# Patient Record
Sex: Male | Born: 1940 | Race: White | Hispanic: No | Marital: Married | Smoking: Never smoker
Health system: Southern US, Community
[De-identification: ages and names within clinical notes are randomized; demographics above are authoritative.]

## PROBLEM LIST (undated history)

## (undated) DIAGNOSIS — K219 Gastro-esophageal reflux disease without esophagitis: Secondary | ICD-10-CM

## (undated) DIAGNOSIS — Z952 Presence of prosthetic heart valve: Secondary | ICD-10-CM

## (undated) DIAGNOSIS — R519 Headache, unspecified: Secondary | ICD-10-CM

## (undated) DIAGNOSIS — Z9889 Other specified postprocedural states: Secondary | ICD-10-CM

## (undated) DIAGNOSIS — I499 Cardiac arrhythmia, unspecified: Secondary | ICD-10-CM

## (undated) DIAGNOSIS — I10 Essential (primary) hypertension: Secondary | ICD-10-CM

## (undated) DIAGNOSIS — I4891 Unspecified atrial fibrillation: Secondary | ICD-10-CM

## (undated) DIAGNOSIS — I2510 Atherosclerotic heart disease of native coronary artery without angina pectoris: Secondary | ICD-10-CM

## (undated) DIAGNOSIS — Z9289 Personal history of other medical treatment: Secondary | ICD-10-CM

## (undated) DIAGNOSIS — I350 Nonrheumatic aortic (valve) stenosis: Secondary | ICD-10-CM

## (undated) DIAGNOSIS — I2119 ST elevation (STEMI) myocardial infarction involving other coronary artery of inferior wall: Secondary | ICD-10-CM

## (undated) DIAGNOSIS — L989 Disorder of the skin and subcutaneous tissue, unspecified: Secondary | ICD-10-CM

## (undated) DIAGNOSIS — E785 Hyperlipidemia, unspecified: Secondary | ICD-10-CM

## (undated) DIAGNOSIS — R51 Headache: Secondary | ICD-10-CM

## (undated) HISTORY — PX: EYE SURGERY: SHX253

## (undated) HISTORY — DX: Gastro-esophageal reflux disease without esophagitis: K21.9

## (undated) HISTORY — PX: CATARACT EXTRACTION W/ INTRAOCULAR LENS IMPLANT: SHX1309

## (undated) HISTORY — DX: ST elevation (STEMI) myocardial infarction involving other coronary artery of inferior wall: I21.19

## (undated) HISTORY — DX: Disorder of the skin and subcutaneous tissue, unspecified: L98.9

## (undated) HISTORY — PX: CHOLECYSTECTOMY: SHX55

## (undated) HISTORY — DX: Atherosclerotic heart disease of native coronary artery without angina pectoris: I25.10

## (undated) HISTORY — DX: Hyperlipidemia, unspecified: E78.5

## (undated) HISTORY — DX: Personal history of other medical treatment: Z92.89

## (undated) HISTORY — DX: Essential (primary) hypertension: I10

## (undated) HISTORY — DX: Other specified postprocedural states: Z98.890

## (undated) HISTORY — DX: Nonrheumatic aortic (valve) stenosis: I35.0

## (undated) HISTORY — PX: TONSILLECTOMY: SUR1361

## (undated) HISTORY — PX: CARDIAC CATHETERIZATION: SHX172

## (undated) HISTORY — DX: Unspecified atrial fibrillation: I48.91

---

## 1978-12-05 DIAGNOSIS — I2119 ST elevation (STEMI) myocardial infarction involving other coronary artery of inferior wall: Secondary | ICD-10-CM

## 1978-12-05 HISTORY — DX: ST elevation (STEMI) myocardial infarction involving other coronary artery of inferior wall: I21.19

## 1996-02-09 DIAGNOSIS — Z951 Presence of aortocoronary bypass graft: Secondary | ICD-10-CM | POA: Insufficient documentation

## 1996-02-09 HISTORY — PX: CORONARY ARTERY BYPASS GRAFT: SHX141

## 2000-09-14 ENCOUNTER — Encounter (INDEPENDENT_AMBULATORY_CARE_PROVIDER_SITE_OTHER): Payer: Self-pay | Admitting: *Deleted

## 2003-12-03 ENCOUNTER — Emergency Department (HOSPITAL_COMMUNITY): Admission: EM | Admit: 2003-12-03 | Discharge: 2003-12-03 | Payer: Self-pay | Admitting: Emergency Medicine

## 2004-10-15 ENCOUNTER — Ambulatory Visit: Payer: Self-pay | Admitting: Internal Medicine

## 2004-10-29 ENCOUNTER — Ambulatory Visit: Payer: Self-pay | Admitting: Cardiology

## 2004-11-02 ENCOUNTER — Ambulatory Visit: Payer: Self-pay

## 2004-11-15 ENCOUNTER — Ambulatory Visit: Payer: Self-pay | Admitting: Cardiology

## 2005-01-18 ENCOUNTER — Ambulatory Visit: Payer: Self-pay | Admitting: Cardiology

## 2005-10-05 ENCOUNTER — Ambulatory Visit: Payer: Self-pay | Admitting: Cardiology

## 2005-10-05 ENCOUNTER — Ambulatory Visit: Payer: Self-pay

## 2005-10-12 ENCOUNTER — Ambulatory Visit: Payer: Self-pay | Admitting: Cardiology

## 2005-10-17 ENCOUNTER — Ambulatory Visit: Payer: Self-pay

## 2005-10-17 ENCOUNTER — Ambulatory Visit: Payer: Self-pay | Admitting: Cardiology

## 2005-10-17 ENCOUNTER — Encounter: Payer: Self-pay | Admitting: Cardiology

## 2005-10-24 ENCOUNTER — Ambulatory Visit: Payer: Self-pay | Admitting: Cardiology

## 2005-11-01 ENCOUNTER — Ambulatory Visit: Payer: Self-pay | Admitting: *Deleted

## 2005-11-08 ENCOUNTER — Ambulatory Visit: Payer: Self-pay | Admitting: Cardiology

## 2005-11-15 ENCOUNTER — Ambulatory Visit: Payer: Self-pay | Admitting: Cardiology

## 2005-11-18 ENCOUNTER — Ambulatory Visit: Payer: Self-pay | Admitting: Cardiology

## 2005-11-18 ENCOUNTER — Inpatient Hospital Stay (HOSPITAL_COMMUNITY): Admission: AD | Admit: 2005-11-18 | Discharge: 2005-11-21 | Payer: Self-pay | Admitting: Cardiology

## 2005-12-02 ENCOUNTER — Ambulatory Visit: Payer: Self-pay | Admitting: Cardiology

## 2005-12-08 ENCOUNTER — Ambulatory Visit: Payer: Self-pay | Admitting: Internal Medicine

## 2005-12-08 ENCOUNTER — Ambulatory Visit: Payer: Self-pay | Admitting: Cardiology

## 2005-12-28 ENCOUNTER — Ambulatory Visit: Payer: Self-pay | Admitting: Cardiology

## 2006-01-11 ENCOUNTER — Ambulatory Visit: Payer: Self-pay | Admitting: Cardiology

## 2006-01-13 ENCOUNTER — Ambulatory Visit: Payer: Self-pay | Admitting: Internal Medicine

## 2006-02-16 ENCOUNTER — Ambulatory Visit: Payer: Self-pay | Admitting: Internal Medicine

## 2006-02-21 ENCOUNTER — Encounter: Payer: Self-pay | Admitting: Internal Medicine

## 2006-02-21 ENCOUNTER — Ambulatory Visit: Payer: Self-pay

## 2006-03-21 ENCOUNTER — Ambulatory Visit: Payer: Self-pay | Admitting: Cardiology

## 2006-04-26 ENCOUNTER — Ambulatory Visit: Payer: Self-pay | Admitting: Internal Medicine

## 2006-05-03 ENCOUNTER — Ambulatory Visit: Payer: Self-pay | Admitting: Internal Medicine

## 2006-05-10 ENCOUNTER — Ambulatory Visit: Payer: Self-pay | Admitting: Internal Medicine

## 2006-09-05 ENCOUNTER — Ambulatory Visit: Payer: Self-pay | Admitting: Internal Medicine

## 2006-09-14 ENCOUNTER — Ambulatory Visit: Payer: Self-pay | Admitting: Cardiology

## 2006-09-14 LAB — CONVERTED CEMR LAB
ALT: 44 units/L — ABNORMAL HIGH (ref 0–40)
AST: 35 units/L (ref 0–37)
Albumin: 3.9 g/dL (ref 3.5–5.2)
Alkaline Phosphatase: 107 units/L (ref 39–117)
BUN: 13 mg/dL (ref 6–23)
Bilirubin, Direct: 0.2 mg/dL (ref 0.0–0.3)
CO2: 30 meq/L (ref 19–32)
Calcium: 9.3 mg/dL (ref 8.4–10.5)
Chloride: 107 meq/L (ref 96–112)
Chol/HDL Ratio, serum: 4
Cholesterol: 149 mg/dL (ref 0–200)
Creatinine, Ser: 1.1 mg/dL (ref 0.4–1.5)
GFR calc non Af Amer: 71 mL/min
Glomerular Filtration Rate, Af Am: 86 mL/min/{1.73_m2}
Glucose, Bld: 101 mg/dL — ABNORMAL HIGH (ref 70–99)
HCT: 45.6 % (ref 39.0–52.0)
HDL: 37.4 mg/dL — ABNORMAL LOW (ref >39.0)
Hemoglobin: 15.4 g/dL (ref 13.0–17.0)
LDL Cholesterol: 92 mg/dL (ref 0–99)
MCHC: 33.8 g/dL (ref 30.0–36.0)
MCV: 97.9 fL (ref 78.0–100.0)
Platelets: 143 10*3/uL — ABNORMAL LOW (ref 150–400)
Potassium: 5.1 meq/L (ref 3.5–5.1)
RBC: 4.65 M/uL (ref 4.22–5.81)
RDW: 12.3 % (ref 11.5–14.6)
Sodium: 141 meq/L (ref 135–145)
TSH: 0.59 microintl units/mL (ref 0.35–5.50)
Total Bilirubin: 0.9 mg/dL (ref 0.3–1.2)
Total Protein: 6.8 g/dL (ref 6.0–8.3)
Triglyceride fasting, serum: 96 mg/dL (ref 0–149)
VLDL: 19 mg/dL (ref 0–40)
WBC: 7.3 10*3/uL (ref 4.5–10.5)

## 2006-09-18 ENCOUNTER — Ambulatory Visit: Payer: Self-pay | Admitting: Cardiology

## 2006-10-11 ENCOUNTER — Ambulatory Visit: Payer: Self-pay

## 2006-11-18 HISTORY — PX: CARDIOVERSION: SHX1299

## 2006-11-20 ENCOUNTER — Ambulatory Visit: Payer: Self-pay | Admitting: Cardiology

## 2006-11-20 LAB — CONVERTED CEMR LAB
ALT: 35 units/L (ref 0–40)
AST: 30 units/L (ref 0–37)
Albumin: 3.7 g/dL (ref 3.5–5.2)
Alkaline Phosphatase: 106 units/L (ref 39–117)
BUN: 11 mg/dL (ref 6–23)
Bilirubin, Direct: 0.2 mg/dL (ref 0.0–0.3)
CO2: 27 meq/L (ref 19–32)
Calcium: 9.2 mg/dL (ref 8.4–10.5)
Chloride: 107 meq/L (ref 96–112)
Chol/HDL Ratio, serum: 3.2
Cholesterol: 139 mg/dL (ref 0–200)
Creatinine, Ser: 1 mg/dL (ref 0.4–1.5)
GFR calc non Af Amer: 79 mL/min
Glomerular Filtration Rate, Af Am: 96 mL/min/{1.73_m2}
Glucose, Bld: 105 mg/dL — ABNORMAL HIGH (ref 70–99)
HCT: 44.6 % (ref 39.0–52.0)
HDL: 42.8 mg/dL (ref >39.0)
Hemoglobin: 15 g/dL (ref 13.0–17.0)
LDL Cholesterol: 83 mg/dL (ref 0–99)
MCHC: 33.5 g/dL (ref 30.0–36.0)
MCV: 99.2 fL (ref 78.0–100.0)
Platelets: 152 10*3/uL (ref 150–400)
Potassium: 4.5 meq/L (ref 3.5–5.1)
RBC: 4.5 M/uL (ref 4.22–5.81)
RDW: 12.6 % (ref 11.5–14.6)
Sodium: 140 meq/L (ref 135–145)
TSH: 1.1 microintl units/mL (ref 0.35–5.50)
Total Bilirubin: 1.1 mg/dL (ref 0.3–1.2)
Total Protein: 6.6 g/dL (ref 6.0–8.3)
Triglyceride fasting, serum: 67 mg/dL (ref 0–149)
VLDL: 13 mg/dL (ref 0–40)
WBC: 6.6 10*3/uL (ref 4.5–10.5)

## 2007-02-06 ENCOUNTER — Ambulatory Visit: Payer: Self-pay | Admitting: Cardiology

## 2007-03-07 ENCOUNTER — Ambulatory Visit: Payer: Self-pay | Admitting: Cardiology

## 2007-04-05 ENCOUNTER — Encounter: Payer: Self-pay | Admitting: Internal Medicine

## 2007-04-05 ENCOUNTER — Ambulatory Visit: Payer: Self-pay

## 2007-04-09 ENCOUNTER — Ambulatory Visit: Payer: Self-pay | Admitting: Pulmonary Disease

## 2007-04-13 ENCOUNTER — Ambulatory Visit: Payer: Self-pay | Admitting: Internal Medicine

## 2007-04-24 ENCOUNTER — Ambulatory Visit: Payer: Self-pay | Admitting: Cardiology

## 2007-04-30 ENCOUNTER — Ambulatory Visit (HOSPITAL_BASED_OUTPATIENT_CLINIC_OR_DEPARTMENT_OTHER): Admission: RE | Admit: 2007-04-30 | Discharge: 2007-04-30 | Payer: Self-pay | Admitting: Pulmonary Disease

## 2007-04-30 ENCOUNTER — Ambulatory Visit: Payer: Self-pay | Admitting: Pulmonary Disease

## 2007-05-10 ENCOUNTER — Ambulatory Visit: Payer: Self-pay | Admitting: Cardiology

## 2007-05-22 ENCOUNTER — Ambulatory Visit: Payer: Self-pay | Admitting: Pulmonary Disease

## 2007-05-31 ENCOUNTER — Ambulatory Visit: Payer: Self-pay | Admitting: Cardiology

## 2007-05-31 LAB — CONVERTED CEMR LAB
BUN: 13 mg/dL (ref 6–23)
CO2: 28 meq/L (ref 19–32)
Calcium: 9 mg/dL (ref 8.4–10.5)
Chloride: 109 meq/L (ref 96–112)
Creatinine, Ser: 0.9 mg/dL (ref 0.4–1.5)
GFR calc Af Amer: 108 mL/min
GFR calc non Af Amer: 89 mL/min
Glucose, Bld: 147 mg/dL — ABNORMAL HIGH (ref 70–99)
Potassium: 4.3 meq/L (ref 3.5–5.1)
Sodium: 143 meq/L (ref 135–145)

## 2007-08-21 ENCOUNTER — Encounter: Payer: Self-pay | Admitting: Internal Medicine

## 2007-09-06 ENCOUNTER — Ambulatory Visit: Payer: Self-pay | Admitting: Cardiology

## 2007-09-07 ENCOUNTER — Encounter: Payer: Self-pay | Admitting: *Deleted

## 2007-09-07 DIAGNOSIS — K219 Gastro-esophageal reflux disease without esophagitis: Secondary | ICD-10-CM | POA: Insufficient documentation

## 2007-09-07 DIAGNOSIS — I359 Nonrheumatic aortic valve disorder, unspecified: Secondary | ICD-10-CM

## 2007-09-07 DIAGNOSIS — I10 Essential (primary) hypertension: Secondary | ICD-10-CM | POA: Insufficient documentation

## 2007-09-07 DIAGNOSIS — I482 Chronic atrial fibrillation, unspecified: Secondary | ICD-10-CM | POA: Insufficient documentation

## 2007-09-25 ENCOUNTER — Encounter: Payer: Self-pay | Admitting: Internal Medicine

## 2007-10-17 ENCOUNTER — Ambulatory Visit: Payer: Self-pay

## 2007-10-17 LAB — CONVERTED CEMR LAB
ALT: 35 units/L (ref 0–53)
AST: 31 units/L (ref 0–37)
Albumin: 3.8 g/dL (ref 3.5–5.2)
Alkaline Phosphatase: 97 units/L (ref 39–117)
Bilirubin, Direct: 0.1 mg/dL (ref 0.0–0.3)
Cholesterol: 125 mg/dL (ref 0–200)
HDL: 45 mg/dL (ref >39.0)
LDL Cholesterol: 67 mg/dL (ref 0–99)
Total Bilirubin: 0.9 mg/dL (ref 0.3–1.2)
Total CHOL/HDL Ratio: 2.8
Total Protein: 6.8 g/dL (ref 6.0–8.3)
Triglycerides: 67 mg/dL (ref 0–149)
VLDL: 13 mg/dL (ref 0–40)

## 2007-10-19 ENCOUNTER — Ambulatory Visit: Payer: Self-pay | Admitting: Internal Medicine

## 2007-10-24 ENCOUNTER — Ambulatory Visit: Payer: Self-pay | Admitting: Internal Medicine

## 2007-10-24 ENCOUNTER — Ambulatory Visit: Payer: Self-pay | Admitting: Cardiology

## 2007-10-30 ENCOUNTER — Ambulatory Visit: Payer: Self-pay | Admitting: Internal Medicine

## 2007-10-30 ENCOUNTER — Telehealth: Payer: Self-pay | Admitting: Internal Medicine

## 2007-11-04 ENCOUNTER — Encounter: Payer: Self-pay | Admitting: Internal Medicine

## 2007-11-09 ENCOUNTER — Telehealth: Payer: Self-pay | Admitting: Internal Medicine

## 2007-11-15 ENCOUNTER — Ambulatory Visit: Payer: Self-pay | Admitting: Cardiology

## 2007-11-22 ENCOUNTER — Ambulatory Visit: Payer: Self-pay | Admitting: Cardiology

## 2007-12-04 ENCOUNTER — Ambulatory Visit: Payer: Self-pay | Admitting: Internal Medicine

## 2007-12-13 ENCOUNTER — Ambulatory Visit: Payer: Self-pay | Admitting: Internal Medicine

## 2008-01-03 ENCOUNTER — Ambulatory Visit: Payer: Self-pay | Admitting: Cardiology

## 2008-01-17 ENCOUNTER — Ambulatory Visit: Payer: Self-pay | Admitting: Cardiology

## 2008-02-14 ENCOUNTER — Ambulatory Visit: Payer: Self-pay | Admitting: Cardiology

## 2008-03-06 ENCOUNTER — Ambulatory Visit: Payer: Self-pay | Admitting: Internal Medicine

## 2008-03-20 ENCOUNTER — Ambulatory Visit: Payer: Self-pay | Admitting: Cardiology

## 2008-04-07 ENCOUNTER — Ambulatory Visit: Payer: Self-pay | Admitting: Internal Medicine

## 2008-04-07 ENCOUNTER — Ambulatory Visit: Payer: Self-pay | Admitting: Cardiology

## 2008-04-07 ENCOUNTER — Ambulatory Visit: Payer: Self-pay

## 2008-04-07 ENCOUNTER — Encounter: Payer: Self-pay | Admitting: Cardiology

## 2008-04-07 LAB — CONVERTED CEMR LAB
ALT: 37 units/L (ref 0–53)
AST: 32 units/L (ref 0–37)
Albumin: 3.8 g/dL (ref 3.5–5.2)
Alkaline Phosphatase: 115 units/L (ref 39–117)
BUN: 15 mg/dL (ref 6–23)
Bilirubin, Direct: 0.1 mg/dL (ref 0.0–0.3)
CO2: 31 meq/L (ref 19–32)
Calcium: 9.1 mg/dL (ref 8.4–10.5)
Chloride: 106 meq/L (ref 96–112)
Cholesterol: 141 mg/dL (ref 0–200)
Creatinine, Ser: 1 mg/dL (ref 0.4–1.5)
GFR calc Af Amer: 95 mL/min
GFR calc non Af Amer: 79 mL/min
Glucose, Bld: 99 mg/dL (ref 70–99)
HDL: 43.3 mg/dL (ref >39.0)
LDL Cholesterol: 83 mg/dL (ref 0–99)
Potassium: 4.1 meq/L (ref 3.5–5.1)
Sodium: 141 meq/L (ref 135–145)
TSH: 0.92 microintl units/mL (ref 0.35–5.50)
Total Bilirubin: 0.8 mg/dL (ref 0.3–1.2)
Total CHOL/HDL Ratio: 3.3
Total Protein: 6.8 g/dL (ref 6.0–8.3)
Triglycerides: 73 mg/dL (ref 0–149)
VLDL: 15 mg/dL (ref 0–40)

## 2008-04-16 ENCOUNTER — Ambulatory Visit: Payer: Self-pay | Admitting: Cardiology

## 2008-04-21 ENCOUNTER — Encounter: Payer: Self-pay | Admitting: Internal Medicine

## 2008-05-05 ENCOUNTER — Ambulatory Visit: Payer: Self-pay | Admitting: Cardiovascular Disease

## 2008-06-02 ENCOUNTER — Ambulatory Visit: Payer: Self-pay | Admitting: Cardiology

## 2008-06-30 ENCOUNTER — Ambulatory Visit: Payer: Self-pay | Admitting: Cardiovascular Disease

## 2008-07-29 ENCOUNTER — Ambulatory Visit: Payer: Self-pay | Admitting: Cardiology

## 2008-08-21 ENCOUNTER — Encounter: Payer: Self-pay | Admitting: Internal Medicine

## 2008-08-26 ENCOUNTER — Ambulatory Visit: Payer: Self-pay | Admitting: Cardiology

## 2008-09-16 ENCOUNTER — Ambulatory Visit: Payer: Self-pay | Admitting: Internal Medicine

## 2008-09-26 ENCOUNTER — Ambulatory Visit: Payer: Self-pay | Admitting: Internal Medicine

## 2008-10-10 ENCOUNTER — Ambulatory Visit: Payer: Self-pay | Admitting: Cardiology

## 2008-10-10 ENCOUNTER — Ambulatory Visit: Payer: Self-pay

## 2008-10-10 LAB — CONVERTED CEMR LAB
ALT: 41 units/L (ref 0–53)
AST: 34 units/L (ref 0–37)
Albumin: 3.9 g/dL (ref 3.5–5.2)
Alkaline Phosphatase: 130 units/L — ABNORMAL HIGH (ref 39–117)
BUN: 14 mg/dL (ref 6–23)
Basophils Absolute: 0.2 10*3/uL — ABNORMAL HIGH (ref 0.0–0.1)
Basophils Relative: 2.8 % (ref 0.0–3.0)
Bilirubin, Direct: 0.2 mg/dL (ref 0.0–0.3)
CO2: 32 meq/L (ref 19–32)
Calcium: 9.4 mg/dL (ref 8.4–10.5)
Chloride: 105 meq/L (ref 96–112)
Cholesterol: 151 mg/dL (ref 0–200)
Creatinine, Ser: 0.9 mg/dL (ref 0.4–1.5)
Eosinophils Absolute: 0.1 10*3/uL (ref 0.0–0.7)
Eosinophils Relative: 1.7 % (ref 0.0–5.0)
GFR calc Af Amer: 108 mL/min
GFR calc non Af Amer: 89 mL/min
Glucose, Bld: 99 mg/dL (ref 70–99)
HCT: 45.5 % (ref 39.0–52.0)
HDL: 49.5 mg/dL (ref >39.0)
Hemoglobin: 15.5 g/dL (ref 13.0–17.0)
LDL Cholesterol: 86 mg/dL (ref 0–99)
Lymphocytes Relative: 20 % (ref 12.0–46.0)
MCHC: 34.1 g/dL (ref 30.0–36.0)
MCV: 100.3 fL — ABNORMAL HIGH (ref 78.0–100.0)
Monocytes Absolute: 0.7 10*3/uL (ref 0.1–1.0)
Monocytes Relative: 10.9 % (ref 3.0–12.0)
Neutro Abs: 4 10*3/uL (ref 1.4–7.7)
Neutrophils Relative %: 64.6 % (ref 43.0–77.0)
Platelets: 109 10*3/uL — ABNORMAL LOW (ref 150–400)
Potassium: 5 meq/L (ref 3.5–5.1)
RBC: 4.54 M/uL (ref 4.22–5.81)
RDW: 12.9 % (ref 11.5–14.6)
Sodium: 141 meq/L (ref 135–145)
TSH: 1.29 microintl units/mL (ref 0.35–5.50)
Total Bilirubin: 1.1 mg/dL (ref 0.3–1.2)
Total CHOL/HDL Ratio: 3.1
Total Protein: 7.1 g/dL (ref 6.0–8.3)
Triglycerides: 79 mg/dL (ref 0–149)
VLDL: 16 mg/dL (ref 0–40)
WBC: 6.2 10*3/uL (ref 4.5–10.5)

## 2008-10-16 ENCOUNTER — Ambulatory Visit: Payer: Self-pay | Admitting: Cardiology

## 2008-10-16 DIAGNOSIS — I2510 Atherosclerotic heart disease of native coronary artery without angina pectoris: Secondary | ICD-10-CM | POA: Insufficient documentation

## 2008-10-16 DIAGNOSIS — I25810 Atherosclerosis of coronary artery bypass graft(s) without angina pectoris: Secondary | ICD-10-CM

## 2008-11-04 ENCOUNTER — Ambulatory Visit: Payer: Self-pay | Admitting: Cardiovascular Disease

## 2008-11-25 ENCOUNTER — Ambulatory Visit: Payer: Self-pay | Admitting: Internal Medicine

## 2008-12-16 ENCOUNTER — Ambulatory Visit: Payer: Self-pay | Admitting: Cardiovascular Disease

## 2008-12-22 ENCOUNTER — Ambulatory Visit: Payer: Self-pay | Admitting: Internal Medicine

## 2008-12-29 ENCOUNTER — Telehealth: Payer: Self-pay | Admitting: Internal Medicine

## 2009-01-02 ENCOUNTER — Ambulatory Visit: Payer: Self-pay | Admitting: Cardiovascular Disease

## 2009-01-06 ENCOUNTER — Ambulatory Visit: Payer: Self-pay | Admitting: Cardiology

## 2009-01-07 ENCOUNTER — Telehealth: Payer: Self-pay | Admitting: Internal Medicine

## 2009-01-27 ENCOUNTER — Ambulatory Visit: Payer: Self-pay | Admitting: Cardiovascular Disease

## 2009-01-28 ENCOUNTER — Telehealth: Payer: Self-pay | Admitting: Internal Medicine

## 2009-02-24 ENCOUNTER — Ambulatory Visit: Payer: Self-pay | Admitting: Cardiovascular Disease

## 2009-03-17 ENCOUNTER — Ambulatory Visit: Payer: Self-pay | Admitting: Cardiology

## 2009-03-18 ENCOUNTER — Telehealth: Payer: Self-pay | Admitting: Cardiology

## 2009-03-31 ENCOUNTER — Ambulatory Visit: Payer: Self-pay | Admitting: Cardiology

## 2009-04-10 ENCOUNTER — Ambulatory Visit: Payer: Self-pay | Admitting: Cardiovascular Disease

## 2009-04-10 ENCOUNTER — Encounter: Payer: Self-pay | Admitting: Cardiology

## 2009-04-10 ENCOUNTER — Ambulatory Visit: Payer: Self-pay

## 2009-04-13 ENCOUNTER — Ambulatory Visit: Payer: Self-pay | Admitting: Cardiology

## 2009-04-13 LAB — CONVERTED CEMR LAB
ALT: 40 units/L (ref 0–53)
AST: 26 units/L (ref 0–37)
Albumin: 3.4 g/dL — ABNORMAL LOW (ref 3.5–5.2)
Alkaline Phosphatase: 126 units/L — ABNORMAL HIGH (ref 39–117)
BUN: 13 mg/dL (ref 6–23)
Basophils Absolute: 0 10*3/uL (ref 0.0–0.1)
Basophils Relative: 0.6 % (ref 0.0–3.0)
Bilirubin, Direct: 0.2 mg/dL (ref 0.0–0.3)
CO2: 27 meq/L (ref 19–32)
Calcium: 9.1 mg/dL (ref 8.4–10.5)
Chloride: 110 meq/L (ref 96–112)
Cholesterol: 116 mg/dL (ref 0–200)
Creatinine, Ser: 0.7 mg/dL (ref 0.4–1.5)
Eosinophils Absolute: 0.1 10*3/uL (ref 0.0–0.7)
Eosinophils Relative: 1.9 % (ref 0.0–5.0)
GFR calc non Af Amer: 118.44 mL/min (ref >60)
Glucose, Bld: 91 mg/dL (ref 70–99)
HCT: 39 % (ref 39.0–52.0)
HDL: 39 mg/dL — ABNORMAL LOW (ref >39.00)
Hemoglobin: 13.5 g/dL (ref 13.0–17.0)
LDL Cholesterol: 66 mg/dL (ref 0–99)
Lymphocytes Relative: 20.7 % (ref 12.0–46.0)
Lymphs Abs: 1.4 10*3/uL (ref 0.7–4.0)
MCHC: 34.5 g/dL (ref 30.0–36.0)
MCV: 95.6 fL (ref 78.0–100.0)
Monocytes Absolute: 0.7 10*3/uL (ref 0.1–1.0)
Monocytes Relative: 10.6 % (ref 3.0–12.0)
Neutro Abs: 4.7 10*3/uL (ref 1.4–7.7)
Neutrophils Relative %: 66.2 % (ref 43.0–77.0)
Platelets: 128 10*3/uL — ABNORMAL LOW (ref 150.0–400.0)
Potassium: 3.9 meq/L (ref 3.5–5.1)
RBC: 4.08 M/uL — ABNORMAL LOW (ref 4.22–5.81)
RDW: 12.1 % (ref 11.5–14.6)
Sodium: 142 meq/L (ref 135–145)
TSH: 0.07 microintl units/mL — ABNORMAL LOW (ref 0.35–5.50)
Total Bilirubin: 1.1 mg/dL (ref 0.3–1.2)
Total CHOL/HDL Ratio: 3
Total Protein: 6.2 g/dL (ref 6.0–8.3)
Triglycerides: 54 mg/dL (ref 0.0–149.0)
VLDL: 10.8 mg/dL (ref 0.0–40.0)
WBC: 6.9 10*3/uL (ref 4.5–10.5)

## 2009-04-22 ENCOUNTER — Ambulatory Visit: Payer: Self-pay | Admitting: Cardiology

## 2009-04-22 DIAGNOSIS — E785 Hyperlipidemia, unspecified: Secondary | ICD-10-CM

## 2009-04-22 DIAGNOSIS — I255 Ischemic cardiomyopathy: Secondary | ICD-10-CM | POA: Insufficient documentation

## 2009-04-22 DIAGNOSIS — E782 Mixed hyperlipidemia: Secondary | ICD-10-CM | POA: Insufficient documentation

## 2009-05-05 ENCOUNTER — Encounter: Payer: Self-pay | Admitting: *Deleted

## 2009-05-06 ENCOUNTER — Ambulatory Visit: Payer: Self-pay | Admitting: Cardiology

## 2009-05-06 LAB — CONVERTED CEMR LAB
POC INR: 2.3
Protime: 18.7

## 2009-05-22 ENCOUNTER — Telehealth: Payer: Self-pay | Admitting: Internal Medicine

## 2009-05-22 ENCOUNTER — Ambulatory Visit: Payer: Self-pay | Admitting: Internal Medicine

## 2009-05-22 DIAGNOSIS — G4700 Insomnia, unspecified: Secondary | ICD-10-CM

## 2009-05-22 DIAGNOSIS — E0590 Thyrotoxicosis, unspecified without thyrotoxic crisis or storm: Secondary | ICD-10-CM | POA: Insufficient documentation

## 2009-05-22 LAB — CONVERTED CEMR LAB
Free T4: 1.4 ng/dL (ref 0.6–1.6)
T3 Uptake Ratio: 41.1 % — ABNORMAL HIGH (ref 22.5–37.0)
T3, Free: 2.6 pg/mL (ref 2.3–4.2)
T4, Total: 10.9 ug/dL (ref 5.0–12.5)
TSH: 0.03 microintl units/mL — ABNORMAL LOW (ref 0.35–5.50)

## 2009-05-27 ENCOUNTER — Ambulatory Visit: Payer: Self-pay | Admitting: Internal Medicine

## 2009-05-27 ENCOUNTER — Encounter: Payer: Self-pay | Admitting: Internal Medicine

## 2009-05-27 LAB — CONVERTED CEMR LAB
POC INR: 1.5
Prothrombin Time: 15.2 s

## 2009-06-10 ENCOUNTER — Encounter: Payer: Self-pay | Admitting: *Deleted

## 2009-06-17 ENCOUNTER — Ambulatory Visit: Payer: Self-pay | Admitting: Internal Medicine

## 2009-06-17 LAB — CONVERTED CEMR LAB
POC INR: 1.1
Prothrombin Time: 13 s

## 2009-06-25 ENCOUNTER — Ambulatory Visit: Payer: Self-pay | Admitting: Cardiovascular Disease

## 2009-07-09 ENCOUNTER — Ambulatory Visit: Payer: Self-pay | Admitting: Cardiology

## 2009-07-09 LAB — CONVERTED CEMR LAB
POC INR: 2.7
Prothrombin Time: 19.9 s

## 2009-07-30 ENCOUNTER — Ambulatory Visit: Payer: Self-pay | Admitting: Cardiovascular Disease

## 2009-07-30 LAB — CONVERTED CEMR LAB: POC INR: 4

## 2009-08-13 ENCOUNTER — Ambulatory Visit: Payer: Self-pay | Admitting: Cardiovascular Disease

## 2009-08-13 LAB — CONVERTED CEMR LAB: POC INR: 3.1

## 2009-08-27 ENCOUNTER — Ambulatory Visit: Payer: Self-pay | Admitting: Internal Medicine

## 2009-08-27 ENCOUNTER — Telehealth: Payer: Self-pay | Admitting: Cardiology

## 2009-08-27 LAB — CONVERTED CEMR LAB: POC INR: 1.9

## 2009-09-10 ENCOUNTER — Ambulatory Visit: Payer: Self-pay | Admitting: Internal Medicine

## 2009-09-10 LAB — CONVERTED CEMR LAB: POC INR: 2.8

## 2009-10-13 ENCOUNTER — Ambulatory Visit: Payer: Self-pay | Admitting: Cardiology

## 2009-10-13 ENCOUNTER — Encounter: Payer: Self-pay | Admitting: Cardiology

## 2009-10-13 ENCOUNTER — Ambulatory Visit: Payer: Self-pay

## 2009-10-13 ENCOUNTER — Ambulatory Visit (HOSPITAL_COMMUNITY): Admission: RE | Admit: 2009-10-13 | Discharge: 2009-10-13 | Payer: Self-pay | Admitting: Cardiology

## 2009-10-13 DIAGNOSIS — I2510 Atherosclerotic heart disease of native coronary artery without angina pectoris: Secondary | ICD-10-CM

## 2009-10-16 ENCOUNTER — Encounter: Payer: Self-pay | Admitting: Cardiology

## 2009-10-19 ENCOUNTER — Ambulatory Visit: Payer: Self-pay | Admitting: Cardiology

## 2009-10-19 LAB — CONVERTED CEMR LAB
ALT: 37 units/L (ref 0–53)
AST: 34 units/L (ref 0–37)
Albumin: 3.8 g/dL (ref 3.5–5.2)
Alkaline Phosphatase: 120 units/L — ABNORMAL HIGH (ref 39–117)
BUN: 8 mg/dL (ref 6–23)
Basophils Absolute: 0 10*3/uL (ref 0.0–0.1)
Basophils Relative: 0.5 % (ref 0.0–3.0)
Bilirubin, Direct: 0.2 mg/dL (ref 0.0–0.3)
CO2: 29 meq/L (ref 19–32)
Calcium: 8.8 mg/dL (ref 8.4–10.5)
Chloride: 106 meq/L (ref 96–112)
Cholesterol: 142 mg/dL (ref 0–200)
Creatinine, Ser: 0.8 mg/dL (ref 0.4–1.5)
Eosinophils Absolute: 0.1 10*3/uL (ref 0.0–0.7)
Eosinophils Relative: 1.5 % (ref 0.0–5.0)
GFR calc non Af Amer: 101.38 mL/min (ref >60)
Glucose, Bld: 91 mg/dL (ref 70–99)
HCT: 43.9 % (ref 39.0–52.0)
HDL: 44.9 mg/dL (ref >39.00)
Hemoglobin: 14.6 g/dL (ref 13.0–17.0)
LDL Cholesterol: 80 mg/dL (ref 0–99)
Lymphocytes Relative: 19 % (ref 12.0–46.0)
Lymphs Abs: 1.3 10*3/uL (ref 0.7–4.0)
MCHC: 33.4 g/dL (ref 30.0–36.0)
MCV: 102 fL — ABNORMAL HIGH (ref 78.0–100.0)
Monocytes Absolute: 0.7 10*3/uL (ref 0.1–1.0)
Monocytes Relative: 10.7 % (ref 3.0–12.0)
Neutro Abs: 4.8 10*3/uL (ref 1.4–7.7)
Neutrophils Relative %: 68.3 % (ref 43.0–77.0)
Platelets: 119 10*3/uL — ABNORMAL LOW (ref 150.0–400.0)
Potassium: 4.2 meq/L (ref 3.5–5.1)
RBC: 4.3 M/uL (ref 4.22–5.81)
RDW: 13.4 % (ref 11.5–14.6)
Sodium: 143 meq/L (ref 135–145)
Total Bilirubin: 1.2 mg/dL (ref 0.3–1.2)
Total CHOL/HDL Ratio: 3
Total Protein: 7.1 g/dL (ref 6.0–8.3)
Triglycerides: 88 mg/dL (ref 0.0–149.0)
VLDL: 17.6 mg/dL (ref 0.0–40.0)
WBC: 6.9 10*3/uL (ref 4.5–10.5)

## 2009-10-20 LAB — CONVERTED CEMR LAB
Free T4: 0.9 ng/dL (ref 0.6–1.6)
T3 Uptake Ratio: 38.9 % — ABNORMAL HIGH (ref 22.5–37.0)
T3, Free: 2.8 pg/mL (ref 2.3–4.2)
T4, Total: 8.5 ug/dL (ref 5.0–12.5)
TSH: 3.07 microintl units/mL (ref 0.35–5.50)

## 2009-10-22 ENCOUNTER — Telehealth: Payer: Self-pay | Admitting: Cardiology

## 2009-10-26 ENCOUNTER — Telehealth: Payer: Self-pay | Admitting: Cardiology

## 2009-10-28 ENCOUNTER — Telehealth: Payer: Self-pay | Admitting: Cardiology

## 2009-11-10 ENCOUNTER — Ambulatory Visit: Payer: Self-pay | Admitting: Cardiovascular Disease

## 2009-11-10 LAB — CONVERTED CEMR LAB: POC INR: 2.1

## 2009-11-23 ENCOUNTER — Telehealth (INDEPENDENT_AMBULATORY_CARE_PROVIDER_SITE_OTHER): Payer: Self-pay | Admitting: *Deleted

## 2009-12-08 ENCOUNTER — Ambulatory Visit: Payer: Self-pay | Admitting: Cardiology

## 2009-12-08 LAB — CONVERTED CEMR LAB: POC INR: 2.2

## 2009-12-10 ENCOUNTER — Ambulatory Visit: Payer: Self-pay | Admitting: Internal Medicine

## 2009-12-30 ENCOUNTER — Telehealth: Payer: Self-pay | Admitting: Internal Medicine

## 2009-12-31 ENCOUNTER — Ambulatory Visit: Payer: Self-pay | Admitting: Internal Medicine

## 2010-01-13 ENCOUNTER — Ambulatory Visit: Payer: Self-pay

## 2010-01-13 ENCOUNTER — Telehealth: Payer: Self-pay | Admitting: Internal Medicine

## 2010-01-13 ENCOUNTER — Ambulatory Visit: Payer: Self-pay | Admitting: Cardiology

## 2010-01-13 LAB — CONVERTED CEMR LAB: POC INR: 1.9

## 2010-01-14 ENCOUNTER — Ambulatory Visit: Payer: Self-pay | Admitting: Internal Medicine

## 2010-01-29 ENCOUNTER — Ambulatory Visit: Payer: Self-pay | Admitting: Internal Medicine

## 2010-01-29 ENCOUNTER — Emergency Department (HOSPITAL_COMMUNITY): Admission: EM | Admit: 2010-01-29 | Discharge: 2010-01-30 | Payer: Self-pay | Admitting: Emergency Medicine

## 2010-01-29 ENCOUNTER — Telehealth: Payer: Self-pay | Admitting: Internal Medicine

## 2010-01-29 DIAGNOSIS — R1084 Generalized abdominal pain: Secondary | ICD-10-CM | POA: Insufficient documentation

## 2010-01-29 LAB — CONVERTED CEMR LAB
ALT: 213 units/L — ABNORMAL HIGH (ref 0–53)
AST: 255 units/L — ABNORMAL HIGH (ref 0–37)
Albumin: 3.8 g/dL (ref 3.5–5.2)
Alkaline Phosphatase: 162 units/L — ABNORMAL HIGH (ref 39–117)
Amylase: 48 units/L (ref 27–131)
BUN: 10 mg/dL (ref 6–23)
Basophils Absolute: 0.1 10*3/uL (ref 0.0–0.1)
Basophils Relative: 0.8 % (ref 0.0–3.0)
Bilirubin Urine: NEGATIVE
Bilirubin, Direct: 0.4 mg/dL — ABNORMAL HIGH (ref 0.0–0.3)
CO2: 28 meq/L (ref 19–32)
Calcium: 8.9 mg/dL (ref 8.4–10.5)
Chloride: 105 meq/L (ref 96–112)
Creatinine, Ser: 0.8 mg/dL (ref 0.4–1.5)
Eosinophils Absolute: 0 10*3/uL (ref 0.0–0.7)
Eosinophils Relative: 0.1 % (ref 0.0–5.0)
GFR calc non Af Amer: 101.29 mL/min (ref >60)
Glucose, Bld: 110 mg/dL — ABNORMAL HIGH (ref 70–99)
HCT: 39.6 % (ref 39.0–52.0)
Hemoglobin: 13.1 g/dL (ref 13.0–17.0)
Leukocytes, UA: NEGATIVE
Lipase: 15 units/L (ref 11.0–59.0)
Lymphocytes Relative: 4.8 % — ABNORMAL LOW (ref 12.0–46.0)
Lymphs Abs: 0.5 10*3/uL — ABNORMAL LOW (ref 0.7–4.0)
MCHC: 33 g/dL (ref 30.0–36.0)
MCV: 101.6 fL — ABNORMAL HIGH (ref 78.0–100.0)
Monocytes Absolute: 0.3 10*3/uL (ref 0.1–1.0)
Monocytes Relative: 2.8 % — ABNORMAL LOW (ref 3.0–12.0)
Neutro Abs: 8.7 10*3/uL — ABNORMAL HIGH (ref 1.4–7.7)
Neutrophils Relative %: 91.5 % — ABNORMAL HIGH (ref 43.0–77.0)
Nitrite: NEGATIVE
Platelets: 114 10*3/uL — ABNORMAL LOW (ref 150.0–400.0)
Potassium: 3.8 meq/L (ref 3.5–5.1)
RBC: 3.9 M/uL — ABNORMAL LOW (ref 4.22–5.81)
RDW: 13.4 % (ref 11.5–14.6)
Sodium: 138 meq/L (ref 135–145)
Specific Gravity, Urine: 1.025 (ref 1.000–1.030)
Total Bilirubin: 1.4 mg/dL — ABNORMAL HIGH (ref 0.3–1.2)
Total Protein, Urine: NEGATIVE mg/dL
Total Protein: 7.2 g/dL (ref 6.0–8.3)
Urine Glucose: NEGATIVE mg/dL
Urobilinogen, UA: 0.2 (ref 0.0–1.0)
WBC: 9.6 10*3/uL (ref 4.5–10.5)
pH: 5.5 (ref 5.0–8.0)

## 2010-01-30 ENCOUNTER — Encounter (INDEPENDENT_AMBULATORY_CARE_PROVIDER_SITE_OTHER): Payer: Self-pay | Admitting: *Deleted

## 2010-02-01 ENCOUNTER — Ambulatory Visit: Payer: Self-pay | Admitting: Internal Medicine

## 2010-02-01 ENCOUNTER — Telehealth: Payer: Self-pay | Admitting: Internal Medicine

## 2010-02-01 DIAGNOSIS — K812 Acute cholecystitis with chronic cholecystitis: Secondary | ICD-10-CM | POA: Insufficient documentation

## 2010-02-01 LAB — CONVERTED CEMR LAB
INR: 1.4 — ABNORMAL HIGH (ref 0.8–1.0)
Prothrombin Time: 14.9 s — ABNORMAL HIGH (ref 9.1–11.7)

## 2010-02-02 ENCOUNTER — Telehealth: Payer: Self-pay | Admitting: Cardiology

## 2010-02-08 ENCOUNTER — Encounter: Payer: Self-pay | Admitting: Cardiology

## 2010-02-09 ENCOUNTER — Encounter (INDEPENDENT_AMBULATORY_CARE_PROVIDER_SITE_OTHER): Payer: Self-pay | Admitting: *Deleted

## 2010-02-09 ENCOUNTER — Ambulatory Visit: Payer: Self-pay | Admitting: Cardiology

## 2010-02-09 ENCOUNTER — Encounter (INDEPENDENT_AMBULATORY_CARE_PROVIDER_SITE_OTHER): Payer: Self-pay | Admitting: Cardiology

## 2010-02-09 LAB — CONVERTED CEMR LAB: POC INR: 2.2

## 2010-02-17 ENCOUNTER — Telehealth (INDEPENDENT_AMBULATORY_CARE_PROVIDER_SITE_OTHER): Payer: Self-pay | Admitting: *Deleted

## 2010-02-22 ENCOUNTER — Encounter (INDEPENDENT_AMBULATORY_CARE_PROVIDER_SITE_OTHER): Payer: Self-pay | Admitting: General Surgery

## 2010-02-22 ENCOUNTER — Ambulatory Visit (HOSPITAL_COMMUNITY): Admission: RE | Admit: 2010-02-22 | Discharge: 2010-02-23 | Payer: Self-pay | Admitting: General Surgery

## 2010-03-01 ENCOUNTER — Ambulatory Visit: Payer: Self-pay | Admitting: Cardiovascular Disease

## 2010-03-01 LAB — CONVERTED CEMR LAB: POC INR: 1.8

## 2010-03-03 ENCOUNTER — Telehealth: Payer: Self-pay | Admitting: Internal Medicine

## 2010-03-05 ENCOUNTER — Telehealth: Payer: Self-pay | Admitting: Internal Medicine

## 2010-03-16 ENCOUNTER — Ambulatory Visit: Payer: Self-pay | Admitting: Cardiovascular Disease

## 2010-03-16 LAB — CONVERTED CEMR LAB: POC INR: 2.1

## 2010-03-30 ENCOUNTER — Ambulatory Visit: Payer: Self-pay | Admitting: Internal Medicine

## 2010-04-13 ENCOUNTER — Ambulatory Visit: Payer: Self-pay | Admitting: Cardiovascular Disease

## 2010-04-13 LAB — CONVERTED CEMR LAB: POC INR: 2.2

## 2010-05-11 ENCOUNTER — Ambulatory Visit: Payer: Self-pay | Admitting: Cardiology

## 2010-05-11 LAB — CONVERTED CEMR LAB: POC INR: 1.6

## 2010-06-01 ENCOUNTER — Ambulatory Visit: Payer: Self-pay | Admitting: Internal Medicine

## 2010-06-01 LAB — CONVERTED CEMR LAB: POC INR: 2.5

## 2010-06-18 ENCOUNTER — Telehealth: Payer: Self-pay | Admitting: Internal Medicine

## 2010-06-21 ENCOUNTER — Ambulatory Visit: Payer: Self-pay | Admitting: Internal Medicine

## 2010-06-24 ENCOUNTER — Telehealth: Payer: Self-pay | Admitting: Cardiology

## 2010-06-28 ENCOUNTER — Ambulatory Visit: Payer: Self-pay | Admitting: Cardiology

## 2010-06-29 ENCOUNTER — Ambulatory Visit: Payer: Self-pay | Admitting: Cardiology

## 2010-06-29 LAB — CONVERTED CEMR LAB: POC INR: 1.5

## 2010-07-06 ENCOUNTER — Ambulatory Visit: Payer: Self-pay | Admitting: Cardiology

## 2010-07-06 LAB — CONVERTED CEMR LAB
ALT: 28 units/L (ref 0–53)
AST: 25 units/L (ref 0–37)
Albumin: 3.9 g/dL (ref 3.5–5.2)
Alkaline Phosphatase: 120 units/L — ABNORMAL HIGH (ref 39–117)
BUN: 19 mg/dL (ref 6–23)
Basophils Absolute: 0 10*3/uL (ref 0.0–0.1)
Basophils Relative: 0.5 % (ref 0.0–3.0)
Bilirubin, Direct: 0.2 mg/dL (ref 0.0–0.3)
CO2: 27 meq/L (ref 19–32)
Calcium: 8.6 mg/dL (ref 8.4–10.5)
Chloride: 106 meq/L (ref 96–112)
Cholesterol: 142 mg/dL (ref 0–200)
Creatinine, Ser: 0.7 mg/dL (ref 0.4–1.5)
Eosinophils Absolute: 0.1 10*3/uL (ref 0.0–0.7)
Eosinophils Relative: 1.9 % (ref 0.0–5.0)
GFR calc non Af Amer: 110.7 mL/min (ref >60)
Glucose, Bld: 91 mg/dL (ref 70–99)
HCT: 38.8 % — ABNORMAL LOW (ref 39.0–52.0)
HDL: 48.4 mg/dL (ref >39.00)
Hemoglobin: 13.1 g/dL (ref 13.0–17.0)
LDL Cholesterol: 82 mg/dL (ref 0–99)
Lymphocytes Relative: 19.6 % (ref 12.0–46.0)
Lymphs Abs: 1.2 10*3/uL (ref 0.7–4.0)
MCHC: 33.6 g/dL (ref 30.0–36.0)
MCV: 100.2 fL — ABNORMAL HIGH (ref 78.0–100.0)
Monocytes Absolute: 0.7 10*3/uL (ref 0.1–1.0)
Monocytes Relative: 11.4 % (ref 3.0–12.0)
Neutro Abs: 4 10*3/uL (ref 1.4–7.7)
Neutrophils Relative %: 66.6 % (ref 43.0–77.0)
POC INR: 2
Platelets: 104 10*3/uL — ABNORMAL LOW (ref 150.0–400.0)
Potassium: 3.9 meq/L (ref 3.5–5.1)
RBC: 3.88 M/uL — ABNORMAL LOW (ref 4.22–5.81)
RDW: 14 % (ref 11.5–14.6)
Sodium: 137 meq/L (ref 135–145)
TSH: 2.55 microintl units/mL (ref 0.35–5.50)
Total Bilirubin: 1.1 mg/dL (ref 0.3–1.2)
Total CHOL/HDL Ratio: 3
Total Protein: 6.6 g/dL (ref 6.0–8.3)
Triglycerides: 60 mg/dL (ref 0.0–149.0)
VLDL: 12 mg/dL (ref 0.0–40.0)
WBC: 6.1 10*3/uL (ref 4.5–10.5)

## 2010-07-16 ENCOUNTER — Ambulatory Visit: Payer: Self-pay | Admitting: Internal Medicine

## 2010-07-16 LAB — CONVERTED CEMR LAB: POC INR: 1.6

## 2010-07-21 ENCOUNTER — Telehealth: Payer: Self-pay | Admitting: Internal Medicine

## 2010-07-27 ENCOUNTER — Ambulatory Visit: Payer: Self-pay | Admitting: Cardiovascular Disease

## 2010-07-27 LAB — CONVERTED CEMR LAB: POC INR: 2.1

## 2010-08-02 ENCOUNTER — Telehealth: Payer: Self-pay | Admitting: Internal Medicine

## 2010-08-02 DIAGNOSIS — N401 Enlarged prostate with lower urinary tract symptoms: Secondary | ICD-10-CM

## 2010-08-10 ENCOUNTER — Telehealth: Payer: Self-pay | Admitting: Internal Medicine

## 2010-08-11 ENCOUNTER — Encounter (INDEPENDENT_AMBULATORY_CARE_PROVIDER_SITE_OTHER): Payer: Self-pay | Admitting: *Deleted

## 2010-08-16 ENCOUNTER — Encounter (INDEPENDENT_AMBULATORY_CARE_PROVIDER_SITE_OTHER): Payer: Self-pay | Admitting: *Deleted

## 2010-08-17 ENCOUNTER — Ambulatory Visit: Payer: Self-pay | Admitting: Cardiovascular Disease

## 2010-08-17 ENCOUNTER — Telehealth: Payer: Self-pay | Admitting: Cardiology

## 2010-08-17 LAB — CONVERTED CEMR LAB: POC INR: 2.5

## 2010-08-31 ENCOUNTER — Encounter: Payer: Self-pay | Admitting: Internal Medicine

## 2010-09-13 ENCOUNTER — Telehealth: Payer: Self-pay | Admitting: Internal Medicine

## 2010-09-14 ENCOUNTER — Ambulatory Visit: Payer: Self-pay | Admitting: Internal Medicine

## 2010-09-14 ENCOUNTER — Ambulatory Visit: Payer: Self-pay | Admitting: Cardiology

## 2010-09-14 LAB — CONVERTED CEMR LAB: POC INR: 2.1

## 2010-09-15 ENCOUNTER — Encounter: Payer: Self-pay | Admitting: Internal Medicine

## 2010-09-16 ENCOUNTER — Ambulatory Visit: Payer: Self-pay | Admitting: Internal Medicine

## 2010-09-21 ENCOUNTER — Ambulatory Visit: Payer: Self-pay | Admitting: Internal Medicine

## 2010-09-21 DIAGNOSIS — T6391XA Toxic effect of contact with unspecified venomous animal, accidental (unintentional), initial encounter: Secondary | ICD-10-CM | POA: Insufficient documentation

## 2010-10-12 ENCOUNTER — Ambulatory Visit: Payer: Self-pay | Admitting: Internal Medicine

## 2010-10-12 LAB — CONVERTED CEMR LAB: POC INR: 1.8

## 2010-10-25 ENCOUNTER — Ambulatory Visit: Payer: Self-pay | Admitting: Internal Medicine

## 2010-11-01 ENCOUNTER — Telehealth: Payer: Self-pay | Admitting: Internal Medicine

## 2010-11-03 ENCOUNTER — Ambulatory Visit: Payer: Self-pay | Admitting: Internal Medicine

## 2010-11-03 ENCOUNTER — Telehealth (INDEPENDENT_AMBULATORY_CARE_PROVIDER_SITE_OTHER): Payer: Self-pay | Admitting: *Deleted

## 2010-11-03 LAB — HM COLONOSCOPY

## 2010-11-04 ENCOUNTER — Encounter: Payer: Self-pay | Admitting: Internal Medicine

## 2010-11-15 ENCOUNTER — Telehealth: Payer: Self-pay | Admitting: Internal Medicine

## 2010-11-16 ENCOUNTER — Ambulatory Visit: Payer: Self-pay | Admitting: Internal Medicine

## 2010-11-16 ENCOUNTER — Encounter: Payer: Self-pay | Admitting: Internal Medicine

## 2010-11-16 LAB — CONVERTED CEMR LAB: INR: 1.5

## 2010-12-07 ENCOUNTER — Ambulatory Visit: Admission: RE | Admit: 2010-12-07 | Discharge: 2010-12-07 | Payer: Self-pay | Source: Home / Self Care

## 2010-12-28 ENCOUNTER — Telehealth: Payer: Self-pay | Admitting: Internal Medicine

## 2010-12-28 ENCOUNTER — Ambulatory Visit: Admission: RE | Admit: 2010-12-28 | Discharge: 2010-12-28 | Payer: Self-pay | Source: Home / Self Care

## 2011-01-04 NOTE — Medication Information (Signed)
Summary: Jesus [name]  Anticoagulant Therapy  Managed by: Cloyde Reams, RN, BSN Referring MD: Charlies Constable MD PCP: Link Snuffer MD: Jens Som MD, Arlys John Indication 1: Atrial Fibrillation (ICD-427.31) Lab Used: [facility] Site: [facility] INR POC 1.9 INR RANGE 2 - 3  Dietary changes: no    Health status changes: no    Bleeding/hemorrhagic complications: no    Recent/future hospitalizations: no    Any changes in medication regimen? yes       Details: Took abx x 5 days, completed abx Monday.    Recent/future dental: no  Any missed doses?: no       Is patient compliant with meds? yes       Allergies (verified): 1)  ! Iodine 2)  ! Codeine  Anticoagulation Management History:      The patient is taking warfarin and comes in today for a routine follow up visit.  Positive risk factors for bleeding include an age of 72 years or older.  Negative risk factors for bleeding include no history of CVA/TIA.  The bleeding index is 'intermediate risk'.  Positive CHADS2 values include History of HTN.  Negative CHADS2 values include Age > 72 years old and Prior Stroke/CVA/TIA.  The start date was 10/14/2005.  Anticoagulation responsible provider: Jens Som MD, Arlys John.  INR POC: 1.9.  Cuvette Lot#: [id number].  Exp: 03/2011.    Anticoagulation Management Assessment/Plan:      The patient's current anticoagulation dose is Coumadin 5 mg tabs: Take as directed by coumadin clinic..  The target INR is 2 - 3.  The next INR is due 02/10/2010.  Anticoagulation instructions were given to patient.  Results were reviewed/authorized by Cloyde Reams, RN, BSN.  He was notified by Cloyde Reams RN.         Prior Anticoagulation Instructions: INR 2.2  Continue on same dosage 1/2 tablet daily except 1 tablet on Mondays.   Recheck in 4 weeks.    Current Anticoagulation Instructions: INR 1.9  Take 1 tablet today then resume same dosage 1/2 tablet daily except 1 tablet on Mondays.  Recheck in 4 weeks.

## 2011-01-04 NOTE — Medication Information (Signed)
Summary: rov/sl  Anticoagulant Therapy  Managed by: Cloyde Reams, RN, BSN Referring MD: Charlies Constable MD PCP: Jacques Navy MD Supervising MD: Clifton James MD, Cristal Deer Indication 1: Atrial Fibrillation (ICD-427.31) Lab Used: [facility]  Site: [facility] INR POC 2.5 INR RANGE 2 - 3  Dietary changes: no    Health status changes: no    Bleeding/hemorrhagic complications: yes       Details: Pt reports sl amt blood in stool from straining/constipation.  Now taking prn stool softner.   Recent/future hospitalizations: no    Any changes in medication regimen? no    Recent/future dental: no  Any missed doses?: no       Is patient compliant with meds? yes       Allergies: 1)  ! Iodine 2)  ! Codeine  Anticoagulation Management History:      The patient is taking warfarin and comes in today for a routine follow up visit.  Positive risk factors for bleeding include an age of 72 years or older.  Negative risk factors for bleeding include no history of CVA/TIA.  The bleeding index is 'intermediate risk'.  Positive CHADS2 values include History of HTN.  Negative CHADS2 values include Age > 107 years old and Prior Stroke/CVA/TIA.  The start date was 10/14/2005.  His last INR was 1.4 ratio.  Anticoagulation responsible provider: Clifton James MD, Cristal Deer.  INR POC: 2.5.  Cuvette Lot#: [id number].  Exp: 10/2010.    Anticoagulation Management Assessment/Plan:      The patient's current anticoagulation dose is Coumadin 5 mg tabs: Take as directed by coumadin clinic..  The target INR is 2 - 3.  The next INR is due 09/14/2010.  Anticoagulation instructions were given to patient.  Results were reviewed/authorized by Cloyde Reams, RN, BSN.  He was notified by Cloyde Reams RN.         Prior Anticoagulation Instructions: INR 2.1  Continue taking Coumadin 0.5 tab (2.5 mg) on all days except Coumadin 1 tab (5 mg) on Mondays and Fridays. Return to clinic in 3 weeks.   Current Anticoagulation  Instructions: INR 2.5  Continue on same dosage 1/2 tablet daily except 1 tablet on Mondays and Fridays.  Recheck in 4 weeks.

## 2011-01-04 NOTE — Medication Information (Signed)
Summary: rov/ewj  Anticoagulant Therapy  Managed by: Shelby Dubin, PharmD, BCPS, CPP Referring MD: Charlies Constable MD PCP: Jacques Navy MD Supervising MD: Jens Som MD, Arlys John Indication 1: Atrial Fibrillation (ICD-427.31) Lab Used: [facility] Site: [facility] INR POC 2.2 INR RANGE 2 - 3  Dietary changes: no    Health status changes: no    Bleeding/hemorrhagic complications: no    Recent/future hospitalizations: yes       Details: pending gall bladder surgery on 3/21...  Any changes in medication regimen? no    Recent/future dental: no  Any missed doses?: yes     Details: missed 1 day 13 days ago  Is patient compliant with meds? yes       Current Medications (verified): 1)  Amiodarone Hcl 200 Mg  Tabs (Amiodarone Hcl) .[name].. 1 By Mouth Once Daily 2)  Multivitamins   Tabs (Multiple Vitamin) .[name].. 1 By Mouth Once Daily 3)  Vitamin C 1000 Mg  Tabs (Ascorbic Acid) .[name].. 1 By Mouth Once Daily 4)  Lipitor 80 Mg Tabs (Atorvastatin Calcium) .... 1/2 Tab Once Daily 5)  Adult Aspirin Low Strength 81 Mg  Tbdp (Aspirin) .[name].. 1 By Mouth Once Daily 6)  Benicar 40 Mg  Tabs (Olmesartan Medoxomil) .[name].. 1 By Mouth Once Daily 7)  Protonix 40 Mg  Tbec (Pantoprazole Sodium) .[name].. 1  By Mouth As Needed 8)  Claritin 10 Mg  Tabs (Loratadine) .[name].. 1 By Mouth As Needed 9)  Nasacort Aq 55 Mcg/act  Aers (Triamcinolone Acetonide(Nasal)) .... As Needed 10)  Coumadin 5 Mg Tabs (Warfarin Sodium) .... Take As Directed By Coumadin Clinic. 11)  Amlodipine Besylate 5 Mg Tabs (Amlodipine Besylate) .... Take One Tablet By Mouth Daily 12)  Oxybutynin Chloride 5 Mg Tabs (Oxybutynin Chloride) .[name].. 1 By Mouth Bid  Allergies (verified): 1)  ! Iodine 2)  ! Codeine  Anticoagulation Management History:      The patient is taking warfarin and comes in today for a routine follow up visit.  Positive risk factors for bleeding include an age of 72 years or older.  Negative risk factors for bleeding include no history of  CVA/TIA.  The bleeding index is 'intermediate risk'.  Positive CHADS2 values include History of HTN.  Negative CHADS2 values include Age > 66 years old and Prior Stroke/CVA/TIA.  The start date was 10/14/2005.  His last INR was 1.4 ratio.  Anticoagulation responsible provider: Jens Som MD, Arlys John.  INR POC: 2.2.  Cuvette Lot#: [id number].  Exp: 04/2011.    Anticoagulation Management Assessment/Plan:      The patient's current anticoagulation dose is Coumadin 5 mg tabs: Take as directed by coumadin clinic..  The target INR is 2 - 3.  The next INR is due 03/02/2010.  Anticoagulation instructions were given to patient.  Results were reviewed/authorized by Shelby Dubin, PharmD, BCPS, CPP.  He was notified by Shelby Dubin PharmD, BCPS, CPP.         Prior Anticoagulation Instructions: INR 1.9  Take 1 tablet today then resume same dosage 1/2 tablet daily except 1 tablet on Mondays.  Recheck in 4 weeks.    Current Anticoagulation Instructions: INR 2.2  Continue 0.5 tab daily except 1 tab each Monday.  Take last coumadin (warfarin) dose on 3/15 to prepare for 3/21 surgery.  Resume warfarin asap after surgery.  Recheck INR 4 - 5 days after surgery.  Please call at [phone] to schedule this appointment.

## 2011-01-04 NOTE — Medication Information (Signed)
Summary: rov/tm  Anticoagulant Therapy  Managed by: Bethena Midget, RN, BSN Referring MD: Charlies Constable MD PCP: Jacques Navy MD Supervising MD: Gala Romney MD, Reuel Boom Indication 1: Atrial Fibrillation (ICD-427.31) Lab Used: [facility] Site: [facility] INR POC 2.5 INR RANGE 2 - 3  Dietary changes: no    Health status changes: no    Bleeding/hemorrhagic complications: yes       Details: Scant amt when blows nose.   Recent/future hospitalizations: no    Any changes in medication regimen? no    Recent/future dental: no  Any missed doses?: no       Is patient compliant with meds? yes       Allergies: 1)  ! Iodine 2)  ! Codeine  Anticoagulation Management History:      The patient is taking warfarin and comes in today for a routine follow up visit.  Positive risk factors for bleeding include an age of 20 years or older.  Negative risk factors for bleeding include no history of CVA/TIA.  The bleeding index is 'intermediate risk'.  Positive CHADS2 values include History of HTN.  Negative CHADS2 values include Age > 79 years old and Prior Stroke/CVA/TIA.  The start date was 10/14/2005.  His last INR was 1.4 ratio.  Anticoagulation responsible provider: Hoyte Ziebell MD, Reuel Boom.  INR POC: 2.5.  Cuvette Lot#: [id number].  Exp: 08/2011.    Anticoagulation Management Assessment/Plan:      The patient's current anticoagulation dose is Coumadin 5 mg tabs: Take as directed by coumadin clinic..  The target INR is 2 - 3.  The next INR is due 06/29/2010.  Anticoagulation instructions were given to patient.  Results were reviewed/authorized by Bethena Midget, RN, BSN.  He was notified by Bethena Midget, RN, BSN.         Prior Anticoagulation Instructions: INR 1.6 Today take extra 1/2 pill then resume 1/2 pill everyday except 1 pill on Monday. Recheck in 3 weeks.   Current Anticoagulation Instructions: INR 2.5 Continue 1/2 pill everyday except 1 pill on Mondays. Recheck in 4 weeks

## 2011-01-04 NOTE — Progress Notes (Signed)
  Phone Note Outgoing Call   Summary of Call: KB: heads up, his labs returned with abnormal LFT's so I am concerned about his gallbladder, I left him a message to return for an admission. Please fax over his note and labs from today so the ER will have it when he arrives. Initial call taken by: Etta Grandchild MD,  January 29, 2010 3:27 PM     Appended Document:  Faxed notes to the ER.

## 2011-01-04 NOTE — Assessment & Plan Note (Signed)
Summary: nocturia/SD   Vital Signs:  Patient profile:   70 year old male Height:      62 inches Weight:      138 pounds BMI:     25.33 O2 Sat:      98 % on Room air Temp:     97.8 degrees F oral Pulse rate:   55 / minute BP sitting:   120 / 72  (left arm) Cuff size:   regular  Vitals Entered By: Bill Salinas CMA (June 21, 2010 1:42 PM)  O2 Flow:  Room air CC: follow-up visit/ ab   Primary Care Provider:  Jacques Navy MD  CC:  follow-up visit/ ab.  History of Present Illness: patient had scheduled appt due to noctria. since scheduling the appointment he has given up caffeine which fixed the problem.  Current Medications (verified): 1)  Amiodarone Hcl 200 Mg  Tabs (Amiodarone Hcl) .[name].. 1 By Mouth Once Daily 2)  Multivitamins   Tabs (Multiple Vitamin) .[name].. 1 By Mouth Once Daily 3)  Vitamin C 1000 Mg  Tabs (Ascorbic Acid) .[name].. 1 By Mouth Once Daily 4)  Lipitor 80 Mg Tabs (Atorvastatin Calcium) .... 1/2 Tab Once Daily 5)  Adult Aspirin Low Strength 81 Mg  Tbdp (Aspirin) .[name].. 1 By Mouth Once Daily 6)  Benicar 40 Mg  Tabs (Olmesartan Medoxomil) .[name].. 1 By Mouth Once Daily 7)  Protonix 40 Mg  Tbec (Pantoprazole Sodium) .[name].. 1  By Mouth As Needed 8)  Claritin 10 Mg  Tabs (Loratadine) .[name].. 1 By Mouth As Needed 9)  Nasacort Aq 55 Mcg/act  Aers (Triamcinolone Acetonide(Nasal)) .... As Needed 10)  Coumadin 5 Mg Tabs (Warfarin Sodium) .... Take As Directed By Coumadin Clinic. 11)  Amlodipine Besylate 5 Mg Tabs (Amlodipine Besylate) .... Take One Tablet By Mouth Daily  Allergies (verified): 1)  ! Iodine 2)  ! Codeine   Complete Medication List: 1)  Amiodarone Hcl 200 Mg Tabs (Amiodarone hcl) .[name].. 1 by mouth once daily 2)  Multivitamins Tabs (Multiple vitamin) .[name].. 1 by mouth once daily 3)  Vitamin C 1000 Mg Tabs (Ascorbic acid) .[name].. 1 by mouth once daily 4)  Lipitor 80 Mg Tabs (Atorvastatin calcium) .... 1/2 tab once daily 5)  Adult Aspirin Low Strength 81 Mg Tbdp (Aspirin) .[name]..  1 by mouth once daily 6)  Benicar 40 Mg Tabs (Olmesartan medoxomil) .[name].. 1 by mouth once daily 7)  Protonix 40 Mg Tbec (Pantoprazole sodium) .[name].. 1  by mouth as needed 8)  Claritin 10 Mg Tabs (Loratadine) .[name].. 1 by mouth as needed 9)  Nasacort Aq 55 Mcg/act Aers (Triamcinolone acetonide(nasal)) .... As needed 10)  Coumadin 5 Mg Tabs (Warfarin sodium) .... Take as directed by coumadin clinic. 11)  Amlodipine Besylate 5 Mg Tabs (Amlodipine besylate) .... Take one tablet by mouth daily  Other Orders: No Charge Patient Arrived (NCPA0) (NCPA0)

## 2011-01-04 NOTE — Medication Information (Signed)
Summary: rov/sl  Anticoagulant Therapy  Managed by: Bethena Midget, RN, BSN Referring MD: Charlies Constable MD PCP: Jacques Navy MD Supervising MD: Graciela Husbands MD, Viviann Spare Indication 1: Atrial Fibrillation (ICD-427.31) Lab Used: [facility] Site: [facility] INR POC 1.8 INR RANGE 2 - 3  Dietary changes: no    Health status changes: no    Bleeding/hemorrhagic complications: no    Recent/future hospitalizations: no    Any changes in medication regimen? yes       Details: Vesicare started 2 weeks ago  Recent/future dental: no  Any missed doses?: no       Is patient compliant with meds? yes       Allergies: 1)  ! Iodine 2)  ! Codeine  Anticoagulation Management History:      The patient is taking warfarin and comes in today for a routine follow up visit.  Positive risk factors for bleeding include an age of 72 years or older.  Negative risk factors for bleeding include no history of CVA/TIA.  The bleeding index is 'intermediate risk'.  Positive CHADS2 values include History of HTN.  Negative CHADS2 values include Age > 43 years old and Prior Stroke/CVA/TIA.  The start date was 10/14/2005.  His last INR was 1.4 ratio.  Anticoagulation responsible provider: Graciela Husbands MD, Viviann Spare.  INR POC: 1.8.  Cuvette Lot#: [id number].  Exp: 10/2010.    Anticoagulation Management Assessment/Plan:      The patient's current anticoagulation dose is Coumadin 5 mg tabs: Take as directed by coumadin clinic..  The target INR is 2 - 3.  The next INR is due 11/09/2010.  Anticoagulation instructions were given to patient.  Results were reviewed/authorized by Bethena Midget, RN, BSN.  He was notified by Bethena Midget, RN, BSN.         Prior Anticoagulation Instructions: INR 2.1  Continue taking Coumadin 2.5 mg (0.5 tab) on all days except Coumadin 5 mg (1 tab) on Mondays and Fridays. Return to clinic in 4 weeks.   Current Anticoagulation Instructions: INR 1.8 Today take extra 1/2 pill then resume 1/2 pill everyday  except 1 pill on Mondays and Fridays. Recheck in 4 weeks.

## 2011-01-04 NOTE — Progress Notes (Signed)
Summary: gallbladder surgery   Phone Note Call from Patient Call back at Home Phone [phone]   Caller: Spouse Reason for Call: Talk to Nurse Summary of Call: pt is to have gallbladder surgery, needs to know when to stop coumadin prior to surgery Initial call taken by: Migdalia Dk,  February 02, 2010 9:28 AM  Follow-up for Phone Call        Discussed with Dr. Juanda Chance, the pt may hold coumadin 5 days prior to surgery. I called and spoke with the pt's wife, she verbalizes understanding of this.  Follow-up by: Sherri Rad, RN, BSN,  February 02, 2010 12:05 PM

## 2011-01-04 NOTE — Procedures (Signed)
Summary: COLON   Colonoscopy  Procedure date:  09/14/2000  Findings:      [facility]    Procedures Next Due Date:    Colonoscopy: 09/2010 Patient Name: Jesus [name], Jesus [name] MRN: [id number] Procedure Procedures: Colonoscopy CPT: 04540.  Personnel: Endoscopist: Dora L. Juanda Chance, MD.  Referred By: Rosalyn Gess Norins, MD.  Exam Location: Exam performed in [facility]. Outpatient  Patient Consent: Procedure, Alternatives, Risks and Benefits discussed, consent obtained, from patient.  Indications  Surveillance of: screening colonoscopy.  History  Pre-Exam Physical: Performed Sep 14, 2000. Cardio-pulmonary exam, Rectal exam, HEENT exam , Abdominal exam, Neurological exam WNL.  Exam Exam: Extent of exam reached: Cecum, extent intended: Cecum.  Patient position: on left side. ASA Classification: II.  Monitoring: Pulse and BP monitoring, Oximetry used. Supplemental O2 given.  Colon Prep Used Mag Citrate for colon prep. Prep results: fair.  Fluoroscopy: Fluoroscopy was not used.  Sedation Meds: Fentanyl 100 mcg. Versed 7 mg.  Findings - NORMAL EXAM: Cecum to Rectum.   Assessment Normal examination.  Events  Unplanned Interventions: No intervention was required.  Unplanned Events: There were no complications. Plans Medication Plan: Continue current medications.  Patient Education: Patient given standard instructions for: a normal exam. Patient instructed to get routine colonoscopy every 10 years.  Disposition: After procedure patient sent to recovery. After recovery patient sent home.  Scheduling/Referral: Follow-Up prn.   This report was created from the original endoscopy report, which was reviewed and signed by the above listed endoscopist.

## 2011-01-04 NOTE — Assessment & Plan Note (Signed)
Summary: STOMACH PAIN/ NO NAUSEA/ NO FEVER/ NWS   Vital Signs:  Patient profile:   70 year old male Height:      62 inches Weight:      133.75 pounds BMI:     24.55 O2 Sat:      96 % on Room air Temp:     98.3 degrees F oral Pulse rate:   76 / minute Pulse rhythm:   regular Resp:     16 per minute BP sitting:   132 / 64  (left arm)  Vitals Entered By: Lucious Groves (January 29, 2010 1:39 PM)  O2 Flow:  Room air CC: C/O abd pain all over, somewhat better now./kb Is Patient Diabetic? No Pain Assessment Patient in pain? yes     Location: abdomen Intensity: 4   Primary Care Provider:  Norins  CC:  C/O abd pain all over and somewhat better now./kb.  History of Present Illness: New to me he reports the acute onset of sharp, diffuse abd. pain for 12 hours that has slowly dissipated. He had a normal BM a few hours ago.  Preventive Screening-Counseling & Management  Alcohol-Tobacco     Alcohol drinks/day: 0     Smoking Status: never  Hep-HIV-STD-Contraception     Hepatitis Risk: no risk noted     HIV Risk: no risk noted     STD Risk: no risk noted  Medications Prior to Update: 1)  Amiodarone Hcl 200 Mg  Tabs (Amiodarone Hcl) .[name].. 1 By Mouth Once Daily 2)  Multivitamins   Tabs (Multiple Vitamin) .[name].. 1 By Mouth Once Daily 3)  Vitamin C 1000 Mg  Tabs (Ascorbic Acid) .[name].. 1 By Mouth Once Daily 4)  Lipitor 80 Mg Tabs (Atorvastatin Calcium) .... 1/2 Tab Once Daily 5)  Adult Aspirin Low Strength 81 Mg  Tbdp (Aspirin) .[name].. 1 By Mouth Once Daily 6)  Benicar 40 Mg  Tabs (Olmesartan Medoxomil) .[name].. 1 By Mouth Once Daily 7)  Protonix 40 Mg  Tbec (Pantoprazole Sodium) .[name].. 1  By Mouth As Needed 8)  Claritin 10 Mg  Tabs (Loratadine) .[name].. 1 By Mouth As Needed 9)  Nasacort Aq 55 Mcg/act  Aers (Triamcinolone Acetonide(Nasal)) .... As Needed 10)  Coumadin 5 Mg Tabs (Warfarin Sodium) .... Take As Directed By Coumadin Clinic. 11)  Amlodipine Besylate 5 Mg Tabs (Amlodipine Besylate) ....  Take One Tablet By Mouth Daily 12)  Doxycycline Hyclate 100 Mg Caps (Doxycycline Hyclate) .[name].. 1 By Mouth Two Times A Day X 7 For Bronchitis  Current Medications (verified): 1)  Amiodarone Hcl 200 Mg  Tabs (Amiodarone Hcl) .[name].. 1 By Mouth Once Daily 2)  Multivitamins   Tabs (Multiple Vitamin) .[name].. 1 By Mouth Once Daily 3)  Vitamin C 1000 Mg  Tabs (Ascorbic Acid) .[name].. 1 By Mouth Once Daily 4)  Lipitor 80 Mg Tabs (Atorvastatin Calcium) .... 1/2 Tab Once Daily 5)  Adult Aspirin Low Strength 81 Mg  Tbdp (Aspirin) .[name].. 1 By Mouth Once Daily 6)  Benicar 40 Mg  Tabs (Olmesartan Medoxomil) .[name].. 1 By Mouth Once Daily 7)  Protonix 40 Mg  Tbec (Pantoprazole Sodium) .[name].. 1  By Mouth As Needed 8)  Claritin 10 Mg  Tabs (Loratadine) .[name].. 1 By Mouth As Needed 9)  Nasacort Aq 55 Mcg/act  Aers (Triamcinolone Acetonide(Nasal)) .... As Needed 10)  Coumadin 5 Mg Tabs (Warfarin Sodium) .... Take As Directed By Coumadin Clinic. 11)  Amlodipine Besylate 5 Mg Tabs (Amlodipine Besylate) .... Take One Tablet By  Mouth Daily 12)  Oxybutynin Chloride 5 Mg Tabs (Oxybutynin Chloride) .[name].. 1 By Mouth Bid  Allergies (verified): 1)  ! Iodine 2)  ! Codeine  Past History:  Past Medical History: Reviewed history from 10/16/2008 and no changes required.  GASTROESOPHAGEAL REFLUX DISEASE (ICD-530.81) FIBRILLATION, ATRIAL (ICD-427.31) AORTIC STENOSIS, MILD (ICD-424.1) CAD (ICD-414.00) HYPERTENSION (ICD-401.9 1. Coronary artery disease, status post coronary bypass graft surgery,     1997. 2. Atrial fibrillation, now holding sinus rhythm on Amiodarone. 3. 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 4. Mild aortic stenosis with a mean aortic valve gradient of 12 mmHg. 5. Hypertension. 6. Hyperlipidemia. 7. Facial lesions which may represent actinic keratoses and possible     photosensitivity from Amiodarone.   Past Surgical History: Reviewed history from  04/20/2009 and no changes required. Tonsillectomy Coronary artery bypass graft Nov 18 2006 PROCEDURE:  DC cardioversion.   The patient has been anticoagulated with therapeutic INRs on Coumadin for  greater than 3 weeks.  He has been on rate control medication.  He is  brought in today for cardioversion.   Procedure was performed with AP paddles with 120 watt seconds.  He was given  250 mg of pentothal by Dr. Jacklynn Bue.  He converted on the first shock to  sinus rhythm.  There were no immediate complications.  Family History: Reviewed history from 06/17/2009 and no changes required. Mother died of CVA at age 59. Father died of MI at age 80. He has one brother who has a history of atrial fibrillation.  Social History: Reviewed history from 05/22/2009 and no changes required. Married Does not smoke Alcohol use-no Drug use-no Regular exercise-yes Hepatitis Risk:  no risk noted HIV Risk:  no risk noted STD Risk:  no risk noted  Review of Systems       The patient complains of abdominal pain.  The patient denies anorexia, fever, weight loss, chest pain, syncope, prolonged cough, headaches, hemoptysis, melena, hematochezia, severe indigestion/heartburn, hematuria, incontinence, and angioedema.   General:  Complains of chills and loss of appetite; denies fatigue, fever, sweats, and weakness. GI:  Complains of abdominal pain and loss of appetite; denies bloody stools, change in bowel habits, constipation, dark tarry stools, diarrhea, gas, hemorrhoids, indigestion, nausea, vomiting, vomiting blood, and yellowish skin color.  Physical Exam  General:  alert, well-developed, well-nourished, well-hydrated, appropriate dress, normal appearance, healthy-appearing, cooperative to examination, and good hygiene.   Head:  normocephalic, atraumatic, no abnormalities observed, and no abnormalities palpated.   Eyes:  pink  moist mm., no icterus or injection. Ears:  R ear normal and L ear normal.     Mouth:  good dentition, no gingival abnormalities, no dental plaque, pharynx pink and moist, no erythema, no exudates, no posterior lymphoid hypertrophy, and no postnasal drip.   Neck:  supple, full ROM, no masses, no thyromegaly, normal carotid upstroke, no carotid bruits, no cervical lymphadenopathy, and no neck tenderness.   Chest Wall:  no deformities, no tenderness, no masses, no gynecomastia, surgical scar, and sternotomy scar.   Lungs:  normal respiratory effort, no intercostal retractions, no accessory muscle use, normal breath sounds, no dullness, no fremitus, no crackles, and no wheezes.   Heart:  normal rate and regular rhythm.  grade 2 /6 systolic murmur heard everywhere. Abdomen:  soft, non-tender, normal bowel sounds, no distention, no masses, no guarding, no rigidity, no rebound tenderness, no abdominal hernia, no inguinal hernia, no hepatomegaly, and no splenomegaly.  Rectal:  heme negative stool. normal sphincter tone, no masses, no tenderness, no fissures, no fistulae, no perianal rash, and external hemorrhoid(s).   Genitalia:  uncircumcised, no hydrocele, no varicocele, no scrotal masses, no cutaneous lesions, no urethral discharge, R testes atrophic, and L testes atrophic.   Prostate:  no nodules, no asymmetry, no induration, and 2+ enlarged.   Msk:  no joint tenderness and no joint swelling.   Pulses:  R and L carotid,radial,femoral,dorsalis pedis and posterior tibial pulses are full and equal bilaterally Extremities:  No clubbing, cyanosis, edema, or deformity noted with normal full range of motion of all joints.   Neurologic:  No cranial nerve deficits noted. Station and gait are normal. Plantar reflexes are down-going bilaterally. DTRs are symmetrical throughout. Sensory, motor and coordinative functions appear intact. Skin:  turgor normal, color normal, no rashes, no suspicious lesions, no ulcerations, no edema, and ecchymosis and senile purpura on forearms. Cervical Nodes:   no anterior cervical adenopathy and no posterior cervical adenopathy.   Axillary Nodes:  no R axillary adenopathy and no L axillary adenopathy.   Inguinal Nodes:  no R inguinal adenopathy and no L inguinal adenopathy.   Psych:  Oriented X3, memory intact for recent and remote, and good eye contact.     Impression & Recommendations:  Problem # 1:  ABDOMINAL PAIN, GENERALIZED (ICD-789.07) Assessment New CBC shows high neutrophils and slightly elevated WBC so I believe this is infectious, most likely diverticulitis. I offerred admission for IV antibiotics vs outpt. oral antibiotics and he chose the latter so will start Cipro/Flagyl empirically and get a CT done as soon as possible. He will call/return for any inability to take the antibiotics or new/worsening symptoms. UPDATE: pt just left and his other labs returned showing elevated LFT's so I am now worried about cholelithiasis/cholecystitis and have left a message on pt's voicemail to return to this office or the ER for further evaluation. I think he will need a more urgent evaluation. Orders: Venipuncture ([address]) Radiology Referral (Radiology) T-Abdomen 2-view (74020TC) TLB-BMP (Basic Metabolic Panel-BMET) (80048-METABOL) TLB-Hepatic/Liver Function Pnl (80076-HEPATIC) TLB-CBC Platelet - w/Differential (85025-CBCD) TLB-Amylase (82150-AMYL) TLB-Lipase (83690-LIPASE) TLB-Udip w/ Micro (81001-URINE) Hemoccult Guaiac-1 spec.(in office) (82270)  Complete Medication List: 1)  Amiodarone Hcl 200 Mg Tabs (Amiodarone hcl) .[name].. 1 by mouth once daily 2)  Multivitamins Tabs (Multiple vitamin) .[name].. 1 by mouth once daily 3)  Vitamin C 1000 Mg Tabs (Ascorbic acid) .[name].. 1 by mouth once daily 4)  Lipitor 80 Mg Tabs (Atorvastatin calcium) .... 1/2 tab once daily 5)  Adult Aspirin Low Strength 81 Mg Tbdp (Aspirin) .[name].. 1 by mouth once daily 6)  Benicar 40 Mg Tabs (Olmesartan medoxomil) .[name].. 1 by mouth once daily 7)  Protonix 40 Mg Tbec (Pantoprazole  sodium) .[name].. 1  by mouth as needed 8)  Claritin 10 Mg Tabs (Loratadine) .[name].. 1 by mouth as needed 9)  Nasacort Aq 55 Mcg/act Aers (Triamcinolone acetonide(nasal)) .... As needed 10)  Coumadin 5 Mg Tabs (Warfarin sodium) .... Take as directed by coumadin clinic. 11)  Amlodipine Besylate 5 Mg Tabs (Amlodipine besylate) .... Take one tablet by mouth daily 12)  Oxybutynin Chloride 5 Mg Tabs (Oxybutynin chloride) .[name].. 1 by mouth bid 13)  Cipro 500 Mg Tab (Ciprofloxacin hcl) .... Take 1 tablet by mouth morning and night x 10 days 14)  Metronidazole 250 Mg Tabs (Metronidazole) .... One by mouth qid for 10 days  Patient Instructions: 1)  Please go the the ER if you can't swallow  or keep down the antibiotics. Call for any new or worsening symptoms. Please come back for a recheck in 3-5 days. 2)  Take your antibiotic as prescribed until ALL of it is gone, but stop if you develop a rash or swelling and contact our office as soon as possible. Prescriptions: METRONIDAZOLE 250 MG TABS (METRONIDAZOLE) One by mouth QID for 10 days  #40 x 1   Entered and Authorized by:   Etta Grandchild MD   Signed by:   Etta Grandchild MD on 01/29/2010   Method used:   Electronically to        [facility]* (retail)       [address]       [place], [place]  [address]       Ph: [phone]       Fax: [phone]   RxID:   [id number] CIPRO 500 MG TAB (CIPROFLOXACIN HCL) Take 1 tablet by mouth morning and night X 10 days  #20 x 1   Entered and Authorized by:   Etta Grandchild MD   Signed by:   Etta Grandchild MD on 01/29/2010   Method used:   Electronically to        [facility]* (retail)       [address]       [place], [place]  [address]       Ph: [phone]       Fax: [phone]   RxID:   [id number] METRONIDAZOLE 250 MG TABS (METRONIDAZOLE) One by mouth QID for 10 days  #40 x 1   Entered and Authorized by:   Etta Grandchild MD   Signed by:   Etta Grandchild MD on 01/29/2010   Method used:   Print then Give to Patient   RxID:   [id number] CIPRO 500 MG TAB (CIPROFLOXACIN HCL) Take 1 tablet by mouth morning and night X 10 days  #20 x 0   Entered and Authorized by:   Etta Grandchild MD   Signed by:   Etta Grandchild MD on 01/29/2010   Method used:   Print then Give to Patient   RxID:   [id number]

## 2011-01-04 NOTE — Progress Notes (Signed)
Summary: refill request   Phone Note Refill Request Message from:  Patient on August 17, 2010 9:54 AM  Refills Requested: Medication #1:  AMIODARONE HCL 200 MG  TABS take 1/2 tablet by mouth once daily [facility]   Method Requested: Telephone to Pharmacy Initial call taken by: Glynda Jaeger,  August 17, 2010 9:54 AM Caller: Patient [phone] Reason for Call: Talk to Nurse Summary of Call: pt   Follow-up for Phone Call       Follow-up by: Judithe Modest CMA,  August 17, 2010 1:04 PM    Prescriptions: AMIODARONE HCL 200 MG  TABS (AMIODARONE HCL) take 1/2 tablet by mouth once daily  #15 x 4   Entered by:   Judithe Modest CMA   Authorized by:   Lenoria Farrier, MD, [facility]   Signed by:   Judithe Modest CMA on 08/17/2010   Method used:   Electronically to        [facility]* (retail)       [address]       [place], [place]  [address]       Ph: [phone]       Fax: [phone]   RxID:   [id number]

## 2011-01-04 NOTE — Medication Information (Signed)
Summary: rov/ewj      Allergies Added:  Anticoagulant Therapy  Managed by: Reina Fuse, PharmD Referring MD: Charlies Constable MD PCP: Jacques Navy MD Supervising MD: Juanda Chance MD, Bruce Indication 1: Atrial Fibrillation (ICD-427.31) Lab Used: [facility] Site: [facility] INR POC 2.1 INR RANGE 2 - 3  Dietary changes: no    Health status changes: no    Bleeding/hemorrhagic complications: no    Recent/future hospitalizations: no    Any changes in medication regimen? no    Recent/future dental: no  Any missed doses?: no       Is patient compliant with meds? yes      Comments: Pt fell twice on Sunday due to hip giving out. Some bleeding on knee that stopped quickly. Recommended pt follow up with PCP regarding hip problems.   Current Medications (verified): 1)  Amiodarone Hcl 200 Mg  Tabs (Amiodarone Hcl) .... Take 1/2 Tablet By Mouth Once Daily 2)  Multivitamins   Tabs (Multiple Vitamin) .[name].. 1 By Mouth Once Daily 3)  Vitamin C 1000 Mg  Tabs (Ascorbic Acid) .[name].. 1 By Mouth Once Daily 4)  Lipitor 80 Mg Tabs (Atorvastatin Calcium) .... 1/2 Tab Once Daily 5)  Adult Aspirin Low Strength 81 Mg  Tbdp (Aspirin) .[name].. 1 By Mouth Once Daily 6)  Benicar 40 Mg  Tabs (Olmesartan Medoxomil) .[name].. 1 By Mouth Once Daily 7)  Protonix 40 Mg  Tbec (Pantoprazole Sodium) .[name].. 1  By Mouth As Needed 8)  Claritin 10 Mg  Tabs (Loratadine) .[name].. 1 By Mouth As Needed 9)  Nasacort Aq 55 Mcg/act  Aers (Triamcinolone Acetonide(Nasal)) .... As Needed 10)  Coumadin 5 Mg Tabs (Warfarin Sodium) .... Take As Directed By Coumadin Clinic. 11)  Amlodipine Besylate 5 Mg Tabs (Amlodipine Besylate) .... Take One Tablet By Mouth Daily 12)  Flomax 0.4 Mg Caps (Tamsulosin Hcl) .[name].. 1 At Bedtime  Allergies (verified): 1)  ! Iodine 2)  ! Codeine  Anticoagulation Management History:      The patient is taking warfarin and comes in today for a routine follow up visit.  Positive risk factors for bleeding include an age  of 72 years or older.  Negative risk factors for bleeding include no history of CVA/TIA.  The bleeding index is 'intermediate risk'.  Positive CHADS2 values include History of HTN.  Negative CHADS2 values include Age > 78 years old and Prior Stroke/CVA/TIA.  The start date was 10/14/2005.  His last INR was 1.4 ratio.  Anticoagulation responsible provider: Juanda Chance MD, Smitty Cords.  INR POC: 2.1.  Cuvette Lot#: [id number].  Exp: 10/2010.    Anticoagulation Management Assessment/Plan:      The patient's current anticoagulation dose is Coumadin 5 mg tabs: Take as directed by coumadin clinic..  The target INR is 2 - 3.  The next INR is due 10/12/2010.  Anticoagulation instructions were given to patient.  Results were reviewed/authorized by Reina Fuse, PharmD.  He was notified by Reina Fuse PharmD.         Prior Anticoagulation Instructions: INR 2.5  Continue on same dosage 1/2 tablet daily except 1 tablet on Mondays and Fridays.  Recheck in 4 weeks.    Current Anticoagulation Instructions: INR 2.1  Continue taking Coumadin 2.5 mg (0.5 tab) on all days except Coumadin 5 mg (1 tab) on Mondays and Fridays. Return to clinic in 4 weeks.

## 2011-01-04 NOTE — Progress Notes (Signed)
Summary: question about coumadin   Phone Note Call from Patient Call back at Home Phone [phone]   Caller: Patient Summary of Call: Pt have questions about coumadin Initial call taken by: Judie Grieve,  November 03, 2010 4:52 PM  Follow-up for Phone Call        Spoke with pt's wife.  Pt has colonoscopy yesterday and some polyps were removed.  Dr. Lina Sar asked pt to continue to hold Coumadin x 1 week.  Pt has appt scheduled with [facility] on 12/6.  Will reschedule for 1 week after pt restarts Coumadin.  Follow-up by: Weston Brass PharmD,  November 03, 2010 5:11 PM

## 2011-01-04 NOTE — Assessment & Plan Note (Signed)
Summary: discuss colon on BT--ch.    History of Present Illness Visit Type: Initial Visit Primary GI MD: Lina Sar MD Primary Provider: Jacques Navy MD Chief Complaint: Colon scrrening 1o years - on blood thinners History of Present Illness:   This is a 70 year old white male patient of Dr. Charlies Constable who is here to discuss having a recall colonoscopy. He had a screening colonoscopy in October 2001. It was a normal exam. He has recently noticed bright red blood per rectum in small amounts. His bowel habits are regular except when he takes medication for his bladder. 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 His ejection fraction initially dropped to 30% but has increased recently to 45%. He has moderate to severe aortic stenosis with a gradient of 22 mm. He has atrial fibrillation controlled on amiodarone. Patient underwent a laparoscopic cholecystectomy in March 2010. He stopped his Coumadin 5 days prior to surgery.   GI Review of Systems    Reports acid reflux.      Denies abdominal pain, belching, bloating, chest pain, dysphagia with liquids, dysphagia with solids, heartburn, loss of appetite, nausea, vomiting, vomiting blood, weight loss, and  weight gain.      Reports constipation.     Denies anal fissure, black tarry stools, change in bowel habit, diarrhea, diverticulosis, fecal incontinence, heme positive stool, hemorrhoids, irritable bowel syndrome, jaundice, light color stool, liver problems, rectal bleeding, and  rectal pain.    Current Medications (verified): 1)  Amiodarone Hcl 200 Mg  Tabs (Amiodarone Hcl) .... Take 1/2 Tablet By Mouth Once Daily 2)  Multivitamins   Tabs (Multiple Vitamin) .[name].. 1 By Mouth Once Daily 3)  Vitamin C 1000 Mg  Tabs (Ascorbic Acid) .[name].. 1 By Mouth Once Daily 4)  Lipitor 80 Mg Tabs (Atorvastatin Calcium) .... 1/2 Tab Once Daily 5)  Adult Aspirin Low Strength 81 Mg  Tbdp (Aspirin) .[name].. 1 By Mouth Once Daily 6)   Benicar 40 Mg  Tabs (Olmesartan Medoxomil) .[name].. 1 By Mouth Once Daily 7)  Protonix 40 Mg  Tbec (Pantoprazole Sodium) .[name].. 1  By Mouth As Needed 8)  Claritin 10 Mg  Tabs (Loratadine) .[name].. 1 By Mouth As Needed 9)  Nasacort Aq 55 Mcg/act  Aers (Triamcinolone Acetonide(Nasal)) .... As Needed 10)  Coumadin 5 Mg Tabs (Warfarin Sodium) .... Take As Directed By Coumadin Clinic. 11)  Amlodipine Besylate 5 Mg Tabs (Amlodipine Besylate) .... Take One Tablet By Mouth Daily 12)  Prednisone 10 Mg Tabs (Prednisone) .... 3 Tabs Today, 2 Tabs Tomorrow and 1 Tab Once Daily X 3  Allergies (verified): 1)  ! Iodine 2)  ! Codeine  Past History:  Past Medical History: Reviewed history from 10/16/2008 and no changes required.  GASTROESOPHAGEAL REFLUX DISEASE (ICD-530.81) FIBRILLATION, ATRIAL (ICD-427.31) AORTIC STENOSIS, MILD (ICD-424.1) CAD (ICD-414.00) HYPERTENSION (ICD-401.9 1. Coronary artery disease, status post coronary bypass graft surgery,     1997. 2. Atrial fibrillation, now holding sinus rhythm on Amiodarone. 3. 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 4. Mild aortic stenosis with a mean aortic valve gradient of 12 mmHg. 5. Hypertension. 6. Hyperlipidemia. 7. Facial lesions which may represent actinic keratoses and possible     photosensitivity from Amiodarone.   Past Surgical History: Reviewed history from 10/18/2010 and no changes required. Tonsillectomy Coronary artery bypass graft Cholecystectomy  Nov 18 2006 PROCEDURE:  DC cardioversion.   The patient has been  anticoagulated with therapeutic INRs on Coumadin for  greater than 3 weeks.  He has been on rate control medication.  He is  brought in today for cardioversion.   Procedure was performed with AP paddles with 120 watt seconds.  He was given  250 mg of pentothal by Dr. Jacklynn Bue.  He converted on the first shock to  sinus rhythm.  There were no immediate  complications.  Family History: Reviewed history from 10/18/2010 and no changes required. Mother died of CVA at age 44. Father died of MI at age 61. He has one brother who has a history of atrial fibrillation. Family History of Crohn's: Mother Family History of Heart Disease: Father, Uncle Family History of Prostate Cancer: Uncle  Social History: Reviewed history from 05/22/2009 and no changes required. Married Does not smoke Alcohol use-no Drug use-no Regular exercise-yes  Review of Systems       The patient complains of hearing problems and urination - excessive.  The patient denies allergy/sinus, anemia, anxiety-new, arthritis/joint pain, back pain, blood in urine, breast changes/lumps, change in vision, confusion, cough, coughing up blood, depression-new, fainting, fatigue, fever, headaches-new, heart murmur, heart rhythm changes, itching, menstrual pain, muscle pains/cramps, night sweats, nosebleeds, pregnancy symptoms, shortness of breath, skin rash, sleeping problems, sore throat, swelling of feet/legs, swollen lymph glands, thirst - excessive , urination - excessive , urination changes/pain, urine leakage, vision changes, and voice change.         Pertinent positive and negative review of systems were noted in the above HPI. All other ROS was otherwise negative.   Vital Signs:  Patient profile:   70 year old male Height:      62 inches Weight:      135 pounds BMI:     24.78 Pulse rate:   80 / minute Pulse rhythm:   regular BP sitting:   110 / 60  (left arm) Cuff size:   regular  Vitals Entered By: June McMurray CMA Duncan Dull) (October 25, 2010 2:54 PM)  Physical Exam  General:  Well developed, well nourished, no acute distress. Psych:  Alert and cooperative. Normal mood and affect.   Impression & Recommendations:  Problem # 1:  ACUTE AND CHRONIC CHOLECYSTITIS (ICD-575.12) Patient is status post recent laparoscopic cholecystectomy.He stopped his Coumadin 5 day ppror  to the chole.  Problem # 2:  ABDOMINAL PAIN, GENERALIZED (ICD-789.07)  Patient is currently asymptomatic. He is due for a recall colonoscopy. His last colonoscopy was completed in 2001. His exam will be completed off Coumadin which will be stopped 5 days prior to the procedure. I have discussed the procedure with the patient as well as the prep and the sedation.  Orders: Colonoscopy (Colon)  Patient Instructions: 1)  You have been scheduled for a screening colonoscopy on 11/03/10 at [facility]. You should discontinue Coumadin 5 days prior to the colonoscopy per Dr Lina Sar and follow all written instructions given to you at your office visit. 2)  Copy sent to : Dr B.Britaney Espaillat 3)  The medication list was reviewed and reconciled.  All changed / newly prescribed medications were explained.  A complete medication list was provided to the patient / caregiver. Prescriptions: DULCOLAX 5 MG  TBEC (BISACODYL) Day before procedure take 2 at 3pm and 2 at 8pm.  #4 x 0   Entered by:   Lamona Curl CMA (AAMA)   Authorized by:   Hart Carwin MD   Signed by:   Lamona Curl CMA (AAMA) on 10/25/2010  Method used:   Electronically to        [facility]* (retail)       [address]       [place], [place]  [address]       Ph: [phone]       Fax: [phone]   RxID:   [id number] REGLAN 10 MG  TABS (METOCLOPRAMIDE HCL) As per prep instructions.  #2 x 0   Entered by:   Lamona Curl CMA (AAMA)   Authorized by:   Hart Carwin MD   Signed by:   Lamona Curl CMA (AAMA) on 10/25/2010   Method used:   Electronically to        [facility]* (retail)       [address]       [place], [place]  [address]       Ph: [phone]       Fax: [phone]   RxID:   [id number] MIRALAX   POWD (POLYETHYLENE GLYCOL 3350) As per prep  instructions.  #255gm x 0   Entered by:   Lamona Curl CMA (AAMA)    Authorized by:   Hart Carwin MD   Signed by:   Lamona Curl CMA (AAMA) on 10/25/2010   Method used:   Electronically to        [facility]* (retail)       [address]       [place], [place]  [address]       Ph: [phone]       Fax: [phone]   RxID:   [id number]

## 2011-01-04 NOTE — Medication Information (Signed)
Summary: rov/cb  Anticoagulant Therapy  Managed by: Eda Keys, PharmD Referring MD: Charlies Constable MD PCP: Jacques Navy MD Supervising MD: Eden Emms MD, Theron Arista Indication 1: Atrial Fibrillation (ICD-427.31) Lab Used: [facility] Site: [facility] INR POC 2.2 INR RANGE 2 - 3  Dietary changes: no    Health status changes: no    Bleeding/hemorrhagic complications: yes       Details: epistaxis x 3 episodes, lasts 15-20 min, all three episodes were about two weeks ago.  Also, some bruisiong on hands.  Recent/future hospitalizations: no    Any changes in medication regimen? no    Recent/future dental: no  Any missed doses?: yes     Details: missed two doses about 2-3 weeks ago  Is patient compliant with meds? yes       Current Medications (verified): 1)  Amiodarone Hcl 200 Mg  Tabs (Amiodarone Hcl) .[name].. 1 By Mouth Once Daily 2)  Multivitamins   Tabs (Multiple Vitamin) .[name].. 1 By Mouth Once Daily 3)  Vitamin C 1000 Mg  Tabs (Ascorbic Acid) .[name].. 1 By Mouth Once Daily 4)  Lipitor 80 Mg Tabs (Atorvastatin Calcium) .... 1/2 Tab Once Daily 5)  Adult Aspirin Low Strength 81 Mg  Tbdp (Aspirin) .[name].. 1 By Mouth Once Daily 6)  Benicar 40 Mg  Tabs (Olmesartan Medoxomil) .[name].. 1 By Mouth Once Daily 7)  Protonix 40 Mg  Tbec (Pantoprazole Sodium) .[name].. 1  By Mouth As Needed 8)  Claritin 10 Mg  Tabs (Loratadine) .[name].. 1 By Mouth As Needed 9)  Nasacort Aq 55 Mcg/act  Aers (Triamcinolone Acetonide(Nasal)) .... As Needed 10)  Coumadin 5 Mg Tabs (Warfarin Sodium) .... Take As Directed By Coumadin Clinic. 11)  Amlodipine Besylate 5 Mg Tabs (Amlodipine Besylate) .... Take One Tablet By Mouth Daily  Allergies (verified): 1)  ! Iodine 2)  ! Codeine  Anticoagulation Management History:      The patient is taking warfarin and comes in today for a routine follow up visit.  Positive risk factors for bleeding include an age of 72 years or older.  Negative risk factors for bleeding include no history  of CVA/TIA.  The bleeding index is 'intermediate risk'.  Positive CHADS2 values include History of HTN.  Negative CHADS2 values include Age > 21 years old and Prior Stroke/CVA/TIA.  The start date was 10/14/2005.  His last INR was 1.4 ratio.  Anticoagulation responsible provider: Eden Emms MD, Theron Arista.  INR POC: 2.2.  Cuvette Lot#: [id number].  Exp: 07/2011.    Anticoagulation Management Assessment/Plan:      The patient's current anticoagulation dose is Coumadin 5 mg tabs: Take as directed by coumadin clinic..  The target INR is 2 - 3.  The next INR is due 05/11/2010.  Anticoagulation instructions were given to patient.  Results were reviewed/authorized by Eda Keys, PharmD.  He was notified by Eda Keys.         Prior Anticoagulation Instructions: INR 2.1. Take 0.5 tablet daily except 1 tablet on Mondays.  Current Anticoagulation Instructions: INR 2.2  Continue taking 1 tablet on Monday and 1/2 tablet all other days.  Return to clinic in 4 weeks.

## 2011-01-04 NOTE — Progress Notes (Signed)
   Faxed Stress,Resting over to [facility] fax [phone] [facility]  February 17, 2010 9:28 AM

## 2011-01-04 NOTE — Medication Information (Signed)
Summary: rov/tm  Anticoagulant Therapy  Managed by: Weston Brass, PharmD Referring MD: Charlies Constable MD PCP: Jacques Navy MD Supervising MD: Juanda Chance MD, Shaeley Segall Indication 1: Atrial Fibrillation (ICD-427.31) Lab Used: [facility] Site: [facility] INR POC 1.5 INR RANGE 2 - 3  Dietary changes: no    Health status changes: no    Bleeding/hemorrhagic complications: no    Recent/future hospitalizations: no    Any changes in medication regimen? no    Recent/future dental: no  Any missed doses?: no       Is patient compliant with meds? yes       Allergies: 1)  ! Iodine 2)  ! Codeine  Anticoagulation Management History:      The patient is taking warfarin and comes in today for a routine follow up visit.  Positive risk factors for bleeding include an age of 72 years or older.  Negative risk factors for bleeding include no history of CVA/TIA.  The bleeding index is 'intermediate risk'.  Positive CHADS2 values include History of HTN.  Negative CHADS2 values include Age > 72 years old and Prior Stroke/CVA/TIA.  The start date was 10/14/2005.  His last INR was 1.4 ratio.  Anticoagulation responsible provider: Juanda Chance MD, Smitty Cords.  INR POC: 1.5.  Cuvette Lot#: [id number].  Exp: 09/2011.    Anticoagulation Management Assessment/Plan:      The patient's current anticoagulation dose is Coumadin 5 mg tabs: Take as directed by coumadin clinic..  The target INR is 2 - 3.  The next INR is due 07/06/2010.  Anticoagulation instructions were given to patient.  Results were reviewed/authorized by Weston Brass, PharmD.  He was notified by Dillard Cannon.         Prior Anticoagulation Instructions: INR 2.5 Continue 1/2 pill everyday except 1 pill on Mondays. Recheck in 4 weeks   Current Anticoagulation Instructions: INR 1.5  Take an extra 1/2 today and then take 1 tab tomorrow.  Then resume normal regimen of 1/2 tab daily except for 1 tab on Monday.  Re-check in 1 week.

## 2011-01-04 NOTE — Progress Notes (Signed)
Summary: call report / MEN pt  Phone Note Other Incoming   Caller: [facility] Call Report Summary of Call: [facility] Triage Call Report Triage Record Num: [id number] Operator: Jacques Navy Patient Name: Jesus [name] Call Date & Time: 01/30/2010 81:19:[id number] Patient Phone: [phone] PCP: Illene Regulus Patient Gender: Male PCP Fax : [phone] Patient DOB: April 01, 1941 Practice Name: [facility] Reason for Call: Wife/Jesus [name] calling about fever and laying down all day. Onset 01-29-10. Temp 102.7 oral. Currently on antibiotics and white count high. Loss of appetite, but drinking well. Urinating some. Activity decreased, sleeping on and off all day. Paged Dr.Fry @ [phone] 01-30-10. Orders received to go to [facility] ER now. Instructions given to wife. Protocol(s) Used: Fever - Adult Recommended Outcome per Protocol: Call Provider Immediately Reason for Outcome: Frail elderly, immunocompromised or pregnant person with temperature of 100.5 F (38.0 C) or higher Care Advice: Consider acetaminophen as directed on label or by pharmacist/provider for pain or fever PRECAUTIONS: - If there is no history of liver disease, alcoholism, or intake of three or more alcohol drinks per day - If approved by provider during pregnancy or when breastfeeding - During pregnancy, acetaminophen should not be taken more than 3 consecutive days without telling provider - Do not exceed recommended dose or frequency  ~ Drink 6-10 eight ounce glasses (1.2-2.0 liters) of fluids per day unless previously instructed to restrict fluid intake for other medical reasons. Limit fluids that contain caffeine, sugar or alcohol.  ~  ~ Go to ED immediately if unable to talk with a provider within 1 hour. Another adult should drive. Analgesic Advice: Consider aspirin, ibuprofen, naproxen or ketoprofen for pain or fever as directed on label or by pharmacist/provider. PRECAUTION: - If over 49 years of age, should not take  longer than 1 week without consulting provider. EXCEPTIONS: - Should not be used if taking blood thinners.   Initial call taken by: Margaret Pyle, CMA,  February 01, 2010 8:15 AM

## 2011-01-04 NOTE — Letter (Signed)
Summary: Handout Printed  Printed Handout:  - Coumadin Instructions-w/out Meds 

## 2011-01-04 NOTE — Assessment & Plan Note (Signed)
Summary: PER MD DOUBLEBOOK--FEVER 101.6---STC   Vital Signs:  Patient profile:   70 year old male Weight:      132.50 pounds Temp:     99.3 degrees F Pulse rate:   80 / minute BP sitting:   126 / 82  (left arm)  Vitals Entered By: Lamar Sprinkles, CMA (December 31, 2009 5:27 PM)  Primary Care Provider:  Samuella Rasool   History of Present Illness: Patient seen several weeks ago for URI treated with 7 day course of doxycycline. He has called back with persisten vs recurrent URI symptoms with cough and congestion. Today his wife reports fever to 102.3 F along with his cough. He feels weak. No SOB or resiratory distress. He is seen acutely.  Allergies: 1)  ! Iodine 2)  ! Codeine PMH-FH-SH reviewed-no changes except otherwise noted  Review of Systems       The patient complains of fever, hoarseness, and prolonged cough.  The patient denies anorexia, weight loss, decreased hearing, chest pain, syncope, dyspnea on exertion, peripheral edema, hemoptysis, abdominal pain, hematuria, difficulty walking, abnormal bleeding, and enlarged lymph nodes.    Physical Exam  General:  WNWD white male who is looking ill but is no acute distress. Head:  Normocephalic and atraumatic without obvious abnormalities. No apparent alopecia or balding. Eyes:  pupils equal, pupils round, corneas and lenses clear, and no injection.   Ears:  TMs are normal Nose:  no external deformity and no external erythema.   Neck:  full ROM.   Lungs:  normal respiratory effort, normal breath sounds, no dullness, no crackles, and no wheezes.   Heart:  normal rate and regular rhythm.   Msk:  no joint tenderness and no joint swelling.   Pulses:  2+ radial Neurologic:  alert & oriented X3, cranial nerves II-XII intact, and gait normal.   Skin:  turgor normal, color normal, no purpura, and no ulcerations.   Cervical Nodes:  no anterior cervical adenopathy and no posterior cervical adenopathy.   Psych:  Oriented X3, memory intact for  recent and remote, and good eye contact.     Impression & Recommendations:  Problem # 1:  URI (ICD-465.9) No evidence of PNA or respirtory distress - no increased work of breathing or DOE.  Plan - levaquin 500mg  once daily x 5           supportive care.   His updated medication list for this problem includes:    Adult Aspirin Low Strength 81 Mg Tbdp (Aspirin) .[name]... 1 by mouth once daily    Claritin 10 Mg Tabs (Loratadine) .[name]... 1 by mouth as needed  Complete Medication List: 1)  Amiodarone Hcl 200 Mg Tabs (Amiodarone hcl) .[name].. 1 by mouth once daily 2)  Multivitamins Tabs (Multiple vitamin) .[name].. 1 by mouth once daily 3)  Vitamin C 1000 Mg Tabs (Ascorbic acid) .[name].. 1 by mouth once daily 4)  Lipitor 80 Mg Tabs (Atorvastatin calcium) .... 1/2 tab once daily 5)  Adult Aspirin Low Strength 81 Mg Tbdp (Aspirin) .[name].. 1 by mouth once daily 6)  Benicar 40 Mg Tabs (Olmesartan medoxomil) .[name].. 1 by mouth once daily 7)  Protonix 40 Mg Tbec (Pantoprazole sodium) .[name].. 1  by mouth as needed 8)  Claritin 10 Mg Tabs (Loratadine) .[name].. 1 by mouth as needed 9)  Nasacort Aq 55 Mcg/act Aers (Triamcinolone acetonide(nasal)) .... As needed 10)  Coumadin 5 Mg Tabs (Warfarin sodium) .... Take as directed by coumadin clinic. 11)  Amlodipine Besylate 5 Mg  Tabs (Amlodipine besylate) .... Take one tablet by mouth daily 12)  Doxycycline Hyclate 100 Mg Caps (Doxycycline hyclate) .[name].. 1 by mouth two times a day x 7 for bronchitis

## 2011-01-04 NOTE — Assessment & Plan Note (Signed)
Summary: flu shot/pneumonia shot/lb pt req shingles/la   Nurse Visit   Allergies: 1)  ! Iodine 2)  ! Codeine  Orders Added: 1)  Flu Vaccine 56yrs + MEDICARE PATIENTS [Q2039] 2)  Administration Flu vaccine - MCR [G0008]      Flu Vaccine Consent Questions     Do you have a history of severe allergic reactions to this vaccine? no    Any prior history of allergic reactions to egg and/or gelatin? no    Do you have a sensitivity to the preservative Thimersol? no    Do you have a past history of Guillan-Barre Syndrome? no    Do you currently have an acute febrile illness? no    Have you ever had a severe reaction to latex? no    Vaccine information given and explained to patient? yes    Are you currently pregnant? no    Lot Number:[id number]   Exp Date:06/04/2011   Site Given  Left Deltoid IMu     Patient does not need pneumonia and has not checked w/insurance regarding zostavax.

## 2011-01-04 NOTE — Letter (Signed)
Summary: [facility] Instructions  [facility]  [address] [place], [place] [address]   Phone: [phone]  Fax: [phone]       Yesenia Schermer    [birth year]    MRN: [id number]       Procedure Day Dorna Bloom: Wednesday 11/03/10     Arrival Time: 8:30 am     Procedure Time: 9:30 am     Location of Procedure:                    _x _  [facility] (4th Floor)  PREPARATION FOR COLONOSCOPY WITH MIRALAX  Starting 5 days prior to your procedure 10/29/10 do not eat nuts, seeds, popcorn, corn, beans, peas,  salads, or any raw vegetables.  Do not take any fiber supplements (e.g. Metamucil, Citrucel, and Benefiber). ____________________________________________________________________________________________________   THE DAY BEFORE YOUR PROCEDURE         DATE: 11/02/10 DAY: Tuesday  1   Drink clear liquids the entire day-NO SOLID FOOD  2   Do not drink anything colored red or purple.  Avoid juices with pulp.  No orange juice.  3   Drink at least 64 oz. (8 glasses) of fluid/clear liquids during the day to prevent dehydration and help the prep work efficiently.  CLEAR LIQUIDS INCLUDE: Water Jello Ice Popsicles Tea (sugar ok, no milk/cream) Powdered fruit flavored drinks Coffee (sugar ok, no milk/cream) Gatorade Juice: apple, white grape, white cranberry  Lemonade Clear bullion, consomm, broth Carbonated beverages (any kind) Strained chicken noodle soup Hard Candy  4   Mix the entire bottle of Miralax with 64 oz. of Gatorade/Powerade in the morning and put in the refrigerator to chill.  5   At 3:00 pm take 2 Dulcolax/Bisacodyl tablets.  6   At 4:30 pm take one Reglan/Metoclopramide tablet.  7  Starting at 5:00 pm drink one 8 oz glass of the Miralax mixture every 15-20 minutes until you have finished drinking the entire 64 oz.  You should finish drinking prep around 7:30 or 8:00 pm.  8   If you are nauseated, you may take the 2nd Reglan/Metoclopramide  tablet at 6:30 pm.        9    At 8:00 pm take 2 more DULCOLAX/Bisacodyl tablets.         THE DAY OF YOUR PROCEDURE      DATE:  11/03/10 DAY: Wednesday  You may drink clear liquids until 7:30 am  (2 HOURS BEFORE PROCEDURE).   MEDICATION INSTRUCTIONS  Unless otherwise instructed, you should take regular prescription medications with a small sip of water as early as possible the morning of your procedure.            Stop taking Coumadin on  10/29/10 per Dr Lina Sar (5 days before procedure).          OTHER INSTRUCTIONS  You will need a responsible adult at least 72 years of age to accompany you and drive you home.   This person must remain in the waiting room during your procedure.  Wear loose fitting clothing that is easily removed.  Leave jewelry and other valuables at home.  However, you may wish to bring a book to read or an iPod/MP3 player to listen to music as you wait for your procedure to start.  Remove all body piercing jewelry and leave at home.  Total time from sign-in until discharge is approximately 2-3 hours.  You should go home directly after your procedure  and rest.  You can resume normal activities the day after your procedure.  The day of your procedure you should not:   Drive   Make legal decisions   Operate machinery   Drink alcohol   Return to work  You will receive specific instructions about eating, activities and medications before you leave.   The above instructions have been reviewed and explained to me by   Lamona Curl CMA Duncan Dull)  October 25, 2010 3:51 PM     I fully understand and can verbalize these instructions _____________________________ Date _______

## 2011-01-04 NOTE — Letter (Signed)
Summary: New Patient letter  [facility]  [address] [place], [place] [address]   Phone: [phone]  Fax: [phone]       08/16/2010 MRN: [id number]  Jesus [name] [address] [place], [place]  [address]  Dear [name],  Welcome to the Gastroenterology Division at [facility].    You are scheduled to see Dr.  Juanda Chance on 10-25-10 at 2:45P.M. on the 3rd floor at [facility], [address]  We ask that you try to arrive at our office 15 minutes prior to your appointment time to allow for check-in.  We would like you to complete the enclosed self-administered evaluation form prior to your visit and bring it with you on the day of your appointment.  We will review it with you.  Also, please bring a complete list of all your medications or, if you prefer, bring the medication bottles and we will list them.  Please bring your insurance card so that we may make a copy of it.  If your insurance requires a referral to see a specialist, please bring your referral form from your primary care physician.  Co-payments are due at the time of your visit and may be paid by cash, check or credit card.     Your office visit will consist of a consult with your physician (includes a physical exam), any laboratory testing he/she may order, scheduling of any necessary diagnostic testing (e.g. x-ray, ultrasound, CT-scan), and scheduling of a procedure (e.g. Endoscopy, Colonoscopy) if required.  Please allow enough time on your schedule to allow for any/all of these possibilities.    If you cannot keep your appointment, please call [phone] to cancel or reschedule prior to your appointment date.  This allows [place] the opportunity to schedule an appointment for another patient in need of care.  If you do not cancel or reschedule by 5 p.m. the business day prior to your appointment date, you will be charged a $50.00 late cancellation/no-show fee.    Thank you for choosing  [facility] for your medical needs.  We appreciate the opportunity to care for you.  Please visit [place] at our website  to learn more about our practice.                     Sincerely,                                                             The Gastroenterology Division

## 2011-01-04 NOTE — Assessment & Plan Note (Signed)
Summary: BEE STING/NWS   Vital Signs:  Patient profile:   70 year old male Height:      62 inches Weight:      134 pounds BMI:     24.60 O2 Sat:      98 % on Room air Temp:     98.1 degrees F oral Pulse rate:   63 / minute BP sitting:   122 / 72  (left arm) Cuff size:   regular  Vitals Entered By: Bill Salinas CMA (September 21, 2010 1:52 PM)  O2 Flow:  Room air CC: bee sting on left hand with swelling and redness. He also has c/o dry cough which he states started last night./ ab   Primary Care Provider:  Jacques Navy MD  CC:  bee sting on left hand with swelling and redness. He also has c/o dry cough which he states started last night./ ab.  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 It is somewhat better now - several hours later- but still red and warm. He has had nor prior reactions and he has no respiratory symptoms.  He c/o minor cold with congestion, no fever, dry throat, dry cough.   Sanctura XR has caused hard stools and dryness which has lead to bright red blood on stool, most likely hemorrhoidal source.   Current Medications (verified): 1)  Amiodarone Hcl 200 Mg  Tabs (Amiodarone Hcl) .... Take 1/2 Tablet By Mouth Once Daily 2)  Multivitamins   Tabs (Multiple Vitamin) .[name].. 1 By Mouth Once Daily 3)  Vitamin C 1000 Mg  Tabs (Ascorbic Acid) .[name].. 1 By Mouth Once Daily 4)  Lipitor 80 Mg Tabs (Atorvastatin Calcium) .... 1/2 Tab Once Daily 5)  Adult Aspirin Low Strength 81 Mg  Tbdp (Aspirin) .[name].. 1 By Mouth Once Daily 6)  Benicar 40 Mg  Tabs (Olmesartan Medoxomil) .[name].. 1 By Mouth Once Daily 7)  Protonix 40 Mg  Tbec (Pantoprazole Sodium) .[name].. 1  By Mouth As Needed 8)  Claritin 10 Mg  Tabs (Loratadine) .[name].. 1 By Mouth As Needed 9)  Nasacort Aq 55 Mcg/act  Aers (Triamcinolone Acetonide(Nasal)) .... As Needed 10)  Coumadin 5 Mg Tabs (Warfarin Sodium) .... Take As Directed  By Coumadin Clinic. 11)  Amlodipine Besylate 5 Mg Tabs (Amlodipine Besylate) .... Take One Tablet By Mouth Daily 12)  Sanctura Xr 60 Mg Xr24h-Cap (Trospium Chloride) .[name].. 1 Capsule Daily For A One Month Trial  Allergies (verified): 1)  ! Iodine 2)  ! Codeine PMH-FH-SH reviewed-no changes except otherwise noted  Review of Systems  The patient denies anorexia, fever, chest pain, dyspnea on exertion, headaches, abdominal pain, muscle weakness, enlarged lymph nodes, and angioedema.    Physical Exam  General:  Well-developed,well-nourished,in no acute distress; alert,appropriate and cooperative throughout examination Head:  normocephalic and atraumatic.   Eyes:  C&S clear Lungs:  normal respiratory effort, normal breath sounds, no crackles, and no wheezes.   Heart:  normal rate and regular rhythm.   Skin:  Left hand with erythema and swelling, heat and mild tenderness   Impression & Recommendations:  Problem # 1:  BEE STING REACTION, LOCAL (ICD-989.5) patient with multiple yellow jacket stings to left hand followed by swelling. No respiratory symptoms  Plan - cautioned about possible accelerated reaction to future hymenoptra envenomation          short course of steroids for reaction.   Complete Medication  List: 1)  Amiodarone Hcl 200 Mg Tabs (Amiodarone hcl) .... Take 1/2 tablet by mouth once daily 2)  Multivitamins Tabs (Multiple vitamin) .[name].. 1 by mouth once daily 3)  Vitamin C 1000 Mg Tabs (Ascorbic acid) .[name].. 1 by mouth once daily 4)  Lipitor 80 Mg Tabs (Atorvastatin calcium) .... 1/2 tab once daily 5)  Adult Aspirin Low Strength 81 Mg Tbdp (Aspirin) .[name].. 1 by mouth once daily 6)  Benicar 40 Mg Tabs (Olmesartan medoxomil) .[name].. 1 by mouth once daily 7)  Protonix 40 Mg Tbec (Pantoprazole sodium) .[name].. 1  by mouth as needed 8)  Claritin 10 Mg Tabs (Loratadine) .[name].. 1 by mouth as needed 9)  Nasacort Aq 55 Mcg/act Aers (Triamcinolone acetonide(nasal)) .... As needed 10)  Coumadin 5  Mg Tabs (Warfarin sodium) .... Take as directed by coumadin clinic. 11)  Amlodipine Besylate 5 Mg Tabs (Amlodipine besylate) .... Take one tablet by mouth daily 12)  Sanctura Xr 60 Mg Xr24h-cap (Trospium chloride) .[name].. 1 capsule daily for a one month trial 13)  Prednisone 10 Mg Tabs (Prednisone) .... 3 tabs today, 2 tabs tomorrow and 1 tab once daily x 3 Prescriptions: PREDNISONE 10 MG TABS (PREDNISONE) 3 tabs today, 2 tabs tomorrow and 1 tab once daily x 3  #8 x 0   Entered and Authorized by:   Jacques Navy MD   Signed by:   Jacques Navy MD on 09/22/2010   Method used:   Electronically to        [facility] (retail)       [address]       [place], [place]  [address]       Ph: [phone]       Fax: [phone]   RxID:   [id number]    Orders Added: 1)  Est. Patient Level III [42595]

## 2011-01-04 NOTE — Letter (Signed)
Summary: Colonoscopy Letter  [facility]  [address] [place], [place] [address]   Phone: [phone]  Fax: [phone]      August 11, 2010 MRN: [id number]   Jesus [name] [address] [place], [place]  [address]   Dear Jesus [name],   According to your medical record, it is time for you to schedule a Colonoscopy. The American Cancer Society recommends this procedure as a method to detect early colon cancer. Patients with a family history of colon cancer, or a personal history of colon polyps or inflammatory bowel disease are at increased risk.  This letter has beeen generated based on the recommendations made at the time of your procedure. If you feel that in your particular situation this may no longer apply, please contact our office.  Please call our office at [phone] to schedule this appointment or to update your records at your earliest convenience.  Thank you for cooperating with [place] to provide you with the very best care possible.   Sincerely,  Hedwig Morton. Juanda Chance, M.D.  [facility] [phone]

## 2011-01-04 NOTE — Assessment & Plan Note (Signed)
Summary: LEG WEAKNESS/PER SARAH/CD   Vital Signs:  Patient profile:   70 year old male Height:      62 inches Weight:      135 pounds BMI:     24.78 O2 Sat:      97 % on Room air Temp:     97.9 degrees F oral Pulse rate:   57 / minute BP sitting:   118 / 64  (left arm) Cuff size:   regular  Vitals Entered By: Bill Salinas CMA (September 16, 2010 2:01 PM)  O2 Flow:  Room air CC: pt here with c/o right hip pain/ ab   Primary Care Benjaman Artman:  Jacques Navy MD  CC:  pt here with c/o right hip pain/ ab.  History of Present Illness: Two week h/o right back pain above the crest of the hip on the right. He associates this with having done some yardwork. In addition he has had two falls but this did not make the back pain worse. No pareesthesia or weakness in the legs, although his wife reports that he has been stumbling a bit. He has been going to the gym. No incontinence of bowel or bladder.   Current Medications (verified): 1)  Amiodarone Hcl 200 Mg  Tabs (Amiodarone Hcl) .... Take 1/2 Tablet By Mouth Once Daily 2)  Multivitamins   Tabs (Multiple Vitamin) .[name].. 1 By Mouth Once Daily 3)  Vitamin C 1000 Mg  Tabs (Ascorbic Acid) .[name].. 1 By Mouth Once Daily 4)  Lipitor 80 Mg Tabs (Atorvastatin Calcium) .... 1/2 Tab Once Daily 5)  Adult Aspirin Low Strength 81 Mg  Tbdp (Aspirin) .[name].. 1 By Mouth Once Daily 6)  Benicar 40 Mg  Tabs (Olmesartan Medoxomil) .[name].. 1 By Mouth Once Daily 7)  Protonix 40 Mg  Tbec (Pantoprazole Sodium) .[name].. 1  By Mouth As Needed 8)  Claritin 10 Mg  Tabs (Loratadine) .[name].. 1 By Mouth As Needed 9)  Nasacort Aq 55 Mcg/act  Aers (Triamcinolone Acetonide(Nasal)) .... As Needed 10)  Coumadin 5 Mg Tabs (Warfarin Sodium) .... Take As Directed By Coumadin Clinic. 11)  Amlodipine Besylate 5 Mg Tabs (Amlodipine Besylate) .... Take One Tablet By Mouth Daily 12)  Sanctura Xr 60 Mg Xr24h-Cap (Trospium Chloride) .[name].. 1 Capsule Daily For A One Month Trial  Allergies (verified): 1)   ! Iodine 2)  ! Codeine  Past History:  Past Medical History: Last updated: 10/16/2008  GASTROESOPHAGEAL REFLUX DISEASE (ICD-530.81) FIBRILLATION, ATRIAL (ICD-427.31) AORTIC STENOSIS, MILD (ICD-424.1) CAD (ICD-414.00) HYPERTENSION (ICD-401.9 1. Coronary artery disease, status post coronary bypass graft surgery,     1997. 2. Atrial fibrillation, now holding sinus rhythm on Amiodarone. 3. 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 4. Mild aortic stenosis with a mean aortic valve gradient of 12 mmHg. 5. Hypertension. 6. Hyperlipidemia. 7. Facial lesions which may represent actinic keratoses and possible     photosensitivity from Amiodarone.   Past Surgical History: Last updated: 04/20/2009 Tonsillectomy Coronary artery bypass graft Nov 18 2006 PROCEDURE:  DC cardioversion.   The patient has been anticoagulated with therapeutic INRs on Coumadin for  greater than 3 weeks.  He has been on rate control medication.  He is  brought in today for cardioversion.   Procedure was performed with AP paddles with 120 watt seconds.  He was given  250 mg of pentothal by Dr. Jacklynn Bue.  He converted on the first shock to  sinus rhythm.  There were no immediate complications.  Family History: Last updated: 06/24/2009 Mother died of CVA at age 62. Father died of MI at age 64. He has one brother who has a history of atrial fibrillation.  Social History: Last updated: 05/22/2009 Married Does not smoke Alcohol use-no Drug use-no Regular exercise-yes  Review of Systems  The patient denies anorexia, fever, weight loss, vision loss, hoarseness, chest pain, syncope, dyspnea on exertion, prolonged cough, abdominal pain, muscle weakness, difficulty walking, unusual weight change, enlarged lymph nodes, and angioedema.    Physical Exam  General:  Well-developed,well-nourished,in no acute distress; alert,appropriate and cooperative  throughout examination Head:  normocephalic and atraumatic.   Eyes:  pupils equal and pupils round.   Neck:  supple and full ROM.   Chest Wall:  kyphoscoliosis noted Lungs:  normal respiratory effort and normal breath sounds.   Heart:  normal rate and regular rhythm.   Abdomen:  soft and normal bowel sounds.   Msk:  back exam: able to stand without assist, flex to 160 degrees, gait with minimal atalgic gait favoring right leg, nl toe/heel walk, able to step up to exam table. SLR sitting is normal, DTRs patellar tendon is normal. No tenderness with internla or external rotation of the hip. MIld CVAT right low back. Sensation grossly normal LE Pulses:  2+ radial Neurologic:  alert & oriented X3 and cranial nerves II-XII intact.   Skin:  turgor normal and color normal.   Psych:  Oriented X3, normally interactive, and good eye contact.     Impression & Recommendations:  Problem # 1:  LUMBAR STRAIN, ACUTE (ICD-847.2) Patient with low back pain associated with yard work. Exam is negative for radicular symptoms.  Plan - NSAIDs short term for relief           topical lineament, e.g. aspercreme  Complete Medication List: 1)  Amiodarone Hcl 200 Mg Tabs (Amiodarone hcl) .... Take 1/2 tablet by mouth once daily 2)  Multivitamins Tabs (Multiple vitamin) .[name].. 1 by mouth once daily 3)  Vitamin C 1000 Mg Tabs (Ascorbic acid) .[name].. 1 by mouth once daily 4)  Lipitor 80 Mg Tabs (Atorvastatin calcium) .... 1/2 tab once daily 5)  Adult Aspirin Low Strength 81 Mg Tbdp (Aspirin) .[name].. 1 by mouth once daily 6)  Benicar 40 Mg Tabs (Olmesartan medoxomil) .[name].. 1 by mouth once daily 7)  Protonix 40 Mg Tbec (Pantoprazole sodium) .[name].. 1  by mouth as needed 8)  Claritin 10 Mg Tabs (Loratadine) .[name].. 1 by mouth as needed 9)  Nasacort Aq 55 Mcg/act Aers (Triamcinolone acetonide(nasal)) .... As needed 10)  Coumadin 5 Mg Tabs (Warfarin sodium) .... Take as directed by coumadin clinic. 11)  Amlodipine Besylate 5 Mg Tabs  (Amlodipine besylate) .... Take one tablet by mouth daily 12)  Sanctura Xr 60 Mg Xr24h-cap (Trospium chloride) .[name].. 1 capsule daily for a one month trial

## 2011-01-04 NOTE — Progress Notes (Signed)
  Phone Note Other Incoming   Caller: Pt Summary of Call: Pt called and states the flomax is not working for him. Pt states he still get up during the night 3 to 4 times. Is there any other alternative or what do you suggest? Pt wants to know if he should see a specialist. 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

## 2011-01-04 NOTE — Medication Information (Signed)
Summary: rov/tm  Anticoagulant Therapy  Managed by: Bethena Midget, RN, BSN Referring MD: Charlies Constable MD PCP: Jacques Navy MD Supervising MD: Tenny Craw MD, Gunnar Fusi Indication 1: Atrial Fibrillation (ICD-427.31) Lab Used: [facility] Site: [facility] INR POC 1.6 INR RANGE 2 - 3  Dietary changes: no    Health status changes: no    Bleeding/hemorrhagic complications: yes       Details: Notice blood in stool this morning since that time just alittle once at lunch. Pt states he was straining this AM because he was constipated.  Also nose bleed from Rt nare when he blew his nose this morning.   Recent/future hospitalizations: no    Any changes in medication regimen? yes       Details: Amiordone dose decrease from 200mg  daily to 100mg s last week.   Recent/future dental: no  Any missed doses?: no       Is patient compliant with meds? yes      Comments: Pt encouraged to use benefiber or colace for constipation and follow up with PCP for further bleeding. Wife and pt verbalized understanding.   Allergies: 1)  ! Iodine 2)  ! Codeine  Anticoagulation Management History:      The patient is taking warfarin and comes in today for a routine follow up visit.  Positive risk factors for bleeding include an age of 72 years or older.  Negative risk factors for bleeding include no history of CVA/TIA.  The bleeding index is 'intermediate risk'.  Positive CHADS2 values include History of HTN.  Negative CHADS2 values include Age > 22 years old and Prior Stroke/CVA/TIA.  The start date was 10/14/2005.  His last INR was 1.4 ratio.  Anticoagulation responsible provider: Tenny Craw MD, Gunnar Fusi.  INR POC: 1.6.  Cuvette Lot#: [id number].  Exp: 09/2011.    Anticoagulation Management Assessment/Plan:      The patient's current anticoagulation dose is Coumadin 5 mg tabs: Take as directed by coumadin clinic..  The target INR is 2 - 3.  The next INR is due 07/27/2010.  Anticoagulation instructions were given to patient.   Results were reviewed/authorized by Bethena Midget, RN, BSN.  He was notified by Bethena Midget, RN, BSN.         Prior Anticoagulation Instructions: INR 2.0  No changes in dosage.  Continue 1/2 tablet daily except for 1 tablet Mondays.  Recheck in 3 weeks.  Current Anticoagulation Instructions: INR 1.6 Today take an extra 2.5mg s  then change dose to 2.5mg s everyday except 5mg s on Mondays and Fridays. Recheck in 11 days.

## 2011-01-04 NOTE — Progress Notes (Signed)
Summary: Samples  Phone Note Call from Patient   Summary of Call: Patient is requesting samples of lipitor and benicar. He will be in the office tomorrow for flu clinic.  Initial call taken by: Lamar Sprinkles, CMA,  September 13, 2010 9:13 AM  Follow-up for Phone Call        Spoke with patient, will pick up Benicar, no lipitor avail.Alvy Beal Archie CMA  September 13, 2010 9:48 AM   Patient also stated that he will be in for a flu shot tomarrow and request to have zostavax as well.Marland KitchenMarland KitchenAlvy Beal Archie CMA  September 13, 2010 9:50 AM

## 2011-01-04 NOTE — Assessment & Plan Note (Signed)
Summary: unable to sleep/cd   Vital Signs:  Patient profile:   70 year old male Height:      62 inches Weight:      135 pounds BMI:     24.78 O2 Sat:      97 % on Room air Temp:     100.0 degrees F oral Pulse rate:   82 / minute BP sitting:   126 / 80  (left arm) Cuff size:   regular  Vitals Entered By: Bill Salinas CMA (December 10, 2009 2:31 PM)  O2 Flow:  Room air CC: pt c/o coughing with weakness and fatigue with fever. Pt also c/o drainage but his main concern is coughing spells that tend to be worse at night / pt is due for tetanus/ ab   Primary Care Provider:  Creola Krotz  CC:  pt c/o coughing with weakness and fatigue with fever. Pt also c/o drainage but his main concern is coughing spells that tend to be worse at night / pt is due for tetanus/ ab.  History of Present Illness: Patient presents with cough, sinus pressure and low grade fever. He has had bronchitis in the past. He denies SOB, purulent drainage or sputum, N/V. He has had no weight change or night sweats.   Current Medications (verified): 1)  Amiodarone Hcl 200 Mg  Tabs (Amiodarone Hcl) .[name].. 1 By Mouth Once Daily 2)  Multivitamins   Tabs (Multiple Vitamin) .[name].. 1 By Mouth Once Daily 3)  Vitamin C 1000 Mg  Tabs (Ascorbic Acid) .[name].. 1 By Mouth Once Daily 4)  Lipitor 80 Mg Tabs (Atorvastatin Calcium) .... 1/2 Tab Once Daily 5)  Adult Aspirin Low Strength 81 Mg  Tbdp (Aspirin) .[name].. 1 By Mouth Once Daily 6)  Benicar 40 Mg  Tabs (Olmesartan Medoxomil) .[name].. 1 By Mouth Once Daily 7)  Protonix 40 Mg  Tbec (Pantoprazole Sodium) .[name].. 1  By Mouth As Needed 8)  Claritin 10 Mg  Tabs (Loratadine) .[name].. 1 By Mouth As Needed 9)  Nasacort Aq 55 Mcg/act  Aers (Triamcinolone Acetonide(Nasal)) .... As Needed 10)  Coumadin 5 Mg Tabs (Warfarin Sodium) .... Take As Directed By Coumadin Clinic. 11)  Amlodipine Besylate 5 Mg Tabs (Amlodipine Besylate) .... Take One Tablet By Mouth Daily  Allergies (verified): 1)  ! Iodine 2)  !  Codeine  Past History:  Past Medical History: Last updated: 10/16/2008  GASTROESOPHAGEAL REFLUX DISEASE (ICD-530.81) FIBRILLATION, ATRIAL (ICD-427.31) AORTIC STENOSIS, MILD (ICD-424.1) CAD (ICD-414.00) HYPERTENSION (ICD-401.9 1. Coronary artery disease, status post coronary bypass graft surgery,     1997. 2. Atrial fibrillation, now holding sinus rhythm on Amiodarone. 3. 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 4. Mild aortic stenosis with a mean aortic valve gradient of 12 mmHg. 5. Hypertension. 6. Hyperlipidemia. 7. Facial lesions which may represent actinic keratoses and possible     photosensitivity from Amiodarone.   Past Surgical History: Last updated: 04/20/2009 Tonsillectomy Coronary artery bypass graft Nov 18 2006 PROCEDURE:  DC cardioversion.   The patient has been anticoagulated with therapeutic INRs on Coumadin for  greater than 3 weeks.  He has been on rate control medication.  He is  brought in today for cardioversion.   Procedure was performed with AP paddles with 120 watt seconds.  He was given  250 mg of pentothal by Dr. Jacklynn Bue.  He converted on the first shock to  sinus rhythm.  There were no immediate complications.  Family History: Last updated: 07-04-09 Mother died of CVA at age 24. Father died of MI at age 38. He has one brother who has a history of atrial fibrillation.  Social History: Last updated: 05/22/2009 Married Does not smoke Alcohol use-no Drug use-no Regular exercise-yes  Review of Systems       The patient complains of fever.  The patient denies anorexia, weight loss, weight gain, decreased hearing, hoarseness, chest pain, dyspnea on exertion, headaches, hemoptysis, abdominal pain, hematuria, muscle weakness, transient blindness, and depression.    Physical Exam  General:  Well-developed,well-nourished,in no acute distress; alert,appropriate and cooperative  throughout examination Head:  normocephalic and atraumatic.   Eyes:  corneas and lenses clear and no injection.   Neck:  full ROM and no carotid bruits.   Chest Wall:  no deformities.   Lungs:  normal respiratory effort and normal breath sounds. Course rhonchi Heart:  normal rate and regular rhythm.   Neurologic:  alert & oriented X3 and cranial nerves II-XII intact.   Skin:  turgor normal and color normal.   Psych:  Oriented X3 and memory intact for recent and remote.     Impression & Recommendations:  Problem # 1:  BRONCHITIS-ACUTE (ICD-466.0) Patient with early acute bronchitis.  Plan - doxycycline 100mg  two times a day x 7           supportive care  His updated medication list for this problem includes:    Doxycycline Hyclate 100 Mg Caps (Doxycycline hyclate) .[name]... 1 by mouth two times a day x 7 for bronchitis  Complete Medication List: 1)  Amiodarone Hcl 200 Mg Tabs (Amiodarone hcl) .[name].. 1 by mouth once daily 2)  Multivitamins Tabs (Multiple vitamin) .[name].. 1 by mouth once daily 3)  Vitamin C 1000 Mg Tabs (Ascorbic acid) .[name].. 1 by mouth once daily 4)  Lipitor 80 Mg Tabs (Atorvastatin calcium) .... 1/2 tab once daily 5)  Adult Aspirin Low Strength 81 Mg Tbdp (Aspirin) .[name].. 1 by mouth once daily 6)  Benicar 40 Mg Tabs (Olmesartan medoxomil) .[name].. 1 by mouth once daily 7)  Protonix 40 Mg Tbec (Pantoprazole sodium) .[name].. 1  by mouth as needed 8)  Claritin 10 Mg Tabs (Loratadine) .[name].. 1 by mouth as needed 9)  Nasacort Aq 55 Mcg/act Aers (Triamcinolone acetonide(nasal)) .... As needed 10)  Coumadin 5 Mg Tabs (Warfarin sodium) .... Take as directed by coumadin clinic. 11)  Amlodipine Besylate 5 Mg Tabs (Amlodipine besylate) .... Take one tablet by mouth daily 12)  Doxycycline Hyclate 100 Mg Caps (Doxycycline hyclate) .[name].. 1 by mouth two times a day x 7 for bronchitis  Patient Instructions: 1)  Bronchitis - mild to moderate case of bronchitis. Plan - take dixycycline two times a day  for 7 days. For cough take robitussin DM or the equivalent. Tylenol for fever and aches.  Prescriptions: DOXYCYCLINE HYCLATE 100 MG CAPS (DOXYCYCLINE HYCLATE) 1 by mouth two times a day x 7 for bronchitis  #14 x 0   Entered and Authorized by:   Jacques Navy MD   Signed by:   Jacques Navy MD on 12/10/2009   Method used:   Electronically to        [facility] (retail)       [address]       [place], [place]  [address]       Ph: [phone]       Fax: [phone]   RxID:  [id number]    Preventive Care Screening  Last Flu Shot:    Date:  09/26/2009    Results:  given

## 2011-01-04 NOTE — Medication Information (Signed)
Summary: Jesus [name]  Anticoagulant Therapy  Managed by: Cloyde Reams, RN, BSN Referring MD: Charlies Constable MD PCP: Link Snuffer MD: Juanda Chance MD, Keziah Drotar Indication 1: Atrial Fibrillation (ICD-427.31) Lab Used: [facility] Site: [facility] INR POC 2.2 INR RANGE 2 - 3  Dietary changes: no    Health status changes: no    Bleeding/hemorrhagic complications: no    Recent/future hospitalizations: no    Any changes in medication regimen? yes       Details: New BP med started.  Recent/future dental: no  Any missed doses?: no       Is patient compliant with meds? yes       Allergies (verified): 1)  ! Iodine 2)  ! Codeine  Anticoagulation Management History:      The patient is taking warfarin and comes in today for a routine follow up visit.  Positive risk factors for bleeding include an age of 72 years or older.  Negative risk factors for bleeding include no history of CVA/TIA.  The bleeding index is 'intermediate risk'.  Positive CHADS2 values include History of HTN.  Negative CHADS2 values include Age > 87 years old and Prior Stroke/CVA/TIA.  The start date was 10/14/2005.  Anticoagulation responsible provider: Juanda Chance MD, Smitty Cords.  INR POC: 2.2.  Cuvette Lot#: [id number].  Exp: 01/2011.    Anticoagulation Management Assessment/Plan:      The patient's current anticoagulation dose is Coumadin 5 mg tabs: Take as directed by coumadin clinic..  The target INR is 2 - 3.  The next INR is due 01/13/2010.  Anticoagulation instructions were given to patient.  Results were reviewed/authorized by Cloyde Reams, RN, BSN.  He was notified by Cloyde Reams RN.         Prior Anticoagulation Instructions: INR 2.1  Continue on same dosage 1/2 tablet daily except 1 tablet on Mondays.   Recheck in 4 weeks.    Current Anticoagulation Instructions: INR 2.2  Continue on same dosage 1/2 tablet daily except 1 tablet on Mondays.   Recheck in 4 weeks.

## 2011-01-04 NOTE — Medication Information (Signed)
Summary: rov/jm   Anticoagulant Therapy  Managed by: Reina Fuse, PharmD Referring MD: Charlies Constable MD PCP: Jacques Navy MD Supervising MD: Clifton James MD, Cristal Deer Indication 1: Atrial Fibrillation (ICD-427.31) Lab Used: [facility] Site: [facility] INR POC 2.1 INR RANGE 2 - 3  Dietary changes: no    Health status changes: no    Bleeding/hemorrhagic complications: yes       Details: Recent nosebleeds  ~ about 1 per week. Lasts about 1-5 minutes.   Recent/future hospitalizations: no    Any changes in medication regimen? yes       Details: PCP added a urinary medication, but pt is unsure of medication name.   Recent/future dental: no  Any missed doses?: no       Is patient compliant with meds? yes       Allergies: 1)  ! Iodine 2)  ! Codeine  Anticoagulation Management History:      The patient is taking warfarin and comes in today for a routine follow up visit.  Positive risk factors for bleeding include an age of 70 years or older.  Negative risk factors for bleeding include no history of CVA/TIA.  The bleeding index is 'intermediate risk'.  Positive CHADS2 values include History of HTN.  Negative CHADS2 values include Age > 72 years old and Prior Stroke/CVA/TIA.  The start date was 10/14/2005.  His last INR was 1.4 ratio.  Anticoagulation responsible provider: Clifton James MD, Cristal Deer.  INR POC: 2.1.  Cuvette Lot#: [id number].  Exp: 09/2011.    Anticoagulation Management Assessment/Plan:      The patient's current anticoagulation dose is Coumadin 5 mg tabs: Take as directed by coumadin clinic..  The target INR is 2 - 3.  The next INR is due 08/17/2010.  Anticoagulation instructions were given to patient.  Results were reviewed/authorized by Reina Fuse, PharmD.  He was notified by Reina Fuse PharmD.         Prior Anticoagulation Instructions: INR 1.6 Today take an extra 2.5mg s  then change dose to 2.5mg s everyday except 5mg s on Mondays and Fridays. Recheck in 11 days.     Current Anticoagulation Instructions: INR 2.1  Continue taking Coumadin 0.5 tab (2.5 mg) on all days except Coumadin 1 tab (5 mg) on Mondays and Fridays. Return to clinic in 3 weeks.

## 2011-01-04 NOTE — Progress Notes (Signed)
Summary: REFERRAL?   Phone Note Call from Patient Call back at [facility] Phone [phone]   Summary of Call: Pt continues to c/o nocturia - 3 to 4 times every night. Pt wants to know if he needs referral to urologist?  Initial call taken by: Lamar Sprinkles, CMA,  July 21, 2010 1:54 PM  Follow-up for Phone Call        reviewed last office note: patient reported stopping caffeine had fixed the problem.  For recurrent nocturia - trial of tamsulosin 0.4 mg at bedtime #30, refill x 3 Follow-up by: Jacques Navy MD,  July 21, 2010 4:34 PM  Additional Follow-up for Phone Call Additional follow up Details #1::        left mess to call office back...............Marland KitchenLamar Sprinkles, CMA  July 21, 2010 5:33 PM   Spoke w/pt, he would like to try med, rx sent in Additional Follow-up by: Lamar Sprinkles, CMA,  July 22, 2010 9:50 AM    New/Updated Medications: FLOMAX 0.4 MG CAPS (TAMSULOSIN HCL) 1 at bedtime Prescriptions: FLOMAX 0.4 MG CAPS (TAMSULOSIN HCL) 1 at bedtime  #30 x 3   Entered by:   Lamar Sprinkles, CMA   Authorized by:   Jacques Navy MD   Signed by:   Lamar Sprinkles, CMA on 07/22/2010   Method used:   Electronically to        [facility] (retail)       [address]       [place], [place]  [address]       Ph: [phone]       Fax: [phone]   RxID:   [id number]

## 2011-01-04 NOTE — Assessment & Plan Note (Signed)
Summary: 3-5 DYS FU--DR MEN PT/NOT IN CLINIC--[facility]   Vital Signs:  Patient profile:   70 year old male Height:      62 inches Weight:      135 pounds BMI:     24.78 O2 Sat:      97 % on Room air Temp:     97.8 degrees F oral Pulse rate:   61 / minute Pulse rhythm:   regular Resp:     16 per minute BP sitting:   100 / 60  (left arm) Cuff size:   regular  Vitals Entered By: Bill Salinas CMA (February 01, 2010 1:08 PM)  O2 Flow:  Room air CC: pt here for follow up/ ab   Primary Care Provider:  Jacques Navy MD  CC:  pt here for follow up/ ab.  History of Present Illness: He returns for f/up and feels much better with no fever for 48 hours and no more abd. pain. In the ER his U/S showed gallstones and cholecystitis. He was seen by a Surgery PA and instructed to get a referral from me for an outpt. appt. this week.  Preventive Screening-Counseling & Management  Alcohol-Tobacco     Alcohol drinks/day: 0     Smoking Status: never  Clinical Review Panels:  Lipid Management   Cholesterol:  142 (10/13/2009)   LDL (bad choesterol):  80 (10/13/2009)   HDL (good cholesterol):  44.90 (10/13/2009)   Triglycerides:  67 (11/20/2006)  Diabetes Management   Creatinine:  0.8 (01/29/2010)   Last Flu Vaccine:  given (09/26/2009)   Last Pneumovax:  Pneumovax (01/14/2010)  CBC   WBC:  9.6 (01/29/2010)   RBC:  3.90 (01/29/2010)   Hgb:  13.1 (01/29/2010)   Hct:  39.6 (01/29/2010)   Platelets:  114.0 (01/29/2010)   MCV  101.6 (01/29/2010)   MCHC  33.0 (01/29/2010)   RDW  13.4 (01/29/2010)   PMN:  91.5 H % (01/29/2010)   Lymphs:  4.8 L % (01/29/2010)   Monos:  2.8 (01/29/2010)   Eosinophils:  0.1 (01/29/2010)   Basophil:  0.8 (01/29/2010)  Complete Metabolic Panel   Glucose:  110 (01/29/2010)   Sodium:  138 (01/29/2010)   Potassium:  3.8 (01/29/2010)   Chloride:  105 (01/29/2010)   CO2:  28 (01/29/2010)   BUN:  10 (01/29/2010)   Creatinine:  0.8 (01/29/2010)   Albumin:   3.8 (01/29/2010)   Total Protein:  7.2 (01/29/2010)   Calcium:  8.9 (01/29/2010)   Total Bili:  1.4 (01/29/2010)   Alk Phos:  162 (01/29/2010)   SGPT (ALT):  213 (01/29/2010)   SGOT (AST):  255 (01/29/2010)   Medications Prior to Update: 1)  Amiodarone Hcl 200 Mg  Tabs (Amiodarone Hcl) .[name].. 1 By Mouth Once Daily 2)  Multivitamins   Tabs (Multiple Vitamin) .[name].. 1 By Mouth Once Daily 3)  Vitamin C 1000 Mg  Tabs (Ascorbic Acid) .[name].. 1 By Mouth Once Daily 4)  Lipitor 80 Mg Tabs (Atorvastatin Calcium) .... 1/2 Tab Once Daily 5)  Adult Aspirin Low Strength 81 Mg  Tbdp (Aspirin) .[name].. 1 By Mouth Once Daily 6)  Benicar 40 Mg  Tabs (Olmesartan Medoxomil) .[name].. 1 By Mouth Once Daily 7)  Protonix 40 Mg  Tbec (Pantoprazole Sodium) .[name].. 1  By Mouth As Needed 8)  Claritin 10 Mg  Tabs (Loratadine) .[name].. 1 By Mouth As Needed 9)  Nasacort Aq 55 Mcg/act  Aers (Triamcinolone Acetonide(Nasal)) .... As Needed 10)  Coumadin 5 Mg  Tabs (Warfarin Sodium) .... Take As Directed By Coumadin Clinic. 11)  Amlodipine Besylate 5 Mg Tabs (Amlodipine Besylate) .... Take One Tablet By Mouth Daily 12)  Oxybutynin Chloride 5 Mg Tabs (Oxybutynin Chloride) .[name].. 1 By Mouth Bid 13)  Cipro 500 Mg Tab (Ciprofloxacin Hcl) .... Take 1 Tablet By Mouth Morning and Night X 10 Days 14)  Metronidazole 250 Mg Tabs (Metronidazole) .... One By Mouth Qid For 10 Days  Current Medications (verified): 1)  Amiodarone Hcl 200 Mg  Tabs (Amiodarone Hcl) .[name].. 1 By Mouth Once Daily 2)  Multivitamins   Tabs (Multiple Vitamin) .[name].. 1 By Mouth Once Daily 3)  Vitamin C 1000 Mg  Tabs (Ascorbic Acid) .[name].. 1 By Mouth Once Daily 4)  Lipitor 80 Mg Tabs (Atorvastatin Calcium) .... 1/2 Tab Once Daily 5)  Adult Aspirin Low Strength 81 Mg  Tbdp (Aspirin) .[name].. 1 By Mouth Once Daily 6)  Benicar 40 Mg  Tabs (Olmesartan Medoxomil) .[name].. 1 By Mouth Once Daily 7)  Protonix 40 Mg  Tbec (Pantoprazole Sodium) .[name].. 1  By Mouth As Needed 8)  Claritin 10 Mg  Tabs  (Loratadine) .[name].. 1 By Mouth As Needed 9)  Nasacort Aq 55 Mcg/act  Aers (Triamcinolone Acetonide(Nasal)) .... As Needed 10)  Coumadin 5 Mg Tabs (Warfarin Sodium) .... Take As Directed By Coumadin Clinic. 11)  Amlodipine Besylate 5 Mg Tabs (Amlodipine Besylate) .... Take One Tablet By Mouth Daily 12)  Oxybutynin Chloride 5 Mg Tabs (Oxybutynin Chloride) .[name].. 1 By Mouth Bid 13)  Cipro 500 Mg Tab (Ciprofloxacin Hcl) .... Take 1 Tablet By Mouth Morning and Night X 10 Days  Allergies (verified): 1)  ! Iodine 2)  ! Codeine  Past History:  Past Medical History: Reviewed history from 10/16/2008 and no changes required.  GASTROESOPHAGEAL REFLUX DISEASE (ICD-530.81) FIBRILLATION, ATRIAL (ICD-427.31) AORTIC STENOSIS, MILD (ICD-424.1) CAD (ICD-414.00) HYPERTENSION (ICD-401.9 1. Coronary artery disease, status post coronary bypass graft surgery,     1997. 2. Atrial fibrillation, now holding sinus rhythm on Amiodarone. 3. 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 4. Mild aortic stenosis with a mean aortic valve gradient of 12 mmHg. 5. Hypertension. 6. Hyperlipidemia. 7. Facial lesions which may represent actinic keratoses and possible     photosensitivity from Amiodarone.   Past Surgical History: Reviewed history from 04/20/2009 and no changes required. Tonsillectomy Coronary artery bypass graft Nov 18 2006 PROCEDURE:  DC cardioversion.   The patient has been anticoagulated with therapeutic INRs on Coumadin for  greater than 3 weeks.  He has been on rate control medication.  He is  brought in today for cardioversion.   Procedure was performed with AP paddles with 120 watt seconds.  He was given  250 mg of pentothal by Dr. Jacklynn Bue.  He converted on the first shock to  sinus rhythm.  There were no immediate complications.  Family History: Reviewed history from 06/17/2009 and no changes required. Mother died of CVA at age  24. Father died of MI at age 34. He has one brother who has a history of atrial fibrillation.  Social History: Reviewed history from 05/22/2009 and no changes required. Married Does not smoke Alcohol use-no Drug use-no Regular exercise-yes  Review of Systems  The patient denies anorexia, fever, weight loss, chest pain, peripheral edema, prolonged cough, abdominal pain, and hematuria.   GI:  Denies abdominal pain, bloody stools, change in bowel habits, indigestion, loss of appetite, nausea, vomiting, vomiting blood,  and yellowish skin color. Heme:  Complains of abnormal bruising; denies bleeding, enlarge lymph nodes, fevers, pallor, and skin discoloration.  Physical Exam  General:  alert, well-developed, well-nourished, well-hydrated, appropriate dress, normal appearance, healthy-appearing, cooperative to examination, and good hygiene.   Head:  normocephalic, atraumatic, no abnormalities observed, and no abnormalities palpated.   Eyes:  pink  moist mm., no icterus or injection. Mouth:  good dentition, no gingival abnormalities, no dental plaque, pharynx pink and moist, no erythema, no exudates, no posterior lymphoid hypertrophy, and no postnasal drip.   Neck:  supple, full ROM, no masses, no thyromegaly, normal carotid upstroke, no carotid bruits, no cervical lymphadenopathy, and no neck tenderness.   Lungs:  normal respiratory effort, no intercostal retractions, no accessory muscle use, normal breath sounds, no dullness, no fremitus, no crackles, and no wheezes.   Heart:  normal rate and regular rhythm.  grade 2 /6 systolic murmur heard everywhere. Abdomen:  soft, non-tender, normal bowel sounds, no distention, no masses, no guarding, no rigidity, no rebound tenderness, no abdominal hernia, no inguinal hernia, no hepatomegaly, and no splenomegaly.   Msk:  no joint tenderness and no joint swelling.   Pulses:  R and L carotid,radial,femoral,dorsalis pedis and posterior tibial pulses are  full and equal bilaterally Extremities:  No clubbing, cyanosis, edema, or deformity noted with normal full range of motion of all joints.   Neurologic:  No cranial nerve deficits noted. Station and gait are normal. Plantar reflexes are down-going bilaterally. DTRs are symmetrical throughout. Sensory, motor and coordinative functions appear intact. Skin:  turgor normal, color normal, no rashes, no suspicious lesions, no ulcerations, no edema, and ecchymosis and senile purpura on forearms. Cervical Nodes:  no anterior cervical adenopathy and no posterior cervical adenopathy.   Axillary Nodes:  no R axillary adenopathy and no L axillary adenopathy.   Psych:  Oriented X3, memory intact for recent and remote, and good eye contact.     Impression & Recommendations:  Problem # 1:  FIBRILLATION, ATRIAL (ICD-427.31) Assessment Unchanged INR is only 1.4 but with Cipro on board and him having potential surgery this week I am not going to change Coumadin dose. His updated medication list for this problem includes:    Amiodarone Hcl 200 Mg Tabs (Amiodarone hcl) .[name]... 1 by mouth once daily    Adult Aspirin Low Strength 81 Mg Tbdp (Aspirin) .[name]... 1 by mouth once daily    Coumadin 5 Mg Tabs (Warfarin sodium) .[name]... Take as directed by coumadin clinic.    Amlodipine Besylate 5 Mg Tabs (Amlodipine besylate) .[name]... 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 1 by mouth once daily 2)  Multivitamins Tabs (Multiple vitamin) .[name].. 1 by mouth once daily 3)  Vitamin C 1000 Mg Tabs (Ascorbic acid) .[name].. 1 by mouth once daily 4)  Lipitor 80 Mg Tabs (Atorvastatin calcium) .... 1/2 tab once daily 5)  Adult Aspirin Low  Strength 81 Mg Tbdp (Aspirin) .[name].. 1 by mouth once daily 6)  Benicar 40 Mg Tabs (Olmesartan medoxomil) .[name].. 1 by mouth once daily 7)  Protonix 40 Mg Tbec (Pantoprazole sodium) .[name].. 1  by mouth as needed 8)  Claritin 10 Mg Tabs (Loratadine) .[name].. 1 by mouth as needed 9)  Nasacort Aq  55 Mcg/act Aers (Triamcinolone acetonide(nasal)) .... As needed 10)  Coumadin 5 Mg Tabs (Warfarin sodium) .... Take as directed by coumadin clinic. 11)  Amlodipine Besylate 5 Mg Tabs (Amlodipine besylate) .... Take one tablet by mouth daily 12)  Oxybutynin Chloride 5 Mg Tabs (Oxybutynin chloride) .[name].. 1 by mouth bid 13)  Cipro 500 Mg Tab (Ciprofloxacin hcl) .... Take 1 tablet by mouth morning and night x 10 days  Patient Instructions: 1)  Please schedule a follow-up appointment in 2 weeks. 2)  Check your Blood Pressure regularly. If it is above 130/80: you should make an appointment. 3)  Take your antibiotic as prescribed until ALL of it is gone, but stop if you develop a rash or swelling and contact our office as soon as possible.

## 2011-01-04 NOTE — Letter (Signed)
Summary: Clearance Letter  [facility], Main Office  [address]   [place], [place] [address]   Phone: [phone]  Fax: [phone]    February 09, 2010  Re:     Jesus [name] Address:   [address]     [place], [place]  [address] DOB:     May 29, 1941 MRN:     [id number]   Dear Dr. Zachery Dakins,  Mr. Barge is at acceptable cardiovascular risk for a laproscopic cholecystectomy under general anesthesia and may hold his coumadin for 5 days prior to surgery.    Sincerely,   Charlies Constable, MD Sherri Rad, RN, BSN  Appended Document: Clearance Letter Letter faxed to [facility] @ [phone].

## 2011-01-04 NOTE — Assessment & Plan Note (Signed)
Summary: pneumonia shot/men/cd   Nurse Visit   Allergies: 1)  ! Iodine 2)  ! Codeine  Immunizations Administered:  Pneumonia Vaccine:    Vaccine Type: Pneumovax    Site: left deltoid    Mfr: Merck    Dose: 0.5 ml    Route: IM    Given by: Ami Bullins CMA    Exp. Date: 01/01/2011    Lot #: [id number]    VIS given: 07/02/96 version given January 14, 2010.  Orders Added: 1)  Pneumococcal Vaccine [90732] 2)  Admin 1st Vaccine [91478]

## 2011-01-04 NOTE — Assessment & Plan Note (Signed)
Summary: f49m  Medications Added AMIODARONE HCL 200 MG  TABS (AMIODARONE HCL) take 1/2 tablet by mouth once daily      Allergies Added:   Visit Type:  Initial Consult Primary Provider:  Jacques Navy MD  CC:  none.  History of Present Illness: The patient is 70 years old and return for management of CAD. 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 Home over a year ago he developed atrial fibrillation which was treated with amiodarone and Coumadin. He has had no recurrence since that time. His ejection fraction fell but was better on his last echocardiogram in November of 2010 when it was 40-45%. He also has moderate aortic stenosis with a mean aortic valve gradient of 22 mm.  He has been doing well since his last visit with no chest pain shortness of breath or palpitations. His eye doctor told told him he had some changes from amiodarone on his eye exam.  Current Medications (verified): 1)  Amiodarone Hcl 200 Mg  Tabs (Amiodarone Hcl) .[name].. 1 By Mouth Once Daily 2)  Multivitamins   Tabs (Multiple Vitamin) .[name].. 1 By Mouth Once Daily 3)  Vitamin C 1000 Mg  Tabs (Ascorbic Acid) .[name].. 1 By Mouth Once Daily 4)  Lipitor 80 Mg Tabs (Atorvastatin Calcium) .... 1/2 Tab Once Daily 5)  Adult Aspirin Low Strength 81 Mg  Tbdp (Aspirin) .[name].. 1 By Mouth Once Daily 6)  Benicar 40 Mg  Tabs (Olmesartan Medoxomil) .[name].. 1 By Mouth Once Daily 7)  Protonix 40 Mg  Tbec (Pantoprazole Sodium) .[name].. 1  By Mouth As Needed 8)  Claritin 10 Mg  Tabs (Loratadine) .[name].. 1 By Mouth As Needed 9)  Nasacort Aq 55 Mcg/act  Aers (Triamcinolone Acetonide(Nasal)) .... As Needed 10)  Coumadin 5 Mg Tabs (Warfarin Sodium) .... Take As Directed By Coumadin Clinic. 11)  Amlodipine Besylate 5 Mg Tabs (Amlodipine Besylate) .... Take One Tablet By Mouth Daily  Allergies (verified): 1)  ! Iodine 2)  ! Codeine  Past History:  Past Medical History: Reviewed history from 10/16/2008 and no changes required.  GASTROESOPHAGEAL  REFLUX DISEASE (ICD-530.81) FIBRILLATION, ATRIAL (ICD-427.31) AORTIC STENOSIS, MILD (ICD-424.1) CAD (ICD-414.00) HYPERTENSION (ICD-401.9 1. Coronary artery disease, status post coronary bypass graft surgery,     1997. 2. Atrial fibrillation, now holding sinus rhythm on Amiodarone. 3. 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 4. Mild aortic stenosis with a mean aortic valve gradient of 12 mmHg. 5. Hypertension. 6. Hyperlipidemia. 7. Facial lesions which may represent actinic keratoses and possible     photosensitivity from Amiodarone.   Review of Systems       ROS is negative except as outlined in HPI.   Vital Signs:  Patient profile:   70 year old male Height:      62 inches Weight:      134 pounds Pulse rate:   53 / minute BP sitting:   114 / 65  (left arm) Cuff size:   regular  Vitals Entered By: Burnett Kanaris, CNA (July 06, 2010 2:33 PM)  Physical Exam  Additional Exam:  Gen. Well-nourished, in no distress   Neck: No JVD, thyroid not enlarged, no carotid bruits Lungs: No tachypnea, clear without rales, rhonchi or wheezes Cardiovascular: Rhythm regular, PMI not displaced,  heart sounds  normal, geade 2-3/6 systolic murmur at the left sternal edge, no peripheral edema, pulses normal in all 4 extremities. Abdomen:  BS normal, abdomen soft and non-tender without masses or organomegaly, no hepatosplenomegaly. MS: No deformities, no cyanosis or clubbing   Neuro:  No focal sns   Skin:  no lesions    Impression & Recommendations:  Problem # 1:  CORONARY ATHEROSCLEROSIS NATIVE CORONARY ARTERY (ICD-414.01) He had previous bypass surgery in 1993. He has had no recent symptoms. We will plan a stress test in February with Dr. Excell Seltzer. His updated medication list for this problem includes:    Adult Aspirin Low Strength 81 Mg Tbdp (Aspirin) .[name]... 1 by mouth once daily    Coumadin 5 Mg Tabs (Warfarin sodium) .[name]... Take  as directed by coumadin clinic.    Amlodipine Besylate 5 Mg Tabs (Amlodipine besylate) .[name]... Take one tablet by mouth daily  Problem # 2:  FIBRILLATION, ATRIAL (ICD-427.31) He has had paroxysmal atrial fibrillation but no recurrence on amiodarone. I think we can cut his amiodarone dose down to 100 mg. Otherwise he is stable. His updated medication list for this problem includes:    Amiodarone Hcl 200 Mg Tabs (Amiodarone hcl) .[name]... Take 1/2 tablet by mouth once daily    Adult Aspirin Low Strength 81 Mg Tbdp (Aspirin) .[name]... 1 by mouth once daily    Coumadin 5 Mg Tabs (Warfarin sodium) .[name]... Take as directed by coumadin clinic.  Orders: EKG w/ Interpretation (93000) Echocardiogram (Echo) Treadmill (Treadmill)  Problem # 3:  AORTIC STENOSIS, MILD (ICD-424.1) He has moderate aortic stenosis with a mean aortic valve gradient of 22 mm. He has mild to moderate left ventricular dysfunction with an ejection fraction of 40-45% we will plan a followup echocardiogram prior to his next visit in 6 months to evaluate his LV function and aortic stenosis. His updated medication list for this problem includes:    Benicar 40 Mg Tabs (Olmesartan medoxomil) .[name]... 1 by mouth once daily  Patient Instructions: 1)  Decrease amiodarone to 200mg  1/2 tablet once daily. 2)  Your physician wants you to follow-up in: 6 months with Dr. Excell Seltzer.  You will receive a reminder letter in the mail two months in advance. If you don't receive a letter, please call our office to schedule the follow-up appointment. 3)  Your physician has requested that you have an exercise tolerance test in 6 months- the same day you see Dr. Excell Seltzer.  For further information please visit [email or web address]  Please also follow instruction sheet, as given. 4)  Your physician has requested that you have an echocardiogram in 6 months- just prior to your appointment with Dr. Excell Seltzer.  Echocardiography is a painless test that uses sound waves to create  images of your heart. It provides your doctor with information about the size and shape of your heart and how well your heart's chambers and valves are working.  This procedure takes approximately one hour. There are no restrictions for this procedure. 5)  Your physician recommends that you return for lab work in: 6 months- the same day as your echo- lipid/liver/cbc/bmet/tsh (414.01;428.22;427.31)

## 2011-01-04 NOTE — Medication Information (Signed)
Summary: resumed coumadin 02/22/10 after surgery 02/19/10/ewj      Allergies Added:  Anticoagulant Therapy  Managed by: Rolland Porter, PharmD Referring MD: Charlies Constable MD PCP: Jacques Navy MD Supervising MD: Clifton James MD, Cristal Deer Indication 1: Atrial Fibrillation (ICD-427.31) Lab Used: [facility] Site: [facility] INR POC 1.8 INR RANGE 2 - 3  Dietary changes: no    Health status changes: no    Bleeding/hemorrhagic complications: no    Recent/future hospitalizations: yes       Details: gall bladder surgery 3/21, restarted coumadin 3/21  Any changes in medication regimen? no    Recent/future dental: no  Any missed doses?: no       Is patient compliant with meds? yes       Current Medications (verified): 1)  Amiodarone Hcl 200 Mg  Tabs (Amiodarone Hcl) .[name].. 1 By Mouth Once Daily 2)  Multivitamins   Tabs (Multiple Vitamin) .[name].. 1 By Mouth Once Daily 3)  Vitamin C 1000 Mg  Tabs (Ascorbic Acid) .[name].. 1 By Mouth Once Daily 4)  Lipitor 80 Mg Tabs (Atorvastatin Calcium) .... 1/2 Tab Once Daily 5)  Adult Aspirin Low Strength 81 Mg  Tbdp (Aspirin) .[name].. 1 By Mouth Once Daily 6)  Benicar 40 Mg  Tabs (Olmesartan Medoxomil) .[name].. 1 By Mouth Once Daily 7)  Protonix 40 Mg  Tbec (Pantoprazole Sodium) .[name].. 1  By Mouth As Needed 8)  Claritin 10 Mg  Tabs (Loratadine) .[name].. 1 By Mouth As Needed 9)  Nasacort Aq 55 Mcg/act  Aers (Triamcinolone Acetonide(Nasal)) .... As Needed 10)  Coumadin 5 Mg Tabs (Warfarin Sodium) .... Take As Directed By Coumadin Clinic. 11)  Amlodipine Besylate 5 Mg Tabs (Amlodipine Besylate) .... Take One Tablet By Mouth Daily 12)  Oxybutynin Chloride 5 Mg Tabs (Oxybutynin Chloride) .[name].. 1 By Mouth Bid  Allergies (verified): 1)  ! Iodine 2)  ! Codeine  Anticoagulation Management History:      Positive risk factors for bleeding include an age of 2 years or older.  Negative risk factors for bleeding include no history of CVA/TIA.  The bleeding index is  'intermediate risk'.  Positive CHADS2 values include History of HTN.  Negative CHADS2 values include Age > 10 years old and Prior Stroke/CVA/TIA.  The start date was 10/14/2005.  His last INR was 1.4 ratio.  Anticoagulation responsible provider: Clifton James MD, Cristal Deer.  INR POC: 1.8.  Exp: 04/2011.    Anticoagulation Management Assessment/Plan:      The patient's current anticoagulation dose is Coumadin 5 mg tabs: Take as directed by coumadin clinic..  The target INR is 2 - 3.  The next INR is due 03/16/2010.  Anticoagulation instructions were given to patient.  Results were reviewed/authorized by Rolland Porter, PharmD.  He was notified by Rolland Porter.         Prior Anticoagulation Instructions: INR 2.2  Continue 0.5 tab daily except 1 tab each Monday.  Take last coumadin (warfarin) dose on 3/15 to prepare for 3/21 surgery.  Resume warfarin asap after surgery.  Recheck INR 4 - 5 days after surgery.  Please call at [phone] to schedule this appointment.     Current Anticoagulation Instructions: INR 1.8  Continue to take one tablet on Monday's and 1/2 tablet all other days of the week.    Recheck in 2 weeks.

## 2011-01-04 NOTE — Progress Notes (Signed)
Summary: dose pt need ultrasound   Phone Note Call from Patient Call back at Work Phone [phone]   Caller: Patient Reason for Call: Talk to Nurse, Talk to Doctor Summary of Call: pt wants to know if he needs to had a ultrasound on his heart not sure which one he gets. Initial call taken by: Omer Jack,  June 24, 2010 8:53 AM  Follow-up for Phone Call        I reviewed this with Dr. Juanda Chance, no echo needed until 11/11. I have made the pt. aware.  Follow-up by: Sherri Rad, RN, BSN,  June 24, 2010 5:42 PM

## 2011-01-04 NOTE — Medication Information (Signed)
Summary: Jesus [name]  Anticoagulant Therapy  Managed by: Elaina Pattee, PharmD Referring MD: Charlies Constable MD PCP: Jacques Navy MD Supervising MD: Excell Seltzer MD, Casimiro Needle Indication 1: Atrial Fibrillation (ICD-427.31) Lab Used: [facility] Site: [facility] INR POC 2.1 INR RANGE 2 - 3  Dietary changes: no    Health status changes: no    Bleeding/hemorrhagic complications: no    Recent/future hospitalizations: no    Any changes in medication regimen? no    Recent/future dental: no  Any missed doses?: no       Is patient compliant with meds? yes       Allergies: 1)  ! Iodine 2)  ! Codeine  Anticoagulation Management History:      The patient is taking warfarin and comes in today for a routine follow up visit.  Positive risk factors for bleeding include an age of 72 years or older.  Negative risk factors for bleeding include no history of CVA/TIA.  The bleeding index is 'intermediate risk'.  Positive CHADS2 values include History of HTN.  Negative CHADS2 values include Age > 86 years old and Prior Stroke/CVA/TIA.  The start date was 10/14/2005.  His last INR was 1.4 ratio.  Anticoagulation responsible provider: Excell Seltzer MD, Casimiro Needle.  INR POC: 2.1.  Cuvette Lot#: [id number].  Exp: 04/2011.    Anticoagulation Management Assessment/Plan:      The patient's current anticoagulation dose is Coumadin 5 mg tabs: Take as directed by coumadin clinic..  The target INR is 2 - 3.  The next INR is due 04/13/2010.  Anticoagulation instructions were given to patient.  Results were reviewed/authorized by Elaina Pattee, PharmD.  He was notified by Elaina Pattee, PharmD.         Prior Anticoagulation Instructions: INR 1.8  Continue to take one tablet on Monday's and 1/2 tablet all other days of the week.    Recheck in 2 weeks.  Current Anticoagulation Instructions: INR 2.1. Take 0.5 tablet daily except 1 tablet on Mondays.

## 2011-01-04 NOTE — Progress Notes (Signed)
  Phone Note Call from Patient   Summary of Call: Pt c/o dry mouth, nose bleeds and still gets up at night to urinate. Patient is requesting alternative medication.  Initial call taken by: Lamar Sprinkles, CMA,  June 18, 2010 12:11 PM  Follow-up for Phone Call        1. hold nasacort for 1 week in regard to nosebleeds 2. Nocturia - not on any meds for this - needs ov. Follow-up by: Jacques Navy MD,  June 18, 2010 1:26 PM  Additional Follow-up for Phone Call Additional follow up Details #1::        Pt informed, scheduled for office visit monday Additional Follow-up by: Lamar Sprinkles, CMA,  June 18, 2010 6:31 PM

## 2011-01-04 NOTE — Medication Information (Signed)
Summary: rov/ln  Anticoagulant Therapy  Managed by: Weston Brass, PharmD Referring MD: Charlies Constable MD PCP: Jacques Navy MD Supervising MD: Juanda Chance MD, Rabon Scholle Indication 1: Atrial Fibrillation (ICD-427.31) Lab Used: [facility] Site: [facility] INR POC 2.0 INR RANGE 2 - 3  Dietary changes: no    Health status changes: no    Bleeding/hemorrhagic complications: no    Recent/future hospitalizations: no    Any changes in medication regimen? no    Recent/future dental: no  Any missed doses?: no       Is patient compliant with meds? yes       Allergies: 1)  ! Iodine 2)  ! Codeine  Anticoagulation Management History:      Positive risk factors for bleeding include an age of 70 years or older.  Negative risk factors for bleeding include no history of CVA/TIA.  The bleeding index is 'intermediate risk'.  Positive CHADS2 values include History of HTN.  Negative CHADS2 values include Age > 52 years old and Prior Stroke/CVA/TIA.  The start date was 10/14/2005.  His last INR was 1.4 ratio.  Anticoagulation responsible provider: Juanda Chance MD, Smitty Cords.  INR POC: 2.0.  Exp: 09/2011.    Anticoagulation Management Assessment/Plan:      The patient's current anticoagulation dose is Coumadin 5 mg tabs: Take as directed by coumadin clinic..  The target INR is 2 - 3.  The next INR is due 07/27/2010.  Anticoagulation instructions were given to patient.  Results were reviewed/authorized by Weston Brass, PharmD.  He was notified by Liana Gerold, PharmD Candidate.         Prior Anticoagulation Instructions: INR 1.5  Take an extra 1/2 today and then take 1 tab tomorrow.  Then resume normal regimen of 1/2 tab daily except for 1 tab on Monday.  Re-check in 1 week.   Current Anticoagulation Instructions: INR 2.0  No changes in dosage.  Continue 1/2 tablet daily except for 1 tablet Mondays.  Recheck in 3 weeks.

## 2011-01-04 NOTE — Progress Notes (Signed)
Summary: OTC suggestion  Phone Note Call from Patient Call back at Work Phone [phone] Call back at [phone]   Summary of Call: Pt had some blood in his stool and took otc stool softner/laxative. He had one other episode of blood. Pt says stool softner helped and has not seen any further blood. He wants to know if he should be using otc fiber or other product on a daily basis? Or should he take as needed stool softner?  Initial call taken by: Lamar Sprinkles, CMA,  August 10, 2010 9:34 AM  Follow-up for Phone Call        using fiber to ensure a good,easy, strain free bowel movement is a good idea. Follow-up by: Jacques Navy MD,  August 10, 2010 10:17 AM  Additional Follow-up for Phone Call Additional follow up Details #1::        Pt informed  Additional Follow-up by: Lamar Sprinkles, CMA,  August 11, 2010 9:31 AM

## 2011-01-04 NOTE — Letter (Signed)
Summary: [facility]  [facility]   Imported By: Esmeralda Links D'jimraou 09/27/2010 10:44:46  _____________________________________________________________________  External Attachment:    Type:   Image     Comment:   External Document

## 2011-01-04 NOTE — Letter (Signed)
Summary: Patient Notice- Polyp Results  [facility]  [address] [place], [place] [address]   Phone: [phone]  Fax: [phone]        November 04, 2010 MRN: [id number]    Jesus [name] [address] [place], [place]  [address]    Dear [name],  I am pleased to inform you that the colon polyp(s) removed during your recent colonoscopy was (were) found to be benign (no cancer detected) upon pathologic examination.The polyp was adenomatous ( precancerous)  I recommend you have a repeat colonoscopy examination in 5_ years to look for recurrent polyps, as having colon polyps increases your risk for having recurrent polyps or even colon cancer in the future.  Should you develop new or worsening symptoms of abdominal pain, bowel habit changes or bleeding from the rectum or bowels, please schedule an evaluation with either your primary care physician or with me.  Additional information/recommendations:  _x_ No further action with gastroenterology is needed at this time. Please      follow-up with your primary care physician for your other healthcare      needs.  __ Please call [phone] to schedule a return visit to review your      situation.  __ Please keep your follow-up visit as already scheduled.  __ Continue treatment plan as outlined the day of your exam.  Please call us if you are having persistent problems or have questions about your condition that have not been fully answered at this time.  Sincerely,  Hart Carwin MD  This letter has been electronically signed by your physician.  Appended Document: Patient Notice- Polyp Results Letter mailed

## 2011-01-04 NOTE — Progress Notes (Signed)
  Phone Note Refill Request Message from:  Fax from Pharmacy on January 13, 2010 11:55 AM  Refills Requested: Medication #1:  LIPITOR 80 MG TABS 1/2 tab once daily Initial call taken by: Ami Bullins CMA,  January 13, 2010 11:55 AM    Prescriptions: LIPITOR 80 MG TABS (ATORVASTATIN CALCIUM) 1/2 tab once daily  #30 x 12   Entered by:   Ami Bullins CMA   Authorized by:   Jacques Navy MD   Signed by:   Bill Salinas CMA on 01/13/2010   Method used:   Electronically to        [facility] (retail)       [address]       [place], [place]  [address]       Ph: [phone]       Fax: [phone]   RxID:   [id number]

## 2011-01-04 NOTE — Assessment & Plan Note (Signed)
Summary: SORE R HAND-D/T-STC   Vital Signs:  Patient profile:   70 year old male Height:      62 inches Weight:      134.25 pounds BMI:     24.64 O2 Sat:      97 % on Room air Temp:     97.3 degrees F oral Pulse rate:   62 / minute Pulse rhythm:   regular BP sitting:   108 / 64  (left arm) Cuff size:   large  Vitals Entered By: Rock Nephew CMA (March 30, 2010 2:07 PM)  O2 Flow:  Room air CC: R side arm and hip pain x 2wks, discuss dry mouth from medication Is Patient Diabetic? No Pain Assessment Patient in pain? yes     Location: R arm and hip Onset of pain  With activity   Primary Care Provider:  Jacques Navy MD  CC:  R side arm and hip pain x 2wks and discuss dry mouth from medication.  History of Present Illness: Patient presents for evaluation of a sore right forearm. He recalls that he was working around the house and used his right arm to sling a full garbage bag. He immediately had pain in the right forearm. He had no limitation in function although due to the pain he cut back on his activities. He had no paresthesia or focal weakness. He reports that he is now feeling much better.  Current Medications (verified): 1)  Amiodarone Hcl 200 Mg  Tabs (Amiodarone Hcl) .[name].. 1 By Mouth Once Daily 2)  Multivitamins   Tabs (Multiple Vitamin) .[name].. 1 By Mouth Once Daily 3)  Vitamin C 1000 Mg  Tabs (Ascorbic Acid) .[name].. 1 By Mouth Once Daily 4)  Lipitor 80 Mg Tabs (Atorvastatin Calcium) .... 1/2 Tab Once Daily 5)  Adult Aspirin Low Strength 81 Mg  Tbdp (Aspirin) .[name].. 1 By Mouth Once Daily 6)  Benicar 40 Mg  Tabs (Olmesartan Medoxomil) .[name].. 1 By Mouth Once Daily 7)  Protonix 40 Mg  Tbec (Pantoprazole Sodium) .[name].. 1  By Mouth As Needed 8)  Claritin 10 Mg  Tabs (Loratadine) .[name].. 1 By Mouth As Needed 9)  Nasacort Aq 55 Mcg/act  Aers (Triamcinolone Acetonide(Nasal)) .... As Needed 10)  Coumadin 5 Mg Tabs (Warfarin Sodium) .... Take As Directed By Coumadin Clinic. 11)   Amlodipine Besylate 5 Mg Tabs (Amlodipine Besylate) .... Take One Tablet By Mouth Daily 12)  Oxybutynin Chloride 5 Mg Tabs (Oxybutynin Chloride) .[name].. 1 By Mouth Bid  Allergies (verified): 1)  ! Iodine 2)  ! Codeine  Past History:  Past Medical History: Last updated: 10/16/2008  GASTROESOPHAGEAL REFLUX DISEASE (ICD-530.81) FIBRILLATION, ATRIAL (ICD-427.31) AORTIC STENOSIS, MILD (ICD-424.1) CAD (ICD-414.00) HYPERTENSION (ICD-401.9 1. Coronary artery disease, status post coronary bypass graft surgery,     1997. 2. Atrial fibrillation, now holding sinus rhythm on Amiodarone. 3. 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 4. Mild aortic stenosis with a mean aortic valve gradient of 12 mmHg. 5. Hypertension. 6. Hyperlipidemia. 7. Facial lesions which may represent actinic keratoses and possible     photosensitivity from Amiodarone.   Past Surgical History: Last updated: 04/20/2009 Tonsillectomy Coronary artery bypass graft Nov 18 2006 PROCEDURE:  DC cardioversion.   The patient has been anticoagulated with therapeutic INRs on Coumadin for  greater than 3 weeks.  He has been on rate control medication.  He is  brought in today for cardioversion.  Procedure was performed with AP paddles with 120 watt seconds.  He was given  250 mg of pentothal by Dr. Jacklynn Bue.  He converted on the first shock to  sinus rhythm.  There were no immediate complications. PSH reviewed for relevance, FH reviewed for relevance  Review of Systems MS:  Complains of muscle aches and stiffness; denies joint pain, joint redness, joint swelling, loss of strength, and cramps.  Physical Exam  General:  WNWD white male in no distress Head:  normocephalic and atraumatic.   Lungs:  normal respiratory effort and normal breath sounds.   Heart:  normal rate and regular rhythm.   Msk:  grip strength is normal in the right hand. No pain with pronation or  supination right hand against resistance. No tenderness to palpation over the lateral or medial epicondyle. Normal sensation in the hand. Mild tenderness to deep palpation of the right forearm.   Impression & Recommendations:  Problem # 1:  ARM PAIN, RIGHT (ICD-729.5) Exam is unremarkable with no radicular findings, normal neuro-vascular exam. Suspect mild muscle strain right forearm now resolved.  Complete Medication List: 1)  Amiodarone Hcl 200 Mg Tabs (Amiodarone hcl) .[name].. 1 by mouth once daily 2)  Multivitamins Tabs (Multiple vitamin) .[name].. 1 by mouth once daily 3)  Vitamin C 1000 Mg Tabs (Ascorbic acid) .[name].. 1 by mouth once daily 4)  Lipitor 80 Mg Tabs (Atorvastatin calcium) .... 1/2 tab once daily 5)  Adult Aspirin Low Strength 81 Mg Tbdp (Aspirin) .[name].. 1 by mouth once daily 6)  Benicar 40 Mg Tabs (Olmesartan medoxomil) .[name].. 1 by mouth once daily 7)  Protonix 40 Mg Tbec (Pantoprazole sodium) .[name].. 1  by mouth as needed 8)  Claritin 10 Mg Tabs (Loratadine) .[name].. 1 by mouth as needed 9)  Nasacort Aq 55 Mcg/act Aers (Triamcinolone acetonide(nasal)) .... As needed 10)  Coumadin 5 Mg Tabs (Warfarin sodium) .... Take as directed by coumadin clinic. 11)  Amlodipine Besylate 5 Mg Tabs (Amlodipine besylate) .... Take one tablet by mouth daily 12)  Oxybutynin Chloride 5 Mg Tabs (Oxybutynin chloride) .[name].. 1 by mouth bid

## 2011-01-04 NOTE — Medication Information (Signed)
Summary: rov/eac  Anticoagulant Therapy  Managed by: Bethena Midget, RN, BSN Referring MD: Charlies Constable MD PCP: Jacques Navy MD Supervising MD: Daleen Squibb MD, Maisie Fus Indication 1: Atrial Fibrillation (ICD-427.31) Lab Used: [facility] Site: [facility] INR POC 1.6 INR RANGE 2 - 3  Dietary changes: yes       Details: Ate extra green leafy veggies  Health status changes: no    Bleeding/hemorrhagic complications: no    Recent/future hospitalizations: no    Any changes in medication regimen? no    Recent/future dental: no  Any missed doses?: yes     Details: missed Sat and Sun. dose. due to bruises on arms. States he was doing work around [employer].   Is patient compliant with meds? yes      Comments: Educated on self dosing.   Allergies: 1)  ! Iodine 2)  ! Codeine  Anticoagulation Management History:      The patient is taking warfarin and comes in today for a routine follow up visit.  Positive risk factors for bleeding include an age of 72 years or older.  Negative risk factors for bleeding include no history of CVA/TIA.  The bleeding index is 'intermediate risk'.  Positive CHADS2 values include History of HTN.  Negative CHADS2 values include Age > 92 years old and Prior Stroke/CVA/TIA.  The start date was 10/14/2005.  His last INR was 1.4 ratio.  Anticoagulation responsible provider: Daleen Squibb MD, Maisie Fus.  INR POC: 1.6.  Cuvette Lot#: [id number].  Exp: 07/2011.    Anticoagulation Management Assessment/Plan:      The patient's current anticoagulation dose is Coumadin 5 mg tabs: Take as directed by coumadin clinic..  The target INR is 2 - 3.  The next INR is due 06/01/2010.  Anticoagulation instructions were given to patient.  Results were reviewed/authorized by Bethena Midget, RN, BSN.  He was notified by Bethena Midget, RN, BSN.         Prior Anticoagulation Instructions: INR 2.2  Continue taking 1 tablet on Monday and 1/2 tablet all other days.  Return to clinic in 4 weeks.     Current Anticoagulation Instructions: INR 1.6 Today take extra 1/2 pill then resume 1/2 pill everyday except 1 pill on Monday. Recheck in 3 weeks.

## 2011-01-04 NOTE — Progress Notes (Signed)
Summary: SAMPLES  Phone Note Call from Patient   Summary of Call: Patient is requesting samples of lipitor or benicar.  Initial call taken by: Lamar Sprinkles, CMA,  November 23, 2009 2:11 PM  Follow-up for Phone Call        informed pt's wife that pt could pick up samples Follow-up by: Ami Bullins CMA,  November 23, 2009 4:23 PM

## 2011-01-04 NOTE — Progress Notes (Signed)
  Phone Note Refill Request Message from:  Fax from Pharmacy on March 05, 2010 9:36 AM  Refills Requested: Medication #1:  OXYBUTYNIN CHLORIDE 5 MG TABS 1 by mouth bid.   Last Refilled: 10/09/2009 Please Advise refill,fax from [facility]  Initial call taken by: Ami Bullins CMA,  March 05, 2010 9:36 AM  Follow-up for Phone Call        ok to refill as needed  Follow-up by: Jacques Navy MD,  March 05, 2010 1:08 PM    Prescriptions: OXYBUTYNIN CHLORIDE 5 MG TABS (OXYBUTYNIN CHLORIDE) 1 by mouth bid  #60 x 1   Entered by:   Ami Bullins CMA   Authorized by:   Jacques Navy MD   Signed by:   Bill Salinas CMA on 03/05/2010   Method used:   Electronically to        [facility]* (retail)       [address]       [place], [place]  [address]       Ph: [phone]       Fax: [phone]   RxID:   [id number]

## 2011-01-04 NOTE — Progress Notes (Signed)
Summary: Medication ?   Phone Note Call from Patient Call back at [phone]   Caller: Patient Call For: Dr. Juanda Chance Reason for Call: Talk to Nurse Details for Reason: Medication ? Summary of Call: Pt. called and wanted to talk to you about Gatorade and his colonoscopy. Initial call taken by: Schuyler Amor,  November 01, 2010 8:43 AM  Follow-up for Phone Call        questions answered. Follow-up by: Lamona Curl CMA Duncan Dull),  November 01, 2010 8:54 AM     Appended Document: Colonoscopy     Procedures Next Due Date:    Colonoscopy: 11/2015

## 2011-01-04 NOTE — Consult Note (Signed)
[facility]   Imported By: Sherian Rein 09/06/2010 13:30:37  _____________________________________________________________________  External Attachment:    Type:   Image     Comment:   External Document

## 2011-01-04 NOTE — Letter (Signed)
Summary: [facility]   Imported By: Roderic Ovens 06/02/2010 10:09:56  _____________________________________________________________________  External Attachment:    Type:   Image     Comment:   External Document

## 2011-01-04 NOTE — Progress Notes (Signed)
Summary: Dry Cough  Phone Note Call from Patient Call back at Work Phone [phone]   Summary of Call: Patient left message on triage that he still has a dry cough, non mucous producing, and would like call back. I spoke with the patient and confirmed that he did take all of the Doxycycline. Patient would like to know if we should send prescription for more Doxycycline or an alternative or if he should come in for office visit. Please advise. Initial call taken by: Lucious Groves,  December 30, 2009 10:25 AM  Follow-up for Phone Call        In the absence of fever, purulent mucus or shortness of breath another round of antibiotics is not indicated. For cough OK to use robitussin DM or equivalent. If he is feeling ill I am alwasys happy to see him.  Follow-up by: Jacques Navy MD,  December 30, 2009 1:28 PM  Additional Follow-up for Phone Call Additional follow up Details #1::        I am aware that pt would be at work until 12, left message on machine to call back to office. Additional Follow-up by: Lucious Groves,  December 30, 2009 3:19 PM    Additional Follow-up for Phone Call Additional follow up Details #2::    Patient notified and denies feeling ill. Follow-up by: Lucious Groves,  December 30, 2009 4:41 PM

## 2011-01-04 NOTE — Progress Notes (Signed)
Summary: samples/dry mouth  Phone Note Call from Patient Call back at Work Phone [phone]   Summary of Call: Patient left message on triage stating that he recently has gallstones removed and is having frequent urination. Patient states that he has to get up during the night to use the restroom. Patient also c/o increased dry mouth, having to get up during the night for water due to med for frequent urination. Please advise.  Also needs samples of Benicar, Lipitor, and a med for frequent urination. Initial call taken by: Lucious Groves,  March 03, 2010 10:28 AM  Follow-up for Phone Call        1) med list show oxybutynin - this can cause dry mouth. He can try reducing to once in AM to reduce dry mouth 2) increased urinary frequency and thirst -reviewed recent labs with normal blood sugar. Can recheck - metabolic, A1C for 788.43 3) ok for samples of benicar and lipitor if we have his dose. Follow-up by: Jacques Navy MD,  March 03, 2010 2:30 PM  Additional Follow-up for Phone Call Additional follow up Details #1::        Patient notified and will declined labs due to having last week at the hospital and he will pick up Benicar samples on Mon. Patient is aware we currently do not have his Lipitor. Additional Follow-up by: Lucious Groves,  March 03, 2010 3:48 PM

## 2011-01-06 NOTE — Progress Notes (Signed)
Summary: SAMPLES  Phone Note Call from Patient Call back at Home Phone [phone]   Summary of Call: Patient is requesting samples.  Initial call taken by: Lamar Sprinkles, CMA,  November 15, 2010 9:32 AM  Follow-up for Phone Call        Pt informed  Follow-up by: Lamar Sprinkles, CMA,  November 16, 2010 8:51 AM    Prescriptions: BENICAR 40 MG  TABS (OLMESARTAN MEDOXOMIL) 1 by mouth once daily  #14 x 0   Entered by:   Lamar Sprinkles, CMA   Authorized by:   Jacques Navy MD   Signed by:   Lamar Sprinkles, CMA on 11/16/2010   Method used:   Samples Given   RxID:   [id number]

## 2011-01-06 NOTE — Procedures (Signed)
Summary: Colonoscopy  Patient: [name] Note: All result statuses are Final unless otherwise noted.  Tests: (1) Colonoscopy (COL)   COL Colonoscopy           DONE     [facility]     [address]     [place], [place]  [address]           COLONOSCOPY PROCEDURE REPORT           PATIENT:  Jesus [name], Jesus [name]  MR#:  [id number]     BIRTHDATE:  1941/01/02, 69 yrs. old  GENDER:  male     ENDOSCOPIST:  Hedwig Morton. Juanda Chance, MD     REF. BY:  Rosalyn Gess. Norins, M.D.     PROCEDURE DATE:  11/03/2010     PROCEDURE:  Colonoscopy 19147     ASA CLASS:  Class III     INDICATIONS:  Routine Risk Screening pt on Coumadin,     MEDICATIONS:   Versed 6 mg, Fentanyl 50 mcg           DESCRIPTION OF PROCEDURE:   After the risks benefits and     alternatives of the procedure were thoroughly explained, informed     consent was obtained.  Digital rectal exam was performed and     revealed no rectal masses.   The LB [id number] endoscope     was introduced through the anus and advanced to the cecum, which     was identified by both the appendix and ileocecal valve, without     limitations.  The quality of the prep was good, using MiraLax.     The instrument was then slowly withdrawn as the colon was fully     examined.     <<PROCEDUREIMAGES>>           FINDINGS:  A sessile polyp was found in the rectum. 8 mm soft     polyp in the rectum Polyp was snared without cautery. Retrieval     was successful (see image6). snare polyp  This was otherwise a     normal examination of the colon (see image5, image4, and image3).     Scattered diverticula were found (see image1).   Retroflexed views     in the rectum revealed no abnormalities.    The scope was then     withdrawn from the patient and the procedure completed.           COMPLICATIONS:  None     ENDOSCOPIC IMPRESSION:     1) Sessile polyp in the rectum     2) Otherwise normal examination     3) Diverticula, scattered     RECOMMENDATIONS:     1) Await pathology results     hold Coumadin x 1 week, then restart at the same dose, have PT     checked 3-4 days after restarting the Coumadin     REPEAT EXAM:  In 5 - 10 year(s) for.           ______________________________     Hedwig Morton. Juanda Chance, MD           CC:           n.     eSIGNED:   Hedwig Morton. Brodie at 11/03/2010 10:57 AM           Wayland Denis, [phone]  Note: An exclamation mark (!) indicates a result that was not dispersed into the flowsheet. Document Creation Date: 11/03/2010 10:57  AM _______________________________________________________________________  (1) Order result status: Final Collection or observation date-time: 11/03/2010 10:47 Requested date-time:  Receipt date-time:  Reported date-time:  Referring Physician:   Ordering Physician: Lina Sar [phone]) Specimen Source:  Source: Launa Grill Order Number: [id number] Lab site:   Appended Document: Colonoscopy     Procedures Next Due Date:    Colonoscopy: 11/2015

## 2011-01-06 NOTE — Medication Information (Signed)
Summary: ROV/mwb  Anticoagulant Therapy  Managed by: Cloyde Reams, RN, BSN Referring MD: Charlies Constable MD PCP: Jacques Navy MD Supervising MD: Jens Som MD, Arlys John Indication 1: Atrial Fibrillation (ICD-427.31) Lab Used: [facility] Site: [facility] INR POC 1.6 INR RANGE 2 - 3  Dietary changes: no    Health status changes: no    Bleeding/hemorrhagic complications: no    Recent/future hospitalizations: no    Any changes in medication regimen? yes       Details: Started on Vesicare 6 weeks ago  Recent/future dental: no  Any missed doses?: no       Is patient compliant with meds? yes       Allergies: 1)  ! Iodine 2)  ! Codeine  Anticoagulation Management History:      The patient is taking warfarin and comes in today for a routine follow up visit.  Positive risk factors for bleeding include an age of 72 years or older.  Negative risk factors for bleeding include no history of CVA/TIA.  The bleeding index is 'intermediate risk'.  Positive CHADS2 values include History of HTN.  Negative CHADS2 values include Age > 43 years old and Prior Stroke/CVA/TIA.  The start date was 10/14/2005.  His last INR was 1.5.  Anticoagulation responsible provider: Jens Som MD, Arlys John.  INR POC: 1.6.  Cuvette Lot#: [id number].  Exp: 01/2012.    Anticoagulation Management Assessment/Plan:      The patient's current anticoagulation dose is Coumadin 5 mg tabs: Take as directed by coumadin clinic..  The target INR is 2 - 3.  The next INR is due 12/28/2010.  Anticoagulation instructions were given to patient.  Results were reviewed/authorized by Cloyde Reams, RN, BSN.  He was notified by Cloyde Reams RN.         Prior Anticoagulation Instructions: INR 1.5 Take an extra 1/2 tablet today and take 1 tablet tomorrow instead of 1/2 then continue with current dose after Thusday: 1 tablet on Mon and Fri 1/2 tablet on the rest of the days. Recheck INR in 3 weeks  Current Anticoagulation  Instructions: INR 1.6  Start taking 1/2 tablet daily except 1 tablet on Mondays, Wednesday, and Fridays. Recheck in 3 weeks.

## 2011-01-06 NOTE — Medication Information (Signed)
Summary: rov/ewj   Anticoagulant Therapy  Managed by: Windell Hummingbird, RN Referring MD: Charlies Constable MD PCP: Jacques Navy MD Supervising MD: Graciela Husbands MD, Viviann Spare Indication 1: Atrial Fibrillation (ICD-427.31) Lab Used[facility]  Site: [facility] INR POC 2.2 INR RANGE 2 - 3  Dietary changes: no    Health status changes: no    Bleeding/hemorrhagic complications: no    Recent/future hospitalizations: no    Any changes in medication regimen? no    Recent/future dental: no  Any missed doses?: no       Is patient compliant with meds? yes       Allergies: 1)  ! Iodine 2)  ! Codeine  Anticoagulation Management History:      Positive risk factors for bleeding include an age of 72 years or older.  Negative risk factors for bleeding include no history of CVA/TIA.  The bleeding index is 'intermediate risk'.  Positive CHADS2 values include History of HTN.  Negative CHADS2 values include Age > 36 years old and Prior Stroke/CVA/TIA.  The start date was 10/14/2005.  His last INR was 1.5.  Anticoagulation responsible provider: Graciela Husbands MD, Viviann Spare.  INR POC: 2.2.  Cuvette Lot#: [id number].  Exp: 12/2011.    Anticoagulation Management Assessment/Plan:      The patient's current anticoagulation dose is Coumadin 5 mg tabs: Take as directed by coumadin clinic..  The target INR is 2 - 3.  The next INR is due 01/11/2011.  Anticoagulation instructions were given to patient.  Results were reviewed/authorized by Windell Hummingbird, RN.  He was notified by Windell Hummingbird, RN.         Prior Anticoagulation Instructions: INR 1.6  Start taking 1/2 tablet daily except 1 tablet on Mondays, Wednesday, and Fridays. Recheck in 3 weeks.   Current Anticoagulation Instructions: INR 2.2 Continue with 1/2 tablet every day except 1 tablet every Monday, Wednesday, and Friday. Recheck in 4 weeks. Prescriptions: COUMADIN 5 MG TABS (WARFARIN SODIUM) Take as directed by coumadin clinic.  #90 x 1   Entered by:   Weston Brass  PharmD   Authorized by:   Norva Karvonen, MD   Signed by:   Weston Brass PharmD on 12/28/2010   Method used:   Electronically to        [facility]* (retail)       [address]       [place], [place]  [address]       Ph: [phone]       Fax: [phone]   RxID:   [id number]

## 2011-01-06 NOTE — Medication Information (Signed)
Summary: rov/tm   Anticoagulant Therapy  Managed by: Bethena Midget, RN, BSN Referring MD: Charlies Constable MD PCP: Jacques Navy MD Supervising MD: Gala Romney MD, Reuel Boom Indication 1: Atrial Fibrillation (ICD-427.31) Lab Used: [facility] Site: [facility] INR RANGE 2 - 3  Dietary changes: no    Health status changes: no    Bleeding/hemorrhagic complications: no    Recent/future hospitalizations: no    Any changes in medication regimen? no    Recent/future dental: no  Any missed doses?: no       Is patient compliant with meds? yes      Comments: pt. had colonoscopy and coumadin on hold for 10 days. Restarted on 12/7  Allergies: 1)  ! Iodine 2)  ! Codeine  Anticoagulation Management History:      Positive risk factors for bleeding include an age of 72 years or older.  Negative risk factors for bleeding include no history of CVA/TIA.  The bleeding index is 'intermediate risk'.  Positive CHADS2 values include History of HTN.  Negative CHADS2 values include Age > 14 years old and Prior Stroke/CVA/TIA.  The start date was 10/14/2005.  His last INR was 1.4 ratio and today's INR is 1.5.  Anticoagulation responsible provider: Bensimhon MD, Reuel Boom.  Exp: 10/2010.    Anticoagulation Management Assessment/Plan:      The patient's current anticoagulation dose is Coumadin 5 mg tabs: Take as directed by coumadin clinic..  The target INR is 2 - 3.  The next INR is due 12/07/2010.  Anticoagulation instructions were given to patient.  Results were reviewed/authorized by Bethena Midget, RN, BSN.         Prior Anticoagulation Instructions: INR 1.8 Today take extra 1/2 pill then resume 1/2 pill everyday except 1 pill on Mondays and Fridays. Recheck in 4 weeks.   Current Anticoagulation Instructions: INR 1.5 Take an extra 1/2 tablet today and take 1 tablet tomorrow instead of 1/2 then continue with current dose after Thusday: 1 tablet on Mon and Fri 1/2 tablet on the rest of the  days. Recheck INR in 3 weeks

## 2011-01-06 NOTE — Letter (Signed)
Summary: [facility]   Imported By: Lennie Odor 11/22/2010 11:36:20  _____________________________________________________________________  External Attachment:    Type:   Image     Comment:   External Document

## 2011-01-06 NOTE — Progress Notes (Signed)
Summary: Medication   Phone Note Call from Patient Call back at Home Phone [phone]   Caller: Patient Call For: Dr. Juanda Chance Reason for Call: Refill Medication Summary of Call: Needs a refill on his Protonix Initial call taken by: Karna Christmas,  November 15, 2010 8:49 AM  Follow-up for Phone Call        Spoke with patient's wife (patient was unavailable). Advised her that I dont see where we have ever given patient Protonix. She states maybe patient was getting from Dr Debby Bud. I have advised her that I dont see where he got it from Dr Debby Bud either. She states patient has been having reflux for some time now but that it was exacerbated after his colonoscopy. He is to call me back with details so I can make Dr Juanda Chance aware and can advise patient of her recommendations. Follow-up by: Lamona Curl CMA Duncan Dull),  November 15, 2010 10:01 AM  Additional Follow-up for Phone Call Additional follow up Details #1::        Per Patient, he has been having increasing heartburn and GERD symptoms  for the last several days. He denies any dysphagia, odynophagia, chest pain or abdominal discomfort. He states he has had some extra belching and bloating although not significant. He states that he has a prescription for protonix from Dr Charlies Constable in 2008 which he only takes as needed. I have advised the patient to follow strict antireflux measures (I have given him examples of foods to avoid) and to take a PPI once daily everyday instead of as needed. Dr Juanda Chance, may I go ahead and give him a new protonix prescription (we last saw him 10-25-10 and he never mentioned any problems with GERD)? Additional Follow-up by: Lamona Curl CMA Duncan Dull),  November 15, 2010 2:21 PM    Additional Follow-up for Phone Call Additional follow up Details #2::    I agree with giving him new prescription. Follow-up by: Hart Carwin MD,  November 15, 2010 10:03 PM  Additional Follow-up for Phone Call Additional  follow up Details #3:: Details for Additional Follow-up Action Taken: New prescription sent for Protonix once daily. I have left a message on patient's voicemail to advise him of this. He is to call back with any questions. He is also to call back if his symptoms do not improve over the next couple weeks. Additional Follow-up by: Lamona Curl CMA Duncan Dull),  November 16, 2010 8:32 AM  New/Updated Medications: PROTONIX 40 MG  TBEC (PANTOPRAZOLE SODIUM) Take 1 tablet by mouth once a day Prescriptions: PROTONIX 40 MG  TBEC (PANTOPRAZOLE SODIUM) Take 1 tablet by mouth once a day  #30 x 2   Entered by:   Lamona Curl CMA (AAMA)   Authorized by:   Hart Carwin MD   Signed by:   Lamona Curl CMA (AAMA) on 11/16/2010   Method used:   Electronically to        [facility]* (retail)       [address]       [place], [place]  [address]       Ph: [phone]       Fax: [phone]   RxID:   [id number]

## 2011-01-06 NOTE — Progress Notes (Signed)
  Prescriptions: VESICARE 5 MG TABS (SOLIFENACIN SUCCINATE) Take 1 tablet by mouth once a day  #21 x 0   Entered by:   Ami Bullins CMA   Authorized by:   Jacques Navy MD   Signed by:   Bill Salinas CMA on 12/28/2010   Method used:   Samples Given   RxID:   [id number] NASACORT AQ 55 MCG/ACT  AERS (TRIAMCINOLONE ACETONIDE(NASAL)) as needed  #0 x 0   Entered by:   Ami Bullins CMA   Authorized by:   Jacques Navy MD   Signed by:   Bill Salinas CMA on 12/28/2010   Method used:   Samples Given   RxID:   [id number] BENICAR 40 MG  TABS (OLMESARTAN MEDOXOMIL) 1 by mouth once daily  #28 x 0   Entered by:   Ami Bullins CMA   Authorized by:   Jacques Navy MD   Signed by:   Bill Salinas CMA on 12/28/2010   Method used:   Samples Given   RxID:   [id number]

## 2011-01-10 ENCOUNTER — Encounter: Payer: Self-pay | Admitting: Cardiovascular Disease

## 2011-01-10 ENCOUNTER — Encounter (INDEPENDENT_AMBULATORY_CARE_PROVIDER_SITE_OTHER): Payer: MEDICARE

## 2011-01-10 ENCOUNTER — Encounter: Payer: Self-pay | Admitting: Cardiology

## 2011-01-10 ENCOUNTER — Other Ambulatory Visit (INDEPENDENT_AMBULATORY_CARE_PROVIDER_SITE_OTHER): Payer: MEDICARE

## 2011-01-10 ENCOUNTER — Other Ambulatory Visit: Payer: Self-pay

## 2011-01-10 ENCOUNTER — Ambulatory Visit (HOSPITAL_COMMUNITY): Payer: MEDICARE | Attending: Cardiovascular Disease

## 2011-01-10 ENCOUNTER — Ambulatory Visit (INDEPENDENT_AMBULATORY_CARE_PROVIDER_SITE_OTHER): Payer: MEDICARE | Admitting: Cardiovascular Disease

## 2011-01-10 DIAGNOSIS — I4891 Unspecified atrial fibrillation: Secondary | ICD-10-CM

## 2011-01-10 DIAGNOSIS — E785 Hyperlipidemia, unspecified: Secondary | ICD-10-CM | POA: Insufficient documentation

## 2011-01-10 DIAGNOSIS — Z7901 Long term (current) use of anticoagulants: Secondary | ICD-10-CM

## 2011-01-10 DIAGNOSIS — I080 Rheumatic disorders of both mitral and aortic valves: Secondary | ICD-10-CM | POA: Insufficient documentation

## 2011-01-10 DIAGNOSIS — I2510 Atherosclerotic heart disease of native coronary artery without angina pectoris: Secondary | ICD-10-CM

## 2011-01-10 DIAGNOSIS — I252 Old myocardial infarction: Secondary | ICD-10-CM

## 2011-01-10 DIAGNOSIS — I359 Nonrheumatic aortic valve disorder, unspecified: Secondary | ICD-10-CM

## 2011-01-10 DIAGNOSIS — I079 Rheumatic tricuspid valve disease, unspecified: Secondary | ICD-10-CM | POA: Insufficient documentation

## 2011-01-10 DIAGNOSIS — R0989 Other specified symptoms and signs involving the circulatory and respiratory systems: Secondary | ICD-10-CM

## 2011-01-10 DIAGNOSIS — I10 Essential (primary) hypertension: Secondary | ICD-10-CM | POA: Insufficient documentation

## 2011-01-10 DIAGNOSIS — I5022 Chronic systolic (congestive) heart failure: Secondary | ICD-10-CM

## 2011-01-10 LAB — CBC WITH DIFFERENTIAL/PLATELET
Basophils Absolute: 0 10*3/uL (ref 0.0–0.1)
HCT: 39.7 % (ref 39.0–52.0)
Lymphocytes Relative: 20.2 % (ref 12.0–46.0)
Lymphs Abs: 1.3 10*3/uL (ref 0.7–4.0)
Monocytes Relative: 10.5 % (ref 3.0–12.0)
Platelets: 105 10*3/uL — ABNORMAL LOW (ref 150.0–400.0)
RDW: 14 % (ref 11.5–14.6)

## 2011-01-10 LAB — LIPID PANEL
Cholesterol: 127 mg/dL (ref 0–200)
LDL Cholesterol: 71 mg/dL (ref 0–99)
Triglycerides: 74 mg/dL (ref 0.0–149.0)

## 2011-01-10 LAB — HEPATIC FUNCTION PANEL
ALT: 25 U/L (ref 0–53)
AST: 25 U/L (ref 0–37)
Albumin: 3.8 g/dL (ref 3.5–5.2)
Alkaline Phosphatase: 128 U/L — ABNORMAL HIGH (ref 39–117)
Bilirubin, Direct: 0.1 mg/dL (ref 0.0–0.3)
Total Protein: 6.3 g/dL (ref 6.0–8.3)

## 2011-01-10 LAB — BASIC METABOLIC PANEL
BUN: 15 mg/dL (ref 6–23)
Calcium: 8.6 mg/dL (ref 8.4–10.5)
GFR: 94.19 mL/min (ref >60.00)
Glucose, Bld: 91 mg/dL (ref 70–99)

## 2011-01-10 LAB — TSH: TSH: 2.88 u[IU]/mL (ref 0.35–5.50)

## 2011-01-14 ENCOUNTER — Telehealth: Payer: Self-pay | Admitting: Cardiovascular Disease

## 2011-01-14 DIAGNOSIS — I4891 Unspecified atrial fibrillation: Secondary | ICD-10-CM

## 2011-01-20 NOTE — Medication Information (Signed)
Summary: Coumadin Clinic  Anticoagulant Therapy  Managed by: Bethena Midget, RN, BSN Referring MD: Charlies Constable MD PCP: Jacques Navy MD Supervising MD: Myrtis Ser MD, Tinnie Gens Indication 1: Atrial Fibrillation (ICD-427.31) Lab Used: [facility] Site: [facility] INR POC 2.1 INR RANGE 2 - 3  Dietary changes: no    Health status changes: no    Bleeding/hemorrhagic complications: no    Recent/future hospitalizations: no    Any changes in medication regimen? no    Recent/future dental: no  Any missed doses?: no       Is patient compliant with meds? yes       Allergies: 1)  ! Iodine 2)  ! Codeine  Anticoagulation Management History:      The patient is taking warfarin and comes in today for a routine follow up visit.  Positive risk factors for bleeding include an age of 72 years or older.  Negative risk factors for bleeding include no history of CVA/TIA.  The bleeding index is 'intermediate risk'.  Positive CHADS2 values include History of HTN.  Negative CHADS2 values include Age > 75 years old and Prior Stroke/CVA/TIA.  The start date was 10/14/2005.  His last INR was 1.5.  Anticoagulation responsible provider: Myrtis Ser MD, Tinnie Gens.  INR POC: 2.1.  Cuvette Lot#: [id number].  Exp: 01/2012.    Anticoagulation Management Assessment/Plan:      The patient's current anticoagulation dose is Coumadin 5 mg tabs: Take as directed by coumadin clinic..  The target INR is 2 - 3.  The next INR is due 02/07/2011.  Anticoagulation instructions were given to patient.  Results were reviewed/authorized by Bethena Midget, RN, BSN.  He was notified by Bethena Midget, RN, BSN.         Prior Anticoagulation Instructions: INR 2.2 Continue with 1/2 tablet every day except 1 tablet every Monday, Wednesday, and Friday. Recheck in 4 weeks.  Current Anticoagulation Instructions: INR 2.1 Continue 2.5mg s everyday except 5mg s on Mondays, Wednesdays and Fridays. Recheck in 4 weeks.

## 2011-01-20 NOTE — Progress Notes (Signed)
Summary: Echo results   Phone Note Call from Patient Call back at Home Phone [phone]   Caller: Patient Summary of Call: Pt returning call Initial call taken by: Judie Grieve,  January 14, 2011 1:14 PM  Follow-up for Phone Call        reviewed results with patient and wife Follow-up by: Norva Karvonen, MD,  January 14, 2011 5:27 PM  Additional Follow-up for Phone Call Additional follow up Details #1::        Per Dr Excell Seltzer the pt needs echo and OV in 6 Months.  Additional Follow-up by: Julieta Gutting, RN, BSN,  January 14, 2011 5:29 PM

## 2011-01-20 NOTE — Miscellaneous (Signed)
Summary: Orders Update  Clinical Lists Changes  Orders: Added new Test order of TLB-BMP (Basic Metabolic Panel-BMET) (80048-METABOL) - Signed Added new Test order of TLB-Hepatic/Liver Function Pnl (80076-HEPATIC) - Signed

## 2011-01-28 ENCOUNTER — Telehealth: Payer: Self-pay | Admitting: Internal Medicine

## 2011-02-01 NOTE — Progress Notes (Signed)
  Phone Note Refill Request Message from:  Fax from Pharmacy on January 28, 2011 10:26 AM  Refills Requested: Medication #1:  LIPITOR 80 MG TABS 1/2 tab once daily Initial call taken by: Ami Bullins CMA,  January 28, 2011 10:26 AM    Prescriptions: LIPITOR 80 MG TABS (ATORVASTATIN CALCIUM) 1/2 tab once daily  #30 x 12   Entered by:   Ami Bullins CMA   Authorized by:   Jacques Navy MD   Signed by:   Bill Salinas CMA on 01/28/2011   Method used:   Electronically to        [facility]* (retail)       [address]       [place], [place]  [address]       Ph: [phone]       Fax: [phone]   RxID:   [id number]

## 2011-02-08 ENCOUNTER — Encounter (INDEPENDENT_AMBULATORY_CARE_PROVIDER_SITE_OTHER): Payer: Medicare Other

## 2011-02-08 ENCOUNTER — Encounter: Payer: Self-pay | Admitting: Cardiology

## 2011-02-08 DIAGNOSIS — I4891 Unspecified atrial fibrillation: Secondary | ICD-10-CM

## 2011-02-08 DIAGNOSIS — Z7901 Long term (current) use of anticoagulants: Secondary | ICD-10-CM

## 2011-02-15 NOTE — Medication Information (Signed)
Summary: rov/tm  Anticoagulant Therapy  Managed by: Cloyde Reams, RN, BSN Referring MD: Charlies Constable MD PCP: Jacques Navy MD Supervising MD: Antoine Poche MD, Fayrene Fearing Indication 1: Atrial Fibrillation (ICD-427.31) Lab Used: [facility] Site: [facility] INR POC 2.0 INR RANGE 2 - 3  Dietary changes: no    Health status changes: no    Bleeding/hemorrhagic complications: yes       Details: Occ nosebleeds.  Recent/future hospitalizations: no    Any changes in medication regimen? yes       Details: Different MVI for men.   Recent/future dental: no  Any missed doses?: no       Is patient compliant with meds? yes       Allergies: 1)  ! Iodine 2)  ! Codeine  Anticoagulation Management History:      The patient is taking warfarin and comes in today for a routine follow up visit.  Positive risk factors for bleeding include an age of 72 years or older.  Negative risk factors for bleeding include no history of CVA/TIA.  The bleeding index is 'intermediate risk'.  Positive CHADS2 values include History of HTN.  Negative CHADS2 values include Age > 62 years old and Prior Stroke/CVA/TIA.  The start date was 10/14/2005.  His last INR was 1.5.  Anticoagulation responsible provider: Antoine Poche MD, Fayrene Fearing.  INR POC: 2.0.  Cuvette Lot#: [id number].  Exp: 12/2011.    Anticoagulation Management Assessment/Plan:      The patient's current anticoagulation dose is Coumadin 5 mg tabs: Take as directed by coumadin clinic..  The target INR is 2 - 3.  The next INR is due 03/08/2011.  Anticoagulation instructions were given to patient.  Results were reviewed/authorized by Cloyde Reams, RN, BSN.  He was notified by Cloyde Reams RN.         Prior Anticoagulation Instructions: INR 2.1 Continue 2.5mg s everyday except 5mg s on Mondays, Wednesdays and Fridays. Recheck in 4 weeks.   Current Anticoagulation Instructions: INR 2.0  Continue on same dosage 2.5mg  daily except 5mg  on Mondays, Wednesdays, and  Fridays.  Recheck in 4 weeks.

## 2011-02-23 LAB — URINALYSIS, ROUTINE W REFLEX MICROSCOPIC
Bilirubin Urine: NEGATIVE
Ketones, ur: 15 mg/dL — AB
Nitrite: NEGATIVE
Specific Gravity, Urine: 1.018 (ref 1.005–1.030)
Urobilinogen, UA: 0.2 mg/dL (ref 0.0–1.0)
pH: 5.5 (ref 5.0–8.0)

## 2011-02-23 LAB — DIFFERENTIAL
Basophils Relative: 0 % (ref 0–1)
Eosinophils Absolute: 0 10*3/uL (ref 0.0–0.7)
Eosinophils Relative: 0 % (ref 0–5)
Lymphs Abs: 0.4 10*3/uL — ABNORMAL LOW (ref 0.7–4.0)
Monocytes Absolute: 0.8 10*3/uL (ref 0.1–1.0)
Monocytes Relative: 8 % (ref 3–12)
Neutrophils Relative %: 88 % — ABNORMAL HIGH (ref 43–77)

## 2011-02-23 LAB — COMPREHENSIVE METABOLIC PANEL
ALT: 165 U/L — ABNORMAL HIGH (ref 0–53)
AST: 126 U/L — ABNORMAL HIGH (ref 0–37)
Albumin: 3.5 g/dL (ref 3.5–5.2)
Alkaline Phosphatase: 144 U/L — ABNORMAL HIGH (ref 39–117)
Calcium: 8.4 mg/dL (ref 8.4–10.5)
GFR calc Af Amer: >60 mL/min (ref >60)
Glucose, Bld: 117 mg/dL — ABNORMAL HIGH (ref 70–99)
Potassium: 3.4 mEq/L — ABNORMAL LOW (ref 3.5–5.1)
Sodium: 136 mEq/L (ref 135–145)
Total Protein: 6.6 g/dL (ref 6.0–8.3)

## 2011-02-23 LAB — CBC
Hemoglobin: 12.7 g/dL — ABNORMAL LOW (ref 13.0–17.0)
MCHC: 34.9 g/dL (ref 30.0–36.0)
Platelets: 105 10*3/uL — ABNORMAL LOW (ref 150–400)
RDW: 14 % (ref 11.5–15.5)

## 2011-02-23 LAB — PROTIME-INR: Prothrombin Time: 18.4 seconds — ABNORMAL HIGH (ref 11.6–15.2)

## 2011-02-28 LAB — COMPREHENSIVE METABOLIC PANEL
ALT: 30 U/L (ref 0–53)
Alkaline Phosphatase: 130 U/L — ABNORMAL HIGH (ref 39–117)
BUN: 13 mg/dL (ref 6–23)
CO2: 28 mEq/L (ref 19–32)
Calcium: 9.1 mg/dL (ref 8.4–10.5)
GFR calc non Af Amer: >60 mL/min (ref >60)
Glucose, Bld: 87 mg/dL (ref 70–99)
Sodium: 142 mEq/L (ref 135–145)

## 2011-02-28 LAB — CBC
HCT: 38.8 % — ABNORMAL LOW (ref 39.0–52.0)
Hemoglobin: 12.7 g/dL — ABNORMAL LOW (ref 13.0–17.0)
MCHC: 32.7 g/dL (ref 30.0–36.0)
RBC: 3.85 MIL/uL — ABNORMAL LOW (ref 4.22–5.81)
RDW: 14.7 % (ref 11.5–15.5)

## 2011-02-28 LAB — DIFFERENTIAL
Basophils Relative: 1 % (ref 0–1)
Eosinophils Absolute: 0.1 10*3/uL (ref 0.0–0.7)
Lymphs Abs: 1.2 10*3/uL (ref 0.7–4.0)
Neutro Abs: 4.9 10*3/uL (ref 1.7–7.7)
Neutrophils Relative %: 70 % (ref 43–77)

## 2011-02-28 LAB — APTT: aPTT: 34 seconds (ref 24–37)

## 2011-02-28 LAB — PROTIME-INR
INR: 1.89 — ABNORMAL HIGH (ref 0.00–1.49)
Prothrombin Time: 21.5 seconds — ABNORMAL HIGH (ref 11.6–15.2)

## 2011-03-08 ENCOUNTER — Ambulatory Visit (INDEPENDENT_AMBULATORY_CARE_PROVIDER_SITE_OTHER): Payer: Medicare Other | Admitting: *Deleted

## 2011-03-08 DIAGNOSIS — I4891 Unspecified atrial fibrillation: Secondary | ICD-10-CM

## 2011-03-08 DIAGNOSIS — Z7901 Long term (current) use of anticoagulants: Secondary | ICD-10-CM

## 2011-03-08 LAB — POCT INR: INR: 1.8

## 2011-03-08 NOTE — Patient Instructions (Signed)
INR 1.8  Increase dose to 1 tablet every day except 1/2 tablet on Monday, Wednesday and Friday.  Recheck INR in 3 weeks.

## 2011-03-15 ENCOUNTER — Telehealth: Payer: Self-pay | Admitting: *Deleted

## 2011-03-15 NOTE — Telephone Encounter (Signed)
Pt informed that samples are up front for pick up

## 2011-03-15 NOTE — Telephone Encounter (Signed)
Patient requesting samples of any meds he is on, he will be stopping by tomorrow.

## 2011-03-30 ENCOUNTER — Ambulatory Visit (INDEPENDENT_AMBULATORY_CARE_PROVIDER_SITE_OTHER): Payer: Medicare Other | Admitting: *Deleted

## 2011-03-30 DIAGNOSIS — I4891 Unspecified atrial fibrillation: Secondary | ICD-10-CM

## 2011-03-30 LAB — POCT INR: INR: 2.3

## 2011-04-19 NOTE — Assessment & Plan Note (Signed)
[facility]                            CARDIOLOGY OFFICE NOTE   NAME:Longton, Jaelan Elbert Ewings                     MRN:          [id number]  DATE:10/24/2007                            DOB:          1941/07/27    PRIMARY CARE PHYSICIAN:  Mr. Illene Regulus.   CLINICAL HISTORY:  Mr. Shrihaan Porzio returned for a followup 49-month  visit for management of his coronary heart disease and atrial  fibrillation.  He has had previous bypass surgery in 1992, and recently  developed cardiomyopathy with an ejection fraction of 30% associated  with atrial fibrillation.  We were able to get him back in sinus rhythm  with amiodarone, and he has maintained sinus rhythm.  His ejection  fraction improved to 60% by echo, but only about 40% by a Myoview scan.  He says he has been feeling quite well, and has had no recent chest  pain, shortness of breath, or palpitations.   PAST MEDICAL HISTORY:  Significant for:  1. Hypertension.  2. Hyperlipidemia.  3. He has moderate aortic stenosis.   He is also in the Rely Study, and is on Rely study drug.   EXAMINATION:  There was no venous distention.  The carotid pulses were  full without bruits.  CHEST:  Was clear.  HEART:  Rhythm was regular.  I could hear no murmurs or gallops.  ABDOMEN:  Soft without organomegaly.  Peripheral pulses were full with no peripheral edema.   He had a recent Myoview scan, which showed an ejection fraction of 39%,  and an inferolateral scar with minimal pericardial redistribution.  His  recent laboratory studies showed a good cholesterol pattern, and normal  liver function tests.   His blood pressure was 128/74, and a pulse of 58 and regular.   IMPRESSION:  1. Coronary artery disease status post coronary artery bypass graft      surgery in 1992.  2. 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  3. Paroxysmal  atrial fibrillation, now holding sinus rhythm on      amiodarone.  4. Rely study drug.  5. Moderate aortic stenosis with a mean aortic valve gradient of 19 mm      of mercury.  6. Hypertension.  7. Hyperlipidemia.   RECOMMENDATIONS:  I think Mr. Nagengast is doing well.  We will plan to  see him in 6 months.  We will plan to get laboratory screening for  amiodarone at that visit, including a lipid,  liver, CBC, BNP, and TSH.  We will also get pulmonary function tests.  We will also get a repeat echo to reevaluate his aortic stenosis.     Bruce Elvera Lennox Juanda Chance, MD, [facility]  Electronically Signed    BRB/MedQ  DD: 10/24/2007  DT: 10/25/2007  Job #: [id number]

## 2011-04-19 NOTE — Procedures (Signed)
NAME:  Jesus [name], Jesus [name]              ACCOUNT NO.:  [id number]   MEDICAL RECORD NO.:  [id number]          PATIENT TYPE:  OUT   LOCATION:  SLEEP CENTER                 FACILITY:  [facility]   PHYSICIAN:  Coralyn Helling, MD        DATE OF BIRTH:  07-Sep-1941   DATE OF STUDY:  04/30/2007                            NOCTURNAL POLYSOMNOGRAM   REFERRING PHYSICIAN:   REFERRING PHYSICIAN:  Coralyn Helling, MD   INDICATION FOR STUDY:  This individual has a history of hypertension,  coronary artery disease, snoring, sleep disruption, and excessive  daytime sleepiness.  He was referred to the sleep lab for evaluation of  sleep apnea.   EPWORTH SLEEPINESS SCORE:  8.   MEDICATIONS:  Aspirin, amlodipine, vitamin C, Centrum Silver,  amiodarone, Lipitor, Protonix, and Robitussin.  The patient did not take  any medications on the night of the study.   SLEEP ARCHITECTURE:  Total recording time was 375 minutes.  Total sleep  time was 215 minutes.  Sleep efficiency was 57%, which is reduced.  Sleep latency was 12 minutes.  REM latency was 150 minutes.  The patient  was observed in all stages of sleep but had a reduction in the  percentage of slow-wave sleep to 2% of the study and REM sleep to 11% of  the study.  The patient slept exclusively in the non-supine position.   RESPIRATORY DATA:  The average respiratory rate was 16.  The overall  apnea/hypopnea index was 0.8.  The events were exclusively obstructive  in nature.  Mild occasional snoring was noted by the technician.   OXYGEN DATA:  The baseline oxygenation was 97%.  The oxygen saturation  nadir was 90%.  The patient spent a total of 0.2 minutes with an oxygen  saturation between 81-90%.  The remainder of the test time was spent  with an oxygen saturation above 91%.   CARDIAC DATA:  The average heart rate was 68 and the rhythm strips  showed normal sinus rhythm with occasional PVCs.   MOVEMENT-PARASOMNIA:  The periodic limb movement index was 34.  The  patient had one rest room trip.  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   IMPRESSIONS-RECOMMENDATIONS:  This study does not show evidence for  obstructive sleep apnea.  Please note, however, that the patient did not  have any supine sleep and therefore any degree of sleep-disordered  breathing may be underestimated.  Additionally, he had very limited  amount of REM sleep.   There was an increase in his periodic limb movement index and clinical  correlation will be necessary to determine the significance of this.   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      Coralyn Helling, MD  Diplomat, American Board of Sleep Medicine  Electronically Signed     VS/MEDQ  D:  05/02/2007 18:11:27  T:  05/03/2007 04:54:09  Job:  [id number]

## 2011-04-19 NOTE — Assessment & Plan Note (Signed)
[facility]                             PULMONARY OFFICE NOTE   NAME:Jesus [name], Jesus [name]                     MRN:          [id number]  DATE:04/09/2007                            DOB:          February 12, 1941    REFERRING PHYSICIAN:  Bruce R. Juanda Chance, MD, [facility]   I met Jesus [name] today for evaluation of his cough.   He says this has been going on for approximately the last one year.  He  has occasional sputum production which is clear.  He does complain of  postnasal drip with sneezing and has noticed that this has been getting  worse with the change of the seasons.  He says he also notices that his  cough appears to happen more frequently at night.  He denies any chest  tightness or wheezing.  He does not have any symptoms of dyspnea on  exertion.  He denies any symptoms of eczema.  He does have a pet dog but  does not seem to have any problems around his dog.  He was given a  course of Protonix which he said seemed to help to some degree with his  cough, but then once he stopped this he did not notice any worsening of  his cough symptoms.  He has not had any recent fevers, chills, or  sweats.  He has not had any recent sick exposures or travel history.  He  denies any skin rashes or leg swelling.  He has been on amiodarone for  approximately 1-1/2 years.  He also currently is on lisinopril.  His  wife says that she has also heard him snore as well as have episodes  where he stops breathing.  He has also woken up occasionally feeling  like he cannot catch his breath, and he has more difficulty with his  sleep when he is on his back.  He tends to take several naps during the  day because he still feels tired.  He had a chest x-ray in January 2007  which showed mild cardiomegaly.  He had an echocardiogram on Apr 05, 2007  which showed an ejection fraction of 55-60%, which had improved from  previous echocardiogram.   PAST MEDICAL HISTORY:  1.  Hypertension.  2. Coronary artery disease, status post myocardial infarction, status      post coronary artery bypass grafting.  3. Mild aortic stenosis.  4. Atrial fibrillation, converted to sinus rhythm.  5. Gastroesophageal reflux disease.  6. Elevated cholesterol.  7. He has also had a tonsillectomy.   CURRENT MEDICATIONS:  1. Amiodarone 200 mg daily.  2. Centrum Silver once daily.  3. Aspirin 81 mg daily.  4. Protonix 40 mg as needed.  5. Lisinopril 20 mg daily.  6. Vitamin C 1000 mg daily.  7. Lipitor 40 mg daily.  8. RELY study drug b.i.d. for his anticoagulation for his atrial      fibrillation.  9. He also uses Claritin and Nasacort on an as-needed basis, although      it does not appear that he has been using this recently.  He has an allergy to CODEINE.   FAMILY HISTORY:  Significant for his father who died of a myocardial  infarction at the age of 51.  Mother had heart disease and a stroke.  Brother had heart disease, atrial fibrillation, and snoring.   SOCIAL HISTORY:  He is married.  He has no children.  He worked in a  [employer] as a Merchandiser, retail, and now currently works Surveyor, mining.  There is  no history of tobacco or alcohol use.   REVIEW OF SYSTEMS:  Essentially negative, except for as stated above.   PHYSICAL EXAMINATION:  VITAL SIGNS:  Height 5 feet 2 inches, weight 137  pounds, temperature 97.9, blood pressure 140/94, heart rate 59, oxygen  saturation 98% on room air.  HEENT:  Pupils reactive.  There is no sinus tenderness.  Clear nasal  discharge.  He has Mallampati 4 airway, with an enlarged tongue.  NECK:  There is no lymphadenopathy, no thyromegaly.  HEART:  S1 and S2, with a 1/6 systolic murmur.  CHEST:  There was no wheezing or rales.  ABDOMEN:  Soft, nontender.  Positive bowel sounds.  EXTREMITIES:  There was no edema, cyanosis, or clubbing.  NEUROLOGIC:  No focal deficits were appreciated.   IMPRESSION:  1. Chronic cough.   There are several  possible causes for this.  He does have symptoms of  nasal congestion with postnasal drip.  I have given him a sample of  Nasonex and advised him to use this on a regular basis for the next 2-3  weeks, using 2 sprays in each nostril once daily.   He is scheduled to have pulmonary function tests done later this week,  although it seems less likely that his symptoms would be related to  asthma.  He is on however on amiodarone, and if there is underlying lung  disease related to this, that could certainly contribute to his cough  symptoms.  To further assess this, also I would have him undergo a chest  x-ray today.   He does have some symptoms of reflux, but these appear to be  intermittent.  I advised him he could try using his Protonix again on a  regular basis to see if this helps.   Additionally, he is on lisinopril, and while he has been on this  chronically it is still possible that he could develop cough symptoms  related to this.  Therefore, I would recommend that he has this changed  possibly to an ARB, but I would defer this decision to Dr. Juanda [name] and  Dr. Debby Bud.   1. He does also have symptoms of sleep disruption, with excessive      daytime sleepiness and snoring as well as witnessed apneas.  Given      this constellation of symptoms in conjunction with his history of      coronary disease and hypertension, I am concerned that he may have      some degree of sleep disordered breathing.  To further evaluate      this, I will schedule him for an overnight polysomnogram.   I will follow up with him in approximately 3-4 weeks.     Jesus Helling, MD  Electronically Signed    VS/MedQ  DD: 04/09/2007  DT: 04/09/2007  Job #: [id number]   cc:   Jesus Beals. Juanda Chance, MD, [facility]  Jesus Gess. Norins, MD

## 2011-04-19 NOTE — Assessment & Plan Note (Signed)
[facility]                            CARDIOLOGY OFFICE NOTE   NAME:Kurek, Owenn Elbert Ewings                     MRN:          [id number]  DATE:10/16/2008                            DOB:          September 15, 1941    PRIMARY CARE PHYSICIAN:  Duke Salvia. Eliott Nine, MD   CLINICAL HISTORY:   CANCELED DICTATION.     Bruce Elvera Lennox Juanda Chance, MD, [facility]     BRB/MedQ  DD: 10/16/2008  DT: 10/16/2008  Job #: [id number]   cc:   Duke Salvia. Eliott Nine, M.D.  D[facility] Center

## 2011-04-19 NOTE — Assessment & Plan Note (Signed)
 HEALTHCARE                            CARDIOLOGY OFFICE NOTE   NAME:Jesus [name]                     MRN:          [id number]  DATE:04/16/2008                            DOB:          1940/12/16    PRIMARY CARE PHYSICIAN:  Jesus Gess. Norins, MD   CLINICAL HISTORY:  Jesus [name] returned for followup management of  his coronary artery disease and atrial fibrillation.  He had bypass  surgery in 1992 and last year was found to be in atrial fibrillation  with an ejection fraction of 30%.  We were able to get him in back in  sinus rhythm with Amiodarone and he has maintained sinus rhythm since  that time.  Subsequently, his ejection fraction improved to 60% by echo.  He had a Myoview scan which showed no evidence of ischemia.   He says he has been feeling quite well and has had no recent chest pain,  shortness of breath, or palpitations.   PAST MEDICAL HISTORY:  Significant for hypertension, hyperlipidemia, and  he has a mild to moderate aortic stenosis.  He had previously been on  the Rely study drug but now is back on Coumadin after this study was  concluded.   CURRENT MEDICATIONS:  1. Amiodarone 200 mg daily.  2. Vitamins.  3. Lipitor 40 mg daily.  4. Aspirin.  5. Benicar 40 mg daily.  6. Coumadin.   EXAMINATION:  Blood pressure is 136/74 and pulse 69 and regular.  There is no venous distention.  The carotid pulses were full without  bruits.  CHEST:  Clear.  HEART:  Rhythm was regular.  No murmurs or gallops.  ABDOMEN:  Soft without organomegaly.  Peripheral pulses were full and there was no peripheral edema.   IMPRESSION:  1. Coronary artery disease, status post coronary bypass graft surgery,      1992.  2. Atrial fibrillation, now holding sinus rhythm on Amiodarone.  3. 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  4. Mild aortic stenosis with a mean aortic valve  gradient of 12 mmHg.  5. Hypertension.  6. Hyperlipidemia.  7. Facial lesions which may represent actinic keratoses and possible      photosensitivity from Amiodarone.   RECOMMENDATIONS:  I think Jesus [name] is doing extremely well from a  cardiac standpoint.  We will plan to continue his current therapy and  see him back in 6 months at which time we will do a rest stress Myoview  scan.  He had some lesions on his face which appear to be actinic  changes, but possibly could also be related to photosensitivity from  Amiodarone.  He has had some bleeding from these lesions.  We will  arrange a Dermatology consultation for him.     Jesus Elvera [name] Juanda Chance, MD, [facility]  Electronically Signed    BRB/MedQ  DD: 04/16/2008  DT: 04/16/2008  Job #: [id number]   cc:   Jesus Gess. Norins, MD

## 2011-04-19 NOTE — Assessment & Plan Note (Signed)
[place] [facility]                            CARDIOLOGY OFFICE NOTE   NAME:Jesus [name]                     MRN:          [id number]  DATE:10/16/2008                            DOB:          1941-01-17    PRIMARY CARE PHYSICIAN:  Rosalyn Gess. Norins, MD   CLINICAL HISTORY:  Jesus [name] returned for a followup management of  his coronary heart disease.  He is 70 years old and had bypass surgery  in 1997.  He also has atrial fibrillation and developed a rate related  cardiomyopathy with an ejection fraction that felt to 30%.  He underwent  cardioversion and treated with amiodarone and has maintained sinus  rhythm.  His ejection fraction improved to 41% by Myoview scan and 55%  by echo.  He does have a very large inferolateral scar by Myoview.  He  had a recent Myoview scan prior to this visit which showed no evidence  of ischemia and ejection fraction was 41%.  He says he has been doing  quite well and has had no recent chest pain, shortness of breath, or  palpitations.   PAST MEDICAL HISTORY:  Significant for hypertension and hyperlipidemia  and aortic stenosis with aortic valve gradient 12 mmHg.  He had been on  the RELY study which is now finished.   CURRENT MEDICATIONS:  1. Amiodarone 200 mg daily.  2. Lipitor 40 mg daily.  3. Aspirin 81 mg daily.  4. Benicar 40 mg daily.  5. Coumadin.   PHYSICAL EXAMINATION:  VITAL SIGNS:  Today, blood pressure 150/80 and  the pulse 64 and regular.  NECK:  There was no venous distension.  The carotid pulses were full  without bruits.  The carotid pulses were full with transmitted murmur  from below.  CHEST:  Clear without rales or rhonchi.  CARDIAC:  Rhythm was regular.  There was a grade 2-3/6 systolic ejection  murmur at the left sternal edge varying to the base and to the neck.  I  could hear no diastolic murmur.  ABDOMEN:  Soft with normal bowel sounds.  There is no  hepatosplenomegaly.  EXTREMITIES:   Peripheral pulses were full with no peripheral edema.   LABORATORY STUDIES:  Normal liver function tests, normal renal function  and CBC.  The cholesterol was 151 with an HDL of 50 and LDL of 86.   IMPRESSION:  1. Coronary artery disease, status post coronary bypass graft surgery      in 1997, now stable.  2. Ejection fraction 41% by Myoview and 55% by echo.  3. Atrial fibrillation, now holding sinus rhythm on amiodarone.  4. Previous rate related cardiomyopathy, now improved.  5. Aortic stenosis with mean aortic valve gradient of 12 mmHg.  6. Hypertension.  7. Hyperlipidemia.  8. Actinic keratoses.   RECOMMENDATIONS:  I think Jesus [name] is doing well.  We will plan to  continue his current the treatment.  We will have him come back in 6  months and we will get an repeat echo to reevaluate his aortic stenosis  and LV function.  It will be a  year since his last echo.  We will get  laboratory studies for any known surveillance at that time including a  lipid, liver, CBC, TSH and BMP.  I will see him back in followup at that  6 months' times.     Jesus Elvera Lennox Juanda Chance, MD, [facility]  Electronically Signed    BRB/MedQ  DD: 10/16/2008  DT: 10/16/2008  Job #: [id number]   cc:   Rosalyn Gess. Norins, MD

## 2011-04-19 NOTE — Assessment & Plan Note (Signed)
[facility]                            CARDIOLOGY OFFICE NOTE   NAME:Barletta, Naomi Elbert Ewings                     MRN:          [id number]  DATE:01/02/2009                            DOB:          [birth year]    REASON FOR VISIT:  Palpitations.   HISTORY OF PRESENT ILLNESS:  Mr. Stclair is a 70 year old gentleman  with coronary artery disease and paroxysmal atrial fibrillation who  presents today for evaluation of palpitations.  He described an episode  of a palpitations this morning where he felt abnormal heart beat in his  chest.  He denied chest pain or pressure.  He denies dyspnea,  lightheadedness, or near syncope.  He checked his blood pressure and his  heart rate reading was approximately 90.  This was elevated from his  normal baseline range from 60-70.  Blood pressure was somewhat elevated  in the 160s and he called into schedule an appointment.  The patient has  no other complaints at this time.  He is otherwise felt well.  He is  back to his baseline this afternoon.   MEDICATIONS:  1. Amiodarone 200 mg daily.  2. Centrum Silver 1 daily.  3. Vitamin C 1 g daily.  4. Lipitor 40 mg daily.  5. Aspirin 81 mg daily.  6. Benicar 40 mg daily.  7. Coumadin as directed.  8. Flomax 0.4 mg daily.   ALLERGIES:  Contrast media.   PHYSICAL EXAMINATION:  GENERAL:  The patient is alert and oriented.  He  is in no acute distress.  VITAL SIGNS:  Weight is 135 pounds, blood pressure 170/88, heart rate  67, respiratory rate 16.  HEENT:  Normal.  NECK:  Normal carotid upstrokes.  JVP normal.  LUNGS:  Clear bilaterally.  HEART:  Regular rate and rhythm with a grade 2/6 systolic ejection  murmur along the left sternal border radiating to the neck.  There are  no diastolic murmurs or gallops.  ABDOMEN:  Soft, nontender.  No organomegaly.  EXTREMITIES:  There is no clubbing, cyanosis, or edema.  Peripheral  pulses are intact and equal.   EKG shows sinus  rhythm with age indeterminate inferior infarct and  borderline criteria for LVH.  No significant ST or T-wave changes.   ASSESSMENT:  This 70 year old male with symptomatic palpitations.  This  is in the setting of a history of paroxysmal atrial fibrillation and a  coronary artery disease.  I suspect the patient had an episode of atrial  fibrillation this morning, although this was not documented.  He is  maintained on Coumadin and then therefore protected from cardioembolic  events.  I did not make any changes to his medication since this was an  isolated episode.  If he has a recurrent event, change in his medical  therapy can be considered with the addition of other AV nodal blocker in  addition to amiodarone.  If he has recurrent symptoms, he was advised to  contact the office.     Veverly Fells. Excell Seltzer, MD  Electronically Signed    MDC/MedQ  DD: 02/10/2009  DT: 02/11/2009  Job #:  [id number]   cc:   Everardo Beals. Juanda Chance, MD, [facility]

## 2011-04-19 NOTE — Assessment & Plan Note (Signed)
[facility]                            CARDIOLOGY OFFICE NOTE   NAME:Pant, Eean Elbert Ewings                     MRN:          [id number]  DATE:04/24/2007                            DOB:          1941-06-04    PRIMARY CARE PHYSICIAN:  Rosalyn Gess. Norins, MD.   PULMONOLOGIST:  Coralyn Helling, MD.   CLINICAL COURSE:  Jesus [name] is 70 years old and has had remote bypass  surgery. He was admitted with atrial fibrillation and had a Cardiolite  scan which showed no ischemia but showed a reduced ejection fraction in  the 30% range. He was started on amiodarone and converted to sinus  rhythm. His ejection fraction finally improved and on recent echo his  ejection fraction was 55-60%.   He also has aortic stenosis and on his recent echo, the mean gradient  was documented at 19 mmHg.   He has had no recent chest pain, shortness of breath or palpitations.   PAST MEDICAL HISTORY:  Significant for hyperlipidemia. He also has a  history of possible sleep apnea and is being evaluated by Dr. Craige Cotta for  this with a sleep study. He also has a history of hypertension.   CURRENT MEDICATIONS:  Amiodarone, Protonix, RE-LY study drug,  lisinopril, Lipitor and aspirin. Dr. Craige Cotta suggested we consider  switching his lisinopril to an ARB because of cough.   PHYSICAL EXAMINATION:  VITAL SIGNS:  On examination today, the blood  pressure is 140/77, the pulse 64 and regular. His blood pressures have  been in the 160s and 170s frequently at home.  NECK:  There was no vein distention. The carotid pulses were full.  CHEST:  Clear without rales or rhonchi.  HEART:  Rhythm was regular. He had no murmurs or gallops.  ABDOMEN:  Soft with normal bowel sounds. There is no hepatosplenomegaly.  EXTREMITIES:  Peripheral pulses were full and there was no peripheral  edema.   IMPRESSION:  1. Coronary artery disease status post coronary artery bypass graft      surgery in 1992.  2. 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  3. Paroxysmal atrial fibrillation now holding sinus rhythm on      amiodarone.  4. Mild to moderate aortic stenosis with a mean aortic valve gradient      of 19 mmHg.  5. Hyperlipidemia.  6. Hypertension.   RECOMMENDATIONS:  I think Mr. Kushner is doing well. His LV function  has normalized so I think it is pretty clear that he had a rate-related  cardiomyopathy. He recently had labs and pulmonary function tests for  amiodarone surveillance and these were okay. Because of the cost of  lisinopril, we plan to switch him. Since Avapro is not generic and costs  are a big consideration and since his LV function has normalized, we  will switch him to generic amlodipine 10 mg from lisinopril. We will  have him return in 6 mos and will do a rest stress Myoview scan prior to  that visit. He is currently under evaluation by  Dr. Craige Cotta for sleep apnea  and has a sleep study scheduled.     Bruce Elvera Lennox Juanda Chance, MD, [facility]  Electronically Signed    BRB/MedQ  DD: 04/24/2007  DT: 04/25/2007  Job #: [id number]

## 2011-04-19 NOTE — Assessment & Plan Note (Signed)
[facility]                             PULMONARY OFFICE NOTE   NAME:Folse, SOPHEAP BOEHLE                     MRN:          [id number]  DATE:05/22/2007                            DOB:          [birth year]    I saw [name] today in followup for his cough as well his  sleep difficulties.   With regards to his cough he says that his symptoms have essentially  resolved since he was changed from lisinopril to amlodipine,  unfortunately once he started on amlodipine he has since developed  bilateral lower leg swelling.  He says that his sinus symptoms have  improved considerably as well since he started the use of the nasal  irrigation and the Nasonex.  He is now only using the Nasonex on as  needed basis, but continues to use the nasal irrigation.   With regards to his sleep difficulties he had undergone an overnight  polysomnogram on Apr 30, 2007 and this showed an overall apnea-hypopnea  index of 0.8 with oxygen saturation nadir of 90%.  Of note, however, is  that he did not have any supine sleep and he had very minimal amount of  REM sleep, therefore any degree of sleep disorder breathing may be  underestimated.  He did also have an increase in his periodic limb  movement index as well as evidence for bruxism.  With regards to the  periodic limb movement index he does not have any symptoms suggestive of  restless leg syndrome.  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   His medication list was reviewed.   PHYSICAL EXAMINATION:  He is 137 pounds, temperature is 97.7, blood  pressure is 128/76, respirations June 16, 200861, oxygen saturation is  98% on room air.  HEENT:  There is no sinus tenderness, no oral lesions, no  lymphadenopathy.  HEART:  S1-S2.  CHEST:  Clear to auscultation.  ABDOMEN:  Soft, nontender.  EXTREMITIES:  One plus lower extremity edema.   IMPRESSION:  1. Cough.   This appears to have improved after discontinuation of the      angiotensin converting enzyme inhibitor.  He is to have followup      with Dr. Debby Bud and Dr. Juanda Chance with regards to addressing his      antihypertensive medications, specifically with regards to the      amlodipine and his leg swelling.  In addition, I would continue him      on Nasonex on an as needed basis as well as nasal irrigation for      his sinus symptoms as he has seemed to gain symptomatic benefit      from this.  I do not think that any further pulmonary interventions      would be necessary for his cough since he otherwise has had an      unremarkable chest x-ray as well as pulmonary function tests.      Specifically I do not think that his symptoms are related to the  use of his amiodarone, although he would likely need to have      periodic monitoring of his pulmonary function tests while he      remains on amiodarone.  2. 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  In      addition, he may still have some degree of sleep disorder breathing      which may be more positional based on the description by his wife.      I have discussed with him techniques to maintain a non-supine      position while asleep.  I have also advised him to monitor his      symptom status and if he is getting progressively worse with his      symptoms then it may be beneficial to have him undergo a repeat      overnight polysomnogram as it can be night to night variability.      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  3. I  will follow up with him on an as needed basis.     Coralyn Helling, MD  Electronically Signed    VS/MedQ  DD: 05/22/2007  DT: 05/22/2007  Job #: [id number]   cc:   Everardo Beals. Juanda Chance, MD, [facility]  Rosalyn Gess. Norins, MD

## 2011-04-22 NOTE — H&P (Signed)
Jesus [name], Jesus [name]              ACCOUNT NO.:  [id number]   MEDICAL RECORD NO.:  [id number]          PATIENT TYPE:  INP   LOCATION:  2859                         FACILITY:  [facility]   PHYSICIAN:  Charlies Constable, M.D. [facility] DATE OF BIRTH:  Jan 10, 1941   DATE OF ADMISSION:  11/18/2005  DATE OF DISCHARGE:                                HISTORY & PHYSICAL   PRIMARY CARDIOLOGIST:  Dr. Charlies Constable.   PRIMARY CARE PHYSICIAN:  Dr. Illene Regulus.   PATIENT PROFILE:  70 year old white male with prior history of CAD and  recent onset of atrial fibrillation who presented today for cardioversion  which was unfortunately unsuccessful and he is being admitted for amiodarone  loading.   PROBLEM LIST:  1.  Atrial fibrillation.      1.  On October 17, 2005 2D echocardiogram revealed an ejection fraction          of 30-40% with severe inferoposterior wall hypokinesis, mild AS,          mild AI, mild MR. Moderately to markedly dilated left atrium.  2.  Coronary artery disease.      1.  Status post inferior MI in 1980.      2.  Status post CABG in 1997.      3.  November, 2006 Myoview EF 32%, large inferolateral scar consistent          with prior infarct. No ischemia.  3.  Hyperlipidemia.  4.  GERD.   HISTORY OF PRESENT ILLNESS:  70 year old white male with prior history of  CAD was followed by Dr. Juanda Chance. He was seen by Dr. Juanda Chance in clinic when he  complained of fatigue. He was noted to be in atrial fibrillation and  subsequently was initiated on Coumadin with his Toprol dose increased from  25-50 mg daily. He followed up with Dr. Juanda Chance on December 5 at which time  he remained in atrial fibrillation and arrangements were made for  cardioversion which took place earlier this afternoon. Unfortunately, his  cardioversion was not successful and he reverted back to atrial fibrillation  shortly after cardioversion. Currently he is asymptomatic. After some  discussion the patient has agreed to be  admitted for amiodarone loading over  the weekend with plans for possible cardioversion if necessary early next  week.   ALLERGIES:  IODINE.   CURRENT MEDICATIONS:  1.  Aspirin 325 mg daily.  2.  Lipitor 80 mg q.h.s.  3.  Protonix 40 mg daily.  4.  Lisinopril 10 mg daily.  5.  Toprol XL 50 mg daily.  6.  Digoxin 0.125 mg daily.  7.  Multivitamin one daily.   FAMILY HISTORY:  Mother died of CVA at age 80. Father died of MI at age 98.  He has one brother who has a history of atrial fibrillation.   SOCIAL HISTORY:  He lives in [place] with his wife. 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 He has never smoked cigarettes and does not use alcohol or  drugs. He walks on his treadmill  x3 per week for about 35 minutes per  session without any significant limitations.   REVIEW OF SYSTEMS:  Positive for fatigue. All other systems reviewed and  negative.   PHYSICAL EXAMINATION:  VITAL SIGNS:  Temperature is 97.6, heart rate 73,  respirations 16, blood pressure 131/65, pulse oximetry is 100% on 2 liters  of O2.  GENERAL:  Pleasant white male in no acute distress, awake, alert and  oriented x3.  NECK:  Normal carotid upstrokes, no bruits or JVD.  LUNGS:  Respirations were regular and unlabored. Clear to auscultation.  CARDIAC:  Irregularly irregular S1, S2 with a 2/6 systolic murmur at  bilateral upper sternal border.  ABDOMEN:  Round, soft, nontender, nondistended. Bowel sounds present x4.  EXTREMITIES:  Warm, dry, pink. No clubbing, cyanosis or edema. Dorsalis  pedis and posterior tibial pulses 2+ and equal bilaterally.   EKG shows atrial fibrillation at 80 beats per minute.   LABORATORY DATA:  Hemoglobin 14.9, hematocrit 44.8, WBC 8300, platelets  153,000. Sodium 136, potassium 3.6, chloride 101, CO2 27, BUN 10, creatinine  1.0, glucose 175. PT 20.2, INR 2.5, calcium 8.9.   ASSESSMENT AND PLAN:  1.  Atrial  fibrillation. Patient underwent DCCV today which was initially      successful but he has subsequently reverted back to atrial fibrillation.      Will plan to admit him for amiodarone loading over the weekend. Will      check pulmonary function tests, liver function tests and TFTs. Follow      ECG q.a.m. to evaluation QTC. Baseline QTC is 424 which is based only on      a 2 beats as lead I is really the only lead where there are discernable      T waves. Will continue his Coumadin therapy per pharmacy.  2.  Coronary artery disease - stable. Not actively having any chest pain. He      recently had a negative functional study October 06, 2005. Continue beta      blocker, aspirin stat, and an ACE inhibitor.  3.  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 He was euvolemic on      exam. Continue beta blocker, ACE inhibitor and digoxin.  4.  Hyperlipidemia - continue Statin therapy, check lipids.  5.  Gastroesophageal reflux disease - Stable. Continue proton pump      inhibitor.      Ok Anis, NP    ______________________________  Charlies Constable, M.D. [facility]    CRB/MEDQ  D:  11/18/2005  T:  11/20/2005  Job:  [id number]

## 2011-04-22 NOTE — Cardiovascular Report (Signed)
NAME:  IZEAH, VOSSLER              ACCOUNT NO.:  [id number]   MEDICAL RECORD NO.:  [id number]          PATIENT TYPE:  OIB   LOCATION:  2859                         FACILITY:  [facility]   PHYSICIAN:  Charlies Constable, M.D. [facility] DATE OF BIRTH:  December 01, 1941   DATE OF PROCEDURE:  11/18/2005  DATE OF DISCHARGE:                              CARDIAC CATHETERIZATION   PROCEDURE:  DC cardioversion.   The patient has been anticoagulated with therapeutic INRs on Coumadin for  greater than 3 weeks.  He has been on rate control medication.  He is  brought in today for cardioversion.   Procedure was performed with AP paddles with 120 watt seconds.  He was given  250 mg of pentothal by Dr. Jacklynn Bue.  He converted on the first shock to  sinus rhythm.  There were no immediate complications.           ______________________________  Charlies Constable, M.D. [facility]     BB/MEDQ  D:  11/18/2005  T:  11/21/2005  Job:  [id number]

## 2011-04-22 NOTE — H&P (Signed)
Jesus [name], Jesus [name]              ACCOUNT NO.:  [id number]   MEDICAL RECORD NO.:  [id number]          PATIENT TYPE:  INP   LOCATION:  2859                         FACILITY:  [facility]   PHYSICIAN:  Charlies Constable, M.D. [facility] DATE OF BIRTH:  March 17, 1941   DATE OF ADMISSION:  11/18/2005  DATE OF DISCHARGE:                                HISTORY & PHYSICAL   PRIMARY CARE PHYSICIAN:  Rosalyn Gess. Norins, M.D.   CARDIOLOGIST:  Charlies Constable, M.D.   CHIEF COMPLAINT:  Atrial fibrillation.   CLINICAL HISTORY:  Jesus [name] is 70 years old and had a remote  diaphragmatic wall infarction, had bypass surgery in 1991.  He recently had  a Cardiolite scan and was found to be in atrial fibrillation.  At that time,  his ejection fraction was 32%, which was down from about 40%.  There was no  evidence of ischemia.  We started him on Coumadin and we adjusted his Toprol  for rate control and scheduled him to come in for a cardioversion today.  We  cardioverted him with 120 watt seconds and one shock, but shortly after  cardioversion he reverted back to atrial fibrillation.  For this reason, we  made the decision to admit him to the hospital for an Amiodarone load.   PAST MEDICAL HISTORY:  1.  Hyperlipidemia.  2.  GERD.   MEDICATIONS:  1.  Aspirin 325 mg daily.  2.  Lipitor 80 mg daily.  3.  Protonix 40 mg daily.  4.  Lisinopril 10 mg daily.  5.  Toprol XL 50 mg daily.  6.  Digoxin 0.125 mg daily.  7.  Multivitamins.   For details of family history, social history, and review of systems, please  see Jesus [name], N.P., complete note.   PHYSICAL EXAMINATION:  VITAL SIGNS:  Blood pressure 131/65, pulse 73 and  regular.  There was no venous distention.  NECK:  The carotid pulses were full without bruits.  CHEST:  Clear without rales or rhonchi.  CARDIOVASCULAR:  Irregular.  There were no murmurs or gallops.  ABDOMEN:  Soft, without hepatosplenomegaly, bowel sounds were normal.  EXTREMITIES:   Peripheral pulses were full.  There was no peripheral edema.  MUSCULOSKELETAL:  No deformities.  SKIN:  Warm and dry.  NEUROLOGIC:  No focal neurological deficits.   LABORATORY DATA:  Atrial fibrillation with controlled ventricular response.  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   IMPRESSION:  1.  Recurrent atrial fibrillation, admitted for Amiodarone load.  2.  Coronary artery disease, status post coronary artery bypass grafting      surgery in 1991.  3.  Ischemic cardiomyopathy, ejection fraction of 32% by Cardiolite, down      from a previous 40% with no ischemia on Cardiolite scan.  4.  Hyperlipidemia.  5.  Gastroesophageal reflux disease.   RECOMMENDATIONS:  We will plan to admit the patient for Amiodarone load.  We  will then continue the load as an outpatient with plans for repeat  cardioversion in two to three weeks after he has completed a 12 g load.  ______________________________  Charlies Constable, M.D. [facility]     BB/MEDQ  D:  11/18/2005  T:  11/20/2005  Job:  [id number]   cc:   Rosalyn Gess. Norins, M.D. [facility]  [address]  [place]  [place] [address]   Charlies Constable, M.D. [facility]  [address]  [address]  [place]  [place] [address]

## 2011-04-22 NOTE — Discharge Summary (Signed)
NAMEMICHAE, GRIMLEY              ACCOUNT NO.:  [id number]   MEDICAL RECORD NO.:  [id number]           PATIENT TYPE:   LOCATION:  4714                         FACILITY:  [facility]   PHYSICIAN:  Charlies Constable, M.D. [facility] DATE OF BIRTH:  1941-02-16   DATE OF ADMISSION:  11/18/2005  DATE OF DISCHARGE:  11/21/2005                                 DISCHARGE SUMMARY   PRINCIPAL DIAGNOSIS:  Atrial fibrillation.   OTHER DIAGNOSES:  1.  Coronary artery disease, status post inferior myocardial infarction in      1980 with coronary artery bypass grafting in 1987, and a negative      Myoview in November of 2006.  2.  Ischemic cardiomyopathy, ejection fraction 32% by Myoview in November of      2006.  3.  Hyperlipidemia.  4.  Gastroesophageal reflux disease.   ALLERGIES:  IODINE.   PROCEDURES:  DC-CV.   HISTORY OF PRESENT ILLNESS:  This is a 70 year old white male with a prior  history of CAD, who recently saw Dr. Juanda Chance in clinic when he complained of  fatigued.  He was noted to be in atrial fibrillation and subsequently was  initiated on Coumadin therapy with an increase in his Toprol dose from 25 to  50 mg daily.  He followed with Dr. Juanda Chance on December 5th at which time he  remained in atrial fibrillation, and arrangements were made for  cardioversion to take place on November 18, 2005.   HOSPITAL COURSE:  The patient presented actually as an outpatient on  December 15th for cardioversion, which was initially successful; however, he  then reverted back to atrial fibrillation.  At that point, plan was made to  admit Jesus [name] for Amiodarone loading.  Following 3.2 gm of Amiodarone  loading, he converted to sinus rhythm without requiring additional DC-CV.  His INRs remained therapeutic throughout his hospitalization.  With  initiation of Amiodarone, he has experienced some bradycardia with the rates  intermittently down into the 30s and therefore his Toprol and Digoxin were  discontinued.   Since discontinuation, his heart rates have remained in the  60s.  He is being discharged home today in satisfactory condition.   DISCHARGE LABORATORY DATA:  Sodium 138, potassium 4.0, chloride 106, CO2 of  25, BUN 14, creatinine 1.0, glucose 203. PT 28.4, INR 2.7.  Calcium 9.0,  magnesium 2.0.  Total cholesterol 138, triglycerides 113, HDL 38, LDL 77,  TSH 2.108, free T4 1.09.  Total bilirubin 1.3, alkaline phosphatase 86, AST  25, ALT 29, total protein 6.5, albumin 3.7.  Hemoglobin 15.6, hematocrit  44.9, WBC 8.8, platelets 151.   DISPOSITION:  The patient is being discharged home today in good condition.   FOLLOWUP PLANS AND APPOINTMENTS:  He has a followup appointment with Dr.  Regino Schultze nurse practitioner/PA on December 08, 2005, at 1:45 p.m.  Patient  will require outpatient pulmonary function tests to establish a baseline on  Amiodarone therapy.  Could also consider repeat echocardiogram as an  outpatient, now that he is in sinus rhythm, to reevaluate EF, and if it is  still below 35% consider EP  evaluation for ICD.  Patient will follow up in  Coumadin Clinic as previously scheduled.   DISCHARGE MEDICATIONS:  1.  Aspirin 325 mg daily.  2.  Lipitor 80 mg q.h.s.  3.  Protonix 40 mg daily.  4.  Lisinopril 10 mg daily.  5.  Multivitamin 1 daily.  6.  Coumadin as previously prescribed.  7.  Amiodarone 400 mg b.i.d. x1 week, then 400 mg daily with eventual goal      of 200 mg daily.   OUTSTANDING LABORATORY STUDIES:  None.   DURATION OF DISCHARGE ENCOUNTER:  40 minutes.      Ok Anis, NP    ______________________________  Charlies Constable, M.D. [facility]    CRB/MEDQ  D:  11/21/2005  T:  11/22/2005  Job:  [id number]   cc:   Rosalyn Gess. Norins, M.D. [facility]  [address]  [place]  [place] [address]

## 2011-04-22 NOTE — Assessment & Plan Note (Signed)
[facility]                              CARDIOLOGY OFFICE NOTE   NAME:Musto, Tiegan Elbert Ewings                     MRN:          [id number]  DATE:09/18/2006                            DOB:          May 19, 1941    PRIMARY CARE PHYSICIAN:  Rosalyn Gess. Norins, MD   CLINICAL HISTORY:  Mr. Dziedzic is 70 years old and had remote bypass  surgery.  In December, he was admitted with atrial fibrillation and  subsequently had a Cardiolite scan which showed no ischemia but a diminished  ejection fraction.  We started on amiodarone in anticipation of  cardioversion, but he converted on his own.  Subsequently, his ejection  fraction improved from 35 to 40%, which was the previous range.  He had a  previous diaphragmatic wall infarction.   He has done quite well since that time and has had no recent chest pain,  shortness of breath, or palpitations.  He gets up every morning early and  works out on the treadmill.   His past medical history is significant for hyperlipidemia.   His current medications include amiodarone, aspirin, Protonix, Lipitor,  Rely, a study drug, lisinopril.   PHYSICAL EXAMINATION:  VITAL SIGNS:  The blood pressure is 133/72, and the  pulse 68 and regular.  NECK:  There was no venous distention.  The carotid pulses were full without  bruits.  CHEST:  Clear.  CARDIAC:  The cardiac rhythm was regular.  I could hear no murmurs or  gallops.  ABDOMEN:  Soft, normal bowel sounds.  EXTREMITIES:  Peripheral pulses were full.  There was no peripheral edema.   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   Laboratory studies showed normal CBC and renal function.  His HDL was 37,  and his LDL was 92.   IMPRESSION:  1. Coronary artery disease, status post prior diaphragmatic wall      infarction and prior coronary artery bypass graft surgery in 1997.  2. Ischemic cardiomyopathy with an ejection fraction of 35-40%.  3.  Paroxysmal atrial fibrillation, now holding sinus rhythm on amiodarone.  4. Mild aortic stenosis.  5. Hyperlipidemia.   RECOMMENDATIONS:  I think Mr. Wurm is doing quite well.  We will plan to  get an exercise rest/stress Myoview scan in the next week or two.  He says  he thinks he can do better with diet and has had some trouble tolerating  statins, so we will have him work on the diet and  repeat a lipid and liver profile in two months as well as a TSH for  amiodarone surveillance.  I will see him back in followup in six months.  We  will not make any other change in his medications.            ______________________________  Everardo Beals Juanda Chance, MD, [facility]     BRB/MedQ  DD:  09/18/2006  DT:  09/19/2006  Job #:  [id number]

## 2011-04-28 ENCOUNTER — Telehealth: Payer: Self-pay | Admitting: *Deleted

## 2011-04-28 ENCOUNTER — Ambulatory Visit (INDEPENDENT_AMBULATORY_CARE_PROVIDER_SITE_OTHER): Payer: Medicare Other | Admitting: *Deleted

## 2011-04-28 DIAGNOSIS — I4891 Unspecified atrial fibrillation: Secondary | ICD-10-CM

## 2011-04-28 LAB — POCT INR: INR: 2.9

## 2011-04-28 MED ORDER — OLMESARTAN MEDOXOMIL 40 MG PO TABS: 40.0000 mg | ORAL_TABLET | Freq: Every day | ORAL | Status: DC

## 2011-04-28 MED ORDER — SOLIFENACIN SUCCINATE 5 MG PO TABS: 5.0000 mg | ORAL_TABLET | Freq: Every day | ORAL | Status: DC

## 2011-04-28 NOTE — Telephone Encounter (Signed)
Req samples, done, left vm for pt, samples are ready for pick up

## 2011-05-17 ENCOUNTER — Telehealth: Payer: Self-pay | Admitting: *Deleted

## 2011-05-17 NOTE — Telephone Encounter (Signed)
Spoke w/pt - he c/o insect bite, yesterday, unsure what caused it. Area is on inner arm and he req advisement. Area has pinhole opening, no drainage and is (including redness) size of quarter. No pain, itching or irritation. He has used ice to help w/redness. I advised him to wash area with soap and water, monitor and call w/any signs of infection or further questions.

## 2011-05-19 ENCOUNTER — Telehealth: Payer: Self-pay | Admitting: *Deleted

## 2011-05-19 ENCOUNTER — Ambulatory Visit (INDEPENDENT_AMBULATORY_CARE_PROVIDER_SITE_OTHER): Payer: Medicare Other | Admitting: Internal Medicine

## 2011-05-19 ENCOUNTER — Encounter: Payer: Self-pay | Admitting: *Deleted

## 2011-05-19 VITALS — BP 110/62 | HR 59 | Temp 96.8°F | Wt 134.0 lb

## 2011-05-19 DIAGNOSIS — L03114 Cellulitis of left upper limb: Secondary | ICD-10-CM

## 2011-05-19 DIAGNOSIS — IMO0002 Reserved for concepts with insufficient information to code with codable children: Secondary | ICD-10-CM

## 2011-05-19 MED ORDER — DOXYCYCLINE HYCLATE 100 MG PO TABS: 100.0000 mg | ORAL_TABLET | Freq: Two times a day (BID) | ORAL | Status: AC

## 2011-05-19 NOTE — Progress Notes (Signed)
  Subjective:    Patient ID: Jesus [name], male    DOB: 1941/10/20, 70 y.o.   MRN: [id number]  HPI Mr. Collister was picking flowers for his wife and got stuck with a thorn or an insect bite on the inner aspect of the left elbow. The area has become red and warm and tender. He has had no fever. He has no lymphadenopathy.   He has had pain ot the plantar aspect of the left foot with walking.  PMH, FamHx and SocHx reviewed for any changes and relevance.    Review of Systems Review of Systems  Constitutional:  Negative for fever, chills, activity change and unexpected weight change.  HEENT:  Negative for hearing loss, ear pain, congestion, neck stiffness and postnasal drip. Negative for sore throat or swallowing problems. Negative for dental complaints.   Eyes: Negative for vision loss or change in visual acuity.  Respiratory: Negative for chest tightness and wheezing.   Cardiovascular: Negative for chest pain and palpitationNo decreased exercise tolerance Gastrointestinal: No change in bowel habit. No bloating or gas. No reflux or indigestion Genitourinary: Negative for urgency, frequency, flank pain and difficulty urinating.  Musculoskeletal: Negative for myalgias, back pain, arthralgias and gait problem.  Neurological: Negative for dizziness, tremors, weakness and headaches.  Hematological: Negative for adenopathy.  Psychiatric/Behavioral: Negative for behavioral problems and dysphoric mood.       Objective:   Physical Exam Vitals noted - afebrile Gen'l WNWD white male in no distress Pul - normal respirations Cor = RRR Derm - medial aspect left arm just distal to the elbow with a puncture site and surrounding erythema that is warm to the touch. No fluctance or drainage. Nodes - no adenopathy left UE or axilla.       Assessment & Plan:  1. Cellulitis - mild cellulitis from thorn stick. No blisters or purulent lesions.  Plan - doxycycline 100mg  bid x 7  Drug interaction with  warfarin noted - pt [facility] appt at [facility] Friday June 22nd.             Warm compresses.

## 2011-05-19 NOTE — Telephone Encounter (Signed)
Same day abstraction for OV.

## 2011-05-26 ENCOUNTER — Encounter: Payer: Self-pay | Admitting: Cardiovascular Disease

## 2011-05-26 ENCOUNTER — Ambulatory Visit (INDEPENDENT_AMBULATORY_CARE_PROVIDER_SITE_OTHER): Payer: Medicare Other | Admitting: *Deleted

## 2011-05-26 DIAGNOSIS — I4891 Unspecified atrial fibrillation: Secondary | ICD-10-CM

## 2011-06-02 ENCOUNTER — Telehealth: Payer: Self-pay | Admitting: *Deleted

## 2011-06-02 NOTE — Telephone Encounter (Signed)
Wife called - Pt was advised to get apt with ENT or PCP to have ears cleaned out. Left vm for pt to call office and schedule OV w/Dr Norins for eval

## 2011-06-03 ENCOUNTER — Ambulatory Visit (INDEPENDENT_AMBULATORY_CARE_PROVIDER_SITE_OTHER): Payer: Medicare Other | Admitting: Internal Medicine

## 2011-06-03 VITALS — BP 100/64 | HR 60 | Temp 98.0°F | Wt 133.0 lb

## 2011-06-03 DIAGNOSIS — H6120 Impacted cerumen, unspecified ear: Secondary | ICD-10-CM

## 2011-06-05 NOTE — Progress Notes (Signed)
  Subjective:    Patient ID: Jesus [name], male    DOB: September 13, 1941, 70 y.o.   MRN: [id number]  HPI Jesus [name] presents for decreased hearing and probable cerumen impaction. No fever, chills or other signs of infection.  PMH, FamHx and SocHx reviewed for any changes and relevance.    Review of Systems  Constitutional: Negative.   HENT: Positive for hearing loss.   Respiratory: Negative.   Cardiovascular: Negative.   Neurological: Negative.        Objective:   Physical Exam Vitals noted HEENT - post-irrigation: EACs clear, TM's normal       Assessment & Plan:  Cerumen impaction bilaterally - hearing improved after irrigation by Ms. Bullins, CMA

## 2011-06-14 ENCOUNTER — Telehealth: Payer: Self-pay | Admitting: *Deleted

## 2011-06-14 NOTE — Telephone Encounter (Signed)
I am glad he is doing better. No problem with either suppositories or enemas.

## 2011-06-14 NOTE — Telephone Encounter (Signed)
Pt informed

## 2011-06-14 NOTE — Telephone Encounter (Signed)
Pt states that he had "trouble with his intestines Sunday and had to put 'something' up his rearend" and then Monday he had to "go back to the drugstore and get another 'something' and this one cleaned him out and he is doing Ok now". Pt's question derives from a statement on the enema packaging to contact your Doctor if over age 72.? Please advise response.

## 2011-06-20 ENCOUNTER — Other Ambulatory Visit: Payer: Self-pay | Admitting: *Deleted

## 2011-06-20 MED ORDER — PANTOPRAZOLE SODIUM 40 MG PO TBEC: 40.0000 mg | DELAYED_RELEASE_TABLET | Freq: Every day | ORAL | Status: DC

## 2011-06-22 ENCOUNTER — Telehealth: Payer: Self-pay | Admitting: Cardiovascular Disease

## 2011-06-22 NOTE — Telephone Encounter (Signed)
Pt needs pres for Amiodrone called into [facility].  He is completely out.  Please call today.  [facility] has been trying to get since Monday.

## 2011-06-23 ENCOUNTER — Telehealth: Payer: Self-pay

## 2011-06-23 ENCOUNTER — Ambulatory Visit (INDEPENDENT_AMBULATORY_CARE_PROVIDER_SITE_OTHER): Payer: Medicare Other | Admitting: *Deleted

## 2011-06-23 DIAGNOSIS — I4891 Unspecified atrial fibrillation: Secondary | ICD-10-CM

## 2011-06-23 LAB — POCT INR: INR: 2.2

## 2011-06-23 MED ORDER — AMLODIPINE BESYLATE 5 MG PO TABS: 5.0000 mg | ORAL_TABLET | Freq: Every day | ORAL | Status: DC

## 2011-06-23 MED ORDER — AMIODARONE HCL 200 MG PO TABS: 100.0000 mg | ORAL_TABLET | Freq: Every day | ORAL | Status: DC

## 2011-06-23 NOTE — Telephone Encounter (Signed)
Pt needs a refill on Amlodipine sent to [facility], no refills on current rx.  Pt has 6 mo f/u appt with Dr Excell Seltzer scheduled for 07/14/11.

## 2011-07-05 ENCOUNTER — Encounter: Payer: Self-pay | Admitting: Cardiovascular Disease

## 2011-07-06 ENCOUNTER — Telehealth: Payer: Self-pay

## 2011-07-06 NOTE — Telephone Encounter (Signed)
Spoke w/patient - scheduled for apt tomorrow am w/Dr Norins for eval. Pt also may need tetanus - last was 2000.

## 2011-07-06 NOTE — Telephone Encounter (Signed)
Patient called stating that he fell over the mailbox pulling flowers last Saturday. He states that mailbox was rusty and cut his arm. He would like to know what needs to be done

## 2011-07-07 ENCOUNTER — Ambulatory Visit (INDEPENDENT_AMBULATORY_CARE_PROVIDER_SITE_OTHER): Payer: Medicare Other | Admitting: Internal Medicine

## 2011-07-07 VITALS — BP 120/62 | HR 56 | Temp 97.9°F | Wt 130.0 lb

## 2011-07-07 DIAGNOSIS — T148XXA Other injury of unspecified body region, initial encounter: Secondary | ICD-10-CM

## 2011-07-09 NOTE — Progress Notes (Signed)
  Subjective:    Patient ID: SEKOU ZUCKERMAN, male    DOB: 01-30-1941, 70 y.o.   MRN: [id number]  HPI Mr. Tomes had a fall and sustained a minor laceration to the right forearm. He presents for up-dating of tetanus. He has had no fever, pain in the arm, red streaks or any symptoms to suggest infection .  I have reviewed the patient's medical history in detail and updated the computerized patient record.    Review of Systems  Respiratory: Negative.   Cardiovascular: Negative.   Musculoskeletal: Negative.   Skin:       See HPI  Neurological: Negative.   Hematological: Negative.        Objective:   Physical Exam Vitals reviewed - no fever Cor - 2+ radial pulse Resp - normal respsirations Derm - minor laceration right porximal foream, no erythem around the wound, no warmth.       Assessment & Plan:  Minor laceration from fall while working out of doors.  Plan - tetanus/Tdap booster.

## 2011-07-13 ENCOUNTER — Other Ambulatory Visit (HOSPITAL_COMMUNITY): Payer: Self-pay | Admitting: Radiology

## 2011-07-13 DIAGNOSIS — I350 Nonrheumatic aortic (valve) stenosis: Secondary | ICD-10-CM

## 2011-07-14 ENCOUNTER — Encounter: Payer: Self-pay | Admitting: Cardiovascular Disease

## 2011-07-14 ENCOUNTER — Ambulatory Visit (INDEPENDENT_AMBULATORY_CARE_PROVIDER_SITE_OTHER): Payer: Medicare Other | Admitting: Cardiovascular Disease

## 2011-07-14 ENCOUNTER — Ambulatory Visit (INDEPENDENT_AMBULATORY_CARE_PROVIDER_SITE_OTHER): Payer: Medicare Other | Admitting: *Deleted

## 2011-07-14 ENCOUNTER — Encounter: Payer: Medicare Other | Admitting: *Deleted

## 2011-07-14 ENCOUNTER — Ambulatory Visit (INDEPENDENT_AMBULATORY_CARE_PROVIDER_SITE_OTHER)
Admission: RE | Admit: 2011-07-14 | Discharge: 2011-07-14 | Disposition: A | Discharge status: Home / Self Care | Payer: Medicare Other | Source: Ambulatory Visit | Attending: Cardiovascular Disease | Admitting: Cardiovascular Disease

## 2011-07-14 ENCOUNTER — Ambulatory Visit (HOSPITAL_COMMUNITY): Payer: Medicare Other | Attending: Internal Medicine | Admitting: Radiology

## 2011-07-14 DIAGNOSIS — E0590 Thyrotoxicosis, unspecified without thyrotoxic crisis or storm: Secondary | ICD-10-CM | POA: Insufficient documentation

## 2011-07-14 DIAGNOSIS — I350 Nonrheumatic aortic (valve) stenosis: Secondary | ICD-10-CM

## 2011-07-14 DIAGNOSIS — I2589 Other forms of chronic ischemic heart disease: Secondary | ICD-10-CM

## 2011-07-14 DIAGNOSIS — I4891 Unspecified atrial fibrillation: Secondary | ICD-10-CM

## 2011-07-14 DIAGNOSIS — I2510 Atherosclerotic heart disease of native coronary artery without angina pectoris: Secondary | ICD-10-CM

## 2011-07-14 DIAGNOSIS — I10 Essential (primary) hypertension: Secondary | ICD-10-CM | POA: Insufficient documentation

## 2011-07-14 DIAGNOSIS — E785 Hyperlipidemia, unspecified: Secondary | ICD-10-CM | POA: Insufficient documentation

## 2011-07-14 DIAGNOSIS — I359 Nonrheumatic aortic valve disorder, unspecified: Secondary | ICD-10-CM

## 2011-07-14 DIAGNOSIS — K219 Gastro-esophageal reflux disease without esophagitis: Secondary | ICD-10-CM | POA: Insufficient documentation

## 2011-07-14 DIAGNOSIS — I079 Rheumatic tricuspid valve disease, unspecified: Secondary | ICD-10-CM | POA: Insufficient documentation

## 2011-07-14 DIAGNOSIS — I059 Rheumatic mitral valve disease, unspecified: Secondary | ICD-10-CM | POA: Insufficient documentation

## 2011-07-14 LAB — POCT INR: INR: 2.1

## 2011-07-14 NOTE — Patient Instructions (Signed)
Your physician wants you to follow-up in: 6 months with Dr. Excell Seltzer.  You will receive a reminder letter in the mail two months in advance. If you don't receive a letter, please call our office to schedule the follow-up appointment.  Have chest X-ray done at [facility] office on [address].  You had lab work done today. Jesus [name]

## 2011-07-15 LAB — HEPATIC FUNCTION PANEL
Albumin: 4.3 g/dL (ref 3.5–5.2)
Alkaline Phosphatase: 115 U/L (ref 39–117)
Bilirubin, Direct: 0.2 mg/dL (ref 0.0–0.3)
Total Protein: 7.2 g/dL (ref 6.0–8.3)

## 2011-07-15 LAB — BASIC METABOLIC PANEL
CO2: 28 mEq/L (ref 19–32)
Chloride: 106 mEq/L (ref 96–112)
Potassium: 4.9 mEq/L (ref 3.5–5.1)
Sodium: 141 mEq/L (ref 135–145)

## 2011-07-15 LAB — TSH: TSH: 2.01 u[IU]/mL (ref 0.35–5.50)

## 2011-07-16 ENCOUNTER — Encounter: Payer: Self-pay | Admitting: Cardiovascular Disease

## 2011-07-16 NOTE — Assessment & Plan Note (Signed)
The patient has a history of myocardial infarction, PCI, and coronary bypass surgery. He has no angina and just had a treadmill study 6 months ago with no ischemic EKG changes. We'll continue his current medical program.

## 2011-07-16 NOTE — Assessment & Plan Note (Addendum)
I'm concerned about the degree of LV dysfunction described on his recent echocardiogram. I will compare his 2 most recent echo studies to see if there is truly progressive LV dysfunction orifices inter-reader variability.  The He is on a good medical regimen with the use of Benicar. With a heart rate of 49  I don't think we can add a beta blocker.  ADDENDUM (08/15/11): I've reviewed the patient's echo studies from 2010, Feb 2012, and 08-07-11. I don't appreciate any significant change in his LVEF over this time period...there is inferoposterior hypokinesis on all studies with other segments showing normal contraction pattern. I would estimate the LVEF at 40-45%. The patient's AS has progressed since 2010, but is unchanged over the 2012 studies (mean gradient less than 40). He is asymptomatic. Will continue his current med Rx and follow-up in 6 months, repeat echo 1 year. All of this was discussed with the patient on the telephone.

## 2011-07-16 NOTE — Assessment & Plan Note (Signed)
The patient has moderate aortic stenosis.  Continue with clinical followup.

## 2011-07-16 NOTE — Assessment & Plan Note (Signed)
The patient is maintaining sinus rhythm. He is on low-dose amiodarone 100 mg daily. He needs followup amiodarone lab work to check his TSH and liver function tests. He also should have a chest x-ray to rule out amiodarone lung toxicity. He is maintained on chronic anticoagulation with warfarin.

## 2011-07-16 NOTE — Progress Notes (Signed)
HPI:  This is a 70 year old gentleman returning for followup evaluation. The patient has coronary artery disease, mixed cardiomyopathy, atrial fibrillation, and aortic stenosis. His last echocardiogram and exercise treadmill study were about 6 months ago. He did very well on his treadmill study as he exercised for 9 minutes on the Bruce protocol, he had no symptoms, EKG changes, or arrhythmia with exercise. His echocardiogram demonstrated a left ventricular ejection fraction of 40% with moderately severe aortic stenosis. He underwent a repeat echocardiogram today and this showed severe LV dysfunction with an ejection fraction of 30-35%. There was moderate aortic stenosis noted.  The patient feels well. He walks regularly for exercise and has no symptoms with exertion. He leads a very active lifestyle. He specifically denies chest pain or pressure, dyspnea, or lightheadedness.  Outpatient Encounter Prescriptions as of 07/14/2011  Medication Sig Dispense Refill  . amiodarone (PACERONE) 200 MG tablet Take 0.5 tablets (100 mg total) by mouth daily.  30 tablet  3  . amLODipine (NORVASC) 5 MG tablet Take 1 tablet (5 mg total) by mouth daily.  30 tablet  6  . Ascorbic Acid (VITAMIN C) 1000 MG tablet Take 1,000 mg by mouth daily.        [name] aspirin 81 MG tablet Take 81 mg by mouth daily.        [name] atorvastatin (LIPITOR) 80 MG tablet Take 40 mg by mouth daily.        [name] loratadine (CLARITIN) 10 MG tablet Take 10 mg by mouth daily as needed.        . Multiple Vitamins-Minerals (MULTIVITAMIN,TX-MINERALS) tablet Take 1 tablet by mouth daily.        [name] olmesartan (BENICAR) 40 MG tablet Take 1 tablet (40 mg total) by mouth daily.  30 tablet  0  . pantoprazole (PROTONIX) 40 MG tablet Take 1 tablet (40 mg total) by mouth daily.  30 tablet  3  . warfarin (COUMADIN) 5 MG tablet Take by mouth as directed.        [name] DISCONTD: solifenacin (VESICARE) 5 MG tablet Take 1 tablet (5 mg total) by mouth daily.  30 tablet  0  .  DISCONTD: triamcinolone (NASACORT) 55 MCG/ACT nasal inhaler Place 2 sprays into the nose as needed.          Allergies  Allergen Reactions  . Codeine   . Iodine     Past Medical History  Diagnosis Date  . Esophageal reflux   . Atrial fibrillation     holding sinus rhythm on Amiodarone  . Aortic stenosis     mild with a mean aortic valve gradient of 12 mmHg  . CAD (coronary artery disease) 1997    CABG  . Unspecified essential hypertension   . History of transesophageal echocardiography (TEE) for monitoring   . Cardiomyopathy   . Other and unspecified hyperlipidemia   . Skin lesions, generalized     facial which may represent actinic keratoses and possible photosensitivity from Amiodarone  . GERD (gastroesophageal reflux disease)   . History of colonoscopy     ROS: Positive for constipation, otherwise Negative except as per HPI  BP 146/72  Pulse 49  Ht 5\' 1"  (1.549 m)  Wt 131 lb 12.8 oz (59.784 kg)  BMI 24.90 kg/m2  PHYSICAL EXAM: Pt is alert and oriented, elderly male in NAD HEENT: normal Neck: JVP - normal, carotids 2+= with bilateral bruits Lungs: CTA bilaterally CV: RRR with 3/6 harsh systolic murmur along the left sternal border Abd: soft,  NT, Positive BS, no hepatomegaly Ext: no C/C/E, distal pulses intact and equal Skin: warm/dry no rash  EKG:  Marked sinus bradycardia 49 beats per minute, age indeterminate inferior infarction, age indeterminate lateral infarction, no significant ST or T wave changes  ASSESSMENT AND PLAN:

## 2011-07-28 ENCOUNTER — Telehealth: Payer: Self-pay | Admitting: Cardiovascular Disease

## 2011-07-28 NOTE — Telephone Encounter (Signed)
Patient aware of results.

## 2011-07-28 NOTE — Telephone Encounter (Signed)
Pt had blood work & echo done 2 wks ago and still has not gotten his results

## 2011-08-01 ENCOUNTER — Telehealth: Payer: Self-pay | Admitting: Internal Medicine

## 2011-08-01 NOTE — Telephone Encounter (Signed)
Left a message at patient's work to call me.

## 2011-08-01 NOTE — Telephone Encounter (Signed)
Spoke with patient and he states he had a bowel movement now. Discussed with patient that he can use Miralax daily if this continues to be an issue for him

## 2011-08-10 ENCOUNTER — Telehealth: Payer: Self-pay | Admitting: Cardiovascular Disease

## 2011-08-10 NOTE — Telephone Encounter (Signed)
Test results

## 2011-08-10 NOTE — Telephone Encounter (Signed)
Dr. Excell Seltzer was to review echo and compare with previous echo. I told pt I would forward this note to Dr. Excell Seltzer to see if any new comments or recommendations and call pt back after Dr. Excell Seltzer reviewed

## 2011-08-11 ENCOUNTER — Ambulatory Visit (INDEPENDENT_AMBULATORY_CARE_PROVIDER_SITE_OTHER): Payer: Medicare Other | Admitting: *Deleted

## 2011-08-11 DIAGNOSIS — I4891 Unspecified atrial fibrillation: Secondary | ICD-10-CM

## 2011-08-18 NOTE — Telephone Encounter (Signed)
Dr. Excell Seltzer spoke with pt.

## 2011-09-08 ENCOUNTER — Ambulatory Visit (INDEPENDENT_AMBULATORY_CARE_PROVIDER_SITE_OTHER): Payer: Medicare Other | Admitting: *Deleted

## 2011-09-08 DIAGNOSIS — I4891 Unspecified atrial fibrillation: Secondary | ICD-10-CM

## 2011-09-08 LAB — POCT INR: INR: 2.1

## 2011-09-08 MED ORDER — WARFARIN SODIUM 5 MG PO TABS: ORAL_TABLET | ORAL | Status: DC

## 2011-09-12 ENCOUNTER — Other Ambulatory Visit: Payer: Self-pay

## 2011-09-12 MED ORDER — WARFARIN SODIUM 5 MG PO TABS: 5.0000 mg | ORAL_TABLET | ORAL | Status: DC

## 2011-10-06 ENCOUNTER — Ambulatory Visit (INDEPENDENT_AMBULATORY_CARE_PROVIDER_SITE_OTHER): Payer: Medicare Other | Admitting: *Deleted

## 2011-10-06 DIAGNOSIS — I4891 Unspecified atrial fibrillation: Secondary | ICD-10-CM

## 2011-10-06 LAB — POCT INR: INR: 2.1

## 2011-10-19 ENCOUNTER — Other Ambulatory Visit: Payer: Self-pay | Admitting: *Deleted

## 2011-10-19 MED ORDER — PANTOPRAZOLE SODIUM 40 MG PO TBEC: 40.0000 mg | DELAYED_RELEASE_TABLET | Freq: Every day | ORAL | Status: DC

## 2011-11-03 ENCOUNTER — Ambulatory Visit (INDEPENDENT_AMBULATORY_CARE_PROVIDER_SITE_OTHER): Payer: Medicare Other | Admitting: *Deleted

## 2011-11-03 DIAGNOSIS — I4891 Unspecified atrial fibrillation: Secondary | ICD-10-CM

## 2011-12-01 ENCOUNTER — Ambulatory Visit (INDEPENDENT_AMBULATORY_CARE_PROVIDER_SITE_OTHER): Payer: Medicare Other | Admitting: *Deleted

## 2011-12-01 DIAGNOSIS — I4891 Unspecified atrial fibrillation: Secondary | ICD-10-CM

## 2011-12-02 ENCOUNTER — Other Ambulatory Visit: Payer: Self-pay

## 2011-12-08 ENCOUNTER — Other Ambulatory Visit: Payer: Self-pay | Admitting: *Deleted

## 2011-12-08 MED ORDER — OLMESARTAN MEDOXOMIL 40 MG PO TABS: 40.0000 mg | ORAL_TABLET | Freq: Every day | ORAL | Status: DC

## 2011-12-08 NOTE — Telephone Encounter (Signed)
Done

## 2011-12-14 ENCOUNTER — Ambulatory Visit (INDEPENDENT_AMBULATORY_CARE_PROVIDER_SITE_OTHER): Payer: Medicare Other

## 2011-12-14 DIAGNOSIS — Z2911 Encounter for prophylactic immunotherapy for respiratory syncytial virus (RSV): Secondary | ICD-10-CM

## 2011-12-14 DIAGNOSIS — Z23 Encounter for immunization: Secondary | ICD-10-CM

## 2011-12-26 ENCOUNTER — Telehealth: Payer: Self-pay | Admitting: Cardiovascular Disease

## 2011-12-26 DIAGNOSIS — I2510 Atherosclerotic heart disease of native coronary artery without angina pectoris: Secondary | ICD-10-CM

## 2011-12-26 DIAGNOSIS — I4891 Unspecified atrial fibrillation: Secondary | ICD-10-CM

## 2011-12-26 NOTE — Telephone Encounter (Signed)
Pt to see cooper 01-10-12 and said he was to have blood work, do not see an order, pt would like to go to [place] on 1-28

## 2011-12-27 NOTE — Telephone Encounter (Signed)
This pt had amiodarone surveillance labs checked in August.  I will forward this message to Dr Excell Seltzer to see if he would like labs drawn prior to appointment.

## 2011-12-28 NOTE — Telephone Encounter (Signed)
I spoke with the pt's wife and made her aware that the pt can have labs drawn.  Orders will be placed in system.

## 2011-12-28 NOTE — Telephone Encounter (Signed)
Yes - should have CBC, CMET, TSH, and LIPIDS

## 2012-01-02 ENCOUNTER — Other Ambulatory Visit: Payer: Medicare Other

## 2012-01-02 ENCOUNTER — Other Ambulatory Visit (INDEPENDENT_AMBULATORY_CARE_PROVIDER_SITE_OTHER): Payer: Medicare Other

## 2012-01-02 DIAGNOSIS — I4891 Unspecified atrial fibrillation: Secondary | ICD-10-CM

## 2012-01-02 DIAGNOSIS — I2510 Atherosclerotic heart disease of native coronary artery without angina pectoris: Secondary | ICD-10-CM

## 2012-01-02 LAB — CBC WITH DIFFERENTIAL/PLATELET
Basophils Absolute: 0 10*3/uL (ref 0.0–0.1)
Eosinophils Relative: 1.3 % (ref 0.0–5.0)
Lymphocytes Relative: 20.2 % (ref 12.0–46.0)
Monocytes Relative: 10.7 % (ref 3.0–12.0)
Neutro Abs: 4.5 10*3/uL (ref 1.4–7.7)
RDW: 14 % (ref 11.5–14.6)

## 2012-01-02 LAB — BASIC METABOLIC PANEL
CO2: 29 mEq/L (ref 19–32)
Chloride: 106 mEq/L (ref 96–112)
Creatinine, Ser: 0.8 mg/dL (ref 0.4–1.5)
Sodium: 142 mEq/L (ref 135–145)

## 2012-01-02 LAB — LIPID PANEL
Cholesterol: 144 mg/dL (ref 0–200)
LDL Cholesterol: 89 mg/dL (ref 0–99)
Triglycerides: 49 mg/dL (ref 0.0–149.0)
VLDL: 9.8 mg/dL (ref 0.0–40.0)

## 2012-01-02 LAB — HEPATIC FUNCTION PANEL
Bilirubin, Direct: 0.2 mg/dL (ref 0.0–0.3)
Total Bilirubin: 1 mg/dL (ref 0.3–1.2)

## 2012-01-05 ENCOUNTER — Telehealth: Payer: Self-pay | Admitting: Cardiovascular Disease

## 2012-01-05 NOTE — Telephone Encounter (Signed)
Pt aware of lab results 

## 2012-01-05 NOTE — Telephone Encounter (Signed)
FU Call: Pt said he labs drawn at [facility] lab on 01/02/12. Please return pt call to discuss further.

## 2012-01-10 ENCOUNTER — Ambulatory Visit (INDEPENDENT_AMBULATORY_CARE_PROVIDER_SITE_OTHER): Payer: Medicare Other | Admitting: *Deleted

## 2012-01-10 ENCOUNTER — Ambulatory Visit (INDEPENDENT_AMBULATORY_CARE_PROVIDER_SITE_OTHER): Payer: Medicare Other | Admitting: Cardiovascular Disease

## 2012-01-10 DIAGNOSIS — I4891 Unspecified atrial fibrillation: Secondary | ICD-10-CM

## 2012-01-10 DIAGNOSIS — E785 Hyperlipidemia, unspecified: Secondary | ICD-10-CM

## 2012-01-10 DIAGNOSIS — I359 Nonrheumatic aortic valve disorder, unspecified: Secondary | ICD-10-CM

## 2012-01-10 DIAGNOSIS — I2510 Atherosclerotic heart disease of native coronary artery without angina pectoris: Secondary | ICD-10-CM

## 2012-01-10 NOTE — Progress Notes (Signed)
HPI:  Jesus [name] is a 71 year old gentleman returning for followup evaluation.  He has coronary artery disease, mixed cardiomyopathy, paroxysmal atrial fibrillation, and aortic stenosis. The patient's left ventricular function is moderately impaired with an ejection fraction estimated between 30 and 40%.  He has been noted to have moderate aortic stenosis with a mean valve gradient of 35 mm mercury.  From a symptomatic standpoint, the patient is doing well. He denies chest pain or dyspnea. He walks on the treadmill for 30 minutes 3 days per week. He denies edema, palpitations, orthopnea, PND, lightheadedness, or syncope.  Outpatient Encounter Prescriptions as of 01/10/2012  Medication Sig Dispense Refill  . amiodarone (PACERONE) 200 MG tablet Take 0.5 tablets (100 mg total) by mouth daily.  30 tablet  3  . amLODipine (NORVASC) 5 MG tablet Take 1 tablet (5 mg total) by mouth daily.  30 tablet  6  . Ascorbic Acid (VITAMIN C) 1000 MG tablet Take 1,000 mg by mouth daily.        [name] aspirin 81 MG tablet Take 81 mg by mouth daily.        [name] atorvastatin (LIPITOR) 80 MG tablet Take 40 mg by mouth daily.        [name] loratadine (CLARITIN) 10 MG tablet Take 10 mg by mouth daily as needed.        . Multiple Vitamins-Minerals (MULTIVITAMIN,TX-MINERALS) tablet Take 1 tablet by mouth daily.        [name] olmesartan (BENICAR) 40 MG tablet Take 1 tablet (40 mg total) by mouth daily.  30 tablet  5  . pantoprazole (PROTONIX) 40 MG tablet Take 1 tablet (40 mg total) by mouth daily.  30 tablet  3  . warfarin (COUMADIN) 5 MG tablet Take 1 tablet (5 mg total) by mouth as directed.  30 tablet  3    Allergies  Allergen Reactions  . Codeine   . Iodine     Past Medical History  Diagnosis Date  . Esophageal reflux   . Atrial fibrillation     holding sinus rhythm on Amiodarone  . Aortic stenosis     mild with a mean aortic valve gradient of 12 mmHg  . CAD (coronary artery disease) 1997    CABG  . Unspecified essential  hypertension   . History of transesophageal echocardiography (TEE) for monitoring   . Cardiomyopathy   . Other and unspecified hyperlipidemia   . Skin lesions, generalized     facial which may represent actinic keratoses and possible photosensitivity from Amiodarone  . GERD (gastroesophageal reflux disease)   . History of colonoscopy     ROS: Negative except as per HPI  BP 122/68  Pulse 55  Ht 5\' 2"  (1.575 m)  Wt 62.143 kg (137 lb)  BMI 25.06 kg/m2  PHYSICAL EXAM: Pt is alert and oriented, NAD HEENT: normal Neck: JVP - normal, carotids 2+=  Lungs: CTA bilaterally CV: RRR with grade 3/6 harsh crescendo/decrescendo murmur loudest at the right upper sternal border Abd: soft, NT, Positive BS, no hepatomegaly Ext: no C/C/E, distal pulses intact and equal Skin: warm/dry no rash  EKG:  Sinus bradycardia 55 beats per minute, inferior infarct age undetermined, nonspecific IVCD.  ASSESSMENT AND PLAN:

## 2012-01-10 NOTE — Assessment & Plan Note (Signed)
The patient has coronary artery disease status post coronary bypass surgery. He's had previous myocardial infarction. He has concomitant aortic stenosis and at least moderate LV dysfunction. I have recommended an exercise Myoview stress scan to evaluate for significant ischemia in the setting of his multiple cardiac problems.

## 2012-01-10 NOTE — Assessment & Plan Note (Signed)
The patient has at least moderate aortic stenosis. He will need a repeat echocardiogram prior to his next office visit in 6 months. We discussed the implications of this and the patient understands.

## 2012-01-10 NOTE — Patient Instructions (Signed)
Your physician has requested that you have an exercise stress myoview (please schedule on a day Dr Excell Seltzer is in the office). For further information please visit [email or web address] Please follow instruction sheet, as given.  Your physician wants you to follow-up in: 6 MONTHS. You will receive a reminder letter in the mail two months in advance. If you don't receive a letter, please call our office to schedule the follow-up appointment.  Your physician has requested that you have an echocardiogram in 6 MONTHS. Echocardiography is a painless test that uses sound waves to create images of your heart. It provides your doctor with information about the size and shape of your heart and how well your heart's chambers and valves are working. This procedure takes approximately one hour. There are no restrictions for this procedure.  Your physician recommends that you continue on your current medications as directed. Please refer to the Current Medication list given to you today.

## 2012-01-10 NOTE — Assessment & Plan Note (Signed)
Recent blood work was reviewed. His lipids are at goal on high-dose Lipitor.

## 2012-01-10 NOTE — Assessment & Plan Note (Signed)
He is tolerating chronic anticoagulation well. He remains in sinus rhythm. The patient is on amiodarone and is recent lab work was reviewed it shows normal liver function tests are normal thyroid function.

## 2012-01-23 ENCOUNTER — Ambulatory Visit (HOSPITAL_COMMUNITY): Payer: Medicare Other | Attending: Cardiology | Admitting: Radiology

## 2012-01-23 DIAGNOSIS — I359 Nonrheumatic aortic valve disorder, unspecified: Secondary | ICD-10-CM

## 2012-01-23 DIAGNOSIS — I10 Essential (primary) hypertension: Secondary | ICD-10-CM | POA: Insufficient documentation

## 2012-01-23 DIAGNOSIS — I252 Old myocardial infarction: Secondary | ICD-10-CM | POA: Insufficient documentation

## 2012-01-23 DIAGNOSIS — E785 Hyperlipidemia, unspecified: Secondary | ICD-10-CM | POA: Insufficient documentation

## 2012-01-23 DIAGNOSIS — I2510 Atherosclerotic heart disease of native coronary artery without angina pectoris: Secondary | ICD-10-CM

## 2012-01-23 DIAGNOSIS — R9431 Abnormal electrocardiogram [ECG] [EKG]: Secondary | ICD-10-CM

## 2012-01-23 DIAGNOSIS — Z8249 Family history of ischemic heart disease and other diseases of the circulatory system: Secondary | ICD-10-CM | POA: Insufficient documentation

## 2012-01-23 DIAGNOSIS — I4891 Unspecified atrial fibrillation: Secondary | ICD-10-CM | POA: Insufficient documentation

## 2012-01-23 MED ORDER — TECHNETIUM TC 99M TETROFOSMIN IV KIT
11.0000 | PACK | Freq: Once | INTRAVENOUS | Status: AC | PRN
  Administered 2012-01-23: 11 via INTRAVENOUS

## 2012-01-23 MED ORDER — TECHNETIUM TC 99M TETROFOSMIN IV KIT
33.0000 | PACK | Freq: Once | INTRAVENOUS | Status: AC | PRN
  Administered 2012-01-23: 33 via INTRAVENOUS

## 2012-01-23 NOTE — Progress Notes (Signed)
[facility] SITE 3 NUCLEAR MED [address] [place] [address] [phone]  Cardiology Nuclear Med Study  Jesus [name] is a 71 y.o. male [id number] 08/16/1941   Nuclear Med Background Indication for Stress Test:  Evaluation for Ischemia and Graft Patency History:  '80 MI,'97 Heart Catheterization>CABG,'06 Cardioversion,'09 MPS:EF=41%,Large inferior/lateral scar,no ischemia,2/12 YQI:HKVQQV, 8/12 Echo:EF=30-35% with moderate AS and Abnormal EKG:NS with IVCD and IWMI Cardiac Risk Factors: Family History - CAD, Hypertension and Lipids  Symptoms:  none   Nuclear Pre-Procedure Caffeine/Decaff Intake:  None NPO After: 7:00pm   Lungs:  clear IV 0.9% NS with Angio Cath:  20g  IV Site: R Forearm  IV Started by:  Stanton Kidney, EMT-P  Chest Size (in):  40 Cup Size: n/a  Height: 5\' 2"  (1.575 m)  Weight:  134 lb (60.782 kg)  BMI:  Body mass index is 24.51 kg/(m^2). Tech Comments:  none    Nuclear Med Study 1 or 2 day study: 1 day  Stress Test Type:  Stress  Reading MD: Marca Ancona, MD  Order Authorizing Provider:  Casimiro Needle Cooper,MD  Resting Radionuclide: Technetium 32m Tetrofosmin  Resting Radionuclide Dose: 11.0 mCi   Stress Radionuclide:  Technetium 13m Tetrofosmin  Stress Radionuclide Dose: 33.0 mCi           Stress Protocol Rest HR: 62 Stress HR: 150  Rest BP: 143/86 Stress BP: 151/68  Exercise Time (min): 10:24 METS: 12:30   Predicted Max HR: 148 bpm % Max HR: 101.35 bpm Rate Pressure Product: 95638   Dose of Adenosine (mg):  n/a Dose of Lexiscan: n/a mg  Dose of Atropine (mg): n/a Dose of Dobutamine: n/a mcg/kg/min (at max HR)  Stress Test Technologist: Cathlyn Parsons, RN  Nuclear Technologist:  Doyne Keel, CNMT     Rest Procedure:  Myocardial perfusion imaging was performed at rest 45 minutes following the intravenous administration of Technetium 82m Tetrofosmin. Rest ECG: NSR  Stress Procedure:  The patient exercised for 10:24.  The  patient stopped due to target HR achieved and denied any chest pain.  There were nonspecific ST-T wave changes.  No ectopy noted.Technetium 11m Tetrofosmin was injected at peak exercise and myocardial perfusion imaging was performed after a brief delay. Stress ECG: No significant change from baseline ECG  QPS Raw Data Images:  Normal; no motion artifact; normal heart/lung ratio. Stress Images:  Large, severe inferior and inferolateral perfusion defect.  Rest Images:  Large, severe inferior and inferolateral perfusion defect.  Subtraction (SDS):  Large, fixed, severe inferior and inferolateral perfusion defect.   Transient Ischemic Dilatation (Normal <1.22):  0.95 Lung/Heart Ratio (Normal <0.45):  0.23  Quantitative Gated Spect Images QGS EDV:  128 ml QGS ESV:  77 ml QGS cine images:  Inferior and inferolateral perfusion defect.  QGS EF: 40%  Impression Exercise Capacity:  Excellent exercise capacity. BP Response:  Hypotensive blood pressure response. Clinical Symptoms:  No chest pain.  ECG Impression:  No significant ST segment change suggestive of ischemia. Comparison with Prior Nuclear Study: No significant change from previous study  Overall Impression:  Abnormal stress nuclear study.  Large, fixed, severe inferior and inferolateral perfusion defect suggestive of prior MI.  No significant ischemia.  EF 40% with inferior and inferolateral hypokinesis.  Latricia Cerrito [employer]

## 2012-01-23 NOTE — Progress Notes (Deleted)
[facility] SITE 3 NUCLEAR MED [address] [place] [address] [phone]  Cardiology Nuclear Med Study  Jesus [name] is a 71 y.o. male [id number] [birth year]   Nuclear Med Background Indication for Stress Test:  Evaluation for Ischemia and Graft Patency History:  {CHL HISTORY STRESS TEST:21021012} Cardiac Risk Factors: Family History - CAD, Hypertension and Lipids  Symptoms:  {CHL SYMPTOMS STRESS TEST:21021013}   Nuclear Pre-Procedure Caffeine/Decaff Intake:  None NPO After: 7:00pm   Lungs:  *** IV 0.9% NS with Angio Cath:  20g  IV Site: R Antecubital  IV Started by:  Stanton Kidney, EMT-P  Chest Size (in):  40 Cup Size: n/a  Height: 5\' 2"  (1.575 m)  Weight:  134 lb (60.782 kg)  BMI:  Body mass index is 24.51 kg/(m^2). Tech Comments:  NA    Nuclear Med Study 1 or 2 day study: {CHL 1 OR 2 DAY STUDY:21021019}  Stress Test Type:  {CHL STRESS TEST TYPE:21021018}  Reading MD: {CHL LB NUC READING OZ:30865784}  Order Authorizing Provider:  ***  Resting Radionuclide: {CHL RESTING RADIONUCLIDE:21021021}  Resting Radionuclide Dose: *** mCi   Stress Radionuclide:  {CHL STRESS RADIONUCLIDE:21021022}  Stress Radionuclide Dose: *** mCi           Stress Protocol Rest HR: *** Stress HR: ***  Rest BP: *** Stress BP: ***  Exercise Time (min): {NA AND WILDCARD:21589} METS: {NA AND WILDCARD:21589}          Dose of Adenosine (mg):  {NA AND ONGEXBMW:41324} Dose of Lexiscan: {CHL CARD WILDCARD AND 0.4:21590} mg  Dose of Atropine (mg): {NA AND MWNUUVOZ:36644} Dose of Dobutamine: {NA AND WILDCARD:21589} mcg/kg/min (at max HR)  Stress Test Technologist: {CHL LB STRESS TEST TECHNOLOGIST:21021024}  Nuclear Technologist:  {CHL LB NUCLEAR TECHNOLOGIST:21021025}     Rest Procedure:  {CHL REST PROCEDURE NUCLEAR:21021027} Rest ECG: {CHL REST IHK:74259}  Stress Procedure:  {CHL STRESS PROCEDURE NUCLEAR:21021028} Stress ECG: {CHL CAR STRESS ECG:21561}  QPS Raw Data  Images:  {CHL RAW DATA IMAGES NUC:21021029} Stress Images:  {CHL STRESS IMAGES NUC:21021030} Rest Images:  {CHL REST IMAGES NUC:21021031} Subtraction (SDS):  {CHL SUBTRACTION (SDS) NUC:21021032} Transient Ischemic Dilatation (Normal <1.22):  *** Lung/Heart Ratio (Normal <0.45):  ***  Quantitative Gated Spect Images QGS EDV:  *** ml QGS ESV:  *** ml QGS cine images:  {CHL CARD QGS:21591:o} QGS EF: {CHL CARD STUDY NOT GATED:21592:o}  Impression Exercise Capacity:  {CHL EXERCISE CAPACITY NUC:21021037} BP Response:  {CHL BP RESPONSE NUC:21021038} Clinical Symptoms:  {CHL CLINICAL SYMPTOMS NUC:21021039} ECG Impression:  {CHL ECG IMPRESSION NUC:21021040} Comparison with Prior Nuclear Study: {CHL NUCLEAR STUDY COMPARISON:21562}  Overall Impression:  {CHL OVERALL IMPRESSION NUC:21021041}

## 2012-01-30 ENCOUNTER — Telehealth: Payer: Self-pay | Admitting: Cardiovascular Disease

## 2012-01-30 NOTE — Telephone Encounter (Signed)
Pt aware of myoview results 

## 2012-01-30 NOTE — Telephone Encounter (Signed)
Fu call °Patient returning your call °

## 2012-01-30 NOTE — Telephone Encounter (Signed)
The pt is currently not at home.  I will try to reach him before I leave today.

## 2012-02-06 ENCOUNTER — Other Ambulatory Visit: Payer: Self-pay | Admitting: *Deleted

## 2012-02-06 MED ORDER — ATORVASTATIN CALCIUM 80 MG PO TABS: 40.0000 mg | ORAL_TABLET | Freq: Every day | ORAL | Status: DC

## 2012-02-06 NOTE — Telephone Encounter (Signed)
Done

## 2012-02-13 ENCOUNTER — Other Ambulatory Visit: Payer: Self-pay | Admitting: *Deleted

## 2012-02-13 ENCOUNTER — Other Ambulatory Visit: Payer: Self-pay

## 2012-02-13 MED ORDER — AMLODIPINE BESYLATE 5 MG PO TABS: 5.0000 mg | ORAL_TABLET | Freq: Every day | ORAL | Status: DC

## 2012-02-13 MED ORDER — PANTOPRAZOLE SODIUM 40 MG PO TBEC: 40.0000 mg | DELAYED_RELEASE_TABLET | Freq: Every day | ORAL | Status: DC

## 2012-02-13 NOTE — Telephone Encounter (Signed)
..   Requested Prescriptions   Signed Prescriptions Disp Refills  . amLODipine (NORVASC) 5 MG tablet 30 tablet 10    Sig: Take 1 tablet (5 mg total) by mouth daily.    Authorizing Provider: [name]    Ordering User: Christella Hartigan, Kayla Deshaies Judie Petit

## 2012-02-14 ENCOUNTER — Ambulatory Visit (INDEPENDENT_AMBULATORY_CARE_PROVIDER_SITE_OTHER): Payer: Medicare Other | Admitting: Pharmacist

## 2012-02-14 DIAGNOSIS — I4891 Unspecified atrial fibrillation: Secondary | ICD-10-CM

## 2012-02-14 LAB — POCT INR: INR: 2.1

## 2012-02-14 MED ORDER — WARFARIN SODIUM 5 MG PO TABS: 5.0000 mg | ORAL_TABLET | ORAL | Status: DC

## 2012-02-20 ENCOUNTER — Other Ambulatory Visit: Payer: Self-pay | Admitting: *Deleted

## 2012-02-20 MED ORDER — AMIODARONE HCL 200 MG PO TABS: 100.0000 mg | ORAL_TABLET | Freq: Every day | ORAL | Status: DC

## 2012-03-06 ENCOUNTER — Telehealth: Payer: Self-pay | Admitting: Cardiovascular Disease

## 2012-03-06 NOTE — Telephone Encounter (Signed)
New Msg Pt is having eye surgery on [phone]. He needs to be off coumadin for 4 days prior. Please let him know if ok.

## 2012-03-06 NOTE — Telephone Encounter (Signed)
I spoke with the pt's wife and the pt is scheduled for Cataract Surgery on 03/20/12 with Dr Dagoberto Ligas.  The pt takes Coumadin and ASA. The pt needs the okay to hold coumadin 5 days prior to procedure and instruction to hold ASA if needed.  I will forward this message to Dr Excell Seltzer for review.

## 2012-03-06 NOTE — Telephone Encounter (Signed)
I spoke with the pt's wife and made her aware of Dr Cooper's recommendation.  

## 2012-03-06 NOTE — Telephone Encounter (Signed)
He is at low risk of holding Coumadin for a short period of time. I would recommend that he continue aspirin as it should be okay for cataract surgery.

## 2012-03-14 ENCOUNTER — Ambulatory Visit (INDEPENDENT_AMBULATORY_CARE_PROVIDER_SITE_OTHER): Payer: Medicare Other | Admitting: *Deleted

## 2012-03-14 DIAGNOSIS — I4891 Unspecified atrial fibrillation: Secondary | ICD-10-CM

## 2012-03-14 LAB — POCT INR: INR: 2

## 2012-04-04 ENCOUNTER — Ambulatory Visit (INDEPENDENT_AMBULATORY_CARE_PROVIDER_SITE_OTHER): Payer: Medicare Other | Admitting: Pharmacist

## 2012-04-04 DIAGNOSIS — I4891 Unspecified atrial fibrillation: Secondary | ICD-10-CM

## 2012-04-24 ENCOUNTER — Other Ambulatory Visit: Payer: Self-pay | Admitting: *Deleted

## 2012-04-24 MED ORDER — OLMESARTAN MEDOXOMIL 40 MG PO TABS: 40.0000 mg | ORAL_TABLET | Freq: Every day | ORAL | Status: DC

## 2012-04-24 NOTE — Telephone Encounter (Signed)
PATIENT AWARE OF SAMPLES TO BE PICKED UP AT FRONT DESK AND TO MAKE APPT. WITH MD

## 2012-05-16 ENCOUNTER — Ambulatory Visit (INDEPENDENT_AMBULATORY_CARE_PROVIDER_SITE_OTHER): Payer: Medicare Other | Admitting: *Deleted

## 2012-05-16 DIAGNOSIS — I4891 Unspecified atrial fibrillation: Secondary | ICD-10-CM

## 2012-05-16 LAB — POCT INR: INR: 2.2

## 2012-06-06 ENCOUNTER — Ambulatory Visit (INDEPENDENT_AMBULATORY_CARE_PROVIDER_SITE_OTHER): Payer: Medicare Other | Admitting: Internal Medicine

## 2012-06-06 ENCOUNTER — Encounter: Payer: Self-pay | Admitting: Internal Medicine

## 2012-06-06 VITALS — BP 120/60 | HR 65 | Temp 97.8°F | Ht 62.0 in | Wt 135.5 lb

## 2012-06-06 DIAGNOSIS — I10 Essential (primary) hypertension: Secondary | ICD-10-CM

## 2012-06-06 DIAGNOSIS — T148XXA Other injury of unspecified body region, initial encounter: Secondary | ICD-10-CM

## 2012-06-06 DIAGNOSIS — J029 Acute pharyngitis, unspecified: Secondary | ICD-10-CM

## 2012-06-06 MED ORDER — AZITHROMYCIN 250 MG PO TABS: ORAL_TABLET | ORAL | Status: AC

## 2012-06-06 NOTE — Patient Instructions (Addendum)
Take all new medications as prescribed Continue all other medications as before You are given the samples of the benicar 40 mg today

## 2012-06-07 ENCOUNTER — Encounter: Payer: Self-pay | Admitting: Internal Medicine

## 2012-06-07 DIAGNOSIS — T148XXA Other injury of unspecified body region, initial encounter: Secondary | ICD-10-CM | POA: Insufficient documentation

## 2012-06-07 DIAGNOSIS — J029 Acute pharyngitis, unspecified: Secondary | ICD-10-CM | POA: Insufficient documentation

## 2012-06-07 NOTE — Progress Notes (Signed)
Subjective:    Patient ID: Jesus [name], male    DOB: 1941-05-22, 71 y.o.   MRN: [id number]  HPI   Here with 3 days acute onset fever, severe ST,  Pressure, general weakness and malaise, but little to no cough and Pt denies chest pain, increased sob or doe, wheezing, orthopnea, PND, increased LE swelling, palpitations, dizziness or syncope.  Does have a large bruise to left arm more than usual in the past on the coumadin, but no other bruising, or other overt bleeding.   June 12 INR 2.2.  Pt denies new neurological symptoms such as new headache, or facial or extremity weakness or numbness   Pt denies polydipsia, polyuria.   Past Medical History  Diagnosis Date  . Esophageal reflux   . Atrial fibrillation     holding sinus rhythm on Amiodarone  . Aortic stenosis     mild with a mean aortic valve gradient of 12 mmHg  . CAD (coronary artery disease) 1997    CABG  . Unspecified essential hypertension   . History of transesophageal echocardiography (TEE) for monitoring   . Cardiomyopathy   . Other and unspecified hyperlipidemia   . Skin lesions, generalized     facial which may represent actinic keratoses and possible photosensitivity from Amiodarone  . GERD (gastroesophageal reflux disease)   . History of colonoscopy    Past Surgical History  Procedure Date  . Tonsillectomy   . Coronary artery bypass graft   . Cholecystectomy   . Cardioversion 11/18/2006    Dr. Jacklynn Bue    reports that he has never smoked. He has never used smokeless tobacco. He reports that he does not drink alcohol or use illicit drugs. family history includes Atrial fibrillation in his brother; Crohn's disease in his mother; Heart attack (age of onset:36) in his father; Heart disease in his father and paternal uncle; Heart failure in his father; Prostate cancer in his paternal uncle; and Stroke (age of onset:83) in his mother. Allergies  Allergen Reactions  . Codeine   . Iodine    Current Outpatient  Prescriptions on File Prior to Visit  Medication Sig Dispense Refill  . amiodarone (PACERONE) 200 MG tablet Take 0.5 tablets (100 mg total) by mouth daily.  30 tablet  6  . amLODipine (NORVASC) 5 MG tablet Take 1 tablet (5 mg total) by mouth daily.  30 tablet  10  . Ascorbic Acid (VITAMIN C) 1000 MG tablet Take 1,000 mg by mouth daily.        [name] aspirin 81 MG tablet Take 81 mg by mouth daily.        [name] atorvastatin (LIPITOR) 80 MG tablet Take 0.5 tablets (40 mg total) by mouth daily.  45 tablet  1  . loratadine (CLARITIN) 10 MG tablet Take 10 mg by mouth daily as needed.        . Multiple Vitamins-Minerals (MULTIVITAMIN,TX-MINERALS) tablet Take 1 tablet by mouth daily.        [name] olmesartan (BENICAR) 40 MG tablet Take 1 tablet (40 mg total) by mouth daily.  28 tablet  0  . pantoprazole (PROTONIX) 40 MG tablet Take 1 tablet (40 mg total) by mouth daily.  30 tablet  3  . warfarin (COUMADIN) 5 MG tablet Take 1 tablet (5 mg total) by mouth as directed.  60 tablet  2   Review of Systems Constitutional: Negative for diaphoresis and unexpected weight change.  HENT: Negative for tinnitus.   Eyes: Negative for photophobia and visual  disturbance.  Respiratory: Negative for choking and stridor.   Gastrointestinal: Negative for vomiting and blood in stool.  Genitourinary: Negative for hematuria and decreased urine volume.  Musculoskeletal: Negative for gait problem.  Psychiatric/Behavioral: Negative for decreased concentration. The patient is not hyperactive.      Objective:   Physical Exam BP 120/60  Pulse 65  Temp 97.8 F (36.6 C) (Oral)  Ht 5\' 2"  (1.575 m)  Wt 135 lb 8 oz (61.462 kg)  BMI 24.78 kg/m2  SpO2 96% Physical Exam  VS noted, mild ill appearing Constitutional: Pt appears well-developed and well-nourished.  HENT: Head: Normocephalic.  Right Ear: External ear normal.  Left Ear: External ear normal.  Bilat tm's mild erythema.  Sinus nontender.  Pharynx severe erythema with  exudate Eyes: Conjunctivae and EOM are normal. Pupils are equal, round, and reactive to light.  Neck: Normal range of motion. Neck supple.  Cardiovascular: Normal rate and regular rhythm.   Pulmonary/Chest: Effort normal and breath sounds normal.  Neurological: Pt is alert. Skin: Skin is warm. No erythema. 1.5 cm nontender bruised area post distal LUE  Psychiatric: Pt behavior is normal. Thought content normal.     Assessment & Plan:

## 2012-06-07 NOTE — Assessment & Plan Note (Signed)
Benign appaering, likely related to coumadin and minor trauma, pt reassured, no further eval or tx needed

## 2012-06-07 NOTE — Assessment & Plan Note (Signed)
Mild to mod, for antibx course,  to f/u any worsening symptoms or concerns 

## 2012-06-07 NOTE — Assessment & Plan Note (Signed)
stable overall by hx and exam, most recent data reviewed with pt, and pt to continue medical treatment as before BP Readings from Last 3 Encounters:  06/06/12 120/60  01/23/12 143/86  01/10/12 122/68

## 2012-06-20 ENCOUNTER — Other Ambulatory Visit: Payer: Self-pay | Admitting: *Deleted

## 2012-06-20 MED ORDER — PANTOPRAZOLE SODIUM 40 MG PO TBEC: 40.0000 mg | DELAYED_RELEASE_TABLET | Freq: Every day | ORAL | Status: DC

## 2012-06-27 ENCOUNTER — Ambulatory Visit (INDEPENDENT_AMBULATORY_CARE_PROVIDER_SITE_OTHER): Payer: Medicare Other | Admitting: *Deleted

## 2012-06-27 DIAGNOSIS — I4891 Unspecified atrial fibrillation: Secondary | ICD-10-CM

## 2012-07-09 ENCOUNTER — Ambulatory Visit (HOSPITAL_COMMUNITY): Payer: Medicare Other | Attending: Cardiology

## 2012-07-09 DIAGNOSIS — I2510 Atherosclerotic heart disease of native coronary artery without angina pectoris: Secondary | ICD-10-CM | POA: Insufficient documentation

## 2012-07-09 DIAGNOSIS — I517 Cardiomegaly: Secondary | ICD-10-CM | POA: Insufficient documentation

## 2012-07-09 DIAGNOSIS — I519 Heart disease, unspecified: Secondary | ICD-10-CM | POA: Insufficient documentation

## 2012-07-09 DIAGNOSIS — I359 Nonrheumatic aortic valve disorder, unspecified: Secondary | ICD-10-CM

## 2012-07-09 DIAGNOSIS — I10 Essential (primary) hypertension: Secondary | ICD-10-CM | POA: Insufficient documentation

## 2012-07-09 DIAGNOSIS — E785 Hyperlipidemia, unspecified: Secondary | ICD-10-CM | POA: Insufficient documentation

## 2012-07-09 DIAGNOSIS — I4891 Unspecified atrial fibrillation: Secondary | ICD-10-CM | POA: Insufficient documentation

## 2012-07-09 NOTE — Progress Notes (Signed)
Echocardiogram performed.  

## 2012-07-11 ENCOUNTER — Other Ambulatory Visit (INDEPENDENT_AMBULATORY_CARE_PROVIDER_SITE_OTHER): Payer: Medicare Other

## 2012-07-11 ENCOUNTER — Encounter: Payer: Self-pay | Admitting: Internal Medicine

## 2012-07-11 ENCOUNTER — Ambulatory Visit (INDEPENDENT_AMBULATORY_CARE_PROVIDER_SITE_OTHER): Payer: Medicare Other | Admitting: Internal Medicine

## 2012-07-11 VITALS — BP 122/60 | HR 58 | Temp 97.0°F | Resp 16 | Wt 133.0 lb

## 2012-07-11 DIAGNOSIS — I2589 Other forms of chronic ischemic heart disease: Secondary | ICD-10-CM

## 2012-07-11 DIAGNOSIS — E785 Hyperlipidemia, unspecified: Secondary | ICD-10-CM

## 2012-07-11 DIAGNOSIS — E0590 Thyrotoxicosis, unspecified without thyrotoxic crisis or storm: Secondary | ICD-10-CM

## 2012-07-11 DIAGNOSIS — I4891 Unspecified atrial fibrillation: Secondary | ICD-10-CM

## 2012-07-11 DIAGNOSIS — Z0000 Encounter for general adult medical examination without abnormal findings: Secondary | ICD-10-CM

## 2012-07-11 DIAGNOSIS — I2510 Atherosclerotic heart disease of native coronary artery without angina pectoris: Secondary | ICD-10-CM

## 2012-07-11 DIAGNOSIS — I359 Nonrheumatic aortic valve disorder, unspecified: Secondary | ICD-10-CM

## 2012-07-11 DIAGNOSIS — I10 Essential (primary) hypertension: Secondary | ICD-10-CM

## 2012-07-11 LAB — COMPREHENSIVE METABOLIC PANEL
ALT: 26 U/L (ref 0–53)
AST: 26 U/L (ref 0–37)
Albumin: 4.1 g/dL (ref 3.5–5.2)
CO2: 29 mEq/L (ref 19–32)
Calcium: 9.1 mg/dL (ref 8.4–10.5)
Chloride: 105 mEq/L (ref 96–112)
GFR: 105.13 mL/min (ref >60.00)
Potassium: 4.4 mEq/L (ref 3.5–5.1)
Sodium: 140 mEq/L (ref 135–145)
Total Protein: 7.1 g/dL (ref 6.0–8.3)

## 2012-07-11 LAB — HEPATIC FUNCTION PANEL
ALT: 26 U/L (ref 0–53)
Albumin: 4.1 g/dL (ref 3.5–5.2)
Bilirubin, Direct: 0.1 mg/dL (ref 0.0–0.3)
Total Protein: 7.1 g/dL (ref 6.0–8.3)

## 2012-07-11 LAB — LIPID PANEL
LDL Cholesterol: 79 mg/dL (ref 0–99)
Total CHOL/HDL Ratio: 3

## 2012-07-11 NOTE — Assessment & Plan Note (Signed)
Study Conclusions  - Left ventricle: The cavity size was normal. Wall thickness was increased in a pattern of mild LVH. Systolic function was moderately reduced. The estimated ejection fraction was in the range of 35% to 40%. Diffuse hypokinesis. - Aortic valve: The valve is calcified, There is severe aortic stenosis. Peak velocity: 407cm/s (S). Mean gradient: 38mm Hg (S). Peak gradient: 66mm Hg (S). - Mitral valve: Calcified annulus. - Left atrium: The atrium was moderately dilated. - Right ventricle: The cavity size was mildly dilated. Systolic function was mildly reduced. - Right atrium: The atrium was mildly dilated. - Impressions: The aortic stenosis may be a little worse this year. Impressions:  - The aortic stenosis may be a little worse this year.  He remains asymptomatic and is able to exercise and carry out all his ADLs. He denies any chest pain.

## 2012-07-11 NOTE — Progress Notes (Signed)
Subjective:    Patient ID: Jesus [name], male    DOB: 1941/03/26, 71 y.o.   MRN: [id number]  HPI The patient is here for annual Medicare wellness examination and management of other chronic and acute problems.  Had a 2 D echo Monday, August 5th: severe aortic stenosis with mild progression from last study. EF 40%. He is asymptomatic.  He is having continued pain in the wrists with carpal tunnel syndrome, relieved with cock-up wrist splints.   His audiologist reports that there is cerumen build up.    The risk factors are reflected in the social history.  The roster of all physicians providing medical care to patient - is listed in the Snapshot section of the chart.  Activities of daily living:  The patient is 100% inedpendent in all ADLs: dressing, toileting, feeding as well as independent mobility  Home safety : The patient has smoke detectors in the home. They wear seatbelts. Fall- home is fall safe.  firearms are present in the home, kept in a safe fashion. There is no violence in the home.   There is no risks for hepatitis, STDs or HIV. There is no   history of blood transfusion. They have no travel history to infectious disease endemic areas of the world.  The patient has seen their dentist in the last six month. They have  seen their eye doctor in the last year. They admit to hearing difficulty and have not had audiologic testing in the last year.  They do not  have excessive sun exposure. Discussed the need for sun protection: hats, long sleeves and use of sunscreen if there is significant sun exposure. Did have a recent "peel" by dermatologist.  Diet: the importance of a healthy diet is discussed. They do have a healthy diet.  The patient has a regular exercise program: gym and treadmill , 30 min duration, 3 per week.  The benefits of regular aerobic exercise were discussed.  Depression screen: there are no signs or vegative symptoms of depression- irritability, change in  appetite, anhedonia, sadness/tearfullness.  Cognitive assessment: the patient manages all their financial and personal affairs and is actively engaged.   The following portions of the patient's history were reviewed and updated as appropriate: allergies, current medications, past family history, past medical history,  past surgical history, past social history  and problem list.  Vision, hearing, body mass index were assessed and reviewed.   During the course of the visit the patient was educated and counseled about appropriate screening and preventive services including : fall prevention , diabetes screening, nutrition counseling, colorectal cancer screening, and recommended immunizations.  Past Medical History  Diagnosis Date  . Esophageal reflux   . Atrial fibrillation     holding sinus rhythm on Amiodarone  . Aortic stenosis     mild with a mean aortic valve gradient of 12 mmHg  . CAD (coronary artery disease) 1997    CABG  . Unspecified essential hypertension   . History of transesophageal echocardiography (TEE) for monitoring   . Cardiomyopathy   . Other and unspecified hyperlipidemia   . Skin lesions, generalized     facial which may represent actinic keratoses and possible photosensitivity from Amiodarone  . GERD (gastroesophageal reflux disease)   . History of colonoscopy    Past Surgical History  Procedure Date  . Tonsillectomy   . Coronary artery bypass graft   . Cholecystectomy   . Cardioversion 11/18/2006    Dr. Jacklynn Bue  . Cataract extraction  w/ intraocular lens implant April '13  (Dr. Dagoberto Ligas)    left eye only   Family History  Problem Relation Age of Onset  . Stroke Mother 85    Deceased  . Heart attack Father 71    Deceased  . Atrial fibrillation Brother   . Crohn's disease Mother     Deceased  . Heart disease Father   . Heart disease Paternal Uncle   . Prostate cancer Paternal Uncle   . Heart failure Father     Deceased   History   Social  History  . Marital Status: Married    Spouse Name: N/A    Number of Children: N/A  . Years of Education: N/A   Occupational History  . Not on file.   Social History Main Topics  . Smoking status: Never Smoker   . Smokeless tobacco: Never Used   Comment: Does not smoke.  [name] Alcohol Use: No  . Drug Use: No  . Sexually Active: Not Currently   Other Topics Concern  . Not on file   Social History Narrative   HSG. [employer] - [employer] 6 years. Married - '69 - 1 year/divorced. '76 - . No children. Work - mfg/textiles - Audiological scientist; currently works doing maintenance. [employer] - they have discussed this. Provided packet august '13.     Current Outpatient Prescriptions on File Prior to Visit  Medication Sig Dispense Refill  . amiodarone (PACERONE) 200 MG tablet Take 0.5 tablets (100 mg total) by mouth daily.  30 tablet  6  . amLODipine (NORVASC) 5 MG tablet Take 1 tablet (5 mg total) by mouth daily.  30 tablet  10  . Ascorbic Acid (VITAMIN C) 1000 MG tablet Take 1,000 mg by mouth daily.        [name] aspirin 81 MG tablet Take 81 mg by mouth daily.        [name] atorvastatin (LIPITOR) 80 MG tablet Take 0.5 tablets (40 mg total) by mouth daily.  45 tablet  1  . loratadine (CLARITIN) 10 MG tablet Take 10 mg by mouth daily as needed.        . Multiple Vitamins-Minerals (MULTIVITAMIN,TX-MINERALS) tablet Take 1 tablet by mouth daily.        [name] olmesartan (BENICAR) 40 MG tablet Take 1 tablet (40 mg total) by mouth daily.  28 tablet  0  . pantoprazole (PROTONIX) 40 MG tablet Take 1 tablet (40 mg total) by mouth daily.  30 tablet  3  . warfarin (COUMADIN) 5 MG tablet Take 1 tablet (5 mg total) by mouth as directed.  60 tablet  2      Review of Systems Constitutional:  Negative for fever, chills, activity change and unexpected weight change.  HEENT:  Negative for hearing loss, ear pain, congestion, neck stiffness and postnasal drip. Negative for sore throat or swallowing problems. Negative for dental  complaints.   Eyes: Negative for vision loss or change in visual acuity.  Respiratory: Negative for chest tightness and wheezing. Negative for DOE.   Cardiovascular: Negative for chest pain or palpitations. No decreased exercise tolerance Gastrointestinal: No change in bowel habit. No bloating or gas. No reflux or indigestion Genitourinary: Negative for urgency, frequency, flank pain and difficulty urinating. Nocturia x 2 Musculoskeletal: Negative for myalgias, back pain, arthralgias and gait problem.  Neurological: Negative for dizziness, tremors, weakness and headaches.  Hematological: Negative for adenopathy.  Psychiatric/Behavioral: Negative for behavioral problems and dysphoric mood.       Objective:  Physical Exam Filed Vitals:   07/11/12 0916  BP: 122/60  Pulse: 58  Temp: 97 F (36.1 C)  Resp: 16   Wt Readings from Last 3 Encounters:  07/11/12 133 lb (60.328 kg)  06/06/12 135 lb 8 oz (61.462 kg)  01/23/12 134 lb (60.782 kg)   Gen'l: Well nourished well developed white male in no acute distress  HEENT: Head: Normocephalic and atraumatic. Right Ear: External ear normal. EAC w/ cerumen impaction/TM nl. Left Ear: External ear normal.  EAC w/ cerumen impaction/TM nl. Nose: Nose normal. Mouth/Throat: Oropharynx is clear and moist. Dentition - native, in good repair. No buccal or palatal lesions. Posterior pharynx clear. Eyes: Conjunctivae and sclera clear. EOM intact. Pupils are equal, round, and reactive to light - cannot tell that he had cataract extraction w/ IOL. Right eye exhibits no discharge. Left eye exhibits no discharge. Neck: Normal range of motion. Neck supple. No JVD present. No tracheal deviation present. No thyromegaly present.  Cardiovascular: Normal rate, regular rhythm, no gallop, no friction rub, no murmur heard.      Quiet precordium. 2+ radial and DP pulses . No carotid bruits Pulmonary/Chest: Effort normal. No respiratory distress or increased WOB, no wheezes,  no rales. No chest wall deformity or CVAT. Abdomen: Soft. Bowel sounds are normal in all quadrants. He exhibits no distension, no tenderness, no rebound or guarding, No heptosplenomegaly  Genitourinary:  deferred to age.  Musculoskeletal: Normal range of motion. He exhibits no edema and no tenderness.       Small and large joints without redness, synovial thickening or deformity. Full range of motion preserved about all small, median and large joints.  Lymphadenopathy:    He has no cervical or supraclavicular adenopathy.  Neurological: He is alert and oriented to person, place, and time. CN II-XII intact. DTRs 2+ and symmetrical biceps, radial and patellar tendons. Cerebellar function normal with no tremor, rigidity, normal gait and station.  Skin: Skin is warm and dry. No rash noted. No erythema.  Psychiatric: He has a normal mood and affect. His behavior is normal. Thought content normal.  Lab Results  Component Value Date   WBC 6.7 01/02/2012   HGB 13.5 01/02/2012   HCT 40.5 01/02/2012   PLT 131.0* 01/02/2012   GLUCOSE 94 07/11/2012   CHOL 154 07/11/2012   TRIG 92.0 07/11/2012   HDL 56.40 07/11/2012   LDLCALC 79 07/11/2012        ALT 26 07/11/2012   AST 26 07/11/2012        NA 140 07/11/2012   K 4.4 07/11/2012   CL 105 07/11/2012   CREATININE 0.8 07/11/2012   BUN 11 07/11/2012   CO2 29 07/11/2012   TSH 2.61 07/11/2012   INR 2.2 06/27/2012          Assessment & Plan:

## 2012-07-11 NOTE — Assessment & Plan Note (Signed)
Stable EF at approx 40%

## 2012-07-11 NOTE — Assessment & Plan Note (Signed)
Stable with no c/o chest pain.

## 2012-07-11 NOTE — Assessment & Plan Note (Signed)
Excellent rate control vs NSR.  Plan - no change in regimen

## 2012-07-11 NOTE — Assessment & Plan Note (Signed)
BP Readings from Last 3 Encounters:  07/11/12 122/60  06/06/12 120/60  01/23/12 143/86   Good control on present medication.

## 2012-07-11 NOTE — Assessment & Plan Note (Addendum)
For lab today with recommendations to follow. He has previously been well controlled.  Addendum- lab reveals excellent control. Liver functions are normal.  P:lan No change in medications

## 2012-07-12 DIAGNOSIS — Z0000 Encounter for general adult medical examination without abnormal findings: Secondary | ICD-10-CM | POA: Insufficient documentation

## 2012-07-12 NOTE — Assessment & Plan Note (Signed)
Lab Results  Component Value Date   TSH 2.61 07/11/2012   Good control on present medication - no change in medical regimen

## 2012-07-12 NOTE — Assessment & Plan Note (Signed)
Interval history is unremarkable - 2D echo w/o significant change. Physical exam is normal. Lab results in normal range. He is current with colorectal cancer screening and has aged out of prostate cancer screening. Immunizations are up to date. He is exercising on a regular basis and looks trim.  In summary - a very nice man who appears to be medically stable. He will return for general follow-up in 6 months, sooner as needed.

## 2012-07-25 ENCOUNTER — Encounter: Payer: Self-pay | Admitting: *Deleted

## 2012-07-25 ENCOUNTER — Ambulatory Visit (INDEPENDENT_AMBULATORY_CARE_PROVIDER_SITE_OTHER): Payer: Medicare Other | Admitting: *Deleted

## 2012-07-25 ENCOUNTER — Encounter: Payer: Self-pay | Admitting: Cardiovascular Disease

## 2012-07-25 ENCOUNTER — Ambulatory Visit (INDEPENDENT_AMBULATORY_CARE_PROVIDER_SITE_OTHER): Payer: Medicare Other | Admitting: Cardiovascular Disease

## 2012-07-25 VITALS — BP 125/64 | HR 57 | Ht 62.0 in | Wt 135.0 lb

## 2012-07-25 DIAGNOSIS — I359 Nonrheumatic aortic valve disorder, unspecified: Secondary | ICD-10-CM

## 2012-07-25 DIAGNOSIS — Z7901 Long term (current) use of anticoagulants: Secondary | ICD-10-CM

## 2012-07-25 DIAGNOSIS — I4891 Unspecified atrial fibrillation: Secondary | ICD-10-CM

## 2012-07-25 DIAGNOSIS — I10 Essential (primary) hypertension: Secondary | ICD-10-CM

## 2012-07-25 DIAGNOSIS — E785 Hyperlipidemia, unspecified: Secondary | ICD-10-CM

## 2012-07-25 DIAGNOSIS — I2510 Atherosclerotic heart disease of native coronary artery without angina pectoris: Secondary | ICD-10-CM

## 2012-07-25 DIAGNOSIS — I350 Nonrheumatic aortic (valve) stenosis: Secondary | ICD-10-CM

## 2012-07-25 LAB — CBC WITH DIFFERENTIAL/PLATELET
Basophils Relative: 0.3 % (ref 0.0–3.0)
Hemoglobin: 12.4 g/dL — ABNORMAL LOW (ref 13.0–17.0)
Lymphocytes Relative: 25.2 % (ref 12.0–46.0)
Monocytes Relative: 10 % (ref 3.0–12.0)
Neutro Abs: 4.1 10*3/uL (ref 1.4–7.7)
RBC: 3.77 Mil/uL — ABNORMAL LOW (ref 4.22–5.81)

## 2012-07-25 LAB — BASIC METABOLIC PANEL
BUN: 17 mg/dL (ref 6–23)
CO2: 27 mEq/L (ref 19–32)
Chloride: 105 mEq/L (ref 96–112)
Creatinine, Ser: 0.9 mg/dL (ref 0.4–1.5)

## 2012-07-25 LAB — POCT INR: INR: 2.4

## 2012-07-25 NOTE — Assessment & Plan Note (Signed)
Well-controlled on current medical program. 

## 2012-07-25 NOTE — Patient Instructions (Addendum)
Pre-OP Labs today.  Your physician has requested that you have a cardiac catheterization. Cardiac catheterization is used to diagnose and/or treat various heart conditions. Doctors may recommend this procedure for a number of different reasons. The most common reason is to evaluate chest pain. Chest pain can be a symptom of coronary artery disease (CAD), and cardiac catheterization can show whether plaque is narrowing or blocking your heart's arteries. This procedure is also used to evaluate the valves, as well as measure the blood flow and oxygen levels in different parts of your heart. For further information please visit [email or web address] Please follow instruction sheet, as given.

## 2012-07-25 NOTE — Progress Notes (Signed)
HPI:  71 year old gentleman presenting for followup evaluation. 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 His bypass surgery was over 20 years ago. The patient's left ventricular ejection fraction has been in range of 40%. His last nuclear stress test was about 6 months ago and it showed a large, severe fixed defect in the inferoposterior wall. There was no ischemia. He had good exercise tolerance as he did about 10.5 minutes on the Bruce protocol. He has developed severe aortic stenosis as well.  The patient denies chest pain or pressure. He remains physically active. His wife notes that he seemed less tolerant to physical exertion and he now has to take several rest breaks when he mows the lawn. The patient denies dyspnea, orthopnea, PND, lightheadedness, or syncope.  Outpatient Encounter Prescriptions as of 07/25/2012  Medication Sig Dispense Refill  . amiodarone (PACERONE) 200 MG tablet Take 0.5 tablets (100 mg total) by mouth daily.  30 tablet  6  . amLODipine (NORVASC) 5 MG tablet Take 1 tablet (5 mg total) by mouth daily.  30 tablet  10  . Ascorbic Acid (VITAMIN C) 1000 MG tablet Take 1,000 mg by mouth daily.        [name] aspirin 81 MG tablet Take 81 mg by mouth daily.        [name] atorvastatin (LIPITOR) 80 MG tablet Take 0.5 tablets (40 mg total) by mouth daily.  45 tablet  1  . loratadine (CLARITIN) 10 MG tablet Take 10 mg by mouth daily as needed.        . Multiple Vitamins-Minerals (MULTIVITAMIN,TX-MINERALS) tablet Take 1 tablet by mouth daily.        [name] olmesartan (BENICAR) 40 MG tablet Take 1 tablet (40 mg total) by mouth daily.  28 tablet  0  . pantoprazole (PROTONIX) 40 MG tablet Take 1 tablet (40 mg total) by mouth daily.  30 tablet  3  . warfarin (COUMADIN) 5 MG tablet Take 1 tablet (5 mg total) by mouth as directed.  60 tablet  2    Allergies  Allergen Reactions  . Codeine   . Iodine      Past Medical History  Diagnosis Date  . Esophageal reflux   . Atrial fibrillation     holding sinus rhythm on Amiodarone  . Aortic stenosis     mild with a mean aortic valve gradient of 12 mmHg  . CAD (coronary artery disease) 1997    CABG  . Unspecified essential hypertension   . History of transesophageal echocardiography (TEE) for monitoring   . Cardiomyopathy   . Other and unspecified hyperlipidemia   . Skin lesions, generalized     facial which may represent actinic keratoses and possible photosensitivity from Amiodarone  . GERD (gastroesophageal reflux disease)   . History of colonoscopy     ROS: Negative except as per HPI  BP 125/64  Pulse 57  Ht 5\' 2"  (1.575 m)  Wt 61.236 kg (135 lb)  BMI 24.69 kg/m2  PHYSICAL EXAM: Pt is alert and oriented, NAD HEENT: normal Neck: JVP - normal, carotids 2+= without bruits Lungs: CTA bilaterally CV: RRR grade 3/6 systolic crescendo decrescendo murmur with an absent a 2. Murmur is late peaking. Abd: soft, NT, Positive BS, no hepatomegaly Ext: no C/C/E, distal pulses intact and equal Skin: warm/dry no rash  EKG:  Sinus bradycardia 57 beats per minute, inferior infarction age undetermined.  ECHO: Study Conclusions  - Left  ventricle: The cavity size was normal. Wall thickness was increased in a pattern of mild LVH. Systolic function was moderately reduced. The estimated ejection fraction was in the range of 35% to 40%. Diffuse hypokinesis. - Aortic valve: The valve is calcified, There is severe aortic stenosis. Peak velocity: 407cm/s (S). Mean gradient: 38mm Hg (S). Peak gradient: 66mm Hg (S). - Mitral valve: Calcified annulus. - Left atrium: The atrium was moderately dilated. - Right ventricle: The cavity size was mildly dilated. Systolic function was mildly reduced. - Right atrium: The atrium was mildly dilated. - Impressions: The aortic stenosis may be a little worse this year.  ASSESSMENT AND PLAN:

## 2012-07-25 NOTE — Assessment & Plan Note (Signed)
The patient's echocardiographic images were personally reviewed. 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 He has LV dysfunction, but this is unrelated to his aortic stenosis as his LV function has been stable over several years. The patient's aortic stenosis is progressing, and despite his fairly minimal symptoms, this may be the best time to intervene. He is at high risk of adverse outcomes with medical therapy in the setting of moderate LV dysfunction and severe aortic stenosis. I have reviewed this in detail with the patient and his wife. The next step in evaluation would involve a right and left heart catheterization. I have reviewed the risks, indications, and alternatives to cardiac catheterization with the patient in detail. He understands and agrees to proceed. We had a long discussion about the natural history of aortic stenosis, prognostic issues, and potential treatment options. I reviewed with them that discussion of treatment options will be much easier after we define his coronary artery and bypass graft status. I will also do a careful hemodynamic study when he has his cardiac catheterization.

## 2012-07-25 NOTE — Assessment & Plan Note (Signed)
Lipids are due to narrative goal on atorvastatin.

## 2012-07-25 NOTE — Assessment & Plan Note (Signed)
Stable without anginal symptoms. Large fixed defect in the inferoposterior wall consistent with his previous myocardial infarction.  See below for discussion of his aortic stenosis.

## 2012-07-25 NOTE — Assessment & Plan Note (Signed)
The patient is maintaining sinus rhythm on amiodarone. He is anticoagulated with warfarin. This will need to be held around the time of his cardiac catheterization. We'll continue current medical management. Amiodarone surveillance labs were recently done with a TSH and liver function tests and these were all within normal limits.

## 2012-08-01 ENCOUNTER — Other Ambulatory Visit (INDEPENDENT_AMBULATORY_CARE_PROVIDER_SITE_OTHER): Payer: Medicare Other

## 2012-08-01 ENCOUNTER — Other Ambulatory Visit: Payer: Medicare Other

## 2012-08-01 ENCOUNTER — Telehealth: Payer: Self-pay | Admitting: *Deleted

## 2012-08-01 DIAGNOSIS — I4891 Unspecified atrial fibrillation: Secondary | ICD-10-CM

## 2012-08-01 DIAGNOSIS — Z01812 Encounter for preprocedural laboratory examination: Secondary | ICD-10-CM

## 2012-08-01 DIAGNOSIS — I2510 Atherosclerotic heart disease of native coronary artery without angina pectoris: Secondary | ICD-10-CM

## 2012-08-01 LAB — PROTIME-INR: INR: 1.1 ratio — ABNORMAL HIGH (ref 0.8–1.0)

## 2012-08-01 NOTE — Telephone Encounter (Signed)
Kim from the [facility] calls for cath orders and recheck Coumadin level Dr. Excell Seltzer aware. Pt called and will come in today to the [facility] for stat INR Order placed  Mylo Red RN

## 2012-08-02 ENCOUNTER — Encounter (HOSPITAL_BASED_OUTPATIENT_CLINIC_OR_DEPARTMENT_OTHER)
Admission: RE | Disposition: A | Discharge status: Home / Self Care | Payer: Self-pay | Source: Ambulatory Visit | Attending: Cardiovascular Disease

## 2012-08-02 ENCOUNTER — Inpatient Hospital Stay (HOSPITAL_BASED_OUTPATIENT_CLINIC_OR_DEPARTMENT_OTHER)
Admission: RE | Admit: 2012-08-02 | Discharge: 2012-08-02 | Disposition: A | Discharge status: Home / Self Care | Payer: Medicare Other | Source: Ambulatory Visit | Attending: Cardiovascular Disease | Admitting: Cardiovascular Disease

## 2012-08-02 DIAGNOSIS — I359 Nonrheumatic aortic valve disorder, unspecified: Secondary | ICD-10-CM | POA: Insufficient documentation

## 2012-08-02 DIAGNOSIS — I519 Heart disease, unspecified: Secondary | ICD-10-CM | POA: Insufficient documentation

## 2012-08-02 DIAGNOSIS — I2582 Chronic total occlusion of coronary artery: Secondary | ICD-10-CM | POA: Insufficient documentation

## 2012-08-02 DIAGNOSIS — Z951 Presence of aortocoronary bypass graft: Secondary | ICD-10-CM | POA: Insufficient documentation

## 2012-08-02 DIAGNOSIS — I2510 Atherosclerotic heart disease of native coronary artery without angina pectoris: Secondary | ICD-10-CM

## 2012-08-02 LAB — POCT I-STAT 3, ART BLOOD GAS (G3+)
Bicarbonate: 24 mEq/L (ref 20.0–24.0)
pH, Arterial: 7.37 (ref 7.350–7.450)
pO2, Arterial: 70 mmHg — ABNORMAL LOW (ref 80.0–100.0)

## 2012-08-02 LAB — POCT I-STAT 3, VENOUS BLOOD GAS (G3P V)
Bicarbonate: 25.2 mEq/L — ABNORMAL HIGH (ref 20.0–24.0)
O2 Saturation: 70 %
TCO2: 27 mmol/L (ref 0–100)
pCO2, Ven: 44.5 mmHg — ABNORMAL LOW (ref 45.0–50.0)

## 2012-08-02 SURGERY — JV LEFT AND RIGHT HEART CATHETERIZATION WITH CORONARY ANGIOGRAM
Anesthesia: Moderate Sedation

## 2012-08-02 MED ORDER — ACETAMINOPHEN 325 MG PO TABS: 650.0000 mg | ORAL_TABLET | ORAL | Status: DC | PRN

## 2012-08-02 MED ORDER — SODIUM CHLORIDE 0.9 % IV SOLN: 1.0000 mL/kg/h | INTRAVENOUS | Status: DC

## 2012-08-02 MED ORDER — ONDANSETRON HCL 4 MG/2ML IJ SOLN: 4.0000 mg | Freq: Four times a day (QID) | INTRAMUSCULAR | Status: DC | PRN

## 2012-08-02 MED ORDER — SODIUM CHLORIDE 0.9 % IV SOLN
INTRAVENOUS | Status: DC
  Administered 2012-08-02: 08:00:00 via INTRAVENOUS

## 2012-08-02 NOTE — H&P (View-Only) (Signed)
 HPI:  71-year-old gentleman presenting for followup evaluation. 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 His bypass surgery was over 20 years ago. The patient's left ventricular ejection fraction has been in range of 40%. His last nuclear stress test was about 6 months ago and it showed a large, severe fixed defect in the inferoposterior wall. There was no ischemia. He had good exercise tolerance as he did about 10.5 minutes on the Bruce protocol. He has developed severe aortic stenosis as well.  The patient denies chest pain or pressure. He remains physically active. His wife notes that he seemed less tolerant to physical exertion and he now has to take several rest breaks when he mows the lawn. The patient denies dyspnea, orthopnea, PND, lightheadedness, or syncope.  Outpatient Encounter Prescriptions as of 07/25/2012  Medication Sig Dispense Refill  . amiodarone (PACERONE) 200 MG tablet Take 0.5 tablets (100 mg total) by mouth daily.  30 tablet  6  . amLODipine (NORVASC) 5 MG tablet Take 1 tablet (5 mg total) by mouth daily.  30 tablet  10  . Ascorbic Acid (VITAMIN C) 1000 MG tablet Take 1,000 mg by mouth daily.        . aspirin 81 MG tablet Take 81 mg by mouth daily.        . atorvastatin (LIPITOR) 80 MG tablet Take 0.5 tablets (40 mg total) by mouth daily.  45 tablet  1  . loratadine (CLARITIN) 10 MG tablet Take 10 mg by mouth daily as needed.        . Multiple Vitamins-Minerals (MULTIVITAMIN,TX-MINERALS) tablet Take 1 tablet by mouth daily.        . olmesartan (BENICAR) 40 MG tablet Take 1 tablet (40 mg total) by mouth daily.  28 tablet  0  . pantoprazole (PROTONIX) 40 MG tablet Take 1 tablet (40 mg total) by mouth daily.  30 tablet  3  . warfarin (COUMADIN) 5 MG tablet Take 1 tablet (5 mg total) by mouth as directed.  60 tablet  2    Allergies  Allergen Reactions  . Codeine   . Iodine      Past Medical History  Diagnosis Date  . Esophageal reflux   . Atrial fibrillation     holding sinus rhythm on Amiodarone  . Aortic stenosis     mild with a mean aortic valve gradient of 12 mmHg  . CAD (coronary artery disease) 1997    CABG  . Unspecified essential hypertension   . History of transesophageal echocardiography (TEE) for monitoring   . Cardiomyopathy   . Other and unspecified hyperlipidemia   . Skin lesions, generalized     facial which may represent actinic keratoses and possible photosensitivity from Amiodarone  . GERD (gastroesophageal reflux disease)   . History of colonoscopy     ROS: Negative except as per HPI  BP 125/64  Pulse 57  Ht 5' 2" (1.575 m)  Wt 61.236 kg (135 lb)  BMI 24.69 kg/m2  PHYSICAL EXAM: Pt is alert and oriented, NAD HEENT: normal Neck: JVP - normal, carotids 2+= without bruits Lungs: CTA bilaterally CV: RRR grade 3/6 systolic crescendo decrescendo murmur with an absent a 2. Murmur is late peaking. Abd: soft, NT, Positive BS, no hepatomegaly Ext: no C/C/E, distal pulses intact and equal Skin: warm/dry no rash  EKG:  Sinus bradycardia 57 beats per minute, inferior infarction age undetermined.  ECHO: Study Conclusions  - Left   ventricle: The cavity size was normal. Wall thickness was increased in a pattern of mild LVH. Systolic function was moderately reduced. The estimated ejection fraction was in the range of 35% to 40%. Diffuse hypokinesis. - Aortic valve: The valve is calcified, There is severe aortic stenosis. Peak velocity: 407cm/s (S). Mean gradient: 38mm Hg (S). Peak gradient: 66mm Hg (S). - Mitral valve: Calcified annulus. - Left atrium: The atrium was moderately dilated. - Right ventricle: The cavity size was mildly dilated. Systolic function was mildly reduced. - Right atrium: The atrium was mildly dilated. - Impressions: The aortic stenosis may be a little worse this year.  ASSESSMENT AND PLAN:  

## 2012-08-02 NOTE — Progress Notes (Signed)
Arterial sheath removed @ 0935 and venous sheath removed @ 0954 by Greggory Stallion.  Dr. Excell Seltzer in to discuss results with patient family.

## 2012-08-02 NOTE — CV Procedure (Signed)
Cardiac Catheterization Procedure Note  Name: Jesus [name] MRN: [id number] DOB: September 10, 1941  Procedure: Right Heart Cath, Left Heart Cath, Selective Coronary Angiography, LV angiography  Indication: Severe aortic stenosis. This 71 year old gentleman with remote coronary bypass surgery has developed progressive aortic stenosis, now in the severe range. He presents for right and left heart catheterization for further evaluation.   Procedural Details: The right groin was prepped, draped, and anesthetized with 1% lidocaine. Using the modified Seldinger technique a 6 French sheath was placed in the right femoral artery and a 6 French sheath was placed in the right femoral vein. A multipurpose catheter was used for the right heart catheterization. Standard protocol was followed for recording of right heart pressures and sampling of oxygen saturations. Fick cardiac output was calculated. Standard Judkins catheters were used for selective coronary angiography and left ventriculography. An AL-1 catheter and straight-tip wire were used to cross the aortic valve. A Langston catheter was used to record simultaneous LV and AO pressure. There were no immediate procedural complications. The patient was transferred to the post catheterization recovery area for further monitoring.  Procedural Findings: Hemodynamics RA 3 RV 21/5 PA 23/6 with a mean of 12 PCWP A wave 7 V wave 5 mean of 5 LV 137/16 AO 100/50 with a mean of 71  Oxygen saturations: PA 70 AO 93  Cardiac Output (Fick) 5.2 Cardiac Index (Fick) 3.2  Aortic valve hemodynamics: Mean gradient 44, valve area 0.94 square centimeters   Coronary angiography: Coronary dominance: right  Left mainstem: The left mainstem is densely calcified. There is 90% distal left main stenosis the  Left anterior descending (LAD): The LAD is densely calcified. The vessel is totally occluded at the first septal perforator.  Left circumflex (LCx): The left  circumflex is densely calcified. The vessel is totally occluded prior to the first obtuse marginal.  Right coronary artery (RCA): The right coronary artery is heavily calcified. The vessel is totally occluded at its origin.  Saphenous vein graft sequence to acute marginals 1 and 2 in the right PDA is patent. There are some irregularities but no significant stenoses throughout the body of the vein graft. The acute marginal branches are patent. The PDA is patent. The posterior AV segment is severely diseased but several posterolateral branches fill from this graft to  Saphenous vein graft sequence to OM1, OM 2, and ON 3 is patent. The body of the graft has no significant disease. The proximal OM branches are tiny. The major branches the OM 3 which is patent. There is a high-grade 90% stenosis in the midportion of that obtuse marginal target vessel. Beyond the area of severe stenosis the vessel divides into twin branches.  LIMA to LAD: This graft was imaged subselectively from the left subclavian because of severe subclavian tortuosity. The LIMA is widely patent. The LAD and diagonal branches remain patent and the anastomotic site appears widely patent.  Left ventriculography: Left ventricular function is severely depressed. There is dense akinesis of the basal and midinferior walls. There is hypokinesis of anterolateral wall and apex. The left ventricular ejection fraction is estimated at 35%. There is no significant mitral regurgitation.  Aortic valve: The aortic valve is severely calcified. There is restricted opening of the aortic valve leaflets which is well seen on plain fluoroscopy.  Final Conclusions:   1. Severe native three-vessel coronary artery disease with total occlusion of all major epicardial coronary vessels 2. Status post aortocoronary bypass surgery with continued patency of all grafts. Diffuse small  vessel disease as described, primarily in the posterolateral branches and third  obtuse marginal branch  3. Severe calcific aortic stenosis with a mean gradient of 44 mm mercury and aortic valve area of 0.9 4. Severe segmental left ventricular dysfunction with an estimated LVEF of 35%  Recommendations: [name] has severe aortic stenosis with at least moderately severe LV dysfunction. Despite his low ejection fraction, his cardiac output is well maintained based on Fick measurements. He is minimally symptomatic. The major question is whether to proceed with aortic valve replacement at this time considering his LV dysfunction and slowly progressive severe aortic stenosis. I am going to refer him for a cardiac surgery evaluation.  Jesus [name] 08/02/2012, 9:30 AM

## 2012-08-02 NOTE — Interval H&P Note (Signed)
History and Physical Interval Note:  08/02/2012 8:47 AM  Jesus [name]  has presented today for surgery, with the diagnosis of as  The various methods of treatment have been discussed with the patient and family. After consideration of risks, benefits and other options for treatment, the patient has consented to  Procedure(s) (LRB): JV LEFT AND RIGHT HEART CATHETERIZATION WITH CORONARY ANGIOGRAM (N/A) as a surgical intervention .  The patient's history has been reviewed, patient examined, no change in status, stable for surgery.  I have reviewed the patient's chart and labs.  Questions were answered to the patient's satisfaction.     [name]

## 2012-08-08 ENCOUNTER — Encounter: Payer: Self-pay | Admitting: Pharmacist

## 2012-08-10 ENCOUNTER — Institutional Professional Consult (permissible substitution) (INDEPENDENT_AMBULATORY_CARE_PROVIDER_SITE_OTHER): Payer: Medicare Other | Admitting: Thoracic Surgery (Cardiothoracic Vascular Surgery)

## 2012-08-10 ENCOUNTER — Encounter: Payer: Self-pay | Admitting: Thoracic Surgery (Cardiothoracic Vascular Surgery)

## 2012-08-10 VITALS — BP 128/74 | HR 61 | Resp 18 | Ht 63.0 in | Wt 135.0 lb

## 2012-08-10 DIAGNOSIS — I350 Nonrheumatic aortic (valve) stenosis: Secondary | ICD-10-CM

## 2012-08-10 DIAGNOSIS — I2510 Atherosclerotic heart disease of native coronary artery without angina pectoris: Secondary | ICD-10-CM | POA: Insufficient documentation

## 2012-08-10 DIAGNOSIS — I359 Nonrheumatic aortic valve disorder, unspecified: Secondary | ICD-10-CM

## 2012-08-10 DIAGNOSIS — Z951 Presence of aortocoronary bypass graft: Secondary | ICD-10-CM

## 2012-08-10 DIAGNOSIS — I25810 Atherosclerosis of coronary artery bypass graft(s) without angina pectoris: Secondary | ICD-10-CM | POA: Insufficient documentation

## 2012-08-10 DIAGNOSIS — I519 Heart disease, unspecified: Secondary | ICD-10-CM | POA: Insufficient documentation

## 2012-08-10 HISTORY — DX: Nonrheumatic aortic (valve) stenosis: I35.0

## 2012-08-10 NOTE — Progress Notes (Signed)
[address]            [name] [address]          [phone]     CARDIOTHORACIC SURGERY CONSULTATION REPORT  Referring Provider is [name], MD PCP is Illene Regulus, MD  Chief Complaint  Patient presents with  . 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 He later underwent coronary artery bypass grafting x8 by Dr. Andrey Campanile in 1997. Grafts placed at the time of surgery included left internal mammary artery sewn sequentially to the diagonal branch and to left anterior descending coronary artery, saphenous vein graft sewn sequentially to the OM1, OM2, and OM3 branches of the left circumflex coronary artery, and saphenous vein graft sewn sequentially to the AM1, AM2, and posterior descending coronary artery of the right coronary circulation.  The patient has done well since then maintaining stable baseline left ventricular ejection fraction approximately 40%. He underwent a stress Myoview exam in February 2013 documenting large fixed defect in the inferior posterior wall consistent with the patient's old myocardial infarction. There was no ischemia and the patient had remarkably good exercise tolerance as he exercised 10.5 minutes on a standard Bruce protocol without developing any symptoms or other signs of ischemia.  The patient has also developed aortic stenosis which was initially discovered on routine followup echocardiograms. Severity of aortic stenosis has progressed to the point where the most recent echocardiogram performed last month demonstrated a peak velocity  across the aortic valve of 407 cm/sec with peak and mean transvalvular gradients estimated to be 66 and 38 mmHg, respectively.  The patient subsequently underwent left and right heart catheterization last week. This confirmed the presence of severe aortic stenosis with mean gradient across the valve at catheterization measured 44 mm mercury. Aortic valve area was estimated 0.9 cm. There was progression of the patient's native coronary artery disease. All of the previous bypass grafts remained patent. The patient has been referred to consider possible surgical intervention.  From a symptomatic standpoint the patient reports very few if any symptoms. He goes to the gym every morning and walks at a brisk pace on a treadmill and performs other exercises as well. He states that he may get tired a little bit more easily than he used to, but overall his exercise tolerance seems to be relatively well-preserved. He states that he occasionally will have a brief episode of dull chest discomfort which radiates across his left chest this only occurs if he really pushes himself physically doing something fairly strenuous.  This does not occur with typical daily activity and it did not occur during his stress class last February. He denies any resting chest pain or shortness of breath. He denies any PND, orthopnea, or lower extremity edema. He has not had any dizzy spells nor syncope. He denies any history of palpitations.   Past Medical History  Diagnosis Date  . Esophageal reflux   . Atrial fibrillation     holding sinus rhythm on Amiodarone  . Aortic stenosis  mild with a mean aortic valve gradient of 12 mmHg  . CAD (coronary artery disease) 1997    CABG  . Unspecified essential hypertension   . History of transesophageal echocardiography (TEE) for monitoring   . Cardiomyopathy   . Other and unspecified hyperlipidemia   . Skin lesions, generalized     facial which may represent actinic keratoses and  possible photosensitivity from Amiodarone  . GERD (gastroesophageal reflux disease)   . History of colonoscopy   . 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 Acute myocardial infarction of inferior wall 1980    Past Surgical History  Procedure Date  . Tonsillectomy   . Coronary artery bypass graft 02/09/1996    LIMA to diag-LAD, SVG to OM1-OM2-OM3, SVG to [facility]  . Cholecystectomy   . Cardioversion 11/18/2006    Dr. Jacklynn Bue  . Cataract extraction w/ intraocular lens implant April '13  (Dr. Dagoberto Ligas)    left eye only    Family History  Problem Relation Age of Onset  . Stroke Mother 73    Deceased  . Heart attack Father 69    Deceased  . Atrial fibrillation Brother   . Crohn's disease Mother     Deceased  . Heart disease Father   . Heart disease Paternal Uncle   . Prostate cancer Paternal Uncle   . Heart failure Father     Deceased    Social History History  Substance Use Topics  . Smoking status: Never Smoker   . Smokeless tobacco: Never Used   Comment: Does not smoke.  [name] Alcohol Use: No    Current Outpatient Prescriptions  Medication Sig Dispense Refill  . amiodarone (PACERONE) 200 MG tablet Take 0.5 tablets (100 mg total) by mouth daily.  30 tablet  6  . amLODipine (NORVASC) 5 MG tablet Take 1 tablet (5 mg total) by mouth daily.  30 tablet  10  . Ascorbic Acid (VITAMIN C) 1000 MG tablet Take 1,000 mg by mouth daily.        [name] aspirin 81 MG tablet Take 81 mg by mouth daily.        [name] atorvastatin (LIPITOR) 80 MG tablet Take 0.5 tablets (40 mg total) by mouth daily.  45 tablet  1  . loratadine (CLARITIN) 10 MG tablet Take 10 mg by mouth daily as needed.        . Multiple Vitamins-Minerals (MULTIVITAMIN,TX-MINERALS) tablet Take 1 tablet by mouth daily.        [name] olmesartan (BENICAR) 40 MG tablet Take 1 tablet (40 mg total) by mouth daily.  28 tablet   0  . pantoprazole (PROTONIX) 40 MG tablet Take 1 tablet (40 mg total) by mouth daily.  30 tablet  3  . warfarin (COUMADIN) 5 MG tablet Take 1 tablet (5 mg total) by mouth as directed.  60 tablet  2    Allergies  Allergen Reactions  . Codeine   . Iodine       Review of Systems:   General:  normal appetite, normal energy, no weight gain, no weight loss, no fever  Cardiac:  occasional chest pain only with strenuous exertion, no chest pain at rest, mild SOB with strenuous exertion, no resting SOB, no PND, no orthopnea, no palpitations, no arrhythmia, no atrial fibrillation recently - last episode several years ago, no LE edema, no dizzy spells, no syncope  Respiratory:  no shortness of breath, no home oxygen, no productive cough, no dry cough, no bronchitis, no wheezing, no hemoptysis, no asthma, no pain with inspiration or cough, no sleep apnea, no CPAP at night  GI:   no difficulty swallowing, no reflux, no frequent heartburn, no hiatal hernia, no abdominal pain, no constipation, no diarrhea, no hematochezia, no hematemesis, no melena  GU:   no dysuria,  no frequency, no urinary tract infection, no hematuria, + enlarged prostate, no kidney stones, no kidney disease  Vascular:  no pain suggestive of claudication, no pain in feet, no leg cramps, no varicose veins, no DVT, no non-healing foot ulcer  Neuro:   no stroke, no TIA's, no seizures, no headaches, no temporary blindness one eye,  no slurred speech, no peripheral neuropathy, no chronic pain, no instability of gait, no memory/cognitive dysfunction  Musculoskeletal: No significant arthritis, no joint swelling, no myalgias, no difficulty walking, normal mobility   Skin:   no rash, no itching, no skin infections, no pressure sores or ulcerations  Psych:   no anxiety, no depression, no nervousness, no unusual recent stress  Eyes:   no blurry vision, no floaters, no recent vision changes, + wears glasses or contacts  ENT:   no hearing loss, no  loose or painful teeth, no dentures, last saw dentist May 2013  Hematologic:  + easy bruising, no abnormal bleeding, no clotting disorder, no frequent epistaxis  Endocrine:  no diabetes     Physical Exam:   BP 128/74  Pulse 61  Resp 18  Ht 5\' 3"  (1.6 m)  Wt 135 lb (61.236 kg)  BMI 23.91 kg/m2  SpO2 98%  General:    well-appearing  HEENT:  Unremarkable   Neck:   no JVD, no bruits, no adenopathy   Chest:   clear to auscultation, symmetrical breath sounds, no wheezes, no rhonchi, well-healed sternotomy scar  CV:   RRR, grade IV/VI crescendo/decrescendo systolic murmur   Abdomen:  soft, non-tender, no masses  Extremities:  warm, well-perfused, pulses palpable, no LE edema, old scar right thigh and leg from SVG harvest, bilateral LE hemosiderosis  Rectal/GU  Deferred  Neuro:   Grossly non-focal and symmetrical throughout  Skin:   Clean and dry, no rashes, no breakdown   Diagnostic Tests:  Cardiology Nuclear Med Study  YOSIAH JASMIN is a 71 y.o. male [id number] 09-01-1941   Nuclear Med Background Indication for Stress Test:  Evaluation for Ischemia and Graft Patency History:  '80 MI,'97 Heart Catheterization>CABG,'06 Cardioversion,'09 MPS:EF=41%,Large inferior/lateral scar,no ischemia,2/12 ZHY:QMVHQI, 8/12 Echo:EF=30-35% with moderate AS and Abnormal EKG:NS with IVCD and IWMI Cardiac Risk Factors: Family History - CAD, Hypertension and Lipids   Symptoms:  none   Nuclear Pre-Procedure Caffeine/Decaff Intake:  None  NPO After: 7:00pm    Lungs:  clear  IV 0.9% NS with Angio Cath:  20g   IV Site: R Forearm   IV Started by:  Stanton Kidney, EMT-P   Chest Size (in):  40  Cup Size: n/a   Height: 5\' 2"  (1.575 m)   Weight:  134 lb (60.782 kg)   BMI:  Body mass index is 24.51 kg/(m^2).  Tech Comments:  none     Nuclear Med Study 1 or 2 day study: 1 day   Stress Test Type:  Stress   Reading MD: Marca Ancona, MD   Order Authorizing Provider:  Axel Filler   Resting  Radionuclide: Technetium 84m Tetrofosmin   Resting Radionuclide Dose: 11.0 mCi  Stress Radionuclide:  Technetium 57m Tetrofosmin   Stress Radionuclide Dose: 33.0 mCi            Stress Protocol Rest HR: 62  Stress HR: 150   Rest BP: 143/86  Stress BP: 151/68   Exercise Time (min): 10:24  METS: 12:30    Predicted Max HR: 148 bpm % Max HR: 101.35 bpm Rate Pressure Product: [address]    Dose of Adenosine (mg):  n/a  Dose of Lexiscan: n/a mg   Dose of Atropine (mg): n/a  Dose of Dobutamine: n/a mcg/kg/min (at max HR)   Stress Test Technologist: Cathlyn Parsons, RN   Nuclear Technologist:  Doyne Keel, CNMT      Rest Procedure:  Myocardial perfusion imaging was performed at rest 45 minutes following the intravenous administration of Technetium 32m Tetrofosmin. Rest ECG: NSR  Stress Procedure:  The patient exercised for 10:24.  The patient stopped due to target HR achieved and denied any chest pain.  There were nonspecific ST-T wave changes.  No ectopy noted.Technetium 48m Tetrofosmin was injected at peak exercise and myocardial perfusion imaging was performed after a brief delay. Stress ECG: No significant change from baseline ECG  QPS Raw Data Images:  Normal; no motion artifact; normal heart/lung ratio. Stress Images:  Large, severe inferior and inferolateral perfusion defect.  Rest Images:  Large, severe inferior and inferolateral perfusion defect.  Subtraction (SDS):  Large, fixed, severe inferior and inferolateral perfusion defect.   Transient Ischemic Dilatation (Normal <1.22):  0.95 Lung/Heart Ratio (Normal <0.45):  0.23  Quantitative Gated Spect Images QGS EDV:  128 ml QGS ESV:  77 ml QGS cine images:  Inferior and inferolateral perfusion defect.  QGS EF: 40%  Impression Exercise Capacity:  Excellent exercise capacity. BP Response:  Hypotensive blood pressure response. Clinical Symptoms:  No chest pain.  ECG Impression:  No significant ST segment change suggestive of  ischemia. Comparison with Prior Nuclear Study: No significant change from previous study  Overall Impression:  Abnormal stress nuclear study.  Large, fixed, severe inferior and inferolateral perfusion defect suggestive of prior MI.  No significant ischemia.  EF 40% with inferior and inferolateral hypokinesis.  Marca Ancona   Transthoracic Echocardiography  Patient:    Elaine, Middleton MR #:       [id number] Study Date: 07/14/2011 Gender:     M Age:        70 Height:     154.9cm Weight:     59kg BSA:        1.9m^2 Pt. Status: Room:    ATTENDING    Default, Provider W  REFERRING    Norins, Carney Harder  [facility]     [name], MD  SONOGRAPHER  Aida Raider  PERFORMING   [facility] cc:  -------------------------------------------------------------------- Indications:   424.1 Aortic valve disorders.  -------------------------------------------------------------------- History:  PMH: Acquired from the patient and from the patient's chart. PMH: Coronary Atherosclerosis. Ischemic cardiomyopathy with EF 40%. Atrial Fibrillation. GERD. Hyperthyroidism. Risk factors: Hypertension. Dyslipidemia.  -------------------------------------------------------------------- Study Conclusions  - Left ventricle: The cavity size was normal. Wall thickness was   normal. Systolic function was moderately to severely reduced. The   estimated ejection fraction was in the range of 30% to 35%. There   is moderate hypokinesis of the mid-distal inferior myocardium.   Doppler parameters are consistent with abnormal left ventricular   relaxation (grade 1 diastolic dysfunction). - Aortic valve: There was moderate stenosis. - Mitral valve: Calcified annulus. - Left atrium: The atrium  was moderately dilated. - Right ventricle: The cavity size was mildly dilated.  -------------------------------------------------------------------- Labs, prior tests, procedures, and  surgery: Coronary artery bypass grafting. Transthoracic echocardiography. M-mode, complete 2D, spectral Doppler, and color Doppler. Height: Height: 154.9cm. Height: 61in. Weight: Weight: 59kg. Weight: 129.7lb. Body mass index: BMI: 24.6kg/m^2. Body surface area: BSA: 1.82m^2. Blood pressure: 120/62. Patient status: Outpatient. Location: [facility]  --------------------------------------------------------------------  -------------------------------------------------------------------- Left ventricle: The cavity size was normal. Wall thickness was normal. Systolic function was moderately to severely reduced. The estimated ejection fraction was in the range of 30% to 35%. Regional wall motion abnormalities: There is moderate hypokinesis of the mid-distal inferior myocardium. Doppler parameters are consistent with abnormal left ventricular relaxation (grade 1 diastolic dysfunction).  -------------------------------------------------------------------- Aortic valve: Mildly thickened, mildly calcified leaflets. Doppler: There was moderate stenosis. No significant regurgitation.  VTI ratio of LVOT to aortic valve: 0.2. Indexed valve area: 0.43cm^2/m^2 (VTI). Peak velocity ratio of LVOT to aortic valve: 0.2. Indexed valve area: 0.45cm^2/m^2 (Vmax).  Mean gradient: 35mm Hg (S). Peak gradient: 56mm Hg (S).  -------------------------------------------------------------------- Aorta: Aortic root: The aortic root was normal in size. Ascending aorta: The ascending aorta was normal in size.  -------------------------------------------------------------------- Mitral valve: Calcified annulus. Leaflet separation was normal. Doppler: Transvalvular velocity was within the normal range. There was no evidence for stenosis. Trivial regurgitation.  Peak gradient: 3mm Hg (D).  -------------------------------------------------------------------- Left atrium: The atrium was moderately  dilated.  -------------------------------------------------------------------- Right ventricle: The cavity size was mildly dilated. Systolic function was normal.  -------------------------------------------------------------------- Pulmonic valve: The valve appears to be grossly normal. Doppler: No significant regurgitation.  -------------------------------------------------------------------- Tricuspid valve: Structurally normal valve. Leaflet separation was normal. Doppler: Transvalvular velocity was within the normal range. Mild regurgitation.  -------------------------------------------------------------------- Right atrium: The atrium was normal in size.  -------------------------------------------------------------------- Pericardium: There was no pericardial effusion.  --------------------------------------------------------------------  2D measurements            Normal  Doppler measurements      Normal Left ventricle                     Main pulmonary artery LVID ED,       44.2 mm     43-52   Pressure, S    18 mm Hg   =30 chord, PLAX                        Left ventricle LVID ES,         39 mm     23-38   Ea, lat      8.86 cm/s    ------- chord, PLAX                        ann, tiss FS, chord,       12 %      >29     DP PLAX                               E/Ea, lat   10.02         ------- LVPW, ED       9.07 mm     ------  ann, tiss IVS/LVPW       1.09        <1.3    DP ratio, ED  Ea, med      4.74 cm/s    ------- Ventricular septum                 ann, tiss IVS, ED        9.85 mm     ------  DP LVOT                               E/Ea, med   18.73         ------- Diam, S          21 mm     ------  ann, tiss Area           3.46 cm^2   ------  DP Diam             21 mm     ------  LVOT Aorta                              Peak vel, S  76.2 cm/s    ------- Root diam, ED    29 mm     ------  VTI, S         20 cm      ------- Left atrium                         Stroke vol   69.3 ml      ------- AP dim           53 mm     ------  Stroke       44.1 ml/m^2  ------- AP dim index   3.38 cm/m^2 <2.2    index                                    Aortic valve                                    Peak vel, S   374 cm/s    -------                                    Mean vel, S   287 cm/s    -------                                    VTI, S        102 cm      -------                                    Mean           35 mm Hg   -------                                    gradient, S  Peak           56 mm Hg   -------                                    gradient, S                                    VTI ratio     0.2         -------                                    LVOT/AV                                    Area index   0.43 cm^2/m^ -------                                    (VTI)             2                                    Peak vel      0.2         -------                                    ratio,                                    LVOT/AV                                    Area index   0.45 cm^2/m^ -------                                    (Vmax)            2                                    Mitral valve                                    Peak E vel   88.8 cm/s    -------                                    Peak A vel   92.3 cm/s    -------  Deceleratio   401 ms      150-230                                    n time                                    Peak            3 mm Hg   -------                                    gradient, D                                    Peak E/A        1         -------                                    ratio                                    Tricuspid valve                                    Regurg peak   183 cm/s    -------                                    vel                                    Peak RV-RA     13 mm Hg   -------                                     gradient, S                                    Systemic veins                                    Estimated       5 mm Hg   -------                                    CVP                                    Right ventricle  Pressure, S    18 mm Hg   <30                                    Sa vel, lat  9.47 cm/s    -------                                    ann, tiss                                    DP   -------------------------------------------------------------------- Prepared and Electronically Authenticated by  Cassell Clement, MD 2012-08-09T15:15:24.473    Transthoracic Echocardiography  Patient:    Rahkeem, Senft MR #:       [id number] Study Date: 07/09/2012 Gender:     M Age:        6 Height:     157.5cm Weight:     62.1kg BSA:        1.79m^2 Pt. Status: Room:    ATTENDING    Janan Halter, Randa Evens  PERFORMING   [facility]  SONOGRAPHER  Philomena Course, RDCS cc:  ------------------------------------------------------------ LV EF: 35% -   40%  ------------------------------------------------------------ Indications:      Aortic stenosis 424.1.  ------------------------------------------------------------ History:   PMH:  Acquired from the patient and from the patient's chart. Paroxysmal Atrial fibrillation.  Coronary artery disease.  Cardiomyopathy.  Moderate aortic stenosis. Risk factors:  Hypertension. Dyslipidemia.  ------------------------------------------------------------ Study Conclusions  - Left ventricle: The cavity size was normal. Wall thickness   was increased in a pattern of mild LVH. Systolic function   was moderately reduced. The estimated ejection fraction   was in the range of 35% to 40%. Diffuse hypokinesis. - Aortic valve: The valve is calcified, There is severe   aortic stenosis. Peak velocity: 407cm/s (S). Mean    gradient: 38mm Hg (S). Peak gradient: 66mm Hg (S). - Mitral valve: Calcified annulus. - Left atrium: The atrium was moderately dilated. - Right ventricle: The cavity size was mildly dilated.   Systolic function was mildly reduced. - Right atrium: The atrium was mildly dilated. - Impressions: The aortic stenosis may be a little worse   this year. Impressions:  - The aortic stenosis may be a little worse this year.  ------------------------------------------------------------ Labs, prior tests, procedures, and surgery: Coronary artery bypass grafting (1997).  Transthoracic echocardiography.  M-mode, complete 2D, spectral Doppler, and color Doppler.  Height:  Height: 157.5cm. Height: 62in.  Weight:  Weight: 62.1kg. Weight: 136.7lb.  Body mass index:  BMI: 25.1kg/m^2.  Body surface area:    BSA: 1.8m^2.  Blood pressure:     122/68.  Patient status:  Outpatient.  Location:  [facility]  ------------------------------------------------------------  ------------------------------------------------------------ Left ventricle:  The cavity size was normal. Wall thickness was increased in a pattern of mild LVH. Systolic function was moderately reduced. The estimated ejection fraction was in the range of 35% to 40%. Diffuse hypokinesis.  ------------------------------------------------------------ Aortic valve:  The valve is calcified, There is severe aortic stenosis.  Doppler:     VTI ratio of LVOT to aortic valve: 0.19. Indexed valve area: 0.49cm^2/m^2 (VTI). Peak velocity ratio of LVOT to aortic valve: 0.2.  Indexed valve area: 0.5cm^2/m^2 (Vmax).    Mean gradient: 38mm Hg (S). Peak gradient: 66mm Hg (S).  ------------------------------------------------------------ Aorta:  Aortic root: The aortic root was normal in size.  ------------------------------------------------------------ Mitral valve:   Calcified annulus.  Doppler:   Trivial regurgitation.    Peak gradient: 4mm Hg  (D).  ------------------------------------------------------------ Left atrium:  The atrium was moderately dilated.  ------------------------------------------------------------ Right ventricle:  The cavity size was mildly dilated. Systolic function was mildly reduced.  ------------------------------------------------------------ Pulmonic valve:    The valve appears to be grossly normal.  Doppler:   No significant regurgitation.  ------------------------------------------------------------ Tricuspid valve:   The valve appears to be grossly normal.  Doppler:   Mild regurgitation.  ------------------------------------------------------------ Right atrium:  The atrium was mildly dilated.  ------------------------------------------------------------ Pericardium:  There was no pericardial effusion.  ------------------------------------------------------------  2D measurements        Normal  Doppler measurements    Norma Left ventricle                                         l LVID ED,   42.7 mm     43-52   Main pulmonary artery chord,                         Pressure,   26 mm Hg    =30 PLAX                           S LVID ES,   31.7 mm     23-38   Left ventricle chord,                         Ea, lat    10. cm/s     ----- PLAX                           ann, tiss    3 FS, chord,   26 %      >29     DP PLAX                           E/Ea, lat  10.          ----- LVPW, ED   10.5 mm     ------  ann, tiss   19 IVS/LVPW   0.95        <1.3    DP ratio, ED                      Ea, med    5.7 cm/s     ----- Ventricular septum             ann, tiss IVS, ED      10 mm     ------  DP LVOT                           E/Ea, med  18.          ----- Diam, S      23 mm     ------  ann, tiss   42 Area       4.15 cm^2   ------  DP Diam  23 mm     ------  LVOT Aorta                          Peak vel,  80. cm/s     ----- Root diam,   32 mm     ------  S            5 ED                              VTI, S     20. cm       ----- Left atrium                                 6 AP dim       47 mm     ------  HR          41 bpm      ----- AP dim     2.88 cm/m^2 <2.2    Stroke vol 85. ml       ----- index                                       6                                Cardiac    3.5 L/min    -----                                output                                Cardiac    2.2 L/(min-m -----                                index          ^2)                                Stroke     52. ml/m^2   -----                                index        5                                Aortic valve                                Peak vel,  407 cm/s     -----                                S  Mean vel,  286 cm/s     -----                                S                                VTI, S     107 cm       -----                                Mean        38 mm Hg    -----                                gradient,                                S                                Peak        66 mm Hg    -----                                gradient,                                S                                VTI ratio  0.1          -----                                LVOT/AV      9                                Area index 0.4 cm^2/m^2 -----                                (VTI)        9                                Peak vel   0.2          -----                                ratio,                                LVOT/AV  Area index 0.5 cm^2/m^2 -----                                (Vmax)                                Mitral valve                                Peak E vel 105 cm/s     -----                                Peak A vel 99. cm/s     -----                                             7                                Decelerati 215 ms       150-2                                on time                  30                                Peak         4 mm Hg    -----                                gradient,                                D                                Peak E/A   1.1          -----                                ratio                                Tricuspid valve                                Regurg     229 cm/s     -----                                peak vel  Peak RV-RA  21 mm Hg    -----                                gradient,                                S                                Systemic veins                                Estimated    5 mm Hg    -----                                CVP                                Right ventricle                                Pressure,   26 mm Hg    <30                                S                                Sa vel,    10. cm/s     -----                                lat ann,     9                                tiss DP   ------------------------------------------------------------ Prepared and Electronically Authenticated by  Willa Rough 2013-08-05T18:[id number]   Cardiac Catheterization Procedure Note  Name: GREYDON BETKE MRN: [id number] DOB: 1941-04-30  Procedure: Right Heart Cath, Left Heart Cath, Selective Coronary Angiography, LV angiography  Indication: Severe aortic stenosis. This 71 year old gentleman with remote coronary bypass surgery has developed progressive aortic stenosis, now in the severe range. He presents for right and left heart catheterization for further evaluation.             Procedural Details: The right groin was prepped, draped, and anesthetized with 1% lidocaine. Using the modified Seldinger technique a 6 French sheath was placed in the right femoral artery and a 6 French sheath was placed in the right femoral vein. A multipurpose catheter was used for the right heart catheterization. Standard protocol was followed for recording of right  heart pressures and sampling of oxygen saturations. Fick cardiac output was calculated. Standard Judkins catheters were used for selective coronary angiography and left ventriculography. An AL-1 catheter and straight-tip wire were used to cross the aortic valve. A Langston catheter was used to record simultaneous LV and AO pressure. There were no immediate  procedural complications. The patient was transferred to the post catheterization recovery area for further monitoring.  Procedural Findings: Hemodynamics RA 3 RV 21/5 PA 23/6 with a mean of 12 PCWP A wave 7 V wave 5 mean of 5 LV 137/16 AO 100/50 with a mean of 71  Oxygen saturations: PA 70 AO 93  Cardiac Output (Fick) 5.2 Cardiac Index (Fick) 3.2  Aortic valve hemodynamics: Mean gradient 44, valve area 0.94 square centimeters     Coronary angiography: Coronary dominance: right  Left mainstem: The left mainstem is densely calcified. There is 90% distal left main stenosis the  Left anterior descending (LAD): The LAD is densely calcified. The vessel is totally occluded at the first septal perforator.  Left circumflex (LCx): The left circumflex is densely calcified. The vessel is totally occluded prior to the first obtuse marginal.  Right coronary artery (RCA): The right coronary artery is heavily calcified. The vessel is totally occluded at its origin.  Saphenous vein graft sequence to acute marginals 1 and 2 in the right PDA is patent. There are some irregularities but no significant stenoses throughout the body of the vein graft. The acute marginal branches are patent. The PDA is patent. The posterior AV segment is severely diseased but several posterolateral branches fill from this graft to  Saphenous vein graft sequence to OM1, OM 2, and ON 3 is patent. The body of the graft has no significant disease. The proximal OM branches are tiny. The major branches the OM 3 which is patent. There is a high-grade 90% stenosis in the  midportion of that obtuse marginal target vessel. Beyond the area of severe stenosis the vessel divides into twin branches.  LIMA to LAD: This graft was imaged subselectively from the left subclavian because of severe subclavian tortuosity. The LIMA is widely patent. The LAD and diagonal branches remain patent and the anastomotic site appears widely patent.  Left ventriculography: Left ventricular function is severely depressed. There is dense akinesis of the basal and midinferior walls. There is hypokinesis of anterolateral wall and apex. The left ventricular ejection fraction is estimated at 35%. There is no significant mitral regurgitation.  Aortic valve: The aortic valve is severely calcified. There is restricted opening of the aortic valve leaflets which is well seen on plain fluoroscopy.  Final Conclusions:   1. Severe native three-vessel coronary artery disease with total occlusion of all major epicardial coronary vessels 2. Status post aortocoronary bypass surgery with continued patency of all grafts. Diffuse small vessel disease as described, primarily in the posterolateral branches and third obtuse marginal branch   3. Severe calcific aortic stenosis with a mean gradient of 44 mm mercury and aortic valve area of 0.9 4. Severe segmental left ventricular dysfunction with an estimated LVEF of 35%  Recommendations: Mr. Tremaine has severe aortic stenosis with at least moderately severe LV dysfunction. Despite his low ejection fraction, his cardiac output is well maintained based on Fick measurements. He is minimally symptomatic. The major question is whether to proceed with aortic valve replacement at this time considering his LV dysfunction and slowly progressive severe aortic stenosis. I am going to refer him for a cardiac surgery evaluation.  [name] 08/02/2012, 9:30 AM    Impression:  The patient has severe aortic stenosis with underlying long-standing history of severe  three-vessel coronary artery disease and ischemic cardiomyopathy, status post coronary artery bypass grafting in 1997. His bypass grafts remain patent and functional at this time although there has been some progression of native  coronary artery disease as well as vein graft disease. but without any high-grade proximal obstruction. Patient also has history of atrial fibrillation but has been maintaining sinus rhythm for several years on amiodarone and Coumadin therapy. He remains remarkably asymptomatic at this time and is quite active physically. The patient's aortic stenosis is clearly progressed on followup echocardiograms, and has now gotten to the point where by definition he has severe aortic stenosis. However, given the fact that the patient remains remarkably free of symptoms (including the fact that he did exceptionally well on a routine stress Myoview exam earlier this year) I do not feel compelled to strongly recommend surgical intervention at this time. Nonetheless, based upon the progression of disease on followup echocardiograms, I do think it is reasonable to consider elective surgery at this time or in the relatively near future.   Plan:  I discussed matters at length with the patient and his wife here in the office today. The patient was counseled at length regarding surgical alternatives with respect to valve replacement.  We discussed risks associated with continued close observation on medical therapy versus proceeding with elective aortic valve replacement and redo coronary artery bypass grafting. Transcatheter aortic valve replacement was also discussed, but overall I feel this patient would be reasonably good candidate for conventional surgery. Because the patient remains essentially asymptomatic, I think it remains reasonable to continue to follow him carefully at this time. However, I have cautioned him that it would be important not to miss an opportunity to intervene surgically while  he remains in good shape.  I have alerted him to type of symptoms he should be particularly concerned about should they develop. We will plan to see him back in 6 months. All the questions been addressed.     Salvatore Decent. Cornelius Moras, MD 08/10/2012 1:27 PM

## 2012-09-05 ENCOUNTER — Ambulatory Visit (INDEPENDENT_AMBULATORY_CARE_PROVIDER_SITE_OTHER): Payer: Medicare Other | Admitting: *Deleted

## 2012-09-05 DIAGNOSIS — I4891 Unspecified atrial fibrillation: Secondary | ICD-10-CM

## 2012-09-05 LAB — POCT INR: INR: 2.5

## 2012-09-19 ENCOUNTER — Other Ambulatory Visit: Payer: Self-pay

## 2012-09-19 ENCOUNTER — Other Ambulatory Visit: Payer: Self-pay | Admitting: Cardiology

## 2012-09-19 MED ORDER — AMLODIPINE BESYLATE 5 MG PO TABS: 5.0000 mg | ORAL_TABLET | Freq: Every day | ORAL | Status: DC

## 2012-09-19 MED ORDER — ATORVASTATIN CALCIUM 80 MG PO TABS: 40.0000 mg | ORAL_TABLET | Freq: Every day | ORAL | Status: DC

## 2012-10-17 ENCOUNTER — Ambulatory Visit (INDEPENDENT_AMBULATORY_CARE_PROVIDER_SITE_OTHER): Payer: Medicare Other | Admitting: General Practice

## 2012-10-17 DIAGNOSIS — I4891 Unspecified atrial fibrillation: Secondary | ICD-10-CM

## 2012-10-29 ENCOUNTER — Other Ambulatory Visit: Payer: Self-pay | Admitting: General Practice

## 2012-10-29 MED ORDER — PANTOPRAZOLE SODIUM 40 MG PO TBEC: 40.0000 mg | DELAYED_RELEASE_TABLET | Freq: Every day | ORAL | Status: DC

## 2012-10-29 MED ORDER — WARFARIN SODIUM 5 MG PO TABS: 5.0000 mg | ORAL_TABLET | ORAL | Status: DC

## 2012-11-19 ENCOUNTER — Ambulatory Visit (INDEPENDENT_AMBULATORY_CARE_PROVIDER_SITE_OTHER): Payer: Medicare Other | Admitting: Internal Medicine

## 2012-11-19 ENCOUNTER — Encounter: Payer: Self-pay | Admitting: Internal Medicine

## 2012-11-19 VITALS — BP 124/72 | HR 65 | Temp 97.9°F | Ht 63.0 in | Wt 138.0 lb

## 2012-11-19 DIAGNOSIS — S61209A Unspecified open wound of unspecified finger without damage to nail, initial encounter: Secondary | ICD-10-CM

## 2012-11-19 NOTE — Patient Instructions (Addendum)
Finger Avulsion   When the tip of the finger is lost, a new nail may grow back if part of the fingernail is left. The new nail may be deformed. If just the tip of the finger is lost, no repair may be needed unless there is bone showing. If bone is showing, your caregiver may need to remove the protruding bone and put on a bandage. Your caregiver will do what is best for you. Most of the time when a fingertip is lost, the end will gradually grow back on and look fairly normal, but it may remain sensitive to pressure and temperature extremes for a long time.  HOME CARE INSTRUCTIONS    Keep your hand elevated above your heart to relieve pain and swelling.   Keep your dressing dry and clean.   Change your bandage in 24 hours or as directed.   Only take over-the-counter or prescription medicines for pain, discomfort, or fever as directed by your caregiver.   See your caregiver as needed for problems.  SEEK MEDICAL CARE IF:    You have increased pain, swelling, drainage, or bleeding.   You have a fever.   You have swelling that spreads from your finger and into your hand.  Make sure to check to see if you need a tetanus booster.  Document Released: 01/30/2002 Document Revised: 05/22/2012 Document Reviewed: 12/25/2008  [employer] Patient Information 2013 [employer].

## 2012-11-19 NOTE — Progress Notes (Signed)
Subjective:    Patient ID: Jesus [name], male    DOB: 09/19/1941, 71 y.o.   MRN: [id number]  HPI  Pt presents to the clinic today with c/o of a cut to his hand. He pinched his right pointer finger in between some chairs on Wednesday. He was unable to get the bleeding to stop since he is on coumadin. At urgent care, they super glued the skin flap down. He is still bleeding scantly underneath the superglue. He denies redness, pain or swelling around the cut.  Review of Systems      Past Medical History  Diagnosis Date  . Esophageal reflux   . Atrial fibrillation     holding sinus rhythm on Amiodarone  . Aortic stenosis     mild with a mean aortic valve gradient of 12 mmHg  . CAD (coronary artery disease) 1997    CABG  . Unspecified essential hypertension   . History of transesophageal echocardiography (TEE) for monitoring   . Cardiomyopathy   . Other and unspecified hyperlipidemia   . Skin lesions, generalized     facial which may represent actinic keratoses and possible photosensitivity from Amiodarone  . GERD (gastroesophageal reflux disease)   . History of colonoscopy   . 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 Acute myocardial infarction of inferior wall 1980    Current Outpatient Prescriptions  Medication Sig Dispense Refill  . amiodarone (PACERONE) 200 MG tablet Take 0.5 tablets (100 mg total) by mouth daily.  30 tablet  6  . amLODipine (NORVASC) 5 MG tablet Take 1 tablet (5 mg total) by mouth daily.  30 tablet  6  . Ascorbic Acid (VITAMIN C) 1000 MG tablet Take 1,000 mg by mouth daily.        [name] aspirin 81 MG tablet Take 81 mg by mouth daily.        [name] atorvastatin (LIPITOR) 80 MG tablet Take 0.5 tablets (40 mg total) by mouth daily.  45 tablet  3  . loratadine (CLARITIN) 10 MG tablet Take 10 mg by mouth daily as needed.        . Multiple  Vitamins-Minerals (MULTIVITAMIN,TX-MINERALS) tablet Take 1 tablet by mouth daily.        [name] olmesartan (BENICAR) 40 MG tablet Take 1 tablet (40 mg total) by mouth daily.  28 tablet  0  . pantoprazole (PROTONIX) 40 MG tablet Take 1 tablet (40 mg total) by mouth daily.  30 tablet  3  . warfarin (COUMADIN) 5 MG tablet Take 1 tablet (5 mg total) by mouth as directed. Take as directed by coumadin clinic.  30 tablet  2    Allergies  Allergen Reactions  . Codeine   . Iodine     Family History  Problem Relation Age of Onset  . Stroke Mother 36    Deceased  . Heart attack Father 33    Deceased  . Atrial fibrillation Brother   . Crohn's disease Mother     Deceased  . Heart disease Father   . Heart disease Paternal Uncle   . Prostate cancer Paternal Uncle   . Heart failure Father     Deceased    History   Social History  . Marital Status: Married    Spouse Name: N/A    Number of Children: N/A  . Years of Education: N/A  Occupational History  . Not on file.   Social History Main Topics  . Smoking status: Never Smoker   . Smokeless tobacco: Never Used     Comment: Does not smoke.  [name] Alcohol Use: No  . Drug Use: No  . Sexually Active: Not Currently   Other Topics Concern  . Not on file   Social History Narrative   HSG. [employer] - [employer] 6 years. Married - '69 - 1 year/divorced. '76 - . No children. Work - mfg/textiles - Audiological scientist; currently works doing maintenance. [employer] - they have discussed this. Provided packet august '13.      Constitutional: Denies fever, malaise, fatigue, headache or abrupt weight changes.  Musculoskeletal: Denies decrease in range of motion, difficulty with gait, muscle pain or joint pain and swelling.  Skin: Pt reports cut to his finger. Denies redness, rashes, lesions or ulcercations.  Neurological: Denies  Numbness or tingling in fingers, dizziness, difficulty with memory, difficulty with speech or problems with balance and  coordination.   No other specific complaints in a complete review of systems (except as listed in HPI above).  Objective:   Physical Exam  BP 124/72  Pulse 65  Temp 97.9 F (36.6 C) (Oral)  Ht 5\' 3"  (1.6 m)  Wt 138 lb (62.596 kg)  BMI 24.45 kg/m2  SpO2 97% Wt Readings from Last 3 Encounters:  11/19/12 138 lb (62.596 kg)  08/10/12 135 lb (61.236 kg)  08/02/12 135 lb (61.236 kg)    General: Appears his stated age, well developed, well nourished in NAD. Skin: Right pointer finger with small avulsion, glued down with superglue. Unable to visualize the wound bed but no evidence of infection.Warm, dry and intact. No rashes, lesions or ulcerations noted. Cardiovascular: Normal rate and rhythm. S1,S2 noted.  No murmur, rubs or gallops noted. No JVD or BLE edema. No carotid bruits noted. Musculoskeletal: Normal range of motion. No signs of joint swelling. No difficulty with gait.  Neurological: Alert and oriented. Cranial nerves II-XII intact. Coordination normal. +DTRs bilaterally.        Assessment & Plan:   Avulsion of right pointer finger, new onset with additional workup required:  Cleanse the area with soap and water Do not pick at the superglue, it will wear off on its own Monitor the area for redness, swelling, and pain Apply bacitracin to the area and cover with a bandage  RTC as needed or if symptoms persist

## 2012-11-30 ENCOUNTER — Ambulatory Visit (INDEPENDENT_AMBULATORY_CARE_PROVIDER_SITE_OTHER): Payer: Medicare Other | Admitting: General Practice

## 2012-11-30 DIAGNOSIS — R0989 Other specified symptoms and signs involving the circulatory and respiratory systems: Secondary | ICD-10-CM

## 2012-11-30 DIAGNOSIS — I4891 Unspecified atrial fibrillation: Secondary | ICD-10-CM

## 2012-12-24 ENCOUNTER — Telehealth: Payer: Self-pay | Admitting: Cardiovascular Disease

## 2012-12-24 DIAGNOSIS — I350 Nonrheumatic aortic (valve) stenosis: Secondary | ICD-10-CM

## 2012-12-24 NOTE — Telephone Encounter (Signed)
Pt was in in august and to come back in per pt in 6 months, no recall, pt thinks he also needs an echo, does he and if so can get order?

## 2012-12-24 NOTE — Telephone Encounter (Signed)
I will forward this message to Dr Excell Seltzer to review the pt's chart and determine follow-up plan of care.

## 2012-12-25 NOTE — Telephone Encounter (Signed)
He should have an echo prior to his office visit for six-month followup. His previous echo was in August 2013.  [name] 12/25/2012 5:47 PM

## 2012-12-26 NOTE — Telephone Encounter (Signed)
Order placed for Echo. The pt will be due for appointments in February.

## 2012-12-26 NOTE — Telephone Encounter (Signed)
Appointments are scheduled on 01/07/13.

## 2013-01-01 ENCOUNTER — Other Ambulatory Visit: Payer: Self-pay | Admitting: *Deleted

## 2013-01-01 MED ORDER — OLMESARTAN MEDOXOMIL 40 MG PO TABS: 40.0000 mg | ORAL_TABLET | Freq: Every day | ORAL | Status: DC

## 2013-01-04 ENCOUNTER — Telehealth: Payer: Self-pay | Admitting: Cardiovascular Disease

## 2013-01-04 NOTE — Telephone Encounter (Signed)
Spoke w/pt's wife, Alona Bene.  Initially, echo was denied but was overturned by medical review md and echo is now authorized.  Gave auth# to pt's wife.

## 2013-01-04 NOTE — Telephone Encounter (Signed)
New Problem:    Patient's wife called in because his insurance company has just denied his ECHO scheduled for 01/07/13.  Please call back.

## 2013-01-07 ENCOUNTER — Ambulatory Visit (HOSPITAL_COMMUNITY): Payer: Medicare Other | Attending: Cardiovascular Disease

## 2013-01-07 ENCOUNTER — Encounter: Payer: Self-pay | Admitting: Cardiovascular Disease

## 2013-01-07 ENCOUNTER — Ambulatory Visit (INDEPENDENT_AMBULATORY_CARE_PROVIDER_SITE_OTHER): Payer: Medicare Other | Admitting: *Deleted

## 2013-01-07 ENCOUNTER — Ambulatory Visit (INDEPENDENT_AMBULATORY_CARE_PROVIDER_SITE_OTHER): Payer: Medicare Other | Admitting: Cardiovascular Disease

## 2013-01-07 VITALS — BP 140/70 | HR 55 | Ht 62.0 in | Wt 139.0 lb

## 2013-01-07 DIAGNOSIS — I2510 Atherosclerotic heart disease of native coronary artery without angina pectoris: Secondary | ICD-10-CM | POA: Insufficient documentation

## 2013-01-07 DIAGNOSIS — I10 Essential (primary) hypertension: Secondary | ICD-10-CM

## 2013-01-07 DIAGNOSIS — I359 Nonrheumatic aortic valve disorder, unspecified: Secondary | ICD-10-CM

## 2013-01-07 DIAGNOSIS — I252 Old myocardial infarction: Secondary | ICD-10-CM | POA: Insufficient documentation

## 2013-01-07 DIAGNOSIS — I4891 Unspecified atrial fibrillation: Secondary | ICD-10-CM

## 2013-01-07 DIAGNOSIS — E785 Hyperlipidemia, unspecified: Secondary | ICD-10-CM | POA: Insufficient documentation

## 2013-01-07 DIAGNOSIS — I350 Nonrheumatic aortic (valve) stenosis: Secondary | ICD-10-CM

## 2013-01-07 DIAGNOSIS — I428 Other cardiomyopathies: Secondary | ICD-10-CM | POA: Insufficient documentation

## 2013-01-07 LAB — BASIC METABOLIC PANEL
BUN: 17 mg/dL (ref 6–23)
Creatinine, Ser: 0.8 mg/dL (ref 0.4–1.5)
GFR: 106.59 mL/min (ref >60.00)

## 2013-01-07 LAB — HEPATIC FUNCTION PANEL: Total Bilirubin: 0.8 mg/dL (ref 0.3–1.2)

## 2013-01-07 LAB — TSH: TSH: 1.98 u[IU]/mL (ref 0.35–5.50)

## 2013-01-07 MED ORDER — OLMESARTAN MEDOXOMIL 40 MG PO TABS: 40.0000 mg | ORAL_TABLET | Freq: Every day | ORAL | Status: DC

## 2013-01-07 NOTE — Patient Instructions (Addendum)
Your physician wants you to follow-up in: 6 MONTHS with Dr Excell Seltzer.  You will receive a reminder letter in the mail two months in advance. If you don't receive a letter, please call our office to schedule the follow-up appointment.  Your physician recommends that you have lab work today: BMP, LIVER and TSH  Your physician recommends that you continue on your current medications as directed. Please refer to the Current Medication list given to you today.

## 2013-01-07 NOTE — Progress Notes (Signed)
Echocardiogram performed.  

## 2013-01-07 NOTE — Progress Notes (Signed)
HPI:   72 year old gentleman presenting for followup of aortic stenosis, CAD with history of MI and remote CABG, and paroxysmal atrial fibrillation. He has developed progressive aortic stenosis her last several years. He underwent right and left heart catheterization last year for further evaluation. This demonstrated patency of his bypass grafts, moderately reduced LV function, and severe aortic stenosis. Because of minimal symptoms ongoing medical therapy was recommended.. The patient continues to do well from a symptomatic perspective. He complains of generalized fatigue. He has no exertional dyspnea, chest pain or tightness, or lightheadedness. He's had no palpitations or syncope. He remains active and goes to the gym about 4 days per week. He walks 3 miles per hour on the treadmill at least 30 minutes. He also does some light weightlifting activities.  Outpatient Encounter Prescriptions as of 01/07/2013  Medication Sig Dispense Refill  . amiodarone (PACERONE) 200 MG tablet Take 0.5 tablets (100 mg total) by mouth daily.  30 tablet  6  . amLODipine (NORVASC) 5 MG tablet Take 1 tablet (5 mg total) by mouth daily.  30 tablet  6  . Ascorbic Acid (VITAMIN C) 1000 MG tablet Take 1,000 mg by mouth daily.        [name] aspirin 81 MG tablet Take 81 mg by mouth daily.        [name] atorvastatin (LIPITOR) 80 MG tablet Take 0.5 tablets (40 mg total) by mouth daily.  45 tablet  3  . loratadine (CLARITIN) 10 MG tablet Take 10 mg by mouth daily as needed.        . Multiple Vitamins-Minerals (MULTIVITAMIN,TX-MINERALS) tablet Take 1 tablet by mouth daily.        [name] olmesartan (BENICAR) 40 MG tablet Take 1 tablet (40 mg total) by mouth daily.  30 tablet  2  . pantoprazole (PROTONIX) 40 MG tablet Take 1 tablet (40 mg total) by mouth daily.  30 tablet  3  . warfarin (COUMADIN) 5 MG tablet Take 1 tablet (5 mg total) by mouth as directed. Take as directed by coumadin clinic.  30 tablet  2    Allergies  Allergen Reactions    . Codeine   . Iodine     Past Medical History  Diagnosis Date  . Esophageal reflux   . Atrial fibrillation     holding sinus rhythm on Amiodarone  . Aortic stenosis     mild with a mean aortic valve gradient of 12 mmHg  . CAD (coronary artery disease) 1997    CABG  . Unspecified essential hypertension   . History of transesophageal echocardiography (TEE) for monitoring   . Cardiomyopathy   . Other and unspecified hyperlipidemia   . Skin lesions, generalized     facial which may represent actinic keratoses and possible photosensitivity from Amiodarone  . GERD (gastroesophageal reflux disease)   . History of colonoscopy   . 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 Acute myocardial infarction of inferior wall 1980   ROS: Negative except as per HPI  BP 140/70  Pulse 55  Ht 5\' 2"  (1.575 m)  Wt 63.05 kg (139 lb)  BMI 25.42 kg/m2  PHYSICAL EXAM: Pt is alert and oriented, NAD HEENT: normal Neck: JVP - normal, carotids 2+= with bilateral Lungs: CTA bilaterally CV: RRR with grade 3/6 harsh crescendo decrescendo murmur at the upper sternal border, no diastolic murmur. Abd:  soft, NT, Positive BS, no hepatomegaly Ext: no C/C/E, distal pulses intact and equal Skin: warm/dry no rash  EKG:  Sinus bradycardia 55 beats per minute, nonspecific IVCD, cannot exclude age-indeterminate inferolateral MI.  Myoview Feb 2013: Quantitative Gated Spect Images  QGS EDV: 128 ml  QGS ESV: 77 ml  QGS cine images: Inferior and inferolateral perfusion defect.  QGS EF: 40%  Impression  Exercise Capacity: Excellent exercise capacity.  BP Response: Hypotensive blood pressure response.  Clinical Symptoms: No chest pain.  ECG Impression: No significant ST segment change suggestive of ischemia.  Comparison with Prior Nuclear Study: No significant change from previous study  Overall  Impression: Abnormal stress nuclear study. Large, fixed, severe inferior and inferolateral perfusion defect suggestive of prior MI. No significant ischemia. EF 40% with inferior and inferolateral hypokinesis.  Jesus [name]  Cardiac Cath 08/02/2012: Procedural Findings:  Hemodynamics  RA 3  RV 21/5  PA 23/6 with a mean of 12  PCWP A wave 7 V wave 5 mean of 5  LV 137/16  AO 100/50 with a mean of 71  Oxygen saturations:  PA 70  AO 93  Cardiac Output (Fick) 5.2  Cardiac Index (Fick) 3.2  Aortic valve hemodynamics: Mean gradient 44, valve area 0.94 square centimeters  Coronary angiography:  Coronary dominance: right  Left mainstem: The left mainstem is densely calcified. There is 90% distal left main stenosis the  Left anterior descending (LAD): The LAD is densely calcified. The vessel is totally occluded at the first septal perforator.  Left circumflex (LCx): The left circumflex is densely calcified. The vessel is totally occluded prior to the first obtuse marginal.  Right coronary artery (RCA): The right coronary artery is heavily calcified. The vessel is totally occluded at its origin.  Saphenous vein graft sequence to acute marginals 1 and 2 in the right PDA is patent. There are some irregularities but no significant stenoses throughout the body of the vein graft. The acute marginal branches are patent. The PDA is patent. The posterior AV segment is severely diseased but several posterolateral branches fill from this graft to  Saphenous vein graft sequence to OM1, OM 2, and ON 3 is patent. The body of the graft has no significant disease. The proximal OM branches are tiny. The major branches the OM 3 which is patent. There is a high-grade 90% stenosis in the midportion of that obtuse marginal target vessel. Beyond the area of severe stenosis the vessel divides into twin branches.  LIMA to LAD: This graft was imaged subselectively from the left subclavian because of severe subclavian  tortuosity. The LIMA is widely patent. The LAD and diagonal branches remain patent and the anastomotic site appears widely patent.  Left ventriculography: Left ventricular function is severely depressed. There is dense akinesis of the basal and midinferior walls. There is hypokinesis of anterolateral wall and apex. The left ventricular ejection fraction is estimated at 35%. There is no significant mitral regurgitation.  Aortic valve: The aortic valve is severely calcified. There is restricted opening of the aortic valve leaflets which is well seen on plain fluoroscopy.  Final Conclusions:  1. Severe native three-vessel coronary artery disease with total occlusion of all major epicardial coronary vessels  2. Status post aortocoronary bypass surgery with continued patency of all grafts. Diffuse small vessel disease as described, primarily in the posterolateral branches and third obtuse marginal branch  3. Severe calcific aortic stenosis with a mean gradient of 44 mm mercury and aortic valve area of 0.9  4. Severe segmental left ventricular dysfunction with an estimated LVEF of 35%  Recommendations: Jesus [name] has severe aortic stenosis with at least moderately severe LV dysfunction. Despite his low ejection fraction, his cardiac output is well maintained based on Fick measurements. He is minimally symptomatic. The major question is whether to proceed with aortic valve replacement at this time considering his LV dysfunction and slowly progressive severe aortic stenosis. I am going to refer him for a cardiac surgery evaluation.   2D Echo: Left ventricle: The cavity size was normal. Wall thickness was increased in a pattern of mild LVH. Systolic function was mildly reduced. The estimated ejection fraction was in the range of 45% to 50%. Regional wall motion abnormalities: Hypokinesis of the entire myocardium. Akinesis of the basal-midinferior myocardium. Doppler parameters are consistent with abnormal  left ventricular relaxation (grade 1 diastolic dysfunction).  ------------------------------------------------------------ Aortic valve: Trileaflet. Doppler: There was severe stenosis. No regurgitation. VTI ratio of LVOT to aortic valve: 0.14. Indexed valve area: 0.37cm^2/m^2 (VTI). Peak velocity ratio of LVOT to aortic valve: 0.18. Indexed valve area: 0.47cm^2/m^2 (Vmax). Mean gradient: 39mm Hg (S). Peak gradient: 67mm Hg (S).  ------------------------------------------------------------ Aorta: The aorta was normal, not dilated, and non-diseased. Aortic root: The aortic root was normal in size. Ascending aorta: The ascending aorta was normal in size.  ------------------------------------------------------------ Mitral valve: Structurally normal valve. Leaflet separation was normal. Doppler: Transvalvular velocity was within the normal range. There was no evidence for stenosis. Trivial regurgitation. Peak gradient: 2mm Hg (D).  ------------------------------------------------------------ Left atrium: The atrium was mildly to moderately dilated.  ------------------------------------------------------------ Atrial septum: No defect or patent foramen ovale was identified.  ------------------------------------------------------------ Right ventricle: The cavity size was normal. Wall thickness was normal. Systolic function was mildly reduced.  ------------------------------------------------------------ Pulmonic valve: Structurally normal valve. Cusp separation was normal. Doppler: Transvalvular velocity was within the normal range. No significant regurgitation.  ------------------------------------------------------------ Tricuspid valve: Structurally normal valve. Leaflet separation was normal. Doppler: Transvalvular velocity was within the normal range. Trivial regurgitation.  ------------------------------------------------------------ Pulmonary artery: The main pulmonary  artery was normal-sized.  ------------------------------------------------------------ Right atrium: The atrium was normal in size.  ------------------------------------------------------------ Pericardium: The pericardium was normal in appearance.  ------------------------------------------------------------ Systemic veins: Inferior vena cava: The vessel was normal in size; the respirophasic diameter changes were in the normal range (= 50%); findings are consistent with normal central venous pressure.  ------------------------------------------------------------ Post procedure conclusions Ascending Aorta:  - The aorta was normal, not dilated, and non-diseased.  ------------------------------------------------------------  2D measurements Normal Doppler measurements Norma Left ventricle l LVID ED, 43 mm 43-52 Main pulmonary artery chord, Pressure, 24 mm Hg =30 PLAX S LVID ES, 32.1 mm 23-38 Left ventricle chord, Ea, lat 9.4 cm/s ----- PLAX ann, tiss 3 FS, chord, 25 % >29 DP PLAX E/Ea, lat 7.6 ----- LVPW, ED 12.1 mm ------ ann, tiss 7 IVS/LVPW 1.03 <1.3 DP ratio, ED Ea, med 6.6 cm/s ----- Ventricular septum ann, tiss 9 IVS, ED 12.5 mm ------ DP LVOT E/Ea, med 10. ----- Diam, S 23 mm ------ ann, tiss 81 Area 4.15 cm^2 ------ DP Diam 23 mm ------ LVOT Aorta Peak vel, 74. cm/s ----- Root diam, 32 mm ------ S 5 ED VTI, S 16. cm ----- Left atrium 5 AP dim 50 mm ------ HR 55 bpm ----- AP dim 3.09 cm/m^2 <2.2 Stroke vol 68. ml ----- index 6 Cardiac 3.8 L/min ----- output Cardiac 2.3 L/(min-m ----- index ^2) Stroke 42. ml/m^2 ----- index 3 Aortic valve Peak vel, 409 cm/s ----- S Mean vel, 289  cm/s ----- S VTI, S 114 cm ----- Mean 39 mm Hg ----- gradient, S Peak 67 mm Hg ----- gradient, S VTI ratio 0.1 ----- LVOT/AV 4 Area index 0.3 cm^2/m^2 ----- (VTI) 7 Peak vel 0.1 ----- ratio, 8 LVOT/AV Area index 0.4 cm^2/m^2 ----- (Vmax) 7 Mitral valve Peak E  vel 72. cm/s ----- 3 Peak A vel 104 cm/s ----- Decelerati 236 ms 150-2 on time 30 Peak 2 mm Hg ----- gradient, D Peak E/A 0.7 ----- ratio Tricuspid valve Regurg 216 cm/s ----- peak vel Peak RV-RA 19 mm Hg ----- gradient, S Systemic veins Estimated 5 mm Hg ----- CVP Right ventricle Pressure, 24 mm Hg <30 S Sa vel, 10. cm/s ----- lat ann, 2 tiss DP  ASSESSMENT AND PLAN: 1. Severe aortic stenosis. The patient continues to have minimal symptoms if any. Will continue close observation and see him back in followup in 6 months. I have personally reviewed his echo images from this morning study. His valve is very severely calcified and visually appear severely stenotic. His valve gradients remain borderline with a mean gradient of just under 40 mm mercury. LV function is moderately depressed with inferoposterior akinesis, this is unchanged from previous studies. The patient sees Dr. Cornelius Moras back next month for followup.  2. Paroxysmal atrial fibrillation. He remains on amiodarone. Will check thyroid studies and LFTs for monitoring. He remains in sinus rhythm.  3. CAD status post CABG. Remains stable with no anginal symptoms. Patent grafts noted at catheterization last year.  4. Essential hypertension. Stable on current medical program.  [name] 01/07/2013 12:01 PM

## 2013-01-09 ENCOUNTER — Telehealth: Payer: Self-pay | Admitting: Cardiovascular Disease

## 2013-01-09 ENCOUNTER — Other Ambulatory Visit: Payer: Self-pay | Admitting: *Deleted

## 2013-01-09 DIAGNOSIS — I350 Nonrheumatic aortic (valve) stenosis: Secondary | ICD-10-CM

## 2013-01-09 MED ORDER — OLMESARTAN MEDOXOMIL 40 MG PO TABS: 40.0000 mg | ORAL_TABLET | Freq: Every day | ORAL | Status: DC

## 2013-01-09 NOTE — Telephone Encounter (Signed)
New problem:  benicar 40 mg   [facility] [phone]

## 2013-01-23 ENCOUNTER — Encounter: Payer: Self-pay | Admitting: Internal Medicine

## 2013-01-23 ENCOUNTER — Ambulatory Visit (INDEPENDENT_AMBULATORY_CARE_PROVIDER_SITE_OTHER): Payer: Medicare Other | Admitting: Internal Medicine

## 2013-01-23 VITALS — BP 114/70 | HR 65 | Temp 97.7°F | Resp 10 | Wt 140.0 lb

## 2013-01-23 DIAGNOSIS — M7989 Other specified soft tissue disorders: Secondary | ICD-10-CM

## 2013-01-23 DIAGNOSIS — M25461 Effusion, right knee: Secondary | ICD-10-CM

## 2013-01-23 DIAGNOSIS — M25469 Effusion, unspecified knee: Secondary | ICD-10-CM

## 2013-01-26 NOTE — Progress Notes (Signed)
  Subjective:    Patient ID: Jesus [name], male    DOB: Jul 07, 1941, 71 y.o.   MRN: [id number]  HPI [name] presents acutely for swelling in the right leg at the knee and proximally. He had been leaf-blowing a lot and then noticed the swelling and inability to flex the knee fully. He is worried about a clot.  He has had no SOB, panic or chest pain. He is for follow up with Dr. Cornelius Moras in regard to his aortic stenosis.  PMH, FamHx and SocHx reviewed for any changes and relevance.  Current Outpatient Prescriptions on File Prior to Visit  Medication Sig Dispense Refill  . amiodarone (PACERONE) 200 MG tablet Take 0.5 tablets (100 mg total) by mouth daily.  30 tablet  6  . amLODipine (NORVASC) 5 MG tablet Take 1 tablet (5 mg total) by mouth daily.  30 tablet  6  . Ascorbic Acid (VITAMIN C) 1000 MG tablet Take 1,000 mg by mouth daily.        [name] aspirin 81 MG tablet Take 81 mg by mouth daily.        [name] atorvastatin (LIPITOR) 80 MG tablet Take 0.5 tablets (40 mg total) by mouth daily.  45 tablet  3  . loratadine (CLARITIN) 10 MG tablet Take 10 mg by mouth daily as needed.        . Multiple Vitamins-Minerals (MULTIVITAMIN,TX-MINERALS) tablet Take 1 tablet by mouth daily.        [name] olmesartan (BENICAR) 40 MG tablet Take 1 tablet (40 mg total) by mouth daily.  30 tablet  5  . pantoprazole (PROTONIX) 40 MG tablet Take 1 tablet (40 mg total) by mouth daily.  30 tablet  3  . warfarin (COUMADIN) 5 MG tablet Take 1 tablet (5 mg total) by mouth as directed. Take as directed by coumadin clinic.  30 tablet  2   No current facility-administered medications on file prior to visit.      Review of Systems System review is negative for any constitutional, cardiac, pulmonary, GI or neuro symptoms or complaints other than as described in the HPI.     Objective:   Physical Exam Filed Vitals:   01/23/13 1636  BP: 114/70  Pulse: 65  Temp: 97.7 F (36.5 C)  Resp: 10   Gen'l - WNWD small framed white man  in no distress HEENT- C&S clear PUlm - normal respirations Ext - swelling noted at the right knee. Right calve and thigh appear normal  Bedside U/S by Dr. Posey Rea - patent femoral vein which is compressible. Patet popliteal vein which is compressible. Low probability of DVT       Assessment & Plan:  Effusion Knee, right - swelling is most likely an effusion due to over use.  Plan Elevate leg  Heat  NSAID

## 2013-02-05 NOTE — Progress Notes (Signed)
Procedure Note :    Procedure :   Point of care (POC) sonography examination (req by Dr Debby Bud)   Indication: r/o DVT RLE   Equipment used: Sonosite M-Turbo with HFL38x/13-6 MHz transducer linear probe. The images were stored in the unit and later transferred in storage.  The patient was placed in a decubitus position.  This RLE compression study study revealed patent veins in the R groin/prox thigh and R popleteal regions. Dr Debby Bud is aware of the results.   Impression: Limited POC study is negative for DVT in the RLE

## 2013-02-06 ENCOUNTER — Ambulatory Visit (INDEPENDENT_AMBULATORY_CARE_PROVIDER_SITE_OTHER): Payer: Medicare Other | Admitting: Internal Medicine

## 2013-02-06 ENCOUNTER — Encounter: Payer: Self-pay | Admitting: Internal Medicine

## 2013-02-06 VITALS — BP 144/76 | HR 59 | Temp 98.5°F | Resp 12 | Wt 138.0 lb

## 2013-02-06 DIAGNOSIS — J069 Acute upper respiratory infection, unspecified: Secondary | ICD-10-CM

## 2013-02-06 MED ORDER — BENZONATATE 100 MG PO CAPS: 100.0000 mg | ORAL_CAPSULE | Freq: Three times a day (TID) | ORAL | Status: DC | PRN

## 2013-02-06 NOTE — Patient Instructions (Addendum)
Upper respiratory infection - viral in nature with no fever, thus no indication for antibiotics.  Plan For the cough: robitussin DM every 4-6 hours; tessalon perles for the tickle three times a day  For aches and pains Tyelnol (generic) 1,000 mg ( 2 extra strength) three times a day on schedule  Hydrate  Vitamin C 1500 mg daily; ecchinacea - over the counter herbal medication to take as directed  Call for fever; dark bitter metallic tasting sputum; shortness of breath.    Upper Respiratory Infection, Adult An upper respiratory infection (URI) is also sometimes known as the common cold. The upper respiratory tract includes the nose, sinuses, throat, trachea, and bronchi. Bronchi are the airways leading to the lungs. Most people improve within 1 week, but symptoms can last up to 2 weeks. A residual cough may last even longer.  CAUSES Many different viruses can infect the tissues lining the upper respiratory tract. The tissues become irritated and inflamed and often become very moist. Mucus production is also common. A cold is contagious. You can easily spread the virus to others by oral contact. This includes kissing, sharing a glass, coughing, or sneezing. Touching your mouth or nose and then touching a surface, which is then touched by another person, can also spread the virus. SYMPTOMS  Symptoms typically develop 1 to 3 days after you come in contact with a cold virus. Symptoms vary from person to person. They may include:  Runny nose.  Sneezing.  Nasal congestion.  Sinus irritation.  Sore throat.  Loss of voice (laryngitis).  Cough.  Fatigue.  Muscle aches.  Loss of appetite.  Headache.  Low-grade fever. DIAGNOSIS  You might diagnose your own cold based on familiar symptoms, since most people get a cold 2 to 3 times a year. Your caregiver can confirm this based on your exam. Most importantly, your caregiver can check that your symptoms are not due to another disease such as  strep throat, sinusitis, pneumonia, asthma, or epiglottitis. Blood tests, throat tests, and X-rays are not necessary to diagnose a common cold, but they may sometimes be helpful in excluding other more serious diseases. Your caregiver will decide if any further tests are required. RISKS AND COMPLICATIONS  You may be at risk for a more severe case of the common cold if you smoke cigarettes, have chronic heart disease (such as heart failure) or lung disease (such as asthma), or if you have a weakened immune system. The very young and very old are also at risk for more serious infections. Bacterial sinusitis, middle ear infections, and bacterial pneumonia can complicate the common cold. The common cold can worsen asthma and chronic obstructive pulmonary disease (COPD). Sometimes, these complications can require emergency medical care and may be life-threatening. PREVENTION  The best way to protect against getting a cold is to practice good hygiene. Avoid oral or hand contact with people with cold symptoms. Wash your hands often if contact occurs. There is no clear evidence that vitamin C, vitamin E, echinacea, or exercise reduces the chance of developing a cold. However, it is always recommended to get plenty of rest and practice good nutrition. TREATMENT  Treatment is directed at relieving symptoms. There is no cure. Antibiotics are not effective, because the infection is caused by a virus, not by bacteria. Treatment may include:  Increased fluid intake. Sports drinks offer valuable electrolytes, sugars, and fluids.  Breathing heated mist or steam (vaporizer or shower).  Eating chicken soup or other clear broths, and maintaining  good nutrition.  Getting plenty of rest.  Using gargles or lozenges for comfort.  Controlling fevers with ibuprofen or acetaminophen as directed by your caregiver.  Increasing usage of your inhaler if you have asthma. Zinc gel and zinc lozenges, taken in the first 24 hours  of the common cold, can shorten the duration and lessen the severity of symptoms. Pain medicines may help with fever, muscle aches, and throat pain. A variety of non-prescription medicines are available to treat congestion and runny nose. Your caregiver can make recommendations and may suggest nasal or lung inhalers for other symptoms.  HOME CARE INSTRUCTIONS   Only take over-the-counter or prescription medicines for pain, discomfort, or fever as directed by your caregiver.  Use a warm mist humidifier or inhale steam from a shower to increase air moisture. This may keep secretions moist and make it easier to breathe.  Drink enough water and fluids to keep your urine clear or pale yellow.  Rest as needed.  Return to work when your temperature has returned to normal or as your caregiver advises. You may need to stay home longer to avoid infecting others. You can also use a face mask and careful hand washing to prevent spread of the virus. SEEK MEDICAL CARE IF:   After the first few days, you feel you are getting worse rather than better.  You need your caregiver's advice about medicines to control symptoms.  You develop chills, worsening shortness of breath, or brown or red sputum. These may be signs of pneumonia.  You develop yellow or brown nasal discharge or pain in the face, especially when you bend forward. These may be signs of sinusitis.  You develop a fever, swollen neck glands, pain with swallowing, or white areas in the back of your throat. These may be signs of strep throat. SEEK IMMEDIATE MEDICAL CARE IF:   You have a fever.  You develop severe or persistent headache, ear pain, sinus pain, or chest pain.  You develop wheezing, a prolonged cough, cough up blood, or have a change in your usual mucus (if you have chronic lung disease).  You develop sore muscles or a stiff neck. Document Released: 05/17/2001 Document Revised: 02/13/2012 Document Reviewed: 03/25/2011 [facility]  Patient Information 2013 [place], [place].

## 2013-02-06 NOTE — Progress Notes (Signed)
  Subjective:    Patient ID: Jesus [name], male    DOB: September 25, 1941, 72 y.o.   MRN: [id number]  HPI Jesus [name] presents with a 48 hr h/o cough productive of clear to yellow sputum with occasional speck of blood. He has not had any fever, rigors, otalgia, no odynophagia, no sore throat, no SOB. He has been PUNY - sleeping and covering up with a quilt.   PMH, FamHx and SocHx reviewed for any changes and relevance. Current Outpatient Prescriptions on File Prior to Visit  Medication Sig Dispense Refill  . amiodarone (PACERONE) 200 MG tablet Take 0.5 tablets (100 mg total) by mouth daily.  30 tablet  6  . amLODipine (NORVASC) 5 MG tablet Take 1 tablet (5 mg total) by mouth daily.  30 tablet  6  . Ascorbic Acid (VITAMIN C) 1000 MG tablet Take 1,000 mg by mouth daily.        [name] aspirin 81 MG tablet Take 81 mg by mouth daily.        [name] atorvastatin (LIPITOR) 80 MG tablet Take 0.5 tablets (40 mg total) by mouth daily.  45 tablet  3  . loratadine (CLARITIN) 10 MG tablet Take 10 mg by mouth daily as needed.        . Multiple Vitamins-Minerals (MULTIVITAMIN,TX-MINERALS) tablet Take 1 tablet by mouth daily.        [name] olmesartan (BENICAR) 40 MG tablet Take 1 tablet (40 mg total) by mouth daily.  30 tablet  5  . pantoprazole (PROTONIX) 40 MG tablet Take 1 tablet (40 mg total) by mouth daily.  30 tablet  3  . warfarin (COUMADIN) 5 MG tablet Take 1 tablet (5 mg total) by mouth as directed. Take as directed by coumadin clinic.  30 tablet  2   No current facility-administered medications on file prior to visit.      Review of Systems System review is negative for any constitutional, cardiac, pulmonary, GI or neuro symptoms or complaints other than as described in the HPI.     Objective:   Physical Exam Filed Vitals:   02/06/13 1638  BP: 144/76  Pulse: 59  Temp: 98.5 F (36.9 C)  Resp: 12   HEENT- no sinus tenderness, TMs normal, throat clear Nodes - no adenopathy Cor- RRR IV/VI high pitched  bird song like holosystolic murmur at RSB Pulm - CTAP, no increased WOB Neuro - A&O x 3       Assessment & Plan:  Upper respiratory infection - viral in nature with no fever, thus no indication for antibiotics.  Plan For the cough: robitussin DM every 4-6 hours; tessalon perles for the tickle three times a day  For aches and pains Tyelnol (generic) 1,000 mg ( 2 extra strength) three times a day on schedule  Hydrate  Vitamin C 1500 mg daily; ecchinacea - over the counter herbal medication to take as directed  Call for fever; dark bitter metallic tasting sputum; shortness of breath.

## 2013-02-11 ENCOUNTER — Encounter: Payer: Self-pay | Admitting: Thoracic Surgery (Cardiothoracic Vascular Surgery)

## 2013-02-11 ENCOUNTER — Ambulatory Visit (INDEPENDENT_AMBULATORY_CARE_PROVIDER_SITE_OTHER): Payer: Medicare Other | Admitting: Thoracic Surgery (Cardiothoracic Vascular Surgery)

## 2013-02-11 ENCOUNTER — Ambulatory Visit (INDEPENDENT_AMBULATORY_CARE_PROVIDER_SITE_OTHER): Payer: Medicare Other | Admitting: Internal Medicine

## 2013-02-11 ENCOUNTER — Encounter: Payer: Self-pay | Admitting: Internal Medicine

## 2013-02-11 VITALS — BP 128/70 | HR 70 | Resp 20 | Ht 62.0 in | Wt 138.0 lb

## 2013-02-11 VITALS — BP 112/60 | HR 65 | Temp 98.3°F | Wt 139.0 lb

## 2013-02-11 DIAGNOSIS — J45909 Unspecified asthma, uncomplicated: Secondary | ICD-10-CM

## 2013-02-11 DIAGNOSIS — I519 Heart disease, unspecified: Secondary | ICD-10-CM

## 2013-02-11 DIAGNOSIS — I359 Nonrheumatic aortic valve disorder, unspecified: Secondary | ICD-10-CM

## 2013-02-11 DIAGNOSIS — I2510 Atherosclerotic heart disease of native coronary artery without angina pectoris: Secondary | ICD-10-CM

## 2013-02-11 DIAGNOSIS — Z951 Presence of aortocoronary bypass graft: Secondary | ICD-10-CM

## 2013-02-11 DIAGNOSIS — I350 Nonrheumatic aortic (valve) stenosis: Secondary | ICD-10-CM

## 2013-02-11 NOTE — Patient Instructions (Signed)
Call and return sooner if you develop increased shortness of breath, chest pain or dizziness

## 2013-02-11 NOTE — Progress Notes (Signed)
[address]            [name] [address]          [phone]     CARDIOTHORACIC SURGERY OFFICE NOTE  Referring Provider is [name], MD PCP is Illene Regulus, MD   HPI:  Patient returns for followup of severe aortic stenosis. He has long-standing history of coronary artery disease with ischemic cardiomyopathy, and he underwent coronary artery bypass grafting x8 by Dr. Andrey Campanile in 1997. He has developed aortic stenosis that has progressed on followup echocardiograms, and he was referred for surgical consultation last fall after repeat echocardiogram demonstrated that his aortic stenosis had progressed to the point of becoming severe with peak velocity across the aortic valve approximately 4 m/s and mean transvalvular gradients approaching 40 mm mercury. Catheterization performed at that time confirming the presence of severe aortic stenosis with mean gradient measured 44 mm mercury and aortic valve area estimated 0.9 cm. All the patient's previous bypass graft remained patent. At that time the patient has remained remarkably asymptomatic, and because of this we decided to continue to follow him closely. He was seen recently in followup by Dr. Excell Seltzer who notes that the patient continues to do very well.  He remains active physically and exercises 4 days a week. He has not developed any symptoms of shortness of breath nor has he perceived any decrease in his exercise tolerance. He has never had any chest pain or chest tightness. He denies PND, orthopnea, or lower extremity edema. She's never had any dizzy spells nor syncope.  Repeat echocardiogram remains unchanged with stable left ventricular size and systolic function and no significant change in the peak velocity across the aortic valve or the estimated valve gradient. Patient returns for office for followup as well. He does note that last week he developed an upper respiratory tract infection associated with  purulent sputum production and cough and wheezing. He was seen initially by Dr. Debby Bud in the office, and this past weekend he went to urgent care in [place] where a chest x-ray was performed and he was given a prescription for antibiotics for presumed tracheobronchitis.  His symptoms have improved.   Current Outpatient Prescriptions  Medication Sig Dispense Refill  . amiodarone (PACERONE) 200 MG tablet Take 0.5 tablets (100 mg total) by mouth daily.  30 tablet  6  . amLODipine (NORVASC) 5 MG tablet Take 1 tablet (5 mg total) by mouth daily.  30 tablet  6  . Ascorbic Acid (VITAMIN C) 1000 MG tablet Take 1,000 mg by mouth daily.        [name] aspirin 81 MG tablet Take 81 mg by mouth daily.        [name] atorvastatin (LIPITOR) 80 MG tablet Take 0.5 tablets (40 mg total) by mouth daily.  45 tablet  3  . benzonatate (TESSALON) 100 MG capsule Take 1 capsule (100 mg total) by mouth 3 (three) times daily as needed for cough.  30 capsule  2  . cefUROXime (CEFTIN) 500 MG tablet Take 500 mg by mouth 2 (two) times daily.       [name] loratadine (CLARITIN) 10 MG tablet Take 10 mg by mouth daily as needed.        . Multiple Vitamins-Minerals (MULTIVITAMIN,TX-MINERALS) tablet Take 1 tablet by mouth daily.        [name] olmesartan (BENICAR) 40 MG tablet Take 1 tablet (  40 mg total) by mouth daily.  30 tablet  5  . pantoprazole (PROTONIX) 40 MG tablet Take 1 tablet (40 mg total) by mouth daily.  30 tablet  3  . PROAIR HFA 108 (90 BASE) MCG/ACT inhaler Inhale 2 puffs into the lungs every 4 (four) hours as needed for wheezing or shortness of breath.       . warfarin (COUMADIN) 5 MG tablet Take 1 tablet (5 mg total) by mouth as directed. Take as directed by coumadin clinic.  30 tablet  2   No current facility-administered medications for this visit.      Physical Exam:   BP 128/70  Pulse 70  Resp 20  Ht 5\' 2"  (1.575 m)  Wt 138 lb (62.596 kg)  BMI 25.23 kg/m2  SpO2 98%  General:  Well-appearing   Chest:   Clear to  auscultation with scattered rhonchi  CV:   Regular rate and rhythm with loud crescendo decrescendo systolic murmur heard along the sternal border  Incisions:  n/a  Abdomen:  Soft and nontender  Extremities:  Warm and well-perfused  Diagnostic Tests:  Transthoracic Echocardiography  Patient: Jesus, [name] MR #: [id number] Study Date: 07/09/2012 Gender: M Age: 72 Height: 157.5cm Weight: 62.1kg BSA: 1.56m^2 Pt. Status: Room:  ATTENDING Janan Halter, Randa Evens PERFORMING [facility], [facility] SONOGRAPHER Philomena Course, RDCS cc:  ------------------------------------------------------------ LV EF: 35% - 40%  ------------------------------------------------------------ Indications: Aortic stenosis 424.1.  ------------------------------------------------------------ History: PMH: Acquired from the patient and from the patient's chart. Paroxysmal Atrial fibrillation. Coronary artery disease. Cardiomyopathy. Moderate aortic stenosis. Risk factors: Hypertension. Dyslipidemia.  ------------------------------------------------------------ Study Conclusions  - Left ventricle: The cavity size was normal. Wall thickness was increased in a pattern of mild LVH. Systolic function was moderately reduced. The estimated ejection fraction was in the range of 35% to 40%. Diffuse hypokinesis. - Aortic valve: The valve is calcified, There is severe aortic stenosis. Peak velocity: 407cm/s (S). Mean gradient: 38mm Hg (S). Peak gradient: 66mm Hg (S). - Mitral valve: Calcified annulus. - Left atrium: The atrium was moderately dilated. - Right ventricle: The cavity size was mildly dilated. Systolic function was mildly reduced. - Right atrium: The atrium was mildly dilated. - Impressions: The aortic stenosis may be a little worse this year. Impressions:  - The aortic stenosis may be a little worse this  year.  ------------------------------------------------------------ Labs, prior tests, procedures, and surgery: Coronary artery bypass grafting (1997).  Transthoracic echocardiography. M-mode, complete 2D, spectral Doppler, and color Doppler. Height: Height: 157.5cm. Height: 62in. Weight: Weight: 62.1kg. Weight: 136.7lb. Body mass index: BMI: 25.1kg/m^2. Body surface area: BSA: 1.57m^2. Blood pressure: 122/68. Patient status: Outpatient. Location: [facility]  ------------------------------------------------------------  ------------------------------------------------------------ Left ventricle: The cavity size was normal. Wall thickness was increased in a pattern of mild LVH. Systolic function was moderately reduced. The estimated ejection fraction was in the range of 35% to 40%. Diffuse hypokinesis.  ------------------------------------------------------------ Aortic valve: The valve is calcified, There is severe aortic stenosis. Doppler: VTI ratio of LVOT to aortic valve: 0.19. Indexed valve area: 0.49cm^2/m^2 (VTI). Peak velocity ratio of LVOT to aortic valve: 0.2. Indexed valve area: 0.5cm^2/m^2 (Vmax). Mean gradient: 38mm Hg (S). Peak gradient: 66mm Hg (S).  ------------------------------------------------------------ Aorta: Aortic root: The aortic root was normal in size.  ------------------------------------------------------------ Mitral valve: Calcified annulus. Doppler: Trivial regurgitation. Peak gradient: 4mm Hg (D).  ------------------------------------------------------------ Left atrium: The atrium was moderately dilated.  ------------------------------------------------------------ Right ventricle: The cavity size was mildly dilated. Systolic function  was mildly reduced.  ------------------------------------------------------------ Pulmonic valve: The valve appears to be grossly normal. Doppler: No significant  regurgitation.  ------------------------------------------------------------ Tricuspid valve: The valve appears to be grossly normal. Doppler: Mild regurgitation.  ------------------------------------------------------------ Right atrium: The atrium was mildly dilated.  ------------------------------------------------------------ Pericardium: There was no pericardial effusion.  ------------------------------------------------------------  2D measurements Normal Doppler measurements Norma Left ventricle l LVID ED, 42.7 mm 43-52 Main pulmonary artery chord, Pressure, 26 mm Hg =30 PLAX S LVID ES, 31.7 mm 23-38 Left ventricle chord, Ea, lat 10. cm/s ----- PLAX ann, tiss 3 FS, chord, 26 % >29 DP PLAX E/Ea, lat 10. ----- LVPW, ED 10.5 mm ------ ann, tiss 19 IVS/LVPW 0.95 <1.3 DP ratio, ED Ea, med 5.7 cm/s ----- Ventricular septum ann, tiss IVS, ED 10 mm ------ DP LVOT E/Ea, med 18. ----- Diam, S 23 mm ------ ann, tiss 42 Area 4.15 cm^2 ------ DP Diam 23 mm ------ LVOT Aorta Peak vel, 80. cm/s ----- Root diam, 32 mm ------ S 5 ED VTI, S 20. cm ----- Left atrium 6 AP dim 47 mm ------ HR 41 bpm ----- AP dim 2.88 cm/m^2 <2.2 Stroke vol 85. ml ----- index 6 Cardiac 3.5 L/min ----- output Cardiac 2.2 L/(min-m ----- index ^2) Stroke 52. ml/m^2 ----- index 5 Aortic valve Peak vel, 407 cm/s ----- S Mean vel, 286 cm/s ----- S VTI, S 107 cm ----- Mean 38 mm Hg ----- gradient, S Peak 66 mm Hg ----- gradient, S VTI ratio 0.1 ----- LVOT/AV 9 Area index 0.4 cm^2/m^2 ----- (VTI) 9 Peak vel 0.2 ----- ratio, LVOT/AV Area index 0.5 cm^2/m^2 ----- (Vmax) Mitral valve Peak E vel 105 cm/s ----- Peak A vel 99. cm/s ----- 7 Decelerati 215 ms 150-2 on time 30 Peak 4 mm Hg ----- gradient, D Peak E/A 1.1 ----- ratio Tricuspid valve Regurg 229 cm/s ----- peak vel Peak RV-RA 21 mm Hg ----- gradient, S Systemic veins Estimated 5 mm Hg ----- CVP Right  ventricle Pressure, 26 mm Hg <30 S Sa vel, 10. cm/s ----- lat ann, 9 tiss DP  ------------------------------------------------------------ Prepared and Electronically Authenticated by  Willa Rough 2013-08-05T18:27:17.323   Transthoracic Echocardiography  Patient: Jesus, [name] MR #: [id number] Study Date: 01/07/2013 Gender: M Age: 1 Height: 157.5cm Weight: 61.2kg BSA: 1.55m^2 Pt. Status: Room:  ATTENDING Nahser, Joneen Boers PERFORMING [facility], [facility] SONOGRAPHER Philomena Course, RDCS cc:  ------------------------------------------------------------ LV EF: 45% - 50%  ------------------------------------------------------------ Indications: Aortic stenosis 424.1.  ------------------------------------------------------------ History: PMH: Acquired from the patient and from the patient's chart. Atrial fibrillation. Coronary artery disease. Cardiomyopathy. Severe aortic stenosis. PMH: Myocardial infarction. Risk factors: Hypertension. Dyslipidemia.  ------------------------------------------------------------ Study Conclusions  - Left ventricle: The cavity size was normal. Wall thickness was increased in a pattern of mild LVH. Systolic function was mildly reduced. The estimated ejection fraction was in the range of 45% to 50%. Hypokinesis of the entire myocardium. Akinesis of the basal-midinferior myocardium. Doppler parameters are consistent with abnormal left ventricular relaxation (grade 1 diastolic dysfunction). - Aortic valve: There was severe stenosis. - Left atrium: The atrium was mildly to moderately dilated. - Right ventricle: Systolic function was mildly reduced. - Atrial septum: No defect or patent foramen ovale was identified.  ------------------------------------------------------------ Labs, prior tests, procedures, and surgery: Coronary artery bypass grafting.  Transthoracic  echocardiography. M-mode, complete 2D, spectral Doppler, and color Doppler. Height: Height: 157.5cm. Height: 62in. Weight: Weight: 61.2kg. Weight: 134.7lb. Body mass index: BMI: 24.7kg/m^2. Body surface area: BSA: 1.54m^2. Blood pressure: 125/64.  Patient status: Outpatient. Location: [facility]  ------------------------------------------------------------  ------------------------------------------------------------ Left ventricle: The cavity size was normal. Wall thickness was increased in a pattern of mild LVH. Systolic function was mildly reduced. The estimated ejection fraction was in the range of 45% to 50%. Regional wall motion abnormalities: Hypokinesis of the entire myocardium. Akinesis of the basal-midinferior myocardium. Doppler parameters are consistent with abnormal left ventricular relaxation (grade 1 diastolic dysfunction).  ------------------------------------------------------------ Aortic valve: Trileaflet. Doppler: There was severe stenosis. No regurgitation. VTI ratio of LVOT to aortic valve: 0.14. Indexed valve area: 0.37cm^2/m^2 (VTI). Peak velocity ratio of LVOT to aortic valve: 0.18. Indexed valve area: 0.47cm^2/m^2 (Vmax). Mean gradient: 39mm Hg (S). Peak gradient: 67mm Hg (S).  ------------------------------------------------------------ Aorta: The aorta was normal, not dilated, and non-diseased. Aortic root: The aortic root was normal in size. Ascending aorta: The ascending aorta was normal in size.  ------------------------------------------------------------ Mitral valve: Structurally normal valve. Leaflet separation was normal. Doppler: Transvalvular velocity was within the normal range. There was no evidence for stenosis. Trivial regurgitation. Peak gradient: 2mm Hg (D).  ------------------------------------------------------------ Left atrium: The atrium was mildly to moderately  dilated.  ------------------------------------------------------------ Atrial septum: No defect or patent foramen ovale was identified.  ------------------------------------------------------------ Right ventricle: The cavity size was normal. Wall thickness was normal. Systolic function was mildly reduced.  ------------------------------------------------------------ Pulmonic valve: Structurally normal valve. Cusp separation was normal. Doppler: Transvalvular velocity was within the normal range. No significant regurgitation.  ------------------------------------------------------------ Tricuspid valve: Structurally normal valve. Leaflet separation was normal. Doppler: Transvalvular velocity was within the normal range. Trivial regurgitation.  ------------------------------------------------------------ Pulmonary artery: The main pulmonary artery was normal-sized.  ------------------------------------------------------------ Right atrium: The atrium was normal in size.  ------------------------------------------------------------ Pericardium: The pericardium was normal in appearance.  ------------------------------------------------------------ Systemic veins: Inferior vena cava: The vessel was normal in size; the respirophasic diameter changes were in the normal range (= 50%); findings are consistent with normal central venous pressure.  ------------------------------------------------------------ Post procedure conclusions Ascending Aorta:  - The aorta was normal, not dilated, and non-diseased.  ------------------------------------------------------------  2D measurements Normal Doppler measurements Norma Left ventricle l LVID ED, 43 mm 43-52 Main pulmonary artery chord, Pressure, 24 mm Hg =30 PLAX S LVID ES, 32.1 mm 23-38 Left ventricle chord, Ea, lat 9.4 cm/s ----- PLAX ann, tiss 3 FS, chord, 25 % >29 DP PLAX E/Ea, lat 7.6 ----- LVPW, ED 12.1 mm ------ ann,  tiss 7 IVS/LVPW 1.03 <1.3 DP ratio, ED Ea, med 6.6 cm/s ----- Ventricular septum ann, tiss 9 IVS, ED 12.5 mm ------ DP LVOT E/Ea, med 10. ----- Diam, S 23 mm ------ ann, tiss 81 Area 4.15 cm^2 ------ DP Diam 23 mm ------ LVOT Aorta Peak vel, 74. cm/s ----- Root diam, 32 mm ------ S 5 ED VTI, S 16. cm ----- Left atrium 5 AP dim 50 mm ------ HR 55 bpm ----- AP dim 3.09 cm/m^2 <2.2 Stroke vol 68. ml ----- index 6 Cardiac 3.8 L/min ----- output Cardiac 2.3 L/(min-m ----- index ^2) Stroke 42. ml/m^2 ----- index 3 Aortic valve Peak vel, 409 cm/s ----- S Mean vel, 289 cm/s ----- S VTI, S 114 cm ----- Mean 39 mm Hg ----- gradient, S Peak 67 mm Hg ----- gradient, S VTI ratio 0.1 ----- LVOT/AV 4 Area index 0.3 cm^2/m^2 ----- (VTI) 7 Peak vel 0.1 ----- ratio, 8 LVOT/AV Area index 0.4 cm^2/m^2 ----- (Vmax) 7 Mitral valve Peak E vel 72. cm/s ----- 3 Peak A vel 104 cm/s ----- Decelerati 236 ms 150-2 on time 30 Peak 2  mm Hg ----- gradient, D Peak E/A 0.7 ----- ratio Tricuspid valve Regurg 216 cm/s ----- peak vel Peak RV-RA 19 mm Hg ----- gradient, S Systemic veins Estimated 5 mm Hg ----- CVP Right ventricle Pressure, 24 mm Hg <30 S Sa vel, 10. cm/s ----- lat ann, 2 tiss DP  ------------------------------------------------------------ Prepared and Electronically Authenticated by  Kristeen Miss 2014-02-03T13:12:21.390         Impression:  The patient has severe aortic stenosis with underlying history of ischemic cardiomyopathy and three-vessel coronary artery disease, status post coronary artery bypass grafting in the distant past. At the time of last catheterization is bypass graft remained patent and free of significant disease. The patient remains remarkably asymptomatic.  I agree that it seems reasonable to continue to follow the patient closely for the time being.   Plan:  The patient will continue to followup with Dr. Excell Seltzer every 6 months  with repeat echocardiograms. We will plan to see him back again in one year to see how he is getting along. He has been counseled regarding the types of symptoms to be concerned about, specifically with respect to the development of any exertional shortness of breath, chest discomfort, or dizzy spells. All of his questions been addressed.   Salvatore Decent. Cornelius Moras, MD 02/11/2013 1:46 PM

## 2013-02-11 NOTE — Progress Notes (Signed)
Subjective:    Patient ID: Jesus [name], male    DOB: [birth year], 72 y.o.   MRN: [id number]  HPI Patient seen 02/06/13 - note reviewed: no fever, no chills, no productive sputum. Dx- viral URI. Saturday March 8th he went to urgent care for increased sputum production, wheezing but no fever and sputum was dark yellow. He had an O2 sat 96%. He had a chest x-ray: no evidence of pneumonia, it was clear. He was treated with ceftin 500 mg bid for 10 days and was given a ProAir MDI to use prn wheezing.   Today he is feeling better. He used ProAir x 1. He has tolerated the ceftin.   Past Medical History  Diagnosis Date  . Esophageal reflux   . Atrial fibrillation     holding sinus rhythm on Amiodarone  . Aortic stenosis     mild with a mean aortic valve gradient of 12 mmHg  . CAD (coronary artery disease) 1997    CABG  . Unspecified essential hypertension   . History of transesophageal echocardiography (TEE) for monitoring   . Cardiomyopathy   . Other and unspecified hyperlipidemia   . Skin lesions, generalized     facial which may represent actinic keratoses and possible photosensitivity from Amiodarone  . GERD (gastroesophageal reflux disease)   . History of colonoscopy   . 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 Acute myocardial infarction of inferior wall 1980   Past Surgical History  Procedure Laterality Date  . Tonsillectomy    . Coronary artery bypass graft  02/09/1996    LIMA to diag-LAD, SVG to OM1-OM2-OM3, SVG to [facility]  . Cholecystectomy    . Cardioversion  11/18/2006    Dr. Jacklynn Bue  . Cataract extraction w/ intraocular lens implant  April '13  (Dr. Dagoberto Ligas)    left eye only   Family History  Problem Relation Age of Onset  . Stroke Mother 42    Deceased  . Heart attack Father 70    Deceased  . Atrial fibrillation Brother   . Crohn's disease  Mother     Deceased  . Heart disease Father   . Heart disease Paternal Uncle   . Prostate cancer Paternal Uncle   . Heart failure Father     Deceased   History   Social History  . Marital Status: Married    Spouse Name: N/A    Number of Children: N/A  . Years of Education: N/A   Occupational History  . Not on file.   Social History Main Topics  . Smoking status: Never Smoker   . Smokeless tobacco: Never Used     Comment: Does not smoke.  [name] Alcohol Use: No  . Drug Use: No  . Sexually Active: Not Currently   Other Topics Concern  . Not on file   Social History Narrative   HSG. [employer] - [employer] 6 years. Married - '69 - 1 year/divorced. '76 - . No children. Work - mfg/textiles - Audiological scientist; currently works doing maintenance. [employer] - they have discussed this. Provided packet august '13.                 Current Outpatient Prescriptions on File Prior to Visit  Medication Sig Dispense Refill  . amiodarone (PACERONE) 200 MG tablet Take 0.5 tablets (100 mg total) by mouth daily.  30  tablet  6  . amLODipine (NORVASC) 5 MG tablet Take 1 tablet (5 mg total) by mouth daily.  30 tablet  6  . Ascorbic Acid (VITAMIN C) 1000 MG tablet Take 1,000 mg by mouth daily.        [name] aspirin 81 MG tablet Take 81 mg by mouth daily.        [name] atorvastatin (LIPITOR) 80 MG tablet Take 0.5 tablets (40 mg total) by mouth daily.  45 tablet  3  . benzonatate (TESSALON) 100 MG capsule Take 1 capsule (100 mg total) by mouth 3 (three) times daily as needed for cough.  30 capsule  2  . cefUROXime (CEFTIN) 500 MG tablet Take 500 mg by mouth 2 (two) times daily.       [name] loratadine (CLARITIN) 10 MG tablet Take 10 mg by mouth daily as needed.        . Multiple Vitamins-Minerals (MULTIVITAMIN,TX-MINERALS) tablet Take 1 tablet by mouth daily.        [name] olmesartan (BENICAR) 40 MG tablet Take 1 tablet (40 mg total) by mouth daily.  30 tablet  5  . pantoprazole (PROTONIX) 40 MG tablet Take 1 tablet (40 mg  total) by mouth daily.  30 tablet  3  . PROAIR HFA 108 (90 BASE) MCG/ACT inhaler Inhale 2 puffs into the lungs every 4 (four) hours as needed for wheezing or shortness of breath.       . warfarin (COUMADIN) 5 MG tablet Take 1 tablet (5 mg total) by mouth as directed. Take as directed by coumadin clinic.  30 tablet  2   No current facility-administered medications on file prior to visit.     Review of Systems System review is negative for any constitutional, cardiac, pulmonary, GI or neuro symptoms or complaints other than as described in the HPI.     Objective:   Physical Exam Filed Vitals:   02/11/13 1623  BP: 112/60  Pulse: 65  Temp: 98.3 F (36.8 C)   Gen'l - WNWD white man in no distress HEENT_ C&S clear, Neck- supple Nodes - negative for adenopathy in the cervical region Cor - RRR III/VI systolic mm RSB with radiation to LSB Pulm - no increased WOB, no rales or wheezes appreciated.       Assessment & Plan:  URI - patient was seen for viral URI and supportive therapy was recommended. He developed increased sputum production and wheezing suggestive of asthmatic bronchitis with possible super-infection. Seen at urgent care and started on broad-spectrum antibiotics. Over the past 36-48 hours he has seen substantial improvement with a normal exam today.  Plan Complete course of ceftin  Continue ProAir HFA prn but if required more than 3 times a day he will need reevaluation.  Continue supportive care.

## 2013-02-11 NOTE — Patient Instructions (Addendum)
Respiratory infection - good news that there was no evidence of pneumonia. He has not had much wheezing today.  Plan  Finish the antibiotic  Use the ProAir as needed.  Continue the tessalon perle as needed for a tickle cough and the codeine cough syrup at night.

## 2013-02-13 NOTE — Assessment & Plan Note (Signed)
Follow up visit to [facility] - doing well without significant progression of disease - no intervention planned at this time.  Plan - follow up 2 D echo per Dr. Excell Seltzer in 6 months

## 2013-02-19 ENCOUNTER — Ambulatory Visit (INDEPENDENT_AMBULATORY_CARE_PROVIDER_SITE_OTHER): Payer: Medicare Other | Admitting: General Practice

## 2013-02-19 DIAGNOSIS — I4891 Unspecified atrial fibrillation: Secondary | ICD-10-CM

## 2013-02-19 LAB — POCT INR: INR: 2.2

## 2013-03-07 ENCOUNTER — Other Ambulatory Visit: Payer: Self-pay

## 2013-03-07 MED ORDER — WARFARIN SODIUM 5 MG PO TABS
5.0000 mg | ORAL_TABLET | ORAL | Status: DC
Start: 2013-03-07 — End: 2013-08-19

## 2013-03-07 MED ORDER — PANTOPRAZOLE SODIUM 40 MG PO TBEC: 40.0000 mg | DELAYED_RELEASE_TABLET | Freq: Every day | ORAL | Status: DC

## 2013-04-01 ENCOUNTER — Telehealth: Payer: Self-pay | Admitting: *Deleted

## 2013-04-01 ENCOUNTER — Other Ambulatory Visit: Payer: Self-pay | Admitting: *Deleted

## 2013-04-01 DIAGNOSIS — I350 Nonrheumatic aortic (valve) stenosis: Secondary | ICD-10-CM

## 2013-04-01 MED ORDER — OLMESARTAN MEDOXOMIL 40 MG PO TABS: 40.0000 mg | ORAL_TABLET | Freq: Every day | ORAL | Status: DC

## 2013-04-01 NOTE — Telephone Encounter (Signed)
Pt left a vm stating he was very disappointed because he left a vm last Wednesday and never received a call back. Today he is requesting Benicar Samples. Samples are upfront.  Pt's wife informed.

## 2013-04-02 ENCOUNTER — Ambulatory Visit (INDEPENDENT_AMBULATORY_CARE_PROVIDER_SITE_OTHER): Payer: Medicare Other | Admitting: General Practice

## 2013-04-02 DIAGNOSIS — I4891 Unspecified atrial fibrillation: Secondary | ICD-10-CM

## 2013-04-11 ENCOUNTER — Other Ambulatory Visit: Payer: Self-pay | Admitting: *Deleted

## 2013-04-11 MED ORDER — AMIODARONE HCL 200 MG PO TABS: 100.0000 mg | ORAL_TABLET | Freq: Every day | ORAL | Status: DC

## 2013-04-17 ENCOUNTER — Ambulatory Visit (INDEPENDENT_AMBULATORY_CARE_PROVIDER_SITE_OTHER): Payer: Medicare Other | Admitting: General Practice

## 2013-04-17 DIAGNOSIS — I4891 Unspecified atrial fibrillation: Secondary | ICD-10-CM

## 2013-04-17 LAB — POCT INR: INR: 2.2

## 2013-04-24 ENCOUNTER — Ambulatory Visit (INDEPENDENT_AMBULATORY_CARE_PROVIDER_SITE_OTHER): Payer: Medicare Other | Admitting: Internal Medicine

## 2013-04-24 ENCOUNTER — Encounter: Payer: Self-pay | Admitting: Internal Medicine

## 2013-04-24 ENCOUNTER — Ambulatory Visit (INDEPENDENT_AMBULATORY_CARE_PROVIDER_SITE_OTHER)
Admission: RE | Admit: 2013-04-24 | Discharge: 2013-04-24 | Disposition: A | Discharge status: Home / Self Care | Payer: Medicare Other | Source: Ambulatory Visit | Attending: Internal Medicine | Admitting: Internal Medicine

## 2013-04-24 VITALS — BP 112/68 | HR 64 | Temp 97.8°F | Resp 16 | Wt 139.5 lb

## 2013-04-24 DIAGNOSIS — M5441 Lumbago with sciatica, right side: Secondary | ICD-10-CM | POA: Insufficient documentation

## 2013-04-24 DIAGNOSIS — M5430 Sciatica, unspecified side: Secondary | ICD-10-CM

## 2013-04-24 NOTE — Assessment & Plan Note (Signed)
He is getting relief with tylenol so will continue that I will check plain films today

## 2013-04-24 NOTE — Progress Notes (Signed)
Subjective:    Patient ID: Jesus [name], male    DOB: 07-Nov-1941, 72 y.o.   MRN: [id number]  Back Pain This is a recurrent problem. The current episode started more than 1 month ago. The problem has been gradually worsening since onset. The pain is present in the lumbar spine. The quality of the pain is described as aching. The pain radiates to the right thigh. The pain is at a severity of 2/10. The pain is mild. The symptoms are aggravated by position and standing. Associated symptoms include leg pain (diffuse pain in RLE). Pertinent negatives include no abdominal pain, bladder incontinence, bowel incontinence, chest pain, dysuria, fever, headaches, numbness, paresis, paresthesias, pelvic pain, perianal numbness, tingling, weakness or weight loss. Risk factors include recent trauma (he fell 5-6 months ago). He has tried analgesics for the symptoms. The treatment provided moderate relief.      Review of Systems  Constitutional: Negative.  Negative for fever and weight loss.  HENT: Negative.   Eyes: Negative.   Respiratory: Negative.   Cardiovascular: Negative.  Negative for chest pain, palpitations and leg swelling.  Gastrointestinal: Negative.  Negative for nausea, vomiting, abdominal pain, diarrhea, constipation and bowel incontinence.  Endocrine: Negative.   Genitourinary: Negative.  Negative for bladder incontinence, dysuria, hematuria and pelvic pain.  Musculoskeletal: Positive for back pain and arthralgias. Negative for myalgias, joint swelling and gait problem.  Skin: Negative.   Allergic/Immunologic: Negative.   Neurological: Negative.  Negative for tingling, weakness, numbness, headaches and paresthesias.  Hematological: Negative.  Negative for adenopathy. Does not bruise/bleed easily.  Psychiatric/Behavioral: Negative.        Objective:   Physical Exam  Vitals reviewed. Constitutional: He is oriented to person, place, and time. He appears well-developed and well-nourished.  No distress.  HENT:  Head: Normocephalic and atraumatic.  Mouth/Throat: Oropharynx is clear and moist. No oropharyngeal exudate.  Eyes: Conjunctivae are normal. Right eye exhibits no discharge. Left eye exhibits no discharge. No scleral icterus.  Neck: Normal range of motion. Neck supple. No JVD present. No tracheal deviation present. No thyromegaly present.  Cardiovascular: Normal rate, regular rhythm, S1 normal, S2 normal and intact distal pulses.  Exam reveals no gallop and no friction rub.   Murmur heard.  Decrescendo systolic murmur is present with a grade of 2/6   No diastolic murmur is present  Pulses:      Carotid pulses are 1+ on the right side, and 1+ on the left side.      Radial pulses are 1+ on the right side, and 1+ on the left side.       Femoral pulses are 1+ on the right side, and 1+ on the left side.      Popliteal pulses are 1+ on the right side, and 1+ on the left side.       Dorsalis pedis pulses are 1+ on the right side, and 1+ on the left side.       Posterior tibial pulses are 1+ on the right side, and 1+ on the left side.  Pulmonary/Chest: Effort normal and breath sounds normal. No stridor. No respiratory distress. He has no wheezes. He has no rales. He exhibits no tenderness.  Abdominal: Soft. Bowel sounds are normal. He exhibits no distension and no mass. There is no tenderness. There is no rebound and no guarding.  Musculoskeletal: Normal range of motion. He exhibits no edema and no tenderness.       Right hip: Normal. He exhibits normal range of  motion, normal strength, no tenderness, no bony tenderness and no deformity.       Right knee: Normal.       Lumbar back: Normal. He exhibits normal range of motion, no tenderness, no bony tenderness, no swelling, no edema, no deformity, no pain, no spasm and normal pulse.       Right upper leg: Normal. He exhibits no tenderness, no bony tenderness, no swelling, no edema, no deformity and no laceration.  Lymphadenopathy:     He has no cervical adenopathy.  Neurological: He is alert and oriented to person, place, and time. He has normal strength. He displays no atrophy, no tremor and normal reflexes. No cranial nerve deficit or sensory deficit. He exhibits normal muscle tone. He displays a negative Romberg sign. He displays no seizure activity. Coordination and gait normal. He displays no Babinski's sign on the right side. He displays no Babinski's sign on the left side.  Reflex Scores:      Tricep reflexes are 0 on the right side and 0 on the left side.      Bicep reflexes are 0 on the right side and 0 on the left side.      Brachioradialis reflexes are 0 on the right side and 0 on the left side.      Patellar reflexes are 0 on the right side and 0 on the left side.      Achilles reflexes are 0 on the right side and 0 on the left side. Neg SLR in BLE  Skin: Skin is warm and dry. No rash noted. He is not diaphoretic. No erythema. No pallor.  Psychiatric: He has a normal mood and affect. His behavior is normal. Judgment and thought content normal.          Assessment & Plan:

## 2013-04-24 NOTE — Patient Instructions (Signed)
Back Pain, Adult  Low back pain is very common. About 1 in 5 people have back pain. The cause of low back pain is rarely dangerous. The pain often gets better over time. About half of people with a sudden onset of back pain feel better in just 2 weeks. About 8 in 10 people feel better by 6 weeks.   CAUSES  Some common causes of back pain include:  · Strain of the muscles or ligaments supporting the spine.  · Wear and tear (degeneration) of the spinal discs.  · Arthritis.  · Direct injury to the back.  DIAGNOSIS  Most of the time, the direct cause of low back pain is not known. However, back pain can be treated effectively even when the exact cause of the pain is unknown. Answering your caregiver's questions about your overall health and symptoms is one of the most accurate ways to make sure the cause of your pain is not dangerous. If your caregiver needs more information, he or she may order lab work or imaging tests (X-rays or MRIs). However, even if imaging tests show changes in your back, this usually does not require surgery.  HOME CARE INSTRUCTIONS  For many people, back pain returns. Since low back pain is rarely dangerous, it is often a condition that people can learn to manage on their own.   · Remain active. It is stressful on the back to sit or stand in one place. Do not sit, drive, or stand in one place for more than 30 minutes at a time. Take short walks on level surfaces as soon as pain allows. Try to increase the length of time you walk each day.  · Do not stay in bed. Resting more than 1 or 2 days can delay your recovery.  · Do not avoid exercise or work. Your body is made to move. It is not dangerous to be active, even though your back may hurt. Your back will likely heal faster if you return to being active before your pain is gone.  · Pay attention to your body when you  bend and lift. Many people have less discomfort when lifting if they bend their knees, keep the load close to their bodies, and  avoid twisting. Often, the most comfortable positions are those that put less stress on your recovering back.  · Find a comfortable position to sleep. Use a firm mattress and lie on your side with your knees slightly bent. If you lie on your back, put a pillow under your knees.  · Only take over-the-counter or prescription medicines as directed by your caregiver. Over-the-counter medicines to reduce pain and inflammation are often the most helpful. Your caregiver may prescribe muscle relaxant drugs. These medicines help dull your pain so you can more quickly return to your normal activities and healthy exercise.  · Put ice on the injured area.  · Put ice in a plastic bag.  · Place a towel between your skin and the bag.  · Leave the ice on for 15-20 minutes, 3-4 times a day for the first 2 to 3 days. After that, ice and heat may be alternated to reduce pain and spasms.  · Ask your caregiver about trying back exercises and gentle massage. This may be of some benefit.  · Avoid feeling anxious or stressed. Stress increases muscle tension and can worsen back pain. It is important to recognize when you are anxious or stressed and learn ways to manage it. Exercise is a great option.  SEEK MEDICAL CARE IF:  · You have pain that is not relieved with rest or   medicine.  · You have pain that does not improve in 1 week.  · You have new symptoms.  · You are generally not feeling well.  SEEK IMMEDIATE MEDICAL CARE IF:   · You have pain that radiates from your back into your legs.  · You develop new bowel or bladder control problems.  · You have unusual weakness or numbness in your arms or legs.  · You develop nausea or vomiting.  · You develop abdominal pain.  · You feel faint.  Document Released: 11/21/2005 Document Revised: 05/22/2012 Document Reviewed: 04/11/2011  [employer]® Patient Information ©2014 [employer].

## 2013-05-15 ENCOUNTER — Ambulatory Visit (INDEPENDENT_AMBULATORY_CARE_PROVIDER_SITE_OTHER): Payer: Medicare Other | Admitting: Family Medicine

## 2013-05-15 ENCOUNTER — Encounter: Payer: Self-pay | Admitting: Internal Medicine

## 2013-05-15 ENCOUNTER — Ambulatory Visit (INDEPENDENT_AMBULATORY_CARE_PROVIDER_SITE_OTHER): Payer: Medicare Other | Admitting: Internal Medicine

## 2013-05-15 ENCOUNTER — Other Ambulatory Visit: Payer: Self-pay

## 2013-05-15 VITALS — BP 98/60 | HR 73 | Temp 97.2°F | Wt 137.4 lb

## 2013-05-15 DIAGNOSIS — I2510 Atherosclerotic heart disease of native coronary artery without angina pectoris: Secondary | ICD-10-CM

## 2013-05-15 DIAGNOSIS — H6122 Impacted cerumen, left ear: Secondary | ICD-10-CM

## 2013-05-15 DIAGNOSIS — I359 Nonrheumatic aortic valve disorder, unspecified: Secondary | ICD-10-CM

## 2013-05-15 DIAGNOSIS — I4891 Unspecified atrial fibrillation: Secondary | ICD-10-CM

## 2013-05-15 DIAGNOSIS — Z23 Encounter for immunization: Secondary | ICD-10-CM

## 2013-05-15 DIAGNOSIS — H6120 Impacted cerumen, unspecified ear: Secondary | ICD-10-CM

## 2013-05-15 LAB — POCT INR: INR: 2.1

## 2013-05-15 MED ORDER — TETANUS-DIPHTHERIA TOXOIDS TD 5-2 LFU IM INJ: 0.5000 mL | INJECTION | Freq: Once | INTRAMUSCULAR | Status: DC

## 2013-05-16 NOTE — Progress Notes (Signed)
  Subjective:    Patient ID: Jesus [name], male    DOB: Oct 27, 1941, 72 y.o.   MRN: [id number]  HPI Jesus [name] presents for decreased hearing left ear. He thinks he has reaccumulation of wax. He is otherwise doing OK.  PMH, FamHx and SocHx reviewed for any changes and relevance. Current Outpatient Prescriptions on File Prior to Visit  Medication Sig Dispense Refill  . amiodarone (PACERONE) 200 MG tablet Take 0.5 tablets (100 mg total) by mouth daily.  30 tablet  6  . amLODipine (NORVASC) 5 MG tablet Take 1 tablet (5 mg total) by mouth daily.  30 tablet  6  . Ascorbic Acid (VITAMIN C) 1000 MG tablet Take 1,000 mg by mouth daily.        [name] aspirin 81 MG tablet Take 81 mg by mouth daily.        [name] atorvastatin (LIPITOR) 80 MG tablet Take 0.5 tablets (40 mg total) by mouth daily.  45 tablet  3  . benzonatate (TESSALON) 100 MG capsule Take 1 capsule (100 mg total) by mouth 3 (three) times daily as needed for cough.  30 capsule  2  . cefUROXime (CEFTIN) 500 MG tablet Take 500 mg by mouth 2 (two) times daily.       [name] HYDROcodone-homatropine (HYCODAN) 5-1.5 MG/5ML syrup Take 5 mLs by mouth 4 (four) times daily as needed for cough.      . loratadine (CLARITIN) 10 MG tablet Take 10 mg by mouth daily as needed.        . Multiple Vitamins-Minerals (MULTIVITAMIN,TX-MINERALS) tablet Take 1 tablet by mouth daily.        [name] olmesartan (BENICAR) 40 MG tablet Take 1 tablet (40 mg total) by mouth daily.  28 tablet  0  . pantoprazole (PROTONIX) 40 MG tablet Take 1 tablet (40 mg total) by mouth daily.  30 tablet  3  . PROAIR HFA 108 (90 BASE) MCG/ACT inhaler Inhale 2 puffs into the lungs every 4 (four) hours as needed for wheezing or shortness of breath.       . warfarin (COUMADIN) 5 MG tablet Take 1 tablet (5 mg total) by mouth as directed. Take as directed by coumadin clinic.  30 tablet  3   No current facility-administered medications on file prior to visit.      Review of Systems System review is  negative for any constitutional, cardiac, pulmonary, GI or neuro symptoms or complaints other than as described in the HPI.     Objective:   Physical Exam Filed Vitals:   05/15/13 1535  BP: 98/60  Pulse: 73  Temp: 97.2 F (36.2 C)   Gen'l WNWD white man in no distress HEENT- Left EAC with cerumen impaction Cor- RRR Pulm - normal respirations  Ear irrigation - standard irrigation with ear syringe. Wax impaction cleared easily. Patient tolerated this well. Had improved hearing.        Assessment & Plan:  Cerumen impaction left - resolved.   He is due for Tetanus - done.

## 2013-06-12 ENCOUNTER — Ambulatory Visit (INDEPENDENT_AMBULATORY_CARE_PROVIDER_SITE_OTHER): Payer: Medicare Other | Admitting: General Practice

## 2013-06-12 DIAGNOSIS — I4891 Unspecified atrial fibrillation: Secondary | ICD-10-CM

## 2013-06-12 LAB — POCT INR: INR: 2.6

## 2013-07-01 ENCOUNTER — Other Ambulatory Visit: Payer: Self-pay

## 2013-07-01 ENCOUNTER — Telehealth: Payer: Self-pay

## 2013-07-01 MED ORDER — PANTOPRAZOLE SODIUM 40 MG PO TBEC: 40.0000 mg | DELAYED_RELEASE_TABLET | Freq: Every day | ORAL | Status: DC

## 2013-07-01 NOTE — Telephone Encounter (Signed)
Phone call from patient requesting samples of Benicar 40 mg. I let him know they will be at the front office for him.

## 2013-07-03 ENCOUNTER — Other Ambulatory Visit (INDEPENDENT_AMBULATORY_CARE_PROVIDER_SITE_OTHER): Payer: Medicare Other

## 2013-07-03 DIAGNOSIS — I2510 Atherosclerotic heart disease of native coronary artery without angina pectoris: Secondary | ICD-10-CM

## 2013-07-03 DIAGNOSIS — I359 Nonrheumatic aortic valve disorder, unspecified: Secondary | ICD-10-CM

## 2013-07-03 DIAGNOSIS — I4891 Unspecified atrial fibrillation: Secondary | ICD-10-CM

## 2013-07-03 LAB — HEPATIC FUNCTION PANEL
ALT: 21 U/L (ref 0–53)
AST: 23 U/L (ref 0–37)
Alkaline Phosphatase: 100 U/L (ref 39–117)
Bilirubin, Direct: 0.1 mg/dL (ref 0.0–0.3)
Total Bilirubin: 0.8 mg/dL (ref 0.3–1.2)

## 2013-07-03 LAB — CBC WITH DIFFERENTIAL/PLATELET
Basophils Absolute: 0 10*3/uL (ref 0.0–0.1)
Eosinophils Absolute: 0.1 10*3/uL (ref 0.0–0.7)
Lymphocytes Relative: 21.8 % (ref 12.0–46.0)
Lymphs Abs: 1.6 10*3/uL (ref 0.7–4.0)
Monocytes Relative: 9.8 % (ref 3.0–12.0)
Platelets: 128 10*3/uL — ABNORMAL LOW (ref 150.0–400.0)
RDW: 13.6 % (ref 11.5–14.6)

## 2013-07-03 LAB — BASIC METABOLIC PANEL
BUN: 16 mg/dL (ref 6–23)
Calcium: 8.8 mg/dL (ref 8.4–10.5)
GFR: 89.88 mL/min (ref >60.00)
Glucose, Bld: 122 mg/dL — ABNORMAL HIGH (ref 70–99)

## 2013-07-03 LAB — TSH: TSH: 2.34 u[IU]/mL (ref 0.35–5.50)

## 2013-07-08 ENCOUNTER — Other Ambulatory Visit: Payer: Medicare Other

## 2013-07-10 ENCOUNTER — Ambulatory Visit (INDEPENDENT_AMBULATORY_CARE_PROVIDER_SITE_OTHER): Payer: Medicare Other | Admitting: *Deleted

## 2013-07-10 ENCOUNTER — Encounter: Payer: Self-pay | Admitting: Cardiovascular Disease

## 2013-07-10 ENCOUNTER — Ambulatory Visit (INDEPENDENT_AMBULATORY_CARE_PROVIDER_SITE_OTHER): Payer: Medicare Other | Admitting: Cardiovascular Disease

## 2013-07-10 VITALS — BP 120/80 | HR 58 | Ht 63.0 in | Wt 138.0 lb

## 2013-07-10 DIAGNOSIS — I2510 Atherosclerotic heart disease of native coronary artery without angina pectoris: Secondary | ICD-10-CM

## 2013-07-10 DIAGNOSIS — I4891 Unspecified atrial fibrillation: Secondary | ICD-10-CM

## 2013-07-10 DIAGNOSIS — I359 Nonrheumatic aortic valve disorder, unspecified: Secondary | ICD-10-CM

## 2013-07-10 DIAGNOSIS — E785 Hyperlipidemia, unspecified: Secondary | ICD-10-CM

## 2013-07-10 DIAGNOSIS — I350 Nonrheumatic aortic (valve) stenosis: Secondary | ICD-10-CM

## 2013-07-10 DIAGNOSIS — I10 Essential (primary) hypertension: Secondary | ICD-10-CM

## 2013-07-10 NOTE — Patient Instructions (Signed)
Your physician has requested that you have an echocardiogram in 6 MONTHS. Echocardiography is a painless test that uses sound waves to create images of your heart. It provides your doctor with information about the size and shape of your heart and how well your heart's chambers and valves are working. This procedure takes approximately one hour. There are no restrictions for this procedure.  Your physician has requested that you have an exercise tolerance test in 6 MONTHS with Dr Excell Seltzer. For further information please visit [email or web address] Please also follow instruction sheet, as given.  Your physician recommends that you continue on your current medications as directed. Please refer to the Current Medication list given to you today.

## 2013-07-10 NOTE — Progress Notes (Signed)
HPI:   72 year old gentleman presenting for followup of aortic stenosis, CAD with history of MI and remote CABG, and paroxysmal atrial fibrillation. He has developed progressive aortic stenosis her last several years. He underwent right and left heart catheterization last year for further evaluation. This demonstrated patency of his bypass grafts, moderately reduced LV function, and severe aortic stenosis. Because of minimal symptoms ongoing medical therapy was recommended. He just had labs checked July 30 showed a cholesterol of 134, triglycerides 144, HDL 45, and LDL 60. Kidney and liver function was normal. Thyroid studies were normal. Hemoglobin was mildly decreased at 12.5 and platelets were mildly decreased at 128,000. All of his blood work was stable from past studies.  From a clinical perspective he continues to do quite well. He remains active with yard work and daily exercise. He goes to the gym and walks on the treadmill without symptoms. He uses a weed eater and mows his lawn without problems. He specifically denies chest pain, chest pressure, shortness of breath, or lightheadedness/syncope. His medications are unchanged.   Outpatient Encounter Prescriptions as of 07/10/2013  Medication Sig Dispense Refill  . amiodarone (PACERONE) 200 MG tablet Take 0.5 tablets (100 mg total) by mouth daily.  30 tablet  6  . amLODipine (NORVASC) 5 MG tablet Take 1 tablet (5 mg total) by mouth daily.  30 tablet  6  . Ascorbic Acid (VITAMIN C) 1000 MG tablet Take 1,000 mg by mouth daily.        [name] aspirin 81 MG tablet Take 81 mg by mouth daily.        [name] atorvastatin (LIPITOR) 80 MG tablet Take 0.5 tablets (40 mg total) by mouth daily.  45 tablet  3  . loratadine (CLARITIN) 10 MG tablet Take 10 mg by mouth daily as needed.        . Multiple Vitamins-Minerals (MULTIVITAMIN,TX-MINERALS) tablet Take 1 tablet by mouth daily.        [name] olmesartan (BENICAR) 40 MG tablet Take 1 tablet (40 mg total) by mouth daily.   28 tablet  0  . pantoprazole (PROTONIX) 40 MG tablet Take 1 tablet (40 mg total) by mouth daily.  30 tablet  3  . PROAIR HFA 108 (90 BASE) MCG/ACT inhaler Inhale 2 puffs into the lungs every 4 (four) hours as needed for wheezing or shortness of breath.       . warfarin (COUMADIN) 5 MG tablet Take 1 tablet (5 mg total) by mouth as directed. Take as directed by coumadin clinic.  30 tablet  3  . [DISCONTINUED] benzonatate (TESSALON) 100 MG capsule Take 1 capsule (100 mg total) by mouth 3 (three) times daily as needed for cough.  30 capsule  2  . [DISCONTINUED] cefUROXime (CEFTIN) 500 MG tablet Take 500 mg by mouth 2 (two) times daily.       . [DISCONTINUED] HYDROcodone-homatropine (HYCODAN) 5-1.5 MG/5ML syrup Take 5 mLs by mouth 4 (four) times daily as needed for cough.       No facility-administered encounter medications on file as of 07/10/2013.    Allergies  Allergen Reactions  . Codeine Other (See Comments)    Pt does not remember  . Iodine Swelling    Past Medical History  Diagnosis Date  . Esophageal reflux   . Atrial fibrillation     holding sinus rhythm on Amiodarone  . Aortic stenosis     mild with a mean aortic valve gradient of 12 mmHg  . CAD (coronary artery disease)  1997    CABG  . Unspecified essential hypertension   . History of transesophageal echocardiography (TEE) for monitoring   . Cardiomyopathy   . Other and unspecified hyperlipidemia   . Skin lesions, generalized     facial which may represent actinic keratoses and possible photosensitivity from Amiodarone  . GERD (gastroesophageal reflux disease)   . History of colonoscopy   . 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 Acute myocardial infarction of inferior wall 1980    ROS: Negative except as per HPI  BP 120/80  Pulse 58  Ht 5\' 3"  (1.6 m)  Wt 62.596 kg (138 lb)  BMI 24.45 kg/m2  PHYSICAL  EXAM: Pt is alert and oriented, NAD HEENT: normal Neck: JVP - normal, carotids the leg without bruits Lungs: CTA bilaterally CV: RRR with grade 4/6 harsh crescendo decrescendo murmur at the right upper sternal border with absent aortic closure sounds Abd: soft, NT, Positive BS, no hepatomegaly Ext: no C/C/E, distal pulses intact and equal Skin: warm/dry no rash  EKG:  Sinus rhythm 57 beats per minute, left ventricular hypertrophy with repolarization abnormality, inferolateral infarct age undetermined.  2-D echocardiogram 01/07/2013: Left ventricle: The cavity size was normal. Wall thickness was increased in a pattern of mild LVH. Systolic function was mildly reduced. The estimated ejection fraction was in the range of 45% to 50%. Regional wall motion abnormalities: Hypokinesis of the entire myocardium. Akinesis of the basal-midinferior myocardium. Doppler parameters are consistent with abnormal left ventricular relaxation (grade 1 diastolic dysfunction).  ------------------------------------------------------------ Aortic valve: Trileaflet. Doppler: There was severe stenosis. No regurgitation. VTI ratio of LVOT to aortic valve: 0.14. Indexed valve area: 0.37cm^2/m^2 (VTI). Peak velocity ratio of LVOT to aortic valve: 0.18. Indexed valve area: 0.47cm^2/m^2 (Vmax). Mean gradient: 39mm Hg (S). Peak gradient: 67mm Hg (S).  ------------------------------------------------------------ Aorta: The aorta was normal, not dilated, and non-diseased. Aortic root: The aortic root was normal in size. Ascending aorta: The ascending aorta was normal in size.  ------------------------------------------------------------ Mitral valve: Structurally normal valve. Leaflet separation was normal. Doppler: Transvalvular velocity was within the normal range. There was no evidence for stenosis. Trivial regurgitation. Peak gradient: 2mm Hg  (D).  ------------------------------------------------------------ Left atrium: The atrium was mildly to moderately dilated.  ------------------------------------------------------------ Atrial septum: No defect or patent foramen ovale was identified.  ------------------------------------------------------------ Right ventricle: The cavity size was normal. Wall thickness was normal. Systolic function was mildly reduced.  ------------------------------------------------------------ Pulmonic valve: Structurally normal valve. Cusp separation was normal. Doppler: Transvalvular velocity was within the normal range. No significant regurgitation.  ------------------------------------------------------------ Tricuspid valve: Structurally normal valve. Leaflet separation was normal. Doppler: Transvalvular velocity was within the normal range. Trivial regurgitation.  ------------------------------------------------------------ Pulmonary artery: The main pulmonary artery was normal-sized.  ------------------------------------------------------------ Right atrium: The atrium was normal in size.  ------------------------------------------------------------ Pericardium: The pericardium was normal in appearance.  ASSESSMENT AND PLAN: 1. Severe aortic stenosis. Clinical exam is consistent with very severe aortic stenosis as he has delayed carotid upstrokes, a late peaking murmur, and absent aortic closure sound. He is remarkably asymptomatic at a good workload. He's had previous cardiac surgery. We will continue to follow him clinically and an absence of symptoms. I am going to see him back in 6 months with an echocardiogram and an exercise treadmill study. He understands to call immediately if he develops symptoms of shortness of breath, exertional chest discomfort, or lightheadedness.  2. Coronary artery disease, native vessel. Bypass grafts were patent at heart catheterization last year. He  has no anginal symptoms.  3. Paroxysmal atrial fibrillation. He remains on low-dose amiodarone. Recent thyroid and liver studies were within normal limits. He's anticoagulated with warfarin.  4. Hyperlipidemia. Lipids are at goal on atorvastatin 40 mg daily.  For followup I will see him back in 6 months with a treadmill study and an echocardiogram.  [name] 07/10/2013 2:38 PM

## 2013-08-06 ENCOUNTER — Ambulatory Visit (INDEPENDENT_AMBULATORY_CARE_PROVIDER_SITE_OTHER): Payer: Medicare Other | Admitting: Internal Medicine

## 2013-08-06 ENCOUNTER — Encounter: Payer: Self-pay | Admitting: Internal Medicine

## 2013-08-06 DIAGNOSIS — T6391XA Toxic effect of contact with unspecified venomous animal, accidental (unintentional), initial encounter: Secondary | ICD-10-CM

## 2013-08-06 DIAGNOSIS — T63461A Toxic effect of venom of wasps, accidental (unintentional), initial encounter: Secondary | ICD-10-CM | POA: Insufficient documentation

## 2013-08-06 MED ORDER — DOXYCYCLINE HYCLATE 100 MG PO TABS: 100.0000 mg | ORAL_TABLET | Freq: Two times a day (BID) | ORAL | Status: DC

## 2013-08-06 MED ORDER — TRIAMCINOLONE ACETONIDE 0.5 % EX CREA: TOPICAL_CREAM | Freq: Three times a day (TID) | CUTANEOUS | Status: DC

## 2013-08-06 NOTE — Progress Notes (Signed)
  Subjective:    Patient ID: Jesus [name], male    DOB: 29-Nov-1941, 72 y.o.   MRN: [id number]  HPI  C/o a yellow jacket sting in the L index on Monday - c/o pain and swelling. A little better today...   Review of Systems  Constitutional: Negative for fever and fatigue.  HENT: Negative for congestion, sore throat and neck pain.   Respiratory: Negative for cough and wheezing.   Gastrointestinal: Negative for nausea.  Musculoskeletal: Negative for arthralgias.       Objective:   Physical Exam  Skin: No rash noted. There is erythema.  L dorsal hand is with erythema and swelling          Assessment & Plan:

## 2013-08-06 NOTE — Assessment & Plan Note (Signed)
9/14 L hand Zyrtec 1 a day Triamc cream Abx if worse

## 2013-08-06 NOTE — Patient Instructions (Signed)
Take Doxycycline (antibiotic) if worse Take Zyrtec 1 a day until much better

## 2013-08-09 ENCOUNTER — Other Ambulatory Visit: Payer: Self-pay

## 2013-08-09 MED ORDER — AMLODIPINE BESYLATE 5 MG PO TABS: 5.0000 mg | ORAL_TABLET | Freq: Every day | ORAL | Status: DC

## 2013-08-19 ENCOUNTER — Other Ambulatory Visit: Payer: Self-pay

## 2013-08-19 MED ORDER — WARFARIN SODIUM 5 MG PO TABS: 5.0000 mg | ORAL_TABLET | ORAL | Status: DC

## 2013-08-21 ENCOUNTER — Ambulatory Visit (INDEPENDENT_AMBULATORY_CARE_PROVIDER_SITE_OTHER): Payer: Medicare Other | Admitting: General Practice

## 2013-08-21 DIAGNOSIS — I4891 Unspecified atrial fibrillation: Secondary | ICD-10-CM

## 2013-08-21 LAB — POCT INR: INR: 2.5

## 2013-09-04 ENCOUNTER — Telehealth: Payer: Self-pay | Admitting: *Deleted

## 2013-09-04 NOTE — Telephone Encounter (Signed)
A user error has taken place.

## 2013-09-19 ENCOUNTER — Other Ambulatory Visit: Payer: Self-pay

## 2013-09-19 MED ORDER — ATORVASTATIN CALCIUM 80 MG PO TABS: 40.0000 mg | ORAL_TABLET | Freq: Every day | ORAL | Status: DC

## 2013-10-08 ENCOUNTER — Ambulatory Visit (INDEPENDENT_AMBULATORY_CARE_PROVIDER_SITE_OTHER): Payer: Medicare Other | Admitting: General Practice

## 2013-10-08 DIAGNOSIS — I4891 Unspecified atrial fibrillation: Secondary | ICD-10-CM

## 2013-10-08 LAB — POCT INR: INR: 2.1

## 2013-10-08 NOTE — Progress Notes (Signed)
Pre-visit discussion using our clinic review tool. No additional management support is needed unless otherwise documented below in the visit note.  

## 2013-11-01 ENCOUNTER — Other Ambulatory Visit: Payer: Self-pay

## 2013-11-01 MED ORDER — PANTOPRAZOLE SODIUM 40 MG PO TBEC: 40.0000 mg | DELAYED_RELEASE_TABLET | Freq: Every day | ORAL | Status: DC

## 2013-11-19 ENCOUNTER — Ambulatory Visit (INDEPENDENT_AMBULATORY_CARE_PROVIDER_SITE_OTHER): Payer: Medicare Other | Admitting: General Practice

## 2013-11-19 DIAGNOSIS — I4891 Unspecified atrial fibrillation: Secondary | ICD-10-CM

## 2013-11-19 NOTE — Progress Notes (Signed)
Pre-visit discussion using our clinic review tool. No additional management support is needed unless otherwise documented below in the visit note.  

## 2013-12-06 NOTE — Telephone Encounter (Signed)
No other info °

## 2013-12-30 ENCOUNTER — Telehealth: Payer: Self-pay

## 2013-12-30 NOTE — Telephone Encounter (Signed)
Phone call to patient but spoke to his wife letting him know Benicar 40 mg samples can be picked up at the front desk. She states he will pick them up tomorrow.

## 2013-12-31 ENCOUNTER — Ambulatory Visit (INDEPENDENT_AMBULATORY_CARE_PROVIDER_SITE_OTHER): Payer: Medicare Other | Admitting: General Practice

## 2013-12-31 DIAGNOSIS — I4891 Unspecified atrial fibrillation: Secondary | ICD-10-CM

## 2013-12-31 DIAGNOSIS — Z5181 Encounter for therapeutic drug level monitoring: Secondary | ICD-10-CM | POA: Insufficient documentation

## 2013-12-31 LAB — POCT INR: INR: 2.1

## 2013-12-31 NOTE — Progress Notes (Signed)
Pre-visit discussion using our clinic review tool. No additional management support is needed unless otherwise documented below in the visit note.  

## 2014-01-03 ENCOUNTER — Telehealth: Payer: Self-pay | Admitting: Cardiovascular Disease

## 2014-01-03 DIAGNOSIS — E785 Hyperlipidemia, unspecified: Secondary | ICD-10-CM

## 2014-01-03 NOTE — Telephone Encounter (Signed)
New Problem:  Pt is asking if he can have blood work drawn at the [facility]. Pt does not have any orders. Pt is requesting a call back from the nurse.

## 2014-01-03 NOTE — Telephone Encounter (Signed)
Spoke with patient who would like to have fasting lab work done Monday in preparation for his 6 month follow-up.  Patient is scheduled for treadmill test with Dr. [name] next Friday and wants Dr. [name] to have lab results when he sees him that day.  Patient would like to get lab work done at [facility] in epic.

## 2014-01-06 ENCOUNTER — Other Ambulatory Visit (INDEPENDENT_AMBULATORY_CARE_PROVIDER_SITE_OTHER): Payer: Medicare Other

## 2014-01-06 ENCOUNTER — Other Ambulatory Visit (HOSPITAL_COMMUNITY): Payer: Medicare Other

## 2014-01-06 DIAGNOSIS — E785 Hyperlipidemia, unspecified: Secondary | ICD-10-CM

## 2014-01-06 LAB — BASIC METABOLIC PANEL
BUN: 16 mg/dL (ref 6–23)
CALCIUM: 8.7 mg/dL (ref 8.4–10.5)
CHLORIDE: 109 meq/L (ref 96–112)
CO2: 26 mEq/L (ref 19–32)
CREATININE: 0.8 mg/dL (ref 0.4–1.5)
GFR: 100.19 mL/min (ref >60.00)
Glucose, Bld: 94 mg/dL (ref 70–99)
Potassium: 4.6 mEq/L (ref 3.5–5.1)
SODIUM: 141 meq/L (ref 135–145)

## 2014-01-06 LAB — LIPID PANEL
CHOL/HDL RATIO: 3
Cholesterol: 135 mg/dL (ref 0–200)
HDL: 47.7 mg/dL (ref >39.00)
LDL CALC: 75 mg/dL (ref 0–99)
TRIGLYCERIDES: 62 mg/dL (ref 0.0–149.0)
VLDL: 12.4 mg/dL (ref 0.0–40.0)

## 2014-01-06 LAB — HEPATIC FUNCTION PANEL
ALK PHOS: 107 U/L (ref 39–117)
ALT: 24 U/L (ref 0–53)
AST: 24 U/L (ref 0–37)
Albumin: 3.8 g/dL (ref 3.5–5.2)
BILIRUBIN DIRECT: 0 mg/dL (ref 0.0–0.3)
BILIRUBIN TOTAL: 0.8 mg/dL (ref 0.3–1.2)
Total Protein: 6.6 g/dL (ref 6.0–8.3)

## 2014-01-10 ENCOUNTER — Ambulatory Visit (INDEPENDENT_AMBULATORY_CARE_PROVIDER_SITE_OTHER): Payer: Medicare Other | Admitting: Cardiovascular Disease

## 2014-01-10 DIAGNOSIS — I2510 Atherosclerotic heart disease of native coronary artery without angina pectoris: Secondary | ICD-10-CM

## 2014-01-10 NOTE — Progress Notes (Signed)
Exercise Treadmill Test  Pre-Exercise Testing Evaluation Rhythm: sinus bradycardia  Rate: 56 bpm     Test  Exercise Tolerance Test Ordering MD: [name], MD  Interpreting MD: [name], MD  Unique Test No: 1  Treadmill:  1  Indication for ETT: known ASHD  Contraindication to ETT: No   Stress Modality: exercise - treadmill  Cardiac Imaging Performed: non   Protocol: standard Bruce - maximal  Max BP:  150/73  Max MPHR (bpm):  146 85% MPR (bpm):  124  MPHR obtained (bpm):  133 % MPHR obtained:  91  Reached 85% MPHR (min:sec):  7:20 Total Exercise Time (min-sec):  9:00  Workload in METS:  10.1 Borg Scale: 17  Reason ETT Terminated:  fatigue    ST Segment Analysis At Rest: normal ST segments - no evidence of significant ST depression With Exercise: non-specific ST changes  Other Information Arrhythmia:  No Angina during ETT:  absent (0) Quality of ETT:  diagnostic  ETT Interpretation:  normal - no evidence of ischemia by ST analysis  Comments: 1. Good exercise tolerance 2. No angina, dyspnea, or lightheadedness at this level of exertion 3. BP drop at immediate exercise termination, rebounded within 2 minutes recovery  Recommendations: Treadmill done for surveillance of severe aortic stenosis (asymptomatic). Overall low-risk study, but note BP drop without symptoms at exercise completion with rapid BP recovery. Echo to follow next week.

## 2014-01-16 ENCOUNTER — Encounter: Payer: Self-pay | Admitting: Cardiovascular Disease

## 2014-01-22 ENCOUNTER — Other Ambulatory Visit (HOSPITAL_COMMUNITY): Payer: Medicare Other

## 2014-01-27 ENCOUNTER — Telehealth: Payer: Self-pay | Admitting: *Deleted

## 2014-01-27 NOTE — Telephone Encounter (Signed)
Patient phoned requesting samples for benicar samples. Phoned and let spouse know that samples for 2 months worth in bin awaiting p/u.

## 2014-01-29 ENCOUNTER — Other Ambulatory Visit: Payer: Self-pay | Admitting: General Practice

## 2014-01-29 MED ORDER — WARFARIN SODIUM 5 MG PO TABS: ORAL_TABLET | ORAL | Status: DC

## 2014-02-06 ENCOUNTER — Encounter: Payer: Self-pay | Admitting: Cardiovascular Disease

## 2014-02-06 ENCOUNTER — Other Ambulatory Visit (HOSPITAL_COMMUNITY): Payer: Self-pay | Admitting: Radiology

## 2014-02-06 ENCOUNTER — Ambulatory Visit (HOSPITAL_COMMUNITY): Payer: Medicare Other | Attending: Cardiovascular Disease | Admitting: Radiology

## 2014-02-06 DIAGNOSIS — I2510 Atherosclerotic heart disease of native coronary artery without angina pectoris: Secondary | ICD-10-CM | POA: Insufficient documentation

## 2014-02-06 DIAGNOSIS — I359 Nonrheumatic aortic valve disorder, unspecified: Secondary | ICD-10-CM

## 2014-02-06 NOTE — Progress Notes (Signed)
Echocardiogram Performed. 

## 2014-02-10 ENCOUNTER — Ambulatory Visit (INDEPENDENT_AMBULATORY_CARE_PROVIDER_SITE_OTHER): Payer: Medicare Other | Admitting: Thoracic Surgery (Cardiothoracic Vascular Surgery)

## 2014-02-10 ENCOUNTER — Encounter: Payer: Self-pay | Admitting: Thoracic Surgery (Cardiothoracic Vascular Surgery)

## 2014-02-10 ENCOUNTER — Telehealth: Payer: Self-pay | Admitting: Cardiovascular Disease

## 2014-02-10 VITALS — BP 123/66 | HR 55 | Resp 16 | Ht 62.0 in | Wt 132.0 lb

## 2014-02-10 DIAGNOSIS — I2510 Atherosclerotic heart disease of native coronary artery without angina pectoris: Secondary | ICD-10-CM

## 2014-02-10 DIAGNOSIS — I359 Nonrheumatic aortic valve disorder, unspecified: Secondary | ICD-10-CM

## 2014-02-10 DIAGNOSIS — Z951 Presence of aortocoronary bypass graft: Secondary | ICD-10-CM

## 2014-02-10 DIAGNOSIS — I350 Nonrheumatic aortic (valve) stenosis: Secondary | ICD-10-CM

## 2014-02-10 NOTE — Progress Notes (Signed)
[address]       [place] [address]             [phone]     CARDIOTHORACIC SURGERY OFFICE NOTE  Referring Provider is [name], MD PCP is Adella Hare, MD   HPI:  Patient returns for followup of severe aortic stenosis. He has long-standing history of coronary artery disease with ischemic cardiomyopathy, and he underwent coronary artery bypass grafting x8 by Dr. [name] in 1997. He has developed aortic stenosis that has progressed on followup echocardiograms. Cardiac catheterization performed 08/02/2012 confirmed the presence of severe aortic stenosis with mean gradient measured 44 mm mercury and aortic valve area estimated 0.9 cm. All the patient's previous bypass graft were patent. He was originally seen in consultation 08/10/2012.  At that time the patient remained remarkably asymptomatic, and because of this we decided to follow him closely.  He was seen most recently in our office on 02/11/2013 and since then he has been seen by Dr. [name].  This past month he underwent exercise treadmill testing and followup echocardiogram.  He did remarkably well on an excise treadmill test with good exercise tolerance and no symptoms at peak stress.  His blood pressure did drop slightly at the time of termination of exercise, but rebounded quickly within 2 minutes. Followup echocardiogram demonstrates severe aortic stenosis with peak velocity across the valve measured 4.5 m/s corresponding to peak and mean transvalvular gradients estimated 83 and 47 mm mercury, respectively. This has increased somewhat from peak velocity measured 4.1 m/s in February 2014.  Left ventricular ejection fraction remains stable at 40-45%.  Patient returns to the office today for followup. He reports no new problems or complaints. He remains active physically and he specifically denies any symptoms of exertional shortness of breath or chest discomfort. He states that he has not appreciated that  he tires more easily than he used to, and his wife remarks that not even a cold weather we have experience recently has bothered him. He has never had any dizzy spells, nor syncope.  He has done well otherwise and reports no significant problems or complaints.    Current Outpatient Prescriptions  Medication Sig Dispense Refill  . amiodarone (PACERONE) 200 MG tablet Take 0.5 tablets (100 mg total) by mouth daily.  30 tablet  6  . amLODipine (NORVASC) 5 MG tablet Take 1 tablet (5 mg total) by mouth daily.  30 tablet  6  . Ascorbic Acid (VITAMIN C) 1000 MG tablet Take 1,000 mg by mouth daily.        [name] aspirin 81 MG tablet Take 81 mg by mouth daily.        [name] atorvastatin (LIPITOR) 80 MG tablet Take 0.5 tablets (40 mg total) by mouth daily.  45 tablet  3  . loratadine (CLARITIN) 10 MG tablet Take 10 mg by mouth daily as needed.        . Multiple Vitamins-Minerals (MULTIVITAMIN,TX-MINERALS) tablet Take 1 tablet by mouth daily.        [name] olmesartan (BENICAR) 40 MG tablet Take 1 tablet (40 mg total) by mouth daily.  28 tablet  0  . pantoprazole (PROTONIX) 40 MG tablet Take 1 tablet (40 mg total) by mouth daily.  90 tablet  3  . triamcinolone cream (KENALOG) 0.5 % Apply topically 3 (three) times daily.  60 g  1  . warfarin (COUMADIN) 5 MG tablet Take as directed by coumadin clinic.  30 tablet  3  No current facility-administered medications for this visit.      Physical Exam:   BP 123/66  Pulse 55  Resp 16  Ht 5\' 2"  (1.575 m)  Wt 132 lb (59.875 kg)  BMI 24.14 kg/m2  SpO2 98%  General:  Well-appearing  Chest:   Clear to auscultation  CV:   Regular rate and rhythm with prominent systolic murmur heard along the sternal border  Incisions:  n/a  Abdomen:  Soft and nontender  Extremities:  Warm and well-perfused  Diagnostic Tests:  Exercise Treadmill Test  Pre-Exercise Testing Evaluation Rhythm: sinus bradycardia   Rate: 56 bpm      Test  Exercise Tolerance Test Ordering MD: [name], MD   Interpreting MD: [name], MD   Unique Test No: 1   Treadmill:  1   Indication for ETT: known ASHD   Contraindication to ETT: No    Stress Modality: exercise - treadmill   Cardiac Imaging Performed: non    Protocol: standard Bruce - maximal   Max BP:  150/73   Max MPHR (bpm):  146  85% MPR (bpm):  124   MPHR obtained (bpm):  133  % MPHR obtained:  91   Reached 85% MPHR (min:sec):  7:20  Total Exercise Time (min-sec):  9:00   Workload in METS:  10.1  Borg Scale: 17   Reason ETT Terminated:  fatigue      ST Segment Analysis At Rest:           normal ST segments - no evidence of significant ST depression With Exercise:            non-specific ST changes  Other Information Arrhythmia:  No           Angina during ETT:  absent (0) Quality of ETT:  diagnostic  ETT Interpretation:  normal - no evidence of ischemia by ST analysis  Comments: 1. Good exercise tolerance 2. No angina, dyspnea, or lightheadedness at this level of exertion 3. BP drop at immediate exercise termination, rebounded within 2 minutes recovery  Recommendations: Treadmill done for surveillance of severe aortic stenosis (asymptomatic). Overall low-risk study, but note BP drop without symptoms at exercise completion with rapid BP recovery. Echo to follow next week.     Transthoracic Echocardiography  Patient:    Avraham, Stadtler MR #:       [id number] Study Date: 02/06/2014 Gender:     M Age:        73 Height:     157.5cm Weight:     59.9kg BSA:        1.5m^2 Pt. Status: Room:    ATTENDING    Nahser, Rulon Eisenmenger  SONOGRAPHER  McFatter, [facility]  PERFORMING   Chmg, Outpatient cc:  ------------------------------------------------------------ LV EF: 40% -   45%  ------------------------------------------------------------ Indications:      Aortic stenosis 424.1.  CAD of native vessels  414.01.  ------------------------------------------------------------ History:   PMH:  Acquired from the patient and from the patient's chart.  Atrial fibrillation.  Coronary artery disease.  PMH:   Myocardial infarction.  Risk factors: Dyslipidemia.  ------------------------------------------------------------ Study Conclusions  - Left ventricle: The cavity size was normal. Wall thickness   was normal. Systolic function was mildly to moderately   reduced. The estimated ejection fraction was in the range   of 40% to 45%. Wall motion was normal; there were no   regional  wall motion abnormalities. - Aortic valve: There was severe stenosis. Valve area:   0.64cm^2(VTI). Valve area: 0.68cm^2 (Vmax). - Left atrium: The atrium was moderately dilated. - Tricuspid valve: Mild-moderate regurgitation.  ------------------------------------------------------------ Labs, prior tests, procedures, and surgery: Echocardiography (January 07, 2013).     EF was 50%. Aortic valve: peak gradient of 23mm Hg.  Coronary artery bypass grafting (March 1997). Transthoracic echocardiography.  M-mode, complete 2D, spectral Doppler, and color Doppler.  Height:  Height: 157.5cm. Height: 62in.  Weight:  Weight: 59.9kg. Weight: 131.7lb.  Body mass index:  BMI: 24.1kg/m^2.  Body surface area:    BSA: 1.67m^2.  Blood pressure:     120/80.  Patient status:  Outpatient.  Location:  [facility]  ------------------------------------------------------------  ------------------------------------------------------------ Left ventricle:  The cavity size was normal. Wall thickness was normal. Systolic function was mildly to moderately reduced. The estimated ejection fraction was in the range of 40% to 45%. Wall motion was normal; there were no regional wall motion abnormalities. Early diastolic septal annular tissue Doppler velocities Ea were  abnormal.  ------------------------------------------------------------ Aortic valve:   Moderately thickened, moderately calcified leaflets.  Doppler:   There was severe stenosis.    No regurgitation.    VTI ratio of LVOT to aortic valve: 0.16. Valve area: 0.64cm^2(VTI). Indexed valve area: 0.4cm^2/m^2 (VTI). Peak velocity ratio of LVOT to aortic valve: 0.16. Valve area: 0.68cm^2 (Vmax). Indexed valve area: 0.43cm^2/m^2 (Vmax).    Mean gradient: 53mm Hg (S). Peak gradient: 46mm Hg (S).  ------------------------------------------------------------ Aorta:  Aortic root: The aortic root was normal in size. Ascending aorta: The ascending aorta was normal in size.  ------------------------------------------------------------ Mitral valve:   Mildly thickened leaflets .  Doppler: Trivial regurgitation.    Peak gradient: 63mm Hg (D).  ------------------------------------------------------------ Left atrium:  The atrium was moderately dilated.  ------------------------------------------------------------ Right ventricle:  The cavity size was normal. Systolic function was normal.  ------------------------------------------------------------ Pulmonic valve:    The valve appears to be grossly normal.  Doppler:   No significant regurgitation.  ------------------------------------------------------------ Tricuspid valve:   Structurally normal valve.    Doppler: Mild-moderate regurgitation.  ------------------------------------------------------------ Pulmonary artery:   Systolic pressure was within the normal range.  ------------------------------------------------------------ Right atrium:  The atrium was normal in size.  ------------------------------------------------------------ Pericardium:  There was no pericardial effusion.  ------------------------------------------------------------ Systemic veins: Inferior vena cava: The vessel was normal in size; the respirophasic diameter  changes were in the normal range (= 50%); findings are consistent with normal central venous pressure.  ------------------------------------------------------------  2D measurements        Normal  Doppler measurements   Normal Left ventricle                 Main pulmonary LVID ED,   49.7 mm     43-52   artery chord,                         Pressure,    28 mm Hg  =30 PLAX                           S LVID ES,   37.8 mm     23-38   Left ventricle chord,                         Ea, lat    8.87 cm/s   ------ PLAX  ann, tiss FS, chord,   24 %      >29     DP PLAX                           E/Ea, lat  11.5        ------ LVPW, ED     11 mm     ------  ann, tiss IVS/LVPW   0.93        <1.3    DP ratio, ED                      Ea, med    5.75 cm/s   ------ Ventricular septum             ann, tiss IVS, ED    10.2 mm     ------  DP LVOT                           E/Ea, med  17.7        ------ Diam, S      23 mm     ------  ann, tiss     4 Area       4.15 cm^2   ------  DP Diam         23 mm     ------  LVOT Aorta                          Peak vel,  74.3 cm/s   ------ Root diam,   34 mm     ------  S ED                             VTI, S     17.7 cm     ------ Left atrium                    Stroke vol 73.5 ml     ------ AP dim       50 mm     ------  Stroke       46 ml/m^2 ------ AP dim     3.13 cm/m^2 <2.2    index index                          Aortic valve                                Peak vel,   455 cm/s   ------                                S                                Mean vel,   320 cm/s   ------                                S  VTI, S      114 cm     ------                                Mean         47 mm Hg  ------                                gradient,                                S                                Peak         83 mm Hg  ------                                gradient,                                 S                                VTI ratio  0.16        ------                                LVOT/AV                                Area, VTI  0.64 cm^2   ------                                Area index  0.4 cm^2/m ------                                (VTI)           ^2                                Peak vel   0.16        ------                                ratio,                                LVOT/AV                                Area, Vmax 0.68 cm^2   ------  Area index 0.43 cm^2/m ------                                (Vmax)          ^2                                Mitral valve                                Peak E vel  102 cm/s   ------                                Peak A vel  117 cm/s   ------                                Decelerati  201 ms     150-23                                on time                0                                Peak          4 mm Hg  ------                                gradient,                                D                                Peak E/A    0.9        ------                                ratio                                Max regurg  385 cm/s   ------                                vel                                Tricuspid valve                                Regurg      242 cm/s   ------  peak vel                                Peak RV-RA   23 mm Hg  ------                                gradient,                                S                                Max regurg  242 cm/s   ------                                vel                                Systemic veins                                Estimated     5 mm Hg  ------                                CVP                                Right ventricle                                Pressure,    28 mm Hg  <30                                S                                Sa vel,    10.9 cm/s    ------                                lat ann,                                tiss DP   ------------------------------------------------------------ Prepared and Electronically Authenticated by  Mertie Moores 2015-03-05T09:40:32.120        Impression:  Patient has severe aortic stenosis with moderate left ventricular systolic function related to long-standing history of ischemic cardiomyopathy, status post coronary artery bypass grafting in 1998.  Despite the fact that the severity of aortic stenosis has continued to gradually get worse, the patient remains remarkably asymptomatic. He was able to demonstrate good exercise tolerance on recent treadmill test.   Plan:  We discussed options in the office today. The patient prefers to continue close followup with serial echocardiograms. I've reminded the  patient and his wife to be careful to look for any signs of worsening fatigue, exertional shortness of breath, chest discomfort, or dizzy spells. He plans to followup with Dr. [name] in 6 months with repeat echocardiogram. We will plan to see him back in the multidisciplinary heart valve clinic in one year.   Valentina Gu. Roxy Manns, MD 02/10/2014 1:26 PM

## 2014-02-10 NOTE — Telephone Encounter (Signed)
Reviewed echo results with patient who verbalized understanding and is aware of scheduled f/u.

## 2014-02-10 NOTE — Telephone Encounter (Signed)
New message  ° ° °Returning call back to nurse.  °

## 2014-02-10 NOTE — Patient Instructions (Signed)
Call if he notices any tendency to get tired or short breath with physical exertion.

## 2014-02-11 ENCOUNTER — Ambulatory Visit (INDEPENDENT_AMBULATORY_CARE_PROVIDER_SITE_OTHER): Payer: Medicare Other | Admitting: General Practice

## 2014-02-11 DIAGNOSIS — Z5181 Encounter for therapeutic drug level monitoring: Secondary | ICD-10-CM

## 2014-02-11 DIAGNOSIS — I4891 Unspecified atrial fibrillation: Secondary | ICD-10-CM

## 2014-02-11 LAB — POCT INR: INR: 1.9

## 2014-02-11 NOTE — Progress Notes (Signed)
Pre visit review using our clinic review tool, if applicable. No additional management support is needed unless otherwise documented below in the visit note. 

## 2014-02-19 ENCOUNTER — Ambulatory Visit (INDEPENDENT_AMBULATORY_CARE_PROVIDER_SITE_OTHER): Payer: Medicare Other | Admitting: Internal Medicine

## 2014-02-19 ENCOUNTER — Encounter: Payer: Self-pay | Admitting: Internal Medicine

## 2014-02-19 VITALS — BP 108/70 | HR 61 | Temp 97.2°F | Wt 135.8 lb

## 2014-02-19 DIAGNOSIS — L84 Corns and callosities: Secondary | ICD-10-CM

## 2014-02-19 NOTE — Progress Notes (Signed)
Pre visit review using our clinic review tool, if applicable. No additional management support is needed unless otherwise documented below in the visit note. 

## 2014-02-22 NOTE — Progress Notes (Signed)
   Subjective:    Patient ID: Jesus [name], male    DOB: 30-Mar-1941, 73 y.o.   MRN: [id number]  HPI [name] presents for evaluation of a lesion on the base of his foot. It is minimally uncomfortable. There is no raised lesion. There has been no injury, no drainage.  PMH, FamHx and SocHx reviewed for any changes and relevance.  Current Outpatient Prescriptions on File Prior to Visit  Medication Sig Dispense Refill  . amiodarone (PACERONE) 200 MG tablet Take 0.5 tablets (100 mg total) by mouth daily.  30 tablet  6  . amLODipine (NORVASC) 5 MG tablet Take 1 tablet (5 mg total) by mouth daily.  30 tablet  6  . Ascorbic Acid (VITAMIN C) 1000 MG tablet Take 1,000 mg by mouth daily.        [name] aspirin 81 MG tablet Take 81 mg by mouth daily.        [name] atorvastatin (LIPITOR) 80 MG tablet Take 0.5 tablets (40 mg total) by mouth daily.  45 tablet  3  . loratadine (CLARITIN) 10 MG tablet Take 10 mg by mouth daily as needed.        . Multiple Vitamins-Minerals (MULTIVITAMIN,TX-MINERALS) tablet Take 1 tablet by mouth daily.        [name] olmesartan (BENICAR) 40 MG tablet Take 1 tablet (40 mg total) by mouth daily.  28 tablet  0  . pantoprazole (PROTONIX) 40 MG tablet Take 1 tablet (40 mg total) by mouth daily.  90 tablet  3  . triamcinolone cream (KENALOG) 0.5 % Apply topically 3 (three) times daily.  60 g  1  . warfarin (COUMADIN) 5 MG tablet Take as directed by coumadin clinic.  30 tablet  3   No current facility-administered medications on file prior to visit.      Review of Systems System review is negative for any constitutional, cardiac, pulmonary, GI or neuro symptoms or complaints other than as described in the HPI.     Objective:   Physical Exam Filed Vitals:   02/19/14 1431  BP: 108/70  Pulse: 61  Temp: 97.2 F (36.2 C)   Gen'l WNWD man in no distress Cor 2+ radial pulse Pul Normal respirations Derm - callus at the plantar aspect foot at the MTP 3rd digit.      Assessment &  Plan:  Callus - plantar callus w/o signs of infection or plantar wart.  Plan Continue to use pumice stone to keep callus build up from causing pain. No need for excision

## 2014-03-03 ENCOUNTER — Other Ambulatory Visit: Payer: Self-pay | Admitting: *Deleted

## 2014-03-03 MED ORDER — AMLODIPINE BESYLATE 5 MG PO TABS
5.0000 mg | ORAL_TABLET | Freq: Every day | ORAL | Status: DC
Start: 2014-03-03 — End: 2014-06-30

## 2014-03-10 ENCOUNTER — Telehealth: Payer: Self-pay | Admitting: *Deleted

## 2014-03-10 NOTE — Telephone Encounter (Signed)
Calling to see if [name] can refill medications until he has a PCP. Former was Dr Linda Hedges patient

## 2014-03-13 NOTE — Telephone Encounter (Signed)
I spoke with [name] and he is okay with helping fill the pt's medications while they are obtaining a new PCP. I spoke with the pt's wife and she said that Dr Daylene Posey office just called her and they are going to help with prescriptions. She will call back if she has any further problems.

## 2014-03-26 ENCOUNTER — Ambulatory Visit (INDEPENDENT_AMBULATORY_CARE_PROVIDER_SITE_OTHER): Payer: Medicare Other | Admitting: General Practice

## 2014-03-26 DIAGNOSIS — I4891 Unspecified atrial fibrillation: Secondary | ICD-10-CM

## 2014-03-26 LAB — POCT INR: INR: 3.2

## 2014-03-26 NOTE — Progress Notes (Signed)
Pre visit review using our clinic review tool, if applicable. No additional management support is needed unless otherwise documented below in the visit note. 

## 2014-04-03 ENCOUNTER — Ambulatory Visit: Payer: Medicare Other | Admitting: Family Medicine

## 2014-04-07 ENCOUNTER — Ambulatory Visit (INDEPENDENT_AMBULATORY_CARE_PROVIDER_SITE_OTHER): Payer: Medicare Other | Admitting: Family Medicine

## 2014-04-07 ENCOUNTER — Encounter: Payer: Self-pay | Admitting: Family Medicine

## 2014-04-07 ENCOUNTER — Telehealth: Payer: Self-pay | Admitting: Internal Medicine

## 2014-04-07 VITALS — BP 126/66 | HR 58 | Ht 62.0 in | Wt 138.0 lb

## 2014-04-07 DIAGNOSIS — H6120 Impacted cerumen, unspecified ear: Secondary | ICD-10-CM

## 2014-04-07 DIAGNOSIS — I2510 Atherosclerotic heart disease of native coronary artery without angina pectoris: Secondary | ICD-10-CM

## 2014-04-07 DIAGNOSIS — L84 Corns and callosities: Secondary | ICD-10-CM

## 2014-04-07 DIAGNOSIS — I359 Nonrheumatic aortic valve disorder, unspecified: Secondary | ICD-10-CM

## 2014-04-07 DIAGNOSIS — I4891 Unspecified atrial fibrillation: Secondary | ICD-10-CM

## 2014-04-07 DIAGNOSIS — H6122 Impacted cerumen, left ear: Secondary | ICD-10-CM

## 2014-04-07 DIAGNOSIS — E785 Hyperlipidemia, unspecified: Secondary | ICD-10-CM

## 2014-04-07 DIAGNOSIS — Z23 Encounter for immunization: Secondary | ICD-10-CM

## 2014-04-07 DIAGNOSIS — I10 Essential (primary) hypertension: Secondary | ICD-10-CM

## 2014-04-07 NOTE — Patient Instructions (Signed)

## 2014-04-07 NOTE — Progress Notes (Signed)
Subjective:    Patient ID: Jesus [name], male    DOB: 03-12-1941, 73 y.o.   MRN: [id number]  HPI Patient seen to establish care. He has history of hyperlipidemia, hypertension, CAD, severe aortic stenosis (though essentially asymptomatic at this time), ischemic cardiomyopathy, atrial fibrillation. He is maintained on amiodarone and Coumadin in addition to other medications which are reviewed. He is followed by Coumadin clinic regularly. He is followed by cardiology regularly. He has severe aortic stenosis but has not had any recent dyspnea or syncope or chest pains.  No recent bleeding complications. He has history of GERD which is controlled with pantoprazole. But pressures been stable. No orthostasis. He had bypass back in 1997. No history of prevnar 13.  He's had painful callus on the bottom of his right foot. He does occasional foot soaks with minimal relief.  Past Medical History  Diagnosis Date  . Esophageal reflux   . Atrial fibrillation     holding sinus rhythm on Amiodarone  . Aortic stenosis     mild with a mean aortic valve gradient of 12 mmHg  . CAD (coronary artery disease) 1997    CABG  . Unspecified essential hypertension   . History of transesophageal echocardiography (TEE) for monitoring   . Cardiomyopathy   . Other and unspecified hyperlipidemia   . Skin lesions, generalized     facial which may represent actinic keratoses and possible photosensitivity from Amiodarone  . GERD (gastroesophageal reflux disease)   . History of colonoscopy   . 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 Acute myocardial infarction of inferior wall 1980   Past Surgical History  Procedure Laterality Date  . Tonsillectomy    . Coronary artery bypass graft  02/09/1996    LIMA to diag-LAD, SVG to OM1-OM2-OM3, [facility] to [facility]  . Cholecystectomy    . Cardioversion   11/18/2006    Dr. [name]  . Cataract extraction w/ intraocular lens implant  April '13  (Dr. [name])    left eye only    reports that he has never smoked. He has never used smokeless tobacco. He reports that he does not drink alcohol or use illicit drugs. family history includes Atrial fibrillation in his brother; Crohn's disease in his mother; Heart attack (age of onset: 93) in his father; Heart disease in his father and paternal uncle; Heart failure in his father; Prostate cancer in his paternal uncle; Stroke (age of onset: 22) in his mother. Allergies  Allergen Reactions  . Codeine Other (See Comments)    Pt does not remember  . Iodine Swelling      Review of Systems  Constitutional: Negative for fatigue.  HENT: Negative for ear discharge and ear pain.   Eyes: Negative for visual disturbance.  Respiratory: Negative for cough, chest tightness and shortness of breath.   Cardiovascular: Negative for chest pain, palpitations and leg swelling.  Gastrointestinal: Negative for abdominal pain.  Endocrine: Negative for polydipsia and polyuria.  Genitourinary: Negative for dysuria.  Neurological: Negative for dizziness, syncope, weakness, light-headedness and headaches.  Psychiatric/Behavioral: Negative for confusion.       Objective:   Physical Exam  Constitutional: He is oriented to person, place, and time. He appears well-developed and well-nourished.  HENT:  Right Ear: External ear normal.  Cerumen left canal. Removed with curette without difficulty. Eardrums appear normal  Neck: Neck supple.  No thyromegaly present.  Cardiovascular: Normal rate and regular rhythm.   Murmur heard. Patient's prominent heart murmur especially aortic area four over 6 systolic murmur. No gallop  Pulmonary/Chest: Effort normal and breath sounds normal. No respiratory distress. He has no wheezes. He has no rales.  Musculoskeletal: He exhibits no edema.  Neurological: He is alert and oriented to  person, place, and time.  Skin:  Patient is a couple of calluses on the ball of the right foot. These are nontender to palpation  Psychiatric: He has a normal mood and affect. His behavior is normal.          Assessment & Plan:  #1 history of aortic stenosis. Patient is asymptomatic and followed regularly by cardiology #2 history of atrial fibrillation. Currently appears to be sinus rhythm. He is maintained on amiodarone and Coumadin #3 hypertension which is well-controlled. Continue current medications. Samples of Benicar 40 mg daily given #4 hyperlipidemia. Continue atorvastatin #5 small calluses ball right foot. He'll try warm foot soaks along with pumice stone. Followup here if this is not helping #6 health maintenance. Prevnar 13 immunization given. Long discussion regarding pros and cons of PSA and given his age and comorbidities we have not recommended further PSA testing at this time #7 cerumen left ear removed with currete.

## 2014-04-07 NOTE — Telephone Encounter (Signed)
Relevant patient education mailed to patient.  

## 2014-04-07 NOTE — Progress Notes (Signed)
Pre visit review using our clinic review tool, if applicable. No additional management support is needed unless otherwise documented below in the visit note. 

## 2014-05-06 ENCOUNTER — Telehealth: Payer: Self-pay | Admitting: Family Medicine

## 2014-05-06 NOTE — Telephone Encounter (Signed)
No samples. Called pt and no VM set up

## 2014-05-06 NOTE — Telephone Encounter (Signed)
Pt called to request samples of Benicar 40 mg.  He said Dr. Elease Hashimoto told him he could call.

## 2014-05-07 ENCOUNTER — Ambulatory Visit (INDEPENDENT_AMBULATORY_CARE_PROVIDER_SITE_OTHER): Payer: Medicare Other | Admitting: General Practice

## 2014-05-07 DIAGNOSIS — I4891 Unspecified atrial fibrillation: Secondary | ICD-10-CM

## 2014-05-07 DIAGNOSIS — Z5181 Encounter for therapeutic drug level monitoring: Secondary | ICD-10-CM

## 2014-05-07 LAB — POCT INR: INR: 1.7

## 2014-05-07 NOTE — Progress Notes (Signed)
Pre visit review using our clinic review tool, if applicable. No additional management support is needed unless otherwise documented below in the visit note. 

## 2014-05-12 MED ORDER — LOSARTAN POTASSIUM 100 MG PO TABS: 100.0000 mg | ORAL_TABLET | Freq: Every day | ORAL | Status: DC

## 2014-05-12 NOTE — Telephone Encounter (Signed)
Rx sent to pharmacy pt is aware  

## 2014-05-12 NOTE — Telephone Encounter (Signed)
Pt states he cannot afford this rx anymore. Pt will need an alternate drug. pls advise. Pt states dr Elease Hashimoto will change med when no samples were available. [facility] [place]

## 2014-05-12 NOTE — Telephone Encounter (Signed)
Change Benicar to Losartan 100 mg once daily.

## 2014-06-03 ENCOUNTER — Other Ambulatory Visit: Payer: Self-pay | Admitting: *Deleted

## 2014-06-03 MED ORDER — AMIODARONE HCL 200 MG PO TABS
100.0000 mg | ORAL_TABLET | Freq: Every day | ORAL | Status: DC
Start: 2014-06-03 — End: 2014-11-25

## 2014-06-04 ENCOUNTER — Ambulatory Visit (INDEPENDENT_AMBULATORY_CARE_PROVIDER_SITE_OTHER): Payer: Medicare Other | Admitting: General Practice

## 2014-06-04 DIAGNOSIS — Z5181 Encounter for therapeutic drug level monitoring: Secondary | ICD-10-CM

## 2014-06-04 DIAGNOSIS — I4891 Unspecified atrial fibrillation: Secondary | ICD-10-CM

## 2014-06-04 LAB — POCT INR: INR: 2.3

## 2014-06-04 NOTE — Progress Notes (Signed)
Pre visit review using our clinic review tool, if applicable. No additional management support is needed unless otherwise documented below in the visit note. 

## 2014-06-30 ENCOUNTER — Other Ambulatory Visit: Payer: Self-pay

## 2014-06-30 MED ORDER — AMLODIPINE BESYLATE 5 MG PO TABS: 5.0000 mg | ORAL_TABLET | Freq: Every day | ORAL | Status: DC

## 2014-07-02 ENCOUNTER — Other Ambulatory Visit: Payer: Self-pay | Admitting: General Practice

## 2014-07-02 ENCOUNTER — Ambulatory Visit (INDEPENDENT_AMBULATORY_CARE_PROVIDER_SITE_OTHER): Payer: Medicare Other | Admitting: General Practice

## 2014-07-02 ENCOUNTER — Telehealth: Payer: Self-pay | Admitting: Family Medicine

## 2014-07-02 DIAGNOSIS — I4891 Unspecified atrial fibrillation: Secondary | ICD-10-CM

## 2014-07-02 DIAGNOSIS — Z5181 Encounter for therapeutic drug level monitoring: Secondary | ICD-10-CM

## 2014-07-02 LAB — POCT INR: INR: 3.3

## 2014-07-02 MED ORDER — WARFARIN SODIUM 5 MG PO TABS: ORAL_TABLET | ORAL | Status: DC

## 2014-07-02 NOTE — Telephone Encounter (Signed)
[place] [place] is requesting re-fill on warfarin (COUMADIN) 5 MG tablet

## 2014-07-02 NOTE — Progress Notes (Signed)
Pre visit review using our clinic review tool, if applicable. No additional management support is needed unless otherwise documented below in the visit note. 

## 2014-07-11 ENCOUNTER — Encounter: Payer: Self-pay | Admitting: Cardiovascular Disease

## 2014-07-11 ENCOUNTER — Ambulatory Visit (INDEPENDENT_AMBULATORY_CARE_PROVIDER_SITE_OTHER): Payer: Medicare Other | Admitting: Cardiovascular Disease

## 2014-07-11 VITALS — BP 143/70 | HR 52 | Ht 62.0 in | Wt 134.0 lb

## 2014-07-11 DIAGNOSIS — I4891 Unspecified atrial fibrillation: Secondary | ICD-10-CM

## 2014-07-11 DIAGNOSIS — I359 Nonrheumatic aortic valve disorder, unspecified: Secondary | ICD-10-CM

## 2014-07-11 NOTE — Progress Notes (Signed)
HPI:  73 year old gentleman presenting for followup of aortic stenosis, CAD with history of MI and remote CABG, and paroxysmal atrial fibrillation. He has developed progressive aortic stenosis her last several years. He underwent right and left heart catheterization in 2013 for further evaluation. This demonstrated patency of his bypass grafts, moderately reduced LV function, and severe aortic stenosis. Because of minimal symptoms ongoing medical therapy was recommended. The patient has also been followed by Dr. Roxy Manns with cardiac surgery.   Mr. Bisig presents today with his wife. He has been doing fairly well. He does admit to generalized fatigue. He is able to do all of his normal activities without symptoms. Overall he feels well. He has noticed mild shortness of breath when walking up hills in the hot temperatures this summer. Otherwise he denies any complaints. He specifically denies chest pain or pressure, lightheadedness, dizziness, edema, orthopnea, or PND. He continues to walk regularly about 25 minutes in the mornings and then throughout the day at various times.  Outpatient Encounter Prescriptions as of 07/11/2014  Medication Sig  . amiodarone (PACERONE) 200 MG tablet Take 0.5 tablets (100 mg total) by mouth daily.  [name] amLODipine (NORVASC) 5 MG tablet Take 1 tablet (5 mg total) by mouth daily.  . Ascorbic Acid (VITAMIN C) 1000 MG tablet Take 1,000 mg by mouth daily.    [name] aspirin 81 MG tablet Take 81 mg by mouth daily.    [name] atorvastatin (LIPITOR) 80 MG tablet Take 0.5 tablets (40 mg total) by mouth daily.  [name] loratadine (CLARITIN) 10 MG tablet Take 10 mg by mouth daily as needed.    . Multiple Vitamins-Minerals (MULTIVITAMIN,TX-MINERALS) tablet Take 1 tablet by mouth daily.    [name] olmesartan (BENICAR) 40 MG tablet Take 40 mg by mouth daily.  . pantoprazole (PROTONIX) 40 MG tablet Take 1 tablet (40 mg total) by mouth daily.  [name] triamcinolone cream (KENALOG) 0.5 % Apply topically 3 (three)  times daily.  [name] warfarin (COUMADIN) 5 MG tablet Take as directed by coumadin clinic.  . [DISCONTINUED] amiodarone (PACERONE) 200 MG tablet Take 0.5 tablets (100 mg total) by mouth daily.  . [DISCONTINUED] amLODipine (NORVASC) 5 MG tablet Take 1 tablet (5 mg total) by mouth daily.  . [DISCONTINUED] losartan (COZAAR) 100 MG tablet Take 1 tablet (100 mg total) by mouth daily.  . [DISCONTINUED] warfarin (COUMADIN) 5 MG tablet Take as directed by coumadin clinic.    Allergies  Allergen Reactions  . Codeine Other (See Comments)    Pt does not remember  . Iodine Swelling    Past Medical History  Diagnosis Date  . Esophageal reflux   . Atrial fibrillation     holding sinus rhythm on Amiodarone  . Aortic stenosis     mild with a mean aortic valve gradient of 12 mmHg  . CAD (coronary artery disease) 1997    CABG  . Unspecified essential hypertension   . History of transesophageal echocardiography (TEE) for monitoring   . Cardiomyopathy   . Other and unspecified hyperlipidemia   . Skin lesions, generalized     facial which may represent actinic keratoses and possible photosensitivity from Amiodarone  . GERD (gastroesophageal reflux disease)   . History of colonoscopy   . 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 Acute myocardial infarction of inferior wall 1980   ROS: Negative except as per  HPI  BP 143/70  Pulse 52  Ht 5\' 2"  (1.575 m)  Wt 134 lb (60.782 kg)  BMI 24.50 kg/m2  PHYSICAL EXAM: Pt is alert and oriented, NAD HEENT: normal Neck: JVP - normal, carotids delayed with bilateral bruits Lungs: CTA bilaterally CV: Bradycardic and regular with a grade 4/6 late peaking crescendo decrescendo murmur at the right upper sternal border with an absent 82 Abd: soft, NT, Positive BS, no hepatomegaly Ext: no C/C/E, distal pulses intact and equal Skin: warm/dry no  rash  EKG:  Sinus bradycardia, left ventricular hypertrophy, age-indeterminate inferior and anterolateral infarcts, unchanged from previous tracings.  2D Echo 02/16/2014: Left ventricle: The cavity size was normal. Wall thickness was normal. Systolic function was mildly to moderately reduced. The estimated ejection fraction was in the range of 40% to 45%. Wall motion was normal; there were no regional wall motion abnormalities. Early diastolic septal annular tissue Doppler velocities Ea were abnormal.  ------------------------------------------------------------ Aortic valve: Moderately thickened, moderately calcified leaflets. Doppler: There was severe stenosis. No regurgitation. VTI ratio of LVOT to aortic valve: 0.16. Valve area: 0.64cm^2(VTI). Indexed valve area: 0.4cm^2/m^2 (VTI). Peak velocity ratio of LVOT to aortic valve: 0.16. Valve area: 0.68cm^2 (Vmax). Indexed valve area: 0.43cm^2/m^2 (Vmax). Mean gradient: 56mm Hg (S). Peak gradient: 65mm Hg (S).  ------------------------------------------------------------ Aorta: Aortic root: The aortic root was normal in size. Ascending aorta: The ascending aorta was normal in size.  ------------------------------------------------------------ Mitral valve: Mildly thickened leaflets . Doppler: Trivial regurgitation. Peak gradient: 50mm Hg (D).  ------------------------------------------------------------ Left atrium: The atrium was moderately dilated.  ------------------------------------------------------------ Right ventricle: The cavity size was normal. Systolic function was normal.  ------------------------------------------------------------ Pulmonic valve: The valve appears to be grossly normal. Doppler: No significant regurgitation.  ------------------------------------------------------------ Tricuspid valve: Structurally normal valve. Doppler: Mild-moderate  regurgitation.  ------------------------------------------------------------ Pulmonary artery: Systolic pressure was within the normal range.  ------------------------------------------------------------ Right atrium: The atrium was normal in size.  ------------------------------------------------------------ Pericardium: There was no pericardial effusion.  ------------------------------------------------------------ Systemic veins: Inferior vena cava: The vessel was normal in size; the respirophasic diameter changes were in the normal range (= 50%); findings are consistent with normal central venous pressure.  GXT 01/10/2014: Exercise Tolerance Test  Ordering MD: [name], MD  Interpreting MD: [name], MD   Unique Test No: 1  Treadmill: 1   Indication for ETT: known ASHD  Contraindication to ETT: No   Stress Modality: exercise - treadmill  Cardiac Imaging Performed: non   Protocol: standard Bruce - maximal  Max BP: 150/73   Max MPHR (bpm): 146  85% MPR (bpm): 124   MPHR obtained (bpm): 133  % MPHR obtained: 91   Reached 85% MPHR (min:sec): 7:20  Total Exercise Time (min-sec): 9:00   Workload in METS: 10.1  Borg Scale: 17   Reason ETT Terminated: fatigue    ST Segment Analysis  At Rest: normal ST segments - no evidence of significant ST depression  With Exercise: non-specific ST changes  Other Information  Arrhythmia: No Angina during ETT: absent (0)  Quality of ETT: diagnostic  ETT Interpretation: normal - no evidence of ischemia by ST analysis  Comments:  1. Good exercise tolerance  2. No angina, dyspnea, or lightheadedness at this level of exertion  3. BP drop at immediate exercise termination, rebounded within 2 minutes recovery  Recommendations:  Treadmill done for surveillance of severe aortic stenosis (asymptomatic). Overall low-risk study, but note BP drop without symptoms at exercise completion with rapid BP recovery. Echo to follow next week.  RISK  SCORES About the STS Risk Calculator Procedure: AV Replacement Risk of Mortality: 4.879% Morbidity or Mortality: 27.272% Long Length of Stay: 9.4% Short Length of Stay: 23.58% Permanent Stroke: 1.986% Prolonged Ventilation: 19.356% DSW Infection: 0.523% Renal Failure: 5.769%   ASSESSMENT AND PLAN: 1. Severe calcific aortic stenosis, minimal symptoms. The patient has undergone careful surveillance with exercise treadmill testing and serial echocardiography. He clearly has severe aortic stenosis, but he has performed well on his treadmill studies and describes minimal symptoms. However, he does admit to mild shortness of breath at times as well as fatigue. I asked him to be cautious and to avoid working or exercising outside during the hot and humid months. He and his wife were counseled about watching carefully for progressive symptoms of shortness of breath, exertional chest discomfort, lightheadedness, or worsening fatigue. Overall he remains stable and has no symptoms with moderate level activities. I am going to schedule him for a repeat exercise treadmill study, echocardiogram, and lab work to include BNP in February. Will then arrange followup in the multidisciplinary heart valve clinic along with Dr. Roxy Manns so that we can review treatment options after those studies are completed.  2. Coronary artery disease, native vessel. Bypass grafts were patent at last heart catheterization. Patient has no anginal symptoms.  3. Paroxysmal atrial fibrillation. The patient remains on long-term amiodarone at a dose of 100 mg daily. He is anticoagulated with warfarin and tolerating this without bleeding problems. He appears to be maintaining sinus rhythm.  4. Hyperlipidemia. Lipids are at goal on atorvastatin. He will have repeat lipids in about 6 months.  [name] MD 07/11/2014 11:16 AM

## 2014-07-11 NOTE — Patient Instructions (Addendum)
Your physician recommends that you return for lab work in February 2016 (Lipid, LIVER and BMP)  Your physician has requested that you have an echocardiogram in February 2016. Echocardiography is a painless test that uses sound waves to create images of your heart. It provides your doctor with information about the size and shape of your heart and how well your heart's chambers and valves are working. This procedure takes approximately one hour. There are no restrictions for this procedure.  Your physician has requested that you have an exercise tolerance test in February 2016. For further information please visit [email or web address]. Please also follow instruction sheet, as given.  Follow-up in Valve Clinic after these tests are complete.

## 2014-07-12 ENCOUNTER — Encounter: Payer: Self-pay | Admitting: Cardiovascular Disease

## 2014-07-30 ENCOUNTER — Ambulatory Visit (INDEPENDENT_AMBULATORY_CARE_PROVIDER_SITE_OTHER): Payer: Medicare Other | Admitting: *Deleted

## 2014-07-30 DIAGNOSIS — Z5181 Encounter for therapeutic drug level monitoring: Secondary | ICD-10-CM

## 2014-07-30 DIAGNOSIS — I4891 Unspecified atrial fibrillation: Secondary | ICD-10-CM

## 2014-07-30 LAB — POCT INR: INR: 3.3

## 2014-08-27 ENCOUNTER — Other Ambulatory Visit (INDEPENDENT_AMBULATORY_CARE_PROVIDER_SITE_OTHER): Payer: Medicare Other

## 2014-08-27 ENCOUNTER — Ambulatory Visit (INDEPENDENT_AMBULATORY_CARE_PROVIDER_SITE_OTHER): Payer: Medicare Other | Admitting: *Deleted

## 2014-08-27 ENCOUNTER — Ambulatory Visit (INDEPENDENT_AMBULATORY_CARE_PROVIDER_SITE_OTHER): Payer: Medicare Other

## 2014-08-27 DIAGNOSIS — Z0000 Encounter for general adult medical examination without abnormal findings: Secondary | ICD-10-CM

## 2014-08-27 DIAGNOSIS — Z23 Encounter for immunization: Secondary | ICD-10-CM

## 2014-08-27 DIAGNOSIS — E039 Hypothyroidism, unspecified: Secondary | ICD-10-CM

## 2014-08-27 DIAGNOSIS — E782 Mixed hyperlipidemia: Secondary | ICD-10-CM

## 2014-08-27 DIAGNOSIS — N401 Enlarged prostate with lower urinary tract symptoms: Secondary | ICD-10-CM

## 2014-08-27 DIAGNOSIS — I10 Essential (primary) hypertension: Secondary | ICD-10-CM

## 2014-08-27 DIAGNOSIS — I4891 Unspecified atrial fibrillation: Secondary | ICD-10-CM

## 2014-08-27 DIAGNOSIS — Z5181 Encounter for therapeutic drug level monitoring: Secondary | ICD-10-CM

## 2014-08-27 LAB — HEPATIC FUNCTION PANEL
ALBUMIN: 3.8 g/dL (ref 3.5–5.2)
ALT: 22 U/L (ref 0–53)
AST: 24 U/L (ref 0–37)
Alkaline Phosphatase: 110 U/L (ref 39–117)
BILIRUBIN DIRECT: 0.2 mg/dL (ref 0.0–0.3)
Total Bilirubin: 0.8 mg/dL (ref 0.2–1.2)
Total Protein: 6.4 g/dL (ref 6.0–8.3)

## 2014-08-27 LAB — POCT URINALYSIS DIPSTICK
Bilirubin, UA: NEGATIVE
Blood, UA: NEGATIVE
Glucose, UA: NEGATIVE
Ketones, UA: NEGATIVE
LEUKOCYTES UA: NEGATIVE
NITRITE UA: NEGATIVE
PH UA: 5
Protein, UA: NEGATIVE
Spec Grav, UA: 1.02
Urobilinogen, UA: 0.2

## 2014-08-27 LAB — BASIC METABOLIC PANEL
BUN: 16 mg/dL (ref 6–23)
CO2: 26 meq/L (ref 19–32)
CREATININE: 0.9 mg/dL (ref 0.4–1.5)
Calcium: 8.5 mg/dL (ref 8.4–10.5)
Chloride: 105 mEq/L (ref 96–112)
GFR: 85.12 mL/min (ref >60.00)
Glucose, Bld: 115 mg/dL — ABNORMAL HIGH (ref 70–99)
POTASSIUM: 3.7 meq/L (ref 3.5–5.1)
Sodium: 137 mEq/L (ref 135–145)

## 2014-08-27 LAB — CBC WITH DIFFERENTIAL/PLATELET
Basophils Absolute: 0 10*3/uL (ref 0.0–0.1)
Basophils Relative: 0.4 % (ref 0.0–3.0)
EOS PCT: 2.3 % (ref 0.0–5.0)
Eosinophils Absolute: 0.2 10*3/uL (ref 0.0–0.7)
HEMATOCRIT: 35.2 % — AB (ref 39.0–52.0)
Hemoglobin: 11.6 g/dL — ABNORMAL LOW (ref 13.0–17.0)
Lymphocytes Relative: 27.2 % (ref 12.0–46.0)
Lymphs Abs: 1.9 10*3/uL (ref 0.7–4.0)
MCHC: 33.1 g/dL (ref 30.0–36.0)
MCV: 98.5 fl (ref 78.0–100.0)
MONO ABS: 0.6 10*3/uL (ref 0.1–1.0)
Monocytes Relative: 9.3 % (ref 3.0–12.0)
NEUTROS ABS: 4.2 10*3/uL (ref 1.4–7.7)
NEUTROS PCT: 60.8 % (ref 43.0–77.0)
Platelets: 134 10*3/uL — ABNORMAL LOW (ref 150.0–400.0)
RBC: 3.57 Mil/uL — AB (ref 4.22–5.81)
RDW: 13.5 % (ref 11.5–15.5)
WBC: 6.8 10*3/uL (ref 4.0–10.5)

## 2014-08-27 LAB — LIPID PANEL
Cholesterol: 128 mg/dL (ref 0–200)
HDL: 37.4 mg/dL — ABNORMAL LOW (ref >39.00)
LDL Cholesterol: 66 mg/dL (ref 0–99)
NONHDL: 90.6
Total CHOL/HDL Ratio: 3
Triglycerides: 121 mg/dL (ref 0.0–149.0)
VLDL: 24.2 mg/dL (ref 0.0–40.0)

## 2014-08-27 LAB — PSA: PSA: 1.25 ng/mL (ref 0.10–4.00)

## 2014-08-27 LAB — POCT INR: INR: 2.1

## 2014-08-27 LAB — TSH: TSH: 2.61 u[IU]/mL (ref 0.35–4.50)

## 2014-08-27 NOTE — Addendum Note (Signed)
Addended by: Elmer Picker on: 08/27/2014 01:37 PM   Modules accepted: Orders

## 2014-09-03 ENCOUNTER — Ambulatory Visit (INDEPENDENT_AMBULATORY_CARE_PROVIDER_SITE_OTHER): Payer: Medicare Other | Admitting: Family Medicine

## 2014-09-03 VITALS — BP 132/68 | HR 60 | Temp 97.8°F | Ht 62.0 in | Wt 136.0 lb

## 2014-09-03 DIAGNOSIS — Z0000 Encounter for general adult medical examination without abnormal findings: Secondary | ICD-10-CM

## 2014-09-03 DIAGNOSIS — E785 Hyperlipidemia, unspecified: Secondary | ICD-10-CM

## 2014-09-03 DIAGNOSIS — D539 Nutritional anemia, unspecified: Secondary | ICD-10-CM | POA: Insufficient documentation

## 2014-09-03 DIAGNOSIS — I350 Nonrheumatic aortic (valve) stenosis: Secondary | ICD-10-CM

## 2014-09-03 DIAGNOSIS — I359 Nonrheumatic aortic valve disorder, unspecified: Secondary | ICD-10-CM

## 2014-09-03 DIAGNOSIS — I4891 Unspecified atrial fibrillation: Secondary | ICD-10-CM

## 2014-09-03 DIAGNOSIS — I10 Essential (primary) hypertension: Secondary | ICD-10-CM

## 2014-09-03 DIAGNOSIS — D509 Iron deficiency anemia, unspecified: Secondary | ICD-10-CM | POA: Insufficient documentation

## 2014-09-03 DIAGNOSIS — D649 Anemia, unspecified: Secondary | ICD-10-CM | POA: Insufficient documentation

## 2014-09-03 LAB — VITAMIN B12: Vitamin B-12: 617 pg/mL (ref 211–911)

## 2014-09-03 NOTE — Progress Notes (Signed)
Subjective:    Patient ID: Jesus [name], male    DOB: Apr 21, 1941, 73 y.o.   MRN: [id number]  HPI Patient seen for wellness visit and medical followup. He has chronic problems including ischemic heart disease, severe aortic stenosis, hypertension, atrial fibrillation, hyperlipidemia, BPH, GERD. He is followed regularly by CVTS and cardiology. No recent increased dyspnea and no recent chest pain.  Hypertension has been stable. We recently switched him from Benicar to losartan for cost issues. His blood pressures remain well controlled. He has history of mild normocytic anemia in the past. Colonoscopy 4 years ago. Immunizations are all up to date. He's already had flu vaccine. Medications reviewed. Compliant with all. No recent bleeding complications.  Past Medical History  Diagnosis Date  . Esophageal reflux   . Atrial fibrillation     holding sinus rhythm on Amiodarone  . Aortic stenosis     mild with a mean aortic valve gradient of 12 mmHg  . CAD (coronary artery disease) 1997    CABG  . Unspecified essential hypertension   . History of transesophageal echocardiography (TEE) for monitoring   . Cardiomyopathy   . Other and unspecified hyperlipidemia   . Skin lesions, generalized     facial which may represent actinic keratoses and possible photosensitivity from Amiodarone  . GERD (gastroesophageal reflux disease)   . History of colonoscopy   . 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 Acute myocardial infarction of inferior wall 1980   Past Surgical History  Procedure Laterality Date  . Tonsillectomy    . Coronary artery bypass graft  02/09/1996    LIMA to diag-LAD, SVG to OM1-OM2-OM3, SVG to [facility]  . Cholecystectomy    . Cardioversion  11/18/2006    Dr. [name]  . Cataract extraction w/ intraocular lens implant  April '13  (Dr. [name])    left eye  only    reports that he has never smoked. He has never used smokeless tobacco. He reports that he does not drink alcohol or use illicit drugs. family history includes Atrial fibrillation in his brother; Crohn's disease in his mother; Heart attack (age of onset: 38) in his father; Heart disease in his father and paternal uncle; Heart failure in his father; Prostate cancer in his paternal uncle; Stroke (age of onset: 107) in his mother. Allergies  Allergen Reactions  . Codeine Other (See Comments)    Pt does not remember  . Iodine Swelling   1.  Risk factors based on Past Medical , Social, and Family history reviewed and as indicated above with no changes 2.  Limitations in physical activities None.  No recent falls. 3.  Depression/mood No active depression or anxiety issues 4.  Hearing chronic bilateral hearing loss. He has bilateral hearing aids. 5.  ADLs independent in all. 6.  Cognitive function (orientation to time and place, language, writing, speech,memory) no short or long term memory issues.  Language and judgement intact. 7.  Home Safety no issues 8.  Height, weight, and visual acuity.all stable. 9.  Counseling discussed routine immunizations. 10. Recommendation of preventive services.  All vaccines up to date. 11. Labs based on risk factors serum iron level and TIBC and B12 12. Care Plan as above. 13. Other Providers [name] (cardiology) 14. Written schedule of screening/prevention services given to patient.     Review of Systems  Constitutional:  Negative for fatigue.  HENT: Negative for trouble swallowing.   Eyes: Negative for visual disturbance.  Respiratory: Negative for cough, chest tightness and shortness of breath.   Cardiovascular: Negative for chest pain, palpitations and leg swelling.  Gastrointestinal: Negative for abdominal pain.  Endocrine: Negative for polydipsia and polyuria.  Genitourinary: Negative for dysuria.  Neurological: Negative for dizziness,  syncope, weakness, light-headedness and headaches.  Hematological: Negative for adenopathy. Does not bruise/bleed easily.       Objective:   Physical Exam  Constitutional: He is oriented to person, place, and time. He appears well-developed and well-nourished. No distress.  HENT:  Head: Normocephalic and atraumatic.  Right Ear: External ear normal.  Left Ear: External ear normal.  Mouth/Throat: Oropharynx is clear and moist.  Eyes: Conjunctivae and EOM are normal. Pupils are equal, round, and reactive to light.  Neck: Normal range of motion. Neck supple. No thyromegaly present.  Cardiovascular: Normal rate and regular rhythm.   Murmur heard. Pulmonary/Chest: No respiratory distress. He has no wheezes. He has no rales.  Abdominal: Soft. Bowel sounds are normal. He exhibits no distension and no mass. There is no tenderness. There is no rebound and no guarding.  Musculoskeletal: He exhibits no edema.  Lymphadenopathy:    He has no cervical adenopathy.  Neurological: He is alert and oriented to person, place, and time. He displays normal reflexes. No cranial nerve deficit.  Skin: No rash noted.  Psychiatric: He has a normal mood and affect. His behavior is normal.          Assessment & Plan:  #1 wellness issues addressed. Immunizations up to date. #2 mild normocytic anemia. Suspect anemia of chronic disease. Check serum iron, B12, and TIBC. Hemoccult cards given #3 hypertension which is well-controlled #4 hyperlipidemia with lipids at goal #5 severe aortic stenosis symptomatically stable and followed regularly by cardiology

## 2014-09-03 NOTE — Patient Instructions (Signed)
Continue with yearly flu vaccine Repeat colonoscopy by next year Complete hemoccult cards.

## 2014-09-03 NOTE — Progress Notes (Signed)
Pre visit review using our clinic review tool, if applicable. No additional management support is needed unless otherwise documented below in the visit note. 

## 2014-09-04 LAB — IRON AND TIBC
%SAT: 30 % (ref 20–55)
Iron: 82 ug/dL (ref 42–165)
TIBC: 273 ug/dL (ref 215–435)
UIBC: 191 ug/dL (ref 125–400)

## 2014-09-10 ENCOUNTER — Other Ambulatory Visit (INDEPENDENT_AMBULATORY_CARE_PROVIDER_SITE_OTHER): Payer: Medicare Other

## 2014-09-10 DIAGNOSIS — R195 Other fecal abnormalities: Secondary | ICD-10-CM

## 2014-09-10 LAB — POC HEMOCCULT BLD/STL (HOME/3-CARD/SCREEN)
CARD #3 DATE: NEGATIVE
Card #1 Date: NEGATIVE
Card #2 Date: NEGATIVE
FECAL OCCULT BLD: NEGATIVE
FECAL OCCULT BLD: NEGATIVE
Fecal Occult Blood, POC: NEGATIVE

## 2014-09-24 ENCOUNTER — Ambulatory Visit (INDEPENDENT_AMBULATORY_CARE_PROVIDER_SITE_OTHER): Payer: Medicare Other | Admitting: *Deleted

## 2014-09-24 DIAGNOSIS — Z5181 Encounter for therapeutic drug level monitoring: Secondary | ICD-10-CM

## 2014-09-24 DIAGNOSIS — I4891 Unspecified atrial fibrillation: Secondary | ICD-10-CM

## 2014-09-24 LAB — POCT INR: INR: 2.4

## 2014-10-02 ENCOUNTER — Telehealth: Payer: Self-pay | Admitting: Family Medicine

## 2014-10-02 MED ORDER — ATORVASTATIN CALCIUM 80 MG PO TABS: 40.0000 mg | ORAL_TABLET | Freq: Every day | ORAL | Status: DC

## 2014-10-02 NOTE — Telephone Encounter (Signed)
RX sent to pharmacy  

## 2014-10-02 NOTE — Telephone Encounter (Signed)
[place] [place] is requesting re-fill on atorvastatin (LIPITOR) 80 MG tablet

## 2014-10-27 ENCOUNTER — Telehealth: Payer: Self-pay | Admitting: Cardiovascular Disease

## 2014-10-27 DIAGNOSIS — I480 Paroxysmal atrial fibrillation: Secondary | ICD-10-CM

## 2014-10-27 DIAGNOSIS — I350 Nonrheumatic aortic (valve) stenosis: Secondary | ICD-10-CM

## 2014-10-27 NOTE — Telephone Encounter (Signed)
I will place order for echo, gxt, lipid, liver, bmp and bnp to be performed in February 2016.

## 2014-10-27 NOTE — Telephone Encounter (Signed)
New Msg   Patient would like to know about having an ultrasound completed in February at the same time as his treadmill test. Please contact at [phone]

## 2014-10-29 ENCOUNTER — Encounter: Payer: Self-pay | Admitting: Cardiovascular Disease

## 2014-10-29 NOTE — Telephone Encounter (Signed)
Tests scheduled 01/21/15. The pt's note says follow-up with the Valve Clinic.  I will forward this message to [name] to determine if he would like to see the pt in our office for an appointment.

## 2014-10-29 NOTE — Telephone Encounter (Signed)
This encounter was created in error - please disregard.

## 2014-10-29 NOTE — Telephone Encounter (Signed)
New Msg   Patient calling states he is confused and was under impression that his next appt on 01/30/2014 was with Dr. [name] along with Dr. Roxy Manns. Patient states Dr. [name] informed him that both would be present. Please contact patient at [phone]

## 2014-10-29 NOTE — Telephone Encounter (Signed)
Pt scheduled to see [name] 01/30/15.

## 2014-10-29 NOTE — Telephone Encounter (Signed)
Yes - let's set him up to see me after the echo at [facility] office. thx

## 2014-11-05 ENCOUNTER — Telehealth: Payer: Self-pay | Admitting: Family Medicine

## 2014-11-05 ENCOUNTER — Other Ambulatory Visit: Payer: Self-pay

## 2014-11-05 ENCOUNTER — Ambulatory Visit (INDEPENDENT_AMBULATORY_CARE_PROVIDER_SITE_OTHER): Payer: Medicare Other

## 2014-11-05 DIAGNOSIS — I4891 Unspecified atrial fibrillation: Secondary | ICD-10-CM

## 2014-11-05 DIAGNOSIS — Z5181 Encounter for therapeutic drug level monitoring: Secondary | ICD-10-CM

## 2014-11-05 LAB — POCT INR: INR: 2.4

## 2014-11-05 MED ORDER — AMLODIPINE BESYLATE 5 MG PO TABS: 5.0000 mg | ORAL_TABLET | Freq: Every day | ORAL | Status: DC

## 2014-11-05 MED ORDER — PANTOPRAZOLE SODIUM 40 MG PO TBEC: 40.0000 mg | DELAYED_RELEASE_TABLET | Freq: Every day | ORAL | Status: DC

## 2014-11-05 NOTE — Telephone Encounter (Signed)
Error

## 2014-11-20 ENCOUNTER — Other Ambulatory Visit: Payer: Medicare Other

## 2014-11-25 ENCOUNTER — Other Ambulatory Visit: Payer: Self-pay

## 2014-11-25 MED ORDER — AMIODARONE HCL 200 MG PO TABS: 100.0000 mg | ORAL_TABLET | Freq: Every day | ORAL | Status: DC

## 2014-12-15 ENCOUNTER — Encounter: Payer: Self-pay | Admitting: Family Medicine

## 2014-12-15 ENCOUNTER — Ambulatory Visit (INDEPENDENT_AMBULATORY_CARE_PROVIDER_SITE_OTHER): Payer: Medicare Other | Admitting: Family Medicine

## 2014-12-15 VITALS — BP 110/66 | HR 67 | Temp 98.4°F

## 2014-12-15 DIAGNOSIS — M5416 Radiculopathy, lumbar region: Secondary | ICD-10-CM

## 2014-12-15 MED ORDER — HYDROCODONE-ACETAMINOPHEN 5-325 MG PO TABS: 1.0000 | ORAL_TABLET | Freq: Four times a day (QID) | ORAL | Status: DC | PRN

## 2014-12-15 NOTE — Progress Notes (Signed)
Subjective:    Patient ID: Jesus [name], male    DOB: [birth year], 74 y.o.   MRN: [id number]  HPI Acute visit. Left lower extremity pain and possibly some weakness with acute onset this morning. Denies any known injury. He has occasional lumbar back pains. Pain is described as achy and 9 out of 10 severity at its worst. His pain is worse with walking or leg movement. Had episode of possible weakness this morning where he went to the ground but no injury. His location of pain is left buttock down to midway down the leg. No numbness. No stool incontinence. He has some occasional mild urine incontinence which is chronic but unchanged. Took 2 Tylenol this morning without help. No claudication symptoms. He avoids nonsteroidals with chronic Coumadin. No leg edema.  He had not had any recent falls or other injury. No recent appetite or weight changes.  Multiple chronic medical problems which are reviewed as below:  Past Medical History  Diagnosis Date  . Esophageal reflux   . Atrial fibrillation     holding sinus rhythm on Amiodarone  . Aortic stenosis     mild with a mean aortic valve gradient of 12 mmHg  . CAD (coronary artery disease) 1997    CABG  . Unspecified essential hypertension   . History of transesophageal echocardiography (TEE) for monitoring   . Cardiomyopathy   . Other and unspecified hyperlipidemia   . Skin lesions, generalized     facial which may represent actinic keratoses and possible photosensitivity from Amiodarone  . GERD (gastroesophageal reflux disease)   . History of colonoscopy   . 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 Acute myocardial infarction of inferior wall 1980   Past Surgical History  Procedure Laterality Date  . Tonsillectomy    . Coronary artery bypass graft  02/09/1996    LIMA to diag-LAD, SVG to OM1-OM2-OM3, SVG to [facility]  .  Cholecystectomy    . Cardioversion  11/18/2006    Dr. [name]  . Cataract extraction w/ intraocular lens implant  April '13  (Dr. [name])    left eye only    reports that he has never smoked. He has never used smokeless tobacco. He reports that he does not drink alcohol or use illicit drugs. family history includes Atrial fibrillation in his brother; Crohn's disease in his mother; Heart attack (age of onset: 17) in his father; Heart disease in his father and paternal uncle; Heart failure in his father; Prostate cancer in his paternal uncle; Stroke (age of onset: 45) in his mother. Allergies  Allergen Reactions  . Codeine Other (See Comments)    Pt does not remember  . Iodine Swelling      Review of Systems  Constitutional: Negative for fever, chills, appetite change and unexpected weight change.  Respiratory: Negative for cough and shortness of breath.   Cardiovascular: Negative for chest pain and leg swelling.  Gastrointestinal: Negative for nausea, vomiting and abdominal pain.  Genitourinary: Negative for dysuria.  Musculoskeletal: Positive for back pain.  Neurological: Positive for weakness. Negative for numbness.  Hematological: Negative for adenopathy.       Objective:   Physical Exam  Constitutional: He appears well-developed and well-nourished.  Cardiovascular: Normal rate.   Pulmonary/Chest: Effort normal and breath sounds normal. No respiratory distress. He has no wheezes. He has no rales.  Musculoskeletal:  He exhibits no edema.  Straight leg raises are negative. No significant edema with difficulty getting him on table for good hip exam but did not seem to have any pain with external or internal rotation sitting in wheelchair  Neurological:  Deep tendon reflexes are 2+ and symmetric knee and trace ankle bilaterally. Question of some mild weakness with left knee extension versus reduced secondary to pain. Good strength with dorsiflexion and plantar flexion  bilateral          Assessment & Plan:  Left lower extremity pain. 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 Avoid nonsteroidals with chronic Coumadin use. Wrote for limited hydrocodone 5 mg 1-2 every 6 hours for severe pain #40 with no refill. May need x-rays including MRI to further assess especially if he develops any progressive weakness. We'll plan follow-up in 2 weeks and reassess then.  He is certainly not a good surgical candidate as he has severe aortic stenosis and hopefully this will improve with conservative management/observation.

## 2014-12-15 NOTE — Patient Instructions (Signed)

## 2014-12-15 NOTE — Progress Notes (Signed)
Pre visit review using our clinic review tool, if applicable. No additional management support is needed unless otherwise documented below in the visit note. 

## 2014-12-23 ENCOUNTER — Ambulatory Visit (INDEPENDENT_AMBULATORY_CARE_PROVIDER_SITE_OTHER): Payer: Medicare Other

## 2014-12-23 DIAGNOSIS — I4891 Unspecified atrial fibrillation: Secondary | ICD-10-CM | POA: Diagnosis not present

## 2014-12-23 DIAGNOSIS — Z5181 Encounter for therapeutic drug level monitoring: Secondary | ICD-10-CM

## 2014-12-23 LAB — POCT INR: INR: 2

## 2014-12-29 ENCOUNTER — Ambulatory Visit: Payer: Medicare Other | Admitting: Family Medicine

## 2014-12-30 ENCOUNTER — Telehealth: Payer: Self-pay

## 2014-12-30 MED ORDER — WARFARIN SODIUM 5 MG PO TABS: ORAL_TABLET | ORAL | Status: DC

## 2014-12-30 NOTE — Telephone Encounter (Signed)
Rx sent to [facility].

## 2014-12-30 NOTE — Telephone Encounter (Signed)
Rx request for warfarin sodium 5 mg tablet #30  Pharm:  [facility]  Pls advise.

## 2015-01-13 ENCOUNTER — Telehealth: Payer: Self-pay

## 2015-01-13 MED ORDER — LOSARTAN POTASSIUM 100 MG PO TABS
100.0000 mg | ORAL_TABLET | Freq: Every day | ORAL | Status: DC
Start: 2015-01-13 — End: 2015-07-15

## 2015-01-13 NOTE — Telephone Encounter (Signed)
[facility] refill request for LOSARTAN 100MG  #30

## 2015-01-13 NOTE — Telephone Encounter (Signed)
Rx sent to pharmacy   

## 2015-01-21 ENCOUNTER — Other Ambulatory Visit (INDEPENDENT_AMBULATORY_CARE_PROVIDER_SITE_OTHER): Payer: Medicare Other | Admitting: *Deleted

## 2015-01-21 ENCOUNTER — Encounter: Payer: Medicare Other | Admitting: Cardiovascular Disease

## 2015-01-21 ENCOUNTER — Encounter: Payer: Self-pay | Admitting: Cardiovascular Disease

## 2015-01-21 ENCOUNTER — Ambulatory Visit (HOSPITAL_COMMUNITY): Payer: Medicare Other | Attending: Cardiology | Admitting: Cardiology

## 2015-01-21 ENCOUNTER — Ambulatory Visit (INDEPENDENT_AMBULATORY_CARE_PROVIDER_SITE_OTHER): Payer: Medicare Other | Admitting: Cardiovascular Disease

## 2015-01-21 VITALS — BP 142/68 | HR 53 | Ht 62.0 in | Wt 133.0 lb

## 2015-01-21 DIAGNOSIS — I350 Nonrheumatic aortic (valve) stenosis: Secondary | ICD-10-CM

## 2015-01-21 DIAGNOSIS — I480 Paroxysmal atrial fibrillation: Secondary | ICD-10-CM

## 2015-01-21 DIAGNOSIS — R0609 Other forms of dyspnea: Secondary | ICD-10-CM

## 2015-01-21 DIAGNOSIS — E783 Hyperchylomicronemia: Secondary | ICD-10-CM

## 2015-01-21 LAB — LIPID PANEL
Cholesterol: 149 mg/dL (ref 0–200)
HDL: 49.4 mg/dL (ref >39.00)
LDL Cholesterol: 80 mg/dL (ref 0–99)
NonHDL: 99.6
TRIGLYCERIDES: 99 mg/dL (ref 0.0–149.0)
Total CHOL/HDL Ratio: 3
VLDL: 19.8 mg/dL (ref 0.0–40.0)

## 2015-01-21 LAB — BASIC METABOLIC PANEL
BUN: 16 mg/dL (ref 6–23)
CO2: 27 mEq/L (ref 19–32)
Calcium: 9.3 mg/dL (ref 8.4–10.5)
Chloride: 105 mEq/L (ref 96–112)
Creatinine, Ser: 0.77 mg/dL (ref 0.40–1.50)
GFR: 104.41 mL/min (ref >60.00)
GLUCOSE: 85 mg/dL (ref 70–99)
Potassium: 4.1 mEq/L (ref 3.5–5.1)
SODIUM: 139 meq/L (ref 135–145)

## 2015-01-21 LAB — HEPATIC FUNCTION PANEL
ALT: 18 U/L (ref 0–53)
AST: 20 U/L (ref 0–37)
Albumin: 4 g/dL (ref 3.5–5.2)
Alkaline Phosphatase: 110 U/L (ref 39–117)
Bilirubin, Direct: 0.1 mg/dL (ref 0.0–0.3)
Total Bilirubin: 0.7 mg/dL (ref 0.2–1.2)
Total Protein: 7 g/dL (ref 6.0–8.3)

## 2015-01-21 LAB — CBC
HEMATOCRIT: 39 % (ref 39.0–52.0)
Hemoglobin: 13.1 g/dL (ref 13.0–17.0)
MCHC: 33.5 g/dL (ref 30.0–36.0)
MCV: 96.3 fl (ref 78.0–100.0)
Platelets: 130 10*3/uL — ABNORMAL LOW (ref 150.0–400.0)
RBC: 4.05 Mil/uL — AB (ref 4.22–5.81)
RDW: 14 % (ref 11.5–15.5)
WBC: 7.7 10*3/uL (ref 4.0–10.5)

## 2015-01-21 LAB — BRAIN NATRIURETIC PEPTIDE: Pro B Natriuretic peptide (BNP): 339 pg/mL — ABNORMAL HIGH (ref 0.0–100.0)

## 2015-01-21 NOTE — Patient Instructions (Signed)
Your physician has requested that you have a cardiac catheterization. Cardiac catheterization is used to diagnose and/or treat various heart conditions. Doctors may recommend this procedure for a number of different reasons. The most common reason is to evaluate chest pain. Chest pain can be a symptom of coronary artery disease (CAD), and cardiac catheterization can show whether plaque is narrowing or blocking your heart's arteries. This procedure is also used to evaluate the valves, as well as measure the blood flow and oxygen levels in different parts of your heart. For further information please visit [email or web address]. Please follow instruction sheet, as given.  Your physician recommends that you continue on your current medications as directed. Please refer to the Current Medication list given to you today.  You have been referred to Dr Roxy Manns for further evaluation of Aortic Stenosis.

## 2015-01-21 NOTE — Progress Notes (Signed)
Echo performed. 

## 2015-01-21 NOTE — Progress Notes (Signed)
This encounter was created in error - please disregard.

## 2015-01-23 NOTE — Progress Notes (Signed)
Cardiology Office Note Date:  01/21/2015   ID:  Jesus [name], DOB 1941/03/04, MRN [id number]  PCP:  [name], MD  Cardiologist:  [name], MD    CC: Shortness of breath    History of Present Illness: Jesus [name] is a 74 y.o. male who presents for follow-up of severe aortic stenosis. He has a history of CAD s/p remote CABG with patent grafts when he underwent cardiac catheterization in 2013. Also has paroxysmal atrial fibrillation. He has been followed with serial echocardiograms and exercise treadmill testing in the setting of known severe aortic stenosis with minimal symptoms. He presented today for a treadmill study and 2D echo. However, when I reviewed his echo which demonstrated significant progression of what is now critical aortic stenosis, I cancelled his treadmill study and he presents for further discussion in the office. He is here with his wife today.  He now feels shortness of breath with walking up a hill. He also admits to increasing fatigue and feels more 'tired' in the evenings. He used to jog 25-30 minutes on the treadmill but over the past year he has slowed down to a 20 minute walk because of exercise intolerance. His wife also notes that he has 'slowed down' over the past year. He has lightheadedness, but denies chest pain or pressure. No syncope. No other complaints.  Past Medical History  Diagnosis Date  . Esophageal reflux   . Atrial fibrillation     holding sinus rhythm on Amiodarone  . Aortic stenosis     mild with a mean aortic valve gradient of 12 mmHg  . CAD (coronary artery disease) 1997    CABG  . Unspecified essential hypertension   . History of transesophageal echocardiography (TEE) for monitoring   . Cardiomyopathy   . Other and unspecified hyperlipidemia   . Skin lesions, generalized     facial which may represent actinic keratoses and possible photosensitivity from Amiodarone  . GERD (gastroesophageal reflux disease)   .  History of colonoscopy   . 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 Acute myocardial infarction of inferior wall 1980    Past Surgical History  Procedure Laterality Date  . Tonsillectomy    . Coronary artery bypass graft  02/09/1996    LIMA to diag-LAD, SVG to OM1-OM2-OM3, SVG to [facility]  . Cholecystectomy    . Cardioversion  11/18/2006    Dr. [name]  . Cataract extraction w/ intraocular lens implant  April '13  (Dr. [name])    left eye only    Current Outpatient Prescriptions  Medication Sig Dispense Refill  . amiodarone (PACERONE) 200 MG tablet Take 0.5 tablets (100 mg total) by mouth daily. 30 tablet 2  . amLODipine (NORVASC) 5 MG tablet Take 1 tablet (5 mg total) by mouth daily. 30 tablet 3  . Ascorbic Acid (VITAMIN C) 1000 MG tablet Take 1,000 mg by mouth daily.      [name] aspirin 81 MG tablet Take 81 mg by mouth daily.      [name] atorvastatin (LIPITOR) 80 MG tablet Take 0.5 tablets (40 mg total) by mouth daily. 45 tablet 5  . loratadine (CLARITIN) 10 MG tablet Take 10 mg by mouth daily as needed.      [name] losartan (COZAAR) 100 MG tablet Take 1 tablet (100 mg total) by mouth daily. 30 tablet 5  .  Multiple Vitamins-Minerals (MULTIVITAMIN,TX-MINERALS) tablet Take 1 tablet by mouth daily.      . pantoprazole (PROTONIX) 40 MG tablet Take 1 tablet (40 mg total) by mouth daily. 90 tablet 2  . triamcinolone cream (KENALOG) 0.5 % Apply topically 3 (three) times daily. 60 g 1  . warfarin (COUMADIN) 5 MG tablet Take as directed by coumadin clinic. 30 tablet 3   No current facility-administered medications for this visit.    Allergies:   Codeine and Iodine   Social History:  The patient  reports that he has never smoked. He has never used smokeless tobacco. He reports that he does not drink alcohol or use illicit drugs.   Family History:  The patient's family history  includes Atrial fibrillation in his brother; Crohn's disease in his mother; Heart attack (age of onset: 6) in his father; Heart disease in his father and paternal uncle; Heart failure in his father; Prostate cancer in his paternal uncle; Stroke (age of onset: 37) in his mother.    ROS:  Please see the history of present illness.  Otherwise, review of systems is positive for constipation, exertional dyspnea, fatigue, and bilateral leg pain.  All other systems are reviewed and negative.    PHYSICAL EXAM: VS:  BP 142/68, HR 53, O2 98, Weight 60 kg, Ht 1.58 m GEN: Well nourished, well developed, in no acute distress HEENT: normal Neck: no JVD, no masses. Carotid upstrokes are delayed and diminished. Cardiac: bradycardic with 4/6 harsh late-peaking systolic murmur at the LSB            Respiratory:  clear to auscultation bilaterally, normal work of breathing GI: soft, nontender, nondistended, + BS MS: no deformity or atrophy Ext: no pretibial edema Skin: warm and dry, no rash Neuro:  Strength and sensation are intact Psych: euthymic mood, full affect  EKG:  EKG is ordered today. The ekg ordered today shows Sinus Bradycardia with age-indeterminate inferolateral MI, no change from previous tracings  Recent Labs: 08/27/2014: ALT 22; BUN 16; Creatinine 0.9; Hemoglobin 11.6*; Platelets 134.0*; Potassium 3.7; Sodium 137; TSH 2.61   Lipid Panel     Component Value Date/Time   CHOL 128 08/27/2014 1335   TRIG 121.0 08/27/2014 1335   TRIG 67 11/20/2006 0748   HDL 37.40* 08/27/2014 1335   CHOLHDL 3 08/27/2014 1335   CHOLHDL 3.2 CALC 11/20/2006 0748   VLDL 24.2 08/27/2014 1335   LDLCALC 66 08/27/2014 1335      Wt Readings from Last 3 Encounters:  09/03/14 136 lb (61.689 kg)  07/11/14 134 lb (60.782 kg)  04/07/14 138 lb (62.596 kg)     Cardiac Studies Reviewed: Cardiac Catheterization 08/02/2012: Procedural Findings: Hemodynamics RA 3 RV 21/5 PA 23/6 with a mean of 12 PCWP A wave 7  V wave 5 mean of 5 LV 137/16 AO 100/50 with a mean of 71  Oxygen saturations: PA 70 AO 93  Cardiac Output (Fick) 5.2 Cardiac Index (Fick) 3.2  Aortic valve hemodynamics: Mean gradient 44, valve area 0.94 square centimeters  Coronary angiography: Coronary dominance: right  Left mainstem: The left mainstem is densely calcified. There is 90% distal left main stenosis the  Left anterior descending (LAD): The LAD is densely calcified. The vessel is totally occluded at the first septal perforator.  Left circumflex (LCx): The left circumflex is densely calcified. The vessel is totally occluded prior to the first obtuse marginal.  Right coronary artery (RCA): The right coronary artery is heavily calcified. The vessel is totally occluded at  its origin.  Saphenous vein graft sequence to acute marginals 1 and 2 in the right PDA is patent. There are some irregularities but no significant stenoses throughout the body of the vein graft. The acute marginal branches are patent. The PDA is patent. The posterior AV segment is severely diseased but several posterolateral branches fill from this graft to  Saphenous vein graft sequence to OM1, OM 2, and ON 3 is patent. The body of the graft has no significant disease. The proximal OM branches are tiny. The major branches the OM 3 which is patent. There is a high-grade 90% stenosis in the midportion of that obtuse marginal target vessel. Beyond the area of severe stenosis the vessel divides into twin branches.  LIMA to LAD: This graft was imaged subselectively from the left subclavian because of severe subclavian tortuosity. The LIMA is widely patent. The LAD and diagonal branches remain patent and the anastomotic site appears widely patent.  Left ventriculography: Left ventricular function is severely depressed. There is dense akinesis of the basal and midinferior walls. There is hypokinesis of anterolateral wall and apex. The left ventricular ejection  fraction is estimated at 35%. There is no significant mitral regurgitation.  Aortic valve: The aortic valve is severely calcified. There is restricted opening of the aortic valve leaflets which is well seen on plain fluoroscopy.  Final Conclusions:  1. Severe native three-vessel coronary artery disease with total occlusion of all major epicardial coronary vessels 2. Status post aortocoronary bypass surgery with continued patency of all grafts. Diffuse small vessel disease as described, primarily in the posterolateral branches and third obtuse marginal branch  3. Severe calcific aortic stenosis with a mean gradient of 44 mm mercury and aortic valve area of 0.9 4. Severe segmental left ventricular dysfunction with an estimated LVEF of 35%  Recommendations: Jesus [name] has severe aortic stenosis with at least moderately severe LV dysfunction. Despite his low ejection fraction, his cardiac output is well maintained based on Fick measurements. He is minimally symptomatic. The major question is whether to proceed with aortic valve replacement at this time considering his LV dysfunction and slowly progressive severe aortic stenosis. I am going to refer him for a cardiac surgery evaluation.  2D Echo: Left ventricle: The cavity size was normal. Systolic function was mildly reduced. The estimated ejection fraction was in the range of 45% to 50%. Regional wall motion abnormalities:  There is akinesis of the midinferior myocardium. Doppler parameters are consistent with elevated ventricular end-diastolic filling pressure.  ------------------------------------------------------------------- Aortic valve:  Trileaflet. Severe thickening and calcification. Valve mobility was severely restricted and the right coronary cusp appears fixed. Doppler:  There was critical stenosis.  There was trivial regurgitation.  VTI ratio of LVOT to aortic valve: 0.12. Valve area (VTI): 0.38 cm^2. Indexed valve  area (VTI): 0.23 cm^2/m^2. Valve area (Vmax): 0.4 cm^2. Indexed valve area (Vmax): 0.24 cm^2/m^2. Mean velocity ratio of LVOT to aortic valve: 0.12. Valve area (Vmean): 0.39 cm^2. Indexed valve area (Vmean): 0.24 cm^2/m^2.  Mean gradient (S): 57 mm Hg. Peak gradient (S): 104 mm Hg.  ------------------------------------------------------------------- Aorta: Aortic root: The aortic root was normal in size.  ------------------------------------------------------------------- Mitral valve:  Mildly thickened leaflets . Mobility was not restricted. Doppler: Transvalvular velocity was within the normal range. There was no evidence for stenosis. There was mild regurgitation.  Peak gradient (D): 4 mm Hg.  ------------------------------------------------------------------- Left atrium: The atrium was severely dilated.  ------------------------------------------------------------------- Right ventricle: The cavity size was normal. Wall thickness was normal. Systolic function  was normal.  ------------------------------------------------------------------- Pulmonic valve:  Structurally normal valve.  Cusp separation was normal. Doppler: Transvalvular velocity was within the normal range. There was no evidence for stenosis. There was no regurgitation.  ------------------------------------------------------------------- Tricuspid valve:  Structurally normal valve.  Doppler: Transvalvular velocity was within the normal range. There was mild regurgitation.  ------------------------------------------------------------------- Pulmonary artery:  The main pulmonary artery was normal-sized. Systolic pressure was within the normal range.  ------------------------------------------------------------------- Right atrium: The atrium was normal in size.  ------------------------------------------------------------------- Pericardium: There was no pericardial  effusion.  ------------------------------------------------------------------- Systemic veins: Inferior vena cava: The vessel was normal in size.  STS RISK SCORES Procedure: AV Replacement Risk of Mortality: 4.709% Morbidity or Mortality: 22.948% Long Length of Stay: 7.761% Short Length of Stay: 28.65% Permanent Stroke: 1.989% Prolonged Ventilation: 16.722% DSW Infection: 0.466% Renal Failure: 5.021% Reoperation: 9.804%  ASSESSMENT AND PLAN: 74 year-old gentleman with Stage D, Severe symptomatic aortic stenosis. Comorbid medical conditions include old MI, paroxysmal atrial fibrillation, and previous CABG. He has been treated medically but now exhibits symptoms of severe AS with progressive exercise intolerance and exertional dyspnea, NYHA Functional Class III. I have personally reviewed his echo images and his aortic stenosis has clearly progressed.   I have reviewed the natural history of aortic stenosis with the patient and his wife who are present today. We have discussed the limitations of medical therapy and the poor prognosis associated with symptomatic aortic stenosis. We have also reviewed potential treatment options, including palliative medical therapy, conventional surgical aortic valve replacement, and transcatheter aortic valve replacement. We discussed treatment options in the context of this patient's specific comorbid medical conditions.   I have recommended repeat right and left heart catheterization to evaluate graft patency and hemodynamics.  I have reviewed the risks, indications, and alternatives to cardiac catheterization with the patient. Risks include but are not limited to bleeding, infection, vascular injury, stroke, myocardial infection, arrhythmia, kidney injury, radiation-related injury in the case of prolonged fluoroscopy use, emergency cardiac surgery, and death. The patient understands the risks of serious complication is low (<8%).    Will then refer back to  Dr Roxy Manns who he has seen in the past to to further discuss treatment options (conventional surgery versus TAVR). I advised the patient to avoid any heavy physical exertion.  Current medicines are reviewed with the patient today.  The patient does not have concerns regarding medicines.  The following changes have been made:  no change  Signed, [name], MD  01/21/2015 10:46 AM    [address], [place], [place]  [address] Phone: [phone]; Fax: [phone]

## 2015-01-24 MED ORDER — ASPIRIN 81 MG PO CHEW: 81.0000 mg | CHEWABLE_TABLET | ORAL | Status: AC

## 2015-01-24 MED ORDER — SODIUM CHLORIDE 0.9 % IJ SOLN: 3.0000 mL | Freq: Two times a day (BID) | INTRAMUSCULAR | Status: DC

## 2015-01-24 MED ORDER — SODIUM CHLORIDE 0.9 % IV SOLN: 250.0000 mL | INTRAVENOUS | Status: DC | PRN

## 2015-01-24 MED ORDER — SODIUM CHLORIDE 0.9 % IJ SOLN: 3.0000 mL | INTRAMUSCULAR | Status: DC | PRN

## 2015-01-24 MED ORDER — SODIUM CHLORIDE 0.9 % IV SOLN
INTRAVENOUS | Status: DC
  Administered 2015-01-26: 08:00:00 via INTRAVENOUS

## 2015-01-26 ENCOUNTER — Ambulatory Visit (HOSPITAL_COMMUNITY)
Admission: RE | Admit: 2015-01-26 | Discharge: 2015-01-26 | Disposition: A | Discharge status: Home / Self Care | Payer: Medicare Other | Source: Ambulatory Visit | Attending: Cardiovascular Disease | Admitting: Cardiovascular Disease

## 2015-01-26 ENCOUNTER — Encounter (HOSPITAL_COMMUNITY)
Admission: RE | Disposition: A | Discharge status: Home / Self Care | Payer: Self-pay | Source: Ambulatory Visit | Attending: Cardiovascular Disease

## 2015-01-26 ENCOUNTER — Encounter (HOSPITAL_COMMUNITY): Payer: Self-pay | Admitting: Cardiovascular Disease

## 2015-01-26 DIAGNOSIS — I480 Paroxysmal atrial fibrillation: Secondary | ICD-10-CM | POA: Insufficient documentation

## 2015-01-26 DIAGNOSIS — I2582 Chronic total occlusion of coronary artery: Secondary | ICD-10-CM | POA: Insufficient documentation

## 2015-01-26 DIAGNOSIS — K219 Gastro-esophageal reflux disease without esophagitis: Secondary | ICD-10-CM | POA: Insufficient documentation

## 2015-01-26 DIAGNOSIS — Z79899 Other long term (current) drug therapy: Secondary | ICD-10-CM | POA: Insufficient documentation

## 2015-01-26 DIAGNOSIS — Z7982 Long term (current) use of aspirin: Secondary | ICD-10-CM | POA: Diagnosis not present

## 2015-01-26 DIAGNOSIS — I429 Cardiomyopathy, unspecified: Secondary | ICD-10-CM | POA: Insufficient documentation

## 2015-01-26 DIAGNOSIS — I10 Essential (primary) hypertension: Secondary | ICD-10-CM | POA: Diagnosis not present

## 2015-01-26 DIAGNOSIS — Z951 Presence of aortocoronary bypass graft: Secondary | ICD-10-CM | POA: Diagnosis not present

## 2015-01-26 DIAGNOSIS — I350 Nonrheumatic aortic (valve) stenosis: Secondary | ICD-10-CM | POA: Insufficient documentation

## 2015-01-26 DIAGNOSIS — I252 Old myocardial infarction: Secondary | ICD-10-CM | POA: Insufficient documentation

## 2015-01-26 DIAGNOSIS — Z7901 Long term (current) use of anticoagulants: Secondary | ICD-10-CM | POA: Insufficient documentation

## 2015-01-26 DIAGNOSIS — I2510 Atherosclerotic heart disease of native coronary artery without angina pectoris: Secondary | ICD-10-CM | POA: Insufficient documentation

## 2015-01-26 DIAGNOSIS — I2584 Coronary atherosclerosis due to calcified coronary lesion: Secondary | ICD-10-CM | POA: Insufficient documentation

## 2015-01-26 HISTORY — PX: LEFT AND RIGHT HEART CATHETERIZATION WITH CORONARY/GRAFT ANGIOGRAM: SHX5448

## 2015-01-26 LAB — PROTIME-INR
INR: 1.12 (ref 0.00–1.49)
PROTHROMBIN TIME: 14.6 s (ref 11.6–15.2)

## 2015-01-26 SURGERY — LEFT AND RIGHT HEART CATHETERIZATION WITH CORONARY/GRAFT ANGIOGRAM
Anesthesia: LOCAL

## 2015-01-26 MED ORDER — ACETAMINOPHEN 325 MG PO TABS: 650.0000 mg | ORAL_TABLET | ORAL | Status: DC | PRN

## 2015-01-26 MED ORDER — HEPARIN (PORCINE) IN NACL 2-0.9 UNIT/ML-% IJ SOLN
INTRAMUSCULAR | Status: AC
  Filled 2015-01-26: qty 1500

## 2015-01-26 MED ORDER — LIDOCAINE HCL (PF) 1 % IJ SOLN
INTRAMUSCULAR | Status: AC
  Filled 2015-01-26: qty 30

## 2015-01-26 MED ORDER — SODIUM CHLORIDE 0.9 % IV SOLN: 1.0000 mL/kg/h | INTRAVENOUS | Status: DC

## 2015-01-26 MED ORDER — VERAPAMIL HCL 2.5 MG/ML IV SOLN
INTRAVENOUS | Status: AC
  Filled 2015-01-26: qty 2

## 2015-01-26 MED ORDER — NITROGLYCERIN 1 MG/10 ML FOR IR/CATH LAB
INTRA_ARTERIAL | Status: AC
  Filled 2015-01-26: qty 10

## 2015-01-26 MED ORDER — FENTANYL CITRATE 0.05 MG/ML IJ SOLN
INTRAMUSCULAR | Status: AC
Start: 2015-01-26 — End: 2015-01-26
  Filled 2015-01-26: qty 2

## 2015-01-26 MED ORDER — MIDAZOLAM HCL 2 MG/2ML IJ SOLN
INTRAMUSCULAR | Status: AC
  Filled 2015-01-26: qty 2

## 2015-01-26 MED ORDER — HEPARIN SODIUM (PORCINE) 1000 UNIT/ML IJ SOLN
INTRAMUSCULAR | Status: AC
  Filled 2015-01-26: qty 1

## 2015-01-26 MED ORDER — ONDANSETRON HCL 4 MG/2ML IJ SOLN: 4.0000 mg | Freq: Four times a day (QID) | INTRAMUSCULAR | Status: DC | PRN

## 2015-01-26 NOTE — Progress Notes (Signed)
Site area: left brachial venous sheath  Site Prior to Removal:  Level 0  Pressure Applied For 10 MINUTES    Minutes Beginning at *1055 Manual:   Yes.    Patient Status During Pull:  stable  Post Pull Brachial Site:  Level 0  Post Pull Instructions Given:  Yes.    Post Pull Pulses Present:  Yes.    Dressing Applied:  Yes.     Bedrest Begins: 2010  Comments:  Pt tolerated left brachial venous sheath pull well. VSS

## 2015-01-26 NOTE — Discharge Instructions (Signed)
Radial Site Care °Refer to this sheet in the next few weeks. These instructions provide you with information on caring for yourself after your procedure. Your caregiver may also give you more specific instructions. Your treatment has been planned according to current medical practices, but problems sometimes occur. Call your caregiver if you have any problems or questions after your procedure. °HOME CARE INSTRUCTIONS °· You may shower the day after the procedure. Remove the bandage (dressing) and gently wash the site with plain soap and water. Gently pat the site dry. °· Do not apply powder or lotion to the site. °· Do not submerge the affected site in water for 3 to 5 days. °· Inspect the site at least twice daily. °· Do not flex or bend the affected arm for 24 hours. °· No lifting over 5 pounds (2.3 kg) for 5 days after your procedure. °· Do not drive home if you are discharged the same day of the procedure. Have someone else drive you. °· You may drive 24 hours after the procedure unless otherwise instructed by your caregiver. °· Do not operate machinery or power tools for 24 hours. °· A responsible adult should be with you for the first 24 hours after you arrive home. °What to expect: °· Any bruising will usually fade within 1 to 2 weeks. °· Blood that collects in the tissue (hematoma) may be painful to the touch. It should usually decrease in size and tenderness within 1 to 2 weeks. °SEEK IMMEDIATE MEDICAL CARE IF: °· You have unusual pain at the radial site. °· You have redness, warmth, swelling, or pain at the radial site. °· You have drainage (other than a small amount of blood on the dressing). °· You have chills. °· You have a fever or persistent symptoms for more than 72 hours. °· You have a fever and your symptoms suddenly get worse. °· Your arm becomes pale, cool, tingly, or numb. °· You have heavy bleeding from the site. Hold pressure on the site and call 911. °Document Released: 12/24/2010 Document  Revised: 02/13/2012 Document Reviewed: 12/24/2010 °[employer]® Patient Information ©2015 [employer]. This information is not intended to replace advice given to you by your health care provider. Make sure you discuss any questions you have with your health care provider. ° °

## 2015-01-26 NOTE — H&P (View-Only) (Signed)
Cardiology Office Note Date:  01/21/2015   ID:  Jesus [name], DOB 12-Sep-1941, MRN [id number]  PCP:  Jesus Post, MD  Cardiologist:  Jesus Mocha, MD    CC: Shortness of breath    History of Present Illness: Jesus [name] is a 74 y.o. male who presents for follow-up of severe aortic stenosis. He has a history of CAD s/p remote CABG with patent grafts when he underwent cardiac catheterization in 2013. Also has paroxysmal atrial fibrillation. He has been followed with serial echocardiograms and exercise treadmill testing in the setting of known severe aortic stenosis with minimal symptoms. He presented today for a treadmill study and 2D echo. However, when I reviewed his echo which demonstrated significant progression of what is now critical aortic stenosis, I cancelled his treadmill study and he presents for further discussion in the office. He is here with his wife today.  He now feels shortness of breath with walking up a hill. He also admits to increasing fatigue and feels more 'tired' in the evenings. He used to jog 25-30 minutes on the treadmill but over the past year he has slowed down to a 20 minute walk because of exercise intolerance. His wife also notes that he has 'slowed down' over the past year. He has lightheadedness, but denies chest pain or pressure. No syncope. No other complaints.  Past Medical History  Diagnosis Date  . Esophageal reflux   . Atrial fibrillation     holding sinus rhythm on Amiodarone  . Aortic stenosis     mild with a mean aortic valve gradient of 12 mmHg  . CAD (coronary artery disease) 1997    CABG  . Unspecified essential hypertension   . History of transesophageal echocardiography (TEE) for monitoring   . Cardiomyopathy   . Other and unspecified hyperlipidemia   . Skin lesions, generalized     facial which may represent actinic keratoses and possible photosensitivity from Amiodarone  . GERD (gastroesophageal reflux disease)   .  History of colonoscopy   . 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 Acute myocardial infarction of inferior wall 1980    Past Surgical History  Procedure Laterality Date  . Tonsillectomy    . Coronary artery bypass graft  02/09/1996    LIMA to diag-LAD, SVG to OM1-OM2-OM3, SVG to [facility]  . Cholecystectomy    . Cardioversion  11/18/2006    Jesus [name]  . Cataract extraction w/ intraocular lens implant  April '13  (Jesus [name])    left eye only    Current Outpatient Prescriptions  Medication Sig Dispense Refill  . amiodarone (PACERONE) 200 MG tablet Take 0.5 tablets (100 mg total) by mouth daily. 30 tablet 2  . amLODipine (NORVASC) 5 MG tablet Take 1 tablet (5 mg total) by mouth daily. 30 tablet 3  . Ascorbic Acid (VITAMIN C) 1000 MG tablet Take 1,000 mg by mouth daily.      [name] aspirin 81 MG tablet Take 81 mg by mouth daily.      [name] atorvastatin (LIPITOR) 80 MG tablet Take 0.5 tablets (40 mg total) by mouth daily. 45 tablet 5  . loratadine (CLARITIN) 10 MG tablet Take 10 mg by mouth daily as needed.      [name] losartan (COZAAR) 100 MG tablet Take 1 tablet (100 mg total) by mouth daily. 30 tablet 5  .  Multiple Vitamins-Minerals (MULTIVITAMIN,TX-MINERALS) tablet Take 1 tablet by mouth daily.      . pantoprazole (PROTONIX) 40 MG tablet Take 1 tablet (40 mg total) by mouth daily. 90 tablet 2  . triamcinolone cream (KENALOG) 0.5 % Apply topically 3 (three) times daily. 60 g 1  . warfarin (COUMADIN) 5 MG tablet Take as directed by coumadin clinic. 30 tablet 3   No current facility-administered medications for this visit.    Allergies:   Codeine and Iodine   Social History:  The patient  reports that he has never smoked. He has never used smokeless tobacco. He reports that he does not drink alcohol or use illicit drugs.   Family History:  The patient's family history  includes Atrial fibrillation in his brother; Crohn's disease in his mother; Heart attack (age of onset: 32) in his father; Heart disease in his father and paternal uncle; Heart failure in his father; Prostate cancer in his paternal uncle; Stroke (age of onset: 44) in his mother.    ROS:  Please see the history of present illness.  Otherwise, review of systems is positive for constipation, exertional dyspnea, fatigue, and bilateral leg pain.  All other systems are reviewed and negative.    PHYSICAL EXAM: VS:  BP 142/68, HR 53, O2 98, Weight 60 kg, Ht 1.58 m GEN: Well nourished, well developed, in no acute distress HEENT: normal Neck: no JVD, no masses. Carotid upstrokes are delayed and diminished. Cardiac: bradycardic with 4/6 harsh late-peaking systolic murmur at the LSB            Respiratory:  clear to auscultation bilaterally, normal work of breathing GI: soft, nontender, nondistended, + BS MS: no deformity or atrophy Ext: no pretibial edema Skin: warm and dry, no rash Neuro:  Strength and sensation are intact Psych: euthymic mood, full affect  EKG:  EKG is ordered today. The ekg ordered today shows Sinus Bradycardia with age-indeterminate inferolateral MI, no change from previous tracings  Recent Labs: 08/27/2014: ALT 22; BUN 16; Creatinine 0.9; Hemoglobin 11.6*; Platelets 134.0*; Potassium 3.7; Sodium 137; TSH 2.61   Lipid Panel     Component Value Date/Time   CHOL 128 08/27/2014 1335   TRIG 121.0 08/27/2014 1335   TRIG 67 11/20/2006 0748   HDL 37.40* 08/27/2014 1335   CHOLHDL 3 08/27/2014 1335   CHOLHDL 3.2 CALC 11/20/2006 0748   VLDL 24.2 08/27/2014 1335   LDLCALC 66 08/27/2014 1335      Wt Readings from Last 3 Encounters:  09/03/14 136 lb (61.689 kg)  07/11/14 134 lb (60.782 kg)  04/07/14 138 lb (62.596 kg)     Cardiac Studies Reviewed: Cardiac Catheterization 08/02/2012: Procedural Findings: Hemodynamics RA 3 RV 21/5 PA 23/6 with a mean of 12 PCWP A wave 7  V wave 5 mean of 5 LV 137/16 AO 100/50 with a mean of 71  Oxygen saturations: PA 70 AO 93  Cardiac Output (Fick) 5.2 Cardiac Index (Fick) 3.2  Aortic valve hemodynamics: Mean gradient 44, valve area 0.94 square centimeters  Coronary angiography: Coronary dominance: right  Left mainstem: The left mainstem is densely calcified. There is 90% distal left main stenosis the  Left anterior descending (LAD): The LAD is densely calcified. The vessel is totally occluded at the first septal perforator.  Left circumflex (LCx): The left circumflex is densely calcified. The vessel is totally occluded prior to the first obtuse marginal.  Right coronary artery (RCA): The right coronary artery is heavily calcified. The vessel is totally occluded at  its origin.  Saphenous vein graft sequence to acute marginals 1 and 2 in the right PDA is patent. There are some irregularities but no significant stenoses throughout the body of the vein graft. The acute marginal branches are patent. The PDA is patent. The posterior AV segment is severely diseased but several posterolateral branches fill from this graft to  Saphenous vein graft sequence to OM1, OM 2, and ON 3 is patent. The body of the graft has no significant disease. The proximal OM branches are tiny. The major branches the OM 3 which is patent. There is a high-grade 90% stenosis in the midportion of that obtuse marginal target vessel. Beyond the area of severe stenosis the vessel divides into twin branches.  LIMA to LAD: This graft was imaged subselectively from the left subclavian because of severe subclavian tortuosity. The LIMA is widely patent. The LAD and diagonal branches remain patent and the anastomotic site appears widely patent.  Left ventriculography: Left ventricular function is severely depressed. There is dense akinesis of the basal and midinferior walls. There is hypokinesis of anterolateral wall and apex. The left ventricular ejection  fraction is estimated at 35%. There is no significant mitral regurgitation.  Aortic valve: The aortic valve is severely calcified. There is restricted opening of the aortic valve leaflets which is well seen on plain fluoroscopy.  Final Conclusions:  1. Severe native three-vessel coronary artery disease with total occlusion of all major epicardial coronary vessels 2. Status [name] aortocoronary bypass surgery with continued patency of all grafts. Diffuse small vessel disease as described, primarily in the posterolateral branches and third obtuse marginal branch  3. Severe calcific aortic stenosis with a mean gradient of 44 mm mercury and aortic valve area of 0.9 4. Severe segmental left ventricular dysfunction with an estimated LVEF of 35%  Recommendations: Mr. Whittley has severe aortic stenosis with at least moderately severe LV dysfunction. Despite his low ejection fraction, his cardiac output is well maintained based on Fick measurements. He is minimally symptomatic. The major question is whether to proceed with aortic valve replacement at this time considering his LV dysfunction and slowly progressive severe aortic stenosis. I am going to refer him for a cardiac surgery evaluation.  2D Echo: Left ventricle: The cavity size was normal. Systolic function was mildly reduced. The estimated ejection fraction was in the range of 45% to 50%. Regional wall motion abnormalities:  There is akinesis of the midinferior myocardium. Doppler parameters are consistent with elevated ventricular end-diastolic filling pressure.  ------------------------------------------------------------------- Aortic valve:  Trileaflet. Severe thickening and calcification. Valve mobility was severely restricted and the right coronary cusp appears fixed. Doppler:  There was critical stenosis.  There was trivial regurgitation.  VTI ratio of LVOT to aortic valve: 0.12. Valve area (VTI): 0.38 cm^2. Indexed valve  area (VTI): 0.23 cm^2/m^2. Valve area (Vmax): 0.4 cm^2. Indexed valve area (Vmax): 0.24 cm^2/m^2. Mean velocity ratio of LVOT to aortic valve: 0.12. Valve area (Vmean): 0.39 cm^2. Indexed valve area (Vmean): 0.24 cm^2/m^2.  Mean gradient (S): 57 mm Hg. Peak gradient (S): 104 mm Hg.  ------------------------------------------------------------------- Aorta: Aortic root: The aortic root was normal in size.  ------------------------------------------------------------------- Mitral valve:  Mildly thickened leaflets . Mobility was not restricted. Doppler: Transvalvular velocity was within the normal range. There was no evidence for stenosis. There was mild regurgitation.  Peak gradient (D): 4 mm Hg.  ------------------------------------------------------------------- Left atrium: The atrium was severely dilated.  ------------------------------------------------------------------- Right ventricle: The cavity size was normal. Wall thickness was normal. Systolic function  was normal.  ------------------------------------------------------------------- Pulmonic valve:  Structurally normal valve.  Cusp separation was normal. Doppler: Transvalvular velocity was within the normal range. There was no evidence for stenosis. There was no regurgitation.  ------------------------------------------------------------------- Tricuspid valve:  Structurally normal valve.  Doppler: Transvalvular velocity was within the normal range. There was mild regurgitation.  ------------------------------------------------------------------- Pulmonary artery:  The main pulmonary artery was normal-sized. Systolic pressure was within the normal range.  ------------------------------------------------------------------- Right atrium: The atrium was normal in size.  ------------------------------------------------------------------- Pericardium: There was no pericardial  effusion.  ------------------------------------------------------------------- Systemic veins: Inferior vena cava: The vessel was normal in size.  STS RISK SCORES Procedure: AV Replacement Risk of Mortality: 4.709% Morbidity or Mortality: 22.948% Long Length of Stay: 7.761% Short Length of Stay: 28.65% Permanent Stroke: 1.989% Prolonged Ventilation: 16.722% DSW Infection: 0.466% Renal Failure: 5.021% Reoperation: 9.804%  ASSESSMENT AND PLAN: 74 year-old gentleman with Stage D, Severe symptomatic aortic stenosis. Comorbid medical conditions include old MI, paroxysmal atrial fibrillation, and previous CABG. He has been treated medically but now exhibits symptoms of severe AS with progressive exercise intolerance and exertional dyspnea, NYHA Functional Class III. I have personally reviewed his echo images and his aortic stenosis has clearly progressed.   I have reviewed the natural history of aortic stenosis with the patient and his wife who are present today. We have discussed the limitations of medical therapy and the poor prognosis associated with symptomatic aortic stenosis. We have also reviewed potential treatment options, including palliative medical therapy, conventional surgical aortic valve replacement, and transcatheter aortic valve replacement. We discussed treatment options in the context of this patient's specific comorbid medical conditions.   I have recommended repeat right and left heart catheterization to evaluate graft patency and hemodynamics.  I have reviewed the risks, indications, and alternatives to cardiac catheterization with the patient. Risks include but are not limited to bleeding, infection, vascular injury, stroke, myocardial infection, arrhythmia, kidney injury, radiation-related injury in the case of prolonged fluoroscopy use, emergency cardiac surgery, and death. The patient understands the risks of serious complication is low (<0%).    Will then refer back to  Jesus Roxy Manns who he has seen in the past to to further discuss treatment options (conventional surgery versus TAVR). I advised the patient to avoid any heavy physical exertion.  Current medicines are reviewed with the patient today.  The patient does not have concerns regarding medicines.  The following changes have been made:  no change  Signed, Jesus Mocha, MD  01/21/2015 10:46 AM    [address] [address], [place], [place]  [address] Phone: [phone]; Fax: [phone]

## 2015-01-26 NOTE — CV Procedure (Signed)
Cardiac Catheterization Procedure Note  Name: Jesus [name] MRN: [id number] DOB: June 04, 1941  Procedure: Right Heart Cath, catheter placement for angiography, Selective Coronary Angiography, LIMA angiography, saphenous vein graft angiography, aortic root angiography  Indication: Severe symptomatic aortic stenosis. This 74 year old gentleman is status post remote multivessel CABG. He has developed progressive and now critical aortic stenosis. He presents today for preoperative cardiac catheterization  Procedural Details: There was an indwelling IV in a left antecubital vein. Using normal sterile technique, the IV was changed out for a 5 Fr brachial sheath over a 0.018 inch wire. The left wrist was then prepped, draped, and anesthetized with 1% lidocaine. Using the modified Seldinger technique a 5/6 French Slender sheath was placed in the left radial artery. Intra-arterial verapamil was administered through the radial artery sheath. IV heparin was administered after a JR4 catheter was advanced into the central aorta. A Swan-Ganz catheter was used for the right heart catheterization. Standard protocol was followed for recording of right heart pressures and sampling of oxygen saturations. Fick cardiac output was calculated. Standard Judkins catheters were used for selective coronary angiography. A JR4 catheter was used for LIMA angiography. An AL 2 catheter was used for vein graft angiography. A pigtail catheter was used at the completion of the procedure to perform aortic root angiography. There were no immediate procedural complications. The patient was transferred to the post catheterization recovery area for further monitoring.  Procedural Findings: Hemodynamics RA 3 RV 29 over 4 PA 29/6 with a mean of 14 PCWP A wave 9, V wave 8, mean of 6 LV not recorded AO 108/49 with a mean of 71  Oxygen saturations: PA 69 AO 93  Cardiac Output (Fick) 4.95  Cardiac Index (Fick) 3.1   Coronary  angiography: Coronary dominance: right  Left mainstem: Severely calcified with 99% stenosis and TIMI 2 flow  Left anterior descending (LAD): 100% proximal occlusion with severe calcification  Left circumflex (LCx): 100% occlusion with severe calcification  Right coronary artery (RCA): 100% occlusion with moderate proximal calcification  LIMA to LAD: The graft is widely patent. The distal anastomosis occurs at the LAD/diagonal bifurcation and both native vessels fill briskly.  Saphenous vein graft sequential to OM1, OM 2, and OM 3: Patent graft with diffuse irregularities. The first and second OM branches are small in caliber, but patent. The third OM is large in caliber. The AV circumflex is severely calcified and it fills retrograde from the graft. The distal portion of the third OM graft has an eccentric 80% stenosis.  Saphenous vein graft sequential to the acute marginals and PDA is patent. The graft is fairly smooth throughout. The PDA anastomosis is widely patent. The acute marginals are patent and small in caliber. There is a septal cascade of the LAD that fills from collaterals from the PDA through septal branches.  Aortic root angiography: The aortic valve is severely thickened, calcified, and restricted. There is trivial aortic insufficiency. Visualized portions of the proximal ascending aorta appear to be normal in caliber.  Left ventriculography: Deferred (aortic valve not crossed)  Estimated Blood Loss: minimal  Final Conclusions:   1. Severe native three-vessel coronary artery disease with heavily calcified and totally occluded coronary arteries 2. Status post aortocoronary bypass surgery with continued patency of the LIMA to LAD, saphenous vein graft sequential to OM branches, and saphenous vein graft sequential to RCA branches. 3. Severely calcified aortic valve with known critical aortic stenosis 4. Well-preserved intra-coronary hemodynamics as measured at rest 5. Severe  stenosis of the distal OM 3 branch after the insertion of the saphenous vein graft.  Recommendations: Further evaluation by Dr Roxy Manns for aortic valve replacement (redo conventional AVR versus TAVR).  [name] MD, [facility] 01/26/2015, 10:23 AM

## 2015-01-26 NOTE — Interval H&P Note (Signed)
History and Physical Interval Note:  01/26/2015 9:24 AM  Jesus [name]  has presented today for surgery, with the diagnosis of critical AS  The various methods of treatment have been discussed with the patient and family. After consideration of risks, benefits and other options for treatment, the patient has consented to  Procedure(s): LEFT AND RIGHT HEART CATHETERIZATION WITH CORONARY/GRAFT ANGIOGRAM (N/A) as a surgical intervention .  The patient's history has been reviewed, patient examined, no change in status, stable for surgery.  I have reviewed the patient's chart and labs.  Questions were answered to the patient's satisfaction.     [name]

## 2015-01-27 ENCOUNTER — Telehealth: Payer: Self-pay | Admitting: Cardiovascular Disease

## 2015-01-27 LAB — POCT I-STAT 3, VENOUS BLOOD GAS (G3P V)
Acid-base deficit: 1 mmol/L (ref 0.0–2.0)
BICARBONATE: 24.4 meq/L — AB (ref 20.0–24.0)
O2 Saturation: 69 %
PCO2 VEN: 44.5 mmHg — AB (ref 45.0–50.0)
PH VEN: 7.347 — AB (ref 7.250–7.300)
PO2 VEN: 38 mmHg (ref 30.0–45.0)
TCO2: 26 mmol/L (ref 0–100)

## 2015-01-27 LAB — POCT I-STAT 3, ART BLOOD GAS (G3+)
Acid-base deficit: 2 mmol/L (ref 0.0–2.0)
Bicarbonate: 23.4 mEq/L (ref 20.0–24.0)
O2 Saturation: 93 %
PCO2 ART: 42.1 mmHg (ref 35.0–45.0)
PH ART: 7.354 (ref 7.350–7.450)
TCO2: 25 mmol/L (ref 0–100)
pO2, Arterial: 70 mmHg — ABNORMAL LOW (ref 80.0–100.0)

## 2015-01-27 LAB — POCT ACTIVATED CLOTTING TIME: Activated Clotting Time: 165 seconds

## 2015-01-27 NOTE — Telephone Encounter (Signed)
New message      Pt had a cath yesterday.  When can he restart warfarin?

## 2015-01-27 NOTE — Telephone Encounter (Signed)
Per [name] the pt can restart warfarin today. Pt's wife aware.

## 2015-01-30 ENCOUNTER — Ambulatory Visit: Payer: Medicare Other | Admitting: Cardiovascular Disease

## 2015-02-02 ENCOUNTER — Encounter: Payer: Medicare Other | Admitting: Thoracic Surgery (Cardiothoracic Vascular Surgery)

## 2015-02-03 ENCOUNTER — Ambulatory Visit (INDEPENDENT_AMBULATORY_CARE_PROVIDER_SITE_OTHER): Payer: Medicare Other | Admitting: General Practice

## 2015-02-03 DIAGNOSIS — Z5181 Encounter for therapeutic drug level monitoring: Secondary | ICD-10-CM

## 2015-02-03 LAB — POCT INR: INR: 1.3

## 2015-02-03 NOTE — Progress Notes (Signed)
Agree with plan 

## 2015-02-03 NOTE — Progress Notes (Signed)
Pre visit review using our clinic review tool, if applicable. No additional management support is needed unless otherwise documented below in the visit note. 

## 2015-02-06 ENCOUNTER — Institutional Professional Consult (permissible substitution) (INDEPENDENT_AMBULATORY_CARE_PROVIDER_SITE_OTHER): Payer: Medicare Other | Admitting: Thoracic Surgery (Cardiothoracic Vascular Surgery)

## 2015-02-06 ENCOUNTER — Encounter: Payer: Self-pay | Admitting: Thoracic Surgery (Cardiothoracic Vascular Surgery)

## 2015-02-06 VITALS — BP 134/68 | HR 59 | Resp 16 | Ht 62.0 in | Wt 131.0 lb

## 2015-02-06 DIAGNOSIS — Z951 Presence of aortocoronary bypass graft: Secondary | ICD-10-CM | POA: Diagnosis not present

## 2015-02-06 DIAGNOSIS — I350 Nonrheumatic aortic (valve) stenosis: Secondary | ICD-10-CM

## 2015-02-06 DIAGNOSIS — I4891 Unspecified atrial fibrillation: Secondary | ICD-10-CM | POA: Diagnosis not present

## 2015-02-06 DIAGNOSIS — I2510 Atherosclerotic heart disease of native coronary artery without angina pectoris: Secondary | ICD-10-CM | POA: Diagnosis not present

## 2015-02-06 DIAGNOSIS — I359 Nonrheumatic aortic valve disorder, unspecified: Secondary | ICD-10-CM

## 2015-02-06 DIAGNOSIS — I25810 Atherosclerosis of coronary artery bypass graft(s) without angina pectoris: Secondary | ICD-10-CM | POA: Diagnosis not present

## 2015-02-06 DIAGNOSIS — Z8679 Personal history of other diseases of the circulatory system: Secondary | ICD-10-CM

## 2015-02-06 NOTE — Patient Instructions (Signed)
Schedule CT scans, pulmonary function tests and physical therapy evaluation

## 2015-02-06 NOTE — Progress Notes (Signed)
[name]    CARDIOTHORACIC SURGERY CONSULTATION REPORT  Referring Provider is [name], MD PCP is [name], MD  Chief Complaint  Patient presents with  . Shortness of Breath  . Fatigue  . Aortic Stenosis    HPI:  Patient is a 74 year old male with long-standing history of coronary artery disease with ischemic cardiomyopathy s/p CABG in the remote past and aortic stenosis who has been referred for follow-up consultation to discuss treatment options for management of severe symptomatic aortic stenosis.  The patient's cardiac history dates back to 1980 when he sustained a large inferior wall myocardial infarction. He was treated medically at that time and followed for many years by Dr. [name].  He later underwent coronary artery bypass grafting x8 by Dr. [name] in 1997. Grafts placed at the time of surgery included left internal mammary artery sewn sequentially to the diagonal branch and to left anterior descending coronary artery, saphenous vein graft sewn sequentially to the OM1, OM2, and OM3 branches of the left circumflex coronary artery, and saphenous vein graft sewn sequentially to the AM1, AM2, and posterior descending coronary artery of the right coronary circulation. The patient did well for many years, although he subsequently developed aortic stenosis which slowly progressed on follow-up echocardiograms. For the last several years he has been followed by Dr. [name], and he was referred for surgical consultation in the fall of 2013 when echocardiogram demonstrated that the patient's aortic stenosis had become severe with mean transvalvular gradient estimated 44 mmHg, and valve area calculated 0.9 cm.  At that time the patient remained asymptomatic, and a decision was made to follow him carefully on medical therapy.  Over the past year the patient has developed gradual progression of exertional shortness of breath  and fatigue. He was seen in follow-up recently by Dr. [name], and repeat transthoracic echocardiogram revealed further progression in the severity of aortic stenosis with peak velocity across aortic valve measured 4.8 m/sec corresponding to estimated peak and mean transvalvular gradients of 104 and 57 mmHg, respectively. Left ventricular systolic function remains stable with ejection fraction estimated 45-50%.  The patient subsequently underwent left and right heart catheterization demonstrating severe coronary artery disease with 100% chronic occlusion of the native coronary vessels but continued patency of the majority of bypass grafts placed at the time of surgery in 1997. There was significant stenosis of the third obtuse marginal branch of left circumflex coronary artery beyond the insertion of the vein graft.  Pulmonary artery pressures and cardiac output were normal. The patient has been referred for follow-up surgical consultation.  The patient is married and lives with his wife in [name], [name].  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 He has remained physically active and functionally independent for all of his life.  Over the past year he has developed progressive symptoms of exertional shortness of breath and fatigue. The symptoms have progressed to the point where they now limiting his daily activities somewhat.  He has been forced to stop and take breaks on a regular basis at work.  Recently he has developed some mild tightness across his chest associated with exertion. He denies any symptoms occurring at rest.  He denies any PND, orthopnea, or lower extremity edema.    Past Medical History  Diagnosis Date  . Esophageal reflux   . Atrial fibrillation     holding  sinus rhythm on Amiodarone  . Aortic stenosis     mild with a mean aortic valve gradient of 12 mmHg  . CAD (coronary artery disease) 1997    CABG   . Unspecified essential hypertension   . History of transesophageal echocardiography (TEE) for monitoring   . Cardiomyopathy   . Other and unspecified hyperlipidemia   . Skin lesions, generalized     facial which may represent actinic keratoses and possible photosensitivity from Amiodarone  . GERD (gastroesophageal reflux disease)   . History of colonoscopy   . S/P CABG (coronary artery bypass graft) 02/09/1996    LIMA to diag-LAD, SVG to OM1-OM2-OM3, SVG to [name], open SVG harvest from right leg - Dr [name]  . Acute myocardial infarction of inferior wall 1980    Past Surgical History  Procedure Laterality Date  . Tonsillectomy    . Coronary artery bypass graft  02/09/1996    LIMA to diag-LAD, SVG to OM1-OM2-OM3, SVG to [name]  . Cholecystectomy    . Cardioversion  11/18/2006    Dr. [name]  . Cataract extraction w/ intraocular lens implant  April '13  (Dr. [name])    left eye only  . Left and right heart catheterization with coronary/graft angiogram N/A 01/26/2015    Procedure: LEFT AND RIGHT HEART CATHETERIZATION WITH [name];  Surgeon: [name], MD;  Location: [name];  Service: Cardiovascular;  Laterality: N/A;    Family History  Problem Relation Age of Onset  . Stroke Mother 78    Deceased  . Heart attack Father 5    Deceased  . Atrial fibrillation Brother   . Crohn's disease Mother     Deceased  . Heart disease Father   . Heart disease Paternal Uncle   . Prostate cancer Paternal Uncle   . Heart failure Father     Deceased    History   Social History  . Marital Status: Married    Spouse Name: N/A  . Number of Children: N/A  . Years of Education: N/A   Occupational History  . Not on file.   Social History Main Topics  . Smoking status: Never Smoker   . Smokeless tobacco: Never Used     Comment: Does not smoke.  [name] Alcohol Use: No  . Drug Use: No  . Sexual Activity: Not Currently   Other Topics Concern  . Not  on file   Social History Narrative   HSG. [employer] 6 years. Married - '69 - 1 year/divorced. '76 - . No children. Work - mfg/textiles - Designer, multimedia; currently works doing maintenance. [employer] - they have discussed this. Provided packet august '13.                 Current Outpatient Prescriptions  Medication Sig Dispense Refill  . amiodarone (PACERONE) 200 MG tablet Take 0.5 tablets (100 mg total) by mouth daily. 30 tablet 2  . amLODipine (NORVASC) 5 MG tablet Take 1 tablet (5 mg total) by mouth daily. 30 tablet 3  . Ascorbic Acid (VITAMIN C) 1000 MG tablet Take 1,000 mg by mouth daily.      [name] aspirin 81 MG tablet Take 81 mg by mouth daily.      [name] atorvastatin (LIPITOR) 80 MG tablet Take 0.5 tablets (40 mg total) by mouth daily. 45 tablet 5  . loratadine (CLARITIN) 10 MG tablet Take 10 mg by mouth daily as needed.      [name] losartan (COZAAR) 100 MG  tablet Take 1 tablet (100 mg total) by mouth daily. 30 tablet 5  . Multiple Vitamins-Minerals (MULTIVITAMIN,TX-MINERALS) tablet Take 1 tablet by mouth daily.      . pantoprazole (PROTONIX) 40 MG tablet Take 1 tablet (40 mg total) by mouth daily. 90 tablet 2  . triamcinolone cream (KENALOG) 0.5 % Apply topically 3 (three) times daily. 60 g 1  . warfarin (COUMADIN) 5 MG tablet Take as directed by coumadin clinic. 30 tablet 3   No current facility-administered medications for this visit.    Allergies  Allergen Reactions  . Codeine Other (See Comments)    Pt does not remember  . Iodine Swelling      Review of Systems:   General:  normal appetite, decreased energy, no weight gain, no weight loss, no fever  Cardiac:  + chest pain with exertion, no chest pain at rest, + SOB with exertion, no resting SOB, no PND, no orthopnea, no palpitations, + arrhythmia, + atrial fibrillation, no LE edema, no dizzy spells, no syncope  Respiratory:  + exertional shortness of breath, no home oxygen, no productive cough, + recent dry cough, no  bronchitis, no wheezing, no hemoptysis, no asthma, no pain with inspiration or cough, + sleep apnea, no CPAP at night  GI:   occasional difficulty swallowing, + reflux, no frequent heartburn, no hiatal hernia, no abdominal pain, no constipation, no diarrhea, no hematochezia, no hematemesis, no melena  GU:   no dysuria,  no frequency, no urinary tract infection, no hematuria, no enlarged prostate, no kidney stones, no kidney disease  Vascular:  no pain suggestive of claudication, no pain in feet, no leg cramps, no varicose veins, no DVT, no non-healing foot ulcer  Neuro:   no stroke, no TIA's, no seizures, no headaches, no temporary blindness one eye,  no slurred speech, no peripheral neuropathy, no chronic pain, no instability of gait, no memory/cognitive dysfunction  Musculoskeletal: + arthritis, no joint swelling, no myalgias, no difficulty walking, normal mobility   Skin:   no rash, no itching, no skin infections, no pressure sores or ulcerations  Psych:   no anxiety, no depression, no nervousness, no unusual recent stress  Eyes:   no blurry vision, no floaters, no recent vision changes, + wears glasses or contacts  ENT:   + hearing loss, no loose or painful teeth, no dentures, last saw dentist December 2015  Hematologic:  + easy bruising, no abnormal bleeding, no clotting disorder, no frequent epistaxis  Endocrine:  no diabetes, does not check CBG's at home           Physical Exam:   BP 134/68 mmHg  Pulse 59  Resp 16  Ht 5\' 2"  (1.575 m)  Wt 131 lb (59.421 kg)  BMI 23.95 kg/m2  SpO2 97%  General:  Thin,  well-appearing  HEENT:  Unremarkable   Neck:   no JVD, no bruits, no adenopathy   Chest:   clear to auscultation, symmetrical breath sounds, no wheezes, no rhonchi   CV:   RRR, grade IV/VI late peaking crescendo/decrescendo murmur heard best at RUSB,  no diastolic murmur  Abdomen:  soft, non-tender, no masses   Extremities:  warm, well-perfused, pulses palpable, no LE  edema  Rectal/GU  Deferred  Neuro:   Grossly non-focal and symmetrical throughout  Skin:   Clean and dry, no rashes, no breakdown   Diagnostic Tests:  Transthoracic Echocardiography  Patient:  Jesus, [name] MR #:    [id number] Study Date: 01/21/2015 Gender:  M Age:    85 Height:   157.5 cm Weight:   60.8 kg BSA:    1.64 m^2 Pt. Status: Room:  ATTENDING  Fransico Him, MD SONOGRAPHER Oletta Lamas, Will ORDERING   [place], Outpatient  cc:  ------------------------------------------------------------------- LV EF: 45% -  50%  ------------------------------------------------------------------- Indications:   (I35.0).  ------------------------------------------------------------------- History:  PMH: Paroxysmal atrial fibrillation. Acquired from the patient and from the patient&'s chart. Coronary artery disease. Aortic stenosis. Aortic valve disease. PMH:  Myocardial infarction.  ------------------------------------------------------------------- Study Conclusions  - Left ventricle: The cavity size was normal. Systolic function was mildly reduced. The estimated ejection fraction was in the range of 45% to 50%. There is akinesis of the midinferior myocardium. Doppler parameters are consistent with elevated ventricular end-diastolic filling pressure. - Aortic valve: Severe thickening and calcification. Valve mobility was severely restricted and the right coronary cusp appears fixed. There was critical stenosis. There was trivial regurgitation. - Mitral valve: There was mild regurgitation. - Left atrium: The atrium was severely dilated. - Tricuspid valve: There was mild regurgitation.  ------------------------------------------------------------------- Labs, prior tests, procedures, and surgery: Coronary artery bypass grafting.  Transthoracic echocardiography.  M-mode, complete 2D, spectral Doppler, and color Doppler. Birthdate: Patient birthdate: 1941/09/04. Age: Patient is 74 yr old. Sex: Gender: male. BMI: 24.5 kg/m^2. Blood pressure:   143/70 Patient status: Outpatient. Study date: Study date: 01/21/2015. Study time: 08:31 AM. Location: [facility]  -------------------------------------------------------------------  ------------------------------------------------------------------- Left ventricle: The cavity size was normal. Systolic function was mildly reduced. The estimated ejection fraction was in the range of 45% to 50%. Regional wall motion abnormalities:  There is akinesis of the midinferior myocardium. Doppler parameters are consistent with elevated ventricular end-diastolic filling pressure.  ------------------------------------------------------------------- Aortic valve:  Trileaflet. Severe thickening and calcification. Valve mobility was severely restricted and the right coronary cusp appears fixed. Doppler:  There was critical stenosis.  There was trivial regurgitation.  VTI ratio of LVOT to aortic valve: 0.12. Valve area (VTI): 0.38 cm^2. Indexed valve area (VTI): 0.23 cm^2/m^2. Valve area (Vmax): 0.4 cm^2. Indexed valve area (Vmax): 0.24 cm^2/m^2. Mean velocity ratio of LVOT to aortic valve: 0.12. Valve area (Vmean): 0.39 cm^2. Indexed valve area (Vmean): 0.24 cm^2/m^2.  Mean gradient (S): 57 mm Hg. Peak gradient (S): 104 mm Hg.  ------------------------------------------------------------------- Aorta: Aortic root: The aortic root was normal in size.  ------------------------------------------------------------------- Mitral valve:  Mildly thickened leaflets . Mobility was not restricted. Doppler: Transvalvular velocity was within the normal range. There was no evidence for stenosis. There was mild regurgitation.  Peak gradient (D): 4 mm  Hg.  ------------------------------------------------------------------- Left atrium: The atrium was severely dilated.  ------------------------------------------------------------------- Right ventricle: The cavity size was normal. Wall thickness was normal. Systolic function was normal.  ------------------------------------------------------------------- Pulmonic valve:  Structurally normal valve.  Cusp separation was normal. Doppler: Transvalvular velocity was within the normal range. There was no evidence for stenosis. There was no regurgitation.  ------------------------------------------------------------------- Tricuspid valve:  Structurally normal valve.  Doppler: Transvalvular velocity was within the normal range. There was mild regurgitation.  ------------------------------------------------------------------- Pulmonary artery:  The main pulmonary artery was normal-sized. Systolic pressure was within the normal range.  ------------------------------------------------------------------- Right atrium: The atrium was normal in size.  ------------------------------------------------------------------- Pericardium: There was no pericardial effusion.  ------------------------------------------------------------------- Systemic veins: Inferior vena cava: The vessel was normal in size.  ------------------------------------------------------------------- Measurements  Left ventricle              Value     Reference  LV ID, ED, PLAX chordal      (H)   55.3 mm    43 - 52 LV ID, ES, PLAX chordal      (H)   44.3 mm    23 - 38 LV fx shortening, PLAX chordal  (L)   20  %    >=29 LV PW thickness, ED            8.77 mm    --------- IVS/LV PW ratio, ED            1.28      <=1.3 Stroke volume, 2D             51  ml    --------- Stroke volume/bsa, 2D            31  ml/m^2  --------- LV ejection fraction, 1-p A4C       44  %    --------- LV end-diastolic volume, 2-p       147  ml    --------- LV end-systolic volume, 2-p        82  ml    --------- LV ejection fraction, 2-p         44  %    --------- Stroke volume, 2-p            65  ml    --------- LV end-diastolic volume/bsa, 2-p     90  ml/m^2  --------- LV end-systolic volume/bsa, 2-p      50  ml/m^2  --------- Stroke volume/bsa, 2-p          39.6 ml/m^2  --------- LV e&', lateral              7.21 cm/s   --------- LV E/e&', lateral             13.41     --------- LV e&', medial               4.78 cm/s   --------- LV E/e&', medial              20.23     --------- LV e&', average              6   cm/s   --------- LV E/e&', average             16.13     ---------  Ventricular septum            Value     Reference IVS thickness, ED             11.2 mm    ---------  LVOT                   Value     Reference LVOT ID, S                20  mm    --------- LVOT area                 3.14 cm^2   --------- LVOT ID                  20  mm    --------- LVOT peak velocity, S           64.7 cm/s   --------- LVOT mean velocity, S           43.5 cm/s   --------- LVOT VTI, S  16.1 cm    --------- LVOT peak gradient, S           2   mm Hg  --------- Stroke volume (SV), LVOT DP        50.6 ml    --------- Stroke index (SV/bsa), LVOT DP      30.8 ml/m^2  ---------  Aortic valve               Value     Reference Aortic valve mean velocity, S        351  cm/s   --------- Aortic valve VTI, S            132  cm    --------- Aortic mean gradient, S          57  mm Hg  --------- Aortic peak gradient, S          104  mm Hg  --------- VTI ratio, LVOT/AV            0.12      --------- Aortic valve area, VTI          0.38 cm^2   --------- Aortic valve area/bsa, VTI        0.23 cm^2/m^2 --------- Aortic valve area, peak velocity     0.4  cm^2   --------- Aortic valve area/bsa, peak        0.24 cm^2/m^2 --------- velocity Velocity ratio, mean, LVOT/AV       0.12      --------- Aortic valve area, mean velocity     0.39 cm^2   --------- Aortic valve area/bsa, mean        0.24 cm^2/m^2 --------- velocity Aortic regurg pressure half-time     464  ms    ---------  Aorta                   Value     Reference Aortic root ID, ED            34  mm    --------- Ascending aorta ID, A-P, S        33  mm    ---------  Left atrium                Value     Reference LA ID, A-P, ES              46  mm    --------- LA ID/bsa, A-P          (H)   2.8  cm/m^2  <=2.2 LA volume, S               95  ml    --------- LA volume/bsa, S             57.9 ml/m^2  --------- LA volume, ES, 1-p A4C          102  ml    --------- LA volume/bsa, ES, 1-p A4C        62.1 ml/m^2  --------- LA volume, ES, 1-p A2C          86  ml    --------- LA volume/bsa, ES, 1-p A2C        52.4 ml/m^2  ---------  Mitral valve               Value     Reference Mitral E-wave peak velocity        96.7 cm/s   --------- Mitral A-wave peak velocity  87.4 cm/s   --------- Mitral deceleration time      (H)   408  ms    150 - 230 Mitral peak gradient, D          4   mm Hg  --------- Mitral E/A ratio, peak          1.1      ---------  Pulmonary arteries            Value     Reference PA pressure, S, DP            24  mm Hg  <=30  Tricuspid valve              Value     Reference Tricuspid regurg peak velocity      227  cm/s   --------- Tricuspid peak RV-RA gradient       21  mm Hg  ---------  Systemic veins              Value     Reference Estimated CVP               3   mm Hg  ---------  Right ventricle              Value     Reference RV pressure, S, DP            24  mm Hg  <=30 RV s&', lateral, S             10  cm/s   ---------  Legend: (L) and (H) mark values outside specified reference range.  ------------------------------------------------------------------- Prepared and Electronically Authenticated by  Fransico Him, MD 2016-02-17T13:09:03      Cardiac Catheterization Procedure Note  Name: Jesus [name] MRN: [id number] DOB: 01/20/1941  Procedure: Right Heart Cath, catheter placement for angiography, Selective Coronary Angiography, LIMA angiography, saphenous vein graft angiography, aortic root angiography  Indication: Severe symptomatic aortic stenosis. This 74 year old gentleman is status post remote multivessel CABG. He has developed progressive and now critical aortic stenosis. He presents today for preoperative cardiac catheterization  Procedural Details: There was an indwelling IV in a left antecubital vein. Using normal sterile technique, the IV was changed out for a 5 Fr brachial sheath over a 0.018 inch wire. The left wrist was then prepped, draped, and anesthetized with 1% lidocaine. Using the modified Seldinger technique a 5/6 French Slender  sheath was placed in the left radial artery. Intra-arterial verapamil was administered through the radial artery sheath. IV heparin was administered after a JR4 catheter was advanced into the central aorta. A Swan-Ganz catheter was used for the right heart catheterization. Standard protocol was followed for recording of right heart pressures and sampling of oxygen saturations. Fick cardiac output was calculated. Standard Judkins catheters were used for selective coronary angiography. A JR4 catheter was used for LIMA angiography. An AL 2 catheter was used for vein graft angiography. A pigtail catheter was used at the completion of the procedure to perform aortic root angiography. There were no immediate procedural complications. The patient was transferred to the post catheterization recovery area for further monitoring.  Procedural Findings: Hemodynamics RA 3 RV 29 over 4 PA 29/6 with a mean of 14 PCWP A wave 9, V wave 8, mean of 6 LV not recorded AO 108/49 with a mean of 71  Oxygen saturations: PA 69 AO 93  Cardiac Output (Fick) 4.95  Cardiac Index (Fick) 3.1  Coronary angiography: Coronary dominance: right  Left mainstem: Severely calcified with 99% stenosis and TIMI 2 flow  Left anterior descending (LAD): 100% proximal occlusion with severe calcification  Left circumflex (LCx): 100% occlusion with severe calcification  Right coronary artery (RCA): 100% occlusion with moderate proximal calcification  LIMA to LAD: The graft is widely patent. The distal anastomosis occurs at the LAD/diagonal bifurcation and both native vessels fill briskly.  Saphenous vein graft sequential to OM1, OM 2, and OM 3: Patent graft with diffuse irregularities. The first and second OM branches are small in caliber, but patent. The third OM is large in caliber. The AV circumflex is severely calcified and it fills retrograde from the graft. The distal portion of the third OM graft has an eccentric 80%  stenosis.  Saphenous vein graft sequential to the acute marginals and PDA is patent. The graft is fairly smooth throughout. The PDA anastomosis is widely patent. The acute marginals are patent and small in caliber. There is a septal cascade of the LAD that fills from collaterals from the PDA through septal branches.  Aortic root angiography: The aortic valve is severely thickened, calcified, and restricted. There is trivial aortic insufficiency. Visualized portions of the proximal ascending aorta appear to be normal in caliber.  Left ventriculography: Deferred (aortic valve not crossed)  Estimated Blood Loss: minimal  Final Conclusions:  1. Severe native three-vessel coronary artery disease with heavily calcified and totally occluded coronary arteries 2. Status post aortocoronary bypass surgery with continued patency of the LIMA to LAD, saphenous vein graft sequential to OM branches, and saphenous vein graft sequential to RCA branches. 3. Severely calcified aortic valve with known critical aortic stenosis 4. Well-preserved intra-coronary hemodynamics as measured at rest 5. Severe stenosis of the distal OM 3 branch after the insertion of the saphenous vein graft.  Recommendations: Further evaluation by Dr Roxy Manns for aortic valve replacement (redo conventional AVR versus TAVR).  [name] MD, [facility] 01/26/2015, 10:23 AM     STS Risk Calculator  Procedure    Redo CABG + AVR  Risk of Mortality   8.0% Morbidity or Mortality  34.0% Prolonged LOS   19.0% Short LOS    16.8% Permanent Stroke   3.1% Prolonged Vent Support  24.9% DSW Infection    0.4% Renal Failure    8.4% Reoperation    15.0%    Impression:  Patient has stage D severe symptomatic aortic stenosis. He presents with progressive symptoms of exertional shortness of breath and fatigue consistent with chronic diastolic congestive heart failure, New [place] Heart Association functional class II. I have personally reviewed the  patient's recent transthoracic echocardiogram and diagnostic cardiac catheterization.  The patient now has critical aortic stenosis with heavy calcification and severe restriction of leaflet mobility involving all 3 leaflets of the aortic valve. Left ventricular systolic function remains stable with moderate chronic systolic dysfunction. Diagnostic cardiac catheterization demonstrates continued patency of most of the bypass graft from the patient's previous coronary artery bypass surgery in 1997 with adequate coronary circulation to all 3 major vascular territories. There is a fairly high-grade stenosis in the third obtuse marginal branch of the left circumflex system beyond the insertion of the saphenous vein graft. Risks associated with conventional surgical aortic valve replacement with or without redo coronary artery bypass grafting would unquestionably be high because of the patient's advanced age, previous coronary artery bypass surgery, left ventricular systolic dysfunction, and relatively small physical stature. I feel that catheter aortic valve replacement would be an attractive alternative to high-risk conventional surgery.  Plan:  The patient and his wife were counseled at length regarding treatment alternatives for management of severe symptomatic aortic stenosis. Alternative approaches such as conventional aortic valve replacement, transcatheter aortic valve replacement, and palliative medical therapy were compared and contrasted at length.  The risks associated with conventional surgical aortic valve replacement were been discussed in detail, as were expectations for post-operative convalescence. Long-term prognosis with medical therapy was discussed. This discussion was placed in the context of the patient's own specific clinical presentation and past medical history.  All of their questions been addressed.  We plan to proceed with CT angiography to assess the anatomical feasibility of  transcatheter aortic valve replacement and potential alternatives for vascular access.  The patient will undergo pulmonary function tests and a formal physical therapy evaluation to assess his baseline strength, mobility, and frailty.  Once his diagnostic workup has been completed his case will be reviewed by a multidisciplinary team at specialists to determine optimal course of therapy. The patient will return in 3-4 weeks to review the results of tests and make further plans for definitive intervention. In the meanwhile, the patient's wife is to be scheduled for elective cholecystectomy in the near future. She hopes to recover from her surgery before the patient will undergo definitive management for his aortic stenosis.   I spent in excess of 90 minutes during the conduct of this office consultation and >50% of this time involved direct face-to-face encounter with the patient for counseling and/or coordination of their care.    Valentina Gu. Roxy Manns, MD 02/06/2015 4:48 PM

## 2015-02-09 ENCOUNTER — Other Ambulatory Visit: Payer: Self-pay | Admitting: *Deleted

## 2015-02-09 DIAGNOSIS — I350 Nonrheumatic aortic (valve) stenosis: Secondary | ICD-10-CM

## 2015-02-10 ENCOUNTER — Ambulatory Visit (INDEPENDENT_AMBULATORY_CARE_PROVIDER_SITE_OTHER): Payer: Medicare Other | Admitting: General Practice

## 2015-02-10 DIAGNOSIS — Z5181 Encounter for therapeutic drug level monitoring: Secondary | ICD-10-CM | POA: Diagnosis not present

## 2015-02-10 LAB — POCT INR: INR: 1.9

## 2015-02-10 NOTE — Progress Notes (Signed)
Pre visit review using our clinic review tool, if applicable. No additional management support is needed unless otherwise documented below in the visit note. 

## 2015-02-10 NOTE — Progress Notes (Signed)
Agree with plan 

## 2015-02-12 ENCOUNTER — Telehealth: Payer: Self-pay | Admitting: Family Medicine

## 2015-02-12 NOTE — Telephone Encounter (Signed)
Patient would like to let Dr. Elease Hashimoto know he is having heart surgery 03/16/15.

## 2015-02-13 ENCOUNTER — Encounter (HOSPITAL_COMMUNITY): Payer: Self-pay

## 2015-02-13 ENCOUNTER — Ambulatory Visit (HOSPITAL_COMMUNITY)
Admission: RE | Admit: 2015-02-13 | Discharge: 2015-02-13 | Disposition: A | Discharge status: Home / Self Care | Payer: Medicare Other | Source: Ambulatory Visit | Attending: Thoracic Surgery (Cardiothoracic Vascular Surgery) | Admitting: Thoracic Surgery (Cardiothoracic Vascular Surgery)

## 2015-02-13 DIAGNOSIS — J9811 Atelectasis: Secondary | ICD-10-CM | POA: Diagnosis not present

## 2015-02-13 DIAGNOSIS — I350 Nonrheumatic aortic (valve) stenosis: Secondary | ICD-10-CM

## 2015-02-13 DIAGNOSIS — R0609 Other forms of dyspnea: Secondary | ICD-10-CM | POA: Diagnosis not present

## 2015-02-13 DIAGNOSIS — K314 Gastric diverticulum: Secondary | ICD-10-CM | POA: Diagnosis not present

## 2015-02-13 DIAGNOSIS — Z01818 Encounter for other preprocedural examination: Secondary | ICD-10-CM | POA: Insufficient documentation

## 2015-02-13 LAB — PULMONARY FUNCTION TEST
DL/VA % pred: 114 %
DL/VA: 4.66 ml/min/mmHg/L
DLCO UNC % PRED: 79 %
DLCO unc: 18.24 ml/min/mmHg
FEF 25-75 POST: 2.01 L/s
FEF 25-75 Pre: 1.82 L/sec
FEF2575-%Change-Post: 10 %
FEF2575-%Pred-Post: 129 %
FEF2575-%Pred-Pre: 117 %
FEV1-%Change-Post: -1 %
FEV1-%Pred-Post: 84 %
FEV1-%Pred-Pre: 85 %
FEV1-Post: 1.84 L
FEV1-Pre: 1.87 L
FEV1FVC-%Change-Post: 0 %
FEV1FVC-%Pred-Pre: 115 %
FEV6-%CHANGE-POST: -1 %
FEV6-%PRED-PRE: 79 %
FEV6-%Pred-Post: 77 %
FEV6-POST: 2.21 L
FEV6-Pre: 2.25 L
FEV6FVC-%PRED-POST: 107 %
FEV6FVC-%Pred-Pre: 107 %
FVC-%Change-Post: -1 %
FVC-%Pred-Post: 72 %
FVC-%Pred-Pre: 73 %
FVC-Post: 2.21 L
FVC-Pre: 2.25 L
Post FEV1/FVC ratio: 83 %
Post FEV6/FVC ratio: 100 %
Pre FEV1/FVC ratio: 83 %
Pre FEV6/FVC Ratio: 100 %
RV % PRED: 109 %
RV: 2.41 L
TLC % pred: 82 %
TLC: 4.62 L

## 2015-02-13 MED ORDER — IOHEXOL 350 MG/ML SOLN
80.0000 mL | Freq: Once | INTRAVENOUS | Status: AC | PRN
  Administered 2015-02-13: 80 mL via INTRAVENOUS

## 2015-02-13 MED ORDER — ALBUTEROL SULFATE (2.5 MG/3ML) 0.083% IN NEBU
2.5000 mg | INHALATION_SOLUTION | Freq: Once | RESPIRATORY_TRACT | Status: AC
  Administered 2015-02-13: 2.5 mg via RESPIRATORY_TRACT

## 2015-02-25 ENCOUNTER — Encounter: Payer: Self-pay | Admitting: Surgery

## 2015-02-25 ENCOUNTER — Encounter: Payer: Self-pay | Admitting: Physical Therapy

## 2015-02-25 ENCOUNTER — Other Ambulatory Visit: Payer: Self-pay | Admitting: *Deleted

## 2015-02-25 ENCOUNTER — Ambulatory Visit: Payer: Medicare Other | Attending: Thoracic Surgery (Cardiothoracic Vascular Surgery) | Admitting: Physical Therapy

## 2015-02-25 ENCOUNTER — Institutional Professional Consult (permissible substitution) (INDEPENDENT_AMBULATORY_CARE_PROVIDER_SITE_OTHER): Payer: Medicare Other | Admitting: Surgery

## 2015-02-25 VITALS — BP 121/64 | HR 57 | Resp 16 | Ht 62.0 in | Wt 131.0 lb

## 2015-02-25 DIAGNOSIS — M6281 Muscle weakness (generalized): Secondary | ICD-10-CM | POA: Insufficient documentation

## 2015-02-25 DIAGNOSIS — I350 Nonrheumatic aortic (valve) stenosis: Secondary | ICD-10-CM | POA: Diagnosis not present

## 2015-02-25 DIAGNOSIS — I4891 Unspecified atrial fibrillation: Secondary | ICD-10-CM

## 2015-02-25 DIAGNOSIS — R293 Abnormal posture: Secondary | ICD-10-CM | POA: Insufficient documentation

## 2015-02-25 DIAGNOSIS — Z8679 Personal history of other diseases of the circulatory system: Secondary | ICD-10-CM

## 2015-02-25 DIAGNOSIS — Z951 Presence of aortocoronary bypass graft: Secondary | ICD-10-CM

## 2015-02-25 NOTE — Therapy (Signed)
[place], [name], [address] Phone: [phone]   Fax:  [phone]  Physical Therapy Evaluation  Patient Details  Name: Jesus [name] MRN: [id number] Date of Birth: September 16, 1941 Referring Provider:  Rexene Alberts, MD  Encounter Date: 02/25/2015      PT End of Session - 02/25/15 1450    Visit Number 1   Number of Visits 1   PT Start Time 1400   PT Stop Time 1443   PT Time Calculation (min) 43 min      Past Medical History  Diagnosis Date  . Esophageal reflux   . Atrial fibrillation     holding sinus rhythm on Amiodarone  . Aortic stenosis     mild with a mean aortic valve gradient of 12 mmHg  . CAD (coronary artery disease) 1997    CABG  . Unspecified essential hypertension   . History of transesophageal echocardiography (TEE) for monitoring   . Cardiomyopathy   . Other and unspecified hyperlipidemia   . Skin lesions, generalized     facial which may represent actinic keratoses and possible photosensitivity from Amiodarone  . GERD (gastroesophageal reflux disease)   . History of colonoscopy   . 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 Acute myocardial infarction of inferior wall 1980    Past Surgical History  Procedure Laterality Date  . Tonsillectomy    . Coronary artery bypass graft  02/09/1996    LIMA to diag-LAD, SVG to OM1-OM2-OM3, SVG to [facility]  . Cholecystectomy    . Cardioversion  11/18/2006    Dr. [name]  . Cataract extraction w/ intraocular lens implant  April '13  (Dr. [name])    left eye only  . Left and right heart catheterization with coronary/graft angiogram N/A 01/26/2015    Procedure: LEFT AND RIGHT HEART CATHETERIZATION WITH [name];  Surgeon: [name], MD;  Location: [facility] CATH LAB;  Service: Cardiovascular;  Laterality:  N/A;    There were no vitals filed for this visit.  Visit Diagnosis:  Abnormal posture - Plan: PT plan of care cert/re-cert  Generalized muscle weakness - Plan: PT plan of care cert/re-cert      Subjective Assessment - 02/25/15 1406    Symptoms About 6 months ago patient started to notice fatigue when walking up hills and general SOB with exertion. He also reports some chest tightness with activity that is relieved with rest.   Patient Stated Goals Stay active and mobile   Currently in Pain? No/denies            [facility] PT Assessment - 02/25/15 0001    Assessment   Medical Diagnosis severe aortic stenosis   Onset Date 08/27/14  approximate   Precautions   Precautions None   Restrictions   Weight Bearing Restrictions No   Balance Screen   Has the patient fallen in the past 6 months Yes   How many times? 1   Has the patient had a decrease in activity level because of a fear of falling?  No   Is the patient reluctant to leave their home because of a fear of falling?  No   Home Environment   Living Enviornment Private residence   Type of [place] to enter   Entrance Stairs-Number of Steps 1   Entrance  Stairs-Rails None   Home Layout One level   Prior Function   Level of Independence --  independent   Posture/Postural Control   Posture/Postural Control Postural limitations   Postural Limitations Forward head;Increased thoracic kyphosis  R>L for kyphosis   ROM / Strength   AROM / PROM / Strength AROM;Strength   AROM   Overall AROM Comments Grossly WNL    Strength   Overall Strength Comments 4/5 grossly throughout   Strength Assessment Site Hand   Right/Left hand Right;Left   Grip (lbs) R 58 lbs   Grip (lbs) L 59 lbs   Ambulation/Gait   Ambulation/Gait Yes   Ambulation/Gait Assistance 7: Independent   Gait Comments Pt ambulates safely without AD or assistance.           [facility] Pre-Surgical Assessment - 2015/03/22 0001    5 Meter Walk  Test- trial 1 4.8 sec   5 Meter Walk Test- trial 2 5 sec.    5 Meter Walk Test- trial 3 3.8 sec.  </= 6 sec WNL   5 meter walk test average 4.53 sec   Timed Up & Go Test trial  11.6 sec.   Comments </= 12 sec WNL   4 Stage Balance Test tolerated for:  6 sec.   4 Stage Balance Test Position 4   comment not indicative of high fall risk   Sit To Stand Test- trial 1 13.3 sec.  </= 12.6 WNL for age/gender   ADL/IADL Independent with: Bathing;Dressing;Meal prep;Finances;Yard work   ADL/IADL Therapist, sports Index Vulnerable   6 Minute Walk- Baseline yes   BP (mmHg) 130/72 mmHg   HR (bpm) 61   02 Sat (%RA) 97 %   Modified Borg Scale for Dyspnea 0- Nothing at all   Perceived Rate of Exertion (Borg) 6-   6 Minute Walk Post Test yes   BP (mmHg) 140/76 mmHg   HR (bpm) 81   02 Sat (%RA) 94 %   Modified Borg Scale for Dyspnea 0- Nothing at all   Perceived Rate of Exertion (Borg) 9- very light   Aerobic Endurance Distance Walked 1300   Endurance additional comments required no rest breaks and reported no chest tightness                                     Plan - 2015-03-22 1450    Clinical Impression Statement Pt is a 74 yo male presenting to OP PT with c/o SOB and chest tightness with activity, onset about 6 months ago. Pt is currently undergoing pre-TAVR medical work-up. Pt continues to be independent with housework and continues to work but reports slowing down and resting more often due to SOB and chest tightness. Pt is not at significant fall risk.   Rehab Potential Good   PT Frequency One time visit   Consulted and Agree with Plan of Care Patient          G-Codes - 2015-03-22 1454    Functional Assessment Tool Used 6 minute walk 1300 feet   Functional Limitation Mobility: Walking and moving around   Mobility: Walking and Moving Around Current Status (Y0998) At least 1 percent but less than 20 percent impaired, limited or restricted   Mobility: Walking and Moving  Around Goal Status ([id number]) At least 1 percent but less than 20 percent impaired, limited or restricted   Mobility: Walking and Moving Around Discharge Status [phone]) At least  1 percent but less than 20 percent impaired, limited or restricted       Problem List Patient Active Problem List   Diagnosis Date Noted  . Anemia 09/03/2014  . Encounter for therapeutic drug monitoring 12/31/2013  . Yellow jacket sting 08/06/2013  . Low back pain on right side with sciatica 04/24/2013  . Severe aortic stenosis 08/10/2012  . CAD (coronary artery disease) 08/10/2012  . Left ventricular dysfunction 08/10/2012  . Routine health maintenance 07/12/2012  . BEE STING REACTION, LOCAL 09/21/2010  . BEN LOC HYPERPLASIA PROS W/UR OBST & OTH LUTS 08/02/2010  . Acute and chronic cholecystitis 02/01/2010  . Abdominal pain, generalized 01/29/2010  . CORONARY ATHEROSCLEROSIS NATIVE CORONARY ARTERY 10/13/2009  . HYPERTHYROIDISM 05/22/2009  . INSOMNIA 05/22/2009  . HYPERLIPIDEMIA-MIXED 04/22/2009  . CARDIOMYOPATHY, ISCHEMIC 04/22/2009  . CAD, AUTOLOGOUS BYPASS GRAFT 10/16/2008  . HYPERTENSION 09/07/2007  . Aortic valve disorder 09/07/2007  . FIBRILLATION, ATRIAL 09/07/2007  . GASTROESOPHAGEAL REFLUX DISEASE 09/07/2007  . 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 [place], [name], [address] Phone: [phone]   Fax:  [phone]

## 2015-02-26 ENCOUNTER — Encounter: Payer: Self-pay | Admitting: Surgery

## 2015-02-26 NOTE — Progress Notes (Signed)
[facility]    CARDIOTHORACIC SURGERY CONSULTATION REPORT  Referring Provider is [name], MD PCP is [name], MD  Chief Complaint  Patient presents with  . Aortic Stenosis    2ND TAVR EVAL    HPI:  Mr. Clenney is a 74 year old male with long-standing history of coronary artery disease with ischemic cardiomyopathy s/p CABG x 8 in 1997 by Dr. [name]. His grafts included a left internal mammary artery sewn sequentially to the diagonal branch and to the left anterior descending coronary artery, saphenous vein graft sewn sequentially to the OM1, OM2, and OM3 branches of the left circumflex coronary artery, and saphenous vein graft sewn sequentially to the AM1, AM2, and posterior descending coronary artery. The patient did well for many years but subsequently developed aortic stenosis which slowly progressed on follow-up echocardiograms. For the last several years he has been followed by Dr. [name]. Over the past year the patient has developed gradual progression of exertional shortness of breath and fatigue.Recently he has developed some mild tightness across his chest associated with exertion. He denies any symptoms occurring at rest. He denies any PND, orthopnea, or lower extremity edema. He was seen in follow-up recently by Dr. [name], and repeat transthoracic echocardiogram revealed further progression in the severity of aortic stenosis with peak velocity across aortic valve measured at 4.8 m/sec corresponding to estimated peak and mean transvalvular gradients of 104 and 57 mmHg, respectively. Left ventricular systolic function remains stable with ejection fraction estimated at 45-50%.The patient subsequently underwent left and right heart catheterization demonstrating severe coronary artery disease with 100% chronic occlusion of the native coronary vessels but continued patency of the majority of bypass grafts placed at the  time of surgery in 1997. There was significant stenosis of the third obtuse marginal branch of left circumflex coronary artery beyond the insertion of the vein graft. Pulmonary artery pressures and cardiac output were normal.  The patient lives with his wife who has cholelithiasis and needs a laparoscopic cholecystectomy in the near future. 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   Past Medical History  Diagnosis Date  . Esophageal reflux   . Atrial fibrillation     holding sinus rhythm on Amiodarone  . Aortic stenosis     mild with a mean aortic valve gradient of 12 mmHg  . CAD (coronary artery disease) 1997    CABG  . Unspecified essential hypertension   . History of transesophageal echocardiography (TEE) for monitoring   . Cardiomyopathy   . Other and unspecified hyperlipidemia   . Skin lesions, generalized     facial which may represent actinic keratoses and possible photosensitivity from Amiodarone  . GERD (gastroesophageal reflux disease)   . History of colonoscopy   . S/P CABG (coronary artery bypass graft) 02/09/1996    LIMA to diag-LAD, SVG to OM1-OM2-OM3, SVG to [facility], open SVG harvest from right leg - Dr [name]  . Acute myocardial infarction of inferior wall 1980    Past Surgical History  Procedure Laterality Date  . Tonsillectomy    . Coronary artery bypass graft  02/09/1996    LIMA to diag-LAD, SVG to OM1-OM2-OM3, SVG to [facility]  . Cholecystectomy    . Cardioversion  11/18/2006    Dr. [name]  . Cataract extraction w/ intraocular lens implant  April '13  (Dr. [name])    left eye  only  . Left and right heart catheterization with coronary/graft angiogram N/A 01/26/2015    Procedure: LEFT AND RIGHT HEART CATHETERIZATION WITH [name];  Surgeon: [name], MD;  Location: [facility] CATH LAB;  Service: Cardiovascular;  Laterality: N/A;     Family History  Problem Relation Age of Onset  . Stroke Mother 47    Deceased  . Heart attack Father 63    Deceased  . Atrial fibrillation Brother   . Crohn's disease Mother     Deceased  . Heart disease Father   . Heart disease Paternal Uncle   . Prostate cancer Paternal Uncle   . Heart failure Father     Deceased    History   Social History  . Marital Status: Married    Spouse Name: N/A  . Number of Children: N/A  . Years of Education: N/A   Occupational History  . Not on file.   Social History Main Topics  . Smoking status: Never Smoker   . Smokeless tobacco: Never Used     Comment: Does not smoke.  [name] Alcohol Use: No  . Drug Use: No  . Sexual Activity: Not Currently   Other Topics Concern  . Not on file   Social History Narrative   [employer]G. [place] 6 years. Married - '69 - 1 year/divorced. '76 - . No children. Work - mfg/textiles - Designer, multimedia; currently works doing maintenance. [employer] - they have discussed this. Provided packet august '13.                 Current Outpatient Prescriptions  Medication Sig Dispense Refill  . amiodarone (PACERONE) 200 MG tablet Take 0.5 tablets (100 mg total) by mouth daily. 30 tablet 2  . amLODipine (NORVASC) 5 MG tablet Take 1 tablet (5 mg total) by mouth daily. 30 tablet 3  . Ascorbic Acid (VITAMIN C) 1000 MG tablet Take 1,000 mg by mouth daily.      [name] aspirin 81 MG tablet Take 81 mg by mouth daily.      [name] atorvastatin (LIPITOR) 80 MG tablet Take 0.5 tablets (40 mg total) by mouth daily. 45 tablet 5  . loratadine (CLARITIN) 10 MG tablet Take 10 mg by mouth daily as needed.      [name] losartan (COZAAR) 100 MG tablet Take 1 tablet (100 mg total) by mouth daily. 30 tablet 5  . Multiple Vitamins-Minerals (MULTIVITAMIN,TX-MINERALS) tablet Take 1 tablet by mouth daily.      . pantoprazole (PROTONIX) 40 MG tablet Take 1 tablet (40 mg total) by mouth daily. 90 tablet 2  . triamcinolone cream (KENALOG) 0.5 % Apply 1  application topically 3 (three) times daily as needed.    . warfarin (COUMADIN) 5 MG tablet Take as directed by coumadin clinic. 30 tablet 3   No current facility-administered medications for this visit.    Allergies  Allergen Reactions  . Codeine Other (See Comments)    Pt does not remember      Review of Systems:            General:normal appetite, decreased energy, no weight gain, no weight loss, no fever Cardiac:+ chest pain with exertion, no chest pain at rest, + SOB with exertion, no resting SOB, no PND, no  orthopnea, no palpitations, + arrhythmia, + atrial fibrillation, no LE edema, no dizzy spells, no                                                            syncope Respiratory:+ exertional shortness of breath, no home oxygen, no productive cough, + recent dry cough, no                                                       bronchitis, no wheezing, no hemoptysis, no asthma, no pain with inspiration or cough, + sleep                                                         apnea, no CPAP at night BW:[id number] difficulty swallowing, + reflux, no frequent heartburn, no hiatal hernia, no abdominal pain,                                                 no constipation, no diarrhea, no hematochezia, no hematemesis, no melena GU:no dysuria, no frequency, no urinary tract infection, no hematuria, no enlarged prostate, no kidney                                                   stones, no kidney disease Vascular:no pain suggestive of claudication, no pain in feet, no leg cramps, no varicose veins, no DVT, no                                                     non-healing foot ulcer Neuro:no stroke, no TIA's, no  seizures, no headaches, no temporary blindness one eye, no slurred                                                            speech, no peripheral neuropathy, no chronic pain, no instability of gait, no memory/cognitive                                                           dysfunction Musculoskeletal:+ arthritis, no joint swelling, no myalgias, no difficulty walking, normal mobility  Skin:no rash, no itching, no  skin infections, no pressure sores or ulcerations Psych:no anxiety, no depression, no nervousness, no unusual recent stress Eyes:no blurry vision, no floaters, no recent vision changes, + wears glasses or contacts ENT:+ hearing loss and wears hearing aids, no loose or painful teeth, no dentures, last saw dentist December 2015 Hematologic:+ easy bruising, no abnormal bleeding, no clotting disorder, no frequent epistaxis Endocrine:no diabetes, does not check CBG's at home            Physical Exam:   BP 121/64 mmHg  Pulse 57  Resp 16  Ht 5\' 2"  (1.575 m)  Wt 131 lb (59.421 kg)  BMI 23.95 kg/m2  SpO2 96%  General:             well-appearing white male in no distress  HEENT:  Unremarkable, NCAT, PERLA, EOMI, oropharynx clear  Neck:   no JVD, no bruits, no adenopathy or thyromegaly.  Chest:   clear to auscultation, symmetrical breath sounds, no wheezes, no rhonchi   CV:   RRR, grade III/VI crescendo/decrescendo murmur heard best at RUSB,  no diastolic murmur  Abdomen:  soft, non-tender, no masses or organomegaly  Extremities:  warm, well-perfused, pulses palpable in feet, no LE edema  Rectal/GU  Deferred  Neuro:   Grossly non-focal and symmetrical throughout  Skin:   Clean and dry, no rashes, no breakdown   Diagnostic  Tests:  Transthoracic Echocardiography  Patient:  Advit, Trethewey MR #:    [id number] Study Date: 01/21/2015 Gender:   M Age:    35 Height:   157.5 cm Weight:   60.8 kg BSA:    1.64 m^2 Pt. Status: Room:  ATTENDING  Fransico Him, MD SONOGRAPHER Oletta Lamas, Will ORDERING   [place], Outpatient  cc:  ------------------------------------------------------------------- LV EF: 45% -  50%  ------------------------------------------------------------------- Indications:   (I35.0).  ------------------------------------------------------------------- History:  PMH: Paroxysmal atrial fibrillation. Acquired from the patient and from the patient&'s chart. Coronary artery disease. Aortic stenosis. Aortic valve disease. PMH:  Myocardial infarction.  ------------------------------------------------------------------- Study Conclusions  - Left ventricle: The cavity size was normal. Systolic function was mildly reduced. The estimated ejection fraction was in the range of 45% to 50%. There is akinesis of the midinferior myocardium. Doppler parameters are consistent with elevated ventricular end-diastolic filling pressure. - Aortic valve: Severe thickening and calcification. Valve mobility was severely restricted and the right coronary cusp appears fixed. There was critical stenosis. There was trivial regurgitation. - Mitral valve: There was mild regurgitation. - Left atrium: The atrium was severely dilated. - Tricuspid valve: There was mild regurgitation.  ------------------------------------------------------------------- Labs, prior tests, procedures, and surgery: Coronary artery bypass grafting.  Transthoracic echocardiography. M-mode, complete 2D, spectral Doppler, and color Doppler. Birthdate: Patient birthdate: Feb 13, 1941. Age: Patient is 74 yr old. Sex: Gender:  male. BMI: 24.5 kg/m^2. Blood pressure:   143/70 Patient status: Outpatient. Study date: Study date: 01/21/2015. Study time: 08:31 AM. Location: [facility]  -------------------------------------------------------------------  ------------------------------------------------------------------- Left ventricle: The cavity size was normal. Systolic function was mildly reduced. The estimated ejection fraction was in the range of 45% to 50%. Regional wall motion abnormalities:  There is akinesis of the midinferior myocardium. Doppler parameters are consistent with elevated ventricular end-diastolic filling pressure.  ------------------------------------------------------------------- Aortic valve:  Trileaflet. Severe thickening and calcification. Valve mobility was severely restricted and the right coronary cusp appears fixed. Doppler:  There was critical stenosis.  There was trivial regurgitation.  VTI ratio of LVOT to aortic valve: 0.12. Valve  area (VTI): 0.38 cm^2. Indexed valve area (VTI): 0.23 cm^2/m^2. Valve area (Vmax): 0.4 cm^2. Indexed valve area (Vmax): 0.24 cm^2/m^2. Mean velocity ratio of LVOT to aortic valve: 0.12. Valve area (Vmean): 0.39 cm^2. Indexed valve area (Vmean): 0.24 cm^2/m^2.  Mean gradient (S): 57 mm Hg. Peak gradient (S): 104 mm Hg.  ------------------------------------------------------------------- Aorta: Aortic root: The aortic root was normal in size.  ------------------------------------------------------------------- Mitral valve:  Mildly thickened leaflets . Mobility was not restricted. Doppler: Transvalvular velocity was within the normal range. There was no evidence for stenosis. There was mild regurgitation.  Peak gradient (D): 4 mm Hg.  ------------------------------------------------------------------- Left atrium: The atrium was severely  dilated.  ------------------------------------------------------------------- Right ventricle: The cavity size was normal. Wall thickness was normal. Systolic function was normal.  ------------------------------------------------------------------- Pulmonic valve:  Structurally normal valve.  Cusp separation was normal. Doppler: Transvalvular velocity was within the normal range. There was no evidence for stenosis. There was no regurgitation.  ------------------------------------------------------------------- Tricuspid valve:  Structurally normal valve.  Doppler: Transvalvular velocity was within the normal range. There was mild regurgitation.  ------------------------------------------------------------------- Pulmonary artery:  The main pulmonary artery was normal-sized. Systolic pressure was within the normal range.  ------------------------------------------------------------------- Right atrium: The atrium was normal in size.  ------------------------------------------------------------------- Pericardium: There was no pericardial effusion.  ------------------------------------------------------------------- Systemic veins: Inferior vena cava: The vessel was normal in size.  ------------------------------------------------------------------- Measurements  Left ventricle              Value     Reference LV ID, ED, PLAX chordal      (H)   55.3 mm    43 - 52 LV ID, ES, PLAX chordal      (H)   44.3 mm    23 - 38 LV fx shortening, PLAX chordal  (L)   20  %    >=29 LV PW thickness, ED            8.77 mm    --------- IVS/LV PW ratio, ED            1.28      <=1.3 Stroke volume, 2D             51  ml    --------- Stroke volume/bsa, 2D           31  ml/m^2  --------- LV ejection fraction, 1-p A4C       44  %    --------- LV  end-diastolic volume, 2-p       147  ml    --------- LV end-systolic volume, 2-p        82  ml    --------- LV ejection fraction, 2-p         44  %    --------- Stroke volume, 2-p            65  ml    --------- LV end-diastolic volume/bsa, 2-p     90  ml/m^2  --------- LV end-systolic volume/bsa, 2-p      50  ml/m^2  --------- Stroke volume/bsa, 2-p          39.6 ml/m^2  --------- LV e&', lateral              7.21 cm/s   --------- LV E/e&', lateral             13.41     --------- LV e&', medial               4.78 cm/s   --------- LV  E/e&', medial              20.23     --------- LV e&', average              6   cm/s   --------- LV E/e&', average             16.13     ---------  Ventricular septum            Value     Reference IVS thickness, ED             11.2 mm    ---------  LVOT                   Value     Reference LVOT ID, S                20  mm    --------- LVOT area                 3.14 cm^2   --------- LVOT ID                  20  mm    --------- LVOT peak velocity, S           64.7 cm/s   --------- LVOT mean velocity, S           43.5 cm/s   --------- LVOT VTI, S                16.1 cm    --------- LVOT peak gradient, S           2   mm Hg  --------- Stroke volume (SV), LVOT DP        50.6 ml    --------- Stroke index (SV/bsa), LVOT DP      30.8 ml/m^2  ---------  Aortic valve               Value     Reference Aortic valve mean velocity, S       351  cm/s   --------- Aortic valve VTI, S            132  cm     --------- Aortic mean gradient, S          57  mm Hg  --------- Aortic peak gradient, S          104  mm Hg  --------- VTI ratio, LVOT/AV            0.12      --------- Aortic valve area, VTI          0.38 cm^2   --------- Aortic valve area/bsa, VTI        0.23 cm^2/m^2 --------- Aortic valve area, peak velocity     0.4  cm^2   --------- Aortic valve area/bsa, peak        0.24 cm^2/m^2 --------- velocity Velocity ratio, mean, LVOT/AV       0.12      --------- Aortic valve area, mean velocity     0.39 cm^2   --------- Aortic valve area/bsa, mean        0.24 cm^2/m^2 --------- velocity Aortic regurg pressure half-time     464  ms    ---------  Aorta                   Value     Reference Aortic root ID, ED  34  mm    --------- Ascending aorta ID, A-P, S        33  mm    ---------  Left atrium                Value     Reference LA ID, A-P, ES              46  mm    --------- LA ID/bsa, A-P          (H)   2.8  cm/m^2  <=2.2 LA volume, S               95  ml    --------- LA volume/bsa, S             57.9 ml/m^2  --------- LA volume, ES, 1-p A4C          102  ml    --------- LA volume/bsa, ES, 1-p A4C        62.1 ml/m^2  --------- LA volume, ES, 1-p A2C          86  ml    --------- LA volume/bsa, ES, 1-p A2C        52.4 ml/m^2  ---------  Mitral valve               Value     Reference Mitral E-wave peak velocity        96.7 cm/s   --------- Mitral A-wave peak velocity        87.4 cm/s   --------- Mitral deceleration time     (H)   408  ms    150 - 230 Mitral peak gradient, D          4    mm Hg  --------- Mitral E/A ratio, peak          1.1      ---------  Pulmonary arteries            Value     Reference PA pressure, S, DP            24  mm Hg  <=30  Tricuspid valve              Value     Reference Tricuspid regurg peak velocity      227  cm/s   --------- Tricuspid peak RV-RA gradient       21  mm Hg  ---------  Systemic veins              Value     Reference Estimated CVP               3   mm Hg  ---------  Right ventricle              Value     Reference RV pressure, S, DP            24  mm Hg  <=30 RV s&', lateral, S             10  cm/s   ---------  Legend: (L) and (H) mark values outside specified reference range.  ------------------------------------------------------------------- Prepared and Electronically Authenticated by  Fransico Him, MD 2016-02-17T13:09:03      Cardiac Catheterization Procedure Note  Name: WILTON THRALL MRN: [id number] DOB: [birth year]  Procedure: Right Heart Cath, catheter placement for angiography, Selective Coronary Angiography, LIMA angiography, saphenous vein graft angiography, aortic root angiography  Indication: Severe symptomatic aortic stenosis. This 74 year old  gentleman is status post remote multivessel CABG. He has developed progressive and now critical aortic stenosis. He presents today for preoperative cardiac catheterization  Procedural Details: There was an indwelling IV in a left antecubital vein. Using normal sterile technique, the IV was changed out for a 5 Fr brachial sheath over a 0.018 inch wire. The left wrist was then prepped, draped, and anesthetized with 1% lidocaine. Using the modified Seldinger technique a 5/6 French Slender sheath was placed in the left radial artery. Intra-arterial verapamil was administered  through the radial artery sheath. IV heparin was administered after a JR4 catheter was advanced into the central aorta. A Swan-Ganz catheter was used for the right heart catheterization. Standard protocol was followed for recording of right heart pressures and sampling of oxygen saturations. Fick cardiac output was calculated. Standard Judkins catheters were used for selective coronary angiography. A JR4 catheter was used for LIMA angiography. An AL 2 catheter was used for vein graft angiography. A pigtail catheter was used at the completion of the procedure to perform aortic root angiography. There were no immediate procedural complications. The patient was transferred to the post catheterization recovery area for further monitoring.  Procedural Findings: Hemodynamics RA 3 RV 29 over 4 PA 29/6 with a mean of 14 PCWP A wave 9, V wave 8, mean of 6 LV not recorded AO 108/49 with a mean of 71  Oxygen saturations: PA 69 AO 93  Cardiac Output (Fick) 4.95  Cardiac Index (Fick) 3.1  Coronary angiography: Coronary dominance: right  Left mainstem: Severely calcified with 99% stenosis and TIMI 2 flow  Left anterior descending (LAD): 100% proximal occlusion with severe calcification  Left circumflex (LCx): 100% occlusion with severe calcification  Right coronary artery (RCA): 100% occlusion with moderate proximal calcification  LIMA to LAD: The graft is widely patent. The distal anastomosis occurs at the LAD/diagonal bifurcation and both native vessels fill briskly.  Saphenous vein graft sequential to OM1, OM 2, and OM 3: Patent graft with diffuse irregularities. The first and second OM branches are small in caliber, but patent. The third OM is large in caliber. The AV circumflex is severely calcified and it fills retrograde from the graft. The distal portion of the third OM graft has an eccentric 80% stenosis.  Saphenous vein graft sequential to the acute marginals and PDA is  patent. The graft is fairly smooth throughout. The PDA anastomosis is widely patent. The acute marginals are patent and small in caliber. There is a septal cascade of the LAD that fills from collaterals from the PDA through septal branches.  Aortic root angiography: The aortic valve is severely thickened, calcified, and restricted. There is trivial aortic insufficiency. Visualized portions of the proximal ascending aorta appear to be normal in caliber.  Left ventriculography: Deferred (aortic valve not crossed)  Estimated Blood Loss: minimal  Final Conclusions:  1. Severe native three-vessel coronary artery disease with heavily calcified and totally occluded coronary arteries 2. Status post aortocoronary bypass surgery with continued patency of the LIMA to LAD, saphenous vein graft sequential to OM branches, and saphenous vein graft sequential to RCA branches. 3. Severely calcified aortic valve with known critical aortic stenosis 4. Well-preserved intra-coronary hemodynamics as measured at rest 5. Severe stenosis of the distal OM 3 branch after the insertion of the saphenous vein graft.  Recommendations: Further evaluation by Dr Roxy Manns for aortic valve replacement (redo conventional AVR versus TAVR).  [name] MD, [facility] 01/26/2015, 10:23 AM     STS Risk Calculator  ProcedureRedo CABG + AVR  Risk of Mortality8.0% Morbidity or Mortality34.0% Prolonged LOS19.0% Short LOS16.8% Permanent Stroke3.1% Prolonged Vent Support24.9% DSW Infection0.4% Renal Failure8.4% Reoperation15.0%        ADDENDUM REPORT: 02/16/2015 08:18  EXAM: OVER-READ  INTERPRETATION CT CHEST  The following report is an over-read performed by radiologist Dr. Alvino Blood [facility] on 02/16/2015. This over-read does not include interpretation of cardiac or coronary anatomy or pathology. The cardiac CTA interpretation by the cardiologist is attached.  COMPARISON: CT 02/13/2015  FINDINGS: Mild basilar atelectasis. Suspicious pulmonary nodules. Tiny subpleural nodule in the left lower lobe measures 3 mm (image 45 series 414) is likely benign. No axillary adenopathy aggressive osseous lesion.  IMPRESSION: Tiny subpleural nodule in left lower lobe is likely benign.   Electronically Signed  By: Suzy Bouchard M.D.  On: 02/16/2015 08:18      Study Result     MEDICATIONS: None  CLINICAL DATA: Aortic Stenosis  EXAM: Cardiac TAVR CT  TECHNIQUE: The patient was scanned on a Philips 256 scanner. A 120 kV retrospective scan was triggered in the descending thoracic aorta at 111 HU's. Gantry rotation speed was 270 msecs and collimation was .9 mm. No beta blockade or nitro were given. The 3D data set was reconstructed in 5% intervals of the R-R cycle. Systolic and diastolic phases were analyzed on a dedicated work station using MPR, MIP and VRT modes. The patient received 80 cc of contrast.  FINDINGS: Aortic Valve: Trileaflet heavily calcified. Mild calcification of the base of the left cusp at the annulus. No significant sino tubular junction calcification.  Aorta: No aneurysm or significant plaque. Normal origin of great vessels Mild calcification of the inferior surface of the arch  Sinotubular Junction: 23.8 mm  Ascending Thoracic Aorta: 30 mm  Aortic Arch: 25 mm  Descending Thoracic Aorta: 22 mm  Sinus of Valsalva Measurements:  Non-coronary: 31 mm  Right -coronary: 30 mm  Left -coronary: 87mm  Coronary Artery Height above Annulus:  Left Main: 13.7 mm  Right  Coronary: 17 mm  Virtual Basal Annulus Measurements:  Maximum/Minimum Diameter: 22.2 mm x 29.9 mm  Area: 516 mm2  Coronary Arteries: Native vessels occluded. Patient LIMA to LAD/D1, Patent SVG to OM1, OM2 sequential Patent SVG to AM/PDA  Optimum Fluoroscopic Angle for Delivery: RAO 1 degree Cranial 0 degrees  IMPRESSION: 1) Calcified trileaflet aortic valve suitable for a 26 mm Sapien 3 valve Area 516 mm2  2) Suitable coronary height above annulus for delivery  3) Patient SVG to PDA, Sequential to OM's and LIMA to LAD  4) Normal ascending aortic root size with no contraindications in Arch for TAVR  5) Optimum delivery angle essentially straight AP  Jenkins Rouge  Electronically Signed: By: Jenkins Rouge M.D. On: 02/13/2015 12:22   CLINICAL DATA: 74 year old male with severe aortic stenosis. Preprocedural study prior to potential transcatheter aortic valve replacement (TAVR).  EXAM: CT ANGIOGRAPHY CHEST, ABDOMEN AND PELVIS  TECHNIQUE: Multidetector CT imaging through the chest, abdomen and pelvis was performed using the standard protocol during bolus administration of intravenous contrast. Multiplanar reconstructed images and MIPs were obtained and reviewed to evaluate the vascular anatomy.  CONTRAST: 69mL OMNIPAQUE IOHEXOL 350 MG/ML SOLN  COMPARISON: No priors.  FINDINGS: CTA CHEST FINDINGS  Mediastinum/Lymph Nodes: Heart size is mildly enlarged with left atrial dilatation. There is no significant pericardial fluid, thickening or pericardial calcification. There is atherosclerosis of the thoracic aorta, the great vessels of the mediastinum and the coronary arteries, including calcified atherosclerotic plaque  in the left main, left anterior descending, left circumflex and right coronary arteries. Status post median sternotomy for CABG, including LIMA to the LAD. Severely thickened and calcified aortic valve, compatible with the  reported clinical history of severe aortic stenosis. No pathologically enlarged mediastinal or hilar lymph nodes. Small hiatal hernia. Partially calcified 1.5 x 0.8 cm nodule in the posterior aspect of the right lobe of the thyroid gland. No axillary lymphadenopathy.  Lungs/Pleura: Small amount of dependent atelectasis in the lower lobes of the lungs bilaterally. No acute consolidative airspace disease. No pleural effusions. No suspicious appearing pulmonary nodules or masses.  Musculoskeletal/Soft Tissues: Median sternotomy wires. There are no aggressive appearing lytic or blastic lesions noted in the visualized portions of the skeleton.  CTA ABDOMEN AND PELVIS FINDINGS  Hepatobiliary: 4 mm low-attenuation lesion in segment 8 of the liver is too small to characterize, but is statistically likely a tiny cyst. No other suspicious hepatic lesions are noted. No intrahepatic biliary ductal dilatation. Common hepatic duct is slightly prominent measuring 11 mm, however, this is strongly favored to be related to post cholecystectomy physiology. Distal common bile duct is normal in caliber measuring only 6 mm. Surgical clips in the gallbladder fossa from prior cholecystectomy.  Pancreas: Unremarkable.  Spleen: Unremarkable.  Adrenals/Urinary Tract: Several low-attenuation lesions in the kidneys bilaterally, some of which are compatible with simple cysts, others are too small to definitively characterize (also favored to represent small cysts). The largest lesion measures 3.2 cm and is exophytic extending off the upper pole of the left kidney, and contains several tiny foci of calcification in the wall, compatible with a Bosniak class 2 cyst. No hydroureteronephrosis. Urinary bladder is normal in appearance. Bilateral adrenal glands are normal in appearance.  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 No pathologic dilatation of the  small bowel or colon.  Vascular/Lymphatic: Vascular findings and measurements pertinent to potential TAVR procedure, as detailed below. No evidence of aneurysm or dissection. No lymphadenopathy noted in the abdomen or pelvis.  Reproductive: Prostate gland and seminal vesicles are unremarkable in appearance.  Other: No significant volume of ascites. No pneumoperitoneum.  Musculoskeletal: There are no aggressive appearing lytic or blastic lesions noted in the visualized portions of the skeleton.  VASCULAR MEASUREMENTS PERTINENT TO TAVR:  AORTA:  Minimal Aortic Diameter - 14 x 14 mm  Severity of Aortic Calcification - Mild  RIGHT PELVIS:  Right Common Iliac Artery -  Minimal Diameter - 9.8 x 8.7 mm  Tortuosity - Mild  Calcification - Mild  Right External Iliac Artery -  Minimal Diameter - 8.1 x 8.3 mm  Tortuosity - Moderate  Calcification - None  Right Common Femoral Artery -  Minimal Diameter - 8.0 x 6.0 mm  Tortuosity - Mild  Calcification - Mild  LEFT PELVIS:  Left Common Iliac Artery -  Minimal Diameter - 8.1 x 9.0 mm  Tortuosity - Mild  Calcification - Mild  Left External Iliac Artery -  Minimal Diameter - 8.4 x 8.3 mm  Tortuosity - Moderate  Calcification - None  Left Common Femoral Artery -  Minimal Diameter - 7.3 x 8.7 mm  Tortuosity - Mild  Calcification - Mild  Review of the MIP images confirms the above findings.  IMPRESSION: 1. Vascular findings and measurements pertinent to potential TAVR procedure, as detailed above. This patient does appear to have suitable pelvic arterial access bilaterally. 2. 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 3. Small hiatal  hernia. 4. Partially calcified 1.5 x 0.8 cm nodule in the posterior aspect of the right lobe of the thyroid gland. Followup evaluation with nonemergent thyroid ultrasound is suggested. This follows  ACR consensus guidelines: Managing Incidental Thyroid Nodules Detected on Imaging: White Paper of the ACR Incidental Thyroid Findings Committee. J Am Coll Radiol 2015; 12:143-150. 5. Additional incidental findings, as above.   Electronically Signed  By: Vinnie Langton M.D.  On: 02/13/2015 16:47    Impression:  Patient has stage D severe symptomatic aortic stenosis. He presents with progressive symptoms of exertional shortness of breath and fatigue consistent with chronic diastolic congestive heart failure, [place] Heart Association functional class II. I have personally reviewed the patient's recent transthoracic echocardiogram and diagnostic cardiac catheterization, cardiac CT and CTA of the chest, abdomen, and pelvis. The patient now has critical aortic stenosis with heavy calcification and severe restriction of leaflet mobility involving all 3 leaflets of the aortic valve. Left ventricular systolic function remains stable with moderate chronic systolic dysfunction and an ejection fraction of 45-50%. Diagnostic cardiac catheterization demonstrates continued patency of most of the bypass graft from the patient's previous coronary artery bypass surgery in 1997. There is a high-grade stenosis in the third obtuse marginal branch of the left circumflex system beyond the insertion of the saphenous vein graft which can be treated medically. I agree that the risks associated with conventional surgical aortic valve replacement with or without redo coronary artery bypass grafting would be high because of the patient's advanced age, previous coronary artery bypass surgery, left ventricular systolic dysfunction, and relatively small physical stature resulting in a small aortic root. I think that TAVR would be the best treatment for him. His CT scans show that he is a candidate for a 26 mm Sapien S3 valve delivered by a transfemoral route from either side.   During the course of the patient's  preoperative work up they have been evaluated comprehensively by a multidisciplinary team of specialists coordinated through the [facility] in the [place] [facility].  They have been demonstrated to suffer from symptomatic severe aortic stenosis as noted above. The patient has been counseled extensively as to the relative risks and benefits of all options for the treatment of severe aortic stenosis including long term medical therapy, conventional surgery for aortic valve replacement, and transcatheter aortic valve replacement.  The patient has been independently evaluated by two cardiac surgeons including myself and Dr. Roxy Manns, and  they are felt to be at high risk for conventional surgical aortic valve replacement based upon a predicted risk of mortality using the Society of Thoracic Surgeons risk calculator of 8% and both of [place] feel that the patient would be a high risk candidate for conventional surgery .    Following the decision to proceed with transcatheter aortic valve replacement, a discussion has been held regarding what types of management strategies would be attempted intraoperatively in the event of life-threatening complications, including whether or not the patient would be considered a candidate for the use of cardiopulmonary bypass and/or conversion to open sternotomy for attempted surgical intervention.  The patient has been advised of a variety of complications that might develop including but not limited to risks of death, stroke, paravalvular leak, aortic dissection or other major vascular complications, aortic annulus rupture, device embolization, cardiac rupture or perforation, mitral regurgitation, acute myocardial infarction, arrhythmia, heart block or bradycardia requiring permanent pacemaker placement, congestive heart failure, respiratory failure, renal failure, pneumonia, infection, other late complications  related to structural valve  deterioration or migration, or other complications that might ultimately cause a temporary or permanent loss of functional independence or other long term morbidity.  The patient provides full informed consent for the procedure as described and all questions were answered.   Plan:  Transfemoral TAVR on 03/10/2015    Gaye Pollack, MD

## 2015-03-02 ENCOUNTER — Other Ambulatory Visit: Payer: Self-pay | Admitting: Cardiovascular Disease

## 2015-03-02 MED ORDER — AMLODIPINE BESYLATE 5 MG PO TABS: 5.0000 mg | ORAL_TABLET | Freq: Every day | ORAL | Status: DC

## 2015-03-05 ENCOUNTER — Encounter (HOSPITAL_COMMUNITY): Payer: Self-pay | Admitting: Pharmacy Technician

## 2015-03-06 ENCOUNTER — Ambulatory Visit (HOSPITAL_COMMUNITY)
Admission: RE | Admit: 2015-03-06 | Discharge: 2015-03-06 | Disposition: A | Discharge status: Home / Self Care | Payer: Medicare Other | Source: Ambulatory Visit | Attending: Cardiovascular Disease | Admitting: Cardiovascular Disease

## 2015-03-06 ENCOUNTER — Other Ambulatory Visit (HOSPITAL_COMMUNITY): Payer: Medicare Other

## 2015-03-06 ENCOUNTER — Encounter (HOSPITAL_COMMUNITY)
Admission: RE | Admit: 2015-03-06 | Discharge: 2015-03-06 | Disposition: A | Discharge status: Home / Self Care | Payer: Medicare Other | Source: Ambulatory Visit | Attending: Cardiovascular Disease | Admitting: Cardiovascular Disease

## 2015-03-06 ENCOUNTER — Encounter (HOSPITAL_COMMUNITY): Payer: Self-pay

## 2015-03-06 VITALS — BP 115/60 | HR 55 | Temp 98.4°F | Resp 20 | Ht 62.0 in | Wt 135.6 lb

## 2015-03-06 DIAGNOSIS — Z951 Presence of aortocoronary bypass graft: Secondary | ICD-10-CM | POA: Insufficient documentation

## 2015-03-06 DIAGNOSIS — I517 Cardiomegaly: Secondary | ICD-10-CM | POA: Diagnosis not present

## 2015-03-06 DIAGNOSIS — I350 Nonrheumatic aortic (valve) stenosis: Secondary | ICD-10-CM | POA: Diagnosis present

## 2015-03-06 DIAGNOSIS — I878 Other specified disorders of veins: Secondary | ICD-10-CM | POA: Diagnosis not present

## 2015-03-06 DIAGNOSIS — J811 Chronic pulmonary edema: Secondary | ICD-10-CM | POA: Diagnosis not present

## 2015-03-06 HISTORY — DX: Headache: R51

## 2015-03-06 HISTORY — DX: Headache, unspecified: R51.9

## 2015-03-06 HISTORY — DX: Cardiac arrhythmia, unspecified: I49.9

## 2015-03-06 LAB — URINALYSIS, ROUTINE W REFLEX MICROSCOPIC
Bilirubin Urine: NEGATIVE
Glucose, UA: NEGATIVE mg/dL
Hgb urine dipstick: NEGATIVE
KETONES UR: NEGATIVE mg/dL
LEUKOCYTES UA: NEGATIVE
Nitrite: NEGATIVE
Protein, ur: NEGATIVE mg/dL
Specific Gravity, Urine: 1.014 (ref 1.005–1.030)
Urobilinogen, UA: 1 mg/dL (ref 0.0–1.0)
pH: 7 (ref 5.0–8.0)

## 2015-03-06 LAB — COMPREHENSIVE METABOLIC PANEL
ALK PHOS: 114 U/L (ref 39–117)
ALT: 23 U/L (ref 0–53)
ANION GAP: 4 — AB (ref 5–15)
AST: 26 U/L (ref 0–37)
Albumin: 3.7 g/dL (ref 3.5–5.2)
BILIRUBIN TOTAL: 0.9 mg/dL (ref 0.3–1.2)
BUN: 11 mg/dL (ref 6–23)
CHLORIDE: 108 mmol/L (ref 96–112)
CO2: 25 mmol/L (ref 19–32)
Calcium: 8.6 mg/dL (ref 8.4–10.5)
Creatinine, Ser: 0.94 mg/dL (ref 0.50–1.35)
GFR, EST NON AFRICAN AMERICAN: 79 mL/min — AB (ref >90)
GLUCOSE: 100 mg/dL — AB (ref 70–99)
Potassium: 4.4 mmol/L (ref 3.5–5.1)
Sodium: 137 mmol/L (ref 135–145)
Total Protein: 6.5 g/dL (ref 6.0–8.3)

## 2015-03-06 LAB — BLOOD GAS, ARTERIAL
Acid-Base Excess: 1 mmol/L (ref 0.0–2.0)
Bicarbonate: 25 mEq/L — ABNORMAL HIGH (ref 20.0–24.0)
Drawn by: 42180
FIO2: 0.21 %
O2 Saturation: 97.7 %
PCO2 ART: 39.3 mmHg (ref 35.0–45.0)
Patient temperature: 98.6
TCO2: 26.2 mmol/L (ref 0–100)
pH, Arterial: 7.42 (ref 7.350–7.450)
pO2, Arterial: 91 mmHg (ref 80.0–100.0)

## 2015-03-06 LAB — CBC
HCT: 36.9 % — ABNORMAL LOW (ref 39.0–52.0)
Hemoglobin: 12.1 g/dL — ABNORMAL LOW (ref 13.0–17.0)
MCH: 31.8 pg (ref 26.0–34.0)
MCHC: 32.8 g/dL (ref 30.0–36.0)
MCV: 97.1 fL (ref 78.0–100.0)
Platelets: 132 10*3/uL — ABNORMAL LOW (ref 150–400)
RBC: 3.8 MIL/uL — ABNORMAL LOW (ref 4.22–5.81)
RDW: 13.7 % (ref 11.5–15.5)
WBC: 7 10*3/uL (ref 4.0–10.5)

## 2015-03-06 LAB — SURGICAL PCR SCREEN
MRSA, PCR: NEGATIVE
STAPHYLOCOCCUS AUREUS: NEGATIVE

## 2015-03-06 LAB — PROTIME-INR
INR: 1.45 (ref 0.00–1.49)
Prothrombin Time: 17.8 seconds — ABNORMAL HIGH (ref 11.6–15.2)

## 2015-03-06 LAB — ABO/RH: ABO/RH(D): O POS

## 2015-03-06 LAB — APTT: aPTT: 35 seconds (ref 24–37)

## 2015-03-06 MED ORDER — CHLORHEXIDINE GLUCONATE 4 % EX LIQD
60.0000 mL | Freq: Once | CUTANEOUS | Status: DC
Start: 2015-03-06 — End: 2015-03-07

## 2015-03-06 MED ORDER — CHLORHEXIDINE GLUCONATE 4 % EX LIQD: 60.0000 mL | Freq: Once | CUTANEOUS | Status: DC

## 2015-03-07 LAB — HEMOGLOBIN A1C
Hgb A1c MFr Bld: 6 % — ABNORMAL HIGH (ref 4.8–5.6)
Mean Plasma Glucose: 126 mg/dL

## 2015-03-09 MED ORDER — SODIUM CHLORIDE 0.9 % IV SOLN
INTRAVENOUS | Status: DC
  Filled 2015-03-09: qty 30

## 2015-03-09 MED ORDER — DEXMEDETOMIDINE HCL IN NACL 400 MCG/100ML IV SOLN
0.1000 ug/kg/h | INTRAVENOUS | Status: AC
  Administered 2015-03-10: 0.3 ug/kg/h via INTRAVENOUS
  Filled 2015-03-09: qty 100

## 2015-03-09 MED ORDER — SODIUM CHLORIDE 0.9 % IV SOLN: INTRAVENOUS | Status: DC

## 2015-03-09 MED ORDER — MAGNESIUM SULFATE 50 % IJ SOLN
40.0000 meq | INTRAMUSCULAR | Status: DC
  Filled 2015-03-09: qty 10

## 2015-03-09 MED ORDER — CEFUROXIME SODIUM 1.5 G IJ SOLR
1.5000 g | INTRAMUSCULAR | Status: AC
  Administered 2015-03-10: 1.5 g via INTRAVENOUS
  Filled 2015-03-09 (×2): qty 1.5

## 2015-03-09 MED ORDER — POTASSIUM CHLORIDE 2 MEQ/ML IV SOLN
80.0000 meq | INTRAVENOUS | Status: DC
  Filled 2015-03-09: qty 40

## 2015-03-09 MED ORDER — NITROGLYCERIN IN D5W 200-5 MCG/ML-% IV SOLN
2.0000 ug/min | INTRAVENOUS | Status: DC
  Filled 2015-03-09: qty 250

## 2015-03-09 MED ORDER — METOPROLOL TARTRATE 12.5 MG HALF TABLET: 12.5000 mg | ORAL_TABLET | Freq: Once | ORAL | Status: DC

## 2015-03-09 MED ORDER — NOREPINEPHRINE BITARTRATE 1 MG/ML IV SOLN
0.0000 ug/min | INTRAVENOUS | Status: DC
  Filled 2015-03-09: qty 4

## 2015-03-09 MED ORDER — EPINEPHRINE HCL 1 MG/ML IJ SOLN
0.0000 ug/min | INTRAVENOUS | Status: DC
  Filled 2015-03-09: qty 4

## 2015-03-09 MED ORDER — DOPAMINE-DEXTROSE 3.2-5 MG/ML-% IV SOLN
0.0000 ug/kg/min | INTRAVENOUS | Status: DC
  Filled 2015-03-09: qty 250

## 2015-03-09 MED ORDER — SODIUM CHLORIDE 0.9 % IV SOLN
INTRAVENOUS | Status: AC
  Administered 2015-03-10: 1.3 [IU]/h via INTRAVENOUS
  Filled 2015-03-09: qty 2.5

## 2015-03-09 MED ORDER — VANCOMYCIN HCL 10 G IV SOLR
1250.0000 mg | INTRAVENOUS | Status: AC
  Administered 2015-03-10: 1250 mg via INTRAVENOUS
  Filled 2015-03-09 (×2): qty 1250

## 2015-03-09 MED ORDER — DEXTROSE 5 % IV SOLN
30.0000 ug/min | INTRAVENOUS | Status: AC
  Administered 2015-03-10: 20 ug/min via INTRAVENOUS
  Filled 2015-03-09: qty 2

## 2015-03-09 MED ORDER — CHLORHEXIDINE GLUCONATE 4 % EX LIQD
30.0000 mL | CUTANEOUS | Status: DC
  Filled 2015-03-09: qty 30

## 2015-03-10 ENCOUNTER — Encounter (HOSPITAL_COMMUNITY): Payer: Self-pay | Admitting: *Deleted

## 2015-03-10 ENCOUNTER — Inpatient Hospital Stay (HOSPITAL_COMMUNITY): Payer: Medicare Other | Admitting: Anesthesiology

## 2015-03-10 ENCOUNTER — Inpatient Hospital Stay (HOSPITAL_COMMUNITY)
Admission: RE | Admit: 2015-03-10 | Discharge: 2015-03-12 | DRG: 266 | Disposition: A | Discharge status: Home / Self Care | Payer: Medicare Other | Source: Ambulatory Visit | Attending: Cardiovascular Disease | Admitting: Cardiovascular Disease

## 2015-03-10 ENCOUNTER — Ambulatory Visit: Payer: Medicare Other

## 2015-03-10 ENCOUNTER — Encounter (HOSPITAL_COMMUNITY)
Admission: RE | Disposition: A | Discharge status: Home / Self Care | Payer: Medicare Other | Source: Ambulatory Visit | Attending: Cardiovascular Disease

## 2015-03-10 ENCOUNTER — Inpatient Hospital Stay (HOSPITAL_COMMUNITY): Payer: Medicare Other

## 2015-03-10 DIAGNOSIS — I700 Atherosclerosis of aorta: Secondary | ICD-10-CM | POA: Diagnosis present

## 2015-03-10 DIAGNOSIS — I5043 Acute on chronic combined systolic (congestive) and diastolic (congestive) heart failure: Secondary | ICD-10-CM | POA: Diagnosis present

## 2015-03-10 DIAGNOSIS — Z7982 Long term (current) use of aspirin: Secondary | ICD-10-CM

## 2015-03-10 DIAGNOSIS — Z006 Encounter for examination for normal comparison and control in clinical research program: Secondary | ICD-10-CM | POA: Diagnosis not present

## 2015-03-10 DIAGNOSIS — Z7901 Long term (current) use of anticoagulants: Secondary | ICD-10-CM | POA: Diagnosis not present

## 2015-03-10 DIAGNOSIS — I482 Chronic atrial fibrillation, unspecified: Secondary | ICD-10-CM | POA: Diagnosis present

## 2015-03-10 DIAGNOSIS — I252 Old myocardial infarction: Secondary | ICD-10-CM | POA: Diagnosis not present

## 2015-03-10 DIAGNOSIS — I517 Cardiomegaly: Secondary | ICD-10-CM | POA: Diagnosis not present

## 2015-03-10 DIAGNOSIS — I350 Nonrheumatic aortic (valve) stenosis: Principal | ICD-10-CM

## 2015-03-10 DIAGNOSIS — Z954 Presence of other heart-valve replacement: Secondary | ICD-10-CM | POA: Diagnosis not present

## 2015-03-10 DIAGNOSIS — I4891 Unspecified atrial fibrillation: Secondary | ICD-10-CM | POA: Diagnosis not present

## 2015-03-10 DIAGNOSIS — I10 Essential (primary) hypertension: Secondary | ICD-10-CM | POA: Diagnosis not present

## 2015-03-10 DIAGNOSIS — I2510 Atherosclerotic heart disease of native coronary artery without angina pectoris: Secondary | ICD-10-CM | POA: Diagnosis not present

## 2015-03-10 DIAGNOSIS — Z951 Presence of aortocoronary bypass graft: Secondary | ICD-10-CM

## 2015-03-10 DIAGNOSIS — J9811 Atelectasis: Secondary | ICD-10-CM | POA: Diagnosis not present

## 2015-03-10 DIAGNOSIS — K219 Gastro-esophageal reflux disease without esophagitis: Secondary | ICD-10-CM | POA: Diagnosis not present

## 2015-03-10 DIAGNOSIS — E785 Hyperlipidemia, unspecified: Secondary | ICD-10-CM | POA: Diagnosis not present

## 2015-03-10 DIAGNOSIS — I480 Paroxysmal atrial fibrillation: Secondary | ICD-10-CM | POA: Diagnosis not present

## 2015-03-10 DIAGNOSIS — I255 Ischemic cardiomyopathy: Secondary | ICD-10-CM | POA: Diagnosis not present

## 2015-03-10 DIAGNOSIS — D62 Acute posthemorrhagic anemia: Secondary | ICD-10-CM | POA: Diagnosis not present

## 2015-03-10 DIAGNOSIS — D6959 Other secondary thrombocytopenia: Secondary | ICD-10-CM | POA: Diagnosis present

## 2015-03-10 DIAGNOSIS — Z452 Encounter for adjustment and management of vascular access device: Secondary | ICD-10-CM | POA: Diagnosis not present

## 2015-03-10 DIAGNOSIS — Z952 Presence of prosthetic heart valve: Secondary | ICD-10-CM

## 2015-03-10 HISTORY — PX: TRANSCATHETER AORTIC VALVE REPLACEMENT, TRANSFEMORAL: SHX6400

## 2015-03-10 HISTORY — DX: Presence of prosthetic heart valve: Z95.2

## 2015-03-10 HISTORY — PX: TEE WITHOUT CARDIOVERSION: SHX5443

## 2015-03-10 LAB — POCT I-STAT, CHEM 8
BUN: 10 mg/dL (ref 6–23)
BUN: 9 mg/dL (ref 6–23)
CALCIUM ION: 1.17 mmol/L (ref 1.13–1.30)
CHLORIDE: 104 mmol/L (ref 96–112)
CHLORIDE: 103 mmol/L (ref 96–112)
CREATININE: 0.6 mg/dL (ref 0.50–1.35)
Calcium, Ion: 1.18 mmol/L (ref 1.13–1.30)
Creatinine, Ser: 0.5 mg/dL (ref 0.50–1.35)
Glucose, Bld: 103 mg/dL — ABNORMAL HIGH (ref 70–99)
Glucose, Bld: 106 mg/dL — ABNORMAL HIGH (ref 70–99)
HCT: 32 % — ABNORMAL LOW (ref 39.0–52.0)
HCT: 31 % — ABNORMAL LOW (ref 39.0–52.0)
HEMOGLOBIN: 10.5 g/dL — AB (ref 13.0–17.0)
Hemoglobin: 10.9 g/dL — ABNORMAL LOW (ref 13.0–17.0)
POTASSIUM: 3.8 mmol/L (ref 3.5–5.1)
Potassium: 4 mmol/L (ref 3.5–5.1)
Sodium: 138 mmol/L (ref 135–145)
Sodium: 138 mmol/L (ref 135–145)
TCO2: 24 mmol/L (ref 0–100)
TCO2: 20 mmol/L (ref 0–100)

## 2015-03-10 LAB — GLUCOSE, CAPILLARY
GLUCOSE-CAPILLARY: 73 mg/dL (ref 70–99)
Glucose-Capillary: 75 mg/dL (ref 70–99)
Glucose-Capillary: 76 mg/dL (ref 70–99)
Glucose-Capillary: 84 mg/dL (ref 70–99)
Glucose-Capillary: 96 mg/dL (ref 70–99)

## 2015-03-10 LAB — POCT I-STAT 3, ART BLOOD GAS (G3+)
ACID-BASE DEFICIT: 1 mmol/L (ref 0.0–2.0)
Bicarbonate: 23.3 mEq/L (ref 20.0–24.0)
Bicarbonate: 24.3 mEq/L — ABNORMAL HIGH (ref 20.0–24.0)
O2 Saturation: 100 %
O2 Saturation: 96 %
PH ART: 7.456 — AB (ref 7.350–7.450)
PH ART: 7.374 (ref 7.350–7.450)
TCO2: 24 mmol/L (ref 0–100)
TCO2: 26 mmol/L (ref 0–100)
pCO2 arterial: 33 mmHg — ABNORMAL LOW (ref 35.0–45.0)
pCO2 arterial: 41.3 mmHg (ref 35.0–45.0)
pO2, Arterial: 228 mmHg — ABNORMAL HIGH (ref 80.0–100.0)
pO2, Arterial: 78 mmHg — ABNORMAL LOW (ref 80.0–100.0)

## 2015-03-10 LAB — PROTIME-INR
INR: 1.25 (ref 0.00–1.49)
PROTHROMBIN TIME: 15.9 s — AB (ref 11.6–15.2)

## 2015-03-10 LAB — POCT I-STAT 4, (NA,K, GLUC, HGB,HCT)
Glucose, Bld: 90 mg/dL (ref 70–99)
HCT: 32 % — ABNORMAL LOW (ref 39.0–52.0)
Hemoglobin: 10.9 g/dL — ABNORMAL LOW (ref 13.0–17.0)
POTASSIUM: 3.3 mmol/L — AB (ref 3.5–5.1)
SODIUM: 139 mmol/L (ref 135–145)

## 2015-03-10 LAB — PREPARE RBC (CROSSMATCH)

## 2015-03-10 LAB — CBC
HEMATOCRIT: 30.6 % — AB (ref 39.0–52.0)
HEMOGLOBIN: 10.3 g/dL — AB (ref 13.0–17.0)
MCH: 32.6 pg (ref 26.0–34.0)
MCHC: 33.7 g/dL (ref 30.0–36.0)
MCV: 96.8 fL (ref 78.0–100.0)
Platelets: 95 10*3/uL — ABNORMAL LOW (ref 150–400)
RBC: 3.16 MIL/uL — AB (ref 4.22–5.81)
RDW: 13.5 % (ref 11.5–15.5)
WBC: 10.2 10*3/uL (ref 4.0–10.5)

## 2015-03-10 LAB — APTT: aPTT: 30 seconds (ref 24–37)

## 2015-03-10 SURGERY — IMPLANTATION, AORTIC VALVE, TRANSCATHETER, FEMORAL APPROACH
Anesthesia: General | Site: Chest

## 2015-03-10 MED ORDER — DEXTROSE 5 % IV SOLN
10.0000 mg | INTRAVENOUS | Status: DC | PRN
  Administered 2015-03-10: 20 ug/min via INTRAVENOUS

## 2015-03-10 MED ORDER — SODIUM CHLORIDE 0.9 % IJ SOLN
3.0000 mL | Freq: Two times a day (BID) | INTRAMUSCULAR | Status: DC
  Administered 2015-03-10 – 2015-03-11 (×2): 3 mL via INTRAVENOUS

## 2015-03-10 MED ORDER — CEFUROXIME SODIUM 1.5 G IJ SOLR
1.5000 g | Freq: Two times a day (BID) | INTRAMUSCULAR | Status: DC
  Administered 2015-03-10 – 2015-03-11 (×3): 1.5 g via INTRAVENOUS
  Filled 2015-03-10 (×4): qty 1.5

## 2015-03-10 MED ORDER — NITROGLYCERIN IN D5W 200-5 MCG/ML-% IV SOLN: 0.0000 ug/min | INTRAVENOUS | Status: DC

## 2015-03-10 MED ORDER — ACETAMINOPHEN 500 MG PO TABS
1000.0000 mg | ORAL_TABLET | Freq: Four times a day (QID) | ORAL | Status: DC
  Administered 2015-03-10 – 2015-03-12 (×5): 1000 mg via ORAL
  Filled 2015-03-10 (×10): qty 2

## 2015-03-10 MED ORDER — LACTATED RINGERS IV SOLN: 500.0000 mL | Freq: Once | INTRAVENOUS | Status: AC | PRN

## 2015-03-10 MED ORDER — ACETAMINOPHEN 160 MG/5ML PO SOLN: 650.0000 mg | Freq: Once | ORAL | Status: DC

## 2015-03-10 MED ORDER — METOPROLOL TARTRATE 12.5 MG HALF TABLET
12.5000 mg | ORAL_TABLET | Freq: Two times a day (BID) | ORAL | Status: DC
  Administered 2015-03-10 – 2015-03-11 (×2): 12.5 mg via ORAL
  Filled 2015-03-10 (×5): qty 1

## 2015-03-10 MED ORDER — HEPARIN SODIUM (PORCINE) 5000 UNIT/ML IJ SOLN
INTRAMUSCULAR | Status: DC | PRN
  Administered 2015-03-10: 1500 mL

## 2015-03-10 MED ORDER — MIDAZOLAM HCL 2 MG/2ML IJ SOLN: 2.0000 mg | Freq: Once | INTRAMUSCULAR | Status: DC

## 2015-03-10 MED ORDER — AMIODARONE HCL 100 MG PO TABS
100.0000 mg | ORAL_TABLET | Freq: Every day | ORAL | Status: DC
  Administered 2015-03-11 – 2015-03-12 (×2): 100 mg via ORAL
  Filled 2015-03-10 (×2): qty 1

## 2015-03-10 MED ORDER — ALBUMIN HUMAN 5 % IV SOLN: 250.0000 mL | INTRAVENOUS | Status: DC | PRN

## 2015-03-10 MED ORDER — GLYCOPYRROLATE 0.2 MG/ML IJ SOLN
INTRAMUSCULAR | Status: DC | PRN
  Administered 2015-03-10: 0.4 mg via INTRAVENOUS

## 2015-03-10 MED ORDER — TRAMADOL HCL 50 MG PO TABS: 50.0000 mg | ORAL_TABLET | ORAL | Status: DC | PRN

## 2015-03-10 MED ORDER — MIDAZOLAM HCL 5 MG/5ML IJ SOLN
INTRAMUSCULAR | Status: DC | PRN
  Administered 2015-03-10 (×2): 1 mg via INTRAVENOUS

## 2015-03-10 MED ORDER — LACTATED RINGERS IV SOLN
INTRAVENOUS | Status: DC
  Administered 2015-03-10: 10:00:00 via INTRAVENOUS

## 2015-03-10 MED ORDER — VANCOMYCIN HCL IN DEXTROSE 1-5 GM/200ML-% IV SOLN
1000.0000 mg | Freq: Once | INTRAVENOUS | Status: AC
  Administered 2015-03-11: 1000 mg via INTRAVENOUS
  Filled 2015-03-10: qty 200

## 2015-03-10 MED ORDER — FAMOTIDINE IN NACL 20-0.9 MG/50ML-% IV SOLN: 20.0000 mg | Freq: Two times a day (BID) | INTRAVENOUS | Status: AC

## 2015-03-10 MED ORDER — PHENYLEPHRINE HCL 10 MG/ML IJ SOLN
0.0000 ug/min | INTRAMUSCULAR | Status: DC
  Filled 2015-03-10: qty 2

## 2015-03-10 MED ORDER — PROPOFOL 10 MG/ML IV BOLUS
INTRAVENOUS | Status: AC
  Filled 2015-03-10: qty 20

## 2015-03-10 MED ORDER — CLOPIDOGREL BISULFATE 75 MG PO TABS
75.0000 mg | ORAL_TABLET | Freq: Every day | ORAL | Status: DC
  Administered 2015-03-11: 75 mg via ORAL
  Filled 2015-03-10 (×2): qty 1

## 2015-03-10 MED ORDER — HEPARIN SODIUM (PORCINE) 1000 UNIT/ML IJ SOLN
INTRAMUSCULAR | Status: DC | PRN
  Administered 2015-03-10: 7000 [IU] via INTRAVENOUS

## 2015-03-10 MED ORDER — FENTANYL CITRATE 0.05 MG/ML IJ SOLN
100.0000 ug | Freq: Once | INTRAMUSCULAR | Status: AC
  Administered 2015-03-10: 100 ug via INTRAVENOUS

## 2015-03-10 MED ORDER — ONDANSETRON HCL 4 MG/2ML IJ SOLN
INTRAMUSCULAR | Status: DC | PRN
  Administered 2015-03-10: 4 mg via INTRAVENOUS

## 2015-03-10 MED ORDER — ROCURONIUM BROMIDE 50 MG/5ML IV SOLN
INTRAVENOUS | Status: AC
  Filled 2015-03-10: qty 2

## 2015-03-10 MED ORDER — ASPIRIN EC 325 MG PO TBEC
325.0000 mg | DELAYED_RELEASE_TABLET | Freq: Every day | ORAL | Status: DC
  Filled 2015-03-10: qty 1

## 2015-03-10 MED ORDER — MIDAZOLAM HCL 2 MG/2ML IJ SOLN
INTRAMUSCULAR | Status: AC
  Filled 2015-03-10: qty 2

## 2015-03-10 MED ORDER — INSULIN REGULAR BOLUS VIA INFUSION
0.0000 [IU] | Freq: Three times a day (TID) | INTRAVENOUS | Status: DC
  Filled 2015-03-10: qty 10

## 2015-03-10 MED ORDER — ONDANSETRON HCL 4 MG/2ML IJ SOLN: 4.0000 mg | Freq: Four times a day (QID) | INTRAMUSCULAR | Status: DC | PRN

## 2015-03-10 MED ORDER — DEXMEDETOMIDINE HCL IN NACL 200 MCG/50ML IV SOLN: 0.1000 ug/kg/h | INTRAVENOUS | Status: DC

## 2015-03-10 MED ORDER — ATORVASTATIN CALCIUM 40 MG PO TABS
40.0000 mg | ORAL_TABLET | Freq: Every day | ORAL | Status: DC
  Administered 2015-03-11 – 2015-03-12 (×2): 40 mg via ORAL
  Filled 2015-03-10 (×3): qty 1

## 2015-03-10 MED ORDER — FENTANYL CITRATE 0.05 MG/ML IJ SOLN
INTRAMUSCULAR | Status: AC
Start: 2015-03-10 — End: 2015-03-10
  Administered 2015-03-10: 100 ug via INTRAVENOUS
  Filled 2015-03-10: qty 2

## 2015-03-10 MED ORDER — PROTAMINE SULFATE 10 MG/ML IV SOLN
INTRAVENOUS | Status: DC | PRN
  Administered 2015-03-10: 40 mg via INTRAVENOUS
  Administered 2015-03-10: 30 mg via INTRAVENOUS
  Administered 2015-03-10: 20 mg via INTRAVENOUS

## 2015-03-10 MED ORDER — SODIUM CHLORIDE 0.9 % IJ SOLN: 3.0000 mL | INTRAMUSCULAR | Status: DC | PRN

## 2015-03-10 MED ORDER — SODIUM CHLORIDE 0.9 % IV SOLN
1.0000 mL/kg/h | INTRAVENOUS | Status: DC
  Administered 2015-03-10: 1 mL/kg/h via INTRAVENOUS

## 2015-03-10 MED ORDER — PROPOFOL 10 MG/ML IV BOLUS
INTRAVENOUS | Status: DC | PRN
  Administered 2015-03-10: 80 mg via INTRAVENOUS

## 2015-03-10 MED ORDER — IODIXANOL 320 MG/ML IV SOLN
INTRAVENOUS | Status: DC | PRN
  Administered 2015-03-10: 141.1 mL via INTRAVENOUS

## 2015-03-10 MED ORDER — FENTANYL CITRATE 0.05 MG/ML IJ SOLN
INTRAMUSCULAR | Status: AC
  Filled 2015-03-10: qty 5

## 2015-03-10 MED ORDER — ACETAMINOPHEN 650 MG RE SUPP
650.0000 mg | Freq: Once | RECTAL | Status: DC
Start: 2015-03-10 — End: 2015-03-12

## 2015-03-10 MED ORDER — MIDAZOLAM HCL 2 MG/2ML IJ SOLN
INTRAMUSCULAR | Status: AC
  Administered 2015-03-10: 1 mg
  Filled 2015-03-10: qty 2

## 2015-03-10 MED ORDER — 0.9 % SODIUM CHLORIDE (POUR BTL) OPTIME
TOPICAL | Status: DC | PRN
  Administered 2015-03-10: 4000 mL

## 2015-03-10 MED ORDER — MIDAZOLAM HCL 2 MG/2ML IJ SOLN: 2.0000 mg | INTRAMUSCULAR | Status: DC | PRN

## 2015-03-10 MED ORDER — GLYCOPYRROLATE 0.2 MG/ML IJ SOLN
INTRAMUSCULAR | Status: AC
  Filled 2015-03-10: qty 2

## 2015-03-10 MED ORDER — ASPIRIN 81 MG PO CHEW
324.0000 mg | CHEWABLE_TABLET | Freq: Every day | ORAL | Status: DC
  Administered 2015-03-11: 324 mg
  Filled 2015-03-10: qty 4

## 2015-03-10 MED ORDER — PANTOPRAZOLE SODIUM 40 MG PO TBEC
40.0000 mg | DELAYED_RELEASE_TABLET | Freq: Every day | ORAL | Status: DC
  Administered 2015-03-10 – 2015-03-12 (×3): 40 mg via ORAL
  Filled 2015-03-10 (×3): qty 1

## 2015-03-10 MED ORDER — METOPROLOL TARTRATE 25 MG/10 ML ORAL SUSPENSION
12.5000 mg | Freq: Two times a day (BID) | ORAL | Status: DC
  Filled 2015-03-10 (×5): qty 5

## 2015-03-10 MED ORDER — ARTIFICIAL TEARS OP OINT
TOPICAL_OINTMENT | OPHTHALMIC | Status: DC | PRN
  Administered 2015-03-10: 1 via OPHTHALMIC

## 2015-03-10 MED ORDER — ACETAMINOPHEN 160 MG/5ML PO SOLN
1000.0000 mg | Freq: Four times a day (QID) | ORAL | Status: DC
  Filled 2015-03-10: qty 40

## 2015-03-10 MED ORDER — LIDOCAINE HCL (CARDIAC) 20 MG/ML IV SOLN
INTRAVENOUS | Status: AC
  Filled 2015-03-10: qty 5

## 2015-03-10 MED ORDER — FENTANYL CITRATE 0.05 MG/ML IJ SOLN
INTRAMUSCULAR | Status: DC | PRN
  Administered 2015-03-10: 100 ug via INTRAVENOUS

## 2015-03-10 MED ORDER — NEOSTIGMINE METHYLSULFATE 10 MG/10ML IV SOLN
INTRAVENOUS | Status: AC
  Filled 2015-03-10: qty 1

## 2015-03-10 MED ORDER — OXYCODONE HCL 5 MG PO TABS
5.0000 mg | ORAL_TABLET | ORAL | Status: DC | PRN
  Administered 2015-03-12 (×2): 10 mg via ORAL
  Filled 2015-03-10 (×3): qty 2

## 2015-03-10 MED ORDER — ARTIFICIAL TEARS OP OINT
TOPICAL_OINTMENT | OPHTHALMIC | Status: AC
  Filled 2015-03-10: qty 3.5

## 2015-03-10 MED ORDER — HEPARIN SODIUM (PORCINE) 1000 UNIT/ML IJ SOLN
INTRAMUSCULAR | Status: AC
  Filled 2015-03-10: qty 1

## 2015-03-10 MED ORDER — POTASSIUM CHLORIDE 10 MEQ/50ML IV SOLN
10.0000 meq | INTRAVENOUS | Status: AC | PRN
  Administered 2015-03-10 (×3): 10 meq via INTRAVENOUS

## 2015-03-10 MED ORDER — ROCURONIUM BROMIDE 50 MG/5ML IV SOLN
INTRAVENOUS | Status: AC
  Filled 2015-03-10: qty 1

## 2015-03-10 MED ORDER — METOPROLOL TARTRATE 1 MG/ML IV SOLN: 2.5000 mg | INTRAVENOUS | Status: DC | PRN

## 2015-03-10 MED ORDER — ONDANSETRON HCL 4 MG/2ML IJ SOLN
INTRAMUSCULAR | Status: AC
  Filled 2015-03-10: qty 2

## 2015-03-10 MED ORDER — SODIUM CHLORIDE 0.9 % IV SOLN: 250.0000 mL | INTRAVENOUS | Status: DC | PRN

## 2015-03-10 MED ORDER — ROCURONIUM BROMIDE 100 MG/10ML IV SOLN
INTRAVENOUS | Status: DC | PRN
  Administered 2015-03-10: 50 mg via INTRAVENOUS
  Administered 2015-03-10: 10 mg via INTRAVENOUS

## 2015-03-10 MED ORDER — INSULIN ASPART 100 UNIT/ML ~~LOC~~ SOLN: 0.0000 [IU] | SUBCUTANEOUS | Status: DC

## 2015-03-10 MED ORDER — SODIUM CHLORIDE 0.9 % IV SOLN
INTRAVENOUS | Status: DC
  Filled 2015-03-10: qty 2.5

## 2015-03-10 MED ORDER — NEOSTIGMINE METHYLSULFATE 10 MG/10ML IV SOLN
INTRAVENOUS | Status: DC | PRN
Start: 2015-03-10 — End: 2015-03-10
  Administered 2015-03-10: 3 mg via INTRAVENOUS

## 2015-03-10 MED ORDER — FENTANYL CITRATE 0.05 MG/ML IJ SOLN
25.0000 ug | INTRAMUSCULAR | Status: DC | PRN
  Administered 2015-03-11: 50 ug via INTRAVENOUS
  Filled 2015-03-10: qty 2

## 2015-03-10 SURGICAL SUPPLY — 102 items
ADH SKN CLS APL DERMABOND .7 (GAUZE/BANDAGES/DRESSINGS) ×2
ATTRACTOMAT 16X20 MAGNETIC DRP (DRAPES) IMPLANT
BAG BANDED W/RUBBER/TAPE 36X54 (MISCELLANEOUS) ×4 IMPLANT
BAG DECANTER FOR FLEXI CONT (MISCELLANEOUS) IMPLANT
BAG EQP BAND 135X91 W/RBR TAPE (MISCELLANEOUS) ×2
BAG SNAP BAND KOVER 36X36 (MISCELLANEOUS) ×4 IMPLANT
BLADE 10 SAFETY STRL DISP (BLADE) ×4 IMPLANT
BLADE STERNUM SYSTEM 6 (BLADE) ×4 IMPLANT
BLADE SURG ROTATE 9660 (MISCELLANEOUS) IMPLANT
CABLE PACING FASLOC BIEGE (MISCELLANEOUS) IMPLANT
CABLE PACING FASLOC BLUE (MISCELLANEOUS) ×4 IMPLANT
CANISTER SUCTION 2500CC (MISCELLANEOUS) IMPLANT
CANNULA FEM VENOUS REMOTE 22FR (CANNULA) IMPLANT
CANNULA OPTISITE PERFUSION 16F (CANNULA) IMPLANT
CANNULA OPTISITE PERFUSION 18F (CANNULA) IMPLANT
CATH DIAG EXPO 6F AL2 (CATHETERS) ×2 IMPLANT
CATH DIAG EXPO 6F FR4 (CATHETERS) ×2 IMPLANT
CATH DIAG EXPO 6F VENT PIG 145 (CATHETERS) ×4 IMPLANT
CATH EXPO 5FR AL1 (CATHETERS) ×2 IMPLANT
CATH S G BIP PACING (SET/KITS/TRAYS/PACK) ×6 IMPLANT
CATH STRAIGHT 5FR 65CM (CATHETERS) ×2 IMPLANT
CLIP TI MEDIUM 24 (CLIP) ×4 IMPLANT
CLIP TI WIDE RED SMALL 24 (CLIP) ×4 IMPLANT
CONT SPEC 4OZ CLIKSEAL STRL BL (MISCELLANEOUS) ×4 IMPLANT
COVER DOME SNAP 22 D (MISCELLANEOUS) ×8 IMPLANT
COVER MAYO STAND STRL (DRAPES) ×4 IMPLANT
COVER TABLE BACK 60X90 (DRAPES) ×4 IMPLANT
CRADLE DONUT ADULT HEAD (MISCELLANEOUS) ×4 IMPLANT
DERMABOND ADVANCED (GAUZE/BANDAGES/DRESSINGS) ×2
DERMABOND ADVANCED .7 DNX12 (GAUZE/BANDAGES/DRESSINGS) ×2 IMPLANT
DRAPE INCISE IOBAN 66X45 STRL (DRAPES) IMPLANT
DRAPE SLUSH/WARMER DISC (DRAPES) ×4 IMPLANT
DRAPE TABLE COVER HEAVY DUTY (DRAPES) ×4 IMPLANT
DRSG TEGADERM 4X4.75 (GAUZE/BANDAGES/DRESSINGS) ×4 IMPLANT
ELECT REM PT RETURN 9FT ADLT (ELECTROSURGICAL) ×8
ELECTRODE REM PT RTRN 9FT ADLT (ELECTROSURGICAL) ×4 IMPLANT
FELDMAN AORTIC STENOSIS CATHETER ×2 IMPLANT
FELT TEFLON 6X6 (MISCELLANEOUS) ×4 IMPLANT
FEMORAL VENOUS CANN RAP (CANNULA) IMPLANT
GAUZE SPONGE 4X4 12PLY STRL (GAUZE/BANDAGES/DRESSINGS) ×4 IMPLANT
GLOVE BIO SURGEON STRL SZ 6.5 (GLOVE) ×2 IMPLANT
GLOVE BIO SURGEON STRL SZ7 (GLOVE) ×4 IMPLANT
GLOVE BIO SURGEONS STRL SZ 6.5 (GLOVE) ×2
GLOVE BIOGEL PI IND STRL 6.5 (GLOVE) IMPLANT
GLOVE BIOGEL PI INDICATOR 6.5 (GLOVE) ×8
GLOVE ECLIPSE 7.5 STRL STRAW (GLOVE) ×4 IMPLANT
GLOVE ECLIPSE 8.0 STRL XLNG CF (GLOVE) ×8 IMPLANT
GLOVE EUDERMIC 7 POWDERFREE (GLOVE) ×4 IMPLANT
GLOVE ORTHO TXT STRL SZ7.5 (GLOVE) ×4 IMPLANT
GOWN STRL REUS W/ TWL LRG LVL3 (GOWN DISPOSABLE) ×6 IMPLANT
GOWN STRL REUS W/ TWL XL LVL3 (GOWN DISPOSABLE) ×12 IMPLANT
GOWN STRL REUS W/TWL LRG LVL3 (GOWN DISPOSABLE) ×20
GOWN STRL REUS W/TWL XL LVL3 (GOWN DISPOSABLE) ×24
GUIDEWIRE SAF TJ AMPL .035X180 (WIRE) ×4 IMPLANT
GUIDEWIRE SAFE TJ AMPLATZ EXST (WIRE) ×2 IMPLANT
GUIDEWIRE STRAIGHT .035 260CM (WIRE) ×2 IMPLANT
INSERT FOGARTY 61MM (MISCELLANEOUS) ×4 IMPLANT
INSERT FOGARTY XLG (MISCELLANEOUS) IMPLANT
KIT BASIN OR (CUSTOM PROCEDURE TRAY) ×4 IMPLANT
KIT DILATOR VASC 18G NDL (KITS) IMPLANT
KIT HEART LEFT (KITS) ×2 IMPLANT
KIT ROOM TURNOVER OR (KITS) ×4 IMPLANT
KIT SUCTION CATH 14FR (SUCTIONS) ×8 IMPLANT
NDL PERC 18GX7CM (NEEDLE) ×2 IMPLANT
NEEDLE PERC 18GX7CM (NEEDLE) ×4 IMPLANT
NS IRRIG 1000ML POUR BTL (IV SOLUTION) ×12 IMPLANT
PACK AORTA (CUSTOM PROCEDURE TRAY) ×4 IMPLANT
PAD ARMBOARD 7.5X6 YLW CONV (MISCELLANEOUS) ×8 IMPLANT
PAD ELECT DEFIB RADIOL ZOLL (MISCELLANEOUS) ×4 IMPLANT
PATCH TACHOSII LRG 9.5X4.8 (VASCULAR PRODUCTS) IMPLANT
SHEATH PINNACLE 6F 10CM (SHEATH) ×6 IMPLANT
SPONGE GAUZE 4X4 12PLY STER LF (GAUZE/BANDAGES/DRESSINGS) ×2 IMPLANT
SPONGE LAP 4X18 X RAY DECT (DISPOSABLE) ×4 IMPLANT
STOPCOCK 4 WAY LG BORE MALE ST (IV SETS) ×2 IMPLANT
STOPCOCK MORSE 400PSI 3WAY (MISCELLANEOUS) ×8 IMPLANT
SUT ETHIBOND X763 2 0 SH 1 (SUTURE) ×4 IMPLANT
SUT GORETEX CV 4 TH 22 36 (SUTURE) ×4 IMPLANT
SUT GORETEX TH-18 36 INCH (SUTURE) ×8 IMPLANT
SUT MNCRL AB 3-0 PS2 18 (SUTURE) ×4 IMPLANT
SUT PROLENE 3 0 SH1 36 (SUTURE) IMPLANT
SUT PROLENE 4 0 RB 1 (SUTURE) ×4
SUT PROLENE 4-0 RB1 .5 CRCL 36 (SUTURE) ×2 IMPLANT
SUT PROLENE 5 0 C 1 36 (SUTURE) ×8 IMPLANT
SUT PROLENE 6 0 C 1 30 (SUTURE) ×8 IMPLANT
SUT SILK  1 MH (SUTURE) ×2
SUT SILK 1 MH (SUTURE) ×2 IMPLANT
SUT SILK 2 0 SH CR/8 (SUTURE) IMPLANT
SUT VIC AB 2-0 CTX 36 (SUTURE) IMPLANT
SUT VIC AB 3-0 SH 8-18 (SUTURE) ×8 IMPLANT
SYR 30ML LL (SYRINGE) ×10 IMPLANT
SYR 50ML LL SCALE MARK (SYRINGE) ×4 IMPLANT
TAPE CLOTH SURG 4X10 WHT LF (GAUZE/BANDAGES/DRESSINGS) ×2 IMPLANT
TOWEL OR 17X26 10 PK STRL BLUE (TOWEL DISPOSABLE) ×8 IMPLANT
TRANSDUCER W/STOPCOCK (MISCELLANEOUS) ×4 IMPLANT
TRAY FOLEY IC TEMP SENS 14FR (CATHETERS) ×4 IMPLANT
TUBE SUCT INTRACARD DLP 20F (MISCELLANEOUS) IMPLANT
TUBING ART PRESS 72  MALE/FEM (TUBING) ×2
TUBING ART PRESS 72 MALE/FEM (TUBING) IMPLANT
TUBING HIGH PRESSURE 120CM (CONNECTOR) ×4 IMPLANT
VALVE HEART TRANSCATH SZ3 26MM (Prosthesis & Implant Heart) ×2 IMPLANT
WIRE .035 3MM-J 145CM (WIRE) ×2 IMPLANT
WIRE AMPLATZ SS-J .035X180CM (WIRE) ×2 IMPLANT

## 2015-03-10 NOTE — Op Note (Signed)
CARDIOTHORACIC SURGERY OPERATIVE NOTE  Date of Procedure:  03/10/2015  Preoperative Diagnosis: Severe Aortic Stenosis   Postoperative Diagnosis: Same   Procedure:    Transcatheter Aortic Valve Replacement - Open Left Transfemoral Approach  Edwards Sapien 3 Transcatheter Heart Valve (size 26 mm, model # 9600TFX, serial # [id number])   Co-Surgeons:  Valentina Gu. Roxy Manns, MD and [name], MD  Assistants:   Gaye Pollack, MD and Lauree Chandler, MD  Anesthesiologist:  Roberts Gaudy, MD  Echocardiographer:  Ena Dawley, MD  Pre-operative Echo Findings:  Severe aortic stenosis  Moderate-severe global left ventricular systolic dysfunction  Post-operative Echo Findings:  Trivial paravalvular leak  Improved left ventricular systolic function      DETAILS OF THE OPERATIVE PROCEDURE  The majority of the procedure is documented separately in a procedure note by Dr. [name].   TRANSFEMORAL ACCESS:   A small incision is made in the left groin immediately over the common femoral artery. The subcutaneous tissues are divided with electrocautery and the anterior surface of the common femoral artery is identified. Sharp dissection is utilized to free up the artery proximally and distally and the vessel is encircled with a vessel loop.  A pair of CV-4 Gore-tex sutures are place as diamond-shaped purse-strings on the anterior surface of the femoral artery.  The patient is heparinized systemically and ACT verified > 250 seconds.  The common femoral artery is punctured using an 18 gauge needle and a soft J-tipped guidewire is passed into the common iliac artery under fluoroscopic guidance.  A 6 Fr straight diagnostic catheter is placed over the guidewire and the guidewire is removed.  An Amplatz super stiff guidewire is passed through the sheath into the descending thoracic aorta and the introducing diagnostic catheter is removed.  Serial dilators are passed over the guidewire under  continuous fluoroscopic guidance, making certain that each dilator passes easily all of the way into the distal abdominal aorta.  A 14 Fr Commander introducer sheath is passed over the guidewire into the abdominal aorta.  The introducing dilator is removed, the sheath is flushed with heparinized saline, and the sheath is secured to the skin.    FEMORAL SHEATH REMOVAL AND ARTERIAL CLOSURE:  After the completion of successful valve deployment as documented separately by Dr. [name], the femoral artery sheath is removed and the arteriotomy is closed using the previously placed Gore-tex purse-string sutures. Once the repair has been completed protamine was administered to reverse the anticoagulation. A digitally-subtracted arteriogram is obtained from just above the aortic bifurcation to below the arteriotomy to confirm the integrity of the vascular repair.  The incision is irrigated with saline solution and subsequently closed in multiple layers using absorbable suture.  The skin incision is closed using a subcuticular skin closure.     Valentina Gu. Roxy Manns MD 03/10/2015 2:26 PM

## 2015-03-10 NOTE — Interval H&P Note (Signed)
History and Physical Interval Note:  03/10/2015 11:07 AM  Jesus [name]  has presented today for surgery, with the diagnosis of SERVE AS   The various methods of treatment have been discussed with the patient and family. After consideration of risks, benefits and other options for treatment, the patient has consented to  Procedure(s): TRANSCATHETER AORTIC VALVE REPLACEMENT, TRANSFEMORAL (N/A) TRANSESOPHAGEAL ECHOCARDIOGRAM (TEE) (N/A) as a surgical intervention .  The patient's history has been reviewed, patient examined, no change in status, stable for surgery.  I have reviewed the patient's chart and labs.  Questions were answered to the patient's satisfaction.    Pt well-known to me. I've followed him for many years. Here for TAVR via TF approach (left) for tx of severe aortic stenosis. No change in symptoms since this evaluation. All questions answered.  [name]

## 2015-03-10 NOTE — Anesthesia Preprocedure Evaluation (Addendum)
Anesthesia Evaluation  Patient identified by MRN, date of birth, ID band Patient awake    Reviewed: Allergy & Precautions, NPO status , Patient's Chart, lab work & pertinent test results  Airway Mallampati: II  TM Distance: >3 FB Neck ROM: Full    Dental  (+) Teeth Intact, Dental Advisory Given   Pulmonary          Cardiovascular hypertension, Pt. on home beta blockers + CAD and + Past MI + dysrhythmias Atrial Fibrillation     Neuro/Psych  Headaches,    GI/Hepatic GERD-  ,  Endo/Other  Hyperthyroidism   Renal/GU      Musculoskeletal   Abdominal   Peds  Hematology   Anesthesia Other Findings   Reproductive/Obstetrics                           Anesthesia Physical Anesthesia Plan  ASA: III  Anesthesia Plan: General   Post-op Pain Management:    Induction: Intravenous  Airway Management Planned: Oral ETT  Additional Equipment: Arterial line, CVP, PA Cath and 3D TEE  Intra-op Plan:   Post-operative Plan: Extubation in OR and Possible Post-op intubation/ventilation  Informed Consent:   Plan Discussed with:   Anesthesia Plan Comments:         Anesthesia Quick Evaluation

## 2015-03-10 NOTE — Anesthesia Postprocedure Evaluation (Signed)
  Anesthesia Post-op Note  Patient: Jesus [name]  Procedure(s) Performed: Procedure(s): TRANSCATHETER AORTIC VALVE REPLACEMENT, TRANSFEMORAL (N/A) TRANSESOPHAGEAL ECHOCARDIOGRAM (TEE) (N/A)  Patient Location: SICU  Anesthesia Type:General  Level of Consciousness: awake, alert  and oriented  Airway and Oxygen Therapy: Patient Spontanous Breathing and Patient connected to nasal cannula oxygen  Post-op Pain: none  Post-op Assessment: Post-op Vital signs reviewed, Patient's Cardiovascular Status Stable, Respiratory Function Stable, Patent Airway, No signs of Nausea or vomiting and Pain level controlled  Post-op Vital Signs: stable  Last Vitals:  Filed Vitals:   03/10/15 1815  BP:   Pulse: 60  Temp: 36.4 C  Resp: 16    Complications: No apparent anesthesia complications

## 2015-03-10 NOTE — Progress Notes (Signed)
  Echocardiogram Echocardiogram Transesophageal has been performed.  Bobbye Charleston 03/10/2015, 1:31 PM

## 2015-03-10 NOTE — Op Note (Signed)
[facility]  TAVR OPERATIVE NOTE   Date of Procedure:  03/10/2015  Preoperative Diagnosis: Severe Aortic Stenosis   Postoperative Diagnosis: Same   Procedure:    Transcatheter Aortic Valve Replacement - Transfemoral Approach  Edwards Sapien 3 THV (size 26 mm, model # 9600TFX, serial # [id number])   Co-Surgeons:  Valentina Gu. Roxy Manns, MD and [name], MD  Assistants:   Gaye Pollack, MD and Lauree Chandler, MD  Anesthesiologist:  Roberts Gaudy, MD  Echocardiographer:  Ena Dawley, MD  Pre-operative Echo Findings:  Severe aortic stenosis  Moderate left ventricular systolic dysfunction, LVEF 35-40  Mild MR  Post-operative Echo Findings:  No paravalvular leak  Improved left ventricular systolic function from baseline  Mild MR  BRIEF CLINICAL NOTE AND INDICATIONS FOR SURGERY  Jesus [name] is a 74 year old male with long-standing history of coronary artery disease with ischemic cardiomyopathy s/p CABG x 8 in 1997 by Dr. [name]. His grafts included a left internal mammary artery sewn sequentially to the diagonal branch and to the left anterior descending coronary artery, saphenous vein graft sewn sequentially to the OM1, OM2, and OM3 branches of the left circumflex coronary artery, and saphenous vein graft sewn sequentially to the AM1, AM2, and posterior descending coronary artery. The patient did well for many years but subsequently developed aortic stenosis which slowly progressed on follow-up echocardiograms.  Over the past year the patient has developed gradual progression of exertional shortness of breath and fatigue.Recently he has developed some mild tightness across his chest associated with exertion. He denies any symptoms occurring at rest. He denies any PND, orthopnea, or lower extremity edema. Repeat transthoracic echocardiogram revealed further progression in the severity of aortic stenosis with peak velocity across aortic valve measured at 4.8  m/sec corresponding to estimated peak and mean transvalvular gradients of 104 and 57 mmHg, respectively. Left ventricular systolic function remains stable with ejection fraction estimated at 45-50%.The patient subsequently underwent left and right heart catheterization demonstrating severe coronary artery disease with 100% chronic occlusion of the native coronary vessels but continued patency of the majority of bypass grafts placed at the time of surgery in 1997. There was significant stenosis of the third obtuse marginal branch of left circumflex coronary artery beyond the insertion of the vein graft. Pulmonary artery pressures and cardiac output were normal. CT imaging studies demonstrated suitable anatomy for transfemoral TAVR. He presents now for his TAVR procedure.   During the course of the patient's preoperative work up they have been evaluated comprehensively by a multidisciplinary team of specialists coordinated through the [facility] in the [facility].  They have been demonstrated to suffer from symptomatic severe aortic stenosis as noted above. The patient has been counseled extensively as to the relative risks and benefits of all options for the treatment of severe aortic stenosis including long term medical therapy, conventional surgery for aortic valve replacement, and transcatheter aortic valve replacement.  The patient has been independently evaluated by two cardiac surgeons including Dr Roxy Manns and Dr. Cyndia Bent, and they are felt to be at high risk for conventional surgical aortic valve replacement based upon a predicted risk of mortality using the [employer] of [employer] risk calculator of 8.0%. Both surgeons indicated the patient would be a poor candidate for conventional surgery (predicted risk of mortality >15% and/or predicted risk of permanent morbidity >50%) because of comorbidities including redo sternotomy, CAD with residual distal  vessel disease, and LV dysfunction.   Based upon review  of all of the patient's preoperative diagnostic tests they are felt to be candidate for transcatheter aortic valve replacement using the transfemoral approach as an alternative to high risk conventional surgery.    Following the decision to proceed with transcatheter aortic valve replacement, a discussion has been held regarding what types of management strategies would be attempted intraoperatively in the event of life-threatening complications, including whether or not the patient would be considered a candidate for the use of cardiopulmonary bypass and/or conversion to open sternotomy for attempted surgical intervention.  The patient has been advised of a variety of complications that might develop peculiar to this approach including but not limited to risks of death, stroke, paravalvular leak, aortic dissection or other major vascular complications, aortic annulus rupture, device embolization, cardiac rupture or perforation, acute myocardial infarction, arrhythmia, heart block or bradycardia requiring permanent pacemaker placement, congestive heart failure, respiratory failure, renal failure, pneumonia, infection, other late complications related to structural valve deterioration or migration, or other complications that might ultimately cause a temporary or permanent loss of functional independence or other long term morbidity.  The patient provides full informed consent for the procedure as described and all questions were answered preoperatively.    DETAILS OF THE OPERATIVE PROCEDURE  PREPARATION:    The patient is brought to the operating room on the above mentioned date and central monitoring was established by the anesthesia team including placement of Swan-Ganz catheter and radial arterial line. The patient is placed in the supine position on the operating table.  Intravenous antibiotics are administered. General endotracheal anesthesia is  induced uneventfully. A Foley catheter is placed.  Baseline transesophageal echocardiogram was performed. The patient's chest, abdomen, both groins, and both lower extremities are prepared and draped in a sterile manner. A time out procedure is performed.   PERIPHERAL ACCESS:    Using the modified Seldinger technique, femoral arterial and venous access was obtained with placement of 6 Fr sheaths on the right side.  A pigtail diagnostic catheter was passed through the right femoral arterial sheath under fluoroscopic guidance into the aortic root.  A temporary transvenous pacemaker catheter was passed through the left femoral venous sheath under fluoroscopic guidance into the right ventricle.  The pacemaker was tested to ensure stable lead placement and pacemaker capture. Aortic root angiography was performed in order to determine the optimal angiographic angle for valve deployment.   TRANSFEMORAL ACCESS:   A left femoral arterial cutdown was performed by Dr Roxy Manns. Please see his separate operative note for details. The patient was heparinized systemically and ACT verified > 250 seconds.    A 14 Fr transfemoral E-sheath was introduced into the left femoral artery after progressively dilating over an Amplatz superstiff wire. The aortic valve was difficult to cross. Multiple catheters were attempted. A Feldman aortic valve catheter was used to direct a straight-tip exchange length wire across the native aortic valve into the left ventricle. This was exchanged out for a pigtail catheter and position was confirmed in the LV apex. Simultaneous LV and Ao pressures were recorded.  The pigtail catheter was then exchanged for an Amplatz Extra-stiff wire in the LV apex. At that point, BAV was performed using a 23 mm valvuloplasty balloon.  Once optimal position was achieved, BAV was done under rapid ventricular pacing at 180 bpm. The patient recovered well hemodynamically.   TRANSCATHETER HEART VALVE DEPLOYMENT:   An Edwards Sapien 3 THV (size 26 mm) was prepared and crimped per manufacturer's guidelines, and the proper orientation of the  valve is confirmed on the [employer] delivery system. The valve was advanced through the introducer sheath using normal technique until in an appropriate position in the abdominal aorta beyond the sheath tip. The balloon was then retracted and using the fine-tuning wheel was centered on the valve. The valve was then advanced across the aortic arch using appropriate flexion of the catheter. The valve was carefully positioned across the aortic valve annulus. The Commander catheter was retracted using normal technique. Once final position of the valve has been confirmed by angiographic assessment, the valve is deployed while temporarily holding ventilation and during rapid ventricular pacing to maintain systolic blood pressure < 50 mmHg and pulse pressure < 10 mmHg. The balloon inflation is held for >3 seconds after reaching full deployment volume. Once the balloon has fully deflated the balloon is retracted into the ascending aorta and valve function is assessed using TEE. There is felt to be no paravalvular leak and no central aortic insufficiency.  The patient's hemodynamic recovery following valve deployment is good.  The deployment balloon and guidewire are both removed. Echo demostrated acceptable post-procedural gradients, stable mitral valve function, and no AI.   PROCEDURE COMPLETION:  The sheath was then removed and arteriotomy repaired by Dr Roxy Manns. Please see his separate report for details. Distal abdominal aortography was performed to evaluate for any arterial injury related to the procedure. There was no evidence dissection, perforation, or other vascular injury in the abdominal aorta, iliac artery, or femoral artery.  Protamine was administered once femoral arterial repair was complete. The temporary pacemaker, pigtail catheters and femoral sheaths were removed with  manual pressure used for hemostasis.   The patient tolerated the procedure well and is transported to the surgical intensive care in stable condition. There were no immediate intraoperative complications. All sponge instrument and needle counts are verified correct at completion of the operation.   No blood products were administered during the operation.  The patient received a total of 141 mL of intravenous contrast during the procedure.  [name] MD 03/10/2015 5:19 PM

## 2015-03-10 NOTE — Anesthesia Postprocedure Evaluation (Signed)
  Anesthesia Post-op Note  Patient: Jesus [name]  Procedure(s) Performed: Procedure(s): TRANSCATHETER AORTIC VALVE REPLACEMENT, TRANSFEMORAL (N/A) TRANSESOPHAGEAL ECHOCARDIOGRAM (TEE) (N/A)  Patient Location: ICU  Anesthesia Type:General  Level of Consciousness: awake and oriented  Airway and Oxygen Therapy: Patient Spontanous Breathing and Patient connected to nasal cannula oxygen  Post-op Pain: none  Post-op Assessment: Post-op Vital signs reviewed, Patient's Cardiovascular Status Stable and Respiratory Function Stable  Post-op Vital Signs: Reviewed and stable  Last Vitals:  Filed Vitals:   03/10/15 1336  BP:   Pulse: 42  Temp:   Resp:     Complications: No apparent anesthesia complications

## 2015-03-10 NOTE — Anesthesia Procedure Notes (Addendum)
Procedure Name: Intubation Date/Time: 03/10/2015 12:25 PM Performed by: Sampson Si E Pre-anesthesia Checklist: Patient identified, Emergency Drugs available, Suction available, Patient being monitored and Timeout performed Patient Re-evaluated:Patient Re-evaluated prior to inductionOxygen Delivery Method: Circle system utilized Preoxygenation: Pre-oxygenation with 100% oxygen Intubation Type: IV induction Ventilation: Mask ventilation without difficulty Laryngoscope Size: Miller and 3 Grade View: Grade II Tube type: Oral Tube size: 8.0 mm Number of attempts: 2 Airway Equipment and Method: Stylet Placement Confirmation: ETT inserted through vocal cords under direct vision,  positive ETCO2 and breath sounds checked- equal and bilateral Secured at: 22 cm Tube secured with: Tape Dental Injury: Teeth and Oropharynx as per pre-operative assessment

## 2015-03-10 NOTE — H&P (View-Only) (Signed)
[name]    CARDIOTHORACIC SURGERY CONSULTATION REPORT  Referring Provider is [name], MD PCP is [name], MD  Chief Complaint  Patient presents with  . Aortic Stenosis    2ND TAVR EVAL    HPI:  Jesus [name] is a 74 year old male with long-standing history of coronary artery disease with ischemic cardiomyopathy s/p CABG x 8 in 1997 by Dr. [name]. His grafts included a left internal mammary artery sewn sequentially to the diagonal branch and to the left anterior descending coronary artery, saphenous vein graft sewn sequentially to the OM1, OM2, and OM3 branches of the left circumflex coronary artery, and saphenous vein graft sewn sequentially to the AM1, AM2, and posterior descending coronary artery. The patient did well for many years but subsequently developed aortic stenosis which slowly progressed on follow-up echocardiograms. For the last several years he has been followed by Dr. [name]. Over the past year the patient has developed gradual progression of exertional shortness of breath and fatigue.Recently he has developed some mild tightness across his chest associated with exertion. He denies any symptoms occurring at rest. He denies any PND, orthopnea, or lower extremity edema. He was seen in follow-up recently by Dr. [name], and repeat transthoracic echocardiogram revealed further progression in the severity of aortic stenosis with peak velocity across aortic valve measured at 4.8 m/sec corresponding to estimated peak and mean transvalvular gradients of 104 and 57 mmHg, respectively. Left ventricular systolic function remains stable with ejection fraction estimated at 45-50%.The patient subsequently underwent left and right heart catheterization demonstrating severe coronary artery disease with 100% chronic occlusion of the native coronary vessels but continued patency of the majority of bypass grafts placed at the  time of surgery in 1997. There was significant stenosis of the third obtuse marginal branch of left circumflex coronary artery beyond the insertion of the vein graft. Pulmonary artery pressures and cardiac output were normal.  The patient lives with his wife who has cholelithiasis and needs a laparoscopic cholecystectomy in the near future. 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   Past Medical History  Diagnosis Date  . Esophageal reflux   . Atrial fibrillation     holding sinus rhythm on Amiodarone  . Aortic stenosis     mild with a mean aortic valve gradient of 12 mmHg  . CAD (coronary artery disease) 1997    CABG  . Unspecified essential hypertension   . History of transesophageal echocardiography (TEE) for monitoring   . Cardiomyopathy   . Other and unspecified hyperlipidemia   . Skin lesions, generalized     facial which may represent actinic keratoses and possible photosensitivity from Amiodarone  . GERD (gastroesophageal reflux disease)   . History of colonoscopy   . S/P CABG (coronary artery bypass graft) 02/09/1996    LIMA to diag-LAD, SVG to OM1-OM2-OM3, SVG to [facility], open SVG harvest from right leg - Dr [name]  . Acute myocardial infarction of inferior wall 1980    Past Surgical History  Procedure Laterality Date  . Tonsillectomy    . Coronary artery bypass graft  02/09/1996    LIMA to diag-LAD, SVG to OM1-OM2-OM3, SVG to [facility]  . Cholecystectomy    . Cardioversion  11/18/2006    Dr. [name]  . Cataract extraction w/ intraocular lens implant  April '13  (Dr. [name])    left eye  only  . Left and right heart catheterization with coronary/graft angiogram N/A 01/26/2015    Procedure: LEFT AND RIGHT HEART CATHETERIZATION WITH [name];  Surgeon: [name], MD;  Location: [facility] CATH LAB;  Service: Cardiovascular;  Laterality: N/A;     Family History  Problem Relation Age of Onset  . Stroke Mother 80    Deceased  . Heart attack Father 2    Deceased  . Atrial fibrillation Brother   . Crohn's disease Mother     Deceased  . Heart disease Father   . Heart disease Paternal Uncle   . Prostate cancer Paternal Uncle   . Heart failure Father     Deceased    History   Social History  . Marital Status: Married    Spouse Name: N/A  . Number of Children: N/A  . Years of Education: N/A   Occupational History  . Not on file.   Social History Main Topics  . Smoking status: Never Smoker   . Smokeless tobacco: Never Used     Comment: Does not smoke.  [name] Alcohol Use: No  . Drug Use: No  . Sexual Activity: Not Currently   Other Topics Concern  . Not on file   Social History Narrative   HSG. [employer] 6 years. Married - '69 - 1 year/divorced. '76 - . No children. Work - mfg/textiles - Designer, multimedia; currently works doing maintenance. [employer] - they have discussed this. Provided packet august '13.                 Current Outpatient Prescriptions  Medication Sig Dispense Refill  . amiodarone (PACERONE) 200 MG tablet Take 0.5 tablets (100 mg total) by mouth daily. 30 tablet 2  . amLODipine (NORVASC) 5 MG tablet Take 1 tablet (5 mg total) by mouth daily. 30 tablet 3  . Ascorbic Acid (VITAMIN C) 1000 MG tablet Take 1,000 mg by mouth daily.      [name] aspirin 81 MG tablet Take 81 mg by mouth daily.      [name] atorvastatin (LIPITOR) 80 MG tablet Take 0.5 tablets (40 mg total) by mouth daily. 45 tablet 5  . loratadine (CLARITIN) 10 MG tablet Take 10 mg by mouth daily as needed.      [name] losartan (COZAAR) 100 MG tablet Take 1 tablet (100 mg total) by mouth daily. 30 tablet 5  . Multiple Vitamins-Minerals (MULTIVITAMIN,TX-MINERALS) tablet Take 1 tablet by mouth daily.      . pantoprazole (PROTONIX) 40 MG tablet Take 1 tablet (40 mg total) by mouth daily. 90 tablet 2  . triamcinolone cream (KENALOG) 0.5 % Apply 1  application topically 3 (three) times daily as needed.    . warfarin (COUMADIN) 5 MG tablet Take as directed by coumadin clinic. 30 tablet 3   No current facility-administered medications for this visit.    Allergies  Allergen Reactions  . Codeine Other (See Comments)    Pt does not remember      Review of Systems:            General:normal appetite, decreased energy, no weight gain, no weight loss, no fever Cardiac:+ chest pain with exertion, no chest pain at rest, + SOB with exertion, no resting SOB, no PND, no  orthopnea, no palpitations, + arrhythmia, + atrial fibrillation, no LE edema, no dizzy spells, no                                                            syncope Respiratory:+ exertional shortness of breath, no home oxygen, no productive cough, + recent dry cough, no                                                       bronchitis, no wheezing, no hemoptysis, no asthma, no pain with inspiration or cough, + sleep                                                         apnea, no CPAP at night ZO:[id number] difficulty swallowing, + reflux, no frequent heartburn, no hiatal hernia, no abdominal pain,                                                 no constipation, no diarrhea, no hematochezia, no hematemesis, no melena GU:no dysuria, no frequency, no urinary tract infection, no hematuria, no enlarged prostate, no kidney                                                   stones, no kidney disease Vascular:no pain suggestive of claudication, no pain in feet, no leg cramps, no varicose veins, no DVT, no                                                     non-healing foot ulcer Neuro:no stroke, no TIA's, no  seizures, no headaches, no temporary blindness one eye, no slurred                                                            speech, no peripheral neuropathy, no chronic pain, no instability of gait, no memory/cognitive                                                           dysfunction Musculoskeletal:+ arthritis, no joint swelling, no myalgias, no difficulty walking, normal mobility  Skin:no rash, no itching, no  skin infections, no pressure sores or ulcerations Psych:no anxiety, no depression, no nervousness, no unusual recent stress Eyes:no blurry vision, no floaters, no recent vision changes, + wears glasses or contacts ENT:+ hearing loss and wears hearing aids, no loose or painful teeth, no dentures, last saw dentist December 2015 Hematologic:+ easy bruising, no abnormal bleeding, no clotting disorder, no frequent epistaxis Endocrine:no diabetes, does not check CBG's at home            Physical Exam:   BP 121/64 mmHg  Pulse 57  Resp 16  Ht 5\' 2"  (1.575 m)  Wt 131 lb (59.421 kg)  BMI 23.95 kg/m2  SpO2 96%  General:             well-appearing white male in no distress  HEENT:  Unremarkable, NCAT, PERLA, EOMI, oropharynx clear  Neck:   no JVD, no bruits, no adenopathy or thyromegaly.  Chest:   clear to auscultation, symmetrical breath sounds, no wheezes, no rhonchi   CV:   RRR, grade III/VI crescendo/decrescendo murmur heard best at RUSB,  no diastolic murmur  Abdomen:  soft, non-tender, no masses or organomegaly  Extremities:  warm, well-perfused, pulses palpable in feet, no LE edema  Rectal/GU  Deferred  Neuro:   Grossly non-focal and symmetrical throughout  Skin:   Clean and dry, no rashes, no breakdown   Diagnostic  Tests:  Transthoracic Echocardiography  Patient:  Jesus [name], Jesus [name] MR #:    [id number] Study Date: 01/21/2015 Gender:   M Age:    57 Height:   157.5 cm Weight:   60.8 kg BSA:    1.64 m^2 Pt. Status: Room:  ATTENDING  Jesus Him, MD SONOGRAPHER Oletta Lamas, Will ORDERING   Alexander, Outpatient  cc:  ------------------------------------------------------------------- LV EF: 45% -  50%  ------------------------------------------------------------------- Indications:   (I35.0).  ------------------------------------------------------------------- History:  PMH: Paroxysmal atrial fibrillation. Acquired from the patient and from the patient&'s chart. Coronary artery disease. Aortic stenosis. Aortic valve disease. PMH:  Myocardial infarction.  ------------------------------------------------------------------- Study Conclusions  - Left ventricle: The cavity size was normal. Systolic function was mildly reduced. The estimated ejection fraction was in the range of 45% to 50%. There is akinesis of the midinferior myocardium. Doppler parameters are consistent with elevated ventricular end-diastolic filling pressure. - Aortic valve: Severe thickening and calcification. Valve mobility was severely restricted and the right coronary cusp appears fixed. There was critical stenosis. There was trivial regurgitation. - Mitral valve: There was mild regurgitation. - Left atrium: The atrium was severely dilated. - Tricuspid valve: There was mild regurgitation.  ------------------------------------------------------------------- Labs, prior tests, procedures, and surgery: Coronary artery bypass grafting.  Transthoracic echocardiography. M-mode, complete 2D, spectral Doppler, and color Doppler. Birthdate: Patient birthdate: Aug 19, 1941. Age: Patient is 74 yr old. Sex: Gender:  male. BMI: 24.5 kg/m^2. Blood pressure:   143/70 Patient status: Outpatient. Study date: Study date: 01/21/2015. Study time: 08:31 AM. Location: [facility]  -------------------------------------------------------------------  ------------------------------------------------------------------- Left ventricle: The cavity size was normal. Systolic function was mildly reduced. The estimated ejection fraction was in the range of 45% to 50%. Regional wall motion abnormalities:  There is akinesis of the midinferior myocardium. Doppler parameters are consistent with elevated ventricular end-diastolic filling pressure.  ------------------------------------------------------------------- Aortic valve:  Trileaflet. Severe thickening and calcification. Valve mobility was severely restricted and the right coronary cusp appears fixed. Doppler:  There was critical stenosis.  There was trivial regurgitation.  VTI ratio of LVOT to aortic valve: 0.12. Valve  area (VTI): 0.38 cm^2. Indexed valve area (VTI): 0.23 cm^2/m^2. Valve area (Vmax): 0.4 cm^2. Indexed valve area (Vmax): 0.24 cm^2/m^2. Mean velocity ratio of LVOT to aortic valve: 0.12. Valve area (Vmean): 0.39 cm^2. Indexed valve area (Vmean): 0.24 cm^2/m^2.  Mean gradient (S): 57 mm Hg. Peak gradient (S): 104 mm Hg.  ------------------------------------------------------------------- Aorta: Aortic root: The aortic root was normal in size.  ------------------------------------------------------------------- Mitral valve:  Mildly thickened leaflets . Mobility was not restricted. Doppler: Transvalvular velocity was within the normal range. There was no evidence for stenosis. There was mild regurgitation.  Peak gradient (D): 4 mm Hg.  ------------------------------------------------------------------- Left atrium: The atrium was severely  dilated.  ------------------------------------------------------------------- Right ventricle: The cavity size was normal. Wall thickness was normal. Systolic function was normal.  ------------------------------------------------------------------- Pulmonic valve:  Structurally normal valve.  Cusp separation was normal. Doppler: Transvalvular velocity was within the normal range. There was no evidence for stenosis. There was no regurgitation.  ------------------------------------------------------------------- Tricuspid valve:  Structurally normal valve.  Doppler: Transvalvular velocity was within the normal range. There was mild regurgitation.  ------------------------------------------------------------------- Pulmonary artery:  The main pulmonary artery was normal-sized. Systolic pressure was within the normal range.  ------------------------------------------------------------------- Right atrium: The atrium was normal in size.  ------------------------------------------------------------------- Pericardium: There was no pericardial effusion.  ------------------------------------------------------------------- Systemic veins: Inferior vena cava: The vessel was normal in size.  ------------------------------------------------------------------- Measurements  Left ventricle              Value     Reference LV ID, ED, PLAX chordal      (H)   55.3 mm    43 - 52 LV ID, ES, PLAX chordal      (H)   44.3 mm    23 - 38 LV fx shortening, PLAX chordal  (L)   20  %    >=29 LV PW thickness, ED            8.77 mm    --------- IVS/LV PW ratio, ED            1.28      <=1.3 Stroke volume, 2D             51  ml    --------- Stroke volume/bsa, 2D           31  ml/m^2  --------- LV ejection fraction, 1-p A4C       44  %    --------- LV  end-diastolic volume, 2-p       147  ml    --------- LV end-systolic volume, 2-p        82  ml    --------- LV ejection fraction, 2-p         44  %    --------- Stroke volume, 2-p            65  ml    --------- LV end-diastolic volume/bsa, 2-p     90  ml/m^2  --------- LV end-systolic volume/bsa, 2-p      50  ml/m^2  --------- Stroke volume/bsa, 2-p          39.6 ml/m^2  --------- LV e&', lateral              7.21 cm/s   --------- LV E/e&', lateral             13.41     --------- LV e&', medial               4.78 cm/s   --------- LV  E/e&', medial              20.23     --------- LV e&', average              6   cm/s   --------- LV E/e&', average             16.13     ---------  Ventricular septum            Value     Reference IVS thickness, ED             11.2 mm    ---------  LVOT                   Value     Reference LVOT ID, S                20  mm    --------- LVOT area                 3.14 cm^2   --------- LVOT ID                  20  mm    --------- LVOT peak velocity, S           64.7 cm/s   --------- LVOT mean velocity, S           43.5 cm/s   --------- LVOT VTI, S                16.1 cm    --------- LVOT peak gradient, S           2   mm Hg  --------- Stroke volume (SV), LVOT DP        50.6 ml    --------- Stroke index (SV/bsa), LVOT DP      30.8 ml/m^2  ---------  Aortic valve               Value     Reference Aortic valve mean velocity, S       351  cm/s   --------- Aortic valve VTI, S            132  cm     --------- Aortic mean gradient, S          57  mm Hg  --------- Aortic peak gradient, S          104  mm Hg  --------- VTI ratio, LVOT/AV            0.12      --------- Aortic valve area, VTI          0.38 cm^2   --------- Aortic valve area/bsa, VTI        0.23 cm^2/m^2 --------- Aortic valve area, peak velocity     0.4  cm^2   --------- Aortic valve area/bsa, peak        0.24 cm^2/m^2 --------- velocity Velocity ratio, mean, LVOT/AV       0.12      --------- Aortic valve area, mean velocity     0.39 cm^2   --------- Aortic valve area/bsa, mean        0.24 cm^2/m^2 --------- velocity Aortic regurg pressure half-time     464  ms    ---------  Aorta                   Value     Reference Aortic root ID, ED  34  mm    --------- Ascending aorta ID, A-P, S        33  mm    ---------  Left atrium                Value     Reference LA ID, A-P, ES              46  mm    --------- LA ID/bsa, A-P          (H)   2.8  cm/m^2  <=2.2 LA volume, S               95  ml    --------- LA volume/bsa, S             57.9 ml/m^2  --------- LA volume, ES, 1-p A4C          102  ml    --------- LA volume/bsa, ES, 1-p A4C        62.1 ml/m^2  --------- LA volume, ES, 1-p A2C          86  ml    --------- LA volume/bsa, ES, 1-p A2C        52.4 ml/m^2  ---------  Mitral valve               Value     Reference Mitral E-wave peak velocity        96.7 cm/s   --------- Mitral A-wave peak velocity        87.4 cm/s   --------- Mitral deceleration time     (H)   408  ms    150 - 230 Mitral peak gradient, D          4    mm Hg  --------- Mitral E/A ratio, peak          1.1      ---------  Pulmonary arteries            Value     Reference PA pressure, S, DP            24  mm Hg  <=30  Tricuspid valve              Value     Reference Tricuspid regurg peak velocity      227  cm/s   --------- Tricuspid peak RV-RA gradient       21  mm Hg  ---------  Systemic veins              Value     Reference Estimated CVP               3   mm Hg  ---------  Right ventricle              Value     Reference RV pressure, S, DP            24  mm Hg  <=30 RV s&', lateral, S             10  cm/s   ---------  Legend: (L) and (H) mark values outside specified reference range.  ------------------------------------------------------------------- Prepared and Electronically Authenticated by  Jesus Him, MD 2016-02-17T13:09:03      Cardiac Catheterization Procedure Note  Name: LADANIAN KELTER MRN: [id number] DOB: Mar 14, 1941  Procedure: Right Heart Cath, catheter placement for angiography, Selective Coronary Angiography, LIMA angiography, saphenous vein graft angiography, aortic root angiography  Indication: Severe symptomatic aortic stenosis. This 74 year old  gentleman is status post remote multivessel CABG. He has developed progressive and now critical aortic stenosis. He presents today for preoperative cardiac catheterization  Procedural Details: There was an indwelling IV in a left antecubital vein. Using normal sterile technique, the IV was changed out for a 5 Fr brachial sheath over a 0.018 inch wire. The left wrist was then prepped, draped, and anesthetized with 1% lidocaine. Using the modified Seldinger technique a 5/6 French Slender sheath was placed in the left radial artery. Intra-arterial verapamil was administered  through the radial artery sheath. IV heparin was administered after a JR4 catheter was advanced into the central aorta. A Swan-Ganz catheter was used for the right heart catheterization. Standard protocol was followed for recording of right heart pressures and sampling of oxygen saturations. Fick cardiac output was calculated. Standard Judkins catheters were used for selective coronary angiography. A JR4 catheter was used for LIMA angiography. An AL 2 catheter was used for vein graft angiography. A pigtail catheter was used at the completion of the procedure to perform aortic root angiography. There were no immediate procedural complications. The patient was transferred to the post catheterization recovery area for further monitoring.  Procedural Findings: Hemodynamics RA 3 RV 29 over 4 PA 29/6 with a mean of 14 PCWP A wave 9, V wave 8, mean of 6 LV not recorded AO 108/49 with a mean of 71  Oxygen saturations: PA 69 AO 93  Cardiac Output (Fick) 4.95  Cardiac Index (Fick) 3.1  Coronary angiography: Coronary dominance: right  Left mainstem: Severely calcified with 99% stenosis and TIMI 2 flow  Left anterior descending (LAD): 100% proximal occlusion with severe calcification  Left circumflex (LCx): 100% occlusion with severe calcification  Right coronary artery (RCA): 100% occlusion with moderate proximal calcification  LIMA to LAD: The graft is widely patent. The distal anastomosis occurs at the LAD/diagonal bifurcation and both native vessels fill briskly.  Saphenous vein graft sequential to OM1, OM 2, and OM 3: Patent graft with diffuse irregularities. The first and second OM branches are small in caliber, but patent. The third OM is large in caliber. The AV circumflex is severely calcified and it fills retrograde from the graft. The distal portion of the third OM graft has an eccentric 80% stenosis.  Saphenous vein graft sequential to the acute marginals and PDA is  patent. The graft is fairly smooth throughout. The PDA anastomosis is widely patent. The acute marginals are patent and small in caliber. There is a septal cascade of the LAD that fills from collaterals from the PDA through septal branches.  Aortic root angiography: The aortic valve is severely thickened, calcified, and restricted. There is trivial aortic insufficiency. Visualized portions of the proximal ascending aorta appear to be normal in caliber.  Left ventriculography: Deferred (aortic valve not crossed)  Estimated Blood Loss: minimal  Final Conclusions:  1. Severe native three-vessel coronary artery disease with heavily calcified and totally occluded coronary arteries 2. Status post aortocoronary bypass surgery with continued patency of the LIMA to LAD, saphenous vein graft sequential to OM branches, and saphenous vein graft sequential to RCA branches. 3. Severely calcified aortic valve with known critical aortic stenosis 4. Well-preserved intra-coronary hemodynamics as measured at rest 5. Severe stenosis of the distal OM 3 branch after the insertion of the saphenous vein graft.  Recommendations: Further evaluation by Dr Roxy Manns for aortic valve replacement (redo conventional AVR versus TAVR).  [name] MD, [facility] 01/26/2015, 10:23 AM     STS Risk Calculator  ProcedureRedo CABG + AVR  Risk of Mortality8.0% Morbidity or Mortality34.0% Prolonged LOS19.0% Short LOS16.8% Permanent Stroke3.1% Prolonged Vent Support24.9% DSW Infection0.4% Renal Failure8.4% Reoperation15.0%        ADDENDUM REPORT: 02/16/2015 08:18  EXAM: OVER-READ  INTERPRETATION CT CHEST  The following report is an over-read performed by radiologist Dr. Alvino Blood [facility] on 02/16/2015. This over-read does not include interpretation of cardiac or coronary anatomy or pathology. The cardiac CTA interpretation by the cardiologist is attached.  COMPARISON: CT 02/13/2015  FINDINGS: Mild basilar atelectasis. Suspicious pulmonary nodules. Tiny subpleural nodule in the left lower lobe measures 3 mm (image 45 series 414) is likely benign. No axillary adenopathy aggressive osseous lesion.  IMPRESSION: Tiny subpleural nodule in left lower lobe is likely benign.   Electronically Signed  By: Suzy Bouchard M.D.  On: 02/16/2015 08:18      Study Result     MEDICATIONS: None  CLINICAL DATA: Aortic Stenosis  EXAM: Cardiac TAVR CT  TECHNIQUE: The patient was scanned on a Philips 256 scanner. A 120 kV retrospective scan was triggered in the descending thoracic aorta at 111 HU's. Gantry rotation speed was 270 msecs and collimation was .9 mm. No beta blockade or nitro were given. The 3D data set was reconstructed in 5% intervals of the R-R cycle. Systolic and diastolic phases were analyzed on a dedicated work station using MPR, MIP and VRT modes. The patient received 80 cc of contrast.  FINDINGS: Aortic Valve: Trileaflet heavily calcified. Mild calcification of the base of the left cusp at the annulus. No significant sino tubular junction calcification.  Aorta: No aneurysm or significant plaque. Normal origin of great vessels Mild calcification of the inferior surface of the arch  Sinotubular Junction: 23.8 mm  Ascending Thoracic Aorta: 30 mm  Aortic Arch: 25 mm  Descending Thoracic Aorta: 22 mm  Sinus of Valsalva Measurements:  Non-coronary: 31 mm  Right -coronary: 30 mm  Left -coronary: 20mm  Coronary Artery Height above Annulus:  Left Main: 13.7 mm  Right  Coronary: 17 mm  Virtual Basal Annulus Measurements:  Maximum/Minimum Diameter: 22.2 mm x 29.9 mm  Area: 516 mm2  Coronary Arteries: Native vessels occluded. Patient LIMA to LAD/D1, Patent SVG to OM1, OM2 sequential Patent SVG to AM/PDA  Optimum Fluoroscopic Angle for Delivery: RAO 1 degree Cranial 0 degrees  IMPRESSION: 1) Calcified trileaflet aortic valve suitable for a 26 mm Sapien 3 valve Area 516 mm2  2) Suitable coronary height above annulus for delivery  3) Patient SVG to PDA, Sequential to OM's and LIMA to LAD  4) Normal ascending aortic root size with no contraindications in Arch for TAVR  5) Optimum delivery angle essentially straight AP  Jenkins Rouge  Electronically Signed: By: Jenkins Rouge M.D. On: 02/13/2015 12:22   CLINICAL DATA: 74 year old male with severe aortic stenosis. Preprocedural study prior to potential transcatheter aortic valve replacement (TAVR).  EXAM: CT ANGIOGRAPHY CHEST, ABDOMEN AND PELVIS  TECHNIQUE: Multidetector CT imaging through the chest, abdomen and pelvis was performed using the standard protocol during bolus administration of intravenous contrast. Multiplanar reconstructed images and MIPs were obtained and reviewed to evaluate the vascular anatomy.  CONTRAST: 79mL OMNIPAQUE IOHEXOL 350 MG/ML SOLN  COMPARISON: No priors.  FINDINGS: CTA CHEST FINDINGS  Mediastinum/Lymph Nodes: Heart size is mildly enlarged with left atrial dilatation. There is no significant pericardial fluid, thickening or pericardial calcification. There is atherosclerosis of the thoracic aorta, the great vessels of the mediastinum and the coronary arteries, including calcified atherosclerotic plaque  in the left main, left anterior descending, left circumflex and right coronary arteries. Status post median sternotomy for CABG, including LIMA to the LAD. Severely thickened and calcified aortic valve, compatible with the  reported clinical history of severe aortic stenosis. No pathologically enlarged mediastinal or hilar lymph nodes. Small hiatal hernia. Partially calcified 1.5 x 0.8 cm nodule in the posterior aspect of the right lobe of the thyroid gland. No axillary lymphadenopathy.  Lungs/Pleura: Small amount of dependent atelectasis in the lower lobes of the lungs bilaterally. No acute consolidative airspace disease. No pleural effusions. No suspicious appearing pulmonary nodules or masses.  Musculoskeletal/Soft Tissues: Median sternotomy wires. There are no aggressive appearing lytic or blastic lesions noted in the visualized portions of the skeleton.  CTA ABDOMEN AND PELVIS FINDINGS  Hepatobiliary: 4 mm low-attenuation lesion in segment 8 of the liver is too small to characterize, but is statistically likely a tiny cyst. No other suspicious hepatic lesions are noted. No intrahepatic biliary ductal dilatation. Common hepatic duct is slightly prominent measuring 11 mm, however, this is strongly favored to be related to post cholecystectomy physiology. Distal common bile duct is normal in caliber measuring only 6 mm. Surgical clips in the gallbladder fossa from prior cholecystectomy.  Pancreas: Unremarkable.  Spleen: Unremarkable.  Adrenals/Urinary Tract: Several low-attenuation lesions in the kidneys bilaterally, some of which are compatible with simple cysts, others are too small to definitively characterize (also favored to represent small cysts). The largest lesion measures 3.2 cm and is exophytic extending off the upper pole of the left kidney, and contains several tiny foci of calcification in the wall, compatible with a Bosniak class 2 cyst. No hydroureteronephrosis. Urinary bladder is normal in appearance. Bilateral adrenal glands are normal in appearance.  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 No pathologic dilatation of the  small bowel or colon.  Vascular/Lymphatic: Vascular findings and measurements pertinent to potential TAVR procedure, as detailed below. No evidence of aneurysm or dissection. No lymphadenopathy noted in the abdomen or pelvis.  Reproductive: Prostate gland and seminal vesicles are unremarkable in appearance.  Other: No significant volume of ascites. No pneumoperitoneum.  Musculoskeletal: There are no aggressive appearing lytic or blastic lesions noted in the visualized portions of the skeleton.  VASCULAR MEASUREMENTS PERTINENT TO TAVR:  AORTA:  Minimal Aortic Diameter - 14 x 14 mm  Severity of Aortic Calcification - Mild  RIGHT PELVIS:  Right Common Iliac Artery -  Minimal Diameter - 9.8 x 8.7 mm  Tortuosity - Mild  Calcification - Mild  Right External Iliac Artery -  Minimal Diameter - 8.1 x 8.3 mm  Tortuosity - Moderate  Calcification - None  Right Common Femoral Artery -  Minimal Diameter - 8.0 x 6.0 mm  Tortuosity - Mild  Calcification - Mild  LEFT PELVIS:  Left Common Iliac Artery -  Minimal Diameter - 8.1 x 9.0 mm  Tortuosity - Mild  Calcification - Mild  Left External Iliac Artery -  Minimal Diameter - 8.4 x 8.3 mm  Tortuosity - Moderate  Calcification - None  Left Common Femoral Artery -  Minimal Diameter - 7.3 x 8.7 mm  Tortuosity - Mild  Calcification - Mild  Review of the MIP images confirms the above findings.  IMPRESSION: 1. Vascular findings and measurements pertinent to potential TAVR procedure, as detailed above. This patient does appear to have suitable pelvic arterial access bilaterally. 2. 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 3. Small hiatal  hernia. 4. Partially calcified 1.5 x 0.8 cm nodule in the posterior aspect of the right lobe of the thyroid gland. Followup evaluation with nonemergent thyroid ultrasound is suggested. This follows  ACR consensus guidelines: Managing Incidental Thyroid Nodules Detected on Imaging: White Paper of the ACR Incidental Thyroid Findings Committee. J Am Coll Radiol 2015; 12:143-150. 5. Additional incidental findings, as above.   Electronically Signed  By: Vinnie Langton M.D.  On: 02/13/2015 16:47    Impression:  Patient has stage D severe symptomatic aortic stenosis. He presents with progressive symptoms of exertional shortness of breath and fatigue consistent with chronic diastolic congestive heart failure, New York Heart Association functional class II. I have personally reviewed the patient's recent transthoracic echocardiogram and diagnostic cardiac catheterization, cardiac CT and CTA of the chest, abdomen, and pelvis. The patient now has critical aortic stenosis with heavy calcification and severe restriction of leaflet mobility involving all 3 leaflets of the aortic valve. Left ventricular systolic function remains stable with moderate chronic systolic dysfunction and an ejection fraction of 45-50%. Diagnostic cardiac catheterization demonstrates continued patency of most of the bypass graft from the patient's previous coronary artery bypass surgery in 1997. There is a high-grade stenosis in the third obtuse marginal branch of the left circumflex system beyond the insertion of the saphenous vein graft which can be treated medically. I agree that the risks associated with conventional surgical aortic valve replacement with or without redo coronary artery bypass grafting would be high because of the patient's advanced age, previous coronary artery bypass surgery, left ventricular systolic dysfunction, and relatively small physical stature resulting in a small aortic root. I think that TAVR would be the best treatment for [name]. His CT scans show that he is a candidate for a 26 mm Sapien S3 valve delivered by a transfemoral route from either side.   During the course of the patient's  preoperative work up they have been evaluated comprehensively by a multidisciplinary team of specialists coordinated through the [facility] in the [facility].  They have been demonstrated to suffer from symptomatic severe aortic stenosis as noted above. The patient has been counseled extensively as to the relative risks and benefits of all options for the treatment of severe aortic stenosis including long term medical therapy, conventional surgery for aortic valve replacement, and transcatheter aortic valve replacement.  The patient has been independently evaluated by two cardiac surgeons including myself and Dr. Roxy Manns, and  they are felt to be at high risk for conventional surgical aortic valve replacement based upon a predicted risk of mortality using the Society of Thoracic Surgeons risk calculator of 8% and both of [place] feel that the patient would be a high risk candidate for conventional surgery .    Following the decision to proceed with transcatheter aortic valve replacement, a discussion has been held regarding what types of management strategies would be attempted intraoperatively in the event of life-threatening complications, including whether or not the patient would be considered a candidate for the use of cardiopulmonary bypass and/or conversion to open sternotomy for attempted surgical intervention.  The patient has been advised of a variety of complications that might develop including but not limited to risks of death, stroke, paravalvular leak, aortic dissection or other major vascular complications, aortic annulus rupture, device embolization, cardiac rupture or perforation, mitral regurgitation, acute myocardial infarction, arrhythmia, heart block or bradycardia requiring permanent pacemaker placement, congestive heart failure, respiratory failure, renal failure, pneumonia, infection, other late complications  related to structural valve  deterioration or migration, or other complications that might ultimately cause a temporary or permanent loss of functional independence or other long term morbidity.  The patient provides full informed consent for the procedure as described and all questions were answered.   Plan:  Transfemoral TAVR on 03/10/2015    Gaye Pollack, MD

## 2015-03-10 NOTE — Progress Notes (Signed)
      [address]       [place] [address]             [phone]      S/p TAVR  BP 153/64 mmHg  Pulse 42  Temp(Src) 97 F (36.1 C) (Oral)  Resp 20  Ht 5\' 2"  (1.575 m)  Wt 135 lb 9.6 oz (61.508 kg)  BMI 24.80 kg/m2  SpO2 97%  CO= 6.04 PA 33/14   Intake/Output Summary (Last 24 hours) at 03/10/15 1811 Last data filed at 03/10/15 1530  Gross per 24 hour  Intake   2000 ml  Output    830 ml  Net   1170 ml    Doing well early postop  Remo Lipps C. Roxan Hockey, MD [facility] [phone]

## 2015-03-10 NOTE — Transfer of Care (Signed)
Immediate Anesthesia Transfer of Care Note  Patient: Jesus [name]  Procedure(s) Performed: Procedure(s): TRANSCATHETER AORTIC VALVE REPLACEMENT, TRANSFEMORAL (N/A) TRANSESOPHAGEAL ECHOCARDIOGRAM (TEE) (N/A)  Patient Location: ICU  Anesthesia Type:General  Level of Consciousness: awake  Airway & Oxygen Therapy: Patient Spontanous Breathing and Patient connected to nasal cannula oxygen  Post-op Assessment: Report given to RN  Post vital signs: Reviewed and stable  Last Vitals:  Filed Vitals:   03/10/15 1336  BP:   Pulse: 42  Temp:   Resp:     Complications: No apparent anesthesia complications

## 2015-03-11 ENCOUNTER — Other Ambulatory Visit: Payer: Self-pay

## 2015-03-11 ENCOUNTER — Inpatient Hospital Stay (HOSPITAL_COMMUNITY): Payer: Medicare Other

## 2015-03-11 DIAGNOSIS — Z954 Presence of other heart-valve replacement: Secondary | ICD-10-CM

## 2015-03-11 LAB — CBC
HEMATOCRIT: 34.1 % — AB (ref 39.0–52.0)
Hemoglobin: 11.2 g/dL — ABNORMAL LOW (ref 13.0–17.0)
MCH: 32.2 pg (ref 26.0–34.0)
MCHC: 32.8 g/dL (ref 30.0–36.0)
MCV: 98 fL (ref 78.0–100.0)
Platelets: 105 10*3/uL — ABNORMAL LOW (ref 150–400)
RBC: 3.48 MIL/uL — AB (ref 4.22–5.81)
RDW: 13.7 % (ref 11.5–15.5)
WBC: 9 10*3/uL (ref 4.0–10.5)

## 2015-03-11 LAB — BASIC METABOLIC PANEL
Anion gap: 10 (ref 5–15)
BUN: 8 mg/dL (ref 6–23)
CHLORIDE: 103 mmol/L (ref 96–112)
CO2: 23 mmol/L (ref 19–32)
Calcium: 8.3 mg/dL — ABNORMAL LOW (ref 8.4–10.5)
Creatinine, Ser: 0.86 mg/dL (ref 0.50–1.35)
GFR calc Af Amer: >90 mL/min (ref >90)
GFR calc non Af Amer: 82 mL/min — ABNORMAL LOW (ref >90)
GLUCOSE: 68 mg/dL — AB (ref 70–99)
Potassium: 3.9 mmol/L (ref 3.5–5.1)
Sodium: 136 mmol/L (ref 135–145)

## 2015-03-11 LAB — GLUCOSE, CAPILLARY
GLUCOSE-CAPILLARY: 77 mg/dL (ref 70–99)
GLUCOSE-CAPILLARY: 61 mg/dL — AB (ref 70–99)
GLUCOSE-CAPILLARY: 135 mg/dL — AB (ref 70–99)
Glucose-Capillary: 145 mg/dL — ABNORMAL HIGH (ref 70–99)

## 2015-03-11 LAB — MAGNESIUM: MAGNESIUM: 1.7 mg/dL (ref 1.5–2.5)

## 2015-03-11 MED ORDER — WARFARIN - PHARMACIST DOSING INPATIENT
Freq: Every day | Status: DC
  Administered 2015-03-11: 1

## 2015-03-11 MED ORDER — ASPIRIN EC 81 MG PO TBEC
81.0000 mg | DELAYED_RELEASE_TABLET | Freq: Every day | ORAL | Status: DC
  Administered 2015-03-12: 81 mg via ORAL
  Filled 2015-03-11: qty 1

## 2015-03-11 MED ORDER — AMLODIPINE BESYLATE 5 MG PO TABS
5.0000 mg | ORAL_TABLET | Freq: Every day | ORAL | Status: DC
  Administered 2015-03-11 – 2015-03-12 (×2): 5 mg via ORAL
  Filled 2015-03-11 (×2): qty 1

## 2015-03-11 MED ORDER — WARFARIN SODIUM 6 MG PO TABS
6.0000 mg | ORAL_TABLET | Freq: Once | ORAL | Status: AC
  Administered 2015-03-11: 6 mg via ORAL
  Filled 2015-03-11: qty 1

## 2015-03-11 MED FILL — Magnesium Sulfate Inj 50%: INTRAMUSCULAR | Qty: 10 | Status: AC

## 2015-03-11 MED FILL — Heparin Sodium (Porcine) Inj 1000 Unit/ML: INTRAMUSCULAR | Qty: 30 | Status: AC

## 2015-03-11 MED FILL — Potassium Chloride Inj 2 mEq/ML: INTRAVENOUS | Qty: 40 | Status: AC

## 2015-03-11 NOTE — Progress Notes (Signed)
Utilization Review Completed.  

## 2015-03-11 NOTE — Addendum Note (Signed)
Addendum  created 03/11/15 2042 by Roberts Gaudy, MD   Modules edited: Notes Section   Notes Section:  File: [id number]; [place]: [phone]

## 2015-03-11 NOTE — Plan of Care (Signed)
Problem: Phase I Progression Outcomes Goal: Vascular site scale level 0 - I Vascular Site Scale Level 0: No bruising/bleeding/hematoma Level I (Mild): Bruising/Ecchymosis, minimal bleeding/ooozing, palpable hematoma < 3 cm Level II (Moderate): Bleeding not affecting hemodynamic parameters, pseudoaneurysm, palpable hematoma > 3 cm Level III (Severe) Bleeding which affects hemodynamic parameters or retroperitoneal hemorrhage  Outcome: Completed/Met Date Met:  03/11/15 Level I

## 2015-03-11 NOTE — Progress Notes (Signed)
[address]       ,[place] [address]             [phone]        CARDIOTHORACIC SURGERY PROGRESS NOTE   R1 Day Post-Op Procedure(s) (LRB): TRANSCATHETER AORTIC VALVE REPLACEMENT, TRANSFEMORAL (N/A) TRANSESOPHAGEAL ECHOCARDIOGRAM (TEE) (N/A)  Subjective: Looks great.  No pain.  No SOB  Objective: Vital signs: BP Readings from Last 1 Encounters:  03/10/15 153/64   Pulse Readings from Last 1 Encounters:  03/11/15 75   Resp Readings from Last 1 Encounters:  03/11/15 16   Temp Readings from Last 1 Encounters:  03/11/15 99.9 F (37.7 C)     Hemodynamics: PAP: (6-41)/(1-18) 8/1 mmHg CO:  [4.8 L/min-7.8 L/min] 7.8 L/min CI:  [3 L/min/m2-4.8 L/min/m2] 4.8 L/min/m2  Physical Exam:  Rhythm:   sinus  Breath sounds: clear  Heart sounds:  RRR w/ soft systolic murmur  Incisions:  Clean and dry  Abdomen:  Soft, non-distended, non-tender  Extremities:  Warm, well-perfused    Intake/Output from previous day: 04/05 0701 - 04/06 0700 In: 2923 [P.O.:240; I.V.:2483; IV Piggyback:200] Out: 2780 [Urine:2750; Blood:30] Intake/Output this shift:    Lab Results:  CBC: Recent Labs  03/10/15 1630 03/10/15 1700 03/11/15 0454  WBC 10.2  --  9.0  HGB 10.3* 10.9* 11.2*  HCT 30.6* 32.0* 34.1*  PLT 95*  --  105*    BMET:  Recent Labs  03/10/15 1407 03/10/15 1700 03/11/15 0454  NA 138 139 136  K 3.8 3.3* 3.9  CL 103  --  103  CO2  --   --  23  GLUCOSE 106* 90 68*  BUN 9  --  8  CREATININE 0.60  --  0.86  CALCIUM  --   --  8.3*     CBG (last 3)   Recent Labs  03/10/15 2333 03/11/15 0348 03/11/15 0810  GLUCAP 76 77 61*    ABG    Component Value Date/Time   PHART 7.374 03/10/2015 1647   PCO2ART 41.3 03/10/2015 1647   PO2ART 78.0* 03/10/2015 1647   HCO3 24.3* 03/10/2015 1647   TCO2 26 03/10/2015 1647   ACIDBASEDEF 1.0 03/10/2015 1647   O2SAT 96.0 03/10/2015 1647    EKG: NSR w/ PVC's, no acute ischemic changes     CXR: PORTABLE CHEST - 1 VIEW  COMPARISON: March 10, 2015  FINDINGS: Swan-Ganz catheter tip is now in the proximal left upper lobe pulmonary artery. The previously noted temporary pacemaker lead placed from a femoral approach is no longer appreciable. No pneumothorax. There is no edema or consolidation. Heart is mildly enlarged with pulmonary vascular within normal limits. Patient is status post coronary artery bypass grafting. Patient is also status post aortic valve replacement.  IMPRESSION: Swan-Ganz catheter tip in proximal right upper lobe pulmonary artery. No pneumothorax. No edema or consolidation. No change in cardiac silhouette. Pacemaker lead placed from femoral location no longer appreciable.   Electronically Signed  By: Lowella Grip III M.D.  On: 03/11/2015 07:46  Assessment/Plan: S/P Procedure(s) (LRB): TRANSCATHETER AORTIC VALVE REPLACEMENT, TRANSFEMORAL (N/A) TRANSESOPHAGEAL ECHOCARDIOGRAM (TEE) (N/A)  Doing very well POD1 Expected post op acute blood loss anemia, very mild, stable Expected post op thrombocytopenia, mild History of PAF Essential hypertension Chronic combined systolic and diastolic CHF   Agree w/ plan as outlined by [name]  D/C lines, mobilize, trasfer to floor  ASA + warfarin  Routine post op ECHO should be performed today  I  spent in excess of 15 minutes during the conduct of this hospital encounter and >50% of this time involved direct face-to-face encounter with the patient for counseling and/or coordination of their care.   Rexene Alberts 03/11/2015 8:55 AM

## 2015-03-11 NOTE — Progress Notes (Signed)
  Echocardiogram 2D Echocardiogram has been performed.  Jesus [name] Jesus [name] 03/11/2015, 11:33 AM

## 2015-03-11 NOTE — Progress Notes (Signed)
Anesthesiology Follow-up:  Awake and alert, in good spirits, moved to 2W today, rhythm stable with SR and IVCD  T-36.7 BP 115/61 HR 61 RR-18 O2 sat 98% on RA  K-3.9 glucose 126 BUN/Cr. 8/0.86 H/H 11.2.34 Platelets 105,000  ECHO: EF 35-40% No AI   Doing well, one day S/P TAVR, no apparent complications  Roberts Gaudy

## 2015-03-11 NOTE — Progress Notes (Addendum)
    Subjective:  Feels fine this am. No CP or dyspnea. Doesn't like having SCD's on his legs.  Objective:  Vital Signs in the last 24 hours: Temp:  [97 F (36.1 C)-99.9 F (37.7 C)] 99.9 F (37.7 C) (04/06 0700) Pulse Rate:  [42-93] 75 (04/06 0700) Resp:  [14-23] 16 (04/06 0700) BP: (153)/(64) 153/64 mmHg (04/05 0921) SpO2:  [92 %-99 %] 92 % (04/06 0700) Weight:  [134 lb 11.2 oz (61.1 kg)-135 lb 9.6 oz (61.508 kg)] 134 lb 11.2 oz (61.1 kg) (04/06 0500)  Intake/Output from previous day: 04/05 0701 - 04/06 0700 In: 2923 [P.O.:240; I.V.:2483; IV Piggyback:200] Out: 2780 [Urine:2750; Blood:30]  Physical Exam: Pt is alert and oriented, NAD HEENT: normal Neck: JVP - normal Lungs: CTA bilaterally CV: RRR with 2/6 SEM at the LSB Abd: soft, NT, Positive BS, no hepatomegaly Ext: no C/C/E, distal pulses intact and equal, groin sites clear Skin: warm/dry no rash   Lab Results:  Recent Labs  03/10/15 1630 03/10/15 1700 03/11/15 0454  WBC 10.2  --  9.0  HGB 10.3* 10.9* 11.2*  PLT 95*  --  105*    Recent Labs  03/10/15 1407 03/10/15 1700 03/11/15 0454  NA 138 139 136  K 3.8 3.3* 3.9  CL 103  --  103  CO2  --   --  23  GLUCOSE 106* 90 68*  BUN 9  --  8  CREATININE 0.60  --  0.86   No results for input(s): TROPONINI in the last 72 hours.  Invalid input(s): CK, MB  Cardiac Studies: 2D Echo pending  Tele: NSR, PVC's  Assessment/Plan:  1. Acute on chronic systolic and diastolic heart failure, NYHA III 2. Severe AS s/p TAVR, POD #1 3. HTN, essential 4. Thrombocytopenia, mild, expected post-TAVR 5. Anemia, mild, expected post-op 6. Paroxysmal atrial fibrillation  Tele and hemodynamics reviewed and look good. Will d/c lines and tx to tele bed. Anticipate d/c tomorrow if he continues to progress well. Echo today. Start warfarin back today for PAF. Plan to treat with ASA 81 mg and warfarin. Resume amlodipine, hold losartan and follow BP.  [name],  M.D. 03/11/2015, 8:22 AM

## 2015-03-11 NOTE — Progress Notes (Signed)
ANTICOAGULATION CONSULT NOTE - Initial Consult  Pharmacy Consult for Coumadin Indication: atrial fibrillation  Allergies  Allergen Reactions  . Codeine Other (See Comments)    Pt does not remember  . Iodine Swelling    Patient Measurements: Height: 5\' 2"  (157.5 cm) Weight: 134 lb 11.2 oz (61.1 kg) IBW/kg (Calculated) : 54.6  Vital Signs: Temp: 98.4 F (36.9 C) (04/06 1234) Temp Source: Oral (04/06 1234) BP: 122/51 mmHg (04/06 1100) Pulse Rate: 74 (04/06 1100)  Labs:  Recent Labs  03/10/15 1247 03/10/15 1407 03/10/15 1630 03/10/15 1700 03/11/15 0454  HGB 10.9* 10.5* 10.3* 10.9* 11.2*  HCT 32.0* 31.0* 30.6* 32.0* 34.1*  PLT  --   --  95*  --  105*  APTT  --   --  30  --   --   LABPROT  --   --  15.9*  --   --   INR  --   --  1.25  --   --   CREATININE 0.50 0.60  --   --  0.86    Estimated Creatinine Clearance: 56.4 mL/min (by C-G formula based on Cr of 0.86).   Medical History: Past Medical History  Diagnosis Date  . Esophageal reflux   . Atrial fibrillation     holding sinus rhythm on Amiodarone  . Aortic stenosis     mild with a mean aortic valve gradient of 12 mmHg  . CAD (coronary artery disease) 1997    CABG  . Unspecified essential hypertension   . History of transesophageal echocardiography (TEE) for monitoring   . Cardiomyopathy   . Other and unspecified hyperlipidemia   . Skin lesions, generalized     facial which may represent actinic keratoses and possible photosensitivity from Amiodarone  . GERD (gastroesophageal reflux disease)   . History of colonoscopy   . 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 Acute myocardial infarction of inferior wall 1980  . Dysrhythmia   . Headache   . 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 acetaminophen (TYLENOL) 500 MG tablet Take 500 mg by mouth every 6 (six) hours as needed for moderate pain.   03/09/2015 at Unknown time  . amiodarone (PACERONE) 200 MG tablet Take 0.5 tablets (100 mg total) by mouth daily. 30 tablet 2 03/10/2015 at 0600  . amLODipine (NORVASC) 5 MG tablet Take 1 tablet (5 mg total) by mouth daily. 30 tablet 3 03/10/2015 at 0600  . Ascorbic Acid (VITAMIN C) 1000 MG tablet Take 1,000 mg by mouth daily.     03/09/2015 at Unknown time  . aspirin 81 MG tablet Take 81 mg by mouth daily.     03/09/2015 at Unknown time  . atorvastatin (LIPITOR) 80 MG tablet Take 0.5 tablets (40 mg total) by mouth daily. 45 tablet 5 03/09/2015 at Unknown time  . loratadine (CLARITIN) 10 MG tablet Take 10 mg by mouth daily as needed for allergies.    Past Week at Unknown time  . losartan (COZAAR) 100 MG tablet Take 1 tablet (100 mg total) by mouth daily. 30 tablet 5 03/10/2015 at 0600  . Multiple Vitamins-Minerals (MULTIVITAMIN,TX-MINERALS) tablet Take 1 tablet by mouth daily.  03/09/2015 at Unknown time  . pantoprazole (PROTONIX) 40 MG tablet Take 1 tablet (40 mg total) by mouth daily. 90 tablet 2 03/09/2015 at Unknown time  . warfarin (COUMADIN) 5 MG tablet Take as directed by coumadin clinic. (Patient taking differently: Take 5 mg by mouth daily. Take 5mg  by mouth Tuesday, Thursday and Saturday. Take 2.5mg  by mouth on all other days.) 30 tablet 3 Past Week at Unknown time    Assessment: 74 y/o male s/p TAVR who was on Coumadin PTA for Afib. Pharmacy consulted to resume. INR was subtherapeutic at 1.25 as of 4/5. No bleeding noted, Hb stable, platelets low at 105.  PTA: 2.5 mg daily except 5 mg on TTS  Goal of Therapy:  INR 2-3 Monitor platelets by anticoagulation protocol: Yes   Plan:  - Coumadin 6 mg PO tonight - INR daily  [facility], Pharm.D., BCPS Clinical Pharmacist Pager:  [phone] 03/11/2015 12:40 PM

## 2015-03-12 ENCOUNTER — Encounter (HOSPITAL_COMMUNITY): Payer: Self-pay | Admitting: Cardiovascular Disease

## 2015-03-12 DIAGNOSIS — I10 Essential (primary) hypertension: Secondary | ICD-10-CM

## 2015-03-12 DIAGNOSIS — I350 Nonrheumatic aortic (valve) stenosis: Principal | ICD-10-CM

## 2015-03-12 DIAGNOSIS — Z7901 Long term (current) use of anticoagulants: Secondary | ICD-10-CM

## 2015-03-12 DIAGNOSIS — I480 Paroxysmal atrial fibrillation: Secondary | ICD-10-CM

## 2015-03-12 LAB — PROTIME-INR
INR: 1.27 (ref 0.00–1.49)
Prothrombin Time: 16 seconds — ABNORMAL HIGH (ref 11.6–15.2)

## 2015-03-12 LAB — TYPE AND SCREEN
ABO/RH(D): O POS
Antibody Screen: NEGATIVE
UNIT DIVISION: 0
UNIT DIVISION: 0

## 2015-03-12 LAB — CBC
HEMATOCRIT: 33.8 % — AB (ref 39.0–52.0)
HEMOGLOBIN: 11 g/dL — AB (ref 13.0–17.0)
MCH: 31.9 pg (ref 26.0–34.0)
MCHC: 32.5 g/dL (ref 30.0–36.0)
MCV: 98 fL (ref 78.0–100.0)
Platelets: 85 10*3/uL — ABNORMAL LOW (ref 150–400)
RBC: 3.45 MIL/uL — ABNORMAL LOW (ref 4.22–5.81)
RDW: 13.8 % (ref 11.5–15.5)
WBC: 7.8 10*3/uL (ref 4.0–10.5)

## 2015-03-12 LAB — BASIC METABOLIC PANEL
ANION GAP: 7 (ref 5–15)
BUN: 13 mg/dL (ref 6–23)
CO2: 24 mmol/L (ref 19–32)
CREATININE: 0.94 mg/dL (ref 0.50–1.35)
Calcium: 8.6 mg/dL (ref 8.4–10.5)
Chloride: 109 mmol/L (ref 96–112)
GFR calc non Af Amer: 79 mL/min — ABNORMAL LOW (ref >90)
Glucose, Bld: 112 mg/dL — ABNORMAL HIGH (ref 70–99)
POTASSIUM: 4.2 mmol/L (ref 3.5–5.1)
Sodium: 140 mmol/L (ref 135–145)

## 2015-03-12 MED ORDER — TRAMADOL HCL 50 MG PO TABS: 50.0000 mg | ORAL_TABLET | ORAL | Status: DC | PRN

## 2015-03-12 MED ORDER — LOSARTAN POTASSIUM 50 MG PO TABS
100.0000 mg | ORAL_TABLET | Freq: Every day | ORAL | Status: DC
  Administered 2015-03-12: 100 mg via ORAL
  Filled 2015-03-12: qty 2

## 2015-03-12 NOTE — Discharge Instructions (Signed)
Have INR check next week, office will contact you   Arteriogram  An arteriogram (or angiogram) is an X-ray test of your blood vessels. You will be awake during the test. This test looks for:  Blocked blood vessels.  Blood vessels that are not normal. BEFORE THE TEST Schedule an appointment for your arteriogram. Let the person know if:  You have diabetes.  You are allergic to any food or medicine.  You are allergic to X-ray dye (contrast).  You have asthma or had it when you were a child.  You are pregnant or you might be pregnant.  You are taking aspirin or blood thinners. The night before the test:   Do not  eat or drink anything after midnight. On the morning of your test:   Do not drive. Have someone bring you and take you home.  Bring all the medicines you need to take with you. If you have diabetes, do not take your insulin before the test, but bring it with you. THE TEST  You will lie on the X-ray table.  An IV tube is started in your arm.  Your blood pressure and heartbeat are checked during the test.  The upper part of your leg (groin) is shaved and washed with special soap.  You are then covered with a germ free (sterile) sheet. Keep your arms at your side under the sheet at all times so that germs do not get on the sheet.  The skin is numbed where the thin tube (catheter) is put into your upper leg. The thin tube is moved up into the blood vessels that your doctor wants to see.  Dye is put in through the tube. You may have a feeling of heat as the dye moves into your body.  X-ray pictures of your blood vessels are taken. Do not move while the dye goes in and pictures are taken. AFTER THE TEST  The thin tube will be taken out of your upper leg.  The nurse will check:  Your blood pressure.  The place where the thin tube was taken out to make sure it is not bleeding.  The blood flow to your feet.  Lie flat in bed. Keep your leg straight for 6 hours  after the thin tube is taken out.  Ask your doctor or nurse if you should take your regular medications that you brought. Finding out the results of your test Ask when your test results will be ready. Make sure you get your test results. Document Released: 02/17/2009 Document Revised: 11/26/2013 Document Reviewed: 02/17/2009 [facility] Patient Information 2015 [place], [place]. This information is not intended to replace advice given to you by your health care provider. Make sure you discuss any questions you have with your health care provider.

## 2015-03-12 NOTE — Progress Notes (Signed)
SUBJECTIVE: No chest pain or SOB. Neck is sore.   BP 133/54 mmHg  Pulse 64  Temp(Src) 98.9 F (37.2 C) (Oral)  Resp 18  Ht 5\' 2"  (1.575 m)  Wt 134 lb 11.2 oz (61.1 kg)  BMI 24.63 kg/m2  SpO2 97%  Intake/Output Summary (Last 24 hours) at 03/12/15 6063 Last data filed at 03/11/15 1830  Gross per 24 hour  Intake     53 ml  Output    690 ml  Net   -637 ml    PHYSICAL EXAM General: Well developed, well nourished, in no acute distress. Alert and oriented x 3.  Psych:  Good affect, responds appropriately Neck: No JVD. No masses noted.  Lungs: Clear bilaterally with no wheezes or rhonci noted.  Heart: RRR with soft systolic murmur Abdomen: Bowel sounds are present. Soft, non-tender.  Extremities: No lower extremity edema. Both groins stable. Mild ecchymosis left groin.   LABS: Basic Metabolic Panel:  Recent Labs  03/11/15 0454 03/12/15 0435  NA 136 140  K 3.9 4.2  CL 103 109  CO2 23 24  GLUCOSE 68* 112*  BUN 8 13  CREATININE 0.86 0.94  CALCIUM 8.3* 8.6  MG 1.7  --    CBC:  Recent Labs  03/11/15 0454 03/12/15 0435  WBC 9.0 7.8  HGB 11.2* 11.0*  HCT 34.1* 33.8*  MCV 98.0 98.0  PLT 105* 85*   Current Meds: . acetaminophen  1,000 mg Oral 4 times per day   Or  . acetaminophen (TYLENOL) oral liquid 160 mg/5 mL  1,000 mg Per Tube 4 times per day  . acetaminophen (TYLENOL) oral liquid 160 mg/5 mL  650 mg Per Tube Once   Or  . acetaminophen  650 mg Rectal Once  . amiodarone  100 mg Oral Daily  . amLODipine  5 mg Oral Daily  . aspirin EC  81 mg Oral Daily  . atorvastatin  40 mg Oral Daily  . cefUROXime (ZINACEF)  IV  1.5 g Intravenous Q12H  . metoprolol tartrate  12.5 mg Oral BID   Or  . metoprolol tartrate  12.5 mg Per Tube BID  . pantoprazole  40 mg Oral Daily  . Warfarin - Pharmacist Dosing Inpatient   Does not apply q1800   Echo 03/11/15: Left ventricle: The cavity size was normal. Wall thickness was normal. Systolic function was moderately  reduced. The estimated ejection fraction was in the range of 35% to 40%. There is hypokinesis of the entireinferior myocardium. Doppler parameters are consistent with abnormal left ventricular relaxation (grade 1 diastolic dysfunction). - Aortic valve: A bioprosthesis was present. There was mild stenosis. Valve area (VTI): 1.24 cm^2. Valve area (Vmax): 1.16 cm^2. Valve area (Vmean): 1.26 cm^2. - Mitral valve: Calcified annulus. Mildly thickened leaflets . - Left atrium: The atrium was mildly dilated.  ASSESSMENT AND PLAN:  1. Chronic systolic and diastolic heart failure, NYHA III: Volume status is stable.   2. Severe AS: s/p TAVR, POD #2. Echo 03/11/15 with normally functioning bioprosthetic valve. Will continue ASA (along with coumadin for PAF).   3. HTN, essential: BP controlled. Resume home Losartan. Continue amlodipine.   4. Thrombocytopenia: mild, expected post-TAVR  5. Paroxysmal atrial fibrillation: Continue amiodarone. He is now back on coumadin.   Dispo: Ambulate this am. Discharge home. Will need f/u in 7-10 days with office APP and then 30 day f/u with Dr. [name] (has to be [name]). Will need coumadin clinic f/u early next week in  the [employer] office.    Jesus [name]  4/7/20167:02 AM

## 2015-03-12 NOTE — Care Management Note (Signed)
    Page 1 of 1   03/12/2015     3:42:40 PM CARE MANAGEMENT NOTE 03/12/2015  Patient:  Jesus [name], Jesus [name]   Account Number:  [id number]  Date Initiated:  03/12/2015  Documentation initiated by:  Marvetta Gibbons  Subjective/Objective Assessment:   Pt admitted s/p TAVR     Action/Plan:   PTA pt lived at home   Anticipated DC Date:  03/12/2015   Anticipated DC Plan:  HOME/SELF CARE         Choice offered to / List presented to:             Status of service:  Completed, signed off Medicare Important Message given?  NA - LOS <3 / Initial given by admissions (If response is "NO", the following Medicare IM given date fields will be blank) Date Medicare IM given:   Medicare IM given by:   Date Additional Medicare IM given:   Additional Medicare IM given by:    Discharge Disposition:  HOME/SELF CARE  Per UR Regulation:  Reviewed for med. necessity/level of care/duration of stay  If discussed at [place] of Stay Meetings, dates discussed:    Comments:

## 2015-03-12 NOTE — Progress Notes (Signed)
Patient discharged per instructions. Patient's wife at bedside for teaching. All d/c orders/instructions/follow up appts/follow up care/medications/prescriptions discussed as well as signs and symptoms that would require a call to the doctor or a trip to the ED. Patient will leave the unit via wheelchair to be transported home by wife. Roselyn Reef Daniqua Campoy,RN

## 2015-03-12 NOTE — Progress Notes (Signed)
CARDIAC REHAB PHASE I   PRE:  Rate/Rhythm: 62 SR bigeminy PVCs  BP:  Supine:   Sitting: 118/60  Standing:    SaO2: 98%RA  MODE:  Ambulation: 500 ft   POST:  Rate/Rhythm: 67  BP:  Supine:   Sitting: 130/60  Standing:    SaO2: 98%RA 1015-1120 Pt walked 500 ft with hand held asst with steady gait. Tolerated well. Discussed activity with walking instructions, diet watching carbs with HGA1C at 6, IS and CRP 2. Pt agreed to referral to [facility] as he does not know which one he would prefer at this time.    Graylon Good, RN BSN  03/12/2015 11:13 AM

## 2015-03-12 NOTE — Progress Notes (Addendum)
      [address]       ,[place] [address]             [phone]        2 Days Post-Op Procedure(s) (LRB): TRANSCATHETER AORTIC VALVE REPLACEMENT, TRANSFEMORAL (N/A) TRANSESOPHAGEAL ECHOCARDIOGRAM (TEE) (N/A)  Subjective: Patient just finished with breakfast. He has a little incisional pain left groin;otherwise, no complaints.  Objective: Vital signs in last 24 hours: Temp:  [97.7 F (36.5 C)-98.9 F (37.2 C)] 98.9 F (37.2 C) (04/07 0536) Pulse Rate:  [31-81] 64 (04/07 0536) Cardiac Rhythm:  [-] Sinus bradycardia (04/07 0000) Resp:  [18-20] 18 (04/07 0536) BP: (100-133)/(35-79) 133/54 mmHg (04/07 0536) SpO2:  [93 %-98 %] 97 % (04/07 0536)   Current Weight  03/11/15 134 lb 11.2 oz (61.1 kg)      Intake/Output from previous day: 04/06 0701 - 04/07 0700 In: 53 [I.V.:3; IV Piggyback:50] Out: 690 [Urine:690]   Physical Exam:  Cardiovascular: RRR, soft systolic murmur Pulmonary: Clear to auscultation bilaterally; no rales, wheezes, or rhonchi. Abdomen: Soft, non tender, bowel sounds present. Extremities: Trace bilateral lower extremity edema. Wound: Clean and dry.  No erythema or signs of infection. Minor ecchymosis around left groin.  Lab Results: CBC: Recent Labs  03/11/15 0454 03/12/15 0435  WBC 9.0 7.8  HGB 11.2* 11.0*  HCT 34.1* 33.8*  PLT 105* 85*   BMET:  Recent Labs  03/11/15 0454 03/12/15 0435  NA 136 140  K 3.9 4.2  CL 103 109  CO2 23 24  GLUCOSE 68* 112*  BUN 8 13  CREATININE 0.86 0.94  CALCIUM 8.3* 8.6    PT/INR:  Lab Results  Component Value Date   INR 1.27 03/12/2015   INR 1.25 03/10/2015   INR 1.45 03/06/2015   PROTIME 18.7 05/06/2009   ABG:  INR: Will add last result for INR, ABG once components are confirmed Will add last 4 CBG results once components are confirmed  Assessment/Plan:  1. CV - HR in the low 60's. On Amiodarone 100 mg daily, Norvasc 5 mg daily, Losartan 100 mg daily, and Coumadin.  INR 1.27. 2.  Pulmonary - On room air. 3.  Acute blood loss anemia - H and H stable at 11 and 33.8 4. Thrombocytopenia-platelets 85,000   ZIMMERMAN,DONIELLE MPA-C 03/12/2015,8:17 AM  I have seen and examined the patient and agree with the assessment and plan as outlined.  Looks ready for d/c home.  Rexene Alberts 03/12/2015 9:30 AM

## 2015-03-12 NOTE — Discharge Summary (Signed)
Patient ID: Jesus [name],  MRN: [id number], DOB/AGE: May 09, 1941 74 y.o.  Admit date: 03/10/2015 Discharge date: 03/12/2015  Primary Care Provider: [name], MD Primary Cardiologist: [name]  Discharge Diagnoses Principal Problem:   Severe aortic stenosis Active Problems:   S/P TAVR (transcatheter aortic valve replacement)   Essential hypertension   Cardiomyopathy, ischemic-EF 35%   Chronic atrial fibrillation   S/P CABG 1997   Chronic anticoagulation-Couamdin    Procedures: TAVR 03/10/15   Hospital Course:  74 year old male with long-standing history of coronary artery disease with ischemic cardiomyopathy s/p CABG x 8 in 1997 by Dr. [name]. His grafts included a left internal mammary artery sewn sequentially to the diagonal branch and to the left anterior descending coronary artery, saphenous vein graft sewn sequentially to the OM1, OM2, and OM3 branches of the left circumflex coronary artery, and saphenous vein graft sewn sequentially to the AM1, AM2, and posterior descending coronary artery. The patient did well for many years but subsequently developed aortic stenosis which slowly progressed on follow-up echocardiograms. For the last several years he has been followed by Dr. [name]. Over the past year the patient has developed gradual progression of exertional shortness of breath and fatigue.Recently he has developed some mild tightness across his chest associated with exertion. He denies any symptoms occurring at rest. He denies any PND, orthopnea, or lower extremity edema. He was seen in follow-up recently by Dr. [name], and repeat transthoracic echocardiogram revealed further progression in the severity of aortic stenosis with peak velocity across aortic valve measured at 4.8 m/sec corresponding to estimated peak and mean transvalvular gradients of 104 and 57 mmHg, respectively. Left ventricular systolic function remains stable with ejection fraction estimated at  45-50%.The patient subsequently underwent left and right heart catheterization demonstrating severe coronary artery disease with 100% chronic occlusion of the native coronary vessels but continued patency of the majority of bypass grafts placed at the time of surgery in 1997. There was significant stenosis of the third obtuse marginal branch of left circumflex coronary artery beyond the insertion of the vein graft. Pulmonary artery pressures and cardiac output were normal. He was admitted for elective TAVR 03/10/15. He tolerated this well and Dr Julianne Handler feels he can be discharged 03/12/15. He'll need a TOC office vist in 7-10 days and an INR check next week. He should see [name] in 30 days. Other home medications have been resumed.   Discharge Vitals:  Blood pressure 133/54, pulse 64, temperature 98.9 F (37.2 C), temperature source Oral, resp. rate 18, height 5\' 2"  (1.575 m), weight 134 lb 11.2 oz (61.1 kg), SpO2 97 %.    Labs: Results for orders placed or performed during the hospital encounter of 03/10/15 (from the past 24 hour(s))  Glucose, capillary     Status: Abnormal   Collection Time: 03/11/15 12:32 PM  Result Value Ref Range   Glucose-Capillary 145 (H) 70 - 99 mg/dL   Comment 1 Capillary Specimen    Comment 2 Notify RN   Glucose, capillary     Status: Abnormal   Collection Time: 03/11/15  3:43 PM  Result Value Ref Range   Glucose-Capillary 135 (H) 70 - 99 mg/dL   Comment 1 Capillary Specimen    Comment 2 Notify RN   Basic metabolic panel     Status: Abnormal   Collection Time: 03/12/15  4:35 AM  Result Value Ref Range   Sodium 140 135 - 145 mmol/L   Potassium 4.2 3.5 - 5.1 mmol/L  Chloride 109 96 - 112 mmol/L   CO2 24 19 - 32 mmol/L   Glucose, Bld 112 (H) 70 - 99 mg/dL   BUN 13 6 - 23 mg/dL   Creatinine, Ser 0.94 0.50 - 1.35 mg/dL   Calcium 8.6 8.4 - 10.5 mg/dL   GFR calc non Af Amer 79 (L) >90 mL/min   GFR calc Af Amer >90 >90 mL/min   Anion gap 7 5 - 15  CBC      Status: Abnormal   Collection Time: 03/12/15  4:35 AM  Result Value Ref Range   WBC 7.8 4.0 - 10.5 K/uL   RBC 3.45 (L) 4.22 - 5.81 MIL/uL   Hemoglobin 11.0 (L) 13.0 - 17.0 g/dL   HCT 33.8 (L) 39.0 - 52.0 %   MCV 98.0 78.0 - 100.0 fL   MCH 31.9 26.0 - 34.0 pg   MCHC 32.5 30.0 - 36.0 g/dL   RDW 13.8 11.5 - 15.5 %   Platelets 85 (L) 150 - 400 K/uL  Protime-INR     Status: Abnormal   Collection Time: 03/12/15  4:35 AM  Result Value Ref Range   Prothrombin Time 16.0 (H) 11.6 - 15.2 seconds   INR 1.27 0.00 - 1.49    Disposition:      Follow-up Information    Follow up with [name], MD.   Specialty:  Cardiology   Why:  office will call- see an APP in one week, [name] in 30 days   Contact information:   [address] [address] [phone]       Discharge Medications:    Medication List    TAKE these medications        acetaminophen 500 MG tablet  Commonly known as:  TYLENOL  Take 500 mg by mouth every 6 (six) hours as needed for moderate pain.     amiodarone 200 MG tablet  Commonly known as:  PACERONE  Take 0.5 tablets (100 mg total) by mouth daily.     amLODipine 5 MG tablet  Commonly known as:  NORVASC  Take 1 tablet (5 mg total) by mouth daily.     aspirin 81 MG tablet  Take 81 mg by mouth daily.     atorvastatin 80 MG tablet  Commonly known as:  LIPITOR  Take 0.5 tablets (40 mg total) by mouth daily.     loratadine 10 MG tablet  Commonly known as:  CLARITIN  Take 10 mg by mouth daily as needed for allergies.     losartan 100 MG tablet  Commonly known as:  COZAAR  Take 1 tablet (100 mg total) by mouth daily.     multivitamin,tx-minerals tablet  Take 1 tablet by mouth daily.     pantoprazole 40 MG tablet  Commonly known as:  PROTONIX  Take 1 tablet (40 mg total) by mouth daily.     traMADol 50 MG tablet  Commonly known as:  ULTRAM  Take 1-2 tablets (50-100 mg total) by mouth every 4 (four) hours as needed  for moderate pain.     vitamin C 1000 MG tablet  Take 1,000 mg by mouth daily.     warfarin 5 MG tablet  Commonly known as:  COUMADIN  Take as directed by coumadin clinic.         Duration of Discharge Encounter: Greater than 30 minutes including physician time.  Angelena Form PA-C 03/12/2015 8:32 AM

## 2015-03-13 ENCOUNTER — Telehealth: Payer: Self-pay | Admitting: Cardiovascular Disease

## 2015-03-13 NOTE — Telephone Encounter (Signed)
7-10 dayTOC fu --appt 03-17-15 at 130p with Ignacia Bayley

## 2015-03-13 NOTE — Telephone Encounter (Signed)
TCM call - Patient contacted regarding discharge from hospital on 03/12/15.  Patient understands to follow up with Ignacia Bayley, PA, on 4/12 at 1:30 pm.  Patient understands discharge instructions.  Patient understands medications and states his brother went to pick them up from pharmacy.  Patient stated another nurse had already called him today too to check on him. He has no complaints or concerns at this time.

## 2015-03-16 ENCOUNTER — Ambulatory Visit: Payer: Medicare Other | Admitting: Thoracic Surgery (Cardiothoracic Vascular Surgery)

## 2015-03-17 ENCOUNTER — Ambulatory Visit (INDEPENDENT_AMBULATORY_CARE_PROVIDER_SITE_OTHER): Payer: Medicare Other | Admitting: *Deleted

## 2015-03-17 ENCOUNTER — Ambulatory Visit (INDEPENDENT_AMBULATORY_CARE_PROVIDER_SITE_OTHER): Payer: Medicare Other | Admitting: Nurse Practitioner

## 2015-03-17 ENCOUNTER — Encounter: Payer: Self-pay | Admitting: Nurse Practitioner

## 2015-03-17 VITALS — BP 115/52 | HR 64 | Ht 62.0 in | Wt 129.8 lb

## 2015-03-17 DIAGNOSIS — Z954 Presence of other heart-valve replacement: Secondary | ICD-10-CM

## 2015-03-17 DIAGNOSIS — I255 Ischemic cardiomyopathy: Secondary | ICD-10-CM | POA: Diagnosis not present

## 2015-03-17 DIAGNOSIS — I25810 Atherosclerosis of coronary artery bypass graft(s) without angina pectoris: Secondary | ICD-10-CM | POA: Diagnosis not present

## 2015-03-17 DIAGNOSIS — Z952 Presence of prosthetic heart valve: Secondary | ICD-10-CM

## 2015-03-17 DIAGNOSIS — I5022 Chronic systolic (congestive) heart failure: Secondary | ICD-10-CM

## 2015-03-17 DIAGNOSIS — I350 Nonrheumatic aortic (valve) stenosis: Secondary | ICD-10-CM

## 2015-03-17 DIAGNOSIS — I482 Chronic atrial fibrillation, unspecified: Secondary | ICD-10-CM

## 2015-03-17 DIAGNOSIS — Z5181 Encounter for therapeutic drug level monitoring: Secondary | ICD-10-CM | POA: Diagnosis not present

## 2015-03-17 DIAGNOSIS — I359 Nonrheumatic aortic valve disorder, unspecified: Secondary | ICD-10-CM | POA: Diagnosis not present

## 2015-03-17 LAB — POCT INR: INR: 1.3

## 2015-03-17 NOTE — Progress Notes (Signed)
Patient Name: Jesus [name] Date of Encounter: 03/17/2015  Primary Care Provider:  [name], MD Primary Cardiologist:  Jerilynn Mages. [name], MD   Chief Complaint  74 y/o male with h/o CAD and severe AS s/p TAVR on 03/10/2015.  Past Medical History   Past Medical History  Diagnosis Date  . Esophageal reflux   . Atrial fibrillation     holding sinus rhythm on Amiodarone  . Aortic stenosis     mild with a mean aortic valve gradient of 12 mmHg  . CAD (coronary artery disease)     a. S/P Ant MI 1980;  b. 1997 S/P CABG x 8 (LIMA to diag-LAD, SVG to OM1-OM2-OM3, SVG to [facility] - Dr [name]);  c. 01/2015 Cath: 3VD, 8/8 patent grafts.  [name] Unspecified essential hypertension   . History of transesophageal echocardiography (TEE) for monitoring   . Cardiomyopathy   . Other and unspecified hyperlipidemia   . Skin lesions, generalized     facial which may represent actinic keratoses and possible photosensitivity from Amiodarone  . GERD (gastroesophageal reflux disease)   . History of colonoscopy   . Acute myocardial infarction of inferior wall 1980  . Dysrhythmia   . Headache   . S/P TAVR (transcatheter aortic valve replacement)     a. 03/2015 26 mm Edwards Sapien 3 transcatheter heart valve placed via open left transfemoral approach.   Past Surgical History  Procedure Laterality Date  . Tonsillectomy    . Coronary artery bypass graft  02/09/1996    LIMA to diag-LAD, SVG to OM1-OM2-OM3, SVG to [facility]  . Cholecystectomy    . Cardioversion  11/18/2006    Dr. [name]  . Cataract extraction w/ intraocular lens implant  April '13  (Dr. [name])    left eye only  . Left and right heart catheterization with coronary/graft angiogram N/A 01/26/2015    Procedure: LEFT AND RIGHT HEART CATHETERIZATION WITH [name];  Surgeon: [name], MD;  Location: [facility] CATH LAB;  Service: Cardiovascular;  Laterality: N/A;  . Cardiac catheterization    . Eye surgery    .  Transcatheter aortic valve replacement, transfemoral N/A 03/10/2015    Procedure: TRANSCATHETER AORTIC VALVE REPLACEMENT, TRANSFEMORAL;  Surgeon: [name], MD;  Location: [place];  Service: Open Heart Surgery;  Laterality: N/A;  . Tee without cardioversion N/A 03/10/2015    Procedure: TRANSESOPHAGEAL ECHOCARDIOGRAM (TEE);  Surgeon: [name], MD;  Location: [place];  Service: Open Heart Surgery;  Laterality: N/A;    Allergies  Allergies  Allergen Reactions  . Codeine Other (See Comments)    Pt does not remember  . Iodine Swelling    HPI  74 year old male with long-standing history of coronary artery disease with ischemic cardiomyopathy s/p CABG x 8 in 1997 by Dr. [name]. His grafts included a left internal mammary artery sewn sequentially to the diagonal branch and to the left anterior descending coronary artery, saphenous vein graft sewn sequentially to the OM1, OM2, and OM3 branches of the left circumflex coronary artery, and saphenous vein graft sewn sequentially to the AM1, AM2, and posterior descending coronary artery. The patient did well for many years but subsequently developed aortic stenosis which slowly progressed on follow-up echocardiograms. For the last several years he has been followed by Dr. [name]. Over the past year the patient has developed gradual progression of exertional shortness of breath and fatigue.Recently he has developed some mild tightness across his chest associated with exertion. He denies any symptoms occurring at rest. He  denies any PND, orthopnea, or lower extremity edema. He was seen in follow-up recently by Dr. [name], and repeat transthoracic echocardiogram revealed further progression in the severity of aortic stenosis with peak velocity across aortic valve measured at 4.8 m/sec corresponding to estimated peak and mean transvalvular gradients of 104 and 57 mmHg, respectively. Left ventricular systolic function remains stable with ejection fraction  estimated at 45-50%.The patient subsequently underwent left and right heart catheterization demonstrating severe coronary artery disease with 100% chronic occlusion of the native coronary vessels but continued patency of the majority of bypass grafts placed at the time of surgery in 1997. There was significant stenosis of the third obtuse marginal branch of left circumflex coronary artery beyond the insertion of the vein graft. Pulmonary artery pressures and cardiac output were normal. He was admitted for elective TAVR 03/10/15 and had an Edwards Sapien 3 THV (size 26 mm) placed.  Post-procedure course was uncomplicated.  He was d/c'd last week on asa and coumadin (on this chronically).    Since d/c, he has done well.  His left groin is healing well.  He has been walking some daily w/o fatigue, dyspnea, presyncope, or chest pain.  He denies palpitations, pnd, orthopnea, n, v, dizziness, syncope, edema, weight gain, or early satiety.   Home Medications  Prior to Admission medications   Medication Sig Start Date End Date Taking? Authorizing Provider  acetaminophen (TYLENOL) 500 MG tablet Take 500 mg by mouth every 6 (six) hours as needed for moderate pain.   Yes Historical Provider, MD  amiodarone (PACERONE) 200 MG tablet Take 0.5 tablets (100 mg total) by mouth daily. 11/25/14  Yes [name], MD  amLODipine (NORVASC) 5 MG tablet Take 1 tablet (5 mg total) by mouth daily. 03/02/15  Yes [name], MD  Ascorbic Acid (VITAMIN C) 1000 MG tablet Take 1,000 mg by mouth daily.     Yes Historical Provider, MD  aspirin 81 MG tablet Take 81 mg by mouth daily.     Yes Historical Provider, MD  atorvastatin (LIPITOR) 80 MG tablet Take 0.5 tablets (40 mg total) by mouth daily. 10/02/14  Yes [name], MD  loratadine (CLARITIN) 10 MG tablet Take 10 mg by mouth daily as needed for allergies.    Yes Historical Provider, MD  losartan (COZAAR) 100 MG tablet Take 1 tablet (100 mg total) by mouth daily.  01/13/15  Yes [name], MD  Multiple Vitamins-Minerals (MULTIVITAMIN,TX-MINERALS) tablet Take 1 tablet by mouth daily.     Yes Historical Provider, MD  pantoprazole (PROTONIX) 40 MG tablet Take 1 tablet (40 mg total) by mouth daily. 11/05/14  Yes [name], MD  traMADol (ULTRAM) 50 MG tablet Take 1-2 tablets (50-100 mg total) by mouth every 4 (four) hours as needed for moderate pain. 03/12/15  Yes Erlene Quan, PA-C  warfarin (COUMADIN) 5 MG tablet Take as directed by coumadin clinic. Patient taking differently: Take 5 mg by mouth daily. Take 5mg  by mouth Tuesday, Thursday and Saturday. Take 2.5mg  by mouth on all other days. 12/30/14  Yes [name], MD    Review of Systems  As above, doing well.  He denies chest pain, palpitations, dyspnea, pnd, orthopnea, n, v, dizziness, syncope, edema, weight gain, or early satiety. L groin is healing well.  All other systems reviewed and are otherwise negative except as noted above.  Physical Exam  VS:  BP 115/52 mmHg  Pulse 64  Ht 5\' 2"  (1.575 m)  Wt 129 lb  12.8 oz (58.877 kg)  BMI 23.73 kg/m2 , BMI Body mass index is 23.73 kg/(m^2). GEN: Well nourished, well developed, in no acute distress. HEENT: normal. Neck: Supple, no JVD, carotid bruits, or masses. Cardiac: RRR, no rubs, or gallops. 3/6 SEM @ RUSB.  No clubbing, cyanosis, edema.  Radials/DP/PT 2+ and equal bilaterally.  Respiratory:  Respirations regular and unlabored, diminished breath sounds @ the bases. GI: Soft, nontender, nondistended, BS + x 4. MS: no deformity or atrophy. Skin: warm and dry, no rash. Neuro:  Strength and sensation are intact. Psych: Normal affect.  Accessory Clinical Findings  ECG - RSR, 64, PVC's, lbbb  Assessment & Plan  1.  Severe AS s/p TAVR:  S/p TAVR on 4/5.  Post-procedure course uneventful.  He has done remarkably well and has been ambulating w/o limitations.  He has not had fatigue (preop symptom), c/p, dyspnea, presyncope, or  palpitations.  He remains on asa/warfarin (on chronically for PAF).  He has f/u with Dr. [name] in 1 month with echo @ that time.  2.  CAD:  Doing well w/o chest pain.  Remains on asa, statin.  ECG today with LBBB, which appears to be new.  As above, he is doing well w/o chest pain or dyspnea.  S/p cath in Feb revealing patent grafts and severe native 3V CAD.    3.  PAF:  In sinus and on amio and warfarin.  He has not had an INR checked since d/c (usually checked @ [facility]).  We will check today.  4.  HTN:  Stable.  5. ICM/Chronic systolic CHF: EF 46-96% by recent echo.  Euvolemic on exam. Cont arb.  Baseline bradycardia limits usage of bb.  6.  Dispo:  F/u Dr. [name] in 1 month.   Murray Hodgkins, NP 03/17/2015, 2:52 PM

## 2015-03-17 NOTE — Patient Instructions (Addendum)
          Medication Instructions:  None  Labwork: None  Testing/Procedures: Echo  Follow-Up: Keep follow up appointment with Dr. [name]  Any Other Special Instructions Will Be Listed Below (If Applicable).

## 2015-03-20 ENCOUNTER — Other Ambulatory Visit: Payer: Self-pay

## 2015-03-20 ENCOUNTER — Other Ambulatory Visit: Payer: Self-pay | Admitting: Family Medicine

## 2015-03-20 DIAGNOSIS — I350 Nonrheumatic aortic (valve) stenosis: Secondary | ICD-10-CM

## 2015-03-20 DIAGNOSIS — Z952 Presence of prosthetic heart valve: Secondary | ICD-10-CM

## 2015-03-24 ENCOUNTER — Ambulatory Visit (INDEPENDENT_AMBULATORY_CARE_PROVIDER_SITE_OTHER): Payer: Medicare Other | Admitting: General Practice

## 2015-03-24 DIAGNOSIS — Z954 Presence of other heart-valve replacement: Secondary | ICD-10-CM

## 2015-03-24 DIAGNOSIS — I482 Chronic atrial fibrillation, unspecified: Secondary | ICD-10-CM

## 2015-03-24 DIAGNOSIS — Z5181 Encounter for therapeutic drug level monitoring: Secondary | ICD-10-CM

## 2015-03-24 DIAGNOSIS — Z952 Presence of prosthetic heart valve: Secondary | ICD-10-CM

## 2015-03-24 LAB — POCT INR: INR: 1.8

## 2015-03-24 NOTE — Progress Notes (Signed)
Pre visit review using our clinic review tool, if applicable. No additional management support is needed unless otherwise documented below in the visit note. 

## 2015-03-24 NOTE — Progress Notes (Signed)
Agree with plan 

## 2015-03-25 ENCOUNTER — Encounter (HOSPITAL_COMMUNITY): Payer: Self-pay | Admitting: Cardiovascular Disease

## 2015-04-09 ENCOUNTER — Other Ambulatory Visit (HOSPITAL_COMMUNITY): Payer: Medicare Other

## 2015-04-09 ENCOUNTER — Ambulatory Visit (HOSPITAL_COMMUNITY): Payer: Medicare Other | Attending: Internal Medicine

## 2015-04-09 ENCOUNTER — Ambulatory Visit (INDEPENDENT_AMBULATORY_CARE_PROVIDER_SITE_OTHER): Payer: Medicare Other

## 2015-04-09 DIAGNOSIS — I482 Chronic atrial fibrillation, unspecified: Secondary | ICD-10-CM

## 2015-04-09 DIAGNOSIS — Z5181 Encounter for therapeutic drug level monitoring: Secondary | ICD-10-CM

## 2015-04-09 DIAGNOSIS — Z952 Presence of prosthetic heart valve: Secondary | ICD-10-CM

## 2015-04-09 DIAGNOSIS — I350 Nonrheumatic aortic (valve) stenosis: Secondary | ICD-10-CM | POA: Diagnosis not present

## 2015-04-09 DIAGNOSIS — Z954 Presence of other heart-valve replacement: Secondary | ICD-10-CM | POA: Diagnosis not present

## 2015-04-09 LAB — POCT INR: INR: 2.9

## 2015-04-12 DIAGNOSIS — R05 Cough: Secondary | ICD-10-CM | POA: Diagnosis not present

## 2015-04-12 DIAGNOSIS — R062 Wheezing: Secondary | ICD-10-CM | POA: Diagnosis not present

## 2015-04-14 ENCOUNTER — Ambulatory Visit (INDEPENDENT_AMBULATORY_CARE_PROVIDER_SITE_OTHER): Payer: Medicare Other | Admitting: Cardiovascular Disease

## 2015-04-14 ENCOUNTER — Encounter: Payer: Self-pay | Admitting: Cardiovascular Disease

## 2015-04-14 VITALS — BP 138/62 | HR 73 | Ht 62.0 in | Wt 133.6 lb

## 2015-04-14 DIAGNOSIS — I350 Nonrheumatic aortic (valve) stenosis: Secondary | ICD-10-CM | POA: Diagnosis not present

## 2015-04-14 DIAGNOSIS — Z954 Presence of other heart-valve replacement: Secondary | ICD-10-CM

## 2015-04-14 DIAGNOSIS — Z952 Presence of prosthetic heart valve: Secondary | ICD-10-CM

## 2015-04-14 MED ORDER — AMOXICILLIN 500 MG PO TABS: ORAL_TABLET | ORAL | Status: DC

## 2015-04-14 NOTE — Patient Instructions (Signed)
Medication Instructions:  Your physician recommends that you continue on your current medications as directed. Please refer to the Current Medication list given to you today.  Your physician discussed the importance of taking an antibiotic prior to any dental, gastrointestinal, genitourinary procedures to prevent damage to the heart valves from infection. You were given a prescription for an antibiotic based on current SBE prophylaxis guidelines.   Labwork: Your physician recommends that you return for lab work in: [place] (TSH, liver, BMP)   Testing/Procedures: None  Follow-Up: Your physician wants you to follow-up in: [place] with Dr. [name].  You will receive a reminder letter in the mail two months in advance. If you don't receive a letter, please call our office to schedule the follow-up appointment.   Any Other Special Instructions Will Be Listed Below (If Applicable).

## 2015-04-14 NOTE — Progress Notes (Signed)
Cardiology Office Note   Date:  04/14/2015   ID:  Jesus [name], DOB 1941/05/24, MRN [id number]  PCP:  [name], MD  Cardiologist:  [name], MD    No chief complaint on file.    History of Present Illness: Jesus [name] is a 74 y.o. male who presents for follow-up of aortic valve disease. The patient has a long-standing history of CAD with old myocardial infarction in 80. He underwent CABG in 1997. He had developed progressive and severe symptomatic aortic stenosis, and was treated with TAVR on 03/10/2015 via a transfemoral approach. He was treated with a 26 mm Sapien 3 transcatheter heart valve. The patient has had mild to moderate segmental LV dysfunction related to his old MI with an LVEF in the range of 40%.   the patient has done very well. He describes his recovery as "easy." He is back to work and doing  most of his normal physical activities. 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  He denies chest pain or shortness of breath. He has developed a nonproductive cough and has a long history of allergies/asthma. He has been started on azithromycin. He denies fevers , chills, or other complaints.  Past Medical History  Diagnosis Date  . Esophageal reflux   . Atrial fibrillation     holding sinus rhythm on Amiodarone  . Aortic stenosis     mild with a mean aortic valve gradient of 12 mmHg  . CAD (coronary artery disease)     a. S/P Ant MI 1980;  b. 1997 S/P CABG x 8 (LIMA to diag-LAD, SVG to OM1-OM2-OM3, SVG to [facility] - Dr [name]);  c. 01/2015 Cath: 3VD, 8/8 patent grafts.  [name] Unspecified essential hypertension   . History of transesophageal echocardiography (TEE) for monitoring   . Cardiomyopathy   . Other and unspecified hyperlipidemia   . Skin lesions, generalized     facial which may represent actinic keratoses and possible photosensitivity from Amiodarone  . GERD (gastroesophageal reflux disease)   . History of colonoscopy    . Acute myocardial infarction of inferior wall 1980  . Dysrhythmia   . Headache   . S/P TAVR (transcatheter aortic valve replacement)     a. 03/2015 26 mm Edwards Sapien 3 transcatheter heart valve placed via open left transfemoral approach.    Past Surgical History  Procedure Laterality Date  . Tonsillectomy    . Coronary artery bypass graft  02/09/1996    LIMA to diag-LAD, SVG to OM1-OM2-OM3, SVG to [facility]  . Cholecystectomy    . Cardioversion  11/18/2006    Dr. [name]  . Cataract extraction w/ intraocular lens implant  April '13  (Dr. [name])    left eye only  . Left and right heart catheterization with coronary/graft angiogram N/A 01/26/2015    Procedure: LEFT AND RIGHT HEART CATHETERIZATION WITH [name];  Surgeon: [name], MD;  Location: [facility] CATH LAB;  Service: Cardiovascular;  Laterality: N/A;  . Cardiac catheterization    . Eye surgery    . Transcatheter aortic valve replacement, transfemoral N/A 03/10/2015    Procedure: TRANSCATHETER AORTIC VALVE REPLACEMENT, TRANSFEMORAL;  Surgeon: [name], MD;  Location: [place];  Service: Open Heart Surgery;  Laterality: N/A;  . Tee without cardioversion N/A 03/10/2015    Procedure: TRANSESOPHAGEAL ECHOCARDIOGRAM (TEE);  Surgeon: [name], MD;  Location: [place];  Service: Open Heart Surgery;  Laterality: N/A;    Current  Outpatient Prescriptions  Medication Sig Dispense Refill  . acetaminophen (TYLENOL) 500 MG tablet Take 500 mg by mouth every 6 (six) hours as needed for moderate pain.    [name] amiodarone (PACERONE) 200 MG tablet Take 0.5 tablets (100 mg total) by mouth daily. 30 tablet 2  . amLODipine (NORVASC) 5 MG tablet Take 1 tablet (5 mg total) by mouth daily. 30 tablet 3  . Ascorbic Acid (VITAMIN C) 1000 MG tablet Take 1,000 mg by mouth daily.      [name] aspirin 81 MG tablet Take 81 mg by mouth daily.      [name] atorvastatin (LIPITOR) 80 MG tablet Take 0.5 tablets (40 mg total) by mouth daily. 45 tablet  5  . azithromycin (ZITHROMAX) 250 MG tablet Take 250 mg by mouth once.     . loratadine (CLARITIN) 10 MG tablet Take 10 mg by mouth daily as needed for allergies.     [name] losartan (COZAAR) 100 MG tablet Take 1 tablet (100 mg total) by mouth daily. 30 tablet 5  . Multiple Vitamins-Minerals (MULTIVITAMIN,TX-MINERALS) tablet Take 1 tablet by mouth daily.      . pantoprazole (PROTONIX) 40 MG tablet Take 1 tablet (40 mg total) by mouth daily. 90 tablet 2  . traMADol (ULTRAM) 50 MG tablet Take 1-2 tablets (50-100 mg total) by mouth every 4 (four) hours as needed for moderate pain. 30 tablet 0  . warfarin (COUMADIN) 5 MG tablet Take as directed by coumadin clinic. (Patient taking differently: Take 5 mg by mouth daily. Take 5mg  by mouth Tuesday, Thursday and Saturday. Take 2.5mg  by mouth on all other days.) 30 tablet 3   No current facility-administered medications for this visit.    Allergies:   Codeine and Iodine   Social History:  The patient  reports that he has never smoked. He has never used smokeless tobacco. He reports that he does not drink alcohol or use illicit drugs.   Family History:  The patient's  family history includes Atrial fibrillation in his brother; Crohn's disease in his mother; Heart attack (age of onset: 64) in his father; Heart disease in his father and paternal uncle; Heart failure in his father; Prostate cancer in his paternal uncle; Stroke (age of onset: 26) in his mother.    ROS:  Please see the history of present illness.  All other systems are reviewed and negative.    PHYSICAL EXAM: VS:  BP 138/62 mmHg  Pulse 73  Ht 5\' 2"  (1.575 m)  Wt 133 lb 9.6 oz (60.601 kg)  BMI 24.43 kg/m2  SpO2 98% , BMI Body mass index is 24.43 kg/(m^2). GEN: Well nourished, well developed, in no acute distress HEENT: normal Neck: no JVD, no masses.  Cardiac:  Regular rate and rhythm with a 2/6 early peaking systolic murmur at the right upper sternal border               Respiratory:   clear to auscultation bilaterally, normal work of breathing GI: soft, nontender, nondistended, + BS MS: no deformity or atrophy Ext: no pretibial edema, pedal pulses 2+= bilaterally. Left groin incision is healing well with no tenderness, mass, or ecchymosis. Skin: warm and dry, no rash Neuro:  Strength and sensation are intact Psych: euthymic mood, full affect  EKG:  EKG is not ordered today.  Recent Labs: 08/27/2014: TSH 2.61 01/21/2015: Pro B Natriuretic peptide (BNP) 339.0* 03/06/2015: ALT 23 03/11/2015: Magnesium 1.7 03/12/2015: BUN 13; Creatinine 0.94; Hemoglobin 11.0*; Platelets 85*; Potassium 4.2; Sodium 140  Lipid Panel     Component Value Date/Time   CHOL 149 01/21/2015 0827   TRIG 99.0 01/21/2015 0827   TRIG 67 11/20/2006 0748   HDL 49.40 01/21/2015 0827   CHOLHDL 3 01/21/2015 0827   CHOLHDL 3.2 CALC 11/20/2006 0748   VLDL 19.8 01/21/2015 0827   LDLCALC 80 01/21/2015 0827      Wt Readings from Last 3 Encounters:  04/14/15 133 lb 9.6 oz (60.601 kg)  03/17/15 129 lb 12.8 oz (58.877 kg)  03/06/15 135 lb 9.6 oz (61.508 kg)     Cardiac Studies Reviewed: 2-D echocardiogram 04/09/2015: Left ventricle: LVEF is approximately 35 to 40% with severe hypokinesis/ akinesis of the base/mid inferior wall and mid/distal posterior wall The cavity size was normal. Wall thickness was normal.  ------------------------------------------------------------------- Aortic valve: AV prosthesis opens well. Peak and mean gradients through the valve are 24 and 16 mm Hg respectively. Doppler: There was no significant regurgitation.  VTI ratio of LVOT to aortic valve: 0.49. Mean velocity ratio of LVOT to aortic valve: 0.48.  Mean gradient (S): 14 mm Hg.  ------------------------------------------------------------------- Mitral valve:  Calcified annulus. Mildly thickened leaflets . Doppler: There was mild regurgitation.  Peak gradient (D): 6  mm Hg.  ------------------------------------------------------------------- Left atrium: The atrium was moderately dilated.  ------------------------------------------------------------------- Right ventricle: The cavity size was normal. Wall thickness was normal. Systolic function was normal.  ------------------------------------------------------------------- Pulmonic valve:  Structurally normal valve.  Cusp separation was normal. Doppler: Transvalvular velocity was within the normal range. There was mild regurgitation.  ------------------------------------------------------------------- Tricuspid valve:  Structurally normal valve.  Leaflet separation was normal. Doppler: Transvalvular velocity was within the normal range. There was trivial regurgitation.  ------------------------------------------------------------------- Right atrium: The atrium was normal in size.  ------------------------------------------------------------------- Pericardium: There was no pericardial effusion.  ------------------------------------------------------------------- Systemic veins: Inferior vena cava: The vessel was normal in size. The respirophasic diameter changes were in the normal range (= 50%), consistent with normal central venous pressure.  ASSESSMENT AND PLAN: 1.  Aortic valve disease status post TAVR: Postoperative echo personally reviewed with acceptable transvalvular gradients and no significant aortic insufficiency. The patient has New York Heart Association functional class I symptoms.  He is cleared to resume all activities without restrictions. I asked him to be sensible about resting and avoiding hot temperatures during the summer.  He will continue on warfarin for chronic oral anticoagulation and aspirin 81 mg daily. He was advised of the need for SBE prophylaxis. He has dental cleaning scheduled in one month.  2. CAD, native vessel, with old MI:  No anginal  symptoms. Continue current medical therapy.  3. Paroxysmal atrial fibrillation: The patient is maintained on long-term anticoagulation with warfarin. He is maintaining sinus rhythm.  Will arrange a TSH and LFTs at the time of his follow-up visit in 6 months for amiodarone surveillance.  4. Ischemic cardiomyopathy/chronic systolic heart failure: patient is tolerating losartan. He has a history of bradycardia and is not treated with a beta blocker.  5. Essential hypertension: blood pressure is well controlled on amlodipine and losartan.   6. Hyperlipidemia : Lives at goal with LDL of 80 from recent panel.  Current medicines are reviewed with the patient today.  The patient does not have concerns regarding medicines.  Labs/ tests ordered today include:  No orders of the defined types were placed in this encounter.   Disposition:   FU 6 months  Signed, [name], MD  04/14/2015 8:32 AM    [facility]  [address] [place], [place]  [address] Phone: [phone]; Fax: [phone]

## 2015-04-16 ENCOUNTER — Telehealth: Payer: Self-pay | Admitting: Cardiovascular Disease

## 2015-04-16 NOTE — Telephone Encounter (Signed)
New message        Pt is unable to take 4 pills ( amoxicillin 100mg  ) at one time   please give pt a call

## 2015-04-16 NOTE — Telephone Encounter (Signed)
Spoke to pt and he stated that the Amoxicillin pills were too large for him to take. Spoke with Gay Filler, PharmD on which medication to defer to or should pt just break Amoxicillin. Gay Filler states that it is ok for pt to break up Amoxicillin in order to take them. Informed pt and he said that would be fine. Told pt to call us if he had any problems. Pt verbalized understanding and was in agreement with this plan.

## 2015-04-21 ENCOUNTER — Ambulatory Visit (INDEPENDENT_AMBULATORY_CARE_PROVIDER_SITE_OTHER): Payer: Medicare Other | Admitting: General Practice

## 2015-04-21 DIAGNOSIS — Z952 Presence of prosthetic heart valve: Secondary | ICD-10-CM

## 2015-04-21 DIAGNOSIS — Z954 Presence of other heart-valve replacement: Secondary | ICD-10-CM

## 2015-04-21 DIAGNOSIS — I482 Chronic atrial fibrillation, unspecified: Secondary | ICD-10-CM

## 2015-04-21 DIAGNOSIS — Z5181 Encounter for therapeutic drug level monitoring: Secondary | ICD-10-CM

## 2015-04-21 LAB — POCT INR: INR: 3.1

## 2015-04-21 NOTE — Progress Notes (Signed)
Pre visit review using our clinic review tool, if applicable. No additional management support is needed unless otherwise documented below in the visit note. 

## 2015-04-21 NOTE — Progress Notes (Signed)
Agree with plan 

## 2015-04-27 IMAGING — CR DG CHEST 1V PORT
1 series · 1 of 1 positions shown · non-contrast
Comparison: March 06, 2015.

CLINICAL DATA: Severe aortic stenosis.

EXAM:
PORTABLE CHEST - 1 VIEW

[AP]
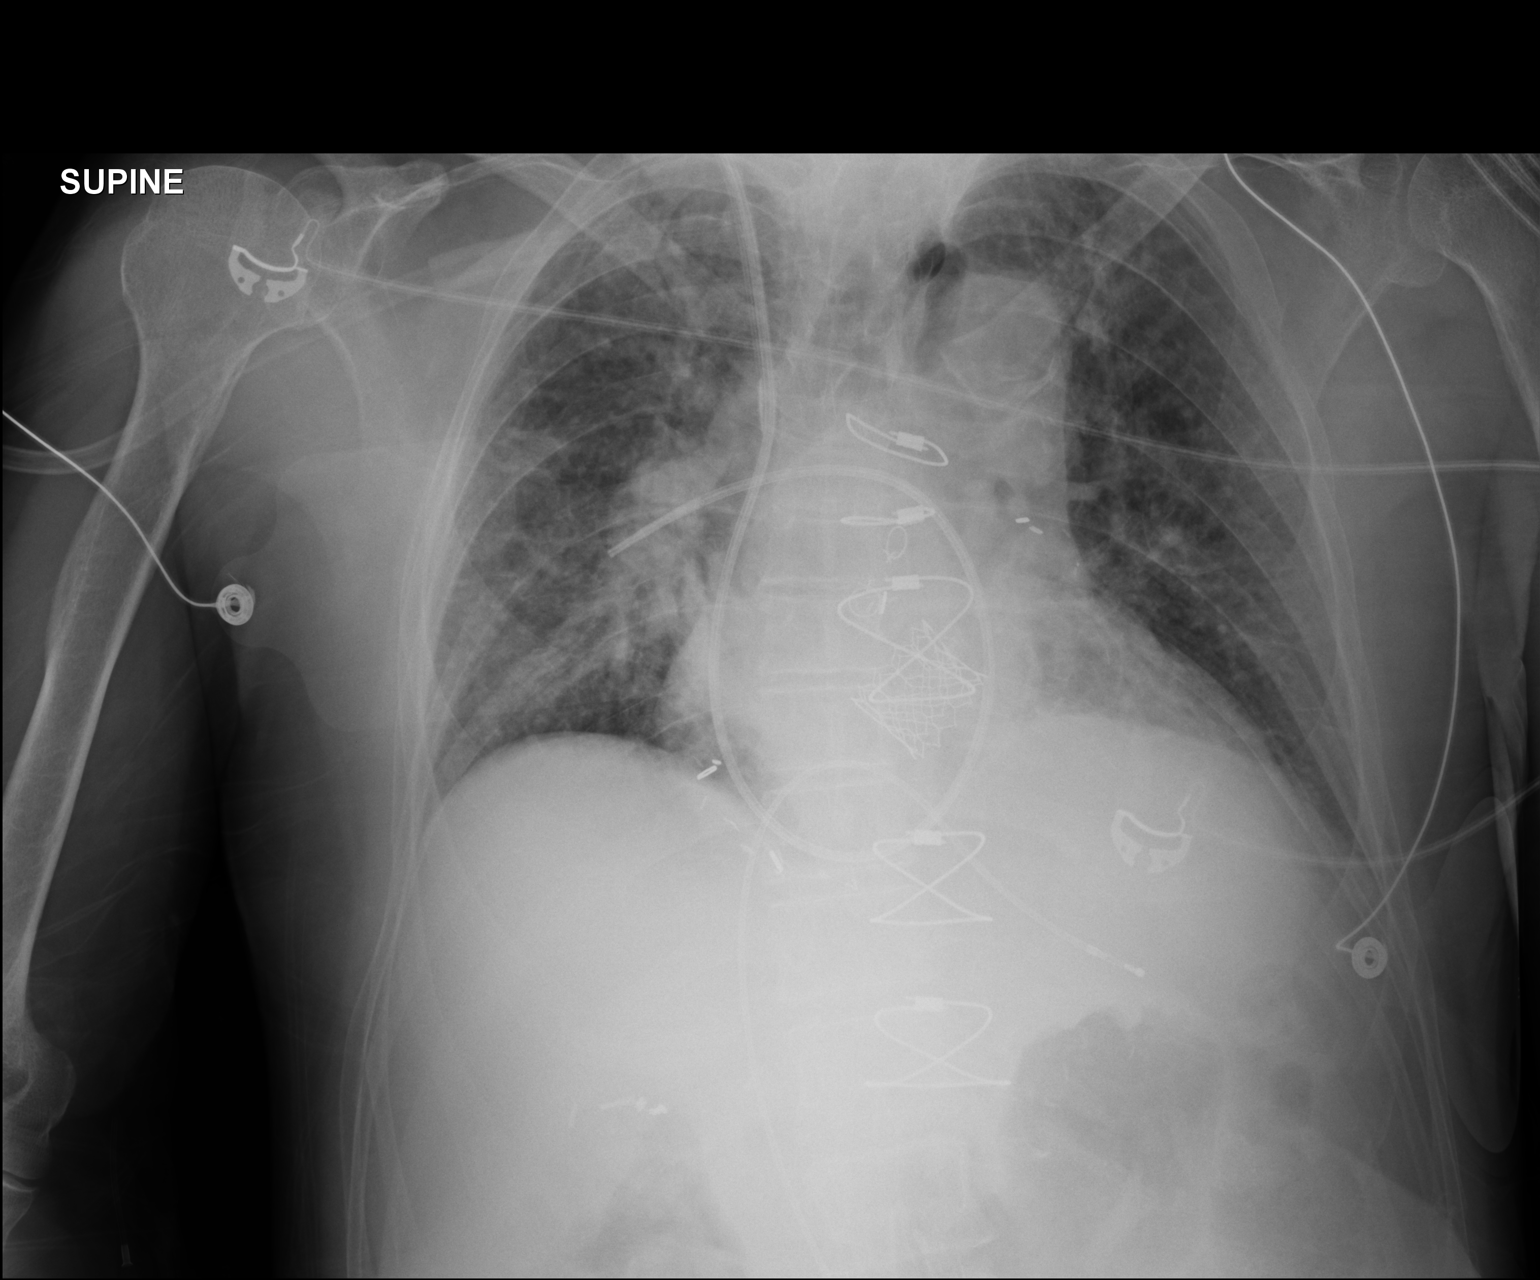

[1 of 1 positions shown; findings below may reference images not displayed]

FINDINGS: Stable cardiomegaly. Status post coronary artery bypass graft. No
pneumothorax or significant pleural effusion is noted. Right
internal jugular Swan-Ganz catheter is noted with distal tip in
right pulmonary artery.
IMPRESSION: Interval placement of right internal jugular Swan-Ganz catheter with
distal tip in right pulmonary artery. No pneumothorax is noted.

## 2015-04-28 IMAGING — CR DG CHEST 1V PORT
1 series · 1 of 1 positions shown · non-contrast
Comparison: March 10, 2015

CLINICAL DATA: Aortic stenosis with transcatheter aortic valve
replacement

EXAM:
PORTABLE CHEST - 1 VIEW

[AP]
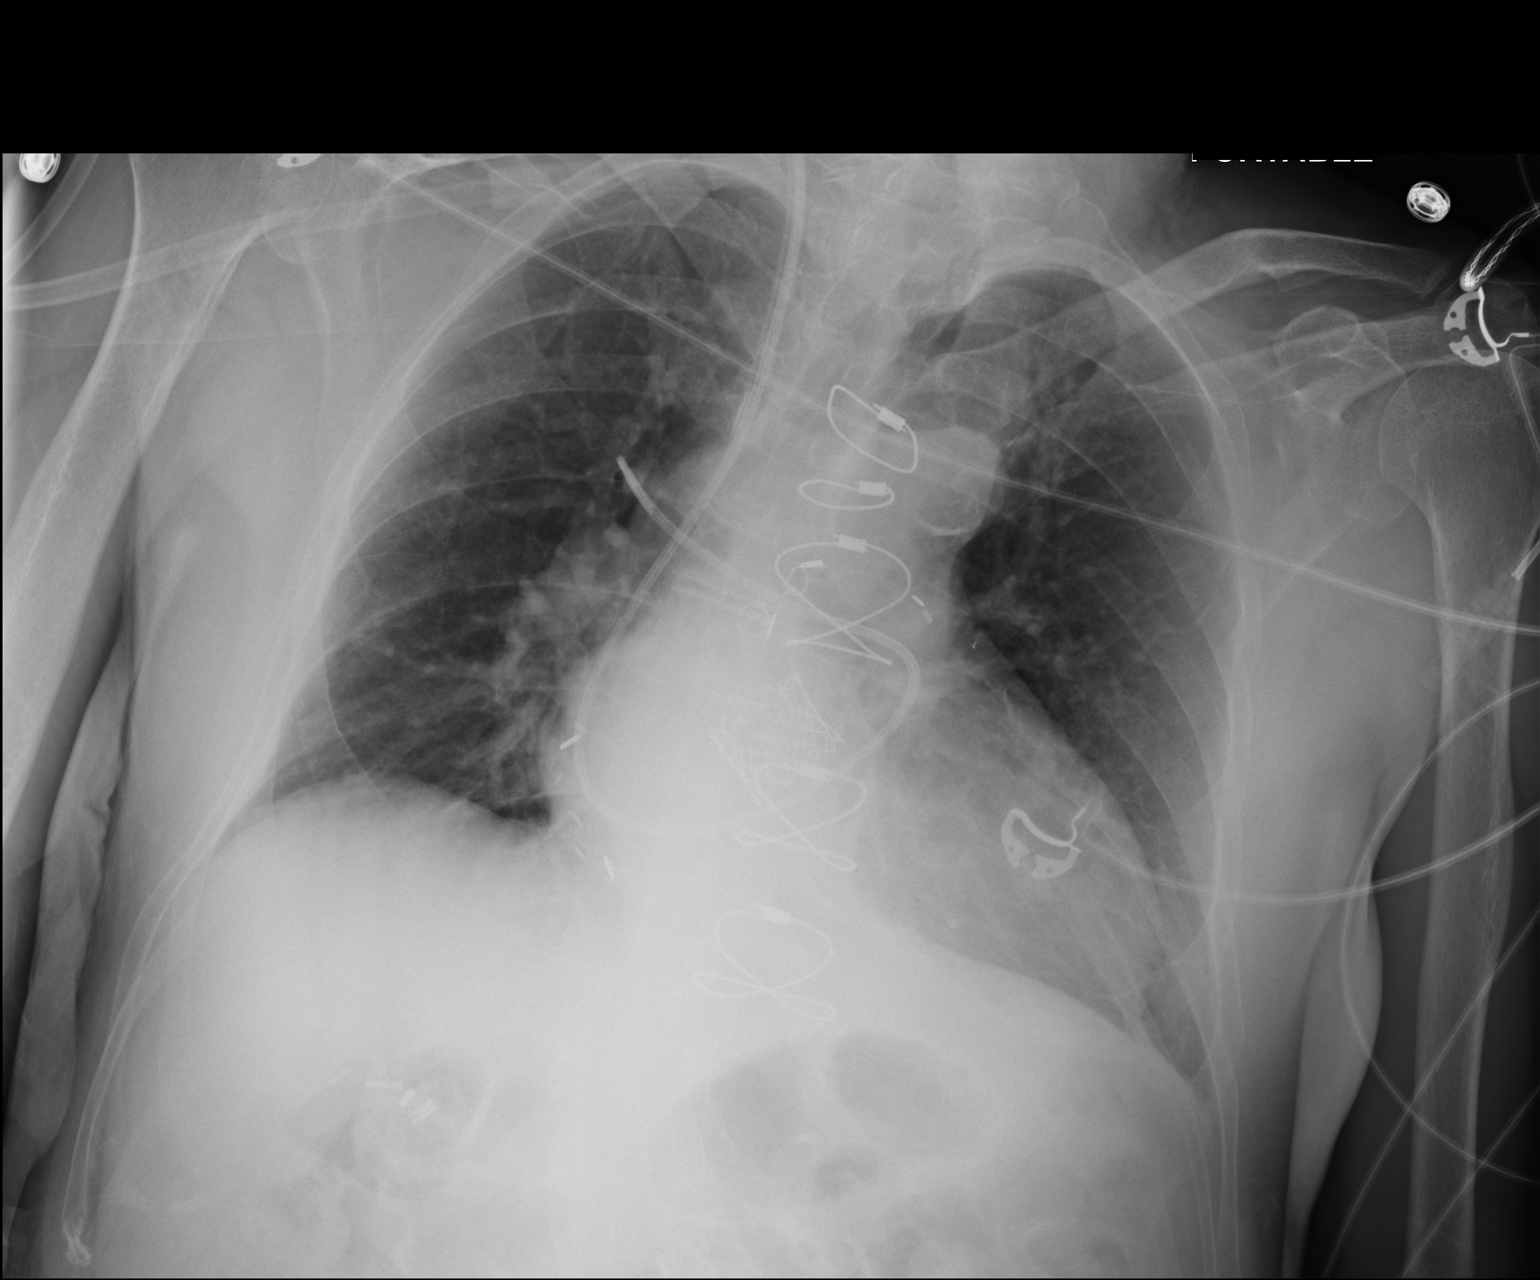

[1 of 1 positions shown; findings below may reference images not displayed]

FINDINGS: Swan-Ganz catheter tip is now in the proximal left upper lobe
pulmonary artery. The previously noted temporary pacemaker lead
placed from a femoral approach is no longer appreciable. No
pneumothorax. There is no edema or consolidation. Heart is mildly
enlarged with pulmonary vascular within normal limits. Patient is
status post coronary artery bypass grafting. Patient is also status
post aortic valve replacement.
IMPRESSION: Swan-Ganz catheter tip in proximal right upper lobe pulmonary
artery. No pneumothorax. No edema or consolidation. No change in
cardiac silhouette. Pacemaker lead placed from femoral location no
longer appreciable.

## 2015-04-30 ENCOUNTER — Encounter: Payer: Self-pay | Admitting: Family Medicine

## 2015-04-30 ENCOUNTER — Ambulatory Visit (INDEPENDENT_AMBULATORY_CARE_PROVIDER_SITE_OTHER): Payer: Medicare Other | Admitting: Family Medicine

## 2015-04-30 VITALS — BP 128/72 | HR 56 | Temp 97.7°F | Wt 136.0 lb

## 2015-04-30 DIAGNOSIS — I359 Nonrheumatic aortic valve disorder, unspecified: Secondary | ICD-10-CM

## 2015-04-30 DIAGNOSIS — I255 Ischemic cardiomyopathy: Secondary | ICD-10-CM

## 2015-04-30 DIAGNOSIS — E785 Hyperlipidemia, unspecified: Secondary | ICD-10-CM | POA: Diagnosis not present

## 2015-04-30 DIAGNOSIS — I10 Essential (primary) hypertension: Secondary | ICD-10-CM | POA: Diagnosis not present

## 2015-04-30 NOTE — Progress Notes (Signed)
Subjective:    Patient ID: Jesus [name], male    DOB: 19-Feb-1941, 74 y.o.   MRN: [id number]  HPI  Patient is here for medical follow-up. He had recent transcatheter aortic valve replacement back in April and that went very well. His past history is that he had reported MI back in 1980 and underwent CABG times 07/23/1996. Had recently had some progressive dyspnea and progressive aortic stenosis by follow-up echocardiograms. His surgery was April 5. He's done extremely well since then. He has not required any cardiac rehabilitation. He is already back to his usual activities for the most part. Good appetite. No recent fevers. He knows to take anabiotic prophylaxis for dental work and has amoxicillin prescribed for upcoming dental cleaning.  Had recent respiratory infection went to urgent care was placed on Zithromax. He is on Coumadin and gets INRs monitored to the Coumadin clinic. He does have history of impaired systolic function but cannot take beta blockers secondary to bradycardia. He is maintained on losartan and amlodipine and blood pressures been well controlled. Takes Lipitor for hyperlipidemia. GERD symptoms are controlled with Protonix.  Past Medical History  Diagnosis Date  . Esophageal reflux   . Atrial fibrillation     holding sinus rhythm on Amiodarone  . Aortic stenosis     mild with a mean aortic valve gradient of 12 mmHg  . CAD (coronary artery disease)     a. S/P Ant MI 1980;  b. 1997 S/P CABG x 8 (LIMA to diag-LAD, SVG to OM1-OM2-OM3, SVG to [facility] - Dr [name]);  c. 01/2015 Cath: 3VD, 8/8 patent grafts.  [name] Unspecified essential hypertension   . History of transesophageal echocardiography (TEE) for monitoring   . Cardiomyopathy   . Other and unspecified hyperlipidemia   . Skin lesions, generalized     facial which may represent actinic keratoses and possible photosensitivity from Amiodarone  . GERD (gastroesophageal reflux disease)   . History of colonoscopy   .  Acute myocardial infarction of inferior wall 1980  . Dysrhythmia   . Headache   . S/P TAVR (transcatheter aortic valve replacement)     a. 03/2015 26 mm Edwards Sapien 3 transcatheter heart valve placed via open left transfemoral approach.   Past Surgical History  Procedure Laterality Date  . Tonsillectomy    . Coronary artery bypass graft  02/09/1996    LIMA to diag-LAD, SVG to OM1-OM2-OM3, SVG to [facility]  . Cholecystectomy    . Cardioversion  11/18/2006    Dr. [name]  . Cataract extraction w/ intraocular lens implant  April '13  (Dr. [name])    left eye only  . Left and right heart catheterization with coronary/graft angiogram N/A 01/26/2015    Procedure: LEFT AND RIGHT HEART CATHETERIZATION WITH [name];  Surgeon: [name], MD;  Location: [facility] CATH LAB;  Service: Cardiovascular;  Laterality: N/A;  . Cardiac catheterization    . Eye surgery    . Transcatheter aortic valve replacement, transfemoral N/A 03/10/2015    Procedure: TRANSCATHETER AORTIC VALVE REPLACEMENT, TRANSFEMORAL;  Surgeon: [name], MD;  Location: [place];  Service: Open Heart Surgery;  Laterality: N/A;  . Tee without cardioversion N/A 03/10/2015    Procedure: TRANSESOPHAGEAL ECHOCARDIOGRAM (TEE);  Surgeon: [name], MD;  Location: [place];  Service: Open Heart Surgery;  Laterality: N/A;    reports that he has never smoked. He has never used smokeless tobacco. He reports that he does not drink alcohol or use illicit drugs. family history  includes Atrial fibrillation in his brother; Crohn's disease in his mother; Heart attack (age of onset: 3) in his father; Heart disease in his father and paternal uncle; Heart failure in his father; Prostate cancer in his paternal uncle; Stroke (age of onset: 42) in his mother. Allergies  Allergen Reactions  . Codeine Other (See Comments)    Pt does not remember  . Iodine Swelling     Review of Systems  Constitutional: Negative for fatigue.    Eyes: Negative for visual disturbance.  Respiratory: Negative for cough, chest tightness and shortness of breath.   Cardiovascular: Negative for chest pain, palpitations and leg swelling.  Gastrointestinal: Negative for abdominal pain.  Endocrine: Negative for polydipsia and polyuria.  Neurological: Negative for dizziness, syncope, weakness, light-headedness and headaches.       Objective:   Physical Exam  Constitutional: He is oriented to person, place, and time. He appears well-developed and well-nourished.  HENT:  Right Ear: External ear normal.  Left Ear: External ear normal.  Mouth/Throat: Oropharynx is clear and moist.  Eyes: Pupils are equal, round, and reactive to light.  Neck: Neck supple. No thyromegaly present.  Cardiovascular: Normal rate and regular rhythm.   Murmur heard. Pulmonary/Chest: Effort normal and breath sounds normal. No respiratory distress. He has no wheezes. He has no rales.  Musculoskeletal: He exhibits no edema.  Neurological: He is alert and oriented to person, place, and time.  Psychiatric: He has a normal mood and affect. His behavior is normal.          Assessment & Plan:  #1 recent transcatheter aortic valve replacement secondary to severe aortic stenosis. Doing extremely well. Symptomatically improved.  Continue close follow up with cardiology. #2 hypertension stable #3 history of systolic dysfunction. Stable. Cannot take beta blockers secondary to relative bradycardia. #4 hyperlipidemia. Lipids were checked back in February through cardiology and been stable on Lipitor

## 2015-04-30 NOTE — Progress Notes (Signed)
Pre visit review using our clinic review tool, if applicable. No additional management support is needed unless otherwise documented below in the visit note. 

## 2015-05-14 DIAGNOSIS — H903 Sensorineural hearing loss, bilateral: Secondary | ICD-10-CM | POA: Diagnosis not present

## 2015-05-19 ENCOUNTER — Ambulatory Visit: Payer: Medicare Other

## 2015-05-20 ENCOUNTER — Ambulatory Visit (INDEPENDENT_AMBULATORY_CARE_PROVIDER_SITE_OTHER): Payer: Medicare Other | Admitting: *Deleted

## 2015-05-20 DIAGNOSIS — Z5181 Encounter for therapeutic drug level monitoring: Secondary | ICD-10-CM | POA: Diagnosis not present

## 2015-05-20 DIAGNOSIS — Z952 Presence of prosthetic heart valve: Secondary | ICD-10-CM

## 2015-05-20 DIAGNOSIS — I482 Chronic atrial fibrillation, unspecified: Secondary | ICD-10-CM

## 2015-05-20 DIAGNOSIS — Z954 Presence of other heart-valve replacement: Secondary | ICD-10-CM

## 2015-05-20 LAB — POCT INR: INR: 2.9

## 2015-05-20 NOTE — Progress Notes (Signed)
Pre visit review using our clinic review tool, if applicable. No additional management support is needed unless otherwise documented below in the visit note. 

## 2015-05-20 NOTE — Progress Notes (Signed)
I have reviewed and agree with the plan. 

## 2015-05-29 ENCOUNTER — Telehealth: Payer: Self-pay | Admitting: *Deleted

## 2015-05-29 MED ORDER — WARFARIN SODIUM 5 MG PO TABS: ORAL_TABLET | ORAL | Status: DC

## 2015-05-29 NOTE — Telephone Encounter (Signed)
Refilled Warfarin Sodium 5mg  Tab #30 to [facility]

## 2015-06-10 ENCOUNTER — Other Ambulatory Visit: Payer: Self-pay | Admitting: Cardiovascular Disease

## 2015-06-11 ENCOUNTER — Telehealth: Payer: Self-pay | Admitting: Internal Medicine

## 2015-06-11 ENCOUNTER — Other Ambulatory Visit: Payer: Self-pay

## 2015-06-11 MED ORDER — AMIODARONE HCL 200 MG PO TABS: 100.0000 mg | ORAL_TABLET | Freq: Every day | ORAL | Status: DC

## 2015-06-11 NOTE — Telephone Encounter (Signed)
Spoke with patient and he states he had bright, red blood on tissue when he wiped today. Denies constipation, hemorrhoids or pain. Patient is on Coumadin. He will call the MD that manages Coumadin. Scheduled with Alonza Bogus, PA on 06/15/15.

## 2015-06-15 ENCOUNTER — Ambulatory Visit (INDEPENDENT_AMBULATORY_CARE_PROVIDER_SITE_OTHER): Payer: Medicare Other | Admitting: Gastroenterology

## 2015-06-15 ENCOUNTER — Encounter: Payer: Self-pay | Admitting: Gastroenterology

## 2015-06-15 VITALS — BP 130/60 | HR 74 | Ht 62.0 in | Wt 129.6 lb

## 2015-06-15 DIAGNOSIS — K648 Other hemorrhoids: Secondary | ICD-10-CM

## 2015-06-15 DIAGNOSIS — K5909 Other constipation: Secondary | ICD-10-CM | POA: Diagnosis not present

## 2015-06-15 DIAGNOSIS — K625 Hemorrhage of anus and rectum: Secondary | ICD-10-CM

## 2015-06-15 MED ORDER — HYDROCORTISONE 2.5 % RE CREA: 1.0000 "application " | TOPICAL_CREAM | Freq: Two times a day (BID) | RECTAL | Status: DC

## 2015-06-15 NOTE — Progress Notes (Signed)
06/15/2015 Jesus [name] [id number] 1941-09-09   HISTORY OF PRESENT ILLNESS:  This is a pleasant 74 year old male who is here today with his wife for complaints of rectal bleeding.  He is known to Dr. [name] for previous procedures and last colonoscopy was in 10/2010 at which time he was noted to have diverticulosis and had one polyp removed from the rectum that was a tubular adenoma; repeat procedure was recommended in 5 years from that time.  He has several chronic medical problems including atrial fibrillation on coumadin, recent aortic valve replacement in 03/2015, CAD s/p CABG, HTN, and HLD.    He and his wife say that he has been constipated for the past 10 days or so, having to strain to pass his stool, which they contribute to a change in his diet.  His wife recently had her gallbladder removed so has not been cooking much and they have been eating out more.  Then, this past Thursday, 4 days ago, the patient started noticing bright red blood when he would move his bowels.  This is seen on the stool and on the toilet paper.  Denies abdominal pain or any rectal pain or discomfort.     Past Medical History  Diagnosis Date  . Esophageal reflux   . Atrial fibrillation     holding sinus rhythm on Amiodarone  . Aortic stenosis     mild with a mean aortic valve gradient of 12 mmHg  . CAD (coronary artery disease)     a. S/P Ant MI 1980;  b. 1997 S/P CABG x 8 (LIMA to diag-LAD, SVG to OM1-OM2-OM3, SVG to [facility] - Dr [name]);  c. 01/2015 Cath: 3VD, 8/8 patent grafts.  [name] Unspecified essential hypertension   . History of transesophageal echocardiography (TEE) for monitoring   . Cardiomyopathy   . Other and unspecified hyperlipidemia   . Skin lesions, generalized     facial which [name] represent actinic keratoses and possible photosensitivity from Amiodarone  . GERD (gastroesophageal reflux disease)   . History of colonoscopy   . Acute myocardial infarction of inferior wall 1980  .  Dysrhythmia   . Headache   . S/P TAVR (transcatheter aortic valve replacement)     a. 03/2015 26 mm Edwards Sapien 3 transcatheter heart valve placed via open left transfemoral approach.   Past Surgical History  Procedure Laterality Date  . Tonsillectomy    . Coronary artery bypass graft  02/09/1996    LIMA to diag-LAD, SVG to OM1-OM2-OM3, SVG to [facility]  . Cholecystectomy    . Cardioversion  11/18/2006    Dr. [name]  . Cataract extraction w/ intraocular lens implant  April '13  (Dr. [name])    left eye only  . Left and right heart catheterization with coronary/graft angiogram N/A 01/26/2015    Procedure: LEFT AND RIGHT HEART CATHETERIZATION WITH [name];  Surgeon: [name], MD;  Location: [place] CATH LAB;  Service: Cardiovascular;  Laterality: N/A;  . Cardiac catheterization    . Eye surgery    . Transcatheter aortic valve replacement, transfemoral N/A 03/10/2015    Procedure: TRANSCATHETER AORTIC VALVE REPLACEMENT, TRANSFEMORAL;  Surgeon: [name], MD;  Location: [place];  Service: Open Heart Surgery;  Laterality: N/A;  . Tee without cardioversion N/A 03/10/2015    Procedure: TRANSESOPHAGEAL ECHOCARDIOGRAM (TEE);  Surgeon: [name], MD;  Location: [place];  Service: Open Heart Surgery;  Laterality: N/A;    reports that he has never smoked. He  has never used smokeless tobacco. He reports that he does not drink alcohol or use illicit drugs. family history includes Atrial fibrillation in his brother; Crohn's disease in his mother; Heart attack (age of onset: 47) in his father; Heart disease in his father and paternal uncle; Heart failure in his father; Prostate cancer in his paternal uncle; Stroke (age of onset: 43) in his mother. Allergies  Allergen Reactions  . Codeine Other (See Comments)    Pt does not remember  . Iodine Swelling      Outpatient Encounter Prescriptions as of 06/15/2015  Medication Sig  . acetaminophen (TYLENOL) 500 MG tablet Take  500 mg by mouth every 6 (six) hours as needed for moderate pain.  [name] amiodarone (PACERONE) 200 MG tablet Take 0.5 tablets (100 mg total) by mouth daily.  [name] amLODipine (NORVASC) 5 MG tablet Take 1 tablet (5 mg total) by mouth daily.  [name] amoxicillin (AMOXIL) 500 MG tablet Take 4 tablets by mouth one hour prior to dental procedure  . Ascorbic Acid (VITAMIN C) 1000 MG tablet Take 1,000 mg by mouth daily.    [name] aspirin 81 MG tablet Take 81 mg by mouth daily.    [name] atorvastatin (LIPITOR) 80 MG tablet Take 0.5 tablets (40 mg total) by mouth daily.  [name] loratadine (CLARITIN) 10 MG tablet Take 10 mg by mouth daily as needed for allergies.   [name] losartan (COZAAR) 100 MG tablet Take 1 tablet (100 mg total) by mouth daily.  . Multiple Vitamins-Minerals (MULTIVITAMIN,TX-MINERALS) tablet Take 1 tablet by mouth daily.    . pantoprazole (PROTONIX) 40 MG tablet Take 1 tablet (40 mg total) by mouth daily.  . traMADol (ULTRAM) 50 MG tablet Take 1-2 tablets (50-100 mg total) by mouth every 4 (four) hours as needed for moderate pain.  [name] warfarin (COUMADIN) 5 MG tablet Take as directed by coumadin clinic.  . hydrocortisone (PROCTOSOL HC) 2.5 % rectal cream Place 1 application rectally 2 (two) times daily. X 10 days   No facility-administered encounter medications on file as of 06/15/2015.     REVIEW OF SYSTEMS  : All other systems reviewed and negative except where noted in the History of Present Illness.   PHYSICAL EXAM: BP 130/60 mmHg  Pulse 74  Ht 5\' 2"  (1.575 m)  Wt 129 lb 9.6 oz (58.786 kg)  BMI 23.70 kg/m2  SpO2 98% General: Well developed white male in no acute distress Head: Normocephalic and atraumatic Eyes:  Sclerae anicteric, conjunctiva pink. Ears: Normal auditory acuity Lungs: Clear throughout to auscultation Heart: Regular rate and rhythm.  Murmur noted from mechanical valve. Abdomen: Soft, non-distended.  Normal bowel sounds.  Non-tender. Rectal:  Some dried blood noted around anus.  No external  hemorrhoids noted.  DRE revealed soft stool in rectal vault.  No masses noted and no tenderness on exam.  Light brown stool noted on glove without blood but was heme positive.  Anoscopy revealed bleeding internal hemorrhoid about 2 o'clock position.   Musculoskeletal: Symmetrical with no gross deformities  Skin: No lesions on visible extremities Extremities: No edema  Neurological: Alert oriented x 4, grossly non-focal Psychological:  Alert and cooperative. Normal mood and affect  ASSESSMENT AND PLAN: -Rectal bleeding:  Secondary to internal hemorrhoid noted on exam.  Will give hydrocortisone suppository to use twice daily for ten days. -Constipation:  Will start Miralax daily.  *Follow-up in 2-4 weeks.    CC:  [name], MD

## 2015-06-15 NOTE — Patient Instructions (Signed)
We have sent medications to your pharmacy for you to pick up at your convenience. Follow up on 07/01/15 at 10:30 am with Alonza Bogus PA Start taking miralax daily.

## 2015-06-17 NOTE — Progress Notes (Signed)
Reviewed and agree with tyreating anal source of bleeding. He will be due for colon recall  Oct 2016.

## 2015-06-29 ENCOUNTER — Other Ambulatory Visit: Payer: Self-pay | Admitting: Cardiovascular Disease

## 2015-06-30 ENCOUNTER — Other Ambulatory Visit: Payer: Self-pay

## 2015-06-30 MED ORDER — AMLODIPINE BESYLATE 5 MG PO TABS: 5.0000 mg | ORAL_TABLET | Freq: Every day | ORAL | Status: DC

## 2015-07-01 ENCOUNTER — Ambulatory Visit: Payer: Medicare Other

## 2015-07-01 ENCOUNTER — Ambulatory Visit (INDEPENDENT_AMBULATORY_CARE_PROVIDER_SITE_OTHER): Payer: Medicare Other | Admitting: Gastroenterology

## 2015-07-01 ENCOUNTER — Encounter: Payer: Self-pay | Admitting: Gastroenterology

## 2015-07-01 ENCOUNTER — Ambulatory Visit (INDEPENDENT_AMBULATORY_CARE_PROVIDER_SITE_OTHER): Payer: Medicare Other | Admitting: General Practice

## 2015-07-01 VITALS — BP 128/60 | HR 60 | Ht 62.0 in | Wt 129.0 lb

## 2015-07-01 DIAGNOSIS — I482 Chronic atrial fibrillation, unspecified: Secondary | ICD-10-CM

## 2015-07-01 DIAGNOSIS — Z5181 Encounter for therapeutic drug level monitoring: Secondary | ICD-10-CM | POA: Diagnosis not present

## 2015-07-01 DIAGNOSIS — Z952 Presence of prosthetic heart valve: Secondary | ICD-10-CM

## 2015-07-01 DIAGNOSIS — K648 Other hemorrhoids: Secondary | ICD-10-CM | POA: Diagnosis not present

## 2015-07-01 DIAGNOSIS — K625 Hemorrhage of anus and rectum: Secondary | ICD-10-CM

## 2015-07-01 DIAGNOSIS — Z954 Presence of other heart-valve replacement: Secondary | ICD-10-CM | POA: Diagnosis not present

## 2015-07-01 LAB — POCT INR: INR: 2.2

## 2015-07-01 MED ORDER — HYDROCORTISONE 2.5 % RE CREA: 1.0000 "application " | TOPICAL_CREAM | Freq: Two times a day (BID) | RECTAL | Status: DC

## 2015-07-01 NOTE — Progress Notes (Signed)
I have reviewed and agree with the plan. 

## 2015-07-01 NOTE — Progress Notes (Signed)
Pre visit review using our clinic review tool, if applicable. No additional management support is needed unless otherwise documented below in the visit note. 

## 2015-07-01 NOTE — Patient Instructions (Signed)
We have sent medications to your pharmacy for you to pick up at your convenience   

## 2015-07-01 NOTE — Progress Notes (Signed)
     07/01/2015 [name] [id number] 12/16/1940   History of Present Illness:  This is a pleasant 74 year old male who is known to Dr. [name].  Was seen here on 7/11 for rectal bleeding.  Clear source from internal hemorrhoid was identified on anoscopy at that time.  Was treated with topical hydrocortisone BID for ten days.  He is here today for follow-up.  He says that the bleeding resolved for about 10 days or so, but then yesterday he saw a small amount of blood again when he had a BM.  He had another BM this morning and did not see any blood then, however.  Last colonoscopy was in 11/03/2010 at which time he was noted to have diverticulosis and had one polyp removed from the rectum that was a tubular adenoma; repeat procedure was recommended in 5 years from that time.  He has several chronic medical problems including atrial fibrillation on coumadin, recent aortic valve replacement in 03/2015, CAD s/p CABG, HTN, and HLD.     Current Medications, Allergies, Past Medical History, Past Surgical History, Family History and Social History were reviewed in [employer] record.   Physical Exam: BP 128/60 mmHg  Pulse 60  Ht 5\' 2"  (1.575 m)  Wt 129 lb (58.514 kg)  BMI 23.59 kg/m2 General: Well developed white male in no acute distress Head: Normocephalic and atraumatic Eyes:  sclerae anicteric, conjunctiva pink  Ears: Normal auditory acuity Musculoskeletal: Symmetrical with no gross deformities  Extremities: No edema  Neurological: Alert oriented x 4, grossly non-focal Psychological:  Alert and cooperative. Normal mood and affect  Assessment and Recommendations: -Rectal bleeding, secondary to internal hemorrhoid:  Resolved with topical hydrocortisone treatment.  Will repeat treatment twice daily for 10 more days. -Chronic anticoagulation for atrial fibrillation  *Due for colonoscopy recall 11/2015.

## 2015-07-02 NOTE — Progress Notes (Signed)
Reviewed and agree.

## 2015-07-06 ENCOUNTER — Telehealth: Payer: Self-pay | Admitting: Cardiovascular Disease

## 2015-07-06 NOTE — Telephone Encounter (Signed)
New message     Pt is wanting to check to make sure it is okay for him to take miralax since he has a heart valve. Please call to discuss After 12pm please call [phone]

## 2015-07-06 NOTE — Telephone Encounter (Signed)
Called patient after discussing with pharmacist about heart valve surgery and taking miralax.   Patient had seen something on TV about not taking after heart valve surgery.    Instructed patient that it was ok to take after heart valve surgery

## 2015-07-15 ENCOUNTER — Telehealth: Payer: Self-pay | Admitting: Family Medicine

## 2015-07-15 MED ORDER — LOSARTAN POTASSIUM 100 MG PO TABS: 100.0000 mg | ORAL_TABLET | Freq: Every day | ORAL | Status: DC

## 2015-07-15 NOTE — Telephone Encounter (Signed)
Rx sent to pharmacy   

## 2015-07-15 NOTE — Telephone Encounter (Signed)
Refill request for Losartan 100 mg take 1 po qd and send to [facility].

## 2015-07-29 ENCOUNTER — Ambulatory Visit: Payer: Medicare Other

## 2015-08-12 ENCOUNTER — Ambulatory Visit (INDEPENDENT_AMBULATORY_CARE_PROVIDER_SITE_OTHER): Payer: Medicare Other | Admitting: General Practice

## 2015-08-12 DIAGNOSIS — I482 Chronic atrial fibrillation, unspecified: Secondary | ICD-10-CM

## 2015-08-12 DIAGNOSIS — Z5181 Encounter for therapeutic drug level monitoring: Secondary | ICD-10-CM | POA: Diagnosis not present

## 2015-08-12 DIAGNOSIS — Z954 Presence of other heart-valve replacement: Secondary | ICD-10-CM

## 2015-08-12 DIAGNOSIS — Z952 Presence of prosthetic heart valve: Secondary | ICD-10-CM

## 2015-08-12 DIAGNOSIS — Z23 Encounter for immunization: Secondary | ICD-10-CM | POA: Diagnosis not present

## 2015-08-12 LAB — POCT INR: INR: 2.9

## 2015-08-12 NOTE — Progress Notes (Signed)
Pre visit review using our clinic review tool, if applicable. No additional management support is needed unless otherwise documented below in the visit note. 

## 2015-08-12 NOTE — Progress Notes (Signed)
I have reviewed and agree with the plan. 

## 2015-08-13 ENCOUNTER — Other Ambulatory Visit: Payer: Self-pay | Admitting: *Deleted

## 2015-08-13 MED ORDER — PANTOPRAZOLE SODIUM 40 MG PO TBEC: 40.0000 mg | DELAYED_RELEASE_TABLET | Freq: Every day | ORAL | Status: DC

## 2015-08-13 NOTE — Telephone Encounter (Signed)
Rx done. 

## 2015-09-23 ENCOUNTER — Ambulatory Visit (INDEPENDENT_AMBULATORY_CARE_PROVIDER_SITE_OTHER): Payer: Medicare Other | Admitting: General Practice

## 2015-09-23 DIAGNOSIS — Z954 Presence of other heart-valve replacement: Secondary | ICD-10-CM

## 2015-09-23 DIAGNOSIS — Z5181 Encounter for therapeutic drug level monitoring: Secondary | ICD-10-CM

## 2015-09-23 DIAGNOSIS — I482 Chronic atrial fibrillation, unspecified: Secondary | ICD-10-CM

## 2015-09-23 DIAGNOSIS — Z952 Presence of prosthetic heart valve: Secondary | ICD-10-CM

## 2015-09-23 LAB — POCT INR: INR: 3.2

## 2015-09-23 NOTE — Progress Notes (Signed)
I have reviewed and agree with the plan. 

## 2015-09-23 NOTE — Progress Notes (Signed)
Pre visit review using our clinic review tool, if applicable. No additional management support is needed unless otherwise documented below in the visit note. 

## 2015-09-30 ENCOUNTER — Encounter: Payer: Self-pay | Admitting: Family Medicine

## 2015-09-30 ENCOUNTER — Ambulatory Visit (INDEPENDENT_AMBULATORY_CARE_PROVIDER_SITE_OTHER): Payer: Medicare Other | Admitting: Family Medicine

## 2015-09-30 VITALS — BP 110/60 | HR 53 | Temp 97.7°F | Wt 136.5 lb

## 2015-09-30 DIAGNOSIS — H938X3 Other specified disorders of ear, bilateral: Secondary | ICD-10-CM

## 2015-09-30 DIAGNOSIS — H6123 Impacted cerumen, bilateral: Secondary | ICD-10-CM

## 2015-09-30 NOTE — Progress Notes (Signed)
Pre visit review using our clinic review tool, if applicable. No additional management support is needed unless otherwise documented below in the visit note. 

## 2015-09-30 NOTE — Progress Notes (Signed)
Subjective:    Patient ID: Jesus [name], male    DOB: 06/17/1941, 74 y.o.   MRN: [id number]  HPI  Patient seen with bilateral ear congestion.   He is concerned about possible wax buildup.   He has bilateral hearing aids. No ear drainage. No nasal congestion. Denies ear pain No fever or chills.  Past Medical History  Diagnosis Date  . Esophageal reflux   . Atrial fibrillation (HCC)     holding sinus rhythm on Amiodarone  . Aortic stenosis     mild with a mean aortic valve gradient of 12 mmHg  . CAD (coronary artery disease)     a. S/P Ant MI 1980;  b. 1997 S/P CABG x 8 (LIMA to diag-LAD, SVG to OM1-OM2-OM3, SVG to [facility] - Dr [name]);  c. 01/2015 Cath: 3VD, 8/8 patent grafts.  [name] Unspecified essential hypertension   . History of transesophageal echocardiography (TEE) for monitoring   . Cardiomyopathy   . Other and unspecified hyperlipidemia   . Skin lesions, generalized     facial which may represent actinic keratoses and possible photosensitivity from Amiodarone  . GERD (gastroesophageal reflux disease)   . History of colonoscopy   . Acute myocardial infarction of inferior wall ([place]) 1980  . Dysrhythmia   . Headache   . S/P TAVR (transcatheter aortic valve replacement)     a. 03/2015 26 mm Edwards Sapien 3 transcatheter heart valve placed via open left transfemoral approach.   Past Surgical History  Procedure Laterality Date  . Tonsillectomy    . Coronary artery bypass graft  02/09/1996    LIMA to diag-LAD, SVG to OM1-OM2-OM3, SVG to [facility]  . Cholecystectomy    . Cardioversion  11/18/2006    Dr. [name]  . Cataract extraction w/ intraocular lens implant  April '13  (Dr. [name])    left eye only  . Left and right heart catheterization with coronary/graft angiogram N/A 01/26/2015    Procedure: LEFT AND RIGHT HEART CATHETERIZATION WITH [name];  Surgeon: [name], MD;  Location: [facility] CATH LAB;  Service: Cardiovascular;  Laterality: N/A;    . Cardiac catheterization    . Eye surgery    . Transcatheter aortic valve replacement, transfemoral N/A 03/10/2015    Procedure: TRANSCATHETER AORTIC VALVE REPLACEMENT, TRANSFEMORAL;  Surgeon: [name], MD;  Location: [place];  Service: Open Heart Surgery;  Laterality: N/A;  . Tee without cardioversion N/A 03/10/2015    Procedure: TRANSESOPHAGEAL ECHOCARDIOGRAM (TEE);  Surgeon: [name], MD;  Location: [place];  Service: Open Heart Surgery;  Laterality: N/A;    reports that he has never smoked. He has never used smokeless tobacco. He reports that he does not drink alcohol or use illicit drugs. family history includes Atrial fibrillation in his brother; Crohn's disease in his mother; Heart attack (age of onset: 62) in his father; Heart disease in his father and paternal uncle; Heart failure in his father; Prostate cancer in his paternal uncle; Stroke (age of onset: 44) in his mother. Allergies  Allergen Reactions  . Codeine Other (See Comments)    Pt does not remember  . Iodine Swelling      Review of Systems  Constitutional: Negative for fever and chills.  HENT: Positive for hearing loss (chronic). Negative for ear discharge and ear pain.   Respiratory: Negative for shortness of breath.   Cardiovascular: Negative for chest pain.       Objective:   Physical Exam  Constitutional: He appears well-developed and  well-nourished.  HENT:  Mouth/Throat: Oropharynx is clear and moist.  He has some cerumen in both canals but not fully obstructing. Using a curette we were able to remove this without difficulty          Assessment & Plan:  #1 chronic hearing loss. Followed by audiology.  C/o "ear congestion"- no evidence for effusion.  #2 cerumen both canals. Not fully impacted-but partial. Removed with curette without difficulty.

## 2015-10-03 ENCOUNTER — Other Ambulatory Visit: Payer: Self-pay | Admitting: Family Medicine

## 2015-10-06 ENCOUNTER — Telehealth: Payer: Self-pay | Admitting: Cardiovascular Disease

## 2015-10-06 NOTE — Telephone Encounter (Signed)
Message left on Ashley's identified voicemail that Dr. Antionette Char note from May 2016 indicates pt has ischemic cardiomyopathy/chronic systolic heart failure.  Left message to call back if additional questions.

## 2015-10-06 NOTE — Telephone Encounter (Signed)
New message     [employer] calling need a confirm diagnosis of CHF in order for patient to be a part of CHF  program.

## 2015-10-09 ENCOUNTER — Encounter: Payer: Self-pay | Admitting: Gastroenterology

## 2015-10-12 ENCOUNTER — Telehealth: Payer: Self-pay | Admitting: Cardiovascular Disease

## 2015-10-12 NOTE — Telephone Encounter (Signed)
New message  Pt calling to speak w/ Rn concerning labwork sched for 11/21. Pt stated can call back tomorrow- no emergency. Please call back and discuss.

## 2015-10-12 NOTE — Telephone Encounter (Signed)
Spoke with pt who is asking if he can have upcoming lab work done at [facility].  I told pt we could no longer send patient's to this office for lab work.  Pt will come to our office for lab work as planned. He also reports [employer] has sent him information indicating they will no longer cover visits with Dr. [name].  Pt reports he also has Medicare.  I told pt I would have our billing department contact him to discuss insurance coverage.

## 2015-10-13 NOTE — Telephone Encounter (Signed)
I spoke with the pt's wife and made her aware that the pt does not need to find another cardiologist. Insurance issues are being addressed.

## 2015-10-26 ENCOUNTER — Other Ambulatory Visit (INDEPENDENT_AMBULATORY_CARE_PROVIDER_SITE_OTHER): Payer: Medicare Other | Admitting: *Deleted

## 2015-10-26 DIAGNOSIS — I350 Nonrheumatic aortic (valve) stenosis: Secondary | ICD-10-CM

## 2015-10-26 DIAGNOSIS — Z952 Presence of prosthetic heart valve: Secondary | ICD-10-CM

## 2015-10-26 DIAGNOSIS — Z954 Presence of other heart-valve replacement: Secondary | ICD-10-CM | POA: Diagnosis not present

## 2015-10-26 DIAGNOSIS — E0590 Thyrotoxicosis, unspecified without thyrotoxic crisis or storm: Secondary | ICD-10-CM | POA: Diagnosis not present

## 2015-10-26 LAB — HEPATIC FUNCTION PANEL
ALBUMIN: 3.9 g/dL (ref 3.6–5.1)
ALK PHOS: 108 U/L (ref 40–115)
ALT: 20 U/L (ref 9–46)
AST: 22 U/L (ref 10–35)
BILIRUBIN TOTAL: 0.9 mg/dL (ref 0.2–1.2)
Bilirubin, Direct: 0.1 mg/dL (ref <=0.2)
Indirect Bilirubin: 0.8 mg/dL (ref 0.2–1.2)
TOTAL PROTEIN: 6.2 g/dL (ref 6.1–8.1)

## 2015-10-26 LAB — TSH: TSH: 3.228 u[IU]/mL (ref 0.350–4.500)

## 2015-10-26 LAB — BASIC METABOLIC PANEL
BUN: 14 mg/dL (ref 7–25)
CALCIUM: 9 mg/dL (ref 8.6–10.3)
CO2: 27 mmol/L (ref 20–31)
Chloride: 104 mmol/L (ref 98–110)
Creat: 0.77 mg/dL (ref 0.70–1.18)
GLUCOSE: 83 mg/dL (ref 65–99)
POTASSIUM: 4 mmol/L (ref 3.5–5.3)
SODIUM: 139 mmol/L (ref 135–146)

## 2015-10-26 NOTE — Addendum Note (Signed)
Addended by: Eulis Foster on: 10/26/2015 01:28 PM   Modules accepted: Orders

## 2015-10-26 NOTE — Addendum Note (Signed)
Addended by: Eulis Foster on: 10/26/2015 01:27 PM   Modules accepted: Orders

## 2015-10-27 ENCOUNTER — Ambulatory Visit (INDEPENDENT_AMBULATORY_CARE_PROVIDER_SITE_OTHER): Payer: Medicare Other | Admitting: General Practice

## 2015-10-27 ENCOUNTER — Other Ambulatory Visit: Payer: Self-pay | Admitting: General Practice

## 2015-10-27 DIAGNOSIS — I482 Chronic atrial fibrillation, unspecified: Secondary | ICD-10-CM

## 2015-10-27 DIAGNOSIS — Z954 Presence of other heart-valve replacement: Secondary | ICD-10-CM | POA: Diagnosis not present

## 2015-10-27 DIAGNOSIS — Z5181 Encounter for therapeutic drug level monitoring: Secondary | ICD-10-CM | POA: Diagnosis not present

## 2015-10-27 DIAGNOSIS — Z952 Presence of prosthetic heart valve: Secondary | ICD-10-CM

## 2015-10-27 LAB — POCT INR: INR: 2.6

## 2015-10-27 MED ORDER — WARFARIN SODIUM 5 MG PO TABS: ORAL_TABLET | ORAL | Status: DC

## 2015-10-27 NOTE — Progress Notes (Signed)
I have reviewed and agree with the plan. 

## 2015-10-27 NOTE — Progress Notes (Signed)
Pre visit review using our clinic review tool, if applicable. No additional management support is needed unless otherwise documented below in the visit note. 

## 2015-11-02 ENCOUNTER — Ambulatory Visit: Payer: Medicare Other

## 2015-11-02 ENCOUNTER — Encounter: Payer: Self-pay | Admitting: Cardiovascular Disease

## 2015-11-02 ENCOUNTER — Ambulatory Visit (INDEPENDENT_AMBULATORY_CARE_PROVIDER_SITE_OTHER): Payer: Medicare Other | Admitting: Cardiovascular Disease

## 2015-11-02 VITALS — BP 150/72 | HR 56 | Ht 62.0 in | Wt 135.0 lb

## 2015-11-02 DIAGNOSIS — I359 Nonrheumatic aortic valve disorder, unspecified: Secondary | ICD-10-CM

## 2015-11-02 DIAGNOSIS — I482 Chronic atrial fibrillation, unspecified: Secondary | ICD-10-CM

## 2015-11-02 DIAGNOSIS — Z952 Presence of prosthetic heart valve: Secondary | ICD-10-CM

## 2015-11-02 DIAGNOSIS — I255 Ischemic cardiomyopathy: Secondary | ICD-10-CM

## 2015-11-02 DIAGNOSIS — Z954 Presence of other heart-valve replacement: Secondary | ICD-10-CM | POA: Diagnosis not present

## 2015-11-02 NOTE — Progress Notes (Signed)
Cardiology Office Note Date:  11/02/2015   ID:  Jesus [name], DOB 1941/06/21, MRN [id number]  PCP:  [name], MD  Cardiologist:  [name], MD    Chief Complaint  Patient presents with  . Follow-up    aortic valve disease    History of Present Illness: Jesus [name] is a 74 y.o. male who presents for follow-up evaluation. 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 He underwent CABG in 1997. He had developed progressive and severe symptomatic aortic stenosis, and was treated with TAVR on 03/10/2015 via a transfemoral approach. He was treated with a 26 mm Sapien 3 transcatheter heart valve. The patient has had mild to moderate segmental LV dysfunction related to his old MI with an LVEF in the range of 40%.  He has done remarkably well since TAVR. He is physically active with no exertional symptoms. His breathing is dramatically improved. He specifically denies chest pain, chest pressure, or shortness of breath. His only complaint is easy bruising. He denies leg swelling, orthopnea, or PND.  Past Medical History  Diagnosis Date  . Esophageal reflux   . Atrial fibrillation (HCC)     holding sinus rhythm on Amiodarone  . Aortic stenosis     mild with a mean aortic valve gradient of 12 mmHg  . CAD (coronary artery disease)     a. S/P Ant MI 1980;  b. 1997 S/P CABG x 8 (LIMA to diag-LAD, SVG to OM1-OM2-OM3, SVG to [facility] - Dr [name]);  c. 01/2015 Cath: 3VD, 8/8 patent grafts.  [name] Unspecified essential hypertension   . History of transesophageal echocardiography (TEE) for monitoring   . Cardiomyopathy   . Other and unspecified hyperlipidemia   . Skin lesions, generalized     facial which may represent actinic keratoses and possible photosensitivity from Amiodarone  . GERD (gastroesophageal reflux disease)   . History of colonoscopy   . Acute myocardial infarction of inferior wall ([place]) 1980  . Dysrhythmia   . Headache     . S/P TAVR (transcatheter aortic valve replacement)     a. 03/2015 26 mm Edwards Sapien 3 transcatheter heart valve placed via open left transfemoral approach.    Past Surgical History  Procedure Laterality Date  . Tonsillectomy    . Coronary artery bypass graft  02/09/1996    LIMA to diag-LAD, SVG to OM1-OM2-OM3, SVG to [facility]  . Cholecystectomy    . Cardioversion  11/18/2006    Dr. [name]  . Cataract extraction w/ intraocular lens implant  April '13  (Dr. [name])    left eye only  . Left and right heart catheterization with coronary/graft angiogram N/A 01/26/2015    Procedure: LEFT AND RIGHT HEART CATHETERIZATION WITH [name];  Surgeon: [name], MD;  Location: [facility] CATH LAB;  Service: Cardiovascular;  Laterality: N/A;  . Cardiac catheterization    . Eye surgery    . Transcatheter aortic valve replacement, transfemoral N/A 03/10/2015    Procedure: TRANSCATHETER AORTIC VALVE REPLACEMENT, TRANSFEMORAL;  Surgeon: [name], MD;  Location: [place];  Service: Open Heart Surgery;  Laterality: N/A;  . Tee without cardioversion N/A 03/10/2015    Procedure: TRANSESOPHAGEAL ECHOCARDIOGRAM (TEE);  Surgeon: [name], MD;  Location: [place];  Service: Open Heart Surgery;  Laterality: N/A;    Current Outpatient Prescriptions  Medication Sig Dispense Refill  . acetaminophen (TYLENOL) 500 MG tablet Take 500 mg by mouth every 6 (six) hours as needed for  moderate pain.    [name] amiodarone (PACERONE) 200 MG tablet Take 0.5 tablets (100 mg total) by mouth daily. 30 tablet 3  . amLODipine (NORVASC) 5 MG tablet Take 1 tablet (5 mg total) by mouth daily. 30 tablet 11  . Ascorbic Acid (VITAMIN C) 1000 MG tablet Take 1,000 mg by mouth daily.      [name] aspirin 81 MG tablet Take 81 mg by mouth daily.      [name] atorvastatin (LIPITOR) 80 MG tablet TAKE 1/2 TABLET BY MOUTH DAILY 45 tablet 3  . loratadine (CLARITIN) 10 MG tablet Take 10 mg by mouth daily as needed for allergies.     [name]  losartan (COZAAR) 100 MG tablet Take 1 tablet (100 mg total) by mouth daily. 30 tablet 5  . Multiple Vitamins-Minerals (MULTIVITAMIN,TX-MINERALS) tablet Take 1 tablet by mouth daily.      . pantoprazole (PROTONIX) 40 MG tablet Take 1 tablet (40 mg total) by mouth daily. 90 tablet 0  . traMADol (ULTRAM) 50 MG tablet Take 1-2 tablets (50-100 mg total) by mouth every 4 (four) hours as needed for moderate pain. 30 tablet 0  . warfarin (COUMADIN) 5 MG tablet Take as directed by coumadin clinic. 30 tablet 3   No current facility-administered medications for this visit.    Allergies:   Codeine and Iodine   Social History:  The patient  reports that he has never smoked. He has never used smokeless tobacco. He reports that he does not drink alcohol or use illicit drugs.   Family History:  The patient's  family history includes Atrial fibrillation in his brother; Crohn's disease in his mother; Heart attack (age of onset: 28) in his father; Heart disease in his father and paternal uncle; Heart failure in his father; Prostate cancer in his paternal uncle; Stroke (age of onset: 70) in his mother.   ROS:  Please see the history of present illness.  Otherwise, review of systems is positive for  Hearing loss, back pain, easy bruising, snoring.  All other systems are reviewed and negative.   PHYSICAL EXAM: VS:  BP 150/72 mmHg  Pulse 56  Ht 5\' 2"  (1.575 m)  Wt 135 lb (61.236 kg)  BMI 24.69 kg/m2 , BMI Body mass index is 24.69 kg/(m^2). GEN: Well nourished, well developed, in no acute distress HEENT: normal Neck: no JVD, no masses. No carotid bruits Cardiac: RRR with 2/6 early-peaking SEM at the RUSB and 2/6 diastolic decrescendo murmur at the LSB          Respiratory:  clear to auscultation bilaterally, normal work of breathing GI: soft, nontender, nondistended, + BS MS: no deformity or atrophy Ext: no pretibial edema, pedal pulses 2+= bilaterally Skin: warm and dry, no rash Neuro:  Strength and  sensation are intact Psych: euthymic mood, full affect  EKG:  EKG is ordered today. The ekg ordered today shows Sinus bradycardia 56 bpm, age-indeterminate inferolateral MI, otherwise within normal limits  Recent Labs: 01/21/2015: Pro B Natriuretic peptide (BNP) 339.0* 03/11/2015: Magnesium 1.7 03/12/2015: Hemoglobin 11.0*; Platelets 85* 10/26/2015: ALT 20; BUN 14; Creat 0.77; Potassium 4.0; Sodium 139; TSH 3.228   Lipid Panel     Component Value Date/Time   CHOL 149 01/21/2015 0827   TRIG 99.0 01/21/2015 0827   TRIG 67 11/20/2006 0748   HDL 49.40 01/21/2015 0827   CHOLHDL 3 01/21/2015 0827   CHOLHDL 3.2 CALC 11/20/2006 0748   VLDL 19.8 01/21/2015 0827   LDLCALC 80 01/21/2015 0827  Wt Readings from Last 3 Encounters:  11/02/15 135 lb (61.236 kg)  09/30/15 136 lb 8 oz (61.916 kg)  07/01/15 129 lb (58.514 kg)     Cardiac Studies Reviewed: Echo 04/09/2015: Left ventricle: LVEF is approximately 35 to 40% with severe hypokinesis/ akinesis of the base/mid inferior wall and mid/distal posterior wall The cavity size was normal. Wall thickness was normal.  ------------------------------------------------------------------- Aortic valve: AV prosthesis opens well. Peak and mean gradients through the valve are 24 and 16 mm Hg respectively. Doppler: There was no significant regurgitation.  VTI ratio of LVOT to aortic valve: 0.49. Mean velocity ratio of LVOT to aortic valve: 0.48.  Mean gradient (S): 14 mm Hg.  ------------------------------------------------------------------- Mitral valve:  Calcified annulus. Mildly thickened leaflets . Doppler: There was mild regurgitation.  Peak gradient (D): 6 mm Hg.  ------------------------------------------------------------------- Left atrium: The atrium was moderately dilated.  ------------------------------------------------------------------- Right ventricle: The cavity size was normal. Wall thickness was normal.  Systolic function was normal.  ------------------------------------------------------------------- Pulmonic valve:  Structurally normal valve.  Cusp separation was normal. Doppler: Transvalvular velocity was within the normal range. There was mild regurgitation.  ------------------------------------------------------------------- Tricuspid valve:  Structurally normal valve.  Leaflet separation was normal. Doppler: Transvalvular velocity was within the normal range. There was trivial regurgitation.  ------------------------------------------------------------------- Right atrium: The atrium was normal in size.  ------------------------------------------------------------------- Pericardium: There was no pericardial effusion.  ------------------------------------------------------------------- Systemic veins: Inferior vena cava: The vessel was normal in size. The respirophasic diameter changes were in the normal range (= 50%), consistent with normal central venous pressure.  ASSESSMENT AND PLAN: 1.   Aortic valve disease status post TAVR: Patient with NYHA class I symptoms. He is physically active and doing quite well with no cardiopulmonary limitation. His most recent echocardiogram is reviewed as outlined above. While he did not have aortic regurgitation identified on echo, he does have a murmur typical of AI. He will have a follow-up echo done in April for his 1 year study. He continues on low-dose aspirin and warfarin in the setting of paroxysmal atrial fibrillation.  2. CAD, native vessel, with old MI: no symptoms of angina. Medical therapy reviewed and will be continued.  3. Paroxysmal atrial fibrillation: patient on long-term oral anticoagulation with warfarin and maintaining sinus rhythm. Recent TSH and LFTs are reviewed in the setting of long-term amiodarone.   4. Ischemic cardiomyopathy , chronic systolic heart failure NYHA 1: Stable on losartan. History of sinus  bradycardia precludes use of a beta blocker.   5. Essential hypertension: blood pressure mildly elevated today. Multiple office visits reviewed and he normally runs in good range. No medication changes made.   6. Hyperlipidemia: Patient remains on atorvastatin 80 mg.  Current medicines are reviewed with the patient today.  The patient does not have concerns regarding medicines.  Labs/ tests ordered today include:   Orders Placed This Encounter  Procedures  . EKG 12-Lead  . Echocardiogram    Disposition:   FU 6 months after echocardiogram completed.  Deatra James, MD  11/02/2015 12:17 PM    [facility] [address], [place], [place]  [address] Phone: [phone]; Fax: [phone]

## 2015-11-02 NOTE — Patient Instructions (Signed)
Medication Instructions:  Your physician recommends that you continue on your current medications as directed. Please refer to the Current Medication list given to you today.  Labwork: No new orders.   Testing/Procedures: Your physician has requested that you have an echocardiogram in April 2017. Echocardiography is a painless test that uses sound waves to create images of your heart. It provides your doctor with information about the size and shape of your heart and how well your heart's chambers and valves are working. This procedure takes approximately one hour. There are no restrictions for this procedure.  Follow-Up: Your physician wants you to follow-up in: April 2017 with [name].  You will receive a reminder letter in the mail two months in advance. If you don't receive a letter, please call our office to schedule the follow-up appointment.   Any Other Special Instructions Will Be Listed Below (If Applicable).     If you need a refill on your cardiac medications before your next appointment, please call your pharmacy.

## 2015-11-09 DIAGNOSIS — H01001 Unspecified blepharitis right upper eyelid: Secondary | ICD-10-CM | POA: Diagnosis not present

## 2015-11-09 DIAGNOSIS — H04123 Dry eye syndrome of bilateral lacrimal glands: Secondary | ICD-10-CM | POA: Diagnosis not present

## 2015-11-09 DIAGNOSIS — H25811 Combined forms of age-related cataract, right eye: Secondary | ICD-10-CM | POA: Diagnosis not present

## 2015-11-09 DIAGNOSIS — H01004 Unspecified blepharitis left upper eyelid: Secondary | ICD-10-CM | POA: Diagnosis not present

## 2015-11-10 ENCOUNTER — Encounter: Payer: Self-pay | Admitting: Internal Medicine

## 2015-11-11 ENCOUNTER — Other Ambulatory Visit: Payer: Self-pay | Admitting: Family Medicine

## 2015-11-25 ENCOUNTER — Telehealth: Payer: Self-pay | Admitting: Cardiovascular Disease

## 2015-11-25 NOTE — Telephone Encounter (Signed)
New Message  Pt wants to know if he can take Mirlalax    He has been constipated for 2 weeks or more  He has a artificial valve and it states that its not safe to take after having one

## 2015-11-25 NOTE — Telephone Encounter (Signed)
I spoke with the pt's wife and made her aware that the pt can take Mirilax.

## 2015-12-08 ENCOUNTER — Ambulatory Visit (INDEPENDENT_AMBULATORY_CARE_PROVIDER_SITE_OTHER): Payer: Medicare Other | Admitting: General Practice

## 2015-12-08 DIAGNOSIS — I482 Chronic atrial fibrillation, unspecified: Secondary | ICD-10-CM

## 2015-12-08 DIAGNOSIS — Z5181 Encounter for therapeutic drug level monitoring: Secondary | ICD-10-CM | POA: Diagnosis not present

## 2015-12-08 DIAGNOSIS — Z952 Presence of prosthetic heart valve: Secondary | ICD-10-CM

## 2015-12-08 LAB — POCT INR: INR: 2.3

## 2015-12-08 NOTE — Progress Notes (Signed)
Pre visit review using our clinic review tool, if applicable. No additional management support is needed unless otherwise documented below in the visit note. 

## 2015-12-08 NOTE — Progress Notes (Signed)
I have reviewed and agree with the plan. 

## 2015-12-23 ENCOUNTER — Encounter: Payer: Self-pay | Admitting: Family Medicine

## 2015-12-23 ENCOUNTER — Ambulatory Visit (INDEPENDENT_AMBULATORY_CARE_PROVIDER_SITE_OTHER): Payer: Medicare Other | Admitting: Family Medicine

## 2015-12-23 VITALS — BP 128/68 | HR 63 | Temp 98.1°F | Ht 62.0 in | Wt 132.0 lb

## 2015-12-23 DIAGNOSIS — K5900 Constipation, unspecified: Secondary | ICD-10-CM | POA: Diagnosis not present

## 2015-12-23 NOTE — Progress Notes (Signed)
Subjective:    Patient ID: Jesus [name], male    DOB: Jan 01, 1941, 75 y.o.   MRN: [id number]  HPI Patient seen with complaint of "constipation". He actually has bowel movements most days but has small pellet-like stools sometimes very does not consume a lot of water. Her's is getting fairly good fiber intake and he is fairly active and on his feet a lot. Recent TSH normal. He has not had any recent change in medications. Does not take any anti-cholinergic medications. Denies any abdominal pain or bloating. Colonoscopy 2011 unremarkable  Past Medical History  Diagnosis Date  . Esophageal reflux   . Atrial fibrillation (HCC)     holding sinus rhythm on Amiodarone  . Aortic stenosis     mild with a mean aortic valve gradient of 12 mmHg  . CAD (coronary artery disease)     a. S/P Ant MI 1980;  b. 1997 S/P CABG x 8 (LIMA to diag-LAD, SVG to OM1-OM2-OM3, SVG to [facility] - Dr [name]);  c. 01/2015 Cath: 3VD, 8/8 patent grafts.  [name] Unspecified essential hypertension   . History of transesophageal echocardiography (TEE) for monitoring   . Cardiomyopathy   . Other and unspecified hyperlipidemia   . Skin lesions, generalized     facial which may represent actinic keratoses and possible photosensitivity from Amiodarone  . GERD (gastroesophageal reflux disease)   . History of colonoscopy   . Acute myocardial infarction of inferior wall ([place]) 1980  . Dysrhythmia   . Headache   . S/P TAVR (transcatheter aortic valve replacement)     a. 03/2015 26 mm Edwards Sapien 3 transcatheter heart valve placed via open left transfemoral approach.   Past Surgical History  Procedure Laterality Date  . Tonsillectomy    . Coronary artery bypass graft  02/09/1996    LIMA to diag-LAD, SVG to OM1-OM2-OM3, [facility]G to [facility]  . Cholecystectomy    . Cardioversion  11/18/2006    Dr. [name]  . Cataract extraction w/ intraocular lens implant  April '13  (Dr. [name])    left eye only  . Left and right heart  catheterization with coronary/graft angiogram N/A 01/26/2015    Procedure: LEFT AND RIGHT HEART CATHETERIZATION WITH [name];  Surgeon: [name], MD;  Location: [facility] CATH LAB;  Service: Cardiovascular;  Laterality: N/A;  . Cardiac catheterization    . Eye surgery    . Transcatheter aortic valve replacement, transfemoral N/A 03/10/2015    Procedure: TRANSCATHETER AORTIC VALVE REPLACEMENT, TRANSFEMORAL;  Surgeon: [name], MD;  Location: [place];  Service: Open Heart Surgery;  Laterality: N/A;  . Tee without cardioversion N/A 03/10/2015    Procedure: TRANSESOPHAGEAL ECHOCARDIOGRAM (TEE);  Surgeon: [name], MD;  Location: [place];  Service: Open Heart Surgery;  Laterality: N/A;    reports that he has never smoked. He has never used smokeless tobacco. He reports that he does not drink alcohol or use illicit drugs. family history includes Atrial fibrillation in his brother; Crohn's disease in his mother; Heart attack (age of onset: 28) in his father; Heart disease in his father and paternal uncle; Heart failure in his father; Prostate cancer in his paternal uncle; Stroke (age of onset: 51) in his mother. Allergies  Allergen Reactions  . Codeine Other (See Comments)    Pt does not remember  . Iodine Swelling      Review of Systems  Constitutional: Negative for appetite change and unexpected weight change.  Respiratory: Negative for shortness of breath.  Cardiovascular: Negative for chest pain.  Gastrointestinal: Positive for constipation. Negative for nausea, vomiting and abdominal pain.       Objective:   Physical Exam  Constitutional: He appears well-developed and well-nourished. No distress.  Neck: Neck supple.  Cardiovascular: Normal rate and regular rhythm.   Pulmonary/Chest: Effort normal and breath sounds normal. No respiratory distress. He has no wheezes. He has no rales.  Abdominal: Soft. Bowel sounds are normal. He exhibits no distension. There is no  tenderness. There is no rebound.          Assessment & Plan:  Constipation. He is going fairly frequent but small caliber stools. We recommended more water consumption. 25-30 g of fiber per day. Handout given. Continue to walk a lot. Stool softeners as needed. MiraLAX but no more than 4 days per week

## 2015-12-23 NOTE — Progress Notes (Signed)
Pre visit review using our clinic review tool, if applicable. No additional management support is needed unless otherwise documented below in the visit note. 

## 2015-12-23 NOTE — Patient Instructions (Signed)

## 2016-01-13 ENCOUNTER — Ambulatory Visit (INDEPENDENT_AMBULATORY_CARE_PROVIDER_SITE_OTHER): Payer: Medicare Other | Admitting: General Practice

## 2016-01-13 ENCOUNTER — Telehealth: Payer: Self-pay

## 2016-01-13 ENCOUNTER — Ambulatory Visit (INDEPENDENT_AMBULATORY_CARE_PROVIDER_SITE_OTHER): Payer: Medicare Other | Admitting: Gastroenterology

## 2016-01-13 ENCOUNTER — Encounter: Payer: Self-pay | Admitting: Gastroenterology

## 2016-01-13 VITALS — BP 142/64 | HR 51 | Ht 62.0 in | Wt 132.0 lb

## 2016-01-13 DIAGNOSIS — Z954 Presence of other heart-valve replacement: Secondary | ICD-10-CM | POA: Diagnosis not present

## 2016-01-13 DIAGNOSIS — K5909 Other constipation: Secondary | ICD-10-CM

## 2016-01-13 DIAGNOSIS — Z7901 Long term (current) use of anticoagulants: Secondary | ICD-10-CM

## 2016-01-13 DIAGNOSIS — Z5181 Encounter for therapeutic drug level monitoring: Secondary | ICD-10-CM

## 2016-01-13 DIAGNOSIS — Z1211 Encounter for screening for malignant neoplasm of colon: Secondary | ICD-10-CM | POA: Diagnosis not present

## 2016-01-13 DIAGNOSIS — I482 Chronic atrial fibrillation, unspecified: Secondary | ICD-10-CM

## 2016-01-13 DIAGNOSIS — Z952 Presence of prosthetic heart valve: Secondary | ICD-10-CM

## 2016-01-13 LAB — POCT INR: INR: 2

## 2016-01-13 MED ORDER — NA SULFATE-K SULFATE-MG SULF 17.5-3.13-1.6 GM/177ML PO SOLN: 1.0000 | Freq: Once | ORAL | Status: DC

## 2016-01-13 NOTE — Patient Instructions (Addendum)
You will be set up for a colonoscopy for polyp surveillance. We will communicate with your cardiologist, Dr. [name], about the safety of holding your blood thinner coumadin for 5 days prior to colonoscopy. Please start one dose of OTC miralax every day. Continue taking your benefiber as usual.

## 2016-01-13 NOTE — Telephone Encounter (Signed)
01/13/2016   RE: Jesus [name] DOB: [birth year] MRN: [id number]   Dear [name],    We have scheduled the above patient for an endoscopic procedure. Our records show that he is on anticoagulation therapy.   Please advise as to how long the patient may come off his therapy of Coumadin prior to the procedure..  Please fax back/ or route to Travonna Swindle at [phone].   Sincerely,    Christian Mate RN

## 2016-01-13 NOTE — Progress Notes (Signed)
Review of pertinent gastrointestinal problems: 1. Precancerous colon polyps. Colonoscopy Dr. Delfin Edis 2011 for "routine risk screening" documented one 8 mm rectal polyp that was removed and found to be adenoma without high-grade dysplasia on pathology. There was also left-sided diverticulosis. He was recommended to have repeat colonoscopy at five-year interval.    HPI: This is a  Very pleasant 75 year old man who is here with his wife today. We sent him a letter asking him to come to the office to see if colonoscopy for polyp surveillance is still a reasonable clinical question for him.   Can be constipated at times for the past year or so.  Has increased his water intake.  Takes benefiber daily.   This helps a bit but he is still somewhat bothered by having to push and strain. He never sees blood in his stool  Chief complaint is  Personal history of precancerous colon polyps   He is on Coumadin for aortic valve.   Past Medical History  Diagnosis Date  . Esophageal reflux   . Atrial fibrillation (HCC)     holding sinus rhythm on Amiodarone  . Aortic stenosis     mild with a mean aortic valve gradient of 12 mmHg  . CAD (coronary artery disease)     a. S/P Ant MI 1980;  b. 1997 S/P CABG x 8 (LIMA to diag-LAD, SVG to OM1-OM2-OM3, SVG to [facility] - Dr [name]);  c. 01/2015 Cath: 3VD, 8/8 patent grafts.  [name] Unspecified essential hypertension   . History of transesophageal echocardiography (TEE) for monitoring   . Cardiomyopathy   . Other and unspecified hyperlipidemia   . Skin lesions, generalized     facial which may represent actinic keratoses and possible photosensitivity from Amiodarone  . GERD (gastroesophageal reflux disease)   . History of colonoscopy   . Acute myocardial infarction of inferior wall ([place]) 1980  . Dysrhythmia   . Headache   . S/P TAVR (transcatheter aortic valve replacement)     a. 03/2015 26 mm Edwards Sapien 3 transcatheter heart valve placed via open left  transfemoral approach.    Past Surgical History  Procedure Laterality Date  . Tonsillectomy    . Coronary artery bypass graft  02/09/1996    LIMA to diag-LAD, SVG to OM1-OM2-OM3, SVG to [facility]  . Cholecystectomy    . Cardioversion  11/18/2006    Dr. [name]  . Cataract extraction w/ intraocular lens implant  April '13  (Dr. [name])    left eye only  . Left and right heart catheterization with coronary/graft angiogram N/A 01/26/2015    Procedure: LEFT AND RIGHT HEART CATHETERIZATION WITH [name];  Surgeon: [name], MD;  Location: [facility] CATH LAB;  Service: Cardiovascular;  Laterality: N/A;  . Cardiac catheterization    . Eye surgery    . Transcatheter aortic valve replacement, transfemoral N/A 03/10/2015    Procedure: TRANSCATHETER AORTIC VALVE REPLACEMENT, TRANSFEMORAL;  Surgeon: [name], MD;  Location: [place];  Service: Open Heart Surgery;  Laterality: N/A;  . Tee without cardioversion N/A 03/10/2015    Procedure: TRANSESOPHAGEAL ECHOCARDIOGRAM (TEE);  Surgeon: [name], MD;  Location: [place];  Service: Open Heart Surgery;  Laterality: N/A;    Current Outpatient Prescriptions  Medication Sig Dispense Refill  . acetaminophen (TYLENOL) 500 MG tablet Take 500 mg by mouth every 6 (six) hours as needed for moderate pain.    [name] amiodarone (PACERONE) 200 MG tablet Take 0.5 tablets (100 mg total) by mouth daily. [place]  tablet 3  . amLODipine (NORVASC) 5 MG tablet Take 1 tablet (5 mg total) by mouth daily. 30 tablet 11  . Ascorbic Acid (VITAMIN C) 1000 MG tablet Take 1,000 mg by mouth daily.      [name] aspirin 81 MG tablet Take 81 mg by mouth daily.      [name] atorvastatin (LIPITOR) 80 MG tablet TAKE 1/2 TABLET BY MOUTH DAILY 45 tablet 3  . loratadine (CLARITIN) 10 MG tablet Take 10 mg by mouth daily as needed for allergies.     [name] losartan (COZAAR) 100 MG tablet Take 1 tablet (100 mg total) by mouth daily. 30 tablet 5  . Multiple Vitamins-Minerals  (MULTIVITAMIN,TX-MINERALS) tablet Take 1 tablet by mouth daily.      . pantoprazole (PROTONIX) 40 MG tablet TAKE 1 TABLET BY MOUTH ONCE DAILY. 90 tablet 2  . traMADol (ULTRAM) 50 MG tablet Take 1-2 tablets (50-100 mg total) by mouth every 4 (four) hours as needed for moderate pain. 30 tablet 0  . warfarin (COUMADIN) 5 MG tablet Take as directed by coumadin clinic. 30 tablet 3   No current facility-administered medications for this visit.    Allergies as of 01/13/2016 - Review Complete 01/13/2016  Allergen Reaction Noted  . Codeine Other (See Comments) 09/10/2007  . Iodine Swelling     Family History  Problem Relation Age of Onset  . Stroke Mother 25    Deceased  . Crohn's disease Mother     Deceased  . Heart attack Father 55    Deceased  . Heart disease Father   . Heart failure Father     Deceased  . Atrial fibrillation Brother   . Heart disease Paternal Uncle   . Prostate cancer Paternal Uncle     Social History   Social History  . Marital Status: Married    Spouse Name: N/A  . Number of Children: N/A  . Years of Education: N/A   Occupational History  . Not on file.   Social History Main Topics  . Smoking status: Never Smoker   . Smokeless tobacco: Never Used     Comment: Does not smoke.  [name] Alcohol Use: No  . Drug Use: No  . Sexual Activity: Not Currently   Other Topics Concern  . Not on file   Social History Narrative   [employer]. [place] 6 years. Married - '69 - 1 year/divorced. '76 - . No children. Work - mfg/textiles - Designer, multimedia; currently works doing maintenance. [employer] - they have discussed this. Provided packet august '13.                  Physical Exam: BP 142/64 mmHg  Pulse 51  Ht 5\' 2"  (1.575 m)  Wt 132 lb (59.875 kg)  BMI 24.14 kg/m2 Constitutional: generally well-appearing Psychiatric: alert and oriented x3 Abdomen: soft, nontender, nondistended, no obvious ascites, no peritoneal signs, normal bowel sounds   Assessment  and plan: 75 y.o. male with  Personal history of precancerous colon polyps   he is on Coumadin and he understands that being on this blood thinner puts him at increased risk for procedural complications during a colonoscopy. We will communicate with his cardiologist about the safety of him holding his Coumadin for 5 days prior to colonoscopy. After meeting him , it is pretty clear that he is of generally good health since his aortic valve repair. He works quite physically in his yard several hours a day and I think colon cancer ,  colon polyp surveillance is still a reasonable clinical question for him.   Owens Loffler, MD [facility] 01/13/2016, 10:55 AM

## 2016-01-13 NOTE — Progress Notes (Signed)
I have reviewed and agree with the plan. 

## 2016-01-13 NOTE — Progress Notes (Signed)
Pre visit review using our clinic review tool, if applicable. No additional management support is needed unless otherwise documented below in the visit note. 

## 2016-01-14 ENCOUNTER — Other Ambulatory Visit: Payer: Self-pay | Admitting: Family Medicine

## 2016-01-15 NOTE — Telephone Encounter (Signed)
The patient has been notified of this information and all questions answered.

## 2016-01-15 NOTE — Telephone Encounter (Signed)
Left message on machine to call back  

## 2016-01-15 NOTE — Telephone Encounter (Signed)
Letter resent to [name]

## 2016-01-15 NOTE — Telephone Encounter (Signed)
Ok to hold warfarin x 5 days per protocol - no bridging indicated. thx

## 2016-01-18 DIAGNOSIS — J0100 Acute maxillary sinusitis, unspecified: Secondary | ICD-10-CM | POA: Diagnosis not present

## 2016-01-22 ENCOUNTER — Ambulatory Visit: Payer: Medicare Other

## 2016-01-28 ENCOUNTER — Telehealth: Payer: Self-pay | Admitting: Cardiovascular Disease

## 2016-01-28 ENCOUNTER — Telehealth: Payer: Self-pay | Admitting: General Practice

## 2016-01-28 ENCOUNTER — Telehealth: Payer: Self-pay | Admitting: Family Medicine

## 2016-01-28 NOTE — Telephone Encounter (Signed)
Please advise 

## 2016-01-28 NOTE — Telephone Encounter (Signed)
Informed patient's DPR that both doxycycline and bowel prep are fine, but he will need to be closely monitored by Meriam Sprague. Today is the last day patient is taking doxycycline. Colonoscopy is scheduled for March 3.  To Meriam Sprague to call patient to arrange follow-up.

## 2016-01-28 NOTE — Telephone Encounter (Signed)
Spoke with patient's wife about dosing instructions and about re-checking INR after procedure.

## 2016-01-28 NOTE — Telephone Encounter (Signed)
New message      Pt has a heart valve.  He took doxycycline for a sinus infection.  Also, can he take the prep for a colonscopy?

## 2016-01-28 NOTE — Telephone Encounter (Signed)
I don't know of any problems with that. Other suggestion would be to take something like applesauce or yogurt

## 2016-01-28 NOTE — Telephone Encounter (Signed)
Both are ok - would have him follow up closely with Meriam Sprague who monitors his Coumadin since both doxycycline and diarrhea from bowel prep can increase his INR.

## 2016-01-28 NOTE — Telephone Encounter (Signed)
Pt is aware.  

## 2016-01-28 NOTE — Telephone Encounter (Signed)
Patient stated he went to the urgent care last week, they gave him doxycyline 100 mg he can't take capsules so he opened it  and pored it in his tongue, is it ok to take that way, please advise.

## 2016-01-28 NOTE — Telephone Encounter (Signed)
Spoke with patient's wife.  Instructed patient to take 7.5 mg of coumadin on 3/4 and 3/5, 5 mg on 3/6 and 3/7 and to resume current dosage on 3/8.  Patient will come into clinic on 3/10 to have INR checked.  Patient's wife verbalized understanding.

## 2016-02-01 ENCOUNTER — Other Ambulatory Visit: Payer: Self-pay | Admitting: *Deleted

## 2016-02-01 MED ORDER — AMIODARONE HCL 200 MG PO TABS: 100.0000 mg | ORAL_TABLET | Freq: Every day | ORAL | Status: DC

## 2016-02-02 ENCOUNTER — Telehealth: Payer: Self-pay | Admitting: Cardiovascular Disease

## 2016-02-02 DIAGNOSIS — E785 Hyperlipidemia, unspecified: Secondary | ICD-10-CM

## 2016-02-02 DIAGNOSIS — I350 Nonrheumatic aortic (valve) stenosis: Secondary | ICD-10-CM

## 2016-02-02 NOTE — Telephone Encounter (Signed)
New message     Pt has an appt scheduled on 03-12-15 with [name].  Will he need to have labs drawn prior to ov?

## 2016-02-03 NOTE — Telephone Encounter (Signed)
Pt currently scheduled for echocardiogram and OV on 03/11/16.  The pt would like to know if he needs lab work prior to appt.  Last labs were checked in November (amiodarone labs).  I will forward this message to [name] to review.

## 2016-02-03 NOTE — Telephone Encounter (Signed)
Would check CMET, Lipids, TSH - thx

## 2016-02-03 NOTE — Telephone Encounter (Signed)
I spoke with the pt's wife and the pt will have fasting lab work on 03/11/16.

## 2016-02-05 ENCOUNTER — Ambulatory Visit (AMBULATORY_SURGERY_CENTER): Payer: Medicare Other | Admitting: Gastroenterology

## 2016-02-05 ENCOUNTER — Encounter: Payer: Self-pay | Admitting: Gastroenterology

## 2016-02-05 VITALS — BP 123/50 | HR 50 | Temp 97.8°F | Resp 18 | Ht 62.0 in | Wt 132.0 lb

## 2016-02-05 DIAGNOSIS — D128 Benign neoplasm of rectum: Secondary | ICD-10-CM | POA: Diagnosis not present

## 2016-02-05 DIAGNOSIS — K621 Rectal polyp: Secondary | ICD-10-CM | POA: Diagnosis not present

## 2016-02-05 DIAGNOSIS — D124 Benign neoplasm of descending colon: Secondary | ICD-10-CM | POA: Diagnosis not present

## 2016-02-05 DIAGNOSIS — Z86010 Personal history of colonic polyps: Secondary | ICD-10-CM

## 2016-02-05 DIAGNOSIS — Z1211 Encounter for screening for malignant neoplasm of colon: Secondary | ICD-10-CM | POA: Diagnosis not present

## 2016-02-05 MED ORDER — SODIUM CHLORIDE 0.9 % IV SOLN: 500.0000 mL | INTRAVENOUS | Status: DC

## 2016-02-05 NOTE — Patient Instructions (Signed)
Discharge instructions given. Handouts on polyps and diverticulosis. Resume previous medications. YOU HAD AN ENDOSCOPIC PROCEDURE TODAY AT THE [facility]:   Refer to the procedure report that was given to you for any specific questions about what was found during the examination.  If the procedure report does not answer your questions, please call your gastroenterologist to clarify.  If you requested that your care partner not be given the details of your procedure findings, then the procedure report has been included in a sealed envelope for you to review at your convenience later.  YOU SHOULD EXPECT: Some feelings of bloating in the abdomen. Passage of more gas than usual.  Walking can help get rid of the air that was put into your GI tract during the procedure and reduce the bloating. If you had a lower endoscopy (such as a colonoscopy or flexible sigmoidoscopy) you may notice spotting of blood in your stool or on the toilet paper. If you underwent a bowel prep for your procedure, you may not have a normal bowel movement for a few days.  Please Note:  You might notice some irritation and congestion in your nose or some drainage.  This is from the oxygen used during your procedure.  There is no need for concern and it should clear up in a day or so.  SYMPTOMS TO REPORT IMMEDIATELY:   Following lower endoscopy (colonoscopy or flexible sigmoidoscopy):  Excessive amounts of blood in the stool  Significant tenderness or worsening of abdominal pains  Swelling of the abdomen that is new, acute  Fever of 100F or higher   For urgent or emergent issues, a gastroenterologist can be reached at any hour by calling [phone].   DIET: Your first meal following the procedure should be a small meal and then it is ok to progress to your normal diet. Heavy or fried foods are harder to digest and may make you feel nauseous or bloated.  Likewise, meals heavy in dairy and vegetables can  increase bloating.  Drink plenty of fluids but you should avoid alcoholic beverages for 24 hours.  ACTIVITY:  You should plan to take it easy for the rest of today and you should NOT DRIVE or use heavy machinery until tomorrow (because of the sedation medicines used during the test).    FOLLOW UP: Our staff will call the number listed on your records the next business day following your procedure to check on you and address any questions or concerns that you may have regarding the information given to you following your procedure. If we do not reach you, we will leave a message.  However, if you are feeling well and you are not experiencing any problems, there is no need to return our call.  We will assume that you have returned to your regular daily activities without incident.  If any biopsies were taken you will be contacted by phone or by letter within the next 1-3 weeks.  Please call us at [phone] if you have not heard about the biopsies in 3 weeks.    SIGNATURES/CONFIDENTIALITY: You and/or your care partner have signed paperwork which will be entered into your electronic medical record.  These signatures attest to the fact that that the information above on your After Visit Summary has been reviewed and is understood.  Full responsibility of the confidentiality of this discharge information lies with you and/or your care-partner. 

## 2016-02-05 NOTE — Progress Notes (Signed)
Report to PACU, RN, vss, BBS= Clear.  

## 2016-02-05 NOTE — Op Note (Signed)
[place]  [address] [place], [id number]   COLONOSCOPY PROCEDURE REPORT  PATIENT: Jesus [name], Jesus [name]  MR#: [id number] BIRTHDATE: Feb 27, 1941 , 49  yrs. old GENDER: male ENDOSCOPIST: [name] Banister, MD PROCEDURE DATE:  02/05/2016 PROCEDURE:   Colonoscopy, surveillance and Colonoscopy with snare polypectomy First Screening Colonoscopy - Avg.  risk and is 50 yrs.  old or older - No.  Prior Negative Screening - Now for repeat screening. N/A  History of Adenoma - Now for follow-up colonoscopy & has been > or = to 3 yrs.  Yes hx of adenoma.  Has been 3 or more years since last colonoscopy.  Polyps removed today? Yes ASA CLASS:   Class III INDICATIONS:Surveillance due to prior colonic neoplasia and single subCM adenoma removed by colonoscopy 2011 (Dr.  Ardis Hughs). MEDICATIONS: Monitored anesthesia care and Propofol 100 mg IV  DESCRIPTION OF PROCEDURE:   After the risks benefits and alternatives of the procedure were thoroughly explained, informed consent was obtained.  The digital rectal exam revealed no abnormalities of the rectum.   The [id number]  endoscope was introduced through the anus and advanced to the cecum, which was identified by both the appendix and ileocecal valve. No adverse events experienced.   The quality of the prep was excellent.  The instrument was then slowly withdrawn as the colon was fully examined. Estimated blood loss is zero unless otherwise noted in this procedure report.   COLON FINDINGS: Two polyps were found, removed and sent to pathology.  These were both sessile, located in descending and rectum, 4-46mm across, removed with cold snare.  The rectal polyp was slightly heaped up, soft and there was more than usual post polypectomy oozinf of blood at the site.  This was controlled with submucosal injection of 1cc of dilute epinephrine.  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  The examination was otherwise  normal.  Retroflexed views revealed no abnormalities. The time to cecum = 3.4 Withdrawal time = 12.1   The scope was withdrawn and the procedure completed. COMPLICATIONS: There were no immediate complications.  ENDOSCOPIC IMPRESSION: Two polyps were found, removed and sent to pathology.  These were both sessile, located in descending and rectum, 4-25mm across, removed with cold snare.  The rectal polyp was slightly heaped up, soft and there was more than usual post polypectomy oozinf of blood at the site.  This was controlled with submucosal injection of 1cc of dilute epinephrine.  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  The examination was otherwise normal  RECOMMENDATIONS: Await final pathology results. You will receive a letter within 1-2 weeks with the results of your biopsy as well as final recommendations.  Please call my office if you have not received a letter after 3 weeks.  eSigned:  [name] Banister, MD 02/05/2016 1:49 PM   cc: [name], MD

## 2016-02-05 NOTE — Progress Notes (Signed)
Called to room to assist during endoscopic procedure.  Patient ID and intended procedure confirmed with present staff. Received instructions for my participation in the procedure from the performing physician.  

## 2016-02-08 ENCOUNTER — Telehealth: Payer: Self-pay | Admitting: *Deleted

## 2016-02-08 NOTE — Telephone Encounter (Signed)
  Follow up Call-  Call back number 02/05/2016  Post procedure Call Back phone  # [phone]  Permission to leave phone message No    Spoke with wife, pt at work Patient questions:  Do you have a fever, pain , or abdominal swelling? No. Pain Score  0 *  Have you tolerated food without any problems? Yes.    Have you been able to return to your normal activities? Yes.    Do you have any questions about your discharge instructions: Diet   No. Medications  No. Follow up visit  No.  Do you have questions or concerns about your Care? No.  Actions: * If pain score is 4 or above: No action needed, pain <4.

## 2016-02-10 ENCOUNTER — Encounter: Payer: Medicare Other | Admitting: Family Medicine

## 2016-02-12 ENCOUNTER — Ambulatory Visit (INDEPENDENT_AMBULATORY_CARE_PROVIDER_SITE_OTHER): Payer: Medicare Other | Admitting: General Practice

## 2016-02-12 DIAGNOSIS — Z954 Presence of other heart-valve replacement: Secondary | ICD-10-CM

## 2016-02-12 DIAGNOSIS — Z5181 Encounter for therapeutic drug level monitoring: Secondary | ICD-10-CM

## 2016-02-12 DIAGNOSIS — I482 Chronic atrial fibrillation, unspecified: Secondary | ICD-10-CM

## 2016-02-12 DIAGNOSIS — Z952 Presence of prosthetic heart valve: Secondary | ICD-10-CM

## 2016-02-12 LAB — POCT INR: INR: 2.7

## 2016-02-12 NOTE — Progress Notes (Signed)
Pre visit review using our clinic review tool, if applicable. No additional management support is needed unless otherwise documented below in the visit note. 

## 2016-02-12 NOTE — Progress Notes (Signed)
I have reviewed and agree with the plan. 

## 2016-02-16 ENCOUNTER — Encounter: Payer: Self-pay | Admitting: Gastroenterology

## 2016-02-26 ENCOUNTER — Ambulatory Visit: Payer: Medicare Other

## 2016-03-11 ENCOUNTER — Ambulatory Visit (INDEPENDENT_AMBULATORY_CARE_PROVIDER_SITE_OTHER): Payer: Medicare Other | Admitting: *Deleted

## 2016-03-11 ENCOUNTER — Other Ambulatory Visit (INDEPENDENT_AMBULATORY_CARE_PROVIDER_SITE_OTHER): Payer: Medicare Other | Admitting: *Deleted

## 2016-03-11 ENCOUNTER — Other Ambulatory Visit: Payer: Self-pay

## 2016-03-11 ENCOUNTER — Other Ambulatory Visit (HOSPITAL_COMMUNITY): Payer: Medicare Other

## 2016-03-11 ENCOUNTER — Ambulatory Visit (HOSPITAL_COMMUNITY): Payer: Medicare Other | Attending: Internal Medicine

## 2016-03-11 ENCOUNTER — Ambulatory Visit (INDEPENDENT_AMBULATORY_CARE_PROVIDER_SITE_OTHER): Payer: Medicare Other | Admitting: Cardiovascular Disease

## 2016-03-11 ENCOUNTER — Encounter: Payer: Self-pay | Admitting: Cardiovascular Disease

## 2016-03-11 VITALS — BP 152/64 | HR 52 | Ht 62.0 in | Wt 130.2 lb

## 2016-03-11 DIAGNOSIS — Z952 Presence of prosthetic heart valve: Secondary | ICD-10-CM | POA: Diagnosis not present

## 2016-03-11 DIAGNOSIS — I482 Chronic atrial fibrillation, unspecified: Secondary | ICD-10-CM

## 2016-03-11 DIAGNOSIS — E785 Hyperlipidemia, unspecified: Secondary | ICD-10-CM

## 2016-03-11 DIAGNOSIS — Z5181 Encounter for therapeutic drug level monitoring: Secondary | ICD-10-CM | POA: Diagnosis not present

## 2016-03-11 DIAGNOSIS — I252 Old myocardial infarction: Secondary | ICD-10-CM | POA: Diagnosis not present

## 2016-03-11 DIAGNOSIS — I350 Nonrheumatic aortic (valve) stenosis: Secondary | ICD-10-CM

## 2016-03-11 DIAGNOSIS — Z954 Presence of other heart-valve replacement: Secondary | ICD-10-CM

## 2016-03-11 DIAGNOSIS — I2510 Atherosclerotic heart disease of native coronary artery without angina pectoris: Secondary | ICD-10-CM | POA: Insufficient documentation

## 2016-03-11 DIAGNOSIS — I10 Essential (primary) hypertension: Secondary | ICD-10-CM | POA: Diagnosis not present

## 2016-03-11 DIAGNOSIS — I119 Hypertensive heart disease without heart failure: Secondary | ICD-10-CM | POA: Diagnosis not present

## 2016-03-11 DIAGNOSIS — I359 Nonrheumatic aortic valve disorder, unspecified: Secondary | ICD-10-CM | POA: Diagnosis not present

## 2016-03-11 DIAGNOSIS — I351 Nonrheumatic aortic (valve) insufficiency: Secondary | ICD-10-CM | POA: Insufficient documentation

## 2016-03-11 DIAGNOSIS — I255 Ischemic cardiomyopathy: Secondary | ICD-10-CM | POA: Insufficient documentation

## 2016-03-11 DIAGNOSIS — R29898 Other symptoms and signs involving the musculoskeletal system: Secondary | ICD-10-CM | POA: Insufficient documentation

## 2016-03-11 DIAGNOSIS — Z951 Presence of aortocoronary bypass graft: Secondary | ICD-10-CM | POA: Insufficient documentation

## 2016-03-11 LAB — COMPREHENSIVE METABOLIC PANEL
ALBUMIN: 4.1 g/dL (ref 3.6–5.1)
ALT: 18 U/L (ref 9–46)
AST: 32 U/L (ref 10–35)
Alkaline Phosphatase: 97 U/L (ref 40–115)
BILIRUBIN TOTAL: 0.9 mg/dL (ref 0.2–1.2)
BUN: 15 mg/dL (ref 7–25)
CHLORIDE: 107 mmol/L (ref 98–110)
CO2: 26 mmol/L (ref 20–31)
CREATININE: 0.88 mg/dL (ref 0.70–1.18)
Calcium: 9 mg/dL (ref 8.6–10.3)
GLUCOSE: 91 mg/dL (ref 65–99)
Potassium: 4.2 mmol/L (ref 3.5–5.3)
SODIUM: 141 mmol/L (ref 135–146)
Total Protein: 6.5 g/dL (ref 6.1–8.1)

## 2016-03-11 LAB — LIPID PANEL
CHOL/HDL RATIO: 2.8 ratio (ref <=5.0)
CHOLESTEROL: 154 mg/dL (ref 125–200)
HDL: 55 mg/dL (ref >=40)
LDL Cholesterol: 82 mg/dL (ref <130)
TRIGLYCERIDES: 84 mg/dL (ref <150)
VLDL: 17 mg/dL (ref <30)

## 2016-03-11 LAB — POCT INR: INR: 2.4

## 2016-03-11 LAB — TSH: TSH: 3.41 mIU/L (ref 0.40–4.50)

## 2016-03-11 NOTE — Patient Instructions (Signed)

## 2016-03-11 NOTE — Progress Notes (Signed)
Cardiology Office Note Date:  03/11/2016   ID:  FED VILLAFUERTE, DOB 18-Jun-1941, MRN [id number]  PCP:  [name], MD  Cardiologist:  [name], MD    Chief Complaint  Patient presents with  . Aortic Stenosis  . Coronary Artery Disease     History of Present Illness: Jesus [name] is a 75 y.o. male who presents for  Follow-up of aortic valve disease, coronary disease, paroxysmal atrial fibrillation, and hypertension.   the patient had remote inferior MI in 1980. He underwent CABG in 1997. He developed progressive symptoms of severe aortic stenosis and underwent TAVR one year ago via a transfemoral approach. He was treated with a 26 mm Sapien 3 transcatheter heart valve.  He did remarkably well and had significant improvement in his functional capacity. He's here with his wife this morning. He continues to tolerate Coumadin without bleeding problems. He had lab work and an echocardiogram performed today, results of both studies are currently pending. He has been physically active with no exertional symptoms. He specifically denies chest pain, shortness of breath , leg swelling, heart palpitations, orthopnea, or PND.   Past Medical History  Diagnosis Date  . Esophageal reflux   . Atrial fibrillation (HCC)     holding sinus rhythm on Amiodarone  . Aortic stenosis     mild with a mean aortic valve gradient of 12 mmHg  . CAD (coronary artery disease)     a. S/P Ant MI 1980;  b. 1997 S/P CABG x 8 (LIMA to diag-LAD, SVG to OM1-OM2-OM3, SVG to [facility] - Dr [name]);  c. 01/2015 Cath: 3VD, 8/8 patent grafts.  [name] Unspecified essential hypertension   . History of transesophageal echocardiography (TEE) for monitoring   . Cardiomyopathy   . Other and unspecified hyperlipidemia   . Skin lesions, generalized     facial which may represent actinic keratoses and possible photosensitivity from Amiodarone  . GERD (gastroesophageal reflux disease)   . History of colonoscopy   .  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 Dysrhythmia   . Headache   . S/P TAVR (transcatheter aortic valve replacement)     a. 03/2015 26 mm Edwards Sapien 3 transcatheter heart valve placed via open left transfemoral approach.    Past Surgical History  Procedure Laterality Date  . Tonsillectomy    . Coronary artery bypass graft  02/09/1996    LIMA to diag-LAD, SVG to OM1-OM2-OM3, SVG to [facility]  . Cholecystectomy    . Cardioversion  11/18/2006    Dr. [name]  . Cataract extraction w/ intraocular lens implant  April '13  (Dr. [name])    left eye only  . Left and right heart catheterization with coronary/graft angiogram N/A 01/26/2015    Procedure: LEFT AND RIGHT HEART CATHETERIZATION WITH [name];  Surgeon: [name], MD;  Location: [facility] CATH LAB;  Service: Cardiovascular;  Laterality: N/A;  . Cardiac catheterization    . Eye surgery    . Transcatheter aortic valve replacement, transfemoral N/A 03/10/2015    Procedure: TRANSCATHETER AORTIC VALVE REPLACEMENT, TRANSFEMORAL;  Surgeon: [name], MD;  Location: [facility];  Service: Open Heart Surgery;  Laterality: N/A;  . Tee without cardioversion N/A 03/10/2015    Procedure: TRANSESOPHAGEAL ECHOCARDIOGRAM (TEE);  Surgeon: [name], MD;  Location: [place];  Service: Open Heart Surgery;  Laterality: N/A;    Current Outpatient Prescriptions  Medication Sig Dispense Refill  . acetaminophen (TYLENOL) 500 MG tablet Take 500 mg by mouth  every 6 (six) hours as needed for moderate pain.    [name] amiodarone (PACERONE) 200 MG tablet Take 0.5 tablets (100 mg total) by mouth daily. 30 tablet 3  . amLODipine (NORVASC) 5 MG tablet Take 1 tablet (5 mg total) by mouth daily. 30 tablet 11  . Ascorbic Acid (VITAMIN C) 1000 MG tablet Take 1,000 mg by mouth daily.      [name] aspirin 81 MG tablet Take 81 mg by mouth daily.      [name] atorvastatin (LIPITOR) 80 MG tablet TAKE 1/2 TABLET BY MOUTH DAILY 45 tablet 3  .  loratadine (CLARITIN) 10 MG tablet Take 10 mg by mouth daily as needed for allergies.     [name] losartan (COZAAR) 100 MG tablet TAKE 1 TABLET BY MOUTH DAILY 30 tablet 5  . Multiple Vitamins-Minerals (MULTIVITAMIN,TX-MINERALS) tablet Take 1 tablet by mouth daily.      . pantoprazole (PROTONIX) 40 MG tablet TAKE 1 TABLET BY MOUTH ONCE DAILY. 90 tablet 2  . traMADol (ULTRAM) 50 MG tablet Take 1-2 tablets (50-100 mg total) by mouth every 4 (four) hours as needed for moderate pain. 30 tablet 0  . warfarin (COUMADIN) 5 MG tablet Take as directed by coumadin clinic. 30 tablet 3   No current facility-administered medications for this visit.    Allergies:   Codeine and Iodine   Social History:  The patient  reports that he has never smoked. He has never used smokeless tobacco. He reports that he does not drink alcohol or use illicit drugs.   Family History:  The patient's family history includes Atrial fibrillation in his brother; Crohn's disease in his mother; Heart attack (age of onset: 63) in his father; Heart disease in his father and paternal uncle; Heart failure in his father; Prostate cancer in his paternal uncle; Stroke (age of onset: 36) in his mother. There is no history of Colon cancer.    ROS:  Please see the history of present illness.  All other systems are reviewed and negative.   PHYSICAL EXAM: VS:  BP 152/64 mmHg  Pulse 52  Ht 5\' 2"  (1.575 m)  Wt 130 lb 3.2 oz (59.058 kg)  BMI 23.81 kg/m2 , BMI Body mass index is 23.81 kg/(m^2). GEN: Well nourished, well developed, in no acute distress HEENT: normal Neck: no JVD, no masses.  Bilateral carotid bruits Cardiac:  Bradycardic and regular with a grade and 2/6 diastolic decrescendo murmur  Heard best at the left upper sternal border       Respiratory:  clear to auscultation bilaterally, normal work of breathing GI: soft, nontender, nondistended, + BS MS: no deformity or atrophy Ext: no pretibial edema, pedal pulses 2+= bilaterally Skin:  warm and dry, no rash Neuro:  Strength and sensation are intact Psych: euthymic mood, full affect  EKG:  EKG is ordered today. The ekg ordered today shows  Marked sinus bradycardia 49 bpm, inferior infarct age undetermined , nonspecific IVCD.  Recent Labs: 10/26/2015: ALT 20; BUN 14; Creat 0.77; Potassium 4.0; Sodium 139; TSH 3.228   Lipid Panel     Component Value Date/Time   CHOL 149 01/21/2015 0827   TRIG 99.0 01/21/2015 0827   TRIG 67 11/20/2006 0748   HDL 49.40 01/21/2015 0827   CHOLHDL 3 01/21/2015 0827   CHOLHDL 3.2 CALC 11/20/2006 0748   VLDL 19.8 01/21/2015 0827   LDLCALC 80 01/21/2015 0827      Wt Readings from Last 3 Encounters:  03/11/16 130 lb 3.2 oz (59.058 kg)  02/05/16 132 lb (59.875 kg)  01/13/16 132 lb (59.875 kg)     Cardiac Studies Reviewed: Echo pending  ASSESSMENT AND PLAN: 1.  Aortic valve disease s/p TAVR: NYHA I sx's.  The patient has done very well. He does have a diastolic heart murmur on exam suggestive of aortic insufficiency. I will review his echo as soon as it is available. This is been noted in the past. Fortunately he is completely asymptomatic and appears to have had excellent improvement in his functional capacity since undergoing TAVR.   2. Coronary artery disease, native vessel: No symptoms of angina. Continue medical therapy.  3. Paroxysmal atrial fibrillation: Maintaining sinus rhythm on amiodarone. Tolerating anticoagulation with warfarin. Lab monitoring his check today for amiodarone surveillance.  4.  Chronic systolic heart failure, NYHA functional class I: the patient will continue his current medical program. Medications are reviewed today and no changes made. 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  5. Essential hypertension: continue losartan and amlodipine  Current medicines are reviewed with the patient today.  The patient does not have  concerns regarding medicines.  Labs/ tests ordered today include:  No orders of the defined types were placed in this encounter.    Disposition:   FU 6 months  Signed, [name], MD  03/11/2016 9:30 AM    [facility] [address], [place], [place]  [address] Phone: [phone]; Fax: [phone]

## 2016-03-21 ENCOUNTER — Ambulatory Visit (INDEPENDENT_AMBULATORY_CARE_PROVIDER_SITE_OTHER): Payer: Medicare Other | Admitting: Family Medicine

## 2016-03-21 DIAGNOSIS — Z0000 Encounter for general adult medical examination without abnormal findings: Secondary | ICD-10-CM | POA: Diagnosis not present

## 2016-03-21 MED ORDER — AMOXICILLIN 400 MG/5ML PO SUSR: ORAL | Status: DC

## 2016-03-21 NOTE — Progress Notes (Signed)
Subjective:    Patient ID: Jesus [name], male    DOB: 09-Oct-1941, 75 y.o.   MRN: [id number]  HPI  Patient here for physical exam.  Chronic problems include history of aortic stenosis with prior aortic valve replacement, CAD with CABG 1997, hyperlipidemia, GERD, hypertension, chronic Coumadin therapy. Stays quite active. He had colonoscopy couple weeks ago with one hyperplastic polyp and one adenomatous polyp. He is now back on Coumadin. No bleeding complications. Medications reviewed. Blood pressure has been well controlled. No headaches. No dizziness. No chest pains. Immunizations up-to-date.  Past Medical History  Diagnosis Date  . Esophageal reflux   . Atrial fibrillation (HCC)     holding sinus rhythm on Amiodarone  . Aortic stenosis     mild with a mean aortic valve gradient of 12 mmHg  . CAD (coronary artery disease)     a. S/P Ant MI 1980;  b. 1997 S/P CABG x 8 (LIMA to diag-LAD, SVG to OM1-OM2-OM3, SVG to [facility] - Dr [name]);  c. 01/2015 Cath: 3VD, 8/8 patent grafts.  [name] Unspecified essential hypertension   . History of transesophageal echocardiography (TEE) for monitoring   . Cardiomyopathy   . Other and unspecified hyperlipidemia   . Skin lesions, generalized     facial which may represent actinic keratoses and possible photosensitivity from Amiodarone  . GERD (gastroesophageal reflux disease)   . History of colonoscopy   . 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 Dysrhythmia   . Headache   . S/P TAVR (transcatheter aortic valve replacement)     a. 03/2015 26 mm Edwards Sapien 3 transcatheter heart valve placed via open left transfemoral approach.   Past Surgical History  Procedure Laterality Date  . Tonsillectomy    . Coronary artery bypass graft  02/09/1996    LIMA to diag-LAD, SVG to OM1-OM2-OM3, SVG to [facility]  . Cholecystectomy    . Cardioversion  11/18/2006    Dr. [name]  . Cataract extraction w/ intraocular lens implant  April '13   (Dr. [name])    left eye only  . Left and right heart catheterization with coronary/graft angiogram N/A 01/26/2015    Procedure: LEFT AND RIGHT HEART CATHETERIZATION WITH [name];  Surgeon: [name], MD;  Location: [facility] CATH LAB;  Service: Cardiovascular;  Laterality: N/A;  . Cardiac catheterization    . Eye surgery    . Transcatheter aortic valve replacement, transfemoral N/A 03/10/2015    Procedure: TRANSCATHETER AORTIC VALVE REPLACEMENT, TRANSFEMORAL;  Surgeon: [name], MD;  Location: [place];  Service: Open Heart Surgery;  Laterality: N/A;  . Tee without cardioversion N/A 03/10/2015    Procedure: TRANSESOPHAGEAL ECHOCARDIOGRAM (TEE);  Surgeon: [name], MD;  Location: [place];  Service: Open Heart Surgery;  Laterality: N/A;    reports that he has never smoked. He has never used smokeless tobacco. He reports that he does not drink alcohol or use illicit drugs. family history includes Atrial fibrillation in his brother; Crohn's disease in his mother; Heart attack (age of onset: 92) in his father; Heart disease in his father and paternal uncle; Heart failure in his father; Prostate cancer in his paternal uncle; Stroke (age of onset: 66) in his mother. There is no history of Colon cancer. Allergies  Allergen Reactions  . Codeine Other (See Comments)    Pt does not remember  . Iodine Swelling      Review of Systems  Constitutional: Negative for fatigue.  Eyes: Negative for visual disturbance.  Respiratory: Negative for cough, chest tightness and shortness of breath.   Cardiovascular: Negative for chest pain, palpitations and leg swelling.  Neurological: Negative for dizziness, syncope, weakness, light-headedness and headaches.       Objective:   Physical Exam  Constitutional: He is oriented to person, place, and time. He appears well-developed and well-nourished.  HENT:  Right Ear: External ear normal.  Left Ear: External ear normal.  Mouth/Throat:  Oropharynx is clear and moist.  Eyes: Pupils are equal, round, and reactive to light.  Neck: Neck supple. No thyromegaly present.  Cardiovascular: Normal rate and regular rhythm.   Pulmonary/Chest: Effort normal and breath sounds normal. No respiratory distress. He has no wheezes. He has no rales.  Abdominal: Soft. Bowel sounds are normal. He exhibits no distension and no mass. There is no tenderness. There is no rebound and no guarding.  Musculoskeletal: He exhibits no edema.  Lymphadenopathy:    He has no cervical adenopathy.  Neurological: He is alert and oriented to person, place, and time. No cranial nerve deficit.  Skin: No rash noted.  Psychiatric: He has a normal mood and affect. His behavior is normal.          Assessment & Plan:   Physical exam. Recent labs per cardiology reviewed with patient. No major abnormalities. Recent colonoscopy as above. We prescribed amoxicillin suspension as he cannot swallow tablets well- to take 2000 mg one hour prior to dental procedure.

## 2016-03-21 NOTE — Progress Notes (Signed)
Pre visit review using our clinic review tool, if applicable. No additional management support is needed unless otherwise documented below in the visit note. 

## 2016-04-06 ENCOUNTER — Telehealth: Payer: Self-pay | Admitting: Gastroenterology

## 2016-04-06 NOTE — Telephone Encounter (Signed)
At least one of the polyps removed during your recent procedure was proven to be adenomatous.  Usually, routine screening/surveillance colonoscopy would be recommended to be done in about 5 years.  However since colon cancer screening tests generally stop between ages 46 and 91, I will leave it to you and your primary care physician to contact my office at that time if it is felt that colon cancer screening is still an important issue for you.   The patient has been notified of this information and all questions answered.

## 2016-04-22 ENCOUNTER — Ambulatory Visit: Payer: Medicare Other

## 2016-04-25 ENCOUNTER — Ambulatory Visit (INDEPENDENT_AMBULATORY_CARE_PROVIDER_SITE_OTHER): Payer: Medicare Other | Admitting: General Practice

## 2016-04-25 DIAGNOSIS — I482 Chronic atrial fibrillation, unspecified: Secondary | ICD-10-CM

## 2016-04-25 DIAGNOSIS — Z954 Presence of other heart-valve replacement: Secondary | ICD-10-CM | POA: Diagnosis not present

## 2016-04-25 DIAGNOSIS — Z5181 Encounter for therapeutic drug level monitoring: Secondary | ICD-10-CM | POA: Diagnosis not present

## 2016-04-25 DIAGNOSIS — Z952 Presence of prosthetic heart valve: Secondary | ICD-10-CM

## 2016-04-25 LAB — POCT INR: INR: 3

## 2016-04-25 NOTE — Progress Notes (Signed)
Pre visit review using our clinic review tool, if applicable. No additional management support is needed unless otherwise documented below in the visit note. 

## 2016-04-29 ENCOUNTER — Other Ambulatory Visit: Payer: Self-pay | Admitting: Family Medicine

## 2016-05-05 ENCOUNTER — Emergency Department (HOSPITAL_COMMUNITY)
Admission: EM | Admit: 2016-05-05 | Discharge: 2016-05-05 | Disposition: A | Discharge status: Home / Self Care | Payer: Medicare Other | Attending: Emergency Medicine | Admitting: Emergency Medicine

## 2016-05-05 ENCOUNTER — Encounter (HOSPITAL_COMMUNITY): Payer: Self-pay | Admitting: Emergency Medicine

## 2016-05-05 DIAGNOSIS — I10 Essential (primary) hypertension: Secondary | ICD-10-CM | POA: Insufficient documentation

## 2016-05-05 DIAGNOSIS — Z79899 Other long term (current) drug therapy: Secondary | ICD-10-CM | POA: Diagnosis not present

## 2016-05-05 DIAGNOSIS — Z7982 Long term (current) use of aspirin: Secondary | ICD-10-CM | POA: Diagnosis not present

## 2016-05-05 DIAGNOSIS — Z951 Presence of aortocoronary bypass graft: Secondary | ICD-10-CM | POA: Diagnosis not present

## 2016-05-05 DIAGNOSIS — I2510 Atherosclerotic heart disease of native coronary artery without angina pectoris: Secondary | ICD-10-CM | POA: Insufficient documentation

## 2016-05-05 DIAGNOSIS — S40261A Insect bite (nonvenomous) of right shoulder, initial encounter: Secondary | ICD-10-CM | POA: Insufficient documentation

## 2016-05-05 DIAGNOSIS — I252 Old myocardial infarction: Secondary | ICD-10-CM | POA: Insufficient documentation

## 2016-05-05 DIAGNOSIS — Y929 Unspecified place or not applicable: Secondary | ICD-10-CM | POA: Insufficient documentation

## 2016-05-05 DIAGNOSIS — Y939 Activity, unspecified: Secondary | ICD-10-CM | POA: Insufficient documentation

## 2016-05-05 DIAGNOSIS — W57XXXA Bitten or stung by nonvenomous insect and other nonvenomous arthropods, initial encounter: Secondary | ICD-10-CM | POA: Diagnosis not present

## 2016-05-05 DIAGNOSIS — Z7901 Long term (current) use of anticoagulants: Secondary | ICD-10-CM | POA: Insufficient documentation

## 2016-05-05 DIAGNOSIS — Y999 Unspecified external cause status: Secondary | ICD-10-CM | POA: Diagnosis not present

## 2016-05-05 MED ORDER — DOXYCYCLINE HYCLATE 100 MG PO CAPS: 100.0000 mg | ORAL_CAPSULE | Freq: Two times a day (BID) | ORAL | Status: DC

## 2016-05-05 NOTE — ED Provider Notes (Signed)
CSN: [id number]     Arrival date & time 05/05/16  2130 History   First MD Initiated Contact with Patient 05/05/16 2202     Chief Complaint  Patient presents with  . Tick Removal     (Consider location/radiation/quality/duration/timing/severity/associated sxs/prior Treatment) HPI Comments: Patient here complaining of tick to right scapula which is unsure how long it has been there. He denies any fever, chills, pruritus. He does have a dog at home. No symptoms. No treatment use prior to arrival.  The history is provided by the patient.    Past Medical History  Diagnosis Date  . Esophageal reflux   . Atrial fibrillation (HCC)     holding sinus rhythm on Amiodarone  . Aortic stenosis     mild with a mean aortic valve gradient of 12 mmHg  . CAD (coronary artery disease)     a. S/P Ant MI 1980;  b. 1997 S/P CABG x 8 (LIMA to diag-LAD, SVG to OM1-OM2-OM3, SVG to [facility] - Dr [name]);  c. 01/2015 Cath: 3VD, 8/8 patent grafts.  [name] Unspecified essential hypertension   . History of transesophageal echocardiography (TEE) for monitoring   . Cardiomyopathy   . Other and unspecified hyperlipidemia   . Skin lesions, generalized     facial which may represent actinic keratoses and possible photosensitivity from Amiodarone  . GERD (gastroesophageal reflux disease)   . History of colonoscopy   . 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 Dysrhythmia   . Headache   . S/P TAVR (transcatheter aortic valve replacement)     a. 03/2015 26 mm Edwards Sapien 3 transcatheter heart valve placed via open left transfemoral approach.   Past Surgical History  Procedure Laterality Date  . Tonsillectomy    . Coronary artery bypass graft  02/09/1996    LIMA to diag-LAD, SVG to OM1-OM2-OM3, SVG to [facility]  . Cholecystectomy    . Cardioversion  11/18/2006    Dr. [name]  . Cataract extraction w/ intraocular lens implant  April '13  (Dr. [name])    left eye only  . Left and right  heart catheterization with coronary/graft angiogram N/A 01/26/2015    Procedure: LEFT AND RIGHT HEART CATHETERIZATION WITH [name];  Surgeon: [name], MD;  Location: [facility] CATH LAB;  Service: Cardiovascular;  Laterality: N/A;  . Cardiac catheterization    . Eye surgery    . Transcatheter aortic valve replacement, transfemoral N/A 03/10/2015    Procedure: TRANSCATHETER AORTIC VALVE REPLACEMENT, TRANSFEMORAL;  Surgeon: [name], MD;  Location: [place];  Service: Open Heart Surgery;  Laterality: N/A;  . Tee without cardioversion N/A 03/10/2015    Procedure: TRANSESOPHAGEAL ECHOCARDIOGRAM (TEE);  Surgeon: [name], MD;  Location: [place];  Service: Open Heart Surgery;  Laterality: N/A;   Family History  Problem Relation Age of Onset  . Stroke Mother 68    Deceased  . Crohn's disease Mother     Deceased  . Heart attack Father 59    Deceased  . Heart disease Father   . Heart failure Father     Deceased  . Atrial fibrillation Brother   . Heart disease Paternal Uncle   . Prostate cancer Paternal Uncle   . Colon cancer Neg Hx    Social History  Substance Use Topics  . Smoking status: Never Smoker   . Smokeless tobacco: Never Used     Comment: Does not smoke.  [name] Alcohol Use: No    Review of Systems  All other systems reviewed and are negative.     Allergies  Codeine and Iodine  Home Medications   Prior to Admission medications   Medication Sig Start Date End Date Taking? Authorizing Provider  acetaminophen (TYLENOL) 500 MG tablet Take 500 mg by mouth every 6 (six) hours as needed for moderate pain.    Historical Provider, MD  amiodarone (PACERONE) 200 MG tablet Take 0.5 tablets (100 mg total) by mouth daily. 02/01/16   [name], MD  amLODipine (NORVASC) 5 MG tablet Take 1 tablet (5 mg total) by mouth daily. 06/30/15   [name], MD  amoxicillin (AMOXIL) 400 MG/5ML suspension Take 5 tsp po one hour prior to dental procedure 03/21/16   [name], MD  Ascorbic Acid (VITAMIN C) 1000 MG tablet Take 1,000 mg by mouth daily.      Historical Provider, MD  aspirin 81 MG tablet Take 81 mg by mouth daily.      Historical Provider, MD  atorvastatin (LIPITOR) 80 MG tablet TAKE 1/2 TABLET BY MOUTH DAILY 10/05/15   [name], MD  doxycycline (VIBRAMYCIN) 100 MG capsule Take 1 capsule (100 mg total) by mouth 2 (two) times daily. 05/05/16   Lacretia Leigh, MD  loratadine (CLARITIN) 10 MG tablet Take 10 mg by mouth daily as needed for allergies.     Historical Provider, MD  losartan (COZAAR) 100 MG tablet TAKE 1 TABLET BY MOUTH DAILY 01/14/16   [name], MD  Multiple Vitamins-Minerals (MULTIVITAMIN,TX-MINERALS) tablet Take 1 tablet by mouth daily.      Historical Provider, MD  pantoprazole (PROTONIX) 40 MG tablet TAKE 1 TABLET BY MOUTH ONCE DAILY. 11/11/15   [name], MD  traMADol (ULTRAM) 50 MG tablet Take 1-2 tablets (50-100 mg total) by mouth every 4 (four) hours as needed for moderate pain. 03/12/15   Erlene Quan, PA-C  warfarin (COUMADIN) 5 MG tablet TAKE AS DIRECTED BY COUMADIN CLINIC. 04/29/16   [name], MD   BP 153/60 mmHg  Pulse 62  Temp(Src) 97.8 F (36.6 C) (Oral)  Resp 14  Ht 5\' 2"  (1.575 m)  Wt 58.06 kg  BMI 23.41 kg/m2  SpO2 100% Physical Exam  Constitutional: He is oriented to person, place, and time. He appears well-developed and well-nourished.  Non-toxic appearance. No distress.  HENT:  Head: Normocephalic and atraumatic.  Eyes: Conjunctivae, EOM and lids are normal. Pupils are equal, round, and reactive to light.  Neck: Normal range of motion. Neck supple. No tracheal deviation present. No thyroid mass present.  Cardiovascular: Normal rate, regular rhythm and normal heart sounds.  Exam reveals no gallop.   No murmur heard. Pulmonary/Chest: Effort normal and breath sounds normal. No stridor. No respiratory distress. He has no decreased breath sounds. He has no wheezes. He has no rhonchi.  He has no rales.  Abdominal: Soft. Normal appearance and bowel sounds are normal. He exhibits no distension. There is no tenderness. There is no rebound and no CVA tenderness.  Musculoskeletal: Normal range of motion. He exhibits no edema or tenderness.       Arms: Neurological: He is alert and oriented to person, place, and time. He has normal strength. No cranial nerve deficit or sensory deficit. GCS eye subscore is 4. GCS verbal subscore is 5. GCS motor subscore is 6.  Skin: Skin is warm and dry. No abrasion and no rash noted.  Psychiatric: He has a normal mood and affect. His speech is normal and behavior is  normal.  Nursing note and vitals reviewed.   ED Course  Procedures (including critical care time) Labs Review Labs Reviewed - No data to display  Imaging Review No results found. I have personally reviewed and evaluated these images and lab results as part of my medical decision-making.   EKG Interpretation None      MDM   Final diagnoses:  Tick bite with subsequent removal of tick    Tick removed with forceps. All body parts accounted for. Given prescription for antibiotics and will start taking if he has symptoms    Lacretia Leigh, MD 05/05/16 2236

## 2016-05-05 NOTE — ED Notes (Addendum)
Patient presents for tick removal to left scapula. Noticed tick after showering this evening. Denies fever and other c/c.

## 2016-05-05 NOTE — Discharge Instructions (Signed)
Only start taking the antibiotic if you develop a rash, fever, or just don't feel well. If you do start the doxycycline, contact your doctor so that he can adjust your dose of Coumadin Tick Bite Information Ticks are insects that attach themselves to the skin and draw blood for food. There are various types of ticks. Common types include wood ticks and deer ticks. Most ticks live in shrubs and grassy areas. Ticks can climb onto your body when you make contact with leaves or grass where the tick is waiting. The most common places on the body for ticks to attach themselves are the scalp, neck, armpits, waist, and groin. Most tick bites are harmless, but sometimes ticks carry germs that cause diseases. These germs can be spread to a person during the tick's feeding process. The chance of a disease spreading through a tick bite depends on:   The type of tick.  Time of year.   How long the tick is attached.   Geographic location.  HOW CAN YOU PREVENT TICK BITES? Take these steps to help prevent tick bites when you are outdoors:  Wear protective clothing. Long sleeves and long pants are best.   Wear white clothes so you can see ticks more easily.  Tuck your pant legs into your socks.   If walking on a trail, stay in the middle of the trail to avoid brushing against bushes.  Avoid walking through areas with long grass.  Put insect repellent on all exposed skin and along boot tops, pant legs, and sleeve cuffs.   Check clothing, hair, and skin repeatedly and before going inside.   Brush off any ticks that are not attached.  Take a shower or bath as soon as possible after being outdoors.  WHAT IS THE PROPER WAY TO REMOVE A TICK? Ticks should be removed as soon as possible to help prevent diseases caused by tick bites. 1. If latex gloves are available, put them on before trying to remove a tick.  2. Using fine-point tweezers, grasp the tick as close to the skin as possible. You may  also use curved forceps or a tick removal tool. Grasp the tick as close to its head as possible. Avoid grasping the tick on its body. 3. Pull gently with steady upward pressure until the tick lets go. Do not twist the tick or jerk it suddenly. This may break off the tick's head or mouth parts. 4. Do not squeeze or crush the tick's body. This could force disease-carrying fluids from the tick into your body.  5. After the tick is removed, wash the bite area and your hands with soap and water or other disinfectant such as alcohol. 6. Apply a small amount of antiseptic cream or ointment to the bite site.  7. Wash and disinfect any instruments that were used.  Do not try to remove a tick by applying a hot match, petroleum jelly, or fingernail polish to the tick. These methods do not work and may increase the chances of disease being spread from the tick bite.  WHEN SHOULD YOU SEEK MEDICAL CARE? Contact your health care provider if you are unable to remove a tick from your skin or if a part of the tick breaks off and is stuck in the skin.  After a tick bite, you need to be aware of signs and symptoms that could be related to diseases spread by ticks. Contact your health care provider if you develop any of the following in the days  or weeks after the tick bite:  Unexplained fever.  Rash. A circular rash that appears days or weeks after the tick bite may indicate the possibility of Lyme disease. The rash may resemble a target with a bull's-eye and may occur at a different part of your body than the tick bite.  Redness and swelling in the area of the tick bite.   Tender, swollen lymph glands.   Diarrhea.   Weight loss.   Cough.   Fatigue.   Muscle, joint, or bone pain.   Abdominal pain.   Headache.   Lethargy or a change in your level of consciousness.  Difficulty walking or moving your legs.   Numbness in the legs.   Paralysis.  Shortness of breath.   Confusion.    Repeated vomiting.    This information is not intended to replace advice given to you by your health care provider. Make sure you discuss any questions you have with your health care provider.   Document Released: 11/18/2000 Document Revised: 12/12/2014 Document Reviewed: 05/01/2013 Elsevier Interactive Patient Education [employer].

## 2016-05-11 ENCOUNTER — Telehealth: Payer: Self-pay | Admitting: Family Medicine

## 2016-05-11 NOTE — Telephone Encounter (Signed)
Can we get this pt a f/u appt to discuss---plus he should have the area looked at again.

## 2016-05-11 NOTE — Telephone Encounter (Signed)
lmom for pt to call office to make appt.

## 2016-05-11 NOTE — Telephone Encounter (Addendum)
Pt went to the ER 6/1 [facility] to get tick dug out on L shoulder and want to know if he should take Doxycycline 100mg  for the pain due to him being on Coumadin.  Refuse the team health.

## 2016-05-12 ENCOUNTER — Ambulatory Visit (INDEPENDENT_AMBULATORY_CARE_PROVIDER_SITE_OTHER): Payer: Medicare Other | Admitting: Family Medicine

## 2016-05-12 DIAGNOSIS — S20462D Insect bite (nonvenomous) of left back wall of thorax, subsequent encounter: Secondary | ICD-10-CM

## 2016-05-12 DIAGNOSIS — W57XXXA Bitten or stung by nonvenomous insect and other nonvenomous arthropods, initial encounter: Secondary | ICD-10-CM | POA: Diagnosis not present

## 2016-05-12 NOTE — Patient Instructions (Signed)
Follow up for any fever, headache, or new rash.

## 2016-05-12 NOTE — Progress Notes (Signed)
Subjective:    Patient ID: Jesus [name], male    DOB: [birth year], 75 y.o.   MRN: [id number]  HPI Patient here with recent tick bite. He actually went to the emergency room week ago for this and tick was removed then. He called up yesterday with several questions and was told to come in to discuss further. He noticed a tick about a week ago. He describes this as a larger "would tick " Tick was removed in the emergency department in entirety. He's had some mild surrounding erythema but nontender. No significant pruritus. He has not noted any other rash. No fever. No headache. No increased arthralgias. Patient received prescription for doxycycline but never got this filled  Past Medical History  Diagnosis Date  . Esophageal reflux   . Atrial fibrillation (HCC)     holding sinus rhythm on Amiodarone  . Aortic stenosis     mild with a mean aortic valve gradient of 12 mmHg  . CAD (coronary artery disease)     a. S/P Ant MI 1980;  b. 1997 S/P CABG x 8 (LIMA to diag-LAD, SVG to OM1-OM2-OM3, SVG to [facility] - Dr [name]);  c. 01/2015 Cath: 3VD, 8/8 patent grafts.  [name] Unspecified essential hypertension   . History of transesophageal echocardiography (TEE) for monitoring   . Cardiomyopathy   . Other and unspecified hyperlipidemia   . Skin lesions, generalized     facial which may represent actinic keratoses and possible photosensitivity from Amiodarone  . GERD (gastroesophageal reflux disease)   . History of colonoscopy   . 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 Dysrhythmia   . Headache   . S/P TAVR (transcatheter aortic valve replacement)     a. 03/2015 26 mm Edwards Sapien 3 transcatheter heart valve placed via open left transfemoral approach.   Past Surgical History  Procedure Laterality Date  . Tonsillectomy    . Coronary artery bypass graft  02/09/1996    LIMA to diag-LAD, SVG to OM1-OM2-OM3, SVG to [facility]  . Cholecystectomy    . Cardioversion   11/18/2006    Dr. [name]  . Cataract extraction w/ intraocular lens implant  April '13  (Dr. [name])    left eye only  . Left and right heart catheterization with coronary/graft angiogram N/A 01/26/2015    Procedure: LEFT AND RIGHT HEART CATHETERIZATION WITH [name];  Surgeon: [name], MD;  Location: [facility] CATH LAB;  Service: Cardiovascular;  Laterality: N/A;  . Cardiac catheterization    . Eye surgery    . Transcatheter aortic valve replacement, transfemoral N/A 03/10/2015    Procedure: TRANSCATHETER AORTIC VALVE REPLACEMENT, TRANSFEMORAL;  Surgeon: [name], MD;  Location: [place];  Service: Open Heart Surgery;  Laterality: N/A;  . Tee without cardioversion N/A 03/10/2015    Procedure: TRANSESOPHAGEAL ECHOCARDIOGRAM (TEE);  Surgeon: [name], MD;  Location: [place];  Service: Open Heart Surgery;  Laterality: N/A;    reports that he has never smoked. He has never used smokeless tobacco. He reports that he does not drink alcohol or use illicit drugs. family history includes Atrial fibrillation in his brother; Crohn's disease in his mother; Heart attack (age of onset: 55) in his father; Heart disease in his father and paternal uncle; Heart failure in his father; Prostate cancer in his paternal uncle; Stroke (age of onset: 32) in his mother. There is no history of Colon cancer. Allergies  Allergen Reactions  . Codeine Other (See Comments)  Pt does not remember  . Iodine Swelling      Review of Systems  Constitutional: Negative for fever and chills.  Gastrointestinal: Negative for nausea and vomiting.  Musculoskeletal: Negative for arthralgias.  Neurological: Negative for headaches.       Objective:   Physical Exam  Constitutional: He appears well-developed and well-nourished.  Cardiovascular: Normal rate.   Pulmonary/Chest: Effort normal and breath sounds normal. He has no wheezes. He has no rales.  Skin:  Left upper back-2 x 2 centimeter area of  erythema which is nontender. Slightly indurated center. No visible retained tick parts. No fluctuance.          Assessment & Plan:  Recent tick bite with local allergic response. No indication for antibiotics at this point unless he develops any fever, new rash, severe headache or other symptoms. Review signs and symptoms of both [facility] spotted fever and Lyme disease.  He is aware that he may have some allergic irritation for several weeks  [name] MD [facility]

## 2016-05-12 NOTE — Telephone Encounter (Signed)
Pt has been scheduled.  °

## 2016-05-12 NOTE — Progress Notes (Signed)
Pre visit review using our clinic review tool, if applicable. No additional management support is needed unless otherwise documented below in the visit note. 

## 2016-05-27 ENCOUNTER — Encounter: Payer: Self-pay | Admitting: Family Medicine

## 2016-05-27 ENCOUNTER — Ambulatory Visit (INDEPENDENT_AMBULATORY_CARE_PROVIDER_SITE_OTHER): Payer: Medicare Other | Admitting: Family Medicine

## 2016-05-27 VITALS — BP 120/62 | HR 60 | Temp 97.9°F | Ht 62.0 in | Wt 132.0 lb

## 2016-05-27 DIAGNOSIS — S5011XA Contusion of right forearm, initial encounter: Secondary | ICD-10-CM | POA: Diagnosis not present

## 2016-05-27 NOTE — Progress Notes (Signed)
Subjective:    Patient ID: Jesus [name], male    DOB: 1941-05-23, 75 y.o.   MRN: [id number]  HPI Acute visit for bruising of the right arm and forearm. Just noted last night. He does not recall any specific injury but thinks he might have hit his elbow when trying to raise a window pane.  He has absolutely no pain whatsoever. He is on Coumadin with INR 3.0 back in May. He's had absolutely no bleeding complications otherwise such as nosebleeds, hematuria, hematochezia. Other than some mild incidental bruising on his hands and forearms no other bruising.  Past Medical History  Diagnosis Date  . Esophageal reflux   . Atrial fibrillation (HCC)     holding sinus rhythm on Amiodarone  . Aortic stenosis     mild with a mean aortic valve gradient of 12 mmHg  . CAD (coronary artery disease)     a. S/P Ant MI 1980;  b. 1997 S/P CABG x 8 (LIMA to diag-LAD, SVG to OM1-OM2-OM3, SVG to [facility] - Dr [name]);  c. 01/2015 Cath: 3VD, 8/8 patent grafts.  [name] Unspecified essential hypertension   . History of transesophageal echocardiography (TEE) for monitoring   . Cardiomyopathy   . Other and unspecified hyperlipidemia   . Skin lesions, generalized     facial which may represent actinic keratoses and possible photosensitivity from Amiodarone  . GERD (gastroesophageal reflux disease)   . History of colonoscopy   . 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 Dysrhythmia   . Headache   . S/P TAVR (transcatheter aortic valve replacement)     a. 03/2015 26 mm Edwards Sapien 3 transcatheter heart valve placed via open left transfemoral approach.   Past Surgical History  Procedure Laterality Date  . Tonsillectomy    . Coronary artery bypass graft  02/09/1996    LIMA to diag-LAD, SVG to OM1-OM2-OM3, SVG to [facility]  . Cholecystectomy    . Cardioversion  11/18/2006    Dr. [name]  . Cataract extraction w/ intraocular lens implant  April '13  (Dr. [name])    left eye only  .  Left and right heart catheterization with coronary/graft angiogram N/A 01/26/2015    Procedure: LEFT AND RIGHT HEART CATHETERIZATION WITH [name];  Surgeon: [name], MD;  Location: [facility] CATH LAB;  Service: Cardiovascular;  Laterality: N/A;  . Cardiac catheterization    . Eye surgery    . Transcatheter aortic valve replacement, transfemoral N/A 03/10/2015    Procedure: TRANSCATHETER AORTIC VALVE REPLACEMENT, TRANSFEMORAL;  Surgeon: [name], MD;  Location: [place];  Service: Open Heart Surgery;  Laterality: N/A;  . Tee without cardioversion N/A 03/10/2015    Procedure: TRANSESOPHAGEAL ECHOCARDIOGRAM (TEE);  Surgeon: [name], MD;  Location: [place];  Service: Open Heart Surgery;  Laterality: N/A;    reports that he has never smoked. He has never used smokeless tobacco. He reports that he does not drink alcohol or use illicit drugs. family history includes Atrial fibrillation in his brother; Crohn's disease in his mother; Heart attack (age of onset: 56) in his father; Heart disease in his father and paternal uncle; Heart failure in his father; Prostate cancer in his paternal uncle; Stroke (age of onset: 42) in his mother. There is no history of Colon cancer. Allergies  Allergen Reactions  . Codeine Other (See Comments)    Pt does not remember  . Iodine Swelling      Review of Systems  Neurological: Negative for  weakness.       Objective:   Physical Exam  Constitutional: He appears well-developed and well-nourished.  Cardiovascular: Normal rate.   Pulmonary/Chest: Effort normal and breath sounds normal. No respiratory distress. He has no wheezes. He has no rales.  Musculoskeletal:  Right arm reveals fairly extensive bruising along the medial aspect over much of the forearm. He also has some mild bruising and mild swelling of the right olecranon bursa. He has some mild bruising of the posterior aspect of the right arm about 3 cm above the elbow. Full range of  motion elbow. No pain with suppuration or pronation. Full strength upper extremity. No bony tenderness whatsoever. No hematoma. No breaks in the skin          Assessment & Plan:  Bruising of the right arm and forearm with evidence for inflammation of the right olecranon bursa. He does not recall any trauma. He has no bony tenderness whatsoever and very low suspicion for fracture. He is on Coumadin. We've advised that he look for any other bleeding issues. He is scheduled for repeat INR but over week. Follow-up sooner for any recurrent non-provoked bruising  [name] MD [facility]

## 2016-05-27 NOTE — Progress Notes (Signed)
Pre visit review using our clinic review tool, if applicable. No additional management support is needed unless otherwise documented below in the visit note. 

## 2016-05-27 NOTE — Patient Instructions (Signed)
Follow up for any recurrent bruising or other sources of bleeding such as stool, urine, gums, nose bleed.

## 2016-05-30 ENCOUNTER — Ambulatory Visit (INDEPENDENT_AMBULATORY_CARE_PROVIDER_SITE_OTHER): Payer: Medicare Other | Admitting: General Practice

## 2016-05-30 ENCOUNTER — Telehealth: Payer: Self-pay | Admitting: Family Medicine

## 2016-05-30 ENCOUNTER — Encounter: Payer: Self-pay | Admitting: Family Medicine

## 2016-05-30 ENCOUNTER — Ambulatory Visit (INDEPENDENT_AMBULATORY_CARE_PROVIDER_SITE_OTHER): Payer: Medicare Other | Admitting: Family Medicine

## 2016-05-30 VITALS — BP 140/60 | HR 68 | Temp 97.8°F | Ht 62.0 in | Wt 131.9 lb

## 2016-05-30 DIAGNOSIS — Z5181 Encounter for therapeutic drug level monitoring: Secondary | ICD-10-CM

## 2016-05-30 DIAGNOSIS — R233 Spontaneous ecchymoses: Secondary | ICD-10-CM

## 2016-05-30 DIAGNOSIS — I482 Chronic atrial fibrillation, unspecified: Secondary | ICD-10-CM

## 2016-05-30 DIAGNOSIS — Z952 Presence of prosthetic heart valve: Secondary | ICD-10-CM

## 2016-05-30 DIAGNOSIS — Z954 Presence of other heart-valve replacement: Secondary | ICD-10-CM | POA: Diagnosis not present

## 2016-05-30 DIAGNOSIS — Z7901 Long term (current) use of anticoagulants: Secondary | ICD-10-CM

## 2016-05-30 LAB — POCT INR: INR: 1.4

## 2016-05-30 NOTE — Progress Notes (Signed)
Pre visit review using our clinic review tool, if applicable. No additional management support is needed unless otherwise documented below in the visit note. 

## 2016-05-30 NOTE — Telephone Encounter (Signed)
Patient Name: Jesus [name] DOB: Nov 23, 1941 Initial Comment Caller states his right arm is turning black after hitting elbow Nurse Assessment Nurse: Vallery Sa, RN, Cathy Date/Time (Eastern Time): 05/30/2016 9:13:17 AM Confirm and document reason for call. If symptomatic, describe symptoms. You must click the next button to save text entered. ---Caller states he bruised his right elbow about 5 days ago. No fever. No pain. Alert and responsive. Has the patient traveled out of the country within the last 30 days? ---No Does the patient have any new or worsening symptoms? ---Yes Will a triage be completed? ---Yes Related visit to physician within the last 2 weeks? ---No Does the PT have any chronic conditions? (i.e. diabetes, asthma, etc.) ---Yes List chronic conditions. ---Aortic Valve replacement about 2 years ago (he takes blood thinners), high Blood Pressure Is this a behavioral health or substance abuse call? ---No Guidelines Guideline Title Affirmed Question Affirmed Notes Bruises Taking Coumadin (warfarin) or other strong blood thinner, or known bleeding disorder (e.g., thrombocytopenia) Final Disposition User See Physician within [place], RN, Cathy Comments Scheduled for 3:45pm appointment today with Dr. Elease Hashimoto. Referrals REFERRED TO PCP OFFICE Disagree/Comply: Comply

## 2016-05-30 NOTE — Progress Notes (Signed)
Subjective:    Patient ID: Jesus [name], male    DOB: 1941/07/19, 75 y.o.   MRN: [id number]  HPI  Patient recently seen with right upper extremity bleed. He denies any specific injury He had fairly extensive ecchymosis of the forearm and basically came in today because he had had some extension over the weekend down to the hand. Has never had any associated pain. Full range of motion all digits. He takes Coumadin and INR today this afternoon 1.4. No other bleeding issues.  He has continue to do some upper body weights with light amounts. No pain whatsoever.  Past Medical History  Diagnosis Date  . Esophageal reflux   . Atrial fibrillation (HCC)     holding sinus rhythm on Amiodarone  . Aortic stenosis     mild with a mean aortic valve gradient of 12 mmHg  . CAD (coronary artery disease)     a. S/P Ant MI 1980;  b. 1997 S/P CABG x 8 (LIMA to diag-LAD, SVG to OM1-OM2-OM3, SVG to [facility] - Dr [name]);  c. 01/2015 Cath: 3VD, 8/8 patent grafts.  [name] Unspecified essential hypertension   . History of transesophageal echocardiography (TEE) for monitoring   . Cardiomyopathy   . Other and unspecified hyperlipidemia   . Skin lesions, generalized     facial which may represent actinic keratoses and possible photosensitivity from Amiodarone  . GERD (gastroesophageal reflux disease)   . History of colonoscopy   . 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 Dysrhythmia   . Headache   . S/P TAVR (transcatheter aortic valve replacement)     a. 03/2015 26 mm Edwards Sapien 3 transcatheter heart valve placed via open left transfemoral approach.   Past Surgical History  Procedure Laterality Date  . Tonsillectomy    . Coronary artery bypass graft  02/09/1996    LIMA to diag-LAD, SVG to OM1-OM2-OM3, SVG to [facility]  . Cholecystectomy    . Cardioversion  11/18/2006    Dr. [name]  . Cataract extraction w/ intraocular lens implant  April '13  (Dr. [name])    left eye  only  . Left and right heart catheterization with coronary/graft angiogram N/A 01/26/2015    Procedure: LEFT AND RIGHT HEART CATHETERIZATION WITH [name];  Surgeon: [name], MD;  Location: [facility] C[facility] LAB;  Service: Cardiovascular;  Laterality: N/A;  . Cardiac catheterization    . Eye surgery    . Transcatheter aortic valve replacement, transfemoral N/A 03/10/2015    Procedure: TRANSCATHETER AORTIC VALVE REPLACEMENT, TRANSFEMORAL;  Surgeon: [name], MD;  Location: [place];  Service: Open Heart Surgery;  Laterality: N/A;  . Tee without cardioversion N/A 03/10/2015    Procedure: TRANSESOPHAGEAL ECHOCARDIOGRAM (TEE);  Surgeon: [name], MD;  Location: [address];  Service: Open Heart Surgery;  Laterality: N/A;    reports that he has never smoked. He has never used smokeless tobacco. He reports that he does not drink alcohol or use illicit drugs. family history includes Atrial fibrillation in his brother; Crohn's disease in his mother; Heart attack (age of onset: 25) in his father; Heart disease in his father and paternal uncle; Heart failure in his father; Prostate cancer in his paternal uncle; Stroke (age of onset: 35) in his mother. There is no history of Colon cancer. Allergies  Allergen Reactions  . Codeine Other (See Comments)    Pt does not remember  . Iodine Swelling      Review of Systems  Neurological:  Negative for weakness and numbness.       Objective:   Physical Exam  Constitutional: He appears well-developed and well-nourished.  Cardiovascular: Normal rate and regular rhythm.   Pulmonary/Chest: Effort normal and breath sounds normal. No respiratory distress. He has no wheezes. He has no rales.  Skin:  Extensive ecchymosis involving the flexor surface of the right forearm. No hematoma. No bony tenderness. Full range of motion right elbow right wrist and right hand. He has minimal right olecranon bursa swelling but nontender. No warmth.            Assessment & Plan:  Extensive ecchymosis right forearm.  No reported injury.  INR 1.4 (on coumadin). No hematoma. No evidence for active bleeding. He does not have any worrisome edema or risk for compartment syndrome. Suspect bleeding progression over the weekend was extension of what had already occurred last visit and this showed up later around the hand region. Reassurance. We are recommending layoff any heavy weight lifting for the next few days  [name] MD [facility] [place] .

## 2016-06-03 ENCOUNTER — Ambulatory Visit: Payer: Medicare Other

## 2016-06-16 ENCOUNTER — Telehealth: Payer: Self-pay | Admitting: Cardiovascular Disease

## 2016-06-16 NOTE — Telephone Encounter (Signed)
Note not needed 

## 2016-06-22 ENCOUNTER — Telehealth: Payer: Self-pay | Admitting: Cardiovascular Disease

## 2016-06-22 NOTE — Telephone Encounter (Signed)
New Message  Pt requested to speak w/ RN about scheduling appt in Oct- offered to sched next avail in October w/ [name]- pt refused and requested to speak w/ RN. Please call back and discuss.

## 2016-06-22 NOTE — Telephone Encounter (Signed)
I spoke with the pt and scheduled follow-up in October.

## 2016-06-29 ENCOUNTER — Other Ambulatory Visit: Payer: Self-pay

## 2016-06-29 MED ORDER — AMLODIPINE BESYLATE 5 MG PO TABS: 5.0000 mg | ORAL_TABLET | Freq: Every day | ORAL | 1 refills | Status: DC

## 2016-06-29 NOTE — Telephone Encounter (Signed)
Latricia Heft, MD at 03/11/2016 9:30 AM   amLODipine (NORVASC) 5 MG tablet Take 1 tablet (5 mg total) by mouth daily    5. Essential hypertension: continue losartan and amlodipine  Patient Instructions   Medication Instructions:  Your physician recommends that you continue on your current medications as directed. Please refer to the Current Medication list given to you today.

## 2016-07-01 ENCOUNTER — Ambulatory Visit (INDEPENDENT_AMBULATORY_CARE_PROVIDER_SITE_OTHER): Payer: Medicare Other | Admitting: General Practice

## 2016-07-01 DIAGNOSIS — I482 Chronic atrial fibrillation, unspecified: Secondary | ICD-10-CM

## 2016-07-01 DIAGNOSIS — Z5181 Encounter for therapeutic drug level monitoring: Secondary | ICD-10-CM | POA: Diagnosis not present

## 2016-07-01 DIAGNOSIS — Z952 Presence of prosthetic heart valve: Secondary | ICD-10-CM

## 2016-07-01 DIAGNOSIS — Z954 Presence of other heart-valve replacement: Secondary | ICD-10-CM | POA: Diagnosis not present

## 2016-07-01 LAB — POCT INR: INR: 3.1

## 2016-07-01 NOTE — Progress Notes (Signed)
I have reviewed and agree with the plan. 

## 2016-07-11 ENCOUNTER — Other Ambulatory Visit: Payer: Self-pay | Admitting: Family Medicine

## 2016-07-27 DIAGNOSIS — H04123 Dry eye syndrome of bilateral lacrimal glands: Secondary | ICD-10-CM | POA: Diagnosis not present

## 2016-07-27 DIAGNOSIS — H16103 Unspecified superficial keratitis, bilateral: Secondary | ICD-10-CM | POA: Diagnosis not present

## 2016-07-27 DIAGNOSIS — H25811 Combined forms of age-related cataract, right eye: Secondary | ICD-10-CM | POA: Diagnosis not present

## 2016-07-27 DIAGNOSIS — H01001 Unspecified blepharitis right upper eyelid: Secondary | ICD-10-CM | POA: Diagnosis not present

## 2016-07-29 ENCOUNTER — Ambulatory Visit (INDEPENDENT_AMBULATORY_CARE_PROVIDER_SITE_OTHER): Payer: Medicare Other | Admitting: General Practice

## 2016-07-29 DIAGNOSIS — Z5181 Encounter for therapeutic drug level monitoring: Secondary | ICD-10-CM

## 2016-07-29 DIAGNOSIS — I482 Chronic atrial fibrillation, unspecified: Secondary | ICD-10-CM

## 2016-07-29 DIAGNOSIS — Z954 Presence of other heart-valve replacement: Secondary | ICD-10-CM | POA: Diagnosis not present

## 2016-07-29 DIAGNOSIS — Z952 Presence of prosthetic heart valve: Secondary | ICD-10-CM

## 2016-07-29 LAB — POCT INR: INR: 1.9

## 2016-07-29 NOTE — Progress Notes (Signed)
I have reviewed and agree with the plan. 

## 2016-08-10 ENCOUNTER — Encounter: Payer: Self-pay | Admitting: Family Medicine

## 2016-08-10 ENCOUNTER — Ambulatory Visit (INDEPENDENT_AMBULATORY_CARE_PROVIDER_SITE_OTHER): Payer: Medicare Other | Admitting: Family Medicine

## 2016-08-10 VITALS — BP 150/70 | HR 65 | Temp 98.0°F | Ht 62.0 in | Wt 135.0 lb

## 2016-08-10 DIAGNOSIS — J069 Acute upper respiratory infection, unspecified: Secondary | ICD-10-CM

## 2016-08-10 DIAGNOSIS — B9789 Other viral agents as the cause of diseases classified elsewhere: Principal | ICD-10-CM

## 2016-08-10 NOTE — Progress Notes (Signed)
Subjective:     Patient ID: Jesus [name], male   DOB: September 13, 1941, 75 y.o.   MRN: [id number]  HPI Acute visit for upper respiratory symptoms. Onset 2 days ago of sore throat which has been relatively mild. He also has some mild nasal congestion and cough. No fever. No chills. Mild body aches. Wife is developing similar symptoms. No nausea or vomiting.  Past Medical History:  Diagnosis Date  . 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 Aortic stenosis    mild with a mean aortic valve gradient of 12 mmHg  . Atrial fibrillation (HCC)    holding sinus rhythm on Amiodarone  . CAD (coronary artery disease)    a. S/P Ant MI 1980;  b. 1997 S/P CABG x 8 (LIMA to diag-LAD, SVG to OM1-OM2-OM3, [facility]G to [facility] - Dr [name]);  c. 01/2015 Cath: 3VD, 8/8 patent grafts.  . Cardiomyopathy   . Dysrhythmia   . Esophageal reflux   . GERD (gastroesophageal reflux disease)   . Headache   . History of colonoscopy   . History of transesophageal echocardiography (TEE) for monitoring   . Other and unspecified hyperlipidemia   . S/P TAVR (transcatheter aortic valve replacement)    a. 03/2015 26 mm Edwards Sapien 3 transcatheter heart valve placed via open left transfemoral approach.  . Skin lesions, generalized    facial which may represent actinic keratoses and possible photosensitivity from Amiodarone  . Unspecified essential hypertension    Past Surgical History:  Procedure Laterality Date  . CARDIAC CATHETERIZATION    . CARDIOVERSION  11/18/2006   Dr. [name]  . CATARACT EXTRACTION W/ INTRAOCULAR LENS IMPLANT  April '13  (Dr. [name])   left eye only  . CHOLECYSTECTOMY    . CORONARY ARTERY BYPASS GRAFT  02/09/1996   LIMA to diag-LAD, SVG to OM1-OM2-OM3, SVG to [facility]  . EYE SURGERY    . LEFT AND RIGHT HEART CATHETERIZATION WITH CORONARY/GRAFT ANGIOGRAM N/A 01/26/2015   Procedure: LEFT AND RIGHT HEART CATHETERIZATION WITH [name];  Surgeon: [name],  MD;  Location: [facility] CATH LAB;  Service: Cardiovascular;  Laterality: N/A;  . TEE WITHOUT CARDIOVERSION N/A 03/10/2015   Procedure: TRANSESOPHAGEAL ECHOCARDIOGRAM (TEE);  Surgeon: [name], MD;  Location: [place];  Service: Open Heart Surgery;  Laterality: N/A;  . TONSILLECTOMY    . TRANSCATHETER AORTIC VALVE REPLACEMENT, TRANSFEMORAL N/A 03/10/2015   Procedure: TRANSCATHETER AORTIC VALVE REPLACEMENT, TRANSFEMORAL;  Surgeon: [name], MD;  Location: [place];  Service: Open Heart Surgery;  Laterality: N/A;    reports that he has never smoked. He has never used smokeless tobacco. He reports that he does not drink alcohol or use drugs. family history includes Atrial fibrillation in his brother; Crohn's disease in his mother; Heart attack (age of onset: 70) in his father; Heart disease in his father and paternal uncle; Heart failure in his father; Prostate cancer in his paternal uncle; Stroke (age of onset: 31) in his mother. Allergies  Allergen Reactions  . Codeine Other (See Comments)    Pt does not remember  . Iodine Swelling      Review of Systems  Constitutional: Positive for fatigue. Negative for chills and fever.  HENT: Positive for congestion and sore throat.   Respiratory: Positive for cough.   Gastrointestinal: Negative for nausea and vomiting.       Objective:   Physical Exam  Constitutional: He appears well-developed and well-nourished.  HENT:  Right Ear: External ear normal.  Left  Ear: External ear normal.  Mouth/Throat: Oropharynx is clear and moist.  Neck: Neck supple.  Cardiovascular: Normal rate and regular rhythm.   Pulmonary/Chest: Effort normal and breath sounds normal. No respiratory distress. He has no wheezes. He has no rales.  Lymphadenopathy:    He has no cervical adenopathy.       Assessment:     Viral URI with cough. Nonfocal exam    Plan:     -Stay well-hydrated -Treat cough symptoms symptomatically. -Follow-up promptly for any fever or  increased shortness of breath  [name] MD [facility] at [facility]

## 2016-08-10 NOTE — Progress Notes (Signed)
Pre visit review using our clinic review tool, if applicable. No additional management support is needed unless otherwise documented below in the visit note. 

## 2016-08-10 NOTE — Patient Instructions (Signed)
Follow up for any fever or increased shortness of breath Stay well hydrated.

## 2016-08-16 ENCOUNTER — Telehealth: Payer: Self-pay | Admitting: Family Medicine

## 2016-08-16 NOTE — Telephone Encounter (Signed)
Pt was seen on 08-10-16 and now having diarrhea. Please advise

## 2016-08-17 ENCOUNTER — Telehealth: Payer: Self-pay

## 2016-08-17 ENCOUNTER — Encounter: Payer: Self-pay | Admitting: Family Medicine

## 2016-08-17 ENCOUNTER — Ambulatory Visit (INDEPENDENT_AMBULATORY_CARE_PROVIDER_SITE_OTHER): Payer: Medicare Other | Admitting: Family Medicine

## 2016-08-17 VITALS — BP 122/60 | HR 67 | Temp 98.2°F | Ht 62.0 in | Wt 129.1 lb

## 2016-08-17 DIAGNOSIS — R197 Diarrhea, unspecified: Secondary | ICD-10-CM | POA: Diagnosis not present

## 2016-08-17 NOTE — Progress Notes (Signed)
Pre visit review using our clinic review tool, if applicable. No additional management support is needed unless otherwise documented below in the visit note. 

## 2016-08-17 NOTE — Patient Instructions (Signed)

## 2016-08-17 NOTE — Telephone Encounter (Signed)
Pt is scheduled today for follow up.

## 2016-08-17 NOTE — Telephone Encounter (Signed)
Patient has appt with Dr. Elease Hashimoto at 5:45pm.   [facility] Patient Name: Jesus [name] Gender: Male DOB: 04-17-1941 Age: 75 Y 70 M 8 D Return Phone Number: [phone] (Primary) Address: [name] [address] Client [facility] Night - Client Client Site [facility] - Night Physician [name] - MD Contact Type Call Who Is Calling Patient / Member / Family / Caregiver Call Type Triage / Clinical Caller Name Yosgart Mayall Relationship To Patient Spouse Return Phone Number [phone] (Primary) Chief Complaint Weakness, Generalized Reason for Call Symptomatic / Request for [place] states that her husband has diarrhea, weakness, no fever. PreDisposition Did not know what to do Translation No Nurse Assessment Nurse: Joya Gaskins, RN, Vonna Kotyk Date/Time Eilene Ghazi Time): 08/17/2016 4:24:05 PM Confirm and document reason for call. If symptomatic, describe symptoms. You must click the next button to save text entered. ---Caller states that her husband has diarrhea, weakness, no fever. He saw MD Wednesday and was told he had a virus (he had a lot of nasal drip, bad cough). Diarrhea began about 4 days ago. He has had one episode of diarrhea today, but he is much more weak than usual. Has the patient traveled out of the country within the last 30 days? ---No Does the patient have any new or worsening symptoms? ---Yes Will a triage be completed? ---Yes Related visit to physician within the last 2 weeks? ---No Does the PT have any chronic conditions? (i.e. diabetes, asthma, etc.) ---Yes List chronic conditions. ---heart problems (aortic vavle replaced, damage to heart, A-fib) Is this a behavioral health or substance abuse call? ---No Guidelines Guideline Title Affirmed Question Affirmed Notes Nurse Date/Time  (Eastern Time) Weakness (Generalized) and Fatigue [1] MODERATE weakness (i.e., interferes with work, school, normal activities) AND [2] persists > 3 days Rachel Moulds 08/17/2016 4:29:14 PM Disp. Time Eilene Ghazi Time) Disposition Final User PLEASE NOTE: All timestamps contained within this report are represented as [place] Standard Time. CONFIDENTIALTY NOTICE: This fax transmission is intended only for the addressee. It contains information that is legally privileged, confidential or otherwise protected from use or disclosure. If you are not the intended recipient, you are strictly prohibited from reviewing, disclosing, copying using or disseminating any of this information or taking any action in reliance on or regarding this information. If you have received this fax in error, please notify us immediately by telephone so that we can arrange for its return to [place]. Phone: [phone], Toll-Free: [phone], Fax: [phone] Page: 2 of 2 Call Id: [id number] 08/17/2016 4:35:59 PM See Physician within 24 Hours Yes Joya Gaskins, RN, Aviva Kluver Understands: Yes Disagree/Comply: Comply Care Advice Given Per Guideline SEE PHYSICIAN WITHIN 24 HOURS: * IF OFFICE WILL BE OPEN: You need to be seen within the next 24 hours. Call your doctor when the office opens, and make an appointment. CALL BACK IF: * You become worse. CARE ADVICE given per Weakness and Fatigue (Adult) guideline. Referrals REFERRED TO PCP OFFICE

## 2016-08-17 NOTE — Progress Notes (Signed)
Subjective:     Patient ID: Jesus [name], male   DOB: 04/18/1941, 75 y.o.   MRN: [id number]  HPI Patient seen with chief complaint of diarrhea which started Sunday. He was seen last week with presumed viral illness but he had no nausea or vomiting or diarrhea at that time. He started with diarrhea Sunday and apparently had several episodes possibly up to 8 episodes of liquid brown diarrhea then but over the past couple days has had only a couple episodes. He's had decreased appetite. Still has some mild nausea but no vomiting. Denies any abdominal pain. No bloody stools.  He is keeping down fluids well. No orthostatic dizziness  Past Medical History:  Diagnosis Date  . 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 Aortic stenosis    mild with a mean aortic valve gradient of 12 mmHg  . Atrial fibrillation (HCC)    holding sinus rhythm on Amiodarone  . CAD (coronary artery disease)    a. S/P Ant MI 1980;  b. 1997 S/P CABG x 8 (LIMA to diag-LAD, SVG to OM1-OM2-OM3, SVG to [facility] - Dr [name]);  c. 01/2015 Cath: 3VD, 8/8 patent grafts.  . Cardiomyopathy   . Dysrhythmia   . Esophageal reflux   . GERD (gastroesophageal reflux disease)   . Headache   . History of colonoscopy   . History of transesophageal echocardiography (TEE) for monitoring   . Other and unspecified hyperlipidemia   . S/P TAVR (transcatheter aortic valve replacement)    a. 03/2015 26 mm Edwards Sapien 3 transcatheter heart valve placed via open left transfemoral approach.  . Skin lesions, generalized    facial which may represent actinic keratoses and possible photosensitivity from Amiodarone  . Unspecified essential hypertension    Past Surgical History:  Procedure Laterality Date  . CARDIAC CATHETERIZATION    . CARDIOVERSION  11/18/2006   Dr. [name]  . CATARACT EXTRACTION W/ INTRAOCULAR LENS IMPLANT  April '13  (Dr. [name])   left eye only  . CHOLECYSTECTOMY    . CORONARY ARTERY BYPASS  GRAFT  02/09/1996   LIMA to diag-LAD, SVG to OM1-OM2-OM3, SVG to [facility]  . EYE SURGERY    . LEFT AND RIGHT HEART CATHETERIZATION WITH CORONARY/GRAFT ANGIOGRAM N/A 01/26/2015   Procedure: LEFT AND RIGHT HEART CATHETERIZATION WITH [name];  Surgeon: [name], MD;  Location: [facility] CATH LAB;  Service: Cardiovascular;  Laterality: N/A;  . TEE WITHOUT CARDIOVERSION N/A 03/10/2015   Procedure: TRANSESOPHAGEAL ECHOCARDIOGRAM (TEE);  Surgeon: [name], MD;  Location: [place];  Service: Open Heart Surgery;  Laterality: N/A;  . TONSILLECTOMY    . TRANSCATHETER AORTIC VALVE REPLACEMENT, TRANSFEMORAL N/A 03/10/2015   Procedure: TRANSCATHETER AORTIC VALVE REPLACEMENT, TRANSFEMORAL;  Surgeon: [name], MD;  Location: [place];  Service: Open Heart Surgery;  Laterality: N/A;    reports that he has never smoked. He has never used smokeless tobacco. He reports that he does not drink alcohol or use drugs. family history includes Atrial fibrillation in his brother; Crohn's disease in his mother; Heart attack (age of onset: 63) in his father; Heart disease in his father and paternal uncle; Heart failure in his father; Prostate cancer in his paternal uncle; Stroke (age of onset: 52) in his mother. Allergies  Allergen Reactions  . Codeine Other (See Comments)    Pt does not remember  . Iodine Swelling     Review of Systems  Constitutional: Negative for chills and fever.  Respiratory: Negative for shortness  of breath.   Gastrointestinal: Positive for diarrhea and nausea. Negative for abdominal pain, blood in stool and vomiting.  Neurological: Negative for dizziness.       Objective:   Physical Exam  Constitutional: He appears well-developed and well-nourished.  HENT:  Mouth/Throat: Oropharynx is clear and moist.  Neck: Neck supple.  Cardiovascular: Normal rate and regular rhythm.   Pulmonary/Chest: Effort normal and breath sounds normal. No respiratory distress. He has no  wheezes. He has no rales.  Abdominal: Soft. Bowel sounds are normal. He exhibits no distension and no mass. There is no tenderness. There is no rebound and no guarding.       Assessment:     Diarrhea. Patient looks amazingly good considering that his weight is down 6 pounds from last week. He has moist oropharynx and does not have any orthostatic changes with standing blood pressure 122/60 so doubt severe dehydration.    Plan:     -Handout on appropriate diet given-for diarrhea. -Increase fluid intake and consider supplement with Gatorade -Touch base in 2 days if symptoms not further improving -May consider OTC Imodium if no fever, bloody stools, etc.  [name] MD [facility] at [facility]

## 2016-08-19 DIAGNOSIS — S50812A Abrasion of left forearm, initial encounter: Secondary | ICD-10-CM | POA: Diagnosis not present

## 2016-08-25 ENCOUNTER — Ambulatory Visit (INDEPENDENT_AMBULATORY_CARE_PROVIDER_SITE_OTHER): Payer: Medicare Other | Admitting: General Practice

## 2016-08-25 DIAGNOSIS — I482 Chronic atrial fibrillation, unspecified: Secondary | ICD-10-CM

## 2016-08-25 DIAGNOSIS — Z954 Presence of other heart-valve replacement: Secondary | ICD-10-CM | POA: Diagnosis not present

## 2016-08-25 DIAGNOSIS — Z5181 Encounter for therapeutic drug level monitoring: Secondary | ICD-10-CM

## 2016-08-25 DIAGNOSIS — Z952 Presence of prosthetic heart valve: Secondary | ICD-10-CM

## 2016-08-25 LAB — POCT INR: INR: 4.4

## 2016-08-25 NOTE — Patient Instructions (Signed)
Pre visit review using our clinic review tool, if applicable. No additional management support is needed unless otherwise documented below in the visit note. INR is high today.  Patient denies extra doses of coumadin. Patient will hold coumadin for 2 days and resume current dosage on Sunday.  I will re-check patient in 7 to 10 days.  Reiterated the risks of a supra-therapeutic INR and encouraged patient to report any unusual bleeding.  Patient is using a pill box for medications.  Patient verbalized understanding.

## 2016-08-25 NOTE — Progress Notes (Signed)
I have reviewed and agree with the plan. 

## 2016-09-02 ENCOUNTER — Ambulatory Visit (INDEPENDENT_AMBULATORY_CARE_PROVIDER_SITE_OTHER): Payer: Medicare Other | Admitting: General Practice

## 2016-09-02 DIAGNOSIS — Z5181 Encounter for therapeutic drug level monitoring: Secondary | ICD-10-CM

## 2016-09-02 DIAGNOSIS — Z23 Encounter for immunization: Secondary | ICD-10-CM | POA: Diagnosis not present

## 2016-09-02 DIAGNOSIS — Z952 Presence of prosthetic heart valve: Secondary | ICD-10-CM

## 2016-09-02 DIAGNOSIS — I482 Chronic atrial fibrillation, unspecified: Secondary | ICD-10-CM

## 2016-09-02 LAB — POCT INR: INR: 1.9

## 2016-09-03 ENCOUNTER — Other Ambulatory Visit: Payer: Self-pay | Admitting: Family Medicine

## 2016-09-07 ENCOUNTER — Ambulatory Visit: Payer: Medicare Other

## 2016-09-12 ENCOUNTER — Ambulatory Visit (INDEPENDENT_AMBULATORY_CARE_PROVIDER_SITE_OTHER): Payer: Medicare Other | Admitting: Cardiovascular Disease

## 2016-09-12 ENCOUNTER — Encounter: Payer: Self-pay | Admitting: Cardiovascular Disease

## 2016-09-12 ENCOUNTER — Other Ambulatory Visit: Payer: Self-pay | Admitting: Family Medicine

## 2016-09-12 VITALS — BP 140/56 | HR 52 | Ht 62.0 in | Wt 132.0 lb

## 2016-09-12 DIAGNOSIS — I482 Chronic atrial fibrillation, unspecified: Secondary | ICD-10-CM

## 2016-09-12 DIAGNOSIS — I359 Nonrheumatic aortic valve disorder, unspecified: Secondary | ICD-10-CM

## 2016-09-12 DIAGNOSIS — Z952 Presence of prosthetic heart valve: Secondary | ICD-10-CM

## 2016-09-12 DIAGNOSIS — Z5181 Encounter for therapeutic drug level monitoring: Secondary | ICD-10-CM | POA: Diagnosis not present

## 2016-09-12 DIAGNOSIS — Z79899 Other long term (current) drug therapy: Secondary | ICD-10-CM | POA: Diagnosis not present

## 2016-09-12 LAB — COMPREHENSIVE METABOLIC PANEL
ALBUMIN: 3.9 g/dL (ref 3.6–5.1)
ALT: 17 U/L (ref 9–46)
AST: 21 U/L (ref 10–35)
Alkaline Phosphatase: 89 U/L (ref 40–115)
BUN: 16 mg/dL (ref 7–25)
CALCIUM: 8.8 mg/dL (ref 8.6–10.3)
CHLORIDE: 105 mmol/L (ref 98–110)
CO2: 25 mmol/L (ref 20–31)
Creat: 0.91 mg/dL (ref 0.70–1.18)
GLUCOSE: 82 mg/dL (ref 65–99)
POTASSIUM: 3.9 mmol/L (ref 3.5–5.3)
Sodium: 139 mmol/L (ref 135–146)
Total Bilirubin: 0.9 mg/dL (ref 0.2–1.2)
Total Protein: 6.3 g/dL (ref 6.1–8.1)

## 2016-09-12 LAB — LIPID PANEL
CHOL/HDL RATIO: 3 ratio (ref <=5.0)
CHOLESTEROL: 134 mg/dL (ref 125–200)
HDL: 45 mg/dL (ref >=40)
LDL CALC: 65 mg/dL (ref <130)
TRIGLYCERIDES: 119 mg/dL (ref <150)
VLDL: 24 mg/dL (ref <30)

## 2016-09-12 LAB — TSH: TSH: 2.28 mIU/L (ref 0.40–4.50)

## 2016-09-12 NOTE — Progress Notes (Signed)
Cardiology Office Note Date:  09/12/2016   ID:  Jesus [name], DOB January 13, 1941, MRN [id number]  PCP:  [name], MD  Cardiologist:  [name], MD    Chief Complaint  Patient presents with  . Aortic Stenosis     History of Present Illness: Jesus [name] is a 75 y.o. male who presents for follow-up of aortic valve disease, coronary disease, paroxysmal atrial fibrillation, and hypertension.  The patient had remote inferior MI in 1980. He underwent CABG in 1997. He developed progressive symptoms of severe aortic stenosis and underwent TAVR in April 2016 via a transfemoral approach. He was treated with a 26 mm Sapien 3 transcatheter heart valve. Postop echo has shown normal transvalvular velocities and mild aortic insufficiency.  The patient is here with his wife today. He is doing well with no symptoms of shortness of breath, chest pain, lightheadedness, or heart palpitations. He continues to do physical work without exertional symptoms. He does stop and rest sometimes maybe with more fatigue than he has had in the past. No specific complaints and he is quite happy with how he's doing.  Past Medical History:  Diagnosis Date  . 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 Aortic stenosis    mild with a mean aortic valve gradient of 12 mmHg  . Atrial fibrillation (HCC)    holding sinus rhythm on Amiodarone  . CAD (coronary artery disease)    a. S/P Ant MI 1980;  b. 1997 S/P CABG x 8 (LIMA to diag-LAD, SVG to OM1-OM2-OM3, SVG to [facility] - Dr [name]);  c. 01/2015 Cath: 3VD, 8/8 patent grafts.  . Cardiomyopathy   . Dysrhythmia   . Esophageal reflux   . GERD (gastroesophageal reflux disease)   . Headache   . History of colonoscopy   . History of transesophageal echocardiography (TEE) for monitoring   . Other and unspecified hyperlipidemia   . S/P TAVR (transcatheter aortic valve replacement)    a. 03/2015 26 mm Edwards Sapien 3 transcatheter heart  valve placed via open left transfemoral approach.  . Skin lesions, generalized    facial which may represent actinic keratoses and possible photosensitivity from Amiodarone  . Unspecified essential hypertension     Past Surgical History:  Procedure Laterality Date  . CARDIAC CATHETERIZATION    . CARDIOVERSION  11/18/2006   Dr. [name]  . CATARACT EXTRACTION W/ INTRAOCULAR LENS IMPLANT  April '13  (Dr. [name])   left eye only  . CHOLECYSTECTOMY    . CORONARY ARTERY BYPASS GRAFT  02/09/1996   LIMA to diag-LAD, SVG to OM1-OM2-OM3, SVG to [facility]  . EYE SURGERY    . LEFT AND RIGHT HEART CATHETERIZATION WITH CORONARY/GRAFT ANGIOGRAM N/A 01/26/2015   Procedure: LEFT AND RIGHT HEART CATHETERIZATION WITH [name];  Surgeon: [name], MD;  Location: [facility] CATH LAB;  Service: Cardiovascular;  Laterality: N/A;  . TEE WITHOUT CARDIOVERSION N/A 03/10/2015   Procedure: TRANSESOPHAGEAL ECHOCARDIOGRAM (TEE);  Surgeon: [name], MD;  Location: [place];  Service: Open Heart Surgery;  Laterality: N/A;  . TONSILLECTOMY    . TRANSCATHETER AORTIC VALVE REPLACEMENT, TRANSFEMORAL N/A 03/10/2015   Procedure: TRANSCATHETER AORTIC VALVE REPLACEMENT, TRANSFEMORAL;  Surgeon: [name], MD;  Location: [place];  Service: Open Heart Surgery;  Laterality: N/A;    Current Outpatient Prescriptions  Medication Sig Dispense Refill  . acetaminophen (TYLENOL) 500 MG tablet Take 500 mg by mouth every 6 (six) hours as needed for moderate pain.    [name]  amiodarone (PACERONE) 200 MG tablet Take 0.5 tablets (100 mg total) by mouth daily. 30 tablet 3  . amLODipine (NORVASC) 5 MG tablet Take 1 tablet (5 mg total) by mouth daily. 90 tablet 1  . amoxicillin (AMOXIL) 400 MG/5ML suspension Take 5 tsp po one hour prior to dental procedure 25 mL 1  . Ascorbic Acid (VITAMIN C) 1000 MG tablet Take 1,000 mg by mouth daily.      [name] aspirin 81 MG tablet Take 81 mg by mouth daily.      [name] atorvastatin (LIPITOR) 80  MG tablet TAKE 1/2 TABLET BY MOUTH DAILY 45 tablet 3  . loratadine (CLARITIN) 10 MG tablet Take 10 mg by mouth daily as needed for allergies.     [name] losartan (COZAAR) 100 MG tablet TAKE 1 TABLET BY MOUTH ONCE DAILY. 30 tablet 5  . Multiple Vitamins-Minerals (MULTIVITAMIN,TX-MINERALS) tablet Take 1 tablet by mouth daily.      . pantoprazole (PROTONIX) 40 MG tablet TAKE 1 TABLET BY MOUTH ONCE DAILY 90 tablet 1  . traMADol (ULTRAM) 50 MG tablet Take 1-2 tablets (50-100 mg total) by mouth every 4 (four) hours as needed for moderate pain. 30 tablet 0  . warfarin (COUMADIN) 5 MG tablet TAKE AS DIRECTED BY COUMADIN CLINIC. 30 tablet 2   No current facility-administered medications for this visit.     Allergies:   Codeine and Iodine   Social History:  The patient  reports that he has never smoked. He has never used smokeless tobacco. He reports that he does not drink alcohol or use drugs.   Family History:  The patient's family history includes Atrial fibrillation in his brother; Crohn's disease in his mother; Heart attack (age of onset: 79) in his father; Heart disease in his father and paternal uncle; Heart failure in his father; Prostate cancer in his paternal uncle; Stroke (age of onset: 27) in his mother.    ROS:  Please see the history of present illness.  All other systems are reviewed and negative.    PHYSICAL EXAM: VS:  BP (!) 140/56   Pulse (!) 52   Ht 5\' 2"  (1.575 m)   Wt 132 lb (59.9 kg)   BMI 24.14 kg/m  , BMI Body mass index is 24.14 kg/m. GEN: Well nourished, well developed, in no acute distress  HEENT: normal  Neck: no JVD, no masses. bilateral carotid bruits Cardiac: bradycardic and regular with 2/6 systolic murmur and [id number] diastolic decrescendo murmur at the RUSB                Respiratory:  clear to auscultation bilaterally, normal work of breathing GI: soft, nontender, nondistended, + BS MS: no deformity or atrophy  Ext: no pretibial edema, pedal pulses 2+=  bilaterally Skin: warm and dry, no rash Neuro:  Strength and sensation are intact Psych: euthymic mood, full affect  EKG:  EKG is ordered today. The ekg ordered today shows sinus bradycardia 52 bpm, age indeterminate inferior infarct, voltage criteria for LVH.  Recent Labs: 03/11/2016: ALT 18; BUN 15; Creat 0.88; Potassium 4.2; Sodium 141; TSH 3.41   Lipid Panel     Component Value Date/Time   CHOL 154 03/11/2016 0734   TRIG 84 03/11/2016 0734   TRIG 67 11/20/2006 0748   HDL 55 03/11/2016 0734   CHOLHDL 2.8 03/11/2016 0734   VLDL 17 03/11/2016 0734   LDLCALC 82 03/11/2016 0734      Wt Readings from Last 3 Encounters:  09/12/16 132 lb (59.9  kg)  08/17/16 129 lb 1.6 oz (58.6 kg)  08/10/16 135 lb (61.2 kg)     Cardiac Studies Reviewed: Echo 03-11-2016: Study Conclusions  - Left ventricle: The cavity size was mildly dilated. Wall   thickness was increased in a pattern of mild LVH. Systolic   function was moderately reduced. The estimated ejection fraction   was in the range of 35% to 40%. Akinesis of the basal-midinferior   myocardium. Doppler parameters are consistent with abnormal left   ventricular relaxation (grade 1 diastolic dysfunction). - Aortic valve: AV prosthesis appears to open well. Peak and mean   gradients through the valve are 31 and 14 mm Hg respectively.   There was mild regurgitation. - Left atrium: The atrium was severely dilated.  ASSESSMENT AND PLAN: 1.  Aortic valve disease s/p TAVR: NYHA I sx's. His exam is stable and his postoperative echo has been reviewed. He will have another echocardiogram in 6 months to reassess degree of aortic insufficiency. By exam this continues to sound in the mild range. He understands the need for SBE prophylaxis.   2. Coronary artery disease, native vessel: No symptoms of angina. Continue medical therapy.  3. Paroxysmal atrial fibrillation: Maintaining sinus rhythm on amiodarone. Tolerating anticoagulation with  warfarin. Will update labs to include thyroid and liver function studies since he is treated with a rhythm control strategy on amiodarone.  4.  Chronic systolic heart failure, NYHA functional class I: the patient will continue his current medical program. Medications are reviewed today and no changes made.  5. Essential hypertension: continue losartan and amlodipine  6. Hyperlipidemia: Patient takes atorvastatin 40 mg daily. Will update lipid panel today as he is fasting.  Current medicines are reviewed with the patient today.  The patient does not have concerns regarding medicines.  Labs/ tests ordered today include:  No orders of the defined types were placed in this encounter.  Disposition:   FU 6 months with an echocardiogram prior to that visit. Labs are drawn today.  Deatra James, MD  09/12/2016 12:29 PM    [address], [place], [place]  [address] Phone: [phone]; Fax: [phone]

## 2016-09-12 NOTE — Patient Instructions (Signed)
Medication Instructions:  Your physician recommends that you continue on your current medications as directed. Please refer to the Current Medication list given to you today.  Labwork: Your physician recommends that you have lab work today: CMP, LIPID and TSH   Testing/Procedures: Your physician has requested that you have an echocardiogram in 6 MONTHS. Echocardiography is a painless test that uses sound waves to create images of your heart. It provides your doctor with information about the size and shape of your heart and how well your heart's chambers and valves are working. This procedure takes approximately one hour. There are no restrictions for this procedure.  Follow-Up: Your physician wants you to follow-up in: 6 MONTHS with [name].  You will receive a reminder letter in the mail two months in advance. If you don't receive a letter, please call our office to schedule the follow-up appointment.   Any Other Special Instructions Will Be Listed Below (If Applicable).     If you need a refill on your cardiac medications before your next appointment, please call your pharmacy.

## 2016-09-13 ENCOUNTER — Other Ambulatory Visit: Payer: Self-pay | Admitting: General Practice

## 2016-09-13 MED ORDER — WARFARIN SODIUM 5 MG PO TABS: ORAL_TABLET | ORAL | 1 refills | Status: DC

## 2016-09-13 NOTE — Telephone Encounter (Signed)
Refill for 3 months. 

## 2016-09-19 ENCOUNTER — Encounter: Payer: Self-pay | Admitting: Family Medicine

## 2016-09-19 ENCOUNTER — Ambulatory Visit (INDEPENDENT_AMBULATORY_CARE_PROVIDER_SITE_OTHER): Payer: Medicare Other | Admitting: Family Medicine

## 2016-09-19 VITALS — BP 120/60 | HR 60 | Temp 98.0°F | Ht 62.0 in | Wt 129.8 lb

## 2016-09-19 DIAGNOSIS — I10 Essential (primary) hypertension: Secondary | ICD-10-CM | POA: Diagnosis not present

## 2016-09-19 DIAGNOSIS — E785 Hyperlipidemia, unspecified: Secondary | ICD-10-CM | POA: Diagnosis not present

## 2016-09-19 DIAGNOSIS — Z952 Presence of prosthetic heart valve: Secondary | ICD-10-CM | POA: Diagnosis not present

## 2016-09-19 DIAGNOSIS — Z0000 Encounter for general adult medical examination without abnormal findings: Secondary | ICD-10-CM

## 2016-09-19 DIAGNOSIS — K219 Gastro-esophageal reflux disease without esophagitis: Secondary | ICD-10-CM | POA: Diagnosis not present

## 2016-09-19 NOTE — Progress Notes (Signed)
Pre visit review using our clinic review tool, if applicable. No additional management support is needed unless otherwise documented below in the visit note. 

## 2016-09-19 NOTE — Progress Notes (Signed)
Subjective:     Patient ID: Jesus [name], male   DOB: 15-Dec-1940, 75 y.o.   MRN: [id number]  HPI Patient seen for medical follow-up and Medicare subsequent annual wellness visit. [name] His chronic problems include history of aortic valve replacement, atrial fibrillation, hyperlipidemia, hypertension, GERD, chronic anticoagulation  Had recent labs through cardiology and these were reviewed and stable. He has been on Protonix apparently for several years for GERD symptoms but has never tried transitioning off this. No history of Barrett's esophagus or esophageal stricture. No history of peptic ulcer disease. No recent active GERD symptoms.  Medications reviewed and compliant with all. Blood pressure stable. No dizziness. No chest pains. No dyspnea. Does some walking for exercise.  On coumadin and no recent bleeding concerns.  1.  Risk factors based on Past Medical , Social, and Family history reviewed and as indicated above with no changes 2.  Limitations in physical activities None.  No recent falls. No consistent exercise.   3.  Depression/mood No active depression or anxiety issues. 4.  Hearing chronic bilateral loss.  Has hearing aids and gets checked regularly. 5.  ADLs independent in all. 6.  Cognitive function (orientation to time and place, language, writing, speech,memory) .  Language and judgement intact.  Wife has some concern regarding short term memory .  Occasionally repeats himself but no major concerns. 7.  Home Safety no issues 8.  Height, weight, and visual acuity.all stable. 9.  Counseling discussed Advanced Directives.  Does not have Living Will. 10. Recommendation of preventive services.  Vaccines all up to date. 11. Labs based on risk factors none indicated.   Recent labs per cardiology reviewed with patient. 12. Care Plan as above.  13. Other Providers- [name] Cardiology. 14. Written schedule of screening/prevention services given to patient.   Past Medical History:   Diagnosis Date  . 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 Aortic stenosis    mild with a mean aortic valve gradient of 12 mmHg  . Atrial fibrillation (HCC)    holding sinus rhythm on Amiodarone  . CAD (coronary artery disease)    a. S/P Ant MI 1980;  b. 1997 S/P CABG x 8 (LIMA to diag-LAD, SVG to OM1-OM2-OM3, SVG to [facility] - Dr [name]);  c. 01/2015 Cath: 3VD, 8/8 patent grafts.  . Cardiomyopathy   . Dysrhythmia   . Esophageal reflux   . GERD (gastroesophageal reflux disease)   . Headache   . History of colonoscopy   . History of transesophageal echocardiography (TEE) for monitoring   . Other and unspecified hyperlipidemia   . S/P TAVR (transcatheter aortic valve replacement)    a. 03/2015 26 mm Edwards Sapien 3 transcatheter heart valve placed via open left transfemoral approach.  . Skin lesions, generalized    facial which may represent actinic keratoses and possible photosensitivity from Amiodarone  . Unspecified essential hypertension    Past Surgical History:  Procedure Laterality Date  . CARDIAC CATHETERIZATION    . CARDIOVERSION  11/18/2006   Dr. [name]  . CATARACT EXTRACTION W/ INTRAOCULAR LENS IMPLANT  April '13  (Dr. [name])   left eye only  . CHOLECYSTECTOMY    . CORONARY ARTERY BYPASS GRAFT  02/09/1996   LIMA to diag-LAD, SVG to OM1-OM2-OM3, SVG to [facility]  . EYE SURGERY    . LEFT AND RIGHT HEART CATHETERIZATION WITH CORONARY/GRAFT ANGIOGRAM N/A 01/26/2015   Procedure: LEFT AND RIGHT HEART CATHETERIZATION WITH [name];  Surgeon: [name], MD;  Location: [place] CATH LAB;  Service: Cardiovascular;  Laterality: N/A;  . TEE WITHOUT CARDIOVERSION N/A 03/10/2015   Procedure: TRANSESOPHAGEAL ECHOCARDIOGRAM (TEE);  Surgeon: [name], MD;  Location: [place];  Service: Open Heart Surgery;  Laterality: N/A;  . TONSILLECTOMY    . TRANSCATHETER AORTIC VALVE REPLACEMENT, TRANSFEMORAL N/A 03/10/2015   Procedure: TRANSCATHETER  AORTIC VALVE REPLACEMENT, TRANSFEMORAL;  Surgeon: [name], MD;  Location: [place];  Service: Open Heart Surgery;  Laterality: N/A;    reports that he has never smoked. He has never used smokeless tobacco. He reports that he does not drink alcohol or use drugs. family history includes Atrial fibrillation in his brother; Crohn's disease in his mother; Heart attack (age of onset: 21) in his father; Heart disease in his father and paternal uncle; Heart failure in his father; Prostate cancer in his paternal uncle; Stroke (age of onset: 55) in his mother. Allergies  Allergen Reactions  . Codeine Other (See Comments)    Pt does not remember  . Iodine Swelling     Review of Systems  Constitutional: Negative for chills, fatigue, fever and unexpected weight change.  Eyes: Negative for visual disturbance.  Respiratory: Negative for cough, chest tightness and shortness of breath.   Cardiovascular: Negative for chest pain, palpitations and leg swelling.  Endocrine: Negative for polydipsia and polyuria.  Genitourinary: Negative for dysuria.  Neurological: Negative for dizziness, syncope, weakness, light-headedness and headaches.  Hematological: Negative for adenopathy.  Psychiatric/Behavioral: Negative for dysphoric mood.       Objective:   Physical Exam  Constitutional: He is oriented to person, place, and time. He appears well-developed and well-nourished.  HENT:  Right Ear: External ear normal.  Left Ear: External ear normal.  Mouth/Throat: Oropharynx is clear and moist.  Eyes: Pupils are equal, round, and reactive to light.  Neck: Neck supple. No thyromegaly present.  Cardiovascular: Normal rate.   Pulmonary/Chest: Effort normal and breath sounds normal. No respiratory distress. He has no wheezes. He has no rales.  Musculoskeletal: He exhibits no edema.  Neurological: He is alert and oriented to person, place, and time.  Psychiatric: He has a normal mood and affect. His behavior is  normal.       Assessment:      #1 Medicare subsequent annual wellness visit.  #2 history of GERD stable  #3 hypertension stable and at goal  #4 history of aortic valve stenosis  #5 history of atrial fibrillation  # 6 dyslipidemia    Plan:     -We've recommend he try transitioning slowly off Protonix. Consider every other day for the next few weeks and consider Pepcid or Zantac on off days and then try transitioning over to Pepcid or Zantac fully. -Continue current medications. -Recent labs reviewed with patient -Routine follow-up in 6 months -immunizations all up to date.  [name] MD [facility] at [facility]

## 2016-09-19 NOTE — Patient Instructions (Signed)
Try to reduce the Protonix to every other day and try Pepcid or Zantac on the odd days After about 3 weeks try stopping the Protonix and daily Pepcid or Zantac,

## 2016-09-30 ENCOUNTER — Ambulatory Visit (INDEPENDENT_AMBULATORY_CARE_PROVIDER_SITE_OTHER): Payer: Medicare Other | Admitting: General Practice

## 2016-09-30 DIAGNOSIS — I482 Chronic atrial fibrillation, unspecified: Secondary | ICD-10-CM

## 2016-09-30 DIAGNOSIS — Z5181 Encounter for therapeutic drug level monitoring: Secondary | ICD-10-CM

## 2016-09-30 DIAGNOSIS — Z952 Presence of prosthetic heart valve: Secondary | ICD-10-CM

## 2016-09-30 LAB — POCT INR: INR: 1.9

## 2016-09-30 NOTE — Patient Instructions (Signed)
Pre visit review using our clinic review tool, if applicable. No additional management support is needed unless otherwise documented below in the visit note. 

## 2016-09-30 NOTE — Progress Notes (Signed)
I have reviewed and agree with the plan. 

## 2016-10-06 ENCOUNTER — Other Ambulatory Visit: Payer: Self-pay

## 2016-10-06 MED ORDER — AMIODARONE HCL 200 MG PO TABS
100.0000 mg | ORAL_TABLET | Freq: Every day | ORAL | 5 refills | Status: DC
Start: 2016-10-06 — End: 2017-03-27

## 2016-10-25 ENCOUNTER — Ambulatory Visit (INDEPENDENT_AMBULATORY_CARE_PROVIDER_SITE_OTHER): Payer: Medicare Other | Admitting: Family Medicine

## 2016-10-25 VITALS — BP 144/80 | HR 73 | Temp 99.0°F | Ht 62.0 in | Wt 130.5 lb

## 2016-10-25 DIAGNOSIS — J069 Acute upper respiratory infection, unspecified: Secondary | ICD-10-CM | POA: Diagnosis not present

## 2016-10-25 MED ORDER — DOXYCYCLINE HYCLATE 100 MG PO CAPS: 100.0000 mg | ORAL_CAPSULE | Freq: Two times a day (BID) | ORAL | 0 refills | Status: DC

## 2016-10-25 NOTE — Progress Notes (Signed)
Pre visit review using our clinic review tool, if applicable. No additional management support is needed unless otherwise documented below in the visit note. 

## 2016-10-25 NOTE — Progress Notes (Signed)
Subjective:     Patient ID: Jesus [name], male   DOB: 01/26/1941, 75 y.o.   MRN: [id number]  HPI Acute visit for upper respiratory illness. Onset about 4 days ago. His wife is apparently battling a sinus infection currently. He has increased malaise. Body aches. No definite fever. No headaches. No sore throat. He's had some productive cough and some colored nasal discharge. He states he had difficulty fighting infections in the past He does take Coumadin. No recent bleeding concerns  Past Medical History:  Diagnosis Date  . Acute myocardial infarction of inferior wall ([place]) 1980  . Aortic stenosis    mild with a mean aortic valve gradient of 12 mmHg  . Atrial fibrillation (HCC)    holding sinus rhythm on Amiodarone  . CAD (coronary artery disease)    a. S/P Ant MI 1980;  b. 1997 S/P CABG x 8 (LIMA to diag-LAD, SVG to OM1-OM2-OM3, SVG to [facility] - Dr [name]);  c. 01/2015 Cath: 3VD, 8/8 patent grafts.  . Cardiomyopathy   . Dysrhythmia   . Esophageal reflux   . GERD (gastroesophageal reflux disease)   . Headache   . History of colonoscopy   . History of transesophageal echocardiography (TEE) for monitoring   . Other and unspecified hyperlipidemia   . S/P TAVR (transcatheter aortic valve replacement)    a. 03/2015 26 mm Edwards Sapien 3 transcatheter heart valve placed via open left transfemoral approach.  . Skin lesions, generalized    facial which may represent actinic keratoses and possible photosensitivity from Amiodarone  . Unspecified essential hypertension    Past Surgical History:  Procedure Laterality Date  . CARDIAC CATHETERIZATION    . CARDIOVERSION  11/18/2006   Dr. [name]  . CATARACT EXTRACTION W/ INTRAOCULAR LENS IMPLANT  April '13  (Dr. [name])   left eye only  . CHOLECYSTECTOMY    . CORONARY ARTERY BYPASS GRAFT  02/09/1996   LIMA to diag-LAD, SVG to OM1-OM2-OM3, SVG to [facility]  . EYE SURGERY    . LEFT AND RIGHT HEART CATHETERIZATION WITH  CORONARY/GRAFT ANGIOGRAM N/A 01/26/2015   Procedure: LEFT AND RIGHT HEART CATHETERIZATION WITH [name];  Surgeon: [name], MD;  Location: [facility] CATH LAB;  Service: Cardiovascular;  Laterality: N/A;  . TEE WITHOUT CARDIOVERSION N/A 03/10/2015   Procedure: TRANSESOPHAGEAL ECHOCARDIOGRAM (TEE);  Surgeon: [name], MD;  Location: [place];  Service: Open Heart Surgery;  Laterality: N/A;  . TONSILLECTOMY    . TRANSCATHETER AORTIC VALVE REPLACEMENT, TRANSFEMORAL N/A 03/10/2015   Procedure: TRANSCATHETER AORTIC VALVE REPLACEMENT, TRANSFEMORAL;  Surgeon: [name], MD;  Location: [place];  Service: Open Heart Surgery;  Laterality: N/A;    reports that he has never smoked. He has never used smokeless tobacco. He reports that he does not drink alcohol or use drugs. family history includes Atrial fibrillation in his brother; Crohn's disease in his mother; Heart attack (age of onset: 25) in his father; Heart disease in his father and paternal uncle; Heart failure in his father; Prostate cancer in his paternal uncle; Stroke (age of onset: 71) in his mother. Allergies  Allergen Reactions  . Codeine Other (See Comments)    Pt does not remember  . Iodine Swelling     Review of Systems  Constitutional: Positive for fatigue.  HENT: Positive for congestion. Negative for sore throat.   Respiratory: Positive for cough. Negative for wheezing.   Cardiovascular: Negative for chest pain and leg swelling.  Neurological: Negative for headaches.  Objective:   Physical Exam  Constitutional: He appears well-developed and well-nourished. No distress.  HENT:  Right Ear: External ear normal.  Left Ear: External ear normal.  Mouth/Throat: Oropharynx is clear and moist.  Minimal cerumen right canal. Nasal mucosa erythematous with some thick yellow mucus left naris  Cardiovascular: Normal rate and regular rhythm.   Pulmonary/Chest: Effort normal and breath sounds normal. No respiratory  distress. He has no wheezes. He has no rales.       Assessment:     URI with possible acute sinusitis    Plan:     -Stay well-hydrated -Consider doxycycline and 100 mg twice a day for 10 days if he has any persistent or worsening symptoms  [name] MD [facility] at [facility]

## 2016-10-25 NOTE — Patient Instructions (Signed)
Follow up for any fever or increased shortness of breath. 

## 2016-11-01 ENCOUNTER — Other Ambulatory Visit: Payer: Self-pay | Admitting: Family Medicine

## 2016-11-04 ENCOUNTER — Ambulatory Visit (INDEPENDENT_AMBULATORY_CARE_PROVIDER_SITE_OTHER): Payer: Medicare Other | Admitting: General Practice

## 2016-11-04 DIAGNOSIS — Z952 Presence of prosthetic heart valve: Secondary | ICD-10-CM

## 2016-11-04 DIAGNOSIS — Z5181 Encounter for therapeutic drug level monitoring: Secondary | ICD-10-CM

## 2016-11-04 DIAGNOSIS — I482 Chronic atrial fibrillation, unspecified: Secondary | ICD-10-CM

## 2016-11-04 LAB — POCT INR: INR: 2.3

## 2016-11-04 NOTE — Progress Notes (Signed)
I have reviewed and agree with the plan. 

## 2016-11-04 NOTE — Patient Instructions (Signed)
Pre visit review using our clinic review tool, if applicable. No additional management support is needed unless otherwise documented below in the visit note. 

## 2016-11-15 ENCOUNTER — Encounter: Payer: Self-pay | Admitting: Family Medicine

## 2016-11-15 ENCOUNTER — Ambulatory Visit (INDEPENDENT_AMBULATORY_CARE_PROVIDER_SITE_OTHER): Payer: Medicare Other | Admitting: Family Medicine

## 2016-11-15 ENCOUNTER — Ambulatory Visit (INDEPENDENT_AMBULATORY_CARE_PROVIDER_SITE_OTHER)
Admission: RE | Admit: 2016-11-15 | Discharge: 2016-11-15 | Disposition: A | Discharge status: Home / Self Care | Payer: Medicare Other | Source: Ambulatory Visit | Attending: Family Medicine | Admitting: Family Medicine

## 2016-11-15 VITALS — BP 120/60 | HR 59 | Temp 97.7°F | Ht 62.0 in | Wt 129.0 lb

## 2016-11-15 DIAGNOSIS — R05 Cough: Secondary | ICD-10-CM

## 2016-11-15 DIAGNOSIS — R053 Chronic cough: Secondary | ICD-10-CM

## 2016-11-15 NOTE — Patient Instructions (Signed)
Go for CXR at [facility] Follow up for any fever or increased shortness of breath.

## 2016-11-15 NOTE — Progress Notes (Signed)
Subjective:     Patient ID: Jesus [name], male   DOB: 26-Apr-1941, 75 y.o.   MRN: [id number]  HPI Patient seen with persistent cough about 3 weeks duration. Refer to recent note. Onset around the middle of November. Patient is nonsmoker. Cough has been mostly dry. Initially had some nasal congestion and sinusitis symptoms. He took full ten-day course of doxycycline. He's been taking over-the-counter Robitussin with some improvement cough. Cough is slightly worse at night. Reported allergy to codeine. No wheezing. No GERD. No dyspnea.  Past Medical History:  Diagnosis Date  . 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 Aortic stenosis    mild with a mean aortic valve gradient of 12 mmHg  . Atrial fibrillation (HCC)    holding sinus rhythm on Amiodarone  . CAD (coronary artery disease)    a. S/P Ant MI 1980;  b. 1997 S/P CABG x 8 (LIMA to diag-LAD, SVG to OM1-OM2-OM3, SVG to [facility] - Dr [name]);  c. 01/2015 Cath: 3VD, 8/8 patent grafts.  . Cardiomyopathy   . Dysrhythmia   . Esophageal reflux   . GERD (gastroesophageal reflux disease)   . Headache   . History of colonoscopy   . History of transesophageal echocardiography (TEE) for monitoring   . Other and unspecified hyperlipidemia   . S/P TAVR (transcatheter aortic valve replacement)    a. 03/2015 26 mm Edwards Sapien 3 transcatheter heart valve placed via open left transfemoral approach.  . Skin lesions, generalized    facial which may represent actinic keratoses and possible photosensitivity from Amiodarone  . Unspecified essential hypertension    Past Surgical History:  Procedure Laterality Date  . CARDIAC CATHETERIZATION    . CARDIOVERSION  11/18/2006   Dr. [name]  . CATARACT EXTRACTION W/ INTRAOCULAR LENS IMPLANT  April '13  (Dr. [name])   left eye only  . CHOLECYSTECTOMY    . CORONARY ARTERY BYPASS GRAFT  02/09/1996   LIMA to diag-LAD, SVG to OM1-OM2-OM3, SVG to [facility]  . EYE SURGERY    . LEFT  AND RIGHT HEART CATHETERIZATION WITH CORONARY/GRAFT ANGIOGRAM N/A 01/26/2015   Procedure: LEFT AND RIGHT HEART CATHETERIZATION WITH [name];  Surgeon: [name], MD;  Location: [facility] CATH LAB;  Service: Cardiovascular;  Laterality: N/A;  . TEE WITHOUT CARDIOVERSION N/A 03/10/2015   Procedure: TRANSESOPHAGEAL ECHOCARDIOGRAM (TEE);  Surgeon: [name], MD;  Location: [place];  Service: Open Heart Surgery;  Laterality: N/A;  . TONSILLECTOMY    . TRANSCATHETER AORTIC VALVE REPLACEMENT, TRANSFEMORAL N/A 03/10/2015   Procedure: TRANSCATHETER AORTIC VALVE REPLACEMENT, TRANSFEMORAL;  Surgeon: [name], MD;  Location: [place];  Service: Open Heart Surgery;  Laterality: N/A;    reports that he has never smoked. He has never used smokeless tobacco. He reports that he does not drink alcohol or use drugs. family history includes Atrial fibrillation in his brother; Crohn's disease in his mother; Heart attack (age of onset: 53) in his father; Heart disease in his father and paternal uncle; Heart failure in his father; Prostate cancer in his paternal uncle; Stroke (age of onset: 25) in his mother. Allergies  Allergen Reactions  . Codeine Other (See Comments)    Pt does not remember  . Iodine Swelling     Review of Systems  Constitutional: Negative for appetite change, chills, fever and unexpected weight change.  HENT: Negative for congestion, postnasal drip, sinus pressure and sore throat.   Respiratory: Positive for cough. Negative for shortness of breath and wheezing.  Cardiovascular: Negative for chest pain.       Objective:   Physical Exam  Constitutional: He appears well-developed and well-nourished.  HENT:  Mouth/Throat: Oropharynx is clear and moist.  Neck: Neck supple.  Cardiovascular: Normal rate and regular rhythm.   Pulmonary/Chest: Effort normal. He has no wheezes.  Patient has a few faint crackles in both lung bases. No retractions. No wheezes.   Lymphadenopathy:    He has no cervical adenopathy.       Assessment:     Persistent cough-question post viral bronchitis    Plan:     -Obtain chest x-ray -If normal, observation and give this more time -Reluctant to use hydrocodone or codeine cough syrup with prior history of intolerance with codeine  [name] MD [facility] at [facility]

## 2016-11-15 NOTE — Progress Notes (Signed)
Pre visit review using our clinic review tool, if applicable. No additional management support is needed unless otherwise documented below in the visit note. 

## 2016-12-07 ENCOUNTER — Other Ambulatory Visit: Payer: Self-pay | Admitting: Family Medicine

## 2016-12-07 NOTE — Telephone Encounter (Signed)
Pt would like a refill. Please advise  

## 2016-12-07 NOTE — Telephone Encounter (Signed)
Refill OK

## 2016-12-09 ENCOUNTER — Ambulatory Visit (INDEPENDENT_AMBULATORY_CARE_PROVIDER_SITE_OTHER): Payer: Medicare Other | Admitting: General Practice

## 2016-12-09 DIAGNOSIS — Z952 Presence of prosthetic heart valve: Secondary | ICD-10-CM | POA: Diagnosis not present

## 2016-12-09 DIAGNOSIS — I482 Chronic atrial fibrillation, unspecified: Secondary | ICD-10-CM

## 2016-12-09 DIAGNOSIS — Z5181 Encounter for therapeutic drug level monitoring: Secondary | ICD-10-CM

## 2016-12-09 LAB — POCT INR: INR: 2.4

## 2016-12-09 NOTE — Patient Instructions (Signed)
Pre visit review using our clinic review tool, if applicable. No additional management support is needed unless otherwise documented below in the visit note. 

## 2016-12-09 NOTE — Progress Notes (Signed)
I have reviewed and agree with the plan. 

## 2016-12-27 ENCOUNTER — Other Ambulatory Visit: Payer: Self-pay | Admitting: Cardiovascular Disease

## 2016-12-27 NOTE — Telephone Encounter (Signed)
Rx refill sent to pharmacy. 

## 2017-01-02 IMAGING — DX DG CHEST 2V
2 series · 2 of 2 positions shown · non-contrast
Comparison: Radiograph March 11, 2015.

CLINICAL DATA: Productive cough for 2 weeks.

EXAM:
CHEST  2 VIEW

[chest pa]
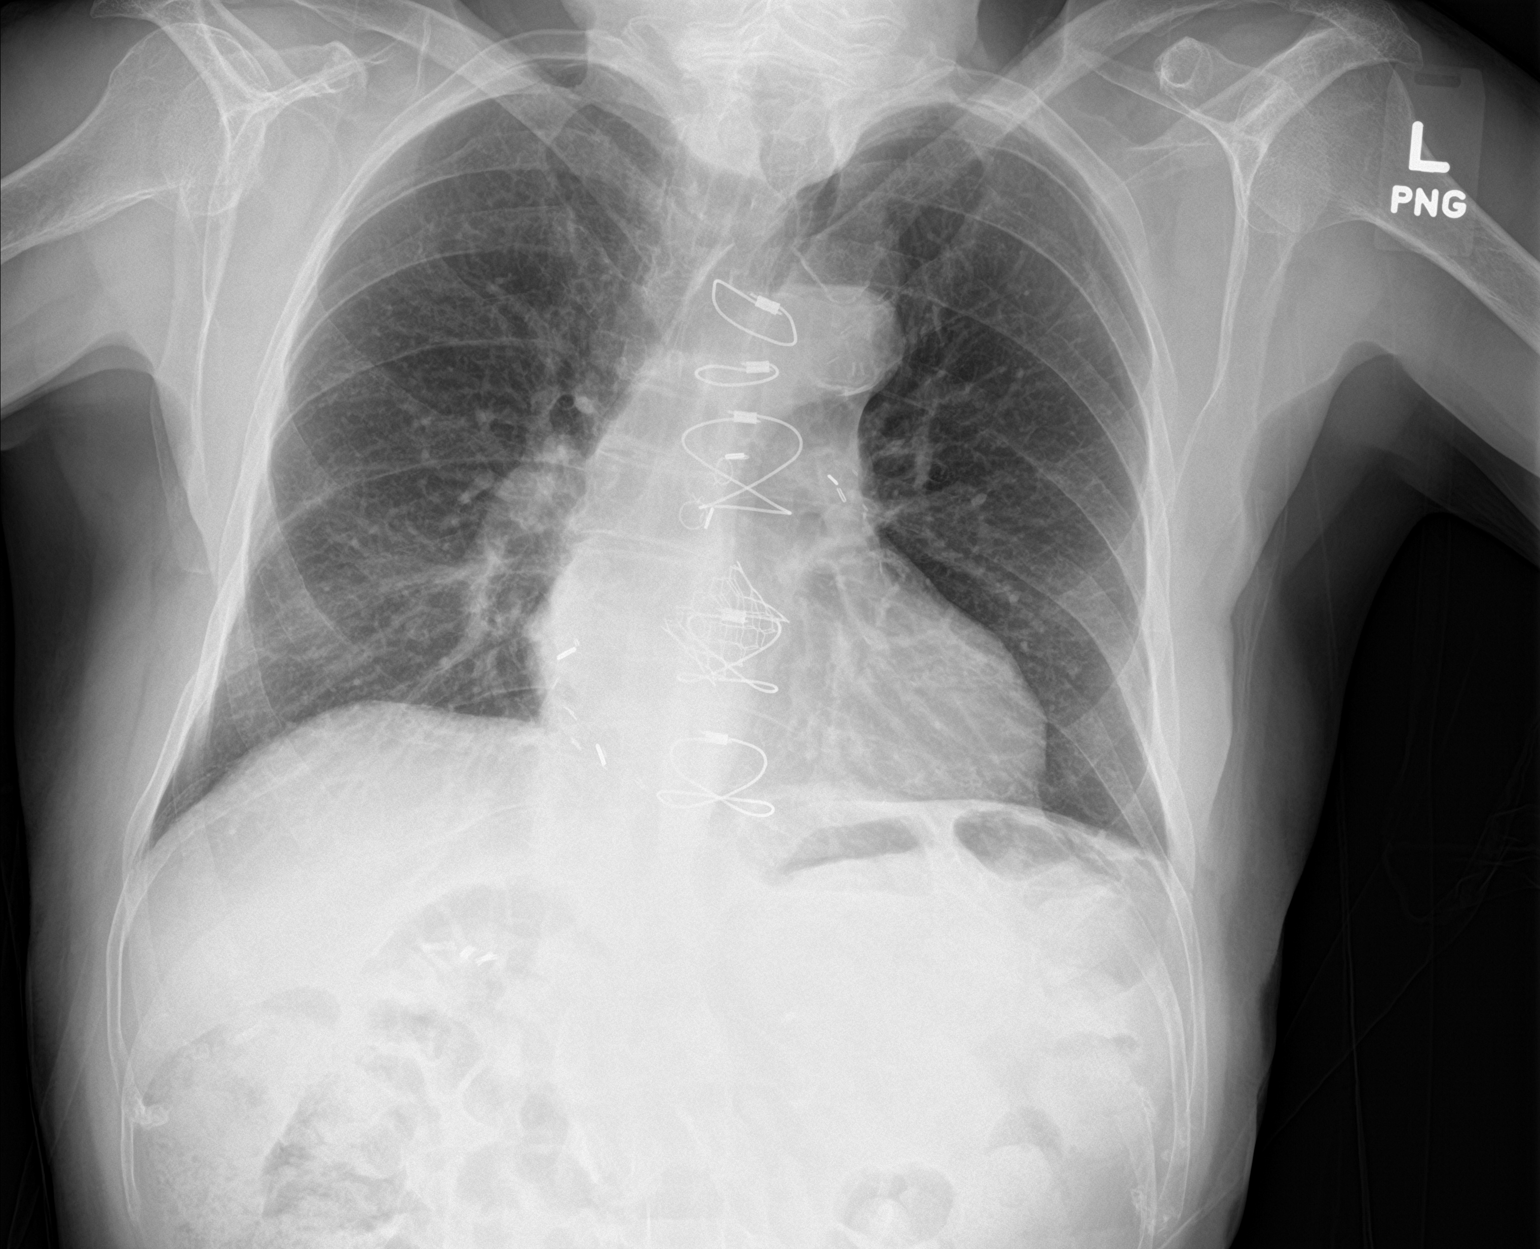

[chest lat]
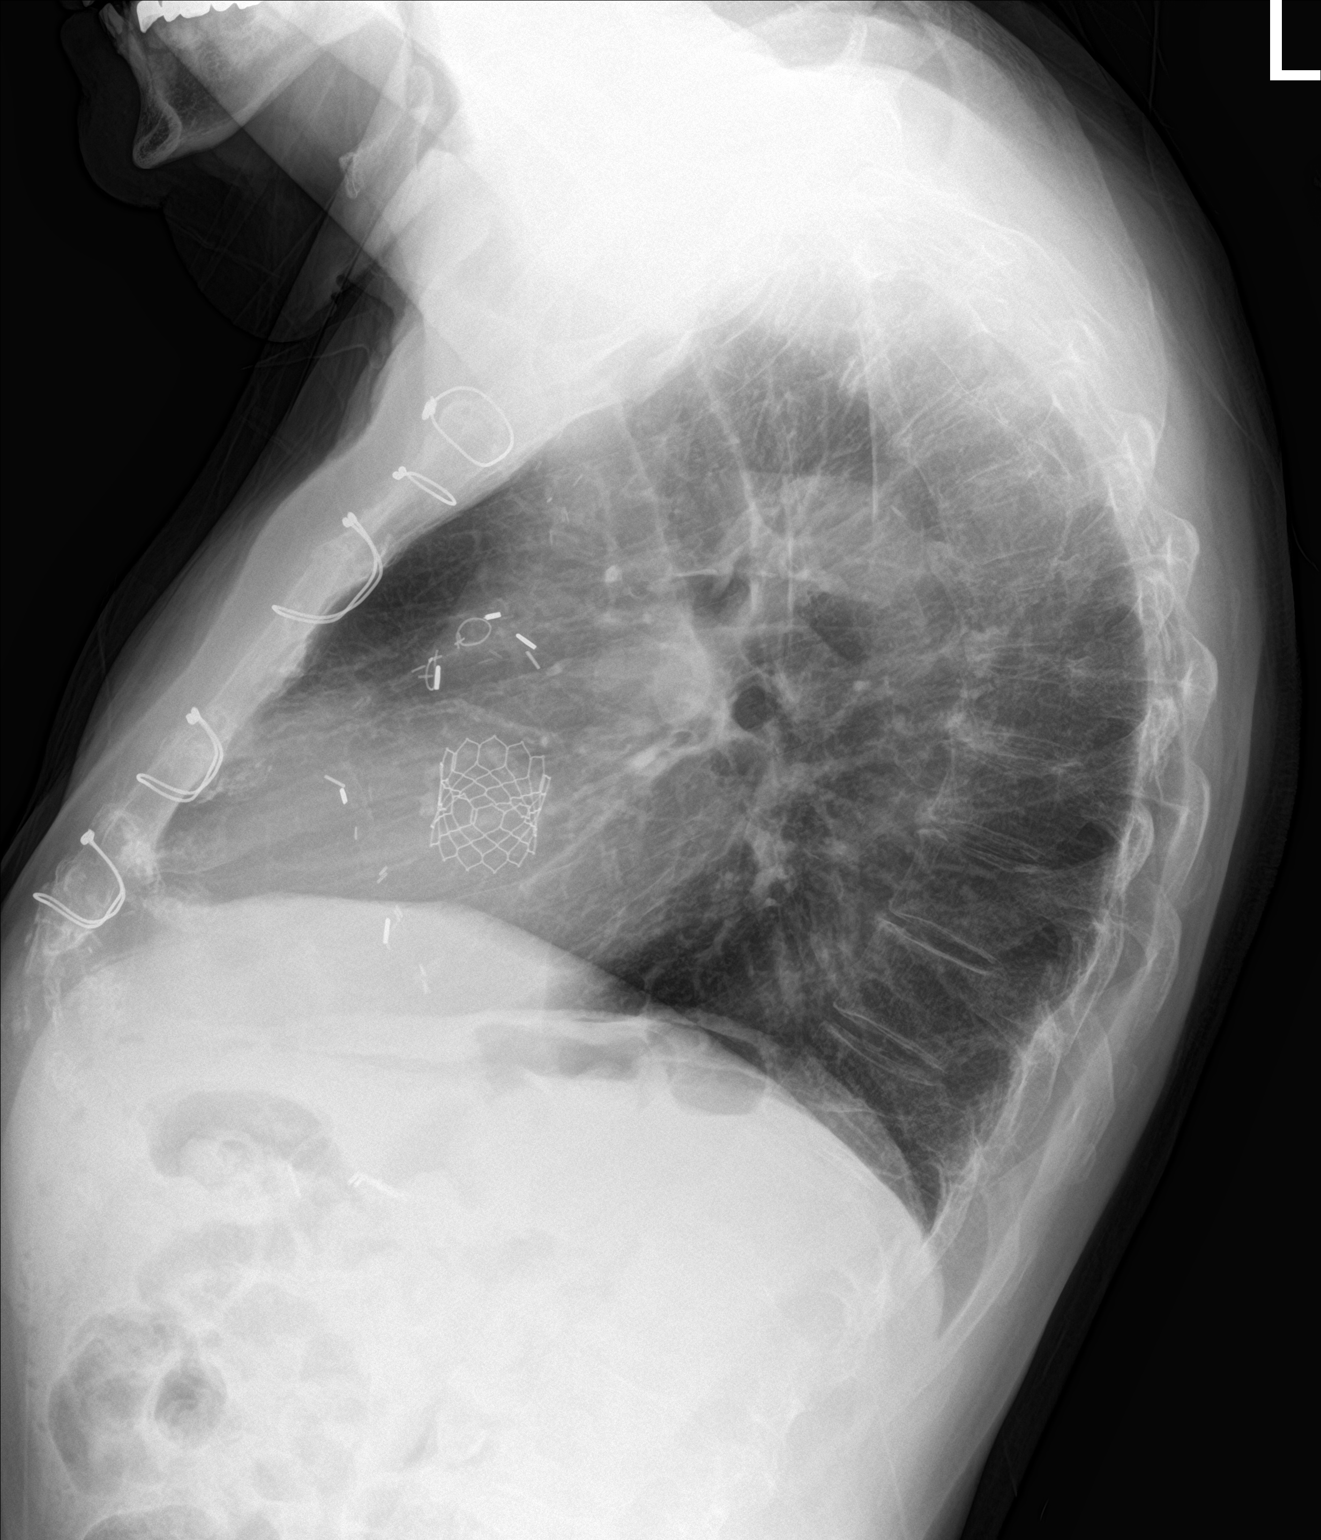

[2 of 2 positions shown; findings below may reference images not displayed]

FINDINGS: The heart size and mediastinal contours are within normal limits.
Both lungs are clear. Atherosclerosis of thoracic aorta is noted. No
pneumothorax or pleural effusion is noted. Status post aortic valve
repair. The visualized skeletal structures are unremarkable.
IMPRESSION: No active cardiopulmonary disease.  Aortic atherosclerosis.

## 2017-01-10 ENCOUNTER — Telehealth: Payer: Self-pay | Admitting: Family Medicine

## 2017-01-10 ENCOUNTER — Other Ambulatory Visit: Payer: Self-pay | Admitting: Family Medicine

## 2017-01-10 NOTE — Telephone Encounter (Signed)
Patient Name: Jesus [name]  DOB: 12-15-1940    Initial Comment Caller needs to change his liquid Amoxicillin back to capsules. Pharmacy says the liquid only lasts 14 days, but the capsules last much longer. Had an aorta valve put in and takes it an hour before he goes to the dentist.    Nurse Assessment      Guidelines    Guideline Title Affirmed Question Affirmed Notes       Final Disposition User   Clinical Call Dubois, RN, Langley Gauss    Comments  First attmept, person answering the phone states he is not avail. Advised to give pt msg that the RN cb from [facility]. Second attempt, person answering the phone states he has going home for the day.

## 2017-01-11 MED ORDER — AMOXICILLIN 500 MG PO CAPS: ORAL_CAPSULE | ORAL | 0 refills | Status: DC

## 2017-01-11 NOTE — Telephone Encounter (Signed)
Pt would like to change rx from liquid amoxicillin to capsules as they have a longer shelf-life.  Dr. Elease Hashimoto - Please advise. Thanks!

## 2017-01-11 NOTE — Telephone Encounter (Signed)
OK to use Amoxicillin 500 mg -take 4 capsules one hour prior to dental procedure.

## 2017-01-11 NOTE — Telephone Encounter (Signed)
Spoke with pt and advised. Rx sent. Nothing further needed.  

## 2017-01-13 ENCOUNTER — Ambulatory Visit (INDEPENDENT_AMBULATORY_CARE_PROVIDER_SITE_OTHER): Payer: Medicare Other | Admitting: General Practice

## 2017-01-13 DIAGNOSIS — I482 Chronic atrial fibrillation, unspecified: Secondary | ICD-10-CM

## 2017-01-13 DIAGNOSIS — Z952 Presence of prosthetic heart valve: Secondary | ICD-10-CM

## 2017-01-13 DIAGNOSIS — Z5181 Encounter for therapeutic drug level monitoring: Secondary | ICD-10-CM

## 2017-01-13 LAB — POCT INR: INR: 1.9

## 2017-01-13 NOTE — Patient Instructions (Signed)
Pre visit review using our clinic review tool, if applicable. No additional management support is needed unless otherwise documented below in the visit note. 

## 2017-01-13 NOTE — Progress Notes (Signed)
I have reviewed and agree with the plan. 

## 2017-01-16 ENCOUNTER — Telehealth: Payer: Self-pay | Admitting: Cardiovascular Disease

## 2017-01-16 NOTE — Telephone Encounter (Signed)
I spoke with the pt's wife and made her aware that Rx that was sent in for amoxicillin is not correct for SBE guidelines.  I advised her that the pt has to take Amoxicillin 500mg  4 capsules by mouth one hour prior to dental appointment. The pt just picked up 30 capsules so I will update the medication list with correct instructions.

## 2017-01-16 NOTE — Telephone Encounter (Signed)
New Message   Pt c/o medication issue:  1. Name of Medication: amoxicillin (AMOXIL) 500 MG capsule  2. How are you currently taking this medication (dosage and times per day)? Takes before dental appt  3. Are you having a reaction (difficulty breathing--STAT)? No  4. What is your medication issue? Pt questioning the dosage, and if its right. Requesting a call back

## 2017-01-18 ENCOUNTER — Telehealth: Payer: Self-pay | Admitting: Cardiovascular Disease

## 2017-01-18 NOTE — Telephone Encounter (Signed)
Did not need this encounter °

## 2017-01-23 ENCOUNTER — Telehealth: Payer: Self-pay | Admitting: Cardiovascular Disease

## 2017-01-23 NOTE — Telephone Encounter (Signed)
New message    She is seeing the pt 2/20 she needs ejection section of echo and diagnosis

## 2017-01-26 NOTE — Telephone Encounter (Signed)
Follow Up:    Jesus [name] says she is still waiting to get the information that she called for on 01-24-17.She needs the pt's last ejection fraction and diagnosis for CHF.

## 2017-01-26 NOTE — Telephone Encounter (Signed)
Janett Billow from [facility] was notified that the patient's last EF was 35-40% based on his last echocardiogram on 03/11/16 and diagnosis under reason for exam was: aortic valve disorder, ischemic cardiomyopathy, and s/p TAVR. Janett Billow was also provided with the last set of vitals from his office visit with [place] on 09/12/16.

## 2017-02-09 ENCOUNTER — Other Ambulatory Visit: Payer: Self-pay | Admitting: Family Medicine

## 2017-02-10 ENCOUNTER — Ambulatory Visit (INDEPENDENT_AMBULATORY_CARE_PROVIDER_SITE_OTHER): Payer: Medicare Other | Admitting: General Practice

## 2017-02-10 DIAGNOSIS — I482 Chronic atrial fibrillation, unspecified: Secondary | ICD-10-CM

## 2017-02-10 DIAGNOSIS — Z952 Presence of prosthetic heart valve: Secondary | ICD-10-CM

## 2017-02-10 DIAGNOSIS — Z5181 Encounter for therapeutic drug level monitoring: Secondary | ICD-10-CM

## 2017-02-10 LAB — POCT INR: INR: 2

## 2017-02-10 NOTE — Patient Instructions (Signed)
Pre visit review using our clinic review tool, if applicable. No additional management support is needed unless otherwise documented below in the visit note. 

## 2017-02-10 NOTE — Progress Notes (Signed)
I have reviewed and agree with the plan. 

## 2017-03-06 ENCOUNTER — Encounter: Payer: Self-pay | Admitting: Cardiovascular Disease

## 2017-03-10 ENCOUNTER — Ambulatory Visit (INDEPENDENT_AMBULATORY_CARE_PROVIDER_SITE_OTHER): Payer: Medicare Other | Admitting: General Practice

## 2017-03-10 DIAGNOSIS — I482 Chronic atrial fibrillation, unspecified: Secondary | ICD-10-CM

## 2017-03-10 DIAGNOSIS — Z952 Presence of prosthetic heart valve: Secondary | ICD-10-CM

## 2017-03-10 DIAGNOSIS — Z5181 Encounter for therapeutic drug level monitoring: Secondary | ICD-10-CM

## 2017-03-10 LAB — POCT INR: INR: 2.6

## 2017-03-10 NOTE — Patient Instructions (Signed)
Pre visit review using our clinic review tool, if applicable. No additional management support is needed unless otherwise documented below in the visit note. 

## 2017-03-10 NOTE — Progress Notes (Signed)
I have reviewed and agree with the plan. 

## 2017-03-14 ENCOUNTER — Other Ambulatory Visit: Payer: Self-pay

## 2017-03-14 ENCOUNTER — Ambulatory Visit (HOSPITAL_COMMUNITY): Payer: Medicare Other | Attending: Internal Medicine

## 2017-03-14 DIAGNOSIS — I482 Chronic atrial fibrillation, unspecified: Secondary | ICD-10-CM

## 2017-03-14 DIAGNOSIS — I359 Nonrheumatic aortic valve disorder, unspecified: Secondary | ICD-10-CM

## 2017-03-14 DIAGNOSIS — Z5181 Encounter for therapeutic drug level monitoring: Secondary | ICD-10-CM

## 2017-03-14 DIAGNOSIS — Z952 Presence of prosthetic heart valve: Secondary | ICD-10-CM | POA: Insufficient documentation

## 2017-03-14 DIAGNOSIS — I341 Nonrheumatic mitral (valve) prolapse: Secondary | ICD-10-CM | POA: Diagnosis not present

## 2017-03-14 DIAGNOSIS — I351 Nonrheumatic aortic (valve) insufficiency: Secondary | ICD-10-CM | POA: Insufficient documentation

## 2017-03-14 DIAGNOSIS — I501 Left ventricular failure: Secondary | ICD-10-CM | POA: Diagnosis not present

## 2017-03-14 DIAGNOSIS — I420 Dilated cardiomyopathy: Secondary | ICD-10-CM | POA: Diagnosis not present

## 2017-03-20 ENCOUNTER — Encounter: Payer: Self-pay | Admitting: Cardiovascular Disease

## 2017-03-20 ENCOUNTER — Ambulatory Visit (INDEPENDENT_AMBULATORY_CARE_PROVIDER_SITE_OTHER): Payer: Medicare Other | Admitting: Cardiovascular Disease

## 2017-03-20 ENCOUNTER — Encounter (INDEPENDENT_AMBULATORY_CARE_PROVIDER_SITE_OTHER): Payer: Self-pay

## 2017-03-20 VITALS — BP 138/60 | HR 52 | Ht 62.0 in | Wt 131.1 lb

## 2017-03-20 DIAGNOSIS — Z952 Presence of prosthetic heart valve: Secondary | ICD-10-CM

## 2017-03-20 DIAGNOSIS — I482 Chronic atrial fibrillation, unspecified: Secondary | ICD-10-CM

## 2017-03-20 DIAGNOSIS — I359 Nonrheumatic aortic valve disorder, unspecified: Secondary | ICD-10-CM | POA: Diagnosis not present

## 2017-03-20 NOTE — Progress Notes (Signed)
Cardiology Office Note Date:  03/20/2017   ID:  Jesus [name], DOB [birth year], MRN [id number]  PCP:  [name], MD  Cardiologist:  [name], MD    Chief Complaint  Patient presents with  . Aortic Stenosis    s/p TAVR     History of Present Illness: Jesus [name] is a 76 y.o. male who presents for follow-up of aortic valve disease, coronary disease, paroxysmal atrial fibrillation, and hypertension.  The patient had remote inferior MI in 1980. He underwent CABG in 1997. He developed progressive symptoms of severe aortic stenosis and underwent TAVR in April 2016 via a transfemoral approach. He was treated with a 26 mm Sapien 3 transcatheter heart valve. Postop echo has shown normal transvalvular velocities and mild aortic insufficiency.  The patient is here with his wife today. He is doing well. Reports no changes in his medications. He plans to discontinue Protonix in the near future and switch to an H2 blocker. No changes in his cardiac medicines are reported. He remains active. Today, he denies symptoms of palpitations, chest pain, shortness of breath, orthopnea, PND, lower extremity edema, dizziness, or syncope.   Past Medical History:  Diagnosis Date  . 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 Aortic stenosis    mild with a mean aortic valve gradient of 12 mmHg  . Atrial fibrillation (HCC)    holding sinus rhythm on Amiodarone  . CAD (coronary artery disease)    a. S/P Ant MI 1980;  b. 1997 S/P CABG x 8 (LIMA to diag-LAD, SVG to OM1-OM2-OM3, SVG to [facility] - Dr [name]);  c. 01/2015 Cath: 3VD, 8/8 patent grafts.  . Cardiomyopathy   . Dysrhythmia   . Esophageal reflux   . GERD (gastroesophageal reflux disease)   . Headache   . History of colonoscopy   . History of transesophageal echocardiography (TEE) for monitoring   . Other and unspecified hyperlipidemia   . S/P TAVR (transcatheter aortic valve replacement)    a. 03/2015 26 mm  Edwards Sapien 3 transcatheter heart valve placed via open left transfemoral approach.  . Skin lesions, generalized    facial which may represent actinic keratoses and possible photosensitivity from Amiodarone  . Unspecified essential hypertension     Past Surgical History:  Procedure Laterality Date  . CARDIAC CATHETERIZATION    . CARDIOVERSION  11/18/2006   Dr. [name]  . CATARACT EXTRACTION W/ INTRAOCULAR LENS IMPLANT  April '13  (Dr. [name])   left eye only  . CHOLECYSTECTOMY    . CORONARY ARTERY BYPASS GRAFT  02/09/1996   LIMA to diag-LAD, SVG to OM1-OM2-OM3, SVG to [facility]  . EYE SURGERY    . LEFT AND RIGHT HEART CATHETERIZATION WITH CORONARY/GRAFT ANGIOGRAM N/A 01/26/2015   Procedure: LEFT AND RIGHT HEART CATHETERIZATION WITH [name];  Surgeon: [name], MD;  Location: [facility] CATH LAB;  Service: Cardiovascular;  Laterality: N/A;  . TEE WITHOUT CARDIOVERSION N/A 03/10/2015   Procedure: TRANSESOPHAGEAL ECHOCARDIOGRAM (TEE);  Surgeon: [name], MD;  Location: [place];  Service: Open Heart Surgery;  Laterality: N/A;  . TONSILLECTOMY    . TRANSCATHETER AORTIC VALVE REPLACEMENT, TRANSFEMORAL N/A 03/10/2015   Procedure: TRANSCATHETER AORTIC VALVE REPLACEMENT, TRANSFEMORAL;  Surgeon: [name], MD;  Location: [place];  Service: Open Heart Surgery;  Laterality: N/A;    Current Outpatient Prescriptions  Medication Sig Dispense Refill  . acetaminophen (TYLENOL) 500 MG tablet Take 500 mg by mouth every 6 (six) hours as needed for  moderate pain.    [name] amiodarone (PACERONE) 200 MG tablet Take 0.5 tablets (100 mg total) by mouth daily. 15 tablet 5  . amLODipine (NORVASC) 5 MG tablet TAKE 1 TABLET BY MOUTH DAILY 90 tablet 0  . amoxicillin (AMOXIL) 500 MG capsule Take 2,000 mg by mouth as directed. TAKE FOUR CAPSULES ONE HOUR PRIOR TO DENTAL PROCEDURES 30 capsule 0  . Ascorbic Acid (VITAMIN C) 1000 MG tablet Take 1,000 mg by mouth daily.      [name] aspirin 81 MG  tablet Take 81 mg by mouth daily.      [name] atorvastatin (LIPITOR) 80 MG tablet TAKE 1/2 TABLET BY MOUTH DAILY 45 tablet 5  . loratadine (CLARITIN) 10 MG tablet Take 10 mg by mouth daily as needed for allergies.     [name] losartan (COZAAR) 100 MG tablet TAKE 1 TABLET BY MOUTH ONCE DAILY. 90 tablet 2  . Multiple Vitamins-Minerals (MULTIVITAMIN,TX-MINERALS) tablet Take 1 tablet by mouth daily.      . pantoprazole (PROTONIX) 40 MG tablet TAKE 1 TABLET BY MOUTH ONCE DAILY 90 tablet 1  . traMADol (ULTRAM) 50 MG tablet Take 1-2 tablets (50-100 mg total) by mouth every 4 (four) hours as needed for moderate pain. 30 tablet 0  . warfarin (COUMADIN) 5 MG tablet TAKE AS DIRECTED BY COUMADIN CLINIC. 90 day. 90 tablet 1   No current facility-administered medications for this visit.     Allergies:   Codeine and Iodine   Social History:  The patient  reports that he has never smoked. He has never used smokeless tobacco. He reports that he does not drink alcohol or use drugs.   Family History:  The patient's  family history includes Atrial fibrillation in his brother; Crohn's disease in his mother; Heart attack (age of onset: 1) in his father; Heart disease in his father and paternal uncle; Heart failure in his father; Prostate cancer in his paternal uncle; Stroke (age of onset: 31) in his mother.    ROS:  Please see the history of present illness.  All other systems are reviewed and negative.    PHYSICAL EXAM: VS:  BP 138/60   Pulse (!) 52   Ht _0  (1.575 m)   Wt 131 lb 1.9 oz (59.5 kg)   BMI 23.98 kg/m  , BMI Body mass index is 23.98 kg/m. GEN: Well nourished, well developed, in no acute distress  HEENT: normal  Neck: no JVD, no masses. No carotid bruits Cardiac: RRR with 2/6 systolic ejection murmur at the right upper sternal border and grade 2/6 diastolic decrescendo murmur at the left lower sternal border       Respiratory:  clear to auscultation bilaterally, normal work of breathing GI: soft,  nontender, nondistended, + BS MS: no deformity or atrophy  Ext: no pretibial edema, pedal pulses 2+= bilaterally Skin: warm and dry, no rash Neuro:  Strength and sensation are intact Psych: euthymic mood, full affect  EKG:  EKG is ordered today. The ekg ordered today shows sinus bradycardia 53 bpm, inferior infarct age undetermined, otherwise within normal limits. No significant change from previous tracing.  Recent Labs: 09/12/2016: ALT 17; BUN 16; Creat 0.91; Potassium 3.9; Sodium 139; TSH 2.28   Lipid Panel     Component Value Date/Time   CHOL 134 09/12/2016 1311   TRIG 119 09/12/2016 1311   TRIG 67 11/20/2006 0748   HDL 45 09/12/2016 1311   CHOLHDL 3.0 09/12/2016 1311   VLDL 24 09/12/2016 1311   LDLCALC  65 09/12/2016 1311      Wt Readings from Last 3 Encounters:  03/20/17 131 lb 1.9 oz (59.5 kg)  11/15/16 129 lb (58.5 kg)  10/25/16 130 lb 8 oz (59.2 kg)     Cardiac Studies Reviewed: 2D Echo 03/14/2017: Study Conclusions  - Left ventricle: The cavity size was normal. Wall thickness was   increased in a pattern of mild LVH. The estimated ejection   fraction was 40%. Global hypokinesis with regional variation   inferiorly. Doppler parameters are consistent with abnormal left   ventricular relaxation (grade 1 diastolic dysfunction). The E/e&'   ratio is >15, suggesting elevated LV filling pressure. Ejection   fraction (MOD, 2-plane): 40%. - Aortic valve: S/p 26 mm Sapien 3 TAVR. No paravalvular leak. No   obstruction. There was trivial regurgitation. - Mitral valve: Calcified annulus. Mildly thickened leaflets .   There was trivial regurgitation. - Left atrium: Severely dilated. - Right ventricle: The cavity size was mildly dilated. - Inferior vena cava: The vessel was normal in size. The   respirophasic diameter changes were in the normal range (= 50%),   consistent with normal central venous pressure.  Impressions:  - Compared to a prior study in 2017, there  has been no change.   ASSESSMENT AND PLAN: 1.  Aortic valve disease status post TAVR: The patient's echo is reviewed and it demonstrates normal function of his transcatheter heart valve. On exam there is a diastolic murmur c/w at least mild AI (no change from previous). He follows SBE prophylaxis.  2. Coronary artery disease, native vessel: Patient without symptoms of angina. He will be continued on his medical program without changes.  3. Paroxysmal atrial fibrillation: The patient continues to tolerate amiodarone. He reports no bleeding problems with chronic anticoagulation using warfarin. Will draw TSH and LFT's for amiodarone surveillance.   4. Essential hypertension: Treated with losartan and amlodipine. BP well-controlled.   5. Hyperlipidemia: Treated with atorvastatin. Last lipids reviewed and his LDL cholesterol 65 mg/dL.  6. Chronic systolic heart failure: NYHA I symptoms. He is doing very well with his known chronic LV dysfunction related to a remote inferior MI. He has never been hospitalized for heart failure, nor has he had issues with volume overload. His current medicines will be continued. He does not require diuretic therapy. He had significant symptomatic improvement following TAVR.  Current medicines are reviewed with the patient today.  The patient does not have concerns regarding medicines.  Labs/ tests ordered today include:   Orders Placed This Encounter  Procedures  . TSH  . T4, free  . Comp Met (CMET)  . EKG 12-Lead    Disposition:   FU 6 months  Signed, [name], MD  03/20/2017 5:54 PM    [facility] [place], [place], [place]  [address] Phone: [phone]; Fax: [phone]

## 2017-03-20 NOTE — Patient Instructions (Signed)
Medication Instructions:  Your physician recommends that you continue on your current medications as directed. Please refer to the Current Medication list given to you today.  Labwork: Your physician recommends that you have lab work today: CMP, TSH and Free T4  Testing/Procedures: No new orders.   Follow-Up: Your physician wants you to follow-up in: 6 MONTHS with [name].  You will receive a reminder letter in the mail two months in advance. If you don't receive a letter, please call our office to schedule the follow-up appointment.   Any Other Special Instructions Will Be Listed Below (If Applicable).     If you need a refill on your cardiac medications before your next appointment, please call your pharmacy.

## 2017-03-21 ENCOUNTER — Encounter: Payer: Self-pay | Admitting: Family Medicine

## 2017-03-21 ENCOUNTER — Ambulatory Visit (INDEPENDENT_AMBULATORY_CARE_PROVIDER_SITE_OTHER): Payer: Medicare Other | Admitting: Family Medicine

## 2017-03-21 VITALS — BP 130/60 | HR 54 | Temp 98.2°F | Wt 131.7 lb

## 2017-03-21 DIAGNOSIS — E785 Hyperlipidemia, unspecified: Secondary | ICD-10-CM | POA: Diagnosis not present

## 2017-03-21 DIAGNOSIS — I10 Essential (primary) hypertension: Secondary | ICD-10-CM | POA: Diagnosis not present

## 2017-03-21 DIAGNOSIS — I482 Chronic atrial fibrillation, unspecified: Secondary | ICD-10-CM

## 2017-03-21 DIAGNOSIS — L570 Actinic keratosis: Secondary | ICD-10-CM

## 2017-03-21 LAB — COMPREHENSIVE METABOLIC PANEL
A/G RATIO: 1.5 (ref 1.2–2.2)
ALT: 20 IU/L (ref 0–44)
AST: 20 IU/L (ref 0–40)
Albumin: 3.7 g/dL (ref 3.5–4.8)
Alkaline Phosphatase: 120 IU/L — ABNORMAL HIGH (ref 39–117)
BUN/Creatinine Ratio: 18 (ref 10–24)
BUN: 17 mg/dL (ref 8–27)
Bilirubin Total: 0.4 mg/dL (ref 0.0–1.2)
CALCIUM: 8.5 mg/dL — AB (ref 8.6–10.2)
CO2: 22 mmol/L (ref 18–29)
Chloride: 104 mmol/L (ref 96–106)
Creatinine, Ser: 0.94 mg/dL (ref 0.76–1.27)
GFR, EST AFRICAN AMERICAN: 89 mL/min/{1.73_m2} (ref >59)
GFR, EST NON AFRICAN AMERICAN: 77 mL/min/{1.73_m2} (ref >59)
GLOBULIN, TOTAL: 2.5 g/dL (ref 1.5–4.5)
Glucose: 114 mg/dL — ABNORMAL HIGH (ref 65–99)
POTASSIUM: 4.2 mmol/L (ref 3.5–5.2)
SODIUM: 142 mmol/L (ref 134–144)
Total Protein: 6.2 g/dL (ref 6.0–8.5)

## 2017-03-21 LAB — T4, FREE: Free T4: 1.29 ng/dL (ref 0.82–1.77)

## 2017-03-21 LAB — TSH: TSH: 3.34 u[IU]/mL (ref 0.450–4.500)

## 2017-03-21 NOTE — Progress Notes (Signed)
Subjective:     Patient ID: Jesus [name], male   DOB: [birth year], 76 y.o.   MRN: [id number]  HPI Patient seen for medical follow-up. Has history of aortic stenosis as well as CAD and atrial fibrillation. He's had previous aortic valve replacement. Other medical problems including history of hypertension, hyperlipidemia, chronic Coumadin therapy.  Patient just yesterday had labs done through cardiology including chemistries and thyroid functions which were stable. He remains on amiodarone. Other medications reviewed and compliant with all. No recent chest pains. No dizziness. He had one recent fall when he was going out his outbuilding and it was raining and he slipped on a board. Otherwise feels very stable. He is doing exercise classes a few times per week.  Patient has some scaly areas on the dorsal aspect of both hands and also right side of face. Prior history of significant sun exposure. No known history of skin cancer.  Past Medical History:  Diagnosis Date  . 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 Aortic stenosis    mild with a mean aortic valve gradient of 12 mmHg  . Atrial fibrillation (HCC)    holding sinus rhythm on Amiodarone  . CAD (coronary artery disease)    a. S/P Ant MI 1980;  b. 1997 S/P CABG x 8 (LIMA to diag-LAD, SVG to OM1-OM2-OM3, SVG to [facility] - Dr [name]);  c. 01/2015 Cath: 3VD, 8/8 patent grafts.  . Cardiomyopathy   . Dysrhythmia   . Esophageal reflux   . GERD (gastroesophageal reflux disease)   . Headache   . History of colonoscopy   . History of transesophageal echocardiography (TEE) for monitoring   . Other and unspecified hyperlipidemia   . S/P TAVR (transcatheter aortic valve replacement)    a. 03/2015 26 mm Edwards Sapien 3 transcatheter heart valve placed via open left transfemoral approach.  . Skin lesions, generalized    facial which may represent actinic keratoses and possible photosensitivity from Amiodarone  . Unspecified  essential hypertension    Past Surgical History:  Procedure Laterality Date  . CARDIAC CATHETERIZATION    . CARDIOVERSION  11/18/2006   Dr. [name]  . CATARACT EXTRACTION W/ INTRAOCULAR LENS IMPLANT  April '13  (Dr. [name])   left eye only  . CHOLECYSTECTOMY    . CORONARY ARTERY BYPASS GRAFT  02/09/1996   LIMA to diag-LAD, SVG to OM1-OM2-OM3, SVG to [facility]  . EYE SURGERY    . LEFT AND RIGHT HEART CATHETERIZATION WITH CORONARY/GRAFT ANGIOGRAM N/A 01/26/2015   Procedure: LEFT AND RIGHT HEART CATHETERIZATION WITH [name];  Surgeon: [name], MD;  Location: [facility] CATH LAB;  Service: Cardiovascular;  Laterality: N/A;  . TEE WITHOUT CARDIOVERSION N/A 03/10/2015   Procedure: TRANSESOPHAGEAL ECHOCARDIOGRAM (TEE);  Surgeon: [name], MD;  Location: [place];  Service: Open Heart Surgery;  Laterality: N/A;  . TONSILLECTOMY    . TRANSCATHETER AORTIC VALVE REPLACEMENT, TRANSFEMORAL N/A 03/10/2015   Procedure: TRANSCATHETER AORTIC VALVE REPLACEMENT, TRANSFEMORAL;  Surgeon: [name], MD;  Location: [place];  Service: Open Heart Surgery;  Laterality: N/A;    reports that he has never smoked. He has never used smokeless tobacco. He reports that he does not drink alcohol or use drugs. family history includes Atrial fibrillation in his brother; Crohn's disease in his mother; Heart attack (age of onset: 83) in his father; Heart disease in his father and paternal uncle; Heart failure in his father; Prostate cancer in his paternal uncle; Stroke (age of onset: 54)  in his mother. Allergies  Allergen Reactions  . Codeine Other (See Comments)    Pt does not remember  . Iodine Swelling     Review of Systems  Constitutional: Negative for fatigue.  Eyes: Negative for visual disturbance.  Respiratory: Negative for cough, chest tightness and shortness of breath.   Cardiovascular: Negative for chest pain, palpitations and leg swelling.  Neurological: Negative for dizziness,  syncope, weakness, light-headedness and headaches.  Hematological: Negative for adenopathy.  Psychiatric/Behavioral: Negative for dysphoric mood.       Objective:   Physical Exam  Constitutional: He is oriented to person, place, and time. He appears well-developed and well-nourished.  Neck: Neck supple. No thyromegaly present.  Cardiovascular: Normal rate and regular rhythm.   Pulmonary/Chest: Effort normal and breath sounds normal. No respiratory distress. He has no wheezes. He has no rales.  Musculoskeletal: He exhibits no edema.  Neurological: He is alert and oriented to person, place, and time. No cranial nerve deficit.  Skin:  Patient has several thickened hyperkeratotic skin lesions consistent with actinic keratoses including one on the dorsum of the right hand, 2 on the dorsal left hand, and one right temporal region. No ulceration.       Assessment:     #1 history of previous transcatheter aortic valve replacement on Coumadin  #2 history of atrial fibrillation  #3 hypertension stable and at goal  #4 history of dyslipidemia. Lipids from last fall were reviewed and we'll plan to recheck in 6 months  #5 actinic keratoses-hands and face.      Plan:     -Continue current medications -Discussed the importance of regular exercises, especially for balance -We'll plan on routine follow-up in 6 months and repeat lab work then -We discussed risk and benefits of liquid nitrogen for his actinic keratoses treatment. Patient consents. We treated one lesion dorsum right hand to dorsum left hand and one right temporal region without difficulty -Discussed importance of sun protection  [name] MD [facility] at [facility]

## 2017-03-21 NOTE — Progress Notes (Signed)
Pre visit review using our clinic review tool, if applicable. No additional management support is needed unless otherwise documented below in the visit note. 

## 2017-03-21 NOTE — Patient Instructions (Signed)
Actinic Keratosis An actinic keratosis is a precancerous growth on the skin. This means that it could develop into skin cancer if it is not treated. About 1% of these growths (actinic keratoses) turn into skin cancer within one year if they are not treated. It is important to have all of these growths evaluated to determine the best treatment approach. What are the causes? This condition is caused by getting too much ultraviolet (UV) radiation from the sun or other UV light sources. What increases the risk? The following factors may make you more likely to develop this condition:  Having light-colored skin and blue eyes.  Having blonde or red hair.  Spending a lot of time in the sun.  Inadequate skin protection when outdoors. This may include:  Not using sunscreen properly.  Not covering up skin that is exposed to sunlight.  Aging. The risk of developing an actinic keratosis increases with age. What are the signs or symptoms? Actinic keratoses look like scaly, rough spots of skin.They can be as small as a pinhead or as big as a quarter. They may itch, hurt, or feel sensitive. In most cases, the growths become red. In some cases, they may be skin-colored, light tan, dark tan, pink, or a combination of any of these colors. There may be a small piece of pink or gray skin (skin tag) growing from the actinic keratosis. In some cases, it may be easier to notice actinic keratoses by feeling them, rather than seeing them. Actinic keratoses appear most often on areas of skin that get a lot of sun exposure, including the scalp, face, ears, lips, upper back, forearms, and the backs of the hands. Sometimes, actinic keratoses disappear, but many reappear a few days to a few weeks later. How is this diagnosed? This condition is usually diagnosed with a physical exam. A tissue sample may be removed from the actinic keratosis and examined under a microscope (biopsy). How is this treated?   Treatment for  this condition may include:  Scraping off the actinic keratosis (curettage).  Freezing the actinic keratosis with liquid nitrogen (cryosurgery). This causes the growth to eventually fall off the skin.  Applying medicated creams or gels to destroy the cells in the growth.  Applying chemicals to the actinic keratosis to make the outer layers of skin peel off (chemical peel).  Photodynamic therapy. In this procedure, medicated cream is applied to the actinic keratosis. This cream increases your skin's sensitivity to light. Then, a strong light is aimed at the actinic keratosis to destroy cells in the growth. Follow these instructions at home: Skin care   Apply cool, wet cloths (cool compresses) to the affected areas.  Do not scratch your skin.  Check your skin regularly for any growths, especially growths that:  Start to itch or bleed.  Change in size, shape, or color. Caring for the treated area   Keep the treated area clean and dry as told by your health care provider.  Do not apply any medicine, cream, or lotion to the treated area unless your health care provider tells you to do that.  Do not pick at blisters or try to break them open. This can cause infection and scarring.  If you have red or irritated skin after treatment, follow instructions from your health care provider about how to take care of the treated area. Make sure you:  Wash your hands with soap and water before you change your bandage (dressing). If soap and water are not available,   use hand sanitizer.  Change your dressing as told by your health care provider.  If you have red or irritated skin after treatment, check your treated area every day for signs of infection. Check for:  Swelling, pain, or more redness.  Fluid or blood.  Warmth.  Pus or a bad smell. General instructions   Take over-the-counter and prescription medicines only as told by your health care provider.  Return to your normal  activities as told by your health care provider. Ask your health care provider what activities are safe for you.  Do not use any tobacco products, such as cigarettes, chewing tobacco, and e-cigarettes. If you need help quitting, ask your health care provider.  Have a skin exam done every year by a health care provider who is a skin conditions specialist (dermatologist).  Keep all follow-up visits as told by your health care provider. This is important. How is this prevented?  Do not get sunburns.  Try to avoid the sun between 10:00 a.m. and 4:00 p.m. This is when the UV light is the strongest.  Use a sunscreen or sunblock with SPF 30 (sun protection factor 30) or greater.  Apply sunscreen before you are exposed to sunlight, and reapply periodically as often as directed by the instructions on the sunscreen container.  Always wear sunglasses that have UV protection, and always wear hats and clothing to protect your skin from sunlight.  When possible, avoid medicines that increase your sensitivity to sunlight. These include:  Certain antibiotic medicines.  Certain water pills (diuretics).  Certain prescription medicines that are used to treat acne (retinoids).  Do not use tanning beds or other indoor tanning devices. Contact a health care provider if:  You notice any changes or new growths on your skin.  You have swelling, pain, or more redness around your treated area.  You have fluid or blood coming from your treated area.  Your treated area feels warm to the touch.  You have pus or a bad smell coming from your treated area.  You have a fever.  You have a blister that becomes large and painful. This information is not intended to replace advice given to you by your health care provider. Make sure you discuss any questions you have with your health care provider. Document Released: 02/17/2009 Document Revised: 07/22/2016 Document Reviewed: 08/01/2015 Elsevier Interactive  Patient Education  2017 [employer]  

## 2017-03-24 ENCOUNTER — Ambulatory Visit: Payer: Medicare Other | Admitting: Cardiovascular Disease

## 2017-03-27 ENCOUNTER — Other Ambulatory Visit: Payer: Self-pay | Admitting: Cardiovascular Disease

## 2017-04-07 ENCOUNTER — Ambulatory Visit (INDEPENDENT_AMBULATORY_CARE_PROVIDER_SITE_OTHER): Payer: Medicare Other | Admitting: General Practice

## 2017-04-07 DIAGNOSIS — Z952 Presence of prosthetic heart valve: Secondary | ICD-10-CM | POA: Diagnosis not present

## 2017-04-07 DIAGNOSIS — I482 Chronic atrial fibrillation, unspecified: Secondary | ICD-10-CM

## 2017-04-07 DIAGNOSIS — Z5181 Encounter for therapeutic drug level monitoring: Secondary | ICD-10-CM

## 2017-04-07 LAB — POCT INR: INR: 2.5

## 2017-04-07 NOTE — Patient Instructions (Signed)
Pre visit review using our clinic review tool, if applicable. No additional management support is needed unless otherwise documented below in the visit note. 

## 2017-04-07 NOTE — Progress Notes (Signed)
I have reviewed and agree with the plan. 

## 2017-04-12 DIAGNOSIS — Z961 Presence of intraocular lens: Secondary | ICD-10-CM | POA: Diagnosis not present

## 2017-04-12 DIAGNOSIS — H01001 Unspecified blepharitis right upper eyelid: Secondary | ICD-10-CM | POA: Diagnosis not present

## 2017-04-12 DIAGNOSIS — H52203 Unspecified astigmatism, bilateral: Secondary | ICD-10-CM | POA: Diagnosis not present

## 2017-04-12 DIAGNOSIS — H25811 Combined forms of age-related cataract, right eye: Secondary | ICD-10-CM | POA: Diagnosis not present

## 2017-04-15 DIAGNOSIS — I2510 Atherosclerotic heart disease of native coronary artery without angina pectoris: Secondary | ICD-10-CM | POA: Diagnosis not present

## 2017-04-15 DIAGNOSIS — J069 Acute upper respiratory infection, unspecified: Secondary | ICD-10-CM | POA: Diagnosis not present

## 2017-04-15 DIAGNOSIS — J209 Acute bronchitis, unspecified: Secondary | ICD-10-CM | POA: Diagnosis not present

## 2017-04-17 ENCOUNTER — Telehealth: Payer: Self-pay | Admitting: Cardiovascular Disease

## 2017-04-17 NOTE — Telephone Encounter (Signed)
I spoke with the pt and he needs clearance for eye surgery and to hold warfarin 5 days prior to surgery with Dr [name].  I made the pt aware that we need the surgeon's office to fax a request into the office or call the office with request for surgical clearance.  I provided the pt with our office fax number.

## 2017-04-17 NOTE — Telephone Encounter (Signed)
Please see follow-up phone call encounter in regards to surgical clearance.

## 2017-04-17 NOTE — Telephone Encounter (Signed)
Pt is stable to proceed with cataract surgery. Typically anticoagulant medications do not need to be held for cataracts, but if necessary he can hold ASA x 7 days. Would continue warfarin unless some compelling reason to stop it. thanks

## 2017-04-17 NOTE — Telephone Encounter (Signed)
New message   Jesus [name] is calling for medical clearance.   Request for surgical clearance:  1. What type of surgery is being performed? Cataract surgery  2. When is this surgery scheduled? May 22nd  3. Are there any medications that need to be held prior to surgery and how long? They are asking how long Coumadin and aspirin should be held before procedure.  4. Name of physician performing surgery? Dr. [name]  5. What is your office phone and fax number? [phone] Phone-[phone]

## 2017-04-17 NOTE — Telephone Encounter (Signed)
New Message   Pt is calling requesting surgery clearance. I told him that we require the doctors office to call and request that information. He states medications need to be held for his eye surgery but refused to give me the name of the medications, the name of the doctor or office or even any other information. He states this is only for Dr. Antionette Char nurse. I am forwarding the message.

## 2017-04-18 NOTE — Telephone Encounter (Signed)
Telephone encounter faxed to [phone], Attn: Florentina Jenny.  I also left a voicemail for Adrian with Dr Antionette Char instructions in regards to clearance. Advised that they call the office with any additional questions.

## 2017-04-25 DIAGNOSIS — H268 Other specified cataract: Secondary | ICD-10-CM | POA: Diagnosis not present

## 2017-04-25 DIAGNOSIS — H21561 Pupillary abnormality, right eye: Secondary | ICD-10-CM | POA: Diagnosis not present

## 2017-04-25 DIAGNOSIS — H25811 Combined forms of age-related cataract, right eye: Secondary | ICD-10-CM | POA: Diagnosis not present

## 2017-04-25 DIAGNOSIS — H52201 Unspecified astigmatism, right eye: Secondary | ICD-10-CM | POA: Diagnosis not present

## 2017-05-08 ENCOUNTER — Other Ambulatory Visit: Payer: Self-pay | Admitting: Family Medicine

## 2017-05-19 ENCOUNTER — Ambulatory Visit (INDEPENDENT_AMBULATORY_CARE_PROVIDER_SITE_OTHER): Payer: Medicare Other | Admitting: General Practice

## 2017-05-19 DIAGNOSIS — I482 Chronic atrial fibrillation, unspecified: Secondary | ICD-10-CM

## 2017-05-19 DIAGNOSIS — Z952 Presence of prosthetic heart valve: Secondary | ICD-10-CM

## 2017-05-19 DIAGNOSIS — Z5181 Encounter for therapeutic drug level monitoring: Secondary | ICD-10-CM

## 2017-05-19 LAB — POCT INR: INR: 3.3

## 2017-05-19 NOTE — Patient Instructions (Signed)
Pre visit review using our clinic review tool, if applicable. No additional management support is needed unless otherwise documented below in the visit note. 

## 2017-06-16 ENCOUNTER — Ambulatory Visit (INDEPENDENT_AMBULATORY_CARE_PROVIDER_SITE_OTHER): Payer: Medicare Other | Admitting: General Practice

## 2017-06-16 DIAGNOSIS — I482 Chronic atrial fibrillation, unspecified: Secondary | ICD-10-CM

## 2017-06-16 DIAGNOSIS — Z5181 Encounter for therapeutic drug level monitoring: Secondary | ICD-10-CM | POA: Diagnosis not present

## 2017-06-16 DIAGNOSIS — Z952 Presence of prosthetic heart valve: Secondary | ICD-10-CM

## 2017-06-16 LAB — POCT INR: INR: 2.1

## 2017-06-16 NOTE — Patient Instructions (Signed)
Pre visit review using our clinic review tool, if applicable. No additional management support is needed unless otherwise documented below in the visit note. 

## 2017-06-23 DIAGNOSIS — L570 Actinic keratosis: Secondary | ICD-10-CM | POA: Diagnosis not present

## 2017-07-12 ENCOUNTER — Telehealth: Payer: Self-pay | Admitting: Cardiovascular Disease

## 2017-07-12 NOTE — Telephone Encounter (Signed)
New message    Pt states he is supposed to have an appointment in two months and hes not sure if hes supposed to have an echo or treadmill test. Requests a call back

## 2017-07-12 NOTE — Telephone Encounter (Signed)
Spoke with patient's wife and she said that patient wanted to know if he needed any testing done prior to his follow up appointment with Dr. [name].   After review of chart, wife advise that patient does not have any pending test orders and that when patient comes to the office for his follow up appointment that he would be evaluated by his provider and that it would be determined at that time if any testing is needed. Wife advised that patient is due for follow up with Dr. [name] in October 2018. Wife said that she would have the patient call office back to schedule this appointment.

## 2017-07-14 ENCOUNTER — Ambulatory Visit (INDEPENDENT_AMBULATORY_CARE_PROVIDER_SITE_OTHER): Payer: Medicare Other

## 2017-07-14 ENCOUNTER — Ambulatory Visit: Payer: Medicare Other

## 2017-07-14 DIAGNOSIS — Z952 Presence of prosthetic heart valve: Secondary | ICD-10-CM | POA: Diagnosis not present

## 2017-07-14 DIAGNOSIS — Z5181 Encounter for therapeutic drug level monitoring: Secondary | ICD-10-CM

## 2017-07-14 DIAGNOSIS — Z961 Presence of intraocular lens: Secondary | ICD-10-CM | POA: Diagnosis not present

## 2017-07-14 DIAGNOSIS — I482 Chronic atrial fibrillation, unspecified: Secondary | ICD-10-CM

## 2017-07-14 LAB — POCT INR: INR: 2.1

## 2017-07-14 NOTE — Patient Instructions (Signed)
Pre visit review using our clinic review tool, if applicable. No additional management support is needed unless otherwise documented below in the visit note. 

## 2017-07-14 NOTE — Progress Notes (Signed)
I have reviewed and agree with the plan. 

## 2017-08-03 ENCOUNTER — Ambulatory Visit (INDEPENDENT_AMBULATORY_CARE_PROVIDER_SITE_OTHER): Payer: Medicare Other | Admitting: Family Medicine

## 2017-08-03 ENCOUNTER — Telehealth: Payer: Self-pay

## 2017-08-03 ENCOUNTER — Encounter: Payer: Self-pay | Admitting: Family Medicine

## 2017-08-03 VITALS — BP 130/62 | HR 54 | Temp 98.2°F | Wt 131.0 lb

## 2017-08-03 DIAGNOSIS — S70361A Insect bite (nonvenomous), right thigh, initial encounter: Secondary | ICD-10-CM | POA: Diagnosis not present

## 2017-08-03 DIAGNOSIS — W57XXXA Bitten or stung by nonvenomous insect and other nonvenomous arthropods, initial encounter: Secondary | ICD-10-CM | POA: Diagnosis not present

## 2017-08-03 NOTE — Progress Notes (Signed)
Subjective:    Patient ID: Jesus [name], male    DOB: 12-18-1940, 76 y.o.   MRN: [id number]  Chief Complaint  Patient presents with  . Insect Bite    HPI Patient was seen today for acute visit.  Pt accompanied by his wife. Bite: -noticed a few days ago -pruritic, erythematous -Was given hydrocortisone cream by the Pharmacist, which helped some -Does not recall being bit.  Per wife, pt was doing some yard work, had a tree cut down, wooded area of property. 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 Aortic stenosis    mild with a mean aortic valve gradient of 12 mmHg  . Atrial fibrillation (HCC)    holding sinus rhythm on Amiodarone  . CAD (coronary artery disease)    a. S/P Ant MI 1980;  b. 1997 S/P CABG x 8 (LIMA to diag-LAD, SVG to OM1-OM2-OM3, SVG to [facility] - Dr [name]);  c. 01/2015 Cath: 3VD, 8/8 patent grafts.  . Cardiomyopathy   . Dysrhythmia   . Esophageal reflux   . GERD (gastroesophageal reflux disease)   . Headache   . History of colonoscopy   . History of transesophageal echocardiography (TEE) for monitoring   . Other and unspecified hyperlipidemia   . S/P TAVR (transcatheter aortic valve replacement)    a. 03/2015 26 mm [facility] Sapien 3 transcatheter heart valve placed via open left transfemoral approach.  . Skin lesions, generalized    facial which may represent actinic keratoses and possible photosensitivity from Amiodarone  . Unspecified essential hypertension     Past Surgical History:  Procedure Laterality Date  . CARDIAC CATHETERIZATION    . CARDIOVERSION  11/18/2006   Dr. [name]  . CATARACT EXTRACTION W/ INTRAOCULAR LENS IMPLANT  April '13  (Dr. [name])   left eye only  . CHOLECYSTECTOMY    . CORONARY ARTERY BYPASS GRAFT  02/09/1996   LIMA to diag-LAD, SVG to OM1-OM2-OM3, SVG to [facility]  . EYE SURGERY    . LEFT AND RIGHT HEART  CATHETERIZATION WITH CORONARY/GRAFT ANGIOGRAM N/A 01/26/2015   Procedure: LEFT AND RIGHT HEART CATHETERIZATION WITH [name];  Surgeon: [name], MD;  Location: [facility] CATH LAB;  Service: Cardiovascular;  Laterality: N/A;  . TEE WITHOUT CARDIOVERSION N/A 03/10/2015   Procedure: TRANSESOPHAGEAL ECHOCARDIOGRAM (TEE);  Surgeon: [name], MD;  Location: [facility];  Service: Open Heart Surgery;  Laterality: N/A;  . TONSILLECTOMY    . TRANSCATHETER AORTIC VALVE REPLACEMENT, TRANSFEMORAL N/A 03/10/2015   Procedure: TRANSCATHETER AORTIC VALVE REPLACEMENT, TRANSFEMORAL;  Surgeon: [name], MD;  Location: [place];  Service: Open Heart Surgery;  Laterality: N/A;    Family History  Problem Relation Age of Onset  . Stroke Mother 91       Deceased  . Crohn's disease Mother        Deceased  . Heart attack Father 76       Deceased  . Heart disease Father   . Heart failure Father        Deceased  . Atrial fibrillation Brother   . Heart disease Paternal Uncle   . Prostate cancer Paternal Uncle   . Colon cancer Neg Hx     Social History   Social History  . Marital status: Married    Spouse name: N/A  . Number of children: N/A  . Years of education: N/A   Occupational  History  . Not on file.   Social History Main Topics  . Smoking status: Never Smoker  . Smokeless tobacco: Never Used     Comment: Does not smoke.  [name] Alcohol use No  . Drug use: No  . Sexual activity: Not Currently   Other Topics Concern  . Not on file   Social History Narrative   HSG. [employer] 6 years. Married - '69 - 1 year/divorced. '76 - . No children. Work - mfg/textiles - Designer, multimedia; currently works doing maintenance. [employer] - they have discussed this. Provided packet august '13.                 Outpatient Medications Prior to Visit  Medication Sig Dispense Refill  . acetaminophen (TYLENOL) 500 MG tablet Take 500 mg by mouth every 6 (six) hours as needed for moderate  pain.    [name] amiodarone (PACERONE) 200 MG tablet TAKE 1/2 TABLET BY MOUTH ONCE DAILY. 45 tablet 3  . amLODipine (NORVASC) 5 MG tablet TAKE 1 TABLET BY MOUTH ONCE DAILY 90 tablet 3  . amoxicillin (AMOXIL) 500 MG capsule Take 2,000 mg by mouth as directed. TAKE FOUR CAPSULES ONE HOUR PRIOR TO DENTAL PROCEDURES 30 capsule 0  . Ascorbic Acid (VITAMIN C) 1000 MG tablet Take 1,000 mg by mouth daily.      [name] aspirin 81 MG tablet Take 81 mg by mouth daily.      [name] atorvastatin (LIPITOR) 80 MG tablet TAKE 1/2 TABLET BY MOUTH DAILY 45 tablet 5  . loratadine (CLARITIN) 10 MG tablet Take 10 mg by mouth daily as needed for allergies.     [name] losartan (COZAAR) 100 MG tablet TAKE 1 TABLET BY MOUTH ONCE DAILY. 90 tablet 2  . Multiple Vitamins-Minerals (MULTIVITAMIN,TX-MINERALS) tablet Take 1 tablet by mouth daily.      . pantoprazole (PROTONIX) 40 MG tablet TAKE 1 TABLET BY MOUTH ONCE DAILY 90 tablet 1  . ranitidine (ZANTAC) 75 MG tablet Take 75 mg by mouth 2 (two) times daily.    . traMADol (ULTRAM) 50 MG tablet Take 1-2 tablets (50-100 mg total) by mouth every 4 (four) hours as needed for moderate pain. 30 tablet 0  . warfarin (COUMADIN) 5 MG tablet TAKE AS DIRECTED BY COUMADIN CLINIC. 90 tablet 0   No facility-administered medications prior to visit.     Allergies  Allergen Reactions  . Codeine Other (See Comments)    Pt does not remember  . Iodine Swelling     ROS  General: Denies fever, chills, night sweats, changes in weight, changes in appetite HEENT: Denies headaches, ear pain, changes in vision, rhinorrhea, sore throat CV: Denies CP, palpitations, SOB, orthopnea Pulm: Denies SOB, cough, wheezing GI: Denies abdominal pain, nausea, vomiting, diarrhea, constipation GU: Denies dysuria, hematuria, frequency, vaginal discharge Msk: Denies muscle cramps, joint pains Neuro: Denies weakness, numbness, tingling Skin: Denies rashes, bruising  +pruritic bumps foot and leg Psych: Denies depression,  anxiety, hallucinations      Objective:    Blood pressure 130/62, pulse (!) 54, temperature 98.2 F (36.8 C), temperature source Oral, weight 131 lb (59.4 kg), SpO2 96 %.   Gen. Pleasant, well-nourished, in no distress, normal affect   HEENT: [place]/AT, face symmetric, conjunctiva clear, no scleral icterus, PERRLA, EOMI, nares patent without drainage, pharynx without erythema or exudate. Abdomen: soft and non-tender, no hepatosplenomegaly, BS normal. Musculoskeletal: No deformities, no cyanosis or clubbing, normal tone Neuro:  A&Ox3, CN II-XII intact, normal gait Skin:  Warm, dry, intact.  R foot with a small bump and 2 cm area of erythema-likely from shoe rubbing across the top of foot.   R inner thigh with 2 small erythematous areas with small punctate lesion.  R outer thigh/hip with 1 punctate lesions with erythema and small crust in center.   Wt Readings from Last 3 Encounters:  08/03/17 131 lb (59.4 kg)  03/21/17 131 lb 11.2 oz (59.7 kg)  03/20/17 131 lb 1.9 oz (59.5 kg)    Diabetic Foot Exam - Simple   No data filed     Lab Results  Component Value Date   WBC 7.8 03/12/2015   HGB 11.0 (L) 03/12/2015   HCT 33.8 (L) 03/12/2015   PLT 85 (L) 03/12/2015   GLUCOSE 114 (H) 03/20/2017   CHOL 134 09/12/2016   TRIG 119 09/12/2016   HDL 45 09/12/2016   LDLCALC 65 09/12/2016   ALT 20 03/20/2017   AST 20 03/20/2017   NA 142 03/20/2017   K 4.2 03/20/2017   CL 104 03/20/2017   CREATININE 0.94 03/20/2017   BUN 17 03/20/2017   CO2 22 03/20/2017   TSH 3.340 03/20/2017   PSA 1.25 08/27/2014   INR 2.1 07/14/2017   HGBA1C 6.0 (H) 03/06/2015    Assessment/Plan:  Insect bite, initial encounter -Discussed supportive treatment:  Continue cortisone cream, Benadryl cream, or baking soda/water paste. -Advised not to scratch -Ok to put Ice on areas -RTC precautions given, fever, increased edema or erythema, etc.    Pt on Amoxicillin 2000 mg po 1 hour prior to dental procedures.   Currently taking pills, but would like liquid.  Will have rx sent to pharmacy detailing the change.  Has dental appt Sept 19.

## 2017-08-03 NOTE — Patient Instructions (Addendum)
During today's visit we talked about using cortisone or Benadryl cream on your insect bites.  You can also use a paste made of baking soda and water on the bites to help with itching.  The important thing to remember is to keep the areas clean.  If you develop a fever, the areas of redness become larger, or you are not feeling well overall, please let the office know.    Insect Bite, Adult An insect bite can make your skin red, itchy, and swollen. Some insects can spread disease to people with a bite. However, most insect bites do not lead to disease, and most are not serious. Follow these instructions at home: Bite area care  Do not scratch the bite area.  Keep the bite area clean and dry.  Wash the bite area every day with soap and water as told by your doctor.  Check the bite area every day for signs of infection. Check for: ? More redness, swelling, or pain. ? Fluid or blood. ? Warmth. ? Pus. Managing pain, itching, and swelling  You may put any of these on the bite area as told by your doctor: ? A baking soda paste. ? Cortisone cream. ? Calamine lotion.  If directed, put ice on the bite area. ? Put ice in a plastic bag. ? Place a towel between your skin and the bag. ? Leave the ice on for 20 minutes, 2-3 times a day. Medicines  Take medicines or put medicines on your skin only as told by your doctor.  If you were prescribed an antibiotic medicine, use it as told by your doctor. Do not stop using the antibiotic even if your condition improves. General instructions  Keep all follow-up visits as told by your doctor. This is important. How is this prevented? To help you have a lower risk of insect bites:  When you are outside, wear clothing that covers your arms and legs.  Use insect repellent. The best insect repellents have: ? An active ingredient of DEET, picaridin, oil of lemon eucalyptus (OLE), or IR3535. ? Higher amounts of DEET or another active ingredient than  other repellents have.  If your home windows do not have screens, think about putting some in.  Contact a doctor if:  You have more redness, swelling, or pain in the bite area.  You have fluid, blood, or pus coming from the bite area.  The bite area feels warm.  You have a fever. Get help right away if:  You have joint pain.  You have a rash.  You have shortness of breath.  You feel more tired or sleepy than you normally do.  You have neck pain.  You have a headache.  You feel weaker than you normally do.  You have chest pain.  You have pain in your belly.  You feel sick to your stomach (nauseous) or you throw up (vomit). Summary  An insect bite can make your skin red, itchy, and swollen.  Do not scratch the bite area, and keep it clean and dry.  Ice can help with pain and itching from the bite. This information is not intended to replace advice given to you by your health care provider. Make sure you discuss any questions you have with your health care provider. Document Released: 11/18/2000 Document Revised: 06/23/2016 Document Reviewed: 04/08/2015 Elsevier Interactive Patient Education  2018 [employer].

## 2017-08-03 NOTE — Telephone Encounter (Signed)
Called pt pharmacy requested for them to wait before refilling the Amoxicillin 500 mg until a few days before  08/23/2017 per Dr Volanda Napoleon

## 2017-08-08 ENCOUNTER — Other Ambulatory Visit: Payer: Self-pay | Admitting: Family Medicine

## 2017-08-10 ENCOUNTER — Other Ambulatory Visit: Payer: Self-pay | Admitting: Family Medicine

## 2017-08-10 NOTE — Telephone Encounter (Signed)
Can use Amoxicillin 400 mg/73ml- take 5 teaspoons one hour prior to dental procedure (dose should be amoxicillin 2 gm one hour prior to procedure)

## 2017-08-10 NOTE — Telephone Encounter (Signed)
Pt called to ask for refill of amoxicillin (AMOXIL) 500 MG capsule Pt has appt with dentist on Monday.  Pt would like to switch this med to liquid because he has hard time with the capsules.  [place], Dora

## 2017-08-11 MED ORDER — AMOXICILLIN 400 MG/5ML PO SUSR: ORAL | 0 refills | Status: DC

## 2017-08-11 NOTE — Telephone Encounter (Signed)
rx sent and called in

## 2017-08-25 ENCOUNTER — Ambulatory Visit (INDEPENDENT_AMBULATORY_CARE_PROVIDER_SITE_OTHER): Payer: Medicare Other | Admitting: General Practice

## 2017-08-25 DIAGNOSIS — I482 Chronic atrial fibrillation, unspecified: Secondary | ICD-10-CM

## 2017-08-25 DIAGNOSIS — Z23 Encounter for immunization: Secondary | ICD-10-CM

## 2017-08-25 DIAGNOSIS — Z5181 Encounter for therapeutic drug level monitoring: Secondary | ICD-10-CM

## 2017-08-25 DIAGNOSIS — Z952 Presence of prosthetic heart valve: Secondary | ICD-10-CM

## 2017-08-25 DIAGNOSIS — Z7901 Long term (current) use of anticoagulants: Secondary | ICD-10-CM

## 2017-08-25 LAB — POCT INR: INR: 2.2

## 2017-08-25 NOTE — Patient Instructions (Signed)
Pre visit review using our clinic review tool, if applicable. No additional management support is needed unless otherwise documented below in the visit note. 

## 2017-08-25 NOTE — Progress Notes (Signed)
I have reviewed and agree with the plan. 

## 2017-09-19 ENCOUNTER — Ambulatory Visit: Payer: Medicare Other | Admitting: Family Medicine

## 2017-09-29 ENCOUNTER — Other Ambulatory Visit: Payer: Self-pay | Admitting: Family Medicine

## 2017-10-06 ENCOUNTER — Ambulatory Visit (INDEPENDENT_AMBULATORY_CARE_PROVIDER_SITE_OTHER): Payer: Medicare Other | Admitting: General Practice

## 2017-10-06 DIAGNOSIS — I4891 Unspecified atrial fibrillation: Secondary | ICD-10-CM

## 2017-10-06 DIAGNOSIS — Z7901 Long term (current) use of anticoagulants: Secondary | ICD-10-CM

## 2017-10-06 DIAGNOSIS — Z952 Presence of prosthetic heart valve: Secondary | ICD-10-CM

## 2017-10-06 LAB — POCT INR: INR: 2.4

## 2017-10-06 NOTE — Patient Instructions (Signed)
Pre visit review using our clinic review tool, if applicable. No additional management support is needed unless otherwise documented below in the visit note. 

## 2017-10-19 DIAGNOSIS — L570 Actinic keratosis: Secondary | ICD-10-CM | POA: Diagnosis not present

## 2017-10-23 ENCOUNTER — Ambulatory Visit: Payer: Medicare Other | Admitting: Cardiovascular Disease

## 2017-10-23 ENCOUNTER — Encounter: Payer: Self-pay | Admitting: Cardiovascular Disease

## 2017-10-23 VITALS — BP 142/58 | HR 60 | Ht 61.0 in | Wt 132.8 lb

## 2017-10-23 DIAGNOSIS — Z952 Presence of prosthetic heart valve: Secondary | ICD-10-CM

## 2017-10-23 DIAGNOSIS — I480 Paroxysmal atrial fibrillation: Secondary | ICD-10-CM | POA: Diagnosis not present

## 2017-10-23 NOTE — Patient Instructions (Signed)
Medication Instructions:  Your provider recommends that you continue on your current medications as directed. Please refer to the Current Medication list given to you today.    Labwork: Your provider recommends that you return for FASTING lab work in: 6 months.    Testing/Procedures: Dr. [name] recommends you have a CHEST XRAY in 6 months prior to your appointment with [facility].  Follow-Up: Your provider wants you to follow-up in: 6 months with Dr. Antionette Char assistant, Nicki Reaper. You will receive a reminder letter in the mail two months in advance. If you don't receive a letter, please call our office to schedule the follow-up appointment.    Your provider wants you to follow-up in: 1 year with Dr. [name]. You will receive a reminder letter in the mail two months in advance. If you don't receive a letter, please call our office to schedule the follow-up appointment.    Any Other Special Instructions Will Be Listed Below (If Applicable).     If you need a refill on your cardiac medications before your next appointment, please call your pharmacy.

## 2017-10-23 NOTE — Progress Notes (Signed)
Cardiology Office Note Date:  10/23/2017   ID:  Jesus [name], DOB 08/23/1941, MRN [id number]  PCP:  Jesus Post, MD  Cardiologist:  Janne Lab, MD    Chief Complaint  Patient presents with  . Follow-up    CAD/aortic valve disease     History of Present Illness: Jesus [name] is a 76 y.o. male who presents for follow-up of aortic valve disease, coronary artery disease, paroxysmal atrial fibrillation, and hypertension.  The patient had remote inferior MI in 1980. He underwent CABG in 1997. He developed progressive symptoms of severe aortic stenosis and underwent TAVR in April 2016via a transfemoral approach. He was treated with a 26 mm Sapien 3 transcatheter heart valve. Postop echo has shown normal transvalvular velocities and mild aortic insufficiency.  The patient is here with his wife today.  He is doing very well.  He denies chest pain or shortness of breath.  He is physically active with no exertional symptoms.  No leg swelling, heart palpitations, orthopnea, or PND.   Past Medical History:  Diagnosis Date  . Acute myocardial infarction of inferior wall ([place]) 1980  . Aortic stenosis    mild with a mean aortic valve gradient of 12 mmHg  . Atrial fibrillation (HCC)    holding sinus rhythm on Amiodarone  . CAD (coronary artery disease)    a. S/P Ant MI 1980;  b. 1997 S/P CABG x 8 (LIMA to diag-LAD, SVG to OM1-OM2-OM3, SVG to [facility] - Dr [name]);  c. 01/2015 Cath: 3VD, 8/8 patent grafts.  . Cardiomyopathy   . Dysrhythmia   . Esophageal reflux   . GERD (gastroesophageal reflux disease)   . Headache   . History of colonoscopy   . History of transesophageal echocardiography (TEE) for monitoring   . Other and unspecified hyperlipidemia   . S/P TAVR (transcatheter aortic valve replacement)    a. 03/2015 26 mm Edwards Sapien 3 transcatheter heart valve placed via open left transfemoral approach.  . Skin lesions, generalized    facial which may represent  actinic keratoses and possible photosensitivity from Amiodarone  . Unspecified essential hypertension     Past Surgical History:  Procedure Laterality Date  . CARDIAC CATHETERIZATION    . CARDIOVERSION  11/18/2006   Dr. [name]  . CATARACT EXTRACTION W/ INTRAOCULAR LENS IMPLANT  April '13  (Dr. [name])   left eye only  . CHOLECYSTECTOMY    . CORONARY ARTERY BYPASS GRAFT  02/09/1996   LIMA to diag-LAD, SVG to OM1-OM2-OM3, SVG to [facility]  . EYE SURGERY    . LEFT AND RIGHT HEART CATHETERIZATION WITH CORONARY/GRAFT ANGIOGRAM N/A 01/26/2015   Performed by [name], MD at [facility] CATH LAB  . TONSILLECTOMY    . TRANSCATHETER AORTIC VALVE REPLACEMENT, TRANSFEMORAL N/A 03/10/2015   Performed by [name], MD at [place]  . TRANSESOPHAGEAL ECHOCARDIOGRAM (TEE) N/A 03/10/2015   Performed by [name], MD at [facility] OR    Current Outpatient Medications  Medication Sig Dispense Refill  . acetaminophen (TYLENOL) 500 MG tablet Take 500 mg by mouth every 6 (six) hours as needed for moderate pain.    [name] amiodarone (PACERONE) 200 MG tablet TAKE 1/2 TABLET BY MOUTH ONCE DAILY. 45 tablet 3  . amLODipine (NORVASC) 5 MG tablet TAKE 1 TABLET BY MOUTH ONCE DAILY 90 tablet 3  . amoxicillin (AMOXIL) 400 MG/5ML suspension take 5 teaspoons one hour prior to dental procedure (dose should be amoxicillin 2 gm one hour prior to procedure)  500 mL 0  . Ascorbic Acid (VITAMIN C) 1000 MG tablet Take 1,000 mg by mouth daily.      [name] aspirin 81 MG tablet Take 81 mg by mouth daily.      [name] atorvastatin (LIPITOR) 80 MG tablet TAKE 1/2 TABLET BY MOUTH DAILY 45 tablet 5  . loratadine (CLARITIN) 10 MG tablet Take 10 mg by mouth daily as needed for allergies.     [name] losartan (COZAAR) 100 MG tablet TAKE 1 TABLET BY MOUTH ONCE DAILY 90 tablet 1  . Multiple Vitamins-Minerals (MULTIVITAMIN,TX-MINERALS) tablet Take 1 tablet by mouth daily.      . pantoprazole (PROTONIX) 40 MG tablet Take 40 mg every other day by mouth.      . ranitidine (ZANTAC) 75 MG tablet Take 75 mg every other day by mouth.    . traMADol (ULTRAM) 50 MG tablet Take 1-2 tablets (50-100 mg total) by mouth every 4 (four) hours as needed for moderate pain. 30 tablet 0  . warfarin (COUMADIN) 5 MG tablet TAKE AS DIRECTED BY COUMADIN CLINIC 90 tablet 1   No current facility-administered medications for this visit.     Allergies:   Codeine and Iodine   Social History:  The patient  reports that  has never smoked. he has never used smokeless tobacco. He reports that he does not drink alcohol or use drugs.   Family History:  The patient's family history includes Atrial fibrillation in his brother; Crohn's disease in his mother; Heart attack (age of onset: 12) in his father; Heart disease in his father and paternal uncle; Heart failure in his father; Prostate cancer in his paternal uncle; Stroke (age of onset: 26) in his mother.    ROS:  Please see the history of present illness.  All other systems are reviewed and negative.    PHYSICAL EXAM: VS:  BP (!) 142/58   Pulse 60   Ht 5\' 1"  (1.549 m)   Wt 132 lb 12.8 oz (60.2 kg)   BMI 25.09 kg/m  , BMI Body mass index is 25.09 kg/m. GEN: Well nourished, well developed, in no acute distress  HEENT: normal  Neck: no JVD, no masses. bilateral carotid bruits (transmitted murmur) Cardiac: RRR with 2/6 SEM at the RUSB Respiratory:  clear to auscultation bilaterally, normal work of breathing GI: soft, nontender, nondistended, + BS MS: no deformity or atrophy  Ext: no pretibial edema, pedal pulses 2+= bilaterally Skin: warm and dry, no rash Neuro:  Strength and sensation are intact Psych: euthymic mood, full affect  EKG:  EKG is ordered today. The ekg ordered today shows NSR 60 bpm, LVH with QRS widening, inferolateral infarct age-undetermined  Recent Labs: 03/20/2017: ALT 20; BUN 17; Creatinine, Ser 0.94; Potassium 4.2; Sodium 142; TSH 3.340   Lipid Panel     Component Value Date/Time   CHOL  134 09/12/2016 1311   TRIG 119 09/12/2016 1311   TRIG 67 11/20/2006 0748   HDL 45 09/12/2016 1311   CHOLHDL 3.0 09/12/2016 1311   VLDL 24 09/12/2016 1311   LDLCALC 65 09/12/2016 1311      Wt Readings from Last 3 Encounters:  10/23/17 132 lb 12.8 oz (60.2 kg)  08/03/17 131 lb (59.4 kg)  03/21/17 131 lb 11.2 oz (59.7 kg)     Cardiac Studies Reviewed: 03/14/2017: Study Conclusions  - Left ventricle: The cavity size was normal. Wall thickness was   increased in a pattern of mild LVH. The estimated ejection   fraction was 40%.  Global hypokinesis with regional variation   inferiorly. Doppler parameters are consistent with abnormal left   ventricular relaxation (grade 1 diastolic dysfunction). The E/e&'   ratio is >15, suggesting elevated LV filling pressure. Ejection   fraction (MOD, 2-plane): 40%. - Aortic valve: S/p 26 mm Sapien 3 TAVR. No paravalvular leak. No   obstruction. There was trivial regurgitation. - Mitral valve: Calcified annulus. Mildly thickened leaflets .   There was trivial regurgitation. - Left atrium: Severely dilated. - Right ventricle: The cavity size was mildly dilated. - Inferior vena cava: The vessel was normal in size. The   respirophasic diameter changes were in the normal range (= 50%),   consistent with normal central venous pressure.  Impressions:  - Compared to a prior study in 2017, there has been no change.  ASSESSMENT AND PLAN: 1.  Coronary artery disease, native vessel, without angina: The patient is stable on his current regimen.  These had a remote inferolateral MI with no recurrence of anginal symptoms in many years.  He has moderate residual LV systolic dysfunction and currently has no symptoms of heart failure.  2.  Aortic valve disease status [name] TAVR: Most recent echo reviewed as above.  Normal gradients and no significant paravalvular regurgitation.  3.  Paroxysmal atrial fibrillation: The patient has been on amiodarone now for  many years.  Will arrange a PA and lateral chest x-ray as well as laboratory testing to include thyroid studies and LFTs when he returns in 6 months.  4.  Hyperlipidemia: Treated with atorvastatin 40 mg daily.  Will update his lipid panel when he returns in 6 months. Current medicines are reviewed with the patient today.  The patient does not have concerns regarding medicines.  Labs/ tests ordered today include:  No orders of the defined types were placed in this encounter.   Disposition:   FU 6 months  Signed, Janne Lab, MD  10/23/2017 4:24 PM    [facility] [address], [place], [place]  [address] Phone: [phone]; Fax: [phone]

## 2017-10-30 ENCOUNTER — Ambulatory Visit (INDEPENDENT_AMBULATORY_CARE_PROVIDER_SITE_OTHER): Payer: Medicare Other | Admitting: Family Medicine

## 2017-10-30 ENCOUNTER — Encounter: Payer: Self-pay | Admitting: Family Medicine

## 2017-10-30 VITALS — BP 110/80 | HR 58 | Temp 98.2°F | Wt 133.0 lb

## 2017-10-30 DIAGNOSIS — E785 Hyperlipidemia, unspecified: Secondary | ICD-10-CM

## 2017-10-30 DIAGNOSIS — Z952 Presence of prosthetic heart valve: Secondary | ICD-10-CM | POA: Diagnosis not present

## 2017-10-30 DIAGNOSIS — I10 Essential (primary) hypertension: Secondary | ICD-10-CM

## 2017-10-30 NOTE — Progress Notes (Signed)
Subjective:     Patient ID: Jesus [name], male   DOB: 1941/10/16, 76 y.o.   MRN: [id number]  HPI Patient seen for medical follow-up. His chronic problem subcutaneous history of aortic stenosis, history of aortic valve replacement, CAD, history of atrial fibrillation, history of ischemic cardiopathy, GERD, hyperlipidemia. He remains on Coumadin and is followed to the Coumadin clinic.  Generally doing well. No recent falls. Has had some stiffness involving his thighs and buttock region when first gets up to ambulate but no pain with ambulation after moving. Denies any low back pain. No recent chest pain. Medications reviewed. Compliant with all.  Recently saw cardiology. No med changes made. Already had flu vaccine this year.  Past Medical History:  Diagnosis Date  . Acute myocardial infarction of inferior wall ([place]) 1980  . Aortic stenosis    mild with a mean aortic valve gradient of 12 mmHg  . Atrial fibrillation (HCC)    holding sinus rhythm on Amiodarone  . CAD (coronary artery disease)    a. S/P Ant MI 1980;  b. 1997 S/P CABG x 8 (LIMA to diag-LAD, SVG to OM1-OM2-OM3, SVG to [facility] - Dr [name]);  c. 01/2015 Cath: 3VD, 8/8 patent grafts.  . Cardiomyopathy   . Dysrhythmia   . Esophageal reflux   . GERD (gastroesophageal reflux disease)   . Headache   . History of colonoscopy   . History of transesophageal echocardiography (TEE) for monitoring   . Other and unspecified hyperlipidemia   . S/P TAVR (transcatheter aortic valve replacement)    a. 03/2015 26 mm Edwards Sapien 3 transcatheter heart valve placed via open left transfemoral approach.  . Skin lesions, generalized    facial which may represent actinic keratoses and possible photosensitivity from Amiodarone  . Unspecified essential hypertension    Past Surgical History:  Procedure Laterality Date  . CARDIAC CATHETERIZATION    . CARDIOVERSION  11/18/2006   Dr. [name]  . CATARACT EXTRACTION W/ INTRAOCULAR LENS IMPLANT   April '13  (Dr. [name])   left eye only  . CHOLECYSTECTOMY    . CORONARY ARTERY BYPASS GRAFT  02/09/1996   LIMA to diag-LAD, SVG to OM1-OM2-OM3, SVG to [facility]  . EYE SURGERY    . LEFT AND RIGHT HEART CATHETERIZATION WITH CORONARY/GRAFT ANGIOGRAM N/A 01/26/2015   Procedure: LEFT AND RIGHT HEART CATHETERIZATION WITH [name];  Surgeon: [name], MD;  Location: [facility] CATH LAB;  Service: Cardiovascular;  Laterality: N/A;  . TEE WITHOUT CARDIOVERSION N/A 03/10/2015   Procedure: TRANSESOPHAGEAL ECHOCARDIOGRAM (TEE);  Surgeon: [name], MD;  Location: [place];  Service: Open Heart Surgery;  Laterality: N/A;  . TONSILLECTOMY    . TRANSCATHETER AORTIC VALVE REPLACEMENT, TRANSFEMORAL N/A 03/10/2015   Procedure: TRANSCATHETER AORTIC VALVE REPLACEMENT, TRANSFEMORAL;  Surgeon: [name], MD;  Location: [place];  Service: Open Heart Surgery;  Laterality: N/A;    reports that  has never smoked. he has never used smokeless tobacco. He reports that he does not drink alcohol or use drugs. family history includes Atrial fibrillation in his brother; Crohn's disease in his mother; Heart attack (age of onset: 72) in his father; Heart disease in his father and paternal uncle; Heart failure in his father; Prostate cancer in his paternal uncle; Stroke (age of onset: 83) in his mother. Allergies  Allergen Reactions  . Codeine Other (See Comments)    Pt does not remember  . Iodine Swelling     Review of Systems  Constitutional: Negative for fatigue and  unexpected weight change.  Eyes: Negative for visual disturbance.  Respiratory: Negative for cough, chest tightness and shortness of breath.   Cardiovascular: Negative for chest pain, palpitations and leg swelling.  Gastrointestinal: Negative for abdominal pain.  Endocrine: Negative for polydipsia and polyuria.  Neurological: Negative for dizziness, syncope, weakness, light-headedness and headaches.       Objective:   Physical  Exam  Constitutional: He is oriented to person, place, and time. He appears well-developed and well-nourished.  Neck: Neck supple.  Cardiovascular: Normal rate and regular rhythm.  Murmur heard. Pulmonary/Chest: Effort normal and breath sounds normal. No respiratory distress. He has no wheezes. He has no rales.  Musculoskeletal: He exhibits no edema.  Neurological: He is alert and oriented to person, place, and time.       Assessment:     #1 history of hypertension stable and at goal  #2 dyslipidemia  #3 history of aortic stenosis with previous aortic valve replacement doing well and on chronic Coumadin    Plan:     -Flu vaccine already given -Patient has already had labs placed for comprehensive metabolic panel, lipid panel, TSH per cardiology and he will schedule those by springtime -Routine follow-up in 6 months and sooner as needed  [name] MD [facility] at [facility]

## 2017-11-13 ENCOUNTER — Ambulatory Visit: Payer: Medicare Other

## 2017-11-20 ENCOUNTER — Ambulatory Visit (INDEPENDENT_AMBULATORY_CARE_PROVIDER_SITE_OTHER): Payer: Medicare Other | Admitting: General Practice

## 2017-11-20 DIAGNOSIS — I482 Chronic atrial fibrillation, unspecified: Secondary | ICD-10-CM

## 2017-11-20 DIAGNOSIS — Z7901 Long term (current) use of anticoagulants: Secondary | ICD-10-CM | POA: Diagnosis not present

## 2017-11-20 DIAGNOSIS — Z952 Presence of prosthetic heart valve: Secondary | ICD-10-CM

## 2017-11-20 LAB — POCT INR: INR: 2.4

## 2017-11-20 NOTE — Patient Instructions (Signed)
Pre visit review using our clinic review tool, if applicable. No additional management support is needed unless otherwise documented below in the visit note.  Continue to take 1 tablet daily except 1/2 tablet on Mon/Wed/Friday.  Re-check in 6 weeks.    

## 2017-12-25 ENCOUNTER — Other Ambulatory Visit: Payer: Self-pay | Admitting: Family Medicine

## 2018-01-02 ENCOUNTER — Ambulatory Visit (INDEPENDENT_AMBULATORY_CARE_PROVIDER_SITE_OTHER): Payer: Medicare Other | Admitting: General Practice

## 2018-01-02 DIAGNOSIS — Z952 Presence of prosthetic heart valve: Secondary | ICD-10-CM

## 2018-01-02 DIAGNOSIS — I482 Chronic atrial fibrillation, unspecified: Secondary | ICD-10-CM

## 2018-01-02 DIAGNOSIS — Z7901 Long term (current) use of anticoagulants: Secondary | ICD-10-CM | POA: Diagnosis not present

## 2018-01-02 LAB — POCT INR: INR: 2.1

## 2018-01-02 NOTE — Patient Instructions (Addendum)
Pre visit review using our clinic review tool, if applicable. No additional management support is needed unless otherwise documented below in the visit note.  Continue to take 1 tablet daily except 1/2 tablet on Mon/Wed/Friday.  Re-check in 6 weeks.    

## 2018-02-09 ENCOUNTER — Telehealth: Payer: Self-pay | Admitting: Family Medicine

## 2018-02-09 MED ORDER — AMOXICILLIN 400 MG/5ML PO SUSR: ORAL | 0 refills | Status: DC

## 2018-02-09 NOTE — Telephone Encounter (Signed)
Refill OK

## 2018-02-09 NOTE — Telephone Encounter (Signed)
Copied from [place] [id number]. Topic: General - Other >> Feb 09, 2018  1:14 PM Carolyn Stare wrote:  Pt cal lto say Dr Elease Hashimoto told him any time he was having some dental work done that he would call him in the below med   amoxicillin (AMOXIL) 400 MG/5ML suspension    pt has an appt on 02/19/18   [place]

## 2018-02-13 ENCOUNTER — Ambulatory Visit (INDEPENDENT_AMBULATORY_CARE_PROVIDER_SITE_OTHER): Payer: Medicare Other | Admitting: General Practice

## 2018-02-13 DIAGNOSIS — Z952 Presence of prosthetic heart valve: Secondary | ICD-10-CM | POA: Diagnosis not present

## 2018-02-13 DIAGNOSIS — I482 Chronic atrial fibrillation, unspecified: Secondary | ICD-10-CM

## 2018-02-13 DIAGNOSIS — Z7901 Long term (current) use of anticoagulants: Secondary | ICD-10-CM | POA: Diagnosis not present

## 2018-02-13 LAB — POCT INR: INR: 2.3

## 2018-02-13 NOTE — Patient Instructions (Addendum)
Pre visit review using our clinic review tool, if applicable. No additional management support is needed unless otherwise documented below in the visit note.  Continue to take 1 tablet daily except 1/2 tablet on Mon/Wed/Friday.  Re-check in 6 weeks.    

## 2018-02-14 ENCOUNTER — Telehealth: Payer: Self-pay

## 2018-02-14 NOTE — Telephone Encounter (Signed)
-----   Message from Theodoro Parma, RN sent at 10/23/2017  4:57 PM EST ----- Regarding: 6 mo SW with labs/xray prior due May 2019 Orders in Epic 6 mo from 10/23/2018

## 2018-02-14 NOTE — Telephone Encounter (Signed)
Scheduled patient for 6 month OV with [name] 5/13. Scheduled patient for fasting lab work 5/9. He understands to proceed to [place] after labs are drawn to have chest xray. He understands Nicki Reaper will review results with him at his visit. He was grateful for call and agrees with treatment plan.

## 2018-03-13 ENCOUNTER — Ambulatory Visit: Payer: Medicare Other | Admitting: Physician Assistant

## 2018-03-24 ENCOUNTER — Other Ambulatory Visit: Payer: Self-pay | Admitting: Cardiovascular Disease

## 2018-03-24 ENCOUNTER — Other Ambulatory Visit: Payer: Self-pay | Admitting: Family Medicine

## 2018-03-27 ENCOUNTER — Ambulatory Visit (INDEPENDENT_AMBULATORY_CARE_PROVIDER_SITE_OTHER): Payer: Medicare Other | Admitting: General Practice

## 2018-03-27 DIAGNOSIS — I482 Chronic atrial fibrillation, unspecified: Secondary | ICD-10-CM

## 2018-03-27 DIAGNOSIS — Z952 Presence of prosthetic heart valve: Secondary | ICD-10-CM | POA: Diagnosis not present

## 2018-03-27 DIAGNOSIS — Z7901 Long term (current) use of anticoagulants: Secondary | ICD-10-CM

## 2018-03-27 LAB — POCT INR: INR: 2.3

## 2018-03-27 NOTE — Patient Instructions (Addendum)
Pre visit review using our clinic review tool, if applicable. No additional management support is needed unless otherwise documented below in the visit note.  Continue to take 1 tablet daily except 1/2 tablet on Mon/Wed/Friday.  Re-check in 6 weeks.    

## 2018-03-29 ENCOUNTER — Encounter: Payer: Self-pay | Admitting: Physician Assistant

## 2018-04-03 ENCOUNTER — Ambulatory Visit: Payer: Medicare Other

## 2018-04-12 ENCOUNTER — Ambulatory Visit
Admission: RE | Admit: 2018-04-12 | Discharge: 2018-04-12 | Disposition: A | Discharge status: Home / Self Care | Payer: Medicare Other | Source: Ambulatory Visit | Attending: Cardiovascular Disease | Admitting: Cardiovascular Disease

## 2018-04-12 ENCOUNTER — Other Ambulatory Visit: Payer: Medicare Other

## 2018-04-12 DIAGNOSIS — Z952 Presence of prosthetic heart valve: Secondary | ICD-10-CM

## 2018-04-12 LAB — COMPREHENSIVE METABOLIC PANEL
ALT: 21 IU/L (ref 0–44)
AST: 24 IU/L (ref 0–40)
Albumin/Globulin Ratio: 1.9 (ref 1.2–2.2)
Albumin: 4.1 g/dL (ref 3.5–4.8)
Alkaline Phosphatase: 118 IU/L — ABNORMAL HIGH (ref 39–117)
BUN/Creatinine Ratio: 16 (ref 10–24)
BUN: 15 mg/dL (ref 8–27)
Bilirubin Total: 0.7 mg/dL (ref 0.0–1.2)
CALCIUM: 9.2 mg/dL (ref 8.6–10.2)
CO2: 23 mmol/L (ref 20–29)
Chloride: 103 mmol/L (ref 96–106)
Creatinine, Ser: 0.92 mg/dL (ref 0.76–1.27)
GFR calc Af Amer: 91 mL/min/{1.73_m2} (ref >59)
GFR, EST NON AFRICAN AMERICAN: 79 mL/min/{1.73_m2} (ref >59)
GLOBULIN, TOTAL: 2.2 g/dL (ref 1.5–4.5)
Glucose: 96 mg/dL (ref 65–99)
Potassium: 4.6 mmol/L (ref 3.5–5.2)
SODIUM: 138 mmol/L (ref 134–144)
Total Protein: 6.3 g/dL (ref 6.0–8.5)

## 2018-04-12 LAB — LIPID PANEL
CHOL/HDL RATIO: 2.9 ratio (ref 0.0–5.0)
Cholesterol, Total: 141 mg/dL (ref 100–199)
HDL: 49 mg/dL (ref >39)
LDL Calculated: 70 mg/dL (ref 0–99)
TRIGLYCERIDES: 109 mg/dL (ref 0–149)
VLDL Cholesterol Cal: 22 mg/dL (ref 5–40)

## 2018-04-12 LAB — TSH: TSH: 3.73 u[IU]/mL (ref 0.450–4.500)

## 2018-04-16 ENCOUNTER — Encounter: Payer: Self-pay | Admitting: Physician Assistant

## 2018-04-16 ENCOUNTER — Ambulatory Visit: Payer: Medicare Other | Admitting: Physician Assistant

## 2018-04-16 VITALS — BP 118/60 | HR 60 | Ht 61.0 in | Wt 132.6 lb

## 2018-04-16 DIAGNOSIS — I481 Persistent atrial fibrillation: Secondary | ICD-10-CM

## 2018-04-16 DIAGNOSIS — I2510 Atherosclerotic heart disease of native coronary artery without angina pectoris: Secondary | ICD-10-CM

## 2018-04-16 DIAGNOSIS — Z952 Presence of prosthetic heart valve: Secondary | ICD-10-CM | POA: Diagnosis not present

## 2018-04-16 DIAGNOSIS — I4819 Other persistent atrial fibrillation: Secondary | ICD-10-CM

## 2018-04-16 DIAGNOSIS — I5042 Chronic combined systolic (congestive) and diastolic (congestive) heart failure: Secondary | ICD-10-CM | POA: Diagnosis not present

## 2018-04-16 DIAGNOSIS — E785 Hyperlipidemia, unspecified: Secondary | ICD-10-CM | POA: Diagnosis not present

## 2018-04-16 DIAGNOSIS — I5022 Chronic systolic (congestive) heart failure: Secondary | ICD-10-CM | POA: Insufficient documentation

## 2018-04-16 DIAGNOSIS — I10 Essential (primary) hypertension: Secondary | ICD-10-CM

## 2018-04-16 NOTE — Progress Notes (Signed)
Cardiology Office Note:    Date:  04/16/2018   ID:  Jesus [name], DOB 1941-11-29, MRN [id number]  PCP:  Jesus Post, MD  Cardiologist:  Jesus Mocha, MD   Referring MD: Jesus Post, MD   Chief Complaint  Patient presents with  . Follow-up    CAD, CHF, s/p TAVR    History of Present Illness:    Jesus [name] is a 77 y.o. male with coronary artery disease status [name] inferior MI in 1980, status [name] CABG in 1997, aortic stenosis status [name] TAVR in April 3710, combined systolic and diastolic heart failure, persistent atrial fibrillation, long-term amiodarone therapy.  Last seen by Dr. Burt [name] November 2018.  [name] returns for follow-up.  Since last seen, he has done well.  He continues to exercise.  He denies shortness of breath, chest discomfort, dizziness or syncope, PND or edema.  Prior CV studies:   The following studies were reviewed today:  Echo 03/14/2017 Mild LVH, EF 62%, grade 1 diastolic dysfunction, status [name] TAVR, no paravalvular leak, trivial AI, MAC, trivial MR, severe LAE, mild RVE  Echo 03/11/2016 Mild LVH, EF 35-40, inferior akinesis, grade 1 diastolic dysfunction, status [name] TAVR with peak and mean gradients 31 and 14, mild AI, severe LAE  Cardiac catheterization 01/26/2015 LM 99 LAD proximal 100 LCx 100 RCA 100 LIMA-LAD patent SVG-OM1/OM2/OM3 patent with OM3 distal 80 after insertion of SVG SVG-AM/PDA patent   Past Medical History:  Diagnosis Date  . Acute myocardial infarction of inferior wall ([place]) 1980  . Aortic stenosis    mild with a mean aortic valve gradient of 12 mmHg  . Atrial fibrillation (HCC)    holding sinus rhythm on Amiodarone  . CAD (coronary artery disease)    a. S/P Ant MI 1980;  b. 1997 S/P CABG x 8 (LIMA to diag-LAD, SVG to OM1-OM2-OM3, SVG to [facility] - Dr [name]);  c. 01/2015 Cath: 3VD, 8/8 patent grafts.  . Cardiomyopathy   . Dysrhythmia   . Esophageal reflux   . GERD (gastroesophageal reflux  disease)   . Headache   . History of colonoscopy   . History of transesophageal echocardiography (TEE) for monitoring   . Other and unspecified hyperlipidemia   . S/P TAVR (transcatheter aortic valve replacement)    a. 03/2015 26 mm Edwards Sapien 3 transcatheter heart valve placed via open left transfemoral approach.  . Skin lesions, generalized    facial which [name] represent actinic keratoses and possible photosensitivity from Amiodarone  . Unspecified essential hypertension    Surgical Hx: The patient  has a past surgical history that includes Tonsillectomy; Coronary artery bypass graft (02/09/1996); Cholecystectomy; Cardioversion (11/18/2006); Cataract extraction w/ intraocular lens implant (April '13  (Dr. [name])); left and right heart catheterization with coronary/graft angiogram (N/A, 01/26/2015); Cardiac catheterization; Eye surgery; Transcatheter aortic valve replacement, transfemoral (N/A, 03/10/2015); and TEE without cardioversion (N/A, 03/10/2015).   Current Medications: Current Meds  Medication Sig  . acetaminophen (TYLENOL) 500 MG tablet Take 500 mg by mouth every 6 (six) hours as needed for moderate pain.  [name] amiodarone (PACERONE) 200 MG tablet Take 0.5 tablets (100 mg total) by mouth daily.  [name] amLODipine (NORVASC) 5 MG tablet Take 1 tablet (5 mg total) by mouth daily.  [name] amoxicillin (AMOXIL) 400 MG/5ML suspension take 5 teaspoons one hour prior to dental procedure (dose should be amoxicillin 2 gm one hour prior to procedure)  . Ascorbic Acid (VITAMIN C) 1000 MG tablet Take 1,000 mg by mouth daily.    [name]  aspirin 81 MG tablet Take 81 mg by mouth daily.    [name] atorvastatin (LIPITOR) 80 MG tablet TAKE 1/2 TABLET BY MOUTH DAILY  . loratadine (CLARITIN) 10 MG tablet Take 10 mg by mouth daily as needed for allergies.   [name] losartan (COZAAR) 100 MG tablet TAKE 1 TABLET BY MOUTH ONCE DAILY  . Multiple Vitamins-Minerals (MULTIVITAMIN,TX-MINERALS) tablet Take 1 tablet by mouth daily.    .  pantoprazole (PROTONIX) 40 MG tablet Take 40 mg every other day by mouth.  . pantoprazole (PROTONIX) 40 MG tablet TAKE 1 TABLET BY MOUTH ONCE DAILY  . ranitidine (ZANTAC) 75 MG tablet Take 75 mg every other day by mouth.  . traMADol (ULTRAM) 50 MG tablet Take 1-2 tablets (50-100 mg total) by mouth every 4 (four) hours as needed for moderate pain.  [name] warfarin (COUMADIN) 5 MG tablet TAKE AS DIRECTED BY COUMADIN CLINIC     Allergies:   Codeine and Iodine   Social History   Tobacco Use  . Smoking status: Never Smoker  . Smokeless tobacco: Never Used  . Tobacco comment: Does not smoke.  Substance Use Topics  . Alcohol use: No    Alcohol/week: 0.0 oz  . Drug use: No     Family Hx: The patient's family history includes Atrial fibrillation in his brother; Crohn's disease in his mother; Heart attack (age of onset: 88) in his father; Heart disease in his father and paternal uncle; Heart failure in his father; Prostate cancer in his paternal uncle; Stroke (age of onset: 73) in his mother. There is no history of Colon cancer.  ROS:   Please see the history of present illness.    ROS All other systems reviewed and are negative.   EKGs/Labs/Other Test Reviewed:    EKG:  EKG is  ordered today.  The ekg ordered today demonstrates sinus bradycardia, heart rate 59, normal axis, QTC 445, inferior Q waves, similar to prior tracings  Recent Labs: 04/12/2018: ALT 21; BUN 15; Creatinine, Ser 0.92; Potassium 4.6; Sodium 138; TSH 3.730   Recent Lipid Panel Lab Results  Component Value Date/Time   CHOL 141 04/12/2018 08:04 AM   TRIG 109 04/12/2018 08:04 AM   TRIG 67 11/20/2006 07:48 AM   HDL 49 04/12/2018 08:04 AM   CHOLHDL 2.9 04/12/2018 08:04 AM   CHOLHDL 3.0 09/12/2016 01:11 PM   LDLCALC 70 04/12/2018 08:04 AM    Physical Exam:    VS:  BP 118/60 (BP Location: Right Arm, Patient Position: Sitting, Cuff Size: Normal)   Pulse 60   Ht _0  (1.549 m)   Wt 132 lb 9.6 oz (60.1 kg)   SpO2 95%    BMI 25.05 kg/m     Wt Readings from Last 3 Encounters:  04/16/18 132 lb 9.6 oz (60.1 kg)  10/30/17 133 lb (60.3 kg)  10/23/17 132 lb 12.8 oz (60.2 kg)     Physical Exam  Constitutional: He is oriented to person, place, and time. He appears well-developed and well-nourished. No distress.  HENT:  Head: Normocephalic and atraumatic.  Neck: Neck supple. No JVD present.  Cardiovascular: Normal rate, regular rhythm, S1 normal and S2 normal.  Murmur heard.  Low-pitched systolic murmur is present with a grade of 2/6 at the upper right sternal border.  Diastolic murmur is present with a grade of 2/6 at the lower left sternal border. Pulmonary/Chest: Breath sounds normal. He has no rales.  Abdominal: Soft. There is no hepatomegaly.  Musculoskeletal: He exhibits no edema.  Neurological: He is alert and oriented to person, place, and time.  Skin: Skin is warm and dry.    ASSESSMENT & PLAN:    Chronic combined systolic and diastolic CHF (congestive heart failure) (HCC) EF 40% by echocardiogram April 2018 with mild diastolic dysfunction.  He remains NYHA 2.  Volume status appears stable.  He is not on diuretic therapy.  He remains on chronic amiodarone.  His heart rate is too slow to tolerate the addition of beta-blocker therapy.  Continue ARB.  Coronary artery disease involving native coronary artery of native heart without angina pectoris Remote myocardial infarction in 1980 and CABG in 1997.  Cardiac catheterization prior to his TAVR in 2016 demonstrated patent bypass grafts.  He denies anginal symptoms.  Continue aspirin, statin.  S/P TAVR (transcatheter aortic valve replacement)  Well-functioning aortic valve prosthesis by echocardiogram April 2018.  Continue SBE prophylaxis.  Persistent atrial fibrillation (HCC) Maintaining normal sinus rhythm.  He is on long-term amiodarone therapy.  Recent chest x-ray was unremarkable.  Recent LFTs and TSH were also normal.  He is tolerating  anticoagulation with Coumadin.  This is followed by primary care.  Continue current therapy.  Essential hypertension The patient's blood pressure is controlled on his current regimen.  Continue current therapy.   Hyperlipidemia, unspecified hyperlipidemia type LDL optimal on most recent lab work.  Continue current Rx.     Dispo:  Return in about 6 months (around 10/17/2018) for Routine Follow Up, w/ Dr. Burt [name].   Medication Adjustments/Labs and Tests Ordered: Current medicines are reviewed at length with the patient today.  Concerns regarding medicines are outlined above.  Tests Ordered: Orders Placed This Encounter  Procedures  . EKG 12-Lead   Medication Changes: No orders of the defined types were placed in this encounter.   Signed, [name], PA-C  04/16/2018 1:23 PM    [facility] [address], [place], [place]  [address] Phone: [phone]; Fax: [phone]

## 2018-04-16 NOTE — Patient Instructions (Signed)
Medication Instructions:  1. Your physician recommends that you continue on your current medications as directed. Please refer to the Current Medication list given to you today.   Labwork: NONE ORDERED TODAY  Testing/Procedures: NONE ORDERED TODAY  Follow-Up: Your physician wants you to follow-up in: 6 MONTHS WITH DR. COOPER  You will receive a reminder letter in the mail two months in advance. If you don't receive a letter, please call our office to schedule the follow-up appointment.   Any Other Special Instructions Will Be Listed Below (If Applicable).     If you need a refill on your cardiac medications before your next appointment, please call your pharmacy.   

## 2018-04-18 ENCOUNTER — Encounter: Payer: Self-pay | Admitting: Family Medicine

## 2018-04-18 ENCOUNTER — Ambulatory Visit (INDEPENDENT_AMBULATORY_CARE_PROVIDER_SITE_OTHER): Payer: Medicare Other | Admitting: Family Medicine

## 2018-04-18 VITALS — BP 110/58 | HR 57 | Temp 98.4°F | Wt 131.8 lb

## 2018-04-18 DIAGNOSIS — I10 Essential (primary) hypertension: Secondary | ICD-10-CM

## 2018-04-18 DIAGNOSIS — I482 Chronic atrial fibrillation, unspecified: Secondary | ICD-10-CM

## 2018-04-18 DIAGNOSIS — K219 Gastro-esophageal reflux disease without esophagitis: Secondary | ICD-10-CM

## 2018-04-18 DIAGNOSIS — Z7901 Long term (current) use of anticoagulants: Secondary | ICD-10-CM

## 2018-04-18 DIAGNOSIS — Z952 Presence of prosthetic heart valve: Secondary | ICD-10-CM

## 2018-04-18 NOTE — Progress Notes (Signed)
Subjective:     Patient ID: Jesus [name], male   DOB: [birth year], 77 y.o.   MRN: [id number]  HPI Patient for medical follow-up. He has history of atrial fibrillation (on Coumadin), history of aortic valve replacement, CAD, hypertension, ischemic cardiomyopathy. Doing very well. He had recent lab work per cardiology and labs were unremarkable. He denies any dyspnea other than with things like hill climbing. No chest pains.  Appetite and weight are stable.  Past Medical History:  Diagnosis Date  . 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 Aortic stenosis    mild with a mean aortic valve gradient of 12 mmHg  . Atrial fibrillation (HCC)    holding sinus rhythm on Amiodarone  . CAD (coronary artery disease)    a. S/P Ant MI 1980;  b. 1997 S/P CABG x 8 (LIMA to diag-LAD, SVG to OM1-OM2-OM3, SVG to [facility] - Dr [name]);  c. 01/2015 Cath: 3VD, 8/8 patent grafts.  . Cardiomyopathy   . Dysrhythmia   . Esophageal reflux   . GERD (gastroesophageal reflux disease)   . Headache   . History of colonoscopy   . History of transesophageal echocardiography (TEE) for monitoring   . Other and unspecified hyperlipidemia   . S/P TAVR (transcatheter aortic valve replacement)    a. 03/2015 26 mm Edwards Sapien 3 transcatheter heart valve placed via open left transfemoral approach.  . Skin lesions, generalized    facial which may represent actinic keratoses and possible photosensitivity from Amiodarone  . Unspecified essential hypertension    Past Surgical History:  Procedure Laterality Date  . CARDIAC CATHETERIZATION    . CARDIOVERSION  11/18/2006   Dr. [name]  . CATARACT EXTRACTION W/ INTRAOCULAR LENS IMPLANT  April '13  (Dr. [name])   left eye only  . CHOLECYSTECTOMY    . CORONARY ARTERY BYPASS GRAFT  02/09/1996   LIMA to diag-LAD, SVG to OM1-OM2-OM3, SVG to [facility]  . EYE SURGERY    . LEFT AND RIGHT HEART CATHETERIZATION WITH CORONARY/GRAFT ANGIOGRAM N/A 01/26/2015    Procedure: LEFT AND RIGHT HEART CATHETERIZATION WITH [name];  Surgeon: [name], MD;  Location: [facility] CATH LAB;  Service: Cardiovascular;  Laterality: N/A;  . TEE WITHOUT CARDIOVERSION N/A 03/10/2015   Procedure: TRANSESOPHAGEAL ECHOCARDIOGRAM (TEE);  Surgeon: [name], MD;  Location: [place];  Service: Open Heart Surgery;  Laterality: N/A;  . TONSILLECTOMY    . TRANSCATHETER AORTIC VALVE REPLACEMENT, TRANSFEMORAL N/A 03/10/2015   Procedure: TRANSCATHETER AORTIC VALVE REPLACEMENT, TRANSFEMORAL;  Surgeon: [name], MD;  Location: [place];  Service: Open Heart Surgery;  Laterality: N/A;    reports that he has never smoked. He has never used smokeless tobacco. He reports that he does not drink alcohol or use drugs. family history includes Atrial fibrillation in his brother; Crohn's disease in his mother; Heart attack (age of onset: 47) in his father; Heart disease in his father and paternal uncle; Heart failure in his father; Prostate cancer in his paternal uncle; Stroke (age of onset: 64) in his mother. Allergies  Allergen Reactions  . Codeine Other (See Comments)    Pt does not remember  . Iodine Swelling     Review of Systems  Constitutional: Negative for fatigue.  Eyes: Negative for visual disturbance.  Respiratory: Negative for cough, chest tightness and shortness of breath.   Cardiovascular: Negative for chest pain, palpitations and leg swelling.  Gastrointestinal: Negative for abdominal pain.  Neurological: Negative for dizziness, syncope, weakness, light-headedness and headaches.  Hematological: Does not bruise/bleed easily.  Psychiatric/Behavioral: Negative for dysphoric mood.       Objective:   Physical Exam  Constitutional: He is oriented to person, place, and time. He appears well-developed and well-nourished.  HENT:  Right Ear: External ear normal.  Left Ear: External ear normal.  Mouth/Throat: Oropharynx is clear and moist.  Eyes: Pupils  are equal, round, and reactive to light.  Neck: Neck supple. No thyromegaly present.  Cardiovascular: Normal rate.  Murmur heard. Pulmonary/Chest: Effort normal and breath sounds normal. No respiratory distress. He has no wheezes. He has no rales.  Musculoskeletal: He exhibits no edema.  Neurological: He is alert and oriented to person, place, and time.       Assessment:     #1 history of aortic valve replacement- no recent dyspnea or CP.    #2 hypertension stable and at goal  #3 history of chronic atrial fibrillation on Coumadin  #4 history of GERD stable on Protonix    Plan:     -Discussed shingles vaccine and patient will consider and let us know if interested -Continue close follow-up with Coumadin clinic -no med changes -follow up in one year.  [name] MD [facility] [place]  -

## 2018-05-08 ENCOUNTER — Ambulatory Visit (INDEPENDENT_AMBULATORY_CARE_PROVIDER_SITE_OTHER): Payer: Medicare Other | Admitting: General Practice

## 2018-05-08 ENCOUNTER — Telehealth: Payer: Self-pay | Admitting: Family Medicine

## 2018-05-08 DIAGNOSIS — Z7901 Long term (current) use of anticoagulants: Secondary | ICD-10-CM

## 2018-05-08 DIAGNOSIS — I482 Chronic atrial fibrillation, unspecified: Secondary | ICD-10-CM

## 2018-05-08 DIAGNOSIS — Z952 Presence of prosthetic heart valve: Secondary | ICD-10-CM

## 2018-05-08 LAB — POCT INR: INR: 2.4 (ref 2.0–3.0)

## 2018-05-08 NOTE — Patient Instructions (Addendum)
Pre visit review using our clinic review tool, if applicable. No additional management support is needed unless otherwise documented below in the visit note.  Continue to take 1 tablet daily except 1/2 tablet on Mon/Wed/Friday.  Re-check in 6 weeks.    

## 2018-05-08 NOTE — Telephone Encounter (Signed)
Copied from [place]. Topic: Quick Communication - See Telephone Encounter >> May 08, 2018  2:01 PM Vernona Rieger wrote: CRM for notification. See Telephone encounter for: 05/08/18.   Evelena Peat, RN from [facility] [employer] [facility] called and needs his most recent blood pressure readings and when his last pneumonia vaccine was. Call back @ [phone] ext [phone]

## 2018-05-08 NOTE — Telephone Encounter (Signed)
Pt's states it is ok to give the information to the [facility] care nurse.

## 2018-05-08 NOTE — Telephone Encounter (Signed)
Left message on machine for patient to return our call.  Does patient want [place] to give out this information?  CRM created

## 2018-05-09 NOTE — Telephone Encounter (Signed)
Left message on machine for a release for more information.

## 2018-05-15 ENCOUNTER — Other Ambulatory Visit: Payer: Self-pay | Admitting: Family Medicine

## 2018-05-28 ENCOUNTER — Other Ambulatory Visit: Payer: Self-pay | Admitting: General Practice

## 2018-05-28 ENCOUNTER — Other Ambulatory Visit: Payer: Self-pay | Admitting: Family Medicine

## 2018-05-28 MED ORDER — WARFARIN SODIUM 5 MG PO TABS: ORAL_TABLET | ORAL | 1 refills | Status: DC

## 2018-06-11 DIAGNOSIS — H04123 Dry eye syndrome of bilateral lacrimal glands: Secondary | ICD-10-CM | POA: Diagnosis not present

## 2018-06-11 DIAGNOSIS — H52203 Unspecified astigmatism, bilateral: Secondary | ICD-10-CM | POA: Diagnosis not present

## 2018-06-11 DIAGNOSIS — H0100A Unspecified blepharitis right eye, upper and lower eyelids: Secondary | ICD-10-CM | POA: Diagnosis not present

## 2018-06-11 DIAGNOSIS — Z961 Presence of intraocular lens: Secondary | ICD-10-CM | POA: Diagnosis not present

## 2018-06-19 ENCOUNTER — Ambulatory Visit (INDEPENDENT_AMBULATORY_CARE_PROVIDER_SITE_OTHER): Payer: Medicare Other | Admitting: General Practice

## 2018-06-19 DIAGNOSIS — I482 Chronic atrial fibrillation, unspecified: Secondary | ICD-10-CM

## 2018-06-19 DIAGNOSIS — Z7901 Long term (current) use of anticoagulants: Secondary | ICD-10-CM | POA: Diagnosis not present

## 2018-06-19 DIAGNOSIS — Z952 Presence of prosthetic heart valve: Secondary | ICD-10-CM

## 2018-06-19 LAB — POCT INR: INR: 2 (ref 2.0–3.0)

## 2018-06-19 NOTE — Patient Instructions (Addendum)
Pre visit review using our clinic review tool, if applicable. No additional management support is needed unless otherwise documented below in the visit note.  Continue to take 1 tablet daily except 1/2 tablet on Mon/Wed/Friday.  Re-check in 6 weeks.    

## 2018-07-31 ENCOUNTER — Ambulatory Visit (INDEPENDENT_AMBULATORY_CARE_PROVIDER_SITE_OTHER): Payer: Medicare Other | Admitting: General Practice

## 2018-07-31 DIAGNOSIS — I482 Chronic atrial fibrillation, unspecified: Secondary | ICD-10-CM

## 2018-07-31 DIAGNOSIS — Z7901 Long term (current) use of anticoagulants: Secondary | ICD-10-CM

## 2018-07-31 DIAGNOSIS — Z952 Presence of prosthetic heart valve: Secondary | ICD-10-CM

## 2018-07-31 LAB — POCT INR: INR: 2.7 (ref 2.0–3.0)

## 2018-07-31 NOTE — Patient Instructions (Addendum)
Pre visit review using our clinic review tool, if applicable. No additional management support is needed unless otherwise documented below in the visit note.  Continue to take 1 tablet daily except 1/2 tablet on Mon/Wed/Friday.  Re-check in 6 weeks.    

## 2018-08-06 DIAGNOSIS — S51812A Laceration without foreign body of left forearm, initial encounter: Secondary | ICD-10-CM | POA: Diagnosis not present

## 2018-08-30 DIAGNOSIS — H5310 Unspecified subjective visual disturbances: Secondary | ICD-10-CM | POA: Diagnosis not present

## 2018-08-30 DIAGNOSIS — H43811 Vitreous degeneration, right eye: Secondary | ICD-10-CM | POA: Diagnosis not present

## 2018-08-30 DIAGNOSIS — Z961 Presence of intraocular lens: Secondary | ICD-10-CM | POA: Diagnosis not present

## 2018-09-04 ENCOUNTER — Ambulatory Visit: Payer: Medicare Other

## 2018-09-07 ENCOUNTER — Ambulatory Visit (INDEPENDENT_AMBULATORY_CARE_PROVIDER_SITE_OTHER): Payer: Medicare Other | Admitting: General Practice

## 2018-09-07 DIAGNOSIS — Z23 Encounter for immunization: Secondary | ICD-10-CM

## 2018-09-07 DIAGNOSIS — Z7901 Long term (current) use of anticoagulants: Secondary | ICD-10-CM | POA: Diagnosis not present

## 2018-09-07 DIAGNOSIS — Z952 Presence of prosthetic heart valve: Secondary | ICD-10-CM

## 2018-09-07 DIAGNOSIS — I482 Chronic atrial fibrillation, unspecified: Secondary | ICD-10-CM

## 2018-09-07 LAB — POCT INR: INR: 2.1 (ref 2.0–3.0)

## 2018-09-07 NOTE — Progress Notes (Signed)
Agree with management.  Jesus Shuford J Ezrie Bunyan, MD  

## 2018-09-07 NOTE — Patient Instructions (Addendum)
Pre visit review using our clinic review tool, if applicable. No additional management support is needed unless otherwise documented below in the visit note.  Continue to take 1 tablet daily except 1/2 tablet on Mon/Wed/Friday.  Re-check in 6 weeks.    

## 2018-09-24 ENCOUNTER — Other Ambulatory Visit: Payer: Self-pay | Admitting: Cardiovascular Disease

## 2018-09-24 ENCOUNTER — Other Ambulatory Visit: Payer: Self-pay | Admitting: Family Medicine

## 2018-10-04 DIAGNOSIS — H5310 Unspecified subjective visual disturbances: Secondary | ICD-10-CM | POA: Diagnosis not present

## 2018-10-04 DIAGNOSIS — H43811 Vitreous degeneration, right eye: Secondary | ICD-10-CM | POA: Diagnosis not present

## 2018-10-04 DIAGNOSIS — Z961 Presence of intraocular lens: Secondary | ICD-10-CM | POA: Diagnosis not present

## 2018-10-16 ENCOUNTER — Ambulatory Visit (INDEPENDENT_AMBULATORY_CARE_PROVIDER_SITE_OTHER): Payer: Medicare Other | Admitting: General Practice

## 2018-10-16 DIAGNOSIS — Z952 Presence of prosthetic heart valve: Secondary | ICD-10-CM | POA: Diagnosis not present

## 2018-10-16 DIAGNOSIS — I482 Chronic atrial fibrillation, unspecified: Secondary | ICD-10-CM

## 2018-10-16 DIAGNOSIS — Z7901 Long term (current) use of anticoagulants: Secondary | ICD-10-CM

## 2018-10-16 LAB — POCT INR: INR: 2.3 (ref 2.0–3.0)

## 2018-10-16 NOTE — Patient Instructions (Addendum)
Pre visit review using our clinic review tool, if applicable. No additional management support is needed unless otherwise documented below in the visit note.  Continue to take 1 tablet daily except 1/2 tablet on Mon/Wed/Friday.  Re-check in 6 weeks.    

## 2018-10-24 ENCOUNTER — Encounter: Payer: Self-pay | Admitting: Cardiovascular Disease

## 2018-10-24 ENCOUNTER — Ambulatory Visit: Payer: Medicare Other | Admitting: Cardiovascular Disease

## 2018-10-24 VITALS — BP 122/58 | HR 63 | Ht 61.0 in | Wt 129.8 lb

## 2018-10-24 DIAGNOSIS — I480 Paroxysmal atrial fibrillation: Secondary | ICD-10-CM

## 2018-10-24 DIAGNOSIS — I359 Nonrheumatic aortic valve disorder, unspecified: Secondary | ICD-10-CM | POA: Diagnosis not present

## 2018-10-24 DIAGNOSIS — I5042 Chronic combined systolic (congestive) and diastolic (congestive) heart failure: Secondary | ICD-10-CM

## 2018-10-24 DIAGNOSIS — E782 Mixed hyperlipidemia: Secondary | ICD-10-CM | POA: Diagnosis not present

## 2018-10-24 DIAGNOSIS — I10 Essential (primary) hypertension: Secondary | ICD-10-CM

## 2018-10-24 NOTE — Patient Instructions (Signed)
Medication Instructions:  Your provider recommends that you continue on your current medications as directed. Please refer to the Current Medication list given to you today.    Labwork: TODAY: LFTs, TSH  Testing/Procedures: Your provider has requested that you have an echocardiogram in 6 months. Echocardiography is a painless test that uses sound waves to create images of your heart. It provides your doctor with information about the size and shape of your heart and how well your heart's chambers and valves are working. This procedure takes approximately one hour. There are no restrictions for this procedure.    Follow-Up: Your provider recommends that you schedule a follow-up appointment in 6 months with Dr. [name]. You will be called to arrange your echo and office visit.  Any Other Special Instructions Will Be Listed Below (If Applicable).     If you need a refill on your cardiac medications before your next appointment, please call your pharmacy.

## 2018-10-24 NOTE — Progress Notes (Signed)
Cardiology Office Note Date:  10/24/2018   ID:  Jesus [name], DOB 1941-05-06, MRN [id number]  PCP:  [name], MD  Cardiologist:  [name], MD    Chief Complaint  Patient presents with  . Coronary Artery Disease     History of Present Illness: Jesus [name] is a 77 y.o. male who presents for follow-up evaluation.  The patient has coronary artery disease with remote inferior MI in 1980, multivessel CABG in 1997.  He developed severe aortic stenosis and underwent TAVR in 2016.  He is also followed for persistent atrial fibrillation maintaining sinus rhythm on amiodarone, and chronic combined systolic and diastolic heart failure.  The patient is here with his wife today.  He is doing fine from a cardiac perspective and denies symptoms of chest pain or shortness of breath.  He still has a lot of energy.  He denies orthopnea, PND, or leg swelling.  He does complain of worsening problems with his memory.   Past Medical History:  Diagnosis Date  . 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 Aortic stenosis    mild with a mean aortic valve gradient of 12 mmHg  . Atrial fibrillation (HCC)    holding sinus rhythm on Amiodarone  . CAD (coronary artery disease)    a. S/P Ant MI 1980;  b. 1997 S/P CABG x 8 (LIMA to diag-LAD, SVG to OM1-OM2-OM3, SVG to [facility] - Dr [name]);  c. 01/2015 Cath: 3VD, 8/8 patent grafts.  . Cardiomyopathy   . Dysrhythmia   . Esophageal reflux   . GERD (gastroesophageal reflux disease)   . Headache   . History of colonoscopy   . History of transesophageal echocardiography (TEE) for monitoring   . Other and unspecified hyperlipidemia   . S/P TAVR (transcatheter aortic valve replacement)    a. 03/2015 26 mm Edwards Sapien 3 transcatheter heart valve placed via open left transfemoral approach.  . Skin lesions, generalized    facial which may represent actinic keratoses and possible photosensitivity from Amiodarone  .  Unspecified essential hypertension     Past Surgical History:  Procedure Laterality Date  . CARDIAC CATHETERIZATION    . CARDIOVERSION  11/18/2006   Dr. [name]  . CATARACT EXTRACTION W/ INTRAOCULAR LENS IMPLANT  April '13  (Dr. [name])   left eye only  . CHOLECYSTECTOMY    . CORONARY ARTERY BYPASS GRAFT  02/09/1996   LIMA to diag-LAD, SVG to OM1-OM2-OM3, SVG to [facility]  . EYE SURGERY    . LEFT AND RIGHT HEART CATHETERIZATION WITH CORONARY/GRAFT ANGIOGRAM N/A 01/26/2015   Procedure: LEFT AND RIGHT HEART CATHETERIZATION WITH [name];  Surgeon: [name], MD;  Location: [facility] CATH LAB;  Service: Cardiovascular;  Laterality: N/A;  . TEE WITHOUT CARDIOVERSION N/A 03/10/2015   Procedure: TRANSESOPHAGEAL ECHOCARDIOGRAM (TEE);  Surgeon: [name], MD;  Location: [place];  Service: Open Heart Surgery;  Laterality: N/A;  . TONSILLECTOMY    . TRANSCATHETER AORTIC VALVE REPLACEMENT, TRANSFEMORAL N/A 03/10/2015   Procedure: TRANSCATHETER AORTIC VALVE REPLACEMENT, TRANSFEMORAL;  Surgeon: [name], MD;  Location: [place];  Service: Open Heart Surgery;  Laterality: N/A;    Current Outpatient Medications  Medication Sig Dispense Refill  . acetaminophen (TYLENOL) 500 MG tablet Take 500 mg by mouth every 6 (six) hours as needed for moderate pain.    [name] amiodarone (PACERONE) 200 MG tablet TAKE 1/2 TABLET BY MOUTH DAILY 45 tablet 2  . amLODipine (NORVASC) 5 MG tablet TAKE  1 TABLET BY MOUTH DAILY 90 tablet 2  . amoxicillin (AMOXIL) 400 MG/5ML suspension take 5 teaspoons one hour prior to dental procedure (dose should be amoxicillin 2 gm one hour prior to procedure) 500 mL 0  . Ascorbic Acid (VITAMIN C) 1000 MG tablet Take 1,000 mg by mouth daily.      [name] aspirin 81 MG tablet Take 81 mg by mouth daily.      [name] atorvastatin (LIPITOR) 80 MG tablet TAKE 1/2 TABLET BY MOUTH DAILY 45 tablet 5  . loratadine (CLARITIN) 10 MG tablet Take 10 mg by mouth daily as needed for allergies.       [name] losartan (COZAAR) 100 MG tablet TAKE 1 TABLET BY MOUTH ONCE DAILY 90 tablet 1  . Multiple Vitamins-Minerals (MULTIVITAMIN,TX-MINERALS) tablet Take 1 tablet by mouth daily.      [name] warfarin (COUMADIN) 5 MG tablet TAKE AS DIRECTED BY COUMADIN CLINIC 90 tablet 1   No current facility-administered medications for this visit.     Allergies:   Codeine and Iodine   Social History:  The patient  reports that he has never smoked. He has never used smokeless tobacco. He reports that he does not drink alcohol or use drugs.   Family History:  The patient's family history includes Atrial fibrillation in his brother; Crohn's disease in his mother; Heart attack (age of onset: 20) in his father; Heart disease in his father and paternal uncle; Heart failure in his father; Prostate cancer in his paternal uncle; Stroke (age of onset: 51) in his mother.    ROS:  Please see the history of present illness.  Otherwise, review of systems is positive for hearing loss.  All other systems are reviewed and negative.    PHYSICAL EXAM: VS:  BP (!) 122/58   Pulse 63   Ht 5\' 1"  (1.549 m)   Wt 129 lb 12.8 oz (58.9 kg)   SpO2 96%   BMI 24.53 kg/m  , BMI Body mass index is 24.53 kg/m. GEN: Well nourished, well developed, in no acute distress HEENT: normal Neck: no JVD, no masses. No carotid bruits Cardiac: RRR with grade 2/6 systolic ejection murmur at the right upper sternal border and grade 1/6 diastolic decrescendo murmur at the right upper sternal border             Respiratory:  clear to auscultation bilaterally, normal work of breathing GI: soft, nontender, nondistended, + BS MS: no deformity or atrophy Ext: no pretibial edema, pedal pulses 2+= bilaterally Skin: warm and dry, no rash Neuro:  Strength and sensation are intact Psych: euthymic mood, full affect  EKG:  EKG is not ordered today.  Recent Labs: 04/12/2018: ALT 21; BUN 15; Creatinine, Ser 0.92; Potassium 4.6; Sodium 138; TSH 3.730   Lipid  Panel     Component Value Date/Time   CHOL 141 04/12/2018 0804   TRIG 109 04/12/2018 0804   TRIG 67 11/20/2006 0748   HDL 49 04/12/2018 0804   CHOLHDL 2.9 04/12/2018 0804   CHOLHDL 3.0 09/12/2016 1311   VLDL 24 09/12/2016 1311   LDLCALC 70 04/12/2018 0804      Wt Readings from Last 3 Encounters:  10/24/18 129 lb 12.8 oz (58.9 kg)  04/18/18 131 lb 12.8 oz (59.8 kg)  04/16/18 132 lb 9.6 oz (60.1 kg)     ASSESSMENT AND PLAN: 1.  Aortic valve disease status post TAVR: The patient's aortic valve prosthesis has been functioning normally by serial echo studies.  He will continue  with SBE prophylaxis.  Maintaining chronic oral anticoagulation with warfarin.  I recommended an echocardiogram prior to his next visit for 2-year follow-up from his last echo after TAVR  2.  Coronary artery disease without angina: Patent bypass grafts by cardiac catheterization in 2016.  No anginal symptoms.  Patient is treated with aspirin 81 mg daily, a high intensity statin drug, and an ARB.  3.  Persistent atrial fibrillation: Maintaining sinus rhythm on oral amiodarone.  Continues with warfarin.  Labs from May 2019 reviewed with normal LFTs and thyroid function studies.  He is due for repeat labs today and these will be drawn.  Current medicines are reviewed with the patient today.  The patient does not have concerns regarding medicines.  Labs/ tests ordered today include:   Orders Placed This Encounter  Procedures  . Hepatic function panel  . TSH  . CBC with Differential/Platelet  . Comprehensive metabolic panel  . Lipid panel  . TSH  . ECHOCARDIOGRAM COMPLETE    Disposition:   FU 6 months with an echo and labs prior to the visit  Signed, [name], MD  10/24/2018 5:13 PM    [facility] [place], [place], [place]  [address] Phone: [phone]; Fax: [phone]

## 2018-10-25 DIAGNOSIS — L820 Inflamed seborrheic keratosis: Secondary | ICD-10-CM | POA: Diagnosis not present

## 2018-10-25 DIAGNOSIS — D485 Neoplasm of uncertain behavior of skin: Secondary | ICD-10-CM | POA: Diagnosis not present

## 2018-10-25 DIAGNOSIS — L570 Actinic keratosis: Secondary | ICD-10-CM | POA: Diagnosis not present

## 2018-10-25 LAB — TSH: TSH: 3.01 u[IU]/mL (ref 0.450–4.500)

## 2018-10-25 LAB — HEPATIC FUNCTION PANEL
ALBUMIN: 3.9 g/dL (ref 3.5–4.8)
ALT: 19 IU/L (ref 0–44)
AST: 21 IU/L (ref 0–40)
Alkaline Phosphatase: 116 IU/L (ref 39–117)
Bilirubin Total: 0.4 mg/dL (ref 0.0–1.2)
Bilirubin, Direct: 0.13 mg/dL (ref 0.00–0.40)
TOTAL PROTEIN: 6.3 g/dL (ref 6.0–8.5)

## 2018-10-30 ENCOUNTER — Encounter: Payer: Self-pay | Admitting: Family Medicine

## 2018-10-30 ENCOUNTER — Ambulatory Visit (INDEPENDENT_AMBULATORY_CARE_PROVIDER_SITE_OTHER): Payer: Medicare Other | Admitting: Family Medicine

## 2018-10-30 VITALS — BP 110/50 | HR 62 | Temp 98.1°F | Wt 132.8 lb

## 2018-10-30 DIAGNOSIS — I482 Chronic atrial fibrillation, unspecified: Secondary | ICD-10-CM

## 2018-10-30 DIAGNOSIS — I10 Essential (primary) hypertension: Secondary | ICD-10-CM

## 2018-10-30 DIAGNOSIS — K219 Gastro-esophageal reflux disease without esophagitis: Secondary | ICD-10-CM | POA: Diagnosis not present

## 2018-10-30 NOTE — Progress Notes (Signed)
Subjective:     Patient ID: Jesus [name], male   DOB: July 14, 1941, 77 y.o.   MRN: [id number]  HPI Patient seen for medical follow-up.  He has history of chronic atrial fibrillation, GERD, hyperlipidemia, CAD, hypertension.  Medications reviewed.  He had recent labs done including hepatic function and thyroid per cardiology and these were normal.  He remains on amiodarone.  Compliant with medications.  Denies any specific complaints.  No peripheral edema.  He did recently run out of Protonix and has not had any breakthrough reflux symptoms since stopping that.  Past Medical History:  Diagnosis Date  . 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 Aortic stenosis    mild with a mean aortic valve gradient of 12 mmHg  . Atrial fibrillation (HCC)    holding sinus rhythm on Amiodarone  . CAD (coronary artery disease)    a. S/P Ant MI 1980;  b. 1997 S/P CABG x 8 (LIMA to diag-LAD, SVG to OM1-OM2-OM3, SVG to [facility] - Dr [name]);  c. 01/2015 Cath: 3VD, 8/8 patent grafts.  . Cardiomyopathy   . Dysrhythmia   . Esophageal reflux   . GERD (gastroesophageal reflux disease)   . Headache   . History of colonoscopy   . History of transesophageal echocardiography (TEE) for monitoring   . Other and unspecified hyperlipidemia   . S/P TAVR (transcatheter aortic valve replacement)    a. 03/2015 26 mm Edwards Sapien 3 transcatheter heart valve placed via open left transfemoral approach.  . Skin lesions, generalized    facial which may represent actinic keratoses and possible photosensitivity from Amiodarone  . Unspecified essential hypertension    Past Surgical History:  Procedure Laterality Date  . CARDIAC CATHETERIZATION    . CARDIOVERSION  11/18/2006   Dr. [name]  . CATARACT EXTRACTION W/ INTRAOCULAR LENS IMPLANT  April '13  (Dr. [name])   left eye only  . CHOLECYSTECTOMY    . CORONARY ARTERY BYPASS GRAFT  02/09/1996   LIMA to diag-LAD, SVG to OM1-OM2-OM3, SVG to  [facility]  . EYE SURGERY    . LEFT AND RIGHT HEART CATHETERIZATION WITH CORONARY/GRAFT ANGIOGRAM N/A 01/26/2015   Procedure: LEFT AND RIGHT HEART CATHETERIZATION WITH [name];  Surgeon: [name], MD;  Location: [facility] CATH LAB;  Service: Cardiovascular;  Laterality: N/A;  . TEE WITHOUT CARDIOVERSION N/A 03/10/2015   Procedure: TRANSESOPHAGEAL ECHOCARDIOGRAM (TEE);  Surgeon: [name], MD;  Location: [place];  Service: Open Heart Surgery;  Laterality: N/A;  . TONSILLECTOMY    . TRANSCATHETER AORTIC VALVE REPLACEMENT, TRANSFEMORAL N/A 03/10/2015   Procedure: TRANSCATHETER AORTIC VALVE REPLACEMENT, TRANSFEMORAL;  Surgeon: [name], MD;  Location: [place];  Service: Open Heart Surgery;  Laterality: N/A;    reports that he has never smoked. He has never used smokeless tobacco. He reports that he does not drink alcohol or use drugs. family history includes Atrial fibrillation in his brother; Crohn's disease in his mother; Heart attack (age of onset: 16) in his father; Heart disease in his father and paternal uncle; Heart failure in his father; Prostate cancer in his paternal uncle; Stroke (age of onset: 75) in his mother. Allergies  Allergen Reactions  . Codeine Other (See Comments)    Pt does not remember  . Iodine Swelling     Review of Systems  Constitutional: Negative for fatigue and unexpected weight change.  Eyes: Negative for visual disturbance.  Respiratory: Negative for cough, chest tightness and shortness of breath.   Cardiovascular: Negative  for chest pain, palpitations and leg swelling.  Endocrine: Negative for polydipsia and polyuria.  Neurological: Negative for dizziness, syncope, weakness, light-headedness and headaches.       Objective:   Physical Exam  Constitutional: He is oriented to person, place, and time. He appears well-developed and well-nourished.  HENT:  Right Ear: External ear normal.  Left Ear: External ear normal.  Mouth/Throat:  Oropharynx is clear and moist.  Eyes: Pupils are equal, round, and reactive to light.  Neck: Neck supple. No thyromegaly present.  Cardiovascular: Normal rate and regular rhythm.  Pulmonary/Chest: Effort normal and breath sounds normal. No respiratory distress. He has no wheezes. He has no rales.  Musculoskeletal: He exhibits no edema.  Neurological: He is alert and oriented to person, place, and time.       Assessment:     #1 hypertension stable and at goal  #2 chronic atrial fibrillation on Coumadin  #3 history of GERD currently stable off medications    Plan:     -Flu vaccine already given -Consider over-the-counter Pepcid 20 mg twice daily for any breakthrough GERD symptoms.  Otherwise try to avoid PPI at this time -Routine follow-up in 6 months and sooner as needed  [name] MD [place] [facility]

## 2018-11-20 ENCOUNTER — Ambulatory Visit (INDEPENDENT_AMBULATORY_CARE_PROVIDER_SITE_OTHER): Payer: Medicare Other | Admitting: General Practice

## 2018-11-20 DIAGNOSIS — Z952 Presence of prosthetic heart valve: Secondary | ICD-10-CM

## 2018-11-20 DIAGNOSIS — Z7901 Long term (current) use of anticoagulants: Secondary | ICD-10-CM

## 2018-11-20 DIAGNOSIS — I482 Chronic atrial fibrillation, unspecified: Secondary | ICD-10-CM

## 2018-11-20 LAB — POCT INR: INR: 2.4 (ref 2.0–3.0)

## 2018-11-20 NOTE — Patient Instructions (Addendum)
Pre visit review using our clinic review tool, if applicable. No additional management support is needed unless otherwise documented below in the visit note.  Continue to take 1 tablet daily except 1/2 tablet on Mon/Wed/Friday.  Re-check in 6 weeks.    

## 2018-12-14 ENCOUNTER — Telehealth: Payer: Self-pay | Admitting: Family Medicine

## 2018-12-14 NOTE — Telephone Encounter (Signed)
Has he tried OTC Pepcid 20 mg po bid.  That would be my first choice

## 2018-12-14 NOTE — Telephone Encounter (Signed)
Called patient and gave him instructions per Dr. Elease Hashimoto. Patient verbalized an understanding.

## 2018-12-14 NOTE — Telephone Encounter (Signed)
OK to fill

## 2018-12-14 NOTE — Telephone Encounter (Unsigned)
Copied from [place] [id number]. Topic: General - Other >> Dec 14, 2018 10:53 AM Yvette Rack wrote: Reason for CRM: Pt stated he would like to start back taking the pantoprazole (PROTONIX) 40 MG tablet. Pt stated Zantac is no being sold and Tums does not work. Pt requests that the Rx for pantoprazole (PROTONIX) 40 MG tablet be sent to [place], [place]

## 2018-12-20 ENCOUNTER — Telehealth: Payer: Self-pay | Admitting: Cardiovascular Disease

## 2018-12-20 NOTE — Telephone Encounter (Signed)
Fasting labs and echo scheduled 5/27. Follow-up with Dr. [name] scheduled 5/29. Patient was grateful for assistance.

## 2018-12-20 NOTE — Telephone Encounter (Signed)
Left message to call back  

## 2018-12-20 NOTE — Telephone Encounter (Signed)
New message   Please call patient to setup echocardiogram.

## 2018-12-21 ENCOUNTER — Other Ambulatory Visit: Payer: Self-pay | Admitting: Family Medicine

## 2019-01-01 ENCOUNTER — Ambulatory Visit: Payer: Medicare Other

## 2019-01-04 ENCOUNTER — Ambulatory Visit (INDEPENDENT_AMBULATORY_CARE_PROVIDER_SITE_OTHER): Payer: Medicare Other | Admitting: General Practice

## 2019-01-04 DIAGNOSIS — Z7901 Long term (current) use of anticoagulants: Secondary | ICD-10-CM | POA: Diagnosis not present

## 2019-01-04 DIAGNOSIS — I482 Chronic atrial fibrillation, unspecified: Secondary | ICD-10-CM

## 2019-01-04 DIAGNOSIS — Z952 Presence of prosthetic heart valve: Secondary | ICD-10-CM

## 2019-01-04 LAB — POCT INR: INR: 1.9 — AB (ref 2.0–3.0)

## 2019-01-04 NOTE — Patient Instructions (Addendum)
Pre visit review using our clinic review tool, if applicable. No additional management support is needed unless otherwise documented below in the visit note.  Take 1 tablet today (1/31) and then continue to take 1 tablet daily except 1/2 tablet on Mon/Wed/Friday.  Re-check in 4 weeks.

## 2019-02-01 ENCOUNTER — Ambulatory Visit (INDEPENDENT_AMBULATORY_CARE_PROVIDER_SITE_OTHER): Payer: Medicare Other | Admitting: General Practice

## 2019-02-01 DIAGNOSIS — Z952 Presence of prosthetic heart valve: Secondary | ICD-10-CM

## 2019-02-01 DIAGNOSIS — Z7901 Long term (current) use of anticoagulants: Secondary | ICD-10-CM | POA: Diagnosis not present

## 2019-02-01 DIAGNOSIS — I482 Chronic atrial fibrillation, unspecified: Secondary | ICD-10-CM

## 2019-02-01 LAB — POCT INR: INR: 2.1 (ref 2.0–3.0)

## 2019-02-01 NOTE — Patient Instructions (Addendum)
Pre visit review using our clinic review tool, if applicable. No additional management support is needed unless otherwise documented below in the visit note.  Continue to take 1 tablet daily except 1/2 tablet on Mon/Wed/Friday.  Re-check in 4 weeks.  

## 2019-02-16 ENCOUNTER — Other Ambulatory Visit: Payer: Self-pay

## 2019-02-16 ENCOUNTER — Ambulatory Visit (INDEPENDENT_AMBULATORY_CARE_PROVIDER_SITE_OTHER): Payer: Medicare Other | Admitting: Family Medicine

## 2019-02-16 VITALS — BP 116/56 | HR 87 | Temp 98.3°F | Resp 16 | Ht 61.0 in | Wt 127.0 lb

## 2019-02-16 DIAGNOSIS — J00 Acute nasopharyngitis [common cold]: Secondary | ICD-10-CM | POA: Diagnosis not present

## 2019-02-16 DIAGNOSIS — R059 Cough, unspecified: Secondary | ICD-10-CM

## 2019-02-16 DIAGNOSIS — R05 Cough: Secondary | ICD-10-CM | POA: Diagnosis not present

## 2019-02-16 MED ORDER — ALBUTEROL SULFATE HFA 108 (90 BASE) MCG/ACT IN AERS: 2.0000 | INHALATION_SPRAY | Freq: Four times a day (QID) | RESPIRATORY_TRACT | 0 refills | Status: DC | PRN

## 2019-02-16 NOTE — Patient Instructions (Signed)
Keep using robitussin for cough  If you get worse rather than continue to improve, call office right away  Inhaler sent to pharmacy to use if you need to

## 2019-02-16 NOTE — Progress Notes (Signed)
Subjective:    Patient ID: Jesus [name], male    DOB: 1941-08-10, 78 y.o.   MRN: [id number]  HPI   Patient presents to clinic complaining of dry cough and some nasal congestion for 2 days.,  States he did feel pretty awful yesterday, but when he woke up this morning states he felt almost completely better.  Still has a slightly dry cough and a little bit of runny nose however is pleased that he is feeling better.  Patient did take a few doses of Robitussin yesterday, and believes they finally kicked him to help his cough improved.  Denies fever or chills.  Denies body aches.  Denies chest pain, feeling short of breath or wheezing.  Denies nausea, vomiting or diarrhea.  Wife states that she did have similar symptoms last week, and improved after a few days.  Suspects her husband is having the same illness.  Patient Active Problem List   Diagnosis Date Noted  . Chronic combined systolic and diastolic CHF (congestive heart failure) ([place]) 04/16/2018  . Long term (current) use of anticoagulants 08/25/2017  . Actinic keratoses 03/21/2017  . Internal hemorrhoid, bleeding 07/01/2015  . Rectal bleeding 06/15/2015  . Internal hemorrhoids 06/15/2015  . Other constipation 06/15/2015  . Chronic anticoagulation-Couamdin 03/12/2015  . S/P TAVR (transcatheter aortic valve replacement) 03/10/2015  . Anemia 09/03/2014  . Encounter for therapeutic drug monitoring 12/31/2013  . Yellow jacket sting 08/06/2013  . Low back pain on right side with sciatica 04/24/2013  . Severe aortic stenosis 08/10/2012  . CAD (coronary artery disease) 08/10/2012  . Left ventricular dysfunction 08/10/2012  . Routine health maintenance 07/12/2012  . BEE STING REACTION, LOCAL 09/21/2010  . BEN LOC HYPERPLASIA PROS W/UR OBST & OTH LUTS 08/02/2010  . Acute and chronic cholecystitis 02/01/2010  . Abdominal pain, generalized 01/29/2010  . CORONARY ATHEROSCLEROSIS NATIVE CORONARY ARTERY 10/13/2009  . HYPERTHYROIDISM  05/22/2009  . INSOMNIA 05/22/2009  . Hyperlipidemia 04/22/2009  . 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 CAD, AUTOLOGOUS BYPASS GRAFT 10/16/2008  . Essential hypertension 09/07/2007  . Aortic valve disorder 09/07/2007  . Chronic atrial fibrillation 09/07/2007  . GASTROESOPHAGEAL REFLUX DISEASE 09/07/2007  . 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   Social History   Tobacco Use  . Smoking status: Never Smoker  . Smokeless tobacco: Never Used  . Tobacco comment: Does not smoke.  Substance Use Topics  . Alcohol use: No    Alcohol/week: 0.0 standard drinks   Review of Systems  Constitutional: Negative for chills, fatigue and fever.  HENT: +mild runny nose. Negative for ear pain, sinus pain and sore throat.   Eyes: Negative.   Respiratory: +cough. Negative for shortness of breath and wheezing.   Cardiovascular: Negative for chest pain, palpitations and leg swelling.  Gastrointestinal: Negative for abdominal pain, diarrhea, nausea and vomiting.  Genitourinary: Negative for dysuria, frequency and urgency.  Musculoskeletal: Negative for arthralgias and myalgias.  Skin: Negative for color change, pallor and rash.  Neurological: Negative for syncope, light-headedness and headaches.  Psychiatric/Behavioral: The patient is not nervous/anxious.       Objective:   Physical Exam Vitals signs and nursing note reviewed.  Constitutional:      General: He is not in acute distress.    Appearance: He is not ill-appearing or toxic-appearing.  HENT:     Head: Normocephalic and atraumatic.     Nose: Congestion and rhinorrhea present.     Mouth/Throat:     Mouth: Mucous membranes are moist.  Eyes:  General: No scleral icterus.    Extraocular Movements: Extraocular movements intact.     Conjunctiva/sclera: Conjunctivae normal.     Pupils: Pupils are equal, round, and reactive to light.  Neck:     Musculoskeletal: Neck supple. No neck rigidity.  Cardiovascular:     Rate and Rhythm: Normal rate  and regular rhythm.     Heart sounds: Normal heart sounds.  Pulmonary:     Effort: Pulmonary effort is normal. No respiratory distress.     Breath sounds: Normal breath sounds. No wheezing, rhonchi or rales.  Lymphadenopathy:     Cervical: No cervical adenopathy.  Skin:    General: Skin is warm and dry.     Coloration: Skin is not jaundiced or pale.  Neurological:     Mental Status: He is alert and oriented to person, place, and time.  Psychiatric:        Mood and Affect: Mood normal.        Behavior: Behavior normal.    Vitals:   02/16/19 1032  BP: (!) 116/56  Pulse: 87  Resp: 16  Temp: 98.3 F (36.8 C)  SpO2: 97%      Assessment & Plan:    Common cold/cough - suspect patient's symptoms are related to a common cold/viral URI causing congestion and cough.  I did offer to swab for flu in the clinic however patient's wife declines and states "I do not think he has the flu because I do not believe I had the flu."  Advised patient to keep using Robitussin as needed for cough, getting plenty of rest, doing good handwashing and staying home as much as possible to avoid getting sick from others.  I did send an inhaler to the pharmacy to use if needed for any shortness of breath.  Patient aware that if his symptoms do worsen in any way he must call office and or go to ER for evaluation.  Patient will otherwise keep regularly scheduled follow-up with PCP as planned and will return to clinic sooner if any issues arise.

## 2019-03-01 ENCOUNTER — Ambulatory Visit: Payer: Self-pay | Admitting: *Deleted

## 2019-03-01 ENCOUNTER — Ambulatory Visit (INDEPENDENT_AMBULATORY_CARE_PROVIDER_SITE_OTHER): Payer: Medicare Other | Admitting: Family Medicine

## 2019-03-01 ENCOUNTER — Other Ambulatory Visit: Payer: Self-pay

## 2019-03-01 DIAGNOSIS — R0982 Postnasal drip: Secondary | ICD-10-CM

## 2019-03-01 NOTE — Telephone Encounter (Signed)
Patient has a telephone appointment today at [place] patient and wife stated that he took the dog for a walk but he will be available at 3pm for visit.

## 2019-03-01 NOTE — Telephone Encounter (Signed)
Message from Berneta Levins sent at 03/01/2019 12:29 PM EDT   Summary: drainage in throat   Pt called and left message on [place] on 02/28/2019. States that he has had drainage in his neck and throat for the past week and would like to know what he can do for this.          Returned call to patient regarding advice for to get rid of drainage in his throat from a previous cold.  He denies runny or stuffy nose. No fever. Cough is productive with white phlegm.  Home care advice given to him: gargling with warm salt water, taking a decongestant and drinking fluids, including hot tea with lemon and honey, a couple of teaspoon of honey at bedtime and using a humidifier. And to check with his pharmacy regarding what meds that he can take over the counter for this.  Pt voiced understanding.  Routing to [facility] [place] for any other recommendations.

## 2019-03-01 NOTE — Progress Notes (Signed)
Patient ID: Jesus [name], male   DOB: 11/01/1941, 78 y.o.   MRN: [id number]  Virtual Visit via Telephone Note  I connected with Jesus [name] on 03/01/19 at  3:00 PM EDT by telephone and verified that I am speaking with the correct person using two identifiers.   I discussed the limitations, risks, security and privacy concerns of performing an evaluation and management service by telephone and the availability of in person appointments. I also discussed with the patient that there may be a patient responsible charge related to this service. The patient expressed understanding and agreed to proceed.  Location patient: home Location provider: work or home office Participants present for the call: patient, provider Patient did not have a visit in the prior 7 days to address this/these issue(s).   History of Present Illness: Patient relates a few day history of increased nasal congestion and postnasal drainage.  Denies any sore throat.  Has been taking some hot tea which seems to help.  Only very rare cough.  No dyspnea.  No fever.  No chills.  No myalgias.  Denies any recent travels.  He took just 1 dose of Claritin.  Has not tried any nasal steroids.  No known sick contacts.   Observations/Objective: Patient sounds cheerful and well on the phone. I do not appreciate any SOB. Speech and thought processing are grossly intact. Patient reported vitals:  Assessment and Plan: Postnasal drip.-Suspect allergic.  He does not any red flags such as fever or dyspnea  -Try over-the-counter Flonase daily and he can continue with Claritin  Follow Up Instructions:  -Follow-up as needed for any fever, increased shortness of breath, or other concerns   [id number] 5-10 [id number] 11-20 [id number] 21-30 I did not refer this patient for an OV in the next 24 hours for this/these issue(s).  I discussed the assessment and treatment plan with the patient. The patient was provided an opportunity to ask questions and  all were answered. The patient agreed with the plan and demonstrated an understanding of the instructions.   The patient was advised to call back or seek an in-person evaluation if the symptoms worsen or if the condition fails to improve as anticipated.  I provided 12 minutes of non-face-to-face time during this encounter.   [name], MD

## 2019-03-08 ENCOUNTER — Other Ambulatory Visit: Payer: Self-pay

## 2019-03-08 ENCOUNTER — Ambulatory Visit (INDEPENDENT_AMBULATORY_CARE_PROVIDER_SITE_OTHER): Payer: Medicare Other | Admitting: General Practice

## 2019-03-08 DIAGNOSIS — I482 Chronic atrial fibrillation, unspecified: Secondary | ICD-10-CM

## 2019-03-08 DIAGNOSIS — Z7901 Long term (current) use of anticoagulants: Secondary | ICD-10-CM | POA: Diagnosis not present

## 2019-03-08 DIAGNOSIS — Z952 Presence of prosthetic heart valve: Secondary | ICD-10-CM

## 2019-03-08 LAB — POCT INR: INR: 3.1 — AB (ref 2.0–3.0)

## 2019-03-08 NOTE — Patient Instructions (Addendum)
Pre visit review using our clinic review tool, if applicable. No additional management support is needed unless otherwise documented below in the visit note.  Skip coumadin tomorrow and then continue to take 1 tablet daily except 1/2 tablet on Mon/Wed/Friday.  Re-check in 4 weeks.

## 2019-03-21 ENCOUNTER — Other Ambulatory Visit: Payer: Self-pay | Admitting: Family Medicine

## 2019-04-05 ENCOUNTER — Ambulatory Visit (INDEPENDENT_AMBULATORY_CARE_PROVIDER_SITE_OTHER): Payer: Medicare Other | Admitting: General Practice

## 2019-04-05 ENCOUNTER — Telehealth: Payer: Self-pay | Admitting: *Deleted

## 2019-04-05 DIAGNOSIS — Z952 Presence of prosthetic heart valve: Secondary | ICD-10-CM

## 2019-04-05 DIAGNOSIS — Z7901 Long term (current) use of anticoagulants: Secondary | ICD-10-CM

## 2019-04-05 DIAGNOSIS — I482 Chronic atrial fibrillation, unspecified: Secondary | ICD-10-CM

## 2019-04-05 LAB — POCT INR: INR: 2.3 (ref 2.0–3.0)

## 2019-04-05 NOTE — Patient Instructions (Addendum)
Pre visit review using our clinic review tool, if applicable. No additional management support is needed unless otherwise documented below in the visit note.  Continue to take 1 tablet daily except 1/2 tablet on Mon/Wed/Friday.  Re-check in 4 weeks.  

## 2019-04-05 NOTE — Telephone Encounter (Signed)
S/w pt about moving up visit to see Truitt Merle, NP on May 5 @ 3:30pm. Pt stated that Dr. [name] wanted to see pt this time due to seeing a a APP last time.  Do not see this in the chart.  Pt stated to call back next week to see about upcoming echo and lab appt prior to Dr.Cooper ov. ig pt is still doing this. Pt would like to go on wait list.  Not sure if pt needs to be seen sooner than Nov. Pt was not added to wait list.   Will send to Sammuel Bailiff, RN to advise.

## 2019-04-08 ENCOUNTER — Telehealth: Payer: Self-pay | Admitting: Cardiovascular Disease

## 2019-04-08 NOTE — Telephone Encounter (Signed)
Follow up   Please call patient. Patient has question about rescheduling the upcoming appt with Dr. [name]

## 2019-04-08 NOTE — Telephone Encounter (Signed)
New message   Patient has questions about his upcoming appt with Dr. [name]. Please call the patient.

## 2019-04-08 NOTE — Telephone Encounter (Signed)
Please disregard previous messages.

## 2019-04-08 NOTE — Telephone Encounter (Signed)
New message   Patient has questions about his upcoming appt with Dr. [name]. He states that he received a call to reschedule appt. Please call the patient.

## 2019-04-08 NOTE — Telephone Encounter (Signed)
Informed [name] his appointment with Dr. [name] 5/29 will be cancelled due to hospital coverage needs.  He understands at this point to keep his echo and lab appointments for 5/27.  He understands he will be called to arrange follow-up. He was grateful for call back.

## 2019-04-08 NOTE — Telephone Encounter (Signed)
No message needed °

## 2019-04-11 ENCOUNTER — Other Ambulatory Visit: Payer: Self-pay | Admitting: Family Medicine

## 2019-04-12 ENCOUNTER — Telehealth: Payer: Self-pay | Admitting: Family Medicine

## 2019-04-12 MED ORDER — AMOXICILLIN 400 MG/5ML PO SUSR: ORAL | 0 refills | Status: DC

## 2019-04-12 NOTE — Telephone Encounter (Signed)
Where do you see 75 ml?  Refill just the 50 ml

## 2019-04-12 NOTE — Telephone Encounter (Signed)
Copied from [place] [id number]. Topic: Quick Communication - Rx Refill/Question >> Apr 12, 2019  2:49 PM Leward Quan A wrote: Medication: amoxicillin (AMOXIL) 400 MG/5ML suspension   Has the patient contacted their pharmacy? Yes.   (Agent: If no, request that the patient contact the pharmacy for the refill.) (Agent: If yes, when and what did the pharmacy advise?)  Preferred Pharmacy (with phone number or street name): [place], [place] [phone] (Phone) [phone] (Fax)    Agent: Please be advised that RX refills may take up to 3 business days. We ask that you follow-up with your pharmacy.

## 2019-04-12 NOTE — Telephone Encounter (Signed)
Scheduled patient for e-visit with Dr. [name] 6/3. He does not have a smart phone, but consents to a phone call visit. He was grateful for assistance.      Virtual Visit Pre-Appointment Phone Call  1. Confirm consent - "In the setting of the current Covid19 crisis, you are scheduled for a (phone or video) visit with your provider on (date) at (time).  Just as we do with many in-office visits, in order for you to participate in this visit, we must obtain consent.  If you'd like, I can send this to your mychart (if signed up) or email for you to review.  Otherwise, I can obtain your verbal consent now.  All virtual visits are billed to your insurance company just like a normal visit would be.  By agreeing to a virtual visit, we'd like you to understand that the technology does not allow for your provider to perform an examination, and thus [name] limit your provider's ability to fully assess your condition. If your provider identifies any concerns that need to be evaluated in person, we will make arrangements to do so.  Finally, though the technology is pretty good, we cannot assure that it will always work on either your or our end, and in the setting of a video visit, we [name] have to convert it to a phone-only visit.  In either situation, we cannot ensure that we have a secure connection.  Are you willing to proceed?" STAFF: Did the patient verbally acknowledge consent to telehealth visit? Document YES/NO here: YES  TELEPHONE CALL NOTE  Jesus [name] has been deemed a candidate for a follow-up tele-health visit to limit community exposure during the Covid-19 pandemic. I spoke with the patient via phone to ensure availability of phone/video source, confirm preferred email & phone number, and discuss instructions and expectations.  I reminded Jesus [name] to be prepared with any vital sign and/or heart rhythm information that could potentially be obtained via home monitoring, at the time of his visit.  I reminded Jesus [name] to expect a phone call prior to his visit.  Jesus Parma, RN 04/12/2019 5:32 PM

## 2019-04-12 NOTE — Telephone Encounter (Signed)
OK to fill both of these? 50 mL and 75 mL?

## 2019-04-12 NOTE — Telephone Encounter (Signed)
rx for Amoxicillin sent.

## 2019-04-30 ENCOUNTER — Telehealth (HOSPITAL_COMMUNITY): Payer: Self-pay | Admitting: Radiology

## 2019-04-30 NOTE — Telephone Encounter (Signed)
COVID-19 Pre-Screening Questions:  . Do you currently have a fever? No (yes = cancel and refer to pcp for e-visit) . Have you recently travelled on a cruise, internationally, or to [place], [place], [place], [place], [place], or [place], [place] ([employer]) ? No (yes = cancel, stay home, monitor symptoms, and contact pcp or initiate e-visit if symptoms develop) . Have you been in contact with someone that is currently pending confirmation of Covid19 testing or has been confirmed to have the Covid19 virus?  No (yes = cancel, stay home, away from tested individual, monitor symptoms, and contact pcp or initiate e-visit if symptoms develop) . Are you currently experiencing fatigue or cough? No (yes = pt should be prepared to have a mask placed at the time of their visit). . Reiterated no additional visitors. . Arrive no earlier than 15 minutes before appointment time. . Please bring own mask.   

## 2019-05-01 ENCOUNTER — Ambulatory Visit (HOSPITAL_COMMUNITY): Payer: Medicare Other | Attending: Internal Medicine

## 2019-05-01 ENCOUNTER — Other Ambulatory Visit: Payer: Self-pay

## 2019-05-01 ENCOUNTER — Other Ambulatory Visit: Payer: Medicare Other | Admitting: *Deleted

## 2019-05-01 DIAGNOSIS — I359 Nonrheumatic aortic valve disorder, unspecified: Secondary | ICD-10-CM

## 2019-05-01 DIAGNOSIS — I5042 Chronic combined systolic (congestive) and diastolic (congestive) heart failure: Secondary | ICD-10-CM

## 2019-05-01 LAB — COMPREHENSIVE METABOLIC PANEL
ALT: 22 IU/L (ref 0–44)
AST: 25 IU/L (ref 0–40)
Albumin/Globulin Ratio: 1.9 (ref 1.2–2.2)
Albumin: 4.3 g/dL (ref 3.7–4.7)
Alkaline Phosphatase: 113 IU/L (ref 39–117)
BUN/Creatinine Ratio: 19 (ref 10–24)
BUN: 17 mg/dL (ref 8–27)
Bilirubin Total: 0.9 mg/dL (ref 0.0–1.2)
CO2: 23 mmol/L (ref 20–29)
Calcium: 9.1 mg/dL (ref 8.6–10.2)
Chloride: 102 mmol/L (ref 96–106)
Creatinine, Ser: 0.91 mg/dL (ref 0.76–1.27)
GFR calc Af Amer: 92 mL/min/{1.73_m2} (ref >59)
GFR calc non Af Amer: 79 mL/min/{1.73_m2} (ref >59)
Globulin, Total: 2.3 g/dL (ref 1.5–4.5)
Glucose: 138 mg/dL — ABNORMAL HIGH (ref 65–99)
Potassium: 4 mmol/L (ref 3.5–5.2)
Sodium: 138 mmol/L (ref 134–144)
Total Protein: 6.6 g/dL (ref 6.0–8.5)

## 2019-05-01 LAB — CBC WITH DIFFERENTIAL/PLATELET
Basophils Absolute: 0 10*3/uL (ref 0.0–0.2)
Basos: 1 %
EOS (ABSOLUTE): 0.1 10*3/uL (ref 0.0–0.4)
Eos: 2 %
Hematocrit: 34.1 % — ABNORMAL LOW (ref 37.5–51.0)
Hemoglobin: 11.9 g/dL — ABNORMAL LOW (ref 13.0–17.7)
Immature Grans (Abs): 0 10*3/uL (ref 0.0–0.1)
Immature Granulocytes: 0 %
Lymphocytes Absolute: 1.9 10*3/uL (ref 0.7–3.1)
Lymphs: 23 %
MCH: 34.2 pg — ABNORMAL HIGH (ref 26.6–33.0)
MCHC: 34.9 g/dL (ref 31.5–35.7)
MCV: 98 fL — ABNORMAL HIGH (ref 79–97)
Monocytes Absolute: 0.9 10*3/uL (ref 0.1–0.9)
Monocytes: 12 %
Neutrophils Absolute: 5.1 10*3/uL (ref 1.4–7.0)
Neutrophils: 62 %
Platelets: 127 10*3/uL — ABNORMAL LOW (ref 150–450)
RBC: 3.48 x10E6/uL — ABNORMAL LOW (ref 4.14–5.80)
RDW: 12.9 % (ref 11.6–15.4)
WBC: 8.1 10*3/uL (ref 3.4–10.8)

## 2019-05-01 LAB — LIPID PANEL
Chol/HDL Ratio: 2.7 ratio (ref 0.0–5.0)
Cholesterol, Total: 147 mg/dL (ref 100–199)
HDL: 54 mg/dL (ref >39)
LDL Calculated: 73 mg/dL (ref 0–99)
Triglycerides: 102 mg/dL (ref 0–149)
VLDL Cholesterol Cal: 20 mg/dL (ref 5–40)

## 2019-05-01 LAB — TSH: TSH: 3.98 u[IU]/mL (ref 0.450–4.500)

## 2019-05-03 ENCOUNTER — Ambulatory Visit: Payer: Medicare Other | Admitting: Cardiovascular Disease

## 2019-05-06 ENCOUNTER — Telehealth: Payer: Self-pay

## 2019-05-06 NOTE — Telephone Encounter (Signed)
ATC patient to reschedule his AWV. The health coach is out of the office until July.  CRM created.

## 2019-05-08 ENCOUNTER — Other Ambulatory Visit: Payer: Self-pay

## 2019-05-08 ENCOUNTER — Encounter: Payer: Self-pay | Admitting: Cardiovascular Disease

## 2019-05-08 ENCOUNTER — Telehealth (INDEPENDENT_AMBULATORY_CARE_PROVIDER_SITE_OTHER): Payer: Medicare Other | Admitting: Cardiovascular Disease

## 2019-05-08 VITALS — BP 143/67 | HR 68 | Ht 61.0 in | Wt 128.0 lb

## 2019-05-08 DIAGNOSIS — I480 Paroxysmal atrial fibrillation: Secondary | ICD-10-CM

## 2019-05-08 DIAGNOSIS — I359 Nonrheumatic aortic valve disorder, unspecified: Secondary | ICD-10-CM | POA: Diagnosis not present

## 2019-05-08 DIAGNOSIS — I2510 Atherosclerotic heart disease of native coronary artery without angina pectoris: Secondary | ICD-10-CM

## 2019-05-08 DIAGNOSIS — I5042 Chronic combined systolic (congestive) and diastolic (congestive) heart failure: Secondary | ICD-10-CM

## 2019-05-08 NOTE — Progress Notes (Signed)
Virtual Visit via Telephone Note   This visit type was conducted due to national recommendations for restrictions regarding the COVID-19 Pandemic (e.g. social distancing) in an effort to limit this patient's exposure and mitigate transmission in our community.  Due to his co-morbid illnesses, this patient is at least at moderate risk for complications without adequate follow up.  This format is felt to be most appropriate for this patient at this time.  The patient did not have access to video technology/had technical difficulties with video requiring transitioning to audio format only (telephone).  All issues noted in this document were discussed and addressed.  No physical exam could be performed with this format.  Please refer to the patient's chart for his  consent to telehealth for [facility].   Date:  05/08/2019   ID:  Jesus [name], DOB 18-Sep-1941, MRN [id number]  Patient Location: Home Provider Location: Home  PCP:  [name], MD  Cardiologist:  [name], MD  Electrophysiologist:  None   Evaluation Performed:  Follow-Up Visit  Chief Complaint: Follow-up aortic valve disease.  History of Present Illness:    Jesus [name] is a 78 y.o. male with hx of CAD, remote MI, and aortic valve disease, presenting for follow-up evaluation via telephone in light of the current COVID-19 pandemic.  The patient is doing reasonably well at present.  He has recently gotten back out to do some yard work and had no exertional symptoms.  He denies chest pain, chest pressure, shortness of breath, or heart palpitations.  He had recent lab work which is reviewed with him in detail today.  He had a recent echocardiogram as well.  This demonstrated stable LV systolic dysfunction with an LVEF of 45%, reflective of his past inferolateral infarct.  His TAVR bioprosthesis is functioning normally with a mean transvalvular gradient of 18 mmHg.  This is unchanged from his 2018 study.  The  patient does not have symptoms concerning for COVID-19 infection (fever, chills, cough, or new shortness of breath).    Past Medical History:  Diagnosis Date  . 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 Aortic stenosis    mild with a mean aortic valve gradient of 12 mmHg  . Atrial fibrillation (HCC)    holding sinus rhythm on Amiodarone  . CAD (coronary artery disease)    a. S/P Ant MI 1980;  b. 1997 S/P CABG x 8 (LIMA to diag-LAD, SVG to OM1-OM2-OM3, SVG to [facility] - Dr [name]);  c. 01/2015 Cath: 3VD, 8/8 patent grafts.  . Cardiomyopathy   . Dysrhythmia   . Esophageal reflux   . GERD (gastroesophageal reflux disease)   . Headache   . History of colonoscopy   . History of transesophageal echocardiography (TEE) for monitoring   . Other and unspecified hyperlipidemia   . S/P TAVR (transcatheter aortic valve replacement)    a. 03/2015 26 mm Edwards Sapien 3 transcatheter heart valve placed via open left transfemoral approach.  . Skin lesions, generalized    facial which [name] represent actinic keratoses and possible photosensitivity from Amiodarone  . Unspecified essential hypertension    Past Surgical History:  Procedure Laterality Date  . CARDIAC CATHETERIZATION    . CARDIOVERSION  11/18/2006   Dr. [name]  . CATARACT EXTRACTION W/ INTRAOCULAR LENS IMPLANT  April '13  (Dr. [name])   left eye only  . CHOLECYSTECTOMY    . CORONARY ARTERY BYPASS GRAFT  02/09/1996   LIMA to diag-LAD, SVG to  OM1-OM2-OM3, SVG to [facility]  . EYE SURGERY    . LEFT AND RIGHT HEART CATHETERIZATION WITH CORONARY/GRAFT ANGIOGRAM N/A 01/26/2015   Procedure: LEFT AND RIGHT HEART CATHETERIZATION WITH [name];  Surgeon: [name], MD;  Location: [facility] CATH LAB;  Service: Cardiovascular;  Laterality: N/A;  . TEE WITHOUT CARDIOVERSION N/A 03/10/2015   Procedure: TRANSESOPHAGEAL ECHOCARDIOGRAM (TEE);  Surgeon: [name], MD;  Location: [place];  Service: Open Heart  Surgery;  Laterality: N/A;  . TONSILLECTOMY    . TRANSCATHETER AORTIC VALVE REPLACEMENT, TRANSFEMORAL N/A 03/10/2015   Procedure: TRANSCATHETER AORTIC VALVE REPLACEMENT, TRANSFEMORAL;  Surgeon: [name], MD;  Location: [place];  Service: Open Heart Surgery;  Laterality: N/A;     Current Meds  Medication Sig  . acetaminophen (TYLENOL) 500 MG tablet Take 500 mg by mouth every 6 (six) hours as needed for moderate pain.  [name] albuterol (PROVENTIL HFA;VENTOLIN HFA) 108 (90 Base) MCG/ACT inhaler Inhale 2 puffs into the lungs every 6 (six) hours as needed for wheezing or shortness of breath.  [name] amiodarone (PACERONE) 200 MG tablet TAKE 1/2 TABLET BY MOUTH DAILY  . amLODipine (NORVASC) 5 MG tablet TAKE 1 TABLET BY MOUTH DAILY  . amoxicillin (AMOXIL) 400 MG/5ML suspension take 5 teaspoons one hour prior to dental procedure (dose should be amoxicillin 2 gm one hour prior to procedure)  . Ascorbic Acid (VITAMIN C) 1000 MG tablet Take 1,000 mg by mouth daily.    [name] aspirin 81 MG tablet Take 81 mg by mouth daily.    [name] atorvastatin (LIPITOR) 80 MG tablet TAKE 1/2 TABLET BY MOUTH DAILY  . loratadine (CLARITIN) 10 MG tablet Take 10 mg by mouth daily as needed for allergies.   [name] losartan (COZAAR) 100 MG tablet TAKE 1 TABLET BY MOUTH ONCE DAILY  . Multiple Vitamins-Minerals (MULTIVITAMIN,TX-MINERALS) tablet Take 1 tablet by mouth daily.    [name] warfarin (COUMADIN) 5 MG tablet Take 1/2 tablet daily except 1 tablet on Mon Wed and Fridays or TAKE AS DIRECTED BY ANTICOAGULATION CLINIC  90 DAY     Allergies:   Codeine and Iodine   Social History   Tobacco Use  . Smoking status: Never Smoker  . Smokeless tobacco: Never Used  . Tobacco comment: Does not smoke.  Substance Use Topics  . Alcohol use: No    Alcohol/week: 0.0 standard drinks  . Drug use: No     Family Hx: The patient's family history includes Atrial fibrillation in his brother; Crohn's disease in his mother; Heart attack (age of onset: 19) in his  father; Heart disease in his father and paternal uncle; Heart failure in his father; Prostate cancer in his paternal uncle; Stroke (age of onset: 16) in his mother. There is no history of Colon cancer.  ROS:   Please see the history of present illness.    All other systems reviewed and are negative.   Prior CV studies:   The following studies were reviewed today:  Echo 05/01/2019: IMPRESSIONS    1. Severe akinesis of the left ventricular, entire inferior wall and lateral wall.  2. The left ventricle has mild-moderately reduced systolic function, with an ejection fraction of 40-45%. The cavity size was normal. Left ventricular diastolic Doppler parameters are consistent with impaired relaxation. Elevated left atrial and left  ventricular end-diastolic pressures The E/e' is >15.  3. The right ventricle has normal systolic function. The cavity was normal. There is no increase in right ventricular wall thickness.  4. A 26 Edwards Sapien  bioprosthetic aortic valve (TAVR) valve is present in the aortic position. Procedure Date: 03/2015.  5. Left atrial size was severely dilated.  6. The mitral valve is abnormal. Mild thickening of the mitral valve leaflet. There is mild mitral annular calcification present.  7. The tricuspid valve is grossly normal.  8. When compared to the prior study: 03/2017: LVEF 40%, TAVR mean gradient 18 mmHg.  SUMMARY   LVEF 40-45%, inferior and lateral wall akinesis, grade 1 DD, elevated LV filling pressure, s/p 26 mm Edwards sapien TAVR with mean gradient of 17 mmHg, no paravalvular leak, severe LAE, trivial TR, normal RVSP, normal IVC  FINDINGS  Left Ventricle: The left ventricle has mild-moderately reduced systolic function, with an ejection fraction of 40-45%. The cavity size was normal. There is no increase in left ventricular wall thickness. Left ventricular diastolic Doppler parameters are  consistent with impaired relaxation. Elevated left atrial and left  ventricular end-diastolic pressures The E/e' is >15. Severe akinesis of the left ventricular, entire inferior wall and lateral wall.  Right Ventricle: The right ventricle has normal systolic function. The cavity was normal. There is no increase in right ventricular wall thickness.  Left Atrium: Left atrial size was severely dilated.  Right Atrium: Right atrial size was normal in size. Right atrial pressure is estimated at 3 mmHg.  Interatrial Septum: No atrial level shunt detected by color flow Doppler.  Pericardium: There is no evidence of pericardial effusion.  Mitral Valve: The mitral valve is abnormal. Mild thickening of the mitral valve leaflet. There is mild mitral annular calcification present. Mitral valve regurgitation is trivial by color flow Doppler.  Tricuspid Valve: The tricuspid valve is grossly normal. Tricuspid valve regurgitation is trivial by color flow Doppler.  Aortic Valve: The aortic valve has been repaired/replaced Aortic valve regurgitation was not visualized by color flow Doppler. A 26 Edwards Sapien bioprosthetic, stented aortic valve (TAVR) valve is present in the aortic position. Procedure Date:  03/2015.  Pulmonic Valve: The pulmonic valve was grossly normal. Pulmonic valve regurgitation is trivial by color flow Doppler.  Venous: The inferior vena cava measures 1.80 cm, is normal in size with greater than 50% respiratory variability.  Compared to previous exam: 03/2017: LVEF 40%, TAVR mean gradient 18 mmHg.  Labs/Other Tests and Data Reviewed:    EKG:  An ECG dated 04/16/2018 was personally reviewed today and demonstrated:  NSR 59 bpm, age-indeterminate inferior infarct  Recent Labs: 05/01/2019: ALT 22; BUN 17; Creatinine, Ser 0.91; Hemoglobin 11.9; Platelets 127; Potassium 4.0; Sodium 138; TSH 3.980   Recent Lipid Panel Lab Results  Component Value Date/Time   CHOL 147 05/01/2019 09:03 AM   TRIG 102 05/01/2019 09:03 AM   TRIG 67 11/20/2006  07:48 AM   HDL 54 05/01/2019 09:03 AM   CHOLHDL 2.7 05/01/2019 09:03 AM   CHOLHDL 3.0 09/12/2016 01:11 PM   LDLCALC 73 05/01/2019 09:03 AM    Wt Readings from Last 3 Encounters:  05/08/19 128 lb (58.1 kg)  02/16/19 127 lb (57.6 kg)  10/30/18 132 lb 12.8 oz (60.2 kg)     Objective:    Vital Signs:  BP (!) 143/67 (BP Location: Right Arm, Patient Position: Sitting, Cuff Size: Normal)   Pulse 68   Ht 5\' 1"  (1.549 m)   Wt 128 lb (58.1 kg)   BMI 24.19 kg/m    VITAL SIGNS:  reviewed Alert, oriented, in NAD. Breathing comfortably in normal conversation.  ASSESSMENT & PLAN:    1. Aortic valve disease, S/P  TAVR: echo reviewed with stable findings compared to 2018 echo study. Mean gradient 18 mmHg and trivial paravalvular regurgitation. Doing well on current Rx.  2. Treated with aspirin for antiplatelet therapy and warfarin in the setting of paroxysmal atrial fibrillation. 3. Paroxysmal atrial fibrillation: Maintaining sinus rhythm on amiodarone.  Anticoagulated with warfarin.  Recent amiodarone monitoring labs are reviewed with normal liver and thyroid function noted. 4. Coronary artery disease, native vessel, without angina: Treated with aspirin for antiplatelet therapy as well as atorvastatin 80 mg daily. 5. Essential hypertension: Blood pressure well controlled on current medical program.  COVID-19 Education: The signs and symptoms of COVID-19 were discussed with the patient and how to seek care for testing (follow up with PCP or arrange E-visit). The importance of social distancing was discussed today.  Time:   Today, I have spent 20 minutes with the patient with telehealth technology discussing the above problems.     Medication Adjustments/Labs and Tests Ordered: Current medicines are reviewed at length with the patient today.  Concerns regarding medicines are outlined above.   Tests Ordered: No orders of the defined types were placed in this encounter.   Medication  Changes: No orders of the defined types were placed in this encounter.   Disposition:  Follow up in 6 month(s)  Signed, [name], MD  05/08/2019 1:36 PM    [facility]

## 2019-05-10 ENCOUNTER — Ambulatory Visit: Payer: Medicare Other

## 2019-05-14 ENCOUNTER — Ambulatory Visit: Payer: Medicare Other | Admitting: Family Medicine

## 2019-05-14 ENCOUNTER — Ambulatory Visit: Payer: Medicare Other

## 2019-05-21 ENCOUNTER — Other Ambulatory Visit: Payer: Self-pay | Admitting: Family Medicine

## 2019-05-22 ENCOUNTER — Telehealth: Payer: Self-pay | Admitting: Family Medicine

## 2019-05-22 MED ORDER — AMOXICILLIN 400 MG/5ML PO SUSR: ORAL | 0 refills | Status: DC

## 2019-05-22 NOTE — Telephone Encounter (Signed)
OK to fill for dental procedure?

## 2019-05-22 NOTE — Telephone Encounter (Signed)
Medication: amoxicillin (AMOXIL) 400 MG/5ML suspension   Patient has a dental appointment on 05/23/2019 in the morning.   Has the patient contacted their pharmacy? Yes.   (Agent: If no, request that the patient contact the pharmacy for the refill.) (Agent: If yes, when and what did the pharmacy advise?)  Preferred Pharmacy (with phone number or street name): [place], [place] [phone] (Phone) [phone] (Fax)    Agent: Please be advised that RX refills may take up to 3 business days. We ask that you follow-up with your pharmacy.

## 2019-05-22 NOTE — Telephone Encounter (Signed)
I already refilled earlier today.

## 2019-05-23 NOTE — Telephone Encounter (Signed)
Rx was sent yesterday. Nothing further needed.

## 2019-05-24 ENCOUNTER — Ambulatory Visit: Payer: Medicare Other

## 2019-05-24 ENCOUNTER — Telehealth: Payer: Self-pay

## 2019-05-24 ENCOUNTER — Other Ambulatory Visit (INDEPENDENT_AMBULATORY_CARE_PROVIDER_SITE_OTHER): Payer: Medicare Other

## 2019-05-24 DIAGNOSIS — Z7901 Long term (current) use of anticoagulants: Secondary | ICD-10-CM | POA: Diagnosis not present

## 2019-05-24 LAB — PROTIME-INR
INR: 1.6 ratio — ABNORMAL HIGH (ref 0.8–1.0)
Prothrombin Time: 18.1 s — ABNORMAL HIGH (ref 9.6–13.1)

## 2019-05-27 ENCOUNTER — Ambulatory Visit (INDEPENDENT_AMBULATORY_CARE_PROVIDER_SITE_OTHER): Payer: Medicare Other | Admitting: General Practice

## 2019-05-27 ENCOUNTER — Other Ambulatory Visit: Payer: Self-pay | Admitting: Family Medicine

## 2019-05-27 DIAGNOSIS — Z952 Presence of prosthetic heart valve: Secondary | ICD-10-CM

## 2019-05-27 DIAGNOSIS — Z7901 Long term (current) use of anticoagulants: Secondary | ICD-10-CM

## 2019-05-27 DIAGNOSIS — I482 Chronic atrial fibrillation, unspecified: Secondary | ICD-10-CM

## 2019-05-27 NOTE — Patient Instructions (Signed)
Pre visit review using our clinic review tool, if applicable. No additional management support is needed unless otherwise documented below in the visit note.  Take 1 tablet today (6/22) and then continue to take 1 tablet daily except 1/2 tablet on Mon/Wed/Friday.  Re-check in 3 to 4 weeks.  Dosing instructions have been given to patient and he verbalized understanding.

## 2019-06-10 DIAGNOSIS — S01112A Laceration without foreign body of left eyelid and periocular area, initial encounter: Secondary | ICD-10-CM | POA: Diagnosis not present

## 2019-06-17 ENCOUNTER — Other Ambulatory Visit: Payer: Self-pay | Admitting: Cardiovascular Disease

## 2019-06-17 ENCOUNTER — Other Ambulatory Visit: Payer: Self-pay | Admitting: Family Medicine

## 2019-06-21 ENCOUNTER — Other Ambulatory Visit: Payer: Self-pay

## 2019-06-21 ENCOUNTER — Ambulatory Visit (INDEPENDENT_AMBULATORY_CARE_PROVIDER_SITE_OTHER): Payer: Medicare Other | Admitting: General Practice

## 2019-06-21 DIAGNOSIS — Z952 Presence of prosthetic heart valve: Secondary | ICD-10-CM

## 2019-06-21 DIAGNOSIS — Z7901 Long term (current) use of anticoagulants: Secondary | ICD-10-CM | POA: Diagnosis not present

## 2019-06-21 DIAGNOSIS — I482 Chronic atrial fibrillation, unspecified: Secondary | ICD-10-CM

## 2019-06-21 LAB — POCT INR: INR: 3.1 — AB (ref 2.0–3.0)

## 2019-06-21 NOTE — Patient Instructions (Addendum)
Pre visit review using our clinic review tool, if applicable. No additional management support is needed unless otherwise documented below in the visit note.  Skip dosage tomorrow (7/18) and then continue to take 1 tablet daily except 1/2 tablet on Mon/Wed/Friday.  Re-check in 3 to 4 weeks.  Dosing instructions have been given to patient and he verbalized understanding.

## 2019-06-24 ENCOUNTER — Other Ambulatory Visit: Payer: Self-pay | Admitting: Family Medicine

## 2019-06-24 DIAGNOSIS — Z7901 Long term (current) use of anticoagulants: Secondary | ICD-10-CM

## 2019-07-01 DIAGNOSIS — S01112D Laceration without foreign body of left eyelid and periocular area, subsequent encounter: Secondary | ICD-10-CM | POA: Diagnosis not present

## 2019-07-19 ENCOUNTER — Ambulatory Visit: Payer: Medicare Other

## 2019-07-19 ENCOUNTER — Ambulatory Visit (INDEPENDENT_AMBULATORY_CARE_PROVIDER_SITE_OTHER): Payer: Medicare Other | Admitting: General Practice

## 2019-07-19 ENCOUNTER — Other Ambulatory Visit: Payer: Self-pay

## 2019-07-19 DIAGNOSIS — Z7901 Long term (current) use of anticoagulants: Secondary | ICD-10-CM | POA: Diagnosis not present

## 2019-07-19 DIAGNOSIS — I482 Chronic atrial fibrillation, unspecified: Secondary | ICD-10-CM

## 2019-07-19 DIAGNOSIS — Z952 Presence of prosthetic heart valve: Secondary | ICD-10-CM

## 2019-07-19 LAB — POCT INR: INR: 2.7 (ref 2.0–3.0)

## 2019-07-19 NOTE — Patient Instructions (Signed)
Pre visit review using our clinic review tool, if applicable. No additional management support is needed unless otherwise documented below in the visit note. 

## 2019-08-17 ENCOUNTER — Other Ambulatory Visit: Payer: Self-pay

## 2019-08-17 ENCOUNTER — Telehealth (INDEPENDENT_AMBULATORY_CARE_PROVIDER_SITE_OTHER): Payer: Medicare Other | Admitting: Family Medicine

## 2019-08-17 DIAGNOSIS — L298 Other pruritus: Secondary | ICD-10-CM | POA: Diagnosis not present

## 2019-08-17 MED ORDER — TRIAMCINOLONE ACETONIDE 0.1 % EX CREA: 1.0000 "application " | TOPICAL_CREAM | Freq: Two times a day (BID) | CUTANEOUS | 0 refills | Status: AC

## 2019-08-17 NOTE — Progress Notes (Signed)
Virtual Visit via Telephone Note  I connected with Jesus [name] on 08/17/19 at 10:00 AM EDT by telephone and verified that I am speaking with the correct person using two identifiers.   I discussed the limitations, risks, security and privacy concerns of performing an evaluation and management service by telephone and the availability of in person appointments. I also discussed with the patient that there [name] be a patient responsible charge related to this service. The patient expressed understanding and agreed to proceed.  Location patient: home Location provider: work office Participants present for the call: patient, provider Patient did not have a visit in the prior 7 days to address this/these issue(s).   History of Present Illness: [name] is a 78 year old male with history of hypertension, CAD, valvular disease s/p TAVR ,atrial fibrillation on Coumadin who is complaining about 3 days of pruritic rash on distal aspect of left lower extremity, above ankle. He thinks problem is caused by chigger bites. States that he has "more than 20" pruritic lesions. He has applied topical Benadryl, which has helped with pruritus.  He denies sick contact. Lesions are not tender. Negative for left knee or ankle erythema or edema. He denies fever, chills, change in appetite, unusual fatigue, or body aches.   Observations/Objective: Patient sounds cheerful and well on the phone. I do not appreciate any SOB. Speech and thought processing are grossly intact. Patient reported vitals:N/A  Assessment and Plan:  Pruritic erythematous rash Explained that it is difficult to determine possible etiologies without been able to inspect lesions. History does not suggest a serious process. Recommend preventing insect bites, he can apply repellent when working outdoors and boots. Keep lesions clean with soap and water and avoid scratching. Topical steroid twice daily for up to 14 days  recommended. Instructed about warning signs. Recommend follow-up with PCP if not greatly improvement in 5 to 7 days.  - triamcinolone cream (KENALOG) 0.1 %; Apply 1 application topically 2 (two) times daily for 14 days.  Dispense: 45 g; Refill: 0  Follow Up Instructions:  Return if symptoms worsen or fail to improve.  I did not refer this patient for an OV in the next 24 hours for this/these issue(s).  I discussed the assessment and treatment plan with the patient. He was provided an opportunity to ask questions and all were answered. He agreed with the plan and demonstrated an understanding of the instructions.   The patient was advised to call back or seek an in-person evaluation if the symptoms worsen or if the condition fails to improve as anticipated.  I provided 12 minutes of non-face-to-face time during this encounter.   Betty Martinique, MD

## 2019-08-20 ENCOUNTER — Other Ambulatory Visit: Payer: Self-pay | Admitting: General Practice

## 2019-08-20 ENCOUNTER — Other Ambulatory Visit: Payer: Self-pay

## 2019-08-20 ENCOUNTER — Ambulatory Visit (INDEPENDENT_AMBULATORY_CARE_PROVIDER_SITE_OTHER): Payer: Medicare Other | Admitting: General Practice

## 2019-08-20 DIAGNOSIS — I482 Chronic atrial fibrillation, unspecified: Secondary | ICD-10-CM

## 2019-08-20 DIAGNOSIS — L298 Other pruritus: Secondary | ICD-10-CM

## 2019-08-20 DIAGNOSIS — Z7901 Long term (current) use of anticoagulants: Secondary | ICD-10-CM

## 2019-08-20 DIAGNOSIS — Z952 Presence of prosthetic heart valve: Secondary | ICD-10-CM

## 2019-08-20 LAB — POCT INR: INR: 2.9 (ref 2.0–3.0)

## 2019-08-20 NOTE — Patient Instructions (Addendum)
Pre visit review using our clinic review tool, if applicable. No additional management support is needed unless otherwise documented below in the visit note.  Continue to take 1 tablet daily except 1/2 tablet on Mon/Wed/Friday.  Re-check in 6 weeks.    

## 2019-08-26 ENCOUNTER — Telehealth: Payer: Self-pay | Admitting: Family Medicine

## 2019-08-26 NOTE — Telephone Encounter (Signed)
Patient is calling to report that 2 days ago the patient saw blood in his stool. And the last 2 stools there has been no blood.  The patient also wanted to report the his coumadin was 2.9.  What should his do?   Please advise CB- [phone]

## 2019-08-26 NOTE — Telephone Encounter (Signed)
Called patient and gave him message from Dr. Elease Hashimoto. Patient stated that if he saw more blood then he will call us to schedule an in office appointment. He will await call from [facility] to see what he needs to do about his Coumadin.

## 2019-08-26 NOTE — Telephone Encounter (Signed)
Please see message.  Please advise. 

## 2019-08-26 NOTE — Telephone Encounter (Signed)
If further episodes of blood, set up office follow up.  Follow instructions per [facility] regarding coumadin dosing.

## 2019-08-27 NOTE — Telephone Encounter (Signed)
Noted.  Patient contacted and given instructions to make OV with Dr. Elease Hashimoto if he has further blood in stool.  Instructed pt to go to ER if actively bleeding.

## 2019-09-02 ENCOUNTER — Ambulatory Visit: Payer: Self-pay | Admitting: Family Medicine

## 2019-09-02 ENCOUNTER — Other Ambulatory Visit: Payer: Self-pay

## 2019-09-02 NOTE — Telephone Encounter (Signed)
Pt has been scheduled for OV

## 2019-09-02 NOTE — Telephone Encounter (Signed)
Pt reports bright red blood in stool this AM. States had 2 BMs today, "Only in 2nd one." Had blood in stool 08/26/2019; advised to CB if reoccurred. States has not had any bleeding since that date until today. Denies any abdominal pain, no nausea or vomiting. States blood was "Like a few strings on top of stool." denies constipation, "Soft BM."  Reports one episode of dizziness last week, none since.   Pt is on Coumadin, alternating doses. Last INR 08/20/2019 at 2.9.   Pt transferred to practice, [facility], for consideration of appt.  Care advise given to pt; verbalizes understanding.  Reason for Disposition . MILD rectal bleeding (more than just a few drops or streaks)  Protocols used: RECTAL BLEEDING-A-AH

## 2019-09-02 NOTE — Telephone Encounter (Signed)
   Answer Assessment - Initial Assessment Questions 1. APPEARANCE of BLOOD: "What color is it?" "Is it passed separately, on the surface of the stool, or mixed in with the stool?"     "Red like cranberries" 2. AMOUNT: "How much blood was passed?"      "On stool, like strings" 3. FREQUENCY: "How many times has blood been passed with the stools?"      1 today. Had 2 BMS today, only one had blood 4. ONSET: "When was the blood first seen in the stools?" (Days or weeks)      9/21. Not since 5. DIARRHEA: "Is there also some diarrhea?" If so, ask: "How many diarrhea stools were passed in past 24 hours?"      No, soft stool 6. CONSTIPATION: "Do you have constipation?" If so, "How bad is it?"     no 7. RECURRENT SYMPTOMS: "Have you had blood in your stools before?" If so, ask: "When was the last time?" and "What happened that time?"      On 9/21. 8. BLOOD THINNERS: "Do you take any blood thinners?" (e.g., Coumadin/warfarin, Pradaxa/dabigatran, aspirin)     Coumadin 9. OTHER SYMPTOMS: "Do you have any other symptoms?"  (e.g., abdominal pain, vomiting, dizziness, fever)     Last night, cramping, brief. Dizzy last week, one time.  Protocols used: RECTAL BLEEDING-A-AH

## 2019-09-03 ENCOUNTER — Ambulatory Visit (INDEPENDENT_AMBULATORY_CARE_PROVIDER_SITE_OTHER): Payer: Medicare Other | Admitting: Family Medicine

## 2019-09-03 ENCOUNTER — Encounter: Payer: Self-pay | Admitting: Family Medicine

## 2019-09-03 ENCOUNTER — Other Ambulatory Visit: Payer: Self-pay

## 2019-09-03 VITALS — BP 140/52 | HR 71 | Temp 98.0°F | Wt 133.3 lb

## 2019-09-03 DIAGNOSIS — L720 Epidermal cyst: Secondary | ICD-10-CM

## 2019-09-03 DIAGNOSIS — K921 Melena: Secondary | ICD-10-CM

## 2019-09-03 LAB — CBC WITH DIFFERENTIAL/PLATELET
Basophils Absolute: 0.1 10*3/uL (ref 0.0–0.1)
Basophils Relative: 0.9 % (ref 0.0–3.0)
Eosinophils Absolute: 0.1 10*3/uL (ref 0.0–0.7)
Eosinophils Relative: 1.2 % (ref 0.0–5.0)
HCT: 38.6 % — ABNORMAL LOW (ref 39.0–52.0)
Hemoglobin: 12.9 g/dL — ABNORMAL LOW (ref 13.0–17.0)
Lymphocytes Relative: 19.7 % (ref 12.0–46.0)
Lymphs Abs: 1.2 10*3/uL (ref 0.7–4.0)
MCHC: 33.3 g/dL (ref 30.0–36.0)
MCV: 99.1 fl (ref 78.0–100.0)
Monocytes Absolute: 0.7 10*3/uL (ref 0.1–1.0)
Monocytes Relative: 10.8 % (ref 3.0–12.0)
Neutro Abs: 4.2 10*3/uL (ref 1.4–7.7)
Neutrophils Relative %: 67.4 % (ref 43.0–77.0)
Platelets: 133 10*3/uL — ABNORMAL LOW (ref 150.0–400.0)
RBC: 3.89 Mil/uL — ABNORMAL LOW (ref 4.22–5.81)
RDW: 13.5 % (ref 11.5–15.5)
WBC: 6.2 10*3/uL (ref 4.0–10.5)

## 2019-09-03 NOTE — Progress Notes (Signed)
Subjective:     Patient ID: Jesus [name], male   DOB: 17-Oct-1941, 78 y.o.   MRN: [id number]  HPI Patient is seen for the following concerns  About a week ago he noticed bright red blood in his stool on a couple occasions.  Is not had any for the past few days.  He states this was bright and non-painful with bowel movements.  He has had some straining and constipation recently.  He does take Coumadin.  Recent INR 2.9 on the 15th of this month.  He had colonoscopy 2017 with 1 adenoma polyp and one hyperplastic polyp.  No dizziness.  No appetite or weight changes.  Other issues cystic nodular type lesion right upper back near the base of the neck.  Sometimes gets called on necklace.  Nonpainful.  Past Medical History:  Diagnosis Date  . 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 Aortic stenosis    mild with a mean aortic valve gradient of 12 mmHg  . Atrial fibrillation (HCC)    holding sinus rhythm on Amiodarone  . CAD (coronary artery disease)    a. S/P Ant MI 1980;  b. 1997 S/P CABG x 8 (LIMA to diag-LAD, SVG to OM1-OM2-OM3, SVG to [facility] - Dr [name]);  c. 01/2015 Cath: 3VD, 8/8 patent grafts.  . Cardiomyopathy   . Dysrhythmia   . Esophageal reflux   . GERD (gastroesophageal reflux disease)   . Headache   . History of colonoscopy   . History of transesophageal echocardiography (TEE) for monitoring   . Other and unspecified hyperlipidemia   . S/P TAVR (transcatheter aortic valve replacement)    a. 03/2015 26 mm Edwards Sapien 3 transcatheter heart valve placed via open left transfemoral approach.  . Skin lesions, generalized    facial which may represent actinic keratoses and possible photosensitivity from Amiodarone  . Unspecified essential hypertension    Past Surgical History:  Procedure Laterality Date  . CARDIAC CATHETERIZATION    . CARDIOVERSION  11/18/2006   Dr. [name]  . CATARACT EXTRACTION W/ INTRAOCULAR LENS IMPLANT  April '13  (Dr. [name])    left eye only  . CHOLECYSTECTOMY    . CORONARY ARTERY BYPASS GRAFT  02/09/1996   LIMA to diag-LAD, SVG to OM1-OM2-OM3, SVG to [facility]  . EYE SURGERY    . LEFT AND RIGHT HEART CATHETERIZATION WITH CORONARY/GRAFT ANGIOGRAM N/A 01/26/2015   Procedure: LEFT AND RIGHT HEART CATHETERIZATION WITH [name];  Surgeon: [name], MD;  Location: [facility] CATH LAB;  Service: Cardiovascular;  Laterality: N/A;  . TEE WITHOUT CARDIOVERSION N/A 03/10/2015   Procedure: TRANSESOPHAGEAL ECHOCARDIOGRAM (TEE);  Surgeon: [name], MD;  Location: [facility];  Service: Open Heart Surgery;  Laterality: N/A;  . TONSILLECTOMY    . TRANSCATHETER AORTIC VALVE REPLACEMENT, TRANSFEMORAL N/A 03/10/2015   Procedure: TRANSCATHETER AORTIC VALVE REPLACEMENT, TRANSFEMORAL;  Surgeon: [name], MD;  Location: [place];  Service: Open Heart Surgery;  Laterality: N/A;    reports that he has never smoked. He has never used smokeless tobacco. He reports that he does not drink alcohol or use drugs. family history includes Atrial fibrillation in his brother; Crohn's disease in his mother; Heart attack (age of onset: 27) in his father; Heart disease in his father and paternal uncle; Heart failure in his father; Prostate cancer in his paternal uncle; Stroke (age of onset: 29) in his mother. Allergies  Allergen Reactions  . Codeine Other (See Comments)    Pt does not remember  .  Iodine Swelling     Review of Systems  Constitutional: Negative for appetite change, chills, fever and unexpected weight change.  Respiratory: Negative for shortness of breath.   Gastrointestinal: Positive for blood in stool and constipation. Negative for abdominal pain, diarrhea, nausea, rectal pain and vomiting.  Neurological: Negative for dizziness.       Objective:   Physical Exam Constitutional:      Appearance: Normal appearance.  Cardiovascular:     Rate and Rhythm: Normal rate.  Pulmonary:     Effort: Pulmonary effort is  normal.     Breath sounds: Normal breath sounds.  Genitourinary:    Comments: He has significant amount of stool around the anal area.  No visible fissure noted.  No external hemorrhoids.  Digital exam reveals no mass.  Brown heme-negative stool Skin:    Comments: Patient has small approximately half centimeter cystic lesion with umbilicated center consistent with epidermal cyst.  No ulceration.  No pigment change.  Neurological:     Mental Status: He is alert.        Assessment:     #1 hematochezia last week but none over the past few days.  Hemoccult negative at this time.  Unremarkable exam.  He does take Coumadin and INRs have been therapeutic.  Prior history of adenomatous polyps as above.  He is not any red flags such as appetite or weight change.  Does have some associated constipation which may be contributing.  No known history of internal hemorrhoids  #2 benign epidermal cyst right upper back    Plan:     -Reassurance regarding cyst -Discussed measures to reduce constipation.  Increase fluid intake.  Goal of 25 to 30 g fiber per day.  Consider supplement with MiraLAX if necessary -Check CBC -Follow-up with Dr. Ardis Hughs if he has recurrent bloody stools or other concerns -He plans to get flu vaccine in October when he follows up for pro time  [name] MD [facility]

## 2019-09-03 NOTE — Patient Instructions (Addendum)
Epidermal Cyst  An epidermal cyst is a sac made of skin tissue. The sac contains a substance called keratin. Keratin is a protein that is normally secreted through the hair follicles. When keratin becomes trapped in the top layer of skin (epidermis), it can form an epidermal cyst. Epidermal cysts can be found anywhere on your body. These cysts are usually harmless (benign), and they may not cause symptoms unless they become infected. What are the causes? This condition may be caused by:  A blocked hair follicle.  A hair that curls and re-enters the skin instead of growing straight out of the skin (ingrown hair).  A blocked pore.  Irritated skin.  An injury to the skin.  Certain conditions that are passed along from parent to child (inherited).  Human papillomavirus (HPV).  Long-term (chronic) sun damage to the skin. What increases the risk? The following factors may make you more likely to develop an epidermal cyst:  Having acne.  Being overweight.  Being 30-40 years old. What are the signs or symptoms? The only symptom of this condition may be a small, painless lump underneath the skin. When an epidermal cyst ruptures, it may become infected. Symptoms may include:  Redness.  Inflammation.  Tenderness.  Warmth.  Fever.  Keratin draining from the cyst. Keratin is grayish-white, bad-smelling substance.  Pus draining from the cyst. How is this diagnosed? This condition is diagnosed with a physical exam.  In some cases, you may have a sample of tissue (biopsy) taken from your cyst to be examined under a microscope or tested for bacteria.  You may be referred to a health care provider who specializes in skin care (dermatologist). How is this treated? In many cases, epidermal cysts go away on their own without treatment. If a cyst becomes infected, treatment may include:  Opening and draining the cyst, done by a health care provider. After draining, minor surgery to  remove the rest of the cyst may be done.  Antibiotic medicine.  Injections of medicines (steroids) that help to reduce inflammation.  Surgery to remove the cyst. Surgery may be done if the cyst: ? Becomes large. ? Bothers you. ? Has a chance of turning into cancer.  Do not try to open a cyst yourself. Follow these instructions at home:  Take over-the-counter and prescription medicines only as told by your health care provider.  If you were prescribed an antibiotic medicine, take it it as told by your health care provider. Do not stop using the antibiotic even if you start to feel better.  Keep the area around your cyst clean and dry.  Wear loose, dry clothing.  Avoid touching your cyst.  Check your cyst every day for signs of infection. Check for: ? Redness, swelling, or pain. ? Fluid or blood. ? Warmth. ? Pus or a bad smell.  Keep all follow-up visits as told by your health care provider. This is important. How is this prevented?  Wear clean, dry, clothing.  Avoid wearing tight clothing.  Keep your skin clean and dry. Take showers or baths every day. Contact a health care provider if:  Your cyst develops symptoms of infection.  Your condition is not improving or is getting worse.  You develop a cyst that looks different from other cysts you have had.  You have a fever. Get help right away if:  Redness spreads from the cyst into the surrounding area. Summary  An epidermal cyst is a sac made of skin tissue. These cysts are   usually harmless (benign), and they may not cause symptoms unless they become infected.  If a cyst becomes infected, treatment may include surgery to open and drain the cyst, or to remove it. Treatment may also include medicines by mouth or through an injection.  Take over-the-counter and prescription medicines only as told by your health care provider. If you were prescribed an antibiotic medicine, take it as told by your health care  provider. Do not stop using the antibiotic even if you start to feel better.  Contact a health care provider if your condition is not improving or is getting worse.  Keep all follow-up visits as told by your health care provider. This is important. This information is not intended to replace advice given to you by your health care provider. Make sure you discuss any questions you have with your health care provider. Document Released: 10/22/2004 Document Revised: 03/14/2019 Document Reviewed: 06/04/2018 Elsevier Patient Education  [employer]. Constipation, Adult Constipation is when a person has fewer bowel movements in a week than normal, has difficulty having a bowel movement, or has stools that are dry, hard, or larger than normal. Constipation may be caused by an underlying condition. It may become worse with age if a person takes certain medicines and does not take in enough fluids. Follow these instructions at home: Eating and drinking   Eat foods that have a lot of fiber, such as fresh fruits and vegetables, whole grains, and beans.  Limit foods that are high in fat, low in fiber, or overly processed, such as french fries, hamburgers, cookies, candies, and soda.  Drink enough fluid to keep your urine clear or pale yellow. General instructions  Exercise regularly or as told by your health care provider.  Go to the restroom when you have the urge to go. Do not hold it in.  Take over-the-counter and prescription medicines only as told by your health care provider. These include any fiber supplements.  Practice pelvic floor retraining exercises, such as deep breathing while relaxing the lower abdomen and pelvic floor relaxation during bowel movements.  Watch your condition for any changes.  Keep all follow-up visits as told by your health care provider. This is important. Contact a health care provider if:  You have pain that gets worse.  You have a fever.  You do not  have a bowel movement after 4 days.  You vomit.  You are not hungry.  You lose weight.  You are bleeding from the anus.  You have thin, pencil-like stools. Get help right away if:  You have a fever and your symptoms suddenly get worse.  You leak stool or have blood in your stool.  Your abdomen is bloated.  You have severe pain in your abdomen.  You feel dizzy or you faint. This information is not intended to replace advice given to you by your health care provider. Make sure you discuss any questions you have with your health care provider. Document Released: 08/19/2004 Document Revised: 11/03/2017 Document Reviewed: 05/11/2016 Elsevier Patient Education  2020 [employer].

## 2019-09-09 ENCOUNTER — Telehealth: Payer: Self-pay

## 2019-09-09 NOTE — Telephone Encounter (Signed)
Copied from [place] [id number]. Topic: General - Other >> Sep 06, 2019  2:36 PM [name] wrote: Reason for CRM: Pt called and is requesting to know if he had the shingles inj 3 years ago. Please advise.

## 2019-09-11 NOTE — Telephone Encounter (Signed)
The patient had 1 shingles vaccine 12/14/2011. Does the patient need to have to 2 series vaccine?

## 2019-09-11 NOTE — Telephone Encounter (Signed)
The newer shingles vaccine is more efficacious and he should consider getting it.  He should check at pharmacy for cost if interested.

## 2019-09-13 NOTE — Telephone Encounter (Signed)
Pt went and got shingles vaccine last week at the pharmacy. Updated Immunizations. Pt aware they need to get the second vaccine.

## 2019-09-16 ENCOUNTER — Other Ambulatory Visit: Payer: Self-pay | Admitting: Family Medicine

## 2019-09-25 DIAGNOSIS — H52203 Unspecified astigmatism, bilateral: Secondary | ICD-10-CM | POA: Diagnosis not present

## 2019-09-25 DIAGNOSIS — H43813 Vitreous degeneration, bilateral: Secondary | ICD-10-CM | POA: Diagnosis not present

## 2019-09-25 DIAGNOSIS — H40052 Ocular hypertension, left eye: Secondary | ICD-10-CM | POA: Diagnosis not present

## 2019-09-25 DIAGNOSIS — Z961 Presence of intraocular lens: Secondary | ICD-10-CM | POA: Diagnosis not present

## 2019-10-04 ENCOUNTER — Ambulatory Visit (INDEPENDENT_AMBULATORY_CARE_PROVIDER_SITE_OTHER): Payer: Medicare Other | Admitting: General Practice

## 2019-10-04 ENCOUNTER — Other Ambulatory Visit: Payer: Self-pay

## 2019-10-04 DIAGNOSIS — I482 Chronic atrial fibrillation, unspecified: Secondary | ICD-10-CM

## 2019-10-04 DIAGNOSIS — Z7901 Long term (current) use of anticoagulants: Secondary | ICD-10-CM

## 2019-10-04 DIAGNOSIS — Z23 Encounter for immunization: Secondary | ICD-10-CM

## 2019-10-04 DIAGNOSIS — Z952 Presence of prosthetic heart valve: Secondary | ICD-10-CM

## 2019-10-04 LAB — POCT INR: INR: 2.5 (ref 2.0–3.0)

## 2019-10-04 NOTE — Patient Instructions (Addendum)
Pre visit review using our clinic review tool, if applicable. No additional management support is needed unless otherwise documented below in the visit note.  Continue to take 1 tablet daily except 1/2 tablet on Mon/Wed/Friday.  Re-check in 6 weeks.    

## 2019-10-04 NOTE — Progress Notes (Signed)
Medical screening examination/treatment/procedure(s) were performed by non-physician practitioner and as supervising physician I was immediately available for consultation/collaboration. I agree with above. Cymone Yeske, MD   

## 2019-10-16 ENCOUNTER — Encounter: Payer: Self-pay | Admitting: Family Medicine

## 2019-10-16 ENCOUNTER — Telehealth (INDEPENDENT_AMBULATORY_CARE_PROVIDER_SITE_OTHER): Payer: Medicare Other | Admitting: Family Medicine

## 2019-10-16 DIAGNOSIS — R059 Cough, unspecified: Secondary | ICD-10-CM

## 2019-10-16 DIAGNOSIS — R05 Cough: Secondary | ICD-10-CM

## 2019-10-16 DIAGNOSIS — R062 Wheezing: Secondary | ICD-10-CM

## 2019-10-16 MED ORDER — ALBUTEROL SULFATE HFA 108 (90 BASE) MCG/ACT IN AERS: 2.0000 | INHALATION_SPRAY | RESPIRATORY_TRACT | 1 refills | Status: DC | PRN

## 2019-10-16 NOTE — Progress Notes (Signed)
This visit type was conducted due to national recommendations for restrictions regarding the COVID-19 pandemic in an effort to limit this patient's exposure and mitigate transmission in our community.   Virtual Visit via Telephone Note  I connected with [name] on 10/16/19 at  2:45 PM EST by telephone and verified that I am speaking with the correct person using two identifiers.   I discussed the limitations, risks, security and privacy concerns of performing an evaluation and management service by telephone and the availability of in person appointments. I also discussed with the patient that there may be a patient responsible charge related to this service. The patient expressed understanding and agreed to proceed.  Location patient: home Location provider: work or home office Participants present for the call: patient, provider Patient did not have a visit in the prior 7 days to address this/these issue(s).   History of Present Illness: Roshon has history of aortic stenosis, prior aortic valve replacement, remote history of CABG, hyperlipidemia, GERD, hypertension.  He called with recent "squeaking "with expiration.  He denies any shortness of breath.  Walking without difficulties.  No orthopnea.  No fevers or chills.  Only rare cough.  No pleuritic pain.  No increased peripheral edema.  This does not impair his daily walking.  He denies any history of asthma.  Never smoked.  Has used albuterol inhaler in the past but not regularly.  He is not describing any stridor.  No dysphagia.  He does not have any sore throat, diarrhea, nasal congestion, body aches, or other symptoms to suggest Covid infection.  He has been very isolated.   Observations/Objective: Patient sounds cheerful and well on the phone. I do not appreciate any SOB. Speech and thought processing are grossly intact. Patient reported vitals:  Assessment and Plan:  Question mild wheezing versus pseudowheeze -We agreed to  refill his albuterol but not to use more than every 4-6 hours as needed -Follow-up immediately for any shortness of breath, fever, or other concerns  Follow Up Instructions:  -As needed if symptoms not relieved with the above   [id number] [id number] [id number] 11-20 [id number] 21-30 I did not refer this patient for an OV in the next 24 hours for this/these issue(s).  I discussed the assessment and treatment plan with the patient. The patient was provided an opportunity to ask questions and all were answered. The patient agreed with the plan and demonstrated an understanding of the instructions.   The patient was advised to call back or seek an in-person evaluation if the symptoms worsen or if the condition fails to improve as anticipated.  I provided 12 minutes of non-face-to-face time during this encounter.   [name], MD

## 2019-11-04 ENCOUNTER — Ambulatory Visit: Payer: Medicare Other | Admitting: Cardiovascular Disease

## 2019-11-04 ENCOUNTER — Other Ambulatory Visit: Payer: Self-pay

## 2019-11-04 ENCOUNTER — Ambulatory Visit
Admission: RE | Admit: 2019-11-04 | Discharge: 2019-11-04 | Disposition: A | Discharge status: Home / Self Care | Payer: Medicare Other | Source: Ambulatory Visit | Attending: Cardiovascular Disease | Admitting: Cardiovascular Disease

## 2019-11-04 ENCOUNTER — Encounter: Payer: Self-pay | Admitting: Cardiovascular Disease

## 2019-11-04 VITALS — BP 140/64 | HR 60 | Ht 62.0 in | Wt 133.8 lb

## 2019-11-04 DIAGNOSIS — I359 Nonrheumatic aortic valve disorder, unspecified: Secondary | ICD-10-CM | POA: Diagnosis not present

## 2019-11-04 DIAGNOSIS — I480 Paroxysmal atrial fibrillation: Secondary | ICD-10-CM | POA: Diagnosis not present

## 2019-11-04 DIAGNOSIS — I5042 Chronic combined systolic (congestive) and diastolic (congestive) heart failure: Secondary | ICD-10-CM | POA: Diagnosis not present

## 2019-11-04 DIAGNOSIS — I2510 Atherosclerotic heart disease of native coronary artery without angina pectoris: Secondary | ICD-10-CM

## 2019-11-04 DIAGNOSIS — R0602 Shortness of breath: Secondary | ICD-10-CM | POA: Diagnosis not present

## 2019-11-04 LAB — COMPREHENSIVE METABOLIC PANEL
ALT: 21 IU/L (ref 0–44)
AST: 25 IU/L (ref 0–40)
Albumin/Globulin Ratio: 1.8 (ref 1.2–2.2)
Albumin: 4.2 g/dL (ref 3.7–4.7)
Alkaline Phosphatase: 123 IU/L — ABNORMAL HIGH (ref 39–117)
BUN/Creatinine Ratio: 18 (ref 10–24)
BUN: 16 mg/dL (ref 8–27)
Bilirubin Total: 0.7 mg/dL (ref 0.0–1.2)
CO2: 23 mmol/L (ref 20–29)
Calcium: 9 mg/dL (ref 8.6–10.2)
Chloride: 103 mmol/L (ref 96–106)
Creatinine, Ser: 0.91 mg/dL (ref 0.76–1.27)
GFR calc Af Amer: 92 mL/min/{1.73_m2} (ref >59)
GFR calc non Af Amer: 79 mL/min/{1.73_m2} (ref >59)
Globulin, Total: 2.4 g/dL (ref 1.5–4.5)
Glucose: 89 mg/dL (ref 65–99)
Potassium: 4.5 mmol/L (ref 3.5–5.2)
Sodium: 138 mmol/L (ref 134–144)
Total Protein: 6.6 g/dL (ref 6.0–8.5)

## 2019-11-04 LAB — TSH: TSH: 4.23 u[IU]/mL (ref 0.450–4.500)

## 2019-11-04 NOTE — Progress Notes (Signed)
Cardiology Office Note:    Date:  11/04/2019   ID:  ZAMIR STAPLES, DOB 08/04/1941, MRN [id number]  PCP:  [name], MD  Cardiologist:  [name], MD  Electrophysiologist:  None   Referring MD: [name], MD   Chief Complaint  Patient presents with  . Coronary Artery Disease    History of Present Illness:    Jesus [name] is a 78 y.o. 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  The patient is here alone today.  He continues to do well, still working 5 days/week.  He works from 7:30 in the morning till noon.  He admits to mild shortness of breath with walking uphill or at a brisk pace.  He otherwise has no lifestyle limiting symptoms.  He has developed a cough that he experiences in the afternoons.  His cough is nonproductive.  He denies orthopnea, PND, leg swelling, or chest pain.  He has had no lightheadedness, heart palpitations, or syncope.  Past Medical History:  Diagnosis Date  . Acute myocardial infarction of inferior wall ([place]) 1980  . Aortic stenosis    mild with a mean aortic valve gradient of 12 mmHg  . Atrial fibrillation (HCC)    holding sinus rhythm on Amiodarone  . CAD (coronary artery disease)    a. S/P Ant MI 1980;  b. 1997 S/P CABG x 8 (LIMA to diag-LAD, SVG to OM1-OM2-OM3, SVG to [facility] - Dr [name]);  c. 01/2015 Cath: 3VD, 8/8 patent grafts.  . Cardiomyopathy   . Dysrhythmia   . Esophageal reflux   . GERD (gastroesophageal reflux disease)   . Headache   . History of colonoscopy   . History of transesophageal echocardiography (TEE) for monitoring   . Other and unspecified hyperlipidemia   . S/P TAVR (transcatheter aortic valve replacement)    a. 03/2015 26 mm Edwards Sapien 3 transcatheter heart valve placed via open left transfemoral  approach.  . Skin lesions, generalized    facial which may represent actinic keratoses and possible photosensitivity from Amiodarone  . Unspecified essential hypertension     Past Surgical History:  Procedure Laterality Date  . CARDIAC CATHETERIZATION    . CARDIOVERSION  11/18/2006   Dr. [name]  . CATARACT EXTRACTION W/ INTRAOCULAR LENS IMPLANT  April '13  (Dr. [name])   left eye only  . CHOLECYSTECTOMY    . CORONARY ARTERY BYPASS GRAFT  02/09/1996   LIMA to diag-LAD, SVG to OM1-OM2-OM3, SVG to [facility]  . EYE SURGERY    . LEFT AND RIGHT HEART CATHETERIZATION WITH CORONARY/GRAFT ANGIOGRAM N/A 01/26/2015   Procedure: LEFT AND RIGHT HEART CATHETERIZATION WITH [name];  Surgeon: [name], MD;  Location: [facility] CATH LAB;  Service: Cardiovascular;  Laterality: N/A;  . TEE WITHOUT CARDIOVERSION N/A 03/10/2015   Procedure: TRANSESOPHAGEAL ECHOCARDIOGRAM (TEE);  Surgeon: [name], MD;  Location: [facility];  Service: Open Heart Surgery;  Laterality: N/A;  . TONSILLECTOMY    . TRANSCATHETER AORTIC VALVE REPLACEMENT, TRANSFEMORAL N/A 03/10/2015   Procedure: TRANSCATHETER AORTIC VALVE REPLACEMENT, TRANSFEMORAL;  Surgeon: [name], MD;  Location: [facility];  Service: Open Heart Surgery;  Laterality: N/A;    Current Medications: Current Meds  Medication Sig  . acetaminophen (TYLENOL) 500 MG tablet Take 500 mg by mouth every 6 (six) hours as needed for moderate pain.  [name]  albuterol (VENTOLIN HFA) 108 (90 Base) MCG/ACT inhaler Inhale 2 puffs into the lungs every 4 (four) hours as needed for wheezing or shortness of breath.  [name] amiodarone (PACERONE) 200 MG tablet TAKE 1/2 TABLET BY MOUTH DAILY  . amLODipine (NORVASC) 5 MG tablet TAKE 1 TABLET BY MOUTH DAILY  . amoxicillin (AMOXIL) 400 MG/5ML suspension take 5 teaspoons one hour prior to dental procedure (dose should be amoxicillin 2 gm one hour prior to procedure)  . Ascorbic Acid (VITAMIN C) 1000 MG tablet Take 1,000 mg by  mouth daily.    [name] aspirin 81 MG tablet Take 81 mg by mouth daily.    [name] atorvastatin (LIPITOR) 80 MG tablet TAKE 1/2 TABLET BY MOUTH DAILY  . loratadine (CLARITIN) 10 MG tablet Take 10 mg by mouth daily as needed for allergies.   [name] losartan (COZAAR) 100 MG tablet TAKE 1 TABLET BY MOUTH ONCE DAILY  . Multiple Vitamins-Minerals (MULTIVITAMIN,TX-MINERALS) tablet Take 1 tablet by mouth daily.    [name] warfarin (COUMADIN) 5 MG tablet TAKE 1/2 TABLET BY MOUTH DAILY EXCEPT 1 TABLET ON MONDAY, WEDNESDAY, AND FRIDAYSOR TAKE AS DIRECTED BY ANTICOAGULATIOIN CLINIC     Allergies:   Codeine and Iodine   Social History   Socioeconomic History  . Marital status: Married    Spouse name: Not on file  . Number of children: Not on file  . Years of education: Not on file  . Highest education level: Not on file  Occupational History  . Not on file  Social Needs  . Financial resource strain: Not on file  . Food insecurity    Worry: Not on file    Inability: Not on file  . Transportation needs    Medical: Not on file    Non-medical: Not on file  Tobacco Use  . Smoking status: Never Smoker  . Smokeless tobacco: Never Used  . Tobacco comment: Does not smoke.  Substance and Sexual Activity  . Alcohol use: No    Alcohol/week: 0.0 standard drinks  . Drug use: No  . Sexual activity: Not Currently  Lifestyle  . Physical activity    Days per week: Not on file    Minutes per session: Not on file  . Stress: Not on file  Relationships  . Social Herbalist on phone: Not on file    Gets together: Not on file    Attends religious service: Not on file    Active member of club or organization: Not on file    Attends meetings of clubs or organizations: Not on file    Relationship status: Not on file  Other Topics Concern  . Not on file  Social History Narrative   HSG. [employer] 6 years. Married - '69 - 1 year/divorced. '76 - . No children. Work - mfg/textiles - Designer, multimedia;  currently works doing maintenance. [employer] - they have discussed this. Provided packet august '13.                  Family History: The patient's family history includes Atrial fibrillation in his brother; Crohn's disease in his mother; Heart attack (age of onset: 80) in his father; Heart disease in his father and paternal uncle; Heart failure in his father; Prostate cancer in his paternal uncle; Stroke (age of onset: 69) in his mother. There is no history of Colon cancer.  ROS:   Please see the history of present illness.    All other systems reviewed  and are negative.  EKGs/Labs/Other Studies Reviewed:    The following studies were reviewed today: Echo 05/01/2019: IMPRESSIONS    1. Severe akinesis of the left ventricular, entire inferior wall and lateral wall.  2. The left ventricle has mild-moderately reduced systolic function, with an ejection fraction of 40-45%. The cavity size was normal. Left ventricular diastolic Doppler parameters are consistent with impaired relaxation. Elevated left atrial and left  ventricular end-diastolic pressures The E/e' is >15.  3. The right ventricle has normal systolic function. The cavity was normal. There is no increase in right ventricular wall thickness.  4. A 26 Edwards Sapien bioprosthetic aortic valve (TAVR) valve is present in the aortic position. Procedure Date: 03/2015.  5. Left atrial size was severely dilated.  6. The mitral valve is abnormal. Mild thickening of the mitral valve leaflet. There is mild mitral annular calcification present.  7. The tricuspid valve is grossly normal.  8. When compared to the prior study: 03/2017: LVEF 40%, TAVR mean gradient 18 mmHg.  SUMMARY   LVEF 40-45%, inferior and lateral wall akinesis, grade 1 DD, elevated LV filling pressure, s/p 26 mm Edwards sapien TAVR with mean gradient of 17 mmHg, no paravalvular leak, severe LAE, trivial TR, normal RVSP, normal IVC  FINDINGS  Left Ventricle: The left  ventricle has mild-moderately reduced systolic function, with an ejection fraction of 40-45%. The cavity size was normal. There is no increase in left ventricular wall thickness. Left ventricular diastolic Doppler parameters are  consistent with impaired relaxation. Elevated left atrial and left ventricular end-diastolic pressures The E/e' is >15. Severe akinesis of the left ventricular, entire inferior wall and lateral wall.  Right Ventricle: The right ventricle has normal systolic function. The cavity was normal. There is no increase in right ventricular wall thickness.  Left Atrium: Left atrial size was severely dilated.  Right Atrium: Right atrial size was normal in size. Right atrial pressure is estimated at 3 mmHg.  Interatrial Septum: No atrial level shunt detected by color flow Doppler.  Pericardium: There is no evidence of pericardial effusion.  Mitral Valve: The mitral valve is abnormal. Mild thickening of the mitral valve leaflet. There is mild mitral annular calcification present. Mitral valve regurgitation is trivial by color flow Doppler.  Tricuspid Valve: The tricuspid valve is grossly normal. Tricuspid valve regurgitation is trivial by color flow Doppler.  Aortic Valve: The aortic valve has been repaired/replaced Aortic valve regurgitation was not visualized by color flow Doppler. A 26 Edwards Sapien bioprosthetic, stented aortic valve (TAVR) valve is present in the aortic position. Procedure Date:  03/2015.  Pulmonic Valve: The pulmonic valve was grossly normal. Pulmonic valve regurgitation is trivial by color flow Doppler.  Venous: The inferior vena cava measures 1.80 cm, is normal in size with greater than 50% respiratory variability.  Compared to previous exam: 03/2017: LVEF 40%, TAVR mean gradient 18 mmHg.   EKG:  EKG is ordered today.  The ekg ordered today demonstrates normal sinus rhythm 60 bpm, voltage criteria for LVH, age-indeterminate inferolateral  infarct.  No significant change from previous tracings.  Recent Labs: 05/01/2019: ALT 22; BUN 17; Creatinine, Ser 0.91; Potassium 4.0; Sodium 138; TSH 3.980 09/03/2019: Hemoglobin 12.9; Platelets 133.0  Recent Lipid Panel    Component Value Date/Time   CHOL 147 05/01/2019 0903   TRIG 102 05/01/2019 0903   TRIG 67 11/20/2006 0748   HDL 54 05/01/2019 0903   CHOLHDL 2.7 05/01/2019 0903   CHOLHDL 3.0 09/12/2016 1311   VLDL 24 09/12/2016  [place] 05/01/2019 0903    Physical Exam:    VS:  BP 140/64   Pulse 60   Ht _0  (1.575 m)   Wt 133 lb 12.8 oz (60.7 kg)   SpO2 98%   BMI 24.47 kg/m     Wt Readings from Last 3 Encounters:  11/04/19 133 lb 12.8 oz (60.7 kg)  09/03/19 133 lb 4.8 oz (60.5 kg)  05/08/19 128 lb (58.1 kg)     GEN:  Well nourished, well developed in no acute distress HEENT: Normal NECK: No JVD; No carotid bruits LYMPHATICS: No lymphadenopathy CARDIAC: RRR, 2/6 systolic murmur and 2/6 diastolic decrescendo murmur at the right upper sternal border RESPIRATORY: Inspiratory crackles in both bases ABDOMEN: Soft, non-tender, non-distended MUSCULOSKELETAL:  No edema; No deformity  SKIN: Warm and dry NEUROLOGIC:  Alert and oriented x 3 PSYCHIATRIC:  Normal affect   ASSESSMENT:    1. Aortic valve disorder   2. Coronary artery disease involving native coronary artery of native heart without angina pectoris   3. Paroxysmal atrial fibrillation (HCC)   4. Chronic combined systolic and diastolic CHF (congestive heart failure) (HCC)    PLAN:    In order of problems listed above:  1. Patient is status post TAVR.  He has had essentially normal bioprosthetic TAVR function on previous echo studies with mild paravalvular regurgitation noted.  We will repeat his echocardiogram when he returns for follow-up in 6 months. 2. Continue current therapy.  No anginal symptoms.  Treated with aspirin and a high intensity statin drug.  Not on a beta-blocker because of  bradycardia on amiodarone. 3. Maintaining sinus rhythm on low-dose amiodarone.  Will update amiodarone monitoring labs with a TSH and LFTs.  We will check a chest x-ray with his mild pulmonary symptoms and abnormal pulmonary exam.  Anticoagulated with warfarin without bleeding problems. 4. New York Heart Association functional class II symptoms.  Repeat echo in 6 months.  Continue current medical program.   Medication Adjustments/Labs and Tests Ordered: Current medicines are reviewed at length with the patient today.  Concerns regarding medicines are outlined above.  Orders Placed This Encounter  Procedures  . DG Chest 2 View  . Comp Met (CMET)  . TSH  . EKG 12-Lead  . ECHOCARDIOGRAM COMPLETE   No orders of the defined types were placed in this encounter.   Patient Instructions  Medication Instructions:  Your provider recommends that you continue on your current medications as directed. Please refer to the Current Medication list given to you today.    Labwork: TODAY: CMET, TSH  Testing/Procedures: Dr. [name] recommends you have a CHEST XRAY. Please proceed to [place] at [facility]) to complete. You do not need an appointment.  Follow-Up: You will be called to arrange your 6 month visit with Dr. [name] AND echocardiogram when his May/June 2021 schedule is available.    Signed, [name], MD  11/04/2019 9:53 AM    [place]

## 2019-11-04 NOTE — Patient Instructions (Addendum)
Medication Instructions:  Your provider recommends that you continue on your current medications as directed. Please refer to the Current Medication list given to you today.    Labwork: TODAY: CMET, TSH  Testing/Procedures: Dr. [name] recommends you have a CHEST XRAY. Please proceed to [place] at [facility]) to complete. You do not need an appointment.  Follow-Up: You will be called to arrange your 6 month visit with Dr. [name] AND echocardiogram when his May/June 2021 schedule is available.

## 2019-11-15 ENCOUNTER — Ambulatory Visit: Payer: Medicare Other

## 2019-11-19 ENCOUNTER — Ambulatory Visit: Payer: Medicare Other

## 2019-11-22 ENCOUNTER — Ambulatory Visit: Payer: Medicare Other

## 2019-12-18 NOTE — Telephone Encounter (Signed)
error 

## 2019-12-20 ENCOUNTER — Ambulatory Visit (INDEPENDENT_AMBULATORY_CARE_PROVIDER_SITE_OTHER): Payer: Medicare Other | Admitting: General Practice

## 2019-12-20 ENCOUNTER — Other Ambulatory Visit: Payer: Self-pay

## 2019-12-20 ENCOUNTER — Other Ambulatory Visit: Payer: Self-pay | Admitting: Family Medicine

## 2019-12-20 DIAGNOSIS — Z7901 Long term (current) use of anticoagulants: Secondary | ICD-10-CM | POA: Diagnosis not present

## 2019-12-20 DIAGNOSIS — I482 Chronic atrial fibrillation, unspecified: Secondary | ICD-10-CM | POA: Diagnosis not present

## 2019-12-20 DIAGNOSIS — Z952 Presence of prosthetic heart valve: Secondary | ICD-10-CM

## 2019-12-20 LAB — POCT INR: INR: 1.8 — AB (ref 2.0–3.0)

## 2019-12-20 NOTE — Patient Instructions (Addendum)
.  lbpcmh  Take extra 1/2 tablet today (1/15) and then continue to take 1 tablet daily except 1/2 tablet on Mon/Wed/Friday.  Re-check in 3 weeks.

## 2019-12-20 NOTE — Progress Notes (Signed)
Medical screening examination/treatment/procedure(s) were performed by non-physician practitioner and as supervising physician I was immediately available for consultation/collaboration. I agree with above. Gedeon Brandow, MD   

## 2019-12-26 ENCOUNTER — Ambulatory Visit (INDEPENDENT_AMBULATORY_CARE_PROVIDER_SITE_OTHER): Payer: Medicare Other | Admitting: Family Medicine

## 2019-12-26 ENCOUNTER — Ambulatory Visit: Payer: Self-pay | Admitting: *Deleted

## 2019-12-26 ENCOUNTER — Encounter: Payer: Self-pay | Admitting: Family Medicine

## 2019-12-26 ENCOUNTER — Other Ambulatory Visit: Payer: Self-pay

## 2019-12-26 VITALS — BP 158/62 | HR 62 | Temp 97.8°F | Wt 135.0 lb

## 2019-12-26 DIAGNOSIS — R58 Hemorrhage, not elsewhere classified: Secondary | ICD-10-CM | POA: Diagnosis not present

## 2019-12-26 DIAGNOSIS — S80811A Abrasion, right lower leg, initial encounter: Secondary | ICD-10-CM | POA: Diagnosis not present

## 2019-12-26 DIAGNOSIS — Z7901 Long term (current) use of anticoagulants: Secondary | ICD-10-CM | POA: Diagnosis not present

## 2019-12-26 DIAGNOSIS — S0081XA Abrasion of other part of head, initial encounter: Secondary | ICD-10-CM

## 2019-12-26 DIAGNOSIS — W109XXA Fall (on) (from) unspecified stairs and steps, initial encounter: Secondary | ICD-10-CM

## 2019-12-26 NOTE — Telephone Encounter (Signed)
Patient fell going up the steps.R leg is bruised with knot. Patient is on blood thinner.  Reason for Disposition . [1] High-risk adult (e.g., age > 8, osteoporosis, chronic steroid use) AND [2] limping    Patient not limping- but is using blood thinner and has knot with bruising at injury site.  Answer Assessment - Initial Assessment Questions 1. MECHANISM: "How did the injury happen?" (e.g., twisting injury, direct blow)      [address] going up the steps at work 2. ONSET: "When did the injury happen?" (Minutes or hours ago)      Tuesday am 8:00 3. LOCATION: "Where is the injury located?"      Lower Right leg 4. APPEARANCE of INJURY: "What does the injury look like?"  (e.g., deformity of leg)     Slight swelling, knot present and bruising  5. SEVERITY: "Can you put weight on that leg?" "Can you walk?"      Patient is able to walk on leg 6. SIZE: For cuts, bruises, or swelling, ask: "How large is it?" (e.g., inches or centimeters)      Knot on side of lower leg- silver dollar size 7. PAIN: "Is there pain?" If so, ask: "How bad is the pain?"  (Scale 1-10; or mild, moderate, severe)     No pain unless palpitated  8. TETANUS: For any breaks in the skin, ask: "When was the last tetanus booster?"     no 9. OTHER SYMPTOMS: "Do you have any other symptoms?"      no 10. PREGNANCY: "Is there any chance you are pregnant?" "When was your last menstrual period?"       n/a  Protocols used: LEG INJURY-A-AH

## 2019-12-26 NOTE — Telephone Encounter (Signed)
Patient has an appointment with [facility] today at [place]

## 2019-12-26 NOTE — Patient Instructions (Signed)
Fall Prevention in the Home, Adult Falls can cause injuries. They can happen to people of all ages. There are many things you can do to make your home safe and to help prevent falls. Ask for help when making these changes, if needed. What actions can I take to prevent falls? General Instructions  Use good lighting in all rooms. Replace any light bulbs that burn out.  Turn on the lights when you go into a dark area. Use night-lights.  Keep items that you use often in easy-to-reach places. Lower the shelves around your home if necessary.  Set up your furniture so you have a clear path. Avoid moving your furniture around.  Do not have throw rugs and other things on the floor that can make you trip.  Avoid walking on wet floors.  If any of your floors are uneven, fix them.  Add color or contrast paint or tape to clearly mark and help you see: ? Any grab bars or handrails. ? First and last steps of stairways. ? Where the edge of each step is.  If you use a stepladder: ? Make sure that it is fully opened. Do not climb a closed stepladder. ? Make sure that both sides of the stepladder are locked into place. ? Ask someone to hold the stepladder for you while you use it.  If there are any pets around you, be aware of where they are. What can I do in the bathroom?      Keep the floor dry. Clean up any water that spills onto the floor as soon as it happens.  Remove soap buildup in the tub or shower regularly.  Use non-skid mats or decals on the floor of the tub or shower.  Attach bath mats securely with double-sided, non-slip rug tape.  If you need to sit down in the shower, use a plastic, non-slip stool.  Install grab bars by the toilet and in the tub and shower. Do not use towel bars as grab bars. What can I do in the bedroom?  Make sure that you have a light by your bed that is easy to reach.  Do not use any sheets or blankets that are too big for your bed. They should not  hang down onto the floor.  Have a firm chair that has side arms. You can use this for support while you get dressed. What can I do in the kitchen?  Clean up any spills right away.  If you need to reach something above you, use a strong step stool that has a grab bar.  Keep electrical cords out of the way.  Do not use floor polish or wax that makes floors slippery. If you must use wax, use non-skid floor wax. What can I do with my stairs?  Do not leave any items on the stairs.  Make sure that you have a light switch at the top of the stairs and the bottom of the stairs. If you do not have them, ask someone to add them for you.  Make sure that there are handrails on both sides of the stairs, and use them. Fix handrails that are broken or loose. Make sure that handrails are as long as the stairways.  Install non-slip stair treads on all stairs in your home.  Avoid having throw rugs at the top or bottom of the stairs. If you do have throw rugs, attach them to the floor with carpet tape.  Choose a carpet that does   not hide the edge of the steps on the stairway.  Check any carpeting to make sure that it is firmly attached to the stairs. Fix any carpet that is loose or worn. What can I do on the outside of my home?  Use bright outdoor lighting.  Regularly fix the edges of walkways and driveways and fix any cracks.  Remove anything that might make you trip as you walk through a door, such as a raised step or threshold.  Trim any bushes or trees on the path to your home.  Regularly check to see if handrails are loose or broken. Make sure that both sides of any steps have handrails.  Install guardrails along the edges of any raised decks and porches.  Clear walking paths of anything that might make someone trip, such as tools or rocks.  Have any leaves, snow, or ice cleared regularly.  Use sand or salt on walking paths during winter.  Clean up any spills in your garage right  away. This includes grease or oil spills. What other actions can I take?  Wear shoes that: ? Have a low heel. Do not wear high heels. ? Have rubber bottoms. ? Are comfortable and fit you well. ? Are closed at the toe. Do not wear open-toe sandals.  Use tools that help you move around (mobility aids) if they are needed. These include: ? Canes. ? Walkers. ? Scooters. ? Crutches.  Review your medicines with your doctor. Some medicines can make you feel dizzy. This can increase your chance of falling. Ask your doctor what other things you can do to help prevent falls. Where to find more information  Centers for Disease Control and Prevention, STEADI: https://garcia.biz/  [employer] on Aging: BrainJudge.co.uk Contact a doctor if:  You are afraid of falling at home.  You feel weak, drowsy, or dizzy at home.  You fall at home. Summary  There are many simple things that you can do to make your home safe and to help prevent falls.  Ways to make your home safe include removing tripping hazards and installing grab bars in the bathroom.  Ask for help when making these changes in your home. This information is not intended to replace advice given to you by your health care provider. Make sure you discuss any questions you have with your health care provider. Document Revised: 03/14/2019 Document Reviewed: 07/06/2017 Elsevier Patient Education  [place].  Contusion A contusion is a deep bruise. Contusions are the result of a blunt injury to tissues and muscle fibers under the skin. The injury causes bleeding under the skin. The skin overlying the contusion may turn blue, purple, or yellow. Minor injuries will give you a painless contusion, but more severe injuries cause contusions that may stay painful and swollen for a few weeks. Follow these instructions at home: Pay attention to any changes in your symptoms. Let your health care provider know about them. Take  these actions to relieve your pain. Managing pain, stiffness, and swelling   Use resting, icing, applying pressure (compression), and raising (elevating) the injured area. This is often called the RICE strategy. ? Rest the injured area. Return to your normal activities as told by your health care provider. Ask your health care provider what activities are safe for you. ? If directed, put ice on the injured area:  Put ice in a plastic bag.  Place a towel between your skin and the bag.  Leave the ice on for 20 minutes,  2-3 times per day. ? If directed, apply light compression to the injured area using an elastic bandage. Make sure the bandage is not wrapped too tightly. Remove and reapply the bandage as directed by your health care provider. ? If possible, raise (elevate) the injured area above the level of your heart while you are sitting or lying down. General instructions  Take over-the-counter and prescription medicines only as told by your health care provider.  Keep all follow-up visits as told by your health care provider. This is important. Contact a health care provider if:  Your symptoms do not improve after several days of treatment.  Your symptoms get worse.  You have difficulty moving the injured area. Get help right away if:  You have severe pain.  You have numbness in a hand or foot.  Your hand or foot turns pale or cold. Summary  A contusion is a deep bruise.  Contusions are the result of a blunt injury to tissues and muscle fibers under the skin.  It is treated with rest, ice, compression, and elevation. You may be given over-the-counter medicines for pain.  Contact a health care provider if your symptoms do not improve, or get worse.  Get help right away if you have severe pain, have numbness, or the area turns pale or cold. This information is not intended to replace advice given to you by your health care provider. Make sure you discuss any questions you  have with your health care provider. Document Revised: 07/12/2018 Document Reviewed: 07/12/2018 Elsevier Patient Education  [place].

## 2019-12-26 NOTE — Progress Notes (Signed)
Subjective:    Patient ID: Jesus [name], male    DOB: 10/08/1941, 79 y.o.   MRN: [id number]  No chief complaint on file.   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 Elease Hashimoto, who was seen today for acute concern.    Pt fell while walking up stairs Tuesday 1/19 at 0800.  Pt states he hit his R lower leg on the stair and his R forehead on the rail.  Had a small abrasion on R dorsum of hand that bled.  Pt is on coumadin.  Immediately after the fall pt was able to get up and continue his day.  Notes mild discomfort in RLE when laying on his R side at night.  Pt denies LOC, confusion, HAs, pain, difficulty walking, changes in vision.  Pt states he came in today at the urging of his wife.    Past Medical History:  Diagnosis Date  . 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 Aortic stenosis    mild with a mean aortic valve gradient of 12 mmHg  . Atrial fibrillation (HCC)    holding sinus rhythm on Amiodarone  . CAD (coronary artery disease)    a. S/P Ant MI 1980;  b. 1997 S/P CABG x 8 (LIMA to diag-LAD, SVG to OM1-OM2-OM3, SVG to [facility] - Dr [name]);  c. 01/2015 Cath: 3VD, 8/8 patent grafts.  . Cardiomyopathy   . Dysrhythmia   . Esophageal reflux   . GERD (gastroesophageal reflux disease)   . Headache   . History of colonoscopy   . History of transesophageal echocardiography (TEE) for monitoring   . Other and unspecified hyperlipidemia   . S/P TAVR (transcatheter aortic valve replacement)    a. 03/2015 26 mm Edwards Sapien 3 transcatheter heart valve placed via open left transfemoral approach.  . Skin lesions, generalized    facial which may represent actinic keratoses and possible photosensitivity from Amiodarone  . Unspecified essential hypertension     Allergies  Allergen Reactions  . Codeine Other (See Comments)    Pt does not remember  . Iodine Swelling     ROS General: Denies fever, chills, night sweats, changes in weight, changes in appetite HEENT: Denies headaches, ear pain, changes in vision, rhinorrhea, sore throat CV: Denies CP, palpitations, SOB, orthopnea Pulm: Denies SOB, cough, wheezing GI: Denies abdominal pain, nausea, vomiting, diarrhea, constipation GU: Denies dysuria, hematuria, frequency, vaginal discharge Msk: Denies muscle cramps, joint pains Neuro: Denies weakness, numbness, tingling Skin: Denies rashes  + bruising RLE Psych: Denies depression, anxiety, hallucinations    Objective:    Blood pressure (!) 158/62, pulse 62, temperature 97.8 F (36.6 C), temperature source Temporal, weight 135 lb (61.2 kg), SpO2 97 %.  Gen. Pleasant, well-nourished, in no distress, normal affect HEENT: [place]/AT, wearing glasses, bilateral hearing aids in place face symmetric, no scleral icterus, PERRLA, EOMI, no nystagmus,  nares patent without drainage, pharynx without erythema or exudate. Lungs: no accessory muscle use, CTAB, no wheezes or rales Cardiovascular: RRR, mechanical valve audible, trace edema in b/l LEs Abdomen: BS present, soft, NT/ND Musculoskeletal: No TTP of cervical, thoracic, lumbar spine, paraspinal muscles, b/l shoulders, b/l knees, shins, or ankles.  No calf tenderness b/l.  No joint line tenderness of b/l knees.  No deformities, no cyanosis or clubbing, normal tone Neuro:  A&Ox3, CN II-XII intact, normal gait Skin:  Warm, dry,  intact.  No rash.  Mild 1 cm abrasion of R forehead inferior to hairline without erythema or drainage.  RLE with ecchymosis and mild edema   Wt Readings from Last 3 Encounters:  11/04/19 133 lb 12.8 oz (60.7 kg)  09/03/19 133 lb 4.8 oz (60.5 kg)  05/08/19 128 lb (58.1 kg)    Lab Results  Component Value Date   WBC 6.2 09/03/2019   HGB 12.9 (L) 09/03/2019   HCT 38.6 (L) 09/03/2019   PLT 133.0 (L) 09/03/2019   GLUCOSE 89 11/04/2019   CHOL 147 05/01/2019   TRIG 102 05/01/2019   HDL 54  05/01/2019   LDLCALC 73 05/01/2019   ALT 21 11/04/2019   AST 25 11/04/2019   NA 138 11/04/2019   K 4.5 11/04/2019   CL 103 11/04/2019   CREATININE 0.91 11/04/2019   BUN 16 11/04/2019   CO2 23 11/04/2019   TSH 4.230 11/04/2019   PSA 1.25 08/27/2014   INR 1.8 (A) 12/20/2019   HGBA1C 6.0 (H) 03/06/2015    Assessment/Plan:  Fall on or from stairs or steps, initial encounter -stable -discussed potential problems a/w falls and use of blood thinners -no indication for imaging at this time -given strict precautions -given handout  Ecchymosis -supportive care: ice, elevation -given handout  Chronic anticoagulation-Couamdin -h/o TAVR and afib -continue current coumadin dosing: 2.5 mg M, W, F.  5 mg all other days.  Weekly total 27.5 mg -last INR 12/20/19 subtherapeutic at 1.8  (range 2-3) -keep f/u with Coumadin clinic for INR recheck  F/u with pcp   Grier Mitts, MD

## 2020-01-09 ENCOUNTER — Other Ambulatory Visit: Payer: Self-pay | Admitting: Family Medicine

## 2020-01-09 DIAGNOSIS — L821 Other seborrheic keratosis: Secondary | ICD-10-CM | POA: Diagnosis not present

## 2020-01-09 DIAGNOSIS — L570 Actinic keratosis: Secondary | ICD-10-CM | POA: Diagnosis not present

## 2020-01-09 DIAGNOSIS — M7981 Nontraumatic hematoma of soft tissue: Secondary | ICD-10-CM | POA: Diagnosis not present

## 2020-01-09 DIAGNOSIS — C44319 Basal cell carcinoma of skin of other parts of face: Secondary | ICD-10-CM | POA: Diagnosis not present

## 2020-01-09 DIAGNOSIS — D1801 Hemangioma of skin and subcutaneous tissue: Secondary | ICD-10-CM | POA: Diagnosis not present

## 2020-01-10 ENCOUNTER — Other Ambulatory Visit: Payer: Self-pay

## 2020-01-10 ENCOUNTER — Ambulatory Visit (INDEPENDENT_AMBULATORY_CARE_PROVIDER_SITE_OTHER): Payer: Medicare Other | Admitting: General Practice

## 2020-01-10 DIAGNOSIS — Z7901 Long term (current) use of anticoagulants: Secondary | ICD-10-CM | POA: Diagnosis not present

## 2020-01-10 DIAGNOSIS — Z952 Presence of prosthetic heart valve: Secondary | ICD-10-CM

## 2020-01-10 DIAGNOSIS — I482 Chronic atrial fibrillation, unspecified: Secondary | ICD-10-CM

## 2020-01-10 LAB — POCT INR: INR: 2.2 (ref 2.0–3.0)

## 2020-01-10 NOTE — Progress Notes (Signed)
Medical screening examination/treatment/procedure(s) were performed by non-physician practitioner and as supervising physician I was immediately available for consultation/collaboration. I agree with above. Trejon Duford, MD   

## 2020-01-10 NOTE — Patient Instructions (Addendum)
Pre visit review using our clinic review tool, if applicable. No additional management support is needed unless otherwise documented below in the visit note.  Continue to take 1 tablet daily except 1/2 tablet on Mon/Wed/Friday.  Re-check in 4 weeks.  

## 2020-01-10 NOTE — Telephone Encounter (Signed)
May refill amoxicillin 500 mg but dose should be take four tablets 1 hour prior to dental procedure

## 2020-01-29 ENCOUNTER — Other Ambulatory Visit: Payer: Self-pay

## 2020-01-29 ENCOUNTER — Encounter: Payer: Self-pay | Admitting: Family Medicine

## 2020-01-29 ENCOUNTER — Ambulatory Visit: Payer: Medicare Other

## 2020-01-29 ENCOUNTER — Ambulatory Visit (INDEPENDENT_AMBULATORY_CARE_PROVIDER_SITE_OTHER): Payer: Medicare Other | Admitting: Family Medicine

## 2020-01-29 VITALS — BP 120/70 | HR 60 | Temp 97.9°F | Ht 61.0 in | Wt 136.2 lb

## 2020-01-29 DIAGNOSIS — M79672 Pain in left foot: Secondary | ICD-10-CM | POA: Diagnosis not present

## 2020-01-29 DIAGNOSIS — Z0000 Encounter for general adult medical examination without abnormal findings: Secondary | ICD-10-CM | POA: Diagnosis not present

## 2020-01-29 NOTE — Patient Instructions (Signed)

## 2020-01-29 NOTE — Progress Notes (Signed)
Subjective:     Patient ID: Jesus [name], male   DOB: September 09, 1941, 79 y.o.   MRN: [id number]  HPI   [name] is seen for medical follow-up and Medicare subsequent annual wellness visit.  His chronic medical problems include history of CAD, atrial fibrillation, combined systolic and diastolic heart failure, hypertension, GERD, chronic anticoagulation, hyperlipidemia, history of transcatheter aortic valve replacement.  He is followed regularly by cardiology.  Doing generally well.    He had some recent left foot pain for about 2 days in the absence of injury.  That pain is resolved now.  He just received his recent Covid vaccine series.  No foot edema or ecchymosis.  No pain with ambulation.  No recent change of shoes.    1 fall during the past year.  Does not feel off balance.  No injuries with that fall.  Had eye exam about 4 months ago.  He has bilateral hearing aids.  Past Medical History:  Diagnosis Date  . 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 Aortic stenosis    mild with a mean aortic valve gradient of 12 mmHg  . Atrial fibrillation (HCC)    holding sinus rhythm on Amiodarone  . CAD (coronary artery disease)    a. S/P Ant MI 1980;  b. 1997 S/P CABG x 8 (LIMA to diag-LAD, SVG to OM1-OM2-OM3, SVG to [facility] - Dr [name]);  c. 01/2015 Cath: 3VD, 8/8 patent grafts.  . Cardiomyopathy   . Dysrhythmia   . Esophageal reflux   . GERD (gastroesophageal reflux disease)   . Headache   . History of colonoscopy   . History of transesophageal echocardiography (TEE) for monitoring   . Other and unspecified hyperlipidemia   . S/P TAVR (transcatheter aortic valve replacement)    a. 03/2015 26 mm Edwards Sapien 3 transcatheter heart valve placed via open left transfemoral approach.  . Skin lesions, generalized    facial which may represent actinic keratoses and possible photosensitivity from Amiodarone  . Unspecified essential hypertension    Past Surgical History:   Procedure Laterality Date  . CARDIAC CATHETERIZATION    . CARDIOVERSION  11/18/2006   Dr. [name]  . CATARACT EXTRACTION W/ INTRAOCULAR LENS IMPLANT  April '13  (Dr. [name])   left eye only  . CHOLECYSTECTOMY    . CORONARY ARTERY BYPASS GRAFT  02/09/1996   LIMA to diag-LAD, SVG to OM1-OM2-OM3, SVG to [facility]  . EYE SURGERY    . LEFT AND RIGHT HEART CATHETERIZATION WITH CORONARY/GRAFT ANGIOGRAM N/A 01/26/2015   Procedure: LEFT AND RIGHT HEART CATHETERIZATION WITH [name];  Surgeon: [name], MD;  Location: [facility] CATH LAB;  Service: Cardiovascular;  Laterality: N/A;  . TEE WITHOUT CARDIOVERSION N/A 03/10/2015   Procedure: TRANSESOPHAGEAL ECHOCARDIOGRAM (TEE);  Surgeon: [name], MD;  Location: [place];  Service: Open Heart Surgery;  Laterality: N/A;  . TONSILLECTOMY    . TRANSCATHETER AORTIC VALVE REPLACEMENT, TRANSFEMORAL N/A 03/10/2015   Procedure: TRANSCATHETER AORTIC VALVE REPLACEMENT, TRANSFEMORAL;  Surgeon: [name], MD;  Location: [place];  Service: Open Heart Surgery;  Laterality: N/A;    reports that he has never smoked. He has never used smokeless tobacco. He reports that he does not drink alcohol or use drugs. family history includes Atrial fibrillation in his brother; Crohn's disease in his mother; Heart attack (age of onset: 29) in his father; Heart disease in his father and paternal uncle; Heart failure in his father; Prostate cancer in his paternal uncle; Stroke (  age of onset: 55) in his mother. Allergies  Allergen Reactions  . Codeine Other (See Comments)    Pt does not remember  . Iodine Swelling   1.  Risk factors based on Past Medical , Social, and Family history reviewed and as indicated above with no changes  2.  Limitations in physical activities None.  1 fall during the past year.  No associated injury.  Does not feel off balance  3.  Depression/mood No active depression or anxiety issues PHQ 2 equals 0  4.  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  ADLs independent in all.  6.  Cognitive function (orientation to time and place, language, writing, speech,memory) no short or long term memory issues.  Language and judgement intact.  7.  Home Safety no issues  8.  Height, weight, and visual acuity.all stable.  Had eye exam about 4 months ago reportedly stable  Wt Readings from Last 3 Encounters:  01/29/20 136 lb 3.2 oz (61.8 kg)  12/26/19 135 lb (61.2 kg)  11/04/19 133 lb 12.8 oz (60.7 kg)     9.  Counseling discussed   Counseled regarding age and gender appropriate preventative screenings and immunizations.   10. Recommendation of preventive services.  Immunizations are up to date.  Continue with annual flu vaccine.  11. Labs based on risk factors-none indicated at this time  12. Care Plan- as below.  13. Other Providers  Care Team and Communications Referring Provider Tongela Encinas, [name], MD  PCPs Type  [name], MD General  [name], MD Cardiology     14. Written schedule of screening/prevention services given to patient. Health Maintenance  Topic Date Due  . TETANUS/TDAP  08/19/2026  . INFLUENZA VACCINE  Completed  . PNA vac Low Risk Adult  Completed      Review of Systems  Constitutional: Negative for fatigue.  Eyes: Negative for visual disturbance.  Respiratory: Negative for cough, chest tightness and shortness of breath.   Cardiovascular: Negative for chest pain, palpitations and leg swelling.  Neurological: Negative for dizziness, syncope, weakness, light-headedness and headaches.       Objective:   Physical Exam Vitals reviewed.  Constitutional:      Appearance: Normal appearance.  HENT:     Right Ear: Ear canal normal.     Left Ear: Ear canal normal.  Cardiovascular:     Rate and Rhythm: Normal rate.  Pulmonary:     Effort: Pulmonary effort is normal.     Breath sounds: Normal breath sounds.  Abdominal:      Palpations: Abdomen is soft. There is no mass.     Tenderness: There is no abdominal tenderness.  Musculoskeletal:     Cervical back: Neck supple.  Skin:    Findings: No rash.  Neurological:     Mental Status: He is alert.  Psychiatric:        Mood and Affect: Mood normal.        Thought Content: Thought content normal.        Assessment:     #1 Medicare subsequent annual wellness visit.  We discussed health maintenance issues as below  #2 left foot pain.  Normal exam.   ?etiology.  Observe for now.      Plan:     -Vaccines are up-to-date.  -Discussed fall prevention.  He has a home exercise bike and we suggested he try to use that more frequently.  - discussed fall prevention.  [name] MD [facility]

## 2020-02-06 ENCOUNTER — Other Ambulatory Visit: Payer: Self-pay

## 2020-02-06 ENCOUNTER — Ambulatory Visit: Payer: Medicare Other

## 2020-02-06 ENCOUNTER — Ambulatory Visit (INDEPENDENT_AMBULATORY_CARE_PROVIDER_SITE_OTHER): Payer: Medicare Other | Admitting: General Practice

## 2020-02-06 DIAGNOSIS — Z7901 Long term (current) use of anticoagulants: Secondary | ICD-10-CM

## 2020-02-06 DIAGNOSIS — Z952 Presence of prosthetic heart valve: Secondary | ICD-10-CM

## 2020-02-06 DIAGNOSIS — I482 Chronic atrial fibrillation, unspecified: Secondary | ICD-10-CM

## 2020-02-06 LAB — POCT INR: INR: 2 (ref 2.0–3.0)

## 2020-02-06 NOTE — Patient Instructions (Signed)
Pre visit review using our clinic review tool, if applicable. No additional management support is needed unless otherwise documented below in the visit note.  Continue to take 1 tablet daily except 1/2 tablet on Mon/Wed/Friday.  Re-check in 4 weeks.  

## 2020-02-06 NOTE — Progress Notes (Signed)
Medical screening examination/treatment/procedure(s) were performed by non-physician practitioner and as supervising physician I was immediately available for consultation/collaboration. I agree with above. Shuan Statzer, MD   

## 2020-02-27 DIAGNOSIS — H40052 Ocular hypertension, left eye: Secondary | ICD-10-CM | POA: Diagnosis not present

## 2020-02-27 DIAGNOSIS — H04123 Dry eye syndrome of bilateral lacrimal glands: Secondary | ICD-10-CM | POA: Diagnosis not present

## 2020-02-27 DIAGNOSIS — Z961 Presence of intraocular lens: Secondary | ICD-10-CM | POA: Diagnosis not present

## 2020-03-03 ENCOUNTER — Other Ambulatory Visit: Payer: Self-pay | Admitting: Family Medicine

## 2020-03-03 DIAGNOSIS — Z7901 Long term (current) use of anticoagulants: Secondary | ICD-10-CM

## 2020-03-03 NOTE — Telephone Encounter (Signed)
Please advise 

## 2020-03-04 ENCOUNTER — Other Ambulatory Visit: Payer: Self-pay | Admitting: Family Medicine

## 2020-03-04 DIAGNOSIS — Z7901 Long term (current) use of anticoagulants: Secondary | ICD-10-CM

## 2020-03-04 NOTE — Telephone Encounter (Signed)
Please advise 

## 2020-03-05 ENCOUNTER — Other Ambulatory Visit: Payer: Self-pay

## 2020-03-05 ENCOUNTER — Other Ambulatory Visit: Payer: Self-pay | Admitting: Family Medicine

## 2020-03-05 ENCOUNTER — Ambulatory Visit (INDEPENDENT_AMBULATORY_CARE_PROVIDER_SITE_OTHER): Payer: Medicare Other | Admitting: General Practice

## 2020-03-05 DIAGNOSIS — Z952 Presence of prosthetic heart valve: Secondary | ICD-10-CM

## 2020-03-05 DIAGNOSIS — Z7901 Long term (current) use of anticoagulants: Secondary | ICD-10-CM

## 2020-03-05 DIAGNOSIS — I482 Chronic atrial fibrillation, unspecified: Secondary | ICD-10-CM

## 2020-03-05 LAB — POCT INR: INR: 2.2 (ref 2.0–3.0)

## 2020-03-05 NOTE — Progress Notes (Signed)
Medical screening examination/treatment/procedure(s) were performed by non-physician practitioner and as supervising physician I was immediately available for consultation/collaboration. I agree with above. Mikyah Alamo, MD   

## 2020-03-05 NOTE — Patient Instructions (Signed)
Pre visit review using our clinic review tool, if applicable. No additional management support is needed unless otherwise documented below in the visit note.  Continue to take 1 tablet daily except 1/2 tablet on Mon/Wed/Friday.  Re-check in 6 weeks.

## 2020-03-11 ENCOUNTER — Other Ambulatory Visit: Payer: Self-pay | Admitting: Family Medicine

## 2020-03-11 DIAGNOSIS — Z7901 Long term (current) use of anticoagulants: Secondary | ICD-10-CM

## 2020-03-11 NOTE — Telephone Encounter (Signed)
Please advise 

## 2020-03-16 ENCOUNTER — Other Ambulatory Visit: Payer: Self-pay | Admitting: Family Medicine

## 2020-03-16 ENCOUNTER — Other Ambulatory Visit: Payer: Self-pay | Admitting: Cardiovascular Disease

## 2020-04-16 ENCOUNTER — Ambulatory Visit: Payer: Medicare Other

## 2020-04-23 ENCOUNTER — Other Ambulatory Visit: Payer: Self-pay

## 2020-04-23 ENCOUNTER — Ambulatory Visit (INDEPENDENT_AMBULATORY_CARE_PROVIDER_SITE_OTHER): Payer: Medicare Other | Admitting: General Practice

## 2020-04-23 DIAGNOSIS — Z952 Presence of prosthetic heart valve: Secondary | ICD-10-CM

## 2020-04-23 DIAGNOSIS — Z7901 Long term (current) use of anticoagulants: Secondary | ICD-10-CM | POA: Diagnosis not present

## 2020-04-23 DIAGNOSIS — I482 Chronic atrial fibrillation, unspecified: Secondary | ICD-10-CM

## 2020-04-23 LAB — POCT INR: INR: 1.6 — AB (ref 2.0–3.0)

## 2020-04-23 NOTE — Progress Notes (Signed)
Medical screening examination/treatment/procedure(s) were performed by non-physician practitioner and as supervising physician I was immediately available for consultation/collaboration. I agree with above. Leny Morozov, MD   

## 2020-04-23 NOTE — Patient Instructions (Signed)
Pre visit review using our clinic review tool, if applicable. No additional management support is needed unless otherwise documented below in the visit note.  Take 1 1/2 tablets today (5/20) and take 1 tablet tomorrow (5/21) and then continue to take 1 tablet daily except 1/2 tablet on Mon/Wed/Friday.  Re-check in 3 to 4 weeks.

## 2020-04-26 DIAGNOSIS — J209 Acute bronchitis, unspecified: Secondary | ICD-10-CM | POA: Diagnosis not present

## 2020-04-26 DIAGNOSIS — J0100 Acute maxillary sinusitis, unspecified: Secondary | ICD-10-CM | POA: Diagnosis not present

## 2020-05-01 ENCOUNTER — Ambulatory Visit: Payer: Medicare Other | Admitting: Cardiovascular Disease

## 2020-05-01 ENCOUNTER — Ambulatory Visit (HOSPITAL_COMMUNITY): Payer: Medicare Other | Attending: Cardiology

## 2020-05-01 ENCOUNTER — Other Ambulatory Visit: Payer: Self-pay

## 2020-05-01 ENCOUNTER — Encounter: Payer: Self-pay | Admitting: Cardiovascular Disease

## 2020-05-01 VITALS — BP 146/60 | HR 60 | Ht 62.0 in | Wt 131.4 lb

## 2020-05-01 DIAGNOSIS — I480 Paroxysmal atrial fibrillation: Secondary | ICD-10-CM | POA: Diagnosis not present

## 2020-05-01 DIAGNOSIS — I2510 Atherosclerotic heart disease of native coronary artery without angina pectoris: Secondary | ICD-10-CM | POA: Diagnosis not present

## 2020-05-01 DIAGNOSIS — I359 Nonrheumatic aortic valve disorder, unspecified: Secondary | ICD-10-CM | POA: Diagnosis not present

## 2020-05-01 NOTE — Patient Instructions (Addendum)
Medication Instructions:  Your provider recommends that you continue on your current medications as directed. Please refer to the Current Medication list given to you today.   *If you need a refill on your cardiac medications before your next appointment, please call your pharmacy*  Lab Work: TODAY! CBC. CMET, TSH If you have labs (blood work) drawn today and your tests are completely normal, you will receive your results only by: [name] MyChart Message (if you have MyChart) OR . A paper copy in the mail If you have any lab test that is abnormal or we need to change your treatment, we will call you to review the results.  Follow-Up: You have an appointment with Dr. [name] on 11/04/2020 at 2:20PM.

## 2020-05-01 NOTE — Progress Notes (Signed)
Cardiology Office Note:    Date:  05/01/2020   ID:  Jesus [name], DOB 1941-06-09, MRN [id number]  PCP:  Jesus Post, MD  Cardiologist:  Jesus Mocha, MD  Electrophysiologist:  None   Referring MD: Jesus Post, MD   Chief Complaint  Patient presents with  . Coronary Artery Disease  . Atrial Fibrillation    History of Present Illness:    Jesus [name] is a 79 y.o. male with a hx of coronary artery disease with remote MI, aortic valve disease status [name] TAVR, and persistent atrial fibrillation maintaining sinus rhythm on long-term amiodarone.  He presents today for follow-up evaluation.  The patient is here alone.  He denies any chest pain, chest pressure, shortness of breath, leg swelling, heart palpitations, orthopnea, or PND.  He has been physically active with no specific exertional complaints.  He denies bleeding problems on chronic oral anticoagulation with warfarin.  Past Medical History:  Diagnosis Date  . Acute myocardial infarction of inferior wall ([place]) 1980  . Aortic stenosis    mild with a mean aortic valve gradient of 12 mmHg  . Atrial fibrillation (HCC)    holding sinus rhythm on Amiodarone  . CAD (coronary artery disease)    a. S/P Ant MI 1980;  b. 1997 S/P CABG x 8 (LIMA to diag-LAD, SVG to OM1-OM2-OM3, SVG to [facility] - Dr Jesus [name]);  c. 01/2015 Cath: 3VD, 8/8 patent grafts.  . Cardiomyopathy   . Dysrhythmia   . Esophageal reflux   . GERD (gastroesophageal reflux disease)   . Headache   . History of colonoscopy   . History of transesophageal echocardiography (TEE) for monitoring   . Other and unspecified hyperlipidemia   . S/P TAVR (transcatheter aortic valve replacement)    a. 03/2015 26 mm Edwards Sapien 3 transcatheter heart valve placed via open left transfemoral approach.  . Skin lesions, generalized    facial which may represent actinic keratoses and possible photosensitivity from Amiodarone  . Unspecified essential hypertension      Past Surgical History:  Procedure Laterality Date  . CARDIAC CATHETERIZATION    . CARDIOVERSION  11/18/2006   Dr. Orene [name]  . CATARACT EXTRACTION W/ INTRAOCULAR LENS IMPLANT  April '13  (Dr. Kathrin [name])   left eye only  . CHOLECYSTECTOMY    . CORONARY ARTERY BYPASS GRAFT  02/09/1996   LIMA to diag-LAD, SVG to OM1-OM2-OM3, [facility]G to [facility]  . EYE SURGERY    . LEFT AND RIGHT HEART CATHETERIZATION WITH CORONARY/GRAFT ANGIOGRAM N/A 01/26/2015   Procedure: LEFT AND RIGHT HEART CATHETERIZATION WITH [name];  Surgeon: Jesus Ohara, MD;  Location: [facility] C[facility] LAB;  Service: Cardiovascular;  Laterality: N/A;  . TEE WITHOUT CARDIOVERSION N/A 03/10/2015   Procedure: TRANSESOPHAGEAL ECHOCARDIOGRAM (TEE);  Surgeon: Jesus Mocha, MD;  Location: [place];  Service: Open Heart Surgery;  Laterality: N/A;  . TONSILLECTOMY    . TRANSCATHETER AORTIC VALVE REPLACEMENT, TRANSFEMORAL N/A 03/10/2015   Procedure: TRANSCATHETER AORTIC VALVE REPLACEMENT, TRANSFEMORAL;  Surgeon: Jesus Mocha, MD;  Location: [place];  Service: Open Heart Surgery;  Laterality: N/A;    Current Medications: Current Meds  Medication Sig  . acetaminophen (TYLENOL) 500 MG tablet Take 500 mg by mouth every 6 (six) hours as needed for moderate pain.  [name] albuterol (VENTOLIN HFA) 108 (90 Base) MCG/ACT inhaler Inhale 2 puffs into the lungs every 4 (four) hours as needed for wheezing or shortness of breath.  [name] amiodarone (PACERONE) 200 MG tablet TAKE 1/2 TABLET BY  MOUTH DAILY  . amLODipine (NORVASC) 5 MG tablet TAKE 1 TABLET BY MOUTH ONCE DAILY  . amoxicillin (AMOXIL) 400 MG/5ML suspension take 5 teaspoons one hour prior to dental procedure (dose should be amoxicillin 2 gm one hour prior to procedure)  . amoxicillin (AMOXIL) 500 MG capsule TAKE 4 CAPSULEs BY MOUTH 1 HOUR PRIOR TO DENTAL APPOINTMENT  . Ascorbic Acid (VITAMIN C) 1000 MG tablet Take 1,000 mg by mouth daily.    [name] aspirin 81 MG tablet Take 81 mg by mouth daily.     [name] atorvastatin (LIPITOR) 80 MG tablet TAKE 1/2 TABLET BY MOUTH DAILY  . loratadine (CLARITIN) 10 MG tablet Take 10 mg by mouth daily as needed for allergies.   [name] losartan (COZAAR) 100 MG tablet TAKE 1 TABLET BY MOUTH ONCE DAILY  . Multiple Vitamins-Minerals (MULTIVITAMIN,TX-MINERALS) tablet Take 1 tablet by mouth daily.    [name] warfarin (COUMADIN) 5 MG tablet TAKE 1/2 TABLET BY MOUTH DAILY EXCEPT 1 TABLET ON MONDAY,WEDNESDAY,AND FRIDAYS OR TAKE AS DIRECTED BY CLINIC     Allergies:   Codeine and Iodine   Social History   Socioeconomic History  . Marital status: Married    Spouse name: Not on file  . Number of children: Not on file  . Years of education: Not on file  . Highest education level: Not on file  Occupational History  . Not on file  Tobacco Use  . Smoking status: Never Smoker  . Smokeless tobacco: Never Used  . Tobacco comment: Does not smoke.  Substance and Sexual Activity  . Alcohol use: No    Alcohol/week: 0.0 standard drinks  . Drug use: No  . Sexual activity: Not Currently  Other Topics Concern  . Not on file  Social History Narrative   HSG. [employer] 6 years. Married - '69 - 1 year/divorced. '76 - . No children. Work - mfg/textiles - Designer, multimedia; currently works doing maintenance. [employer] - they have discussed this. Provided packet august '13.                Social Determinants of Health   Financial Resource Strain:   . Difficulty of Paying Living Expenses:   Food Insecurity:   . Worried About Charity fundraiser in the Last Year:   . Arboriculturist in the Last Year:   Transportation Needs:   . Film/video editor (Medical):   [name] Lack of Transportation (Non-Medical):   Physical Activity:   . Days of Exercise per Week:   . Minutes of Exercise per Session:   Stress:   . Feeling of Stress :   Social Connections:   . Frequency of Communication with Friends and Family:   . Frequency of Social Gatherings with Friends and Family:   .  Attends Religious Services:   . Active Member of Clubs or Organizations:   . Attends Archivist Meetings:   [name] Marital Status:      Family History: The patient's family history includes Atrial fibrillation in his brother; Crohn's disease in his mother; Heart attack (age of onset: 24) in his father; Heart disease in his father and paternal uncle; Heart failure in his father; Prostate cancer in his paternal uncle; Stroke (age of onset: 20) in his mother. There is no history of Colon cancer.  ROS:   Please see the history of present illness.    All other systems reviewed and are negative.  EKGs/Labs/Other Studies Reviewed:    The following  studies were reviewed today: 2D echocardiogram: IMPRESSIONS    1. Left ventricular ejection fraction, by estimation, is 40 to 45%. The  left ventricle has mildly decreased function. The left ventricle  demonstrates global hypokinesis. There is mild left ventricular  hypertrophy. Left ventricular diastolic parameters  are consistent with Grade I diastolic dysfunction (impaired relaxation).  2. Right ventricular systolic function is normal. The right ventricular  size is normal.  3. Left atrial size was moderately dilated.  4. The mitral valve is normal in structure. Mild mitral valve  regurgitation. No evidence of mitral stenosis.  5. Mild to moderate perivalvular leak adjacent to mitral annulus. The  aortic valve has been repaired/replaced. Aortic valve regurgitation is not  visualized. No aortic stenosis is present. There is a 26 mm Edwards Sapien  prosthetic (TAVR) valve present  in the aortic position. Aortic valve mean gradient measures 14.0 mmHg.  Aortic valve Vmax measures 2.61 m/s.  6. The inferior vena cava is normal in size with greater than 50%  respiratory variability, suggesting right atrial pressure of 3 mmHg.   Comparison(s): No significant change from prior study. Prior images  reviewed side by side.   EKG:  EKG  is not ordered today.   Recent Labs: 09/03/2019: Hemoglobin 12.9; Platelets 133.0 11/04/2019: ALT 21; BUN 16; Creatinine, Ser 0.91; Potassium 4.5; Sodium 138; TSH 4.230  Recent Lipid Panel    Component Value Date/Time   CHOL 147 05/01/2019 0903   TRIG 102 05/01/2019 0903   TRIG 67 11/20/2006 0748   HDL 54 05/01/2019 0903   CHOLHDL 2.7 05/01/2019 0903   CHOLHDL 3.0 09/12/2016 1311   VLDL 24 09/12/2016 1311   LDLCALC 73 05/01/2019 0903    Physical Exam:    VS:  BP (!) 146/60   Pulse 60   Ht 5\' 2"  (1.575 m)   Wt 131 lb 6.4 oz (59.6 kg)   BMI 24.03 kg/m     Wt Readings from Last 3 Encounters:  05/01/20 131 lb 6.4 oz (59.6 kg)  01/29/20 136 lb 3.2 oz (61.8 kg)  12/26/19 135 lb (61.2 kg)     GEN:  Well nourished, well developed in no acute distress HEENT: Normal NECK: No JVD; No carotid bruits LYMPHATICS: No lymphadenopathy CARDIAC: RRR, 2/6 systolic ejection murmur at the right upper sternal border and 2/6 diastolic decrescendo murmur at the left lower sternal border RESPIRATORY:  Clear to auscultation without rales, wheezing or rhonchi  ABDOMEN: Soft, non-tender, non-distended MUSCULOSKELETAL:  No edema; No deformity  SKIN: Warm and dry NEUROLOGIC:  Alert and oriented x 3 PSYCHIATRIC:  Normal affect   ASSESSMENT:    1. Paroxysmal atrial fibrillation (HCC)   2. Coronary artery disease involving native coronary artery of native heart without angina pectoris   3. Aortic valve disorder    PLAN:    In order of problems listed above:  1. Maintaining sinus rhythm on amiodarone.  Continue warfarin for anticoagulation.  Update labs today to include a CBC, TSH, and full metabolic panel including LFTs for amiodarone monitoring.  Follow-up in 6 months. 2. Doing well with no anginal symptoms.  He continues on a high intensity statin drug and warfarin. 3. The patient's echo is reviewed.  His transcatheter heart valve function is unchanged.  He has normal transvalvular gradients  and mild to moderate paravalvular regurgitation.  I compared his echo images side-by-side and demonstrated them to the patient.  There is no evidence of LV dilatation or progressive LV dysfunction.  Would continue  observation with plans to repeat his echocardiogram in 1 year.   Medication Adjustments/Labs and Tests Ordered: Current medicines are reviewed at length with the patient today.  Concerns regarding medicines are outlined above.  Orders Placed This Encounter  Procedures  . CBC with Differential/Platelet  . Comprehensive metabolic panel  . TSH   No orders of the defined types were placed in this encounter.   Patient Instructions  Medication Instructions:  Your provider recommends that you continue on your current medications as directed. Please refer to the Current Medication list given to you today.   *If you need a refill on your cardiac medications before your next appointment, please call your pharmacy*  Lab Work: TODAY! CBC. CMET, TSH If you have labs (blood work) drawn today and your tests are completely normal, you will receive your results only by: [name] MyChart Message (if you have MyChart) OR . A paper copy in the mail If you have any lab test that is abnormal or we need to change your treatment, we will call you to review the results.  Follow-Up: You have an appointment with Dr. [name] on 11/04/2020 at 2:20PM.    Signed, Jesus Mocha, MD  05/01/2020 2:36 PM    [place]

## 2020-05-02 LAB — COMPREHENSIVE METABOLIC PANEL
ALT: 26 IU/L (ref 0–44)
AST: 22 IU/L (ref 0–40)
Albumin/Globulin Ratio: 1.6 (ref 1.2–2.2)
Albumin: 3.9 g/dL (ref 3.6–4.6)
Alkaline Phosphatase: 107 IU/L (ref 48–121)
BUN/Creatinine Ratio: 24 (ref 10–24)
BUN: 25 mg/dL (ref 8–27)
Bilirubin Total: 0.5 mg/dL (ref 0.0–1.2)
CO2: 23 mmol/L (ref 20–29)
Calcium: 8.9 mg/dL (ref 8.6–10.2)
Chloride: 102 mmol/L (ref 96–106)
Creatinine, Ser: 1.05 mg/dL (ref 0.76–1.27)
GFR calc Af Amer: 77 mL/min/{1.73_m2} (ref >59)
GFR calc non Af Amer: 66 mL/min/{1.73_m2} (ref >59)
Globulin, Total: 2.4 g/dL (ref 1.5–4.5)
Glucose: 91 mg/dL (ref 65–99)
Potassium: 3.8 mmol/L (ref 3.5–5.2)
Sodium: 137 mmol/L (ref 134–144)
Total Protein: 6.3 g/dL (ref 6.0–8.5)

## 2020-05-02 LAB — CBC WITH DIFFERENTIAL/PLATELET
Basophils Absolute: 0 10*3/uL (ref 0.0–0.2)
Basos: 0 %
EOS (ABSOLUTE): 0.1 10*3/uL (ref 0.0–0.4)
Eos: 1 %
Hematocrit: 35.8 % — ABNORMAL LOW (ref 37.5–51.0)
Hemoglobin: 12.6 g/dL — ABNORMAL LOW (ref 13.0–17.7)
Immature Grans (Abs): 0 10*3/uL (ref 0.0–0.1)
Immature Granulocytes: 0 %
Lymphocytes Absolute: 2.5 10*3/uL (ref 0.7–3.1)
Lymphs: 26 %
MCH: 33 pg (ref 26.6–33.0)
MCHC: 35.2 g/dL (ref 31.5–35.7)
MCV: 94 fL (ref 79–97)
Monocytes Absolute: 1.1 10*3/uL — ABNORMAL HIGH (ref 0.1–0.9)
Monocytes: 11 %
Neutrophils Absolute: 6 10*3/uL (ref 1.4–7.0)
Neutrophils: 62 %
Platelets: 160 10*3/uL (ref 150–450)
RBC: 3.82 x10E6/uL — ABNORMAL LOW (ref 4.14–5.80)
RDW: 12.4 % (ref 11.6–15.4)
WBC: 9.6 10*3/uL (ref 3.4–10.8)

## 2020-05-02 LAB — TSH: TSH: 2.52 u[IU]/mL (ref 0.450–4.500)

## 2020-05-21 ENCOUNTER — Other Ambulatory Visit: Payer: Self-pay

## 2020-05-21 ENCOUNTER — Ambulatory Visit (INDEPENDENT_AMBULATORY_CARE_PROVIDER_SITE_OTHER): Payer: Medicare Other | Admitting: General Practice

## 2020-05-21 DIAGNOSIS — Z7901 Long term (current) use of anticoagulants: Secondary | ICD-10-CM

## 2020-05-21 DIAGNOSIS — I482 Chronic atrial fibrillation, unspecified: Secondary | ICD-10-CM

## 2020-05-21 DIAGNOSIS — Z952 Presence of prosthetic heart valve: Secondary | ICD-10-CM | POA: Diagnosis not present

## 2020-05-21 LAB — POCT INR: INR: 2.8 (ref 2.0–3.0)

## 2020-05-21 NOTE — Patient Instructions (Addendum)
Pre visit review using our clinic review tool, if applicable. No additional management support is needed unless otherwise documented below in the visit note.  Continue to take 1 tablet daily except 1/2 tablet on Mon/Wed/Friday.  Re-check in 4 weeks.

## 2020-05-21 NOTE — Progress Notes (Signed)
Medical screening examination/treatment/procedure(s) were performed by non-physician practitioner and as supervising physician I was immediately available for consultation/collaboration. I agree with above. Kalianna Verbeke, MD   

## 2020-06-12 ENCOUNTER — Other Ambulatory Visit: Payer: Self-pay | Admitting: Cardiovascular Disease

## 2020-06-12 ENCOUNTER — Other Ambulatory Visit: Payer: Self-pay | Admitting: Family Medicine

## 2020-06-18 ENCOUNTER — Other Ambulatory Visit: Payer: Self-pay

## 2020-06-18 ENCOUNTER — Ambulatory Visit (INDEPENDENT_AMBULATORY_CARE_PROVIDER_SITE_OTHER): Payer: Medicare Other | Admitting: General Practice

## 2020-06-18 DIAGNOSIS — Z952 Presence of prosthetic heart valve: Secondary | ICD-10-CM

## 2020-06-18 DIAGNOSIS — Z7901 Long term (current) use of anticoagulants: Secondary | ICD-10-CM

## 2020-06-18 DIAGNOSIS — I482 Chronic atrial fibrillation, unspecified: Secondary | ICD-10-CM

## 2020-06-18 LAB — POCT INR: INR: 2 (ref 2.0–3.0)

## 2020-06-18 NOTE — Patient Instructions (Addendum)
Pre visit review using our clinic review tool, if applicable. No additional management support is needed unless otherwise documented below in the visit note.  Continue to take 1 tablet daily except 1/2 tablet on Mon/Wed/Friday.  Re-check in 4 weeks.

## 2020-06-18 NOTE — Progress Notes (Signed)
Medical screening examination/treatment/procedure(s) were performed by non-physician practitioner and as supervising physician I was immediately available for consultation/collaboration. I agree with above. Dastan Krider, MD   

## 2020-07-16 ENCOUNTER — Other Ambulatory Visit: Payer: Self-pay

## 2020-07-16 ENCOUNTER — Ambulatory Visit (INDEPENDENT_AMBULATORY_CARE_PROVIDER_SITE_OTHER): Payer: Medicare Other | Admitting: General Practice

## 2020-07-16 DIAGNOSIS — Z952 Presence of prosthetic heart valve: Secondary | ICD-10-CM | POA: Diagnosis not present

## 2020-07-16 DIAGNOSIS — Z7901 Long term (current) use of anticoagulants: Secondary | ICD-10-CM | POA: Diagnosis not present

## 2020-07-16 DIAGNOSIS — I482 Chronic atrial fibrillation, unspecified: Secondary | ICD-10-CM | POA: Diagnosis not present

## 2020-07-16 LAB — POCT INR: INR: 1.9 — AB (ref 2.0–3.0)

## 2020-07-16 NOTE — Patient Instructions (Addendum)
Pre visit review using our clinic review tool, if applicable. No additional management support is needed unless otherwise documented below in the visit note.  Take 1 1/2 tablets today (8/12) and then change dosage and take 1 tablet daily except 1/2 tablet on Mon and Friday.  Re-check in 4 weeks.

## 2020-07-16 NOTE — Progress Notes (Signed)
Medical screening examination/treatment/procedure(s) were performed by non-physician practitioner and as supervising physician I was immediately available for consultation/collaboration. I agree with above. Vidalia Serpas, MD   

## 2020-08-01 ENCOUNTER — Other Ambulatory Visit: Payer: Self-pay | Admitting: Family Medicine

## 2020-08-01 DIAGNOSIS — Z7901 Long term (current) use of anticoagulants: Secondary | ICD-10-CM

## 2020-08-03 ENCOUNTER — Telehealth: Payer: Self-pay | Admitting: Family Medicine

## 2020-08-03 ENCOUNTER — Encounter: Payer: Self-pay | Admitting: Family Medicine

## 2020-08-03 ENCOUNTER — Other Ambulatory Visit: Payer: Self-pay

## 2020-08-03 ENCOUNTER — Ambulatory Visit (INDEPENDENT_AMBULATORY_CARE_PROVIDER_SITE_OTHER): Payer: Medicare Other | Admitting: Family Medicine

## 2020-08-03 VITALS — BP 130/54 | HR 54 | Temp 98.2°F | Ht 62.0 in | Wt 135.0 lb

## 2020-08-03 DIAGNOSIS — M25572 Pain in left ankle and joints of left foot: Secondary | ICD-10-CM | POA: Diagnosis not present

## 2020-08-03 NOTE — Patient Instructions (Signed)
Try some icing of left ankle 20 minutes three times daily  Consider trial of over the counter Diclofenac gel.   Comfortable shoes such as tennis shoes.     Let me know if pain not improving over next few weeks.

## 2020-08-03 NOTE — Progress Notes (Signed)
Established Patient Office Visit  Subjective:  Patient ID: Jesus [name], male    DOB: May 05, 1941  Age: 79 y.o. MRN: [id number]  CC:  Chief Complaint  Patient presents with  . Foot Pain    left poot pain    HPI Jesus [name] presents for left foot pain.  His pain is actually more in the ankle region.  First noted Friday.  No known injury.  No change of shoewear.  Location is inferior and somewhat posterior to the medial malleolus.  Denies any Achilles or retrocalcaneal pain.  No plantar fascia pain.  No visible swelling.  No redness.  Pain is actually improved today.  He still works about 25 hours/week and does go to the gym frequently prior to work.  Is walking on the treadmill there predominantly.  Still has intermittent dyspepsia but takes over-the-counter Pepcid and that seems to control things fairly well.  Past Medical History:  Diagnosis Date  . 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 Aortic stenosis    mild with a mean aortic valve gradient of 12 mmHg  . Atrial fibrillation (HCC)    holding sinus rhythm on Amiodarone  . CAD (coronary artery disease)    a. S/P Ant MI 1980;  b. 1997 S/P CABG x 8 (LIMA to diag-LAD, SVG to OM1-OM2-OM3, SVG to [facility] - Dr [name]);  c. 01/2015 Cath: 3VD, 8/8 patent grafts.  . Cardiomyopathy   . Dysrhythmia   . Esophageal reflux   . GERD (gastroesophageal reflux disease)   . Headache   . History of colonoscopy   . History of transesophageal echocardiography (TEE) for monitoring   . Other and unspecified hyperlipidemia   . S/P TAVR (transcatheter aortic valve replacement)    a. 03/2015 26 mm Edwards Sapien 3 transcatheter heart valve placed via open left transfemoral approach.  . Skin lesions, generalized    facial which may represent actinic keratoses and possible photosensitivity from Amiodarone  . Unspecified essential hypertension     Past Surgical History:  Procedure Laterality Date  . CARDIAC  CATHETERIZATION    . CARDIOVERSION  11/18/2006   Dr. [name]  . CATARACT EXTRACTION W/ INTRAOCULAR LENS IMPLANT  April '13  (Dr. [name])   left eye only  . CHOLECYSTECTOMY    . CORONARY ARTERY BYPASS GRAFT  02/09/1996   LIMA to diag-LAD, SVG to OM1-OM2-OM3, SVG to [facility]  . EYE SURGERY    . LEFT AND RIGHT HEART CATHETERIZATION WITH CORONARY/GRAFT ANGIOGRAM N/A 01/26/2015   Procedure: LEFT AND RIGHT HEART CATHETERIZATION WITH [name];  Surgeon: [name], MD;  Location: [facility] CATH LAB;  Service: Cardiovascular;  Laterality: N/A;  . TEE WITHOUT CARDIOVERSION N/A 03/10/2015   Procedure: TRANSESOPHAGEAL ECHOCARDIOGRAM (TEE);  Surgeon: [name], MD;  Location: [place];  Service: Open Heart Surgery;  Laterality: N/A;  . TONSILLECTOMY    . TRANSCATHETER AORTIC VALVE REPLACEMENT, TRANSFEMORAL N/A 03/10/2015   Procedure: TRANSCATHETER AORTIC VALVE REPLACEMENT, TRANSFEMORAL;  Surgeon: [name], MD;  Location: [place];  Service: Open Heart Surgery;  Laterality: N/A;    Family History  Problem Relation Age of Onset  . Stroke Mother 63       Deceased  . Crohn's disease Mother        Deceased  . Heart attack Father 49       Deceased  . Heart disease Father   . Heart failure Father        Deceased  . Atrial fibrillation  Brother   . Heart disease Paternal Uncle   . Prostate cancer Paternal Uncle   . Colon cancer Neg Hx     Social History   Socioeconomic History  . Marital status: Married    Spouse name: Not on file  . Number of children: Not on file  . Years of education: Not on file  . Highest education level: Not on file  Occupational History  . Not on file  Tobacco Use  . Smoking status: Never Smoker  . Smokeless tobacco: Never Used  . Tobacco comment: Does not smoke.  Vaping Use  . Vaping Use: Never used  Substance and Sexual Activity  . Alcohol use: No    Alcohol/week: 0.0 standard drinks  . Drug use: No  . Sexual activity: Not Currently    Other Topics Concern  . Not on file  Social History Narrative   HSG. [place] 6 years. Married - '69 - 1 year/divorced. '76 - . No children. Work - mfg/textiles - Designer, multimedia; currently works doing maintenance. [employer] - they have discussed this. Provided packet august '13.                Social Determinants of Health   Financial Resource Strain:   . Difficulty of Paying Living Expenses: Not on file  Food Insecurity:   . Worried About Charity fundraiser in the Last Year: Not on file  . Ran Out of Food in the Last Year: Not on file  Transportation Needs:   . Lack of Transportation (Medical): Not on file  . Lack of Transportation (Non-Medical): Not on file  Physical Activity:   . Days of Exercise per Week: Not on file  . Minutes of Exercise per Session: Not on file  Stress:   . Feeling of Stress : Not on file  Social Connections:   . Frequency of Communication with Friends and Family: Not on file  . Frequency of Social Gatherings with Friends and Family: Not on file  . Attends Religious Services: Not on file  . Active Member of Clubs or Organizations: Not on file  . Attends Archivist Meetings: Not on file  . Marital Status: Not on file  Intimate Partner Violence:   . Fear of Current or Ex-Partner: Not on file  . Emotionally Abused: Not on file  . Physically Abused: Not on file  . Sexually Abused: Not on file    Outpatient Medications Prior to Visit  Medication Sig Dispense Refill  . acetaminophen (TYLENOL) 500 MG tablet Take 500 mg by mouth every 6 (six) hours as needed for moderate pain.    [name] albuterol (VENTOLIN HFA) 108 (90 Base) MCG/ACT inhaler Inhale 2 puffs into the lungs every 4 (four) hours as needed for wheezing or shortness of breath. 6.7 g 1  . amiodarone (PACERONE) 200 MG tablet TAKE 1/2 TABLET BY MOUTH DAILY 45 tablet 3  . amLODipine (NORVASC) 5 MG tablet TAKE 1 TABLET BY MOUTH ONCE DAILY 90 tablet 3  . amoxicillin (AMOXIL) 400 MG/5ML  suspension take 5 teaspoons one hour prior to dental procedure (dose should be amoxicillin 2 gm one hour prior to procedure) 100 mL 0  . amoxicillin (AMOXIL) 500 MG capsule TAKE 4 CAPSULEs BY MOUTH 1 HOUR PRIOR TO DENTAL APPOINTMENT 30 capsule 0  . Ascorbic Acid (VITAMIN C) 1000 MG tablet Take 1,000 mg by mouth daily.      [name] aspirin 81 MG tablet Take 81 mg by mouth daily.      [name]  atorvastatin (LIPITOR) 80 MG tablet TAKE 1/2 TABLET BY MOUTH DAILY 90 tablet 1  . loratadine (CLARITIN) 10 MG tablet Take 10 mg by mouth daily as needed for allergies.     [name] losartan (COZAAR) 100 MG tablet TAKE 1 TABLET BY MOUTH ONCE DAILY 90 tablet 0  . Multiple Vitamins-Minerals (MULTIVITAMIN,TX-MINERALS) tablet Take 1 tablet by mouth daily.      [name] warfarin (COUMADIN) 5 MG tablet TAKE 1/2 TABLET BY MOUTH DAILY EXCEPT 1 TABLET ON MONDAY,WEDNESDAY,AND FRIDAYS OR TAKE AS DIRECTED BY CLINIC 90 tablet 1   No facility-administered medications prior to visit.    Allergies  Allergen Reactions  . Codeine Other (See Comments)    Pt does not remember  . Iodine Swelling    ROS Review of Systems  Neurological: Negative for weakness and numbness.      Objective:    Physical Exam Vitals reviewed.  Constitutional:      Appearance: Normal appearance.  Cardiovascular:     Rate and Rhythm: Normal rate.  Pulmonary:     Effort: Pulmonary effort is normal.     Breath sounds: Normal breath sounds.  Musculoskeletal:     Comments: Left foot and ankle reveals no visible swelling.  No erythema.  No warmth.  No reproducible tenderness at this time.  No Achilles tenderness.  No plantar fascial tenderness.  No bony tenderness.  Good distal foot pulses.  No pain with dorsi flexion, plantar flexion, inversion, or eversion  Neurological:     Mental Status: He is alert.     BP (!) 130/54   Pulse (!) 54   Temp 98.2 F (36.8 C) (Oral)   Ht 5\' 2"  (1.575 m)   Wt 135 lb (61.2 kg)   SpO2 95%   BMI 24.69 kg/m  Wt Readings from  Last 3 Encounters:  08/03/20 135 lb (61.2 kg)  05/01/20 131 lb 6.4 oz (59.6 kg)  01/29/20 136 lb 3.2 oz (61.8 kg)     Health Maintenance Due  Topic Date Due  . INFLUENZA VACCINE  07/05/2020    There are no preventive care reminders to display for this patient.  Lab Results  Component Value Date   TSH 2.520 05/01/2020   Lab Results  Component Value Date   WBC 9.6 05/01/2020   HGB 12.6 (L) 05/01/2020   HCT 35.8 (L) 05/01/2020   MCV 94 05/01/2020   PLT 160 05/01/2020   Lab Results  Component Value Date   NA 137 05/01/2020   K 3.8 05/01/2020   CO2 23 05/01/2020   GLUCOSE 91 05/01/2020   BUN 25 05/01/2020   CREATININE 1.05 05/01/2020   BILITOT 0.5 05/01/2020   ALKPHOS 107 05/01/2020   AST 22 05/01/2020   ALT 26 05/01/2020   PROT 6.3 05/01/2020   ALBUMIN 3.9 05/01/2020   CALCIUM 8.9 05/01/2020   ANIONGAP 7 03/12/2015   GFR 104.41 01/21/2015   Lab Results  Component Value Date   CHOL 147 05/01/2019   Lab Results  Component Value Date   HDL 54 05/01/2019   Lab Results  Component Value Date   LDLCALC 73 05/01/2019   Lab Results  Component Value Date   TRIG 102 05/01/2019   Lab Results  Component Value Date   CHOLHDL 2.7 05/01/2019   Lab Results  Component Value Date   HGBA1C 6.0 (H) 03/06/2015      Assessment & Plan:   Left ankle pain.  Question tendinitis  -Recommend try some icing 20 minutes 3  times daily -Consider trial of over-the-counter diclofenac gel 2-3 times daily as needed -Touch base if symptoms not resolving in the next couple weeks  No orders of the defined types were placed in this encounter.   Follow-up: No follow-ups on file.    [name], MD

## 2020-08-03 NOTE — Progress Notes (Signed)
  Chronic Care Management   Outreach Note  08/03/2020 Name: Jesus [name] MRN: [id number] DOB: Jul 01, 1941  Referred by: [name], MD Reason for referral : No chief complaint on file.   An unsuccessful telephone outreach was attempted today. The patient was referred to the pharmacist for assistance with care management and care coordination.   Follow Up Plan:   Carley Perdue [employer] Scheduler

## 2020-08-03 NOTE — Telephone Encounter (Signed)
Okay for refill?  

## 2020-08-04 ENCOUNTER — Telehealth: Payer: Self-pay | Admitting: Family Medicine

## 2020-08-04 NOTE — Progress Notes (Signed)
  Chronic Care Management   Note  08/04/2020 Name: Jesus [name] MRN: [id number] DOB: Apr 24, 1941  Jesus [name] is a 79 y.o. year old male who is a primary care patient of [name], [name], MD. I reached out to [name] by phone today in response to a referral sent by [name] PCP, [name], MD.   [name] was given information about Chronic Care Management services today including:  1. CCM service includes personalized support from designated clinical staff supervised by his physician, including individualized plan of care and coordination with other care providers 2. 24/7 contact phone numbers for assistance for urgent and routine care needs. 3. Service will only be billed when office clinical staff spend 20 minutes or more in a month to coordinate care. 4. Only one practitioner may furnish and bill the service in a calendar month. 5. The patient may stop CCM services at any time (effective at the end of the month) by phone call to the office staff.   Patient agreed to services and verbal consent obtained.   Follow up plan:   Carley Perdue [employer] Scheduler

## 2020-08-13 ENCOUNTER — Other Ambulatory Visit: Payer: Self-pay

## 2020-08-13 ENCOUNTER — Ambulatory Visit (INDEPENDENT_AMBULATORY_CARE_PROVIDER_SITE_OTHER): Payer: Medicare Other | Admitting: General Practice

## 2020-08-13 DIAGNOSIS — Z952 Presence of prosthetic heart valve: Secondary | ICD-10-CM

## 2020-08-13 DIAGNOSIS — Z7901 Long term (current) use of anticoagulants: Secondary | ICD-10-CM

## 2020-08-13 DIAGNOSIS — I482 Chronic atrial fibrillation, unspecified: Secondary | ICD-10-CM

## 2020-08-13 LAB — POCT INR: INR: 2.4 (ref 2.0–3.0)

## 2020-08-13 NOTE — Patient Instructions (Addendum)
Pre visit review using our clinic review tool, if applicable. No additional management support is needed unless otherwise documented below in the visit note.  Continue to take 1 tablet daily except 1/2 tablet on Mon and Friday.  Re-check in 4 weeks.

## 2020-08-13 NOTE — Progress Notes (Signed)
Medical screening examination/treatment/procedure(s) were performed by non-physician practitioner and as supervising physician I was immediately available for consultation/collaboration. I agree with above. Arhan Mcmanamon, MD   

## 2020-08-17 ENCOUNTER — Other Ambulatory Visit: Payer: Self-pay | Admitting: Family Medicine

## 2020-09-02 DIAGNOSIS — Z961 Presence of intraocular lens: Secondary | ICD-10-CM | POA: Diagnosis not present

## 2020-09-02 DIAGNOSIS — H40052 Ocular hypertension, left eye: Secondary | ICD-10-CM | POA: Diagnosis not present

## 2020-09-08 ENCOUNTER — Other Ambulatory Visit: Payer: Self-pay | Admitting: Family Medicine

## 2020-09-10 ENCOUNTER — Ambulatory Visit (INDEPENDENT_AMBULATORY_CARE_PROVIDER_SITE_OTHER): Payer: Medicare Other | Admitting: General Practice

## 2020-09-10 ENCOUNTER — Other Ambulatory Visit: Payer: Self-pay

## 2020-09-10 DIAGNOSIS — Z7901 Long term (current) use of anticoagulants: Secondary | ICD-10-CM

## 2020-09-10 DIAGNOSIS — I482 Chronic atrial fibrillation, unspecified: Secondary | ICD-10-CM

## 2020-09-10 DIAGNOSIS — Z952 Presence of prosthetic heart valve: Secondary | ICD-10-CM | POA: Diagnosis not present

## 2020-09-10 LAB — POCT INR: INR: 2.9 (ref 2.0–3.0)

## 2020-09-10 NOTE — Progress Notes (Signed)
Medical screening examination/treatment/procedure(s) were performed by non-physician practitioner and as supervising physician I was immediately available for consultation/collaboration. I agree with above. Urijah Raynor, MD   

## 2020-09-10 NOTE — Patient Instructions (Addendum)
Pre visit review using our clinic review tool, if applicable. No additional management support is needed unless otherwise documented below in the visit note.  Change dosage and take 1 tablet daily except 1/2 tablet on Mon Wed and Friday.  Re-check in 4 weeks.

## 2020-09-15 ENCOUNTER — Telehealth: Payer: Self-pay | Admitting: Pharmacist

## 2020-09-15 NOTE — Chronic Care Management (AMB) (Signed)
Chronic Care Management Pharmacy Assistant   Name: Jesus [name]  MRN: [id number] DOB: Feb 20, 1941  Reason for Encounter: Medication Review/Initial Questions for Pharmacist visit on 09/16/2020  Patient Questions: 1. Have you seen any other providers since your last visit? No 2. Any changes in your medications or health? No 3. Any side effects from any medications? No 4. Do you have any symptoms or problems not managed by your medications? No 5. Any concerns about your health right now? No 6. Has your provider asked that you check blood pressure, blood sugar, or follow a special diet at home? No 7. Do you get any type of exercise regularly? Yes, He walks daily for about 20-30 minutes or when the weather permits. 8. Can you think of a goal you would like to reach for your health?  9. Do you have any problems getting your medications? No Is there anything that you would like to discuss during the appointment? No    PCP : [name], MD  Allergies:   Allergies  Allergen Reactions  . Codeine Other (See Comments)    Pt does not remember  . Iodine Swelling    Medications: Outpatient Encounter Medications as of 09/15/2020  Medication Sig  . acetaminophen (TYLENOL) 500 MG tablet Take 500 mg by mouth every 6 (six) hours as needed for moderate pain.  [name] albuterol (VENTOLIN HFA) 108 (90 Base) MCG/ACT inhaler Inhale 2 puffs into the lungs every 4 (four) hours as needed for wheezing or shortness of breath.  [name] amiodarone (PACERONE) 200 MG tablet TAKE 1/2 TABLET BY MOUTH DAILY  . amLODipine (NORVASC) 5 MG tablet TAKE 1 TABLET BY MOUTH ONCE DAILY  . amoxicillin (AMOXIL) 400 MG/5ML suspension take 5 teaspoons one hour prior to dental procedure (dose should be amoxicillin 2 gm one hour prior to procedure)  . amoxicillin (AMOXIL) 500 MG capsule TAKE 4 CAPSULES BY MOUTH 1 HOUR PRIOR TODENTAL APPOINTMENT  . Ascorbic Acid (VITAMIN C) 1000 MG tablet Take 1,000 mg by mouth daily.    [name]  aspirin 81 MG tablet Take 81 mg by mouth daily.    [name] atorvastatin (LIPITOR) 80 MG tablet TAKE 1/2 TABLET BY MOUTH DAILY  . loratadine (CLARITIN) 10 MG tablet Take 10 mg by mouth daily as needed for allergies.   [name] losartan (COZAAR) 100 MG tablet TAKE 1 TABLET BY MOUTH ONCE DAILY  . Multiple Vitamins-Minerals (MULTIVITAMIN,TX-MINERALS) tablet Take 1 tablet by mouth daily.    [name] warfarin (COUMADIN) 5 MG tablet TAKE 1/2 TABLET BY MOUTH DAILY EXCEPT 1 TABLET ON MONDAY,WEDNESDAY,AND FRIDAYS OR TAKE AS DIRECTED BY CLINIC   No facility-administered encounter medications on file as of 09/15/2020.    Current Diagnosis: Patient Active Problem List   Diagnosis Date Noted  . Chronic combined systolic and diastolic CHF (congestive heart failure) ([place]) 04/16/2018  . Long term (current) use of anticoagulants 08/25/2017  . Actinic keratoses 03/21/2017  . Internal hemorrhoid, bleeding 07/01/2015  . Rectal bleeding 06/15/2015  . Internal hemorrhoids 06/15/2015  . Other constipation 06/15/2015  . Chronic anticoagulation-Couamdin 03/12/2015  . S/P TAVR (transcatheter aortic valve replacement) 03/10/2015  . Anemia 09/03/2014  . Encounter for therapeutic drug monitoring 12/31/2013  . Yellow jacket sting 08/06/2013  . Low back pain on right side with sciatica 04/24/2013  . Severe aortic stenosis 08/10/2012  . CAD (coronary artery disease) 08/10/2012  . Left ventricular dysfunction 08/10/2012  . Routine health maintenance 07/12/2012  . BEE STING REACTION, LOCAL 09/21/2010  . BEN  LOC HYPERPLASIA PROS W/UR OBST & OTH LUTS 08/02/2010  . Acute and chronic cholecystitis 02/01/2010  . Abdominal pain, generalized 01/29/2010  . CORONARY ATHEROSCLEROSIS NATIVE CORONARY ARTERY 10/13/2009  . HYPERTHYROIDISM 05/22/2009  . INSOMNIA 05/22/2009  . Hyperlipidemia 04/22/2009  . 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 CAD, AUTOLOGOUS BYPASS GRAFT 10/16/2008  . Essential hypertension 09/07/2007  . Aortic valve  disorder 09/07/2007  . Chronic atrial fibrillation ([place]) 09/07/2007  . GASTROESOPHAGEAL REFLUX DISEASE 09/07/2007  . 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    Goals Addressed   None     Follow-Up:  Pharmacist Review   Maia Breslow, [facility] Assistant [phone]

## 2020-09-16 ENCOUNTER — Ambulatory Visit: Payer: Medicare Other | Admitting: Pharmacist

## 2020-09-16 DIAGNOSIS — I10 Essential (primary) hypertension: Secondary | ICD-10-CM

## 2020-09-16 DIAGNOSIS — I482 Chronic atrial fibrillation, unspecified: Secondary | ICD-10-CM

## 2020-09-16 NOTE — Chronic Care Management (AMB) (Signed)
Chronic Care Management Pharmacy  Name: Jesus KINZLER  MRN: [id number] DOB: 12/17/1940  Initial Planning Appointment: completed 09/15/20  Initial Questions: 1. Have you seen any other providers since your last visit? n/a 2. Any changes in your medicines or health? No   Chief Complaint/ HPI  Jesus [name],  79 y.o. , male presents for their Initial CCM visit with the clinical pharmacist via telephone due to COVID-19 Pandemic.  PCP : [name], MD  Their chronic conditions include: HTN, Afib, HLD, CHF, GERD, pain, dental prophylaxis, allergic rhinitis  Office Visits: -08/03/20 [name], MD: Patient presented with left ankle pain. Patient first noted 8/27. Patient still works about 25 hrs/weel and goes to the gym prior to work. Recommended icing 20 minutes 3 times daily and trial of OTC diclofenac gel. Follow up in a few weeks if not resolved.   -01/29/20 [name], MD: Patient presented for medicare annual wellness visit. No medication changes made. Discussed fall prevention.  Consult Visit: -09/10/20 Meriam Sprague, RN (cardiology): Patient presented for INR check. INR 2.9, goal 2-3. Warfarin dose 2.5 mg every Mon, Wed, Fri and 5 mg on all other days. Recheck in 4 weeks.  -05/01/20 [name], MD (cardiology): Patient presented for afib, CAD and aortic valve disorder follow up. CBC stable and TSH and CMP WNL. BP elevated in office. Echo was reviewed and heart valve function was unchanged. Follow up in 6 months.  -02/27/20 Shon Hough (ophthalmology): Patient presented for follow up for ocular hypertension. Unable to access notes.  Medications: Outpatient Encounter Medications as of 09/16/2020  Medication Sig  . acetaminophen (TYLENOL) 500 MG tablet Take 500 mg by mouth every 6 (six) hours as needed for moderate pain.  [name] amiodarone (PACERONE) 200 MG tablet TAKE 1/2 TABLET BY MOUTH DAILY  . amLODipine (NORVASC) 5 MG tablet TAKE 1 TABLET BY MOUTH ONCE  DAILY  . amoxicillin (AMOXIL) 400 MG/5ML suspension take 5 teaspoons one hour prior to dental procedure (dose should be amoxicillin 2 gm one hour prior to procedure)  . amoxicillin (AMOXIL) 500 MG capsule TAKE 4 CAPSULES BY MOUTH 1 HOUR PRIOR TODENTAL APPOINTMENT  . Ascorbic Acid (VITAMIN C) 1000 MG tablet Take 1,000 mg by mouth daily.    [name] aspirin 81 MG tablet Take 81 mg by mouth daily.    [name] atorvastatin (LIPITOR) 80 MG tablet TAKE 1/2 TABLET BY MOUTH DAILY  . loratadine (CLARITIN) 10 MG tablet Take 10 mg by mouth daily as needed for allergies.   [name] losartan (COZAAR) 100 MG tablet TAKE 1 TABLET BY MOUTH ONCE DAILY  . Multiple Vitamins-Minerals (MULTIVITAMIN,TX-MINERALS) tablet Take 1 tablet by mouth daily.    . [DISCONTINUED] warfarin (COUMADIN) 5 MG tablet TAKE 1/2 TABLET BY MOUTH DAILY EXCEPT 1 TABLET ON MONDAY,WEDNESDAY,AND FRIDAYS OR TAKE AS DIRECTED BY CLINIC  . albuterol (VENTOLIN HFA) 108 (90 Base) MCG/ACT inhaler Inhale 2 puffs into the lungs every 4 (four) hours as needed for wheezing or shortness of breath.   No facility-administered encounter medications on file as of 09/16/2020.   Patient reports during a typical day, he gets up at 5 am, goes to gym, and then goes to work from 7:30-12. 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  While at the gym, the patient goes on the for treadmill 25 minutes for a fast walk and has been doing this for about 6 months. He used to go  for walks outdoors with his dog who passed away 6 months ago.  Patient lives with wife and both of them still drive. He has a brother in [place] that he sees once a week. His wife does the cooking and cleaning and his go to meat is typically chicken. They go out to eat about once a week.  Patient reports sleeping each night for about 6-7 hours and gets up 2-3 times during that time to go to the bathroom. He does feel rested and has no problems falling  asleep or staying asleep.  Patient denies any problems with his medicines at the time.  Current Diagnosis/Assessment:  Goals Addressed            This Visit's Progress   . Pharmacy Care Plan       CARE PLAN ENTRY (see longitudinal plan of care for additional care plan information)  Current Barriers:  . Chronic Disease Management support, education, and care coordination needs related to Hypertension, Hyperlipidemia, Atrial Fibrillation, Heart Failure, and GERD   Hypertension BP Readings from Last 3 Encounters:  08/03/20 (!) 130/54  05/01/20 (!) 146/60  01/29/20 120/70   . Pharmacist Clinical Goal(s): o Over the next 90 days, patient will work with PharmD and providers to achieve BP goal <130/80 . Current regimen:  . Amlodipine 5 mg 1 tablet daily  . Losartan 100 mg 1 tablet daily . Interventions: o We discussed the importance of checking blood pressure regularly to make sure medications are working properly. o We discussed looking at nutrition labels for sodium content and avoiding adding salt to food or using a salt substitute . Patient self care activities - Over the next 90 days, patient will: o Check blood pressure weekly, document, and provide at future appointments o Ensure daily salt intake < 2300 mg/day  Hyperlipidemia Lab Results  Component Value Date/Time   LDLCALC 73 05/01/2019 09:03 AM   . Pharmacist Clinical Goal(s): o Over the next 90 days, patient will work with PharmD and providers to achieve LDL goal < 70 . Current regimen:  o Atorvastatin 80 mg 1/2 tablet daily . Interventions: o We discussed increasing fiber intake to 10-25 g/day in order to lower cholesterol . Patient self care activities - Over the next 90 days, patient will: o Continue current medications  Afib . Pharmacist Clinical Goal(s): o Over the next 90 days, patient will work with PharmD and providers to maintain heart rate between 60-100 beats per minute . Current regimen:   . Amiodarone 200 mg 1/2 tablet daily . Warfarin 2.5 mg every Mon, Wed, Fri and 5 mg on all other days  . Interventions: o Discussed monitoring for unexpected bruising and bleeding such as nosebleeds or blood in urine or stool . Patient self care activities - Over the next 90 days, patient will: o Check heart rate weekly, document, and provide at future appointments o Contact provider with any episodes of bleeding  Heart failure . Pharmacist Clinical Goal(s) o Over the next 90 days, patient will work with PharmD and providers to manage fluid . Current regimen:  . Losartan 100 mg 1 tablet daily . Amiodarone 200 mg 1/2 tablet daily . Interventions: o Discussed the importance of maintaining a low sodium diet in order  to avoid fluid build up . Patient self care activities - Over the next 90 days, patient will: o Continue current medications  GERD . Pharmacist Clinical Goal(s) o Over the next 90 days, patient will work with PharmD and providers to  manage symptoms of heartburn . Current regimen:  o Pepcid or Zantac as needed . Interventions: o Discussed that he can switch back to Zantac as this is a newly tested product and no longer unsafe to take with the recall . Patient self care activities - Over the next 90 days, patient will: o Continue current medications  Medication management . Pharmacist Clinical Goal(s): o Over the next 90 days, patient will work with PharmD and providers to maintain optimal medication adherence . Current pharmacy: [facility] . Interventions o Comprehensive medication review performed. o Continue current medication management strategy . Patient self care activities - Over the next 90 days, patient will: o Take medications as prescribed o Report any questions or concerns to PharmD and/or provider(s)  Initial goal documentation        Hypertension   BP goal is:  <130/80  Office blood pressures are  BP Readings from Last 3 Encounters:   08/03/20 (!) 130/54  05/01/20 (!) 146/60  01/29/20 120/70   Patient checks BP at home when feeling symptomatic Patient home BP readings are ranging: n/a   Patient has failed these meds in the past:  Patient is currently uncontrolled on the following medications:  . Amlodipine 5 mg 1 tablet daily - in AM  . Losartan 100 mg 1 tablet daily - in AM  We discussed diet and exercise extensively and the importance of checking blood pressure at home  -Diet: patient does not use much salt at home and tries to use more pepper for food such as tomatoes tomato -Exercise  Plan  Continue current medications     AFIB   Patient is currently rate controlled. Office heart rates are  Pulse Readings from Last 3 Encounters:  08/03/20 (!) 54  05/01/20 60  01/29/20 60    CHA2DS2-VASc Score =  4  The patient's score is based upon: age > 12, HTN, CHF  Patient has failed these meds in past: none Patient is currently controlled on the following medications:  . Amiodarone 200 mg 1/2 tablet daily . Warfarin 2.5 mg every Mon, Wed, Fri and 5 mg on all other days - at dinner  We discussed:  monitoring for unexpected bruising and bleeding (nosebleeds, blood in urine or stool)  Plan  Continue current medications  Hyperlipidemia   LDL goal < 70  Lipid Panel     Component Value Date/Time   CHOL 147 05/01/2019 0903   TRIG 102 05/01/2019 0903   TRIG 67 11/20/2006 0748   HDL 54 05/01/2019 0903   LDLCALC 73 05/01/2019 0903    Hepatic Function Latest Ref Rng & Units 05/01/2020 11/04/2019 05/01/2019  Total Protein 6.0 - 8.5 g/dL 6.3 6.6 6.6  Albumin 3.6 - 4.6 g/dL 3.9 4.2 4.3  AST 0 - 40 IU/L _0 ALT 0 - 44 IU/L _1 Alk Phosphatase 48 - 121 IU/L 107 123(H) 113  Total Bilirubin 0.0 - 1.2 mg/dL 0.5 0.7 0.9  Bilirubin, Direct 0.00 - 0.40 mg/dL - - -     The ASCVD Risk score (Lindsborg., et al., 2013) failed to calculate for the following reasons:   The 2013 ASCVD risk score is  only valid for ages 15 to 40   Patient has failed these meds in past: unknown Patient is currently uncontrolled on the following medications:  . Atorvastatin 80 mg 1/2 tablet daily  We discussed:  diet and exercise extensively  -Exercise -Diet: discussed making sure to increase  fiber intake to 10-25 g/day in order to help lower cholesterol  Plan Recommend repeat lipid panel. May need to increase statin to lower LDL to goal < 70. Continue current medications  CAD   Patient has failed these meds in past: none Patient is currently controlled on the following medications:  . Aspirin 81 mg 1 tablet daily . Atorvastatin 80 mg 1/2 tablet daily  Plan  Continue current medications    Heart Failure   Type: Left Ventricular Failure  Last ejection fraction: 40-45% (05/01/20) NYHA Class: II (slight limitation of activity) AHA HF Stage: B (Heart disease present - no symptoms present)  BP goal is:  <130/80 BP Readings from Last 3 Encounters:  08/03/20 (!) 130/54  05/01/20 (!) 146/60  01/29/20 120/70   Kidney Function Lab Results  Component Value Date/Time   CREATININE 1.05 05/01/2020 02:46 PM   CREATININE 0.91 11/04/2019 09:40 AM   CREATININE 0.91 09/12/2016 01:11 PM   CREATININE 0.88 03/11/2016 07:34 AM   GFR 104.41 01/21/2015 08:27 AM   GFRNONAA 66 05/01/2020 02:46 PM   GFRAA 77 05/01/2020 02:46 PM   K 3.8 05/01/2020 02:46 PM   K 4.5 11/04/2019 09:40 AM   Patient is currently controlled on the following medications:  . Losartan 100 mg 1 tablet daily . Amiodarone 200 mg 1/2 tablet daily  We discussed monitoring for fluid build up  Plan  Continue current medications   GERD   Patient is currently controlled on the following medications:  . Tums PRN . Pepcid 20 mg PRN . Zantac 10 mg PRN  We discussed: Pepcid vs zantac and availability (patient was taking Zantac prior to recall and switched to Pepcid); Non pharmacologic management (elevated head of bed, eating 2-3  hours before bedtime)   Plan  Continue current medications    Allergic rhinitis   Patient has failed these meds in past: none Patient is currently controlled on the following medications:  . Claritin 10 mg 1 tablet daily PRN - patient takes about 3 x month  We discussed:  Avoiding antihistamines such as Benadryl due to risks of falls in older adults  Plan  Continue current medications  Pain   Patient is currently controlled on the following medications:  . Tylenol 500 mg daily (patient takes 2 x month)  We discussed:   Avoiding NSAIDs for pain while taking anticoagulants; maximum daily dose of Tylenol of 3,000 mg/day  Plan  Continue current medications   Dental prophylaxis   Patient is currently on the following medications:  . Amoxicillin 500 mg 4 capsules . Amoxicillin suspension   Plan  Continue current medications   Vitamins/supplements  Patient is currently on the following medications:  [name] Multivitamin 1 tablet daily . Vitamin C 1000 mg 1 tablet daily   Plan  Continue current medications  Vaccines   Reviewed and discussed patient's vaccination history.    Immunization History  Administered Date(s) Administered  . Fluad Quad(high Dose 65+) 10/04/2019  . Influenza Whole 09/05/2001, 09/26/2008, 09/26/2009, 09/14/2010  . Influenza, High Dose Seasonal PF 08/12/2015, 09/02/2016, 08/25/2017, 09/07/2018  . Influenza,inj,Quad PF,6+ Mos 08/27/2014  . Influenza-Unspecified 09/12/2012, 08/16/2013  . PFIZER SARS-COV-2 Vaccination 01/06/2020, 01/20/2020  . Pneumococcal Conjugate-13 04/07/2014  . Pneumococcal Polysaccharide-23 09/05/2001, 01/14/2010  . Td 01/06/1999  . Tetanus 05/15/2013  . Zoster 12/14/2011  . Zoster Recombinat (Shingrix) 09/06/2019   Patient thinks he received his second Shingrix dose but will clarify with the pharmacy.   Plan  Recommended patient receive influenza vaccine in  office.   Medication Management   Pt uses [facility] for all medications Uses pill box? Yes - 1 week at a time Pt endorses 100% compliance  We discussed: Current pharmacy is preferred with insurance plan and patient is satisfied with pharmacy services  Plan  Continue current medication management strategy  Follow up: 3 month phone visit  [name], PharmD Clinical Pharmacist [place] [facility] [place] [phone]

## 2020-09-18 ENCOUNTER — Telehealth: Payer: Self-pay

## 2020-09-18 DIAGNOSIS — E785 Hyperlipidemia, unspecified: Secondary | ICD-10-CM

## 2020-09-18 DIAGNOSIS — I10 Essential (primary) hypertension: Secondary | ICD-10-CM

## 2020-09-18 NOTE — Telephone Encounter (Signed)
-----   Message from [name], [facility] sent at 09/18/2020  2:00 PM EDT ----- Regarding: CCM referral Hi Librada Castronovo,   Can you please put in a CCM referral for [name]?  Thank you, Maddie  [name], PharmD Clinical Pharmacist [facility] at [place] [phone]

## 2020-09-28 ENCOUNTER — Other Ambulatory Visit: Payer: Self-pay | Admitting: Family Medicine

## 2020-09-28 DIAGNOSIS — Z7901 Long term (current) use of anticoagulants: Secondary | ICD-10-CM

## 2020-09-29 NOTE — Patient Instructions (Addendum)
Hi Jesus [name],  It was so lovely to get to meet you over the phone! As discussed, please start checking your blood pressure more regularly to make sure your medications are working and I attached some information for the best way to do so.  Please give me a call if you need anything or have questions before our next touch base!  Best, Maddie  Jesus [name], PharmD Clinical Pharmacist [place] [facility] [place]   Visit Information  Goals Addressed            This Visit's Progress   . Pharmacy Care Plan       CARE PLAN ENTRY (see longitudinal plan of care for additional care plan information)  Current Barriers:  . Chronic Disease Management support, education, and care coordination needs related to Hypertension, Hyperlipidemia, Atrial Fibrillation, Heart Failure, and GERD   Hypertension BP Readings from Last 3 Encounters:  08/03/20 (!) 130/54  05/01/20 (!) 146/60  01/29/20 120/70   . Pharmacist Clinical Goal(s): o Over the next 90 days, patient will work with PharmD and providers to achieve BP goal <130/80 . Current regimen:  . Amlodipine 5 mg 1 tablet daily  . Losartan 100 mg 1 tablet daily . Interventions: o We discussed the importance of checking blood pressure regularly to make sure medications are working properly. o We discussed looking at nutrition labels for sodium content and avoiding adding salt to food or using a salt substitute . Patient self care activities - Over the next 90 days, patient will: o Check blood pressure weekly, document, and provide at future appointments o Ensure daily salt intake < 2300 mg/day  Hyperlipidemia Lab Results  Component Value Date/Time   LDLCALC 73 05/01/2019 09:03 AM   . Pharmacist Clinical Goal(s): o Over the next 90 days, patient will work with PharmD and providers to achieve LDL goal < 70 . Current regimen:  o Atorvastatin 80 mg 1/2 tablet daily . Interventions: o We discussed increasing fiber intake  to 10-25 g/day in order to lower cholesterol . Patient self care activities - Over the next 90 days, patient will: o Continue current medications  Afib . Pharmacist Clinical Goal(s): o Over the next 90 days, patient will work with PharmD and providers to maintain heart rate between 60-100 beats per minute . Current regimen:  . Amiodarone 200 mg 1/2 tablet daily . Warfarin 2.5 mg every Mon, Wed, Fri and 5 mg on all other days  . Interventions: o Discussed monitoring for unexpected bruising and bleeding such as nosebleeds or blood in urine or stool . Patient self care activities - Over the next 90 days, patient will: o Check heart rate weekly, document, and provide at future appointments o Contact provider with any episodes of bleeding  Heart failure . Pharmacist Clinical Goal(s) o Over the next 90 days, patient will work with PharmD and providers to manage fluid . Current regimen:  . Losartan 100 mg 1 tablet daily . Amiodarone 200 mg 1/2 tablet daily . Interventions: o Discussed the importance of maintaining a low sodium diet in order  to avoid fluid build up . Patient self care activities - Over the next 90 days, patient will: o Continue current medications  GERD . Pharmacist Clinical Goal(s) o Over the next 90 days, patient will work with PharmD and providers to manage symptoms of heartburn . Current regimen:  o Pepcid or Zantac as needed . Interventions: o Discussed that he can switch back to Zantac as this is a newly  tested product and no longer unsafe to take with the recall . Patient self care activities - Over the next 90 days, patient will: o Continue current medications  Medication management . Pharmacist Clinical Goal(s): o Over the next 90 days, patient will work with PharmD and providers to maintain optimal medication adherence . Current pharmacy: [facility] . Interventions o Comprehensive medication review performed. o Continue current medication management  strategy . Patient self care activities - Over the next 90 days, patient will: o Take medications as prescribed o Report any questions or concerns to PharmD and/or provider(s)  Initial goal documentation        Jesus [name] was given information about Chronic Care Management services today including:  1. CCM service includes personalized support from designated clinical staff supervised by his physician, including individualized plan of care and coordination with other care providers 2. 24/7 contact phone numbers for assistance for urgent and routine care needs. 3. Standard insurance, coinsurance, copays and deductibles apply for chronic care management only during months in which we provide at least 20 minutes of these services. Most insurances cover these services at 100%, however patients may be responsible for any copay, coinsurance and/or deductible if applicable. This service may help you avoid the need for more expensive face-to-face services. 4. Only one practitioner may furnish and bill the service in a calendar month. 5. The patient may stop CCM services at any time (effective at the end of the month) by phone call to the office staff.  Patient agreed to services and verbal consent obtained.   The patient verbalized understanding of instructions provided today and agreed to receive a mailed copy of patient instruction and/or educational materials. Telephone follow up appointment with pharmacy team member scheduled for: 3 months   How to Take Your Blood Pressure You can take your blood pressure at home with a machine. You may need to check your blood pressure at home:  To check if you have high blood pressure (hypertension).  To check your blood pressure over time.  To make sure your blood pressure medicine is working. Supplies needed: You will need a blood pressure machine, or monitor. You can buy one at a drugstore or online. When choosing one:  Choose one with an arm  cuff.  Choose one that wraps around your upper arm. Only one finger should fit between your arm and the cuff.  Do not choose one that measures your blood pressure from your wrist or finger. Your doctor can suggest a monitor. How to prepare Avoid these things for 30 minutes before checking your blood pressure:  Drinking caffeine.  Drinking alcohol.  Eating.  Smoking.  Exercising. Five minutes before checking your blood pressure:  Pee.  Sit in a dining chair. Avoid sitting in a soft couch or armchair.  Be quiet. Do not talk. How to take your blood pressure Follow the instructions that came with your machine. If you have a digital blood pressure monitor, these may be the instructions: 1. Sit up straight. 2. Place your feet on the floor. Do not cross your ankles or legs. 3. Rest your left arm at the level of your heart. You may rest it on a table, desk, or chair. 4. Pull up your shirt sleeve. 5. Wrap the blood pressure cuff around the upper part of your left arm. The cuff should be 1 inch (2.5 cm) above your elbow. It is best to wrap the cuff around bare skin. 6. Fit the cuff snugly around  your arm. You should be able to place only one finger between the cuff and your arm. 7. Put the cord inside the groove of your elbow. 8. Press the power button. 9. Sit quietly while the cuff fills with air and loses air. 10. Write down the numbers on the screen. 11. Wait 2-3 minutes and then repeat steps 1-10. What do the numbers mean? Two numbers make up your blood pressure. The first number is called systolic pressure. The second is called diastolic pressure. An example of a blood pressure reading is "120 over 80" (or 120/80). If you are an adult and do not have a medical condition, use this guide to find out if your blood pressure is normal: Normal  First number: below 120.  Second number: below 80. Elevated  First number: 120-129.  Second number: below 80. Hypertension stage  1  First number: 130-139.  Second number: 80-89. Hypertension stage 2  First number: 140 or above.  Second number: 48 or above. Your blood pressure is above normal even if only the top or bottom number is above normal. Follow these instructions at home:  Check your blood pressure as often as your doctor tells you to.  Take your monitor to your next doctor's appointment. Your doctor will: ? Make sure you are using it correctly. ? Make sure it is working right.  Make sure you understand what your blood pressure numbers should be.  Tell your doctor if your medicines are causing side effects. Contact a doctor if:  Your blood pressure keeps being high. Get help right away if:  Your first blood pressure number is higher than 180.  Your second blood pressure number is higher than 120. This information is not intended to replace advice given to you by your health care provider. Make sure you discuss any questions you have with your health care provider. Document Revised: 11/03/2017 Document Reviewed: 04/29/2016 Elsevier Patient Education  2020 [employer].

## 2020-10-08 ENCOUNTER — Ambulatory Visit (INDEPENDENT_AMBULATORY_CARE_PROVIDER_SITE_OTHER): Payer: Medicare Other | Admitting: General Practice

## 2020-10-08 ENCOUNTER — Other Ambulatory Visit: Payer: Self-pay

## 2020-10-08 DIAGNOSIS — I482 Chronic atrial fibrillation, unspecified: Secondary | ICD-10-CM

## 2020-10-08 DIAGNOSIS — Z952 Presence of prosthetic heart valve: Secondary | ICD-10-CM | POA: Diagnosis not present

## 2020-10-08 DIAGNOSIS — Z7901 Long term (current) use of anticoagulants: Secondary | ICD-10-CM

## 2020-10-08 DIAGNOSIS — Z23 Encounter for immunization: Secondary | ICD-10-CM | POA: Diagnosis not present

## 2020-10-08 LAB — POCT INR: INR: 2 (ref 2.0–3.0)

## 2020-10-08 NOTE — Progress Notes (Signed)
Medical screening examination/treatment/procedure(s) were performed by non-physician practitioner and as supervising physician I was immediately available for consultation/collaboration. I agree with above. Taffy Delconte, MD   

## 2020-10-08 NOTE — Patient Instructions (Addendum)
Pre visit review using our clinic review tool, if applicable. No additional management support is needed unless otherwise documented below in the visit note.  Continue to take 1 tablet daily except 1/2 tablet on Mon Wed and Friday.  Re-check in 4 weeks.

## 2020-10-20 ENCOUNTER — Other Ambulatory Visit: Payer: Self-pay | Admitting: Family Medicine

## 2020-10-20 NOTE — Telephone Encounter (Signed)
Refill once OK. 

## 2020-10-22 ENCOUNTER — Other Ambulatory Visit: Payer: Self-pay | Admitting: Family Medicine

## 2020-10-22 NOTE — Telephone Encounter (Signed)
Looks like he had the Amoxicillin tablets sent in yesterday.  Need to confirm with pt if he is needing liquid vs pill.

## 2020-11-04 ENCOUNTER — Encounter: Payer: Self-pay | Admitting: Cardiovascular Disease

## 2020-11-04 ENCOUNTER — Other Ambulatory Visit: Payer: Self-pay

## 2020-11-04 ENCOUNTER — Ambulatory Visit: Payer: Medicare Other | Admitting: Cardiovascular Disease

## 2020-11-04 VITALS — BP 132/60 | HR 61 | Ht 61.0 in | Wt 134.0 lb

## 2020-11-04 DIAGNOSIS — I480 Paroxysmal atrial fibrillation: Secondary | ICD-10-CM | POA: Diagnosis not present

## 2020-11-04 DIAGNOSIS — I359 Nonrheumatic aortic valve disorder, unspecified: Secondary | ICD-10-CM | POA: Diagnosis not present

## 2020-11-04 DIAGNOSIS — E782 Mixed hyperlipidemia: Secondary | ICD-10-CM

## 2020-11-04 DIAGNOSIS — I2510 Atherosclerotic heart disease of native coronary artery without angina pectoris: Secondary | ICD-10-CM

## 2020-11-04 DIAGNOSIS — I5042 Chronic combined systolic (congestive) and diastolic (congestive) heart failure: Secondary | ICD-10-CM

## 2020-11-04 NOTE — Progress Notes (Signed)
Cardiology Office Note:    Date:  11/04/2020   ID:  Jesus [name], DOB 11-Nov-1941, MRN [id number]  PCP:  [name], MD  [facility] Cardiologist:  [name], MD  [place] Electrophysiologist:  None   Referring MD: [name], MD   Chief Complaint  Patient presents with  . Atrial Fibrillation    History of Present Illness:    Jesus [name] is a 79 y.o. male with a hx of coronary artery disease with remote MI, aortic valve disease status post TAVR, and persistent atrial fibrillation maintaining sinus rhythm on long-term amiodarone.  The patient is here alone today. He's been getting along well. Today, he denies symptoms of palpitations, chest pain, shortness of breath, orthopnea, PND, lower extremity edema, dizziness, or syncope.  He reports no changes in his medications.     Past Medical History:  Diagnosis Date  . Acute myocardial infarction of inferior wall ([place]) 1980  . Aortic stenosis    mild with a mean aortic valve gradient of 12 mmHg  . Atrial fibrillation (HCC)    holding sinus rhythm on Amiodarone  . CAD (coronary artery disease)    a. S/P Ant MI 1980;  b. 1997 S/P CABG x 8 (LIMA to diag-LAD, SVG to OM1-OM2-OM3, SVG to [facility] - Dr [name]);  c. 01/2015 Cath: 3VD, 8/8 patent grafts.  . Cardiomyopathy   . Dysrhythmia   . Esophageal reflux   . GERD (gastroesophageal reflux disease)   . Headache   . History of colonoscopy   . History of transesophageal echocardiography (TEE) for monitoring   . Other and unspecified hyperlipidemia   . S/P TAVR (transcatheter aortic valve replacement)    a. 03/2015 26 mm Edwards Sapien 3 transcatheter heart valve placed via open left transfemoral approach.  . Skin lesions, generalized    facial which [name] represent actinic keratoses and possible photosensitivity from Amiodarone  . Unspecified essential hypertension     Past Surgical History:  Procedure Laterality Date  . CARDIAC CATHETERIZATION     . CARDIOVERSION  11/18/2006   Dr. [name]  . CATARACT EXTRACTION W/ INTRAOCULAR LENS IMPLANT  April '13  (Dr. [name])   left eye only  . CHOLECYSTECTOMY    . CORONARY ARTERY BYPASS GRAFT  02/09/1996   LIMA to diag-LAD, SVG to OM1-OM2-OM3, SVG to [facility]  . EYE SURGERY    . LEFT AND RIGHT HEART CATHETERIZATION WITH CORONARY/GRAFT ANGIOGRAM N/A 01/26/2015   Procedure: LEFT AND RIGHT HEART CATHETERIZATION WITH [name];  Surgeon: [name], MD;  Location: [facility] CATH LAB;  Service: Cardiovascular;  Laterality: N/A;  . TEE WITHOUT CARDIOVERSION N/A 03/10/2015   Procedure: TRANSESOPHAGEAL ECHOCARDIOGRAM (TEE);  Surgeon: [name], MD;  Location: [place];  Service: Open Heart Surgery;  Laterality: N/A;  . TONSILLECTOMY    . TRANSCATHETER AORTIC VALVE REPLACEMENT, TRANSFEMORAL N/A 03/10/2015   Procedure: TRANSCATHETER AORTIC VALVE REPLACEMENT, TRANSFEMORAL;  Surgeon: [name], MD;  Location: [place];  Service: Open Heart Surgery;  Laterality: N/A;    Current Medications: Current Meds  Medication Sig  . acetaminophen (TYLENOL) 500 MG tablet Take 500 mg by mouth every 6 (six) hours as needed for moderate pain.  [name] albuterol (VENTOLIN HFA) 108 (90 Base) MCG/ACT inhaler Inhale 2 puffs into the lungs every 4 (four) hours as needed for wheezing or shortness of breath.  [name] amiodarone (PACERONE) 200 MG tablet TAKE 1/2 TABLET BY MOUTH DAILY  . amLODipine (NORVASC) 5 MG tablet TAKE 1 TABLET  BY MOUTH ONCE DAILY  . Ascorbic Acid (VITAMIN C) 1000 MG tablet Take 1,000 mg by mouth daily.    [name] aspirin 81 MG tablet Take 81 mg by mouth daily.    [name] atorvastatin (LIPITOR) 80 MG tablet TAKE 1/2 TABLET BY MOUTH DAILY  . loratadine (CLARITIN) 10 MG tablet Take 10 mg by mouth daily as needed for allergies.   [name] losartan (COZAAR) 100 MG tablet TAKE 1 TABLET BY MOUTH ONCE DAILY  . Multiple Vitamins-Minerals (MULTIVITAMIN,TX-MINERALS) tablet Take 1 tablet by mouth daily.    [name] warfarin  (COUMADIN) 5 MG tablet TAKE 1/2 TABLET BY MOUTH DAILY EXCEPT 1 TABLET ON MONDAY,WEDNESDAY,AND FRIDAYS OR TAKE AS DIRECTED BY CLINIC     Allergies:   Codeine and Iodine   Social History   Socioeconomic History  . Marital status: Married    Spouse name: Not on file  . Number of children: Not on file  . Years of education: Not on file  . Highest education level: Not on file  Occupational History  . Not on file  Tobacco Use  . Smoking status: Never Smoker  . Smokeless tobacco: Never Used  . Tobacco comment: Does not smoke.  Vaping Use  . Vaping Use: Never used  Substance and Sexual Activity  . Alcohol use: No    Alcohol/week: 0.0 standard drinks  . Drug use: No  . Sexual activity: Not Currently  Other Topics Concern  . Not on file  Social History Narrative   HSG. [employer] 6 years. Married - '69 - 1 year/divorced. '76 - . No children. Work - mfg/textiles - Designer, multimedia; currently works doing maintenance. [employer] - they have discussed this. Provided packet august '13.                Social Determinants of Health   Financial Resource Strain:   . Difficulty of Paying Living Expenses: Not on file  Food Insecurity:   . Worried About Charity fundraiser in the Last Year: Not on file  . Ran Out of Food in the Last Year: Not on file  Transportation Needs:   . Lack of Transportation (Medical): Not on file  . Lack of Transportation (Non-Medical): Not on file  Physical Activity:   . Days of Exercise per Week: Not on file  . Minutes of Exercise per Session: Not on file  Stress:   . Feeling of Stress : Not on file  Social Connections:   . Frequency of Communication with Friends and Family: Not on file  . Frequency of Social Gatherings with Friends and Family: Not on file  . Attends Religious Services: Not on file  . Active Member of Clubs or Organizations: Not on file  . Attends Archivist Meetings: Not on file  . Marital Status: Not on file     Family  History: The patient's family history includes Atrial fibrillation in his brother; Crohn's disease in his mother; Heart attack (age of onset: 67) in his father; Heart disease in his father and paternal uncle; Heart failure in his father; Prostate cancer in his paternal uncle; Stroke (age of onset: 77) in his mother. There is no history of Colon cancer.  ROS:   Please see the history of present illness.    All other systems reviewed and are negative.  EKGs/Labs/Other Studies Reviewed:    The following studies were reviewed today: Echo 05/01/2020: IMPRESSIONS    1. Left ventricular ejection fraction, by estimation, is 40 to  45%. The  left ventricle has mildly decreased function. The left ventricle  demonstrates global hypokinesis. There is mild left ventricular  hypertrophy. Left ventricular diastolic parameters  are consistent with Grade I diastolic dysfunction (impaired relaxation).  2. Right ventricular systolic function is normal. The right ventricular  size is normal.  3. Left atrial size was moderately dilated.  4. The mitral valve is normal in structure. Mild mitral valve  regurgitation. No evidence of mitral stenosis.  5. Mild to moderate perivalvular leak adjacent to mitral annulus. The  aortic valve has been repaired/replaced. Aortic valve regurgitation is not  visualized. No aortic stenosis is present. There is a 26 mm Edwards Sapien  prosthetic (TAVR) valve present  in the aortic position. Aortic valve mean gradient measures 14.0 mmHg.  Aortic valve Vmax measures 2.61 m/s.  6. The inferior vena cava is normal in size with greater than 50%  respiratory variability, suggesting right atrial pressure of 3 mmHg.   Comparison(s): No significant change from prior study. Prior images  reviewed side by side.   EKG:  EKG is ordered today.  The ekg ordered today demonstrates NSR 61 bpm, age indeterminate inferior MI unchanged from prior tracings  Recent Labs: 05/01/2020: ALT  26; BUN 25; Creatinine, Ser 1.05; Hemoglobin 12.6; Platelets 160; Potassium 3.8; Sodium 137; TSH 2.520  Recent Lipid Panel    Component Value Date/Time   CHOL 147 05/01/2019 0903   TRIG 102 05/01/2019 0903   TRIG 67 11/20/2006 0748   HDL 54 05/01/2019 0903   CHOLHDL 2.7 05/01/2019 0903   CHOLHDL 3.0 09/12/2016 1311   VLDL 24 09/12/2016 1311   LDLCALC 73 05/01/2019 0903     Risk Assessment/Calculations:     CHA2DS2-VASc Score = 5  This indicates a 7.2% annual risk of stroke. The patient's score is based upon: CHF History: 1 HTN History: 1 Diabetes History: 0 Stroke History: 0 Vascular Disease History: 1 Age Score: 2 Gender Score: 0      Physical Exam:    VS:  BP 132/60   Pulse 61   Ht 5\' 1"  (1.549 m)   Wt 134 lb (60.8 kg)   BMI 25.32 kg/m     Wt Readings from Last 3 Encounters:  11/04/20 134 lb (60.8 kg)  08/03/20 135 lb (61.2 kg)  05/01/20 131 lb 6.4 oz (59.6 kg)     GEN: Well nourished, well developed in no acute distress HEENT: Normal NECK: No JVD; No carotid bruits LYMPHATICS: No lymphadenopathy CARDIAC: RRR, 2/6 systolic murmur at the right upper sternal border and 2/6 diastolic decrescendo murmur at the left lower sternal border RESPIRATORY:  Clear to auscultation without rales, wheezing or rhonchi  ABDOMEN: Soft, non-tender, non-distended MUSCULOSKELETAL:  No edema; No deformity  SKIN: Warm and dry NEUROLOGIC:  Alert and oriented x 3 PSYCHIATRIC:  Normal affect   ASSESSMENT:    1. Paroxysmal atrial fibrillation (HCC)   2. Coronary artery disease involving native coronary artery of native heart without angina pectoris   3. Aortic valve disorder   4. Chronic combined systolic and diastolic CHF (congestive heart failure) ([place])   5. Mixed hyperlipidemia    PLAN:    In order of problems listed above:  1. Patient is maintaining sinus rhythm on amiodarone.  He tolerates warfarin long-term without any bleeding complications.  We will check  amiodarone monitoring labs today with a TSH and liver function test.  He will return for follow-up in 6 months. 2. Doing very well with no anginal  symptoms.  Stable LV function by most recent echo.  No changes made. 3. Status post TAVR with normal transvalvular gradients and mild to moderate paravalvular regurgitation.  Continue to follow with annual echo studies.  Completely asymptomatic. 4. Doing well on current medical therapy. 5. Lipids at goal on atorvastatin 40 mg daily.  Last lipids showed a cholesterol 147, HDL 54, LDL 73.    Shared Decision Making/Informed Consent        Medication Adjustments/Labs and Tests Ordered: Current medicines are reviewed at length with the patient today.  Concerns regarding medicines are outlined above.  Orders Placed This Encounter  Procedures  . Comprehensive metabolic panel  . TSH  . CBC with Differential/Platelet  . TSH  . Lipid panel  . Comprehensive metabolic panel  . EKG 12-Lead  . ECHOCARDIOGRAM COMPLETE   No orders of the defined types were placed in this encounter.   Patient Instructions  Medication Instructions:  Your provider recommends that you continue on your current medications as directed. Please refer to the Current Medication list given to you today.   *If you need a refill on your cardiac medications before your next appointment, please call your pharmacy*  Lab Work: TODAY! CMET, TSH  You are scheduled for FASTING lab work on 05/10/2021 (the same day as your echo). If you have labs (blood work) drawn today and your tests are completely normal, you will receive your results only by: [name] MyChart Message (if you have MyChart) OR . A paper copy in the mail If you have any lab test that is abnormal or we need to change your treatment, we will call you to review the results.  Testing/Procedures: You are scheduled for an echocardiogram on 05/10/2021 at 9:30AM. Please arrive at 9:15AM for check-in. Don't forget to come fasting for  your lab work!  Follow-Up: You are scheduled for your 6 month visit on 05/12/2021 at 10:00AM.    Signed, [name], MD  11/04/2020 4:57 PM    Center

## 2020-11-04 NOTE — Patient Instructions (Signed)
Medication Instructions:  Your provider recommends that you continue on your current medications as directed. Please refer to the Current Medication list given to you today.   *If you need a refill on your cardiac medications before your next appointment, please call your pharmacy*  Lab Work: TODAY! CMET, TSH  You are scheduled for FASTING lab work on 05/10/2021 (the same day as your echo). If you have labs (blood work) drawn today and your tests are completely normal, you will receive your results only by: [name] MyChart Message (if you have MyChart) OR . A paper copy in the mail If you have any lab test that is abnormal or we need to change your treatment, we will call you to review the results.  Testing/Procedures: You are scheduled for an echocardiogram on 05/10/2021 at 9:30AM. Please arrive at 9:15AM for check-in. Don't forget to come fasting for your lab work!  Follow-Up: You are scheduled for your 6 month visit on 05/12/2021 at 10:00AM.

## 2020-11-05 LAB — COMPREHENSIVE METABOLIC PANEL
ALT: 18 IU/L (ref 0–44)
AST: 26 IU/L (ref 0–40)
Albumin/Globulin Ratio: 1.9 (ref 1.2–2.2)
Albumin: 4 g/dL (ref 3.6–4.6)
Alkaline Phosphatase: 114 IU/L (ref 44–121)
BUN/Creatinine Ratio: 16 (ref 10–24)
BUN: 15 mg/dL (ref 8–27)
Bilirubin Total: 0.5 mg/dL (ref 0.0–1.2)
CO2: 23 mmol/L (ref 20–29)
Calcium: 8.8 mg/dL (ref 8.6–10.2)
Chloride: 105 mmol/L (ref 96–106)
Creatinine, Ser: 0.92 mg/dL (ref 0.76–1.27)
GFR calc Af Amer: 90 mL/min/{1.73_m2} (ref >59)
GFR calc non Af Amer: 78 mL/min/{1.73_m2} (ref >59)
Globulin, Total: 2.1 g/dL (ref 1.5–4.5)
Glucose: 113 mg/dL — ABNORMAL HIGH (ref 65–99)
Potassium: 4.2 mmol/L (ref 3.5–5.2)
Sodium: 139 mmol/L (ref 134–144)
Total Protein: 6.1 g/dL (ref 6.0–8.5)

## 2020-11-05 LAB — TSH: TSH: 2.74 u[IU]/mL (ref 0.450–4.500)

## 2020-11-19 ENCOUNTER — Other Ambulatory Visit: Payer: Self-pay

## 2020-11-19 ENCOUNTER — Ambulatory Visit (INDEPENDENT_AMBULATORY_CARE_PROVIDER_SITE_OTHER): Payer: Medicare Other | Admitting: General Practice

## 2020-11-19 DIAGNOSIS — Z952 Presence of prosthetic heart valve: Secondary | ICD-10-CM

## 2020-11-19 DIAGNOSIS — Z7901 Long term (current) use of anticoagulants: Secondary | ICD-10-CM | POA: Diagnosis not present

## 2020-11-19 DIAGNOSIS — I482 Chronic atrial fibrillation, unspecified: Secondary | ICD-10-CM

## 2020-11-19 LAB — POCT INR: INR: 1.9 — AB (ref 2.0–3.0)

## 2020-11-19 NOTE — Patient Instructions (Addendum)
Pre visit review using our clinic review tool, if applicable. No additional management support is needed unless otherwise documented below in the visit note.  Change dosage and take 1 tablet daily except 1/2 tablet on Mon and Friday.  Re-check in 4 weeks.

## 2020-11-19 NOTE — Progress Notes (Signed)
Medical screening examination/treatment/procedure(s) were performed by non-physician practitioner and as supervising physician I was immediately available for consultation/collaboration. I agree with above. Danniell Rotundo, MD   

## 2020-12-01 ENCOUNTER — Other Ambulatory Visit: Payer: Self-pay | Admitting: Family Medicine

## 2020-12-01 DIAGNOSIS — R059 Cough, unspecified: Secondary | ICD-10-CM

## 2020-12-01 DIAGNOSIS — R062 Wheezing: Secondary | ICD-10-CM

## 2020-12-02 ENCOUNTER — Encounter (HOSPITAL_COMMUNITY): Payer: Self-pay | Admitting: Emergency Medicine

## 2020-12-02 ENCOUNTER — Emergency Department (HOSPITAL_COMMUNITY)
Admission: EM | Admit: 2020-12-02 | Discharge: 2020-12-02 | Disposition: A | Discharge status: Home / Self Care | Payer: Medicare Other | Attending: Emergency Medicine | Admitting: Emergency Medicine

## 2020-12-02 ENCOUNTER — Other Ambulatory Visit: Payer: Self-pay

## 2020-12-02 DIAGNOSIS — S4992XA Unspecified injury of left shoulder and upper arm, initial encounter: Secondary | ICD-10-CM | POA: Diagnosis present

## 2020-12-02 DIAGNOSIS — I110 Hypertensive heart disease with heart failure: Secondary | ICD-10-CM | POA: Insufficient documentation

## 2020-12-02 DIAGNOSIS — W06XXXA Fall from bed, initial encounter: Secondary | ICD-10-CM | POA: Insufficient documentation

## 2020-12-02 DIAGNOSIS — S8012XA Contusion of left lower leg, initial encounter: Secondary | ICD-10-CM | POA: Diagnosis not present

## 2020-12-02 DIAGNOSIS — K625 Hemorrhage of anus and rectum: Secondary | ICD-10-CM | POA: Insufficient documentation

## 2020-12-02 DIAGNOSIS — Z79899 Other long term (current) drug therapy: Secondary | ICD-10-CM | POA: Diagnosis not present

## 2020-12-02 DIAGNOSIS — T07XXXA Unspecified multiple injuries, initial encounter: Secondary | ICD-10-CM

## 2020-12-02 DIAGNOSIS — S40022A Contusion of left upper arm, initial encounter: Secondary | ICD-10-CM | POA: Diagnosis not present

## 2020-12-02 DIAGNOSIS — Z954 Presence of other heart-valve replacement: Secondary | ICD-10-CM | POA: Diagnosis not present

## 2020-12-02 DIAGNOSIS — I2510 Atherosclerotic heart disease of native coronary artery without angina pectoris: Secondary | ICD-10-CM | POA: Diagnosis not present

## 2020-12-02 DIAGNOSIS — I5042 Chronic combined systolic (congestive) and diastolic (congestive) heart failure: Secondary | ICD-10-CM | POA: Diagnosis not present

## 2020-12-02 DIAGNOSIS — Z7901 Long term (current) use of anticoagulants: Secondary | ICD-10-CM | POA: Insufficient documentation

## 2020-12-02 DIAGNOSIS — Z951 Presence of aortocoronary bypass graft: Secondary | ICD-10-CM | POA: Insufficient documentation

## 2020-12-02 LAB — COMPREHENSIVE METABOLIC PANEL
ALT: 27 U/L (ref 0–44)
AST: 27 U/L (ref 15–41)
Albumin: 3.8 g/dL (ref 3.5–5.0)
Alkaline Phosphatase: 103 U/L (ref 38–126)
Anion gap: 8 (ref 5–15)
BUN: 11 mg/dL (ref 8–23)
CO2: 25 mmol/L (ref 22–32)
Calcium: 9 mg/dL (ref 8.9–10.3)
Chloride: 106 mmol/L (ref 98–111)
Creatinine, Ser: 0.95 mg/dL (ref 0.61–1.24)
GFR, Estimated: >60 mL/min (ref >60)
Glucose, Bld: 107 mg/dL — ABNORMAL HIGH (ref 70–99)
Potassium: 4.1 mmol/L (ref 3.5–5.1)
Sodium: 139 mmol/L (ref 135–145)
Total Bilirubin: 1 mg/dL (ref 0.3–1.2)
Total Protein: 6.8 g/dL (ref 6.5–8.1)

## 2020-12-02 LAB — CBC
HCT: 39.3 % (ref 39.0–52.0)
Hemoglobin: 12.6 g/dL — ABNORMAL LOW (ref 13.0–17.0)
MCH: 32.5 pg (ref 26.0–34.0)
MCHC: 32.1 g/dL (ref 30.0–36.0)
MCV: 101.3 fL — ABNORMAL HIGH (ref 80.0–100.0)
Platelets: 147 10*3/uL — ABNORMAL LOW (ref 150–400)
RBC: 3.88 MIL/uL — ABNORMAL LOW (ref 4.22–5.81)
RDW: 13 % (ref 11.5–15.5)
WBC: 7.4 10*3/uL (ref 4.0–10.5)
nRBC: 0 % (ref 0.0–0.2)

## 2020-12-02 LAB — TYPE AND SCREEN
ABO/RH(D): O POS
Antibody Screen: NEGATIVE

## 2020-12-02 LAB — URINALYSIS, ROUTINE W REFLEX MICROSCOPIC
Bilirubin Urine: NEGATIVE
Glucose, UA: NEGATIVE mg/dL
Hgb urine dipstick: NEGATIVE
Ketones, ur: NEGATIVE mg/dL
Leukocytes,Ua: NEGATIVE
Nitrite: NEGATIVE
Protein, ur: NEGATIVE mg/dL
Specific Gravity, Urine: 1.006 (ref 1.005–1.030)
pH: 7 (ref 5.0–8.0)

## 2020-12-02 LAB — POC OCCULT BLOOD, ED: Fecal Occult Bld: NEGATIVE

## 2020-12-02 LAB — PROTIME-INR
INR: 2.3 — ABNORMAL HIGH (ref 0.8–1.2)
Prothrombin Time: 24.2 seconds — ABNORMAL HIGH (ref 11.4–15.2)

## 2020-12-02 NOTE — ED Triage Notes (Signed)
Pt arrives to ED with c/o rectal bleeding first noticed this morning. Pt states he had BRBPR x3 so far this morning. Denies abd or rectal pain. States fell off bed two days ago and has large brushing to left arm and leg. Pt on long term anticoagulants from Afib and TAVR. Denies SOB or CP.

## 2020-12-02 NOTE — Discharge Instructions (Addendum)
See your Physician for recheck next week.  Return if any problems.   °

## 2020-12-02 NOTE — ED Notes (Signed)
Patient verbalizes understanding of discharge instructions. Opportunity for questioning and answers were provided. Armband removed by staff, pt discharged from ED ambulatory to home.  

## 2020-12-02 NOTE — ED Provider Notes (Signed)
[place] EMERGENCY DEPARTMENT Provider Note   CSN: [id number] Arrival date & time: 12/02/20  [id number]     History Chief Complaint  Patient presents with  . Rectal Bleeding    Jesus [name] is a 79 y.o. male.  The history is provided by the patient. No language interpreter was used.  Rectal Bleeding Quality:  Bright red Amount:  Scant Duration:  1 day Timing:  Constant Chronicity:  New Similar prior episodes: no   Relieved by:  Nothing Ineffective treatments:  None tried Associated symptoms: no fever and no loss of consciousness   Risk factors: anticoagulant use    Pt reports he fell 3 days ago and hit his arm and his leg.  Pt complains of bruising.  Pt is on coumadin for atrial fibrillation      Past Medical History:  Diagnosis Date  . 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 Aortic stenosis    mild with a mean aortic valve gradient of 12 mmHg  . Atrial fibrillation (HCC)    holding sinus rhythm on Amiodarone  . CAD (coronary artery disease)    a. S/P Ant MI 1980;  b. 1997 S/P CABG x 8 (LIMA to diag-LAD, SVG to OM1-OM2-OM3, SVG to [facility] - Dr [name]);  c. 01/2015 Cath: 3VD, 8/8 patent grafts.  . Cardiomyopathy   . Dysrhythmia   . Esophageal reflux   . GERD (gastroesophageal reflux disease)   . Headache   . History of colonoscopy   . History of transesophageal echocardiography (TEE) for monitoring   . Other and unspecified hyperlipidemia   . S/P TAVR (transcatheter aortic valve replacement)    a. 03/2015 26 mm Edwards Sapien 3 transcatheter heart valve placed via open left transfemoral approach.  . Skin lesions, generalized    facial which may represent actinic keratoses and possible photosensitivity from Amiodarone  . Unspecified essential hypertension     Patient Active Problem List   Diagnosis Date Noted  . Chronic combined systolic and diastolic CHF (congestive heart failure) ([place]) 04/16/2018  . Long term  (current) use of anticoagulants 08/25/2017  . Actinic keratoses 03/21/2017  . Internal hemorrhoid, bleeding 07/01/2015  . Rectal bleeding 06/15/2015  . Internal hemorrhoids 06/15/2015  . Other constipation 06/15/2015  . Chronic anticoagulation-Couamdin 03/12/2015  . S/P TAVR (transcatheter aortic valve replacement) 03/10/2015  . Anemia 09/03/2014  . Encounter for therapeutic drug monitoring 12/31/2013  . Yellow jacket sting 08/06/2013  . Low back pain on right side with sciatica 04/24/2013  . Severe aortic stenosis 08/10/2012  . CAD (coronary artery disease) 08/10/2012  . Left ventricular dysfunction 08/10/2012  . Routine health maintenance 07/12/2012  . BEE STING REACTION, LOCAL 09/21/2010  . BEN LOC HYPERPLASIA PROS W/UR OBST & OTH LUTS 08/02/2010  . Acute and chronic cholecystitis 02/01/2010  . Abdominal pain, generalized 01/29/2010  . CORONARY ATHEROSCLEROSIS NATIVE CORONARY ARTERY 10/13/2009  . HYPERTHYROIDISM 05/22/2009  . INSOMNIA 05/22/2009  . Hyperlipidemia 04/22/2009  . 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 CAD, AUTOLOGOUS BYPASS GRAFT 10/16/2008  . Essential hypertension 09/07/2007  . Aortic valve disorder 09/07/2007  . Chronic atrial fibrillation ([place]) 09/07/2007  . GASTROESOPHAGEAL REFLUX DISEASE 09/07/2007  . 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    Past Surgical History:  Procedure Laterality Date  . CARDIAC CATHETERIZATION    . CARDIOVERSION  11/18/2006   Dr. [name]  . CATARACT EXTRACTION W/ INTRAOCULAR LENS IMPLANT  April '13  (Dr. [name])   left eye only  . CHOLECYSTECTOMY    .  CORONARY ARTERY BYPASS GRAFT  02/09/1996   LIMA to diag-LAD, SVG to OM1-OM2-OM3, SVG to [facility]  . EYE SURGERY    . LEFT AND RIGHT HEART CATHETERIZATION WITH CORONARY/GRAFT ANGIOGRAM N/A 01/26/2015   Procedure: LEFT AND RIGHT HEART CATHETERIZATION WITH Isabel Caprice;  Surgeon: Micheline Chapman, MD;  Location: [facility] CATH LAB;  Service: Cardiovascular;  Laterality: N/A;   . TEE WITHOUT CARDIOVERSION N/A 03/10/2015   Procedure: TRANSESOPHAGEAL ECHOCARDIOGRAM (TEE);  Surgeon: [name], MD;  Location: [facility] OR;  Service: Open Heart Surgery;  Laterality: N/A;  . TONSILLECTOMY    . TRANSCATHETER AORTIC VALVE REPLACEMENT, TRANSFEMORAL N/A 03/10/2015   Procedure: TRANSCATHETER AORTIC VALVE REPLACEMENT, TRANSFEMORAL;  Surgeon: [name], MD;  Location[facility] OR;  Service: Open Heart Surgery;  Laterality: N/A;       Family History  Problem Relation Age of Onset  . Stroke Mother 44       Deceased  . Crohn's disease Mother        Deceased  . Heart attack Father 46       Deceased  . Heart disease Father   . Heart failure Father        Deceased  . Atrial fibrillation Brother   . Heart disease Paternal Uncle   . Prostate cancer Paternal Uncle   . Colon cancer Neg Hx     Social History   Tobacco Use  . Smoking status: Never Smoker  . Smokeless tobacco: Never Used  . Tobacco comment: Does not smoke.  Vaping Use  . Vaping Use: Never used  Substance Use Topics  . Alcohol use: No    Alcohol/week: 0.0 standard drinks  . Drug use: No    Home Medications Prior to Admission medications   Medication Sig Start Date End Date Taking? Authorizing Provider  acetaminophen (TYLENOL) 500 MG tablet Take 500 mg by mouth every 6 (six) hours as needed for moderate pain.    [provider]  albuterol (VENTOLIN HFA) 108 (90 Base) MCG/ACT inhaler INHALE 2 PUFFS BY MOUTH EVERY 4 HOURS ASNEEDED FOR WHEEZING OR SHORTNESS OF BREATH. 12/01/20   [name], Elberta Fortis, MD  amiodarone (PACERONE) 200 MG tablet TAKE 1/2 TABLET BY MOUTH DAILY 06/12/20   [name], MD  amLODipine (NORVASC) 5 MG tablet TAKE 1 TABLET BY MOUTH ONCE DAILY 06/12/20   [name], MD  amoxicillin (AMOXIL) 400 MG/5ML suspension take 5 teaspoons one hour prior to dental procedure (dose should be amoxicillin 2 gm one hour prior to procedure) 05/22/19   [name], Elberta Fortis, MD  amoxicillin (AMOXIL)  500 MG capsule TAKE 4 CAPSULES BY MOUTH 1 HOUR PRIOR TODENTAL APPOINTMENT 10/21/20   [name], Elberta Fortis, MD  Ascorbic Acid (VITAMIN C) 1000 MG tablet Take 1,000 mg by mouth daily.      [provider]  aspirin 81 MG tablet Take 81 mg by mouth daily.      [provider]  atorvastatin (LIPITOR) 80 MG tablet TAKE 1/2 TABLET BY MOUTH DAILY 06/12/20   [name], Elberta Fortis, MD  loratadine (CLARITIN) 10 MG tablet Take 10 mg by mouth daily as needed for allergies.     [provider]  losartan (COZAAR) 100 MG tablet TAKE 1 TABLET BY MOUTH ONCE DAILY 09/08/20   [name], Elberta Fortis, MD  Multiple Vitamins-Minerals (MULTIVITAMIN,TX-MINERALS) tablet Take 1 tablet by mouth daily.      [provider]  warfarin (COUMADIN) 5 MG tablet TAKE 1/2 TABLET BY MOUTH DAILY EXCEPT 1 TABLET ON MONDAY,WEDNESDAY,AND FRIDAYS  OR TAKE AS DIRECTED BY CLINIC 09/28/20   [name], [name], MD    Allergies    Codeine and Iodine  Review of Systems   Review of Systems  Constitutional: Negative for fever.  Gastrointestinal: Positive for hematochezia.  Neurological: Negative for loss of consciousness.  All other systems reviewed and are negative.   Physical Exam Updated Vital Signs BP (!) 174/54   Pulse 69   Temp 97.8 F (36.6 C) (Oral)   Resp 17   Ht 5\' 1"  (1.549 m)   Wt 61 kg   SpO2 99%   BMI 25.41 kg/m   Physical Exam Vitals and nursing note reviewed.  Constitutional:      Appearance: He is well-developed and well-nourished.  HENT:     Head: Normocephalic and atraumatic.     Nose: Nose normal.  Eyes:     Conjunctiva/sclera: Conjunctivae normal.  Cardiovascular:     Rate and Rhythm: Normal rate and regular rhythm.     Heart sounds: No murmur heard.   Pulmonary:     Effort: Pulmonary effort is normal. No respiratory distress.     Breath sounds: Normal breath sounds.  Abdominal:     Palpations: Abdomen is soft.     Tenderness: There is no abdominal tenderness.   Genitourinary:    Rectum: Normal. Guaiac result negative.  Musculoskeletal:        General: No edema.     Cervical back: Neck supple.     Comments: Bruised left arm 20 cm area. Bruised left leg 30 cm area   Skin:    General: Skin is warm and dry.  Neurological:     General: No focal deficit present.     Mental Status: He is alert.  Psychiatric:        Mood and Affect: Mood and affect and mood normal.     ED Results / Procedures / Treatments   Labs (all labs ordered are listed, but only abnormal results are displayed) Labs Reviewed  COMPREHENSIVE METABOLIC PANEL - Abnormal; Notable for the following components:      Result Value   Glucose, Bld 107 (*)    All other components within normal limits  CBC - Abnormal; Notable for the following components:   RBC 3.88 (*)    Hemoglobin 12.6 (*)    MCV 101.3 (*)    Platelets 147 (*)    All other components within normal limits  PROTIME-INR - Abnormal; Notable for the following components:   Prothrombin Time 24.2 (*)    INR 2.3 (*)    All other components within normal limits  URINALYSIS, ROUTINE W REFLEX MICROSCOPIC  POC OCCULT BLOOD, ED  POC OCCULT BLOOD, ED  TYPE AND SCREEN    EKG None  Radiology No results found.  Procedures Procedures (including critical care time)  Medications Ordered in ED Medications - No data to display  ED Course  I have reviewed the triage vital signs and the nursing notes.  Pertinent labs & imaging results that were available during my care of the patient were reviewed by me and considered in my medical decision making (see chart for details).    MDM Rules/Calculators/A&P                          MDM: rectal exam is normal hemocult negative.  Hemoglobin is 12.6  INR is 24.2   Pt advised to follow up with his Physicain for recheck.   Final  Clinical Impression(s) / ED Diagnoses Final diagnoses:  Multiple contusions    Rx / DC Orders ED Discharge Orders    None    An After Visit  Summary was printed and given to the patient.   Fransico Meadow, [place] 12/02/20 1825    Tegeler, Gwenyth Allegra, MD 12/02/20 [phone]

## 2020-12-07 ENCOUNTER — Other Ambulatory Visit: Payer: Self-pay | Admitting: Family Medicine

## 2020-12-09 ENCOUNTER — Ambulatory Visit (INDEPENDENT_AMBULATORY_CARE_PROVIDER_SITE_OTHER): Payer: Medicare Other

## 2020-12-09 ENCOUNTER — Ambulatory Visit (INDEPENDENT_AMBULATORY_CARE_PROVIDER_SITE_OTHER): Payer: Medicare Other | Admitting: Family Medicine

## 2020-12-09 ENCOUNTER — Other Ambulatory Visit: Payer: Self-pay

## 2020-12-09 ENCOUNTER — Encounter: Payer: Self-pay | Admitting: Family Medicine

## 2020-12-09 VITALS — BP 112/60 | HR 64 | Ht 61.0 in | Wt 135.0 lb

## 2020-12-09 DIAGNOSIS — M1612 Unilateral primary osteoarthritis, left hip: Secondary | ICD-10-CM | POA: Diagnosis not present

## 2020-12-09 DIAGNOSIS — Z7901 Long term (current) use of anticoagulants: Secondary | ICD-10-CM

## 2020-12-09 DIAGNOSIS — K625 Hemorrhage of anus and rectum: Secondary | ICD-10-CM

## 2020-12-09 DIAGNOSIS — S7002XD Contusion of left hip, subsequent encounter: Secondary | ICD-10-CM

## 2020-12-09 DIAGNOSIS — S7002XA Contusion of left hip, initial encounter: Secondary | ICD-10-CM | POA: Diagnosis not present

## 2020-12-09 DIAGNOSIS — I739 Peripheral vascular disease, unspecified: Secondary | ICD-10-CM | POA: Diagnosis not present

## 2020-12-09 DIAGNOSIS — M47816 Spondylosis without myelopathy or radiculopathy, lumbar region: Secondary | ICD-10-CM | POA: Diagnosis not present

## 2020-12-09 NOTE — Patient Instructions (Signed)
Rectal Bleeding  Rectal bleeding is when blood passes out of the anus. People with rectal bleeding may notice bright red blood in their underwear or in the toilet after having a bowel movement. They may also have dark red or black stools. Rectal bleeding is usually a sign that something is wrong. Many things can cause rectal bleeding, including:  Hemorrhoids. These are blood vessels in the anus or rectum that are larger than normal.  Fistulas. These are abnormal passages in the rectum and anus.  Anal fissures. This is a tear in the anus.  Diverticulosis. This is a condition in which pockets or sacs project from the bowel.  Proctitis and colitis. These are conditions in which the rectum, colon, or anus become inflamed.  Polyps. These are growths that can be cancerous (malignant) or non-cancerous (benign).  Part of the rectum sticking out from the anus (rectal prolapse).  Certain medicines.  Intestinal infections. Follow these instructions at home: Pay attention to any changes in your symptoms. Take these actions to help lessen bleeding and discomfort:  Eat a diet that is high in fiber. This will keep your stool soft, making it easier to pass stools without straining. Ask your health care provider what foods and drinks are high in fiber.  Drink enough fluid to keep your urine clear or pale yellow. This also helps to keep your stool soft.  Try taking a warm bath. This may help soothe any pain in your rectum.  Keep all follow-up visits as told by your health care provider. This is important. Get help right away if:  You have new or increased rectal bleeding.  You have black or dark red stools.  You vomit blood or something that looks like coffee grounds.  You have pain or tenderness in your abdomen.  You have a fever.  You feel weak.  You feel nauseous.  You faint.  You have severe pain in your rectum.  You cannot have a bowel movement. This information is not  intended to replace advice given to you by your health care provider. Make sure you discuss any questions you have with your health care provider. Document Revised: 07/14/2016 Document Reviewed: 01/17/2016 Elsevier Patient Education  2020 Elsevier Inc.  

## 2020-12-09 NOTE — Progress Notes (Signed)
Established Patient Office Visit  Subjective:  Patient ID: Jesus [name], male    DOB: [birth year]  Age: 80 y.o. MRN: [id number]  CC:  Chief Complaint  Patient presents with  . Hip Pain    HPI Jesus [name] presents for follow-up from recent ER visit.  He states he was on the edge of his bed and basically fell off the bed onto a stool striking his left hip.  He had some fairly extensive bruising involving the arm and leg.  He was ambulating without difficulty.  He went to the ER on 12/02/2020.  He also had reported some bright red blood per rectum and had evaluation there.  Hemoglobin stable at 12.6.  Platelets normal.  INR 2.3.  He had digital rectal exam and Hemoccult was negative.  Last colonoscopy 3/17 revealed one adenoma and 1 hyperplastic polyp.  No recent constipation or straining.  No recurrent blood per rectum.  No recent appetite or weight changes.  Regarding his injuries from fall he does have extensive bruising left lower extremity but bearing weight without difficulty.  Does have some lateral soreness.  No x-rays were done in the ER.  Denies any major upper extremity pain.  No head injury.  No headaches.  No dizziness.  No recent syncopal episodes.  Past Medical History:  Diagnosis Date  . Acute myocardial infarction of inferior wall (HCC) 1980  . Aortic stenosis    mild with a mean aortic valve gradient of 12 mmHg  . Atrial fibrillation ([facility])    holding sinus rhythm on Amiodarone  . CAD (coronary artery disease)    a. S/P Ant MI 1980;  b. 1997 S/P CABG x 8 (LIMA to diag-LAD, SVG to OM1-OM2-OM3, SVG to [facility] - Dr Andrey Campanile);  c. 01/2015 Cath: 3VD, 8/8 patent grafts.  . Cardiomyopathy   . Dysrhythmia   . Esophageal reflux   . GERD (gastroesophageal reflux disease)   . Headache   . History of colonoscopy   . History of transesophageal echocardiography (TEE) for monitoring   . Other and unspecified hyperlipidemia   . S/P TAVR (transcatheter aortic valve  replacement)    a. 03/2015 26 mm Edwards Sapien 3 transcatheter heart valve placed via open left transfemoral approach.  . Skin lesions, generalized    facial which may represent actinic keratoses and possible photosensitivity from Amiodarone  . Unspecified essential hypertension     Past Surgical History:  Procedure Laterality Date  . CARDIAC CATHETERIZATION    . CARDIOVERSION  11/18/2006   Dr. Jacklynn Bue  . CATARACT EXTRACTION W/ INTRAOCULAR LENS IMPLANT  April '13  (Dr. Dagoberto Ligas)   left eye only  . CHOLECYSTECTOMY    . CORONARY ARTERY BYPASS GRAFT  02/09/1996   LIMA to diag-LAD, SVG to OM1-OM2-OM3, SVG to [facility]  . EYE SURGERY    . LEFT AND RIGHT HEART CATHETERIZATION WITH CORONARY/GRAFT ANGIOGRAM N/A 01/26/2015   Procedure: LEFT AND RIGHT HEART CATHETERIZATION WITH Isabel Caprice;  Surgeon: Micheline Chapman, MD;  Location: [facility] CATH LAB;  Service: Cardiovascular;  Laterality: N/A;  . TEE WITHOUT CARDIOVERSION N/A 03/10/2015   Procedure: TRANSESOPHAGEAL ECHOCARDIOGRAM (TEE);  Surgeon: [name], MD;  Location: [facility] OR;  Service: Open Heart Surgery;  Laterality: N/A;  . TONSILLECTOMY    . TRANSCATHETER AORTIC VALVE REPLACEMENT, TRANSFEMORAL N/A 03/10/2015   Procedure: TRANSCATHETER AORTIC VALVE REPLACEMENT, TRANSFEMORAL;  Surgeon: [name], MD;  Location: [facility] OR;  Service: Open Heart Surgery;  Laterality: N/A;    Family History  Problem Relation Age of Onset  . Stroke Mother 36       Deceased  . Crohn's disease Mother        Deceased  . Heart attack Father 59       Deceased  . Heart disease Father   . Heart failure Father        Deceased  . Atrial fibrillation Brother   . Heart disease Paternal Uncle   . Prostate cancer Paternal Uncle   . Colon cancer Neg Hx     Social History   Socioeconomic History  . Marital status: Married    Spouse name: Not on file  . Number of children: Not on file  . Years of education: Not on file  . Highest education level:  Not on file  Occupational History  . Not on file  Tobacco Use  . Smoking status: Never Smoker  . Smokeless tobacco: Never Used  . Tobacco comment: Does not smoke.  Vaping Use  . Vaping Use: Never used  Substance and Sexual Activity  . Alcohol use: No    Alcohol/week: 0.0 standard drinks  . Drug use: No  . Sexual activity: Not Currently  Other Topics Concern  . Not on file  Social History Narrative   HSG. [employer] - [employer] 6 years. Married - '69 - 1 year/divorced. '76 - . No children. Work - mfg/textiles - Audiological scientist; currently works doing maintenance. [employer] - they have discussed this. Provided packet august '13.                Social Determinants of Health   Financial Resource Strain: Not on file  Food Insecurity: Not on file  Transportation Needs: Not on file  Physical Activity: Not on file  Stress: Not on file  Social Connections: Not on file  Intimate Partner Violence: Not on file    Outpatient Medications Prior to Visit  Medication Sig Dispense Refill  . acetaminophen (TYLENOL) 500 MG tablet Take 500 mg by mouth every 6 (six) hours as needed for moderate pain.    [name] albuterol (VENTOLIN HFA) 108 (90 Base) MCG/ACT inhaler INHALE 2 PUFFS BY MOUTH EVERY 4 HOURS ASNEEDED FOR WHEEZING OR SHORTNESS OF BREATH. 6.7 g 1  . amiodarone (PACERONE) 200 MG tablet TAKE 1/2 TABLET BY MOUTH DAILY 45 tablet 3  . amLODipine (NORVASC) 5 MG tablet TAKE 1 TABLET BY MOUTH ONCE DAILY 90 tablet 3  . amoxicillin (AMOXIL) 400 MG/5ML suspension take 5 teaspoons one hour prior to dental procedure (dose should be amoxicillin 2 gm one hour prior to procedure) 100 mL 0  . amoxicillin (AMOXIL) 500 MG capsule TAKE 4 CAPSULES BY MOUTH 1 HOUR PRIOR TODENTAL APPOINTMENT 8 capsule 0  . Ascorbic Acid (VITAMIN C) 1000 MG tablet Take 1,000 mg by mouth daily.    [name] aspirin 81 MG tablet Take 81 mg by mouth daily.    [name] atorvastatin (LIPITOR) 80 MG tablet TAKE 1/2 TABLET BY MOUTH DAILY 90 tablet 1  .  loratadine (CLARITIN) 10 MG tablet Take 10 mg by mouth daily as needed for allergies.    [name] losartan (COZAAR) 100 MG tablet TAKE 1 TABLET BY MOUTH ONCE DAILY 90 tablet 0  . Multiple Vitamins-Minerals (MULTIVITAMIN,TX-MINERALS) tablet Take 1 tablet by mouth daily.    [name] warfarin (COUMADIN) 5 MG tablet TAKE 1/2 TABLET BY MOUTH DAILY EXCEPT 1 TABLET ON MONDAY,WEDNESDAY,AND FRIDAYS OR TAKE AS DIRECTED BY CLINIC 90 tablet 1   No facility-administered medications prior to visit.  Allergies  Allergen Reactions  . Codeine Other (See Comments)    Pt does not remember  . Iodine Swelling    ROS Review of Systems  Constitutional: Negative for fatigue.  Eyes: Negative for visual disturbance.  Respiratory: Negative for cough, chest tightness and shortness of breath.   Cardiovascular: Negative for chest pain, palpitations and leg swelling.  Gastrointestinal: Positive for anal bleeding. Negative for abdominal pain.  Genitourinary: Negative for dysuria.  Neurological: Negative for dizziness, syncope, weakness, light-headedness and headaches.      Objective:    Physical Exam Vitals reviewed.  Cardiovascular:     Rate and Rhythm: Normal rate.  Pulmonary:     Effort: Pulmonary effort is normal.     Breath sounds: Normal breath sounds.  Musculoskeletal:     Comments: He has good range of motion left hip.  He does have extensive bruising from the left lateral hip region all the way down to the back of the knee area.  He does have some tenderness of the greater trochanteric bursa region and has what appear to be a couple of hematomas left lateral hip region.  These are minimally tender to palpation.  Neurological:     Mental Status: He is alert.     BP 112/60   Pulse 64   Ht 5\' 1"  (1.549 m)   Wt 135 lb (61.2 kg)   SpO2 98%   BMI 25.51 kg/m  Wt Readings from Last 3 Encounters:  12/09/20 135 lb (61.2 kg)  12/02/20 134 lb 7.7 oz (61 kg)  11/04/20 134 lb (60.8 kg)     Health Maintenance  Due  Topic Date Due  . COVID-19 Vaccine (3 - Inadvertent risk 4-dose series) 02/17/2020    There are no preventive care reminders to display for this patient.  Lab Results  Component Value Date   TSH 2.740 11/04/2020   Lab Results  Component Value Date   WBC 7.4 12/02/2020   HGB 12.6 (L) 12/02/2020   HCT 39.3 12/02/2020   MCV 101.3 (H) 12/02/2020   PLT 147 (L) 12/02/2020   Lab Results  Component Value Date   NA 139 12/02/2020   K 4.1 12/02/2020   CO2 25 12/02/2020   GLUCOSE 107 (H) 12/02/2020   BUN 11 12/02/2020   CREATININE 0.95 12/02/2020   BILITOT 1.0 12/02/2020   ALKPHOS 103 12/02/2020   AST 27 12/02/2020   ALT 27 12/02/2020   PROT 6.8 12/02/2020   ALBUMIN 3.8 12/02/2020   CALCIUM 9.0 12/02/2020   ANIONGAP 8 12/02/2020   GFR 104.41 01/21/2015   Lab Results  Component Value Date   CHOL 147 05/01/2019   Lab Results  Component Value Date   HDL 54 05/01/2019   Lab Results  Component Value Date   LDLCALC 73 05/01/2019   Lab Results  Component Value Date   TRIG 102 05/01/2019   Lab Results  Component Value Date   CHOLHDL 2.7 05/01/2019   Lab Results  Component Value Date   HGBA1C 6.0 (H) 03/06/2015      Assessment & Plan:   #1 recent fall with hematoma left lateral hip with extensive bruising.  Fortunately, ambulating without difficulty and good range of motion with no pain with internal or external rotation  -Obtain x-rays left hip to further assess.  If normal continue usual activities  #2 history of recent episode of bright red blood per rectum.  Last colonoscopy 2017.  Recent INR and CBC are reassuring.  He has not  had any recurrent bleeding.  -Recommend observation for now. -If he has further occurrences consider GI referral for further evaluation.  No orders of the defined types were placed in this encounter.   Follow-up: No follow-ups on file.    [name], MD

## 2020-12-10 ENCOUNTER — Telehealth: Payer: Self-pay

## 2020-12-10 NOTE — Telephone Encounter (Signed)
Informed patient of results and recommendations. 

## 2020-12-10 NOTE — Telephone Encounter (Signed)
-----   Message from Kristian Covey, MD sent at 12/10/2020  1:34 PM EST ----- Hip x-ray no acute abnormality.   Does have diffuse osteopenia.   Will discuss at follow up possible DEXA scan.

## 2020-12-15 ENCOUNTER — Telehealth: Payer: Self-pay

## 2020-12-15 ENCOUNTER — Ambulatory Visit: Payer: Medicare Other | Admitting: Pharmacist

## 2020-12-15 DIAGNOSIS — E785 Hyperlipidemia, unspecified: Secondary | ICD-10-CM

## 2020-12-15 DIAGNOSIS — I10 Essential (primary) hypertension: Secondary | ICD-10-CM

## 2020-12-15 NOTE — Telephone Encounter (Signed)
-----   Message from [name], [facility] sent at 12/15/2020  2:33 PM EST ----- Regarding: CCM referral Hi,  Could you possibly place a CCM referral for [name]? He is one of Dr. Erick Blinks patients.   Thank you, Maddie

## 2020-12-15 NOTE — Chronic Care Management (AMB) (Signed)
Chronic Care Management Pharmacy  Name: Jesus [name]  MRN: [id number] DOB: 1941-02-21  Initial Questions: 1. Have you seen any other providers since your last visit? n/a 2. Any changes in your medicines or health? No   Chief Complaint/ HPI  Jesus [name],  80 y.o. , male presents for their Follow-Up CCM visit with the clinical pharmacist via telephone due to COVID-19 Pandemic.  PCP : [name], MD  Their chronic conditions include: HTN, Afib, HLD, CHF, GERD, pain, dental prophylaxis, allergic rhinitis  Office Visits: 12/09/20 [name], MD: Patient presented for post ED visit and left hip pain. X-ray showed osteopenia.  -11/09/20 Meriam Sprague, RN: Patient presented for INR check. INR 1.9, goal 2-3. Increased warfarin dose to 2.5 mg every Mon, Fri and 5 mg on all other days. Recheck in 4 weeks.  -08/03/20 [name], MD: Patient presented with left ankle pain. Patient first noted 8/27. Patient still works about 25 hrs/weel and goes to the gym prior to work. Recommended icing 20 minutes 3 times daily and trial of OTC diclofenac gel. Follow up in a few weeks if not resolved.   -01/29/20 [name], MD: Patient presented for medicare annual wellness visit. No medication changes made. Discussed fall prevention.  Consult Visit: -12/02/20 Patient presented to the ED for rectal bleeding and multiple contusions.  11/04/20 [name], MD (cardiology): Patient presented for afib, CAD and aortic valve disorder follow up.  -05/01/20 [name], MD (cardiology): Patient presented for afib, CAD and aortic valve disorder follow up. CBC stable and TSH and CMP WNL. BP elevated in office. Echo was reviewed and heart valve function was unchanged. Follow up in 6 months.  -02/27/20 Shon Hough (ophthalmology): Patient presented for follow up for ocular hypertension. Unable to access notes.  Medications: Outpatient Encounter Medications as of 12/15/2020  Medication  Sig Note  . acetaminophen (TYLENOL) 500 MG tablet Take 500 mg by mouth every 6 (six) hours as needed for moderate pain.   [name] albuterol (VENTOLIN HFA) 108 (90 Base) MCG/ACT inhaler INHALE 2 PUFFS BY MOUTH EVERY 4 HOURS ASNEEDED FOR WHEEZING OR SHORTNESS OF BREATH.   [name] amiodarone (PACERONE) 200 MG tablet TAKE 1/2 TABLET BY MOUTH DAILY   . amLODipine (NORVASC) 5 MG tablet TAKE 1 TABLET BY MOUTH ONCE DAILY   . amoxicillin (AMOXIL) 400 MG/5ML suspension take 5 teaspoons one hour prior to dental procedure (dose should be amoxicillin 2 gm one hour prior to procedure)   . amoxicillin (AMOXIL) 500 MG capsule TAKE 4 CAPSULES BY MOUTH 1 HOUR PRIOR TODENTAL APPOINTMENT   . Ascorbic Acid (VITAMIN C) 1000 MG tablet Take 1,000 mg by mouth daily.   [name] aspirin 81 MG tablet Take 81 mg by mouth daily.   [name] atorvastatin (LIPITOR) 80 MG tablet TAKE 1/2 TABLET BY MOUTH DAILY   . loratadine (CLARITIN) 10 MG tablet Take 10 mg by mouth daily as needed for allergies.   [name] losartan (COZAAR) 100 MG tablet TAKE 1 TABLET BY MOUTH ONCE DAILY   . Multiple Vitamins-Minerals (MULTIVITAMIN,TX-MINERALS) tablet Take 1 tablet by mouth daily.   . [DISCONTINUED] warfarin (COUMADIN) 5 MG tablet TAKE 1/2 TABLET BY MOUTH DAILY EXCEPT 1 TABLET ON MONDAY,WEDNESDAY,AND FRIDAYS OR TAKE AS DIRECTED BY CLINIC 12/17/2020: Patient will use the 4 mg tablets   No facility-administered encounter medications on file as of 12/15/2020.    Current Diagnosis/Assessment:  Goals Addressed            This Visit's Progress   .  Pharmacy Care Plan       CARE PLAN ENTRY (see longitudinal plan of care for additional care plan information)  Current Barriers:  . Chronic Disease Management support, education, and care coordination needs related to Hypertension, Hyperlipidemia, Atrial Fibrillation, Heart Failure, and GERD   Hypertension BP Readings from Last 3 Encounters:  12/09/20 112/60  12/02/20 (!) 152/56  11/04/20 132/60   . Pharmacist Clinical  Goal(s): o Over the next 120 days, patient will work with PharmD and providers to achieve BP goal <130/80 . Current regimen:  . Amlodipine 5 mg 1 tablet daily  . Losartan 100 mg 1 tablet daily . Interventions: o We discussed the importance of checking blood pressure regularly to make sure medications are working properly. o We discussed looking at nutrition labels for sodium content and avoiding adding salt to food or using a salt substitute . Patient self care activities - Over the next 120 days, patient will: o Check blood pressure weekly, document, and provide at future appointments o Ensure daily salt intake < 2300 mg/day  Hyperlipidemia Lab Results  Component Value Date/Time   LDLCALC 73 05/01/2019 09:03 AM   . Pharmacist Clinical Goal(s): o Over the next 120 days, patient will work with PharmD and providers to achieve LDL goal < 70 . Current regimen:  o Atorvastatin 80 mg 1/2 tablet daily . Interventions: o Discussed lowering cholesterol through diet by: [name] Limiting foods with cholesterol such as liver and other organ meats, egg yolks, shrimp, and whole milk dairy products . Avoiding saturated fats and trans fats and incorporating healthier fats, such as lean meat, nuts, and unsaturated oils like canola and olive oils . Eating foods with soluble fiber such as whole-grain cereals such as oatmeal and oat bran, fruits such as apples, bananas, oranges, pears, and prunes, legumes such as kidney beans, lentils, chick peas, black-eyed peas, and lima beans, and green leafy vegetables . Limiting alcohol intake . Patient self care activities - Over the next 120 days, patient will: o Continue current medications  Afib . Pharmacist Clinical Goal(s): o Over the next 120 days, patient will work with PharmD and providers to maintain heart rate < 110 beats per minute . Current regimen:  . Amiodarone 200 mg 1/2 tablet daily . Warfarin 2.5 mg every Mon, Wed, Fri and 5 mg on all other days   . Interventions: o Discussed monitoring for unexpected bruising and bleeding such as nosebleeds or blood in urine or stool . Patient self care activities - Over the next 120 days, patient will: o Check heart rate weekly, document, and provide at future appointments o Contact provider with any episodes of bleeding  Heart failure . Pharmacist Clinical Goal(s) o Over the next 120 days, patient will work with PharmD and providers to manage fluid . Current regimen:  . Losartan 100 mg 1 tablet daily . Amiodarone 200 mg 1/2 tablet daily . Interventions: o Discussed the importance of maintaining a low sodium diet in order  to avoid fluid build up . Patient self care activities - Over the next 120 days, patient will: o Continue current medications  GERD . Pharmacist Clinical Goal(s) o Over the next 120 days, patient will work with PharmD and providers to manage symptoms of heartburn . Current regimen:  o Pepcid or Zantac as needed . Interventions: o Discussed non-pharmacologic management of symptoms such as elevating the head of your bed, avoiding eating 2-3 hours before bed, avoiding triggering foods such as acidic, spicy, or fatty foods, eating  smaller meals, and wearing clothes that are loose around the waist . Patient self care activities - Over the next 120 days, patient will: o Continue current medications  Medication management . Pharmacist Clinical Goal(s): o Over the next 120 days, patient will work with PharmD and providers to maintain optimal medication adherence . Current pharmacy: [facility] . Interventions o Comprehensive medication review performed. o Continue current medication management strategy . Patient self care activities - Over the next 120 days, patient will: o Take medications as prescribed o Report any questions or concerns to PharmD and/or provider(s)  Please see past updates related to this goal by clicking on the "Past Updates" button in the selected  goal         Hypertension   BP goal is:  <130/80  Office blood pressures are  BP Readings from Last 3 Encounters:  12/09/20 112/60  12/02/20 (!) 152/56  11/04/20 132/60   Patient checks BP at home when feeling symptomatic Patient home BP readings are ranging: n/a   Patient has failed these meds in the past:  Patient is currently uncontrolled on the following medications:  . Amlodipine 5 mg 1 tablet daily - in AM  . Losartan 100 mg 1 tablet daily - in AM  We discussed diet and exercise extensively and the importance of checking blood pressure at home  -Exercise  Plan Plan to check once a week at home. BP assessment in 1-2 months. Continue current medications     AFIB   Patient is currently rate controlled. Office heart rates are  Pulse Readings from Last 3 Encounters:  12/09/20 64  12/02/20 70  11/04/20 61    CHA2DS2-VASc Score = 54  The patient's score is based upon: age > 62, HTN, CHF  Patient has failed these meds in past: none Patient is currently controlled on the following medications:  . Amiodarone 200 mg 1/2 tablet daily . Warfarin 2.5 mg every Mon, Fri and 5 mg on all other days - at dinner  We discussed:  monitoring for unexpected bruising and bleeding (nosebleeds, blood in urine or stool)  Plan  Continue current medications  Hyperlipidemia   LDL goal < 70  Lipid Panel     Component Value Date/Time   CHOL 147 05/01/2019 0903   TRIG 102 05/01/2019 0903   TRIG 67 11/20/2006 0748   HDL 54 05/01/2019 0903   LDLCALC 73 05/01/2019 0903    Hepatic Function Latest Ref Rng & Units 12/02/2020 11/04/2020 05/01/2020  Total Protein 6.5 - 8.1 g/dL 6.8 6.1 6.3  Albumin 3.5 - 5.0 g/dL 3.8 4.0 3.9  AST 15 - 41 U/L _0 ALT 0 - 44 U/L _1 Alk Phosphatase 38 - 126 U/L 103 114 107  Total Bilirubin 0.3 - 1.2 mg/dL 1.0 0.5 0.5  Bilirubin, Direct 0.00 - 0.40 mg/dL - - -     The ASCVD Risk score Mikey Bussing DC Jr., et al., 2013) failed to calculate  for the following reasons:   The 2013 ASCVD risk score is only valid for ages 43 to 53   Patient has failed these meds in past: unknown Patient is currently uncontrolled on the following medications:  . Atorvastatin 80 mg 1/2 tablet daily  We discussed:  diet and exercise extensively  -Exercise  Plan Recommend repeat lipid panel. May need to increase statin to lower LDL to goal < 70. Continue current medications  CAD   Patient has failed these meds in past:  none Patient is currently controlled on the following medications:  . Aspirin 81 mg 1 tablet daily . Atorvastatin 80 mg 1/2 tablet daily  Plan  Continue current medications    Heart Failure   Type: Left Ventricular Failure  Last ejection fraction: 40-45% (05/01/20) NYHA Class: II (slight limitation of activity) AHA HF Stage: B (Heart disease present - no symptoms present)  BP goal is:  <130/80 BP Readings from Last 3 Encounters:  12/09/20 112/60  12/02/20 (!) 152/56  11/04/20 132/60   Kidney Function Lab Results  Component Value Date/Time   CREATININE 0.95 12/02/2020 10:23 AM   CREATININE 0.92 11/04/2020 03:11 PM   CREATININE 0.91 09/12/2016 01:11 PM   CREATININE 0.88 03/11/2016 07:34 AM   GFR 104.41 01/21/2015 08:27 AM   GFRNONAA >60 12/02/2020 10:23 AM   GFRAA 90 11/04/2020 03:11 PM   K 4.1 12/02/2020 10:23 AM   K 4.2 11/04/2020 03:11 PM   Patient is currently controlled on the following medications:  . Losartan 100 mg 1 tablet daily . Amiodarone 200 mg 1/2 tablet daily  We discussed monitoring for fluid build up  Plan  Continue current medications   GERD   Patient is currently controlled on the following medications:  . Tums PRN . Pepcid 20 mg PRN . Zantac 10 mg PRN  We discussed: Non-pharmacologic management of symptoms such as elevating the head of your bed, avoiding eating 2-3 hours before bed, avoiding triggering foods such as acidic, spicy, or fatty foods, eating smaller meals, and  wearing clothes that are loose around the waist -Trigger: tomatoes   Plan  Continue current medications    Allergic rhinitis   Patient has failed these meds in past: none Patient is currently controlled on the following medications:  . Claritin 10 mg 1 tablet daily PRN - patient takes about 3 x month  We discussed:  Avoiding antihistamines such as Benadryl due to risks of falls in older adults  Plan  Continue current medications  Pain   Patient is currently controlled on the following medications:  . Tylenol 500 mg daily (patient takes 2 x month)  We discussed:   Avoiding NSAIDs for pain while taking anticoagulants; maximum daily dose of Tylenol of 3,000 mg/day  Plan  Continue current medications   Dental prophylaxis   Patient is currently on the following medications:  . Amoxicillin 500 mg 4 capsules . Amoxicillin suspension   Plan  Continue current medications   Vitamins/supplements  Patient is currently on the following medications:  [name] Multivitamin 1 tablet daily . Vitamin C 1000 mg 1 tablet daily   Plan  Continue current medications  Vaccines   Reviewed and discussed patient's vaccination history.    Immunization History  Administered Date(s) Administered  . Fluad Quad(high Dose 65+) 10/04/2019, 10/08/2020  . Influenza Whole 09/05/2001, 09/26/2008, 09/26/2009, 09/14/2010  . Influenza, High Dose Seasonal PF 08/12/2015, 09/02/2016, 08/25/2017, 09/07/2018  . Influenza,inj,Quad PF,6+ Mos 08/27/2014  . Influenza-Unspecified 09/12/2012, 08/16/2013  . PFIZER(Purple Top)SARS-COV-2 Vaccination 01/06/2020, 01/20/2020  . Pneumococcal Conjugate-13 04/07/2014  . Pneumococcal Polysaccharide-23 09/05/2001, 01/14/2010  . Td 01/06/1999  . Tetanus 05/15/2013  . Zoster 12/14/2011  . Zoster Recombinat (Shingrix) 09/06/2019, 02/19/2020   Confirmed second dose of Shingrix with [facility] and updated immunization history.   Patient reported he received his [place] booster on 11/23/20 at [facility] but unable to locate record.  Plan  Recommended patient receive COVID booster vaccine at pharmacy.  Medication Management   Pt uses [facility]  pharmacy for all medications Uses pill box? Yes - 1 week at a time; morning, lunch, and supper (3 different pill boxes) Pt endorses 95% compliance - sometimes misses nighttime medicine (aspirin and vitamin C)  We discussed: Current pharmacy is preferred with insurance plan and patient is satisfied with pharmacy services  Plan  Continue current medication management strategy  Follow up: 4 month phone visit CPA 2 month BP assessment  [name], PharmD Clinical Pharmacist [place]

## 2020-12-17 ENCOUNTER — Ambulatory Visit (INDEPENDENT_AMBULATORY_CARE_PROVIDER_SITE_OTHER): Payer: Medicare Other | Admitting: General Practice

## 2020-12-17 ENCOUNTER — Other Ambulatory Visit: Payer: Self-pay | Admitting: General Practice

## 2020-12-17 ENCOUNTER — Other Ambulatory Visit: Payer: Self-pay

## 2020-12-17 DIAGNOSIS — Z7901 Long term (current) use of anticoagulants: Secondary | ICD-10-CM | POA: Diagnosis not present

## 2020-12-17 DIAGNOSIS — I482 Chronic atrial fibrillation, unspecified: Secondary | ICD-10-CM

## 2020-12-17 DIAGNOSIS — Z952 Presence of prosthetic heart valve: Secondary | ICD-10-CM

## 2020-12-17 LAB — POCT INR: INR: 3.6 — AB (ref 2.0–3.0)

## 2020-12-17 MED ORDER — WARFARIN SODIUM 4 MG PO TABS: ORAL_TABLET | ORAL | 0 refills | Status: DC

## 2020-12-17 MED ORDER — WARFARIN SODIUM 1 MG PO TABS: ORAL_TABLET | ORAL | 0 refills | Status: DC

## 2020-12-17 NOTE — Patient Instructions (Addendum)
Pre visit review using our clinic review tool, if applicable. No additional management support is needed unless otherwise documented below in the visit note.  Hold dosage tomorrow and then change dosage and take 1 (4 mg) tablet daily.  Re-check in 2 weeks.

## 2020-12-17 NOTE — Progress Notes (Signed)
Medical screening examination/treatment/procedure(s) were performed by non-physician practitioner and as supervising physician I was immediately available for consultation/collaboration. I agree with above. Ayanni Tun, MD   

## 2020-12-31 ENCOUNTER — Other Ambulatory Visit: Payer: Self-pay

## 2020-12-31 ENCOUNTER — Ambulatory Visit (INDEPENDENT_AMBULATORY_CARE_PROVIDER_SITE_OTHER): Payer: Medicare Other | Admitting: Family Medicine

## 2020-12-31 ENCOUNTER — Encounter: Payer: Self-pay | Admitting: Family Medicine

## 2020-12-31 ENCOUNTER — Ambulatory Visit (INDEPENDENT_AMBULATORY_CARE_PROVIDER_SITE_OTHER): Payer: Medicare Other | Admitting: General Practice

## 2020-12-31 VITALS — BP 130/60 | HR 59 | Temp 98.4°F | Wt 135.0 lb

## 2020-12-31 DIAGNOSIS — S60551A Superficial foreign body of right hand, initial encounter: Secondary | ICD-10-CM | POA: Diagnosis not present

## 2020-12-31 DIAGNOSIS — I482 Chronic atrial fibrillation, unspecified: Secondary | ICD-10-CM

## 2020-12-31 DIAGNOSIS — Z7901 Long term (current) use of anticoagulants: Secondary | ICD-10-CM

## 2020-12-31 DIAGNOSIS — Z952 Presence of prosthetic heart valve: Secondary | ICD-10-CM

## 2020-12-31 LAB — POCT INR: INR: 1.8 — AB (ref 2.0–3.0)

## 2020-12-31 NOTE — Patient Instructions (Addendum)
Pre visit review using our clinic review tool, if applicable. No additional management support is needed unless otherwise documented below in the visit note.  Take 1 1/2 tablets today and then change dosage and take 1 (4 mg) tablet daily except take 1 1/2 tablets (6 mg) every Thursday.  Re-check in 4 weeks.

## 2020-12-31 NOTE — Progress Notes (Signed)
I have reviewed and agree.

## 2020-12-31 NOTE — Progress Notes (Signed)
Subjective:    Patient ID: Jesus [name], male    DOB: [birth year], 80 y.o.   MRN: [id number]  Chief Complaint  Patient presents with   Foreign Body in Skin    HPI Patient is an 80 yo male with pmh sig for h/o MI, CAD, aortic stenosis, Afib, TAVR on chronic anticoagulation, Cardiomyopathy, GERD, was seen today for splinter in hand.  Pt notes wound in R palm after picking up some wood on Monday.  Pt tried to get out the splinter but was unable to do so.  Denies fever, chills, pain in hand, erythema, edema.  Pt's last tetanus shot was 2014 per chart review.  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 S/P Ant MI 1980;  b. 1997 S/P CABG x 8 (LIMA to diag-LAD, SVG to OM1-OM2-OM3, SVG to [facility] - Dr [name]);  c. 01/2015 Cath: 3VD, 8/8 patent grafts.   Cardiomyopathy    Dysrhythmia    Esophageal reflux    GERD (gastroesophageal reflux disease)    Headache    History of colonoscopy    History of transesophageal echocardiography (TEE) for monitoring    Other and unspecified hyperlipidemia    S/P TAVR (transcatheter aortic valve replacement)    a. 03/2015 26 mm Edwards Sapien 3 transcatheter heart valve placed via open left transfemoral approach.   Skin lesions, generalized    facial which may represent actinic keratoses and possible photosensitivity from Amiodarone   Unspecified essential hypertension     Allergies  Allergen Reactions   Codeine Other (See Comments)    Pt does not remember   Iodine Swelling    ROS General: Denies fever, chills, night sweats, changes in weight, changes in appetite HEENT: Denies headaches, ear pain, changes in vision, rhinorrhea, sore throat CV: Denies CP, palpitations, SOB, orthopnea Pulm: Denies SOB,  cough, wheezing GI: Denies abdominal pain, nausea, vomiting, diarrhea, constipation GU: Denies dysuria, hematuria, frequency Msk: Denies muscle cramps, joint pains Neuro: Denies weakness, numbness, tingling Skin: Denies rashes, bruising  +splinter in R palm Psych: Denies depression, anxiety, hallucinations      Objective:    Blood pressure 130/60, pulse (!) 59, temperature 98.4 F (36.9 C), temperature source Oral, weight 135 lb (61.2 kg), SpO2 97 %.  Gen. Pleasant, well-nourished, in no distress, normal affect  HEENT: /AT, face symmetric, conjunctiva clear, no scleral icterus, PERRLA, EOMI, nares patent without drainage Lungs: no accessory muscle use, CTAB, no wheezes or rales Cardiovascular: RRR, no m/r/g, no peripheral edema Musculoskeletal: No deformities, no cyanosis or clubbing, normal tone Neuro:  A&Ox3, CN II-XII intact, normal gait Skin:  Warm, dry, no rash.  R palm proximal to thenar eminence with area of hard, thickened skin with a dark appearing line partially visible underneath the skin.  Procedure Note: Splinter removal  Indications: foreign body aka splinter imbedded in R hand palmar surface.  Consent obtained.  R/B/A discussed.  Allergies Reviewed-allergy to iodine.  Pt currently on coumadin.  Skin cleaned thoroughly with EtOH.   Less than 1 cc of lidocaine 1% with Epi injected superficially in R palm.  A 10 blade scalpel used to remove the foreign body from the skin.  A small amount of purulent drainage expelled from the  wound.  Wound rinsed.  Triple antibiotic ointment applied.  And wound dressed with an adhesive bandage.   Wt Readings from Last 3 Encounters:  12/31/20 135 lb (61.2 kg)  12/09/20 135 lb (61.2 kg)  12/02/20 134 lb 7.7 oz (61 kg)    Lab Results  Component Value Date   WBC 7.4 12/02/2020   HGB 12.6 (L) 12/02/2020   HCT 39.3 12/02/2020   PLT 147 (L) 12/02/2020   GLUCOSE 107 (H) 12/02/2020   CHOL 147 05/01/2019   TRIG 102 05/01/2019   HDL 54  05/01/2019   LDLCALC 73 05/01/2019   ALT 27 12/02/2020   AST 27 12/02/2020   NA 139 12/02/2020   K 4.1 12/02/2020   CL 106 12/02/2020   CREATININE 0.95 12/02/2020   BUN 11 12/02/2020   CO2 25 12/02/2020   TSH 2.740 11/04/2020   PSA 1.25 08/27/2014   INR 1.8 (A) 12/31/2020   HGBA1C 6.0 (H) 03/06/2015    Assessment/Plan:  Splinter of hand without major open wound or infection, right, initial encounter -consent obtained.  Splint removed from right hand.  Patient tolerated procedure well. -tetanus up to date.  Done 05/15/2013 -Given precautions.  Pt to monitor for signs and symptoms of infection -given handout  F/u prn  Grier Mitts, MD

## 2020-12-31 NOTE — Patient Instructions (Addendum)
Your last tetanus shot was in 2014.  It is good for 10 yrs.  Monitor your hand for any signs of infection including pain, swelling, redness, increased warmth, pus or other drainage.  These are reasons to call the clinic or proceed to your nearest urgent care or emergency department. Skin Foreign Body A skin foreign body is an object that is stuck in the skin. Common objects that get stuck in the skin include:  Wood (splinter).  Glass.  Rock.  Nails.  Needles.  Thorns or cactus spines.  Fiberglass slivers.  Fish hooks.  BBs. Foreign bodies may damage tissue or cause infection. If the foreign body does not cause any pain or infection, it may be okay to leave it in the skin. A growth called a granuloma may form around a foreign body that is left in the skin. What are the causes? This condition is caused by an object getting lodged under the skin, usually by accident. Children may get a skin foreign body while playing outside. Adults may get a skin foreign body after breaking glass or while working with wood, fiberglass, or stone material. In some cases, the object may get stuck in an open wound after an injury. What are the signs or symptoms? Symptoms of this condition include:  Pain.  A feeling of something being stuck under the skin. How is this diagnosed? This condition is diagnosed based on:  Your medical history and symptoms.  A physical exam.  Imaging tests, such as: ? X-rays. ? CT scans. ? Ultrasounds. How is this treated? Treatment for this condition depends on what the foreign body is, where it is, and whether it is causing infection or other symptoms. Treatment may involve:  Flushing the affected area with a salt-water solution to remove dirt or debris.  Removing all or part of the object with a needle and metal tweezers. In some cases, an incision may be made in the skin to allow access to the object.  Waiting to remove the object until it moves closer to  the surface of the skin. This may take several days.  Leaving the object in place. This may be done if the object is not causing any symptoms or if removal will cause more damage to the skin or tissue.  Taking antibiotic pills or using antibiotic ointment to treat or prevent infection.  Having a surgical procedure to remove a foreign body that is deep inside the tissue or that has been covered by a granuloma. Follow these instructions at home: Wound or incision care  If the foreign body was removed, follow instructions from your health care provider about how to take care of your wound or incision. Make sure you: ? Wash your hands with soap and water before and after you change your bandage (dressing). If soap and water are not available, use hand sanitizer. ? Change your dressing as told by your health care provider. ? Leave stitches (sutures), skin glue, or adhesive strips in place. These skin closures may need to stay in place for 2 weeks or longer. If adhesive strip edges start to loosen and curl up, you may trim the loose edges. Do not remove adhesive strips completely unless your health care provider tells you to do that.  Check your wound or incision every day for signs of infection. This is especially important if the foreign body was left in place in the skin. Check for: ? Redness, swelling, or pain. ? Fluid or blood. ? Pus or a  bad smell. ? Warmth.  If the foreign body was in your lip, you may be directed to rinse your mouth with a salt-water mixture 3-4 times per day or as needed. To make a salt-water mixture, completely dissolve -1 tsp (3-6 g) of salt in 1 cup (237 mL) of warm water.   General instructions  Take over-the-counter and prescription medicines only as told by your health care provider.  If you were prescribed an antibiotic medicine or ointment, use it as told by your health care provider. Do not stop using the antibiotic even if you start to feel better.  Keep all  follow-up visits as told by your health care provider. This is important. Contact a health care provider if:  You develop more pain or other new symptoms around the area where the object entered the skin.  You have redness, swelling, or pain around your wound or incision.  You have fluid or blood coming from your wound or incision.  Your wound or incision feels warm to the touch.  You have pus or a bad smell coming from your wound or incision.  You have a fever. Get help right away if:  You have severe pain that does not get better with medicine. Summary  A skin foreign body is an object that is stuck in the skin. Common objects that get stuck in the skin include wood, glass, rock, thorns, and fiberglass slivers.  Treatment for this condition depends on what the foreign body is, where it is, and whether it is causing infection or other symptoms.  Treatment may include removing the foreign body or leaving it in place. It is important to watch the wound or incision for signs of infection, especially if the object was left in place in the skin. This information is not intended to replace advice given to you by your health care provider. Make sure you discuss any questions you have with your health care provider. Document Revised: 09/16/2020 Document Reviewed: 09/16/2020 Elsevier Patient Education  2021 [employer].

## 2021-01-07 ENCOUNTER — Ambulatory Visit: Payer: Medicare Other

## 2021-01-18 ENCOUNTER — Other Ambulatory Visit: Payer: Self-pay | Admitting: Family Medicine

## 2021-01-18 DIAGNOSIS — Z7901 Long term (current) use of anticoagulants: Secondary | ICD-10-CM

## 2021-01-25 ENCOUNTER — Telehealth: Payer: Self-pay | Admitting: Family Medicine

## 2021-01-25 NOTE — Telephone Encounter (Signed)
Left message for patient to call back and schedule Medicare Annual Wellness Visit (AWV) either virtually or in office.   Last AWV 01/29/20 please schedule at anytime with [facility] Nurse Health Advisor 1 or 2   This should be a 45 minute visit. 

## 2021-01-26 ENCOUNTER — Other Ambulatory Visit: Payer: Self-pay | Admitting: Family Medicine

## 2021-01-26 DIAGNOSIS — R059 Cough, unspecified: Secondary | ICD-10-CM

## 2021-01-26 DIAGNOSIS — R062 Wheezing: Secondary | ICD-10-CM

## 2021-01-28 ENCOUNTER — Other Ambulatory Visit: Payer: Self-pay

## 2021-01-28 ENCOUNTER — Ambulatory Visit (INDEPENDENT_AMBULATORY_CARE_PROVIDER_SITE_OTHER): Payer: Medicare Other | Admitting: General Practice

## 2021-01-28 DIAGNOSIS — Z7901 Long term (current) use of anticoagulants: Secondary | ICD-10-CM | POA: Diagnosis not present

## 2021-01-28 DIAGNOSIS — Z952 Presence of prosthetic heart valve: Secondary | ICD-10-CM | POA: Diagnosis not present

## 2021-01-28 DIAGNOSIS — I482 Chronic atrial fibrillation, unspecified: Secondary | ICD-10-CM

## 2021-01-28 LAB — POCT INR: INR: 3.6 — AB (ref 2.0–3.0)

## 2021-01-28 NOTE — Patient Instructions (Addendum)
Pre visit review using our clinic review tool, if applicable. No additional management support is needed unless otherwise documented below in the visit note.  Skip dosage tomorrow (2/25) and take 1/2 tablet (2 mg) on Saturday.  On Sunday change dosage and take 1 (4 mg) tablet daily.  Re-check in 3 weeks.

## 2021-01-28 NOTE — Progress Notes (Signed)
Medical screening examination/treatment/procedure(s) were performed by non-physician practitioner and as supervising physician I was immediately available for consultation/collaboration. I agree with above. Karinna Beadles, MD   

## 2021-02-02 DIAGNOSIS — L820 Inflamed seborrheic keratosis: Secondary | ICD-10-CM | POA: Diagnosis not present

## 2021-02-02 DIAGNOSIS — C44519 Basal cell carcinoma of skin of other part of trunk: Secondary | ICD-10-CM | POA: Diagnosis not present

## 2021-02-02 DIAGNOSIS — L570 Actinic keratosis: Secondary | ICD-10-CM | POA: Diagnosis not present

## 2021-02-02 DIAGNOSIS — L821 Other seborrheic keratosis: Secondary | ICD-10-CM | POA: Diagnosis not present

## 2021-02-02 DIAGNOSIS — L817 Pigmented purpuric dermatosis: Secondary | ICD-10-CM | POA: Diagnosis not present

## 2021-02-02 DIAGNOSIS — D485 Neoplasm of uncertain behavior of skin: Secondary | ICD-10-CM | POA: Diagnosis not present

## 2021-02-18 ENCOUNTER — Other Ambulatory Visit: Payer: Self-pay

## 2021-02-18 ENCOUNTER — Ambulatory Visit (INDEPENDENT_AMBULATORY_CARE_PROVIDER_SITE_OTHER): Payer: Medicare Other | Admitting: General Practice

## 2021-02-18 DIAGNOSIS — Z952 Presence of prosthetic heart valve: Secondary | ICD-10-CM | POA: Diagnosis not present

## 2021-02-18 DIAGNOSIS — I482 Chronic atrial fibrillation, unspecified: Secondary | ICD-10-CM | POA: Diagnosis not present

## 2021-02-18 DIAGNOSIS — Z7901 Long term (current) use of anticoagulants: Secondary | ICD-10-CM | POA: Diagnosis not present

## 2021-02-18 LAB — POCT INR: INR: 2.5 (ref 2.0–3.0)

## 2021-02-18 NOTE — Patient Instructions (Signed)
Pre visit review using our clinic review tool, if applicable. No additional management support is needed unless otherwise documented below in the visit note.  Continue to take 1 (4 mg) tablet daily.  Re-check in 4 weeks.

## 2021-02-18 NOTE — Progress Notes (Signed)
Medical screening examination/treatment/procedure(s) were performed by non-physician practitioner and as supervising physician I was immediately available for consultation/collaboration. I agree with above. Rockelle Heuerman, MD   

## 2021-02-19 ENCOUNTER — Telehealth: Payer: Self-pay | Admitting: Pharmacist

## 2021-02-19 NOTE — Chronic Care Management (AMB) (Signed)
    Chronic Care Management Pharmacy Assistant   Name: Jesus [name]  MRN: [id number] DOB: May 07, 1941   I attempted to reach the patient on several occasions for her Hypertension adherence call. I left several messages with no return phone call.  Maia Breslow, Reading Assistant [phone]

## 2021-03-01 ENCOUNTER — Telehealth: Payer: Self-pay | Admitting: Family Medicine

## 2021-03-01 NOTE — Telephone Encounter (Signed)
Left message for patient to call back and schedule Medicare Annual Wellness Visit (AWV) either virtually or in office. No detailed message left    Last AWV 01/29/20  please schedule at anytime with [facility] Nurse Health Advisor 1 or 2   This should be a 45 minute visit.

## 2021-03-08 ENCOUNTER — Other Ambulatory Visit: Payer: Self-pay | Admitting: Cardiovascular Disease

## 2021-03-08 DIAGNOSIS — I480 Paroxysmal atrial fibrillation: Secondary | ICD-10-CM

## 2021-03-08 DIAGNOSIS — I10 Essential (primary) hypertension: Secondary | ICD-10-CM

## 2021-03-18 ENCOUNTER — Other Ambulatory Visit: Payer: Self-pay

## 2021-03-18 ENCOUNTER — Ambulatory Visit (INDEPENDENT_AMBULATORY_CARE_PROVIDER_SITE_OTHER): Payer: Medicare Other | Admitting: General Practice

## 2021-03-18 DIAGNOSIS — I482 Chronic atrial fibrillation, unspecified: Secondary | ICD-10-CM

## 2021-03-18 DIAGNOSIS — Z7901 Long term (current) use of anticoagulants: Secondary | ICD-10-CM

## 2021-03-18 DIAGNOSIS — Z952 Presence of prosthetic heart valve: Secondary | ICD-10-CM | POA: Diagnosis not present

## 2021-03-18 LAB — POCT INR: INR: 3.2 — AB (ref 2.0–3.0)

## 2021-03-18 NOTE — Patient Instructions (Signed)
Pre visit review using our clinic review tool, if applicable. No additional management support is needed unless otherwise documented below in the visit note.  Skip coumadin today and then continue to take 1 (4 mg) tablet daily.  Re-check in 4 weeks.

## 2021-03-18 NOTE — Progress Notes (Signed)
Medical screening examination/treatment/procedure(s) were performed by non-physician practitioner and as supervising physician I was immediately available for consultation/collaboration. I agree with above. Johnnetta Holstine, MD   

## 2021-04-15 ENCOUNTER — Other Ambulatory Visit: Payer: Self-pay

## 2021-04-15 ENCOUNTER — Ambulatory Visit (INDEPENDENT_AMBULATORY_CARE_PROVIDER_SITE_OTHER): Payer: Medicare Other | Admitting: General Practice

## 2021-04-15 DIAGNOSIS — Z952 Presence of prosthetic heart valve: Secondary | ICD-10-CM

## 2021-04-15 DIAGNOSIS — Z7901 Long term (current) use of anticoagulants: Secondary | ICD-10-CM | POA: Diagnosis not present

## 2021-04-15 DIAGNOSIS — I482 Chronic atrial fibrillation, unspecified: Secondary | ICD-10-CM

## 2021-04-15 LAB — POCT INR: INR: 2.6 (ref 2.0–3.0)

## 2021-04-15 NOTE — Patient Instructions (Addendum)
Pre visit review using our clinic review tool, if applicable. No additional management support is needed unless otherwise documented below in the visit note.  Continue to take 1 (4 mg) tablet daily.  Re-check in 4 to 5 weeks.

## 2021-04-15 NOTE — Progress Notes (Signed)
Medical screening examination/treatment/procedure(s) were performed by non-physician practitioner and as supervising physician I was immediately available for consultation/collaboration. I agree with above. River Ambrosio, MD   

## 2021-04-16 ENCOUNTER — Other Ambulatory Visit: Payer: Self-pay | Admitting: Family Medicine

## 2021-04-16 NOTE — Telephone Encounter (Signed)
Last office visit- 12/31/20  No future office visit scheduled

## 2021-04-17 NOTE — Telephone Encounter (Signed)
OK to refill

## 2021-04-23 DIAGNOSIS — H43813 Vitreous degeneration, bilateral: Secondary | ICD-10-CM | POA: Diagnosis not present

## 2021-04-23 DIAGNOSIS — H52203 Unspecified astigmatism, bilateral: Secondary | ICD-10-CM | POA: Diagnosis not present

## 2021-04-23 DIAGNOSIS — H5203 Hypermetropia, bilateral: Secondary | ICD-10-CM | POA: Diagnosis not present

## 2021-04-28 ENCOUNTER — Telehealth: Payer: Self-pay | Admitting: Pharmacist

## 2021-04-28 NOTE — Chronic Care Management (AMB) (Signed)
    Chronic Care Management Pharmacy Assistant   Name: Jesus [name]  MRN: [id number] DOB: Aug 17, 1941  Reason for Encounter: General Adherence Call       Recent office visits:  None  Recent consult visits:  None  Hospital visits:  None in previous 6 months  Medications: Outpatient Encounter Medications as of 04/28/2021  Medication Sig  . acetaminophen (TYLENOL) 500 MG tablet Take 500 mg by mouth every 6 (six) hours as needed for moderate pain.  [name] albuterol (VENTOLIN HFA) 108 (90 Base) MCG/ACT inhaler INHALE 2 PUFFS BY MOUTH EVERY 4 HOURS ASNEEDED FOR WHEEZING OR SHORTNESS OF BREATH.  [name] amiodarone (PACERONE) 200 MG tablet TAKE 1/2 TABLET BY MOUTH DAILY  . amLODipine (NORVASC) 5 MG tablet TAKE 1 TABLET BY MOUTH ONCE DAILY  . amoxicillin (AMOXIL) 400 MG/5ML suspension TAKE 5 TEASPOONFULS BY MOUTH 1 HOUR PRIOR TO DENTAL PROCEDURE  . amoxicillin (AMOXIL) 500 MG capsule TAKE 4 CAPSULES BY MOUTH 1 HOUR PRIOR TODENTAL APPOINTMENT  . Ascorbic Acid (VITAMIN C) 1000 MG tablet Take 1,000 mg by mouth daily.  [name] aspirin 81 MG tablet Take 81 mg by mouth daily.  [name] atorvastatin (LIPITOR) 80 MG tablet TAKE 1/2 TABLET BY MOUTH DAILY  . loratadine (CLARITIN) 10 MG tablet Take 10 mg by mouth daily as needed for allergies.  [name] losartan (COZAAR) 100 MG tablet TAKE 1 TABLET BY MOUTH ONCE DAILY  . Multiple Vitamins-Minerals (MULTIVITAMIN,TX-MINERALS) tablet Take 1 tablet by mouth daily.  [name] warfarin (COUMADIN) 4 MG tablet TAKE 1 TABLET BY MOUTH DAILY OR TAKE AS DIRECTED BY ANTICOAGULATION CLINIC   No facility-administered encounter medications on file as of 04/28/2021.   I spoke with the patient about medication adherence. He stated that he has been doing well. He states that he takes his blood pressure sometimes. It was suggested that he get a notebook to help keep track of it. He understood. he states that he has no current issues with his medications. There have been no recent changes to his medications.  the patient was scheduled to have a CCM visit on May 26 unfortunately he would not be able to make this. His wife has an appointment that conflicts with this and his appointment was rescheduled to July 14th, 2022, at 3:00 PM. He continues to work part-time. He takes his medications as prescribed. There have been no urgent care or emergency department visits since his last CPP or CCM visit. He does not have any issues with his pharmacy.   Star Rating Drugs:  Dispensed Quantity Pharmacy  Atorvastatin 80 mg 04.01.2022 [facility]  Losartan 100 mg [facility].14.2022 [facility]   Amilia Revonda Standard, [place] Pharmacist Assistant [phone]

## 2021-04-29 ENCOUNTER — Telehealth: Payer: Medicare Other

## 2021-05-10 ENCOUNTER — Other Ambulatory Visit: Payer: Medicare Other

## 2021-05-10 ENCOUNTER — Other Ambulatory Visit (HOSPITAL_COMMUNITY): Payer: Medicare Other

## 2021-05-12 ENCOUNTER — Other Ambulatory Visit: Payer: Self-pay

## 2021-05-12 ENCOUNTER — Encounter: Payer: Self-pay | Admitting: Cardiovascular Disease

## 2021-05-12 ENCOUNTER — Ambulatory Visit: Payer: Medicare Other | Admitting: Cardiovascular Disease

## 2021-05-12 VITALS — BP 130/58 | HR 71 | Ht 61.0 in | Wt 128.2 lb

## 2021-05-12 DIAGNOSIS — I359 Nonrheumatic aortic valve disorder, unspecified: Secondary | ICD-10-CM

## 2021-05-12 DIAGNOSIS — E782 Mixed hyperlipidemia: Secondary | ICD-10-CM

## 2021-05-12 DIAGNOSIS — I5042 Chronic combined systolic (congestive) and diastolic (congestive) heart failure: Secondary | ICD-10-CM

## 2021-05-12 DIAGNOSIS — I2510 Atherosclerotic heart disease of native coronary artery without angina pectoris: Secondary | ICD-10-CM

## 2021-05-12 DIAGNOSIS — I480 Paroxysmal atrial fibrillation: Secondary | ICD-10-CM

## 2021-05-12 NOTE — Progress Notes (Signed)
Cardiology Office Note:    Date:  05/12/2021   ID:  Jesus [name], DOB 08/11/1941, MRN [id number]  PCP:  Jesus Post, MD   [place] Providers Cardiologist:  Jesus Mocha, MD     Referring MD: Jesus Post, MD   Chief Complaint  Patient presents with  . Coronary Artery Disease    History of Present Illness:    Jesus [name] is a 80 y.o. male with a hx of coronary artery disease with remote MI, aortic valve disease status [name] TAVR, and persistent atrial fibrillation maintaining sinus rhythm on long-term amiodarone.  LVEF has generally ranged from 40 to 45%.  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  Jesus [name] is here alone today.  His wife, Jesus [name], died last week.  They were married for 45 years.  He is obviously in the midst of grieving her death.  Today, he denies symptoms of palpitations, chest pain, shortness of breath, orthopnea, PND, lower extremity edema, dizziness, or syncope.   Past Medical History:  Diagnosis Date  . Acute myocardial infarction of inferior wall ([place]) 1980  . Aortic stenosis    mild with a mean aortic valve gradient of 12 mmHg  . Atrial fibrillation (HCC)    holding sinus rhythm on Amiodarone  . CAD (coronary artery disease)    a. S/P Ant MI 1980;  b. 1997 S/P CABG x 8 (LIMA to diag-LAD, SVG to OM1-OM2-OM3, SVG to [facility] - Dr [name]);  c. 01/2015 Cath: 3VD, 8/8 patent grafts.  . Cardiomyopathy   . Dysrhythmia   . Esophageal reflux   . GERD (gastroesophageal reflux disease)   . Headache   . History of colonoscopy   . History of transesophageal echocardiography (TEE) for monitoring   . Other and unspecified hyperlipidemia   . S/P TAVR (transcatheter aortic valve replacement)    a. 03/2015 26 mm Edwards Sapien 3 transcatheter heart valve placed via open left transfemoral approach.  . Skin lesions, generalized    facial which [name] represent actinic keratoses and possible  photosensitivity from Amiodarone  . Unspecified essential hypertension     Past Surgical History:  Procedure Laterality Date  . CARDIAC CATHETERIZATION    . CARDIOVERSION  11/18/2006   Dr. [name]  . CATARACT EXTRACTION W/ INTRAOCULAR LENS IMPLANT  April '13  (Dr. [name])   left eye only  . CHOLECYSTECTOMY    . CORONARY ARTERY BYPASS GRAFT  02/09/1996   LIMA to diag-LAD, SVG to OM1-OM2-OM3, SVG to [facility]  . EYE SURGERY    . LEFT AND RIGHT HEART CATHETERIZATION WITH CORONARY/GRAFT ANGIOGRAM N/A 01/26/2015   Procedure: LEFT AND RIGHT HEART CATHETERIZATION WITH [name];  Surgeon: [name], MD;  Location[facility] C[facility] LAB;  Service: Cardiovascular;  Laterality: N/A;  . TEE WITHOUT CARDIOVERSION N/A 03/10/2015   Procedure: TRANSESOPHAGEAL ECHOCARDIOGRAM (TEE);  Surgeon: Jesus Mocha, MD;  Location: [place];  Service: Open Heart Surgery;  Laterality: N/A;  . TONSILLECTOMY    . TRANSCATHETER AORTIC VALVE REPLACEMENT, TRANSFEMORAL N/A 03/10/2015   Procedure: TRANSCATHETER AORTIC VALVE REPLACEMENT, TRANSFEMORAL;  Surgeon: Jesus Mocha, MD;  Location: [place] [facility];  Service: Open Heart Surgery;  Laterality: N/A;    Current Medications: Current Meds  Medication Sig  . acetaminophen (TYLENOL) 500 MG tablet Take 500 mg by mouth every 6 (six) hours as needed for moderate pain.  [name] albuterol (VENTOLIN HFA) 108 (90 Base) MCG/ACT inhaler INHALE 2 PUFFS BY MOUTH EVERY 4 HOURS  ASNEEDED FOR WHEEZING OR SHORTNESS OF BREATH.  [name] amiodarone (PACERONE) 200 MG tablet TAKE 1/2 TABLET BY MOUTH DAILY  . amLODipine (NORVASC) 5 MG tablet TAKE 1 TABLET BY MOUTH ONCE DAILY  . amoxicillin (AMOXIL) 400 MG/5ML suspension TAKE 5 TEASPOONFULS BY MOUTH 1 HOUR PRIOR TO DENTAL PROCEDURE  . Ascorbic Acid (VITAMIN C) 1000 MG tablet Take 1,000 mg by mouth daily.  [name] aspirin 81 MG tablet Take 81 mg by mouth daily.  [name] atorvastatin (LIPITOR) 80 MG tablet TAKE 1/2 TABLET BY MOUTH DAILY  . loratadine (CLARITIN)  10 MG tablet Take 10 mg by mouth daily as needed for allergies.  [name] losartan (COZAAR) 100 MG tablet TAKE 1 TABLET BY MOUTH ONCE DAILY  . Multiple Vitamins-Minerals (MULTIVITAMIN,TX-MINERALS) tablet Take 1 tablet by mouth daily.  [name] warfarin (COUMADIN) 4 MG tablet TAKE 1 TABLET BY MOUTH DAILY OR TAKE AS DIRECTED BY ANTICOAGULATION CLINIC     Allergies:   Codeine and Iodine   Social History   Socioeconomic History  . Marital status: Married    Spouse name: Not on file  . Number of children: Not on file  . Years of education: Not on file  . Highest education level: Not on file  Occupational History  . Not on file  Tobacco Use  . Smoking status: Never Smoker  . Smokeless tobacco: Never Used  . Tobacco comment: Does not smoke.  Vaping Use  . Vaping Use: Never used  Substance and Sexual Activity  . Alcohol use: No    Alcohol/week: 0.0 standard drinks  . Drug use: No  . Sexual activity: Not Currently  Other Topics Concern  . Not on file  Social History Narrative   HSG. [employer] 6 years. Married - '69 - 1 year/divorced. '76 - . No children. Work - mfg/textiles - Designer, multimedia; currently works doing maintenance. [employer] - they have discussed this. Provided packet august '13.                Social Determinants of Health   Financial Resource Strain: Not on file  Food Insecurity: Not on file  Transportation Needs: Not on file  Physical Activity: Not on file  Stress: Not on file  Social Connections: Not on file     Family History: The patient's family history includes Atrial fibrillation in his brother; Crohn's disease in his mother; Heart attack (age of onset: 38) in his father; Heart disease in his father and paternal uncle; Heart failure in his father; Prostate cancer in his paternal uncle; Stroke (age of onset: 52) in his mother. There is no history of Colon cancer.  ROS:   Please see the history of present illness.    All other systems reviewed and are  negative.  EKGs/Labs/Other Studies Reviewed:    The following studies were reviewed today: Echocardiogram 05/01/2020: 1. Left ventricular ejection fraction, by estimation, is 40 to 45%. The  left ventricle has mildly decreased function. The left ventricle  demonstrates global hypokinesis. There is mild left ventricular  hypertrophy. Left ventricular diastolic parameters  are consistent with Grade I diastolic dysfunction (impaired relaxation).  2. Right ventricular systolic function is normal. The right ventricular  size is normal.  3. Left atrial size was moderately dilated.  4. The mitral valve is normal in structure. Mild mitral valve  regurgitation. No evidence of mitral stenosis.  5. Mild to moderate perivalvular leak adjacent to mitral annulus. The  aortic valve has been repaired/replaced. Aortic valve regurgitation is not  visualized. No aortic stenosis is present. There is a 26 mm Edwards Sapien  prosthetic (TAVR) valve present  in the aortic position. Aortic valve mean gradient measures 14.0 mmHg.  Aortic valve Vmax measures 2.61 m/s.  6. The inferior vena cava is normal in size with greater than 50%  respiratory variability, suggesting right atrial pressure of 3 mmHg.   Comparison(s): No significant change from prior study. Prior images  reviewed side by side.   EKG:  EKG is not ordered today.  Recent Labs: 11/04/2020: TSH 2.740 12/02/2020: ALT 27; BUN 11; Creatinine, Ser 0.95; Hemoglobin 12.6; Platelets 147; Potassium 4.1; Sodium 139  Recent Lipid Panel    Component Value Date/Time   CHOL 147 05/01/2019 0903   TRIG 102 05/01/2019 0903   TRIG 67 11/20/2006 0748   HDL 54 05/01/2019 0903   CHOLHDL 2.7 05/01/2019 0903   CHOLHDL 3.0 09/12/2016 1311   VLDL 24 09/12/2016 1311   LDLCALC 73 05/01/2019 0903     Risk Assessment/Calculations:    CHA2DS2-VASc Score = 5  This indicates a 7.2% annual risk of stroke. The patient's score is based upon: CHF History:  Yes HTN History: Yes Diabetes History: No Stroke History: No Vascular Disease History: Yes Age Score: 2 Gender Score: 0      Physical Exam:    VS:  BP (!) 130/58   Pulse 71   Ht 5\' 1"  (1.549 m)   Wt 128 lb 3.2 oz (58.2 kg)   SpO2 98%   BMI 24.22 kg/m     Wt Readings from Last 3 Encounters:  05/12/21 128 lb 3.2 oz (58.2 kg)  12/31/20 135 lb (61.2 kg)  12/09/20 135 lb (61.2 kg)     GEN:  Well nourished, well developed in no acute distress HEENT: Normal NECK: No JVD; No carotid bruits LYMPHATICS: No lymphadenopathy CARDIAC: RRR, 2/6 systolic ejection murmur at the right upper sternal border, 2/6 diastolic decrescendo murmur at the right upper sternal border RESPIRATORY:  Clear to auscultation without rales, wheezing or rhonchi  ABDOMEN: Soft, non-tender, non-distended MUSCULOSKELETAL:  No edema; No deformity  SKIN: Warm and dry NEUROLOGIC:  Alert and oriented x 3 PSYCHIATRIC:  Normal affect   ASSESSMENT:    1. Paroxysmal atrial fibrillation (HCC)   2. Coronary artery disease involving native coronary artery of native heart without angina pectoris   3. Aortic valve disorder   4. Chronic combined systolic and diastolic CHF (congestive heart failure) ([place])   5. Mixed hyperlipidemia    PLAN:    In order of problems listed above:  1. Maintaining sinus rhythm on amiodarone.  Anticoagulated with warfarin.  Update amiodarone monitoring labs when he returns for his echocardiogram later this month. 2. No angina at present.  Treated with low-dose aspirin.  Treated with a high intensity statin drug. 3. The patient is status [name] TAVR with mild to moderate paravalvular regurgitation demonstrated on previous echo studies.  His exam is unchanged compared to prior.  Repeat echo is scheduled later this month. 4. Patient has been stable with mildly reduced LVEF in the setting of a remote inferolateral infarction.  Treated with losartan.  Essentially asymptomatic. 5. Treated with  atorvastatin 40 mg daily.  We will schedule fasting labs when he returns later this month.         Medication Adjustments/Labs and Tests Ordered: Current medicines are reviewed at length with the patient today.  Concerns regarding medicines are outlined above.  No orders of the defined types were placed in  this encounter.  No orders of the defined types were placed in this encounter.   Patient Instructions  Medication Instructions:  Your provider recommends that you continue on your current medications as directed. Please refer to the Current Medication list given to you today.   *If you need a refill on your cardiac medications before your next appointment, please call your pharmacy*  Lab Work: Come FASTING to your echo appointment 6/29. We will draw labs that day. If you have labs (blood work) drawn today and your tests are completely normal, you will receive your results only by: [name] MyChart Message (if you have MyChart) OR . A paper copy in the mail If you have any lab test that is abnormal or we need to change your treatment, we will call you to review the results.  Testing/Procedures: Keep your echo appointment as scheduled.  Follow-Up: At [facility], you and your health needs are our priority.  As part of our continuing mission to provide you with exceptional heart care, we have created designated Provider Care Teams.  These Care Teams include your primary Cardiologist (physician) and Advanced Practice Providers (APPs -  Physician Assistants and Nurse Practitioners) who all work together to provide you with the care you need, when you need it. Your next appointment:   6 month(s) The format for your next appointment:   In Person Provider:   You [name] see Jesus Mocha, MD or one of the following Advanced Practice Providers on your designated Care Team:    [name], PA-C  Robbie Lis, [place]      Signed, Jesus Mocha, MD  05/12/2021 10:34 AM    [place]

## 2021-05-12 NOTE — Patient Instructions (Signed)
Medication Instructions:  Your provider recommends that you continue on your current medications as directed. Please refer to the Current Medication list given to you today.   *If you need a refill on your cardiac medications before your next appointment, please call your pharmacy*  Lab Work: Come FASTING to your echo appointment 6/29. We will draw labs that day. If you have labs (blood work) drawn today and your tests are completely normal, you will receive your results only by: [name] MyChart Message (if you have MyChart) OR . A paper copy in the mail If you have any lab test that is abnormal or we need to change your treatment, we will call you to review the results.  Testing/Procedures: Keep your echo appointment as scheduled.  Follow-Up: At [facility], you and your health needs are our priority.  As part of our continuing mission to provide you with exceptional heart care, we have created designated Provider Care Teams.  These Care Teams include your primary Cardiologist (physician) and Advanced Practice Providers (APPs -  Physician Assistants and Nurse Practitioners) who all work together to provide you with the care you need, when you need it. Your next appointment:   6 month(s) The format for your next appointment:   In Person Provider:   You may see [name], MD or one of the following Advanced Practice Providers on your designated Care Team:    [name], PA-C  [place], [place]

## 2021-05-14 ENCOUNTER — Telehealth: Payer: Self-pay | Admitting: Family Medicine

## 2021-05-14 NOTE — Telephone Encounter (Signed)
Left message for patient to call back and schedule Medicare Annual Wellness Visit (AWV) either virtually or in office.   Last AWV 01/29/20  please schedule at anytime with [facility] Nurse Health Advisor 1 or 2   This should be a 45 minute visit.

## 2021-05-17 ENCOUNTER — Telehealth: Payer: Medicare Other

## 2021-05-18 ENCOUNTER — Telehealth: Payer: Self-pay | Admitting: Pharmacist

## 2021-05-18 ENCOUNTER — Telehealth: Payer: Medicare Other

## 2021-05-18 NOTE — Telephone Encounter (Signed)
  Chronic Care Management   Outreach Note  05/18/2021 Name: Jesus [name] MRN: [id number] DOB: 1941-01-04  Referred by: [name], MD  Patient had a phone appointment scheduled with clinical pharmacist today.  An unsuccessful telephone outreach was attempted today. The patient was referred to the pharmacist for assistance with care management and care coordination.   If possible, a message was left to return call to: [phone] or to [facility]: [place], PharmD, [place] [facility] [place]

## 2021-05-18 NOTE — Progress Notes (Deleted)
Chronic Care Management Pharmacy Note  05/18/2021 Name:  Jesus [name] MRN:  [id number] DOB:  11-16-1941  Summary:   Recommendations/Changes made from today's visit:   Plan:    Subjective: Jesus [name] is an 80 y.o. year old male who is a primary patient of [name], [name], MD.  The CCM team was consulted for assistance with disease management and care coordination needs.    Engaged with patient by telephone for follow up visit in response to provider referral for pharmacy case management and/or care coordination services.   Consent to Services:  The patient was given information about Chronic Care Management services, agreed to services, and gave verbal consent prior to initiation of services.  Please see initial visit note for detailed documentation.   Patient Care Team: [name], MD as PCP - General (Family Medicine) [name], MD as PCP - Cardiology (Cardiology) [name], [facility] as Pharmacist (Pharmacist)  Recent office visits: 12/31/20 Grier Mitts, MD: Patient presented for splinter in hand.   12/09/20 [name], MD: Patient presented for post ED visit and left hip pain. X-ray showed osteopenia.  Recent consult visits: 05/12/21 [name], MD (cardiology): Patient presented for CAD and Afib follow up. Plan for labs later this month with echo.  04/15/21 Meriam Sprague, RN (Newborn): Patient presented for anti-coag visit. INR 2.6, goal 2-3. Continued 4 mg (4 mg x 1) every day.  Hospital visits: 12/02/20 Patient presented to the ED for rectal bleeding and multiple contusions.   Objective:  Lab Results  Component Value Date   CREATININE 0.95 12/02/2020   BUN 11 12/02/2020   GFR 104.41 01/21/2015   GFRNONAA >60 12/02/2020   GFRAA 90 11/04/2020   NA 139 12/02/2020   K 4.1 12/02/2020   CALCIUM 9.0 12/02/2020   CO2 25 12/02/2020   GLUCOSE 107 (H) 12/02/2020    Lab Results  Component Value Date/Time   HGBA1C 6.0 (H)  03/06/2015 02:45 PM   GFR 104.41 01/21/2015 08:27 AM   GFR 85.12 08/27/2014 01:35 PM    Last diabetic Eye exam: No results found for: HMDIABEYEEXA  Last diabetic Foot exam: No results found for: HMDIABFOOTEX   Lab Results  Component Value Date   CHOL 147 05/01/2019   HDL 54 05/01/2019   LDLCALC 73 05/01/2019   TRIG 102 05/01/2019   CHOLHDL 2.7 05/01/2019    Hepatic Function Latest Ref Rng & Units 12/02/2020 11/04/2020 05/01/2020  Total Protein 6.5 - 8.1 g/dL 6.8 6.1 6.3  Albumin 3.5 - 5.0 g/dL 3.8 4.0 3.9  AST 15 - 41 U/L _0 ALT 0 - 44 U/L _1 Alk Phosphatase 38 - 126 U/L 103 114 107  Total Bilirubin 0.3 - 1.2 mg/dL 1.0 0.5 0.5  Bilirubin, Direct 0.00 - 0.40 mg/dL - - -    Lab Results  Component Value Date/Time   TSH 2.740 11/04/2020 03:11 PM   TSH 2.520 05/01/2020 02:46 PM   FREET4 1.29 03/20/2017 02:41 PM   FREET4 0.9 10/19/2009 02:40 PM    CBC Latest Ref Rng & Units 12/02/2020 05/01/2020 09/03/2019  WBC 4.0 - 10.5 K/uL 7.4 9.6 6.2  Hemoglobin 13.0 - 17.0 g/dL 12.6(L) 12.6(L) 12.9(L)  Hematocrit 39.0 - 52.0 % 39.3 35.8(L) 38.6(L)  Platelets 150 - 400 K/uL 147(L) 160 133.0(L)    No results found for: VD25OH  Clinical ASCVD: {YES/NO:21197} The ASCVD Risk score Mikey Bussing DC Jr., et al., 2013) failed to calculate for the  following reasons:   The 2013 ASCVD risk score is only valid for ages 47 to 21    Depression screen PHQ 2/9 01/29/2020 10/30/2017 03/21/2016  Decreased Interest 0 0 0  Down, Depressed, Hopeless 0 0 0  PHQ - 2 Score 0 0 0  Altered sleeping 0 - -  Tired, decreased energy 0 - -  Change in appetite 0 - -  Feeling bad or failure about yourself  0 - -  Trouble concentrating 0 - -  Moving slowly or fidgety/restless 0 - -  Suicidal thoughts 0 - -  PHQ-9 Score 0 - -  Difficult doing work/chores Not difficult at all - -  Some recent data might be hidden     ***Other: (CHADS2VASc if Afib, MMRC or CAT for COPD, ACT, DEXA)  Social History    Tobacco Use  Smoking Status Never  Smokeless Tobacco Never  Tobacco Comments   Does not smoke.   BP Readings from Last 3 Encounters:  05/12/21 (!) 130/58  12/31/20 130/60  12/09/20 112/60   Pulse Readings from Last 3 Encounters:  05/12/21 71  12/31/20 (!) 59  12/09/20 64   Wt Readings from Last 3 Encounters:  05/12/21 128 lb 3.2 oz (58.2 kg)  12/31/20 135 lb (61.2 kg)  12/09/20 135 lb (61.2 kg)   BMI Readings from Last 3 Encounters:  05/12/21 24.22 kg/m  12/31/20 25.51 kg/m  12/09/20 25.51 kg/m    Assessment/Interventions: Review of patient past medical history, allergies, medications, health status, including review of consultants reports, laboratory and other test data, was performed as part of comprehensive evaluation and provision of chronic care management services.   SDOH:  (Social Determinants of Health) assessments and interventions performed: {yes/no:20286}  SDOH Screenings   Alcohol Screen: Not on file  Depression (PHQ2-9): Not on file  Financial Resource Strain: Not on file  Food Insecurity: Not on file  Housing: Not on file  Physical Activity: Not on file  Social Connections: Not on file  Stress: Not on file  Tobacco Use: Low Risk    Smoking Tobacco Use: Never   Smokeless Tobacco Use: Never  Transportation Needs: Not on file    [place]  Allergies  Allergen Reactions   Codeine Other (See Comments)    Pt does not remember   Iodine Swelling    Medications Reviewed Today     Reviewed by [name], MD (Physician) on 05/12/21 at 1034  Med List Status: <None>   Medication Order Taking? Sig Documenting Provider Last Dose Status Informant  acetaminophen (TYLENOL) 500 MG tablet [id number] Yes Take 500 mg by mouth every 6 (six) hours as needed for moderate pain. [provider] Taking Active Spouse/Significant Other  albuterol (VENTOLIN HFA) 108 (90 Base) MCG/ACT inhaler [id number] Yes INHALE 2 PUFFS BY MOUTH EVERY 4 HOURS  ASNEEDED FOR WHEEZING OR SHORTNESS OF BREATH. [name], MD Taking Active   amiodarone (PACERONE) 200 MG tablet [id number] Yes TAKE 1/2 TABLET BY MOUTH DAILY [name], MD Taking Active   amLODipine (NORVASC) 5 MG tablet [id number] Yes TAKE 1 TABLET BY MOUTH ONCE DAILY [name], MD Taking Active   amoxicillin (AMOXIL) 400 MG/5ML suspension [id number] Yes TAKE 5 TEASPOONFULS BY MOUTH 1 HOUR PRIOR TO DENTAL PROCEDURE [name], [name], MD Taking Active   Ascorbic Acid (VITAMIN C) 1000 MG tablet [id number] Yes Take 1,000 mg by mouth daily. [provider] Taking Active Spouse/Significant Other  aspirin 81 MG tablet [id number] Yes Take 81 mg by  mouth daily. [provider] Taking Active Spouse/Significant Other  atorvastatin (LIPITOR) 80 MG tablet [id number] Yes TAKE 1/2 TABLET BY MOUTH DAILY [name], [name], MD Taking Active   loratadine (CLARITIN) 10 MG tablet [id number] Yes Take 10 mg by mouth daily as needed for allergies. [provider] Taking Active Spouse/Significant Other  losartan (COZAAR) 100 MG tablet [id number] Yes TAKE 1 TABLET BY MOUTH ONCE DAILY [name], [name], MD Taking Active   Multiple Vitamins-Minerals (MULTIVITAMIN,TX-MINERALS) tablet [id number] Yes Take 1 tablet by mouth daily. [provider] Taking Active Spouse/Significant Other  warfarin (COUMADIN) 4 MG tablet [id number] Yes TAKE 1 TABLET BY MOUTH DAILY OR TAKE AS DIRECTED BY ANTICOAGULATION CLINIC Petoskey, [name], MD Taking Active             Patient Active Problem List   Diagnosis Date Noted   Chronic combined systolic and diastolic CHF (congestive heart failure) ([place]) 04/16/2018   Long term (current) use of anticoagulants 08/25/2017   Actinic keratoses 03/21/2017   Internal hemorrhoid, bleeding 07/01/2015   Rectal bleeding 06/15/2015   Internal hemorrhoids 06/15/2015   Other constipation 06/15/2015   Chronic anticoagulation-Couamdin 03/12/2015   S/P TAVR  (transcatheter aortic valve replacement) 03/10/2015   Anemia 09/03/2014   Encounter for therapeutic drug monitoring 12/31/2013   Yellow jacket sting 08/06/2013   Low back pain on right side with sciatica 04/24/2013   Severe aortic stenosis 08/10/2012   CAD (coronary artery disease) 08/10/2012   Left ventricular dysfunction 08/10/2012   Routine health maintenance 07/12/2012   BEE STING REACTION, LOCAL 09/21/2010   BEN LOC HYPERPLASIA PROS W/UR OBST & OTH LUTS 08/02/2010   Acute and chronic cholecystitis 02/01/2010   Abdominal pain, generalized 01/29/2010   CORONARY ATHEROSCLEROSIS NATIVE CORONARY ARTERY 10/13/2009   HYPERTHYROIDISM 05/22/2009   INSOMNIA 05/22/2009   Hyperlipidemia 04/22/2009   Cardiomyopathy, ischemic-EF 35% 04/22/2009   CAD, AUTOLOGOUS BYPASS GRAFT 10/16/2008   Essential hypertension 09/07/2007   Aortic valve disorder 09/07/2007   Chronic atrial fibrillation ([place]) 09/07/2007   GASTROESOPHAGEAL REFLUX DISEASE 09/07/2007   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    Immunization History  Administered Date(s) Administered   Fluad Quad(high Dose 65+) 10/04/2019, 10/08/2020   Influenza Whole 09/05/2001, 09/26/2008, 09/26/2009, 09/14/2010   Influenza, High Dose Seasonal PF 08/12/2015, 09/02/2016, 08/25/2017, 09/07/2018   Influenza,inj,Quad PF,6+ Mos 08/27/2014   Influenza-Unspecified 09/12/2012, 08/16/2013   PFIZER(Purple Top)SARS-COV-2 Vaccination 01/06/2020, 01/20/2020, 11/23/2020   Pneumococcal Conjugate-13 04/07/2014   Pneumococcal Polysaccharide-23 09/05/2001, 01/14/2010   Td 01/06/1999   Tetanus 05/15/2013   Zoster Recombinat (Shingrix) 09/06/2019, 02/19/2020   Zoster, Live 12/14/2011    Conditions to be addressed/monitored:  Hypertension, Hyperlipidemia, Atrial Fibrillation, Heart Failure, GERD, Osteopenia, Allergic Rhinitis, and Pain  There are no care plans that you recently modified to display for this patient.   Current Barriers:   {pharmacybarriers:24917}  Pharmacist Clinical Goal(s):  Patient will {PHARMACYGOALCHOICES:24921} through collaboration with PharmD and provider.   Interventions: 1:1 collaboration with [name], MD regarding development and update of comprehensive plan of care as evidenced by provider attestation and co-signature Inter-disciplinary care team collaboration (see longitudinal plan of care) Comprehensive medication review performed; medication list updated in electronic medical record  BP Readings from Last 3 Encounters:  05/12/21 (!) 130/58  12/31/20 130/60  12/09/20 112/60    Hypertension (BP goal <130/80) -Controlled -Current treatment: Amlodipine 5 mg 1 tablet daily - in AM Losartan 100 mg 1 tablet daily - in AM -Medications previously tried: ***  -Current home readings: *** -Current  dietary habits: *** -Current exercise habits: *** -{ACTIONS;DENIES/REPORTS:21021675} hypotensive/hypertensive symptoms -Educated on {CCM BP Counseling:25124} -Counseled to monitor BP at home ***, document, and provide log at future appointments -{CCMPHARMDINTERVENTION:25122}  Lab Results  Component Value Date   CHOL 147 05/01/2019   HDL 54 05/01/2019   LDLCALC 73 05/01/2019   TRIG 102 05/01/2019   CHOLHDL 2.7 05/01/2019     Hyperlipidemia: (LDL goal < 70) -Uncontrolled -Current treatment: Atorvastatin 80 mg 1/2 tablet daily -Medications previously tried: ***  -Current dietary patterns: *** -Current exercise habits: *** -Educated on {CCM HLD Counseling:25126} -{CCMPHARMDINTERVENTION:25122} Recommend repeat lipid panel. May need to increase statin to lower LDL to goal < 70.  CAD (Goal: ***) -{US controlled/uncontrolled:25276} -Current treatment  Aspirin 81 mg 1 tablet daily Atorvastatin 80 mg 1/2 tablet daily -Medications previously tried: ***  -{CCMPHARMDINTERVENTION:25122}   Atrial Fibrillation (Goal: prevent stroke and major bleeding) -{US  controlled/uncontrolled:25276} -CHADSVASC: 4 -Current treatment: Rhythm control: Amiodarone 200 mg 1/2 tablet daily Anticoagulation: Warfarin 4 mg daily -Medications previously tried: *** -Home BP and HR readings: ***  -Counseled on {CCMAFIBCOUNSELING:25120} -{CCMPHARMDINTERVENTION:25122}  Heart Failure (Goal: manage symptoms and prevent exacerbations) -{US controlled/uncontrolled:25276} -Last ejection fraction: 40-45% (Date: 05/01/20) -HF type: Left Ventricular Failure -NYHA Class: II (slight limitation of activity) -AHA HF Stage: B (Heart disease present - no symptoms present) -Current treatment: Losartan 100 mg 1 tablet daily Amiodarone 200 mg 1/2 tablet daily -Medications previously tried: ***  -Current home BP/HR readings: *** -Current dietary habits: *** -Current exercise habits: *** -Educated on {CCM HF Counseling:25125} -{CCMPHARMDINTERVENTION:25122} -discussed monitoring for fluid build up  Osteoporosis / Osteopenia (Goal ***) -{US controlled/uncontrolled:25276} -Last DEXA Scan: ***   T-Score femoral neck: ***  T-Score total hip: ***  T-Score lumbar spine: ***  T-Score forearm radius: ***  10-year probability of major osteoporotic fracture: ***  10-year probability of hip fracture: *** -Patient {is;is not an osteoporosis candidate:23886} -Current treatment   -Medications previously tried: ***  -{Osteoporosis Counseling:23892} -{CCMPHARMDINTERVENTION:25122}  GERD (Goal: ***) -{US controlled/uncontrolled:25276} -Current treatment  Tums as needed Pepcid 20 mg as needed Zantac 10 mg as needed -Medications previously tried: none  -{CCMPHARMDINTERVENTION:25122}  Allergic rhinitis (Goal: ***) -{US controlled/uncontrolled:25276} -Current treatment  Claritin 10 mg 1 tablet daily as needed - patient takes about 3 x month -Medications previously tried: ***  -{CCMPHARMDINTERVENTION:25122}  Pain (Goal: ***) -{US controlled/uncontrolled:25276} -Current treatment   Tylenol 500 mg daily (patient takes 2 x month) -Medications previously tried: ***  -{CCMPHARMDINTERVENTION:25122} Avoiding NSAIDs for pain while taking anticoagulants; maximum daily dose of Tylenol of 3,000 mg/day  Health Maintenance -Vaccine gaps: COVID booster -Current therapy:  Amoxicillin 500 mg 4 capsules Amoxicillin suspension Multivitamin 1 tablet daily Vitamin C 1000 mg 1 tablet daily -Educated on {ccm supplement counseling:25128} -{CCM Patient satisfied:25129} -{CCMPHARMDINTERVENTION:25122}  Patient Goals/Self-Care Activities Patient will:  - {pharmacypatientgoals:24919}  Follow Up Plan: {CM FOLLOW UP PQDI:26415}   Medication Assistance: {MEDASSISTANCEINFO:25044}  Compliance/Adherence/Medication fill history: Care Gaps: COVID booster  Star-Rating Drugs: Atorvastatin - 90 ds filled on 03/05/21 at [facility] Losartan - 90 ds filled on 03/18/21 at [facility]  Patient's preferred pharmacy is:  [place], [place] [address] [name] [address] Phone: [phone] Fax: [phone]  Uses pill box? Yes - 1 week at a time; morning, lunch, and supper (3 different pill boxes) Pt endorses 95% compliance - sometimes misses aspirin and vitamin C  We discussed: {Pharmacy options:24294} Patient decided to: {[facility] Pharmacy [facility]  Care Plan and Follow Up  Patient Decision:  {FOLLOWUP:24991}  Plan: {CM FOLLOW UP VWPV:94801}  [name], PharmD, [place] Pharmacist [place] [facility] [place] [phone]

## 2021-05-20 ENCOUNTER — Other Ambulatory Visit: Payer: Self-pay

## 2021-05-20 ENCOUNTER — Ambulatory Visit: Payer: Medicare Other

## 2021-05-20 ENCOUNTER — Ambulatory Visit (INDEPENDENT_AMBULATORY_CARE_PROVIDER_SITE_OTHER): Payer: Medicare Other | Admitting: General Practice

## 2021-05-20 DIAGNOSIS — Z952 Presence of prosthetic heart valve: Secondary | ICD-10-CM | POA: Diagnosis not present

## 2021-05-20 DIAGNOSIS — Z7901 Long term (current) use of anticoagulants: Secondary | ICD-10-CM

## 2021-05-20 DIAGNOSIS — I482 Chronic atrial fibrillation, unspecified: Secondary | ICD-10-CM

## 2021-05-20 LAB — POCT INR: INR: 1.9 — AB (ref 2.0–3.0)

## 2021-05-20 NOTE — Progress Notes (Signed)
Medical screening examination/treatment/procedure(s) were performed by non-physician practitioner and as supervising physician I was immediately available for consultation/collaboration. I agree with above. Milah Recht, MD   

## 2021-05-20 NOTE — Patient Instructions (Addendum)
Pre visit review using our clinic review tool, if applicable. No additional management support is needed unless otherwise documented below in the visit note.  Take 1 1/2 tablets today (6/16) and then continue to take 1 (4 mg) tablet daily.  Re-check in 4 to 5 weeks.

## 2021-05-27 ENCOUNTER — Telehealth: Payer: Self-pay | Admitting: Pharmacist

## 2021-05-27 NOTE — Chronic Care Management (AMB) (Signed)
Date- Patient called to remind of appointment with Watt Climes on 06.24.2022 at 1:00 pm.  Patient aware of appointment date, time, and type of appointment (telephone). Patient aware to have/bring all medications, supplements, blood pressure and/or blood sugar logs to visit.  Questions: Have you had any recent office visit or specialist visit outside of [place]? No Are there any concerns you would like to discuss during your office visit? No Are you having any problems obtaining your medications? (Whether it pharmacy issues or cost) No I Medication Dispensed  Quantity Pharmacy  Losartan 100 mg 04.14.2022 [facility]  Atorvastatin 80 mg 04.01.2022 [facility]    Any gaps in medications fill history?    Maia Breslow, [place] Pharmacist Assistant [phone]

## 2021-05-28 ENCOUNTER — Ambulatory Visit (INDEPENDENT_AMBULATORY_CARE_PROVIDER_SITE_OTHER): Payer: Medicare Other | Admitting: Pharmacist

## 2021-05-28 DIAGNOSIS — E785 Hyperlipidemia, unspecified: Secondary | ICD-10-CM | POA: Diagnosis not present

## 2021-05-28 DIAGNOSIS — I482 Chronic atrial fibrillation, unspecified: Secondary | ICD-10-CM

## 2021-05-28 DIAGNOSIS — I10 Essential (primary) hypertension: Secondary | ICD-10-CM | POA: Diagnosis not present

## 2021-05-28 NOTE — Progress Notes (Signed)
Chronic Care Management Pharmacy Note  05/28/2021 Name:  Jesus [name] MRN:  [id number] DOB:  1941-06-10  Summary: BP is at goal < 130/80 LDL is slightly above goal < 70  Recommendations/Changes made from today's visit: -Recommend repeat lipid panel and consider increasing statin dose to lower LDL to goal  Plan: Follow up in 6 months   Subjective: Jesus [name] is an 80 y.o. year old male who is a primary patient of [name], [name], MD.  The CCM team was consulted for assistance with disease management and care coordination needs.    Engaged with patient by telephone for follow up visit in response to provider referral for pharmacy case management and/or care coordination services.   Consent to Services:  The patient was given information about Chronic Care Management services, agreed to services, and gave verbal consent prior to initiation of services.  Please see initial visit note for detailed documentation.   Patient Care Team: [name], MD as PCP - General (Family Medicine) [name], MD as PCP - Cardiology (Cardiology) [name], [facility] as Pharmacist (Pharmacist)  Recent office visits: 12/31/20 Grier Mitts, MD: Patient presented for splinter in hand.   12/09/20 [name], MD: Patient presented for post ED visit and left hip pain. X-ray showed osteopenia.  Recent consult visits: 05/12/21 [name], MD (cardiology): Patient presented for CAD and Afib follow up. Plan for labs later this month with echo.  04/15/21 Meriam Sprague, RN ([place]): Patient presented for anti-coag visit. INR 2.6, goal 2-3. Continued 4 mg (4 mg x 1) every day.  Hospital visits: 12/02/20 Patient presented to the ED for rectal bleeding and multiple contusions.   Objective:  Lab Results  Component Value Date   CREATININE 0.95 12/02/2020   BUN 11 12/02/2020   GFR 104.41 01/21/2015   GFRNONAA >60 12/02/2020   GFRAA 90 11/04/2020   NA 139 12/02/2020    K 4.1 12/02/2020   CALCIUM 9.0 12/02/2020   CO2 25 12/02/2020   GLUCOSE 107 (H) 12/02/2020    Lab Results  Component Value Date/Time   HGBA1C 6.0 (H) 03/06/2015 02:45 PM   GFR 104.41 01/21/2015 08:27 AM   GFR 85.12 08/27/2014 01:35 PM    Last diabetic Eye exam: No results found for: HMDIABEYEEXA  Last diabetic Foot exam: No results found for: HMDIABFOOTEX   Lab Results  Component Value Date   CHOL 147 05/01/2019   HDL 54 05/01/2019   LDLCALC 73 05/01/2019   TRIG 102 05/01/2019   CHOLHDL 2.7 05/01/2019    Hepatic Function Latest Ref Rng & Units 12/02/2020 11/04/2020 05/01/2020  Total Protein 6.5 - 8.1 g/dL 6.8 6.1 6.3  Albumin 3.5 - 5.0 g/dL 3.8 4.0 3.9  AST 15 - 41 U/L '27 26 22  ' ALT 0 - 44 U/L '27 18 26  ' Alk Phosphatase 38 - 126 U/L 103 114 107  Total Bilirubin 0.3 - 1.2 mg/dL 1.0 0.5 0.5  Bilirubin, Direct 0.00 - 0.40 mg/dL - - -    Lab Results  Component Value Date/Time   TSH 2.740 11/04/2020 03:11 PM   TSH 2.520 05/01/2020 02:46 PM   FREET4 1.29 03/20/2017 02:41 PM   FREET4 0.9 10/19/2009 02:40 PM    CBC Latest Ref Rng & Units 12/02/2020 05/01/2020 09/03/2019  WBC 4.0 - 10.5 K/uL 7.4 9.6 6.2  Hemoglobin 13.0 - 17.0 g/dL 12.6(L) 12.6(L) 12.9(L)  Hematocrit 39.0 - 52.0 % 39.3 35.8(L) 38.6(L)  Platelets 150 - 400 K/uL 147(L)  160 133.0(L)    No results found for: VD25OH  Clinical ASCVD: Yes  The ASCVD Risk score Mikey Bussing DC Jr., et al., 2013) failed to calculate for the following reasons:   The 2013 ASCVD risk score is only valid for ages 56 to 64    Depression screen PHQ 2/9 01/29/2020 10/30/2017 03/21/2016  Decreased Interest 0 0 0  Down, Depressed, Hopeless 0 0 0  PHQ - 2 Score 0 0 0  Altered sleeping 0 - -  Tired, decreased energy 0 - -  Change in appetite 0 - -  Feeling bad or failure about yourself  0 - -  Trouble concentrating 0 - -  Moving slowly or fidgety/restless 0 - -  Suicidal thoughts 0 - -  PHQ-9 Score 0 - -  Difficult doing work/chores Not  difficult at all - -  Some recent data might be hidden     CHA2DS2/VAS Stroke Risk Points  Current as of 3 minutes ago     5 >= 2 Points: High Risk  1 - 1.99 Points: Medium Risk  0 Points: Low Risk    Last Change: N/A      Details    This score determines the patient's risk of having a stroke if the  patient has atrial fibrillation.       Points Metrics  1 Has Congestive Heart Failure:  Yes    Current as of 3 minutes ago  1 Has Vascular Disease:  Yes    Current as of 3 minutes ago  1 Has Hypertension:  Yes    Current as of 3 minutes ago  2 Age:  3    Current as of 3 minutes ago  0 Has Diabetes:  No    Current as of 3 minutes ago  0 Had Stroke:  No  Had TIA:  No  Had Thromboembolism:  No    Current as of 3 minutes ago  0 Male:  No    Current as of 3 minutes ago         Social History   Tobacco Use  Smoking Status Never  Smokeless Tobacco Never  Tobacco Comments   Does not smoke.   BP Readings from Last 3 Encounters:  05/12/21 (!) 130/58  12/31/20 130/60  12/09/20 112/60   Pulse Readings from Last 3 Encounters:  05/12/21 71  12/31/20 (!) 59  12/09/20 64   Wt Readings from Last 3 Encounters:  05/12/21 128 lb 3.2 oz (58.2 kg)  12/31/20 135 lb (61.2 kg)  12/09/20 135 lb (61.2 kg)   BMI Readings from Last 3 Encounters:  05/12/21 24.22 kg/m  12/31/20 25.51 kg/m  12/09/20 25.51 kg/m    Assessment/Interventions: Review of patient past medical history, allergies, medications, health status, including review of consultants reports, laboratory and other test data, was performed as part of comprehensive evaluation and provision of chronic care management services.   SDOH:  (Social Determinants of Health) assessments and interventions performed: No  SDOH Screenings   Alcohol Screen: Not on file  Depression (YYQ8-2): Not on file  Financial Resource Strain: Not on file  Food Insecurity: Not on file  Housing: Not on file  Physical Activity: Not on file   Social Connections: Not on file  Stress: Not on file  Tobacco Use: Low Risk    Smoking Tobacco Use: Never   Smokeless Tobacco Use: Never  Transportation Needs: Not on file    CCM Care Plan  Allergies  Allergen Reactions  Codeine Other (See Comments)    Pt does not remember   Iodine Swelling    Medications Reviewed Today     Reviewed by [name], MD (Physician) on 05/12/21 at 1034  Med List Status: <None>   Medication Order Taking? Sig Documenting Provider Last Dose Status Informant  acetaminophen (TYLENOL) 500 MG tablet [id number] Yes Take 500 mg by mouth every 6 (six) hours as needed for moderate pain. [provider] Taking Active Spouse/Significant Other  albuterol (VENTOLIN HFA) 108 (90 Base) MCG/ACT inhaler [id number] Yes INHALE 2 PUFFS BY MOUTH EVERY 4 HOURS ASNEEDED FOR WHEEZING OR SHORTNESS OF BREATH. [name], MD Taking Active   amiodarone (PACERONE) 200 MG tablet [id number] Yes TAKE 1/2 TABLET BY MOUTH DAILY [name], MD Taking Active   amLODipine (NORVASC) 5 MG tablet [id number] Yes TAKE 1 TABLET BY MOUTH ONCE DAILY [name], MD Taking Active   amoxicillin (AMOXIL) 400 MG/5ML suspension [id number] Yes TAKE 5 TEASPOONFULS BY MOUTH 1 HOUR PRIOR TO DENTAL PROCEDURE [name], [name], MD Taking Active   Ascorbic Acid (VITAMIN C) 1000 MG tablet [id number] Yes Take 1,000 mg by mouth daily. [provider] Taking Active Spouse/Significant Other  aspirin 81 MG tablet [id number] Yes Take 81 mg by mouth daily. [provider] Taking Active Spouse/Significant Other  atorvastatin (LIPITOR) 80 MG tablet [id number] Yes TAKE 1/2 TABLET BY MOUTH DAILY [name], [name], MD Taking Active   loratadine (CLARITIN) 10 MG tablet [id number] Yes Take 10 mg by mouth daily as needed for allergies. [provider] Taking Active Spouse/Significant Other  losartan (COZAAR) 100 MG tablet [id number] Yes TAKE 1 TABLET BY MOUTH ONCE DAILY [name],  [name], MD Taking Active   Multiple Vitamins-Minerals (MULTIVITAMIN,TX-MINERALS) tablet [id number] Yes Take 1 tablet by mouth daily. [provider] Taking Active Spouse/Significant Other  warfarin (COUMADIN) 4 MG tablet [id number] Yes TAKE 1 TABLET BY MOUTH DAILY OR TAKE AS DIRECTED BY ANTICOAGULATION CLINIC [place], [name], MD Taking Active             Patient Active Problem List   Diagnosis Date Noted   Chronic combined systolic and diastolic CHF (congestive heart failure) ([place]) 04/16/2018   Long term (current) use of anticoagulants 08/25/2017   Actinic keratoses 03/21/2017   Internal hemorrhoid, bleeding 07/01/2015   Rectal bleeding 06/15/2015   Internal hemorrhoids 06/15/2015   Other constipation 06/15/2015   Chronic anticoagulation-Couamdin 03/12/2015   S/P TAVR (transcatheter aortic valve replacement) 03/10/2015   Anemia 09/03/2014   Encounter for therapeutic drug monitoring 12/31/2013   Yellow jacket sting 08/06/2013   Low back pain on right side with sciatica 04/24/2013   Severe aortic stenosis 08/10/2012   CAD (coronary artery disease) 08/10/2012   Left ventricular dysfunction 08/10/2012   Routine health maintenance 07/12/2012   BEE STING REACTION, LOCAL 09/21/2010   BEN LOC HYPERPLASIA PROS W/UR OBST & OTH LUTS 08/02/2010   Acute and chronic cholecystitis 02/01/2010   Abdominal pain, generalized 01/29/2010   CORONARY ATHEROSCLEROSIS NATIVE CORONARY ARTERY 10/13/2009   HYPERTHYROIDISM 05/22/2009   INSOMNIA 05/22/2009   Hyperlipidemia 04/22/2009   Cardiomyopathy, ischemic-EF 35% 04/22/2009   CAD, AUTOLOGOUS BYPASS GRAFT 10/16/2008   Essential hypertension 09/07/2007   Aortic valve disorder 09/07/2007   Chronic atrial fibrillation ([place]) 09/07/2007   GASTROESOPHAGEAL REFLUX DISEASE 09/07/2007   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    Immunization History  Administered Date(s) Administered   Fluad Quad(high Dose 65+) 10/04/2019, 10/08/2020   Influenza Whole  09/05/2001, 09/26/2008, 09/26/2009, 09/14/2010  Influenza, High Dose Seasonal PF 08/12/2015, 09/02/2016, 08/25/2017, 09/07/2018   Influenza,inj,Quad PF,6+ Mos 08/27/2014   Influenza-Unspecified 09/12/2012, 08/16/2013   PFIZER(Purple Top)SARS-COV-2 Vaccination 01/06/2020, 01/20/2020, 11/23/2020   Pneumococcal Conjugate-13 04/07/2014   Pneumococcal Polysaccharide-23 09/05/2001, 01/14/2010   Td 01/06/1999   Tetanus 05/15/2013   Zoster Recombinat (Shingrix) 09/06/2019, 02/19/2020   Zoster, Live 12/14/2011   Patient is having a hard time given his wife recently passed away and his brother passed away the week before her and he also had a neighbor recently pass. He does report he has a support system with his brother's wife and other family and continues to work 5 days a week.   Conditions to be addressed/monitored:  Hypertension, Hyperlipidemia, Atrial Fibrillation, Heart Failure, GERD, Osteopenia, Allergic Rhinitis, and Pain  Care Plan : [place]  Updates made by [name], [place] since 05/28/2021 12:00 AM     Problem: Problem: Hypertension, Hyperlipidemia, Atrial Fibrillation, Heart Failure, GERD, Osteopenia, Allergic Rhinitis, and Pain      Long-Range Goal: Patient Stated   Start Date: 05/28/2021  Expected End Date: 05/28/2022  This Visit's Progress: On track  Priority: High  Note:   Current Barriers:  Unable to independently monitor therapeutic efficacy  Pharmacist Clinical Goal(s):  Patient will achieve adherence to monitoring guidelines and medication adherence to achieve therapeutic efficacy through collaboration with PharmD and provider.   Interventions: 1:1 collaboration with [name], MD regarding development and update of comprehensive plan of care as evidenced by provider attestation and co-signature Inter-disciplinary care team collaboration (see longitudinal plan of care) Comprehensive medication review performed; medication list updated in  electronic medical record  Hypertension (BP goal <130/80) -Controlled -Current treatment: Amlodipine 5 mg 1 tablet daily - in AM Losartan 100 mg 1 tablet daily - in AM -Medications previously tried: none  -Current home readings: does not check but has an arm cuff -Current dietary habits: did not discuss -Current exercise habits: did not discuss -Denies hypotensive/hypertensive symptoms -Educated on BP goals and benefits of medications for prevention of heart attack, stroke and kidney damage; Importance of home blood pressure monitoring; Proper BP monitoring technique; -Counseled to monitor BP at home weekly, document, and provide log at future appointments -Counseled on diet and exercise extensively Recommended to continue current medication  Hyperlipidemia: (LDL goal < 70) -Not ideally controlled -Current treatment: Atorvastatin 80 mg 1/2 tablet daily -Medications previously tried: none  -Current dietary patterns: did not discuss -Current exercise habits: did not discuss -Educated on Cholesterol goals;  Benefits of statin for ASCVD risk reduction; Importance of limiting foods high in cholesterol; -Counseled on diet and exercise extensively Recommended to continue current medication Recommended repeat lipid panel and may consider dose increase to lower LDL to goal < 70.  CAD (Goal: prevent heart events) -Controlled -Current treatment  Aspirin 81 mg 1 tablet daily Atorvastatin 80 mg 1/2 tablet daily -Medications previously tried: none  -Recommended to continue current medication   Atrial Fibrillation (Goal: prevent stroke and major bleeding) -Controlled -CHADSVASC: 4 -Current treatment: Rhythm control: Amiodarone 200 mg 1/2 tablet daily Anticoagulation: Warfarin 4 mg daily -Medications previously tried: none -Home BP and HR readings: does not check  -Counseled on increased risk of stroke due to Afib and benefits of anticoagulation for stroke prevention; importance of  adherence to anticoagulant exactly as prescribed; -Counseled on diet and exercise extensively Recommended to continue current medication Educated on foods that can affect INR  Heart Failure (Goal: manage symptoms and prevent exacerbations) -Controlled -Last ejection  fraction: 40-45% (Date: 05/01/20) -HF type: Left Ventricular Failure -NYHA Class: II (slight limitation of activity) -AHA HF Stage: B (Heart disease present - no symptoms present) -Current treatment: Losartan 100 mg 1 tablet daily Amiodarone 200 mg 1/2 tablet daily -Medications previously tried: none  -Current home BP/HR readings: does not check -Current dietary habits: did not discuss -Current exercise habits: did not discuss -Educated on Benefits of medications for managing symptoms and prolonging life Importance of blood pressure control -Counseled on diet and exercise extensively Recommended to continue current medication  Osteopenia (Goal prevent fractures) -Not ideally controlled -Last DEXA Scan: has not completed (noted on Xray)   T-Score femoral neck: n/a  T-Score total hip: n/a  T-Score lumbar spine: n/a  T-Score forearm radius: n/a  10-year probability of major osteoporotic fracture: n/a  10-year probability of hip fracture: n/a -Patient is not a candidate for pharmacologic treatment -Current treatment  No medications -Medications previously tried: none  -Recommend [id number] units of vitamin D daily. Recommend 1200 mg of calcium daily from dietary and supplemental sources. Recommend weight-bearing and muscle strengthening exercises for building and maintaining bone density. - Consider DEXA scan  GERD (Goal: minimize symptoms) -Controlled -Current treatment  Tums as needed Pepcid 20 mg as needed Zantac 10 mg as needed -Medications previously tried: none  - Patient alternates Pepcid and Zantac  Allergic rhinitis (Goal: minimize symptoms) -Controlled -Current treatment  Claritin 10 mg 1 tablet daily  as needed - patient takes about 3 x month -Medications previously tried: none  -Recommended to continue current medication  Pain (Goal: minimize pain) -Controlled -Current treatment  Tylenol 500 mg daily (patient takes 2 x month) -Medications previously tried: none  -Counseled on avoiding NSAIDs for pain while taking anticoagulants; maximum daily dose of Tylenol of 3,000 mg/day   Health Maintenance -Vaccine gaps: COVID booster -Current therapy:  Amoxicillin 500 mg 4 capsules Amoxicillin suspension Multivitamin 1 tablet daily Vitamin C 1000 mg 1 tablet daily -Educated on Cost vs benefit of each product must be carefully weighed by individual consumer -Patient is satisfied with current therapy and denies issues -Recommended to continue current medication  Patient Goals/Self-Care Activities Patient will:  - take medications as prescribed check blood pressure weekly, document, and provide at future appointments  Follow Up Plan: Telephone follow up appointment with care management team member scheduled for: 6 months       Medication Assistance: None required.  Patient affirms current coverage meets needs.  Compliance/Adherence/Medication fill history: Care Gaps: COVID booster  Star-Rating Drugs: Atorvastatin - 90 ds filled on 03/05/21 at [facility] Losartan - 90 ds filled on 03/18/21 at [facility]  Patient's preferred pharmacy is:  [facility], [name] [address] Phone: [phone] Fax: [phone]  Uses pill box? Yes - 1 week at a time; morning, lunch, and supper (3 different pill boxes) Pt endorses 95% compliance - sometimes misses aspirin and vitamin C  We discussed: Current pharmacy is preferred with insurance plan and patient is satisfied with pharmacy services Patient decided to: Continue current medication management strategy  Care Plan and Follow Up Patient Decision:  Patient agrees to Care  Plan and Follow-up.  Plan: Telephone follow up appointment with care management team member scheduled for:  6 months  [name], PharmD, [place] Pharmacist [place] at [place] [phone]

## 2021-05-31 DIAGNOSIS — H903 Sensorineural hearing loss, bilateral: Secondary | ICD-10-CM | POA: Diagnosis not present

## 2021-06-01 ENCOUNTER — Other Ambulatory Visit: Payer: Self-pay | Admitting: Family Medicine

## 2021-06-02 ENCOUNTER — Ambulatory Visit (HOSPITAL_COMMUNITY): Payer: Medicare Other | Attending: Internal Medicine

## 2021-06-02 ENCOUNTER — Other Ambulatory Visit: Payer: Medicare Other | Admitting: *Deleted

## 2021-06-02 ENCOUNTER — Other Ambulatory Visit: Payer: Self-pay

## 2021-06-02 DIAGNOSIS — E782 Mixed hyperlipidemia: Secondary | ICD-10-CM

## 2021-06-02 DIAGNOSIS — I480 Paroxysmal atrial fibrillation: Secondary | ICD-10-CM

## 2021-06-02 DIAGNOSIS — I2510 Atherosclerotic heart disease of native coronary artery without angina pectoris: Secondary | ICD-10-CM | POA: Diagnosis not present

## 2021-06-02 DIAGNOSIS — I5042 Chronic combined systolic (congestive) and diastolic (congestive) heart failure: Secondary | ICD-10-CM

## 2021-06-02 DIAGNOSIS — I359 Nonrheumatic aortic valve disorder, unspecified: Secondary | ICD-10-CM | POA: Insufficient documentation

## 2021-06-02 LAB — LIPID PANEL
Chol/HDL Ratio: 2.8 ratio (ref 0.0–5.0)
Cholesterol, Total: 141 mg/dL (ref 100–199)
HDL: 51 mg/dL (ref >39)
LDL Chol Calc (NIH): 71 mg/dL (ref 0–99)
Triglycerides: 101 mg/dL (ref 0–149)
VLDL Cholesterol Cal: 19 mg/dL (ref 5–40)

## 2021-06-02 LAB — CBC WITH DIFFERENTIAL/PLATELET
Basophils Absolute: 0 10*3/uL (ref 0.0–0.2)
Basos: 1 %
EOS (ABSOLUTE): 0.1 10*3/uL (ref 0.0–0.4)
Eos: 1 %
Hematocrit: 37.3 % — ABNORMAL LOW (ref 37.5–51.0)
Hemoglobin: 12.7 g/dL — ABNORMAL LOW (ref 13.0–17.7)
Immature Grans (Abs): 0 10*3/uL (ref 0.0–0.1)
Immature Granulocytes: 0 %
Lymphocytes Absolute: 1.5 10*3/uL (ref 0.7–3.1)
Lymphs: 21 %
MCH: 33.4 pg — ABNORMAL HIGH (ref 26.6–33.0)
MCHC: 34 g/dL (ref 31.5–35.7)
MCV: 98 fL — ABNORMAL HIGH (ref 79–97)
Monocytes Absolute: 0.7 10*3/uL (ref 0.1–0.9)
Monocytes: 10 %
Neutrophils Absolute: 4.6 10*3/uL (ref 1.4–7.0)
Neutrophils: 67 %
Platelets: 135 10*3/uL — ABNORMAL LOW (ref 150–450)
RBC: 3.8 x10E6/uL — ABNORMAL LOW (ref 4.14–5.80)
RDW: 12.6 % (ref 11.6–15.4)
WBC: 6.9 10*3/uL (ref 3.4–10.8)

## 2021-06-02 LAB — COMPREHENSIVE METABOLIC PANEL
ALT: 22 IU/L (ref 0–44)
AST: 25 IU/L (ref 0–40)
Albumin/Globulin Ratio: 1.8 (ref 1.2–2.2)
Albumin: 4.3 g/dL (ref 3.6–4.6)
Alkaline Phosphatase: 122 IU/L — ABNORMAL HIGH (ref 44–121)
BUN/Creatinine Ratio: 13 (ref 10–24)
BUN: 13 mg/dL (ref 8–27)
Bilirubin Total: 0.5 mg/dL (ref 0.0–1.2)
CO2: 24 mmol/L (ref 20–29)
Calcium: 9 mg/dL (ref 8.6–10.2)
Chloride: 104 mmol/L (ref 96–106)
Creatinine, Ser: 1.03 mg/dL (ref 0.76–1.27)
Globulin, Total: 2.4 g/dL (ref 1.5–4.5)
Glucose: 101 mg/dL — ABNORMAL HIGH (ref 65–99)
Potassium: 4.4 mmol/L (ref 3.5–5.2)
Sodium: 140 mmol/L (ref 134–144)
Total Protein: 6.7 g/dL (ref 6.0–8.5)
eGFR: 73 mL/min/{1.73_m2} (ref >59)

## 2021-06-02 LAB — ECHOCARDIOGRAM COMPLETE
AR max vel: 0.91 cm2
AV Area VTI: 0.89 cm2
AV Area mean vel: 0.83 cm2
AV Mean grad: 15 mmHg
AV Peak grad: 28.9 mmHg
Ao pk vel: 2.69 m/s
Area-P 1/2: 2.36 cm2
P 1/2 time: 564 msec
S' Lateral: 3.5 cm

## 2021-06-02 LAB — TSH: TSH: 3.27 u[IU]/mL (ref 0.450–4.500)

## 2021-06-08 ENCOUNTER — Telehealth: Payer: Self-pay | Admitting: Cardiovascular Disease

## 2021-06-08 NOTE — Telephone Encounter (Signed)
Pt is returning call for his results for his recent Echo. Please advise pt further

## 2021-06-08 NOTE — Telephone Encounter (Signed)
Left a message for patient to call back to review results.

## 2021-06-08 NOTE — Telephone Encounter (Signed)
Pt is returning a call  

## 2021-06-08 NOTE — Telephone Encounter (Signed)
Called pt to review Echo results, phone line is ringing busy.

## 2021-06-08 NOTE — Telephone Encounter (Signed)
Returned called reviewed results with patient.

## 2021-06-17 ENCOUNTER — Ambulatory Visit: Payer: Medicare Other

## 2021-06-17 ENCOUNTER — Other Ambulatory Visit: Payer: Self-pay | Admitting: Family Medicine

## 2021-06-17 ENCOUNTER — Telehealth: Payer: Medicare Other

## 2021-06-21 ENCOUNTER — Ambulatory Visit (INDEPENDENT_AMBULATORY_CARE_PROVIDER_SITE_OTHER): Payer: Medicare Other | Admitting: General Practice

## 2021-06-21 ENCOUNTER — Other Ambulatory Visit: Payer: Self-pay

## 2021-06-21 ENCOUNTER — Ambulatory Visit (INDEPENDENT_AMBULATORY_CARE_PROVIDER_SITE_OTHER): Payer: Medicare Other

## 2021-06-21 VITALS — BP 138/68 | HR 61 | Temp 98.0°F | Ht 60.0 in | Wt 129.0 lb

## 2021-06-21 DIAGNOSIS — Z0000 Encounter for general adult medical examination without abnormal findings: Secondary | ICD-10-CM

## 2021-06-21 DIAGNOSIS — Z952 Presence of prosthetic heart valve: Secondary | ICD-10-CM

## 2021-06-21 DIAGNOSIS — I482 Chronic atrial fibrillation, unspecified: Secondary | ICD-10-CM

## 2021-06-21 DIAGNOSIS — Z7901 Long term (current) use of anticoagulants: Secondary | ICD-10-CM

## 2021-06-21 LAB — POCT INR: INR: 3.2 — AB (ref 2.0–3.0)

## 2021-06-21 NOTE — Progress Notes (Signed)
Subjective:   Jesus [name] is a 80 y.o. male who presents for an subsequent Medicare Annual Wellness Visit.  Review of Systems    N/a       Objective:    There were no vitals filed for this visit. There is no height or weight on file to calculate BMI.  Advanced Directives 05/05/2016 03/11/2015 03/06/2015 02/25/2015 01/26/2015 08/02/2012  Does Patient Have a Medical Advance Directive? No No No No No Patient does not have advance directive  Would patient like information on creating a medical advance directive? No - patient declined information No - patient declined information - Yes Higher education careers adviser given Yes - Scientist, clinical (histocompatibility and immunogenetics) given -  Pre-existing out of facility DNR order (yellow form or pink MOST form) - - - - - No    Current Medications (verified) Outpatient Encounter Medications as of 06/21/2021  Medication Sig   acetaminophen (TYLENOL) 500 MG tablet Take 500 mg by mouth every 6 (six) hours as needed for moderate pain.   albuterol (VENTOLIN HFA) 108 (90 Base) MCG/ACT inhaler INHALE 2 PUFFS BY MOUTH EVERY 4 HOURS ASNEEDED FOR WHEEZING OR SHORTNESS OF BREATH.   amiodarone (PACERONE) 200 MG tablet TAKE 1/2 TABLET BY MOUTH DAILY   amLODipine (NORVASC) 5 MG tablet TAKE 1 TABLET BY MOUTH ONCE DAILY   amoxicillin (AMOXIL) 400 MG/5ML suspension TAKE 5 TEASPOONFULS BY MOUTH 1 HOUR PRIOR TO DENTAL PROCEDURE   Ascorbic Acid (VITAMIN C) 1000 MG tablet Take 1,000 mg by mouth daily.   aspirin 81 MG tablet Take 81 mg by mouth daily.   atorvastatin (LIPITOR) 80 MG tablet TAKE 1/2 TABLET BY MOUTH DAILY   loratadine (CLARITIN) 10 MG tablet Take 10 mg by mouth daily as needed for allergies.   losartan (COZAAR) 100 MG tablet TAKE 1 TABLET BY MOUTH EVERY DAY   Multiple Vitamins-Minerals (MULTIVITAMIN,TX-MINERALS) tablet Take 1 tablet by mouth daily.   warfarin (COUMADIN) 4 MG tablet TAKE 1 TABLET BY MOUTH DAILY OR TAKE AS DIRECTED BY ANTICOAGULATION CLINIC   No facility-administered  encounter medications on file as of 06/21/2021.    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 S/P Ant MI 1980;  b. 1997 S/P CABG x 8 (LIMA to diag-LAD, SVG to OM1-OM2-OM3, SVG to [facility] - Dr [name]);  c. 01/2015 Cath: 3VD, 8/8 patent grafts.   Cardiomyopathy    Dysrhythmia    Esophageal reflux    GERD (gastroesophageal reflux disease)    Headache    History of colonoscopy    History of transesophageal echocardiography (TEE) for monitoring    Other and unspecified hyperlipidemia    S/P TAVR (transcatheter aortic valve replacement)    a. 03/2015 26 mm Edwards Sapien 3 transcatheter heart valve placed via open left transfemoral approach.   Skin lesions, generalized    facial which may represent actinic keratoses and possible photosensitivity from Amiodarone   Unspecified essential hypertension    Past Surgical History:  Procedure Laterality Date   CARDIAC CATHETERIZATION     CARDIOVERSION  11/18/2006   Dr. [name]   CATARACT EXTRACTION W/ INTRAOCULAR LENS IMPLANT  April '13  (Dr. [name])   left eye only  CHOLECYSTECTOMY     CORONARY ARTERY BYPASS GRAFT  02/09/1996   LIMA to diag-LAD, SVG to OM1-OM2-OM3, SVG to [facility]   EYE SURGERY     LEFT AND RIGHT HEART CATHETERIZATION WITH CORONARY/GRAFT ANGIOGRAM N/A 01/26/2015   Procedure: LEFT AND RIGHT HEART CATHETERIZATION WITH [name];  Surgeon: [name], MD;  Location: [facility] CATH LAB;  Service: Cardiovascular;  Laterality: N/A;   TEE WITHOUT CARDIOVERSION N/A 03/10/2015   Procedure: TRANSESOPHAGEAL ECHOCARDIOGRAM (TEE);  Surgeon: [name], MD;  Location: [place];  Service: Open Heart Surgery;  Laterality: N/A;    TONSILLECTOMY     TRANSCATHETER AORTIC VALVE REPLACEMENT, TRANSFEMORAL N/A 03/10/2015   Procedure: TRANSCATHETER AORTIC VALVE REPLACEMENT, TRANSFEMORAL;  Surgeon: [name], MD;  Location: [place];  Service: Open Heart Surgery;  Laterality: N/A;   Family History  Problem Relation Age of Onset   Stroke Mother 85       Deceased   Crohn's disease Mother        Deceased   Heart attack Father 51       Deceased   Heart disease Father    Heart failure Father        Deceased   Atrial fibrillation Brother    Heart disease Paternal Uncle    Prostate cancer Paternal Uncle    Colon cancer Neg Hx    Social History   Socioeconomic History   Marital status: Married    Spouse name: Not on file   Number of children: Not on file   Years of education: Not on file   Highest education level: Not on file  Occupational History   Not on file  Tobacco Use   Smoking status: Never   Smokeless tobacco: Never   Tobacco comments:    Does not smoke.  Vaping Use   Vaping Use: Never used  Substance and Sexual Activity   Alcohol use: No    Alcohol/week: 0.0 standard drinks   Drug use: No   Sexual activity: Not Currently  Other Topics Concern   Not on file  Social History Narrative   HSG. [employer] 6 years. Married - '69 - 1 year/divorced. '76 - . No children. Work - mfg/textiles - Designer, multimedia; currently works doing maintenance. [employer] - they have discussed this. Provided packet august '13.                Social Determinants of Health   Financial Resource Strain: Not on file  Food Insecurity: Not on file  Transportation Needs: Not on file  Physical Activity: Not on file  Stress: Not on file  Social Connections: Not on file    Tobacco Counseling Counseling given: Not Answered Tobacco comments: Does not smoke.   Clinical Intake:                 Diabetic?no         Activities of Daily Living No flowsheet data found.  Patient Care Team: [name], MD as PCP - General (Family Medicine) [name], MD as PCP - Cardiology (Cardiology) [name], [facility] as Pharmacist (Pharmacist)  Indicate any recent Medical Services you may have received from other than [facility] providers in the past year (date may be approximate).     Assessment:   This is a routine wellness examination for Jesus [name].  Hearing/Vision screen No results found.  Dietary issues and exercise activities discussed:     Goals Addressed   None    Depression Screen  PHQ 2/9 Scores 01/29/2020 10/30/2017 03/21/2016 09/30/2015 09/03/2014 09/03/2014  PHQ - 2 Score 0 0 0 0 0 0  PHQ- 9 Score 0 - - - - -    Fall Risk Fall Risk  01/29/2020 10/30/2017 03/21/2016 09/30/2015 09/03/2014  Falls in the past year? 1 No No No No  Number falls in past yr: 0 - - - -  Injury with Fall? 1 - - - -    FALL RISK PREVENTION PERTAINING TO THE HOME:  Any stairs in or around the home? Yes  If so, are there any without handrails? No  Home free of loose throw rugs in walkways, pet beds, electrical cords, etc? Yes  Adequate lighting in your home to reduce risk of falls? No   ASSISTIVE DEVICES UTILIZED TO PREVENT FALLS:  Life alert? No  Use of a cane, walker or w/c? Yes  Grab bars in the bathroom? Yes  Shower chair or bench in shower? Yes  Elevated toilet seat or a handicapped toilet? No   TIMED UP AND GO:  Was the test performed? Yes .  Length of time to ambulate 10 feet: 12 sec.   Gait slow and steady without use of assistive device  Cognitive Function:        Immunizations Immunization History  Administered Date(s) Administered   Fluad Quad(high Dose 65+) 10/04/2019, 10/08/2020   Influenza Whole 09/05/2001, 09/26/2008, 09/26/2009, 09/14/2010   Influenza, High Dose Seasonal PF 08/12/2015, 09/02/2016, 08/25/2017, 09/07/2018   Influenza,inj,Quad PF,6+ Mos 08/27/2014   Influenza-Unspecified 09/12/2012, 08/16/2013   PFIZER(Purple Top)SARS-COV-2 Vaccination 01/06/2020,  01/20/2020, 11/23/2020   Pneumococcal Conjugate-13 04/07/2014   Pneumococcal Polysaccharide-23 09/05/2001, 01/14/2010   Td 01/06/1999   Tetanus 05/15/2013   Zoster Recombinat (Shingrix) 09/06/2019, 02/19/2020   Zoster, Live 12/14/2011    TDAP status: Up to date  Flu Vaccine status: Up to date  Pneumococcal vaccine status: Up to date  Covid-19 vaccine status: Completed vaccines  Qualifies for Shingles Vaccine? Yes   Zostavax completed No   Shingrix Completed?: No.    Education has been provided regarding the importance of this vaccine. Patient has been advised to call insurance company to determine out of pocket expense if they have not yet received this vaccine. Advised may also receive vaccine at local pharmacy or Health Dept. Verbalized acceptance and understanding.  Screening Tests Health Maintenance  Topic Date Due   COVID-19 Vaccine (4 - Booster) 02/21/2021   INFLUENZA VACCINE  07/05/2021   TETANUS/TDAP  08/19/2026   PNA vac Low Risk Adult  Completed   Zoster Vaccines- Shingrix  Completed   HPV VACCINES  Aged Out    Health Maintenance  Health Maintenance Due  Topic Date Due   COVID-19 Vaccine (4 - Booster) 02/21/2021    Colorectal cancer screening: No longer required.   Lung Cancer Screening: (Low Dose CT Chest recommended if Age 21-80 years, 30 pack-year currently smoking OR have quit w/in 15years.) does not qualify.   Lung Cancer Screening Referral: n/a  Additional Screening:  Hepatitis C Screening: does not qualify  Vision Screening: Recommended annual ophthalmology exams for early detection of glaucoma and other disorders of the eye. Is the patient up to date with their annual eye exam?  Yes  Who is the provider or what is the name of the office in which the patient attends annual eye exams? [facility]  If pt is not established with a provider, would they like to be referred to a provider to establish care? No .  Dental Screening:  Recommended annual dental exams for proper oral hygiene  Community Resource Referral / Chronic Care Management: CRR required this visit?  No   CCM required this visit?  No      Plan:     I have personally reviewed and noted the following in the patient's chart:   Medical and social history Use of alcohol, tobacco or illicit drugs  Current medications and supplements including opioid prescriptions. Patient is not currently taking opioid prescriptions. Functional ability and status Nutritional status Physical activity Advanced directives List of other physicians Hospitalizations, surgeries, and ER visits in previous 12 months Vitals Screenings to include cognitive, depression, and falls Referrals and appointments  In addition, I have reviewed and discussed with patient certain preventive protocols, quality metrics, and best practice recommendations. A written personalized care plan for preventive services as well as general preventive health recommendations were provided to patient.     Randel Pigg, LPN   1/61/0960   Nurse Notes: none

## 2021-06-21 NOTE — Patient Instructions (Signed)
Jesus [name] , Thank you for taking time to come for your Medicare Wellness Visit. I appreciate your ongoing commitment to your health goals. Please review the following plan we discussed and let me know if I can assist you in the future.   Screening recommendations/referrals: Colonoscopy: no longer required  Recommended yearly ophthalmology/optometry visit for glaucoma screening and checkup Recommended yearly dental visit for hygiene and checkup  Vaccinations: Influenza vaccine: due in fall 2022 Pneumococcal vaccine: completed series  Tdap vaccine: 05/15/2013  due 2024 Shingles vaccine: completed series     Advanced directives: will provide copies   Conditions/risks identified: none   Next appointment: none   Preventive Care 82 Years and Older, Male Preventive care refers to lifestyle choices and visits with your health care provider that can promote health and wellness. What does preventive care include? A yearly physical exam. This is also called an annual well check. Dental exams once or twice a year. Routine eye exams. Ask your health care provider how often you should have your eyes checked. Personal lifestyle choices, including: Daily care of your teeth and gums. Regular physical activity. Eating a healthy diet. Avoiding tobacco and drug use. Limiting alcohol use. Practicing safe sex. Taking low doses of aspirin every day. Taking vitamin and mineral supplements as recommended by your health care provider. What happens during an annual well check? The services and screenings done by your health care provider during your annual well check will depend on your age, overall health, lifestyle risk factors, and family history of disease. Counseling  Your health care provider may ask you questions about your: Alcohol use. Tobacco use. Drug use. Emotional well-being. Home and relationship well-being. Sexual activity. Eating habits. History of falls. Memory and ability to  understand (cognition). Work and work Statistician. Screening  You may have the following tests or measurements: Height, weight, and BMI. Blood pressure. Lipid and cholesterol levels. These may be checked every 5 years, or more frequently if you are over 40 years old. Skin check. Lung cancer screening. You may have this screening every year starting at age 82 if you have a 30-pack-year history of smoking and currently smoke or have quit within the past 15 years. Fecal occult blood test (FOBT) of the stool. You may have this test every year starting at age 82. Flexible sigmoidoscopy or colonoscopy. You may have a sigmoidoscopy every 5 years or a colonoscopy every 10 years starting at age 38. Prostate cancer screening. Recommendations will vary depending on your family history and other risks. Hepatitis C blood test. Hepatitis B blood test. Sexually transmitted disease (STD) testing. Diabetes screening. This is done by checking your blood sugar (glucose) after you have not eaten for a while (fasting). You may have this done every 1-3 years. Abdominal aortic aneurysm (AAA) screening. You may need this if you are a current or former smoker. Osteoporosis. You may be screened starting at age 72 if you are at high risk. Talk with your health care provider about your test results, treatment options, and if necessary, the need for more tests. Vaccines  Your health care provider may recommend certain vaccines, such as: Influenza vaccine. This is recommended every year. Tetanus, diphtheria, and acellular pertussis (Tdap, Td) vaccine. You may need a Td booster every 10 years. Zoster vaccine. You may need this after age 67. Pneumococcal 13-valent conjugate (PCV13) vaccine. One dose is recommended after age 36. Pneumococcal polysaccharide (PPSV23) vaccine. One dose is recommended after age 5. Talk to your health care provider  about which screenings and vaccines you need and how often you need them. This  information is not intended to replace advice given to you by your health care provider. Make sure you discuss any questions you have with your health care provider. Document Released: 12/18/2015 Document Revised: 08/10/2016 Document Reviewed: 09/22/2015 Elsevier Interactive Patient Education  2017 [employer] Prevention in the Home Falls can cause injuries. They can happen to people of all ages. There are many things you can do to make your home safe and to help prevent falls. What can I do on the outside of my home? Regularly fix the edges of walkways and driveways and fix any cracks. Remove anything that might make you trip as you walk through a door, such as a raised step or threshold. Trim any bushes or trees on the path to your home. Use bright outdoor lighting. Clear any walking paths of anything that might make someone trip, such as rocks or tools. Regularly check to see if handrails are loose or broken. Make sure that both sides of any steps have handrails. Any raised decks and porches should have guardrails on the edges. Have any leaves, snow, or ice cleared regularly. Use sand or salt on walking paths during winter. Clean up any spills in your garage right away. This includes oil or grease spills. What can I do in the bathroom? Use night lights. Install grab bars by the toilet and in the tub and shower. Do not use towel bars as grab bars. Use non-skid mats or decals in the tub or shower. If you need to sit down in the shower, use a plastic, non-slip stool. Keep the floor dry. Clean up any water that spills on the floor as soon as it happens. Remove soap buildup in the tub or shower regularly. Attach bath mats securely with double-sided non-slip rug tape. Do not have throw rugs and other things on the floor that can make you trip. What can I do in the bedroom? Use night lights. Make sure that you have a light by your bed that is easy to reach. Do not use any sheets or  blankets that are too big for your bed. They should not hang down onto the floor. Have a firm chair that has side arms. You can use this for support while you get dressed. Do not have throw rugs and other things on the floor that can make you trip. What can I do in the kitchen? Clean up any spills right away. Avoid walking on wet floors. Keep items that you use a lot in easy-to-reach places. If you need to reach something above you, use a strong step stool that has a grab bar. Keep electrical cords out of the way. Do not use floor polish or wax that makes floors slippery. If you must use wax, use non-skid floor wax. Do not have throw rugs and other things on the floor that can make you trip. What can I do with my stairs? Do not leave any items on the stairs. Make sure that there are handrails on both sides of the stairs and use them. Fix handrails that are broken or loose. Make sure that handrails are as long as the stairways. Check any carpeting to make sure that it is firmly attached to the stairs. Fix any carpet that is loose or worn. Avoid having throw rugs at the top or bottom of the stairs. If you do have throw rugs, attach them to the floor  with carpet tape. Make sure that you have a light switch at the top of the stairs and the bottom of the stairs. If you do not have them, ask someone to add them for you. What else can I do to help prevent falls? Wear shoes that: Do not have high heels. Have rubber bottoms. Are comfortable and fit you well. Are closed at the toe. Do not wear sandals. If you use a stepladder: Make sure that it is fully opened. Do not climb a closed stepladder. Make sure that both sides of the stepladder are locked into place. Ask someone to hold it for you, if possible. Clearly mark and make sure that you can see: Any grab bars or handrails. First and last steps. Where the edge of each step is. Use tools that help you move around (mobility aids) if they are  needed. These include: Canes. Walkers. Scooters. Crutches. Turn on the lights when you go into a dark area. Replace any light bulbs as soon as they burn out. Set up your furniture so you have a clear path. Avoid moving your furniture around. If any of your floors are uneven, fix them. If there are any pets around you, be aware of where they are. Review your medicines with your doctor. Some medicines can make you feel dizzy. This can increase your chance of falling. Ask your doctor what other things that you can do to help prevent falls. This information is not intended to replace advice given to you by your health care provider. Make sure you discuss any questions you have with your health care provider. Document Released: 09/17/2009 Document Revised: 04/28/2016 Document Reviewed: 12/26/2014 Elsevier Interactive Patient Education  2017 [employer].

## 2021-06-21 NOTE — Patient Instructions (Addendum)
Pre visit review using our clinic review tool, if applicable. No additional management support is needed unless otherwise documented below in the visit note.  Skip dosage today tomorrow (7/19) and then continue to take 1 (4 mg) tablet daily.  Re-check in 4 weeks.

## 2021-06-22 ENCOUNTER — Ambulatory Visit: Payer: Medicare Other

## 2021-06-24 ENCOUNTER — Telehealth: Payer: Self-pay | Admitting: Family Medicine

## 2021-06-24 NOTE — Telephone Encounter (Signed)
Patient wants losartan (COZAAR) 100 MG tablet sent back to   [place], [place] Phone:  [phone]  Fax:  [phone]

## 2021-06-25 MED ORDER — LOSARTAN POTASSIUM 100 MG PO TABS: 100.0000 mg | ORAL_TABLET | Freq: Every day | ORAL | 1 refills | Status: DC

## 2021-06-25 NOTE — Telephone Encounter (Signed)
Rx has been sent to the requested pharmacy.

## 2021-07-05 ENCOUNTER — Ambulatory Visit (INDEPENDENT_AMBULATORY_CARE_PROVIDER_SITE_OTHER): Payer: Medicare Other | Admitting: Family Medicine

## 2021-07-05 ENCOUNTER — Other Ambulatory Visit: Payer: Self-pay

## 2021-07-05 ENCOUNTER — Encounter: Payer: Self-pay | Admitting: Family Medicine

## 2021-07-05 VITALS — BP 118/62 | HR 70 | Temp 98.8°F | Wt 129.4 lb

## 2021-07-05 DIAGNOSIS — J40 Bronchitis, not specified as acute or chronic: Secondary | ICD-10-CM

## 2021-07-05 MED ORDER — AZITHROMYCIN 250 MG PO TABS: ORAL_TABLET | ORAL | 0 refills | Status: DC

## 2021-07-05 MED ORDER — HYDROCODONE BIT-HOMATROP MBR 5-1.5 MG/5ML PO SOLN: 5.0000 mL | ORAL | 0 refills | Status: DC | PRN

## 2021-07-05 NOTE — Progress Notes (Signed)
   Subjective:    Patient ID: Jesus [name], male    DOB: 1941/03/03, 80 y.o.   MRN: [id number]  HPI Here for 3 days of chest congestion and a dry cough. No fever or headache or ST. No chest pain or SOB. Robitussin does not help.    Review of Systems  Constitutional: Negative.   HENT: Negative.    Eyes: Negative.   Respiratory:  Positive for cough. Negative for shortness of breath and wheezing.   Cardiovascular: Negative.       Objective:   Physical Exam Constitutional:      Appearance: Normal appearance. He is not ill-appearing.  HENT:     Right Ear: Tympanic membrane, ear canal and external ear normal.     Left Ear: Tympanic membrane, ear canal and external ear normal.     Nose: Nose normal.     Mouth/Throat:     Pharynx: Oropharynx is clear.  Eyes:     Conjunctiva/sclera: Conjunctivae normal.  Cardiovascular:     Rate and Rhythm: Normal rate and regular rhythm.     Pulses: Normal pulses.     Heart sounds: Normal heart sounds.  Pulmonary:     Effort: Pulmonary effort is normal. No respiratory distress.     Breath sounds: No wheezing or rales.     Comments: Scattered rhonchi  Lymphadenopathy:     Cervical: No cervical adenopathy.  Neurological:     Mental Status: He is alert.          Assessment & Plan:  Bronchitis, treat with a Zpack.  [name], MD

## 2021-07-08 ENCOUNTER — Other Ambulatory Visit: Payer: Self-pay | Admitting: Family Medicine

## 2021-07-12 ENCOUNTER — Other Ambulatory Visit: Payer: Self-pay

## 2021-07-12 ENCOUNTER — Encounter: Payer: Self-pay | Admitting: Family Medicine

## 2021-07-12 ENCOUNTER — Ambulatory Visit (INDEPENDENT_AMBULATORY_CARE_PROVIDER_SITE_OTHER): Payer: Medicare Other | Admitting: Family Medicine

## 2021-07-12 ENCOUNTER — Ambulatory Visit: Payer: Self-pay

## 2021-07-12 ENCOUNTER — Ambulatory Visit (INDEPENDENT_AMBULATORY_CARE_PROVIDER_SITE_OTHER): Payer: Medicare Other | Admitting: General Practice

## 2021-07-12 VITALS — BP 130/50 | HR 65 | Temp 98.4°F | Wt 126.2 lb

## 2021-07-12 DIAGNOSIS — R0982 Postnasal drip: Secondary | ICD-10-CM | POA: Diagnosis not present

## 2021-07-12 DIAGNOSIS — I482 Chronic atrial fibrillation, unspecified: Secondary | ICD-10-CM

## 2021-07-12 DIAGNOSIS — Z952 Presence of prosthetic heart valve: Secondary | ICD-10-CM

## 2021-07-12 DIAGNOSIS — R059 Cough, unspecified: Secondary | ICD-10-CM

## 2021-07-12 DIAGNOSIS — Z7901 Long term (current) use of anticoagulants: Secondary | ICD-10-CM

## 2021-07-12 LAB — POCT INR: INR: 2.8 (ref 2.0–3.0)

## 2021-07-12 NOTE — Progress Notes (Signed)
Established Patient Office Visit  Subjective:  Patient ID: Jesus [name], male    DOB: 05/22/1941  Age: 80 y.o. MRN: [id number]  CC:  Chief Complaint  Patient presents with   Nasal Congestion    drainage    HPI Jesus [name] presents for recent upper respiratory symptoms of cough and postnasal drip.  He was seen here last week and prescribed Zithromax and Hycodan cough syrup which he has taken occasionally at night.  Cough is relatively mild.  No fever.  Still has some postnasal drip.  He tried some over-the-counter chlorpheniramine in combination with dextromethorphan.  Generally feels well.  Denies any dyspnea.  No chest pains.  No dizziness.  No known sick contacts.  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 S/P Ant MI 1980;  b. 1997 S/P CABG x 8 (LIMA to diag-LAD, SVG to OM1-OM2-OM3, SVG to [facility] - Dr [name]);  c. 01/2015 Cath: 3VD, 8/8 patent grafts.   Cardiomyopathy    Dysrhythmia    Esophageal reflux    GERD (gastroesophageal reflux disease)    Headache    History of colonoscopy    History of transesophageal echocardiography (TEE) for monitoring    Other and unspecified hyperlipidemia    S/P TAVR (transcatheter aortic valve replacement)    a. 03/2015 26 mm Edwards Sapien 3 transcatheter heart valve placed via open left transfemoral approach.   Skin lesions, generalized    facial which may represent actinic keratoses and possible photosensitivity from Amiodarone   Unspecified essential hypertension     Past Surgical History:  Procedure Laterality Date   CARDIAC CATHETERIZATION     CARDIOVERSION  11/18/2006   Dr. [name]   CATARACT EXTRACTION W/ INTRAOCULAR LENS IMPLANT  April '13  (Dr. [name])   left eye only    CHOLECYSTECTOMY     CORONARY ARTERY BYPASS GRAFT  02/09/1996   LIMA to diag-LAD, SVG to OM1-OM2-OM3, SVG to [facility]   EYE SURGERY     LEFT AND RIGHT HEART CATHETERIZATION WITH CORONARY/GRAFT ANGIOGRAM N/A 01/26/2015   Procedure: LEFT AND RIGHT HEART CATHETERIZATION WITH [name];  Surgeon: [name], MD;  Location: [facility] CATH LAB;  Service: Cardiovascular;  Laterality: N/A;   TEE WITHOUT CARDIOVERSION N/A 03/10/2015   Procedure: TRANSESOPHAGEAL ECHOCARDIOGRAM (TEE);  Surgeon: [name], MD;  Location: [place];  Service: Open Heart Surgery;  Laterality: N/A;   TONSILLECTOMY     TRANSCATHETER AORTIC VALVE REPLACEMENT, TRANSFEMORAL N/A 03/10/2015   Procedure: TRANSCATHETER AORTIC VALVE REPLACEMENT, TRANSFEMORAL;  Surgeon: [name], MD;  Location: [place];  Service: Open Heart Surgery;  Laterality: N/A;    Family History  Problem Relation Age of Onset   Stroke Mother 38       Deceased   Crohn's disease Mother        Deceased   Heart attack Father 19       Deceased   Heart disease Father    Heart failure Father        Deceased   Atrial fibrillation Brother    Heart disease Paternal Uncle    Prostate cancer Paternal Uncle    Colon cancer Neg Hx     Social History   Socioeconomic History  Marital status: Married    Spouse name: Not on file   Number of children: Not on file   Years of education: Not on file   Highest education level: Not on file  Occupational History   Not on file  Tobacco Use   Smoking status: Never   Smokeless tobacco: Never   Tobacco comments:    Does not smoke.  Vaping Use   Vaping Use: Never used  Substance and Sexual Activity   Alcohol use: No    Alcohol/week: 0.0 standard drinks   Drug use: No   Sexual activity: Not Currently  Other Topics Concern   Not on file  Social History Narrative   HSG. [place] 6 years. Married - '69 - 1 year/divorced. '76 - . No children. Work - mfg/textiles - Designer, multimedia;  currently works doing maintenance. [employer] - they have discussed this. Provided packet august '13.                Social Determinants of Health   Financial Resource Strain: Low Risk    Difficulty of Paying Living Expenses: Not hard at all  Food Insecurity: No Food Insecurity   Worried About Charity fundraiser in the Last Year: Never true   [place] in the Last Year: Never true  Transportation Needs: No Transportation Needs   Lack of Transportation (Medical): No   Lack of Transportation (Non-Medical): No  Physical Activity: Sufficiently Active   Days of Exercise per Week: 5 days   Minutes of Exercise per Session: 60 min  Stress: No Stress Concern Present   Feeling of Stress : Not at all  Social Connections: Moderately Integrated   Frequency of Communication with Friends and Family: Three times a week   Frequency of Social Gatherings with Friends and Family: Three times a week   Attends Religious Services: More than 4 times per year   Active Member of Clubs or Organizations: Yes   Attends Archivist Meetings: More than 4 times per year   Marital Status: Widowed  Human resources officer Violence: Not At Risk   Fear of Current or Ex-Partner: No   Emotionally Abused: No   Physically Abused: No   Sexually Abused: No    Outpatient Medications Prior to Visit  Medication Sig Dispense Refill   acetaminophen (TYLENOL) 500 MG tablet Take 500 mg by mouth every 6 (six) hours as needed for moderate pain.     albuterol (VENTOLIN HFA) 108 (90 Base) MCG/ACT inhaler INHALE 2 PUFFS BY MOUTH EVERY 4 HOURS ASNEEDED FOR WHEEZING OR SHORTNESS OF BREATH. 6.7 g 1   amiodarone (PACERONE) 200 MG tablet TAKE 1/2 TABLET BY MOUTH DAILY 45 tablet 3   amLODipine (NORVASC) 5 MG tablet TAKE 1 TABLET BY MOUTH ONCE DAILY 90 tablet 3   amoxicillin (AMOXIL) 400 MG/5ML suspension TAKE 5 TEASPOONFULS BY MOUTH 1 HOUR PRIOR TO DENTAL PROCEDURE 100 mL 0   Ascorbic Acid (VITAMIN C) 1000 MG tablet Take 1,000 mg  by mouth daily.     aspirin 81 MG tablet Take 81 mg by mouth daily.     atorvastatin (LIPITOR) 80 MG tablet TAKE 1/2 TABLET BY MOUTH DAILY 90 tablet 1   azithromycin (ZITHROMAX Z-PAK) 250 MG tablet As directed 6 each 0   HYDROcodone bit-homatropine (HYCODAN) 5-1.5 MG/5ML syrup Take 5 mLs by mouth every 4 (four) hours as needed. 120 mL 0   loratadine (CLARITIN) 10 MG tablet Take 10 mg by mouth daily as  needed for allergies.     losartan (COZAAR) 100 MG tablet Take 1 tablet (100 mg total) by mouth daily. 90 tablet 1   Multiple Vitamins-Minerals (MULTIVITAMIN,TX-MINERALS) tablet Take 1 tablet by mouth daily.     warfarin (COUMADIN) 4 MG tablet TAKE 1 TABLET BY MOUTH DAILY OR TAKE AS DIRECTED BY ANTICOAGULATION CLINIC 105 tablet 1   No facility-administered medications prior to visit.    Allergies  Allergen Reactions   Codeine Other (See Comments)    Pt does not remember   Iodine Swelling    ROS Review of Systems  Constitutional:  Negative for chills and fever.  HENT:  Positive for postnasal drip.   Respiratory:  Positive for cough. Negative for shortness of breath and wheezing.      Objective:    Physical Exam Vitals reviewed.  Constitutional:      Appearance: Normal appearance.  Cardiovascular:     Rate and Rhythm: Normal rate.  Pulmonary:     Effort: Pulmonary effort is normal.     Comments: Does have some coarse rhonchi both lower lung fields. Musculoskeletal:     Right lower leg: No edema.     Left lower leg: No edema.  Neurological:     Mental Status: He is alert.    BP (!) 130/50 (BP Location: Left Arm, Patient Position: Sitting, Cuff Size: Normal)   Pulse 65   Temp 98.4 F (36.9 C) (Oral)   Wt 126 lb 3.2 oz (57.2 kg)   SpO2 99%   BMI 24.65 kg/m  Wt Readings from Last 3 Encounters:  07/12/21 126 lb 3.2 oz (57.2 kg)  07/05/21 129 lb 6 oz (58.7 kg)  06/21/21 129 lb (58.5 kg)     Health Maintenance Due  Topic Date Due   COVID-19 Vaccine (4 - Booster)  02/21/2021   INFLUENZA VACCINE  07/05/2021    There are no preventive care reminders to display for this patient.  Lab Results  Component Value Date   TSH 3.270 06/02/2021   Lab Results  Component Value Date   WBC 6.9 06/02/2021   HGB 12.7 (L) 06/02/2021   HCT 37.3 (L) 06/02/2021   MCV 98 (H) 06/02/2021   PLT 135 (L) 06/02/2021   Lab Results  Component Value Date   NA 140 06/02/2021   K 4.4 06/02/2021   CO2 24 06/02/2021   GLUCOSE 101 (H) 06/02/2021   BUN 13 06/02/2021   CREATININE 1.03 06/02/2021   BILITOT 0.5 06/02/2021   ALKPHOS 122 (H) 06/02/2021   AST 25 06/02/2021   ALT 22 06/02/2021   PROT 6.7 06/02/2021   ALBUMIN 4.3 06/02/2021   CALCIUM 9.0 06/02/2021   ANIONGAP 8 12/02/2020   EGFR 73 06/02/2021   GFR 104.41 01/21/2015   Lab Results  Component Value Date   CHOL 141 06/02/2021   Lab Results  Component Value Date   HDL 51 06/02/2021   Lab Results  Component Value Date   LDLCALC 71 06/02/2021   Lab Results  Component Value Date   TRIG 101 06/02/2021   Lab Results  Component Value Date   CHOLHDL 2.8 06/02/2021   Lab Results  Component Value Date   HGBA1C 6.0 (H) 03/06/2015      Assessment & Plan:   Patient presents with some persistent cough and postnasal drip symptoms.  No respiratory distress.  No fever.  Overall, slowly improving  -Cautioned about chlorpheniramine and avoidance of anticholinergic medication such as Benadryl -Continue stable hydrated well and consider  over-the-counter plain Mucinex -He has home incentive spirometer and suggest that he use that several times daily -Follow for now and follow-up immediately for any fever or if symptoms not resolving in terms of cough resolution next 2 weeks    Follow-up: No follow-ups on file.    [name], MD

## 2021-07-12 NOTE — Patient Instructions (Signed)
Follow up immediately for any fever or increased shortness of breath.    Also let me know if cough not resolving in 2 weeks.     Use incentive spirometer at home several times daily.    Could try some plain Mucinex twice daily to loosen up mucus.

## 2021-07-12 NOTE — Patient Instructions (Signed)
Pre visit review using our clinic review tool, if applicable. No additional management support is needed unless otherwise documented below in the visit note. 

## 2021-07-13 ENCOUNTER — Other Ambulatory Visit: Payer: Self-pay | Admitting: Family Medicine

## 2021-07-13 DIAGNOSIS — Z7901 Long term (current) use of anticoagulants: Secondary | ICD-10-CM

## 2021-07-14 NOTE — Telephone Encounter (Signed)
Pt compliant with coumadin management. Last coumadin clinic apt and PCP apt was 07/12/21. Next coumadin clinic apt is 08/12/21. Sent in script

## 2021-07-16 NOTE — Progress Notes (Signed)
A user error has taken place: encounter opened in error, closed for administrative reasons.

## 2021-07-20 ENCOUNTER — Ambulatory Visit: Payer: Medicare Other

## 2021-08-12 ENCOUNTER — Other Ambulatory Visit: Payer: Self-pay

## 2021-08-12 ENCOUNTER — Ambulatory Visit (INDEPENDENT_AMBULATORY_CARE_PROVIDER_SITE_OTHER): Payer: Medicare Other

## 2021-08-12 DIAGNOSIS — Z7901 Long term (current) use of anticoagulants: Secondary | ICD-10-CM | POA: Diagnosis not present

## 2021-08-12 LAB — POCT INR: INR: 2.7 (ref 2.0–3.0)

## 2021-08-12 NOTE — Progress Notes (Signed)
Medical screening examination/treatment/procedure(s) were performed by non-physician practitioner and as supervising physician I was immediately available for consultation/collaboration. I agree with above. Sofija Antwi, MD   

## 2021-08-12 NOTE — Patient Instructions (Addendum)
Pre visit review using our clinic review tool, if applicable. No additional management support is needed unless otherwise documented below in the visit note.  Continue to take 1 (4 mg) tablet daily.  Re-check in 5 weeks.

## 2021-08-16 ENCOUNTER — Ambulatory Visit: Payer: Medicare Other

## 2021-08-16 DIAGNOSIS — Z23 Encounter for immunization: Secondary | ICD-10-CM | POA: Diagnosis not present

## 2021-08-25 ENCOUNTER — Telehealth: Payer: Self-pay | Admitting: Pharmacist

## 2021-08-25 NOTE — Chronic Care Management (AMB) (Signed)
Chronic Care Management Pharmacy Assistant   Name: Jesus [name]  MRN: [id number] DOB: 1941/01/03   Reason for Encounter: Disease State/ Hypertension Assessment Call.   Conditions to be addressed/monitored: HTN   Recent office visits:  08/12/21 [name] RN [facility] Medicine) - seen for anticoagulation office visit.   07/12/21 [name] MD (PCP) - seen for cough. Discontinued azithromycin and hydrocodone. Follow up as needed.   07/05/21 [name] MD (PCP) - seen for bronchitis. Patient started on azithromycin and hydrocodone. No follow up noted.   Recent consult visits:  None.  Hospital visits:  None in previous 6 months  Medications: Outpatient Encounter Medications as of 08/25/2021  Medication Sig   acetaminophen (TYLENOL) 500 MG tablet Take 500 mg by mouth every 6 (six) hours as needed for moderate pain.   albuterol (VENTOLIN HFA) 108 (90 Base) MCG/ACT inhaler INHALE 2 PUFFS BY MOUTH EVERY 4 HOURS ASNEEDED FOR WHEEZING OR SHORTNESS OF BREATH.   amiodarone (PACERONE) 200 MG tablet TAKE 1/2 TABLET BY MOUTH DAILY   amLODipine (NORVASC) 5 MG tablet TAKE 1 TABLET BY MOUTH ONCE DAILY   amoxicillin (AMOXIL) 400 MG/5ML suspension TAKE 5 TEASPOONFULS BY MOUTH 1 HOUR PRIOR TO DENTAL PROCEDURE   Ascorbic Acid (VITAMIN C) 1000 MG tablet Take 1,000 mg by mouth daily.   aspirin 81 MG tablet Take 81 mg by mouth daily.   atorvastatin (LIPITOR) 80 MG tablet TAKE 1/2 TABLET BY MOUTH DAILY   loratadine (CLARITIN) 10 MG tablet Take 10 mg by mouth daily as needed for allergies.   losartan (COZAAR) 100 MG tablet Take 1 tablet (100 mg total) by mouth daily.   Multiple Vitamins-Minerals (MULTIVITAMIN,TX-MINERALS) tablet Take 1 tablet by mouth daily.   warfarin (COUMADIN) 4 MG tablet TAKE 1 TABLET BY MOUTH DAILY OR TAKE AS DIRECTED BY ANTICOAGULATION CLINIC   No facility-administered encounter medications on file as of 08/25/2021.   Fill History: ALBUTEROL SULFATE HFA  108 (90 Base)  MCG/ACT AERS 01/27/2021 30   AMIODARONE HYDROCHLORIDE  200 MG TABS 05/29/2021 90   ATORVASTATIN CALCIUM  80 MG TABS 06/01/2021 90   LOSARTAN POTASSIUM 100 MG TAB 06/17/2021 90   WARFARIN SODIUM  4 MG TABS 07/14/2021 90   AMLODIPINE BESYLATE  5 MG TABS 05/29/2021 90   Reviewed chart prior to disease state call. Spoke with patient regarding BP  Recent Office Vitals: BP Readings from Last 3 Encounters:  07/12/21 (!) 130/50  07/05/21 118/62  06/21/21 138/68   Pulse Readings from Last 3 Encounters:  07/12/21 65  07/05/21 70  06/21/21 61    Wt Readings from Last 3 Encounters:  07/12/21 126 lb 3.2 oz (57.2 kg)  07/05/21 129 lb 6 oz (58.7 kg)  06/21/21 129 lb (58.5 kg)     Kidney Function Lab Results  Component Value Date/Time   CREATININE 1.03 06/02/2021 07:32 AM   CREATININE 0.95 12/02/2020 10:23 AM   CREATININE 0.91 09/12/2016 01:11 PM   CREATININE 0.88 03/11/2016 07:34 AM   GFR 104.41 01/21/2015 08:27 AM   GFRNONAA >60 12/02/2020 10:23 AM   GFRAA 90 11/04/2020 03:11 PM    BMP Latest Ref Rng & Units 06/02/2021 12/02/2020 11/04/2020  Glucose 65 - 99 mg/dL 101(H) 107(H) 113(H)  BUN 8 - 27 mg/dL 13 11 15   Creatinine 0.76 - 1.27 mg/dL 1.03 0.95 0.92  BUN/Creat Ratio 10 - 24 13 - 16  Sodium 134 - 144 mmol/L 140 139 139  Potassium 3.5 - 5.2 mmol/L 4.4 4.1  4.2  Chloride 96 - 106 mmol/L 104 106 105  CO2 20 - 29 mmol/L 24 25 23   Calcium 8.6 - 10.2 mg/dL 9.0 9.0 8.8    Current antihypertensive regimen:  Amlodipine 5mg  - take 1 tablet by mouth once daily. Losartan 100mg  - take 1 tablet by mouth daily.  How often are you checking your Blood Pressure? Has not been checking at home. Current home BP readings: None to report. What recent interventions/DTPs have been made by any provider to improve Blood Pressure control since last CPP Visit: None. Any recent hospitalizations or ED visits since last visit with CPP? No  Adherence Review: Is the patient currently on ACE/ARB  medication? Yes Does the patient have >5 day gap between last estimated fill dates? No  Notes: Spoke with patient and reviewed all medications as prescribed. Patient reports no issues or changes with medications. Patient does not add salt to his food but he does eat what he wants. Patients wife passed away and he has not been checking his blood pressure at home or keeping a log. Patient does have a cuff at home and he will start back checking weekly and writing readings down. Patient still works from 7:30am to 12:30pm 5 days a week. Patient stated he gets plenty of activity working and doing yard work. Patient thanked me for my call.   Care Gaps:  AWV - completed on 06/21/21 COVID-19 vaccine booster 4  - overdue since 02/15/21 Flu vaccine - due Last BP Reading with PCP: 130/50 P: 65 on 07/12/21  Star Rating Drugs:  Atorvastatin 80mg  - last filled on 06/01/21 90DS at [facility] Losartan 100mg  - last filled on 06/17/21 90DS at [facility] Pharmacist Assistant [phone]

## 2021-08-26 NOTE — Telephone Encounter (Cosign Needed)
2nd attempt

## 2021-08-31 NOTE — Telephone Encounter (Cosign Needed)
3rd attempt

## 2021-09-08 ENCOUNTER — Telehealth: Payer: Self-pay | Admitting: Family Medicine

## 2021-09-08 NOTE — Telephone Encounter (Signed)
Pt returning call to office

## 2021-09-08 NOTE — Telephone Encounter (Signed)
It looks like you have been trying to reach this patient.

## 2021-09-10 ENCOUNTER — Ambulatory Visit (INDEPENDENT_AMBULATORY_CARE_PROVIDER_SITE_OTHER): Payer: Medicare Other | Admitting: Family Medicine

## 2021-09-10 ENCOUNTER — Other Ambulatory Visit: Payer: Self-pay

## 2021-09-10 VITALS — BP 124/50 | HR 65 | Temp 98.0°F | Wt 130.0 lb

## 2021-09-10 DIAGNOSIS — R233 Spontaneous ecchymoses: Secondary | ICD-10-CM | POA: Diagnosis not present

## 2021-09-10 NOTE — Progress Notes (Signed)
Established Patient Office Visit  Subjective:  Patient ID: Jesus [name], male    DOB: 1941-02-25  Age: 80 y.o. MRN: [id number]  CC:  Chief Complaint  Patient presents with   Hypertension    Pt was concerned his BP has been high, he brought his machine and it is not accurate     HPI Jesus [name] presents for concern for some recent increased bruising especially on his trunk.  He had a couple recent on upper thighs and is noted several on his abdominal region.  He does stay fairly active with things like yard work but denies any recent fall or injury.  No associated pain.  He is on Coumadin.  Last INR was at goal.  He has history of slightly low platelet count but usually 130-140,000 range.  He has not noted any recent bleeding from the gums or any nosebleeds or hematuria.  He is still going to the gym exercising 5 days/week and no problems with exercise tolerance.  No recent petechiae.  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 S/P Ant MI 1980;  b. 1997 S/P CABG x 8 (LIMA to diag-LAD, SVG to OM1-OM2-OM3, SVG to [facility] - Dr [name]);  c. 01/2015 Cath: 3VD, 8/8 patent grafts.   Cardiomyopathy    Dysrhythmia    Esophageal reflux    GERD (gastroesophageal reflux disease)    Headache    History of colonoscopy    History of transesophageal echocardiography (TEE) for monitoring    Other and unspecified hyperlipidemia    S/P TAVR (transcatheter aortic valve replacement)    a. 03/2015 26 mm Edwards Sapien 3 transcatheter heart valve placed via open left transfemoral approach.   Skin lesions, generalized    facial which may represent actinic keratoses and possible photosensitivity from Amiodarone   Unspecified essential hypertension     Past Surgical  History:  Procedure Laterality Date   CARDIAC CATHETERIZATION     CARDIOVERSION  11/18/2006   Dr. [name]   CATARACT EXTRACTION W/ INTRAOCULAR LENS IMPLANT  April '13  (Dr. [name])   left eye only   CHOLECYSTECTOMY     CORONARY ARTERY BYPASS GRAFT  02/09/1996   LIMA to diag-LAD, SVG to OM1-OM2-OM3, SVG to [facility]   EYE SURGERY     LEFT AND RIGHT HEART CATHETERIZATION WITH CORONARY/GRAFT ANGIOGRAM N/A 01/26/2015   Procedure: LEFT AND RIGHT HEART CATHETERIZATION WITH [name];  Surgeon: [name], MD;  Location: [facility] CATH LAB;  Service: Cardiovascular;  Laterality: N/A;   TEE WITHOUT CARDIOVERSION N/A 03/10/2015   Procedure: TRANSESOPHAGEAL ECHOCARDIOGRAM (TEE);  Surgeon: [name], MD;  Location: [place];  Service: Open Heart Surgery;  Laterality: N/A;   TONSILLECTOMY     TRANSCATHETER AORTIC VALVE REPLACEMENT, TRANSFEMORAL N/A 03/10/2015   Procedure: TRANSCATHETER AORTIC VALVE REPLACEMENT, TRANSFEMORAL;  Surgeon: [name], MD;  Location: [place];  Service: Open Heart Surgery;  Laterality: N/A;    Family History  Problem Relation Age of Onset   Stroke Mother 43       Deceased   Crohn's disease Mother        Deceased   Heart attack Father 66       Deceased   Heart  disease Father    Heart failure Father        Deceased   Atrial fibrillation Brother    Heart disease Paternal Uncle    Prostate cancer Paternal Uncle    Colon cancer Neg Hx     Social History   Socioeconomic History   Marital status: Married    Spouse name: Not on file   Number of children: Not on file   Years of education: Not on file   Highest education level: Not on file  Occupational History   Not on file  Tobacco Use   Smoking status: Never   Smokeless tobacco: Never   Tobacco comments:    Does not smoke.  Vaping Use   Vaping Use: Never used  Substance and Sexual Activity   Alcohol use: No    Alcohol/week: 0.0 standard drinks   Drug use: No   Sexual activity: Not  Currently  Other Topics Concern   Not on file  Social History Narrative   HSG. [employer] 6 years. Married - '69 - 1 year/divorced. '76 - . No children. Work - mfg/textiles - Designer, multimedia; currently works doing maintenance. [employer] - they have discussed this. Provided packet august '13.                Social Determinants of Health   Financial Resource Strain: Low Risk    Difficulty of Paying Living Expenses: Not hard at all  Food Insecurity: No Food Insecurity   Worried About Charity fundraiser in the Last Year: Never true   [place] in the Last Year: Never true  Transportation Needs: No Transportation Needs   Lack of Transportation (Medical): No   Lack of Transportation (Non-Medical): No  Physical Activity: Sufficiently Active   Days of Exercise per Week: 5 days   Minutes of Exercise per Session: 60 min  Stress: No Stress Concern Present   Feeling of Stress : Not at all  Social Connections: Moderately Integrated   Frequency of Communication with Friends and Family: Three times a week   Frequency of Social Gatherings with Friends and Family: Three times a week   Attends Religious Services: More than 4 times per year   Active Member of Clubs or Organizations: Yes   Attends Archivist Meetings: More than 4 times per year   Marital Status: Widowed  Human resources officer Violence: Not At Risk   Fear of Current or Ex-Partner: No   Emotionally Abused: No   Physically Abused: No   Sexually Abused: No    Outpatient Medications Prior to Visit  Medication Sig Dispense Refill   acetaminophen (TYLENOL) 500 MG tablet Take 500 mg by mouth every 6 (six) hours as needed for moderate pain.     albuterol (VENTOLIN HFA) 108 (90 Base) MCG/ACT inhaler INHALE 2 PUFFS BY MOUTH EVERY 4 HOURS ASNEEDED FOR WHEEZING OR SHORTNESS OF BREATH. 6.7 g 1   amiodarone (PACERONE) 200 MG tablet TAKE 1/2 TABLET BY MOUTH DAILY 45 tablet 3   amLODipine (NORVASC) 5 MG tablet TAKE 1 TABLET  BY MOUTH ONCE DAILY 90 tablet 3   amoxicillin (AMOXIL) 400 MG/5ML suspension TAKE 5 TEASPOONFULS BY MOUTH 1 HOUR PRIOR TO DENTAL PROCEDURE 100 mL 0   Ascorbic Acid (VITAMIN C) 1000 MG tablet Take 1,000 mg by mouth daily.     aspirin 81 MG tablet Take 81 mg by mouth daily.     atorvastatin (LIPITOR) 80 MG tablet TAKE 1/2 TABLET BY  MOUTH DAILY 90 tablet 1   loratadine (CLARITIN) 10 MG tablet Take 10 mg by mouth daily as needed for allergies.     losartan (COZAAR) 100 MG tablet Take 1 tablet (100 mg total) by mouth daily. 90 tablet 1   Multiple Vitamins-Minerals (MULTIVITAMIN,TX-MINERALS) tablet Take 1 tablet by mouth daily.     warfarin (COUMADIN) 4 MG tablet TAKE 1 TABLET BY MOUTH DAILY OR TAKE AS DIRECTED BY ANTICOAGULATION CLINIC 105 tablet 1   No facility-administered medications prior to visit.    Allergies  Allergen Reactions   Codeine Other (See Comments)    Pt does not remember   Iodine Swelling    ROS Review of Systems  Constitutional:  Negative for chills and fever.  HENT:  Negative for nosebleeds.   Respiratory:  Negative for shortness of breath.   Cardiovascular:  Negative for chest pain.  Genitourinary:  Negative for hematuria.  Hematological:  Negative for adenopathy. Bruises/bleeds easily.     Objective:    Physical Exam Vitals reviewed.  Constitutional:      Appearance: Normal appearance.  Cardiovascular:     Rate and Rhythm: Normal rate.  Pulmonary:     Effort: Pulmonary effort is normal.     Breath sounds: Normal breath sounds.  Skin:    Comments: He does have several small bruises about 2 to 3 cm diameter especially lower and mid abdomen region.  He has 1 left flank region.  Some scattered on the forearms bilaterally.  Neurological:     Mental Status: He is alert.    BP (!) 124/50 (BP Location: Right Arm, Cuff Size: Normal)   Pulse 65   Temp 98 F (36.7 C) (Oral)   Wt 130 lb (59 kg)   SpO2 98%   BMI 25.39 kg/m  Wt Readings from Last 3  Encounters:  09/10/21 130 lb (59 kg)  07/12/21 126 lb 3.2 oz (57.2 kg)  07/05/21 129 lb 6 oz (58.7 kg)     Health Maintenance Due  Topic Date Due   COVID-19 Vaccine (4 - Booster) 02/15/2021    There are no preventive care reminders to display for this patient.  Lab Results  Component Value Date   TSH 3.270 06/02/2021   Lab Results  Component Value Date   WBC 6.9 06/02/2021   HGB 12.7 (L) 06/02/2021   HCT 37.3 (L) 06/02/2021   MCV 98 (H) 06/02/2021   PLT 135 (L) 06/02/2021   Lab Results  Component Value Date   NA 140 06/02/2021   K 4.4 06/02/2021   CO2 24 06/02/2021   GLUCOSE 101 (H) 06/02/2021   BUN 13 06/02/2021   CREATININE 1.03 06/02/2021   BILITOT 0.5 06/02/2021   ALKPHOS 122 (H) 06/02/2021   AST 25 06/02/2021   ALT 22 06/02/2021   PROT 6.7 06/02/2021   ALBUMIN 4.3 06/02/2021   CALCIUM 9.0 06/02/2021   ANIONGAP 8 12/02/2020   EGFR 73 06/02/2021   GFR 104.41 01/21/2015   Lab Results  Component Value Date   CHOL 141 06/02/2021   Lab Results  Component Value Date   HDL 51 06/02/2021   Lab Results  Component Value Date   LDLCALC 71 06/02/2021   Lab Results  Component Value Date   TRIG 101 06/02/2021   Lab Results  Component Value Date   CHOLHDL 2.8 06/02/2021   Lab Results  Component Value Date   HGBA1C 6.0 (H) 03/06/2015      Assessment & Plan:   Easy bruising.  Patient is on Coumadin.  He has history of mild thrombocytopenia.  Denies any recent injury.  -Recheck CBC and INR -Continue close follow-up with Coumadin clinic as scheduled   No orders of the defined types were placed in this encounter.   Follow-up: No follow-ups on file.    [name], MD

## 2021-09-11 LAB — CBC WITH DIFFERENTIAL/PLATELET
Absolute Monocytes: 738 cells/uL (ref 200–950)
Basophils Absolute: 28 cells/uL (ref 0–200)
Basophils Relative: 0.4 %
Eosinophils Absolute: 92 cells/uL (ref 15–500)
Eosinophils Relative: 1.3 %
HCT: 34.9 % — ABNORMAL LOW (ref 38.5–50.0)
Hemoglobin: 11.8 g/dL — ABNORMAL LOW (ref 13.2–17.1)
Lymphs Abs: 1399 cells/uL (ref 850–3900)
MCH: 34.1 pg — ABNORMAL HIGH (ref 27.0–33.0)
MCHC: 33.8 g/dL (ref 32.0–36.0)
MCV: 100.9 fL — ABNORMAL HIGH (ref 80.0–100.0)
MPV: 13.3 fL — ABNORMAL HIGH (ref 7.5–12.5)
Monocytes Relative: 10.4 %
Neutro Abs: 4842 cells/uL (ref 1500–7800)
Neutrophils Relative %: 68.2 %
Platelets: 146 10*3/uL (ref 140–400)
RBC: 3.46 10*6/uL — ABNORMAL LOW (ref 4.20–5.80)
RDW: 12.1 % (ref 11.0–15.0)
Total Lymphocyte: 19.7 %
WBC: 7.1 10*3/uL (ref 3.8–10.8)

## 2021-09-11 LAB — PROTIME-INR
INR: 2.5 — ABNORMAL HIGH
Prothrombin Time: 23.7 s — ABNORMAL HIGH (ref 9.0–11.5)

## 2021-09-16 ENCOUNTER — Other Ambulatory Visit: Payer: Self-pay

## 2021-09-16 ENCOUNTER — Ambulatory Visit (INDEPENDENT_AMBULATORY_CARE_PROVIDER_SITE_OTHER): Payer: Medicare Other

## 2021-09-16 DIAGNOSIS — Z7901 Long term (current) use of anticoagulants: Secondary | ICD-10-CM | POA: Diagnosis not present

## 2021-09-16 LAB — POCT INR: INR: 2.6 (ref 2.0–3.0)

## 2021-09-16 NOTE — Patient Instructions (Addendum)
Pre visit review using our clinic review tool, if applicable. No additional management support is needed unless otherwise documented below in the visit note.  Continue to take 1 (4 mg) tablet daily.  Re-check in 5 weeks.

## 2021-10-11 ENCOUNTER — Other Ambulatory Visit: Payer: Self-pay

## 2021-10-11 ENCOUNTER — Ambulatory Visit (INDEPENDENT_AMBULATORY_CARE_PROVIDER_SITE_OTHER): Payer: Medicare Other | Admitting: Family Medicine

## 2021-10-11 VITALS — BP 140/60 | HR 67 | Temp 98.3°F | Wt 132.2 lb

## 2021-10-11 DIAGNOSIS — K921 Melena: Secondary | ICD-10-CM

## 2021-10-11 DIAGNOSIS — S80812A Abrasion, left lower leg, initial encounter: Secondary | ICD-10-CM | POA: Diagnosis not present

## 2021-10-11 DIAGNOSIS — K602 Anal fissure, unspecified: Secondary | ICD-10-CM | POA: Diagnosis not present

## 2021-10-11 NOTE — Progress Notes (Signed)
Established Patient Office Visit  Subjective:  Patient ID: Jesus [name], male    DOB: 1941-01-25  Age: 80 y.o. MRN: [id number]  CC:  Chief Complaint  Patient presents with   Blood In Stools    X 1 week, none over the past 3 days, BM are regular, no pain or discomfort    HPI Jesus [name] presents for intermittent bright red blood per rectum mostly with wiping first noted couple weeks ago.  None for the past few days.  Does have some intermittent constipation.  No perianal pain.  Occasional straining.  No abdominal pain.  He is on Coumadin.  Recent INR 2.6.  Last colonoscopy 2017.  He had 1 adenomatous polyp and 1 hyperplastic polyp then.  Is not aware of any external swelling to suggest external hemorrhoids.  No major problems with internal hemorrhoids in the past.  Denies any significant appetite or weight changes.  Patient also has abrasion left anterior leg.  Sustained a few days ago with a branch that scraped across his leg.  No progressive erythema.  Cleaning daily with soap and water.  No drainage.  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 S/P Ant MI 1980;  b. 1997 S/P CABG x 8 (LIMA to diag-LAD, SVG to OM1-OM2-OM3, SVG to [facility] - Dr [name]);  c. 01/2015 Cath: 3VD, 8/8 patent grafts.   Cardiomyopathy    Dysrhythmia    Esophageal reflux    GERD (gastroesophageal reflux disease)    Headache    History of colonoscopy    History of transesophageal echocardiography (TEE) for monitoring    Other and unspecified hyperlipidemia    S/P TAVR (transcatheter aortic valve replacement)    a. 03/2015 26 mm Edwards Sapien 3 transcatheter heart valve placed via open left transfemoral approach.   Skin lesions, generalized    facial which may represent  actinic keratoses and possible photosensitivity from Amiodarone   Unspecified essential hypertension     Past Surgical History:  Procedure Laterality Date   CARDIAC CATHETERIZATION     CARDIOVERSION  11/18/2006   Dr. [name]   CATARACT EXTRACTION W/ INTRAOCULAR LENS IMPLANT  April '13  (Dr. [name])   left eye only   CHOLECYSTECTOMY     CORONARY ARTERY BYPASS GRAFT  02/09/1996   LIMA to diag-LAD, SVG to OM1-OM2-OM3, SVG to [facility]   EYE SURGERY     LEFT AND RIGHT HEART CATHETERIZATION WITH CORONARY/GRAFT ANGIOGRAM N/A 01/26/2015   Procedure: LEFT AND RIGHT HEART CATHETERIZATION WITH [name];  Surgeon: [name], MD;  Location: [facility] CATH LAB;  Service: Cardiovascular;  Laterality: N/A;   TEE WITHOUT CARDIOVERSION N/A 03/10/2015   Procedure: TRANSESOPHAGEAL ECHOCARDIOGRAM (TEE);  Surgeon: [name], MD;  Location: [place];  Service: Open Heart Surgery;  Laterality: N/A;   TONSILLECTOMY     TRANSCATHETER AORTIC VALVE REPLACEMENT, TRANSFEMORAL N/A 03/10/2015   Procedure: TRANSCATHETER AORTIC VALVE REPLACEMENT, TRANSFEMORAL;  Surgeon: [name], MD;  Location: [place];  Service: Open Heart Surgery;  Laterality: N/A;    Family History  Problem Relation Age of Onset   Stroke Mother 60       Deceased   Crohn's disease Mother  Deceased   Heart attack Father 74       Deceased   Heart disease Father    Heart failure Father        Deceased   Atrial fibrillation Brother    Heart disease Paternal Uncle    Prostate cancer Paternal Uncle    Colon cancer Neg Hx     Social History   Socioeconomic History   Marital status: Married    Spouse name: Not on file   Number of children: Not on file   Years of education: Not on file   Highest education level: Not on file  Occupational History   Not on file  Tobacco Use   Smoking status: Never   Smokeless tobacco: Never   Tobacco comments:    Does not smoke.  Vaping Use   Vaping Use: Never used   Substance and Sexual Activity   Alcohol use: No    Alcohol/week: 0.0 standard drinks   Drug use: No   Sexual activity: Not Currently  Other Topics Concern   Not on file  Social History Narrative   HSG. [employer] 6 years. Married - '69 - 1 year/divorced. '76 - . No children. Work - mfg/textiles - Designer, multimedia; currently works doing maintenance. [employer] - they have discussed this. Provided packet august '13.                Social Determinants of Health   Financial Resource Strain: Low Risk    Difficulty of Paying Living Expenses: Not hard at all  Food Insecurity: No Food Insecurity   Worried About Charity fundraiser in the Last Year: Never true   [place] in the Last Year: Never true  Transportation Needs: No Transportation Needs   Lack of Transportation (Medical): No   Lack of Transportation (Non-Medical): No  Physical Activity: Sufficiently Active   Days of Exercise per Week: 5 days   Minutes of Exercise per Session: 60 min  Stress: No Stress Concern Present   Feeling of Stress : Not at all  Social Connections: Moderately Integrated   Frequency of Communication with Friends and Family: Three times a week   Frequency of Social Gatherings with Friends and Family: Three times a week   Attends Religious Services: More than 4 times per year   Active Member of Clubs or Organizations: Yes   Attends Archivist Meetings: More than 4 times per year   Marital Status: Widowed  Human resources officer Violence: Not At Risk   Fear of Current or Ex-Partner: No   Emotionally Abused: No   Physically Abused: No   Sexually Abused: No    Outpatient Medications Prior to Visit  Medication Sig Dispense Refill   acetaminophen (TYLENOL) 500 MG tablet Take 500 mg by mouth every 6 (six) hours as needed for moderate pain.     albuterol (VENTOLIN HFA) 108 (90 Base) MCG/ACT inhaler INHALE 2 PUFFS BY MOUTH EVERY 4 HOURS ASNEEDED FOR WHEEZING OR SHORTNESS OF BREATH. 6.7 g 1    amiodarone (PACERONE) 200 MG tablet TAKE 1/2 TABLET BY MOUTH DAILY 45 tablet 3   amLODipine (NORVASC) 5 MG tablet TAKE 1 TABLET BY MOUTH ONCE DAILY 90 tablet 3   amoxicillin (AMOXIL) 400 MG/5ML suspension TAKE 5 TEASPOONFULS BY MOUTH 1 HOUR PRIOR TO DENTAL PROCEDURE 100 mL 0   Ascorbic Acid (VITAMIN C) 1000 MG tablet Take 1,000 mg by mouth daily.     aspirin 81 MG tablet Take 81  mg by mouth daily.     atorvastatin (LIPITOR) 80 MG tablet TAKE 1/2 TABLET BY MOUTH DAILY 90 tablet 1   loratadine (CLARITIN) 10 MG tablet Take 10 mg by mouth daily as needed for allergies.     losartan (COZAAR) 100 MG tablet Take 1 tablet (100 mg total) by mouth daily. 90 tablet 1   Multiple Vitamins-Minerals (MULTIVITAMIN,TX-MINERALS) tablet Take 1 tablet by mouth daily.     warfarin (COUMADIN) 4 MG tablet TAKE 1 TABLET BY MOUTH DAILY OR TAKE AS DIRECTED BY ANTICOAGULATION CLINIC 105 tablet 1   No facility-administered medications prior to visit.    Allergies  Allergen Reactions   Codeine Other (See Comments)    Pt does not remember   Iodine Swelling    ROS Review of Systems  Constitutional:  Negative for appetite change, chills, fever and unexpected weight change.  Respiratory:  Negative for cough and shortness of breath.   Cardiovascular:  Negative for chest pain.  Gastrointestinal:  Positive for constipation. Negative for abdominal pain, diarrhea, nausea, rectal pain and vomiting.     Objective:    Physical Exam Vitals reviewed.  Cardiovascular:     Rate and Rhythm: Normal rate and regular rhythm.  Pulmonary:     Effort: Pulmonary effort is normal.     Breath sounds: Normal breath sounds.  Genitourinary:    Comments: Very small nonbleeding anal fissure around the 12 o'clock position.  No external hemorrhoids.  Digital exam reveals brown stool which is Hemoccult negative.  No rectal masses palpated. Skin:    Comments: Superficial linear abrasion left anterior leg.  No surrounding cellulitis  changes.  No purulent changes.  Abrasion is approximately 1 and half centimeters in length and 1/2 cm diameter  Neurological:     Mental Status: He is alert.    BP 140/60 (BP Location: Left Arm, Patient Position: Sitting, Cuff Size: Normal)   Pulse 67   Temp 98.3 F (36.8 C) (Oral)   Wt 132 lb 3.2 oz (60 kg)   SpO2 98%   BMI 25.82 kg/m  Wt Readings from Last 3 Encounters:  10/11/21 132 lb 3.2 oz (60 kg)  09/10/21 130 lb (59 kg)  07/12/21 126 lb 3.2 oz (57.2 kg)     Health Maintenance Due  Topic Date Due   COVID-19 Vaccine (4 - Booster) 01/18/2021    There are no preventive care reminders to display for this patient.  Lab Results  Component Value Date   TSH 3.270 06/02/2021   Lab Results  Component Value Date   WBC 7.1 09/10/2021   HGB 11.8 (L) 09/10/2021   HCT 34.9 (L) 09/10/2021   MCV 100.9 (H) 09/10/2021   PLT 146 09/10/2021   Lab Results  Component Value Date   NA 140 06/02/2021   K 4.4 06/02/2021   CO2 24 06/02/2021   GLUCOSE 101 (H) 06/02/2021   BUN 13 06/02/2021   CREATININE 1.03 06/02/2021   BILITOT 0.5 06/02/2021   ALKPHOS 122 (H) 06/02/2021   AST 25 06/02/2021   ALT 22 06/02/2021   PROT 6.7 06/02/2021   ALBUMIN 4.3 06/02/2021   CALCIUM 9.0 06/02/2021   ANIONGAP 8 12/02/2020   EGFR 73 06/02/2021   GFR 104.41 01/21/2015   Lab Results  Component Value Date   CHOL 141 06/02/2021   Lab Results  Component Value Date   HDL 51 06/02/2021   Lab Results  Component Value Date   LDLCALC 71 06/02/2021   Lab Results  Component  Value Date   TRIG 101 06/02/2021   Lab Results  Component Value Date   CHOLHDL 2.8 06/02/2021   Lab Results  Component Value Date   HGBA1C 6.0 (H) 03/06/2015      Assessment & Plan:   #1 hematochezia.  Most likely related to a very small anal fissure around 12 o'clock position.  No associated pain.  Patient is on Coumadin.  No active bleeding at this time and Hemoccult negative.  -We discussed at some length  measures to reduce constipation and try to get 25 to 30 g of fiber per day with handout given.  Increase fluid intake.  Consider stool softener if necessary. -Consider daily sitz baths.  Follow-up for any associated pain or recurrent bleeding.  #2 abrasion left anterior leg.  No signs of secondary infection.  Continue to clean daily with soap and water and follow-up promptly for signs of secondary infection.    No orders of the defined types were placed in this encounter.   Follow-up: No follow-ups on file.    [name], MD

## 2021-10-19 ENCOUNTER — Telehealth: Payer: Self-pay

## 2021-10-19 NOTE — Telephone Encounter (Signed)
Spoke with the patient. He is aware of Dr. Erick Blinks message and an appointment has been scheduled.

## 2021-10-19 NOTE — Telephone Encounter (Signed)
Patient called stating that he has blood in his stool and wants to know what to do patient stated he was seen last week

## 2021-10-21 ENCOUNTER — Other Ambulatory Visit: Payer: Self-pay

## 2021-10-21 ENCOUNTER — Ambulatory Visit (INDEPENDENT_AMBULATORY_CARE_PROVIDER_SITE_OTHER): Payer: Medicare Other

## 2021-10-21 DIAGNOSIS — Z7901 Long term (current) use of anticoagulants: Secondary | ICD-10-CM

## 2021-10-21 LAB — POCT INR: INR: 2.7 (ref 2.0–3.0)

## 2021-10-21 NOTE — Patient Instructions (Addendum)
Pre visit review using our clinic review tool, if applicable. No additional management support is needed unless otherwise documented below in the visit note.  Continue to take 1 (4 mg) tablet daily.  Re-check in 6 weeks.

## 2021-10-21 NOTE — Progress Notes (Signed)
Continue to take 1 (4 mg) tablet daily.  Re-check in 6 weeks.

## 2021-10-26 DIAGNOSIS — S51811A Laceration without foreign body of right forearm, initial encounter: Secondary | ICD-10-CM | POA: Diagnosis not present

## 2021-11-09 ENCOUNTER — Encounter: Payer: Self-pay | Admitting: Family Medicine

## 2021-11-09 ENCOUNTER — Ambulatory Visit (INDEPENDENT_AMBULATORY_CARE_PROVIDER_SITE_OTHER): Payer: Medicare Other | Admitting: Family Medicine

## 2021-11-09 VITALS — BP 138/60 | HR 63 | Temp 98.2°F | Ht 60.0 in | Wt 133.8 lb

## 2021-11-09 DIAGNOSIS — K602 Anal fissure, unspecified: Secondary | ICD-10-CM

## 2021-11-09 DIAGNOSIS — I10 Essential (primary) hypertension: Secondary | ICD-10-CM

## 2021-11-09 NOTE — Progress Notes (Signed)
Established Patient Office Visit  Subjective:  Patient ID: Jesus [name], male    DOB: [birth year]  Age: 80 y.o. MRN: [id number]  CC:  Chief Complaint  Patient presents with   Follow-up    HPI Jesus [name] presents for follow-up.  He recently had some bright red blood per rectum.  We noted a small anal fissure.  He had called back about a week later stating that he was still having some blood in his stool.  We had advised some Vaseline and increasing fiber intake and continue stool softener.  He has not had any recent straining and has not noted any blood whatsoever now for the past month.  We had discussed possible anoscopy at this point-bleeding not subsided.  He states he has excellent appetite.  Weight stable.  Last colonoscopy 2017.  He is on Coumadin. No recent pain with bowel movements.  He had recent fall and he was sweeping floor and fell with abrasion right forearm.  Went to urgent care and had Steri-Strips applied.  This appears to be healing well.  He has hypertension treated with losartan and amlodipine.  Initial blood pressure up today but this has generally been well controlled.  He denies any recent chest pains.  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 S/P Ant MI 1980;  b. 1997 S/P CABG x 8 (LIMA to diag-LAD, SVG to OM1-OM2-OM3, SVG to [facility] - Dr [name]);  c. 01/2015 Cath: 3VD, 8/8 patent grafts.   Cardiomyopathy    Dysrhythmia    Esophageal reflux    GERD (gastroesophageal reflux disease)    Headache    History of colonoscopy    History of transesophageal echocardiography (TEE) for monitoring    Other and unspecified hyperlipidemia    S/P TAVR (transcatheter aortic valve replacement)    a. 03/2015 26 mm Edwards Sapien 3  transcatheter heart valve placed via open left transfemoral approach.   Skin lesions, generalized    facial which may represent actinic keratoses and possible photosensitivity from Amiodarone   Unspecified essential hypertension     Past Surgical History:  Procedure Laterality Date   CARDIAC CATHETERIZATION     CARDIOVERSION  11/18/2006   Dr. [name]   CATARACT EXTRACTION W/ INTRAOCULAR LENS IMPLANT  April '13  (Dr. [name])   left eye only   CHOLECYSTECTOMY     CORONARY ARTERY BYPASS GRAFT  02/09/1996   LIMA to diag-LAD, SVG to OM1-OM2-OM3, SVG to [facility]   EYE SURGERY     LEFT AND RIGHT HEART CATHETERIZATION WITH CORONARY/GRAFT ANGIOGRAM N/A 01/26/2015   Procedure: LEFT AND RIGHT HEART CATHETERIZATION WITH [name];  Surgeon: [name], MD;  Location: [facility] CATH LAB;  Service: Cardiovascular;  Laterality: N/A;   TEE WITHOUT CARDIOVERSION N/A 03/10/2015   Procedure: TRANSESOPHAGEAL ECHOCARDIOGRAM (TEE);  Surgeon: [name], MD;  Location: [place];  Service: Open Heart Surgery;  Laterality: N/A;   TONSILLECTOMY     TRANSCATHETER AORTIC VALVE REPLACEMENT, TRANSFEMORAL N/A 03/10/2015   Procedure: TRANSCATHETER AORTIC VALVE REPLACEMENT, TRANSFEMORAL;  Surgeon: [name], MD;  Location: [place];  Service: Open Heart Surgery;  Laterality: N/A;    Family History  Problem Relation Age of Onset   Stroke  Mother 33       Deceased   Crohn's disease Mother        Deceased   Heart attack Father 27       Deceased   Heart disease Father    Heart failure Father        Deceased   Atrial fibrillation Brother    Heart disease Paternal Uncle    Prostate cancer Paternal Uncle    Colon cancer Neg Hx     Social History   Socioeconomic History   Marital status: Married    Spouse name: Not on file   Number of children: Not on file   Years of education: Not on file   Highest education level: Not on file  Occupational History   Not on file  Tobacco Use   Smoking  status: Never   Smokeless tobacco: Never   Tobacco comments:    Does not smoke.  Vaping Use   Vaping Use: Never used  Substance and Sexual Activity   Alcohol use: No    Alcohol/week: 0.0 standard drinks   Drug use: No   Sexual activity: Not Currently  Other Topics Concern   Not on file  Social History Narrative   HSG. [employer] 6 years. Married - '69 - 1 year/divorced. '76 - . No children. Work - mfg/textiles - Designer, multimedia; currently works doing maintenance. [employer] - they have discussed this. Provided packet august '13.                Social Determinants of Health   Financial Resource Strain: Low Risk    Difficulty of Paying Living Expenses: Not hard at all  Food Insecurity: No Food Insecurity   Worried About Charity fundraiser in the Last Year: Never true   [place] in the Last Year: Never true  Transportation Needs: No Transportation Needs   Lack of Transportation (Medical): No   Lack of Transportation (Non-Medical): No  Physical Activity: Sufficiently Active   Days of Exercise per Week: 5 days   Minutes of Exercise per Session: 60 min  Stress: No Stress Concern Present   Feeling of Stress : Not at all  Social Connections: Moderately Integrated   Frequency of Communication with Friends and Family: Three times a week   Frequency of Social Gatherings with Friends and Family: Three times a week   Attends Religious Services: More than 4 times per year   Active Member of Clubs or Organizations: Yes   Attends Archivist Meetings: More than 4 times per year   Marital Status: Widowed  Human resources officer Violence: Not At Risk   Fear of Current or Ex-Partner: No   Emotionally Abused: No   Physically Abused: No   Sexually Abused: No    Outpatient Medications Prior to Visit  Medication Sig Dispense Refill   acetaminophen (TYLENOL) 500 MG tablet Take 500 mg by mouth every 6 (six) hours as needed for moderate pain.     albuterol (VENTOLIN HFA)  108 (90 Base) MCG/ACT inhaler INHALE 2 PUFFS BY MOUTH EVERY 4 HOURS ASNEEDED FOR WHEEZING OR SHORTNESS OF BREATH. 6.7 g 1   amiodarone (PACERONE) 200 MG tablet TAKE 1/2 TABLET BY MOUTH DAILY 45 tablet 3   amLODipine (NORVASC) 5 MG tablet TAKE 1 TABLET BY MOUTH ONCE DAILY 90 tablet 3   amoxicillin (AMOXIL) 400 MG/5ML suspension TAKE 5 TEASPOONFULS BY MOUTH 1 HOUR PRIOR TO DENTAL PROCEDURE 100 mL 0   Ascorbic Acid (  VITAMIN C) 1000 MG tablet Take 1,000 mg by mouth daily.     aspirin 81 MG tablet Take 81 mg by mouth daily.     atorvastatin (LIPITOR) 80 MG tablet TAKE 1/2 TABLET BY MOUTH DAILY 90 tablet 1   loratadine (CLARITIN) 10 MG tablet Take 10 mg by mouth daily as needed for allergies.     losartan (COZAAR) 100 MG tablet Take 1 tablet (100 mg total) by mouth daily. 90 tablet 1   Multiple Vitamins-Minerals (MULTIVITAMIN,TX-MINERALS) tablet Take 1 tablet by mouth daily.     warfarin (COUMADIN) 4 MG tablet TAKE 1 TABLET BY MOUTH DAILY OR TAKE AS DIRECTED BY ANTICOAGULATION CLINIC 105 tablet 1   No facility-administered medications prior to visit.    Allergies  Allergen Reactions   Codeine Other (See Comments)    Pt does not remember   Iodine Swelling    ROS Review of Systems  Constitutional:  Negative for fatigue.  Eyes:  Negative for visual disturbance.  Respiratory:  Negative for cough, chest tightness and shortness of breath.   Cardiovascular:  Negative for chest pain, palpitations and leg swelling.  Neurological:  Negative for dizziness, syncope, weakness, light-headedness and headaches.     Objective:    Physical Exam Constitutional:      Appearance: He is well-developed.  HENT:     Right Ear: External ear normal.     Left Ear: External ear normal.  Eyes:     Pupils: Pupils are equal, round, and reactive to light.  Neck:     Thyroid: No thyromegaly.  Cardiovascular:     Rate and Rhythm: Normal rate.  Pulmonary:     Effort: Pulmonary effort is normal. No respiratory  distress.     Breath sounds: Normal breath sounds. No wheezing or rales.  Musculoskeletal:     Cervical back: Neck supple.  Neurological:     Mental Status: He is alert and oriented to person, place, and time.    BP 138/60 (BP Location: Left Arm, Cuff Size: Normal)   Pulse 63   Temp 98.2 F (36.8 C) (Oral)   Ht 5' (1.524 m)   Wt 133 lb 12.8 oz (60.7 kg)   SpO2 98%   BMI 26.13 kg/m  Wt Readings from Last 3 Encounters:  11/09/21 133 lb 12.8 oz (60.7 kg)  10/11/21 132 lb 3.2 oz (60 kg)  09/10/21 130 lb (59 kg)     Health Maintenance Due  Topic Date Due   COVID-19 Vaccine (4 - Booster) 01/18/2021    There are no preventive care reminders to display for this patient.  Lab Results  Component Value Date   TSH 3.270 06/02/2021   Lab Results  Component Value Date   WBC 7.1 09/10/2021   HGB 11.8 (L) 09/10/2021   HCT 34.9 (L) 09/10/2021   MCV 100.9 (H) 09/10/2021   PLT 146 09/10/2021   Lab Results  Component Value Date   NA 140 06/02/2021   K 4.4 06/02/2021   CO2 24 06/02/2021   GLUCOSE 101 (H) 06/02/2021   BUN 13 06/02/2021   CREATININE 1.03 06/02/2021   BILITOT 0.5 06/02/2021   ALKPHOS 122 (H) 06/02/2021   AST 25 06/02/2021   ALT 22 06/02/2021   PROT 6.7 06/02/2021   ALBUMIN 4.3 06/02/2021   CALCIUM 9.0 06/02/2021   ANIONGAP 8 12/02/2020   EGFR 73 06/02/2021   GFR 104.41 01/21/2015   Lab Results  Component Value Date   CHOL 141 06/02/2021   Lab  Results  Component Value Date   HDL 51 06/02/2021   Lab Results  Component Value Date   LDLCALC 71 06/02/2021   Lab Results  Component Value Date   TRIG 101 06/02/2021   Lab Results  Component Value Date   CHOLHDL 2.8 06/02/2021   Lab Results  Component Value Date   HGBA1C 6.0 (H) 03/06/2015      Assessment & Plan:   #1 hypertension stable.  Continue current dose of losartan and amlodipine  #2 recent bright red blood per rectum related to small anal fissure.  No further symptoms.  Continue  measures to reduce constipation.  Be in touch for any recurrent bleeding    No orders of the defined types were placed in this encounter.   Follow-up: Return in about 3 months (around 02/07/2022).    [name], MD

## 2021-11-15 ENCOUNTER — Ambulatory Visit
Admission: RE | Admit: 2021-11-15 | Discharge: 2021-11-15 | Disposition: A | Discharge status: Home / Self Care | Payer: Medicare Other | Source: Ambulatory Visit | Attending: Cardiovascular Disease | Admitting: Cardiovascular Disease

## 2021-11-15 ENCOUNTER — Other Ambulatory Visit: Payer: Self-pay

## 2021-11-15 ENCOUNTER — Ambulatory Visit: Payer: Medicare Other | Admitting: Cardiovascular Disease

## 2021-11-15 ENCOUNTER — Encounter: Payer: Self-pay | Admitting: Cardiovascular Disease

## 2021-11-15 VITALS — BP 130/70 | HR 67 | Ht 61.0 in | Wt 132.2 lb

## 2021-11-15 DIAGNOSIS — I359 Nonrheumatic aortic valve disorder, unspecified: Secondary | ICD-10-CM | POA: Diagnosis not present

## 2021-11-15 DIAGNOSIS — I2510 Atherosclerotic heart disease of native coronary artery without angina pectoris: Secondary | ICD-10-CM | POA: Diagnosis not present

## 2021-11-15 DIAGNOSIS — I5042 Chronic combined systolic (congestive) and diastolic (congestive) heart failure: Secondary | ICD-10-CM | POA: Diagnosis not present

## 2021-11-15 DIAGNOSIS — Z79899 Other long term (current) drug therapy: Secondary | ICD-10-CM

## 2021-11-15 DIAGNOSIS — Z951 Presence of aortocoronary bypass graft: Secondary | ICD-10-CM | POA: Diagnosis not present

## 2021-11-15 DIAGNOSIS — I480 Paroxysmal atrial fibrillation: Secondary | ICD-10-CM

## 2021-11-15 DIAGNOSIS — E782 Mixed hyperlipidemia: Secondary | ICD-10-CM

## 2021-11-15 LAB — BASIC METABOLIC PANEL
BUN/Creatinine Ratio: 17 (ref 10–24)
BUN: 17 mg/dL (ref 8–27)
CO2: 25 mmol/L (ref 20–29)
Calcium: 9.3 mg/dL (ref 8.6–10.2)
Chloride: 106 mmol/L (ref 96–106)
Creatinine, Ser: 0.99 mg/dL (ref 0.76–1.27)
Glucose: 86 mg/dL (ref 70–99)
Potassium: 4.6 mmol/L (ref 3.5–5.2)
Sodium: 141 mmol/L (ref 134–144)
eGFR: 76 mL/min/{1.73_m2} (ref >59)

## 2021-11-15 LAB — HEPATIC FUNCTION PANEL
ALT: 22 IU/L (ref 0–44)
AST: 25 IU/L (ref 0–40)
Albumin: 4.3 g/dL (ref 3.6–4.6)
Alkaline Phosphatase: 120 IU/L (ref 44–121)
Bilirubin Total: 0.4 mg/dL (ref 0.0–1.2)
Bilirubin, Direct: 0.16 mg/dL (ref 0.00–0.40)
Total Protein: 6.7 g/dL (ref 6.0–8.5)

## 2021-11-15 LAB — TSH: TSH: 4.32 u[IU]/mL (ref 0.450–4.500)

## 2021-11-15 NOTE — Progress Notes (Signed)
Cardiology Office Note:    Date:  11/15/2021   ID:  Jesus [name], DOB June 08, 1941, MRN [id number]  PCP:  [name], MD   [facility] Providers Cardiologist:  [name], MD     Referring MD: [name], MD   Chief Complaint  Patient presents with   Coronary Artery Disease    History of Present Illness:    Jesus [name] is a 80 y.o. male with a hx of coronary artery disease with remote MI, aortic valve disease status post TAVR, and persistent atrial fibrillation maintaining sinus rhythm on long-term amiodarone.  LVEF has generally ranged from 40 to 45%.  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  The patient is here alone today.  He is still going to work every day.  He has now a widower for just over 6 months.  He has experienced a lot of loss with the death of his brother, his wife, and a close friend who was a Industrial/product designer, all within the last year.  He reports compliance with his medications.  He still does his own yard work. Today, he denies symptoms of palpitations, chest pain, shortness of breath, orthopnea, PND, dizziness, or syncope. He has had mild swelling and skin discoloration in his legs that he would like checked out today.  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 S/P Ant MI 1980;  b. 1997 S/P CABG x 8 (LIMA to diag-LAD, SVG to OM1-OM2-OM3, SVG to [facility] - Dr [name]);  c. 01/2015 Cath: 3VD, 8/8 patent grafts.   Cardiomyopathy    Dysrhythmia    Esophageal reflux    GERD (gastroesophageal reflux disease)    Headache    History of colonoscopy    History of transesophageal echocardiography (TEE) for monitoring    Other and unspecified hyperlipidemia     S/P TAVR (transcatheter aortic valve replacement)    a. 03/2015 26 mm Edwards Sapien 3 transcatheter heart valve placed via open left transfemoral approach.   Skin lesions, generalized    facial which [name] represent actinic keratoses and possible photosensitivity from Amiodarone   Unspecified essential hypertension     Past Surgical History:  Procedure Laterality Date   CARDIAC CATHETERIZATION     CARDIOVERSION  11/18/2006   Dr. [name]   CATARACT EXTRACTION W/ INTRAOCULAR LENS IMPLANT  April '13  (Dr. [name])   left eye only   CHOLECYSTECTOMY     CORONARY ARTERY BYPASS GRAFT  02/09/1996   LIMA to diag-LAD, SVG to OM1-OM2-OM3, SVG to [facility]   EYE SURGERY     LEFT AND RIGHT HEART CATHETERIZATION WITH CORONARY/GRAFT ANGIOGRAM N/A 01/26/2015   Procedure: LEFT AND RIGHT HEART CATHETERIZATION WITH [name];  Surgeon: [name], MD;  Location: [facility] CATH LAB;  Service: Cardiovascular;  Laterality: N/A;   TEE WITHOUT CARDIOVERSION N/A 03/10/2015   Procedure: TRANSESOPHAGEAL ECHOCARDIOGRAM (TEE);  Surgeon: [name], MD;  Location: [place];  Service: Open Heart Surgery;  Laterality: N/A;   TONSILLECTOMY     TRANSCATHETER AORTIC VALVE REPLACEMENT, TRANSFEMORAL N/A 03/10/2015   Procedure: TRANSCATHETER AORTIC VALVE REPLACEMENT, TRANSFEMORAL;  Surgeon: [name], MD;  Location: [place];  Service: Open Heart  Surgery;  Laterality: N/A;    Current Medications: Current Meds  Medication Sig   acetaminophen (TYLENOL) 500 MG tablet Take 500 mg by mouth every 6 (six) hours as needed for moderate pain.   albuterol (VENTOLIN HFA) 108 (90 Base) MCG/ACT inhaler INHALE 2 PUFFS BY MOUTH EVERY 4 HOURS ASNEEDED FOR WHEEZING OR SHORTNESS OF BREATH.   amiodarone (PACERONE) 200 MG tablet TAKE 1/2 TABLET BY MOUTH DAILY   amLODipine (NORVASC) 5 MG tablet TAKE 1 TABLET BY MOUTH ONCE DAILY   amoxicillin (AMOXIL) 400 MG/5ML suspension TAKE 5 TEASPOONFULS BY MOUTH 1 HOUR PRIOR TO DENTAL  PROCEDURE   Ascorbic Acid (VITAMIN C) 1000 MG tablet Take 1,000 mg by mouth daily.   aspirin 81 MG tablet Take 81 mg by mouth daily.   atorvastatin (LIPITOR) 80 MG tablet TAKE 1/2 TABLET BY MOUTH DAILY   loratadine (CLARITIN) 10 MG tablet Take 10 mg by mouth daily as needed for allergies.   losartan (COZAAR) 100 MG tablet Take 1 tablet (100 mg total) by mouth daily.   Multiple Vitamins-Minerals (MULTIVITAMIN,TX-MINERALS) tablet Take 1 tablet by mouth daily.   warfarin (COUMADIN) 4 MG tablet TAKE 1 TABLET BY MOUTH DAILY OR TAKE AS DIRECTED BY ANTICOAGULATION CLINIC     Allergies:   Codeine and Iodine   Social History   Socioeconomic History   Marital status: Married    Spouse name: Not on file   Number of children: Not on file   Years of education: Not on file   Highest education level: Not on file  Occupational History   Not on file  Tobacco Use   Smoking status: Never   Smokeless tobacco: Never   Tobacco comments:    Does not smoke.  Vaping Use   Vaping Use: Never used  Substance and Sexual Activity   Alcohol use: No    Alcohol/week: 0.0 standard drinks   Drug use: No   Sexual activity: Not Currently  Other Topics Concern   Not on file  Social History Narrative   HSG. [place] 6 years. Married - '69 - 1 year/divorced. '76 - . No children. Work - mfg/textiles - Designer, multimedia; currently works doing maintenance. [employer] - they have discussed this. Provided packet august '13.                Social Determinants of Health   Financial Resource Strain: Low Risk    Difficulty of Paying Living Expenses: Not hard at all  Food Insecurity: No Food Insecurity   Worried About Charity fundraiser in the Last Year: Never true   [place] in the Last Year: Never true  Transportation Needs: No Transportation Needs   Lack of Transportation (Medical): No   Lack of Transportation (Non-Medical): No  Physical Activity: Sufficiently Active   Days of Exercise per Week:  5 days   Minutes of Exercise per Session: 60 min  Stress: No Stress Concern Present   Feeling of Stress : Not at all  Social Connections: Moderately Integrated   Frequency of Communication with Friends and Family: Three times a week   Frequency of Social Gatherings with Friends and Family: Three times a week   Attends Religious Services: More than 4 times per year   Active Member of Clubs or Organizations: Yes   Attends Archivist Meetings: More than 4 times per year   Marital Status: Widowed     Family History: The patient's family history includes Atrial fibrillation in  his brother; Crohn's disease in his mother; Heart attack (age of onset: 91) in his father; Heart disease in his father and paternal uncle; Heart failure in his father; Prostate cancer in his paternal uncle; Stroke (age of onset: 72) in his mother. There is no history of Colon cancer.  ROS:   Please see the history of present illness.    All other systems reviewed and are negative.  EKGs/Labs/Other Studies Reviewed:    The following studies were reviewed today: Echo 06/02/2021: IMPRESSIONS     1. Left ventricular ejection fraction, by estimation, is 45 to 50%. Left  ventricular ejection fraction by PLAX is 50 %. The left ventricle has  mildly decreased function. The left ventricle has no regional wall motion  abnormalities. Left ventricular  diastolic parameters are consistent with Grade I diastolic dysfunction  (impaired relaxation). There is severe akinesis of the left ventricular,  entire inferior wall and inferolateral wall.   2. Right ventricular systolic function is normal. The right ventricular  size is normal. There is normal pulmonary artery systolic pressure.   3. The mitral valve is abnormal. Trivial mitral valve regurgitation.   4. The aortic valve has been repaired/replaced. Aortic valve  regurgitation is trivial. There is a 26 mm Sapien prosthetic (TAVR) valve  present in the aortic  position. Procedure Date: 03/2015. Echo findings are  consistent with perivalvular leak of the  aortic prosthesis. Aortic valve area, by VTI measures 0.89 cm. Aortic  valve mean gradient measures 15.0 mmHg. Aortic valve Vmax measures 2.69  m/s.   5. The inferior vena cava is normal in size with greater than 50%  respiratory variability, suggesting right atrial pressure of 3 mmHg.   Comparison(s): Changes from prior study are noted. 05/01/2020: LVEF 40-45%,  26 mm TAVR valve, mean gradient 14 mmHg, no reported perivalvular leak.   EKG:  EKG is ordered today.  The ekg ordered today demonstrates normal sinus rhythm 67 bpm, age-indeterminate inferior infarct, nonspecific IVCD.  No significant change from previous.  Recent Labs: 06/02/2021: ALT 22; BUN 13; Creatinine, Ser 1.03; Potassium 4.4; Sodium 140; TSH 3.270 09/10/2021: Hemoglobin 11.8; Platelets 146  Recent Lipid Panel    Component Value Date/Time   CHOL 141 06/02/2021 0732   TRIG 101 06/02/2021 0732   TRIG 67 11/20/2006 0748   HDL 51 06/02/2021 0732   CHOLHDL 2.8 06/02/2021 0732   CHOLHDL 3.0 09/12/2016 1311   VLDL 24 09/12/2016 1311   LDLCALC 71 06/02/2021 0732     Risk Assessment/Calculations:    CHA2DS2-VASc Score = 5   This indicates a 7.2% annual risk of stroke. The patient's score is based upon: CHF History: 1 HTN History: 1 Diabetes History: 0 Stroke History: 0 Vascular Disease History: 1 Age Score: 2 Gender Score: 0         Physical Exam:    VS:  BP 130/70   Pulse 67   Ht 5\' 1"  (1.549 m)   Wt 132 lb 3.2 oz (60 kg)   SpO2 97%   BMI 24.98 kg/m     Wt Readings from Last 3 Encounters:  11/15/21 132 lb 3.2 oz (60 kg)  11/09/21 133 lb 12.8 oz (60.7 kg)  10/11/21 132 lb 3.2 oz (60 kg)     GEN:  Well nourished, well developed pleasant elderly male in no acute distress HEENT: Normal NECK: No JVD; No carotid bruits LYMPHATICS: No lymphadenopathy CARDIAC: RRR, 2/6 systolic ejection murmur at the right  upper sternal border, 2/6 diastolic decrescendo  murmur best heard at the left upper sternal border RESPIRATORY:  Clear to auscultation without rales, wheezing or rhonchi  ABDOMEN: Soft, non-tender, non-distended MUSCULOSKELETAL: Trace bilateral pretibial edema; No deformity  SKIN: Warm and dry, stasis changes in lower legs bilaterally NEUROLOGIC:  Alert and oriented x 3 PSYCHIATRIC:  Normal affect   ASSESSMENT:    1. Paroxysmal atrial fibrillation (HCC)   2. Coronary artery disease involving native coronary artery of native heart without angina pectoris   3. Aortic valve disorder   4. Chronic combined systolic and diastolic CHF (congestive heart failure) ([place])   5. Mixed hyperlipidemia   6. Long term current use of amiodarone    PLAN:    In order of problems listed above:  Maintaining sinus rhythm on amiodarone.  EKG reviewed today and confirm sinus rhythm.  Anticoagulated with warfarin.  No major bleeding problems reported.  He did have some epistaxis recently but attributed this to the dry conditions.  Does not seem to be an ongoing problem.  We will check liver function tests and TSH today for amiodarone monitoring.  We will check a PA and lateral chest x-ray for amiodarone monitoring as well. Appears stable.  Taking antiplatelet therapy with aspirin.  Treated with high intensity statin drug. Patient with moderate paravalvular regurgitation after undergoing TAVR several years ago.  Recommend repeat echocardiogram when he returns in 6 months.  Does not appear to have any symptoms.  Exam is stable. Clinically stable, LVEF has been in the range of 45%.  No congestive symptoms. Treated with atorvastatin 40 mg daily.  LDL cholesterol 71 mg/dL.           Medication Adjustments/Labs and Tests Ordered: Current medicines are reviewed at length with the patient today.  Concerns regarding medicines are outlined above.  Orders Placed This Encounter  Procedures   DG Chest 2 View   TSH    Basic metabolic panel   Hepatic function panel   EKG 12-Lead   ECHOCARDIOGRAM COMPLETE    No orders of the defined types were placed in this encounter.   Patient Instructions  Medication Instructions:  Your physician recommends that you continue on your current medications as directed. Please refer to the Current Medication list given to you today.  *If you need a refill on your cardiac medications before your next appointment, please call your pharmacy*   Lab Work: BMET, Liver and TSH today  If you have labs (blood work) drawn today and your tests are completely normal, you will receive your results only by: [place] (if you have MyChart) OR A paper copy in the mail If you have any lab test that is abnormal or we need to change your treatment, we will call you to review the results.   Testing/Procedures: Your physician has requested that you have an echocardiogram 1-2 weeks prior to seeing Dr. [name] back in 6 months. Echocardiography is a painless test that uses sound waves to create images of your heart. It provides your doctor with information about the size and shape of your heart and how well your heart's chambers and valves are working. This procedure takes approximately one hour. There are no restrictions for this procedure.  A chest x-ray takes a picture of the organs and structures inside the chest, including the heart, lungs, and blood vessels. This test can show several things, including, whether the heart is enlarges; whether fluid is building up in the lungs; and whether pacemaker / defibrillator leads are still in place. Go  to [place] at [place] [address] to have this completed.  You do not have to have an appointment, just walk in.   Follow-Up: At [facility], you and your health needs are our priority.  As part of our continuing mission to provide you with exceptional heart care, we have created designated Provider Care Teams.  These Care Teams  include your primary Cardiologist (physician) and Advanced Practice Providers (APPs -  Physician Assistants and Nurse Practitioners) who all work together to provide you with the care you need, when you need it.  We recommend signing up for the patient portal called "MyChart".  Sign up information is provided on this After Visit Summary.  MyChart is used to connect with patients for Virtual Visits (Telemedicine).  Patients are able to view lab/test results, encounter notes, upcoming appointments, etc.  Non-urgent messages can be sent to your provider as well.   To learn more about what you can do with MyChart, go to [email or web address].    Your next appointment:   6 month(s)  The format for your next appointment:   In Person  Provider:   [name], MD     Other Instructions     Signed, [name], MD  11/15/2021 8:28 AM    [place]

## 2021-11-15 NOTE — Patient Instructions (Signed)
Medication Instructions:  Your physician recommends that you continue on your current medications as directed. Please refer to the Current Medication list given to you today.  *If you need a refill on your cardiac medications before your next appointment, please call your pharmacy*   Lab Work: BMET, Liver and TSH today  If you have labs (blood work) drawn today and your tests are completely normal, you will receive your results only by: [place] (if you have MyChart) OR A paper copy in the mail If you have any lab test that is abnormal or we need to change your treatment, we will call you to review the results.   Testing/Procedures: Your physician has requested that you have an echocardiogram 1-2 weeks prior to seeing Dr. [name] back in 6 months. Echocardiography is a painless test that uses sound waves to create images of your heart. It provides your doctor with information about the size and shape of your heart and how well your heart's chambers and valves are working. This procedure takes approximately one hour. There are no restrictions for this procedure.  A chest x-ray takes a picture of the organs and structures inside the chest, including the heart, lungs, and blood vessels. This test can show several things, including, whether the heart is enlarges; whether fluid is building up in the lungs; and whether pacemaker / defibrillator leads are still in place. Go to [facility] at [place] [address] to have this completed.  You do not have to have an appointment, just walk in.   Follow-Up: At [facility], you and your health needs are our priority.  As part of our continuing mission to provide you with exceptional heart care, we have created designated Provider Care Teams.  These Care Teams include your primary Cardiologist (physician) and Advanced Practice Providers (APPs -  Physician Assistants and Nurse Practitioners) who all work together to provide you with the care  you need, when you need it.  We recommend signing up for the patient portal called "MyChart".  Sign up information is provided on this After Visit Summary.  MyChart is used to connect with patients for Virtual Visits (Telemedicine).  Patients are able to view lab/test results, encounter notes, upcoming appointments, etc.  Non-urgent messages can be sent to your provider as well.   To learn more about what you can do with MyChart, go to [email or web address].    Your next appointment:   6 month(s)  The format for your next appointment:   In Person  Provider:   [name], MD     Other Instructions

## 2021-11-16 ENCOUNTER — Telehealth: Payer: Self-pay | Admitting: Cardiovascular Disease

## 2021-11-16 NOTE — Telephone Encounter (Signed)
Message Received: Today [name], MD  [place] Triage Reviewed and looks okay.  No evidence of amiodarone lung toxicity.        DG Chest 2 View

## 2021-11-16 NOTE — Telephone Encounter (Signed)
The patient has been notified of both results and verbalized understanding.  All questions (if any) were answered. Nuala Alpha, LPN 56/94/3700 5:25 PM

## 2021-11-16 NOTE — Telephone Encounter (Signed)
-----   Message from [name], MD sent at 11/16/2021 10:31 AM EST ----- Labs are all in good range.  Continue current medications without change.

## 2021-11-16 NOTE — Telephone Encounter (Signed)
Patient returning call for lab results. 

## 2021-11-18 ENCOUNTER — Telehealth: Payer: Self-pay | Admitting: Pharmacist

## 2021-11-18 NOTE — Chronic Care Management (AMB) (Signed)
° ° °  Chronic Care Management Pharmacy Assistant   Name: Jesus [name]  MRN: [id number] DOB: 06-04-1941  11/23/2021 APPOINTMENT REMINDER  Called [name], No answer, left message of appointment on 11/23/2021 at 1:00 via telephone visit with [name] Pharm D. Notified to have all medications, supplements, blood pressure and/or blood sugar logs available during appointment and to return call if need to reschedule.  Care Gaps: AWV - completed on 06/21/21 COVID-19 vaccine - overdue  Last BP - 130/70 on 11/15/2021    Star Rating Drug: Atorvastatin 80mg  - last filled on 08/28/2021 90DS at [facility] verified with [name] Losartan 100mg  - last filled on 09/16/2021 90DS at [facility]  Any gaps in medications fill history? No  [name] [facility]  Catering manager [phone]

## 2021-11-23 ENCOUNTER — Ambulatory Visit (INDEPENDENT_AMBULATORY_CARE_PROVIDER_SITE_OTHER): Payer: Medicare Other | Admitting: Pharmacist

## 2021-11-23 DIAGNOSIS — I482 Chronic atrial fibrillation, unspecified: Secondary | ICD-10-CM

## 2021-11-23 DIAGNOSIS — I10 Essential (primary) hypertension: Secondary | ICD-10-CM

## 2021-11-23 NOTE — Progress Notes (Signed)
Chronic Care Management Pharmacy Note  11/23/2021 Name:  Jesus [name] MRN:  [id number] DOB:  Jul 25, 1941  Summary: BP is at goal < 130/80 LDL is slightly above goal < 70  Recommendations/Changes made from today's visit: -Recommend repeat lipid panel and consider increasing statin dose to lower LDL to goal -Recommended for pt to bring BP cuff to next office visit to ensure accuracy -Consider grief counseling with social worker  Plan: Follow up BP assessment in 3-4 months  Subjective: Jesus [name] is an 80 y.o. year old male who is a primary patient of [name], [name], MD.  The CCM team was consulted for assistance with disease management and care coordination needs.    Engaged with patient by telephone for follow up visit in response to provider referral for pharmacy case management and/or care coordination services.   Consent to Services:  The patient was given information about Chronic Care Management services, agreed to services, and gave verbal consent prior to initiation of services.  Please see initial visit note for detailed documentation.   Patient Care Team: [name], MD as PCP - General (Family Medicine) [name], MD as PCP - Cardiology (Cardiology) [name], [facility] as Pharmacist (Pharmacist)  Recent office visits: 11/09/21 [name], MD: Patient presented for chronic conditions follow up.  10/11/21 [name], MD: Patient presented for blood in stools. Recommended increasing fiber intake.   09/10/21 [name], MD: Patient presented for easy bruising and HTN follow up.  07/12/21 [name] MD (PCP) - seen for cough. Discontinued azithromycin and hydrocodone. Follow up as needed.    07/05/21 [name] MD (PCP) - seen for bronchitis. Patient started on azithromycin and hydrocodone. No follow up noted.   06/21/21 Randel Pigg, LPN: Patient presented for AWV.  Recent consult visits: 11/15/21 [name], MD  (cardiology): Patient presented for CAD and Afib follow up.   10/21/21 [name], RN ([facility]): Patient presented for anti-coag visit. INR 2.7, goal 2-3. Continued 4 mg (4 mg x 1) every day.  Hospital visits: 12/02/20 Patient presented to the ED for rectal bleeding and multiple contusions.   Objective:  Lab Results  Component Value Date   CREATININE 0.99 11/15/2021   BUN 17 11/15/2021   GFR 104.41 01/21/2015   GFRNONAA >60 12/02/2020   GFRAA 90 11/04/2020   NA 141 11/15/2021   K 4.6 11/15/2021   CALCIUM 9.3 11/15/2021   CO2 25 11/15/2021   GLUCOSE 86 11/15/2021    Lab Results  Component Value Date/Time   HGBA1C 6.0 (H) 03/06/2015 02:45 PM   GFR 104.41 01/21/2015 08:27 AM   GFR 85.12 08/27/2014 01:35 PM    Last diabetic Eye exam: No results found for: HMDIABEYEEXA  Last diabetic Foot exam: No results found for: HMDIABFOOTEX   Lab Results  Component Value Date   CHOL 141 06/02/2021   HDL 51 06/02/2021   LDLCALC 71 06/02/2021   TRIG 101 06/02/2021   CHOLHDL 2.8 06/02/2021    Hepatic Function Latest Ref Rng & Units 11/15/2021 06/02/2021 12/02/2020  Total Protein 6.0 - 8.5 g/dL 6.7 6.7 6.8  Albumin 3.6 - 4.6 g/dL 4.3 4.3 3.8  AST 0 - 40 IU/L _0 ALT 0 - 44 IU/L _1 Alk Phosphatase 44 - 121 IU/L 120 122(H) 103  Total Bilirubin 0.0 - 1.2 mg/dL 0.4 0.5 1.0  Bilirubin, Direct 0.00 - 0.40 mg/dL 0.16 - -    Lab Results  Component Value  Date/Time   TSH 4.320 11/15/2021 08:53 AM   TSH 3.270 06/02/2021 07:32 AM   FREET4 1.29 03/20/2017 02:41 PM   FREET4 0.9 10/19/2009 02:40 PM    CBC Latest Ref Rng & Units 09/10/2021 06/02/2021 12/02/2020  WBC 3.8 - 10.8 Thousand/uL 7.1 6.9 7.4  Hemoglobin 13.2 - 17.1 g/dL 11.8(L) 12.7(L) 12.6(L)  Hematocrit 38.5 - 50.0 % 34.9(L) 37.3(L) 39.3  Platelets 140 - 400 Thousand/uL 146 135(L) 147(L)    No results found for: VD25OH  Clinical ASCVD: Yes  The ASCVD Risk score (Arnett DK, et al., 2019) failed to  calculate for the following reasons:   The 2019 ASCVD risk score is only valid for ages 68 to 4    Depression screen PHQ 2/9 06/21/2021 06/21/2021 01/29/2020  Decreased Interest 0 0 0  Down, Depressed, Hopeless 0 0 0  PHQ - 2 Score 0 0 0  Altered sleeping - - 0  Tired, decreased energy - - 0  Change in appetite - - 0  Feeling bad or failure about yourself  - - 0  Trouble concentrating - - 0  Moving slowly or fidgety/restless - - 0  Suicidal thoughts - - 0  PHQ-9 Score - - 0  Difficult doing work/chores - - Not difficult at all  Some recent data might be hidden     CHA2DS2/VAS Stroke Risk Points  Current as of 3 minutes ago     5 >= 2 Points: High Risk  1 - 1.99 Points: Medium Risk  0 Points: Low Risk    Last Change: N/A      Details    This score determines the patient's risk of having a stroke if the  patient has atrial fibrillation.       Points Metrics  1 Has Congestive Heart Failure:  Yes    Current as of 3 minutes ago  1 Has Vascular Disease:  Yes    Current as of 3 minutes ago  1 Has Hypertension:  Yes    Current as of 3 minutes ago  2 Age:  22    Current as of 3 minutes ago  0 Has Diabetes:  No    Current as of 3 minutes ago  0 Had Stroke:  No  Had TIA:  No  Had Thromboembolism:  No    Current as of 3 minutes ago  0 Male:  No    Current as of 3 minutes ago         Social History   Tobacco Use  Smoking Status Never  Smokeless Tobacco Never  Tobacco Comments   Does not smoke.   BP Readings from Last 3 Encounters:  11/15/21 130/70  11/09/21 138/60  10/11/21 140/60   Pulse Readings from Last 3 Encounters:  11/15/21 67  11/09/21 63  10/11/21 67   Wt Readings from Last 3 Encounters:  11/15/21 132 lb 3.2 oz (60 kg)  11/09/21 133 lb 12.8 oz (60.7 kg)  10/11/21 132 lb 3.2 oz (60 kg)   BMI Readings from Last 3 Encounters:  11/15/21 24.98 kg/m  11/09/21 26.13 kg/m  10/11/21 25.82 kg/m    Assessment/Interventions: Review of patient past  medical history, allergies, medications, health status, including review of consultants reports, laboratory and other test data, was performed as part of comprehensive evaluation and provision of chronic care management services.   SDOH:  (Social Determinants of Health) assessments and interventions performed: No  SDOH Screenings   Alcohol Screen: Low Risk  Last Alcohol Screening Score (AUDIT): 0  Depression (PHQ2-9): Low Risk    PHQ-2 Score: 0  Financial Resource Strain: Low Risk    Difficulty of Paying Living Expenses: Not hard at all  Food Insecurity: No Food Insecurity   Worried About Charity fundraiser in the Last Year: Never true   Ran Out of Food in the Last Year: Never true  Housing: Low Risk    Last Housing Risk Score: 0  Physical Activity: Sufficiently Active   Days of Exercise per Week: 5 days   Minutes of Exercise per Session: 60 min  Social Connections: Moderately Integrated   Frequency of Communication with Friends and Family: Three times a week   Frequency of Social Gatherings with Friends and Family: Three times a week   Attends Religious Services: More than 4 times per year   Active Member of Clubs or Organizations: Yes   Attends Archivist Meetings: More than 4 times per year   Marital Status: Widowed  Stress: No Stress Concern Present   Feeling of Stress : Not at all  Tobacco Use: Low Risk    Smoking Tobacco Use: Never   Smokeless Tobacco Use: Never   Passive Exposure: Not on file  Transportation Needs: No Transportation Needs   Lack of Transportation (Medical): No   Lack of Transportation (Non-Medical): No    CCM Care Plan  Allergies  Allergen Reactions   Codeine Other (See Comments)    Pt does not remember   Iodine Swelling    Medications Reviewed Today     Reviewed by Nickolas Madrid, CMA (Certified Medical Assistant) on 11/15/21 at [place] List Status: <None>   Medication Order Taking? Sig Documenting Provider Last Dose Status  Informant  acetaminophen (TYLENOL) 500 MG tablet [id number] Yes Take 500 mg by mouth every 6 (six) hours as needed for moderate pain. [provider] Taking Active Spouse/Significant Other  albuterol (VENTOLIN HFA) 108 (90 Base) MCG/ACT inhaler [id number] Yes INHALE 2 PUFFS BY MOUTH EVERY 4 HOURS ASNEEDED FOR WHEEZING OR SHORTNESS OF BREATH. [name], MD Taking Active   amiodarone (PACERONE) 200 MG tablet [id number] Yes TAKE 1/2 TABLET BY MOUTH DAILY [name], MD Taking Active   amLODipine (NORVASC) 5 MG tablet [id number] Yes TAKE 1 TABLET BY MOUTH ONCE DAILY [name], MD Taking Active   amoxicillin (AMOXIL) 400 MG/5ML suspension [id number] Yes TAKE 5 TEASPOONFULS BY MOUTH 1 HOUR PRIOR TO DENTAL PROCEDURE [name], [name], MD Taking Active   Ascorbic Acid (VITAMIN C) 1000 MG tablet [id number] Yes Take 1,000 mg by mouth daily. [provider] Taking Active Spouse/Significant Other  aspirin 81 MG tablet [id number] Yes Take 81 mg by mouth daily. [provider] Taking Active Spouse/Significant Other  atorvastatin (LIPITOR) 80 MG tablet [id number] Yes TAKE 1/2 TABLET BY MOUTH DAILY [name], [name], MD Taking Active   loratadine (CLARITIN) 10 MG tablet [id number] Yes Take 10 mg by mouth daily as needed for allergies. [provider] Taking Active Spouse/Significant Other  losartan (COZAAR) 100 MG tablet [id number] Yes Take 1 tablet (100 mg total) by mouth daily. [name], MD Taking Active   Multiple Vitamins-Minerals (MULTIVITAMIN,TX-MINERALS) tablet [id number] Yes Take 1 tablet by mouth daily. [provider] Taking Active Spouse/Significant Other  warfarin (COUMADIN) 4 MG tablet [id number] Yes TAKE 1 TABLET BY MOUTH DAILY OR TAKE AS DIRECTED BY ANTICOAGULATION CLINIC [name], [name], MD Taking Active  Patient Active Problem List   Diagnosis Date Noted   Chronic combined systolic and diastolic CHF (congestive heart  failure) ([place]) 04/16/2018   Long term (current) use of anticoagulants 08/25/2017   Actinic keratoses 03/21/2017   Internal hemorrhoid, bleeding 07/01/2015   Rectal bleeding 06/15/2015   Internal hemorrhoids 06/15/2015   Other constipation 06/15/2015   Chronic anticoagulation-Couamdin 03/12/2015   S/P TAVR (transcatheter aortic valve replacement) 03/10/2015   Anemia 09/03/2014   Encounter for therapeutic drug monitoring 12/31/2013   Yellow jacket sting 08/06/2013   Low back pain on right side with sciatica 04/24/2013   Severe aortic stenosis 08/10/2012   CAD (coronary artery disease) 08/10/2012   Left ventricular dysfunction 08/10/2012   Routine health maintenance 07/12/2012   BEE STING REACTION, LOCAL 09/21/2010   BEN LOC HYPERPLASIA PROS W/UR OBST & OTH LUTS 08/02/2010   Acute and chronic cholecystitis 02/01/2010   Abdominal pain, generalized 01/29/2010   CORONARY ATHEROSCLEROSIS NATIVE CORONARY ARTERY 10/13/2009   HYPERTHYROIDISM 05/22/2009   INSOMNIA 05/22/2009   Hyperlipidemia 04/22/2009   Cardiomyopathy, ischemic-EF 35% 04/22/2009   CAD, AUTOLOGOUS BYPASS GRAFT 10/16/2008   Essential hypertension 09/07/2007   Aortic valve disorder 09/07/2007   Chronic atrial fibrillation ([place]) 09/07/2007   GASTROESOPHAGEAL REFLUX DISEASE 09/07/2007   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    Immunization History  Administered Date(s) Administered   Fluad Quad(high Dose 65+) 10/04/2019, 10/08/2020   Influenza Whole 09/05/2001, 09/26/2008, 09/26/2009, 09/14/2010   Influenza, High Dose Seasonal PF 08/12/2015, 09/02/2016, 08/25/2017, 09/07/2018, 08/24/2021   Influenza,inj,Quad PF,6+ Mos 08/27/2014   Influenza-Unspecified 09/12/2012, 08/16/2013   PFIZER(Purple Top)SARS-COV-2 Vaccination 01/06/2020, 01/20/2020, 11/23/2020   Pneumococcal Conjugate-13 04/07/2014   Pneumococcal Polysaccharide-23 09/05/2001, 01/14/2010   Td 01/06/1999   Tetanus 05/15/2013   Zoster Recombinat (Shingrix) 09/06/2019,  02/19/2020   Zoster, Live 12/14/2011    Patient is trying to keep busy as much as possible. He is still working 5 days a week 7am-12pm and goes to the gym 3 times a week. He also goes by the cemetary every day. He also tries to go to bed early to avoid sitting in bed and thinking for an hour before falling asleep.  Conditions to be addressed/monitored:  Hypertension, Hyperlipidemia, Atrial Fibrillation, Heart Failure, GERD, Osteopenia, Allergic Rhinitis, and Pain  Conditions addressed this visit: Hypertension, Afib, hyperlipidemia   Care Plan : CCM Pharmacy Care Plan  Updates made by [name], [place] since 11/23/2021 12:00 AM     Problem: Problem: Hypertension, Hyperlipidemia, Atrial Fibrillation, Heart Failure, GERD, Osteopenia, Allergic Rhinitis, and Pain      Long-Range Goal: Patient Stated   Start Date: 05/28/2021  Expected End Date: 05/28/2022  Recent Progress: On track  Priority: High  Note:   Current Barriers:  Unable to independently monitor therapeutic efficacy  Pharmacist Clinical Goal(s):  Patient will achieve adherence to monitoring guidelines and medication adherence to achieve therapeutic efficacy through collaboration with PharmD and provider.   Interventions: 1:1 collaboration with [name], MD regarding development and update of comprehensive plan of care as evidenced by provider attestation and co-signature Inter-disciplinary care team collaboration (see longitudinal plan of care) Comprehensive medication review performed; medication list updated in electronic medical record  Hypertension (BP goal <130/80) -Controlled -Current treatment: Amlodipine 5 mg 1 tablet daily - in AM Losartan 100 mg 1 tablet daily - in AM -Medications previously tried: none  -Current home readings: 129/69 (nurse at his house) - does not check but has an arm cuff; did bring his cuff to office  -  Current dietary habits: did not discuss -Current exercise habits: did not  discuss -Denies hypotensive/hypertensive symptoms -Educated on BP goals and benefits of medications for prevention of heart attack, stroke and kidney damage; Importance of home blood pressure monitoring; Proper BP monitoring technique; -Counseled to monitor BP at home weekly, document, and provide log at future appointments -Counseled on diet and exercise extensively Recommended to continue current medication  Hyperlipidemia: (LDL goal < 70) -Not ideally controlled -Current treatment: Atorvastatin 80 mg 1/2 tablet daily -Medications previously tried: none  -Current dietary patterns: did not discuss -Current exercise habits: did not discuss -Educated on Cholesterol goals;  Benefits of statin for ASCVD risk reduction; Importance of limiting foods high in cholesterol; -Counseled on diet and exercise extensively Recommended to continue current medication Recommended repeat lipid panel and may consider dose increase to lower LDL to goal < 70.  CAD (Goal: prevent heart events) -Controlled -Current treatment  Aspirin 81 mg 1 tablet daily Atorvastatin 80 mg 1/2 tablet daily -Medications previously tried: none  -Recommended to continue current medication  Atrial Fibrillation (Goal: prevent stroke and major bleeding) -Controlled -CHADSVASC: 4 -Current treatment: Rhythm control: Amiodarone 200 mg 1/2 tablet daily Anticoagulation: Warfarin 4 mg daily -Medications previously tried: none -Home BP and HR readings: does not check (HR 60-70s) -Counseled on increased risk of stroke due to Afib and benefits of anticoagulation for stroke prevention; importance of adherence to anticoagulant exactly as prescribed; -Counseled on diet and exercise extensively Recommended to continue current medication Educated on foods that can affect INR  Heart Failure (Goal: manage symptoms and prevent exacerbations) -Controlled -Last ejection fraction: 40-45% (Date: 05/01/20) -HF type: Left Ventricular  Failure -NYHA Class: II (slight limitation of activity) -AHA HF Stage: B (Heart disease present - no symptoms present) -Current treatment: Losartan 100 mg 1 tablet daily Amiodarone 200 mg 1/2 tablet daily -Medications previously tried: none  -Current home BP/HR readings: does not check -Current dietary habits: did not discuss -Current exercise habits: did not discuss -Educated on Benefits of medications for managing symptoms and prolonging life Importance of blood pressure control -Counseled on diet and exercise extensively Recommended to continue current medication  Osteopenia (Goal prevent fractures) -Not ideally controlled -Last DEXA Scan: has not completed (noted on Xray)   T-Score femoral neck: n/a  T-Score total hip: n/a  T-Score lumbar spine: n/a  T-Score forearm radius: n/a  10-year probability of major osteoporotic fracture: n/a  10-year probability of hip fracture: n/a -Patient is not a candidate for pharmacologic treatment -Current treatment  No medications -Medications previously tried: none  -Recommend [phone] units of vitamin D daily. Recommend 1200 mg of calcium daily from dietary and supplemental sources. Recommend weight-bearing and muscle strengthening exercises for building and maintaining bone density. - Consider DEXA scan  GERD (Goal: minimize symptoms) -Controlled -Current treatment  Tums as needed Pepcid 20 mg as needed Zantac 10 mg as needed -Medications previously tried: none  - Patient alternates Pepcid and Zantac  Allergic rhinitis (Goal: minimize symptoms) -Controlled -Current treatment  Claritin 10 mg 1 tablet daily as needed - patient takes about 3 x month -Medications previously tried: none  -Recommended to continue current medication  Pain (Goal: minimize pain) -Controlled -Current treatment  Tylenol 500 mg daily (patient takes 2 x month) -Medications previously tried: none  -Counseled on avoiding NSAIDs for pain while taking  anticoagulants; maximum daily dose of Tylenol of 3,000 mg/day   Health Maintenance -Vaccine gaps: COVID booster -Current therapy:  Amoxicillin 500 mg 4 capsules Amoxicillin suspension  Multivitamin 1 tablet daily Vitamin C 1000 mg 1 tablet daily -Educated on Cost vs benefit of each product must be carefully weighed by individual consumer -Patient is satisfied with current therapy and denies issues -Recommended to continue current medication  Patient Goals/Self-Care Activities Patient will:  - take medications as prescribed check blood pressure weekly, document, and provide at future appointments  Follow Up Plan: Telephone follow up appointment with care management team member scheduled for: 1 year       Medication Assistance: None required.  Patient affirms current coverage meets needs.  Compliance/Adherence/Medication fill history: Care Gaps: COVID booster Last BP - 130/70 on 11/15/2021  Star-Rating Drugs: Atorvastatin 62m - last filled on [facility]28/2021 [facility] [place] [place]- [facility]/16/2021 [facility]  Patient's preferred [facility] is:  [place]: [phone]: [phone]  [id number] [place] [address] [place] [id number]: [phone]: [phone] pill box? Yes - 1 week at a time; morning, lunch, and supper (3 different pill boxes) Pt endorses 95% compliance - sometimes misses aspirin and vitamin C  We discussed: Current pharmacy is preferred with insurance plan and patient is satisfied with pharmacy services Patient decided to: Continue current medication management strategy  Care Plan and Follow Up Patient Decision:  Patient agrees to Care Plan and Follow-up.  Plan: Telephone follow up appointment with care management team member scheduled for:  1  year  MadJeni SallesharmD, BCAMahtowaarmacist Le[place] [employer] [phone]

## 2021-11-25 ENCOUNTER — Ambulatory Visit: Payer: Self-pay | Admitting: Licensed Clinical Social Worker

## 2021-11-25 DIAGNOSIS — E785 Hyperlipidemia, unspecified: Secondary | ICD-10-CM

## 2021-11-25 DIAGNOSIS — I482 Chronic atrial fibrillation, unspecified: Secondary | ICD-10-CM

## 2021-11-25 DIAGNOSIS — I2510 Atherosclerotic heart disease of native coronary artery without angina pectoris: Secondary | ICD-10-CM

## 2021-11-25 DIAGNOSIS — I10 Essential (primary) hypertension: Secondary | ICD-10-CM

## 2021-11-25 DIAGNOSIS — F4321 Adjustment disorder with depressed mood: Secondary | ICD-10-CM

## 2021-11-30 ENCOUNTER — Ambulatory Visit: Payer: Self-pay | Admitting: Licensed Clinical Social Worker

## 2021-11-30 DIAGNOSIS — E785 Hyperlipidemia, unspecified: Secondary | ICD-10-CM

## 2021-11-30 DIAGNOSIS — I482 Chronic atrial fibrillation, unspecified: Secondary | ICD-10-CM

## 2021-11-30 DIAGNOSIS — F4321 Adjustment disorder with depressed mood: Secondary | ICD-10-CM

## 2021-11-30 DIAGNOSIS — I2510 Atherosclerotic heart disease of native coronary artery without angina pectoris: Secondary | ICD-10-CM

## 2021-12-01 NOTE — Chronic Care Management (AMB) (Signed)
° °   Clinical Social Work  Care Management   Phone Outreach    12/01/2021 Name: [name] MRN: [id number] DOB: [birth year]  Jesus [name] is a 80 y.o. year old male who is a primary care patient of [name], [name], MD .   Reason for referral: Grief Counseling.    CCM LCSW reached out to patient today by phone to introduce self, assess needs and offer Care Management services and interventions.     Plan:Appointment was scheduled with CCM LCSW for 11/30/22  Review of patient status, including review of consultants reports, relevant laboratory and other test results, and collaboration with appropriate care team members and the patient's provider was performed as part of comprehensive patient evaluation and provision of care management services.    Christa See, MSW, LCSW Financial controller at [email or web address] Phone [phone] 8:50 AM

## 2021-12-02 ENCOUNTER — Ambulatory Visit (INDEPENDENT_AMBULATORY_CARE_PROVIDER_SITE_OTHER): Payer: Medicare Other

## 2021-12-02 ENCOUNTER — Other Ambulatory Visit: Payer: Self-pay

## 2021-12-02 DIAGNOSIS — Z7901 Long term (current) use of anticoagulants: Secondary | ICD-10-CM

## 2021-12-02 LAB — POCT INR: INR: 1.7 — AB (ref 2.0–3.0)

## 2021-12-02 NOTE — Chronic Care Management (AMB) (Signed)
Chronic Care Management    Clinical Social Work Note  12/02/2021 Name: Jesus [name] MRN: [id number] DOB: 1941/09/30  Jesus [name] is a 80 y.o. year old male who is a primary care patient of [name], Jesus Sierras, MD. The CCM team was consulted to assist the patient with chronic disease management and/or care coordination needs related to: Grief Counseling.   Engaged with patient by telephone for initial visit in response to provider referral for social work chronic care management and care coordination services.   Consent to Services:  The patient was given the following information about Chronic Care Management services today, agreed to services, and gave verbal consent: 1. CCM service includes personalized support from designated clinical staff supervised by the primary care provider, including individualized plan of care and coordination with other care providers 2. 24/7 contact phone numbers for assistance for urgent and routine care needs. 3. Service will only be billed when office clinical staff spend 20 minutes or more in a month to coordinate care. 4. Only one practitioner may furnish and bill the service in a calendar month. 5.The patient may stop CCM services at any time (effective at the end of the month) by phone call to the office staff. 6. The patient will be responsible for cost sharing (co-pay) of up to 20% of the service fee (after annual deductible is met). Patient agreed to services and consent obtained.  Patient agreed to services and consent obtained.   Consent to Services:  The patient was given information about Care Management services, agreed to services, and gave verbal consent prior to initiation of services.  Please see initial visit note for detailed documentation.   Patient agreed to services today and consent obtained.  Engaged with patient by phone in response to provider referral for social work care coordination services:  Assessment/Interventions: Assessed  patient's previous and current treatment, coping skills, support system and barriers to care. Patient was successful in identifying healthy coping skills to assist with management of symptoms. He receives strong support and continues to manage independency in the community. See Care Plan below for interventions and patient self-care activities.  Recent life changes or stressors: Grief  Recommendation: Patient may benefit from, and is in agreement work with LCSW to address care coordination needs and will continue to work with the clinical team to address health care and disease management related needs.   Follow up Plan: Patient would like continued follow-up from CCM LCSW.  per patient's request will follow up in 10-12 weeks.  Will call office if needed prior to next encounter.    SDOH (Social Determinants of Health) assessments and interventions performed:    Advanced Directives Status: Not addressed in this encounter.  CCM Care Plan  Allergies  Allergen Reactions   Codeine Other (See Comments)    Pt does not remember   Iodine Swelling    Outpatient Encounter Medications as of 11/30/2021  Medication Sig   acetaminophen (TYLENOL) 500 MG tablet Take 500 mg by mouth every 6 (six) hours as needed for moderate pain.   albuterol (VENTOLIN HFA) 108 (90 Base) MCG/ACT inhaler INHALE 2 PUFFS BY MOUTH EVERY 4 HOURS ASNEEDED FOR WHEEZING OR SHORTNESS OF BREATH.   amiodarone (PACERONE) 200 MG tablet TAKE 1/2 TABLET BY MOUTH DAILY   amLODipine (NORVASC) 5 MG tablet TAKE 1 TABLET BY MOUTH ONCE DAILY   amoxicillin (AMOXIL) 400 MG/5ML suspension TAKE 5 TEASPOONFULS BY MOUTH 1 HOUR PRIOR TO DENTAL PROCEDURE   Ascorbic Acid (VITAMIN  C) 1000 MG tablet Take 1,000 mg by mouth daily.   aspirin 81 MG tablet Take 81 mg by mouth daily.   atorvastatin (LIPITOR) 80 MG tablet TAKE 1/2 TABLET BY MOUTH DAILY   loratadine (CLARITIN) 10 MG tablet Take 10 mg by mouth daily as needed for allergies.   losartan  (COZAAR) 100 MG tablet Take 1 tablet (100 mg total) by mouth daily.   Multiple Vitamins-Minerals (MULTIVITAMIN,TX-MINERALS) tablet Take 1 tablet by mouth daily.   warfarin (COUMADIN) 4 MG tablet TAKE 1 TABLET BY MOUTH DAILY OR TAKE AS DIRECTED BY ANTICOAGULATION CLINIC   No facility-administered encounter medications on file as of 11/30/2021.    Patient Active Problem List   Diagnosis Date Noted   Chronic combined systolic and diastolic CHF (congestive heart failure) ([place]) 04/16/2018   Long term (current) use of anticoagulants 08/25/2017   Actinic keratoses 03/21/2017   Internal hemorrhoid, bleeding 07/01/2015   Rectal bleeding 06/15/2015   Internal hemorrhoids 06/15/2015   Other constipation 06/15/2015   Chronic anticoagulation-Couamdin 03/12/2015   S/P TAVR (transcatheter aortic valve replacement) 03/10/2015   Anemia 09/03/2014   Encounter for therapeutic drug monitoring 12/31/2013   Yellow jacket sting 08/06/2013   Low back pain on right side with sciatica 04/24/2013   Severe aortic stenosis 08/10/2012   CAD (coronary artery disease) 08/10/2012   Left ventricular dysfunction 08/10/2012   Routine health maintenance 07/12/2012   BEE STING REACTION, LOCAL 09/21/2010   BEN LOC HYPERPLASIA PROS W/UR OBST & OTH LUTS 08/02/2010   Acute and chronic cholecystitis 02/01/2010   Abdominal pain, generalized 01/29/2010   CORONARY ATHEROSCLEROSIS NATIVE CORONARY ARTERY 10/13/2009   HYPERTHYROIDISM 05/22/2009   INSOMNIA 05/22/2009   Hyperlipidemia 04/22/2009   Cardiomyopathy, ischemic-EF 35% 04/22/2009   CAD, AUTOLOGOUS BYPASS GRAFT 10/16/2008   Essential hypertension 09/07/2007   Aortic valve disorder 09/07/2007   Chronic atrial fibrillation ([place]) 09/07/2007   GASTROESOPHAGEAL REFLUX DISEASE 09/07/2007   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    Conditions to be addressed/monitored: CHF, CAD, and HTN;  Grief  Care Plan : LCSW Plan of Care  Updates made by Rebekah Chesterfield, LCSW since 12/02/2021  12:00 AM     Problem: Coping Skills (General Plan of Care)      Long-Range Goal: Coping Skills Enhanced   Start Date: 11/30/2021  This Visit's Progress: On track  Note:   Current barriers:   Acute Mental Health needs related to Grief Needs Support, Education, and Care Coordination in order to meet unmet mental health needs. Clinical Goal(s): patient will work with SW to address concerns related to Grief management   Clinical Interventions:  Assessed patient's previous and current treatment, coping skills, support system and barriers to care  Patient reports spouse passed six months ago (05/07/21), three weeks after the passing of his brother. Since then, patient has lost two close neighbors Patient visits spouse daily and shared talking with her is helpful. He reports staying busy with work, going to the gym, and running errands Patient receives support from sister in Sports coach and co-workers. He is a member to multiple organizations  Patient denies any resource needs Pt was successful in identifying healthy coping skills to assist with promoting mood (watching tv and reading scripture) Mindfulness or Relaxation training provided Active listening / Reflection utilized  Emotional Support Provided Provided psychoeducation for mental health needs  Quality of sleep assessed & Sleep Hygiene techniques promoted  Participation in counseling encouraged  Participation in support group encouraged  Verbalization of feelings encouraged  ;  Review resources, discussed options and provided patient information about  [facility] 1:1 collaboration with primary care provider regarding development and update of comprehensive plan of care as evidenced by provider attestation and co-signature Inter-disciplinary care team collaboration (see longitudinal plan of care) Patient Goals/Self-Care Activities: Over the next 120 days Attend scheduled appointments with providers Utilize healthy coping skills  and/or resources discussed Contact PCP office with any questions or concerns         Christa See, MSW, Surveyor, minerals at [email or web address] Phone [phone] 8:44 AM

## 2021-12-02 NOTE — Patient Instructions (Addendum)
Pre visit review using our clinic review tool, if applicable. No additional management support is needed unless otherwise documented below in the visit note.  Increase dose today and tomorrow to take 1 1/2  (6 mg) tablets and then continue to take 1 (4 mg) tablet daily.  Re-check in 4 weeks

## 2021-12-02 NOTE — Progress Notes (Signed)
Patient ID: Jesus [name], male   DOB: 1941-04-20, 80 y.o.   MRN: [id number]  Medical screening examination/treatment/procedure(s) were performed by non-physician practitioner and as supervising physician I was immediately available for consultation/collaboration.  I agree with above. Cathlean Cower, MD

## 2021-12-02 NOTE — Progress Notes (Signed)
Increase dose today and tomorrow to take 1 1/2  (6 mg) tablets and then continue to take 1 (4 mg) tablet daily.  Re-check in 4 weeks

## 2021-12-02 NOTE — Patient Instructions (Signed)
Visit Information  Thank you for taking time to visit with me today. Please don't hesitate to contact me if I can be of assistance to you before our next scheduled telephone appointment.  Following are the goals we discussed today:  Patient Goals/Self-Care Activities: Over the next 120 days Attend scheduled appointments with providers Utilize healthy coping skills and/or resources discussed Contact PCP office with any questions or concerns  Our next appointment will be scheduled in 12 weeks  Please call the care guide team at [phone] if you need to cancel or reschedule your appointment.   If you are experiencing a Mental Health or [place] or need someone to talk to, please call the Suicide and Crisis Lifeline: 988 call 911   Following is a copy of your full plan of care:  Care Plan : LCSW Plan of Care  Updates made by Rebekah Chesterfield, LCSW since 12/02/2021 12:00 AM     Problem: Coping Skills (General Plan of Care)      Long-Range Goal: Coping Skills Enhanced   Start Date: 11/30/2021  This Visit's Progress: On track  Note:   Current barriers:   Acute Mental Health needs related to Grief Needs Support, Education, and Care Coordination in order to meet unmet mental health needs. Clinical Goal(s): patient will work with SW to address concerns related to Grief management   Clinical Interventions:  Assessed patient's previous and current treatment, coping skills, support system and barriers to care  Patient reports spouse passed six months ago (05/07/21), three weeks after the passing of his brother. Since then, patient has lost two close neighbors Patient visits spouse daily and shared talking with her is helpful. He reports staying busy with work, going to the gym, and running errands Patient receives support from sister in Sports coach and co-workers. He is a member to multiple organizations  Patient denies any resource needs Pt was successful in identifying healthy  coping skills to assist with promoting mood (watching tv and reading scripture) Mindfulness or Relaxation training provided Active listening / Reflection utilized  Emotional Support Provided Provided psychoeducation for mental health needs  Quality of sleep assessed & Sleep Hygiene techniques promoted  Participation in counseling encouraged  Participation in support group encouraged  Verbalization of feelings encouraged  ; Review resources, discussed options and provided patient information about  [facility] 1:1 collaboration with primary care provider regarding development and update of comprehensive plan of care as evidenced by provider attestation and co-signature Inter-disciplinary care team collaboration (see longitudinal plan of care) Patient Goals/Self-Care Activities: Over the next 120 days Attend scheduled appointments with providers Utilize healthy coping skills and/or resources discussed Contact PCP office with any questions or concerns        Mr. Barg was given information about Care Management services by the embedded care coordination team including:  Care Management services include personalized support from designated clinical staff supervised by his physician, including individualized plan of care and coordination with other care providers 24/7 contact phone numbers for assistance for urgent and routine care needs. The patient may stop CCM services at any time (effective at the end of the month) by phone call to the office staff.  Patient agreed to services and verbal consent obtained.   Patient verbalizes understanding of instructions provided today.  Christa See, MSW, LCSW Financial controller at [email or web address] Phone [phone] 8:41 AM

## 2021-12-02 NOTE — Progress Notes (Signed)
Scheduled for 02/16/22  Thank you   [place] Management  Direct Dial: [phone]

## 2021-12-04 ENCOUNTER — Other Ambulatory Visit: Payer: Self-pay | Admitting: Family Medicine

## 2021-12-04 DIAGNOSIS — I10 Essential (primary) hypertension: Secondary | ICD-10-CM

## 2021-12-04 DIAGNOSIS — E785 Hyperlipidemia, unspecified: Secondary | ICD-10-CM | POA: Diagnosis not present

## 2021-12-04 DIAGNOSIS — F4321 Adjustment disorder with depressed mood: Secondary | ICD-10-CM

## 2021-12-04 DIAGNOSIS — I482 Chronic atrial fibrillation, unspecified: Secondary | ICD-10-CM

## 2021-12-04 DIAGNOSIS — I2510 Atherosclerotic heart disease of native coronary artery without angina pectoris: Secondary | ICD-10-CM

## 2021-12-21 ENCOUNTER — Other Ambulatory Visit: Payer: Self-pay | Admitting: Family Medicine

## 2021-12-22 ENCOUNTER — Telehealth: Payer: Self-pay | Admitting: *Deleted

## 2021-12-22 NOTE — Chronic Care Management (AMB) (Signed)
°  Care Management   Note  12/22/2021 Name: Jesus [name] MRN: [id number] DOB: 04-Jul-1941  Jesus [name] is a 81 y.o. year old male who is a primary care patient of [name], [name], MD and is actively engaged with the care management team. I reached out to [name] by phone today to assist with re-scheduling a follow up visit with the Licensed Clinical Social Worker  Follow up plan: Unsuccessful telephone outreach attempt made. A HIPAA compliant phone message was left for the patient providing contact information and requesting a return call.  The care management team will reach out to the patient again over the next 7 days.  If patient returns call to provider office, please advise to call [place] at [phone].  [place] Management  Direct Dial: [phone]

## 2021-12-27 ENCOUNTER — Other Ambulatory Visit: Payer: Self-pay | Admitting: Family Medicine

## 2021-12-27 NOTE — Chronic Care Management (AMB) (Signed)
°  Care Management   Note  12/27/2021 Name: DARIC KOREN MRN: [id number] DOB: 1941/03/08  Jesus [name] is a 81 y.o. year old male who is a primary care patient of [name], [name], MD and is actively engaged with the care management team. I reached out to [name] by phone today to assist with re-scheduling a follow up visit with the Licensed Clinical Social Worker  Follow up plan: Unsuccessful telephone outreach attempt made. A HIPAA compliant phone message was left for the patient providing contact information and requesting a return call.  The care management team will reach out to the patient again over the next 7 days.  If patient returns call to provider office, please advise to call [place] at [phone].  [place] Management  Direct Dial: [phone]

## 2021-12-30 ENCOUNTER — Other Ambulatory Visit: Payer: Self-pay

## 2021-12-30 ENCOUNTER — Ambulatory Visit (INDEPENDENT_AMBULATORY_CARE_PROVIDER_SITE_OTHER): Payer: Medicare Other

## 2021-12-30 DIAGNOSIS — Z7901 Long term (current) use of anticoagulants: Secondary | ICD-10-CM | POA: Diagnosis not present

## 2021-12-30 LAB — POCT INR: INR: 2.4 (ref 2.0–3.0)

## 2021-12-30 NOTE — Progress Notes (Signed)
Continue to take 1 (4 mg) tablet daily.  Re-check in 5 weeks.

## 2021-12-30 NOTE — Patient Instructions (Addendum)
Pre visit review using our clinic review tool, if applicable. No additional management support is needed unless otherwise documented below in the visit note.  Continue to take 1 (4 mg) tablet daily.  Re-check in 5 weeks.

## 2022-01-02 IMAGING — DX DG CHEST 2V
2 series · 2 of 2 positions shown · non-contrast
Comparison: 11/04/2019

CLINICAL DATA: 80-year-old male with amiodarone history

EXAM:
CHEST - 2 VIEW

[dg chest 2 view (1 of 2)]
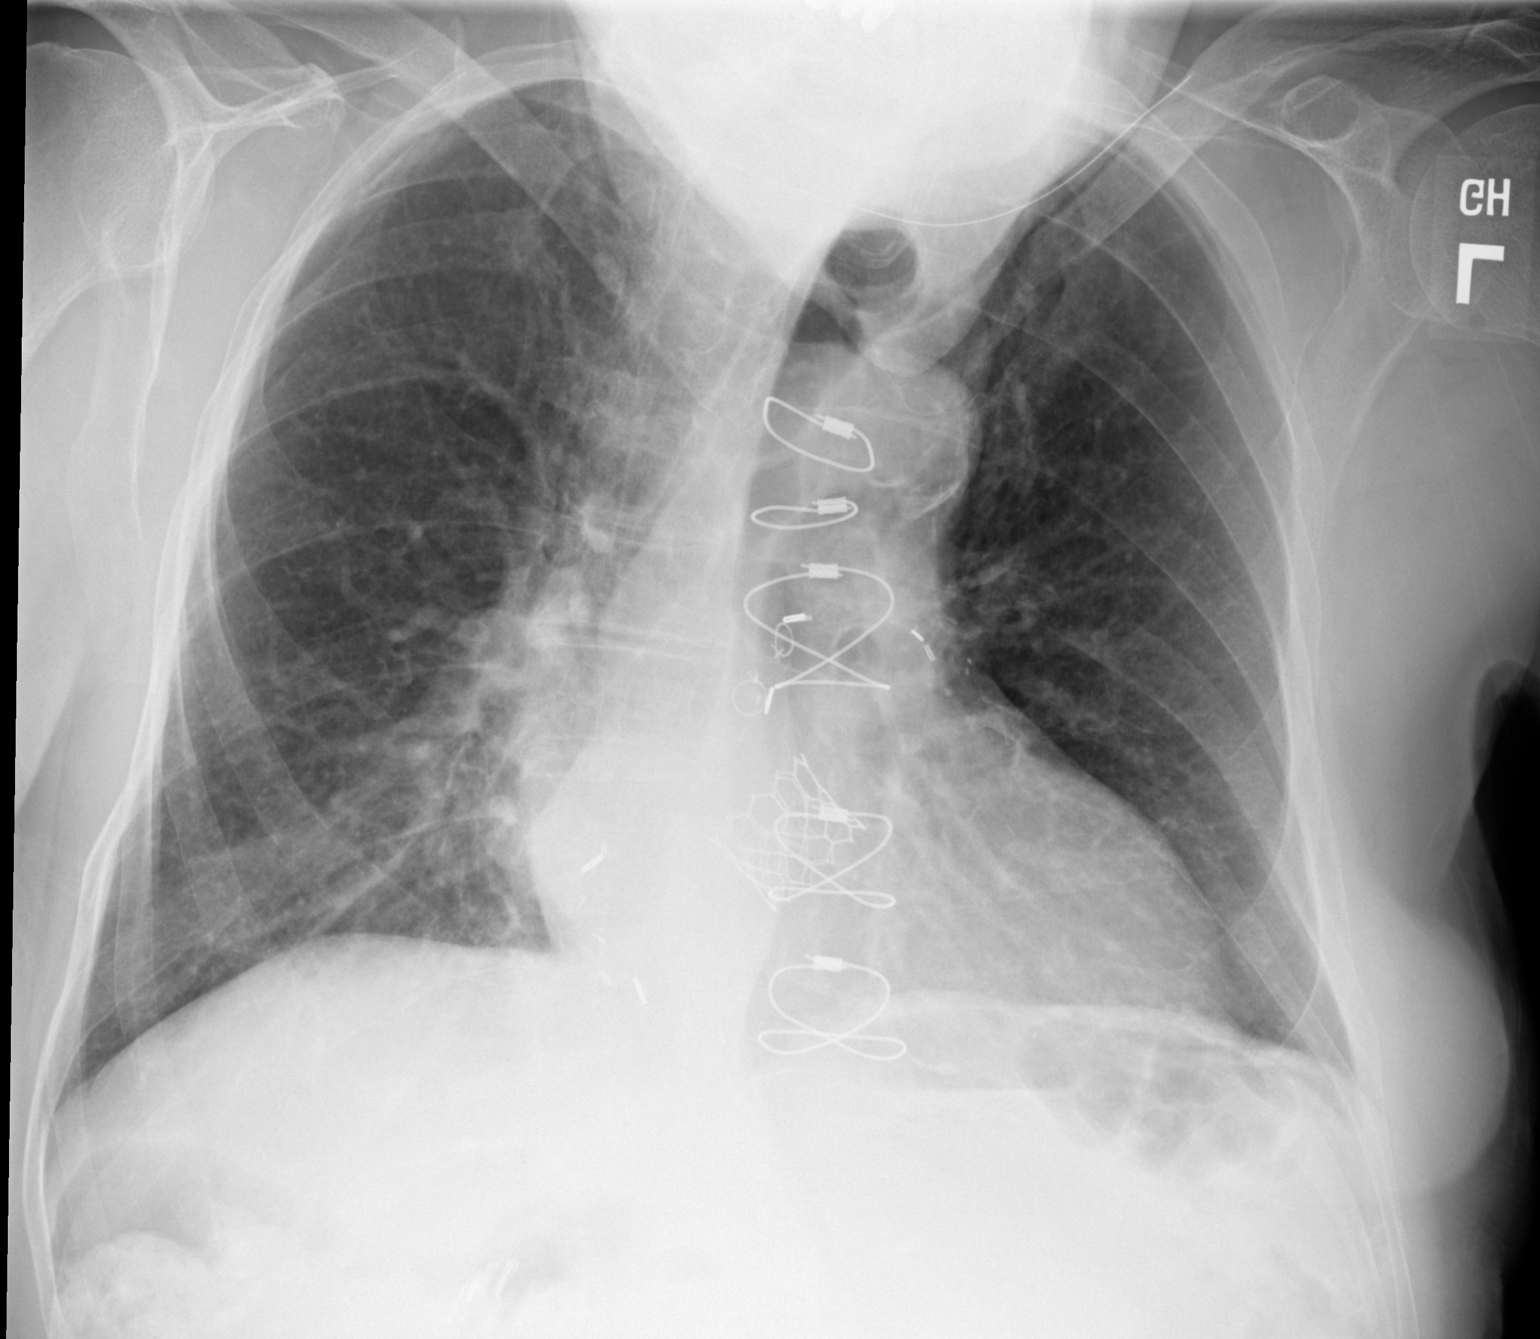

[dg chest 2 view (2 of 2)]
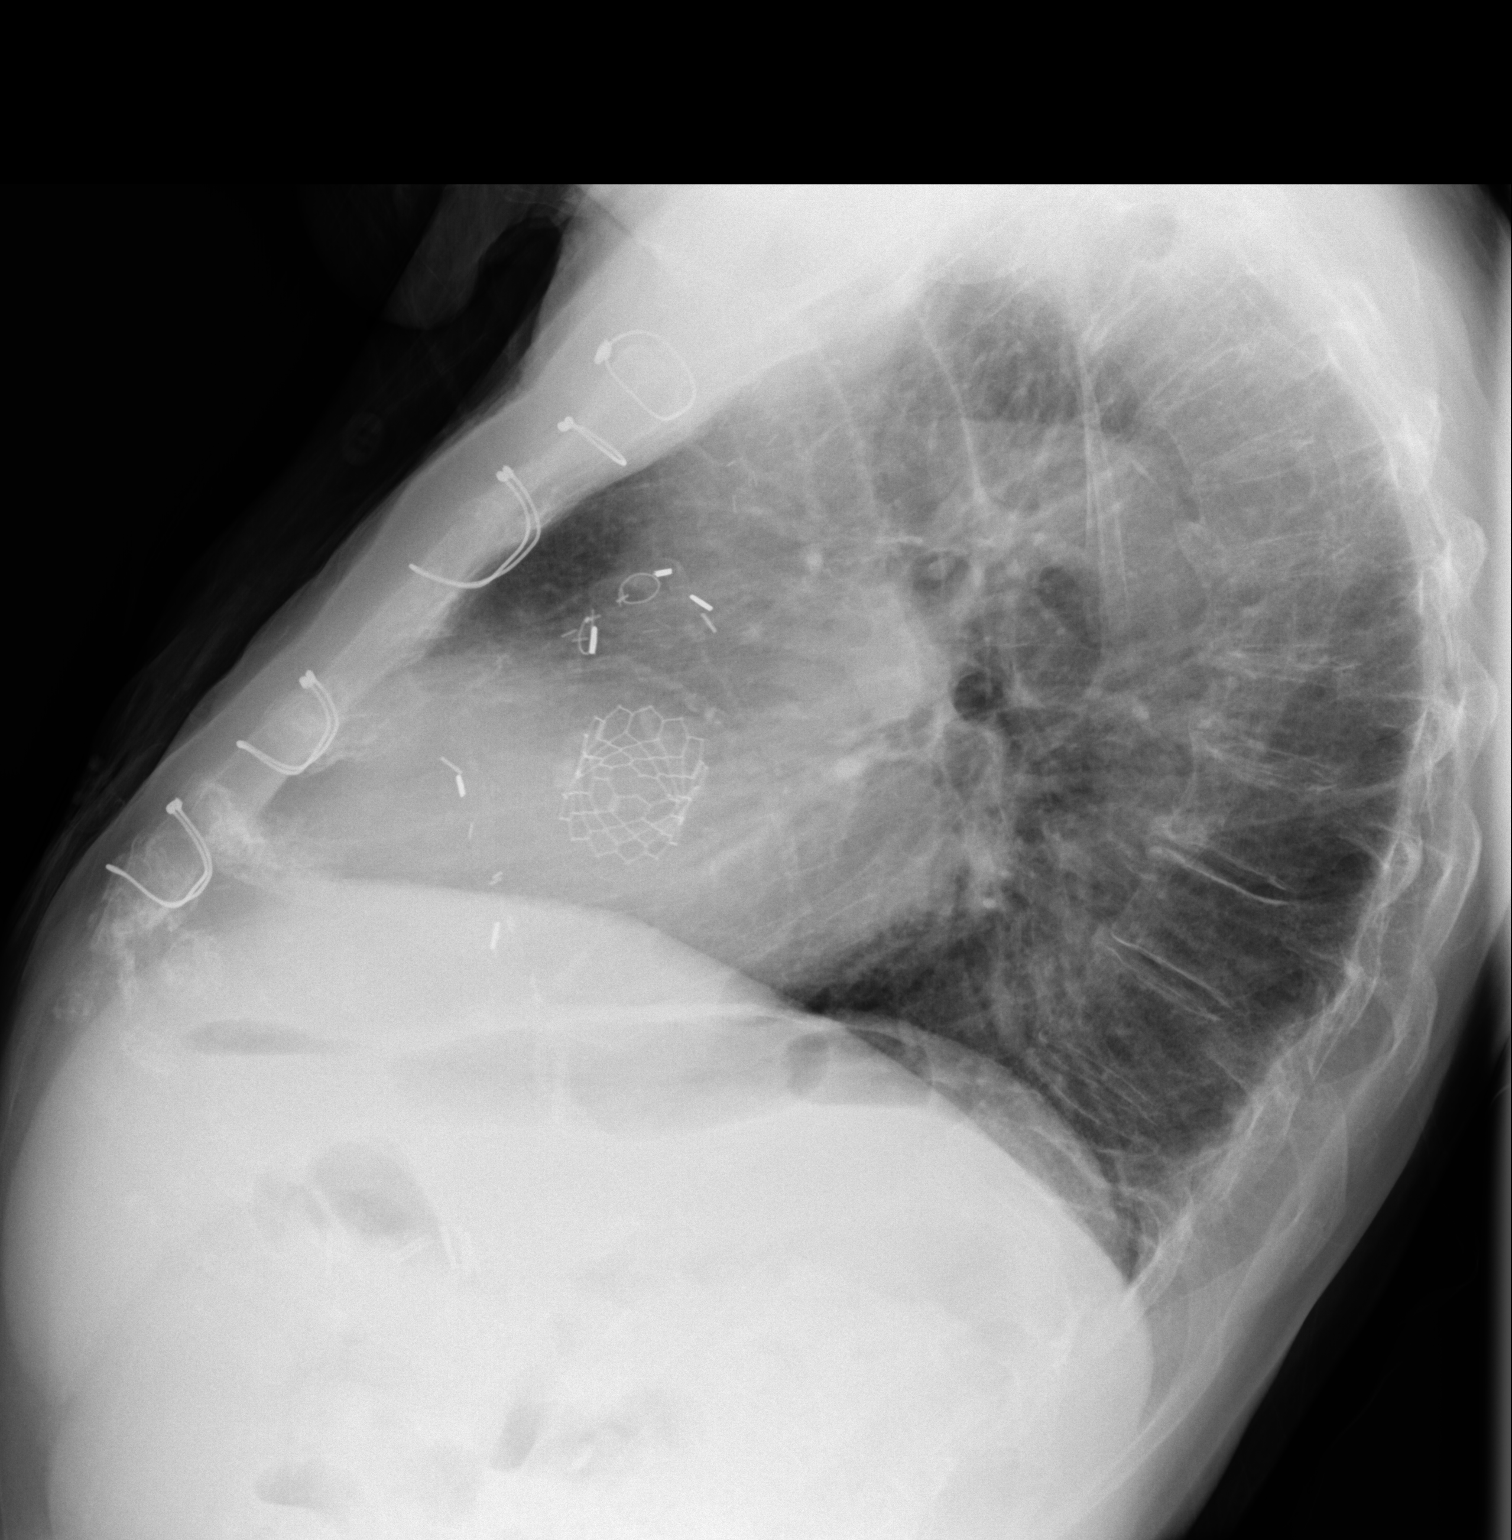

[2 of 2 positions shown; findings below may reference images not displayed]

FINDINGS: Cardiomediastinal silhouette unchanged in size and contour. No
evidence of central vascular congestion. No interlobular septal
thickening.

Surgical changes of median sternotomy, CABG, TAVR.

Overlying soft tissues at the upper chest somewhat limits evaluation
at the lung apices.

Mild coarsened interstitial markings with no significant fibrotic
changes or airspace disease.

No pneumothorax or pleural effusion.

No acute displaced fracture. Degenerative changes of the spine.
IMPRESSION: Negative for acute cardiopulmonary disease.

No significant changes of lung fibrosis.

Surgical changes of median sternotomy, CABG, TAVR

## 2022-01-05 NOTE — Chronic Care Management (AMB) (Signed)
°  Care Management   Note  01/05/2022 Name: Jesus [name] MRN: [id number] DOB: [birth year]  Jesus [name] is a 81 y.o. year old male who is a primary care patient of [name], [name], MD and is actively engaged with the care management team. I reached out to [name] by phone today to assist with re-scheduling a follow up visit with the Licensed Clinical Social Worker  Follow up plan: A third unsuccessful telephone outreach attempt made. A HIPAA compliant phone message was left for the patient providing contact information and requesting a return call. Unable to make contact on outreach attempts x 3. PCP [name], [name], MD notified via routed documentation in medical record. We have been unable to make contact with the patient for follow up. The care management team is available to follow up with the patient after provider conversation with the patient regarding recommendation for care management engagement and subsequent re-referral to the care management team. If patient returns call to provider office, please advise to call [place] at [phone].  [place] Management  Direct Dial: [phone]

## 2022-01-06 ENCOUNTER — Telehealth: Payer: Self-pay | Admitting: Cardiovascular Disease

## 2022-01-06 DIAGNOSIS — I480 Paroxysmal atrial fibrillation: Secondary | ICD-10-CM

## 2022-01-06 DIAGNOSIS — R002 Palpitations: Secondary | ICD-10-CM

## 2022-01-06 DIAGNOSIS — I493 Ventricular premature depolarization: Secondary | ICD-10-CM | POA: Diagnosis not present

## 2022-01-06 NOTE — Telephone Encounter (Signed)
New Message:    Patient said he just left [facility] in [place]. He said the doctor  wants him to see his Cardiologist fir an irregular heart beats.   Patient c/o Palpitations:  High priority if patient c/o lightheadedness, shortness of breath, or chest pain  How long have you had palpitations/irregular HR/ Afib? Are you having the symptoms now? 17 beats every 15 secondsc  1  Are you currently experiencing lightheadedness, SOB or CP? no  Do you have a history of afib (atrial fibrillation) or irregular heart rhythm?   Have you checked your BP or HR? (document readings if available): good  Are you experiencing any other symptoms? little nervous

## 2022-01-06 NOTE — Telephone Encounter (Signed)
Called and spoke to patient. He states that he was at gym this morning and saw that his HR was 80-90s and it is normally in the 60s. He states that he went to be seen at [facility] in [place]. He states that he did not have any Sx but was just concerned about his HR elevating. He states that he has been really nervous because he has had many of his friends and family members pass away over the last several months. He states that they did an EKG that showed some PVCs, HR 61 bpm. He could not tell me his BP. He states that they told him to follow up with cardiology if he developed Sx. Patient is asymptomatic at this time. He is taking amio 100 mg QD and is on coumadin.   Called and requested notes/EKG from Urgent Care visit be faxed to our office for Dr. [name] to review. Instructed for patient to continue to monitor and let us know if he develops any Sx.

## 2022-01-07 ENCOUNTER — Other Ambulatory Visit: Payer: Self-pay | Admitting: Family Medicine

## 2022-01-07 ENCOUNTER — Telehealth: Payer: Self-pay | Admitting: Cardiovascular Disease

## 2022-01-07 DIAGNOSIS — Z7901 Long term (current) use of anticoagulants: Secondary | ICD-10-CM

## 2022-01-07 NOTE — Telephone Encounter (Signed)
[facility] to follow up on notes/EKG that were supposed to be faxed that have not come. They states that the patient was actually seen in their sister office that I would have to call them to fax over. Called them at [phone]. Spoke with medical records and they will fax over records now.

## 2022-01-07 NOTE — Telephone Encounter (Signed)
We will review records from urgent care when available.  The patient is anticoagulated so he should not be at high risk of thromboembolism from atrial fibrillation.  Agree with reassurance and again we will review his records as soon as they are sent over.

## 2022-01-07 NOTE — Telephone Encounter (Signed)
Patient c/o Palpitations:  High priority if patient c/o lightheadedness, shortness of breath, or chest pain  How long have you had palpitations/irregular HR/ Afib? Are you having the symptoms now? 2 days  Are you currently experiencing lightheadedness, SOB or CP? No   Do you have a history of afib (atrial fibrillation) or irregular heart rhythm? Yes   Have you checked your BP or HR? (document readings if available): 66  Are you experiencing any other symptoms? No

## 2022-01-07 NOTE — Telephone Encounter (Signed)
Called patient back about his message. Patient stated his symptoms have not changed from yesterday. Informed patient that we are waiting on notes from the urgent care he went to yesterday. Patient denies any SOB or chest pain. Informed patient that we will discuss with Dr. [name] and see what his EKG showed from urgent care. Patient stated he is worried since he has lost his wife, brother and good friend. Encouraged patient to keep going to the gym that it will help with his stress. Informed patient that we will call him back with Dr. Antionette Char response.

## 2022-01-07 NOTE — Telephone Encounter (Signed)
Called patient back to let him know we have not received the fax from urgent care. Informed patient since he has the paper work and EKG with him to bring it to the office on Monday so Dr. [name] can look at it. Encouraged patient to call our office if he has worsening of symptoms. Patient agreed to plan.

## 2022-01-10 ENCOUNTER — Telehealth (HOSPITAL_COMMUNITY): Payer: Self-pay | Admitting: Family Medicine

## 2022-01-10 ENCOUNTER — Ambulatory Visit (HOSPITAL_COMMUNITY)
Admission: EM | Admit: 2022-01-10 | Discharge: 2022-01-10 | Disposition: A | Discharge status: Home / Self Care | Payer: Medicare Other | Attending: Family Medicine | Admitting: Family Medicine

## 2022-01-10 ENCOUNTER — Other Ambulatory Visit: Payer: Self-pay

## 2022-01-10 ENCOUNTER — Encounter (HOSPITAL_COMMUNITY): Payer: Self-pay

## 2022-01-10 DIAGNOSIS — I480 Paroxysmal atrial fibrillation: Secondary | ICD-10-CM

## 2022-01-10 DIAGNOSIS — R002 Palpitations: Secondary | ICD-10-CM | POA: Diagnosis not present

## 2022-01-10 NOTE — ED Provider Notes (Signed)
[place]    CSN: [id number] Arrival date & time: 01/10/22  0805      History   Chief Complaint Chief Complaint  Patient presents with   Irregular Heart Beat    HPI Jesus [name] is a 81 y.o. male.   HPI Here for feeling his heart rhythm be irregular, feeling palpitations. No cp or dyspnea. Not really much edema. He was seen at another [facility] 2/2, and had NSR with PVC's on that ekg that he shows me today.  Has had CAD, PAF on amiodarone, and sees Dr. [name]. He is not currently out of his meds. He is on warfarin.   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 S/P Ant MI 1980;  b. 1997 S/P CABG x 8 (LIMA to diag-LAD, SVG to OM1-OM2-OM3, SVG to [facility] - Dr [name]);  c. 01/2015 Cath: 3VD, 8/8 patent grafts.   Cardiomyopathy    Dysrhythmia    Esophageal reflux    GERD (gastroesophageal reflux disease)    Headache    History of colonoscopy    History of transesophageal echocardiography (TEE) for monitoring    Other and unspecified hyperlipidemia    S/P TAVR (transcatheter aortic valve replacement)    a. 03/2015 26 mm Edwards Sapien 3 transcatheter heart valve placed via open left transfemoral approach.   Skin lesions, generalized    facial which may represent actinic keratoses and possible photosensitivity from Amiodarone   Unspecified essential hypertension     Patient Active Problem List   Diagnosis Date Noted   Chronic combined systolic and diastolic CHF (congestive heart failure) ([place]) 04/16/2018   Long term (current) use of anticoagulants 08/25/2017   Actinic keratoses 03/21/2017   Internal hemorrhoid, bleeding 07/01/2015   Rectal bleeding 06/15/2015   Internal hemorrhoids 06/15/2015   Other constipation 06/15/2015    Chronic anticoagulation-Couamdin 03/12/2015   S/P TAVR (transcatheter aortic valve replacement) 03/10/2015   Anemia 09/03/2014   Encounter for therapeutic drug monitoring 12/31/2013   Yellow jacket sting 08/06/2013   Low back pain on right side with sciatica 04/24/2013   Severe aortic stenosis 08/10/2012   CAD (coronary artery disease) 08/10/2012   Left ventricular dysfunction 08/10/2012   Routine health maintenance 07/12/2012   BEE STING REACTION, LOCAL 09/21/2010   BEN LOC HYPERPLASIA PROS W/UR OBST & OTH LUTS 08/02/2010   Acute and chronic cholecystitis 02/01/2010   Abdominal pain, generalized 01/29/2010   CORONARY ATHEROSCLEROSIS NATIVE CORONARY ARTERY 10/13/2009   HYPERTHYROIDISM 05/22/2009   INSOMNIA 05/22/2009   Hyperlipidemia 04/22/2009   Cardiomyopathy, ischemic-EF 35% 04/22/2009   CAD, AUTOLOGOUS BYPASS GRAFT 10/16/2008   Essential hypertension 09/07/2007   Aortic valve disorder 09/07/2007   Chronic atrial fibrillation ([place]) 09/07/2007   GASTROESOPHAGEAL REFLUX DISEASE 09/07/2007   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    Past Surgical History:  Procedure Laterality Date   CARDIAC CATHETERIZATION     CARDIOVERSION  11/18/2006   Dr. [name]   CATARACT EXTRACTION W/ INTRAOCULAR LENS IMPLANT  April '13  (Dr. [name])   left eye only   CHOLECYSTECTOMY     CORONARY ARTERY BYPASS GRAFT  02/09/1996   LIMA to diag-LAD, SVG to OM1-OM2-OM3, SVG to [facility]   EYE SURGERY  LEFT AND RIGHT HEART CATHETERIZATION WITH CORONARY/GRAFT ANGIOGRAM N/A 01/26/2015   Procedure: LEFT AND RIGHT HEART CATHETERIZATION WITH [name];  Surgeon: [name], MD;  Location: [facility] CATH LAB;  Service: Cardiovascular;  Laterality: N/A;   TEE WITHOUT CARDIOVERSION N/A 03/10/2015   Procedure: TRANSESOPHAGEAL ECHOCARDIOGRAM (TEE);  Surgeon: [name], MD;  Location: [place];  Service: Open Heart Surgery;  Laterality: N/A;   TONSILLECTOMY     TRANSCATHETER AORTIC VALVE REPLACEMENT,  TRANSFEMORAL N/A 03/10/2015   Procedure: TRANSCATHETER AORTIC VALVE REPLACEMENT, TRANSFEMORAL;  Surgeon: [name], MD;  Location: [place];  Service: Open Heart Surgery;  Laterality: N/A;       Home Medications    Prior to Admission medications   Medication Sig Start Date End Date Taking? Authorizing Provider  warfarin (COUMADIN) 4 MG tablet TAKE 1 TABLET BY MOUTH DAILY OR TAKE AS DIRECTED BY ANTICOAGULATION CLINIC 01/10/22   [name], [name], MD  acetaminophen (TYLENOL) 500 MG tablet Take 500 mg by mouth every 6 (six) hours as needed for moderate pain.    [provider]  albuterol (VENTOLIN HFA) 108 (90 Base) MCG/ACT inhaler INHALE 2 PUFFS BY MOUTH EVERY 4 HOURS ASNEEDED FOR WHEEZING OR SHORTNESS OF BREATH. 01/27/21   [name], [name], MD  amiodarone (PACERONE) 200 MG tablet TAKE 1/2 TABLET BY MOUTH DAILY 03/09/21   [name], MD  amLODipine (NORVASC) 5 MG tablet TAKE 1 TABLET BY MOUTH ONCE DAILY 03/09/21   [name], MD  amoxicillin (AMOXIL) 400 MG/5ML suspension TAKE 5 TEASPOONFULS BY MOUTH 1 HOUR PRIOR TO DENTAL PROCEDURE 04/19/21   [name], [name], MD  Ascorbic Acid (VITAMIN C) 1000 MG tablet Take 1,000 mg by mouth daily.    [provider]  aspirin 81 MG tablet Take 81 mg by mouth daily.    [provider]  atorvastatin (LIPITOR) 80 MG tablet TAKE 1/2 TABLET BY MOUTH DAILY 06/01/21   [name], [name], MD  loratadine (CLARITIN) 10 MG tablet Take 10 mg by mouth daily as needed for allergies.    [provider]  losartan (COZAAR) 100 MG tablet TAKE 1 TABLET BY MOUTH EVERY DAY 12/07/21   [name], [name], MD  Multiple Vitamins-Minerals (MULTIVITAMIN,TX-MINERALS) tablet Take 1 tablet by mouth daily.    [provider]    Family History Family History  Problem Relation Age of Onset   Stroke Mother 50       Deceased   Crohn's disease Mother        Deceased   Heart attack Father 46       Deceased   Heart disease Father     Heart failure Father        Deceased   Atrial fibrillation Brother    Heart disease Paternal Uncle    Prostate cancer Paternal Uncle    Colon cancer Neg Hx     Social History Social History   Tobacco Use   Smoking status: Never   Smokeless tobacco: Never   Tobacco comments:    Does not smoke.  Vaping Use   Vaping Use: Never used  Substance Use Topics   Alcohol use: No    Alcohol/week: 0.0 standard drinks   Drug use: No     Allergies   Codeine and Iodine   Review of Systems Review of Systems   Physical Exam Triage Vital Signs ED Triage Vitals [01/10/22 0824]  Enc Vitals Group     BP (!) 173/52     Pulse Rate 73  Resp 15     Temp      Temp src      SpO2 100 %     Weight      Height      Head Circumference      Peak Flow      Pain Score 0     Pain Loc      Pain Edu?      Excl. in [place]?    No data found.  Updated Vital Signs BP (!) 173/52 (BP Location: Left Arm)    Pulse 73    Resp 15    SpO2 100%   Visual Acuity Right Eye Distance:   Left Eye Distance:   Bilateral Distance:    Right Eye Near:   Left Eye Near:    Bilateral Near:     Physical Exam Vitals reviewed.  Constitutional:      General: He is not in acute distress.    Appearance: He is not ill-appearing, toxic-appearing or diaphoretic.  HENT:     Mouth/Throat:     Mouth: Mucous membranes are moist.  Eyes:     Extraocular Movements: Extraocular movements intact.     Conjunctiva/sclera: Conjunctivae normal.     Pupils: Pupils are equal, round, and reactive to light.  Cardiovascular:     Comments: Rate is 70s, irregular rhythm Pulmonary:     Effort: Pulmonary effort is normal.     Breath sounds: Normal breath sounds. No wheezing, rhonchi or rales.  Chest:     Chest wall: No tenderness.  Musculoskeletal:     Cervical back: Neck supple.  Lymphadenopathy:     Cervical: No cervical adenopathy.     UC Treatments / Results  Labs (all labs ordered are listed, but only abnormal  results are displayed) Labs Reviewed - No data to display  EKG   Radiology No results found.  Procedures Procedures (including critical care time)  Medications Ordered in UC Medications - No data to display  Initial Impression / Assessment and Plan / UC Course  I have reviewed the triage vital signs and the nursing notes.  Pertinent labs & imaging results that were available during my care of the patient were reviewed by me and considered in my medical decision making (see chart for details).     Ekg shows overall sinus rhythm, with one beat from another focus., HR 60s. Tel enc note routed to his cardiology office. Final Clinical Impressions(s) / UC Diagnoses   Final diagnoses:  Palpitations  Paroxysmal atrial fibrillation [facility])     Discharge Instructions      Your EKG did not show any dangerous arrhythmia today.  I have routed a note to your cardiology doctor and his nurse.  Please also call Dr. Antionette Char office today to see what further instructions they have for you     ED Prescriptions   None    PDMP not reviewed this encounter.   Barrett Henle, MD 01/10/22 [phone]

## 2022-01-10 NOTE — Telephone Encounter (Signed)
Patient came with EKG. Urgent care note in epic. See other phone note for additional information

## 2022-01-10 NOTE — ED Triage Notes (Signed)
Pt presents with c/o irregular heart beats.   Pt states he is on medication for it and states his heart Doctor has not sent in his medicine.

## 2022-01-10 NOTE — Discharge Instructions (Signed)
Your EKG did not show any dangerous arrhythmia today.  I have routed a note to your cardiology doctor and his nurse.  Please also call Dr. Antionette Char office today to see what further instructions they have for you

## 2022-01-10 NOTE — Telephone Encounter (Signed)
Pt compliant with warfarin management and PCP apts. Sent in refill. 

## 2022-01-10 NOTE — Telephone Encounter (Signed)
Pt here today at [facility] at [facility] for feeling more palpitations than usual. No cp/dyspnea/diaphoresis. No increase in edema. No f/c/URI symptoms.  EKG from another [facility] shows sinus rhythm with PVC's. Ours today shows one beat, the first one, from another focus, but no other premature beats. Ventricular rate is 63.   Routing you this note so y'all can look at his EKG from today, and let him know what else to do.  Thank you, Ascencion Dike, MD

## 2022-01-11 ENCOUNTER — Ambulatory Visit (INDEPENDENT_AMBULATORY_CARE_PROVIDER_SITE_OTHER): Payer: Medicare Other

## 2022-01-11 ENCOUNTER — Encounter: Payer: Self-pay | Admitting: *Deleted

## 2022-01-11 ENCOUNTER — Telehealth: Payer: Self-pay | Admitting: *Deleted

## 2022-01-11 DIAGNOSIS — I480 Paroxysmal atrial fibrillation: Secondary | ICD-10-CM

## 2022-01-11 DIAGNOSIS — R002 Palpitations: Secondary | ICD-10-CM

## 2022-01-11 NOTE — Progress Notes (Unsigned)
Enrolled for [employer] to mail a ZIO XT long term holter monitor to the patients address on file.  Letter with instructions mailed to patient. 

## 2022-01-11 NOTE — Telephone Encounter (Signed)
Attempted to call patient with Dr Antionette Char recommendations, no answer. Left message to return call to office.

## 2022-01-11 NOTE — Telephone Encounter (Signed)
[name], MD to Donnalee Curry K   5:50 AM Note If patient still experiencing palpitations, please order 3 day ZIO monitor. Reviewed EKG and it shows NSR with PVC's. Thank you      Called patient to inform him of Dr. Antionette Char response. Patient stated he is still having some palpitations. Will place order for monitor. Patient verbalized understanding.

## 2022-01-11 NOTE — Telephone Encounter (Signed)
No answer, No DPR

## 2022-01-11 NOTE — Telephone Encounter (Signed)
If patient still experiencing palpitations, please order 3 day ZIO monitor. Reviewed EKG and it shows NSR with PVC's. Thank you

## 2022-01-11 NOTE — Telephone Encounter (Signed)
Patient states he is returning call back.

## 2022-01-11 NOTE — Telephone Encounter (Signed)
Called patient to inform him of Dr. Antionette Char response. Patient stated he is still having some palpitations. Will place order for monitor. Patient verbalized understanding.

## 2022-01-11 NOTE — Addendum Note (Signed)
Addended by: Aris Georgia, Darrian Goodwill L on: 01/11/2022 02:27 PM   Modules accepted: Orders

## 2022-01-13 DIAGNOSIS — R002 Palpitations: Secondary | ICD-10-CM | POA: Diagnosis not present

## 2022-01-13 DIAGNOSIS — I480 Paroxysmal atrial fibrillation: Secondary | ICD-10-CM

## 2022-01-25 DIAGNOSIS — R002 Palpitations: Secondary | ICD-10-CM | POA: Diagnosis not present

## 2022-01-25 DIAGNOSIS — I480 Paroxysmal atrial fibrillation: Secondary | ICD-10-CM | POA: Diagnosis not present

## 2022-02-03 ENCOUNTER — Ambulatory Visit: Payer: Medicare Other

## 2022-02-04 ENCOUNTER — Ambulatory Visit (INDEPENDENT_AMBULATORY_CARE_PROVIDER_SITE_OTHER): Payer: Medicare Other

## 2022-02-04 ENCOUNTER — Other Ambulatory Visit: Payer: Self-pay

## 2022-02-04 DIAGNOSIS — Z7901 Long term (current) use of anticoagulants: Secondary | ICD-10-CM

## 2022-02-04 LAB — POCT INR: INR: 2.2 (ref 2.0–3.0)

## 2022-02-04 NOTE — Patient Instructions (Addendum)
Pre visit review using our clinic review tool, if applicable. No additional management support is needed unless otherwise documented below in the visit note. ? ?Continue to take 1 (4 mg) tablet daily.  Re-check in 5 weeks. ?

## 2022-02-04 NOTE — Progress Notes (Signed)
Continue to take 1 (4 mg) tablet daily.  Re-check in 5 weeks. ?

## 2022-02-15 ENCOUNTER — Inpatient Hospital Stay (HOSPITAL_COMMUNITY)
Admission: EM | Admit: 2022-02-15 | Discharge: 2022-02-20 | DRG: 246 | Disposition: A | Discharge status: Home / Self Care | Payer: Medicare Other | Attending: Cardiology | Admitting: Cardiology

## 2022-02-15 ENCOUNTER — Other Ambulatory Visit: Payer: Self-pay

## 2022-02-15 ENCOUNTER — Emergency Department (HOSPITAL_COMMUNITY): Payer: Medicare Other

## 2022-02-15 DIAGNOSIS — Z885 Allergy status to narcotic agent status: Secondary | ICD-10-CM

## 2022-02-15 DIAGNOSIS — I480 Paroxysmal atrial fibrillation: Secondary | ICD-10-CM | POA: Diagnosis present

## 2022-02-15 DIAGNOSIS — E785 Hyperlipidemia, unspecified: Secondary | ICD-10-CM | POA: Diagnosis not present

## 2022-02-15 DIAGNOSIS — Z20822 Contact with and (suspected) exposure to covid-19: Secondary | ICD-10-CM | POA: Diagnosis not present

## 2022-02-15 DIAGNOSIS — I110 Hypertensive heart disease with heart failure: Secondary | ICD-10-CM | POA: Diagnosis present

## 2022-02-15 DIAGNOSIS — I214 Non-ST elevation (NSTEMI) myocardial infarction: Secondary | ICD-10-CM | POA: Diagnosis present

## 2022-02-15 DIAGNOSIS — K219 Gastro-esophageal reflux disease without esophagitis: Secondary | ICD-10-CM | POA: Diagnosis present

## 2022-02-15 DIAGNOSIS — Z955 Presence of coronary angioplasty implant and graft: Secondary | ICD-10-CM

## 2022-02-15 DIAGNOSIS — I25810 Atherosclerosis of coronary artery bypass graft(s) without angina pectoris: Secondary | ICD-10-CM | POA: Diagnosis not present

## 2022-02-15 DIAGNOSIS — I4729 Other ventricular tachycardia: Secondary | ICD-10-CM | POA: Diagnosis not present

## 2022-02-15 DIAGNOSIS — I255 Ischemic cardiomyopathy: Secondary | ICD-10-CM | POA: Diagnosis not present

## 2022-02-15 DIAGNOSIS — R079 Chest pain, unspecified: Secondary | ICD-10-CM | POA: Diagnosis not present

## 2022-02-15 DIAGNOSIS — Z7901 Long term (current) use of anticoagulants: Secondary | ICD-10-CM | POA: Diagnosis not present

## 2022-02-15 DIAGNOSIS — I472 Ventricular tachycardia, unspecified: Secondary | ICD-10-CM | POA: Diagnosis not present

## 2022-02-15 DIAGNOSIS — I25110 Atherosclerotic heart disease of native coronary artery with unstable angina pectoris: Secondary | ICD-10-CM | POA: Diagnosis present

## 2022-02-15 DIAGNOSIS — Z953 Presence of xenogenic heart valve: Secondary | ICD-10-CM | POA: Diagnosis not present

## 2022-02-15 DIAGNOSIS — I200 Unstable angina: Secondary | ICD-10-CM | POA: Diagnosis not present

## 2022-02-15 DIAGNOSIS — Z95820 Peripheral vascular angioplasty status with implants and grafts: Secondary | ICD-10-CM

## 2022-02-15 DIAGNOSIS — Z9049 Acquired absence of other specified parts of digestive tract: Secondary | ICD-10-CM | POA: Diagnosis not present

## 2022-02-15 DIAGNOSIS — Z7982 Long term (current) use of aspirin: Secondary | ICD-10-CM | POA: Diagnosis not present

## 2022-02-15 DIAGNOSIS — N179 Acute kidney failure, unspecified: Secondary | ICD-10-CM | POA: Diagnosis not present

## 2022-02-15 DIAGNOSIS — I2510 Atherosclerotic heart disease of native coronary artery without angina pectoris: Secondary | ICD-10-CM | POA: Diagnosis present

## 2022-02-15 DIAGNOSIS — T82857A Stenosis of cardiac prosthetic devices, implants and grafts, initial encounter: Secondary | ICD-10-CM | POA: Diagnosis not present

## 2022-02-15 DIAGNOSIS — Z79899 Other long term (current) drug therapy: Secondary | ICD-10-CM | POA: Diagnosis not present

## 2022-02-15 DIAGNOSIS — I5022 Chronic systolic (congestive) heart failure: Secondary | ICD-10-CM | POA: Diagnosis not present

## 2022-02-15 DIAGNOSIS — I252 Old myocardial infarction: Secondary | ICD-10-CM

## 2022-02-15 DIAGNOSIS — Z952 Presence of prosthetic heart valve: Secondary | ICD-10-CM

## 2022-02-15 DIAGNOSIS — Z8249 Family history of ischemic heart disease and other diseases of the circulatory system: Secondary | ICD-10-CM

## 2022-02-15 DIAGNOSIS — R0789 Other chest pain: Secondary | ICD-10-CM | POA: Diagnosis not present

## 2022-02-15 DIAGNOSIS — I482 Chronic atrial fibrillation, unspecified: Secondary | ICD-10-CM | POA: Diagnosis present

## 2022-02-15 DIAGNOSIS — Z888 Allergy status to other drugs, medicaments and biological substances status: Secondary | ICD-10-CM | POA: Diagnosis not present

## 2022-02-15 DIAGNOSIS — Z961 Presence of intraocular lens: Secondary | ICD-10-CM | POA: Diagnosis not present

## 2022-02-15 DIAGNOSIS — I359 Nonrheumatic aortic valve disorder, unspecified: Secondary | ICD-10-CM | POA: Diagnosis present

## 2022-02-15 DIAGNOSIS — I4819 Other persistent atrial fibrillation: Secondary | ICD-10-CM | POA: Diagnosis present

## 2022-02-15 DIAGNOSIS — R9431 Abnormal electrocardiogram [ECG] [EKG]: Secondary | ICD-10-CM | POA: Diagnosis not present

## 2022-02-15 DIAGNOSIS — Y832 Surgical operation with anastomosis, bypass or graft as the cause of abnormal reaction of the patient, or of later complication, without mention of misadventure at the time of the procedure: Secondary | ICD-10-CM | POA: Diagnosis present

## 2022-02-15 LAB — TROPONIN I (HIGH SENSITIVITY)
Troponin I (High Sensitivity): 700 ng/L (ref <18)
Troponin I (High Sensitivity): 1301 ng/L (ref <18)
Troponin I (High Sensitivity): 1757 ng/L (ref <18)

## 2022-02-15 LAB — CBC
HCT: 37.6 % — ABNORMAL LOW (ref 39.0–52.0)
Hemoglobin: 12.5 g/dL — ABNORMAL LOW (ref 13.0–17.0)
MCH: 33.4 pg (ref 26.0–34.0)
MCHC: 33.2 g/dL (ref 30.0–36.0)
MCV: 100.5 fL — ABNORMAL HIGH (ref 80.0–100.0)
Platelets: 145 10*3/uL — ABNORMAL LOW (ref 150–400)
RBC: 3.74 MIL/uL — ABNORMAL LOW (ref 4.22–5.81)
RDW: 13.3 % (ref 11.5–15.5)
WBC: 7.9 10*3/uL (ref 4.0–10.5)
nRBC: 0 % (ref 0.0–0.2)

## 2022-02-15 LAB — BASIC METABOLIC PANEL
Anion gap: 7 (ref 5–15)
BUN: 15 mg/dL (ref 8–23)
CO2: 25 mmol/L (ref 22–32)
Calcium: 9.2 mg/dL (ref 8.9–10.3)
Chloride: 106 mmol/L (ref 98–111)
Creatinine, Ser: 0.99 mg/dL (ref 0.61–1.24)
GFR, Estimated: >60 mL/min (ref >60)
Glucose, Bld: 99 mg/dL (ref 70–99)
Potassium: 4.4 mmol/L (ref 3.5–5.1)
Sodium: 138 mmol/L (ref 135–145)

## 2022-02-15 LAB — PROTIME-INR
INR: 2 — ABNORMAL HIGH (ref 0.8–1.2)
Prothrombin Time: 23.1 seconds — ABNORMAL HIGH (ref 11.4–15.2)

## 2022-02-15 LAB — HEPARIN LEVEL (UNFRACTIONATED): Heparin Unfractionated: 0.19 IU/mL — ABNORMAL LOW (ref 0.30–0.70)

## 2022-02-15 LAB — RESP PANEL BY RT-PCR (FLU A&B, COVID) ARPGX2
Influenza A by PCR: NEGATIVE
Influenza B by PCR: NEGATIVE
SARS Coronavirus 2 by RT PCR: NEGATIVE

## 2022-02-15 MED ORDER — ONDANSETRON HCL 4 MG/2ML IJ SOLN: 4.0000 mg | Freq: Four times a day (QID) | INTRAMUSCULAR | Status: DC | PRN

## 2022-02-15 MED ORDER — ASPIRIN 81 MG PO CHEW
324.0000 mg | CHEWABLE_TABLET | Freq: Once | ORAL | Status: AC
  Administered 2022-02-15: 324 mg via ORAL
  Filled 2022-02-15: qty 4

## 2022-02-15 MED ORDER — HEPARIN BOLUS VIA INFUSION
3600.0000 [IU] | Freq: Once | INTRAVENOUS | Status: AC
  Administered 2022-02-15: 3600 [IU] via INTRAVENOUS
  Filled 2022-02-15: qty 3600

## 2022-02-15 MED ORDER — ACETAMINOPHEN 325 MG PO TABS
650.0000 mg | ORAL_TABLET | ORAL | Status: DC | PRN
  Administered 2022-02-20: 650 mg via ORAL
  Filled 2022-02-15: qty 2

## 2022-02-15 MED ORDER — AMLODIPINE BESYLATE 5 MG PO TABS
5.0000 mg | ORAL_TABLET | Freq: Every day | ORAL | Status: DC
  Administered 2022-02-16: 5 mg via ORAL
  Filled 2022-02-15: qty 1

## 2022-02-15 MED ORDER — ASPIRIN EC 81 MG PO TBEC
81.0000 mg | DELAYED_RELEASE_TABLET | Freq: Every day | ORAL | Status: DC
Start: 2022-02-16 — End: 2022-02-19
  Administered 2022-02-16 – 2022-02-18 (×3): 81 mg via ORAL
  Filled 2022-02-15 (×3): qty 1

## 2022-02-15 MED ORDER — NITROGLYCERIN 0.4 MG SL SUBL: 0.4000 mg | SUBLINGUAL_TABLET | SUBLINGUAL | Status: DC | PRN

## 2022-02-15 MED ORDER — ASCORBIC ACID 500 MG PO TABS
1000.0000 mg | ORAL_TABLET | Freq: Every day | ORAL | Status: DC
  Administered 2022-02-16 – 2022-02-20 (×5): 1000 mg via ORAL
  Filled 2022-02-15 (×5): qty 2

## 2022-02-15 MED ORDER — ATORVASTATIN CALCIUM 40 MG PO TABS
40.0000 mg | ORAL_TABLET | Freq: Every day | ORAL | Status: DC
Start: 2022-02-15 — End: 2022-02-16
  Administered 2022-02-15 – 2022-02-16 (×2): 40 mg via ORAL
  Filled 2022-02-15 (×2): qty 1

## 2022-02-15 MED ORDER — ADULT MULTIVITAMIN W/MINERALS CH
1.0000 | ORAL_TABLET | Freq: Every day | ORAL | Status: DC
  Administered 2022-02-15 – 2022-02-20 (×6): 1 via ORAL
  Filled 2022-02-15 (×6): qty 1

## 2022-02-15 MED ORDER — AMIODARONE HCL 200 MG PO TABS
100.0000 mg | ORAL_TABLET | Freq: Every day | ORAL | Status: DC
  Administered 2022-02-16 – 2022-02-18 (×3): 100 mg via ORAL
  Filled 2022-02-15 (×3): qty 1

## 2022-02-15 MED ORDER — LORATADINE 10 MG PO TABS
10.0000 mg | ORAL_TABLET | Freq: Every day | ORAL | Status: DC | PRN
Start: 2022-02-15 — End: 2022-02-20
  Administered 2022-02-16: 10 mg via ORAL
  Filled 2022-02-15: qty 1

## 2022-02-15 MED ORDER — HEPARIN (PORCINE) 25000 UT/250ML-% IV SOLN
950.0000 [IU]/h | INTRAVENOUS | Status: DC
  Administered 2022-02-15: 700 [IU]/h via INTRAVENOUS
  Administered 2022-02-16: 950 [IU]/h via INTRAVENOUS
  Filled 2022-02-15 (×2): qty 250

## 2022-02-15 NOTE — H&P (Addendum)
?Cardiology Admission History and Physical:  ? ?Patient ID: Jesus [name] ?MRN: [id number]; DOB: 07/16/1941  ? ?Admission date: 02/15/2022 ? ?PCP:  [name], MD ?  ?[place] [facility] Providers ?Cardiologist:  [name], MD   { ? ?Chief Complaint:  weakness/fatigue/chest pain  ? ?Patient Profile:  ? ?Jesus [name] is a 81 y.o. male with history of CAD s/p CABG in 1997, severe aortic stenosis s/p TAVR with perivalvular leak 03/2015, paroxysmal atrial fibrillation on warfarin and amiodarone, hyperlipidemia, hypertension who is being seen 02/15/2022 for the evaluation of Chest pain and fatigue. ? ?Cardiac catheterization February 2016 prior to TAVR showed severe native three-vessel disease.  Patent LIMA to LAD, SVG to OM branch and SVG to RCA branch. Severe stenosis of the distal OM 3 branch after the insertion of the saphenous vein graft. ? ?Last echocardiogram June/2022 showed LV function of 45 to 50%, no regional wall motion abnormality, grade 1 diastolic dysfunction.  Normal functioning valve with perivalvular leak.  Mean gradient 15 mmHg. ? ?Recently noted elevated heart rate.  Wore a monitor without significant abnormality. ? ?History of Present Illness:  ? ?[name] was in usual state of health up until yesterday morning while noted fatigue while walking on treadmill.  Usually he walks on treadmill for about 20 minutes every other day.  Yesterday he got unusually tired after walking for 10 minutes.  Then he went to work and work it for 4.5 hours.  He was very tired and fatigue.  Went to sleep and [name] have felt like indigestion.  This morning he reported a personal chest pressure with minimal activity.  He went to work again but symptoms exacerbated and came to ER for further evaluation.  Currently chest pain-free but reports fatigue.  High-sensitivity troponin trending up 700>>1301.  INR 2.0.  Hemoglobin 12.5.  Potassium 4.4.  Pending respiratory panel.  Chest x-ray without acute abnormality.  He  is a poor historian.  Denies orthopnea, PND, syncope, lower extremity edema or melena.  Reports compliance with his medication. ? ? ?Past Medical History:  ?Diagnosis Date  ? 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 Aortic stenosis   ? mild with a mean aortic valve gradient of 12 mmHg  ? Atrial fibrillation ([place])   ? holding sinus rhythm on Amiodarone  ? CAD (coronary artery disease)   ? a. S/P Ant MI 1980;  b. 1997 S/P CABG x 8 (LIMA to diag-LAD, SVG to OM1-OM2-OM3, SVG to [facility] - Dr [name]);  c. 01/2015 Cath: 3VD, 8/8 patent grafts.  ? Cardiomyopathy   ? Dysrhythmia   ? Esophageal reflux   ? GERD (gastroesophageal reflux disease)   ? Headache   ? History of colonoscopy   ? History of transesophageal echocardiography (TEE) for monitoring   ? Other and unspecified hyperlipidemia   ? S/P TAVR (transcatheter aortic valve replacement)   ? a. 03/2015 26 mm Edwards Sapien 3 transcatheter heart valve placed via open left transfemoral approach.  ? Skin lesions, generalized   ? facial which [name] represent actinic keratoses and possible photosensitivity from Amiodarone  ? Unspecified essential hypertension   ? ? ?Past Surgical History:  ?Procedure Laterality Date  ? CARDIAC CATHETERIZATION    ? CARDIOVERSION  11/18/2006  ? Dr. [name]  ? CATARACT EXTRACTION W/ INTRAOCULAR LENS IMPLANT  April '13  (Dr. [name])  ? left eye only  ? CHOLECYSTECTOMY    ? CORONARY ARTERY BYPASS GRAFT  02/09/1996  ? LIMA to diag-LAD,  SVG to OM1-OM2-OM3, SVG to [facility]  ? EYE SURGERY    ? LEFT AND RIGHT HEART CATHETERIZATION WITH CORONARY/GRAFT ANGIOGRAM N/A 01/26/2015  ? Procedure: LEFT AND RIGHT HEART CATHETERIZATION WITH [name];  Surgeon: [name], MD;  Location: [facility] CATH LAB;  Service: Cardiovascular;  Laterality: N/A;  ? TEE WITHOUT CARDIOVERSION N/A 03/10/2015  ? Procedure: TRANSESOPHAGEAL ECHOCARDIOGRAM (TEE);  Surgeon: [name], MD;  Location: [place];  Service: Open Heart Surgery;   Laterality: N/A;  ? TONSILLECTOMY    ? TRANSCATHETER AORTIC VALVE REPLACEMENT, TRANSFEMORAL N/A 03/10/2015  ? Procedure: TRANSCATHETER AORTIC VALVE REPLACEMENT, TRANSFEMORAL;  Surgeon: [name], MD;  Location: [place];  Service: Open Heart Surgery;  Laterality: N/A;  ?  ? ?Medications Prior to Admission: ?Prior to Admission medications   ?Medication Sig Start Date End Date Taking? Authorizing Provider  ?warfarin (COUMADIN) 4 MG tablet TAKE 1 TABLET BY MOUTH DAILY OR TAKE AS DIRECTED BY ANTICOAGULATION CLINIC 01/10/22  Yes [name], [name], MD  ?acetaminophen (TYLENOL) 500 MG tablet Take 500 mg by mouth every 6 (six) hours as needed for moderate pain.    [provider]  ?albuterol (VENTOLIN HFA) 108 (90 Base) MCG/ACT inhaler INHALE 2 PUFFS BY MOUTH EVERY 4 HOURS ASNEEDED FOR WHEEZING OR SHORTNESS OF BREATH. 01/27/21   [name], [name], MD  ?amiodarone (PACERONE) 200 MG tablet TAKE 1/2 TABLET BY MOUTH DAILY 03/09/21   [name], MD  ?amLODipine (NORVASC) 5 MG tablet TAKE 1 TABLET BY MOUTH ONCE DAILY 03/09/21   [name], MD  ?amoxicillin (AMOXIL) 400 MG/5ML suspension TAKE 5 TEASPOONFULS BY MOUTH 1 HOUR PRIOR TO DENTAL PROCEDURE 04/19/21   [name], [name], MD  ?Ascorbic Acid (VITAMIN C) 1000 MG tablet Take 1,000 mg by mouth daily.    [provider]  ?aspirin 81 MG tablet Take 81 mg by mouth daily.    [provider]  ?atorvastatin (LIPITOR) 80 MG tablet TAKE 1/2 TABLET BY MOUTH DAILY 06/01/21   [name], [name], MD  ?loratadine (CLARITIN) 10 MG tablet Take 10 mg by mouth daily as needed for allergies.    [provider]  ?losartan (COZAAR) 100 MG tablet TAKE 1 TABLET BY MOUTH EVERY DAY 12/07/21   [name], [name], MD  ?Multiple Vitamins-Minerals (MULTIVITAMIN,TX-MINERALS) tablet Take 1 tablet by mouth daily.    [provider]  ?  ? ?Allergies:    ?Allergies  ?Allergen Reactions  ? Codeine Other (See Comments)  ?  Pt does not remember  ? Iodine Swelling   ? ? ?Social History:   ?Social History  ? ?Socioeconomic History  ? Marital status: Married  ?  Spouse name: Not on file  ? Number of children: Not on file  ? Years of education: Not on file  ? Highest education level: Not on file  ?Occupational History  ? Not on file  ?Tobacco Use  ? Smoking status: Never  ? Smokeless tobacco: Never  ? Tobacco comments:  ?  Does not smoke.  ?Vaping Use  ? Vaping Use: Never used  ?Substance and Sexual Activity  ? Alcohol use: No  ?  Alcohol/week: 0.0 standard drinks  ? Drug use: No  ? Sexual activity: Not Currently  ?Other Topics Concern  ? Not on file  ?Social History Narrative  ? [employer]. [employer] 6 years. Married - '69 - 1 year/divorced. '76 - . No children. Work - mfg/textiles - Designer, multimedia; currently works doing maintenance. [employer] - they have discussed  this. Provided packet august '13.   ?   ?   ?   ?   ? ?Social Determinants of Health  ? ?Financial Resource Strain: Low Risk   ? Difficulty of Paying Living Expenses: Not hard at all  ?Food Insecurity: No Food Insecurity  ? Worried About Charity fundraiser in the Last Year: Never true  ? Ran Out of Food in the Last Year: Never true  ?Transportation Needs: No Transportation Needs  ? Lack of Transportation (Medical): No  ? Lack of Transportation (Non-Medical): No  ?Physical Activity: Sufficiently Active  ? Days of Exercise per Week: 5 days  ? Minutes of Exercise per Session: 60 min  ?Stress: No Stress Concern Present  ? Feeling of Stress : Not at all  ?Social Connections: Moderately Integrated  ? Frequency of Communication with Friends and Family: Three times a week  ? Frequency of Social Gatherings with Friends and Family: Three times a week  ? Attends Religious Services: More than 4 times per year  ? Active Member of Clubs or Organizations: Yes  ? Attends Archivist Meetings: More than 4 times per year  ? Marital Status: Widowed  ?Intimate Partner Violence: Not At Risk  ? Fear of Current or Ex-Partner: No   ? Emotionally Abused: No  ? Physically Abused: No  ? Sexually Abused: No  ?  ?Family History:  ?The patient's family history includes Atrial fibrillation in his brother; Crohn's disease in his mother; Hear

## 2022-02-15 NOTE — ED Triage Notes (Signed)
Pt. Stated, I had some chest pain this morning while I was sweeping. Went to UC and they sent me down here. (ER).  ?Pt. Denies any other symptoms. ?

## 2022-02-15 NOTE — ED Provider Notes (Signed)
?[facility] ?Provider Note ? ? ?CSN: [id number] ?Arrival date & time: 02/15/22  1035 ? ?  ? ?History ? ?Chief Complaint  ?Patient presents with  ? Chest Pain  ? ? ?Jesus [name] is a 81 y.o. male. ? ?HPI ?Patient presents with his sister-in-law who assists with the history somewhat. ?He presents after an episode of chest pain that occurred just prior to ED arrival.  He notes that he was sleeping this morning, developed chest pain.  This lasted about half an hour, improved with rest.  He went to urgent care and was sent here for evaluation.  Currently denies pain, dyspnea.  Sister-in-law does not add that he has been recently ill or other new findings.  EMS reports the patient was hemodynamically unremarkable in route. ?  ? ?Home Medications ?Prior to Admission medications   ?Medication Sig Start Date End Date Taking? Authorizing Provider  ?warfarin (COUMADIN) 4 MG tablet TAKE 1 TABLET BY MOUTH DAILY OR TAKE AS DIRECTED BY ANTICOAGULATION CLINIC 01/10/22   [name], [name], MD  ?acetaminophen (TYLENOL) 500 MG tablet Take 500 mg by mouth every 6 (six) hours as needed for moderate pain.    [provider]  ?albuterol (VENTOLIN HFA) 108 (90 Base) MCG/ACT inhaler INHALE 2 PUFFS BY MOUTH EVERY 4 HOURS ASNEEDED FOR WHEEZING OR SHORTNESS OF BREATH. 01/27/21   [name], [name], MD  ?amiodarone (PACERONE) 200 MG tablet TAKE 1/2 TABLET BY MOUTH DAILY 03/09/21   [name], MD  ?amLODipine (NORVASC) 5 MG tablet TAKE 1 TABLET BY MOUTH ONCE DAILY 03/09/21   [name], MD  ?amoxicillin (AMOXIL) 400 MG/5ML suspension TAKE 5 TEASPOONFULS BY MOUTH 1 HOUR PRIOR TO DENTAL PROCEDURE 04/19/21   [name], [name], MD  ?Ascorbic Acid (VITAMIN C) 1000 MG tablet Take 1,000 mg by mouth daily.    [provider]  ?aspirin 81 MG tablet Take 81 mg by mouth daily.    [provider]  ?atorvastatin (LIPITOR) 80 MG tablet TAKE 1/2 TABLET BY MOUTH DAILY 06/01/21   [name],  [name], MD  ?loratadine (CLARITIN) 10 MG tablet Take 10 mg by mouth daily as needed for allergies.    [provider]  ?losartan (COZAAR) 100 MG tablet TAKE 1 TABLET BY MOUTH EVERY DAY 12/07/21   [name], [name], MD  ?Multiple Vitamins-Minerals (MULTIVITAMIN,TX-MINERALS) tablet Take 1 tablet by mouth daily.    [provider]  ?   ? ?Allergies    ?Codeine and Iodine   ? ?Review of Systems   ?Review of Systems  ?Constitutional:   ?     Per HPI, otherwise negative  ?HENT:    ?     Per HPI, otherwise negative  ?Respiratory:    ?     Per HPI, otherwise negative  ?Cardiovascular:   ?     Per HPI, otherwise negative  ?Gastrointestinal:  Negative for vomiting.  ?Endocrine:  ?     Negative aside from HPI  ?Genitourinary:   ?     Neg aside from HPI   ?Musculoskeletal:   ?     Per HPI, otherwise negative  ?Skin: Negative.   ?Neurological:  Negative for syncope.  ? ?Physical Exam ?Updated Vital Signs ?BP (!) 153/58   Pulse 64   Temp 98.3 ?F (36.8 ?C) (Oral)   Resp 19   SpO2 100%  ?Physical Exam ?Vitals and nursing note reviewed.  ?Constitutional:   ?   General: He is not in  acute distress. ?   Appearance: He is well-developed.  ?HENT:  ?   Head: Normocephalic and atraumatic.  ?Eyes:  ?   Conjunctiva/sclera: Conjunctivae normal.  ?Cardiovascular:  ?   Rate and Rhythm: Normal rate and regular rhythm.  ?Pulmonary:  ?   Effort: Pulmonary effort is normal. No respiratory distress.  ?   Breath sounds: No stridor.  ?Abdominal:  ?   General: There is no distension.  ?Skin: ?   General: Skin is warm and dry.  ?Neurological:  ?   Mental Status: He is alert and oriented to person, place, and time.  ? ? ?ED Results / Procedures / Treatments   ?Labs ?(all labs ordered are listed, but only abnormal results are displayed) ?Labs Reviewed  ?CBC - Abnormal; Notable for the following components:  ?    Result Value  ? RBC 3.74 (*)   ? Hemoglobin 12.5 (*)   ? HCT 37.6 (*)   ? MCV 100.5 (*)   ? Platelets 145 (*)   ? All  other components within normal limits  ?PROTIME-INR - Abnormal; Notable for the following components:  ? Prothrombin Time 23.1 (*)   ? INR 2.0 (*)   ? All other components within normal limits  ?TROPONIN I (HIGH SENSITIVITY) - Abnormal; Notable for the following components:  ? Troponin I (High Sensitivity) 700 (*)   ? All other components within normal limits  ?RESP PANEL BY RT-PCR (FLU A&B, COVID) ARPGX2  ?BASIC METABOLIC PANEL  ?TROPONIN I (HIGH SENSITIVITY)  ? ? ?EKG ?EKG Interpretation ? ?Date/Time:  Tuesday February 15 2022 10:33:40 EDT ?Ventricular Rate:  68 ?PR Interval:  166 ?QRS Duration: 114 ?QT Interval:  402 ?QTC Calculation: 427 ?R Axis:   44 ?Text Interpretation: Normal sinus rhythm Cannot rule out Anterior infarct , age undetermined T wave abnormality Abnormal ECG Confirmed by Carmin Muskrat [phone]) on 02/15/2022 12:01:33 PM ? ?Radiology ?DG Chest 2 View ? ?Result Date: 02/15/2022 ?CLINICAL DATA:  Chest pain EXAM: CHEST - 2 VIEW COMPARISON:  11/15/2021 FINDINGS: Transverse diameter of heart is increased. There is prosthetic aortic valve. There is suggestion of previous coronary bypass surgery. There are no signs of pulmonary edema or focal pulmonary consolidation. There is no pleural effusion or pneumothorax. Patient's chin is partly obscuring the apices. IMPRESSION: There are no signs of pulmonary edema or new focal infiltrates. Electronically Signed   By: Elmer Picker M.D.   On: 02/15/2022 11:15   ? ?Procedures ?Procedures  ? ? ?Medications Ordered in ED ?Medications - No data to display ? ?ED Course/ Medical Decision Making/ A&P ?This patient presents to the ED for concern of adult male presenting with chest pain in the context of exertion, no prior history of angina, this involves an extensive number of treatment options, and is a complaint that carries with it a high risk of complications and morbidity.  The differential diagnosis includes unstable angina, respiratory etiology considered as  well.  Given the resolution following rest, though the former is more of a concern ? ? ?Co morbidities that complicate the patient evaluation ? ?Age, CABG, prior valve repair ? ?Social Determinants of Health: ? ?Age ? ?Additional history obtained: ? ?Additional history and/or information obtained from sister-in-law, chart review ?External records from outside source obtained and reviewed including chart review details as below ?Echocardiogram June, 2022: ?1. Left ventricular ejection fraction, by estimation, is 45 to 50%. Left  ?ventricular ejection fraction by PLAX is 50 %. The left ventricle has  ?mildly  decreased function. The left ventricle has no regional wall motion  ?abnormalities. Left ventricular  ?diastolic parameters are consistent with Grade I diastolic dysfunction  ?(impaired relaxation). There is severe akinesis of the left ventricular,  ?entire inferior wall and inferolateral wall.  ? 2. Right ventricular systolic function is normal. The right ventricular  ?size is normal. There is normal pulmonary artery systolic pressure.  ? 3. The mitral valve is abnormal. Trivial mitral valve regurgitation.  ? 4. The aortic valve has been repaired/replaced. Aortic valve  ?regurgitation is trivial. There is a 26 mm Sapien prosthetic (TAVR) valve  ?present in the aortic position. Procedure Date: 03/2015. Echo findings are  ?consistent with perivalvular leak of the  ?aortic prosthesis. Aortic valve area, by VTI measures 0.89 cm?[name] Aortic  ?valve mean gradient measures 15.0 mmHg. Aortic valve Vmax measures 2.69  ?m/s.  ? 5. The inferior vena cava is normal in size with greater than 50%  ?respiratory variability, suggesting right atrial pressure of 3 mmHg.  ? ? ? ?After the initial evaluation, orders, including: Labs, x-ray were initiated. ? ? ?Patient placed on Cardiac and Pulse-Oximetry Monitors. ?The patient was maintained on a cardiac monitor.  The cardiac monitored showed an rhythm of 70 sinus normal ?The patient  was also maintained on pulse oximetry. The readings were typically 99% room air normal ? ? ?On repeat evaluation of the patient stayed the same ? ?Lab Tests: ? ?I personally interpreted labs.  The pertinent resu

## 2022-02-15 NOTE — H&P (Signed)
S/P Ant MI 1980;  b. 1997 S/P CABG x 8 (LIMA to diag-LAD, SVG to OM1-OM2-OM3, SVG to [facility] - Dr [name]);  c. 01/2015 Cath: 3VD, 8/8 patent grafts. ?Non-STEMI.  Status post TAVR.  Femoral approach will be required. ?

## 2022-02-15 NOTE — Progress Notes (Signed)
ANTICOAGULATION CONSULT NOTE - Initial Consult ? ?Pharmacy Consult for Heparin infusion ?Indication: chest pain/ACS ? ?Allergies  ?Allergen Reactions  ? Codeine Other (See Comments)  ?  Pt does not remember  ? Iodine Swelling  ? ? ?Patient Measurements: ?Height: '5\' 1"'$  (154.9 cm) ?Weight: 59.9 kg (132 lb) ?IBW/kg (Calculated) : 52.3 ?Heparin Dosing Weight: 59.9 kg ? ?Vital Signs: ?Temp: 98.3 ?F (36.8 ?C) (03/14 1049) ?Temp Source: Oral (03/14 1049) ?BP: 152/55 (03/14 1300) ?Pulse Rate: 57 (03/14 1300) ? ?Labs: ?Recent Labs  ?  02/15/22 ?1056 02/15/22 ?1217  ?HGB 12.5*  --   ?HCT 37.6*  --   ?PLT 145*  --   ?LABPROT  --  23.1*  ?INR  --  2.0*  ?CREATININE 0.99  --   ?TROPONINIHS 700* 1,301*  ? ? ?Estimated Creatinine Clearance: 41.8 mL/min (by C-G formula based on SCr of 0.99 mg/dL). ? ? ?Medical History: ?Past Medical History:  ?Diagnosis Date  ? 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 Aortic stenosis   ? mild with a mean aortic valve gradient of 12 mmHg  ? Atrial fibrillation ([place])   ? holding sinus rhythm on Amiodarone  ? CAD (coronary artery disease)   ? a. S/P Ant MI 1980;  b. 1997 S/P CABG x 8 (LIMA to diag-LAD, SVG to OM1-OM2-OM3, SVG to [facility] - Dr [name]);  c. 01/2015 Cath: 3VD, 8/8 patent grafts.  ? Cardiomyopathy   ? Dysrhythmia   ? Esophageal reflux   ? GERD (gastroesophageal reflux disease)   ? Headache   ? History of colonoscopy   ? History of transesophageal echocardiography (TEE) for monitoring   ? Other and unspecified hyperlipidemia   ? S/P TAVR (transcatheter aortic valve replacement)   ? a. 03/2015 26 mm Edwards Sapien 3 transcatheter heart valve placed via open left transfemoral approach.  ? Skin lesions, generalized   ? facial which may represent actinic keratoses and possible photosensitivity from Amiodarone  ? Unspecified essential hypertension   ? ? ?Assessment: ?81 yo M presents with chest pain and elevated cardiac troponins. Pharmacy has been consulted to initiate and  manage heparin infusion for ACS. Pt takes warfarin '4mg'$  po daily for atrial fibrillation at home. Last dose of warfarin was 02/15/22 ~07:00.  ? ?INR on admission is 2.0.  ?Hgb/Hct/Plt stable.   ? ?Goal of Therapy:  ?Heparin level 0.3-0.7 units/ml ?Monitor platelets by anticoagulation protocol: Yes ?  ?Plan:  ?Per discussion with cardiology PA, [facility], will proceed with bolus, followed by infusion.  ?Hold warfarin and bridge with heparin. ?Give 3600 units bolus x 1 ?Start heparin infusion at 700 units/hr ?Check anti-Xa level in 8 hours and daily while on heparin ?Continue to monitor H&H and platelets ? ?Kaleen Mask ?02/15/2022,1:36 PM ? ? ?

## 2022-02-15 NOTE — Progress Notes (Signed)
ANTICOAGULATION CONSULT NOTE - Initial Consult ? ?Pharmacy Consult for Heparin infusion ?Indication: chest pain/ACS ? ?Allergies  ?Allergen Reactions  ? Codeine Other (See Comments)  ?  Pt does not remember  ? Iodine Swelling  ? ? ?Patient Measurements: ?Height: '5\' 1"'$  (154.9 cm) ?Weight: 58.8 kg (129 lb 9.6 oz) ?IBW/kg (Calculated) : 52.3 ?Heparin Dosing Weight: 59.9 kg ? ?Vital Signs: ?Temp: 97.8 ?F (36.6 ?C) (03/14 2020) ?Temp Source: Oral (03/14 2020) ?BP: 135/56 (03/14 2020) ?Pulse Rate: 67 (03/14 2020) ? ?Labs: ?Recent Labs  ?  02/15/22 ?1056 02/15/22 ?1217 02/15/22 ?2021  ?HGB 12.5*  --   --   ?HCT 37.6*  --   --   ?PLT 145*  --   --   ?LABPROT  --  23.1*  --   ?INR  --  2.0*  --   ?HEPARINUNFRC  --   --  0.19*  ?CREATININE 0.99  --   --   ?TROPONINIHS 700* 1,301* 1,757*  ? ? ? ?Estimated Creatinine Clearance: 41.8 mL/min (by C-G formula based on SCr of 0.99 mg/dL). ? ? ?Medical History: ?Past Medical History:  ?Diagnosis Date  ? 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 Aortic stenosis   ? mild with a mean aortic valve gradient of 12 mmHg  ? Atrial fibrillation ([place])   ? holding sinus rhythm on Amiodarone  ? CAD (coronary artery disease)   ? a. S/P Ant MI 1980;  b. 1997 S/P CABG x 8 (LIMA to diag-LAD, SVG to OM1-OM2-OM3, SVG to [facility] - Dr [name]);  c. 01/2015 Cath: 3VD, 8/8 patent grafts.  ? Cardiomyopathy   ? Dysrhythmia   ? Esophageal reflux   ? GERD (gastroesophageal reflux disease)   ? Headache   ? History of colonoscopy   ? History of transesophageal echocardiography (TEE) for monitoring   ? Other and unspecified hyperlipidemia   ? S/P TAVR (transcatheter aortic valve replacement)   ? a. 03/2015 26 mm Edwards Sapien 3 transcatheter heart valve placed via open left transfemoral approach.  ? Skin lesions, generalized   ? facial which may represent actinic keratoses and possible photosensitivity from Amiodarone  ? Unspecified essential hypertension   ? ? ?Assessment: ?81 yo M presents  with chest pain and elevated cardiac troponins. Pharmacy has been consulted to initiate and manage heparin infusion for ACS. Pt takes warfarin '4mg'$  po daily for atrial fibrillation at home. Last dose of warfarin was 02/15/22 ~07:00.  ? ?INR on admission is 2.0.  ?Heparin level 0.19 (on 700 units/hr) ? ?Goal of Therapy:  ?Heparin level 0.3-0.7 units/ml ?Monitor platelets by anticoagulation protocol: Yes ?  ?Plan:  ?Increase heparin drip to 800 units/hr  ?Heparin level with am labs ?Plan for [facility] 3/15. F/u anticoagulation plans post procedure ?Daily heparin level and CBC ordered ?Monitor for signs/symptoms of bleed ? ?Thank you for allowing pharmacy to be a part of this patient?s care. ? ?Donnald Garre, PharmD ?Clinical Pharmacist ? ?Please check AMION for all [facility] numbers ?After 10:00 PM, call [place] [phone] ? ? ? ? ?

## 2022-02-16 ENCOUNTER — Inpatient Hospital Stay (HOSPITAL_COMMUNITY): Payer: Medicare Other

## 2022-02-16 ENCOUNTER — Telehealth: Payer: Medicare Other

## 2022-02-16 DIAGNOSIS — I214 Non-ST elevation (NSTEMI) myocardial infarction: Secondary | ICD-10-CM

## 2022-02-16 LAB — BASIC METABOLIC PANEL
Anion gap: 9 (ref 5–15)
BUN: 18 mg/dL (ref 8–23)
CO2: 25 mmol/L (ref 22–32)
Calcium: 8.8 mg/dL — ABNORMAL LOW (ref 8.9–10.3)
Chloride: 104 mmol/L (ref 98–111)
Creatinine, Ser: 1.1 mg/dL (ref 0.61–1.24)
GFR, Estimated: >60 mL/min (ref >60)
Glucose, Bld: 99 mg/dL (ref 70–99)
Potassium: 4 mmol/L (ref 3.5–5.1)
Sodium: 138 mmol/L (ref 135–145)

## 2022-02-16 LAB — LIPID PANEL
Cholesterol: 155 mg/dL (ref 0–200)
HDL: 49 mg/dL (ref >40)
LDL Cholesterol: 92 mg/dL (ref 0–99)
Total CHOL/HDL Ratio: 3.2 RATIO
Triglycerides: 68 mg/dL (ref <150)
VLDL: 14 mg/dL (ref 0–40)

## 2022-02-16 LAB — CBC
HCT: 36.3 % — ABNORMAL LOW (ref 39.0–52.0)
Hemoglobin: 11.9 g/dL — ABNORMAL LOW (ref 13.0–17.0)
MCH: 32.6 pg (ref 26.0–34.0)
MCHC: 32.8 g/dL (ref 30.0–36.0)
MCV: 99.5 fL (ref 80.0–100.0)
Platelets: 141 10*3/uL — ABNORMAL LOW (ref 150–400)
RBC: 3.65 MIL/uL — ABNORMAL LOW (ref 4.22–5.81)
RDW: 13.2 % (ref 11.5–15.5)
WBC: 7.4 10*3/uL (ref 4.0–10.5)
nRBC: 0 % (ref 0.0–0.2)

## 2022-02-16 LAB — ECHOCARDIOGRAM COMPLETE
AR max vel: 1.26 cm2
AV Area VTI: 1.46 cm2
AV Area mean vel: 1.53 cm2
AV Mean grad: 16.3 mmHg
AV Peak grad: 34.3 mmHg
Ao pk vel: 2.93 m/s
Area-P 1/2: 3.6 cm2
Calc EF: 45.9 %
Height: 61 in
S' Lateral: 4.6 cm
Single Plane A2C EF: 50.3 %
Single Plane A4C EF: 41.4 %
Weight: 2048 oz

## 2022-02-16 LAB — HEPARIN LEVEL (UNFRACTIONATED)
Heparin Unfractionated: 0.22 IU/mL — ABNORMAL LOW (ref 0.30–0.70)
Heparin Unfractionated: 0.41 IU/mL (ref 0.30–0.70)
Heparin Unfractionated: 0.4 IU/mL (ref 0.30–0.70)

## 2022-02-16 LAB — PROTIME-INR
INR: 1.9 — ABNORMAL HIGH (ref 0.8–1.2)
INR: 1.9 — ABNORMAL HIGH (ref 0.8–1.2)
Prothrombin Time: 22.1 seconds — ABNORMAL HIGH (ref 11.4–15.2)
Prothrombin Time: 21.3 seconds — ABNORMAL HIGH (ref 11.4–15.2)

## 2022-02-16 MED ORDER — ATORVASTATIN CALCIUM 80 MG PO TABS
80.0000 mg | ORAL_TABLET | Freq: Every day | ORAL | Status: DC
  Administered 2022-02-17 – 2022-02-20 (×4): 80 mg via ORAL
  Filled 2022-02-16 (×4): qty 1

## 2022-02-16 MED ORDER — AMLODIPINE BESYLATE 10 MG PO TABS
10.0000 mg | ORAL_TABLET | Freq: Every day | ORAL | Status: DC
  Administered 2022-02-17 – 2022-02-20 (×4): 10 mg via ORAL
  Filled 2022-02-16 (×4): qty 1

## 2022-02-16 NOTE — Progress Notes (Signed)
?  Transition of Care (TOC) Screening Note ? ? ?Patient Details  ?Name: Jesus [name] ?Date of Birth: 11-04-1941 ? ? ?Transition of Care (TOC) CM/SW Contact:    ?Milas Gain, LCSWA ?Phone Number: ?02/16/2022, 11:56 AM ? ? ? ?Transition of Care Department [facility]) has reviewed patient and no TOC needs have been identified at this time. We will continue to monitor patient advancement through interdisciplinary progression rounds. If new patient transition needs arise, please place a TOC consult. ?  ?

## 2022-02-16 NOTE — Progress Notes (Signed)
ANTICOAGULATION CONSULT NOTE  ? ?Pharmacy Consult for Heparin infusion ?Indication: chest pain/ACS ? ?Allergies  ?Allergen Reactions  ? Codeine Other (See Comments)  ?  Pt does not remember  ? Iodine Swelling  ? ? ?Patient Measurements: ?Height: '5\' 1"'$  (154.9 cm) ?Weight: 58.1 kg (128 lb) ?IBW/kg (Calculated) : 52.3 ?Heparin Dosing Weight: 59.9 kg ? ?Vital Signs: ?Temp: 98.3 ?F (36.8 ?C) (03/15 1346) ?Temp Source: Oral (03/15 1346) ?BP: 132/48 (03/15 1346) ?Pulse Rate: 56 (03/15 1346) ? ?Labs: ?Recent Labs  ?  02/15/22 ?1056 02/15/22 ?1217 02/15/22 ?2021 02/16/22 ?0446 02/16/22 ?1320  ?HGB 12.5*  --   --  11.9*  --   ?HCT 37.6*  --   --  36.3*  --   ?PLT 145*  --   --  141*  --   ?LABPROT  --  23.1*  --  22.1* 21.3*  ?INR  --  2.0*  --  1.9* 1.9*  ?HEPARINUNFRC  --   --  0.19* 0.22* 0.41  ?CREATININE 0.99  --   --  1.10  --   ?TROPONINIHS 700* 1,301* 1,757*  --   --   ? ? ? ?Estimated Creatinine Clearance: 37.6 mL/min (by C-G formula based on SCr of 1.1 mg/dL). ? ? ?Medical History: ?Past Medical History:  ?Diagnosis Date  ? 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 Aortic stenosis   ? mild with a mean aortic valve gradient of 12 mmHg  ? Atrial fibrillation ([place])   ? holding sinus rhythm on Amiodarone  ? CAD (coronary artery disease)   ? a. S/P Ant MI 1980;  b. 1997 S/P CABG x 8 (LIMA to diag-LAD, SVG to OM1-OM2-OM3, SVG to [facility] - Dr [name]);  c. 01/2015 Cath: 3VD, 8/8 patent grafts.  ? Cardiomyopathy   ? Dysrhythmia   ? Esophageal reflux   ? GERD (gastroesophageal reflux disease)   ? Headache   ? History of colonoscopy   ? History of transesophageal echocardiography (TEE) for monitoring   ? Other and unspecified hyperlipidemia   ? S/P TAVR (transcatheter aortic valve replacement)   ? a. 03/2015 26 mm Edwards Sapien 3 transcatheter heart valve placed via open left transfemoral approach.  ? Skin lesions, generalized   ? facial which may represent actinic keratoses and possible photosensitivity  from Amiodarone  ? Unspecified essential hypertension   ? ? ?Assessment: ?81 yo M presents with chest pain and elevated cardiac troponins. Pharmacy has been consulted to initiate and manage heparin infusion for ACS. Pt takes warfarin '4mg'$  po daily for atrial fibrillation at home. Last dose of warfarin was 02/15/22 ~07:00.  ? ?Heparin level this afternoon within goal range at 0.41, no overt bleeding or complications noted.  INR remains 1.9 at this afternoon check ? ?Goal of Therapy:  ?Heparin level 0.3-0.7 units/ml ?Monitor platelets by anticoagulation protocol: Yes ?  ?Plan:  ?Continue IV heparin at 950 units/hr. ?Recheck heparin level in 8 hrs. ?Daily heparin level and CBC. ? ?[place] Crane, Pharm D, BCPS, BCCP ?Clinical Pharmacist ? 02/16/2022 2:24 PM  ? ?[facility] pharmacy phone numbers are listed on [email or web address] ? ? ? ? ? ?

## 2022-02-16 NOTE — Progress Notes (Signed)
ANTICOAGULATION CONSULT NOTE  ?Pharmacy Consult for Heparin ?Indication: chest pain/ACS ?Brief A/P: Heparin level subtherapeutic Increase Heparin rate ? ?Allergies  ?Allergen Reactions  ? Codeine Other (See Comments)  ?  Pt does not remember  ? Iodine Swelling  ? ? ?Patient Measurements: ?Height: '5\' 1"'$  (154.9 cm) ?Weight: 58.1 kg (128 lb) ?IBW/kg (Calculated) : 52.3 ?Heparin Dosing Weight: 59.9 kg ? ?Vital Signs: ?Temp: 97.9 ?F (36.6 ?C) (03/15 0500) ?Temp Source: Oral (03/15 0500) ?BP: 122/59 (03/15 0500) ?Pulse Rate: 64 (03/15 0500) ? ?Labs: ?Recent Labs  ?  02/15/22 ?1056 02/15/22 ?1217 02/15/22 ?2021 02/16/22 ?0446  ?HGB 12.5*  --   --  11.9*  ?HCT 37.6*  --   --  36.3*  ?PLT 145*  --   --  141*  ?LABPROT  --  23.1*  --  22.1*  ?INR  --  2.0*  --  1.9*  ?HEPARINUNFRC  --   --  0.19* 0.22*  ?CREATININE 0.99  --   --   --   ?TROPONINIHS 700* 1,301* 1,757*  --   ? ? ? ?Estimated Creatinine Clearance: 41.8 mL/min (by C-G formula based on SCr of 0.99 mg/dL). ? ?Assessment: ?81 y.o. male admitted with chest pain, Coumadin on hold, for heparin ? ?Goal of Therapy:  ?Heparin level 0.3-0.7 units/ml ?Monitor platelets by anticoagulation protocol: Yes ?  ?Plan:  ?Increase Heparin 950 units/hr ?Check heparin level in 8 hours.  ? ?Phillis Knack, PharmD, BCPS ? ? ? ? ? ?

## 2022-02-16 NOTE — Progress Notes (Signed)
?  Echocardiogram ?2D Echocardiogram has been performed. ? ?[name] ?02/16/2022, 9:54 AM ?

## 2022-02-16 NOTE — Progress Notes (Signed)
ANTICOAGULATION CONSULT NOTE - Follow Up Consult ? ?Pharmacy Consult for heparin ?Indication:  NSTEMI/Afib ? ?Labs: ?Recent Labs  ?  02/15/22 ?1056 02/15/22 ?1217 02/15/22 ?2021 02/15/22 ?2021 02/16/22 ?0446 02/16/22 ?1320 02/16/22 ?2155  ?HGB 12.5*  --   --   --  11.9*  --   --   ?HCT 37.6*  --   --   --  36.3*  --   --   ?PLT 145*  --   --   --  141*  --   --   ?LABPROT  --  23.1*  --   --  22.1* 21.3*  --   ?INR  --  2.0*  --   --  1.9* 1.9*  --   ?HEPARINUNFRC  --   --  0.19*   < > 0.22* 0.41 0.40  ?CREATININE 0.99  --   --   --  1.10  --   --   ?TROPONINIHS 700* 1,301* 1,757*  --   --   --   --   ? < > = values in this interval not displayed.  ? ? ?Assessment/Plan:  ?81yo male remains therapeutic on heparin for NSTEMI and Afib. Will continue infusion at current rate of 950 units/hr and monitor daily level.  ? ?Wynona Neat, PharmD, BCPS  ?02/16/2022,11:44 PM ? ? ?

## 2022-02-16 NOTE — H&P (View-Only) (Signed)
? ?Progress Note ? ?Patient Name: Jesus [name] ?Date of Encounter: 02/16/2022 ? ?[facility] Cardiologist: [name], MD  ? ?Subjective  ? ?No chest pain. Echo at the bedside.  ? ?Inpatient Medications  ?  ?Scheduled Meds: ? amiodarone  100 mg Oral Daily  ? amLODipine  5 mg Oral Daily  ? vitamin C  1,000 mg Oral Daily  ? aspirin EC  81 mg Oral Daily  ? atorvastatin  40 mg Oral Daily  ? multivitamin with minerals  1 tablet Oral Daily  ? ?Continuous Infusions: ? heparin 950 Units/hr (02/16/22 0626)  ? ?PRN Meds: ?acetaminophen, loratadine, nitroGLYCERIN, ondansetron (ZOFRAN) IV  ? ?Vital Signs  ?  ?Vitals:  ? 02/15/22 2020 02/15/22 2300 02/16/22 0500 02/16/22 0824  ?BP: (!) 135/56 (!) 122/43 (!) 122/59 (!) 148/67  ?Pulse: 67 65 64 65  ?Resp: '20 16 17 17  '$ ?Temp: 97.8 ?F (36.6 ?C) 98.6 ?F (37 ?C) 97.9 ?F (36.6 ?C) 98.3 ?F (36.8 ?C)  ?TempSrc: Oral Oral Oral Oral  ?SpO2: 99% 96% 95% 98%  ?Weight:   58.1 kg   ?Height:      ? ? ?Intake/Output Summary (Last 24 hours) at 02/16/2022 0917 ?Last data filed at 02/16/2022 6644 ?Gross per 24 hour  ?Intake 296.8 ml  ?Output --  ?Net 296.8 ml  ? ?Last 3 Weights 02/16/2022 02/15/2022 02/15/2022  ?Weight (lbs) 128 lb 129 lb 9.6 oz 132 lb  ?Weight (kg) 58.06 kg 58.786 kg 59.875 kg  ?   ? ?Telemetry  ?  ?SB - Personally Reviewed ? ?ECG  ?  ?No new tracing this morning ? ?Physical Exam  ? ?GEN: No acute distress.   ?Neck: No JVD ?Cardiac: RRR, + 3/6 systolic murmur, no rubs, or gallops.  ?Respiratory: Clear to auscultation bilaterally. ?GI: Soft, nontender, non-distended  ?MS: No edema; No deformity. ?Neuro:  Nonfocal  ?Psych: Normal affect  ? ?Labs  ?  ?High Sensitivity Troponin:   ?Recent Labs  ?Lab 02/15/22 ?1056 02/15/22 ?1217 02/15/22 ?2021  ?TROPONINIHS 700* 1,301* 1,757*  ?   ?Chemistry ?Recent Labs  ?Lab 02/15/22 ?1056 02/16/22 ?0446  ?NA 138 138  ?K 4.4 4.0  ?CL 106 104  ?CO2 25 25  ?GLUCOSE 99 99  ?BUN 15 18  ?CREATININE 0.99 1.10  ?CALCIUM 9.2 8.8*  ?GFRNONAA >60 >60   ?ANIONGAP 7 9  ?  ?Lipids  ?Recent Labs  ?Lab 02/16/22 ?0446  ?CHOL 155  ?TRIG 68  ?HDL 49  ?[place] 92  ?CHOLHDL 3.2  ?  ?Hematology ?Recent Labs  ?Lab 02/15/22 ?1056 02/16/22 ?0446  ?WBC 7.9 7.4  ?RBC 3.74* 3.65*  ?HGB 12.5* 11.9*  ?HCT 37.6* 36.3*  ?MCV 100.5* 99.5  ?MCH 33.4 32.6  ?MCHC 33.2 32.8  ?RDW 13.3 13.2  ?PLT 145* 141*  ? ?Thyroid No results for input(s): TSH, FREET4 in the last 168 hours.  ?BNPNo results for input(s): BNP, PROBNP in the last 168 hours.  ?DDimer No results for input(s): DDIMER in the last 168 hours.  ? ?Radiology  ?  ?DG Chest 2 View ? ?Result Date: 02/15/2022 ?CLINICAL DATA:  Chest pain EXAM: CHEST - 2 VIEW COMPARISON:  11/15/2021 FINDINGS: Transverse diameter of heart is increased. There is prosthetic aortic valve. There is suggestion of previous coronary bypass surgery. There are no signs of pulmonary edema or focal pulmonary consolidation. There is no pleural effusion or pneumothorax. Patient's chin is partly obscuring the apices. IMPRESSION: There are no signs of pulmonary edema or new focal  infiltrates. Electronically Signed   By: Elmer Picker M.D.   On: 02/15/2022 11:15   ? ?Cardiac Studies  ? ?Echo: pending ? ?Patient Profile  ?   ?81 y.o. male with history of CAD s/p CABG in 1997, severe aortic stenosis s/p TAVR with perivalvular leak 03/2015, paroxysmal atrial fibrillation on warfarin and amiodarone, hyperlipidemia, hypertension who was seen 02/15/2022 for the evaluation of Chest pain and fatigue. ? ?Assessment & Plan  ?  ?Non-STEMI with history of CAD s/p CABG: Patient reported fatigue. hsTn 700>>1301>>1757. EKG with T wave changes in inferior leads which is similar to prior EKG in 2020. Planned for cath once INR around 1.7. Will recheck later this afternoon ?-- remains on IV heparin, ASA, statin ?  ?S/p TAVR: Last echocardiogram June/2022 showed LV function of 45 to 50%, no regional wall motion abnormality, grade 1 diastolic dysfunction.  Normal functioning valve with  perivalvular leak.  Mean gradient 15 mmHg. ?-- echo this admission pending ?  ?PAF: Maintaining sinus rhythm ?-- Continue amiodarone 100 mg daily ?-- Coumadin held, on IV heparin  ?-- Baseline heart rate in 50s to 60s (not on AV nodal blocking agent) ?  ?Hypertension: mostly controlled ?-- increase amlodipine to '10mg'$  daily ?-- losartan on hold with plans for cath ?  ?HLD: LDL 92 ?-- will increase atorvastatin to '80mg'$  daily  ? ?For questions or updates, please contact [place] ?Please consult [email or web address] for contact info under  ? ?  ?   ?Signed, ?Reino Bellis, NP  ?02/16/2022, 9:17 AM   ? ?Patient seen, examined. Available data reviewed. Agree with findings, assessment, and plan as outlined by Reino Bellis, NP.  The patient is independently interviewed and examined.  He is a pleasant elderly male in no distress.  Lungs are clear, HEENT is normal, JVP is normal, heart is regular rate and rhythm with a 2/6 diastolic decrescendo murmur at the right upper sternal border, abdomen is soft and nontender, extremities have no edema.  The patient presents with non-STEMI with extensive history of coronary artery disease and prior CABG.  I agree that cardiac catheterization and possible PCI are indicated.  I discussed this with the patient and his family members who are at the bedside today.  I reviewed risks, indications, and alternatives to cardiac catheterization and PCI with him.  We are awaiting his repeat INR to come back and if it is in appropriate range of 1.7 or less, we will proceed with cardiac catheterization today.  The patient's last heart catheterization is reviewed.  This was performed in 2016 and demonstrated patency of all of his vein grafts and his LIMA graft.  I would guess that he has a new vein graft lesion at this point. ? ?[name], M.D. ?02/16/2022 ?1:19 PM ? ? ?

## 2022-02-16 NOTE — Progress Notes (Addendum)
? ?Progress Note ? ?Patient Name: Jesus [name] ?Date of Encounter: 02/16/2022 ? ?[facility] Cardiologist: [name], MD  ? ?Subjective  ? ?No chest pain. Echo at the bedside.  ? ?Inpatient Medications  ?  ?Scheduled Meds: ? amiodarone  100 mg Oral Daily  ? amLODipine  5 mg Oral Daily  ? vitamin C  1,000 mg Oral Daily  ? aspirin EC  81 mg Oral Daily  ? atorvastatin  40 mg Oral Daily  ? multivitamin with minerals  1 tablet Oral Daily  ? ?Continuous Infusions: ? heparin 950 Units/hr (02/16/22 0626)  ? ?PRN Meds: ?acetaminophen, loratadine, nitroGLYCERIN, ondansetron (ZOFRAN) IV  ? ?Vital Signs  ?  ?Vitals:  ? 02/15/22 2020 02/15/22 2300 02/16/22 0500 02/16/22 0824  ?BP: (!) 135/56 (!) 122/43 (!) 122/59 (!) 148/67  ?Pulse: 67 65 64 65  ?Resp: '20 16 17 17  '$ ?Temp: 97.8 ?F (36.6 ?C) 98.6 ?F (37 ?C) 97.9 ?F (36.6 ?C) 98.3 ?F (36.8 ?C)  ?TempSrc: Oral Oral Oral Oral  ?SpO2: 99% 96% 95% 98%  ?Weight:   58.1 kg   ?Height:      ? ? ?Intake/Output Summary (Last 24 hours) at 02/16/2022 0917 ?Last data filed at 02/16/2022 5809 ?Gross per 24 hour  ?Intake 296.8 ml  ?Output --  ?Net 296.8 ml  ? ?Last 3 Weights 02/16/2022 02/15/2022 02/15/2022  ?Weight (lbs) 128 lb 129 lb 9.6 oz 132 lb  ?Weight (kg) 58.06 kg 58.786 kg 59.875 kg  ?   ? ?Telemetry  ?  ?SB - Personally Reviewed ? ?ECG  ?  ?No new tracing this morning ? ?Physical Exam  ? ?GEN: No acute distress.   ?Neck: No JVD ?Cardiac: RRR, + 3/6 systolic murmur, no rubs, or gallops.  ?Respiratory: Clear to auscultation bilaterally. ?GI: Soft, nontender, non-distended  ?MS: No edema; No deformity. ?Neuro:  Nonfocal  ?Psych: Normal affect  ? ?Labs  ?  ?High Sensitivity Troponin:   ?Recent Labs  ?Lab 02/15/22 ?1056 02/15/22 ?1217 02/15/22 ?2021  ?TROPONINIHS 700* 1,301* 1,757*  ?   ?Chemistry ?Recent Labs  ?Lab 02/15/22 ?1056 02/16/22 ?0446  ?NA 138 138  ?K 4.4 4.0  ?CL 106 104  ?CO2 25 25  ?GLUCOSE 99 99  ?BUN 15 18  ?CREATININE 0.99 1.10  ?CALCIUM 9.2 8.8*  ?GFRNONAA >60 >60   ?ANIONGAP 7 9  ?  ?Lipids  ?Recent Labs  ?Lab 02/16/22 ?0446  ?CHOL 155  ?TRIG 68  ?HDL 49  ?[place] 92  ?CHOLHDL 3.2  ?  ?Hematology ?Recent Labs  ?Lab 02/15/22 ?1056 02/16/22 ?0446  ?WBC 7.9 7.4  ?RBC 3.74* 3.65*  ?HGB 12.5* 11.9*  ?HCT 37.6* 36.3*  ?MCV 100.5* 99.5  ?MCH 33.4 32.6  ?MCHC 33.2 32.8  ?RDW 13.3 13.2  ?PLT 145* 141*  ? ?Thyroid No results for input(s): TSH, FREET4 in the last 168 hours.  ?BNPNo results for input(s): BNP, PROBNP in the last 168 hours.  ?DDimer No results for input(s): DDIMER in the last 168 hours.  ? ?Radiology  ?  ?DG Chest 2 View ? ?Result Date: 02/15/2022 ?CLINICAL DATA:  Chest pain EXAM: CHEST - 2 VIEW COMPARISON:  11/15/2021 FINDINGS: Transverse diameter of heart is increased. There is prosthetic aortic valve. There is suggestion of previous coronary bypass surgery. There are no signs of pulmonary edema or focal pulmonary consolidation. There is no pleural effusion or pneumothorax. Patient's chin is partly obscuring the apices. IMPRESSION: There are no signs of pulmonary edema or new focal  infiltrates. Electronically Signed   By: Elmer Picker M.D.   On: 02/15/2022 11:15   ? ?Cardiac Studies  ? ?Echo: pending ? ?Patient Profile  ?   ?81 y.o. male with history of CAD s/p CABG in 1997, severe aortic stenosis s/p TAVR with perivalvular leak 03/2015, paroxysmal atrial fibrillation on warfarin and amiodarone, hyperlipidemia, hypertension who was seen 02/15/2022 for the evaluation of Chest pain and fatigue. ? ?Assessment & Plan  ?  ?Non-STEMI with history of CAD s/p CABG: Patient reported fatigue. hsTn 700>>1301>>1757. EKG with T wave changes in inferior leads which is similar to prior EKG in 2020. Planned for cath once INR around 1.7. Will recheck later this afternoon ?-- remains on IV heparin, ASA, statin ?  ?S/p TAVR: Last echocardiogram June/2022 showed LV function of 45 to 50%, no regional wall motion abnormality, grade 1 diastolic dysfunction.  Normal functioning valve with  perivalvular leak.  Mean gradient 15 mmHg. ?-- echo this admission pending ?  ?PAF: Maintaining sinus rhythm ?-- Continue amiodarone 100 mg daily ?-- Coumadin held, on IV heparin  ?-- Baseline heart rate in 50s to 60s (not on AV nodal blocking agent) ?  ?Hypertension: mostly controlled ?-- increase amlodipine to '10mg'$  daily ?-- losartan on hold with plans for cath ?  ?HLD: LDL 92 ?-- will increase atorvastatin to '80mg'$  daily  ? ?For questions or updates, please contact [place] ?Please consult [email or web address] for contact info under  ? ?  ?   ?Signed, ?Reino Bellis, NP  ?02/16/2022, 9:17 AM   ? ?Patient seen, examined. Available data reviewed. Agree with findings, assessment, and plan as outlined by Reino Bellis, NP.  The patient is independently interviewed and examined.  He is a pleasant elderly male in no distress.  Lungs are clear, HEENT is normal, JVP is normal, heart is regular rate and rhythm with a 2/6 diastolic decrescendo murmur at the right upper sternal border, abdomen is soft and nontender, extremities have no edema.  The patient presents with non-STEMI with extensive history of coronary artery disease and prior CABG.  I agree that cardiac catheterization and possible PCI are indicated.  I discussed this with the patient and his family members who are at the bedside today.  I reviewed risks, indications, and alternatives to cardiac catheterization and PCI with him.  We are awaiting his repeat INR to come back and if it is in appropriate range of 1.7 or less, we will proceed with cardiac catheterization today.  The patient's last heart catheterization is reviewed.  This was performed in 2016 and demonstrated patency of all of his vein grafts and his LIMA graft.  I would guess that he has a new vein graft lesion at this point. ? ?[name], M.D. ?02/16/2022 ?1:19 PM ? ? ?

## 2022-02-17 ENCOUNTER — Inpatient Hospital Stay (HOSPITAL_COMMUNITY)
Admission: EM | Disposition: A | Discharge status: Home / Self Care | Payer: Self-pay | Source: Home / Self Care | Attending: Cardiology

## 2022-02-17 ENCOUNTER — Encounter (HOSPITAL_COMMUNITY): Payer: Self-pay | Admitting: Cardiovascular Disease

## 2022-02-17 DIAGNOSIS — I25810 Atherosclerosis of coronary artery bypass graft(s) without angina pectoris: Secondary | ICD-10-CM

## 2022-02-17 HISTORY — PX: CORONARY/GRAFT ANGIOGRAPHY: CATH118237

## 2022-02-17 HISTORY — PX: CORONARY STENT INTERVENTION: CATH118234

## 2022-02-17 LAB — CBC
HCT: 35.8 % — ABNORMAL LOW (ref 39.0–52.0)
Hemoglobin: 11.8 g/dL — ABNORMAL LOW (ref 13.0–17.0)
MCH: 32.8 pg (ref 26.0–34.0)
MCHC: 33 g/dL (ref 30.0–36.0)
MCV: 99.4 fL (ref 80.0–100.0)
Platelets: 143 10*3/uL — ABNORMAL LOW (ref 150–400)
RBC: 3.6 MIL/uL — ABNORMAL LOW (ref 4.22–5.81)
RDW: 13.3 % (ref 11.5–15.5)
WBC: 7.8 10*3/uL (ref 4.0–10.5)
nRBC: 0 % (ref 0.0–0.2)

## 2022-02-17 LAB — HEMOGLOBIN A1C
Hgb A1c MFr Bld: 5.9 % — ABNORMAL HIGH (ref 4.8–5.6)
Mean Plasma Glucose: 123 mg/dL

## 2022-02-17 LAB — POCT ACTIVATED CLOTTING TIME
Activated Clotting Time: 353 seconds
Activated Clotting Time: 281 seconds
Activated Clotting Time: 233 seconds

## 2022-02-17 LAB — PROTIME-INR
INR: 1.7 — ABNORMAL HIGH (ref 0.8–1.2)
Prothrombin Time: 19.8 seconds — ABNORMAL HIGH (ref 11.4–15.2)

## 2022-02-17 LAB — HEPARIN LEVEL (UNFRACTIONATED): Heparin Unfractionated: 0.46 IU/mL (ref 0.30–0.70)

## 2022-02-17 SURGERY — CORONARY/GRAFT ANGIOGRAPHY
Anesthesia: LOCAL

## 2022-02-17 MED ORDER — LABETALOL HCL 5 MG/ML IV SOLN: 10.0000 mg | INTRAVENOUS | Status: AC | PRN

## 2022-02-17 MED ORDER — DIPHENHYDRAMINE HCL 50 MG/ML IJ SOLN
25.0000 mg | Freq: Once | INTRAMUSCULAR | Status: AC
  Administered 2022-02-17: 25 mg via INTRAVENOUS
  Filled 2022-02-17: qty 1

## 2022-02-17 MED ORDER — HEPARIN SODIUM (PORCINE) 1000 UNIT/ML IJ SOLN
INTRAMUSCULAR | Status: DC | PRN
  Administered 2022-02-17: 8000 [IU] via INTRAVENOUS

## 2022-02-17 MED ORDER — SODIUM CHLORIDE 0.9 % IV SOLN: 250.0000 mL | INTRAVENOUS | Status: DC | PRN

## 2022-02-17 MED ORDER — HEPARIN (PORCINE) IN NACL 1000-0.9 UT/500ML-% IV SOLN
INTRAVENOUS | Status: AC
  Filled 2022-02-17: qty 1000

## 2022-02-17 MED ORDER — LIDOCAINE HCL (PF) 1 % IJ SOLN
INTRAMUSCULAR | Status: AC
  Filled 2022-02-17: qty 30

## 2022-02-17 MED ORDER — VERAPAMIL HCL 2.5 MG/ML IV SOLN
INTRAVENOUS | Status: AC
  Filled 2022-02-17: qty 2

## 2022-02-17 MED ORDER — SODIUM CHLORIDE 0.9 % IV SOLN: INTRAVENOUS | Status: AC

## 2022-02-17 MED ORDER — HEPARIN SODIUM (PORCINE) 1000 UNIT/ML IJ SOLN
INTRAMUSCULAR | Status: AC
Start: 2022-02-17 — End: ?
  Filled 2022-02-17: qty 10

## 2022-02-17 MED ORDER — HYDRALAZINE HCL 20 MG/ML IJ SOLN: 10.0000 mg | INTRAMUSCULAR | Status: AC | PRN

## 2022-02-17 MED ORDER — CLOPIDOGREL BISULFATE 300 MG PO TABS
ORAL_TABLET | ORAL | Status: DC | PRN
  Administered 2022-02-17: 600 mg via ORAL

## 2022-02-17 MED ORDER — SODIUM CHLORIDE 0.9% FLUSH: 3.0000 mL | INTRAVENOUS | Status: DC | PRN

## 2022-02-17 MED ORDER — IOHEXOL 350 MG/ML SOLN
INTRAVENOUS | Status: DC | PRN
  Administered 2022-02-17: 135 mL

## 2022-02-17 MED ORDER — HEPARIN SODIUM (PORCINE) 1000 UNIT/ML IJ SOLN
INTRAMUSCULAR | Status: AC
  Filled 2022-02-17: qty 10

## 2022-02-17 MED ORDER — ASPIRIN 81 MG PO CHEW: 81.0000 mg | CHEWABLE_TABLET | ORAL | Status: AC

## 2022-02-17 MED ORDER — MIDAZOLAM HCL 2 MG/2ML IJ SOLN
INTRAMUSCULAR | Status: DC | PRN
  Administered 2022-02-17: 1 mg via INTRAVENOUS

## 2022-02-17 MED ORDER — CLOPIDOGREL BISULFATE 300 MG PO TABS
ORAL_TABLET | ORAL | Status: AC
Start: 2022-02-17 — End: ?
  Filled 2022-02-17: qty 2

## 2022-02-17 MED ORDER — MIDAZOLAM HCL 2 MG/2ML IJ SOLN
INTRAMUSCULAR | Status: AC
  Filled 2022-02-17: qty 2

## 2022-02-17 MED ORDER — HEPARIN (PORCINE) IN NACL 1000-0.9 UT/500ML-% IV SOLN
INTRAVENOUS | Status: DC | PRN
  Administered 2022-02-17 (×3): 500 mL

## 2022-02-17 MED ORDER — FENTANYL CITRATE (PF) 100 MCG/2ML IJ SOLN
INTRAMUSCULAR | Status: DC | PRN
  Administered 2022-02-17: 25 ug via INTRAVENOUS

## 2022-02-17 MED ORDER — LIDOCAINE HCL (PF) 1 % IJ SOLN
INTRAMUSCULAR | Status: DC | PRN
  Administered 2022-02-17: 15 mL

## 2022-02-17 MED ORDER — WARFARIN SODIUM 5 MG PO TABS
5.0000 mg | ORAL_TABLET | Freq: Once | ORAL | Status: AC
  Administered 2022-02-17: 5 mg via ORAL
  Filled 2022-02-17: qty 1

## 2022-02-17 MED ORDER — WARFARIN - PHARMACIST DOSING INPATIENT: Freq: Every day | Status: DC

## 2022-02-17 MED ORDER — SODIUM CHLORIDE 0.9% FLUSH: 3.0000 mL | Freq: Two times a day (BID) | INTRAVENOUS | Status: DC

## 2022-02-17 MED ORDER — FENTANYL CITRATE (PF) 100 MCG/2ML IJ SOLN
INTRAMUSCULAR | Status: AC
Start: 2022-02-17 — End: ?
  Filled 2022-02-17: qty 2

## 2022-02-17 MED ORDER — SODIUM CHLORIDE 0.9 % IV SOLN: INTRAVENOUS | Status: DC

## 2022-02-17 MED ORDER — METHYLPREDNISOLONE SODIUM SUCC 125 MG IJ SOLR
125.0000 mg | Freq: Once | INTRAMUSCULAR | Status: AC
  Administered 2022-02-17: 125 mg via INTRAVENOUS
  Filled 2022-02-17: qty 2

## 2022-02-17 MED ORDER — SODIUM CHLORIDE 0.9% FLUSH
3.0000 mL | Freq: Two times a day (BID) | INTRAVENOUS | Status: DC
  Administered 2022-02-17 – 2022-02-20 (×3): 3 mL via INTRAVENOUS

## 2022-02-17 MED ORDER — CLOPIDOGREL BISULFATE 75 MG PO TABS
75.0000 mg | ORAL_TABLET | Freq: Every day | ORAL | Status: DC
  Administered 2022-02-18 – 2022-02-20 (×3): 75 mg via ORAL
  Filled 2022-02-17 (×3): qty 1

## 2022-02-17 SURGICAL SUPPLY — 19 items
CATH INFINITI 5FR MULTPACK ANG (CATHETERS) ×1 IMPLANT
CATH LAUNCHER 6FR AL1 (CATHETERS) IMPLANT
CATHETER LAUNCHER 6FR AL1 (CATHETERS) ×2
DEVICE SPIDERFX EMB PROT 4MM (WIRE) ×1 IMPLANT
GLIDESHEATH SLEND SS 6F .021 (SHEATH) ×1 IMPLANT
GUIDEWIRE INQWIRE 1.5J.035X260 (WIRE) IMPLANT
INQWIRE 1.5J .035X260CM (WIRE) ×2
KIT ENCORE 26 ADVANTAGE (KITS) ×1 IMPLANT
KIT HEART LEFT (KITS) ×3 IMPLANT
KIT MICROPUNCTURE NIT STIFF (SHEATH) ×1 IMPLANT
PACK CARDIAC CATHETERIZATION (CUSTOM PROCEDURE TRAY) ×3 IMPLANT
SHEATH PINNACLE 5F 10CM (SHEATH) ×1 IMPLANT
SHEATH PINNACLE 6F 10CM (SHEATH) ×1 IMPLANT
SHEATH PROBE COVER 6X72 (BAG) ×1 IMPLANT
STENT ONYX FRONTIER 3.5X12 (Permanent Stent) ×1 IMPLANT
TRANSDUCER W/STOPCOCK (MISCELLANEOUS) ×3 IMPLANT
TUBING CIL FLEX 10 FLL-RA (TUBING) ×3 IMPLANT
WIRE COUGAR XT STRL 190CM (WIRE) ×1 IMPLANT
WIRE EMERALD 3MM-J .035X150CM (WIRE) ×1 IMPLANT

## 2022-02-17 NOTE — Progress Notes (Signed)
6f sheath was removed from Mr. CMudgettRight Femoral Artery using manual pressure.  Pressure was held for 25 minutes and patient tolerated well.  No hematoma or oozing noted after pull, however, he did have a slight bruise at the site.  His nurse came in afterwards to check his groin and also noted the bruise.  Post care instructions were given to the patient and his family member.  Post vitals were good and a tegaderm was applied to the area. ?

## 2022-02-17 NOTE — Progress Notes (Signed)
Pt has returned from the cath lab. Now s/p PCI. No chest pain. Sheath in place in the right groin. Discussed findings with the patient and his sister in law who is present in the room. Vital signs stable. Will follow-up in am with anticipated DC tomorrow.  ? ?[name] ?02/17/2022 ? ?2:54 PM ? ?

## 2022-02-17 NOTE — Progress Notes (Signed)
ANTICOAGULATION CONSULT NOTE  ? ?Pharmacy Consult for Heparin infusion ?Indication: chest pain/ACS ? ?Allergies  ?Allergen Reactions  ? Codeine Other (See Comments)  ?  Pt does not remember  ? Iodine Swelling  ? ? ?Patient Measurements: ?Height: '5\' 1"'$  (154.9 cm) ?Weight: 58 kg (127 lb 13.9 oz) ?IBW/kg (Calculated) : 52.3 ?Heparin Dosing Weight: 59.9 kg ? ?Vital Signs: ?Temp: 97.9 ?F (36.6 ?C) (03/16 0536) ?Temp Source: Axillary (03/16 0536) ?BP: 125/49 (03/16 0536) ?Pulse Rate: 56 (03/16 0536) ? ?Labs: ?Recent Labs  ?  02/15/22 ?1056 02/15/22 ?1217 02/15/22 ?1217 02/15/22 ?2021 02/16/22 ?0446 02/16/22 ?1320 02/16/22 ?2155 02/17/22 ?0328  ?HGB 12.5*  --   --   --  11.9*  --   --  11.8*  ?HCT 37.6*  --   --   --  36.3*  --   --  35.8*  ?PLT 145*  --   --   --  141*  --   --  143*  ?LABPROT  --  23.1*   < >  --  22.1* 21.3*  --  19.8*  ?INR  --  2.0*   < >  --  1.9* 1.9*  --  1.7*  ?HEPARINUNFRC  --   --    < > 0.19* 0.22* 0.41 0.40 0.46  ?CREATININE 0.99  --   --   --  1.10  --   --   --   ?TROPONINIHS 700* 1,301*  --  1,757*  --   --   --   --   ? < > = values in this interval not displayed.  ? ? ? ?Estimated Creatinine Clearance: 37.6 mL/min (by C-G formula based on SCr of 1.1 mg/dL). ? ? ?Medical History: ?Past Medical History:  ?Diagnosis Date  ? 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 Aortic stenosis   ? mild with a mean aortic valve gradient of 12 mmHg  ? Atrial fibrillation ([place])   ? holding sinus rhythm on Amiodarone  ? CAD (coronary artery disease)   ? a. S/P Ant MI 1980;  b. 1997 S/P CABG x 8 (LIMA to diag-LAD, SVG to OM1-OM2-OM3, SVG to [facility] - Dr [name]);  c. 01/2015 Cath: 3VD, 8/8 patent grafts.  ? Cardiomyopathy   ? Dysrhythmia   ? Esophageal reflux   ? GERD (gastroesophageal reflux disease)   ? Headache   ? History of colonoscopy   ? History of transesophageal echocardiography (TEE) for monitoring   ? Other and unspecified hyperlipidemia   ? S/P TAVR (transcatheter aortic valve  replacement)   ? a. 03/2015 26 mm Edwards Sapien 3 transcatheter heart valve placed via open left transfemoral approach.  ? Skin lesions, generalized   ? facial which may represent actinic keratoses and possible photosensitivity from Amiodarone  ? Unspecified essential hypertension   ? ? ?Assessment: ?81 yo M presents with chest pain and elevated cardiac troponins. Pharmacy has been consulted to initiate and manage heparin infusion for ACS. Pt takes warfarin '4mg'$  po daily for atrial fibrillation at home. Last dose of warfarin was 02/15/22 ~07:00.  ? ?Heparin level this morning came back therapeutic at 0.46, on 950 units/hr. INR is 1.7. Hgb 11.8, plt 143. No s/sx of bleeding or infusion issues. ? ?Goal of Therapy:  ?Heparin level 0.3-0.7 units/ml ?Monitor platelets by anticoagulation protocol: Yes ?  ?Plan:  ?Continue IV heparin at 950 units/hr. ?Daily heparin level and CBC. ?F/u after cath. ? ?Antonietta Jewel, PharmD, BCCCP ?Clinical Pharmacist  ?Phone:  [phone] ?02/17/2022 7:52 AM ? ?Please check AMION for all [place] phone numbers ?After 10:00 PM, call [place] [phone] ? ? ? ? ? ? ?

## 2022-02-17 NOTE — Progress Notes (Signed)
ANTICOAGULATION CONSULT NOTE  ? ?Pharmacy Consult for warfarin ?Indication: atrial fibrillation ? ?Allergies  ?Allergen Reactions  ? Codeine Other (See Comments)  ?  Pt does not remember  ? Iodine Swelling  ? ? ?Patient Measurements: ?Height: '5\' 1"'$  (154.9 cm) ?Weight: 58 kg (127 lb 13.9 oz) ?IBW/kg (Calculated) : 52.3 ?Heparin Dosing Weight: 59.9 kg ? ?Vital Signs: ?Temp: 98.1 ?F (36.7 ?C) (03/16 1215) ?Temp Source: Oral (03/16 1215) ?BP: 135/61 (03/16 1215) ?Pulse Rate: 70 (03/16 1215) ? ?Labs: ?Recent Labs  ?  02/15/22 ?1056 02/15/22 ?1217 02/15/22 ?1217 02/15/22 ?2021 02/16/22 ?0446 02/16/22 ?1320 02/16/22 ?2155 02/17/22 ?0328  ?HGB 12.5*  --   --   --  11.9*  --   --  11.8*  ?HCT 37.6*  --   --   --  36.3*  --   --  35.8*  ?PLT 145*  --   --   --  141*  --   --  143*  ?LABPROT  --  23.1*   < >  --  22.1* 21.3*  --  19.8*  ?INR  --  2.0*   < >  --  1.9* 1.9*  --  1.7*  ?HEPARINUNFRC  --   --    < > 0.19* 0.22* 0.41 0.40 0.46  ?CREATININE 0.99  --   --   --  1.10  --   --   --   ?TROPONINIHS 700* 1,301*  --  1,757*  --   --   --   --   ? < > = values in this interval not displayed.  ? ? ? ?Estimated Creatinine Clearance: 37.6 mL/min (by C-G formula based on SCr of 1.1 mg/dL). ? ? ?Medical History: ?Past Medical History:  ?Diagnosis Date  ? 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 Aortic stenosis   ? mild with a mean aortic valve gradient of 12 mmHg  ? Atrial fibrillation ([place])   ? holding sinus rhythm on Amiodarone  ? CAD (coronary artery disease)   ? a. S/P Ant MI 1980;  b. 1997 S/P CABG x 8 (LIMA to diag-LAD, SVG to OM1-OM2-OM3, SVG to [facility] - Dr [name]);  c. 01/2015 Cath: 3VD, 8/8 patent grafts.  ? Cardiomyopathy   ? Dysrhythmia   ? Esophageal reflux   ? GERD (gastroesophageal reflux disease)   ? Headache   ? History of colonoscopy   ? History of transesophageal echocardiography (TEE) for monitoring   ? Other and unspecified hyperlipidemia   ? S/P TAVR (transcatheter aortic valve  replacement)   ? a. 03/2015 26 mm Edwards Sapien 3 transcatheter heart valve placed via open left transfemoral approach.  ? Skin lesions, generalized   ? facial which may represent actinic keratoses and possible photosensitivity from Amiodarone  ? Unspecified essential hypertension   ? ? ?Assessment: ?81 yo M presents with chest pain and elevated cardiac troponins. Pharmacy consulted to start warfarin back post cath. Pt takes warfarin '4mg'$  po daily for atrial fibrillation at home. Last dose of warfarin was 02/15/22 ~07:00.  ? ?Underwent cardiac cath 3/16 severe CAD - now successful PTCA/DES to SVG to PDA. INR is subtherapeutic at 1.7. Hgb 11.8, plt 143. No s/sx of bleeding.  ? ?Goal of Therapy:  ?Heparin level 0.3-0.7 units/ml ?Monitor platelets by anticoagulation protocol: Yes ?  ?Plan:  ?Start warfarin 5 mg tonight  ?Plan to continue aspirin and plavix until therapeutic on warfarin - then discontinue aspirin ?Monitor daily INR, CBC, and for  s/sx of bleeding  ? ?Antonietta Jewel, PharmD, BCCCP ?Clinical Pharmacist  ?Phone: [phone] ?02/17/2022 12:58 PM ? ?Please check AMION for all [place] phone numbers ?After 10:00 PM, call [place] [phone] ? ? ? ? ? ? ?

## 2022-02-17 NOTE — Interval H&P Note (Signed)
History and Physical Interval Note: ? ?02/17/2022 ?7:48 AM ? ?Jesus [name]  has presented today for surgery, with the diagnosis of chest pain.  The various methods of treatment have been discussed with the patient and family. After consideration of risks, benefits and other options for treatment, the patient has consented to  Procedure(s): ?LEFT HEART CATH AND CORS/GRAFTS ANGIOGRAPHY (N/A) as a surgical intervention.  The patient's history has been reviewed, patient examined, no change in status, stable for surgery.  I have reviewed the patient's chart and labs.  Questions were answered to the patient's satisfaction.   ? ?Cath Lab Visit (complete for each Cath Lab visit) ? ?Clinical Evaluation Leading to the Procedure:  ? ?ACS: Yes.   ? ?Non-ACS:   ? ?Anginal Classification: CCS III ? ?Anti-ischemic medical therapy: Minimal Therapy (1 class of medications) ? ?Non-Invasive Test Results: No non-invasive testing performed ? ?Prior CABG: Previous CABG ? ? ? ? ? ? ? ?Lauree Chandler ? ? ?

## 2022-02-18 DIAGNOSIS — I4729 Other ventricular tachycardia: Secondary | ICD-10-CM | POA: Diagnosis not present

## 2022-02-18 LAB — BASIC METABOLIC PANEL
Anion gap: 11 (ref 5–15)
BUN: 29 mg/dL — ABNORMAL HIGH (ref 8–23)
CO2: 22 mmol/L (ref 22–32)
Calcium: 9 mg/dL (ref 8.9–10.3)
Chloride: 102 mmol/L (ref 98–111)
Creatinine, Ser: 1.57 mg/dL — ABNORMAL HIGH (ref 0.61–1.24)
GFR, Estimated: 43 mL/min — ABNORMAL LOW (ref >60)
Glucose, Bld: 177 mg/dL — ABNORMAL HIGH (ref 70–99)
Potassium: 4.8 mmol/L (ref 3.5–5.1)
Sodium: 135 mmol/L (ref 135–145)

## 2022-02-18 LAB — PROTIME-INR
INR: 1.4 — ABNORMAL HIGH (ref 0.8–1.2)
Prothrombin Time: 17.4 seconds — ABNORMAL HIGH (ref 11.4–15.2)

## 2022-02-18 LAB — TROPONIN I (HIGH SENSITIVITY)
Troponin I (High Sensitivity): 230 ng/L (ref <18)
Troponin I (High Sensitivity): 711 ng/L (ref <18)

## 2022-02-18 LAB — CBC
HCT: 39.4 % (ref 39.0–52.0)
Hemoglobin: 13.3 g/dL (ref 13.0–17.0)
MCH: 33.5 pg (ref 26.0–34.0)
MCHC: 33.8 g/dL (ref 30.0–36.0)
MCV: 99.2 fL (ref 80.0–100.0)
Platelets: 158 10*3/uL (ref 150–400)
RBC: 3.97 MIL/uL — ABNORMAL LOW (ref 4.22–5.81)
RDW: 13.1 % (ref 11.5–15.5)
WBC: 15.4 10*3/uL — ABNORMAL HIGH (ref 4.0–10.5)
nRBC: 0 % (ref 0.0–0.2)

## 2022-02-18 LAB — MAGNESIUM: Magnesium: 2.3 mg/dL (ref 1.7–2.4)

## 2022-02-18 LAB — POCT ACTIVATED CLOTTING TIME: Activated Clotting Time: 185 seconds

## 2022-02-18 MED ORDER — MAGNESIUM SULFATE 2 GM/50ML IV SOLN
2.0000 g | Freq: Once | INTRAVENOUS | Status: AC
  Administered 2022-02-18: 2 g via INTRAVENOUS
  Filled 2022-02-18: qty 50

## 2022-02-18 MED ORDER — WARFARIN SODIUM 5 MG PO TABS
5.0000 mg | ORAL_TABLET | Freq: Once | ORAL | Status: AC
  Administered 2022-02-18: 5 mg via ORAL
  Filled 2022-02-18: qty 1

## 2022-02-18 MED ORDER — AMIODARONE HCL IN DEXTROSE 360-4.14 MG/200ML-% IV SOLN
30.0000 mg/h | INTRAVENOUS | Status: DC
  Administered 2022-02-18 – 2022-02-19 (×3): 30 mg/h via INTRAVENOUS
  Filled 2022-02-18 (×2): qty 200

## 2022-02-18 MED ORDER — AMIODARONE HCL IN DEXTROSE 360-4.14 MG/200ML-% IV SOLN
60.0000 mg/h | INTRAVENOUS | Status: AC
  Administered 2022-02-18: 60 mg/h via INTRAVENOUS
  Filled 2022-02-18 (×2): qty 200

## 2022-02-18 MED ORDER — AMIODARONE IV BOLUS ONLY 150 MG/100ML
150.0000 mg | Freq: Once | INTRAVENOUS | Status: AC
  Administered 2022-02-18: 150 mg via INTRAVENOUS
  Filled 2022-02-18: qty 100

## 2022-02-18 MED ORDER — METOPROLOL TARTRATE 25 MG PO TABS
25.0000 mg | ORAL_TABLET | Freq: Two times a day (BID) | ORAL | Status: DC
  Administered 2022-02-18 – 2022-02-20 (×5): 25 mg via ORAL
  Filled 2022-02-18 (×6): qty 1

## 2022-02-18 MED FILL — Verapamil HCl IV Soln 2.5 MG/ML: INTRAVENOUS | Qty: 2 | Status: AC

## 2022-02-18 NOTE — Progress Notes (Signed)
ANTICOAGULATION CONSULT NOTE  ? ?Pharmacy Consult for warfarin ?Indication: atrial fibrillation ? ?Allergies  ?Allergen Reactions  ? Codeine Other (See Comments)  ?  Pt does not remember  ? Iodine Swelling  ? ? ?Patient Measurements: ?Height: '5\' 1"'$  (154.9 cm) ?Weight: 58 kg (127 lb 13.9 oz) ?IBW/kg (Calculated) : 52.3 ?Heparin Dosing Weight: 59.9 kg ? ?Vital Signs: ?Temp: 97.9 ?F (36.6 ?C) (03/17 0525) ?Temp Source: Oral (03/17 0525) ?BP: 133/52 (03/17 0525) ?Pulse Rate: 59 (03/17 0525) ? ?Labs: ?Recent Labs  ?  02/15/22 ?1056 02/15/22 ?1217 02/15/22 ?2021 02/16/22 ?0446 02/16/22 ?1320 02/16/22 ?2155 02/17/22 ?0328 02/18/22 ?0025 02/18/22 ?0246  ?HGB 12.5*  --   --  11.9*  --   --  11.8* 13.3  --   ?HCT 37.6*  --   --  36.3*  --   --  35.8* 39.4  --   ?PLT 145*  --   --  141*  --   --  143* 158  --   ?LABPROT  --    < >  --  22.1* 21.3*  --  19.8* 17.4*  --   ?INR  --    < >  --  1.9* 1.9*  --  1.7* 1.4*  --   ?HEPARINUNFRC  --    < > 0.19* 0.22* 0.41 0.40 0.46  --   --   ?CREATININE 0.99  --   --  1.10  --   --   --  1.57*  --   ?TROPONINIHS 700*   < > 1,757*  --   --   --   --  230* 711*  ? < > = values in this interval not displayed.  ? ? ? ?Estimated Creatinine Clearance: 26.4 mL/min (A) (by C-G formula based on SCr of 1.57 mg/dL (H)). ? ? ?Medical History: ?Past Medical History:  ?Diagnosis Date  ? 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 Aortic stenosis   ? mild with a mean aortic valve gradient of 12 mmHg  ? Atrial fibrillation ([place])   ? holding sinus rhythm on Amiodarone  ? CAD (coronary artery disease)   ? a. S/P Ant MI 1980;  b. 1997 S/P CABG x 8 (LIMA to diag-LAD, SVG to OM1-OM2-OM3, SVG to [facility] - Dr [name]);  c. 01/2015 Cath: 3VD, 8/8 patent grafts.  ? Cardiomyopathy   ? Dysrhythmia   ? Esophageal reflux   ? GERD (gastroesophageal reflux disease)   ? Headache   ? History of colonoscopy   ? History of transesophageal echocardiography (TEE) for monitoring   ? Other and unspecified  hyperlipidemia   ? S/P TAVR (transcatheter aortic valve replacement)   ? a. 03/2015 26 mm Edwards Sapien 3 transcatheter heart valve placed via open left transfemoral approach.  ? Skin lesions, generalized   ? facial which may represent actinic keratoses and possible photosensitivity from Amiodarone  ? Unspecified essential hypertension   ? ? ?Assessment: ?81 yo M presents with chest pain and elevated cardiac troponins. Pharmacy consulted to start warfarin back post cath. Pt takes warfarin '4mg'$  po daily for atrial fibrillation at home. Last dose of warfarin was 02/15/22 ~07:00.  ? ?Underwent cardiac cath 3/16 severe CAD - now successful PTCA/DES to SVG to PDA. INR is subtherapeutic at 1.7. Hgb 11.8, plt 143. No s/sx of bleeding.  ? ?Goal of Therapy:  ?Heparin level 0.3-0.7 units/ml ?Monitor platelets by anticoagulation protocol: Yes ?  ?Plan:  ?Repeat warfarin 5 mg tonight  ?Plan  to continue aspirin and plavix until therapeutic on warfarin - then discontinue aspirin ?Monitor daily INR, CBC, and for s/sx of bleeding  ? ?[place] Crane, Pharm D, BCPS, BCCP ?Clinical Pharmacist ? 02/18/2022 8:54 AM  ? ?[facility] pharmacy phone numbers are listed on [email or web address] ? ? ? ? ? ? ? ?

## 2022-02-18 NOTE — Consult Note (Addendum)
?Cardiology Consultation:  ? ?Patient ID: Jesus Jesus [name] ?MRN: [id number]; DOB: 1941/10/14 ? ?Admit date: 02/15/2022 ?Date of Consult: 02/18/2022 ? ?PCP:  Jesus Post, MD ?  ?[facility] Providers ?Cardiologist:  Jesus Mocha, MD  ? ? ?Patient Profile:  ? ?Jesus Jesus [name] is a 81 y.o. male with a hx of CAD (CABG 1997), VHD (TAVR 2016), AFib, HTN, HLD, ICM, who is being seen 02/18/2022 for the evaluation of recurrent NSVT at the request of Dr. Burt Jesus [name]. ? ?History of Present Illness:  ? ?Jesus Jesus [name] was admitetd to [facility] 02/15/22 with CP, HS Trop/EKGs c/w NSTEMI, warfarin held to allow cath > transitioned to heparin. ? ?Cath yesterday with severe native CAD,  SVG to  PDA described as severe stenosis and DES was placed. ? ?Last evening developed very frequent runs of NSVT and given a bolus w/IV amiodarone x1 with reduction in burden, though remains with fairly frequent couplets. ? ?EP is asked to weigh in on management. ? ?He is chronically on low dose amiodarone '100mg'$  daily for his Afib hx, Dr. Burt Jesus [name] is not suspicious for re profusion arrhythmia with an estimated 7-% lesion, not a critical lesion.  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 ?LABS from overnight ?K+ 4.8 ?Mag 2.3  ?BUN/Creat 29/1.57 ?WBC 15.4 (7.8 yesterday) ?H/H 13/39 ?Plts 158 ?INR 1.4 (with ASA, plavix) ? ?He is aware of the ectopy, stills feels them a little, but better then last night. ?He has not had any kind of CP, not SOB ? ? ?Past Medical History:  ?Diagnosis Date  ? 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 Aortic stenosis   ? mild with a mean aortic valve gradient of 12 mmHg  ? Atrial fibrillation ([place])   ? holding sinus rhythm on Amiodarone  ? CAD (coronary artery disease)   ? a. S/P Ant MI 1980;  b. 1997 S/P CABG x 8 (LIMA to diag-LAD, SVG to OM1-OM2-OM3, SVG to [facility] - Dr Jesus Jesus [name]);  c. 01/2015 Cath: 3VD, 8/8 patent grafts.  ? Cardiomyopathy   ? Dysrhythmia   ? Esophageal reflux   ? GERD (gastroesophageal  reflux disease)   ? Headache   ? History of colonoscopy   ? History of transesophageal echocardiography (TEE) for monitoring   ? Other and unspecified hyperlipidemia   ? S/P TAVR (transcatheter aortic valve replacement)   ? a. 03/2015 26 mm Edwards Sapien 3 transcatheter heart valve placed via open left transfemoral approach.  ? Skin lesions, generalized   ? facial which Jesus [name] represent actinic keratoses and possible photosensitivity from Amiodarone  ? Unspecified essential hypertension   ? ? ?Past Surgical History:  ?Procedure Laterality Date  ? CARDIAC CATHETERIZATION    ? CARDIOVERSION  11/18/2006  ? Dr. Orene Jesus [name]  ? CATARACT EXTRACTION W/ INTRAOCULAR LENS IMPLANT  April '13  (Dr. Kathrin Jesus [name])  ? left eye only  ? CHOLECYSTECTOMY    ? CORONARY ARTERY BYPASS GRAFT  02/09/1996  ? LIMA to diag-LAD, SVG to OM1-OM2-OM3, SVG to [facility]  ? CORONARY STENT INTERVENTION N/A 02/17/2022  ? Procedure: CORONARY STENT INTERVENTION;  Surgeon: Jesus Blanks, MD;  Location: [place] CV LAB;  Service: Cardiovascular;  Laterality: N/A;  ? CORONARY/GRAFT ANGIOGRAPHY N/A 02/17/2022  ? Procedure: CORONARY/GRAFT ANGIOGRAPHY;  Surgeon: Jesus Blanks, MD;  Location: [place] CV LAB;  Service: Cardiovascular;  Laterality: N/A;  ? EYE SURGERY    ? LEFT AND RIGHT HEART CATHETERIZATION WITH CORONARY/GRAFT ANGIOGRAM N/A 01/26/2015  ? Procedure:  LEFT AND RIGHT HEART CATHETERIZATION WITH [name];  Surgeon: Jesus Ohara, MD;  Location: [facility] CATH LAB;  Service: Cardiovascular;  Laterality: N/A;  ? TEE WITHOUT CARDIOVERSION N/A 03/10/2015  ? Procedure: TRANSESOPHAGEAL ECHOCARDIOGRAM (TEE);  Surgeon: Jesus Mocha, MD;  Location: [place];  Service: Open Heart Surgery;  Laterality: N/A;  ? TONSILLECTOMY    ? TRANSCATHETER AORTIC VALVE REPLACEMENT, TRANSFEMORAL N/A 03/10/2015  ? Procedure: TRANSCATHETER AORTIC VALVE REPLACEMENT, TRANSFEMORAL;  Surgeon: Jesus Mocha, MD;  Location: [place];  Service: Open Heart  Surgery;  Laterality: N/A;  ?  ? ?Home Medications:  ?Prior to Admission medications   ?Medication Sig Start Date End Date Taking? Authorizing Provider  ?acetaminophen (TYLENOL) 500 MG tablet Take 500 mg by mouth every 6 (six) hours as needed for moderate pain.   Yes [provider]  ?amiodarone (PACERONE) 200 MG tablet TAKE 1/2 TABLET BY MOUTH DAILY 03/09/21  Yes Jesus Mocha, MD  ?amLODipine (NORVASC) 5 MG tablet TAKE 1 TABLET BY MOUTH ONCE DAILY 03/09/21  Yes Jesus Mocha, MD  ?Ascorbic Acid (VITAMIN C) 1000 MG tablet Take 1,000 mg by mouth daily.   Yes [provider]  ?aspirin 81 MG tablet Take 81 mg by mouth at bedtime.   Yes [provider]  ?atorvastatin (LIPITOR) 80 MG tablet TAKE 1/2 TABLET BY MOUTH DAILY 06/01/21  Yes Jesus [name], Jesus Sierras, MD  ?loratadine (CLARITIN) 10 MG tablet Take 10 mg by mouth daily as needed for allergies.   Yes [provider]  ?losartan (COZAAR) 100 MG tablet TAKE 1 TABLET BY MOUTH EVERY DAY 12/07/21  Yes Jesus [name], Jesus Sierras, MD  ?Multiple Vitamins-Minerals (MULTIVITAMIN,TX-MINERALS) tablet Take 1 tablet by mouth daily.   Yes [provider]  ?warfarin (COUMADIN) 4 MG tablet TAKE 1 TABLET BY MOUTH DAILY OR TAKE AS DIRECTED BY ANTICOAGULATION CLINIC 01/10/22  Yes Jesus [name], Jesus Sierras, MD  ?albuterol (VENTOLIN HFA) 108 (90 Base) MCG/ACT inhaler INHALE 2 PUFFS BY MOUTH EVERY 4 HOURS ASNEEDED FOR WHEEZING OR SHORTNESS OF BREATH. 01/27/21   Jesus [name], Jesus Sierras, MD  ?amoxicillin (AMOXIL) 400 MG/5ML suspension TAKE 5 TEASPOONFULS BY MOUTH 1 HOUR PRIOR TO DENTAL PROCEDURE ?Patient not taking: Reported on 02/15/2022 04/19/21   Jesus Post, MD  ? ? ?Inpatient Medications: ?Scheduled Meds: ? amLODipine  10 mg Oral Daily  ? vitamin C  1,000 mg Oral Daily  ? aspirin EC  81 mg Oral Daily  ? atorvastatin  80 mg Oral Daily  ? clopidogrel  75 mg Oral Q breakfast  ? metoprolol tartrate  25 mg Oral BID  ? multivitamin with minerals  1 tablet Oral Daily  ?  sodium chloride flush  3 mL Intravenous Q12H  ? warfarin  5 mg Oral ONCE-1600  ? Warfarin - Pharmacist Dosing Inpatient   Does not apply [id number]  ? ?Continuous Infusions: ? sodium chloride    ? amiodarone    ? Followed by  ? amiodarone    ? ?PRN Meds: ?sodium chloride, acetaminophen, loratadine, nitroGLYCERIN, ondansetron (ZOFRAN) IV, sodium chloride flush ? ?Allergies:    ?Allergies  ?Allergen Reactions  ? Codeine Other (See Comments)  ?  Pt does not remember  ? Iodine Swelling  ? ? ?Social History:   ?Social History  ? ?Socioeconomic History  ? Marital status: Married  ?  Spouse name: Not on file  ? Number of children: Not on file  ? Years of education: Not on file  ? Highest education level: Not on file  ?Occupational History  ?  Not on file  ?Tobacco Use  ? Smoking status: Never  ? Smokeless tobacco: Never  ? Tobacco comments:  ?  Does not smoke.  ?Vaping Use  ? Vaping Use: Never used  ?Substance and Sexual Activity  ? Alcohol use: No  ?  Alcohol/week: 0.0 standard drinks  ? Drug use: No  ? Sexual activity: Not Currently  ?Other Topics Concern  ? Not on file  ?Social History Narrative  ? HSG. [place] 6 years. Married - '69 - 1 year/divorced. '76 - . No children. Work - mfg/textiles - Designer, multimedia; currently works doing maintenance. [employer] - they have discussed this. Provided packet august '13.   ?   ?   ?   ?   ? ?Social Determinants of Health  ? ?Financial Resource Strain: Low Risk   ? Difficulty of Paying Living Expenses: Not hard at all  ?Food Insecurity: No Food Insecurity  ? Worried About Charity fundraiser in the Last Year: Never true  ? Ran Out of Food in the Last Year: Never true  ?Transportation Needs: No Transportation Needs  ? Lack of Transportation (Medical): No  ? Lack of Transportation (Non-Medical): No  ?Physical Activity: Sufficiently Active  ? Days of Exercise per Week: 5 days  ? Minutes of Exercise per Session: 60 min  ?Stress: No Stress Concern Present  ? Feeling of Stress : Not  at all  ?Social Connections: Moderately Integrated  ? Frequency of Communication with Friends and Family: Three times a week  ? Frequency of Social Gatherings with Friends and Family: Three times a week  ? Atte

## 2022-02-18 NOTE — Progress Notes (Addendum)
Pt started slipping into runs consistent 5-beat runs of Vtach around 2316. The rhythm would be 3 normal beats followed by 5 beats of Vtach and followed that pattern. Upon assessment the pt had been moving around in bed and once he laid back down he went back into NSR. Vitals all stable. I paged on call cardiology to let them know and they advised to just monitor pt for now. Pt slipped into this same irregular rhythm again around 2340 and has sustained the rhythm - 3 normal beats followed by 4-5 beats of Vtach. Cardiology and rapid were paged. Dr. Alveta Heimlich came up to see the pt and saw that he was asymptomatic and vitals were stable. She ordered a 111m bolus of amiodarone and stat BMP and Mag levels. Rapid response nurse came to assess the pt and saw he was asymptomatic and they we had amiodarone hanging. He seemed unconcerned and told uKoreato call him again if the pts condition worsened. Bolus of amiodarone was given with no change in the rhythm. Labs have been drawn - pending results. Will continue to monitor pt and notify MD of lab values when they result.  ?

## 2022-02-18 NOTE — Progress Notes (Addendum)
? ?Progress Note ? ?Patient Name: Jesus [name] ?Date of Encounter: 02/18/2022 ? ?[facility] Cardiologist: [name], MD  ? ?Subjective  ? ?No chest pain or shortness of breath.  Sitting up in a chair at the bedside.  Family at the bedside this morning.  He has not experienced palpitations. ? ?Inpatient Medications  ?  ?Scheduled Meds: ? amLODipine  10 mg Oral Daily  ? vitamin C  1,000 mg Oral Daily  ? aspirin EC  81 mg Oral Daily  ? atorvastatin  80 mg Oral Daily  ? clopidogrel  75 mg Oral Q breakfast  ? metoprolol tartrate  25 mg Oral BID  ? multivitamin with minerals  1 tablet Oral Daily  ? sodium chloride flush  3 mL Intravenous Q12H  ? warfarin  5 mg Oral ONCE-1600  ? Warfarin - Pharmacist Dosing Inpatient   Does not apply [id number]  ? ?Continuous Infusions: ? sodium chloride    ? amiodarone    ? Followed by  ? amiodarone    ? ?PRN Meds: ?sodium chloride, acetaminophen, loratadine, nitroGLYCERIN, ondansetron (ZOFRAN) IV, sodium chloride flush  ? ?Vital Signs  ?  ?Vitals:  ? 02/17/22 1950 02/18/22 0112 02/18/22 0525 02/18/22 5102  ?BP: 122/71 (!) 116/44 (!) 133/52 (!) 116/52  ?Pulse: 84 94 (!) 59 (!) 58  ?Resp:    20  ?Temp: 98.2 ?F (36.8 ?C)  97.9 ?F (36.6 ?C) 97.7 ?F (36.5 ?C)  ?TempSrc: Oral  Oral Oral  ?SpO2: 97%  100% 93%  ?Weight:      ?Height:      ? ? ?Intake/Output Summary (Last 24 hours) at 02/18/2022 1247 ?Last data filed at 02/18/2022 0913 ?Gross per 24 hour  ?Intake 268.91 ml  ?Output --  ?Net 268.91 ml  ? ?Last 3 Weights 02/17/2022 02/16/2022 02/15/2022  ?Weight (lbs) 127 lb 13.9 oz 128 lb 129 lb 9.6 oz  ?Weight (kg) 58 kg 58.06 kg 58.786 kg  ?   ? ?Telemetry  ?  ?Sinus rhythm with frequent and consecutive PVCs, frequent ventricular couplets - Personally Reviewed ? ?ECG  ?  ?Sinus bradycardia 59 bpm, nonspecific IVCD.  Personally Reviewed ? ?Physical Exam  ?Alert, oriented, elderly male in no distress ?GEN: No acute distress.   ?Neck: No JVD ?Cardiac: RRR, 2/6 systolic murmur at the right upper  sternal border and 2/6 diastolic decrescendo murmur at the right upper sternal border ?Respiratory: Clear to auscultation bilaterally. ?GI: Soft, nontender, non-distended  ?MS: No edema; No deformity. ?Neuro:  Nonfocal  ?Psych: Normal affect  ? ?Labs  ?  ?High Sensitivity Troponin:   ?Recent Labs  ?Lab 02/15/22 ?1056 02/15/22 ?1217 02/15/22 ?2021 02/18/22 ?0025 02/18/22 ?0246  ?TROPONINIHS 700* 1,301* 1,757* 230* 711*  ?   ?Chemistry ?Recent Labs  ?Lab 02/15/22 ?1056 02/16/22 ?0446 02/18/22 ?0025  ?NA 138 138 135  ?K 4.4 4.0 4.8  ?CL 106 104 102  ?CO2 '25 25 22  '$ ?GLUCOSE 99 99 177*  ?BUN 15 18 29*  ?CREATININE 0.99 1.10 1.57*  ?CALCIUM 9.2 8.8* 9.0  ?MG  --   --  2.3  ?GFRNONAA >60 >60 43*  ?ANIONGAP '7 9 11  '$ ?  ?Lipids  ?Recent Labs  ?Lab 02/16/22 ?0446  ?CHOL 155  ?TRIG 68  ?HDL 49  ?[place] 92  ?CHOLHDL 3.2  ?  ?Hematology ?Recent Labs  ?Lab 02/16/22 ?0446 02/17/22 ?0328 02/18/22 ?0025  ?WBC 7.4 7.8 15.4*  ?RBC 3.65* 3.60* 3.97*  ?HGB 11.9* 11.8* 13.3  ?HCT 36.3* 35.8*  39.4  ?MCV 99.5 99.4 99.2  ?MCH 32.6 32.8 33.5  ?MCHC 32.8 33.0 33.8  ?RDW 13.2 13.3 13.1  ?PLT 141* 143* 158  ? ?Thyroid No results for input(s): TSH, FREET4 in the last 168 hours.  ?BNPNo results for input(s): BNP, PROBNP in the last 168 hours.  ?DDimer No results for input(s): DDIMER in the last 168 hours.  ? ?Radiology  ?  ?CARDIAC CATHETERIZATION ? ?Result Date: 02/17/2022 ?  Ost RCA to Prox RCA lesion is 100% stenosed.   Prox Graft lesion before RV Branch  is 80% stenosed.   RPAV lesion is 100% stenosed.   Ost LM to Mid LM lesion is 100% stenosed.   3rd Mrg lesion is 80% stenosed.   A drug-eluting stent was successfully placed using a STENT ONYX FRONTIER 3.5X12.   Post intervention, there is a 10% residual stenosis.   SVG graft was visualized by angiography.   SVG graft was visualized by angiography.   LIMA graft was visualized by angiography. Severe native vessel CAD s/p 6V CABG with 6/6 patent grafts. 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 There is a severe stenosis in the proximal body of the SVG to the PDA. Successful PTCA/DES x 1 proximal body of SVG to PDA using a distal protection device. Recommendations: Continue ASA/Plavix until he is therapeutic on coumadin and can then consider stopping ASA while continuing Plavix with coumadin.   ? ?Cardiac Studies  ? ?Cardiac catheterization films were reviewed ? ?Patient Profile  ?   ?81 y.o. male with history of CAD s/p CABG in 1997, severe aortic stenosis s/p TAVR with perivalvular leak 03/2015, paroxysmal atrial fibrillation on warfarin and amiodarone, hyperlipidemia, hypertension who was seen 02/15/2022 for the evaluation of Chest pain and fatigue. ? ?Assessment & Plan  ?  ?1.  Non-STEMI: Troponin peak at 1757.  Patient now status post PCI of the saphenous vein graft to PDA using distal protection.  Repeat troponin shows a modest increase in high-sensitivity troponin from 230 up to 711 post procedure.  This is an expected degree of troponin increase after of vein graft intervention.  The patient has not had any ischemic symptoms of chest pain or ischemic EKG changes.  However, he had a marked increase in ventricular ectopy overnight (see below).  He will be treated with triple anticoagulant therapy using warfarin, aspirin, and clopidogrel for a brief period of time.  Once his INR is therapeutic, I would recommend that he stop aspirin within a few weeks.  He will be maintained on warfarin and clopidogrel. ?2.  Nonsustained VT: Patient with baseline scar burden after remote inferior MI, known LVEF in the range of 35 to 40%.  He had frequent nonsustained VT overnight within 24 hours of PCI.  He required an IV amiodarone bolus overnight which did improve the  degree of ectopy.  However, even this morning he is having frequent ventricular couplets.  There were no other associated symptoms.  Magnesium and potassium levels are 2.3 and 4.8, respectively.  We will give the patient IV amiodarone today.  He is chronically on low-dose amiodarone 100 mg for paroxysmal atrial fibrillation.  Would anticipate increasing his outpatient amiodarone dose.  We will place  a formal EP consult for further recommendations. ?3.  Acute kidney injury: Creatinine increased from 0.99 on 3/14 up to 1.57 this morning.  The patient is not oliguric.  Seems too early for contrast nephropathy.  We will repeat labs tomorrow morning.  Avoid any nephrotoxic drugs. ?4.  Paroxysmal atrial fibrillation: Continue amiodarone as outlined above.  Increased dose for ventricular arrhythmia.  Patient restarted on warfarin INR today is 1.4. ?5. 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 No congestive symptoms.  ? ?For questions or updates, please contact [place] ?Please consult [email or web address] for contact info under  ? ?  ?   ?Signed, ?[name], MD  ?02/18/2022, 12:47 PM   ? ?

## 2022-02-18 NOTE — Progress Notes (Signed)
Cardiology On-Call ?Notified by nursing staff that pt is having runs of intermittent NSVT on telemetry monitoring. Pt is completely asymptomatic. ?Telemetry shows intermittent NSVT, usually in 5-beat increments, separated by periods of normal sinus.  ?Pt already on PO amiodarone, currently dosed at '100mg'$  PO daily. He does have h/o PVC and wore recent ziopatch showing ~2% VE burden but no significant NSVT. ?Will bolus w/ amiodarone '150mg'$  IV x 1, give Mag 2g IV x 1 dose. Check K/Mag ?Will cycle HS trop ?Will start metoprolol '25mg'$  bid, 1st dose now ? ?Rudean Curt, MD , [facility] ?02/18/22 ?12:32 AM ? ?

## 2022-02-18 NOTE — Progress Notes (Signed)
Paged Dr. Hassell Done to f/u on Mr. Berger. 150 Amio bolus, bag of mag sulfate, and metoprolol given. Notified her that his rhythm has gotten better but is still irregular. It is now a few normal beats followed by 1-2 beats that look like Vtach. Pt remains asymptomatic and vitals are all normal (HR in 80s and last BP 116/44, MAP 67) Dr. Hassell Done does not want to treat further at the moment and told me to let her know if pt condition worsens. Will continue to monitor pt.  ?

## 2022-02-18 NOTE — Progress Notes (Signed)
Mobility Specialist Progress Note  ? ? 02/18/22 1523  ?Mobility  ?Activity Contraindicated/medical hold  ? ?RN advised to hold off. Will f/u as schedule permits.  ? ?Hildred Alamin ?Mobility Specialist  ?M.S. 5N: [phone]  ?

## 2022-02-18 NOTE — Progress Notes (Signed)
CARDIAC REHAB PHASE I  ? ?PRE:  Rate/Rhythm: 60 SR ? ?  BP: sitting 122/59 ? ?  SaO2: 94 RA ? ?MODE:  Ambulation: 400  ft  ? ?POST:  Rate/Rhythm: 70 SR ? ?  BP: sitting 130/62  ? ?  SaO2: 100 RA ? ?Pt without PVCs in bed this am. Able to walk without c/o. He did start having an occasional PVC approximately every 6 sec on return from walk. Asx.  To recliner. Discussed with pt and family MI, stent, Plavix importance, restrictions, walking/exercise, NTG, and CRPII. Pt receptive. Will refer to [facility]. ?[id number] ? ?[place], ACSM ?02/18/2022 ?10:53 AM ? ? ? ? ?

## 2022-02-18 NOTE — Care Management Important Message (Signed)
Important Message ? ?Patient Details  ?Name: Jesus [name] ?MRN: [id number] ?Date of Birth: 17-Dec-1940 ? ? ?Medicare Important Message Given:  Yes ? ? ? ? ?Shelda Altes ?02/18/2022, 9:35 AM ?

## 2022-02-18 NOTE — Plan of Care (Signed)
?  Problem: Education: ?Goal: Understanding of CV disease, CV risk reduction, and recovery process will improve ?Outcome: Progressing ?  ?Problem: Activity: ?Goal: Ability to return to baseline activity level will improve ?Outcome: Progressing ?  ?Problem: Cardiovascular: ?Goal: Ability to achieve and maintain adequate cardiovascular perfusion will improve ?Outcome: Progressing ?  ?Problem: Clinical Measurements: ?Goal: Cardiovascular complication will be avoided ?Outcome: Progressing ?  ?

## 2022-02-19 ENCOUNTER — Encounter (HOSPITAL_COMMUNITY): Payer: Self-pay | Admitting: Cardiology

## 2022-02-19 ENCOUNTER — Other Ambulatory Visit: Payer: Self-pay

## 2022-02-19 DIAGNOSIS — I472 Ventricular tachycardia, unspecified: Secondary | ICD-10-CM

## 2022-02-19 LAB — PROTIME-INR
INR: 2.1 — ABNORMAL HIGH (ref 0.8–1.2)
Prothrombin Time: 23.2 seconds — ABNORMAL HIGH (ref 11.4–15.2)

## 2022-02-19 MED ORDER — WARFARIN SODIUM 2 MG PO TABS
4.0000 mg | ORAL_TABLET | Freq: Once | ORAL | Status: AC
  Administered 2022-02-19: 4 mg via ORAL
  Filled 2022-02-19: qty 2

## 2022-02-19 NOTE — Progress Notes (Signed)
? ?Progress Note ? ?Patient Name: Jesus [name] ?Date of Encounter: 02/19/2022 ? ?Primary Cardiologist: [name], MD  ? ?Subjective  ? ?No chest pain or sob. Minimal palpitations.  ? ?Inpatient Medications  ?  ?Scheduled Meds: ? amLODipine  10 mg Oral Daily  ? vitamin C  1,000 mg Oral Daily  ? atorvastatin  80 mg Oral Daily  ? clopidogrel  75 mg Oral Q breakfast  ? metoprolol tartrate  25 mg Oral BID  ? multivitamin with minerals  1 tablet Oral Daily  ? sodium chloride flush  3 mL Intravenous Q12H  ? warfarin  4 mg Oral ONCE-1600  ? Warfarin - Pharmacist Dosing Inpatient   Does not apply [id number]  ? ?Continuous Infusions: ? sodium chloride    ? amiodarone 30 mg/hr (02/19/22 0727)  ? ?PRN Meds: ?sodium chloride, acetaminophen, loratadine, nitroGLYCERIN, ondansetron (ZOFRAN) IV, sodium chloride flush  ? ?Vital Signs  ?  ?Vitals:  ? 02/18/22 2212 02/18/22 2312 02/19/22 0406 02/19/22 0857  ?BP: (!) 106/44 (!) 109/42 (!) 110/58 (!) 128/54  ?Pulse:   (!) 58 61  ?Resp:      ?Temp:   98.3 ?F (36.8 ?C)   ?TempSrc:   Oral   ?SpO2:   97%   ?Weight:      ?Height:      ? ? ?Intake/Output Summary (Last 24 hours) at 02/19/2022 1020 ?Last data filed at 02/19/2022 0533 ?Gross per 24 hour  ?Intake 455 ml  ?Output 550 ml  ?Net -95 ml  ? ?Filed Weights  ? 02/15/22 1832 02/16/22 0500 02/17/22 0618  ?Weight: 58.8 kg 58.1 kg 58 kg  ? ? ?Telemetry  ?  ?NSR with PVC's in couplets - Personally Reviewed ? ?ECG  ?  ?none - Personally Reviewed ? ?Physical Exam  ? ?GEN: No acute distress.   ?Neck: No JVD ?Cardiac: IRRR, no murmurs, rubs, or gallops.  ?Respiratory: Clear to auscultation bilaterally. ?GI: Soft, nontender, non-distended  ?MS: No edema; No deformity. ?Neuro:  Nonfocal  ?Psych: Normal affect  ? ?Labs  ?  ?Chemistry ?Recent Labs  ?Lab 02/15/22 ?1056 02/16/22 ?0446 02/18/22 ?0025  ?NA 138 138 135  ?K 4.4 4.0 4.8  ?CL 106 104 102  ?CO2 '25 25 22  '$ ?GLUCOSE 99 99 177*  ?BUN 15 18 29*  ?CREATININE 0.99 1.10 1.57*  ?CALCIUM 9.2 8.8* 9.0   ?GFRNONAA >60 >60 43*  ?ANIONGAP '7 9 11  '$ ?  ? ?Hematology ?Recent Labs  ?Lab 02/16/22 ?0446 02/17/22 ?0328 02/18/22 ?0025  ?WBC 7.4 7.8 15.4*  ?RBC 3.65* 3.60* 3.97*  ?HGB 11.9* 11.8* 13.3  ?HCT 36.3* 35.8* 39.4  ?MCV 99.5 99.4 99.2  ?MCH 32.6 32.8 33.5  ?MCHC 32.8 33.0 33.8  ?RDW 13.2 13.3 13.1  ?PLT 141* 143* 158  ? ? ?Cardiac EnzymesNo results for input(s): TROPONINI in the last 168 hours. No results for input(s): TROPIPOC in the last 168 hours.  ? ?BNPNo results for input(s): BNP, PROBNP in the last 168 hours.  ? ?DDimer No results for input(s): DDIMER in the last 168 hours.  ? ?Radiology  ?  ?No results found. ? ?Cardiac Studies  ? ?See heart cath/PCI results ? ?Patient Profile  ?   ?81 y.o. male admitted with NSTEMI/NSVT s/p PCI with repetitive MMVT, NS.  ? ?Assessment & Plan  ?  ?NSVT/PVC's - His arrhythmias are improved on amiodarone. We will continue IV today and switch to oral tomorrow with plans to DC home tomorrow pm if stable.  ?  NSTEMI - he is s/p PCI.  ?PAF - he was on low dose amio and maintaining NSR. He will be discharged on higher dose with anticipated wean down to 200 mg daily. ?Chronic systolic heart failure -he appears euvolemic. We will follow. ? ? ?For questions or updates, please contact [place] ?Please consult [email or web address] for contact info under Cardiology/STEMI. ?  ?   ?Signed, ?Cristopher Peru, MD  ?02/19/2022, 10:20 AM    ?

## 2022-02-19 NOTE — Progress Notes (Signed)
ANTICOAGULATION CONSULT NOTE  ? ?Pharmacy Consult for warfarin ?Indication: atrial fibrillation ? ?Allergies  ?Allergen Reactions  ? Codeine Other (See Comments)  ?  Pt does not remember  ? Iodine Swelling  ? ? ?Patient Measurements: ?Height: '5\' 1"'$  (154.9 cm) ?Weight: 58 kg (127 lb 13.9 oz) ?IBW/kg (Calculated) : 52.3 ?Heparin Dosing Weight: 59.9 kg ? ?Vital Signs: ?Temp: 98.3 ?F (36.8 ?C) (03/18 0406) ?Temp Source: Oral (03/18 0406) ?BP: 110/58 (03/18 0406) ?Pulse Rate: 58 (03/18 0406) ? ?Labs: ?Recent Labs  ?  02/16/22 ?1320 02/16/22 ?2155 02/17/22 ?0328 02/18/22 ?0025 02/18/22 ?0246 02/19/22 ?0335  ?HGB  --   --  11.8* 13.3  --   --   ?HCT  --   --  35.8* 39.4  --   --   ?PLT  --   --  143* 158  --   --   ?LABPROT 21.3*  --  19.8* 17.4*  --  23.2*  ?INR 1.9*  --  1.7* 1.4*  --  2.1*  ?HEPARINUNFRC 0.41 0.40 0.46  --   --   --   ?CREATININE  --   --   --  1.57*  --   --   ?TROPONINIHS  --   --   --  230* 711*  --   ? ? ? ?Estimated Creatinine Clearance: 26.4 mL/min (A) (by C-G formula based on SCr of 1.57 mg/dL (H)). ? ? ?Medical History: ?Past Medical History:  ?Diagnosis Date  ? 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 Aortic stenosis   ? mild with a mean aortic valve gradient of 12 mmHg  ? Atrial fibrillation ([place])   ? holding sinus rhythm on Amiodarone  ? CAD (coronary artery disease)   ? a. S/P Ant MI 1980;  b. 1997 S/P CABG x 8 (LIMA to diag-LAD, SVG to OM1-OM2-OM3, SVG to [facility] - Dr [name]);  c. 01/2015 Cath: 3VD, 8/8 patent grafts.  ? Cardiomyopathy   ? Dysrhythmia   ? Esophageal reflux   ? GERD (gastroesophageal reflux disease)   ? Headache   ? History of colonoscopy   ? History of transesophageal echocardiography (TEE) for monitoring   ? Other and unspecified hyperlipidemia   ? S/P TAVR (transcatheter aortic valve replacement)   ? a. 03/2015 26 mm Edwards Sapien 3 transcatheter heart valve placed via open left transfemoral approach.  ? Skin lesions, generalized   ? facial which may  represent actinic keratoses and possible photosensitivity from Amiodarone  ? Unspecified essential hypertension   ? ? ?Assessment: ?81 yo M presents with chest pain and elevated cardiac troponins. Pharmacy consulted to start warfarin back post cath. Pt takes warfarin '4mg'$  po daily for atrial fibrillation at home. Last dose of warfarin was 02/15/22 ~07:00.  ? ?Underwent cardiac cath 3/16 severe CAD - now successful PTCA/DES to SVG to PDA.  ? ?INR is therapeutic at 2.1. Hgb 13.3, plt 158. No s/sx of bleeding. Will attempt to resume patient's home regimen of 4 mg daily as his INR was therapeutic upon admission. Now that INR is therapeutic, will DC aspirin and continue Plavix.  ? ?Goal of Therapy:  ?INR goal 2-3 ?Monitor platelets by anticoagulation protocol: Yes ?  ?Plan:  ?Warfarin 4 mg x1 ?DC aspirin ?Continue plavix ?Monitor daily INR, CBC, and for s/sx of bleeding  ? ?Joseph Art, Pharm.D. ?PGY-1 Pharmacy Resident ?[phone] ?02/19/2022 7:22 AM ? ?

## 2022-02-20 ENCOUNTER — Encounter (HOSPITAL_COMMUNITY): Payer: Self-pay | Admitting: Cardiology

## 2022-02-20 ENCOUNTER — Other Ambulatory Visit: Payer: Self-pay | Admitting: Cardiology

## 2022-02-20 DIAGNOSIS — Z95820 Peripheral vascular angioplasty status with implants and grafts: Secondary | ICD-10-CM

## 2022-02-20 DIAGNOSIS — I480 Paroxysmal atrial fibrillation: Secondary | ICD-10-CM

## 2022-02-20 DIAGNOSIS — I4819 Other persistent atrial fibrillation: Secondary | ICD-10-CM | POA: Diagnosis present

## 2022-02-20 DIAGNOSIS — T50905D Adverse effect of unspecified drugs, medicaments and biological substances, subsequent encounter: Secondary | ICD-10-CM

## 2022-02-20 HISTORY — DX: Paroxysmal atrial fibrillation: I48.0

## 2022-02-20 HISTORY — DX: Peripheral vascular angioplasty status with implants and grafts: Z95.820

## 2022-02-20 LAB — CBC
HCT: 35 % — ABNORMAL LOW (ref 39.0–52.0)
Hemoglobin: 12.5 g/dL — ABNORMAL LOW (ref 13.0–17.0)
MCH: 34.8 pg — ABNORMAL HIGH (ref 26.0–34.0)
MCHC: 35.7 g/dL (ref 30.0–36.0)
MCV: 97.5 fL (ref 80.0–100.0)
Platelets: 282 10*3/uL (ref 150–400)
RBC: 3.59 MIL/uL — ABNORMAL LOW (ref 4.22–5.81)
RDW: 13.3 % (ref 11.5–15.5)
WBC: 10.8 10*3/uL — ABNORMAL HIGH (ref 4.0–10.5)
nRBC: 0 % (ref 0.0–0.2)

## 2022-02-20 LAB — BASIC METABOLIC PANEL
Anion gap: 9 (ref 5–15)
BUN: 23 mg/dL (ref 8–23)
CO2: 24 mmol/L (ref 22–32)
Calcium: 8.7 mg/dL — ABNORMAL LOW (ref 8.9–10.3)
Chloride: 105 mmol/L (ref 98–111)
Creatinine, Ser: 1.17 mg/dL (ref 0.61–1.24)
GFR, Estimated: >60 mL/min (ref >60)
Glucose, Bld: 110 mg/dL — ABNORMAL HIGH (ref 70–99)
Potassium: 4.1 mmol/L (ref 3.5–5.1)
Sodium: 138 mmol/L (ref 135–145)

## 2022-02-20 LAB — PROTIME-INR
INR: 2.1 — ABNORMAL HIGH (ref 0.8–1.2)
Prothrombin Time: 23.2 seconds — ABNORMAL HIGH (ref 11.4–15.2)

## 2022-02-20 MED ORDER — LOSARTAN POTASSIUM 50 MG PO TABS
50.0000 mg | ORAL_TABLET | Freq: Every day | ORAL | Status: DC
  Administered 2022-02-20: 50 mg via ORAL
  Filled 2022-02-20: qty 1

## 2022-02-20 MED ORDER — WARFARIN SODIUM 2 MG PO TABS: 4.0000 mg | ORAL_TABLET | Freq: Once | ORAL | Status: DC

## 2022-02-20 MED ORDER — AMIODARONE HCL 200 MG PO TABS: 400.0000 mg | ORAL_TABLET | Freq: Two times a day (BID) | ORAL | 3 refills | Status: DC

## 2022-02-20 MED ORDER — METOPROLOL TARTRATE 25 MG PO TABS: 25.0000 mg | ORAL_TABLET | Freq: Two times a day (BID) | ORAL | 6 refills | Status: DC

## 2022-02-20 MED ORDER — AMIODARONE HCL 200 MG PO TABS
400.0000 mg | ORAL_TABLET | Freq: Every day | ORAL | Status: DC
  Administered 2022-02-20: 400 mg via ORAL
  Filled 2022-02-20: qty 2

## 2022-02-20 MED ORDER — ATORVASTATIN CALCIUM 80 MG PO TABS: 80.0000 mg | ORAL_TABLET | Freq: Every day | ORAL | 6 refills | Status: DC

## 2022-02-20 MED ORDER — AMLODIPINE BESYLATE 10 MG PO TABS: 10.0000 mg | ORAL_TABLET | Freq: Every day | ORAL | 6 refills | Status: DC

## 2022-02-20 MED ORDER — CLOPIDOGREL BISULFATE 75 MG PO TABS: 75.0000 mg | ORAL_TABLET | Freq: Every day | ORAL | 11 refills | Status: DC

## 2022-02-20 MED ORDER — ASPIRIN EC 81 MG PO TBEC
81.0000 mg | DELAYED_RELEASE_TABLET | Freq: Every day | ORAL | Status: DC
  Administered 2022-02-20: 81 mg via ORAL
  Filled 2022-02-20: qty 1

## 2022-02-20 MED ORDER — NITROGLYCERIN 0.4 MG SL SUBL: 0.4000 mg | SUBLINGUAL_TABLET | SUBLINGUAL | 4 refills | Status: AC | PRN

## 2022-02-20 MED ORDER — LOSARTAN POTASSIUM 50 MG PO TABS: 50.0000 mg | ORAL_TABLET | Freq: Every day | ORAL | 6 refills | Status: DC

## 2022-02-20 NOTE — Discharge Instructions (Addendum)
Call [facility] at [phone] if any bleeding, swelling or drainage at cath site.  May shower, no tub baths for 48 hours for groin sticks. No lifting over 5 pounds for 5 days.  No Driving for 5 days if you drive. ? ?Continue coumadin, plavix and asprin 81 mg for 30 days and stop asprin on 03/20/22 but continue coumadin and plavix.  This is to keep stent open.  ? ?Amiodarone take 2 of 200 mg tabs( total 400 mg twice a day) twice a day for 1 week then decrease to 2 tabs daily only.  We may decrease dose in future.   ? ?Heart Healthy diet  ? ?Call the office if any problems or questions. ? ?Prefer someone stay with you first couple of days to see how you do.   ? ?Have LAB work done Thursday 02/24/22 at Dr. Antionette Char office.    Same day of coumadin check at your usual place. ? ?If you have not heard from office by Wed. Call the office. ? ?Have coumadin check on Thursday call for appt ? ?

## 2022-02-20 NOTE — Discharge Summary (Addendum)
?Discharge Summary  ?  ?Patient ID: Jesus [name] ?MRN: [id number]; DOB: 25-Jan-1941 ? ?Admit date: 02/15/2022 ?Discharge date: 02/20/2022 ? ?PCP:  Jesus Post, MD ?  ?[facility] Providers ?Cardiologist:  Jesus Mocha, MD      ? ? ?Discharge Diagnoses  ?  ?Principal Problem: ?  NSTEMI (non-ST elevated myocardial infarction) ([place]) ?Active Problems: ?  CAD, AUTOLOGOUS BYPASS GRAFT ?  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  Aortic valve disorder ?  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  Chronic anticoagulation-Couamdin ?  Nonsustained ventricular tachycardia ?  Paroxysmal atrial fibrillation (HCC) ?  S/P angioplasty with stent 02/17/22 DES to proximal VG to PDA ? ? ? ?Diagnostic Studies/Procedures  ?  ?02/17/22  cardiac cath PCI  ?  Ost RCA to Prox RCA lesion is 100% stenosed. ?  Prox Graft lesion before RV Branch  is 80% stenosed. ?  RPAV lesion is 100% stenosed. ?  Ost LM to Mid LM lesion is 100% stenosed. ?  3rd Mrg lesion is 80% stenosed. ?  A drug-eluting stent was successfully placed using a STENT ONYX FRONTIER 3.5X12. ?  [name] intervention, there is a 10% residual stenosis. ?  SVG graft was visualized by angiography. ?  SVG graft was visualized by angiography. ?  LIMA graft was visualized by angiography. ?  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 There is a severe stenosis in the proximal body of the SVG to the PDA.  ?Successful PTCA/DES x 1 proximal body of SVG to PDA using a distal protection device. ?  ?Recommendations: Continue ASA/Plavix until he is therapeutic on coumadin and can then consider stopping ASA while continuing Plavix with coumadin.  ?  ?  Diagnostic ?Dominance: Right ?Intervention ? ? ?_____________ ?  ? ?Echo 02/16/22 ?IMPRESSIONS  ? ? ? 1. Left ventricular ejection fraction, by estimation, is 35 to 40%. The  ?left ventricle has moderately decreased function. The left ventricle  ?demonstrates regional wall motion abnormalities (see scoring  ?diagram/findings for description).  ?Inferior/inferolateral akinesis. Left ventricular diastolic parameters are  ?consistent with Grade II diastolic dysfunction (pseudonormalization).  ?Elevated left atrial pressure.  ? 2. Right ventricular systolic function is normal. The right ventricular  ?size is mildly enlarged.  ? 3. Left atrial size was severely dilated.  ? 4. The mitral valve is normal in structure. Trivial mitral valve  ?regurgitation. No evidence of mitral stenosis.  ? 5. The aortic valve has been repaired/replaced. Aortic valve  ?regurgitation is mild. There is a 26 mm Edwards Sapien prosthetic (TAVR)  ?valve present in the aortic position. Procedure Date: 03/10/15. Echo  ?findings are consistent with perivalvular leak of  ?the aortic prosthesis. Vmax 2.9 m/s, MG 59mHg (unchanged from prior  ?echo), EOA 1.4 cm^2, DI 0.46  ? 6. The inferior vena cava is normal in size with greater than 50%  ?respiratory variability, suggesting right atrial pressure of 3 mmHg.  ? ?FINDINGS  ? Left Ventricle: Left ventricular ejection fraction, by estimation, is 35  ?to 40%. The left ventricle has moderately decreased function. The left  ?ventricle demonstrates regional wall motion abnormalities. The left  ?ventricular internal  cavity size was  ?normal in size. There is no left ventricular hypertrophy. Left ventricular  ?diastolic parameters are consistent with Grade II diastolic dysfunction  ?(pseudonormalization). Elevated left atrial pressure.  ? ?   ?LV Wall Scoring:  ?The entire inferior wall and posterior wall are akinetic. The entire  ?anterior  ?wall, antero-lateral wall, entire septum, apical lateral segment, and  apex  ?are normal.  ? ?Right Ventricle: The right ventricular size is mildly enlarged. No  ?increase in right ventricular wall thickness. Right ventricular systolic  ?function is normal. The tricuspid regurgitant velocity is 1.58 m/s, and  ?with an assumed right atrial pressure of 3  ? mmHg, the estimated right ventricular systolic pressure is 14.4 mmHg.  ? ?Left Atrium: Left atrial size was severely dilated.  ? ?Right Atrium: Right atrial size was normal in size.  ? ?Pericardium: There is no evidence of pericardial effusion.  ? ?Mitral Valve: The mitral valve is normal in structure. Trivial mitral  ?valve regurgitation. No evidence of mitral valve stenosis.  ? ?Tricuspid Valve: The tricuspid valve is normal in structure. Tricuspid  ?valve regurgitation is trivial.  ? ?Aortic Valve: The aortic valve has been repaired/replaced. Aortic valve  ?regurgitation is mild. Aortic valve mean gradient measures 16.3 mmHg.  ?Aortic valve peak gradient measures 34.3 mmHg. Aortic valve area, by VTI  ?measures 1.46 cm?[name] There is a 26 mm  ?Edwards Sapien prosthetic, stented (TAVR) valve present in the aortic  ?position. Procedure Date: 03/10/15.  ? ?Pulmonic Valve: The pulmonic valve was not well visualized. Pulmonic valve  ?regurgitation is not visualized.  ? ?Aorta: The aortic root and ascending aorta are structurally normal, with  ?no evidence of dilitation.  ? ?Venous: The inferior vena cava is normal in size with greater than 50%  ?respiratory variability, suggesting right atrial pressure of 3 mmHg.  ? ?IAS/Shunts: The interatrial septum was not well visualized.  ? ?History of Present Illness   ?  ?Jesus [name] is a 81 y.o. male with history of CAD s/p CABG in 1997, severe aortic stenosis s/p TAVR with perivalvular leak 03/2015, paroxysmal atrial fibrillation on warfarin and amiodarone, hyperlipidemia, hypertension last cath prior to admit in 2016 with Patent LIMA to LAD, SVG to OM branch and SVG to RCA branch. Severe stenosis  of the distal OM 3 branch after the insertion of the saphenous vein graft.  Echo 05/2021 and EF 45-50% no regional wall motion abnormality, grade 1 diastolic dysfunction.  Normal functioning valve with perivalvular leak.  Mean gradient 15 mmHg.  ? ?He was admitted 02/15/22 with increased fatigue and chest pain with minimal activity.  He presented to ER when too difficult to work.  HS troponin went from 700 to 1301.  INR was 2.0 HGB and K+ stable CXR stable.   RXV:QMGQQP sinus rhythm, T wave inversion in lead III, aVF and V6 (similar to EKG in November 2020) he was admitted with plan for cardiac cath, holding coumadin and adding IV heparin.  He was maintaining SR on admit. ?  ? ?Hospital Course  ?   ?Consultants: EP Dr. Caryl Comes ? ? Pt's hs troponin increased to 1757.  He was able to undergo cath 02/17/22 with PCI as above.   Echo with EF 35-40% with LV RWMA, G2DD.  LA severely dilated.  Aortic valve  regurgitation is mild. There is a 26 mm Edwards Sapien prosthetic (TAVR)  ?valve present in the aortic position. Procedure Date: 03/10/15. Echo findings are consistent with perivalvular leak of the  aortic prosthesis. Vmax 2.9 m/s, MG 10mHg (unchanged from prior echo), EOA 1.4 cm^2, DI 0.46  ? ?Pt developed NSVT on 02/18/22 given IV amiodarone and BB added.  Electrolytes stable. He was seen by EP Dr. KCaryl Comes  His amiodarone was increased and pt continued to be monitored.  Plan for  oral amiodarone 400 twice daily to take for 1 week then 400 mg daily until seen by Dr. KCaryl Comes   PO dose was started on 02/20/22.   In the event that they do not settle down with amiodarone, Dr. KCaryl Comeswould use a 1B agent, while in hospital could use lidocaine as 1 of its effects to hyper polarize rest and myocardium but pt improved with amiodarone. ? ?Plan for triple anticoagulant with warfarin, asa and plavix and once INR is therapeutic would stop ASA in a few weeks.   Today INR is 2.1 - will resume home regimen 4 mg daily.  Will need INR check next  [place] IN[place].    ? 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   ? ?AKI with [name] cath Cr 1.57 checked today with improvement will resume cozaar at lower do

## 2022-02-20 NOTE — TOC Transition Note (Signed)
Transition of Care (TOC) - CM/SW Discharge Note ? ? ?Patient Details  ?Name: Jesus [name] ?MRN: [id number] ?Date of Birth: Jun 09, 1941 ? ?Transition of Care (TOC) CM/SW Contact:  ?Pollie Friar, RN ?Phone Number: ?02/20/2022, 2:14 PM ? ? ?Clinical Narrative:    ?Patient is discharging home with self care. No needs per TOC. ? ? ?Final next level of care: Home/Self Care ?Barriers to Discharge: No Barriers Identified ? ? ?Patient Goals and CMS Choice ?  ?  ?  ? ?Discharge Placement ?  ?           ?  ?  ?  ?  ? ?Discharge Plan and Services ?  ?  ?           ?  ?  ?  ?  ?  ?  ?  ?  ?  ?  ? ?Social Determinants of Health (SDOH) Interventions ?  ? ? ?Readmission Risk Interventions ?No flowsheet data found. ? ? ? ? ?

## 2022-02-20 NOTE — Progress Notes (Signed)
ANTICOAGULATION CONSULT NOTE  ? ?Pharmacy Consult for warfarin ?Indication: atrial fibrillation ? ?Allergies  ?Allergen Reactions  ? Codeine Other (See Comments)  ?  Pt does not remember  ? Iodine Swelling  ? ? ?Patient Measurements: ?Height: '5\' 1"'$  (154.9 cm) ?Weight: 58 kg (127 lb 13.9 oz) ?IBW/kg (Calculated) : 52.3 ?Heparin Dosing Weight: 59.9 kg ? ?Vital Signs: ?  ? ?Labs: ?Recent Labs  ?  02/18/22 ?0025 02/18/22 ?0246 02/19/22 ?1275 02/20/22 ?0210  ?HGB 13.3  --   --   --   ?HCT 39.4  --   --   --   ?PLT 158  --   --   --   ?LABPROT 17.4*  --  23.2* 23.2*  ?INR 1.4*  --  2.1* 2.1*  ?CREATININE 1.57*  --   --   --   ?TROPONINIHS 230* 711*  --   --   ? ? ? ?Estimated Creatinine Clearance: 26.4 mL/min (A) (by C-G formula based on SCr of 1.57 mg/dL (H)). ? ? ?Medical History: ?Past Medical History:  ?Diagnosis Date  ? Acute myocardial infarction of inferior wall ([place]) 1980  ? Aortic stenosis   ? mild with a mean aortic valve gradient of 12 mmHg  ? Atrial fibrillation ([place])   ? holding sinus rhythm on Amiodarone  ? CAD (coronary artery disease)   ? a. S/P Ant MI 1980;  b. 1997 S/P CABG x 8 (LIMA to diag-LAD, SVG to OM1-OM2-OM3, SVG to [facility] - Dr [name]);  c. 01/2015 Cath: 3VD, 8/8 patent grafts.  ? Cardiomyopathy   ? Dysrhythmia   ? Esophageal reflux   ? GERD (gastroesophageal reflux disease)   ? Headache   ? History of colonoscopy   ? History of transesophageal echocardiography (TEE) for monitoring   ? Other and unspecified hyperlipidemia   ? S/P TAVR (transcatheter aortic valve replacement)   ? a. 03/2015 26 mm Edwards Sapien 3 transcatheter heart valve placed via open left transfemoral approach.  ? Skin lesions, generalized   ? facial which may represent actinic keratoses and possible photosensitivity from Amiodarone  ? Unspecified essential hypertension   ? ? ?Assessment: ?81 yo M presents with chest pain and elevated cardiac troponins. Pharmacy consulted to start warfarin back post cath. Pt takes warfarin  '4mg'$  po daily for atrial fibrillation at home. Last dose of warfarin was 02/15/22 ~07:00.  ? ?Underwent cardiac cath 3/16 severe CAD - now successful PTCA/DES to SVG to PDA.  ? ?INR is therapeutic at 2.1. Hgb 13.3, plt 158. No s/sx of bleeding. Will attempt to resume patient's home regimen of 4 mg daily as his INR was therapeutic upon admission. Per anticoagulation visit on 02/04/22, patient time in therapeutic range is 78.6% over 10.9 years. Patient is eating 100% of meals. Can consider increasing dose if INR trends downward.  ? ?Goal of Therapy:  ?INR goal 2-3 ?Monitor platelets by anticoagulation protocol: Yes ?  ?Plan:  ?Warfarin 4 mg x1 ?Monitor daily INR, CBC, and for s/sx of bleeding  ? ?Joseph Art, Pharm.D. ?PGY-1 Pharmacy Resident ?[id number] ?02/20/2022 7:39 AM ? ?

## 2022-02-21 ENCOUNTER — Telehealth: Payer: Self-pay | Admitting: Cardiovascular Disease

## 2022-02-21 ENCOUNTER — Telehealth: Payer: Self-pay

## 2022-02-21 NOTE — Telephone Encounter (Signed)
Transition Care Management Unsuccessful Follow-up Telephone Call ? ?Date of discharge and from where:  02/20/2022 [name] ? ?Attempts:  1st Attempt ? ?Reason for unsuccessful TCM follow-up call:  Left voice message ? ? ? ?

## 2022-02-21 NOTE — Telephone Encounter (Signed)
02/21/22 8:52am LVM to schedule lab work for this Thursday (02/24/22) and then a TOC in the next 1-2 weeks with an APP - LCN ?

## 2022-02-21 NOTE — Telephone Encounter (Signed)
Pt was admitted to hospital after going to the ER for chest pain on 3/14. Pt needs PCP hospital f/u and INR check. Pt can get INR checked at PCP apt if he prefers.  ? ?LVM  ?

## 2022-02-21 NOTE — Telephone Encounter (Signed)
TOC call to patient no answer.Left message to call back.Message sent to Dr.Cooper's office. ?

## 2022-02-22 ENCOUNTER — Telehealth (HOSPITAL_COMMUNITY): Payer: Self-pay

## 2022-02-22 NOTE — Telephone Encounter (Signed)
Per phase I cardiac rehab, fax cardiac rehab referral to [facility] cardiac rehab. ?

## 2022-02-22 NOTE — Telephone Encounter (Signed)
**Note De-Identified Jesus [name] Obfuscation** Transition Care Management Unsuccessful Follow-up Telephone Call ? ?Date of discharge and from where:  02/20/2022 from [facility] ? ?Attempts:  2nd Attempt ? ?Reason for unsuccessful TCM follow-up call:  Left voice message on the pts home phone. ?I also called his cell phone but got no answer and no way to leave a VM. ? ?  ?

## 2022-02-22 NOTE — Telephone Encounter (Signed)
Pt has apt with PCP tomorrow. Added pt to coumadin clinic schedule for after PCP apt.  ?

## 2022-02-23 ENCOUNTER — Ambulatory Visit (INDEPENDENT_AMBULATORY_CARE_PROVIDER_SITE_OTHER): Payer: Medicare Other | Admitting: Family Medicine

## 2022-02-23 ENCOUNTER — Encounter: Payer: Self-pay | Admitting: Family Medicine

## 2022-02-23 ENCOUNTER — Ambulatory Visit (INDEPENDENT_AMBULATORY_CARE_PROVIDER_SITE_OTHER): Payer: Medicare Other

## 2022-02-23 VITALS — BP 126/60 | HR 60 | Temp 98.0°F | Ht 61.0 in | Wt 132.2 lb

## 2022-02-23 DIAGNOSIS — N179 Acute kidney failure, unspecified: Secondary | ICD-10-CM

## 2022-02-23 DIAGNOSIS — Z7901 Long term (current) use of anticoagulants: Secondary | ICD-10-CM

## 2022-02-23 DIAGNOSIS — I255 Ischemic cardiomyopathy: Secondary | ICD-10-CM | POA: Diagnosis not present

## 2022-02-23 DIAGNOSIS — I214 Non-ST elevation (NSTEMI) myocardial infarction: Secondary | ICD-10-CM

## 2022-02-23 LAB — POCT INR: INR: 2.6 (ref 2.0–3.0)

## 2022-02-23 NOTE — Progress Notes (Signed)
Continue to take 1 (4 mg) tablet daily.  Re-check in 6 weeks. ?

## 2022-02-23 NOTE — Patient Instructions (Addendum)
Pre visit review using our clinic review tool, if applicable. No additional management support is needed unless otherwise documented below in the visit note. ? ?Continue to take 1 (4 mg) tablet daily.  Re-check in 6 weeks. ?

## 2022-02-23 NOTE — Telephone Encounter (Signed)
**Note De-Identified Jesus [name] Obfuscation** Transition Care Management Unsuccessful Follow-up Telephone Call ? ?Date of discharge and from where:  02/20/2022 from [facility] ? ?Attempts:  3rd Attempt ? ?Reason for unsuccessful TCM follow-up call:   Left voice message on the pts home phone. ?I also called his cell phone but got no answer and no way to leave a VM. ? ?I did leave a reminder in VM advising the pt that he has a post hospital f/u scheduled with Robbie Lis, PA-c on 4/20 at 11:20 am at [facility] at [facility] [address] in [place]. ?  ?

## 2022-02-23 NOTE — Progress Notes (Signed)
? ?Established Patient Office Visit ? ?Subjective:  ?Patient ID: Jesus [name], male    DOB: 24-Aug-1941  Age: 81 y.o. MRN: [id number] ? ?CC:  ?Chief Complaint  ?Patient presents with  ? Hospitalization Follow-up  ? ? ?HPI ?Jesus [name] presents for hospital follow-up.  He had recent admission 3/14 through the 19th for NSTEMI.  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  Presented with unstable angina. ? ?On the day of admission he got up and went to the gym.  He was only on the treadmill for about 5 minutes but states he felt very "tired ".  He tried to go to work but continued to have symptoms and felt very poorly and went to local urgent care.  Had EKG and ended up being referred to the ER for further evaluation.  He noted dyspnea and apparently had some mild chest pain with minimal activity.  Troponin went from [phone].  Chest x-ray unremarkable.  EKG shows some T wave inversion lead III, aVF and V6.  Echocardiogram revealed EF 35 to 40%.  He developed some nonsustained V. tach on 02-18-2022 it was given IV amiodarone and beta-blocker added.  Was discharged on amiodarone 400 mg twice daily for 1 week then 400 mg daily.  Plan was for triple anticoagulation with warfarin, aspirin, and Plavix and once INR therapeutic stop aspirin. ? ?He did have acute kidney injury with post-cath creatinine 1.57 but improved prior to discharge.  Plan was to resume losartan at lower dose and check basic metabolic panel in 1 week.  He actually has scheduled labs tomorrow for basic metabolic panel at cardiology but would like to go and get this today to a safe travel all the way from [place] here ? ?Generally feels well at this time.  No chest pains.  No dyspnea.  Has not resumed driving yet.  Is currently staying with sister-in-law.  Cardiology looking at arranging for [facility]. ? ?02/17/22  cardiac cath PCI   ?  Ost RCA to Prox RCA lesion is 100% stenosed. ?  Prox Graft lesion before RV Branch  is 80% stenosed. ?  RPAV lesion is 100% stenosed. ?  Ost LM to Mid LM lesion is 100% stenosed. ?  3rd Mrg lesion is 80% stenosed. ?  A drug-eluting stent was successfully placed using a STENT ONYX FRONTIER 3.5X12. ?  Post intervention, there is a 10% residual stenosis. ?  SVG graft was visualized by angiography. ?  SVG graft was visualized by angiography. ?  LIMA graft was visualized by angiography. ?  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 There is a severe stenosis in the proximal body of the SVG to the PDA.  ?Successful PTCA/DES x 1 proximal body of SVG to PDA using a distal protection device. ?  ?Recommendations: Continue ASA/Plavix until he is therapeutic on coumadin and can then consider stopping ASA while continuing Plavix with coumadin.  ? ?Past Medical History:  ?Diagnosis Date  ? 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 Aortic stenosis   ?  mild with a mean aortic valve gradient of 12 mmHg  ? Atrial fibrillation ([place])   ? holding sinus rhythm on Amiodarone  ? CAD (coronary artery disease)   ? a. S/P Ant MI 1980;  b. 1997 S/P CABG x 8 (LIMA to diag-LAD, SVG to OM1-OM2-OM3, SVG to [facility] - Dr [name]);  c. 01/2015 Cath: 3VD, 8/8 patent grafts.  ? Cardiomyopathy   ? Dysrhythmia   ? Esophageal reflux   ? GERD (gastroesophageal reflux disease)   ? Headache   ? History of colonoscopy   ? History of transesophageal echocardiography (TEE) for monitoring   ? Other and unspecified hyperlipidemia   ? Paroxysmal atrial fibrillation ([place]) 02/20/2022  ? S/P angioplasty with stent 02/17/22 DES to  proximal VG to PDA 02/20/2022  ? S/P TAVR (transcatheter aortic valve replacement)   ? a. 03/2015 26 mm Edwards Sapien 3 transcatheter heart valve placed via open left transfemoral approach.  ? Skin lesions, generalized   ? facial which [name] represent actinic keratoses and possible photosensitivity from Amiodarone  ? Unspecified essential hypertension   ? ? ?Past Surgical History:  ?Procedure Laterality Date  ? CARDIAC CATHETERIZATION    ? CARDIOVERSION  11/18/2006  ? Dr. [name]  ? CATARACT EXTRACTION W/ INTRAOCULAR LENS IMPLANT  April '13  (Dr. [name])  ? left eye only  ? CHOLECYSTECTOMY    ? CORONARY ARTERY BYPASS GRAFT  02/09/1996  ? LIMA to diag-LAD, SVG to OM1-OM2-OM3, SVG to [facility]  ? CORONARY STENT INTERVENTION N/A 02/17/2022  ? Procedure: CORONARY STENT INTERVENTION;  Surgeon: Burnell Blanks, MD;  Location: [facility];  Service: Cardiovascular;  Laterality: N/A;  ? CORONARY/GRAFT ANGIOGRAPHY N/A 02/17/2022  ? Procedure: CORONARY/GRAFT ANGIOGRAPHY;  Surgeon: Burnell Blanks, MD;  Location: [facility];  Service: Cardiovascular;  Laterality: N/A;  ? EYE SURGERY    ? LEFT AND RIGHT HEART CATHETERIZATION WITH CORONARY/GRAFT ANGIOGRAM N/A 01/26/2015  ? Procedure: LEFT AND RIGHT HEART CATHETERIZATION WITH [name];  Surgeon: [name], MD;  Location: [facility] CATH LAB;  Service: Cardiovascular;  Laterality: N/A;  ? TEE WITHOUT CARDIOVERSION N/A 03/10/2015  ? Procedure: TRANSESOPHAGEAL ECHOCARDIOGRAM (TEE);  Surgeon: [name], MD;  Location: [facility];  Service: Open Heart Surgery;  Laterality: N/A;  ? TONSILLECTOMY    ? TRANSCATHETER AORTIC VALVE REPLACEMENT, TRANSFEMORAL N/A 03/10/2015  ? Procedure: TRANSCATHETER AORTIC VALVE REPLACEMENT, TRANSFEMORAL;  Surgeon: [name], MD;  Location: [facility];  Service: Open Heart Surgery;  Laterality: N/A;  ? ? ?Family History  ?Problem Relation Age of Onset  ? Stroke Mother 83  ?     Deceased  ? Crohn's disease Mother   ?      Deceased  ? Heart attack Father 58  ?     Deceased  ? Heart disease Father   ? Heart failure Father   ?     Deceased  ? Atrial fibrillation Brother   ? Heart disease Paternal Uncle   ? Prostate cancer Paternal Uncle   ? Colon cancer Neg Hx   ? ? ?Social History  ? ?Socioeconomic History  ? Marital status: Married  ?  Spouse name: Not on file  ? Number of children: Not on file  ? Years of education: Not on file  ? Highest education level: Not on file  ?Occupational History  ? Not on file  ?Tobacco Use  ? Smoking status: Never  ? Smokeless tobacco: Never  ? Tobacco comments:  ?  Does not  smoke.  ?Vaping Use  ? Vaping Use: Never used  ?Substance and Sexual Activity  ? Alcohol use: No  ?  Alcohol/week: 0.0 standard drinks  ? Drug use: No  ? Sexual activity: Not Currently  ?Other Topics Concern  ? Not on file  ?Social History Narrative  ? HSG. [employer] 6 years. Married - '69 - 1 year/divorced. '76 - . No children. Work - mfg/textiles - Designer, multimedia; currently works doing maintenance. [employer] - they have discussed this. Provided packet august '13.   ?   ?   ?   ?   ? ?Social Determinants of Health  ? ?Financial Resource Strain: Low Risk   ? Difficulty of Paying Living Expenses: Not hard at all  ?Food Insecurity: No Food Insecurity  ? Worried About Charity fundraiser in the Last Year: Never true  ? Ran Out of Food in the Last Year: Never true  ?Transportation Needs: No Transportation Needs  ? Lack of Transportation (Medical): No  ? Lack of Transportation (Non-Medical): No  ?Physical Activity: Sufficiently Active  ? Days of Exercise per Week: 5 days  ? Minutes of Exercise per Session: 60 min  ?Stress: No Stress Concern Present  ? Feeling of Stress : Not at all  ?Social Connections: Moderately Integrated  ? Frequency of Communication with Friends and Family: Three times a week  ? Frequency of Social Gatherings with Friends and Family: Three times a week  ? Attends Religious Services: More than 4 times per  year  ? Active Member of Clubs or Organizations: Yes  ? Attends Archivist Meetings: More than 4 times per year  ? Marital Status: Widowed  ?Intimate Partner Violence: Not At Risk  ? Fear of Cu

## 2022-02-24 ENCOUNTER — Emergency Department (HOSPITAL_COMMUNITY): Payer: Medicare Other

## 2022-02-24 ENCOUNTER — Encounter (HOSPITAL_COMMUNITY): Payer: Self-pay | Admitting: Emergency Medicine

## 2022-02-24 ENCOUNTER — Other Ambulatory Visit: Payer: Self-pay

## 2022-02-24 ENCOUNTER — Emergency Department (HOSPITAL_COMMUNITY)
Admission: EM | Admit: 2022-02-24 | Discharge: 2022-02-24 | Disposition: A | Discharge status: Home / Self Care | Payer: Medicare Other | Source: Home / Self Care | Attending: Emergency Medicine | Admitting: Emergency Medicine

## 2022-02-24 ENCOUNTER — Other Ambulatory Visit: Payer: Medicare Other

## 2022-02-24 DIAGNOSIS — Z7982 Long term (current) use of aspirin: Secondary | ICD-10-CM | POA: Diagnosis not present

## 2022-02-24 DIAGNOSIS — M79672 Pain in left foot: Secondary | ICD-10-CM | POA: Insufficient documentation

## 2022-02-24 DIAGNOSIS — R778 Other specified abnormalities of plasma proteins: Secondary | ICD-10-CM | POA: Diagnosis not present

## 2022-02-24 DIAGNOSIS — I509 Heart failure, unspecified: Secondary | ICD-10-CM | POA: Diagnosis not present

## 2022-02-24 DIAGNOSIS — E782 Mixed hyperlipidemia: Secondary | ICD-10-CM | POA: Diagnosis not present

## 2022-02-24 DIAGNOSIS — Z66 Do not resuscitate: Secondary | ICD-10-CM | POA: Diagnosis not present

## 2022-02-24 DIAGNOSIS — I25810 Atherosclerosis of coronary artery bypass graft(s) without angina pectoris: Secondary | ICD-10-CM | POA: Diagnosis not present

## 2022-02-24 DIAGNOSIS — Z79899 Other long term (current) drug therapy: Secondary | ICD-10-CM | POA: Insufficient documentation

## 2022-02-24 DIAGNOSIS — N179 Acute kidney failure, unspecified: Secondary | ICD-10-CM | POA: Diagnosis not present

## 2022-02-24 DIAGNOSIS — I517 Cardiomegaly: Secondary | ICD-10-CM | POA: Diagnosis not present

## 2022-02-24 DIAGNOSIS — I255 Ischemic cardiomyopathy: Secondary | ICD-10-CM | POA: Diagnosis not present

## 2022-02-24 DIAGNOSIS — Z20822 Contact with and (suspected) exposure to covid-19: Secondary | ICD-10-CM | POA: Diagnosis not present

## 2022-02-24 DIAGNOSIS — D649 Anemia, unspecified: Secondary | ICD-10-CM | POA: Diagnosis not present

## 2022-02-24 DIAGNOSIS — R0789 Other chest pain: Secondary | ICD-10-CM | POA: Diagnosis not present

## 2022-02-24 DIAGNOSIS — K219 Gastro-esophageal reflux disease without esophagitis: Secondary | ICD-10-CM | POA: Diagnosis not present

## 2022-02-24 DIAGNOSIS — I130 Hypertensive heart and chronic kidney disease with heart failure and stage 1 through stage 4 chronic kidney disease, or unspecified chronic kidney disease: Secondary | ICD-10-CM | POA: Diagnosis not present

## 2022-02-24 DIAGNOSIS — M79605 Pain in left leg: Secondary | ICD-10-CM | POA: Diagnosis not present

## 2022-02-24 DIAGNOSIS — I480 Paroxysmal atrial fibrillation: Secondary | ICD-10-CM | POA: Diagnosis not present

## 2022-02-24 DIAGNOSIS — M79604 Pain in right leg: Secondary | ICD-10-CM | POA: Diagnosis not present

## 2022-02-24 DIAGNOSIS — I252 Old myocardial infarction: Secondary | ICD-10-CM | POA: Diagnosis not present

## 2022-02-24 DIAGNOSIS — M7989 Other specified soft tissue disorders: Secondary | ICD-10-CM | POA: Insufficient documentation

## 2022-02-24 DIAGNOSIS — Z7901 Long term (current) use of anticoagulants: Secondary | ICD-10-CM | POA: Insufficient documentation

## 2022-02-24 DIAGNOSIS — I2510 Atherosclerotic heart disease of native coronary artery without angina pectoris: Secondary | ICD-10-CM | POA: Diagnosis not present

## 2022-02-24 DIAGNOSIS — I501 Left ventricular failure: Secondary | ICD-10-CM | POA: Diagnosis not present

## 2022-02-24 DIAGNOSIS — J9601 Acute respiratory failure with hypoxia: Secondary | ICD-10-CM | POA: Diagnosis not present

## 2022-02-24 DIAGNOSIS — I110 Hypertensive heart disease with heart failure: Secondary | ICD-10-CM | POA: Diagnosis not present

## 2022-02-24 DIAGNOSIS — N182 Chronic kidney disease, stage 2 (mild): Secondary | ICD-10-CM | POA: Diagnosis not present

## 2022-02-24 DIAGNOSIS — D508 Other iron deficiency anemias: Secondary | ICD-10-CM | POA: Diagnosis not present

## 2022-02-24 DIAGNOSIS — Z953 Presence of xenogenic heart valve: Secondary | ICD-10-CM | POA: Diagnosis not present

## 2022-02-24 DIAGNOSIS — I951 Orthostatic hypotension: Secondary | ICD-10-CM | POA: Diagnosis not present

## 2022-02-24 DIAGNOSIS — D539 Nutritional anemia, unspecified: Secondary | ICD-10-CM | POA: Diagnosis not present

## 2022-02-24 DIAGNOSIS — I5023 Acute on chronic systolic (congestive) heart failure: Secondary | ICD-10-CM | POA: Diagnosis not present

## 2022-02-24 DIAGNOSIS — R0602 Shortness of breath: Secondary | ICD-10-CM | POA: Diagnosis not present

## 2022-02-24 DIAGNOSIS — N189 Chronic kidney disease, unspecified: Secondary | ICD-10-CM | POA: Diagnosis not present

## 2022-02-24 DIAGNOSIS — I214 Non-ST elevation (NSTEMI) myocardial infarction: Secondary | ICD-10-CM | POA: Diagnosis not present

## 2022-02-24 DIAGNOSIS — D509 Iron deficiency anemia, unspecified: Secondary | ICD-10-CM | POA: Diagnosis not present

## 2022-02-24 DIAGNOSIS — D72829 Elevated white blood cell count, unspecified: Secondary | ICD-10-CM | POA: Diagnosis not present

## 2022-02-24 DIAGNOSIS — I4891 Unspecified atrial fibrillation: Secondary | ICD-10-CM | POA: Insufficient documentation

## 2022-02-24 DIAGNOSIS — D631 Anemia in chronic kidney disease: Secondary | ICD-10-CM | POA: Diagnosis not present

## 2022-02-24 DIAGNOSIS — I4729 Other ventricular tachycardia: Secondary | ICD-10-CM | POA: Diagnosis not present

## 2022-02-24 DIAGNOSIS — Z955 Presence of coronary angioplasty implant and graft: Secondary | ICD-10-CM | POA: Diagnosis not present

## 2022-02-24 DIAGNOSIS — I10 Essential (primary) hypertension: Secondary | ICD-10-CM | POA: Insufficient documentation

## 2022-02-24 DIAGNOSIS — I472 Ventricular tachycardia, unspecified: Secondary | ICD-10-CM | POA: Diagnosis not present

## 2022-02-24 LAB — BASIC METABOLIC PANEL
BUN: 35 mg/dL — ABNORMAL HIGH (ref 6–23)
CO2: 24 mEq/L (ref 19–32)
Calcium: 8.5 mg/dL (ref 8.4–10.5)
Chloride: 107 mEq/L (ref 96–112)
Creatinine, Ser: 1.39 mg/dL (ref 0.40–1.50)
GFR: 47.03 mL/min — ABNORMAL LOW (ref >60.00)
Glucose, Bld: 102 mg/dL — ABNORMAL HIGH (ref 70–99)
Potassium: 4.1 mEq/L (ref 3.5–5.1)
Sodium: 139 mEq/L (ref 135–145)

## 2022-02-24 MED ORDER — DICLOFENAC SODIUM 1 % EX GEL
4.0000 g | Freq: Four times a day (QID) | CUTANEOUS | 0 refills | Status: DC
Start: 2022-02-24 — End: 2023-07-07

## 2022-02-24 NOTE — ED Triage Notes (Signed)
Patient c/o left leg swelling onset of this afternoon with inner foot pain. Patient takes plavix. Denies any chest pain or shortness of breath.  ?

## 2022-02-24 NOTE — ED Provider Triage Note (Signed)
Emergency Medicine Provider Triage Evaluation Note ? ?Jesus [name] , a 81 y.o. male  was evaluated in triage.  Pt complains of gradual onset, constant, achy, left foot pain along arch that began earlier today.  Patient states he thinks he [name] have put socks on that are too tight. He states his daughter in law made him come in today. He is currently on Coumadin and Plavix. INR check yesterday at 2.6. Recent cardiac cath to R groin; no right leg pain. ? ?Review of Systems  ?Positive: + L foot pain ?Negative: - leg swelling, chest pain, SOB ? ?Physical Exam  ?BP (!) 146/53 (BP Location: Right Arm)   Pulse 63   Temp 99.1 ?F (37.3 ?C) (Oral)   Resp 20   Ht '5\' 1"'$  (1.549 m)   Wt 59.9 kg   SpO2 100%   BMI 24.95 kg/m?  ?Gen:   Awake, no distress   ?Resp:  Normal effort  ?MSK:   Moves extremities without difficulty  ?Other:  Mild TTP to left arch along heel. 2+ DP pulse. No calf swelling or TTP.  ? ?Medical Decision Making  ?Medically screening exam initiated at 6:43 PM.  Appropriate orders placed.  Jesus [name] was informed that the remainder of the evaluation will be completed by another provider, this initial triage assessment does not replace that evaluation, and the importance of remaining in the ED until their evaluation is complete. ? ? ?  ?Eustaquio Maize, PA-C ?02/24/22 1846 ? ?

## 2022-02-24 NOTE — Discharge Instructions (Signed)
I have ordered an outpatient ultrasound study to be performed tomorrow to evaluate for a blood clot in the legs.  Please call his doctor and let them know what is going on and see you when they went to see you in the office.  Please return to the emergency department for chest pain trouble breathing if you pass out or feel like you might pass out for rapid swelling or redness to the legs. ?

## 2022-02-24 NOTE — ED Provider Notes (Signed)
?[facility] ?Provider Note ? ? ?CSN: [id number] ?Arrival date & time: 02/24/22  1827 ? ?  ? ?History ? ?Chief Complaint  ?Patient presents with  ? Leg Swelling  ? ? ?Jesus [name] is a 81 y.o. male. ? ?81 yo M with a chief complaints of left foot pain.  This been going on just since this morning.  Pain to the instep of the left foot.  Denies trauma to the area.  He is here with his daughter-in-law who thinks that his legs may be more swollen than normal.  He is not sure if they are more swollen than normal or not.  He had seen his doctor yesterday.  Denies chest pain or difficulty breathing.  Just had a cardiac catheterization couple weeks ago but in the other leg. ? ? ? ?  ? ?Home Medications ?Prior to Admission medications   ?Medication Sig Start Date End Date Taking? Authorizing Provider  ?diclofenac Sodium (VOLTAREN) 1 % GEL Apply 4 g topically 4 (four) times daily. 02/24/22  Yes Deno Etienne, DO  ?acetaminophen (TYLENOL) 500 MG tablet Take 500 mg by mouth every 6 (six) hours as needed for moderate pain.    [provider]  ?albuterol (VENTOLIN HFA) 108 (90 Base) MCG/ACT inhaler INHALE 2 PUFFS BY MOUTH EVERY 4 HOURS ASNEEDED FOR WHEEZING OR SHORTNESS OF BREATH. 01/27/21   [name], [name], MD  ?amiodarone (PACERONE) 200 MG tablet Take 2 tablets (400 mg total) by mouth 2 (two) times daily. Take 2 tabs ( 400 mg total) twice a day for 7 days then take 400 mg daily. 02/20/22   Isaiah Serge, NP  ?amLODipine (NORVASC) 10 MG tablet Take 1 tablet (10 mg total) by mouth daily. 02/21/22   Isaiah Serge, NP  ?amoxicillin (AMOXIL) 400 MG/5ML suspension TAKE 5 TEASPOONFULS BY MOUTH 1 HOUR PRIOR TO DENTAL PROCEDURE 04/19/21   [name], [name], MD  ?Ascorbic Acid (VITAMIN C) 1000 MG tablet Take 1,000 mg by mouth daily.    [provider]  ?aspirin 81 MG tablet Take 81 mg by mouth at bedtime.    [provider]  ?atorvastatin (LIPITOR) 80 MG tablet Take 1  tablet (80 mg total) by mouth daily. 02/21/22   Isaiah Serge, NP  ?clopidogrel (PLAVIX) 75 MG tablet Take 1 tablet (75 mg total) by mouth daily with breakfast. 02/21/22   Isaiah Serge, NP  ?loratadine (CLARITIN) 10 MG tablet Take 10 mg by mouth daily as needed for allergies.    [provider]  ?losartan (COZAAR) 50 MG tablet Take 1 tablet (50 mg total) by mouth daily. 02/20/22   Isaiah Serge, NP  ?metoprolol tartrate (LOPRESSOR) 25 MG tablet Take 1 tablet (25 mg total) by mouth 2 (two) times daily. 02/20/22   Isaiah Serge, NP  ?Multiple Vitamins-Minerals (MULTIVITAMIN,TX-MINERALS) tablet Take 1 tablet by mouth daily.    [provider]  ?nitroGLYCERIN (NITROSTAT) 0.4 MG SL tablet Place 1 tablet (0.4 mg total) under the tongue every 5 (five) minutes x 3 doses as needed for chest pain. 02/20/22   Isaiah Serge, NP  ?warfarin (COUMADIN) 4 MG tablet TAKE 1 TABLET BY MOUTH DAILY OR TAKE AS DIRECTED BY ANTICOAGULATION CLINIC 01/10/22   [name], [name], MD  ?   ? ?Allergies    ?Codeine and Iodine   ? ?Review of Systems   ?Review of Systems ? ?Physical Exam ?Updated Vital Signs ?BP (!) 146/53 (BP Location: Right  Arm)   Pulse 63   Temp 99.1 ?F (37.3 ?C) (Oral)   Resp 20   Ht '5\' 1"'$  (1.549 m)   Wt 59.9 kg   SpO2 100%   BMI 24.95 kg/m?  ?Physical Exam ?Vitals and nursing note reviewed.  ?Constitutional:   ?   Appearance: He is well-developed.  ?HENT:  ?   Head: Normocephalic and atraumatic.  ?Eyes:  ?   Pupils: Pupils are equal, round, and reactive to light.  ?Neck:  ?   Vascular: No JVD.  ?Cardiovascular:  ?   Rate and Rhythm: Normal rate and regular rhythm.  ?   Heart sounds: No murmur heard. ?  No friction rub. No gallop.  ?Pulmonary:  ?   Effort: No respiratory distress.  ?   Breath sounds: No wheezing.  ?Abdominal:  ?   General: There is no distension.  ?   Tenderness: There is no abdominal tenderness. There is no guarding or rebound.  ?Musculoskeletal:     ?   General: Normal range of  motion.  ?   Cervical back: Normal range of motion and neck supple.  ?   Comments: Pain along the navicular without any appreciable crepitus.  Pulse motor and sensation intact.  No duskiness to the toes.  No appreciable edema.  ?Skin: ?   Coloration: Skin is not pale.  ?   Findings: No rash.  ?Neurological:  ?   Mental Status: He is alert and oriented to person, place, and time.  ?Psychiatric:     ?   Behavior: Behavior normal.  ? ? ?ED Results / Procedures / Treatments   ?Labs ?(all labs ordered are listed, but only abnormal results are displayed) ?Labs Reviewed - No data to display ? ?EKG ?None ? ?Radiology ?DG Foot Complete Left ? ?Result Date: 02/24/2022 ?CLINICAL DATA:  Left foot pain and swelling, initial encounter EXAM: LEFT FOOT - COMPLETE 3+ VIEW COMPARISON:  None. FINDINGS: No acute fracture or dislocation is noted. No soft tissue abnormality is seen. Vascular calcifications are noted. IMPRESSION: No acute abnormality noted. Electronically Signed   By: Inez Catalina M.D.   On: 02/24/2022 19:17   ? ?Procedures ?Procedures  ? ? ?Medications Ordered in ED ?Medications - No data to display ? ?ED Course/ Medical Decision Making/ A&P ?  ?                        ?Medical Decision Making ? ?81 yo M with a chief complaints of left foot pain.  Patient has pain along the instep.  Could be planter fasciitis or strain.  He just had a cardiac catheterization and so the family had called his doctor and told him to come here to be evaluated.  Is on the leg opposite of the one where he had access.  There is no signs of embolization.  Intact pulses bilaterally.  I do not appreciate any edema though the family member that is with him that his concerned that he is swelling to both of his legs.  I reviewed his records and he was actually seen yesterday by his doctor who did not feel like he had any edema.  They are not sure as if it has changed from yesterday.  My suspicion for DVT is very low.  Unfortunately I do not have  access to vascular ultrasound at this time.  We will order this study as an outpatient.  We will have them follow-up with their doctor. ? ?  He did have a plain film of the foot here, independently interpreted by me without fracture. ? ?9:16 PM:  I have discussed the diagnosis/risks/treatment options with the patient.  Evaluation and diagnostic testing in the emergency department does not suggest an emergent condition requiring admission or immediate intervention beyond what has been performed at this time.  They will follow up with  PCP, cards. We also discussed returning to the ED immediately if new or worsening sx occur. We discussed the sx which are most concerning (e.g., sudden worsening pain, fever, inability to tolerate by mouth) that necessitate immediate return. Medications administered to the patient during their visit and any new prescriptions provided to the patient are listed below. ? ?Medications given during this visit ?Medications - No data to display ? ? ?The patient appears reasonably screen and/or stabilized for discharge and I doubt any other medical condition or other [facility] requiring further screening, evaluation, or treatment in the ED at this time prior to discharge.  ? ? ? ? ? ? ? ? ?Final Clinical Impression(s) / ED Diagnoses ?Final diagnoses:  ?Left foot pain  ? ? ?Rx / DC Orders ?ED Discharge Orders   ? ?      Ordered  ?  LE VENOUS       ? 02/24/22 2112  ?  diclofenac Sodium (VOLTAREN) 1 % GEL  4 times daily       ? 02/24/22 2112  ? ?  ?  ? ?  ? ? ?  ?Deno Etienne, DO ?02/24/22 2116 ? ?

## 2022-02-25 ENCOUNTER — Telehealth: Payer: Self-pay | Admitting: Family Medicine

## 2022-02-25 ENCOUNTER — Inpatient Hospital Stay: Payer: Medicare Other | Admitting: Family Medicine

## 2022-02-25 ENCOUNTER — Ambulatory Visit (HOSPITAL_BASED_OUTPATIENT_CLINIC_OR_DEPARTMENT_OTHER)
Admission: RE | Admit: 2022-02-25 | Discharge: 2022-02-25 | Disposition: A | Discharge status: Home / Self Care | Payer: Medicare Other | Source: Ambulatory Visit | Attending: Emergency Medicine | Admitting: Emergency Medicine

## 2022-02-25 DIAGNOSIS — M79605 Pain in left leg: Secondary | ICD-10-CM | POA: Insufficient documentation

## 2022-02-25 DIAGNOSIS — M79604 Pain in right leg: Secondary | ICD-10-CM

## 2022-02-25 DIAGNOSIS — M7989 Other specified soft tissue disorders: Secondary | ICD-10-CM | POA: Insufficient documentation

## 2022-02-25 NOTE — Progress Notes (Signed)
Lower extremity venous has been completed.  ? ?Preliminary results in CV Proc.  ? ?Jesus [name] ?02/25/2022 10:53 AM    ?

## 2022-02-25 NOTE — Telephone Encounter (Signed)
Left a message for Jesus [name] to return my call. ?

## 2022-02-25 NOTE — Telephone Encounter (Signed)
Letta Median called in stating that patient was prescribed diclofenac Sodium (VOLTAREN) 1 % GEL [[id number]]  for his foot but was advised if he takes aspirin not to take it. The patient does take aspirin so they wants to know why would they prescribe him that. Patient and Letta Median also wants to know since he didn't have any clots in his feet what could he do and what possibly could it be. ? ?Letta Median could be contacted at [phone] or [phone]. ? ?Please advise. ?

## 2022-02-25 NOTE — Telephone Encounter (Signed)
Letta Median informed of the message and verbalized understanding.  ?

## 2022-02-27 ENCOUNTER — Inpatient Hospital Stay (HOSPITAL_COMMUNITY)
Admission: EM | Admit: 2022-02-27 | Discharge: 2022-03-08 | DRG: 280 | Disposition: A | Discharge status: Home / Self Care | Payer: Medicare Other | Attending: Internal Medicine | Admitting: Internal Medicine

## 2022-02-27 ENCOUNTER — Encounter (HOSPITAL_COMMUNITY): Payer: Self-pay | Admitting: *Deleted

## 2022-02-27 ENCOUNTER — Other Ambulatory Visit: Payer: Self-pay

## 2022-02-27 ENCOUNTER — Emergency Department (HOSPITAL_COMMUNITY): Payer: Medicare Other

## 2022-02-27 DIAGNOSIS — N182 Chronic kidney disease, stage 2 (mild): Secondary | ICD-10-CM | POA: Diagnosis not present

## 2022-02-27 DIAGNOSIS — R778 Other specified abnormalities of plasma proteins: Secondary | ICD-10-CM

## 2022-02-27 DIAGNOSIS — I2510 Atherosclerotic heart disease of native coronary artery without angina pectoris: Secondary | ICD-10-CM | POA: Diagnosis present

## 2022-02-27 DIAGNOSIS — I951 Orthostatic hypotension: Secondary | ICD-10-CM | POA: Diagnosis not present

## 2022-02-27 DIAGNOSIS — I4819 Other persistent atrial fibrillation: Secondary | ICD-10-CM | POA: Diagnosis present

## 2022-02-27 DIAGNOSIS — I429 Cardiomyopathy, unspecified: Secondary | ICD-10-CM | POA: Diagnosis not present

## 2022-02-27 DIAGNOSIS — I252 Old myocardial infarction: Secondary | ICD-10-CM | POA: Diagnosis not present

## 2022-02-27 DIAGNOSIS — I10 Essential (primary) hypertension: Secondary | ICD-10-CM | POA: Diagnosis not present

## 2022-02-27 DIAGNOSIS — I214 Non-ST elevation (NSTEMI) myocardial infarction: Secondary | ICD-10-CM | POA: Diagnosis not present

## 2022-02-27 DIAGNOSIS — Z953 Presence of xenogenic heart valve: Secondary | ICD-10-CM | POA: Diagnosis not present

## 2022-02-27 DIAGNOSIS — Z9049 Acquired absence of other specified parts of digestive tract: Secondary | ICD-10-CM

## 2022-02-27 DIAGNOSIS — Z955 Presence of coronary angioplasty implant and graft: Secondary | ICD-10-CM | POA: Diagnosis not present

## 2022-02-27 DIAGNOSIS — I480 Paroxysmal atrial fibrillation: Secondary | ICD-10-CM | POA: Diagnosis present

## 2022-02-27 DIAGNOSIS — I25810 Atherosclerosis of coronary artery bypass graft(s) without angina pectoris: Secondary | ICD-10-CM | POA: Diagnosis present

## 2022-02-27 DIAGNOSIS — Z79899 Other long term (current) drug therapy: Secondary | ICD-10-CM

## 2022-02-27 DIAGNOSIS — Z91199 Patient's noncompliance with other medical treatment and regimen due to unspecified reason: Secondary | ICD-10-CM

## 2022-02-27 DIAGNOSIS — D539 Nutritional anemia, unspecified: Secondary | ICD-10-CM | POA: Diagnosis not present

## 2022-02-27 DIAGNOSIS — Z7982 Long term (current) use of aspirin: Secondary | ICD-10-CM

## 2022-02-27 DIAGNOSIS — D508 Other iron deficiency anemias: Secondary | ICD-10-CM | POA: Diagnosis not present

## 2022-02-27 DIAGNOSIS — N179 Acute kidney failure, unspecified: Secondary | ICD-10-CM | POA: Diagnosis not present

## 2022-02-27 DIAGNOSIS — D72829 Elevated white blood cell count, unspecified: Secondary | ICD-10-CM | POA: Diagnosis present

## 2022-02-27 DIAGNOSIS — Z7901 Long term (current) use of anticoagulants: Secondary | ICD-10-CM | POA: Diagnosis not present

## 2022-02-27 DIAGNOSIS — E782 Mixed hyperlipidemia: Secondary | ICD-10-CM | POA: Diagnosis present

## 2022-02-27 DIAGNOSIS — I130 Hypertensive heart and chronic kidney disease with heart failure and stage 1 through stage 4 chronic kidney disease, or unspecified chronic kidney disease: Secondary | ICD-10-CM | POA: Diagnosis not present

## 2022-02-27 DIAGNOSIS — Z7902 Long term (current) use of antithrombotics/antiplatelets: Secondary | ICD-10-CM

## 2022-02-27 DIAGNOSIS — K219 Gastro-esophageal reflux disease without esophagitis: Secondary | ICD-10-CM | POA: Diagnosis not present

## 2022-02-27 DIAGNOSIS — Z20822 Contact with and (suspected) exposure to covid-19: Secondary | ICD-10-CM | POA: Diagnosis present

## 2022-02-27 DIAGNOSIS — D631 Anemia in chronic kidney disease: Secondary | ICD-10-CM | POA: Diagnosis not present

## 2022-02-27 DIAGNOSIS — R5381 Other malaise: Secondary | ICD-10-CM | POA: Diagnosis not present

## 2022-02-27 DIAGNOSIS — D509 Iron deficiency anemia, unspecified: Secondary | ICD-10-CM | POA: Diagnosis not present

## 2022-02-27 DIAGNOSIS — I4729 Other ventricular tachycardia: Secondary | ICD-10-CM

## 2022-02-27 DIAGNOSIS — I509 Heart failure, unspecified: Principal | ICD-10-CM

## 2022-02-27 DIAGNOSIS — Z885 Allergy status to narcotic agent status: Secondary | ICD-10-CM

## 2022-02-27 DIAGNOSIS — I5023 Acute on chronic systolic (congestive) heart failure: Secondary | ICD-10-CM | POA: Diagnosis not present

## 2022-02-27 DIAGNOSIS — Z888 Allergy status to other drugs, medicaments and biological substances status: Secondary | ICD-10-CM

## 2022-02-27 DIAGNOSIS — N189 Chronic kidney disease, unspecified: Secondary | ICD-10-CM | POA: Diagnosis not present

## 2022-02-27 DIAGNOSIS — D649 Anemia, unspecified: Secondary | ICD-10-CM

## 2022-02-27 DIAGNOSIS — Z951 Presence of aortocoronary bypass graft: Secondary | ICD-10-CM | POA: Diagnosis not present

## 2022-02-27 DIAGNOSIS — I5022 Chronic systolic (congestive) heart failure: Secondary | ICD-10-CM | POA: Diagnosis present

## 2022-02-27 DIAGNOSIS — R0789 Other chest pain: Secondary | ICD-10-CM | POA: Diagnosis not present

## 2022-02-27 DIAGNOSIS — I501 Left ventricular failure: Secondary | ICD-10-CM

## 2022-02-27 DIAGNOSIS — I255 Ischemic cardiomyopathy: Secondary | ICD-10-CM | POA: Diagnosis present

## 2022-02-27 DIAGNOSIS — J9601 Acute respiratory failure with hypoxia: Secondary | ICD-10-CM | POA: Diagnosis present

## 2022-02-27 DIAGNOSIS — Z66 Do not resuscitate: Secondary | ICD-10-CM | POA: Diagnosis not present

## 2022-02-27 DIAGNOSIS — I472 Ventricular tachycardia, unspecified: Secondary | ICD-10-CM | POA: Diagnosis present

## 2022-02-27 DIAGNOSIS — Z961 Presence of intraocular lens: Secondary | ICD-10-CM | POA: Diagnosis present

## 2022-02-27 DIAGNOSIS — Z8249 Family history of ischemic heart disease and other diseases of the circulatory system: Secondary | ICD-10-CM

## 2022-02-27 LAB — BASIC METABOLIC PANEL
Anion gap: 10 (ref 5–15)
BUN: 29 mg/dL — ABNORMAL HIGH (ref 8–23)
CO2: 16 mmol/L — ABNORMAL LOW (ref 22–32)
Calcium: 8.1 mg/dL — ABNORMAL LOW (ref 8.9–10.3)
Chloride: 111 mmol/L (ref 98–111)
Creatinine, Ser: 1.13 mg/dL (ref 0.61–1.24)
GFR, Estimated: >60 mL/min (ref >60)
Glucose, Bld: 103 mg/dL — ABNORMAL HIGH (ref 70–99)
Potassium: 4.3 mmol/L (ref 3.5–5.1)
Sodium: 137 mmol/L (ref 135–145)

## 2022-02-27 LAB — RESP PANEL BY RT-PCR (FLU A&B, COVID) ARPGX2
Influenza A by PCR: NEGATIVE
Influenza B by PCR: NEGATIVE
SARS Coronavirus 2 by RT PCR: NEGATIVE

## 2022-02-27 LAB — BRAIN NATRIURETIC PEPTIDE: B Natriuretic Peptide: 1033.5 pg/mL — ABNORMAL HIGH (ref 0.0–100.0)

## 2022-02-27 LAB — PROTIME-INR
INR: 2.9 — ABNORMAL HIGH (ref 0.8–1.2)
Prothrombin Time: 30.3 seconds — ABNORMAL HIGH (ref 11.4–15.2)

## 2022-02-27 LAB — TROPONIN I (HIGH SENSITIVITY)
Troponin I (High Sensitivity): 37 ng/L — ABNORMAL HIGH (ref <18)
Troponin I (High Sensitivity): 48 ng/L — ABNORMAL HIGH (ref <18)

## 2022-02-27 LAB — CBC WITH DIFFERENTIAL/PLATELET
Abs Immature Granulocytes: 0.05 10*3/uL (ref 0.00–0.07)
Basophils Absolute: 0 10*3/uL (ref 0.0–0.1)
Basophils Relative: 0 %
Eosinophils Absolute: 0.1 10*3/uL (ref 0.0–0.5)
Eosinophils Relative: 1 %
HCT: 31.8 % — ABNORMAL LOW (ref 39.0–52.0)
Hemoglobin: 10.2 g/dL — ABNORMAL LOW (ref 13.0–17.0)
Immature Granulocytes: 0 %
Lymphocytes Relative: 11 %
Lymphs Abs: 1.2 10*3/uL (ref 0.7–4.0)
MCH: 33.2 pg (ref 26.0–34.0)
MCHC: 32.1 g/dL (ref 30.0–36.0)
MCV: 103.6 fL — ABNORMAL HIGH (ref 80.0–100.0)
Monocytes Absolute: 1 10*3/uL (ref 0.1–1.0)
Monocytes Relative: 9 %
Neutro Abs: 8.8 10*3/uL — ABNORMAL HIGH (ref 1.7–7.7)
Neutrophils Relative %: 79 %
Platelets: 209 10*3/uL (ref 150–400)
RBC: 3.07 MIL/uL — ABNORMAL LOW (ref 4.22–5.81)
RDW: 13 % (ref 11.5–15.5)
WBC: 11.2 10*3/uL — ABNORMAL HIGH (ref 4.0–10.5)
nRBC: 0 % (ref 0.0–0.2)

## 2022-02-27 LAB — MRSA NEXT GEN BY PCR, NASAL: MRSA by PCR Next Gen: NOT DETECTED

## 2022-02-27 LAB — POC OCCULT BLOOD, ED: Fecal Occult Bld: NEGATIVE

## 2022-02-27 MED ORDER — ACETAMINOPHEN 650 MG RE SUPP
650.0000 mg | Freq: Four times a day (QID) | RECTAL | Status: DC | PRN
  Filled 2022-02-27: qty 1

## 2022-02-27 MED ORDER — NITROGLYCERIN 0.4 MG SL SUBL: 0.4000 mg | SUBLINGUAL_TABLET | SUBLINGUAL | Status: DC | PRN

## 2022-02-27 MED ORDER — ACETAMINOPHEN 325 MG PO TABS
650.0000 mg | ORAL_TABLET | Freq: Four times a day (QID) | ORAL | Status: DC | PRN
  Administered 2022-03-01 – 2022-03-02 (×3): 650 mg via ORAL
  Filled 2022-02-27 (×3): qty 2

## 2022-02-27 MED ORDER — LORATADINE 10 MG PO TABS: 10.0000 mg | ORAL_TABLET | Freq: Every day | ORAL | Status: DC | PRN

## 2022-02-27 MED ORDER — AMIODARONE HCL 200 MG PO TABS
400.0000 mg | ORAL_TABLET | Freq: Every day | ORAL | Status: DC
  Administered 2022-02-28 – 2022-03-08 (×9): 400 mg via ORAL
  Filled 2022-02-27 (×9): qty 2

## 2022-02-27 MED ORDER — HYDRALAZINE HCL 20 MG/ML IJ SOLN: 10.0000 mg | Freq: Four times a day (QID) | INTRAMUSCULAR | Status: DC | PRN

## 2022-02-27 MED ORDER — WARFARIN - PHARMACIST DOSING INPATIENT
Freq: Every day | Status: DC
Start: 2022-02-28 — End: 2022-03-08

## 2022-02-27 MED ORDER — AMLODIPINE BESYLATE 10 MG PO TABS
10.0000 mg | ORAL_TABLET | Freq: Every day | ORAL | Status: DC
  Administered 2022-02-28: 10 mg via ORAL
  Filled 2022-02-27: qty 1

## 2022-02-27 MED ORDER — FUROSEMIDE 10 MG/ML IJ SOLN
40.0000 mg | Freq: Once | INTRAMUSCULAR | Status: AC
  Administered 2022-02-27: 40 mg via INTRAVENOUS
  Filled 2022-02-27: qty 4

## 2022-02-27 MED ORDER — ONDANSETRON HCL 4 MG PO TABS: 4.0000 mg | ORAL_TABLET | Freq: Four times a day (QID) | ORAL | Status: DC | PRN

## 2022-02-27 MED ORDER — ADULT MULTIVITAMIN W/MINERALS CH
1.0000 | ORAL_TABLET | Freq: Every day | ORAL | Status: DC
  Administered 2022-02-28 – 2022-03-08 (×9): 1 via ORAL
  Filled 2022-02-27 (×9): qty 1

## 2022-02-27 MED ORDER — WARFARIN SODIUM 4 MG PO TABS: 4.0000 mg | ORAL_TABLET | Freq: Every evening | ORAL | Status: DC

## 2022-02-27 MED ORDER — METOPROLOL TARTRATE 25 MG PO TABS
25.0000 mg | ORAL_TABLET | Freq: Two times a day (BID) | ORAL | Status: DC
  Administered 2022-02-27 – 2022-03-02 (×6): 25 mg via ORAL
  Filled 2022-02-27 (×7): qty 1

## 2022-02-27 MED ORDER — ATORVASTATIN CALCIUM 80 MG PO TABS
80.0000 mg | ORAL_TABLET | Freq: Every day | ORAL | Status: DC
  Administered 2022-02-28 – 2022-03-08 (×9): 80 mg via ORAL
  Filled 2022-02-27 (×9): qty 1

## 2022-02-27 MED ORDER — FUROSEMIDE 10 MG/ML IJ SOLN
40.0000 mg | Freq: Two times a day (BID) | INTRAMUSCULAR | Status: DC
  Administered 2022-02-28 – 2022-03-01 (×3): 40 mg via INTRAVENOUS
  Filled 2022-02-27 (×3): qty 4

## 2022-02-27 MED ORDER — PANTOPRAZOLE SODIUM 40 MG PO TBEC
40.0000 mg | DELAYED_RELEASE_TABLET | Freq: Every day | ORAL | Status: DC
  Administered 2022-02-28 – 2022-03-08 (×7): 40 mg via ORAL
  Filled 2022-02-27 (×9): qty 1

## 2022-02-27 MED ORDER — MELATONIN 5 MG PO TABS
10.0000 mg | ORAL_TABLET | Freq: Every evening | ORAL | Status: DC | PRN
Start: 2022-02-27 — End: 2022-03-08

## 2022-02-27 MED ORDER — CLOPIDOGREL BISULFATE 75 MG PO TABS
75.0000 mg | ORAL_TABLET | Freq: Every day | ORAL | Status: DC
  Administered 2022-02-28 – 2022-03-08 (×9): 75 mg via ORAL
  Filled 2022-02-27 (×9): qty 1

## 2022-02-27 MED ORDER — POLYETHYLENE GLYCOL 3350 17 G PO PACK: 17.0000 g | PACK | Freq: Every day | ORAL | Status: DC | PRN

## 2022-02-27 MED ORDER — ONDANSETRON HCL 4 MG/2ML IJ SOLN: 4.0000 mg | Freq: Four times a day (QID) | INTRAMUSCULAR | Status: DC | PRN

## 2022-02-27 MED ORDER — LOSARTAN POTASSIUM 50 MG PO TABS
50.0000 mg | ORAL_TABLET | Freq: Every day | ORAL | Status: DC
  Administered 2022-02-28 – 2022-03-01 (×2): 50 mg via ORAL
  Filled 2022-02-27 (×2): qty 1

## 2022-02-27 MED ORDER — WARFARIN SODIUM 4 MG PO TABS
4.0000 mg | ORAL_TABLET | Freq: Every day | ORAL | Status: DC
  Administered 2022-02-28: 4 mg via ORAL
  Filled 2022-02-27: qty 1

## 2022-02-27 MED ORDER — ALBUTEROL SULFATE (2.5 MG/3ML) 0.083% IN NEBU: 3.0000 mL | INHALATION_SOLUTION | RESPIRATORY_TRACT | Status: DC | PRN

## 2022-02-27 NOTE — Assessment & Plan Note (Deleted)
?   Patient presenting with progressively worsening shortness of breath and peripheral edema ?? On evaluation BNP is markedly elevated with evidence of acute pulmonary edema on chest imaging all consistent with acute congestive heart failure ?Patient has been placed on intravenous diuretics. ?Strict input and output monitoring ?Supplemental oxygen for bouts of hypoxia ?Monitoring renal function and electrolytes with serial chemistires ?Daily weights ?Monitoring patient on telemetry ? ?

## 2022-02-27 NOTE — Assessment & Plan Note (Signed)
·   Slightly elevated serial troponins with flat trajectory of elevation °· Patient is chest pain-free °· Likely secondary to underlying illness, plaque rupture is unlikely °· Monitoring patient on telemetry ° ° ° ° ° ° °

## 2022-02-27 NOTE — Assessment & Plan Note (Addendum)
No chest pain, troponin elevation due to heart failure decompensation.  ?Continue clopidogrel.  ? ?

## 2022-02-27 NOTE — ED Provider Notes (Signed)
?[facility] ?Provider Note ? ? ?CSN: [id number] ?Arrival date & time: 02/27/22  1602 ? ?  ? ?History ? ?Chief Complaint  ?Patient presents with  ? Shortness of Breath  ? ? ?Jesus [name] is a 81 y.o. male. ? ?Pt is an 81 yo male with a pmhx significant for gerd, afib on coumadin, cad s/p CABG and stents (last stent placed on 02/17/22), aortic stenosis s/p aortic valve replacement, htn, cardiomyopathy.  Pt has been sob since last night.  Pt's sister-in-law gives most the hx.  Pt lives alone now that his wife died over the summer.  She helps to care for him.  Pt went to UC and was sent here for further eval of sob.  Pt denies cp.  No fevers.  Pt was here on 3/23 for leg swelling.  Pt had bilateral leg ultrasounds which did not show a dvt.  EF on ECHO done on 3/15 showed an EF of 35-40%. Pt is not on any diuretics. ? ? ?  ? ?Home Medications ?Prior to Admission medications   ?Medication Sig Start Date End Date Taking? Authorizing Provider  ?acetaminophen (TYLENOL) 500 MG tablet Take 500 mg by mouth every 6 (six) hours as needed for moderate pain.    [provider]  ?albuterol (VENTOLIN HFA) 108 (90 Base) MCG/ACT inhaler INHALE 2 PUFFS BY MOUTH EVERY 4 HOURS ASNEEDED FOR WHEEZING OR SHORTNESS OF BREATH. 01/27/21   [name], [name], MD  ?amiodarone (PACERONE) 200 MG tablet Take 2 tablets (400 mg total) by mouth 2 (two) times daily. Take 2 tabs ( 400 mg total) twice a day for 7 days then take 400 mg daily. 02/20/22   Isaiah Serge, NP  ?amLODipine (NORVASC) 10 MG tablet Take 1 tablet (10 mg total) by mouth daily. 02/21/22   Isaiah Serge, NP  ?amoxicillin (AMOXIL) 400 MG/5ML suspension TAKE 5 TEASPOONFULS BY MOUTH 1 HOUR PRIOR TO DENTAL PROCEDURE 04/19/21   [name], [name], MD  ?Ascorbic Acid (VITAMIN C) 1000 MG tablet Take 1,000 mg by mouth daily.    [provider]  ?aspirin 81 MG tablet Take 81 mg by mouth at bedtime.    [provider]   ?atorvastatin (LIPITOR) 80 MG tablet Take 1 tablet (80 mg total) by mouth daily. 02/21/22   Isaiah Serge, NP  ?clopidogrel (PLAVIX) 75 MG tablet Take 1 tablet (75 mg total) by mouth daily with breakfast. 02/21/22   Isaiah Serge, NP  ?diclofenac Sodium (VOLTAREN) 1 % GEL Apply 4 g topically 4 (four) times daily. 02/24/22   Deno Etienne, DO  ?loratadine (CLARITIN) 10 MG tablet Take 10 mg by mouth daily as needed for allergies.    [provider]  ?losartan (COZAAR) 50 MG tablet Take 1 tablet (50 mg total) by mouth daily. 02/20/22   Isaiah Serge, NP  ?metoprolol tartrate (LOPRESSOR) 25 MG tablet Take 1 tablet (25 mg total) by mouth 2 (two) times daily. 02/20/22   Isaiah Serge, NP  ?Multiple Vitamins-Minerals (MULTIVITAMIN,TX-MINERALS) tablet Take 1 tablet by mouth daily.    [provider]  ?nitroGLYCERIN (NITROSTAT) 0.4 MG SL tablet Place 1 tablet (0.4 mg total) under the tongue every 5 (five) minutes x 3 doses as needed for chest pain. 02/20/22   Isaiah Serge, NP  ?warfarin (COUMADIN) 4 MG tablet TAKE 1 TABLET BY MOUTH DAILY OR TAKE AS DIRECTED BY ANTICOAGULATION CLINIC 01/10/22   [name], [name], MD  ?   ? ?  Allergies    ?Codeine and Iodine   ? ?Review of Systems   ?Review of Systems  ?Respiratory:  Positive for shortness of breath.   ?All other systems reviewed and are negative. ? ?Physical Exam ?Updated Vital Signs ?BP (!) 145/67   Pulse 65   Temp 98 ?F (36.7 ?C)   Resp (!) 28   Ht '5\' 1"'$  (1.549 m)   Wt 59.9 kg   SpO2 92%   BMI 24.95 kg/m?  ?Physical Exam ?Vitals and nursing note reviewed.  ?Constitutional:   ?   Appearance: He is well-developed.  ?HENT:  ?   Head: Normocephalic and atraumatic.  ?   Mouth/Throat:  ?   Mouth: Mucous membranes are moist.  ?   Pharynx: Oropharynx is clear.  ?Eyes:  ?   Extraocular Movements: Extraocular movements intact.  ?   Pupils: Pupils are equal, round, and reactive to light.  ?Cardiovascular:  ?   Rate and Rhythm: Normal rate and regular rhythm.   ?Pulmonary:  ?   Effort: Tachypnea present.  ?   Breath sounds: Rhonchi present.  ?Abdominal:  ?   General: Bowel sounds are normal.  ?   Palpations: Abdomen is soft.  ?Musculoskeletal:     ?   General: Normal range of motion.  ?   Cervical back: Normal range of motion and neck supple.  ?   Right lower leg: Edema present.  ?   Left lower leg: Edema present.  ?Skin: ?   General: Skin is warm.  ?   Capillary Refill: Capillary refill takes less than 2 seconds.  ?Neurological:  ?   General: No focal deficit present.  ?   Mental Status: He is alert and oriented to person, place, and time.  ?Psychiatric:     ?   Mood and Affect: Mood normal.     ?   Behavior: Behavior normal.  ? ? ?ED Results / Procedures / Treatments   ?Labs ?(all labs ordered are listed, but only abnormal results are displayed) ?Labs Reviewed  ?BASIC METABOLIC PANEL - Abnormal; Notable for the following components:  ?    Result Value  ? CO2 16 (*)   ? Glucose, Bld 103 (*)   ? BUN 29 (*)   ? Calcium 8.1 (*)   ? All other components within normal limits  ?CBC WITH DIFFERENTIAL/PLATELET - Abnormal; Notable for the following components:  ? WBC 11.2 (*)   ? RBC 3.07 (*)   ? Hemoglobin 10.2 (*)   ? HCT 31.8 (*)   ? MCV 103.6 (*)   ? Neutro Abs 8.8 (*)   ? All other components within normal limits  ?BRAIN NATRIURETIC PEPTIDE - Abnormal; Notable for the following components:  ? B Natriuretic Peptide 1,033.5 (*)   ? All other components within normal limits  ?PROTIME-INR - Abnormal; Notable for the following components:  ? Prothrombin Time 30.3 (*)   ? INR 2.9 (*)   ? All other components within normal limits  ?TROPONIN I (HIGH SENSITIVITY) - Abnormal; Notable for the following components:  ? Troponin I (High Sensitivity) 37 (*)   ? All other components within normal limits  ?RESP PANEL BY RT-PCR (FLU A&B, COVID) ARPGX2  ?POC OCCULT BLOOD, ED  ?TROPONIN I (HIGH SENSITIVITY)  ? ? ?EKG ?EKG Interpretation ? ?Date/Time:  Sunday February 27 2022 17:18:38  EDT ?Ventricular Rate:  59 ?PR Interval:  211 ?QRS Duration: 132 ?QT Interval:  485 ?QTC Calculation: 481 ?R Axis:  61 ?Text Interpretation: Sinus rhythm Atrial premature complex Probable left ventricular hypertrophy Borderline prolonged QT interval No significant change since last tracing Confirmed by Isla Pence [phone]) on 02/27/2022 5:44:40 PM ? ?Radiology ?DG Chest Port 1 View ? ?Result Date: 02/27/2022 ?CLINICAL DATA:  Shortness of breath EXAM: PORTABLE CHEST 1 VIEW COMPARISON:  Previous studies including the examination of 02/15/2022 FINDINGS: Transverse diameter of heart is increased. Stent is seen in the region of aortic valve. Metallic sutures are seen in the sternum. There are no signs of alveolar pulmonary edema. There is slight prominence of interstitial markings in the parahilar regions and lower lung fields. There is no focal pulmonary consolidation. There is minimal blunting of lateral CP angles. There is no pneumothorax. IMPRESSION: Cardiomegaly. There is slight prominence of interstitial markings in the parahilar regions and lower lung fields suggesting possible mild interstitial edema. There is minimal blunting of lateral CP angles suggesting possible small bilateral pleural effusions. There is no focal pulmonary consolidation. Electronically Signed   By: Elmer Picker M.D.   On: 02/27/2022 16:54   ? ?Procedures ?Procedures  ? ? ?Medications Ordered in ED ?Medications  ?furosemide (LASIX) injection 40 mg (40 mg Intravenous Given 02/27/22 1825)  ? ? ?ED Course/ Medical Decision Making/ A&P ?  ?                        ?Medical Decision Making ?Amount and/or Complexity of Data Reviewed ?Labs: ordered. ?Radiology: ordered. ? ?Risk ?Prescription drug management. ?Decision regarding hospitalization. ? ? ?This patient presents to the ED for concern of sob, this involves an extensive number of treatment options, and is a complaint that carries with it a high risk of complications and morbidity.   The differential diagnosis includes chf, pna, cad, covid/flu ? ? ?Co morbidities that complicate the patient evaluation ? ?gerd, afib on coumadin, cad s/p CABG and stents (last stent placed on 02/17/22), aortic stenosis s/p aortic va

## 2022-02-27 NOTE — ED Triage Notes (Signed)
The pt is c/o some sob since last pm his sister-in- law that he lives with  reports a noisy breathing pattern also   the pt was here in the ed last week for feet and leg swelling.    He also had a stent placed  at that admission takes coumadin ?

## 2022-02-27 NOTE — Assessment & Plan Note (Addendum)
History of non sustained ventricular tachycardia.   ? ?Improved volume status, continue rate control for atrial fibrillation. ?Anticoagulation with warfarin, INR is 3.3 ? ? ?

## 2022-02-27 NOTE — Progress Notes (Signed)
ANTICOAGULATION CONSULT NOTE - Follow Up Consult ? ?Pharmacy Consult for Warfarin ?Indication: TAVR / PAF ? ?Allergies  ?Allergen Reactions  ? Codeine Other (See Comments)  ?  Pt does not remember  ? Iodine Swelling  ? ? ?Patient Measurements: ?Height: '5\' 1"'$  (154.9 cm) ?Weight: 59.9 kg (132 lb 0.9 oz) ?IBW/kg (Calculated) : 52.3 ? ?Vital Signs: ?Temp: 98 ?F (36.7 ?C) (03/26 1807) ?BP: 145/67 (03/26 1900) ?Pulse Rate: 65 (03/26 1900) ? ?Labs: ?Recent Labs  ?  02/27/22 ?1822  ?HGB 10.2*  ?HCT 31.8*  ?PLT 209  ?LABPROT 30.3*  ?INR 2.9*  ?CREATININE 1.13  ?TROPONINIHS 37*  ? ? ?Estimated Creatinine Clearance: 36.6 mL/min (by C-G formula based on SCr of 1.13 mg/dL). ? ? ?Assessment: ?On warfarin PTA - > 4 mg daily  ?INR therapeutic on admission ?Has already taken dose for today ? ?Goal of Therapy:  ?INR 2-3 ?Monitor platelets by anticoagulation protocol: Yes ?  ?Plan:  ?Continue Warfarin 4 mg po daily -- starting tomorrow ?Daily INR for now ? ?Thank you ?Anette Guarneri, PharmD ? ? ?02/27/2022,9:46 PM ? ? ?

## 2022-02-27 NOTE — Assessment & Plan Note (Signed)
?   Please see assessment and plan as above ?

## 2022-02-27 NOTE — Assessment & Plan Note (Addendum)
?   Diagnosed during recent hospitalization ?? Continuing maintenance regimen of amiodarone 400 mg daily and metoprolol initiated during last hospitalization ?? Monitoring on telemetry ?

## 2022-02-27 NOTE — Assessment & Plan Note (Addendum)
Continue with statin therapy.  ?

## 2022-02-27 NOTE — H&P (Signed)
?History and Physical  ? ? ?Patient: Jesus [name] MRN: [id number] DOA: 02/27/2022 ? ?Date of Service: the patient was seen and examined on 02/27/2022 ? ?Patient coming from: Home ? ?Chief Complaint:  ?Chief Complaint  ?Patient presents with  ? Shortness of Breath  ? ? ?HPI:  ? ?81 year old male with past medical history of coronary artery disease status post CABG (2017), NSTEMI 02/20/2022 S/P cardiac catheterization revealing severe multivessel disease status post DES to the SVG, ischemic cardiomyopathy (Echo 02/16/2022 EF 35 to 40% with G2DD), paroxysmal atrial fibrillation on Coumadin, aortic stenosis status post TAVR 2016, hyperlipidemia, hypertension who presents to [facility] emergency department with complaints of shortness of breath. ? ?Of note, patient was recently hospitalized at [facility] from 3/14 until 3/19 on the cardiology service for NSTEMI who underwent cardiac catheterization on 3/16 revealing severe multivessel disease status post DES of the SVG.  Hospital course was complicated by bouts of NSVT on 3/17 requiring administration of intravenous amiodarone and beta-blocker.  With the assistance of EP, patient was transitioned to oral amiodarone.  Patient also exhibited a short bout of acute kidney injury status post cath which quickly resolved.  Patient was eventually discharged on 3/19 with plan to keep patient on a combination of Coumadin and Plavix. ? ?Patient explains that over approximate the past week he has been experiencing increasing bilateral lower extremity swelling.  The symptoms continued to worsen, worse in the left foot and patient eventually presented to [facility] emergency department on 3/23 for evaluation.  Patient was evaluated, had bilateral lower extremity ultrasounds which were negative for DVT and discharged home at that time.  In the days that followed patient's edema continued to worsen and became associated with shortness of breath.  Shortness of  breath became particularly severe the evening of 3/25.  Shortness of breath is not severe in intensity, worse with minimal exertion and improved with rest.  Patient denies associated chest pain cough fever sick contacts recent travel or contact with confirmed COVID-19 infection. ? ?Patient symptoms continue to worsen until he eventually presented once again to [facility] emergency permit for evaluation. ? ?Upon evaluation in the emergency department patient was found to have a markedly elevated BNP of 1033.  Chest x-ray revealed evidence of cardiogenic pulmonary edema with bilateral pleural effusions.  ER provider was concerned for possible bleeding considering hemoglobin was 10.2, down from 12.5 one week ago however rectal exam revealed no evidence of blood and Hemoccult was additionally negative.  Patient was administered 40 mg of intravenous Lasix.  The hospitalist group was then called to assess the patient for admission to the hospital. ? ? ?Review of Systems: Review of Systems  ?Respiratory:  Positive for shortness of breath.   ?Cardiovascular:  Positive for leg swelling.  ?Neurological:  Positive for weakness.  ?All other systems reviewed and are negative. ? ? ?Past Medical History:  ?Diagnosis Date  ? 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 Aortic stenosis   ? mild with a mean aortic valve gradient of 12 mmHg  ? Atrial fibrillation ([place])   ? holding sinus rhythm on Amiodarone  ? CAD (coronary artery disease)   ? a. S/P Ant MI 1980;  b. 1997 S/P CABG x 8 (LIMA to diag-LAD, SVG to OM1-OM2-OM3, SVG to [facility] - Dr [name]);  c. 01/2015 Cath: 3VD, 8/8 patent grafts.  ? Cardiomyopathy   ? Dysrhythmia   ? Esophageal reflux   ? GERD (gastroesophageal  reflux disease)   ? Headache   ? History of colonoscopy   ? History of transesophageal echocardiography (TEE) for monitoring   ? Other and unspecified hyperlipidemia   ? Paroxysmal atrial fibrillation ([place]) 02/20/2022  ? S/P angioplasty  with stent 02/17/22 DES to proximal VG to PDA 02/20/2022  ? S/P TAVR (transcatheter aortic valve replacement)   ? a. 03/2015 26 mm Edwards Sapien 3 transcatheter heart valve placed via open left transfemoral approach.  ? Severe aortic stenosis 08/10/2012  ? Skin lesions, generalized   ? facial which may represent actinic keratoses and possible photosensitivity from Amiodarone  ? Unspecified essential hypertension   ? ? ?Past Surgical History:  ?Procedure Laterality Date  ? CARDIAC CATHETERIZATION    ? CARDIOVERSION  11/18/2006  ? Dr. [name]  ? CATARACT EXTRACTION W/ INTRAOCULAR LENS IMPLANT  April '13  (Dr. [name])  ? left eye only  ? CHOLECYSTECTOMY    ? CORONARY ARTERY BYPASS GRAFT  02/09/1996  ? LIMA to diag-LAD, SVG to OM1-OM2-OM3, SVG to [facility]  ? CORONARY STENT INTERVENTION N/A 02/17/2022  ? Procedure: CORONARY STENT INTERVENTION;  Surgeon: Burnell Blanks, MD;  Location: [place] CV LAB;  Service: Cardiovascular;  Laterality: N/A;  ? CORONARY/GRAFT ANGIOGRAPHY N/A 02/17/2022  ? Procedure: CORONARY/GRAFT ANGIOGRAPHY;  Surgeon: Burnell Blanks, MD;  Location: [facility] CV LAB;  Service: Cardiovascular;  Laterality: N/A;  ? EYE SURGERY    ? LEFT AND RIGHT HEART CATHETERIZATION WITH CORONARY/GRAFT ANGIOGRAM N/A 01/26/2015  ? Procedure: LEFT AND RIGHT HEART CATHETERIZATION WITH [name];  Surgeon: [name], MD;  Location: [facility] CATH LAB;  Service: Cardiovascular;  Laterality: N/A;  ? TEE WITHOUT CARDIOVERSION N/A 03/10/2015  ? Procedure: TRANSESOPHAGEAL ECHOCARDIOGRAM (TEE);  Surgeon: [name], MD;  Location: [facility];  Service: Open Heart Surgery;  Laterality: N/A;  ? TONSILLECTOMY    ? TRANSCATHETER AORTIC VALVE REPLACEMENT, TRANSFEMORAL N/A 03/10/2015  ? Procedure: TRANSCATHETER AORTIC VALVE REPLACEMENT, TRANSFEMORAL;  Surgeon: [name], MD;  Location: [facility];  Service: Open Heart Surgery;  Laterality: N/A;  ? ? ?Social History:  reports that he has never smoked.  He has never used smokeless tobacco. He reports that he does not drink alcohol and does not use drugs. ? ?Allergies  ?Allergen Reactions  ? Codeine Other (See Comments)  ?  Pt does not remember  ? Iodine Swelling  ? ? ?Family History  ?Problem Relation Age of Onset  ? Stroke Mother 51  ?     Deceased  ? Crohn's disease Mother   ?     Deceased  ? Heart attack Father 66  ?     Deceased  ? Heart disease Father   ? Heart failure Father   ?     Deceased  ? Atrial fibrillation Brother   ? Heart disease Paternal Uncle   ? Prostate cancer Paternal Uncle   ? Colon cancer Neg Hx   ? ? ?Prior to Admission medications   ?Medication Sig Start Date End Date Taking? Authorizing Provider  ?albuterol (VENTOLIN HFA) 108 (90 Base) MCG/ACT inhaler INHALE 2 PUFFS BY MOUTH EVERY 4 HOURS ASNEEDED FOR WHEEZING OR SHORTNESS OF BREATH. ?Patient taking differently: 2 puffs every 4 (four) hours as needed for wheezing or shortness of breath. 01/27/21  Yes [name], [name], MD  ?amiodarone (PACERONE) 200 MG tablet Take 2 tablets (400 mg total) by mouth 2 (two) times daily. Take 2 tabs ( 400 mg total) twice a day for 7 days then  take 400 mg daily. ?Patient taking differently: Take 400 mg by mouth daily. 02/20/22  Yes Isaiah Serge, NP  ?amLODipine (NORVASC) 10 MG tablet Take 1 tablet (10 mg total) by mouth daily. 02/21/22  Yes Isaiah Serge, NP  ?amoxicillin (AMOXIL) 400 MG/5ML suspension TAKE 5 TEASPOONFULS BY MOUTH 1 HOUR PRIOR TO DENTAL PROCEDURE ?Patient taking differently: Take 90 mg/kg/day by mouth daily as needed (dental appointment). TAKE 5 TEASPOONFULS BY MOUTH 1 HOUR PRIOR TO DENTAL PROCEDURE 04/19/21  Yes [name], [name], MD  ?Ascorbic Acid (VITAMIN C) 1000 MG tablet Take 1,000 mg by mouth daily.   Yes [provider]  ?aspirin 81 MG tablet Take 81 mg by mouth at bedtime.   Yes [provider]  ?atorvastatin (LIPITOR) 80 MG tablet Take 1 tablet (80 mg total) by mouth daily. 02/21/22  Yes Isaiah Serge, NP   ?clopidogrel (PLAVIX) 75 MG tablet Take 1 tablet (75 mg total) by mouth daily with breakfast. 02/21/22  Yes Isaiah Serge, NP  ?diclofenac Sodium (VOLTAREN) 1 % GEL Apply 4 g topically 4 (four) times daily.

## 2022-02-27 NOTE — Assessment & Plan Note (Addendum)
Iron panel with serum iron at 15, tibc 242, transferrin saturation 6, consistent with iron deficiency. ? ?Continue with IV iron supplementation.  ?Resolved reactive leukocytosis.  ? ?

## 2022-02-27 NOTE — Assessment & Plan Note (Addendum)
Continue with pantoprazole with good toleration.  ?

## 2022-02-27 NOTE — Assessment & Plan Note (Addendum)
Patient with orthostatic hypotension. ?DC amlodipine, losartan and hold on diuresis, continue blood pressure monitoring. ?On  spironolactone and metoprolol.   ? ?

## 2022-02-27 NOTE — Assessment & Plan Note (Signed)
?   Notable leukocytosis on arrival, which seems to be persisting since recent hospitalization ?? No associated fever ?? Obtaining procalcitonin, CRP in case chest infiltrates are actually infectious and not pulmonary edema which is unlikely. ?? Continue to monitor for any other evidence of infection ?? No antibiotics necessary at this time ?

## 2022-02-28 DIAGNOSIS — I480 Paroxysmal atrial fibrillation: Secondary | ICD-10-CM | POA: Diagnosis not present

## 2022-02-28 DIAGNOSIS — I25810 Atherosclerosis of coronary artery bypass graft(s) without angina pectoris: Secondary | ICD-10-CM | POA: Diagnosis not present

## 2022-02-28 DIAGNOSIS — D508 Other iron deficiency anemias: Secondary | ICD-10-CM

## 2022-02-28 DIAGNOSIS — I10 Essential (primary) hypertension: Secondary | ICD-10-CM | POA: Diagnosis not present

## 2022-02-28 DIAGNOSIS — I5023 Acute on chronic systolic (congestive) heart failure: Secondary | ICD-10-CM | POA: Diagnosis not present

## 2022-02-28 LAB — FOLATE: Folate: 63.6 ng/mL (ref >5.9)

## 2022-02-28 LAB — CBC WITH DIFFERENTIAL/PLATELET
Abs Immature Granulocytes: 0.06 10*3/uL (ref 0.00–0.07)
Basophils Absolute: 0.1 10*3/uL (ref 0.0–0.1)
Basophils Relative: 0 %
Eosinophils Absolute: 0 10*3/uL (ref 0.0–0.5)
Eosinophils Relative: 0 %
HCT: 25.1 % — ABNORMAL LOW (ref 39.0–52.0)
Hemoglobin: 8.6 g/dL — ABNORMAL LOW (ref 13.0–17.0)
Immature Granulocytes: 1 %
Lymphocytes Relative: 7 %
Lymphs Abs: 0.9 10*3/uL (ref 0.7–4.0)
MCH: 34.3 pg — ABNORMAL HIGH (ref 26.0–34.0)
MCHC: 34.3 g/dL (ref 30.0–36.0)
MCV: 100 fL (ref 80.0–100.0)
Monocytes Absolute: 1.3 10*3/uL — ABNORMAL HIGH (ref 0.1–1.0)
Monocytes Relative: 10 %
Neutro Abs: 11 10*3/uL — ABNORMAL HIGH (ref 1.7–7.7)
Neutrophils Relative %: 82 %
Platelets: 198 10*3/uL (ref 150–400)
RBC: 2.51 MIL/uL — ABNORMAL LOW (ref 4.22–5.81)
RDW: 12.9 % (ref 11.5–15.5)
WBC: 13.3 10*3/uL — ABNORMAL HIGH (ref 4.0–10.5)
nRBC: 0 % (ref 0.0–0.2)

## 2022-02-28 LAB — C-REACTIVE PROTEIN: CRP: 4.9 mg/dL — ABNORMAL HIGH (ref <1.0)

## 2022-02-28 LAB — COMPREHENSIVE METABOLIC PANEL
ALT: 46 U/L — ABNORMAL HIGH (ref 0–44)
AST: 35 U/L (ref 15–41)
Albumin: 3 g/dL — ABNORMAL LOW (ref 3.5–5.0)
Alkaline Phosphatase: 79 U/L (ref 38–126)
Anion gap: 10 (ref 5–15)
BUN: 27 mg/dL — ABNORMAL HIGH (ref 8–23)
CO2: 20 mmol/L — ABNORMAL LOW (ref 22–32)
Calcium: 8.3 mg/dL — ABNORMAL LOW (ref 8.9–10.3)
Chloride: 109 mmol/L (ref 98–111)
Creatinine, Ser: 1.32 mg/dL — ABNORMAL HIGH (ref 0.61–1.24)
GFR, Estimated: 54 mL/min — ABNORMAL LOW (ref >60)
Glucose, Bld: 114 mg/dL — ABNORMAL HIGH (ref 70–99)
Potassium: 3.6 mmol/L (ref 3.5–5.1)
Sodium: 139 mmol/L (ref 135–145)
Total Bilirubin: 0.9 mg/dL (ref 0.3–1.2)
Total Protein: 6 g/dL — ABNORMAL LOW (ref 6.5–8.1)

## 2022-02-28 LAB — BLOOD GAS, VENOUS
Acid-base deficit: 1.4 mmol/L (ref 0.0–2.0)
Bicarbonate: 22.4 mmol/L (ref 20.0–28.0)
O2 Saturation: 96.6 %
Patient temperature: 37.6
pCO2, Ven: 34 mmHg — ABNORMAL LOW (ref 44–60)
pH, Ven: 7.43 (ref 7.25–7.43)
pO2, Ven: 69 mmHg — ABNORMAL HIGH (ref 32–45)

## 2022-02-28 LAB — PROTIME-INR
INR: 2.9 — ABNORMAL HIGH (ref 0.8–1.2)
Prothrombin Time: 30 seconds — ABNORMAL HIGH (ref 11.4–15.2)

## 2022-02-28 LAB — IRON AND TIBC
Iron: 15 ug/dL — ABNORMAL LOW (ref 45–182)
Saturation Ratios: 6 % — ABNORMAL LOW (ref 17.9–39.5)
TIBC: 242 ug/dL — ABNORMAL LOW (ref 250–450)
UIBC: 227 ug/dL

## 2022-02-28 LAB — VITAMIN B12: Vitamin B-12: 620 pg/mL (ref 180–914)

## 2022-02-28 LAB — PROCALCITONIN: Procalcitonin: <0.1 ng/mL

## 2022-02-28 LAB — TYPE AND SCREEN
ABO/RH(D): O POS
Antibody Screen: NEGATIVE

## 2022-02-28 LAB — MAGNESIUM: Magnesium: 1.9 mg/dL (ref 1.7–2.4)

## 2022-02-28 LAB — LACTIC ACID, PLASMA: Lactic Acid, Venous: 0.9 mmol/L (ref 0.5–1.9)

## 2022-02-28 MED ORDER — SODIUM CHLORIDE 0.9 % IV SOLN
250.0000 mg | Freq: Every day | INTRAVENOUS | Status: AC
  Administered 2022-02-28 – 2022-03-01 (×2): 250 mg via INTRAVENOUS
  Filled 2022-02-28 (×2): qty 20

## 2022-02-28 MED ORDER — EMPAGLIFLOZIN 10 MG PO TABS
10.0000 mg | ORAL_TABLET | Freq: Every day | ORAL | Status: DC
  Administered 2022-02-28 – 2022-03-08 (×9): 10 mg via ORAL
  Filled 2022-02-28 (×9): qty 1

## 2022-02-28 MED ORDER — AMLODIPINE BESYLATE 5 MG PO TABS
5.0000 mg | ORAL_TABLET | Freq: Every day | ORAL | Status: DC
  Filled 2022-02-28: qty 1

## 2022-02-28 MED ORDER — SPIRONOLACTONE 12.5 MG HALF TABLET
12.5000 mg | ORAL_TABLET | Freq: Every day | ORAL | Status: DC
  Administered 2022-02-28 – 2022-03-03 (×4): 12.5 mg via ORAL
  Filled 2022-02-28 (×4): qty 1

## 2022-02-28 NOTE — Assessment & Plan Note (Addendum)
Acute hypoxemic respiratory failure due to acute cardiogenic pulmonary edema.  ?Elevated troponin due to heart failure exacerbation not acute coronary syndrome.  ? ?Urine output 2,400 cc over last 24 hrs ?Blood pressure 94 to 569 systolic.  ? ? ?Echocardiogram with reduced LV systolic function with EF 35 to 40%, with inferior and inferior lateral hypokinesis. Preserved RV systolic function. Left atrium with severe dilatation, no significant valvular disease.  (SP TAVR).  ? ?Patient with orthostatic hypotension. ?Plan to stop amlodipine, losartan and hold on further diuresis.  ?Continue with metoprolol, spironolactone and empagliflozin.  ?Close monitoring of blood pressure.  ? ?

## 2022-02-28 NOTE — Progress Notes (Signed)
?Progress Note ? ? ?Patient: Jesus [name] NFA:[id number] DOB: 1941-05-12 DOA: 02/27/2022     1 ?DOS: the patient was seen and examined on 02/28/2022 ?  ?Brief hospital course: ?Mr. Stankey was admitted to the hospital with the working diagnosis of decompensated heart failure.  ? ?81 yo male with the past medical history of coronary artery disease sp CABG, angioplasty, AS sp TAVR, heart failure, hypertension, dyslipidemia and paroxysmal atrial fibrillation, who presented with dyspnea. Recent hospitalization for NSTEMI on 03/14 to 03/19. Reported 7 days of worsening dyspnea and lower extremity edema. He presented to the ED on 03/23, [place] lower extremities were negative for DVT and he was discharged home. At home his symptoms continue to worsening prompting him to come back to the ED. On his ED evaluation his blood pressure was 145/75, HR 74, RR 24 and 02 saturation 92%, lungs with no wheezing or rhonchi, positive bilateral rales at bases, heart with S1 and S2 present and rhythmic, positive systolic murmur, abdomen not distended and positive lower extremity edema.  ? ? Na 137, K 4.3 CL 111, bicarbonate 16, glucose 103, bun 29 cr 1,13 ?BNP 1,033 ?High sensitive troponin 37  ?Wbc 11.2 hgb 10.2, hct 31,8 plt 209  ?INR 2,9  ?Sars covid 19 negative.  ? ?Chest radiograph with cardiomegaly, positive bilateral hilar vascular congestion, left base atelectasis.  ? ?EKG 59 bpm, normal axis, 1st degree AV block with interventricular conduction delay, sinus rhythm with no significant ST segment or T wave changes, LVH.  ? ?Patient was placed on furosemide for diuresis.  ? ?Assessment and Plan: ?* Acute on chronic systolic congestive heart failure ([place]) ?Patient with improved volume status but continue to have rales and need of supplemental 02 per Bal[place] ?Acute hypoxemic respiratory failure due to acute cardiogenic pulmonary edema.  ?Elevated troponin due to heart failure exacerbation not acute coronary syndrome.  ? ?Urine output  documented 800 cc over last 24 hrs, (question if accurate) ?Blood pressure 469 to 629 systolic.  ? ?Plan to continue diuresis with IV furosemide to target further negative fluid balance.  ?Continue blood pressure monitoring.  ? ?Echocardiogram with reduced LV systolic function with EF 35 to 40%, with inferior and inferior lateral hypokinesis. Preserved RV systolic function. Left atrium with severe dilatation, no significant valvular disease.  (SP TAVR).  ?Continue with losartan and metoprolol. ?Decreased dose of amlodipine and start patient on  low dose spironolactone.  ?Start patient with SGLT2i  ? ? ?Coronary artery disease involving coronary bypass graft of native heart without angina pectoris ?No chest pain, troponin elevation due to heart failure decompensation.  ?Continue clopidogrel.  ? ? ?Paroxysmal atrial fibrillation (HCC) ?History of non sustained ventricular tachycardia.   ? ?Continue rate control with metoprolol and anticoagulation with warfarin.  ? ? ? ? ?Essential hypertension ?Continue blood pressure monitoring, will transition from amlodipine to spironolactone considering patient's reduced LV systolic function.  ? ? ?Mixed hyperlipidemia ?Continue with statin therapy.  ? ? ?Iron deficiency anemia ?Stable hgb and hct, positive reactive leukocytosis, no clinical signs of systemic bacterial infection. ?Follow up cell count in am. ?Iron panel with serum iron at 15, tibc 242, transferrin saturation 6, consistent with iron deficiency. ?Add IV iron 2 doses.  ? ? ?GERD without esophagitis ?Continue with pantoprazole with good toleration.  ? ? ? ? ?  ? ?Subjective: patient with improvement in lower extremity edema and dyspnea, apparently at home not compliant with salt restriction  ? ?Physical Exam: ?Vitals:  ? 02/27/22 2147  02/27/22 2329 02/28/22 0300 02/28/22 0800  ?BP: (!) 154/55 (!) 131/53 (!) 124/55 (!) 122/47  ?Pulse: 72 62 60 (!) 51  ?Resp: (!) 31 (!) 28 20 (!) 26  ?Temp: 98.6 ?F (37 ?C) 99.8 ?F (37.7  ?C) 98.4 ?F (36.9 ?C)   ?TempSrc:  Oral Oral   ?SpO2: 97% 94% 96% 95%  ?Weight:   57.9 kg   ?Height:      ? ?Neurology awake and alert ?ENT with mild pallor ?Cardiovascular with S1 and S2 present and rhythmic with no gallops or murmurs,  ?Mild JVD ?No lower extremity edema ?Respiratory with rales bilaterally diffuse predominantly at the lower zones.  ?Abdomen soft and not distended  ?Data Reviewed: ? ? ? ?Family Communication: I spoke with patient's care giver at the bedside, we talked in detail about patient's condition, plan of care and prognosis and all questions were addressed. ? ? ?Disposition: ?Status is: Inpatient ?Remains inpatient appropriate because: heart failure decompensation ? Planned Discharge Destination: Home ? ? ? ? ?Author: ?Tawni Millers, MD ?02/28/2022 11:44 AM ? ?For on call review [email or web address].  ?

## 2022-02-28 NOTE — Progress Notes (Signed)
ANTICOAGULATION CONSULT NOTE - Follow Up Consult ? ?Pharmacy Consult for Warfarin ?Indication: TAVR / PAF ? ?Allergies  ?Allergen Reactions  ? Codeine Other (See Comments)  ?  Pt does not remember  ? Iodine Swelling  ? ? ?Patient Measurements: ?Height: '5\' 1"'$  (154.9 cm) ?Weight: 57.9 kg (127 lb 10.3 oz) ?IBW/kg (Calculated) : 52.3 ? ?Vital Signs: ?Temp: 98.4 ?F (36.9 ?C) (03/27 0300) ?Temp Source: Oral (03/27 0300) ?BP: 122/47 (03/27 0800) ?Pulse Rate: 51 (03/27 0800) ? ?Labs: ?Recent Labs  ?  02/27/22 ?1822 02/28/22 ?0058  ?HGB 10.2* 8.6*  ?HCT 31.8* 25.1*  ?PLT 209 198  ?LABPROT 30.3* 30.0*  ?INR 2.9* 2.9*  ?CREATININE 1.13 1.32*  ?TROPONINIHS 48*  37*  --   ? ? ? ?Estimated Creatinine Clearance: 31.4 mL/min (A) (by C-G formula based on SCr of 1.32 mg/dL (H)). ? ? ?Assessment: ?On warfarin PTA - > 4 mg daily  ?INR continues to be therapeutic at 2.9. Hemoglobin is down from 10.2 to 8.6. No bleeding issues noted.  ? ?Goal of Therapy:  ?INR 2-3 ?Monitor platelets by anticoagulation protocol: Yes ?  ?Plan:  ?Continue Warfarin 4 mg po daily -- starting tomorrow ?Daily INR for now ? ?Thank you, ?Erin Hearing PharmD., BCPS ?Clinical Pharmacist ?02/28/2022 10:12 AM ? ?

## 2022-02-28 NOTE — Progress Notes (Signed)
Heart Failure Nurse Navigator Progress Note ? ?PCP: [name], MD ?PCP-Cardiologist: [name] ?Admission Diagnosis:  ?Admitted from: Home ? ?Presentation:   ?Jesus [name] presented with Leg swelling, pain in left foot. Just d'c from hospital where he had a stent placed on 02/17/22. Been SOB for last few days, denies any chest pain. Patient seen also on 3/23 for leg swelling, negative for DVT's. He lives alone and still works for [employer]. EF 30-35%, states taking his medication as prescribed, however he doesn't follow his diet or fluid restrictions too closely. Eats out a number times a week. He states he never has weighed himself, because he wasn't told to do so. Education was done in legnth on fluid and sodium restrictions, the importance of daily weights and when to call his doctor. The signs and symptoms of Heart failure. Patient has a TOC appointment for 03/09/22 @ 10 am. Optimize his medications and continue education on fluid and diet restrictions as well as importance of daily weights.  ? ?ECHO/ LVEF: 35-40% ? ?Clinical Course: ? ?Past Medical History:  ?Diagnosis Date  ? Acute myocardial infarction of inferior wall ([place]) 1980  ? Aortic stenosis   ? mild with a mean aortic valve gradient of 12 mmHg  ? Atrial fibrillation ([place])   ? holding sinus rhythm on Amiodarone  ? CAD (coronary artery disease)   ? a. S/P Ant MI 1980;  b. 1997 S/P CABG x 8 (LIMA to diag-LAD, SVG to OM1-OM2-OM3, SVG to [facility] - Dr [name]);  c. 01/2015 Cath: 3VD, 8/8 patent grafts.  ? Cardiomyopathy   ? Dysrhythmia   ? Esophageal reflux   ? GERD (gastroesophageal reflux disease)   ? Headache   ? History of colonoscopy   ? History of transesophageal echocardiography (TEE) for monitoring   ? Other and unspecified hyperlipidemia   ? Paroxysmal atrial fibrillation ([place]) 02/20/2022  ? S/P angioplasty with stent 02/17/22 DES to proximal VG to PDA 02/20/2022  ? S/P TAVR (transcatheter aortic valve replacement)   ? a. 03/2015 26 mm Edwards  Sapien 3 transcatheter heart valve placed via open left transfemoral approach.  ? Severe aortic stenosis 08/10/2012  ? Skin lesions, generalized   ? facial which [name] represent actinic keratoses and possible photosensitivity from Amiodarone  ? Unspecified essential hypertension   ?  ? ?Social History  ? ?Socioeconomic History  ? Marital status: Married  ?  Spouse name: Not on file  ? Number of children: Not on file  ? Years of education: Not on file  ? Highest education level: Not on file  ?Occupational History  ? Not on file  ?Tobacco Use  ? Smoking status: Never  ? Smokeless tobacco: Never  ? Tobacco comments:  ?  Does not smoke.  ?Vaping Use  ? Vaping Use: Never used  ?Substance and Sexual Activity  ? Alcohol use: No  ?  Alcohol/week: 0.0 standard drinks  ? Drug use: No  ? Sexual activity: Not Currently  ?Other Topics Concern  ? Not on file  ?Social History Narrative  ? [employer]. [employer] 6 years. Married - '69 - 1 year/divorced. '76 - . No children. Work - mfg/textiles - Designer, multimedia; currently works doing maintenance. [employer] - they have discussed this. Provided packet august '13.   ?   ?   ?   ?   ? ?Social Determinants of Health  ? ?Financial Resource Strain: Low Risk   ? Difficulty of Paying Living Expenses: Not hard at  all  ?Food Insecurity: No Food Insecurity  ? Worried About Charity fundraiser in the Last Year: Never true  ? Ran Out of Food in the Last Year: Never true  ?Transportation Needs: No Transportation Needs  ? Lack of Transportation (Medical): No  ? Lack of Transportation (Non-Medical): No  ?Physical Activity: Sufficiently Active  ? Days of Exercise per Week: 5 days  ? Minutes of Exercise per Session: 60 min  ?Stress: No Stress Concern Present  ? Feeling of Stress : Not at all  ?Social Connections: Moderately Integrated  ? Frequency of Communication with Friends and Family: Three times a week  ? Frequency of Social Gatherings with Friends and Family: Three times a week  ? Attends Religious  Services: More than 4 times per year  ? Active Member of Clubs or Organizations: Yes  ? Attends Archivist Meetings: More than 4 times per year  ? Marital Status: Widowed  ? ? ?High Risk Criteria for Readmission and/or Poor Patient Outcomes: ?Heart failure hospital admissions (last 6 months):  2 ?No Show rate:  ?Difficult social situation: 1 % ?Demonstrates medication adherence: yes per patient ?Primary Language: English ?Literacy level: Reading and writing, comprehends ? ?Barriers of Care:   ?Daily weights ?Fluid and diet restrictions ?Hard of hearing ? ?Considerations/Referrals:  ? ?Referral made to Heart Failure Pharmacist Stewardship: yes, optimize ?Referral made to Heart Failure CSW/NCM TOC: NA ?Referral made to Heart & Vascular TOC clinic: yes, 03/09/22 ? ?Items for Follow-up on DC/TOC: ?Optimize meds ?Daily weights ?Fluid and diet restrictions ? ? ?Earnestine Leys, BSN, RN ?Heart Failure Nurse Navigator ?[phone]   ?

## 2022-02-28 NOTE — Evaluation (Signed)
Physical Therapy Evaluation ?Patient Details ?Name: Jesus [name] ?MRN: [id number] ?DOB: 15-Oct-1941 ?Today's Date: 02/28/2022 ? ?History of Present Illness ? The pt is an 81 yo male presenting 3/26 with SOB. Pt recently here for bilateral LE edema on 3/23 and DVT was ruled out so he was d/c home on 3/24. This admission pt found to have pulmonary edema and bilateral pleural effusions. PMH includes: CAD s/p CABG, NSTEMI 02/20/22 s/p cardiac cath with stent placement, ischemic cardiomyopathy, afib on coumadin, aortic stenosis s/p TAVR 2016, HLD, and HTN. ?  ?Clinical Impression ? Pt in bed upon arrival of PT, agreeable to evaluation at this time. Prior to admission the pt was mobilizing without need for DME (but states he has a RW and cane at home). He has not returned to work following recent admission, but is hopeful he will be able to soon. The pt has family available after d/c to assist with mobility and ADLs. He was able to complete sit-stand transfers and hallway ambulation without use of RW, but did have posterior LOB with stand and intermittent staggering steps to L and R with gait needing min-modA to steady. Discussed short term use of RW for improved stability, will continue to benefit from skilled PT acutely and after d/c to reduce risk of falls and return to mobility without need for RW.  ?   ?   ? ?Recommendations for follow up therapy are one component of a multi-disciplinary discharge planning process, led by the attending physician.  Recommendations may be updated based on patient status, additional functional criteria and insurance authorization. ? ?Follow Up Recommendations Home health PT ? ?  ?Assistance Recommended at Discharge Intermittent Supervision/Assistance  ?Patient can return home with the following ? A little help with walking and/or transfers;A little help with bathing/dressing/bathroom;Assist for transportation;Help with stairs or ramp for entrance ? ?  ?Equipment Recommendations BSC/3in1   ?Recommendations for Other Services ?    ?  ?Functional Status Assessment Patient has had a recent decline in their functional status and demonstrates the ability to make significant improvements in function in a reasonable and predictable amount of time.  ? ?  ?Precautions / Restrictions Precautions ?Precautions: Fall ?Restrictions ?Weight Bearing Restrictions: No  ? ?  ? ?Mobility ? Bed Mobility ?Overal bed mobility: Independent ?  ?  ?  ?  ?  ?  ?General bed mobility comments: HOB elevated, no assist or increased time ?  ? ?Transfers ?Overall transfer level: Needs assistance ?Equipment used: 1 person hand held assist ?Transfers: Sit to/from Stand ?Sit to Stand: Mod assist ?  ?  ?  ?  ?  ?General transfer comment: minA with bilateral LE braced on bed and posterior lean, would be modA without legs braced on bed ?  ? ?Ambulation/Gait ?Ambulation/Gait assistance: Min assist ?Gait Distance (Feet): 150 Feet ?Assistive device: None ?Gait Pattern/deviations: Step-through pattern, Decreased stride length, Staggering left ?Gait velocity: decreased ?Gait velocity interpretation: <1.31 ft/sec, indicative of household ambulator ?  ?General Gait Details: pt with narrow BOS and small stride. intermittent staggering steps to correct LOB and minA to correct ? ?  ? ?Balance Overall balance assessment: Needs assistance ?Sitting-balance support: No upper extremity supported ?Sitting balance-Leahy Scale: Good ?  ?Postural control: Posterior lean ?Standing balance support: No upper extremity supported ?Standing balance-Leahy Scale: Fair ?Standing balance comment: minA to steady with gait, unable to tolerate challenge ?  ?  ?  ?  ?  ?  ?  ?  ?  ?  ?  ?   ? ? ? ?  Pertinent Vitals/Pain Pain Assessment ?Pain Assessment: No/denies pain  ? ? ?Home Living Family/patient expects to be discharged to:: Private residence ?Living Arrangements: Alone ?Available Help at Discharge: Family;Available 24 hours/day ?Type of Home: House ?Home Access:  Stairs to enter ?Entrance Stairs-Rails: Right;Left;Can reach both ?Entrance Stairs-Number of Steps: 4 ?  ?Home Layout: One level ?Home Equipment: Conservation officer, nature (2 wheels);Cane - single point;Shower seat ?Additional Comments: pt plans to have assist from brother's wife  ?  ?Prior Function Prior Level of Function : Independent/Modified Independent;Working/employed;Driving ?  ?  ?  ?  ?  ?  ?Mobility Comments: pt reports gym 3x/week, working for [name]. had been living alone prior to initial admission ?ADLs Comments: pt reports independence ?  ? ? ?Hand Dominance  ? Dominant Hand: Right ? ?  ?Extremity/Trunk Assessment  ? Upper Extremity Assessment ?Upper Extremity Assessment: Overall WFL for tasks assessed ?  ? ?Lower Extremity Assessment ?Lower Extremity Assessment: Overall WFL for tasks assessed ?  ? ?Cervical / Trunk Assessment ?Cervical / Trunk Assessment: Kyphotic  ?Communication  ? Communication: HOH (with hearing aides)  ?Cognition Arousal/Alertness: Awake/alert ?Behavior During Therapy: [facility] for tasks assessed/performed ?Overall Cognitive Status: Within Functional Limits for tasks assessed ?  ?  ?  ?  ?  ?  ?  ?  ?  ?  ?  ?  ?  ?  ?  ?  ?  ?  ?  ? ?  ?General Comments General comments (skin integrity, edema, etc.): pt on 2L upon my arrival, SpO2 100%, maintained 92-96% on RA with gait ? ?  ?Exercises    ? ?Assessment/Plan  ?  ?PT Assessment Patient needs continued PT services  ?PT Problem List Decreased activity tolerance;Decreased balance;Decreased mobility ? ?   ?  ?PT Treatment Interventions DME instruction;Gait training;Stair training;Functional mobility training;Therapeutic activities;Therapeutic exercise;Balance training;Patient/family education   ? ?PT Goals (Current goals can be found in the Care Plan section)  ?Acute Rehab PT Goals ?Patient Stated Goal: return home and not need RW ?PT Goal Formulation: With patient ?Time For Goal Achievement: 03/14/22 ?Potential to Achieve Goals: Good ? ?  ?Frequency  Min 3X/week ?  ? ? ?   ?AM-PAC PT "6 Clicks" Mobility  ?Outcome Measure Help needed turning from your back to your side while in a flat bed without using bedrails?: None ?Help needed moving from lying on your back to sitting on the side of a flat bed without using bedrails?: A Little ?Help needed moving to and from a bed to a chair (including a wheelchair)?: A Little ?Help needed standing up from a chair using your arms (e.g., wheelchair or bedside chair)?: A Little ?Help needed to walk in hospital room?: A Little ?Help needed climbing 3-5 steps with a railing? : A Little ?6 Click Score: 19 ? ?  ?End of Session Equipment Utilized During Treatment: Gait belt ?Activity Tolerance: Patient tolerated treatment well ?Patient left: in bed;with call bell/phone within reach;with bed alarm set;with family/visitor present ?Nurse Communication: Mobility status ?PT Visit Diagnosis: Other abnormalities of gait and mobility (R26.89) ?  ? ?Time: 7672-0947 ?PT Time Calculation (min) (ACUTE ONLY): 21 min ? ? ?Charges:   PT Evaluation ?$PT Eval Low Complexity: 1 Low ?  ?  ?   ? ? ?West Carbo, PT, DPT  ? ?Acute Rehabilitation Department ?Pager #: [phone] - [phone] ? ?Sandra Cockayne ?02/28/2022, 5:36 PM ? ?

## 2022-02-28 NOTE — Hospital Course (Addendum)
Jesus [name] was admitted to the hospital with the working diagnosis of decompensated heart failure.  ? ?81 yo male with the past medical history of coronary artery disease sp CABG, angioplasty, AS sp TAVR, heart failure, hypertension, dyslipidemia and paroxysmal atrial fibrillation, who presented with dyspnea. Recent hospitalization for NSTEMI on 03/14 to 03/19. Reported 7 days of worsening dyspnea and lower extremity edema. He presented to the ED on 03/23, [place] lower extremities were negative for DVT and he was discharged home. At home his symptoms continue to worsening prompting him to come back to the ED. On his ED evaluation his blood pressure was 145/75, HR 74, RR 24 and 02 saturation 92%, lungs with no wheezing or rhonchi, positive bilateral rales at bases, heart with S1 and S2 present and rhythmic, positive systolic murmur, abdomen not distended and positive lower extremity edema.  ? ? Na 137, K 4.3 CL 111, bicarbonate 16, glucose 103, bun 29 cr 1,13 ?BNP 1,033 ?High sensitive troponin 37  ?Wbc 11.2 hgb 10.2, hct 31,8 plt 209  ?INR 2,9  ?Sars covid 19 negative.  ? ?Chest radiograph with cardiomegaly, positive bilateral hilar vascular congestion, left base atelectasis.  ? ?EKG 59 bpm, normal axis, 1st degree AV block with interventricular conduction delay, sinus rhythm with no significant ST segment or T wave changes, LVH.  ? ?Patient was placed on furosemide for diuresis with good toleration. ?Pending renal function to be more stable before discharge.  ?

## 2022-03-01 ENCOUNTER — Other Ambulatory Visit (HOSPITAL_COMMUNITY): Payer: Self-pay

## 2022-03-01 DIAGNOSIS — I480 Paroxysmal atrial fibrillation: Secondary | ICD-10-CM | POA: Diagnosis not present

## 2022-03-01 DIAGNOSIS — N189 Chronic kidney disease, unspecified: Secondary | ICD-10-CM

## 2022-03-01 DIAGNOSIS — I5023 Acute on chronic systolic (congestive) heart failure: Secondary | ICD-10-CM | POA: Diagnosis not present

## 2022-03-01 DIAGNOSIS — N179 Acute kidney failure, unspecified: Secondary | ICD-10-CM | POA: Diagnosis not present

## 2022-03-01 DIAGNOSIS — I25810 Atherosclerosis of coronary artery bypass graft(s) without angina pectoris: Secondary | ICD-10-CM | POA: Diagnosis not present

## 2022-03-01 LAB — BASIC METABOLIC PANEL
Anion gap: 10 (ref 5–15)
BUN: 30 mg/dL — ABNORMAL HIGH (ref 8–23)
CO2: 25 mmol/L (ref 22–32)
Calcium: 8.4 mg/dL — ABNORMAL LOW (ref 8.9–10.3)
Chloride: 105 mmol/L (ref 98–111)
Creatinine, Ser: 1.52 mg/dL — ABNORMAL HIGH (ref 0.61–1.24)
GFR, Estimated: 45 mL/min — ABNORMAL LOW (ref >60)
Glucose, Bld: 100 mg/dL — ABNORMAL HIGH (ref 70–99)
Potassium: 3.5 mmol/L (ref 3.5–5.1)
Sodium: 140 mmol/L (ref 135–145)

## 2022-03-01 LAB — CBC
HCT: 32.5 % — ABNORMAL LOW (ref 39.0–52.0)
Hemoglobin: 10.9 g/dL — ABNORMAL LOW (ref 13.0–17.0)
MCH: 32.9 pg (ref 26.0–34.0)
MCHC: 33.5 g/dL (ref 30.0–36.0)
MCV: 98.2 fL (ref 80.0–100.0)
Platelets: 192 10*3/uL (ref 150–400)
RBC: 3.31 MIL/uL — ABNORMAL LOW (ref 4.22–5.81)
RDW: 12.9 % (ref 11.5–15.5)
WBC: 10 10*3/uL (ref 4.0–10.5)
nRBC: 0 % (ref 0.0–0.2)

## 2022-03-01 LAB — PROTIME-INR
INR: 3.3 — ABNORMAL HIGH (ref 0.8–1.2)
Prothrombin Time: 33.2 seconds — ABNORMAL HIGH (ref 11.4–15.2)

## 2022-03-01 NOTE — Assessment & Plan Note (Signed)
CKD stage 2. ? ?Renal function with serum cr at 1,52 with K at 3,5 and serum bicarbonate at 25. ?Hold on further diuresis and hold on losartan. ?Continue with spironolactone and SGLT2i ?Follow up renal function in am, avoid hypotension and nephrotoxic medications.  ?

## 2022-03-01 NOTE — Progress Notes (Addendum)
Physical Therapy Treatment ?Patient Details ?Name: Jesus [name] ?MRN: [id number] ?DOB: 1941/11/06 ?Today's Date: 03/01/2022 ? ? ?History of Present Illness The pt is an 81 yo male presenting 3/26 with SOB. Pt recently here for bilateral LE edema on 3/23 and DVT was ruled out so he was d/c home on 3/24. This admission pt found to have pulmonary edema and bilateral pleural effusions. PMH includes: CAD s/p CABG, NSTEMI 02/20/22 s/p cardiac cath with stent placement, ischemic cardiomyopathy, afib on coumadin, aortic stenosis s/p TAVR 2016, HLD, and HTN. ? ?  ?PT Comments  ? ? Pt received in supine, eager to progress OOB mobility and with good participation. Pt limited due to symptomatic orthostatic hypotension and c/o LLE tingling (in foot and knee) and discomfort, pt having trouble achieving LLE terminal knee extension seated EOB, which he reports is new. Pt needing increased assist, up to [facility] for sit<>stand and and pre-gait activities at bedside with RW support. Defer hallway gait trial due to pt evolving lightheadedness, RN/MD notified. Pt sister in law and niece present and supportive. Pt continues to benefit from PT services to progress toward functional mobility goals. Disposition updated below per discussion with pt/supervising PT Chrys Racer B, pt very motivated to return to [facility] and likely to achieve modified independence with short term high intensity post-acute rehab. ?Orthostatic BP ?Supine 92/51 (64) 76 HR  ?Sitting 102/66 (78)  ?Standing 87/72 (78)  ?Standing after 3 min 85/46 (56)  ?Seated after standing 95/53 (67)  ?Supine 103/59 (73)  ?    ?Recommendations for follow up therapy are one component of a multi-disciplinary discharge planning process, led by the attending physician.  Recommendations may be updated based on patient status, additional functional criteria and insurance authorization. ? ?Follow Up Recommendations ? Acute inpatient rehab (3hours/day) ?  ?  ?Assistance Recommended at Discharge  Intermittent Supervision/Assistance  ?Patient can return home with the following A little help with bathing/dressing/bathroom;Assist for transportation;Help with stairs or ramp for entrance;A lot of help with walking and/or transfers ?  ?Equipment Recommendations ? BSC/3in1  ?  ?Recommendations for Other Services   ? ? ?  ?Precautions / Restrictions Precautions ?Precautions: Fall ?Precaution Comments: orthostatic hypotension ?Restrictions ?Weight Bearing Restrictions: No  ?  ? ?Mobility ? Bed Mobility ?Overal bed mobility: Modified Independent ?  ?  ?  ?  ?  ?  ?General bed mobility comments: HOB elevated, no physical assist given ?  ? ?Transfers ?Overall transfer level: Needs assistance ?Equipment used: Rolling walker (2 wheels) ?Transfers: Sit to/from Stand ?Sit to Stand: Mod assist ?  ?  ?  ?  ?  ?General transfer comment: from EOB>RW, pt with posterior bias and needs increased time to perform and for anterior weight shift. ?  ? ?Ambulation/Gait ?Ambulation/Gait assistance: Min assist, Mod assist ?Gait Distance (Feet): 4 Feet ?Assistive device: Rolling walker (2 wheels) ?Gait Pattern/deviations: Decreased stride length, Staggering left, Staggering right, Knee flexed in stance - left ?  ?  ?Pre-gait activities: standing hip flexion x10 reps and forward/backward stepping at bedside with min/modA and RW support, pt with LLE discomfort and needing min/modA for balance due to LOB with backward stepping x2 occasions. Defer hallway ambulation due to pt evolving lightheadedness and pt found to be orthostatic, RN notified ?  ? ? ?Stairs ?  ?  ?  ?  ?  ? ? ?Wheelchair Mobility ?  ? ?Modified Rankin (Stroke Patients Only) ?  ? ? ?  ?Balance Overall balance assessment: Needs assistance ?Sitting-balance support: No  upper extremity supported ?Sitting balance-Leahy Scale: Good ?  ?Postural control: Posterior lean ?Standing balance support: No upper extremity supported ?Standing balance-Leahy Scale: Poor ?Standing balance  comment: min guard for standing with U UE support, needs min/modA with RW for pre-gait forward/backward steps at bedside ?  ?  ?  ?  ?  ?  ?  ?  ?  ?  ?  ?  ? ?  ?Cognition Arousal/Alertness: Awake/alert ?Behavior During Therapy: [facility] for tasks assessed/performed ?Overall Cognitive Status: Within Functional Limits for tasks assessed ?  ?  ?  ?  ?  ?  ?  ?  ?  ?  ?  ?  ?  ?  ?  ?  ?General Comments: pt HoH, sometimes nods but then appears to not know what therapist asked. Pt motivated to progress back to PLOF and return to work. ?  ?  ? ?  ?Exercises Other Exercises ?Other Exercises: seated BLE AROM: ankle pumps, LAQ, hip flexion x10 reps ea ?Other Exercises: supine BLE AROM: hip abduction x10 reps ea ?Other Exercises: standing hip flexion x10 reps ea ?Other Exercises: BUE AROM: chest press x10 reps in supine prior to BP check ? ?  ?General Comments General comments (skin integrity, edema, etc.): SpO2 WFL on RA; see orthostatic vitals in comments above ?  ?  ? ?Pertinent Vitals/Pain Pain Assessment ?Pain Assessment: Faces ?Faces Pain Scale: Hurts little more ?Pain Location: L foot, L knee resting and with weight bearing ?Pain Descriptors / Indicators: Tingling, Discomfort ?Pain Intervention(s): Limited activity within patient's tolerance, Monitored during session, Repositioned  ? ? ?   ?   ? ?PT Goals (current goals can now be found in the care plan section) Acute Rehab PT Goals ?Patient Stated Goal: return home and not need RW ?PT Goal Formulation: With patient ?Time For Goal Achievement: 03/14/22 ?Progress towards PT goals: Progressing toward goals ? ?  ?Frequency ? ? ? Min 3X/week ? ? ? ?  ?PT Plan Discharge plan needs to be updated  ? ? ?   ?AM-PAC PT "6 Clicks" Mobility   ?Outcome Measure ? Help needed turning from your back to your side while in a flat bed without using bedrails?: None ?Help needed moving from lying on your back to sitting on the side of a flat bed without using bedrails?: A Little ?Help needed  moving to and from a bed to a chair (including a wheelchair)?: A Little ?Help needed standing up from a chair using your arms (e.g., wheelchair or bedside chair)?: A Lot ?Help needed to walk in hospital room?: A Lot ?Help needed climbing 3-5 steps with a railing? : A Lot ?6 Click Score: 16 ? ?  ?End of Session Equipment Utilized During Treatment: Gait belt ?Activity Tolerance: Treatment limited secondary to medical complications (Comment) (orthostatic hypotension and symptoms limiting standing tolerance) ?Patient left: in bed;with call bell/phone within reach;with bed alarm set;with family/visitor present ?Nurse Communication: Mobility status;Other (comment) (LLE tingling, sx orthostatic hypotension) ?PT Visit Diagnosis: Other abnormalities of gait and mobility (R26.89) ?  ? ? ?Time: 5374-8270 ?PT Time Calculation (min) (ACUTE ONLY): 28 min ? ?Charges:  $Therapeutic Exercise: 8-22 mins ?$Therapeutic Activity: 8-22 mins          ?          ? ?Tonna Palazzi P., PTA ?Acute Rehabilitation Services ?Pager: [phone] ?Office: [phone]  ? ? ?Kara Pacer Lizandra Zakrzewski ?03/01/2022, 1:48 PM ? ?

## 2022-03-01 NOTE — Care Management Important Message (Signed)
Important Message ? ?Patient Details  ?Name: Jesus [name] ?MRN: [id number] ?Date of Birth: 02/09/1941 ? ? ?Medicare Important Message Given:  Yes ? ? ? ? ?Christal Lagerstrom ?03/01/2022, 1:40 PM ?

## 2022-03-01 NOTE — Progress Notes (Signed)
ANTICOAGULATION CONSULT NOTE - Follow Up Consult ? ?Pharmacy Consult for Warfarin ?Indication: TAVR / PAF ? ?Allergies  ?Allergen Reactions  ? Codeine Other (See Comments)  ?  Pt does not remember  ? Iodine Swelling  ? ? ?Patient Measurements: ?Height: '5\' 1"'$  (154.9 cm) ?Weight: 55.4 kg (122 lb 2.2 oz) ?IBW/kg (Calculated) : 52.3 ? ?Vital Signs: ?Temp: 98.3 ?F (36.8 ?C) (03/28 0800) ?Temp Source: Oral (03/28 0800) ?BP: 113/58 (03/28 0800) ?Pulse Rate: 95 (03/28 1013) ? ?Labs: ?Recent Labs  ?  02/27/22 ?1822 02/28/22 ?0058 03/01/22 ?0034  ?HGB 10.2* 8.6* 10.9*  ?HCT 31.8* 25.1* 32.5*  ?PLT 209 198 192  ?LABPROT 30.3* 30.0* 33.2*  ?INR 2.9* 2.9* 3.3*  ?CREATININE 1.13 1.32* 1.52*  ?TROPONINIHS 48*  37*  --   --   ? ? ? ?Estimated Creatinine Clearance: 27.2 mL/min (A) (by C-G formula based on SCr of 1.52 mg/dL (H)). ? ? ?Assessment: ?On warfarin PTA - > 4 mg daily  ?INR trending up above goal to 3.3. Hemoglobin is stable 10.9  No bleeding issues noted.  ? ?Goal of Therapy:  ?INR 2-3 ?Monitor platelets by anticoagulation protocol: Yes ?  ?Plan:  ?Will hold warfarin today to allow INR to decrease ?Daily INR for now ? ?Thank you, ? ?Erin Hearing PharmD., BCPS ?Clinical Pharmacist ?03/01/2022 10:17 AM ? ?

## 2022-03-01 NOTE — Consult Note (Addendum)
? ?  [facility] CM Inpatient Consult ? ? ?03/01/2022 ? ?Jesus [name] ?08/19/1941 ?[id number] ? ?[facility] [[facility]]  Accountable Care Organization [ACO] Patient: [employer] Medicare ? ?Primary Care Provider:  [name], Bruce W, MD, [facility], is an embedded provider with a Chronic Care Management team and program, and is listed for the transition of care follow up and appointments. ? ?Patient was screened for Embedded practice service needs for chronic care management reveals patient is active in the CCM program with the Embedded pharmacist and Embedded LCSW. ? ?Addendum:  Met with patient and wife [another visitor at the bedside] to follow up on post hospital needs,  patient is soft spoken, explained potential follow up at MD office for post hospital transition. Gave an appointment card for reminder for PCP follow up and a 24 hour nurse advise line magnet. ? ?Plan: Will follow for needs and update the Embedded Chronic Care Management team of admission and for post hospital needs. ? ?Please contact for further questions, ? ?Victoria Brewer, RN BSN CCM ?[facility] Hospital Liaison ? [phone] business mobile phone ?Toll free office [phone]  ?Fax number: [phone] ?[email or web address] ?[email or web address] ? ? ? ?

## 2022-03-01 NOTE — Plan of Care (Signed)
Nutrition Education Note ? ?RD consulted for nutrition education regarding CHF. ? ?81 year old male who presented to the ED on 3/26 with SOB. PMH of CAD s/p CABG in 2017, NSTEMI s/p cardiac cath revealing severe multivessel disease s/p DES to the SVG, ischemic cardiomyopathy, atrial fibrillation, aortic stenosis s/p TAVR in 2016, HLD, HTN, GERD. Pt admitted with acute on chronic CHF, acute cardiogenic pulmonary edema. ? ?Spoke with pt, sister-in-law, and niece at bedside. Pt reports following a low sodium diet but "not the same diet they [pt's doctor's] want me to follow." Pt reports that until the last week, he has been living alone. He typically consumes cereal for breakfast. Lunch and dinner include either a fast food cheeseburger, a frozen dinner, or a salad. ? ?RD provided "Low Sodium Nutrition Therapy" handout from the Academy of [employer]. Reviewed patient's dietary recall. Provided examples on ways to decrease sodium intake in diet. Discouraged intake of processed foods and use of salt shaker. Encouraged fresh fruits and vegetables as well as whole grain sources of carbohydrates to maximize fiber intake.  ? ?RD discussed why it is important for patient to adhere to diet recommendations, and emphasized the role of fluids, foods to avoid, and importance of weighing self daily. Teach back method used. ? ?Expect good compliance. ? ?Current diet order is Heart Healthy, patient is consuming approximately 75% of meals at this time. Labs and medications reviewed. No further nutrition interventions warranted at this time. If additional nutrition issues arise, please re-consult RD.  ? ? ?Gustavus Bryant, MS, RD, LDN ?Inpatient Clinical Dietitian ?Please see [facility] for contact information. ? ?

## 2022-03-01 NOTE — Progress Notes (Addendum)
?Progress Note ? ? ?Patient: Jesus [name] IRW:[id number] DOB: [birth year] DOA: 02/27/2022     2 ?DOS: the patient was seen and examined on 03/01/2022 ?  ?Brief hospital course: ?Jesus [name] was admitted to the hospital with the working diagnosis of decompensated heart failure.  ? ?81 yo male with the past medical history of coronary artery disease sp CABG, angioplasty, AS sp TAVR, heart failure, hypertension, dyslipidemia and paroxysmal atrial fibrillation, who presented with dyspnea. Recent hospitalization for NSTEMI on 03/14 to 03/19. Reported 7 days of worsening dyspnea and lower extremity edema. He presented to the ED on 03/23, [place] lower extremities were negative for DVT and he was discharged home. At home his symptoms continue to worsening prompting him to come back to the ED. On his ED evaluation his blood pressure was 145/75, HR 74, RR 24 and 02 saturation 92%, lungs with no wheezing or rhonchi, positive bilateral rales at bases, heart with S1 and S2 present and rhythmic, positive systolic murmur, abdomen not distended and positive lower extremity edema.  ? ? Na 137, K 4.3 CL 111, bicarbonate 16, glucose 103, bun 29 cr 1,13 ?BNP 1,033 ?High sensitive troponin 37  ?Wbc 11.2 hgb 10.2, hct 31,8 plt 209  ?INR 2,9  ?Sars covid 19 negative.  ? ?Chest radiograph with cardiomegaly, positive bilateral hilar vascular congestion, left base atelectasis.  ? ?EKG 59 bpm, normal axis, 1st degree AV block with interventricular conduction delay, sinus rhythm with no significant ST segment or T wave changes, LVH.  ? ?Patient was placed on furosemide for diuresis with good toleration. ?Pending renal function to be more stable before discharge.  ? ?Assessment and Plan: ?* Acute on chronic systolic congestive heart failure ([place]) ?Acute hypoxemic respiratory failure due to acute cardiogenic pulmonary edema.  ?Elevated troponin due to heart failure exacerbation not acute coronary syndrome.  ? ?Urine output 2,400 cc over last 24  hrs ?Blood pressure 94 to 761 systolic.  ? ? ?Echocardiogram with reduced LV systolic function with EF 35 to 40%, with inferior and inferior lateral hypokinesis. Preserved RV systolic function. Left atrium with severe dilatation, no significant valvular disease.  (SP TAVR).  ? ?Patient with orthostatic hypotension. ?Plan to stop amlodipine, losartan and hold on further diuresis.  ?Continue with metoprolol, spironolactone and empagliflozin.  ?Close monitoring of blood pressure.  ? ? ?Coronary artery disease involving coronary bypass graft of native heart without angina pectoris ?No chest pain, troponin elevation due to heart failure decompensation.  ?Continue clopidogrel.  ? ? ?Acute kidney injury superimposed on chronic kidney disease ([place]) ?CKD stage 2. ? ?Renal function with serum cr at 1,52 with K at 3,5 and serum bicarbonate at 25. ?Hold on further diuresis and hold on losartan. ?Continue with spironolactone and SGLT2i ?Follow up renal function in am, avoid hypotension and nephrotoxic medications.  ? ?Paroxysmal atrial fibrillation ([place]) ?History of non sustained ventricular tachycardia.   ? ?Improved volume status, continue rate control for atrial fibrillation. ?Anticoagulation with warfarin, INR is 3.3 ? ? ? ?Essential hypertension ?Patient with orthostatic hypotension. ?DC amlodipine, losartan and hold on diuresis, continue blood pressure monitoring. ?On  spironolactone and metoprolol.   ? ? ?Mixed hyperlipidemia ?Continue with statin therapy.  ? ? ?Iron deficiency anemia ?Iron panel with serum iron at 15, tibc 242, transferrin saturation 6, consistent with iron deficiency. ? ?Continue with IV iron supplementation.  ?Resolved reactive leukocytosis.  ? ? ?GERD without esophagitis ?Continue with pantoprazole with good toleration.  ? ? ? ? ?  ? ?  Subjective: patient is feeling better, no dyspnea or chest pain, continue to be very weak and deconditioned.  ? ?Physical Exam: ?Vitals:  ? 03/01/22 0400 03/01/22 0800  03/01/22 1013 03/01/22 1300  ?BP: (!) 129/54 (!) 113/58    ?Pulse: 100 95 95   ?Resp: 17 19    ?Temp: 98.2 ?F (36.8 ?C) 98.3 ?F (36.8 ?C)    ?TempSrc: Oral Oral    ?SpO2: 92% 92%  93%  ?Weight: 55.4 kg     ?Height:      ? ?Neurology awake and alert ?ENT with mild pallor ?Cardiovascular with S1 and S2 present and rhythmic with no gallops or murmurs, no rubs ?No JVD ?No lower extremity edema ?Respiratory with no rales or wheezing ?Abdomen not distended ?Positive scoliosis and kyphosis.  ?Data Reviewed: ? ? ? ?Family Communication: I spoke with patient's sister in law and nice at the bedside, we talked in detail about patient's condition, plan of care and prognosis and all questions were addressed. ? ? ?Disposition: ?Status is: Inpatient ?Remains inpatient appropriate because: renal failure  ? Planned Discharge Destination: Home/ possible inpatient rehab.  ? ? ? ?Author: ?Tawni Millers, MD ?03/01/2022 2:38 PM ? ?For on call review [email or web address].  ?

## 2022-03-01 NOTE — Progress Notes (Signed)
Heart Failure Stewardship Pharmacist Progress Note ? ? ?PCP: [name], MD ?PCP-Cardiologist: [name], MD  ? ? ?HPI:  ?81 yo M with PMH of CAD s/p CABG, CHF, afib, aortic stenosis s/p TAVR, HLD, and HTN. He presented to the ED on 3/26 with shortness of breath and LE edema. CXR with possible mild interstitial edema and small bilateral pleural effusions. His last ECHO from 3/15 with LVEF 35-40% and G2DD.  ? ?Current HF Medications: ?Beta Blocker: metoprolol tartrate 25 mg BID ?ACE/ARB/ARNI: losartan 50 mg daily ?Aldosterone Antagonist: spironolactone 12.5 mg daily ?SGLT2i: Jardiance 10 mg daily ? ?Prior to admission HF Medications: ?Beta blocker: metoprolol tartrate 25 mg BID ?ACE/ARB/ARNI: losartan 50 mg daily ? ?Pertinent Lab Values: ?Serum creatinine 1.52, BUN 30, Potassium 3.5, Sodium 140, BNP 1033.5, Magnesium 1.9, A1c 5.9 ? ?Vital Signs: ?Weight: 122 lbs (admission weight: 132 lbs) ?Blood pressure: 80/70-110/60s  ?Heart rate: 60-80s  ?I/O: -2.3L yesterday; net -4.1L ? ?Medication Assistance / Insurance Benefits Check: ?Does the patient have prescription insurance?  Yes ?Type of insurance plan: [facility] Medicare ? ?Outpatient Pharmacy:  ?Prior to admission outpatient [facility] ?Is the patient willing to use [facility] at discharge? Yes ?Is the patient willing to transition their outpatient pharmacy to utilize a [facility] outpatient pharmacy?   Pending ?  ? ?Assessment: ?1. Acute on chronic systolic CHF (EF 78-93%), due to ICM. NYHA class III symptoms. ?- No IV diuretics ?- On metoprolol tartrate 25 mg BID - consider switching to XL since he has reduced LVEF ?- Continue losartan 50 mg daily - consider optimizing to Entresto 24/26 mg BID with reduced LVEF ?- Continue spironolactone 12.5 mg daily. BP today prevents from optimizing to 25 mg daily. ? - Continue Jardiacne 10 mg daily ?Plan: ?1) Medication changes recommended at this time: ?- Change metoprolol tartrate to metoprolol XL 50 mg ? ?2)  Patient assistance: ?- Entresto copay $47 ?- Jardiance copay $47 ? ?3)  Education  ?- To be completed prior to discharge ? ?Kerby Nora, PharmD, BCPS ?Heart Failure Stewardship Pharmacist ?Phone [phone] ? ? ?

## 2022-03-02 DIAGNOSIS — I5023 Acute on chronic systolic (congestive) heart failure: Secondary | ICD-10-CM | POA: Diagnosis not present

## 2022-03-02 LAB — BASIC METABOLIC PANEL
Anion gap: 9 (ref 5–15)
BUN: 29 mg/dL — ABNORMAL HIGH (ref 8–23)
CO2: 25 mmol/L (ref 22–32)
Calcium: 8.5 mg/dL — ABNORMAL LOW (ref 8.9–10.3)
Chloride: 106 mmol/L (ref 98–111)
Creatinine, Ser: 1.51 mg/dL — ABNORMAL HIGH (ref 0.61–1.24)
GFR, Estimated: 46 mL/min — ABNORMAL LOW (ref >60)
Glucose, Bld: 110 mg/dL — ABNORMAL HIGH (ref 70–99)
Potassium: 3.6 mmol/L (ref 3.5–5.1)
Sodium: 140 mmol/L (ref 135–145)

## 2022-03-02 LAB — PROTIME-INR
INR: 3.2 — ABNORMAL HIGH (ref 0.8–1.2)
Prothrombin Time: 33 seconds — ABNORMAL HIGH (ref 11.4–15.2)

## 2022-03-02 MED ORDER — HYDROCODONE-ACETAMINOPHEN 5-325 MG PO TABS
1.0000 | ORAL_TABLET | Freq: Four times a day (QID) | ORAL | Status: DC | PRN
  Administered 2022-03-02: 1 via ORAL
  Filled 2022-03-02: qty 1

## 2022-03-02 NOTE — Progress Notes (Signed)
Inpatient Rehab Admissions Coordinator:  ? ?Met with patient and family at bedside to discuss CIR recommendations and goals/expectations of CIR stay.  We reviewed 3 hrs/day of therapy, physiatry f/u, and average length of stay 2 weeks (dependent on progress).  We also reviewed likely need for initial 24/7 at discharge and that [facility] Medicare will not typically cover a SNF admit from CIR.  Finally, we discussed potential need to do expedited appeal with [facility] Medicare.  Pt and family in agreement with plan to pursue CIR with support from sister and 2 nieces at discharge.  I will start insurance auth request today.  ? ?Shann Medal, PT, DPT ?Admissions Coordinator ?[phone] ?03/02/22  ?3:47 PM ? ?

## 2022-03-02 NOTE — Progress Notes (Signed)
?PROGRESS NOTE ? ? ? ?Jesus [name]  WCH:[id number] DOB: 28-Dec-1940 DOA: 02/27/2022 ?PCP: [name], MD  ?Narrative [name] with history of CAD, CABG, AS, TAVR, chronic systolic CHF, hypertension, paroxysmal A-fib on Coumadin was recently hospitalized with NSTEMI from 3/14-19, cath noted severe native vessel CAD s/p 6V CABG with 6/6 patent grafts, severe stenosis in the proximal body of the SVG to the PDA-Successful PTCA/DES x 1 proximal body of SVG to PDA. ?-Presented to the ED 3/26 with increase dyspnea on exertion and lower extremity edema, chest x-ray noted pleural effusion, pulmonary edema, BNP 1033 ? ?Subjective: ?-Feels better, breathing is improving, some aches and pains in his legs, had orthostatic hypotension yesterday ? ?Assessment & Plan: ? ?Acute on chronic systolic congestive heart failure ([place]) ?Acute hypoxic respiratory failure ?-Recent echo 3/15 echocardiogram with reduced LV systolic function with EF 35 to 40%, with inferior and inferior lateral hypokinesis. Preserved RV systolic function. Left atrium with severe dilatation, no significant valvular disease.  (SP TAVR).  ?-Diuresed with IV Lasix he is 3 L negative ?-Further diuretics held with worsening AKI and orthostatic hypotension ?-Continue metoprolol, spironolactone, empagliflozin ?-Monitor I's/O, BMP in a.m. ? ?Coronary artery disease involving coronary bypass graft of native heart without angina pectoris ?-Minimally elevated high-sensitivity troponin in the setting of CHF ?-Recent PTCA/stenting of SVG to PDA ?-Continue Plavix, metoprolol, statin, warfarin ? ?Acute kidney injury superimposed on chronic kidney disease ([place]) ?CKD stage 2. ?-Creatinine gradually worsened to 1.5 ?-Holding Lasix and ARB ?-Continue Aldactone, SGLT2i, avoid hypotension ?-BP stable today ? ?Paroxysmal atrial fibrillation (HCC) ?History of non sustained ventricular tachycardia.   ?-Anticoagulation with warfarin, INR is 3.3 ? ?Essential hypertension ?-Developed  orthostatic hypotension yesterday, amlodipine and losartan discontinued ?-Continue  spironolactone and metoprolol.   ? ?Iron deficiency anemia ?Iron panel with serum iron at 15, tibc 242, transferrin saturation 6, consistent with iron deficiency. ?-Received IV iron supplementation.  ? ?GERD without esophagitis ?Continue with pantoprazole with good toleration.  ? ?DVT prophylaxis: Warfarin ?Code Status: DNR ?Family Communication: Discussed patient in detail, no family at bedside ?Disposition Plan: Home in 1 to 2 days ?Consultants:  ? ? ?Procedures:  ? ?Antimicrobials:  ? ? ?Objective: ?Vitals:  ? 03/02/22 0402 03/02/22 0500 03/02/22 0723 03/02/22 1121  ?BP: 115/68  127/72 (!) 105/55  ?Pulse: 92  100 84  ?Resp: (!) '22  18 18  '$ ?Temp: 98.4 ?F (36.9 ?C)  98.3 ?F (36.8 ?C) 97.9 ?F (36.6 ?C)  ?TempSrc: Oral  Oral Oral  ?SpO2: 92%  95% 91%  ?Weight:  57.2 kg    ?Height:      ? ? ?Intake/Output Summary (Last 24 hours) at 03/02/2022 1239 ?Last data filed at 03/01/2022 1400 ?Gross per 24 hour  ?Intake 240 ml  ?Output --  ?Net 240 ml  ? ?Filed Weights  ? 02/28/22 0300 03/01/22 0400 03/02/22 0500  ?Weight: 57.9 kg 55.4 kg 57.2 kg  ? ? ?Examination: ? ?General exam: Thinly built elderly male sitting up in bed, AAOx3, no distress ?HEENT: No JVD ?CVS: S1-S2, regular rate rhythm ?Lungs: Clear bilaterally ?Abdomen: Soft, nontender, bowel sounds present ?Extremities: No edema ?Skin: No rashes ?Psychiatry: Judgement and insight appear normal. Mood & affect appropriate.  ? ? ? ?Data Reviewed:  ? ?CBC: ?Recent Labs  ?Lab 02/27/22 ?1822 02/28/22 ?0058 03/01/22 ?0034  ?WBC 11.2* 13.3* 10.0  ?NEUTROABS 8.8* 11.0*  --   ?HGB 10.2* 8.6* 10.9*  ?HCT 31.8* 25.1* 32.5*  ?MCV 103.6* 100.0 98.2  ?PLT 209 198 192  ? ?  Basic Metabolic Panel: ?Recent Labs  ?Lab 02/23/22 ?1607 02/27/22 ?1822 02/28/22 ?0058 03/01/22 ?7106 03/02/22 ?0121  ?NA 139 137 139 140 140  ?K 4.1 4.3 3.6 3.5 3.6  ?CL 107 111 109 105 106  ?CO2 24 16* 20* 25 25  ?GLUCOSE 102* 103*  114* 100* 110*  ?BUN 35* 29* 27* 30* 29*  ?CREATININE 1.39 1.13 1.32* 1.52* 1.51*  ?CALCIUM 8.5 8.1* 8.3* 8.4* 8.5*  ?MG  --   --  1.9  --   --   ? ?GFR: ?Estimated Creatinine Clearance: 27.4 mL/min (A) (by C-G formula based on SCr of 1.51 mg/dL (H)). ?Liver Function Tests: ?Recent Labs  ?Lab 02/28/22 ?2694  ?AST 35  ?ALT 46*  ?ALKPHOS 79  ?BILITOT 0.9  ?PROT 6.0*  ?ALBUMIN 3.0*  ? ?No results for input(s): LIPASE, AMYLASE in the last 168 hours. ?No results for input(s): AMMONIA in the last 168 hours. ?Coagulation Profile: ?Recent Labs  ?Lab 02/27/22 ?1822 02/28/22 ?0058 03/01/22 ?8546 03/02/22 ?0121  ?INR 2.9* 2.9* 3.3* 3.2*  ? ?Cardiac Enzymes: ?No results for input(s): CKTOTAL, CKMB, CKMBINDEX, TROPONINI in the last 168 hours. ?BNP (last 3 results) ?No results for input(s): PROBNP in the last 8760 hours. ?HbA1C: ?No results for input(s): HGBA1C in the last 72 hours. ?CBG: ?No results for input(s): GLUCAP in the last 168 hours. ?Lipid Profile: ?No results for input(s): CHOL, HDL, LDLCALC, TRIG, CHOLHDL, LDLDIRECT in the last 72 hours. ?Thyroid Function Tests: ?No results for input(s): TSH, T4TOTAL, FREET4, T3FREE, THYROIDAB in the last 72 hours. ?Anemia Panel: ?Recent Labs  ?  02/28/22 ?0058  ?VITAMINB12 620  ?FOLATE 63.6  ?TIBC 242*  ?IRON 15*  ? ?Urine analysis: ?   ?Component Value Date/Time  ? COLORURINE YELLOW 12/02/2020 1154  ? APPEARANCEUR CLEAR 12/02/2020 1154  ? LABSPEC 1.006 12/02/2020 1154  ? PHURINE 7.0 12/02/2020 1154  ? GLUCOSEU NEGATIVE 12/02/2020 1154  ? [place] NEGATIVE 01/29/2010 1351  ? [place] NEGATIVE 12/02/2020 1154  ? [place] NEGATIVE 12/02/2020 1154  ? BILIRUBINUR n 08/27/2014 1657  ? [place] NEGATIVE 12/02/2020 1154  ? PROTEINUR NEGATIVE 12/02/2020 1154  ? UROBILINOGEN 1.0 03/06/2015 1446  ? NITRITE NEGATIVE 12/02/2020 1154  ? LEUKOCYTESUR NEGATIVE 12/02/2020 1154  ? ?Sepsis Labs: ?'@LABRCNTIP'$ (procalcitonin:4,lacticidven:4) ? ?) ?Recent Results (from the past 240 hour(s))  ?Resp Panel  by RT-PCR (Flu A&B, Covid) Nasopharyngeal Swab     Status: None  ? Collection Time: 02/27/22  6:51 PM  ? Specimen: Nasopharyngeal Swab; Nasopharyngeal(NP) swabs in vial transport medium  ?Result Value Ref Range Status  ? SARS Coronavirus 2 by RT PCR NEGATIVE NEGATIVE Final  ?  Comment: (NOTE) ?SARS-CoV-2 target nucleic acids are NOT DETECTED. ? ?The SARS-CoV-2 RNA is generally detectable in upper respiratory ?specimens during the acute phase of infection. The lowest ?concentration of SARS-CoV-2 viral copies this assay can detect is ?138 copies/mL. A negative result does not preclude SARS-Cov-2 ?infection and should not be used as the sole basis for treatment or ?other patient management decisions. A negative result may occur with  ?improper specimen collection/handling, submission of specimen other ?than nasopharyngeal swab, presence of viral mutation(s) within the ?areas targeted by this assay, and inadequate number of viral ?copies(<138 copies/mL). A negative result must be combined with ?clinical observations, patient history, and epidemiological ?information. The expected result is Negative. ? ?Fact Sheet for Patients:  ?EntrepreneurPulse.com.au ? ?Fact Sheet for Healthcare Providers:  ?IncredibleEmployment.be ? ?This test is no t yet approved or cleared by the [place] and  ?  has been authorized for detection and/or diagnosis of SARS-CoV-2 by ?FDA under an Emergency Use Authorization (EUA). This EUA will remain  ?in effect (meaning this test can be used) for the duration of the ?COVID-19 declaration under Section 564(b)(1) of the Act, 21 ?U.S.C.section 360bbb-3(b)(1), unless the authorization is terminated  ?or revoked sooner.  ? ? ?  ? Influenza A by PCR NEGATIVE NEGATIVE Final  ? Influenza B by PCR NEGATIVE NEGATIVE Final  ?  Comment: (NOTE) ?The Xpert Xpress SARS-CoV-2/FLU/RSV plus assay is intended as an aid ?in the diagnosis of influenza from Nasopharyngeal swab  specimens and ?should not be used as a sole basis for treatment. Nasal washings and ?aspirates are unacceptable for Xpert Xpress SARS-CoV-2/FLU/RSV ?testing. ? ?Fact Sheet for Patients: ?https://www.fda.go

## 2022-03-02 NOTE — Progress Notes (Signed)
Mobility Specialist Progress Note  ? ? 03/02/22 1521  ?Mobility  ?Activity Ambulated with assistance in hallway  ?Level of Assistance Minimal assist, patient does 75% or more  ?Assistive Device Front wheel walker  ?Distance Ambulated (ft) 205 ft  ?Activity Response Tolerated well  ?$Mobility charge 1 Mobility  ? ?Pre-Mobility: 79 HR, 113/65 BP, 92% SpO2 ?Post-Mobility: 86 HR, 94% SpO2 ? ?BP lying: 113/65 ?BP sitting: 112/55 ?BP standing 0 minutes: 101/58 ? ?Pt received in bed and agreeable. No complaints. Returned to bed with call bell in reach and family present.  ? ?Hildred Alamin ?Mobility Specialist  ?  ?

## 2022-03-02 NOTE — Progress Notes (Signed)
ANTICOAGULATION CONSULT NOTE - Follow Up Consult ? ?Pharmacy Consult for Warfarin ?Indication: TAVR / PAF ? ?Allergies  ?Allergen Reactions  ? Codeine Other (See Comments)  ?  Pt does not remember  ? Iodine Swelling  ? ? ?Patient Measurements: ?Height: '5\' 1"'$  (154.9 cm) ?Weight: 57.2 kg (126 lb) ?IBW/kg (Calculated) : 52.3 ? ?Vital Signs: ?Temp: 98.3 ?F (36.8 ?C) (03/29 4193) ?Temp Source: Oral (03/29 7902) ?BP: 127/72 (03/29 0723) ?Pulse Rate: 100 (03/29 0723) ? ?Labs: ?Recent Labs  ?  02/27/22 ?1822 02/28/22 ?0058 03/01/22 ?4097 03/02/22 ?0121  ?HGB 10.2* 8.6* 10.9*  --   ?HCT 31.8* 25.1* 32.5*  --   ?PLT 209 198 192  --   ?LABPROT 30.3* 30.0* 33.2* 33.0*  ?INR 2.9* 2.9* 3.3* 3.2*  ?CREATININE 1.13 1.32* 1.52* 1.51*  ?TROPONINIHS 48*  37*  --   --   --   ? ? ? ?Estimated Creatinine Clearance: 27.4 mL/min (A) (by C-G formula based on SCr of 1.51 mg/dL (H)). ? ? ?Assessment: ?On warfarin PTA - > 4 mg daily  ?INR stable overnight but still above goal at 3.2. No cbc today. No bleeding issues noted.  ? ?Goal of Therapy:  ?INR 2-3 ?Monitor platelets by anticoagulation protocol: Yes ?  ?Plan:  ?Will hold warfarin one more day to allow INR to decrease ?Daily INR for now ? ?Thank you, ? ?Erin Hearing PharmD., BCPS ?Clinical Pharmacist ?03/02/2022 9:44 AM ? ?

## 2022-03-02 NOTE — Progress Notes (Addendum)
Heart Failure Stewardship Pharmacist Progress Note ? ? ?PCP: [name], MD ?PCP-Cardiologist: [name], MD  ? ? ?HPI:  ?81 yo M with PMH of CAD s/p CABG, CHF, afib, aortic stenosis s/p TAVR, HLD, and HTN. He presented to the ED on 3/26 with shortness of breath and LE edema. CXR with possible mild interstitial edema and small bilateral pleural effusions. His last ECHO from 3/15 with LVEF 35-40% and G2DD.  ? ?Current HF Medications: ?Beta Blocker: metoprolol tartrate 25 mg BID ?Aldosterone Antagonist: spironolactone 12.5 mg daily ?SGLT2i: Jardiance 10 mg daily ? ?Prior to admission HF Medications: ?Beta blocker: metoprolol tartrate 25 mg BID ?ACE/ARB/ARNI: losartan 50 mg daily ? ?Pertinent Lab Values: ?Serum creatinine 1.51, BUN 29, Potassium 3.6, Sodium 140, BNP 1033.5, Magnesium 1.9, A1c 5.9 ?TSAT 6 ? ?Vital Signs: ?Weight: 126 lbs (admission weight: 132 lbs) ?Blood pressure: 120/60s  ?Heart rate: 80-100s  ?I/O: -1.4L yesterday; net -2.8L ? ?Medication Assistance / Insurance Benefits Check: ?Does the patient have prescription insurance?  Yes ?Type of insurance plan: [facility] Medicare ? ?Outpatient Pharmacy:  ?Prior to admission outpatient [facility] ?Is the patient willing to use [facility] at discharge? Yes ?Is the patient willing to transition their outpatient pharmacy to utilize a [facility] outpatient pharmacy?   Pending ?  ? ?Assessment: ?1. Acute on chronic systolic CHF (EF 68-34%), due to ICM. NYHA class III symptoms. ?- No IV diuretics ?- On metoprolol tartrate 25 mg BID - consider switching to XL since he has reduced LVEF ?- Losartan on hold with orthostatic hypotension ?- Continue spironolactone 12.5 mg daily ? - Continue Jardiance 10 mg daily ?- Agree with stopping amlodipine with reduced LVEF ?- IV iron x 1 given on 3/28 ? ?Plan: ?1) Medication changes recommended at this time: ?- Change metoprolol tartrate to metoprolol XL 50 mg ? ?2) Patient assistance: ?- Entresto copay $47 ?-  Jardiance copay $47 ? ?3)  Education  ?- To be completed prior to discharge ? ?Kerby Nora, PharmD, BCPS ?Heart Failure Stewardship Pharmacist ?Phone [phone] ? ? ?

## 2022-03-02 NOTE — Evaluation (Signed)
Occupational Therapy Evaluation ?Patient Details ?Name: Jesus [name] ?MRN: [id number] ?DOB: 1941/01/22 ?Today's Date: 03/02/2022 ? ? ?History of Present Illness The pt is an 81 yo male presenting 3/26 with SOB. Pt recently here for bilateral LE edema on 3/23 and DVT was ruled out so he was d/c home on 3/24. This admission pt found to have pulmonary edema and bilateral pleural effusions. PMH includes: CAD s/p CABG, NSTEMI 02/20/22 s/p cardiac cath with stent placement, ischemic cardiomyopathy, afib on coumadin, aortic stenosis s/p TAVR 2016, HLD, and HTN.  ? ?Clinical Impression ?  ?Pt admitted with above. He demonstrates the below listed deficits and will benefit from continued OT to maximize safety and independence with BADLs.  Pt presents to OT with generalized weakness, decreased activity tolerance, impaired balance, and increased pain.  He currently requires set up assist for UB ADLs and min - mod A for LB ADLs.  He does randomly loose his balance requiring up to mod A to recover.  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  Feel he would benefit from AIR to maximize his safety and independence with ADLs and reduce risk of falls/injury.  Will follow.  ?  ?   ? ?Recommendations for follow up therapy are one component of a multi-disciplinary discharge planning process, led by the attending physician.  Recommendations may be updated based on patient status, additional functional criteria and insurance authorization.  ? ?Follow Up Recommendations ? Acute inpatient rehab (3hours/day)  ?  ?Assistance Recommended at Discharge Intermittent Supervision/Assistance  ?Patient can return home with the following A little help with walking and/or transfers;A little help with bathing/dressing/bathroom;Assist for transportation;Help with stairs or ramp for entrance;Assistance with cooking/housework ? ?   ?Functional Status Assessment ? Patient has had a recent decline in their functional status and demonstrates the ability to make significant improvements in function in a reasonable and predictable amount of time.  ?Equipment Recommendations ? None recommended by OT  ?  ?Recommendations for Other Services   ? ? ?  ?Precautions / Restrictions Precautions ?Precautions: Fall  ? ?  ? ?Mobility Bed Mobility ?Overal bed mobility: Modified Independent ?  ?  ?  ?  ?  ?  ?  ?  ? ?Transfers ?  ?  ?  ?  ?  ?  ?  ?  ?  ?  ?  ? ?  ?Balance Overall balance assessment: Needs assistance ?Sitting-balance support: No upper extremity supported ?Sitting balance-Leahy Scale: Good ?  ?  ?Standing balance support: No upper extremity supported ?Standing balance-Leahy Scale: Poor ?Standing balance comment: close min guard assist to occasional min A ?  ?  ?  ?  ?  ?  ?  ?  ?  ?  ?  ?   ? ?ADL either performed or assessed with clinical judgement  ? ?ADL Overall ADL's : Needs assistance/impaired ?Eating/Feeding: Independent ?  ?Grooming: Wash/dry hands;Wash/dry face;Oral care;Brushing hair;Min guard;Standing ?  ?Upper Body Bathing: Set up;Sitting ?  ?Lower Body Bathing: Minimal assistance;Sit to/from stand ?  ?Upper Body Dressing : Set up;Sitting ?  ?Lower Body Dressing: Minimal assistance;Sit to/from stand ?  ?Toilet Transfer: Moderate assistance;Ambulation;Comfort height toilet ?Toilet Transfer Details (indicate cue type and reason): pt with significant LOB when ambulating to and from BR requiring mod A to recover ?Toileting- Clothing Manipulation and Hygiene: Minimal assistance;Sit to/from stand ?  ?  ?  ?Functional  mobility during ADLs: Moderate assistance ?   ? ? ? ?Vision Patient Visual Report: No change from baseline ?   ?   ?Perception   ?  ?Praxis   ?  ? ?Pertinent Vitals/Pain Pain Assessment ?Pain Assessment: Faces ?Faces Pain Scale: Hurts little more ?Pain Location: Lt foot ?Pain Descriptors / Indicators: Tingling, Discomfort ?Pain  Intervention(s): Monitored during session  ? ? ? ?Hand Dominance Right ?  ?Extremity/Trunk Assessment Upper Extremity Assessment ?Upper Extremity Assessment: Generalized weakness (grossly 4/5) ?  ?Lower Extremity Assessment ?Lower Extremity Assessment: Defer to PT evaluation ?  ?Cervical / Trunk Assessment ?Cervical / Trunk Assessment: Kyphotic ?  ?Communication Communication ?Communication: HOH ?  ?Cognition Arousal/Alertness: Awake/alert ?Behavior During Therapy: [facility] for tasks assessed/performed ?Overall Cognitive Status: Within Functional Limits for tasks assessed ?  ?  ?  ?  ?  ?  ?  ?  ?  ?  ?  ?  ?  ?  ?  ?  ?General Comments: pt HOH ?  ?  ?General Comments  VSS throughout session ? ?  ?Exercises   ?  ?Shoulder Instructions    ? ? ?Home Living Family/patient expects to be discharged to:: Private residence ?Living Arrangements: Alone ?Available Help at Discharge: Family;Available 24 hours/day ?Type of Home: House ?Home Access: Stairs to enter ?Entrance Stairs-Number of Steps: 4 ?Entrance Stairs-Rails: Right;Left;Can reach both ?Home Layout: One level ?  ?  ?Bathroom Shower/Tub: Walk-in shower ?  ?Bathroom Toilet: Standard ?  ?  ?Home Equipment: Conservation officer, nature (2 wheels);Cane - single point;Shower seat ?  ?Additional Comments: pt plans to have assist from brother's wife. Pt's wife passed June 2022 ?  ? ?  ?Prior Functioning/Environment Prior Level of Function : Independent/Modified Independent;Working/employed;Driving ?  ?  ?  ?  ?  ?  ?Mobility Comments: pt reports gym 3x/week, working for [name]. had been living alone prior to initial admission ?ADLs Comments: pt reports independence.  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  He mows grass on riding lawnmower, manages his yard work independently, drives, grocery shops ?  ? ?  ?  ?OT Problem List: Decreased strength;Decreased activity tolerance;Impaired balance (sitting and/or standing);Decreased knowledge of use of DME or AE;Pain ?  ?   ?OT  Treatment/Interventions: Self-care/ADL training;Therapeutic exercise;DME and/or AE instruction;Therapeutic activities;Patient/family education;Balance training  ?  ?OT Goals(Current goals can be found in the care plan section) Acute Rehab OT Goals ?Patient Stated Goal: to get strong enough to go back to work ?OT Goal Formulation: With patient ?Time For Goal Achievement: 03/16/22 ?Potential to Achieve Goals: Good ?ADL Goals ?Pt Will Perform Grooming: with modified independence;standing ?Pt Will Perform Upper Body Bathing: with supervision ?Pt Will Perform Lower Body Bathing: with supervision;sit to/from stand ?Pt Will Perform Upper Body Dressing: with supervision;sitting ?Pt Will Perform Lower Body Dressing: with supervision;sit to/from stand ?Pt Will Transfer to Toilet: with supervision;ambulating;regular height toilet ?Pt Will Perform Toileting - Clothing Manipulation and hygiene: with supervision;sit to/from stand ?Pt Will Perform Tub/Shower Transfer: Shower transfer;with supervision;ambulating;shower seat ?Pt/caregiver will Perform Home Exercise Program: Increased ROM;Both right and left upper extremity;Independently;With theraband;With written HEP provided  ?OT Frequency: Min 2X/week ?  ? ?Co-evaluation   ?  ?  ?  ?  ? ?  ?AM-PAC OT "6 Clicks" Daily Activity     ?Outcome Measure Help from another person eating meals?: None ?Help from another person taking care of personal grooming?: A Little ?Help from another person toileting, which includes using toliet, bedpan, or urinal?:  A Lot ?Help from another person bathing (including washing, rinsing, drying)?: A Little ?Help from another person to put on and taking off regular upper body clothing?: A Little ?Help from another person to put on and taking off regular lower body clothing?: A Little ?6 Click Score: 18 ?  ?End of Session Nurse Communication: Mobility status ? ?Activity Tolerance: Patient tolerated treatment well ?Patient left: in bed;with call bell/phone  within reach;with bed alarm set ? ?OT Visit Diagnosis: Unsteadiness on feet (R26.81)  ?              ?Time: 8144-8185 ?OT Time Calculation (min): 25 min ?Charges:  OT General Charges ?$OT Visit: 1 Visit ?OT [place]

## 2022-03-03 ENCOUNTER — Other Ambulatory Visit (HOSPITAL_COMMUNITY): Payer: Self-pay

## 2022-03-03 DIAGNOSIS — I5023 Acute on chronic systolic (congestive) heart failure: Secondary | ICD-10-CM | POA: Diagnosis not present

## 2022-03-03 LAB — CBC
HCT: 30.9 % — ABNORMAL LOW (ref 39.0–52.0)
Hemoglobin: 10.7 g/dL — ABNORMAL LOW (ref 13.0–17.0)
MCH: 34 pg (ref 26.0–34.0)
MCHC: 34.6 g/dL (ref 30.0–36.0)
MCV: 98.1 fL (ref 80.0–100.0)
Platelets: 217 10*3/uL (ref 150–400)
RBC: 3.15 MIL/uL — ABNORMAL LOW (ref 4.22–5.81)
RDW: 13 % (ref 11.5–15.5)
WBC: 12.6 10*3/uL — ABNORMAL HIGH (ref 4.0–10.5)
nRBC: 0 % (ref 0.0–0.2)

## 2022-03-03 LAB — BASIC METABOLIC PANEL
Anion gap: 10 (ref 5–15)
BUN: 26 mg/dL — ABNORMAL HIGH (ref 8–23)
CO2: 23 mmol/L (ref 22–32)
Calcium: 8.3 mg/dL — ABNORMAL LOW (ref 8.9–10.3)
Chloride: 106 mmol/L (ref 98–111)
Creatinine, Ser: 1.41 mg/dL — ABNORMAL HIGH (ref 0.61–1.24)
GFR, Estimated: 49 mL/min — ABNORMAL LOW (ref >60)
Glucose, Bld: 112 mg/dL — ABNORMAL HIGH (ref 70–99)
Potassium: 4.1 mmol/L (ref 3.5–5.1)
Sodium: 139 mmol/L (ref 135–145)

## 2022-03-03 LAB — PROTIME-INR
INR: 3.1 — ABNORMAL HIGH (ref 0.8–1.2)
Prothrombin Time: 32 seconds — ABNORMAL HIGH (ref 11.4–15.2)

## 2022-03-03 MED ORDER — METOPROLOL SUCCINATE ER 25 MG PO TB24
25.0000 mg | ORAL_TABLET | Freq: Every day | ORAL | Status: DC
  Administered 2022-03-03 – 2022-03-08 (×6): 25 mg via ORAL
  Filled 2022-03-03 (×6): qty 1

## 2022-03-03 MED ORDER — METOPROLOL SUCCINATE ER 25 MG PO TB24
25.0000 mg | ORAL_TABLET | Freq: Every day | ORAL | 0 refills | Status: DC
  Filled 2022-03-03: qty 30, 30d supply, fill #0

## 2022-03-03 MED ORDER — SPIRONOLACTONE 25 MG PO TABS
25.0000 mg | ORAL_TABLET | Freq: Every day | ORAL | 0 refills | Status: DC
  Filled 2022-03-03: qty 30, 30d supply, fill #0

## 2022-03-03 MED ORDER — EMPAGLIFLOZIN 10 MG PO TABS
10.0000 mg | ORAL_TABLET | Freq: Every day | ORAL | 0 refills | Status: DC
  Filled 2022-03-03: qty 14, 14d supply, fill #0

## 2022-03-03 MED ORDER — WARFARIN SODIUM 2 MG PO TABS
2.0000 mg | ORAL_TABLET | Freq: Once | ORAL | Status: AC
  Administered 2022-03-03: 2 mg via ORAL
  Filled 2022-03-03: qty 1

## 2022-03-03 NOTE — Plan of Care (Signed)

## 2022-03-03 NOTE — Progress Notes (Signed)
?PROGRESS NOTE ? ? ? ?Jesus [name]  FBP:[id number] DOB: September 11, 1941 DOA: 02/27/2022 ?PCP: [name], MD  ?Narrative [name] with history of CAD, CABG, AS, TAVR, chronic systolic CHF, hypertension, paroxysmal A-fib on Coumadin was recently hospitalized with NSTEMI from 3/14-19, cath noted severe native vessel CAD s/p 6V CABG with 6/6 patent grafts, severe stenosis in the proximal body of the SVG to the PDA-Successful PTCA/DES x 1 proximal body of SVG to PDA. ?-Presented to the ED 3/26 with increase dyspnea on exertion and lower extremity edema, chest x-ray noted pleural effusion, pulmonary edema, BNP 1033 ? ?Subjective: ?-Feels better overall, no dizziness today, breathing has improved, leg pains have improved ? ?Assessment & Plan: ? ?Acute on chronic systolic congestive heart failure ([place]) ?Acute hypoxic respiratory failure ?-Recent echo 3/15 echocardiogram with reduced LV systolic function with EF 35 to 40%, with inferior and inferior lateral hypokinesis. Preserved RV systolic function. Left atrium with severe dilatation, no significant valvular disease.  (SP TAVR).  ?-Diuresed with IV Lasix, he is 3.4 L negative ?-Further diuretics held with worsening AKI and orthostatic hypotension ?-BP soft but improving, continue Toprol, spironolactone, empagliflozin ?-Creatinine improving, discharge planning, plan for CIR noted ?-Discussed sodium restriction ? ?Coronary artery disease involving coronary bypass graft of native heart without angina pectoris ?-Minimally elevated high-sensitivity troponin in the setting of CHF ?-Recent PTCA/stenting of SVG to PDA ?-Continue Plavix, metoprolol, statin, warfarin ? ?Acute kidney injury superimposed on chronic kidney disease ([place]) ?CKD stage 2. ?-Creatinine gradually worsened to 1.5, improving ?-Holding Lasix and ARB ?-Continue Aldactone, SGLT2i, avoid hypotension ?-BP soft overnight, stable now ? ?Paroxysmal atrial fibrillation ([facility]) ?History of non sustained ventricular  tachycardia.   ?-Anticoagulation with warfarin, INR is 3.3 ? ?Essential hypertension ?-Developed orthostatic hypotension 3/28, amlodipine and losartan discontinued ?-Continue  spironolactone and metoprolol.   ? ?Iron deficiency anemia ?Iron panel with serum iron at 15, tibc 242, transferrin saturation 6, consistent with iron deficiency. ?-Received IV iron supplementation.  ? ?GERD without esophagitis ?Continue with pantoprazole with good toleration.  ? ?DVT prophylaxis: Warfarin ?Code Status: DNR ?Family Communication: Discussed patient in detail, no family at bedside ?Disposition Plan: CIR when bed available ?Consultants:  ? ? ?Procedures:  ? ?Antimicrobials:  ? ? ?Objective: ?Vitals:  ? 03/03/22 0032 03/03/22 0307 03/03/22 0747 03/03/22 1100  ?BP: (!) 107/54 116/67 117/81 119/71  ?Pulse: 90 91 95 77  ?Resp: '20 19 20 20  '$ ?Temp: 99.3 ?F (37.4 ?C) 99.7 ?F (37.6 ?C) 97.6 ?F (36.4 ?C) 97.6 ?F (36.4 ?C)  ?TempSrc: Oral Oral Oral Oral  ?SpO2: 94% 96% 90% 92%  ?Weight:  55.7 kg    ?Height:      ? ? ?Intake/Output Summary (Last 24 hours) at 03/03/2022 1241 ?Last data filed at 03/03/2022 0831 ?Gross per 24 hour  ?Intake 200 ml  ?Output 650 ml  ?Net -450 ml  ? ?Filed Weights  ? 03/01/22 0400 03/02/22 0500 03/03/22 0307  ?Weight: 55.4 kg 57.2 kg 55.7 kg  ? ? ?Examination: ? ?General exam: Elderly thinly built male sitting up in bed, AAOx3, no distress ?HEENT: No JVD ?CVS: S1-S2, regular rate rhythm ?Lungs: Clear bilaterally ?Abdomen: Soft, nontender, bowel sounds present  ?Extremities: No edema ?Skin: No rashes ?Psychiatry: Judgement and insight appear normal. Mood & affect appropriate.  ? ? ? ?Data Reviewed:  ? ?CBC: ?Recent Labs  ?Lab 02/27/22 ?1822 02/28/22 ?0058 03/01/22 ?0034 03/03/22 ?0050  ?WBC 11.2* 13.3* 10.0 12.6*  ?NEUTROABS 8.8* 11.0*  --   --   ?HGB  10.2* 8.6* 10.9* 10.7*  ?HCT 31.8* 25.1* 32.5* 30.9*  ?MCV 103.6* 100.0 98.2 98.1  ?PLT 209 198 192 217  ? ?Basic Metabolic Panel: ?Recent Labs  ?Lab 02/27/22 ?1822  02/28/22 ?0058 03/01/22 ?0034 03/02/22 ?0121 03/03/22 ?0050  ?NA 137 139 140 140 139  ?K 4.3 3.6 3.5 3.6 4.1  ?CL 111 109 105 106 106  ?CO2 16* 20* '25 25 23  '$ ?GLUCOSE 103* 114* 100* 110* 112*  ?BUN 29* 27* 30* 29* 26*  ?CREATININE 1.13 1.32* 1.52* 1.51* 1.41*  ?CALCIUM 8.1* 8.3* 8.4* 8.5* 8.3*  ?MG  --  1.9  --   --   --   ? ?GFR: ?Estimated Creatinine Clearance: 29.4 mL/min (A) (by C-G formula based on SCr of 1.41 mg/dL (H)). ?Liver Function Tests: ?Recent Labs  ?Lab 02/28/22 ?1324  ?AST 35  ?ALT 46*  ?ALKPHOS 79  ?BILITOT 0.9  ?PROT 6.0*  ?ALBUMIN 3.0*  ? ?No results for input(s): LIPASE, AMYLASE in the last 168 hours. ?No results for input(s): AMMONIA in the last 168 hours. ?Coagulation Profile: ?Recent Labs  ?Lab 02/27/22 ?1822 02/28/22 ?0058 03/01/22 ?0034 03/02/22 ?0121 03/03/22 ?0050  ?INR 2.9* 2.9* 3.3* 3.2* 3.1*  ? ?Cardiac Enzymes: ?No results for input(s): CKTOTAL, CKMB, CKMBINDEX, TROPONINI in the last 168 hours. ?BNP (last 3 results) ?No results for input(s): PROBNP in the last 8760 hours. ?HbA1C: ?No results for input(s): HGBA1C in the last 72 hours. ?CBG: ?No results for input(s): GLUCAP in the last 168 hours. ?Lipid Profile: ?No results for input(s): CHOL, HDL, LDLCALC, TRIG, CHOLHDL, LDLDIRECT in the last 72 hours. ?Thyroid Function Tests: ?No results for input(s): TSH, T4TOTAL, FREET4, T3FREE, THYROIDAB in the last 72 hours. ?Anemia Panel: ?No results for input(s): VITAMINB12, FOLATE, FERRITIN, TIBC, IRON, RETICCTPCT in the last 72 hours. ? ?Urine analysis: ?   ?Component Value Date/Time  ? COLORURINE YELLOW 12/02/2020 1154  ? APPEARANCEUR CLEAR 12/02/2020 1154  ? LABSPEC 1.006 12/02/2020 1154  ? PHURINE 7.0 12/02/2020 1154  ? GLUCOSEU NEGATIVE 12/02/2020 1154  ? [place] NEGATIVE 01/29/2010 1351  ? [place] NEGATIVE 12/02/2020 1154  ? [place] NEGATIVE 12/02/2020 1154  ? BILIRUBINUR n 08/27/2014 1657  ? [place] NEGATIVE 12/02/2020 1154  ? PROTEINUR NEGATIVE 12/02/2020 1154  ? UROBILINOGEN 1.0  03/06/2015 1446  ? NITRITE NEGATIVE 12/02/2020 1154  ? LEUKOCYTESUR NEGATIVE 12/02/2020 1154  ? ?Sepsis Labs: ?'@LABRCNTIP'$ (procalcitonin:4,lacticidven:4) ? ?) ?Recent Results (from the past 240 hour(s))  ?Resp Panel by RT-PCR (Flu A&B, Covid) Nasopharyngeal Swab     Status: None  ? Collection Time: 02/27/22  6:51 PM  ? Specimen: Nasopharyngeal Swab; Nasopharyngeal(NP) swabs in vial transport medium  ?Result Value Ref Range Status  ? SARS Coronavirus 2 by RT PCR NEGATIVE NEGATIVE Final  ?  Comment: (NOTE) ?SARS-CoV-2 target nucleic acids are NOT DETECTED. ? ?The SARS-CoV-2 RNA is generally detectable in upper respiratory ?specimens during the acute phase of infection. The lowest ?concentration of SARS-CoV-2 viral copies this assay can detect is ?138 copies/mL. A negative result does not preclude SARS-Cov-2 ?infection and should not be used as the sole basis for treatment or ?other patient management decisions. A negative result may occur with  ?improper specimen collection/handling, submission of specimen other ?than nasopharyngeal swab, presence of viral mutation(s) within the ?areas targeted by this assay, and inadequate number of viral ?copies(<138 copies/mL). A negative result must be combined with ?clinical observations, patient history, and epidemiological ?information. The expected result is Negative. ? ?Fact Sheet for Patients:  ?EntrepreneurPulse.com.au ? ?Fact Sheet  for Healthcare Providers:  ?IncredibleEmployment.be ? ?This test is no t yet approved or cleared by the [place] FDA and  ?has been authorized for detection and/or diagnosis of SARS-CoV-2 by ?FDA under an Emergency Use Authorization (EUA). This EUA will remain  ?in effect (meaning this test can be used) for the duration of the ?COVID-19 declaration under Section 564(b)(1) of the Act, 21 ?U.S.C.section 360bbb-3(b)(1), unless the authorization is terminated  ?or revoked sooner.  ? ? ?  ? Influenza A by PCR  NEGATIVE NEGATIVE Final  ? Influenza B by PCR NEGATIVE NEGATIVE Final  ?  Comment: (NOTE) ?The Xpert Xpress SARS-CoV-2/FLU/RSV plus assay is intended as an aid ?in the diagnosis of influenza from Nasopharyngeal sw

## 2022-03-03 NOTE — Progress Notes (Signed)
ANTICOAGULATION CONSULT NOTE - Follow Up Consult ? ?Pharmacy Consult for Warfarin ?Indication: TAVR / PAF ? ?Allergies  ?Allergen Reactions  ? Codeine Other (See Comments)  ?  Pt does not remember  ? Iodine Swelling  ? ? ?Patient Measurements: ?Height: '5\' 1"'$  (154.9 cm) ?Weight: 55.7 kg (122 lb 12.8 oz) ?IBW/kg (Calculated) : 52.3 ? ?Vital Signs: ?Temp: 97.6 ?F (36.4 ?C) (03/30 0747) ?Temp Source: Oral (03/30 0747) ?BP: 117/81 (03/30 0747) ?Pulse Rate: 95 (03/30 0747) ? ?Labs: ?Recent Labs  ?  03/01/22 ?0034 03/02/22 ?0121 03/03/22 ?0050  ?HGB 10.9*  --  10.7*  ?HCT 32.5*  --  30.9*  ?PLT 192  --  217  ?LABPROT 33.2* 33.0* 32.0*  ?INR 3.3* 3.2* 3.1*  ?CREATININE 1.52* 1.51* 1.41*  ? ? ? ?Estimated Creatinine Clearance: 29.4 mL/min (A) (by C-G formula based on SCr of 1.41 mg/dL (H)). ? ? ?Assessment: ?On warfarin PTA for Afib- > 4 mg daily  ?INR remains slightly Supra-therapeutic at 3.1. CBC stable today. No bleeding issues noted.  ?Warfarin held 3/28 and 3/29 to allow INR to drift down. ? ?Goal of Therapy:  ?INR 2-3 ?Monitor platelets by anticoagulation protocol: Yes ?  ?Plan:  ?Give warfarin 2 mg po x 1 ?Monitor daily INR, CBC, clinical course, s/sx of bleed, PO intake/diet, Drug-Drug Interactions ? ? ? ?Thank you for allowing [place] to participate in this patients care. ?Jens Som, PharmD ?03/03/2022 8:49 AM ? ?**Pharmacist phone directory can be found on [email or web address] listed under [place]** ? ? ?

## 2022-03-03 NOTE — Progress Notes (Signed)
Physical Therapy Treatment ?Patient Details ?Name: Jesus [name] ?MRN: [id number] ?DOB: 1941-05-04 ?Today's Date: 03/03/2022 ? ? ?History of Present Illness The pt is an 81 yo male presenting 3/26 with SOB. Pt recently here for bilateral LE edema on 3/23 and DVT was ruled out so he was d/c home on 3/24. This admission pt found to have pulmonary edema and bilateral pleural effusions. PMH includes: CAD s/p CABG, NSTEMI 02/20/22 s/p cardiac cath with stent placement, ischemic cardiomyopathy, afib on coumadin, aortic stenosis s/p TAVR 2016, HLD, and HTN. ? ?  ?PT Comments  ? ? Pt is making good progress with mobility, but remains limited in stability and L lower extremity weight bearing secondary to L knee pain. Pt is able to ambulate without LOB at a min guard assist level when using the RW, but displays LOB bouts needing minA when ambulating without UE support. Educated pt to use RW at this time until his stability improves and to have someone guard him on the stairs or when getting on/off his seated lawn mower. Pt verbalized understanding. Still feel pt would greatly benefit from intensive therapy at the AIR setting due to his significant decline in functional status and desire to return to work. However, if pt decides to go home, then recommend he d/c home with his sister-in-law and with HHPT. Will continue to follow acutely. ?   ?Recommendations for follow up therapy are one component of a multi-disciplinary discharge planning process, led by the attending physician.  Recommendations may be updated based on patient status, additional functional criteria and insurance authorization. ? ?Follow Up Recommendations ? Acute inpatient rehab (3hours/day) (HHPT if pt decides to go home) ?  ?  ?Assistance Recommended at Discharge Intermittent Supervision/Assistance  ?Patient can return home with the following A little help with bathing/dressing/bathroom;Assist for transportation;Help with stairs or ramp for entrance;A lot of  help with walking and/or transfers;Assistance with cooking/housework ?  ?Equipment Recommendations ? None recommended by PT  ?  ?Recommendations for Other Services   ? ? ?  ?Precautions / Restrictions Precautions ?Precautions: Fall ?Precaution Comments: orthostatic hypotension ?Restrictions ?Weight Bearing Restrictions: No  ?  ? ?Mobility ? Bed Mobility ?  ?  ?  ?  ?  ?  ?  ?General bed mobility comments: Pt sitting EOB upon arrival. ?  ? ?Transfers ?Overall transfer level: Needs assistance ?Equipment used: None ?Transfers: Sit to/from Stand ?Sit to Stand: Min guard ?  ?  ?  ?  ?  ?General transfer comment: Extra time and trunk sway noted, but no LOB, min guard for safety ?  ? ?Ambulation/Gait ?Ambulation/Gait assistance: Min assist, Min guard ?Gait Distance (Feet): 320 Feet ?Assistive device: Rolling walker (2 wheels), None ?Gait Pattern/deviations: Decreased stride length, Knee flexed in stance - left, Decreased stance time - left, Step-through pattern, Trunk flexed ?Gait velocity: decreased ?Gait velocity interpretation: <1.31 ft/sec, indicative of household ambulator ?  ?General Gait Details: Pt with slightly forward flexed posture and decreased L stance time with reports of L knee pain. Min guard with no LOB when using RW, but minA with x2 LOB bouts when ambulating without UE support. Pt with increased difficulty advancing legs and lateral staggering, especially when scanning surroundings when ambulating without RW. Educated pt to use RW at d/c for safety at this time and of his risk for falls. Pt verbalized understanding. ? ? ?Stairs ?  ?  ?  ?  ?  ? ? ?Wheelchair Mobility ?  ? ?Modified Rankin (Stroke Patients Only) ?  ? ? ?  ?  Balance Overall balance assessment: Needs assistance ?Sitting-balance support: No upper extremity supported ?Sitting balance-Leahy Scale: Good ?  ?  ?Standing balance support: No upper extremity supported, Bilateral upper extremity supported, During functional activity ?Standing  balance-Leahy Scale: Fair ?Standing balance comment: Able to ambulate without UE support but with LOB needing minA to recover at times. Min guard for safety when using RW with no LOB ?  ?  ?  ?  ?  ?  ?  ?  ?  ?  ?  ?  ? ?  ?Cognition Arousal/Alertness: Awake/alert ?Behavior During Therapy: [facility] for tasks assessed/performed ?Overall Cognitive Status: Within Functional Limits for tasks assessed ?  ?  ?  ?  ?  ?  ?  ?  ?  ?  ?  ?  ?  ?  ?  ?  ?General Comments: pt HoH, sometimes nods but then appears to not know what therapist asked. Pt motivated to progress back to PLOF and return to work. ?  ?  ? ?  ?Exercises   ? ?  ?General Comments General comments (skin integrity, edema, etc.): BP 114/66 sitting, 117/69 standing, 135/112 sitting end of session; educated pt on using RW for now and to have someone guard him with stairs or getting on/off seated lawn mower and to hold onto supports for safety ?  ?  ? ?Pertinent Vitals/Pain Pain Assessment ?Pain Assessment: Faces ?Faces Pain Scale: Hurts a little bit ?Pain Location: L knee ?Pain Descriptors / Indicators: Discomfort, Guarding ?Pain Intervention(s): Limited activity within patient's tolerance, Monitored during session, Repositioned  ? ? ?Home Living   ?  ?  ?  ?  ?  ?  ?  ?  ?  ?   ?  ?Prior Function    ?  ?  ?   ? ?PT Goals (current goals can now be found in the care plan section) Acute Rehab PT Goals ?Patient Stated Goal: to go back to work ?PT Goal Formulation: With patient ?Time For Goal Achievement: 03/14/22 ?Potential to Achieve Goals: Good ?Progress towards PT goals: Progressing toward goals ? ?  ?Frequency ? ? ? Min 3X/week ? ? ? ?  ?PT Plan Discharge plan needs to be updated;Equipment recommendations need to be updated  ? ? ?Co-evaluation   ?  ?  ?  ?  ? ?  ?AM-PAC PT "6 Clicks" Mobility   ?Outcome Measure ? Help needed turning from your back to your side while in a flat bed without using bedrails?: None ?Help needed moving from lying on your back to sitting on  the side of a flat bed without using bedrails?: A Little ?Help needed moving to and from a bed to a chair (including a wheelchair)?: A Little ?Help needed standing up from a chair using your arms (e.g., wheelchair or bedside chair)?: A Little ?Help needed to walk in hospital room?: A Little ?Help needed climbing 3-5 steps with a railing? : A Little ?6 Click Score: 19 ? ?  ?End of Session Equipment Utilized During Treatment: Gait belt ?Activity Tolerance: Patient tolerated treatment well ?Patient left: in bed;with call bell/phone within reach ?Nurse Communication: Mobility status ?PT Visit Diagnosis: Other abnormalities of gait and mobility (R26.89);Unsteadiness on feet (R26.81);Difficulty in walking, not elsewhere classified (R26.2) ?  ? ? ?Time: 0953-1010 ?PT Time Calculation (min) (ACUTE ONLY): 17 min ? ?Charges:  $Gait Training: 8-22 mins          ?          ? ?  Jesus [name], PT, Jesus [name] ?Acute Rehabilitation Services  ?Pager: [phone] ?Office: [phone] ? ? ? ?Jesus [name] ?03/03/2022, 10:23 AM ? ?

## 2022-03-03 NOTE — Progress Notes (Signed)
Heart Failure Stewardship Pharmacist Progress Note ? ? ?PCP: [name], MD ?PCP-Cardiologist: [name], MD  ? ? ?HPI:  ?81 yo M with PMH of CAD s/p CABG, CHF, afib, aortic stenosis s/p TAVR, HLD, and HTN. He presented to the ED on 3/26 with shortness of breath and LE edema. CXR with possible mild interstitial edema and small bilateral pleural effusions. His last ECHO from 3/15 with LVEF 35-40% and G2DD.  ? ?Current HF Medications: ?Beta Blocker: metoprolol XL 25 mg daily ?Aldosterone Antagonist: spironolactone 12.5 mg daily ?SGLT2i: Jardiance 10 mg daily ? ?Prior to admission HF Medications: ?Beta blocker: metoprolol tartrate 25 mg BID ?ACE/ARB/ARNI: losartan 50 mg daily ? ?Pertinent Lab Values: ?Serum creatinine 1.41, BUN 26, Potassium 4.1, Sodium 139, BNP 1033.5, A1c 5.9 ?TSAT 6 ? ?Vital Signs: ?Weight: 122 lbs (admission weight: 132 lbs) ?Blood pressure: 80-110/60s  ?Heart rate: 80-90s  ?I/O: -629m yesterday; net -3.2L ? ?Medication Assistance / Insurance Benefits Check: ?Does the patient have prescription insurance?  Yes ?[facility] plan[facility] ? ?Outpatient Pharmacy:  ?[facility] ?Is the patient willing to use [facility]? Yes ?Is the patient willing [facility]?   Pending ?  ? ?Assessment: ?1. Acute on chronic systolic CHF (EF 362-37%, due to ICM. NYHA class III symptoms. ?- No IV diuretics ?- Agree with switching to metoprolol XL 25 mg daily - dose reduced from tartrate ok with orthostatic hypotension ?- Losartan on hold with orthostatic hypotension ?- Continue spironolactone 12.5 mg daily ? - Continue Jardiance 10 mg daily ?- Agree with stopping amlodipine with reduced LVEF ?- IV iron x 1 given on 3/28 ? ?Plan: ?1) Medication changes recommended at this time: ?- Continue current regimen ? ?2) Patient assistance: ?- Entresto copay $47 ?- Jardiance copay  $47 ? ?3)  Education  ?- To be completed prior to discharge ? ?MKerby Nora PharmD, BCPS ?Heart Failure Stewardship Pharmacist ?Phone [phone]? ? ?

## 2022-03-03 NOTE — Progress Notes (Signed)
Inpatient Rehab Admissions Coordinator:  ? ?Awaiting determination from [place] regarding prior auth request for CIR.  ? ?Shann Medal, PT, DPT ?Admissions Coordinator ?[phone] ?03/03/22  ?12:08 PM ? ?

## 2022-03-04 DIAGNOSIS — I5023 Acute on chronic systolic (congestive) heart failure: Secondary | ICD-10-CM | POA: Diagnosis not present

## 2022-03-04 LAB — PROTIME-INR
INR: 2.5 — ABNORMAL HIGH (ref 0.8–1.2)
Prothrombin Time: 26.8 seconds — ABNORMAL HIGH (ref 11.4–15.2)

## 2022-03-04 MED ORDER — WARFARIN SODIUM 4 MG PO TABS
4.0000 mg | ORAL_TABLET | Freq: Once | ORAL | Status: AC
  Administered 2022-03-04: 4 mg via ORAL
  Filled 2022-03-04: qty 1

## 2022-03-04 MED ORDER — SPIRONOLACTONE 25 MG PO TABS
25.0000 mg | ORAL_TABLET | Freq: Every day | ORAL | Status: DC
  Administered 2022-03-04 – 2022-03-06 (×3): 25 mg via ORAL
  Filled 2022-03-04 (×3): qty 1

## 2022-03-04 NOTE — Progress Notes (Signed)
Mobility Specialist Progress Note ? ? 03/04/22 1230  ?Mobility  ?Activity Ambulated with assistance in hallway  ?Level of Assistance Minimal assist, patient does 75% or more  ?Assistive Device Front wheel walker  ?Distance Ambulated (ft) 220 ft ?(90 +110+20)  ?Activity Response Tolerated well  ?$Mobility charge 1 Mobility  ? ?Pre Mobility: 74 HR, 97% SpO2 on RA ?During Mobility: 89 HR, 88% SpO2 on RA ?Post Mobility: 80 HR 98% SpO2 on RA ? ?Received pt in chair w/ no complaints and agreeable. Upon standing slight LOB corrected by CG and cueing on weight distribution when standing. X2 standing rest breaks d/t O2 levels desating between 84 - 89% SpO2. Recovered quickly w/ pursed lip breathing for ~28mns. Returned back to bed w/o fault but min cueing on sequencing d/t slight hearing deficit. Call bell placed in reach. ? ?JHolland Falling?Mobility Specialist ?Phone Number [phone]? ?

## 2022-03-04 NOTE — Plan of Care (Signed)

## 2022-03-04 NOTE — Progress Notes (Signed)
Occupational Therapy Treatment ?Patient Details ?Name: Jesus [name] ?MRN: [id number] ?DOB: Jun 22, 1941 ?Today's Date: 03/04/2022 ? ? ?History of present illness The pt is an 81 yo male presenting 3/26 with SOB. Pt recently here for bilateral LE edema on 3/23 and DVT was ruled out so he was d/c home on 3/24. This admission pt found to have pulmonary edema and bilateral pleural effusions. PMH includes: CAD s/p CABG, NSTEMI 02/20/22 s/p cardiac cath with stent placement, ischemic cardiomyopathy, afib on coumadin, aortic stenosis s/p TAVR 2016, HLD, and HTN. ?  ?OT comments ? Patient received in bed and eager to participate with OT treatment. Patient was able to get to EOB without assistance and ambulated to sink with RW and min guard assist. Patient was min assist to transfer to 3n1 and min guard for hygiene. Patient continues to make good progress with OT treatment and is a good candidate for AIR. Acute OT to continue to follow.   ? ?Recommendations for follow up therapy are one component of a multi-disciplinary discharge planning process, led by the attending physician.  Recommendations may be updated based on patient status, additional functional criteria and insurance authorization. ?   ?Follow Up Recommendations ? Acute inpatient rehab (3hours/day)  ?  ?Assistance Recommended at Discharge Intermittent Supervision/Assistance  ?Patient can return home with the following ? A little help with walking and/or transfers;A little help with bathing/dressing/bathroom;Assist for transportation;Help with stairs or ramp for entrance;Assistance with cooking/housework ?  ?Equipment Recommendations ? None recommended by OT  ?  ?Recommendations for Other Services   ? ?  ?Precautions / Restrictions Precautions ?Precautions: Fall ?Precaution Comments: orthostatic hypotension ?Restrictions ?Weight Bearing Restrictions: No  ? ? ?  ? ?Mobility Bed Mobility ?Overal bed mobility: Modified Independent ?  ?  ?  ?  ?  ?  ?General bed mobility  comments: able to get to EOB without assistance ?  ? ?Transfers ?Overall transfer level: Needs assistance ?Equipment used: Rolling walker (2 wheels) ?Transfers: Sit to/from Stand ?Sit to Stand: Min guard ?  ?  ?  ?  ?  ?General transfer comment: no LOB with RW use ?  ?  ?Balance Overall balance assessment: Needs assistance ?Sitting-balance support: No upper extremity supported ?Sitting balance-Leahy Scale: Good ?  ?  ?Standing balance support: No upper extremity supported, Bilateral upper extremity supported, During functional activity ?Standing balance-Leahy Scale: Fair ?Standing balance comment: able to stand at sink for grooming task and able to stand for toilet hygiene ?  ?  ?  ?  ?  ?  ?  ?  ?  ?  ?  ?   ? ?ADL either performed or assessed with clinical judgement  ? ?ADL Overall ADL's : Needs assistance/impaired ?  ?  ?Grooming: Wash/dry hands;Wash/dry face;Oral care;Brushing hair;Min guard;Standing ?Grooming Details (indicate cue type and reason): stood at sink with min guard and no complaints of fatigue ?  ?  ?  ?  ?  ?  ?  ?  ?Toilet Transfer: Minimal assistance;BSC/3in1;Rolling walker (2 wheels) ?Toilet Transfer Details (indicate cue type and reason): cues to use RW ?Toileting- Water quality scientist and Hygiene: Min guard;Sit to/from stand ?Toileting - Clothing Manipulation Details (indicate cue type and reason): able to perform toilet hygiene standing ?  ?  ?  ?General ADL Comments: used RW with mobility states he did not use one at home ?  ? ?Extremity/Trunk Assessment   ?  ?  ?  ?  ?  ? ?Vision   ?  ?  ?  Perception   ?  ?Praxis   ?  ? ?Cognition Arousal/Alertness: Awake/alert ?Behavior During Therapy: [facility] for tasks assessed/performed ?Overall Cognitive Status: Within Functional Limits for tasks assessed ?  ?  ?  ?  ?  ?  ?  ?  ?  ?  ?  ?  ?  ?  ?  ?  ?General Comments: motivated towards therapy and getting home ?  ?  ?   ?Exercises   ? ?  ?Shoulder Instructions   ? ? ?  ?General Comments    ? ? ?Pertinent  Vitals/ Pain       Pain Assessment ?Pain Assessment: Faces ?Faces Pain Scale: Hurts a little bit ?Pain Location: L knee ?Pain Descriptors / Indicators: Discomfort, Guarding ?Pain Intervention(s): Monitored during session, Repositioned ? ?Home Living   ?  ?  ?  ?  ?  ?  ?  ?  ?  ?  ?  ?  ?  ?  ?  ?  ?  ?  ? ?  ?Prior Functioning/Environment    ?  ?  ?  ?   ? ?Frequency ? Min 2X/week  ? ? ? ? ?  ?Progress Toward Goals ? ?OT Goals(current goals can now be found in the care plan section) ? Progress towards OT goals: Progressing toward goals ? ?Acute Rehab OT Goals ?Patient Stated Goal: get better ?OT Goal Formulation: With patient ?Time For Goal Achievement: 03/16/22 ?Potential to Achieve Goals: Good ?ADL Goals ?Pt Will Perform Grooming: with modified independence;standing ?Pt Will Perform Upper Body Bathing: with supervision ?Pt Will Perform Lower Body Bathing: with supervision;sit to/from stand ?Pt Will Perform Upper Body Dressing: with supervision;sitting ?Pt Will Perform Lower Body Dressing: with supervision;sit to/from stand ?Pt Will Transfer to Toilet: with supervision;ambulating;regular height toilet ?Pt Will Perform Toileting - Clothing Manipulation and hygiene: with supervision;sit to/from stand ?Pt Will Perform Tub/Shower Transfer: Shower transfer;with supervision;ambulating;shower seat ?Pt/caregiver will Perform Home Exercise Program: Increased ROM;Both right and left upper extremity;Independently;With theraband;With written HEP provided  ?Plan Discharge plan remains appropriate   ? ?Co-evaluation ? ? ?   ?  ?  ?  ?  ? ?  ?AM-PAC OT "6 Clicks" Daily Activity     ?Outcome Measure ? ? Help from another person eating meals?: None ?Help from another person taking care of personal grooming?: A Little ?Help from another person toileting, which includes using toliet, bedpan, or urinal?: A Lot ?Help from another person bathing (including washing, rinsing, drying)?: A Little ?Help from another person to put on and  taking off regular upper body clothing?: A Little ?Help from another person to put on and taking off regular lower body clothing?: A Little ?6 Click Score: 18 ? ?  ?End of Session Equipment Utilized During Treatment: Gait belt;Rolling walker (2 wheels) ? ?OT Visit Diagnosis: Unsteadiness on feet (R26.81) ?  ?Activity Tolerance Patient tolerated treatment well ?  ?Patient Left in chair;with call bell/phone within reach;with chair alarm set ?  ?Nurse Communication Mobility status ?  ? ?   ? ?Time: 9937-1696 ?OT Time Calculation (min): 21 min ? ?Charges: OT General Charges ?$OT Visit: 1 Visit ?OT Treatments ?$Self Care/Home Management : 8-22 mins ? ?Lodema Hong, OTA ?Acute Rehabilitation Services  ?Pager [phone] ?Office [phone] ? ? ?[place] ?03/04/2022, 9:35 AM ?

## 2022-03-04 NOTE — Progress Notes (Signed)
Inpatient Rehab Admissions Coordinator:  ? ?Notified by Bernadene Bell that request for CIR prior Josem Kaufmann has been denied. I let pt/family know and they would like to pursue expedited appeal. I will start that today.  ? ?Shann Medal, PT, DPT ?Admissions Coordinator ?[phone] ?03/04/22  ?4:41 PM ? ?

## 2022-03-04 NOTE — Progress Notes (Signed)
Heart Failure Stewardship Pharmacist Progress Note ? ? ?PCP: [name], MD ?PCP-Cardiologist: [name], MD  ? ? ?HPI:  ?81 yo M with PMH of CAD s/p CABG, CHF, afib, aortic stenosis s/p TAVR, HLD, and HTN. He presented to the ED on 3/26 with shortness of breath and LE edema. CXR with possible mild interstitial edema and small bilateral pleural effusions. His last ECHO from 3/15 with LVEF 35-40% and G2DD. Pending CIR admission. ? ?Current HF Medications: ?Beta Blocker: metoprolol XL 25 mg daily ?Aldosterone Antagonist: spironolactone 25 mg daily ?SGLT2i: Jardiance 10 mg daily ? ?Prior to admission HF Medications: ?Beta blocker: metoprolol tartrate 25 mg BID ?ACE/ARB/ARNI: losartan 50 mg daily ? ?Pertinent Lab Values: ?As of 3/30: Serum creatinine 1.41, BUN 26, Potassium 4.1, Sodium 139, BNP 1033.5, A1c 5.9 ?TSAT 6 ? ?Vital Signs: ?Weight: 121 lbs (admission weight: 132 lbs) ?Blood pressure: 120/60s  ?Heart rate: 80-90s  ?I/O: -568m yesterday; net -3.2L ? ?Medication Assistance / Insurance Benefits Check: ?Does the patient have prescription insurance?  Yes ?[facility] of insurance plan: [facility] ? ?Outpatient Pharmacy:  ?[facility] ?Is the patient willing to use [facility]? Yes ?Is the patient [facility]?   Pending ?  ? ?Assessment: ?1. Acute on chronic systolic CHF (EF 366-29%, due to ICM. NYHA class II symptoms. ?- No IV diuretics ?- Continue metoprolol XL 25 mg daily - dose reduced from tartrate ok with orthostatic hypotension ?- Losartan on hold with orthostatic hypotension ?- Continue spironolactone 25 mg daily ? - Continue Jardiance 10 mg daily ?- Agree with stopping amlodipine with reduced LVEF ?- IV iron x 1 given on 3/28 ? ?Plan: ?1) Medication changes recommended at this time: ?- Continue current regimen ? ?2) Patient assistance: ?- Entresto copay $47 ?- Jardiance  copay $47 ? ?3)  Education  ?- To be completed prior to discharge ? ?MKerby Nora PharmD, BCPS ?Heart Failure Stewardship Pharmacist ?Phone [phone]? ? ?

## 2022-03-04 NOTE — Progress Notes (Signed)
?PROGRESS NOTE ? ? ? ?Jesus [name]  KWI:[id number] DOB: Sep 07, 1941 DOA: 02/27/2022 ?PCP: [name], MD  ?Narrative [name] with history of CAD, CABG, AS, TAVR, chronic systolic CHF, hypertension, paroxysmal A-fib on Coumadin was recently hospitalized with NSTEMI from 3/14-19, cath noted severe native vessel CAD s/p 6V CABG with 6/6 patent grafts, severe stenosis in the proximal body of the SVG to the PDA-Successful PTCA/DES x 1 proximal body of SVG to PDA. ?-Presented to the ED 3/26 with increase dyspnea on exertion and lower extremity edema, chest x-ray noted pleural effusion, pulmonary edema, BNP 1033 ? ?Subjective: ?-Feels better overall, no dizziness today, breathing has improved, leg pains have improved ? ?Assessment & Plan: ? ?Acute on chronic systolic congestive heart failure ([place]) ?Acute hypoxic respiratory failure ?-Recent echo 3/15 echocardiogram with reduced LV systolic function with EF 35 to 40%, with inferior and inferior lateral hypokinesis. Preserved RV systolic function. Left atrium with severe dilatation, no significant valvular disease.  (SP TAVR).  ?-Diuresed with IV Lasix, he is 3.4 L negative ?-Additional diuretics held on account of soft BPs, orthostatic symptoms ?-BP improving, continue current regimen of Toprol, Aldactone, empagliflozin ?-Discharge planning, medically stable for CIR awaiting authorization ? ?Coronary artery disease involving coronary bypass graft of native heart without angina pectoris ?-Minimally elevated high-sensitivity troponin in the setting of CHF ?-Recent PTCA/stenting of SVG to PDA ?-Continue Plavix, metoprolol, statin, warfarin ? ?Acute kidney injury superimposed on chronic kidney disease ([place]) ?CKD stage 2. ?-Creatinine gradually worsened to 1.5, now improving ?-Lasix and ARB held ?-Continue Aldactone, SGLT2i, avoid hypotension ?-BP soft overnight, stable now ? ?Paroxysmal atrial fibrillation (HCC) ?History of non sustained ventricular tachycardia.    ?-Anticoagulation with warfarin, INR is 3.3 ? ?Essential hypertension ?-Developed orthostatic hypotension 3/28, amlodipine and losartan discontinued ?-Continue  spironolactone and metoprolol.   ? ?Iron deficiency anemia ?Iron panel with serum iron at 15, tibc 242, transferrin saturation 6, consistent with iron deficiency. ?-Received IV iron supplementation.  ? ?GERD without esophagitis ?Continue with pantoprazole with good toleration.  ? ?DVT prophylaxis: Warfarin ?Code Status: DNR ?Family Communication: Discussed patient in detail, no family at bedside ?Disposition Plan: CIR when bed available ?Consultants:  ? ? ?Procedures:  ? ?Antimicrobials:  ? ? ?Objective: ?Vitals:  ? 03/04/22 0500 03/04/22 0748 03/04/22 0932 03/04/22 1122  ?BP:  125/76  122/74  ?Pulse:  82 98 90  ?Resp:  19  20  ?Temp:  98 ?F (36.7 ?C)    ?TempSrc:      ?SpO2:      ?Weight: 55.2 kg     ?Height:      ? ? ?Intake/Output Summary (Last 24 hours) at 03/04/2022 1435 ?Last data filed at 03/04/2022 2426 ?Gross per 24 hour  ?Intake 360 ml  ?Output 650 ml  ?Net -290 ml  ? ?Filed Weights  ? 03/02/22 0500 03/03/22 0307 03/04/22 0500  ?Weight: 57.2 kg 55.7 kg 55.2 kg  ? ? ?Examination: ? ?General exam: Pleasant elderly male laying in bed, AAOx3, no distress ?HEENT: No JVD ?CVS: S1-S2, regular rate rhythm ?Lungs: Clear bilaterally ?Abdomen: Soft, nontender, bowel sounds present  ?Extremities: No edema ?Skin: No rashes ?Psychiatry: Judgement and insight appear normal. Mood & affect appropriate.  ? ? ? ?Data Reviewed:  ? ?CBC: ?Recent Labs  ?Lab 02/27/22 ?1822 02/28/22 ?0058 03/01/22 ?0034 03/03/22 ?0050  ?WBC 11.2* 13.3* 10.0 12.6*  ?NEUTROABS 8.8* 11.0*  --   --   ?HGB 10.2* 8.6* 10.9* 10.7*  ?HCT 31.8* 25.1* 32.5* 30.9*  ?MCV  103.6* 100.0 98.2 98.1  ?PLT 209 198 192 217  ? ?Basic Metabolic Panel: ?Recent Labs  ?Lab 02/27/22 ?1822 02/28/22 ?0058 03/01/22 ?0034 03/02/22 ?0121 03/03/22 ?0050  ?NA 137 139 140 140 139  ?K 4.3 3.6 3.5 3.6 4.1  ?CL 111 109 105  106 106  ?CO2 16* 20* '25 25 23  '$ ?GLUCOSE 103* 114* 100* 110* 112*  ?BUN 29* 27* 30* 29* 26*  ?CREATININE 1.13 1.32* 1.52* 1.51* 1.41*  ?CALCIUM 8.1* 8.3* 8.4* 8.5* 8.3*  ?MG  --  1.9  --   --   --   ? ?GFR: ?Estimated Creatinine Clearance: 29.4 mL/min (A) (by C-G formula based on SCr of 1.41 mg/dL (H)). ?Liver Function Tests: ?Recent Labs  ?Lab 02/28/22 ?6578  ?AST 35  ?ALT 46*  ?ALKPHOS 79  ?BILITOT 0.9  ?PROT 6.0*  ?ALBUMIN 3.0*  ? ?No results for input(s): LIPASE, AMYLASE in the last 168 hours. ?No results for input(s): AMMONIA in the last 168 hours. ?Coagulation Profile: ?Recent Labs  ?Lab 02/28/22 ?0058 03/01/22 ?0034 03/02/22 ?0121 03/03/22 ?0050 03/04/22 ?0036  ?INR 2.9* 3.3* 3.2* 3.1* 2.5*  ? ?Cardiac Enzymes: ?No results for input(s): CKTOTAL, CKMB, CKMBINDEX, TROPONINI in the last 168 hours. ?BNP (last 3 results) ?No results for input(s): PROBNP in the last 8760 hours. ?HbA1C: ?No results for input(s): HGBA1C in the last 72 hours. ?CBG: ?No results for input(s): GLUCAP in the last 168 hours. ?Lipid Profile: ?No results for input(s): CHOL, HDL, LDLCALC, TRIG, CHOLHDL, LDLDIRECT in the last 72 hours. ?Thyroid Function Tests: ?No results for input(s): TSH, T4TOTAL, FREET4, T3FREE, THYROIDAB in the last 72 hours. ?Anemia Panel: ?No results for input(s): VITAMINB12, FOLATE, FERRITIN, TIBC, IRON, RETICCTPCT in the last 72 hours. ? ?Urine analysis: ?   ?Component Value Date/Time  ? COLORURINE YELLOW 12/02/2020 1154  ? APPEARANCEUR CLEAR 12/02/2020 1154  ? LABSPEC 1.006 12/02/2020 1154  ? PHURINE 7.0 12/02/2020 1154  ? GLUCOSEU NEGATIVE 12/02/2020 1154  ? [place] NEGATIVE 01/29/2010 1351  ? [place] NEGATIVE 12/02/2020 1154  ? [place] NEGATIVE 12/02/2020 1154  ? BILIRUBINUR n 08/27/2014 1657  ? [place] NEGATIVE 12/02/2020 1154  ? PROTEINUR NEGATIVE 12/02/2020 1154  ? UROBILINOGEN 1.0 03/06/2015 1446  ? NITRITE NEGATIVE 12/02/2020 1154  ? LEUKOCYTESUR NEGATIVE 12/02/2020 1154  ? ?Sepsis  Labs: ?'@LABRCNTIP'$ (procalcitonin:4,lacticidven:4) ? ?) ?Recent Results (from the past 240 hour(s))  ?Resp Panel by RT-PCR (Flu A&B, Covid) Nasopharyngeal Swab     Status: None  ? Collection Time: 02/27/22  6:51 PM  ? Specimen: Nasopharyngeal Swab; Nasopharyngeal(NP) swabs in vial transport medium  ?Result Value Ref Range Status  ? SARS Coronavirus 2 by RT PCR NEGATIVE NEGATIVE Final  ?  Comment: (NOTE) ?SARS-CoV-2 target nucleic acids are NOT DETECTED. ? ?The SARS-CoV-2 RNA is generally detectable in upper respiratory ?specimens during the acute phase of infection. The lowest ?concentration of SARS-CoV-2 viral copies this assay can detect is ?138 copies/mL. A negative result does not preclude SARS-Cov-2 ?infection and should not be used as the sole basis for treatment or ?other patient management decisions. A negative result may occur with  ?improper specimen collection/handling, submission of specimen other ?than nasopharyngeal swab, presence of viral mutation(s) within the ?areas targeted by this assay, and inadequate number of viral ?copies(<138 copies/mL). A negative result must be combined with ?clinical observations, patient history, and epidemiological ?information. The expected result is Negative. ? ?Fact Sheet for Patients:  ?EntrepreneurPulse.com.au ? ?Fact Sheet for Healthcare Providers:  ?IncredibleEmployment.be ? ?This test is no t yet  approved or cleared by the [place] and  ?has been authorized for detection and/or diagnosis of SARS-CoV-2 by ?FDA under an Emergency Use Authorization (EUA). This EUA will remain  ?in effect (meaning this test can be used) for the duration of the ?COVID-19 declaration under Section 564(b)(1) of the Act, 21 ?U.S.C.section 360bbb-3(b)(1), unless the authorization is terminated  ?or revoked sooner.  ? ? ?  ? Influenza A by PCR NEGATIVE NEGATIVE Final  ? Influenza B by PCR NEGATIVE NEGATIVE Final  ?  Comment: (NOTE) ?The Xpert  Xpress SARS-CoV-2/FLU/RSV plus assay is intended as an aid ?in the diagnosis of influenza from Nasopharyngeal swab specimens and ?should not be used as a sole basis for treatment. Nasal washings and ?aspirates are unacceptable for Xpert Xpr

## 2022-03-04 NOTE — Progress Notes (Signed)
ANTICOAGULATION CONSULT NOTE - Follow Up Consult ? ?Pharmacy Consult for Warfarin ?Indication: TAVR / PAF ? ?Allergies  ?Allergen Reactions  ? Codeine Other (See Comments)  ?  Pt does not remember  ? Iodine Swelling  ? ? ?Patient Measurements: ?Height: '5\' 1"'$  (154.9 cm) ?Weight: 55.2 kg (121 lb 11.1 oz) ?IBW/kg (Calculated) : 52.3 ? ?Vital Signs: ?Temp: 97.9 ?F (36.6 ?C) (03/31 0344) ?Temp Source: Oral (03/31 0344) ?BP: 114/73 (03/31 0344) ?Pulse Rate: 89 (03/31 0344) ? ?Labs: ?Recent Labs  ?  03/02/22 ?0121 03/03/22 ?0050 03/04/22 ?0036  ?HGB  --  10.7*  --   ?HCT  --  30.9*  --   ?PLT  --  217  --   ?LABPROT 33.0* 32.0* 26.8*  ?INR 3.2* 3.1* 2.5*  ?CREATININE 1.51* 1.41*  --   ? ? ? ?Estimated Creatinine Clearance: 29.4 mL/min (A) (by C-G formula based on SCr of 1.41 mg/dL (H)). ? ? ?Assessment: ?On warfarin PTA for Afib- > 4 mg daily  ?INR therapeutic, down to 2.5. CBC stable 3/30. No bleeding issues noted.  ?Warfarin held 3/28 and 3/29 to allow INR to drift down. ? ?Of note: Waiting for insurance approval for CIR. ? ?Goal of Therapy:  ?INR 2-3 ?Monitor platelets by anticoagulation protocol: Yes ?  ?Plan:  ?Give warfarin 4 mg po now x 1 ?Monitor daily INR, CBC, clinical course, s/sx of bleed, PO intake/diet, Drug-Drug Interactions ? ? ? ?Thank you for allowing [place] to participate in this patients care. ?Jens Som, PharmD ?03/04/2022 7:34 AM ? ?**Pharmacist phone directory can be found on [email or web address] listed under [place]** ? ? ?

## 2022-03-04 NOTE — TOC Initial Note (Addendum)
Transition of Care (TOC) - Initial/Assessment Note  ? ? ?Patient Details  ?Name: Jesus [name] ?MRN: [id number] ?Date of Birth: [birth year] ? ?Transition of Care (TOC) CM/SW Contact:    ?Angelita Ingles, RN ?Phone Number:[phone] ? ?03/04/2022, 3:53 PM ? ?Clinical Narrative:                 ?TOC assessment for patient with high risk for readmission. Patient states that hew is from home where he functions independently. NO HH services no DME. Per patient PCP is Dr. Jarold Song unsure of first name. Patient uses [facility] for medications. Patient states that he has no difficulties obtaining medications. CIR follwing for potential placement. There are currently no TOC needs. ? ?Expected Discharge Plan: [place] ?Barriers to Discharge: Continued Medical Work up ? ? ?Patient Goals and CMS Choice ?Patient states their goals for this hospitalization and ongoing recovery are:: Wants to get better ?  ?Choice offered to / list presented to : NA ? ?Expected Discharge Plan and Services ?Expected Discharge Plan: [place] ?In-house Referral: NA ?Discharge Planning Services: NA ?Post Acute Care Choice: NA ?Living arrangements for the past 2 months: [place] ?Expected Discharge Date: 03/03/22               ?DME Arranged: N/A ?DME Agency: NA ?  ?  ?  ?HH Arranged: NA ?[place] Agency: NA ?  ?  ?  ? ?Prior Living Arrangements/Services ?Living arrangements for the past 2 months: [place] ?Lives with:: Self ?Patient language and need for interpreter reviewed:: Yes ?Do you feel safe going back to the place where you live?: Yes      ?Need for Family Participation in Patient Care: Yes (Comment) ?Care giver support system in place?: Yes (comment) ?Current home services:  (n/a) ?Criminal Activity/Legal Involvement Pertinent to Current Situation/Hospitalization: No - Comment as needed ? ?Activities of Daily Living ?  ?  ? ?Permission Sought/Granted ?  ?Permission granted to share  information with : No ?   ?   ?   ?   ? ?Emotional Assessment ?Appearance:: Appears younger than stated age ?Attitude/Demeanor/Rapport: Gracious ?Affect (typically observed): Pleasant ?Orientation: : Oriented to Self, Oriented to Place, Oriented to  Time, Oriented to Situation ?  ?Psych Involvement: No (comment) ? ?Admission diagnosis:  Acute on chronic systolic congestive heart failure ([place]) [I50.23] ?Anemia, unspecified type [D64.9] ?Acute on chronic congestive heart failure, unspecified heart failure type ([place]) [I50.9] ?Patient Active Problem List  ? Diagnosis Date Noted  ? Acute kidney injury superimposed on chronic kidney disease ([place]) 03/01/2022  ? Acute on chronic systolic congestive heart failure ([place]) 02/27/2022  ? Paroxysmal atrial fibrillation ([place]) 02/20/2022  ? S/P angioplasty with stent 02/17/22 DES to proximal VG to PDA 02/20/2022  ? NSTEMI (non-ST elevated myocardial infarction) ([place]) 02/15/2022  ? Chronic combined systolic and diastolic CHF (congestive heart failure) ([place]) 04/16/2018  ? Long term (current) use of anticoagulants 08/25/2017  ? Actinic keratoses 03/21/2017  ? Internal hemorrhoid, bleeding 07/01/2015  ? Rectal bleeding 06/15/2015  ? Internal hemorrhoids 06/15/2015  ? Other constipation 06/15/2015  ? Chronic anticoagulation-Cou[place]din 03/12/2015  ? 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  ? Iron deficiency anemia 09/03/2014  ? Encounter for therapeutic drug monitoring 12/31/2013  ? Yellow jacket sting 08/06/2013  ? Low back pain on right side with sciatica 04/24/2013  ? Severe aortic stenosis 08/10/2012  ? Coronary artery disease involving coronary bypass graft of native heart without angina  pectoris 08/10/2012  ? Left ventricular dysfunction 08/10/2012  ? Routine health maintenance 07/12/2012  ? BEE STING REACTION, LOCAL 09/21/2010  ? BEN LOC HYPERPLASIA PROS W/UR OBST & OTH LUTS 08/02/2010  ? Acute and chronic cholecystitis 02/01/2010  ? Abdominal pain,  generalized 01/29/2010  ? CORONARY ATHEROSCLEROSIS NATIVE CORONARY ARTERY 10/13/2009  ? HYPERTHYROIDISM 05/22/2009  ? INSOMNIA 05/22/2009  ? Mixed hyperlipidemia 04/22/2009  ? Cardiomyopathy, ischemic-EF 35% 04/22/2009  ? CAD, AUTOLOGOUS BYPASS GRAFT 10/16/2008  ? Essential hypertension 09/07/2007  ? Aortic valve disorder 09/07/2007  ? GERD without esophagitis 09/07/2007  ? 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  ? ?PCP:  [name], MD ?Pharmacy:   ?[id number] [place], [place] - [address] ?[address] ?[facility] [address]?Phone: [phone]: [phone]? ?[facility] ?[address]?[place]?Phone: [phone]: [phone] ? ? ? ? ?Social Determinants of Health (SDOH) Interventions ?Food Insecurity Interventions: Intervention Not Indicated ?Financial Strain Interventions: Intervention Not Indicated ?Housing Interventions: Intervention Not Indicated ?Transportation Interventions: Intervention Not Indicated ? ?Readmission Risk Interventions ? ?  03/04/2022  ?  3:49 PM  ?Readmission Risk Prevention Plan  ?Transportation Screening Complete  ?Medication Review (Press photographer Complete  ?PCP or Specialist appointment within 3-5 days of discharge Complete  ?[facility] Complete  ?SW Recovery Care/Counseling Consult Complete  ?Palliative Care Screening Not Applicable  ?Skilled Nursing Facility Complete  ? ? ? ?

## 2022-03-04 NOTE — Progress Notes (Signed)
Heart Failure Nurse Navigator Progress Note ? ?H&V TOC appt rescheduled from 03/09/22 to 03/15/22 as pt is still hospitalized. ? ?Kevan Rosebush, RN, BSN, CHFN ?Heart Failure Navigator ?Heart & Vascular Care Navigation Team ? ?

## 2022-03-04 NOTE — Progress Notes (Signed)
Physical Therapy Treatment ?Patient Details ?Name: Jesus [name] ?MRN: [id number] ?DOB: 12-Nov-1941 ?Today's Date: 03/04/2022 ? ? ?History of Present Illness The pt is an 81 yo male presenting 3/26 with SOB. Pt recently here for bilateral LE edema on 3/23 and DVT was ruled out so he was d/c home on 3/24. This admission pt found to have pulmonary edema and bilateral pleural effusions. PMH includes: CAD s/p CABG, NSTEMI 02/20/22 s/p cardiac cath with stent placement, ischemic cardiomyopathy, afib on coumadin, aortic stenosis s/p TAVR 2016, HLD, and HTN. ? ?  ?PT Comments  ? ? Pt received in supine, eager to participate in therapy session and with good tolerance for gait training with and without RW and step-ups to 7" platform with BUE support. Pt needing up to minA for balance when not using AD due to multiple episodes of lateral LOB and steadier with AD use. Pt tolerated >35 mins of activity including seated breaks and reports only minimal fatigue. Pt agreeable to sit up in chair and able to demo back call bell when needing to return to bed or bathroom. Pt continues to benefit from PT services to progress toward functional mobility goals.   ?Recommendations for follow up therapy are one component of a multi-disciplinary discharge planning process, led by the attending physician.  Recommendations may be updated based on patient status, additional functional criteria and insurance authorization. ? ?Follow Up Recommendations ? Acute inpatient rehab (3hours/day) (HHPT if pt decides to go home) ?  ?  ?Assistance Recommended at Discharge Intermittent Supervision/Assistance  ?Patient can return home with the following A little help with bathing/dressing/bathroom;Assist for transportation;Help with stairs or ramp for entrance;A lot of help with walking and/or transfers;Assistance with cooking/housework ?  ?Equipment Recommendations ? None recommended by PT  ?  ?Recommendations for Other Services   ? ? ?  ?Precautions /  Restrictions Precautions ?Precautions: Fall ?Precaution Comments: monitor BP (previously orthostatic) ?Restrictions ?Weight Bearing Restrictions: No  ?  ? ?Mobility ? Bed Mobility ?Overal bed mobility: Needs Assistance ?Bed Mobility: Supine to Sit ?  ?  ?Supine to sit: Supervision ?  ?  ?General bed mobility comments: cues needed for anterior scooting to prevent posterior LOB upon sitting up ?  ? ?Transfers ?Overall transfer level: Needs assistance ?Equipment used: Rolling walker (2 wheels), None ?Transfers: Sit to/from Stand ?Sit to Stand: Min guard, Supervision ?  ?  ?  ?  ?  ?General transfer comment: from EOB supervision/cues for hand placement to RW, pt needs up to min guard for standing x5 reps with arms crossed ?  ? ?Ambulation/Gait ?Ambulation/Gait assistance: Min assist, Min guard ?Gait Distance (Feet): 150 Feet (x2 with longer standing break using RW, seated break, then 121f with no AD) ?Assistive device: Rolling walker (2 wheels), None ?Gait Pattern/deviations: Decreased stride length, Decreased stance time - left, Step-through pattern, Trunk flexed, Antalgic ?Gait velocity: decreased ?  ?  ?General Gait Details: Pt with rounded shoulders posture and decreased L stance time with mildly antalgic appearing gait but denies pain. Min guard with no LOB when using RW, but minA with x2 LOB bouts when ambulating without UE support. Pt with increased difficulty advancing legs and lateral staggering, especially when scanning surroundings when ambulating without RW. Educated pt to use RW at d/c for safety at this time and of his risk for falls. Pt verbalized understanding. ? ? ?Stairs ?Stairs: Yes ?Stairs assistance: Min assist ?Stair Management: With walker, Forwards, Step to pattern ?Number of Stairs: 5 ?General stair comments: cues  for step sequencing, pt needs reminders each step for compliance due to decreased carryover of cues. ? ? ?Wheelchair Mobility ?  ? ?Modified Rankin (Stroke Patients Only) ?  ? ? ?   ?Balance Overall balance assessment: Needs assistance ?Sitting-balance support: No upper extremity supported ?Sitting balance-Leahy Scale: Good ?  ?  ?Standing balance support: No upper extremity supported, Bilateral upper extremity supported, During functional activity ?Standing balance-Leahy Scale:  (Poor dynamic, fair static) ?Standing balance comment: lateral LOB (multiple) for ambulation without RW especially with head turns, needs external assist when not using AD; fair static standing ?  ?  ?  ?  ?  ?  ?  ?  ?  ?  ?  ?  ? ?  ?Cognition Arousal/Alertness: Awake/alert ?Behavior During Therapy: [facility] for tasks assessed/performed ?Overall Cognitive Status: Within Functional Limits for tasks assessed ?  ?  ?  ?  ?  ?  ?  ?  ?  ?  ?  ?  ?  ?  ?  ?  ?General Comments: motivated towards therapy and getting home, HoH even with hearing aids at times ?  ?  ? ?  ?Exercises Other Exercises ?Other Exercises: STS x 5 reps x 2 sets with arms crossed (from EOB and from chair) ?Other Exercises: step-ups x5 reps onto 7" step for BLE strengthening ? ?  ?General Comments General comments (skin integrity, edema, etc.): BP 102/52 (68) seated EOB, SBP 114 standing; SpO2 96% on RA resting and noisy signal during ambulation but not dyspneic and when signal reading, ~92% and above. HR WFL. ?  ?  ? ?Pertinent Vitals/Pain Pain Assessment ?Pain Assessment: No/denies pain ?Pain Intervention(s): Monitored during session, Repositioned  ? ? ? ?PT Goals (current goals can now be found in the care plan section) Acute Rehab PT Goals ?Patient Stated Goal: to go back to work ?PT Goal Formulation: With patient ?Time For Goal Achievement: 03/14/22 ?Progress towards PT goals: Progressing toward goals ? ?  ?Frequency ? ? ? Min 3X/week ? ? ? ?  ?PT Plan Current plan remains appropriate  ? ? ?   ?AM-PAC PT "6 Clicks" Mobility   ?Outcome Measure ? Help needed turning from your back to your side while in a flat bed without using bedrails?: A Little ?Help  needed moving from lying on your back to sitting on the side of a flat bed without using bedrails?: A Little ?Help needed moving to and from a bed to a chair (including a wheelchair)?: A Little ?Help needed standing up from a chair using your arms (e.g., wheelchair or bedside chair)?: A Little ?Help needed to walk in hospital room?: A Lot (mod cues without RW, min cues with RW) ?Help needed climbing 3-5 steps with a railing? : A Lot (mod cues for step sequencing) ?6 Click Score: 16 ? ?  ?End of Session Equipment Utilized During Treatment: Gait belt ?Activity Tolerance: Patient tolerated treatment well ?Patient left: in chair;with call bell/phone within reach;Other (comment) (set up to eat lunch) ?Nurse Communication: Mobility status;Other (comment) (pt reports RUE IV site "feels loose") ?PT Visit Diagnosis: Other abnormalities of gait and mobility (R26.89);Unsteadiness on feet (R26.81);Difficulty in walking, not elsewhere classified (R26.2) ?  ? ? ?Time: 7989-2119 ?PT Time Calculation (min) (ACUTE ONLY): 40 min ? ?Charges:  $Gait Training: 23-37 mins ?$Therapeutic Exercise: 8-22 mins          ?          ? ?Amiel Sharrow P., PTA ?Acute Rehabilitation Services ?  Secure Chat Preferred 9a-5:30pm ?Office: [phone]  ? ? ?Kara Pacer Marshell Rieger ?03/04/2022, 4:53 PM ? ?

## 2022-03-05 DIAGNOSIS — I5023 Acute on chronic systolic (congestive) heart failure: Secondary | ICD-10-CM | POA: Diagnosis not present

## 2022-03-05 LAB — BASIC METABOLIC PANEL
Anion gap: 8 (ref 5–15)
BUN: 30 mg/dL — ABNORMAL HIGH (ref 8–23)
CO2: 24 mmol/L (ref 22–32)
Calcium: 8.4 mg/dL — ABNORMAL LOW (ref 8.9–10.3)
Chloride: 106 mmol/L (ref 98–111)
Creatinine, Ser: 1.3 mg/dL — ABNORMAL HIGH (ref 0.61–1.24)
GFR, Estimated: 55 mL/min — ABNORMAL LOW (ref >60)
Glucose, Bld: 109 mg/dL — ABNORMAL HIGH (ref 70–99)
Potassium: 4.2 mmol/L (ref 3.5–5.1)
Sodium: 138 mmol/L (ref 135–145)

## 2022-03-05 LAB — PROTIME-INR
INR: 2.1 — ABNORMAL HIGH (ref 0.8–1.2)
Prothrombin Time: 23.7 seconds — ABNORMAL HIGH (ref 11.4–15.2)

## 2022-03-05 LAB — CULTURE, BLOOD (ROUTINE X 2)
Culture: NO GROWTH
Culture: NO GROWTH
Special Requests: ADEQUATE
Special Requests: ADEQUATE

## 2022-03-05 LAB — CBC
HCT: 29.5 % — ABNORMAL LOW (ref 39.0–52.0)
Hemoglobin: 10.1 g/dL — ABNORMAL LOW (ref 13.0–17.0)
MCH: 33.8 pg (ref 26.0–34.0)
MCHC: 34.2 g/dL (ref 30.0–36.0)
MCV: 98.7 fL (ref 80.0–100.0)
Platelets: 213 10*3/uL (ref 150–400)
RBC: 2.99 MIL/uL — ABNORMAL LOW (ref 4.22–5.81)
RDW: 12.8 % (ref 11.5–15.5)
WBC: 8.6 10*3/uL (ref 4.0–10.5)
nRBC: 0 % (ref 0.0–0.2)

## 2022-03-05 MED ORDER — WARFARIN SODIUM 4 MG PO TABS
4.0000 mg | ORAL_TABLET | Freq: Once | ORAL | Status: AC
  Administered 2022-03-05: 4 mg via ORAL
  Filled 2022-03-05: qty 1

## 2022-03-05 MED ORDER — FUROSEMIDE 40 MG PO TABS
40.0000 mg | ORAL_TABLET | Freq: Every day | ORAL | Status: DC
  Administered 2022-03-05 – 2022-03-06 (×2): 40 mg via ORAL
  Filled 2022-03-05 (×2): qty 1

## 2022-03-05 NOTE — Progress Notes (Signed)
Physical Therapy Treatment ?Patient Details ?Name: Jesus [name] ?MRN: [id number] ?DOB: [birth year] ?Today's Date: 03/05/2022 ? ? ?History of Present Illness The pt is an 81 yo male presenting 3/26 with SOB. Pt recently here for bilateral LE edema on 3/23 and DVT was ruled out so he was d/c home on 3/24. This admission pt found to have pulmonary edema and bilateral pleural effusions. PMH includes: CAD s/p CABG, NSTEMI 02/20/22 s/p cardiac cath with stent placement, ischemic cardiomyopathy, afib on coumadin, aortic stenosis s/p TAVR 2016, HLD, and HTN. ? ?  ?PT Comments  ? ? Pt received in supine, agreeable to therapy session and with good participation and tolerance for gait training in hallway without AD. Pt needing up to minA for stability due to lateral LOB without BUE support and min guard for safety with transfers. Pt bed noted to be damp, encouraged him to notify staff if male purewick comes out of place as moisture can increase skin breakdown, RN notified. Pt up in chair to eat dinner at end of session. Pt continues to benefit from PT services to progress toward functional mobility goals.    ?Recommendations for follow up therapy are one component of a multi-disciplinary discharge planning process, led by the attending physician.  Recommendations may be updated based on patient status, additional functional criteria and insurance authorization. ? ?Follow Up Recommendations ? Acute inpatient rehab (3hours/day) (HHPT if pt decides to go home) ?  ?  ?Assistance Recommended at Discharge Intermittent Supervision/Assistance  ?Patient can return home with the following A little help with bathing/dressing/bathroom;Assist for transportation;Help with stairs or ramp for entrance;A lot of help with walking and/or transfers;Assistance with cooking/housework ?  ?Equipment Recommendations ? None recommended by PT (recommend he continue to use home RW)  ?  ?Recommendations for Other Services   ? ? ?  ?Precautions / Restrictions  Precautions ?Precautions: Fall ?Precaution Comments: BP ?Restrictions ?Weight Bearing Restrictions: No  ?  ? ?Mobility ? Bed Mobility ?Overal bed mobility: Needs Assistance ?Bed Mobility: Supine to Sit ?  ?  ?Supine to sit: Supervision ?  ?  ?General bed mobility comments: cues needed for anterior scooting to prevent posterior LOB upon sitting up ?  ? ?Transfers ?Overall transfer level: Needs assistance ?Equipment used: None ?Transfers: Sit to/from Stand ?Sit to Stand: Min guard ?  ?  ?  ?  ?  ?General transfer comment: min guard with no AD ?  ? ?Ambulation/Gait ?Ambulation/Gait assistance: Min assist, Min guard ?Gait Distance (Feet): 125 Feet (x2 with break) ?Assistive device: None ?Gait Pattern/deviations: Decreased stride length, Decreased stance time - left, Step-through pattern, Trunk flexed, Antalgic ?Gait velocity: decreased ?  ?  ?General Gait Details: Pt with forward head/rounded shoulders posture and slightly decreased L stance time with mildly antalgic appearing gait but denies pain. minA with a few LOB without UE support. Educated pt to use RW at d/c for safety at this time and of his risk for falls. Pt verbalized understanding. ? ? ? ?  ?Balance Overall balance assessment: Needs assistance ?Sitting-balance support: No upper extremity supported ?Sitting balance-Leahy Scale: Good ?  ?  ?Standing balance support: No upper extremity supported, Bilateral upper extremity supported, During functional activity ?Standing balance-Leahy Scale:  (Poor dynamic, fair static) ?Standing balance comment: lateral LOB (multiple) for ambulation without RW especially with head turns, needs external assist when not using AD; fair static standing ?  ?  ?  ?  ?  ?  ?  ?  ?  ?  ?  ?  ? ?  ?  Cognition Arousal/Alertness: Awake/alert ?Behavior During Therapy: [facility] for tasks assessed/performed ?Overall Cognitive Status: Within Functional Limits for tasks assessed ?  ?  ?  ?  ?  ?  ?  ?  ?  ?  ?  ?  ?  ?  ?  ?  ?General Comments:  motivated towards therapy and getting home, HoH even with hearing aids at times ?  ?  ? ?  ?Exercises   ? ?  ?General Comments General comments (skin integrity, edema, etc.): no dizziness reported with positional changes; SpO2 WFL on RA, poor signal during mobility but pt not dyspneic ?  ?  ? ?Pertinent Vitals/Pain Pain Assessment ?Pain Assessment: No/denies pain ?Pain Intervention(s): Monitored during session, Repositioned  ? ? ? ?PT Goals (current goals can now be found in the care plan section) Acute Rehab PT Goals ?Patient Stated Goal: to go back to work ?PT Goal Formulation: With patient ?Time For Goal Achievement: 03/14/22 ?Progress towards PT goals: Progressing toward goals ? ?  ?Frequency ? ? ? Min 3X/week ? ? ? ?  ?PT Plan Current plan remains appropriate  ? ? ?   ?AM-PAC PT "6 Clicks" Mobility   ?Outcome Measure ? Help needed turning from your back to your side while in a flat bed without using bedrails?: A Little ?Help needed moving from lying on your back to sitting on the side of a flat bed without using bedrails?: A Little ?Help needed moving to and from a bed to a chair (including a wheelchair)?: A Little ?Help needed standing up from a chair using your arms (e.g., wheelchair or bedside chair)?: A Little ?Help needed to walk in hospital room?: A Lot (mod cues without RW, min cues with RW) ?Help needed climbing 3-5 steps with a railing? : A Lot (mod cues for step sequencing) ?6 Click Score: 16 ? ?  ?End of Session Equipment Utilized During Treatment: Gait belt ?Activity Tolerance: Patient tolerated treatment well ?Patient left: in chair;with call bell/phone within reach;Other (comment) (set up to eat dinner) ?Nurse Communication: Mobility status ?PT Visit Diagnosis: Other abnormalities of gait and mobility (R26.89);Unsteadiness on feet (R26.81);Difficulty in walking, not elsewhere classified (R26.2) ?  ? ? ?Time: 1025-8527 ?PT Time Calculation (min) (ACUTE ONLY): 27 min ? ?Charges:  $Gait Training:  23-37 mins          ?          ? ?Darroll Bredeson P., PTA ?Acute Rehabilitation Services ?Secure Chat Preferred 9a-5:30pm ?Office: [phone]  ? ? ?Kara Pacer Cereniti Curb ?03/05/2022, 5:04 PM ? ?

## 2022-03-05 NOTE — Progress Notes (Signed)
ANTICOAGULATION CONSULT NOTE - Follow Up Consult ? ?Pharmacy Consult for Warfarin ?Indication: TAVR / PAF ? ?Allergies  ?Allergen Reactions  ? Codeine Other (See Comments)  ?  Pt does not remember  ? Iodine Swelling  ? ? ?Patient Measurements: ?Height: '5\' 1"'$  (154.9 cm) ?Weight: 58.3 kg (128 lb 8.5 oz) ?IBW/kg (Calculated) : 52.3 ? ?Vital Signs: ?Temp: 97.9 ?F (36.6 ?C) (04/01 0800) ?Temp Source: Oral (04/01 0800) ?BP: 122/47 (04/01 0800) ?Pulse Rate: 64 (04/01 0800) ? ?Labs: ?Recent Labs  ?  03/03/22 ?0050 03/04/22 ?0036 03/05/22 ?7209  ?HGB 10.7*  --  10.1*  ?HCT 30.9*  --  29.5*  ?PLT 217  --  213  ?LABPROT 32.0* 26.8* 23.7*  ?INR 3.1* 2.5* 2.1*  ?CREATININE 1.41*  --  1.30*  ? ? ? ?Estimated Creatinine Clearance: 31.8 mL/min (A) (by C-G formula based on SCr of 1.3 mg/dL (H)). ? ? ?Assessment: ?On warfarin PTA for Afib- > 4 mg daily  ?INR therapeutic, down to 2.1.  ?Warfarin held 3/28 and 3/29 to allow INR to drift down. ? ?Goal of Therapy:  ?INR 2-3 ?Monitor platelets by anticoagulation protocol: Yes ?  ?Plan:  ?Warfarin '4mg'$  po today ?Monitor daily INR ? ?Hildred Laser, PharmD ?Clinical Pharmacist ?**Pharmacist phone directory can now be found on [email or web address] ([email or web address]).  Listed under [place]. ? ? ?

## 2022-03-05 NOTE — Progress Notes (Signed)
Mobility Specialist Progress Note  ? ? 03/05/22 1244  ?Mobility  ?Activity Ambulated with assistance in hallway  ?Level of Assistance Minimal assist, patient does 75% or more  ?Assistive Device Front wheel walker  ?Distance Ambulated (ft) 150 ft  ?Activity Response Tolerated fair  ?$Mobility charge 1 Mobility  ? ?Pt received ready to come out of BR and agreeable to ambulate. Encouraged pursed lip breathing. Took a few short standing rest breaks. Returned to bed to rest with call bell in reach.  ? ?Hildred Alamin ?Mobility Specialist  ?  ?

## 2022-03-05 NOTE — Progress Notes (Signed)
?PROGRESS NOTE ? ? ? ?Jesus [name]  GPQ:[id number] DOB: Sep 04, 1941 DOA: 02/27/2022 ?PCP: [name], MD  ?Narrative [name] with history of CAD, CABG, AS, TAVR, chronic systolic CHF, hypertension, paroxysmal A-fib on Coumadin was recently hospitalized with NSTEMI from 3/14-19, cath noted severe native vessel CAD s/p 6V CABG with 6/6 patent grafts, severe stenosis in the proximal body of the SVG to the PDA-Successful PTCA/DES x 1 proximal body of SVG to PDA. ?-Presented to the ED 3/26 with increase dyspnea on exertion and lower extremity edema, chest x-ray noted pleural effusion, pulmonary edema, BNP 1033 ? ?Subjective: ?-Feels better overall, no dizziness today, breathing has improved, leg pains have improved ? ?Assessment & Plan: ? ?Acute on chronic systolic congestive heart failure ([place]) ?Acute hypoxic respiratory failure ?-Recent echo 3/15 echocardiogram with reduced LV systolic function with EF 35 to 40%, with inferior and inferior lateral hypokinesis. Preserved RV systolic function. Left atrium with severe dilatation, no significant valvular disease.  (SP TAVR).  ?-Diuresed with IV Lasix, he is 3.4 L negative ?-Additional diuretics held on account of soft BPs, orthostatic symptoms ?-Improving continue current regimen of Toprol, Aldactone, empagliflozin, add p.o. Lasix ?-Discharge planning, medically stable for CIR -awaiting insurance appeal ? ?Coronary artery disease involving coronary bypass graft of native heart without angina pectoris ?-Minimally elevated high-sensitivity troponin in the setting of CHF ?-Recent PTCA/stenting of SVG to PDA ?-Continue Plavix, metoprolol, statin, warfarin ? ?Acute kidney injury superimposed on chronic kidney disease ([place]) ?CKD stage 2. ?-Creatinine gradually worsened to 1.5, now improving ?-Lasix and ARB held ?-Continue Aldactone, SGLT2i, avoid hypotension ?-BP trending up now, restart Lasix ? ?Paroxysmal atrial fibrillation ([facility]) ?History of non sustained ventricular  tachycardia.   ?-Anticoagulation with warfarin, INR is 3.3 ? ?Essential hypertension ?-Developed orthostatic hypotension 3/28, amlodipine and losartan discontinued ?-Continue  spironolactone and metoprolol.   ? ?Iron deficiency anemia ?Iron panel with serum iron at 15, tibc 242, transferrin saturation 6, consistent with iron deficiency. ?-Received IV iron supplementation.  ? ?GERD without esophagitis ?Continue with pantoprazole with good toleration.  ? ?DVT prophylaxis: Warfarin ?Code Status: DNR ?Family Communication: Discussed patient in detail, no family at bedside ?Disposition Plan: CIR pending appeal ?Consultants:  ? ? ?Procedures:  ? ?Antimicrobials:  ? ? ?Objective: ?Vitals:  ? 03/04/22 1953 03/05/22 0006 03/05/22 0500 03/05/22 0800  ?BP: 124/60 (!) 131/52 (!) 129/51 (!) 122/47  ?Pulse: (!) 57 62 (!) 58 64  ?Resp: '20 15 19 18  '$ ?Temp: 98 ?F (36.7 ?C) 98.3 ?F (36.8 ?C) 98.1 ?F (36.7 ?C) 97.9 ?F (36.6 ?C)  ?TempSrc: Oral Oral Oral Oral  ?SpO2: 97% 94% 94% 96%  ?Weight:   58.3 kg   ?Height:      ? ?No intake or output data in the 24 hours ending 03/05/22 1106 ? ?Filed Weights  ? 03/03/22 0307 03/04/22 0500 03/05/22 0500  ?Weight: 55.7 kg 55.2 kg 58.3 kg  ? ? ?Examination: ? ?General exam: Pleasant elderly male laying in bed, AAOx3, no distress ?HEENT: No JVD ?CVS: S1-S2, regular rate rhythm ?Lungs: Clear bilaterally ?Abdomen: Soft, nontender, bowel sounds present  ?Extremities: No edema ?Skin: No rashes ?Psychiatry: Judgement and insight appear normal. Mood & affect appropriate.  ? ? ? ?Data Reviewed:  ? ?CBC: ?Recent Labs  ?Lab 02/27/22 ?1822 02/28/22 ?0058 03/01/22 ?0034 03/03/22 ?0050 03/05/22 ?0940  ?WBC 11.2* 13.3* 10.0 12.6* 8.6  ?NEUTROABS 8.8* 11.0*  --   --   --   ?HGB 10.2* 8.6* 10.9* 10.7* 10.1*  ?HCT 31.8* 25.1*  32.5* 30.9* 29.5*  ?MCV 103.6* 100.0 98.2 98.1 98.7  ?PLT 209 198 192 217 213  ? ?Basic Metabolic Panel: ?Recent Labs  ?Lab 02/28/22 ?0058 03/01/22 ?0034 03/02/22 ?0121 03/03/22 ?0050  03/05/22 ?6063  ?NA 139 140 140 139 138  ?K 3.6 3.5 3.6 4.1 4.2  ?CL 109 105 106 106 106  ?CO2 20* '25 25 23 24  '$ ?GLUCOSE 114* 100* 110* 112* 109*  ?BUN 27* 30* 29* 26* 30*  ?CREATININE 1.32* 1.52* 1.51* 1.41* 1.30*  ?CALCIUM 8.3* 8.4* 8.5* 8.3* 8.4*  ?MG 1.9  --   --   --   --   ? ?GFR: ?Estimated Creatinine Clearance: 31.8 mL/min (A) (by C-G formula based on SCr of 1.3 mg/dL (H)). ?Liver Function Tests: ?Recent Labs  ?Lab 02/28/22 ?0160  ?AST 35  ?ALT 46*  ?ALKPHOS 79  ?BILITOT 0.9  ?PROT 6.0*  ?ALBUMIN 3.0*  ? ?No results for input(s): LIPASE, AMYLASE in the last 168 hours. ?No results for input(s): AMMONIA in the last 168 hours. ?Coagulation Profile: ?Recent Labs  ?Lab 03/01/22 ?0034 03/02/22 ?0121 03/03/22 ?0050 03/04/22 ?0036 03/05/22 ?1093  ?INR 3.3* 3.2* 3.1* 2.5* 2.1*  ? ?Cardiac Enzymes: ?No results for input(s): CKTOTAL, CKMB, CKMBINDEX, TROPONINI in the last 168 hours. ?BNP (last 3 results) ?No results for input(s): PROBNP in the last 8760 hours. ?HbA1C: ?No results for input(s): HGBA1C in the last 72 hours. ?CBG: ?No results for input(s): GLUCAP in the last 168 hours. ?Lipid Profile: ?No results for input(s): CHOL, HDL, LDLCALC, TRIG, CHOLHDL, LDLDIRECT in the last 72 hours. ?Thyroid Function Tests: ?No results for input(s): TSH, T4TOTAL, FREET4, T3FREE, THYROIDAB in the last 72 hours. ?Anemia Panel: ?No results for input(s): VITAMINB12, FOLATE, FERRITIN, TIBC, IRON, RETICCTPCT in the last 72 hours. ? ?Urine analysis: ?   ?Component Value Date/Time  ? COLORURINE YELLOW 12/02/2020 1154  ? APPEARANCEUR CLEAR 12/02/2020 1154  ? LABSPEC 1.006 12/02/2020 1154  ? PHURINE 7.0 12/02/2020 1154  ? GLUCOSEU NEGATIVE 12/02/2020 1154  ? [place] NEGATIVE 01/29/2010 1351  ? [place] NEGATIVE 12/02/2020 1154  ? [place] NEGATIVE 12/02/2020 1154  ? BILIRUBINUR n 08/27/2014 1657  ? [place] NEGATIVE 12/02/2020 1154  ? PROTEINUR NEGATIVE 12/02/2020 1154  ? UROBILINOGEN 1.0 03/06/2015 1446  ? NITRITE NEGATIVE 12/02/2020  1154  ? LEUKOCYTESUR NEGATIVE 12/02/2020 1154  ? ?Sepsis Labs: ?'@LABRCNTIP'$ (procalcitonin:4,lacticidven:4) ? ?) ?Recent Results (from the past 240 hour(s))  ?Resp Panel by RT-PCR (Flu A&B, Covid) Nasopharyngeal Swab     Status: None  ? Collection Time: 02/27/22  6:51 PM  ? Specimen: Nasopharyngeal Swab; Nasopharyngeal(NP) swabs in vial transport medium  ?Result Value Ref Range Status  ? SARS Coronavirus 2 by RT PCR NEGATIVE NEGATIVE Final  ?  Comment: (NOTE) ?SARS-CoV-2 target nucleic acids are NOT DETECTED. ? ?The SARS-CoV-2 RNA is generally detectable in upper respiratory ?specimens during the acute phase of infection. The lowest ?concentration of SARS-CoV-2 viral copies this assay can detect is ?138 copies/mL. A negative result does not preclude SARS-Cov-2 ?infection and should not be used as the sole basis for treatment or ?other patient management decisions. A negative result may occur with  ?improper specimen collection/handling, submission of specimen other ?than nasopharyngeal swab, presence of viral mutation(s) within the ?areas targeted by this assay, and inadequate number of viral ?copies(<138 copies/mL). A negative result must be combined with ?clinical observations, patient history, and epidemiological ?information. The expected result is Negative. ? ?Fact Sheet for Patients:  ?EntrepreneurPulse.com.au ? ?Fact Sheet for Healthcare Providers:  ?IncredibleEmployment.be ? ?  This test is no t yet approved or cleared by the [place] FDA and  ?has been authorized for detection and/or diagnosis of SARS-CoV-2 by ?FDA under an Emergency Use Authorization (EUA). This EUA will remain  ?in effect (meaning this test can be used) for the duration of the ?COVID-19 declaration under Section 564(b)(1) of the Act, 21 ?U.S.C.section 360bbb-3(b)(1), unless the authorization is terminated  ?or revoked sooner.  ? ? ?  ? Influenza A by PCR NEGATIVE NEGATIVE Final  ? Influenza B by PCR  NEGATIVE NEGATIVE Final  ?  Comment: (NOTE) ?The Xpert Xpress SARS-CoV-2/FLU/RSV plus assay is intended as an aid ?in the diagnosis of influenza from Nasopharyngeal swab specimens and ?should not be used as a sole b

## 2022-03-05 NOTE — Plan of Care (Signed)
?  Problem: Education: ?Goal: Ability to demonstrate management of disease process will improve ?Outcome: Progressing ?  ?Problem: Cardiac: ?Goal: Ability to achieve and maintain adequate cardiopulmonary perfusion will improve ?Outcome: Progressing ?  ?Problem: Education: ?Goal: Knowledge of General Education information will improve ?Description: Including pain rating scale, medication(s)/side effects and non-pharmacologic comfort measures ?Outcome: Progressing ?  ?Problem: Clinical Measurements: ?Goal: Cardiovascular complication will be avoided ?Outcome: Progressing ?  ?Problem: Activity: ?Goal: Risk for activity intolerance will decrease ?Outcome: Progressing ?  ?

## 2022-03-06 LAB — BASIC METABOLIC PANEL
Anion gap: 7 (ref 5–15)
BUN: 26 mg/dL — ABNORMAL HIGH (ref 8–23)
CO2: 25 mmol/L (ref 22–32)
Calcium: 8.6 mg/dL — ABNORMAL LOW (ref 8.9–10.3)
Chloride: 106 mmol/L (ref 98–111)
Creatinine, Ser: 1.33 mg/dL — ABNORMAL HIGH (ref 0.61–1.24)
GFR, Estimated: 53 mL/min — ABNORMAL LOW (ref >60)
Glucose, Bld: 99 mg/dL (ref 70–99)
Potassium: 4 mmol/L (ref 3.5–5.1)
Sodium: 138 mmol/L (ref 135–145)

## 2022-03-06 LAB — PROTIME-INR
INR: 2.2 — ABNORMAL HIGH (ref 0.8–1.2)
Prothrombin Time: 24.6 seconds — ABNORMAL HIGH (ref 11.4–15.2)

## 2022-03-06 MED ORDER — WARFARIN SODIUM 4 MG PO TABS
4.0000 mg | ORAL_TABLET | Freq: Every day | ORAL | Status: DC
  Administered 2022-03-06 – 2022-03-07 (×2): 4 mg via ORAL
  Filled 2022-03-06 (×2): qty 1

## 2022-03-06 NOTE — Progress Notes (Signed)
Mobility Specialist Progress Note  ? ? 03/06/22 1306  ?Mobility  ?Activity Ambulated with assistance in hallway  ?Level of Assistance Contact guard assist, steadying assist  ?Assistive Device  ?(hallway railings)  ?Distance Ambulated (ft) 250 ft  ?Activity Response Tolerated well  ?$Mobility charge 1 Mobility  ? ?Pt received in chair and agreeable. No complaints. Had x2 LOB with minA to recover. Pt slightly SOB with exertion. Returned to chair with call bell in reach and family present.  ? ?Jesus [name] ?Mobility Specialist  ?  ?

## 2022-03-06 NOTE — Progress Notes (Signed)
ANTICOAGULATION CONSULT NOTE - Follow Up Consult ? ?Pharmacy Consult for Warfarin ?Indication: TAVR / PAF ? ?Allergies  ?Allergen Reactions  ? Codeine Other (See Comments)  ?  Pt does not remember  ? Iodine Swelling  ? ? ?Patient Measurements: ?Height: '5\' 1"'$  (154.9 cm) ?Weight: 58.5 kg (128 lb 15.5 oz) ?IBW/kg (Calculated) : 52.3 ? ?Vital Signs: ?Temp: 98.4 ?F (36.9 ?C) (04/02 0400) ?Temp Source: Oral (04/02 0800) ?BP: 117/44 (04/02 0800) ?Pulse Rate: 51 (04/02 0800) ? ?Labs: ?Recent Labs  ?  03/04/22 ?0036 03/05/22 ?4097 03/06/22 ?0045  ?HGB  --  10.1*  --   ?HCT  --  29.5*  --   ?PLT  --  213  --   ?LABPROT 26.8* 23.7* 24.6*  ?INR 2.5* 2.1* 2.2*  ?CREATININE  --  1.30* 1.33*  ? ? ? ?Estimated Creatinine Clearance: 31.1 mL/min (A) (by C-G formula based on SCr of 1.33 mg/dL (H)). ? ? ?Assessment: ?On warfarin PTA for Afib- > 4 mg daily  ?INR therapeutic at 2.2.  ?Warfarin held 3/28 and 3/29 to allow INR to drift down. ? ?Goal of Therapy:  ?INR 2-3 ?Monitor platelets by anticoagulation protocol: Yes ?  ?Plan:  ?Warfarin '4mg'$  daily ?Monitor daily INR ? ?Hildred Laser, PharmD ?Clinical Pharmacist ?**Pharmacist phone directory can now be found on [email or web address] ([email or web address]).  Listed under [place]. ? ? ?

## 2022-03-06 NOTE — Plan of Care (Signed)
?  Problem: Activity: Goal: Capacity to carry out activities will improve Outcome: Progressing   Problem: Education: Goal: Knowledge of General Education information will improve Description: Including pain rating scale, medication(s)/side effects and non-pharmacologic comfort measures Outcome: Progressing   Problem: Activity: Goal: Risk for activity intolerance will decrease Outcome: Progressing   

## 2022-03-06 NOTE — Progress Notes (Signed)
Patient seen and examined, no changes from my note yesterday ?-Remains medically stable awaiting CIR appeal ?-Will need short-term rehab if appeal is denied, TOC consult ? ?Domenic Polite, MD ?

## 2022-03-07 DIAGNOSIS — I5023 Acute on chronic systolic (congestive) heart failure: Secondary | ICD-10-CM | POA: Diagnosis not present

## 2022-03-07 LAB — PROTIME-INR
INR: 2 — ABNORMAL HIGH (ref 0.8–1.2)
Prothrombin Time: 23 seconds — ABNORMAL HIGH (ref 11.4–15.2)

## 2022-03-07 LAB — BASIC METABOLIC PANEL
Anion gap: 7 (ref 5–15)
BUN: 33 mg/dL — ABNORMAL HIGH (ref 8–23)
CO2: 25 mmol/L (ref 22–32)
Calcium: 8.8 mg/dL — ABNORMAL LOW (ref 8.9–10.3)
Chloride: 106 mmol/L (ref 98–111)
Creatinine, Ser: 1.69 mg/dL — ABNORMAL HIGH (ref 0.61–1.24)
GFR, Estimated: 40 mL/min — ABNORMAL LOW (ref >60)
Glucose, Bld: 102 mg/dL — ABNORMAL HIGH (ref 70–99)
Potassium: 4.6 mmol/L (ref 3.5–5.1)
Sodium: 138 mmol/L (ref 135–145)

## 2022-03-07 MED ORDER — SPIRONOLACTONE 25 MG PO TABS
25.0000 mg | ORAL_TABLET | Freq: Every day | ORAL | Status: DC
  Administered 2022-03-08: 25 mg via ORAL
  Filled 2022-03-07: qty 1

## 2022-03-07 NOTE — PMR Pre-admission (Signed)
PMR Admission Coordinator Pre-Admission Assessment ? ?Patient: Jesus [name] is an 81 y.o., male ?MRN: [id number] ?DOB: July 29, 1941 ?Height: _0  (154.9 cm) ?Weight: 58.5 kg ? ?Insurance Information ?HMO: yes    PPO:      PCP:      IPA:      80/20:      OTHER:  ?PRIMARY: [employer]      Policy#: [id number]      Subscriber: pt ?CM Name: Abigail Butts    Phone#: [phone]     Fax#: [phone] ?Pre-Cert#:  [id number] Josem Kaufmann for CIR from Abigail Butts with Bernadene Bell following expedited appeal with updates due to fax listed above on day 7      Employer:  ?Benefits:  Phone #: [phone]     Name:  ?Eff. Date: 12/05/21     Deduct: $0      Out of Pocket Max: $4500 (met $161.40)      Life Max: n/a ?CIR: $325/day for days 1-5      SNF: 20 full days ?Outpatient:      Co-Pay: $20/visit ?Home Health: 100%      Co-Pay:  ?DME: 80%  Co-Ins: 20% ?Providers:  ?SECONDARY:       Policy#:      Phone#:  ? ?Financial Counselor:       Phone#:  ? ?The ?Data Collection Information Summary? for patients in Inpatient Rehabilitation Facilities with attached ?Privacy Act [place] Records? was provided and verbally reviewed with: Patient ? ?Emergency Contact Information ?Contact Information   ? ? Name Relation Home Work Mobile  ? Apple,Rhonda Niece   [phone]  ? SILER,Mike Relative [phone]    ? Peterson,Anita Niece   [phone]  ? ?  ? ? ?Current Medical History  ?Patient Admitting Diagnosis: cardiac debility ? ?History of Present Illness: Pt is a 81 y/o male with pMH of CAD, CABG, AS, TAVR, chrnoic systolic CHF, HTN, paroxysmal a-fib (on coumadin), recently hospitalized on 3/14 with NSTEMI.  Pt presented to [facility] on 3/26 with increased dyspnea and lower extremity edema.  Chest xray noted pleural effusion and pulmonary edema,  BP 1033.  Pt was diuresed with IV lasix and recommended to continue low dose at discharge.  Troponins mildly elevated, thought to be due to CHF.  Pt with recent PTCA/stent of SVG to PDA.  Mild bump in  creatinine with diuresis.  Lasix held.  Therapy ongoing and pt was recommended for CIR.    ?  ? ?Patient's medical record from [facility] has been reviewed by the rehabilitation admission coordinator and physician. ? ?Past Medical History  ?Past Medical History:  ?Diagnosis Date  ? 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 Aortic stenosis   ? mild with a mean aortic valve gradient of 12 mmHg  ? Atrial fibrillation ([place])   ? holding sinus rhythm on Amiodarone  ? CAD (coronary artery disease)   ? a. S/P Ant MI 1980;  b. 1997 S/P CABG x 8 (LIMA to diag-LAD, SVG to OM1-OM2-OM3, SVG to [facility] - Dr [name]);  c. 01/2015 Cath: 3VD, 8/8 patent grafts.  ? Cardiomyopathy   ? Dysrhythmia   ? Esophageal reflux   ? GERD (gastroesophageal reflux disease)   ? Headache   ? History of colonoscopy   ? History of transesophageal echocardiography (TEE) for monitoring   ? Other and unspecified hyperlipidemia   ? Paroxysmal atrial fibrillation ([place]) 02/20/2022  ? S/P angioplasty with stent 02/17/22 DES to proximal VG to PDA 02/20/2022  ?  S/P TAVR (transcatheter aortic valve replacement)   ? a. 03/2015 26 mm Edwards Sapien 3 transcatheter heart valve placed via open left transfemoral approach.  ? Severe aortic stenosis 08/10/2012  ? Skin lesions, generalized   ? facial which may represent actinic keratoses and possible photosensitivity from Amiodarone  ? Unspecified essential hypertension   ? ? ?Has the patient had major surgery during 100 days prior to admission? No ? ?Family History   ?family history includes Atrial fibrillation in his brother; Crohn's disease in his mother; Heart attack (age of onset: 9) in his father; Heart disease in his father and paternal uncle; Heart failure in his father; Prostate cancer in his paternal uncle; Stroke (age of onset: 66) in his mother. ? ?Current Medications ? ?Current Facility-Administered Medications:  ?  acetaminophen (TYLENOL) tablet 650 mg, 650 mg, Oral, Q6H PRN, 650 mg at  03/02/22 1015 **OR** acetaminophen (TYLENOL) suppository 650 mg, 650 mg, Rectal, Q6H PRN, Shalhoub, Sherryll Burger, MD ?  albuterol (PROVENTIL) (2.5 MG/3ML) 0.083% nebulizer solution 3 mL, 3 mL, Inhalation, Q4H PRN, Shalhoub, Sherryll Burger, MD ?  amiodarone (PACERONE) tablet 400 mg, 400 mg, Oral, Daily, Shalhoub, Sherryll Burger, MD, 400 mg at 03/07/22 0948 ?  atorvastatin (LIPITOR) tablet 80 mg, 80 mg, Oral, Daily, Shalhoub, Sherryll Burger, MD, 80 mg at 03/07/22 0948 ?  clopidogrel (PLAVIX) tablet 75 mg, 75 mg, Oral, Daily, Shalhoub, Sherryll Burger, MD, 75 mg at 03/07/22 0948 ?  empagliflozin (JARDIANCE) tablet 10 mg, 10 mg, Oral, Daily, Arrien, Jimmy Picket, MD, 10 mg at 03/07/22 0948 ?  HYDROcodone-acetaminophen (NORCO/VICODIN) 5-325 MG per tablet 1 tablet, 1 tablet, Oral, Q6H PRN, Domenic Polite, MD, 1 tablet at 03/02/22 1347 ?  loratadine (CLARITIN) tablet 10 mg, 10 mg, Oral, Daily PRN, Shalhoub, Sherryll Burger, MD ?  melatonin tablet 10 mg, 10 mg, Oral, QHS PRN, Shalhoub, Sherryll Burger, MD ?  metoprolol succinate (TOPROL-XL) 24 hr tablet 25 mg, 25 mg, Oral, Daily, Domenic Polite, MD, 25 mg at 03/07/22 8756 ?  multivitamin with minerals tablet 1 tablet, 1 tablet, Oral, Daily, Shalhoub, Sherryll Burger, MD, 1 tablet at 03/07/22 4332 ?  nitroGLYCERIN (NITROSTAT) SL tablet 0.4 mg, 0.4 mg, Sublingual, Q5 min PRN, Shalhoub, Sherryll Burger, MD ?  ondansetron (ZOFRAN) tablet 4 mg, 4 mg, Oral, Q6H PRN **OR** ondansetron (ZOFRAN) injection 4 mg, 4 mg, Intravenous, Q6H PRN, Shalhoub, Sherryll Burger, MD ?  pantoprazole (PROTONIX) EC tablet 40 mg, 40 mg, Oral, Daily, Shalhoub, Sherryll Burger, MD, 40 mg at 03/07/22 0948 ?  polyethylene glycol (MIRALAX / GLYCOLAX) packet 17 g, 17 g, Oral, Daily PRN, Shalhoub, Sherryll Burger, MD ?  Derrill Memo ON 03/08/2022] spironolactone (ALDACTONE) tablet 25 mg, 25 mg, Oral, Daily, Domenic Polite, MD ?  warfarin (COUMADIN) tablet 4 mg, 4 mg, Oral, q1600, Kris Mouton, RPH, 4 mg at 03/06/22 1623 ?  Warfarin - Pharmacist Dosing Inpatient, , Does not apply,  q1600, Shalhoub, Sherryll Burger, MD, Given at 03/04/22 [phone] ? ?Patients Current Diet:  ?Diet Order   ? ?       ?  Diet - low sodium heart healthy       ?  ?  Diet Heart Room service appropriate? Yes; Fluid consistency: Thin  Diet effective now       ?  ? ?  ?  ? ?  ? ? ?Precautions / Restrictions ?Precautions ?Precautions: Fall ?Precaution Comments: BP ?Restrictions ?Weight Bearing Restrictions: No  ? ?Has the patient had 2 or more falls or a fall  with injury in the past year? No ? 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 ?Prior Functional Level ?Self Care: Did the patient need help bathing, dressing, using the toilet or eating? Independent ? ?Indoor Mobility: Did the patient need assistance with walking from room to room (with or without device)? Independent ? ?Stairs: Did the patient need assistance with internal or external stairs (with or without device)? Independent ? ?Functional Cognition: Did the patient need help planning regular tasks such as shopping or remembering to take medications? Independent ? ?Patient Information ?Are you of Hispanic, Latino/a,or Spanish origin?: A. No, not of Hispanic, Latino/a, or Spanish origin ?What is your race?: A. White ?Do you need or want an interpreter to communicate with a doctor or health care staff?: 0. No ? ?Patient's Response To:  ?Health Literacy and Transportation ?Is the patient able to respond to health literacy and transportation needs?: Yes ?Health Literacy - How often do you need to have someone help you when you read instructions, pamphlets, or other written material from your doctor or pharmacy?: Never ?In the past 12 months, has lack of transportation kept you from medical appointments or from getting medications?: No ?In the past 12 months, has lack of transportation kept you from meetings, work, or from getting things needed for daily living?: No ? ?Home Assistive Devices /  Equipment ?Home Equipment: Conservation officer, nature (2 wheels), [employer] - single point, Shower seat ? ?Prior Device Use: Indicate devices/aids used by the patient prior to current illness, exacerbation or injury? None of the above ? ?[place]

## 2022-03-07 NOTE — Progress Notes (Signed)
Inpatient Rehab Admissions Coordinator:  ? ?Awaiting determination from [facility] regarding expedited appeal request.  Will continue to follow.  ? ?Shann Medal, PT, DPT ?Admissions Coordinator ?[phone] ?03/07/22  ?11:31 AM ? ?

## 2022-03-07 NOTE — H&P (Signed)
? ? ?Physical Medicine and Rehabilitation Admission H&P ? ?  ?Chief Complaint  ?Patient presents with  ? Debility  ? ? ?HPI: Jesus [name] is an 81 year old male with history of PAF- on coumadin, TVAR, HTN, CAD s/p CABG, recent admission 02/15/22 with CP due to NSTEMI and underwent PTCA/DES of SVG to PDA. He was readmitted on 02/27/22 with progressive SOB with peripheral edema and bilateral pleural effusions due to fluid overalod. He was treated with IV diuresis with improvement in symptoms but developed acute on chronic renal failure with orthostatic hypotension. Amlodipine and losartan were discontinued and diuretics placed on hold. Iron deficiency anemia treated with IV iron supplementation. Orthostatic symptoms/leg pain improving with BP trending upwards therefore lasix resumed on 04/01 but he did have recurrent rise in BUN/SCr 1.6 so lasix was held X 1 and resumed at 20 mg daily today. Dizziness resolving with improvement in respiratory status but he continues to be limited by weakness with balance deficits. CIR recommended due to functional decline.  ? ? ?Review of Systems  ?Constitutional:  Negative for chills and fever.  ?HENT:  Negative for hearing loss and tinnitus.   ?Eyes:  Negative for double vision.  ?Respiratory:  Positive for shortness of breath. Negative for cough.   ?Cardiovascular:  Negative for leg swelling.  ?Gastrointestinal:  Negative for nausea and vomiting.  ?Genitourinary:  Negative for dysuria and urgency.  ?Musculoskeletal:  Negative for myalgias.  ?Neurological:  Positive for weakness. Negative for sensory change and speech change.  ?Psychiatric/Behavioral:  Negative for depression and suicidal ideas.   ? ? ?Past Medical History:  ?Diagnosis Date  ? Acute myocardial infarction of inferior wall ([place]) 1980  ? Aortic stenosis   ? mild with a mean aortic valve gradient of 12 mmHg  ? Atrial fibrillation ([place])   ? holding sinus rhythm on Amiodarone  ? CAD (coronary artery disease)   ? a.  S/P Ant MI 1980;  b. 1997 S/P CABG x 8 (LIMA to diag-LAD, SVG to OM1-OM2-OM3, SVG to [facility] - Dr [name]);  c. 01/2015 Cath: 3VD, 8/8 patent grafts.  ? Cardiomyopathy   ? Dysrhythmia   ? Esophageal reflux   ? GERD (gastroesophageal reflux disease)   ? Headache   ? History of colonoscopy   ? History of transesophageal echocardiography (TEE) for monitoring   ? Other and unspecified hyperlipidemia   ? Paroxysmal atrial fibrillation ([place]) 02/20/2022  ? S/P angioplasty with stent 02/17/22 DES to proximal VG to PDA 02/20/2022  ? S/P TAVR (transcatheter aortic valve replacement)   ? a. 03/2015 26 mm Edwards Sapien 3 transcatheter heart valve placed via open left transfemoral approach.  ? Severe aortic stenosis 08/10/2012  ? Skin lesions, generalized   ? facial which may represent actinic keratoses and possible photosensitivity from Amiodarone  ? Unspecified essential hypertension   ? ? ?Past Surgical History:  ?Procedure Laterality Date  ? CARDIAC CATHETERIZATION    ? CARDIOVERSION  11/18/2006  ? Dr. [name]  ? CATARACT EXTRACTION W/ INTRAOCULAR LENS IMPLANT  April '13  (Dr. [name])  ? left eye only  ? CHOLECYSTECTOMY    ? CORONARY ARTERY BYPASS GRAFT  02/09/1996  ? LIMA to diag-LAD, SVG to OM1-OM2-OM3, SVG to [facility]  ? CORONARY STENT INTERVENTION N/A 02/17/2022  ? Procedure: CORONARY STENT INTERVENTION;  Surgeon: Burnell Blanks, MD;  Location: [place] CV LAB;  Service: Cardiovascular;  Laterality: N/A;  ? CORONARY/GRAFT ANGIOGRAPHY N/A 02/17/2022  ? Procedure: CORONARY/GRAFT ANGIOGRAPHY;  Surgeon: Angelena Form,  Annita Brod, MD;  Location: [place] CV LAB;  Service: Cardiovascular;  Laterality: N/A;  ? EYE SURGERY    ? LEFT AND RIGHT HEART CATHETERIZATION WITH CORONARY/GRAFT ANGIOGRAM N/A 01/26/2015  ? Procedure: LEFT AND RIGHT HEART CATHETERIZATION WITH [name];  Surgeon: [name], MD;  Location: [facility] CATH LAB;  Service: Cardiovascular;  Laterality: N/A;  ? TEE WITHOUT CARDIOVERSION  N/A 03/10/2015  ? Procedure: TRANSESOPHAGEAL ECHOCARDIOGRAM (TEE);  Surgeon: [name], MD;  Location: [place];  Service: Open Heart Surgery;  Laterality: N/A;  ? TONSILLECTOMY    ? TRANSCATHETER AORTIC VALVE REPLACEMENT, TRANSFEMORAL N/A 03/10/2015  ? Procedure: TRANSCATHETER AORTIC VALVE REPLACEMENT, TRANSFEMORAL;  Surgeon: [name], MD;  Location: [place];  Service: Open Heart Surgery;  Laterality: N/A;  ? ? ?Family History  ?Problem Relation Age of Onset  ? Stroke Mother 5  ?     Deceased  ? Crohn's disease Mother   ?     Deceased  ? Heart attack Father 15  ?     Deceased  ? Heart disease Father   ? Heart failure Father   ?     Deceased  ? Atrial fibrillation Brother   ? Heart disease Paternal Uncle   ? Prostate cancer Paternal Uncle   ? Colon cancer Neg Hx   ? ? ?Social History:  reports that he has never smoked. He has never used smokeless tobacco. He reports that he does not drink alcohol and does not use drugs. ? ?  ?Allergies  ?Allergen Reactions  ? Codeine Other (See Comments)  ?  Pt does not remember  ? Iodine Swelling  ? ? ?Medications Prior to Admission  ?Medication Sig Dispense Refill  ? albuterol (VENTOLIN HFA) 108 (90 Base) MCG/ACT inhaler INHALE 2 PUFFS BY MOUTH EVERY 4 HOURS ASNEEDED FOR WHEEZING OR SHORTNESS OF BREATH. (Patient taking differently: 2 puffs every 4 (four) hours as needed for wheezing or shortness of breath.) 6.7 g 1  ? amiodarone (PACERONE) 200 MG tablet Take 2 tablets (400 mg total) by mouth 2 (two) times daily. Take 2 tabs ( 400 mg total) twice a day for 7 days then take 400 mg daily. (Patient taking differently: Take 400 mg by mouth daily.) 80 tablet 3  ? amLODipine (NORVASC) 10 MG tablet Take 1 tablet (10 mg total) by mouth daily. 30 tablet 6  ? amoxicillin (AMOXIL) 400 MG/5ML suspension TAKE 5 TEASPOONFULS BY MOUTH 1 HOUR PRIOR TO DENTAL PROCEDURE (Patient taking differently: Take 2,000 mg by mouth See admin instructions. TAKE 5 TEASPOONFULS BY MOUTH 1 HOUR PRIOR TO DENTAL  PROCEDURE) 100 mL 0  ? Ascorbic Acid (VITAMIN C) 1000 MG tablet Take 1,000 mg by mouth daily.    ? aspirin 81 MG tablet Take 81 mg by mouth at bedtime.    ? atorvastatin (LIPITOR) 80 MG tablet Take 1 tablet (80 mg total) by mouth daily. 30 tablet 6  ? clopidogrel (PLAVIX) 75 MG tablet Take 1 tablet (75 mg total) by mouth daily with breakfast. 30 tablet 11  ? diclofenac Sodium (VOLTAREN) 1 % GEL Apply 4 g topically 4 (four) times daily. 100 g 0  ? loratadine (CLARITIN) 10 MG tablet Take 10 mg by mouth daily as needed for allergies.    ? losartan (COZAAR) 50 MG tablet Take 1 tablet (50 mg total) by mouth daily. 30 tablet 6  ? metoprolol tartrate (LOPRESSOR) 25 MG tablet Take 1 tablet (25 mg total) by mouth 2 (two) times daily. [place]  tablet 6  ? Multiple Vitamins-Minerals (MULTIVITAMIN,TX-MINERALS) tablet Take 1 tablet by mouth daily.    ? nitroGLYCERIN (NITROSTAT) 0.4 MG SL tablet Place 1 tablet (0.4 mg total) under the tongue every 5 (five) minutes x 3 doses as needed for chest pain. (Patient taking differently: Place 0.4 mg under the tongue every 5 (five) minutes as needed for chest pain.) 25 tablet 4  ? warfarin (COUMADIN) 4 MG tablet TAKE 1 TABLET BY MOUTH DAILY OR TAKE AS DIRECTED BY ANTICOAGULATION CLINIC (Patient taking differently: Take 4 mg by mouth every evening.) 105 tablet 1  ? ? ? ? ?Home: ?Home Living ?Family/patient expects to be discharged to:: Private residence ?Living Arrangements: Alone ?Available Help at Discharge: Family, Available 24 hours/day ?Type of Home: House ?Home Access: Stairs to enter ?Entrance Stairs-Number of Steps: 4 ?Entrance Stairs-Rails: Right, Left, Can reach both ?Home Layout: One level ?Bathroom Shower/Tub: Walk-in shower ?Bathroom Toilet: Standard ?Home Equipment: Conservation officer, nature (2 wheels), [employer] - single point, Shower seat ?Additional Comments: pt plans to have assist from brother's wife. Pt's wife passed June 2022 ?  ?Functional History: ?Prior Function ?Prior Level of Function :  Independent/Modified Independent, Working/employed, Driving ?Mobility Comments: pt reports gym 3x/week, working for [name]. had been living alone prior to initial admission ?ADLs Comments: pt report

## 2022-03-07 NOTE — Progress Notes (Signed)
?PROGRESS NOTE ? ? ? ?Jesus [name]  ZOX:[id number] DOB: 11/05/1941 DOA: 02/27/2022 ?PCP: [name], MD  ?Narrative [name] with history of CAD, CABG, AS, TAVR, chronic systolic CHF, hypertension, paroxysmal A-fib on Coumadin was recently hospitalized with NSTEMI from 3/14-19, cath noted severe native vessel CAD s/p 6V CABG with 6/6 patent grafts, severe stenosis in the proximal body of the SVG to the PDA-Successful PTCA/DES x 1 proximal body of SVG to PDA. ?-Presented to the ED 3/26 with increase dyspnea on exertion and lower extremity edema, chest x-ray noted pleural effusion, pulmonary edema, BNP 1033 ? ?Subjective: ?-Feels better overall, no dizziness today, breathing has improved, leg pains have improved ? ?Assessment & Plan: ? ?Acute on chronic systolic congestive heart failure ([place]) ?Acute hypoxic respiratory failure ?-Recent echo 3/15 echocardiogram with reduced LV systolic function with EF 35 to 40%, with inferior and inferior lateral hypokinesis. Preserved RV systolic function. Left atrium with severe dilatation, no significant valvular disease.  (SP TAVR).  ?-Diuresed with IV Lasix, he is 3.4 L negative ?-Improving continue current regimen of Toprol, Aldactone, empagliflozin, ?-Hold Lasix today with mild bump in creatinine, resume at low-dose at DC ?-Discharge planning, medically stable for CIR -awaiting insurance appeal, will need SNF if CIR declined ? ?Coronary artery disease involving coronary bypass graft of native heart without angina pectoris ?-Minimally elevated high-sensitivity troponin in the setting of CHF ?-Recent PTCA/stenting of SVG to PDA ?-Continue Plavix, metoprolol, statin, warfarin ? ?Acute kidney injury superimposed on chronic kidney disease ([place]) ?CKD stage 2. ?-Creatinine gradually worsened to 1.5, was improving, now mild bump in creatinine noted, hold Lasix today ?-ARB on hold ? ?Paroxysmal atrial fibrillation ([facility]) ?History of non sustained ventricular tachycardia.    ?-Anticoagulation with warfarin, INR is 3.3 ? ?Essential hypertension ?-Developed orthostatic hypotension 3/28, amlodipine and losartan discontinued ?-Continue  spironolactone and metoprolol.   ? ?Iron deficiency anemia ?Iron panel with serum iron at 15, tibc 242, transferrin saturation 6, consistent with iron deficiency. ?-Received IV iron supplementation.  ? ?GERD without esophagitis ?Continue with pantoprazole with good toleration.  ? ?DVT prophylaxis: Warfarin ?Code Status: DNR ?Family Communication: Discussed patient in detail, no family at bedside ?Disposition Plan: CIR pending appeal versus SNF ?Consultants:  ? ? ?Procedures:  ? ?Antimicrobials:  ? ? ?Objective: ?Vitals:  ? 03/06/22 2014 03/06/22 2306 03/07/22 0455 03/07/22 0757  ?BP: (!) 135/54 (!) 123/45 (!) 129/42 (!) 127/56  ?Pulse: 60 60 (!) 54 60  ?Resp: '12 20 19 14  '$ ?Temp: 98.1 ?F (36.7 ?C) 98 ?F (36.7 ?C) 98.3 ?F (36.8 ?C) 97.6 ?F (36.4 ?C)  ?TempSrc: Oral Oral Oral Oral  ?SpO2: 99% 95% 100% 97%  ?Weight:      ?Height:      ? ? ?Intake/Output Summary (Last 24 hours) at 03/07/2022 1121 ?Last data filed at 03/06/2022 1600 ?Gross per 24 hour  ?Intake 720 ml  ?Output 400 ml  ?Net 320 ml  ? ? ?Filed Weights  ? 03/04/22 0500 03/05/22 0500 03/06/22 0400  ?Weight: 55.2 kg 58.3 kg 58.5 kg  ? ? ?Examination: ? ?General exam: Pleasant elderly male sitting up in bed, AAOx3, no distress ?HEENT: No JVD ?CVS: S1-S2, regular rhythm ?Lungs: Clear bilaterally ?Abdomen: Soft, nontender, bowel sounds present ?Extremities: No edema  ?Skin: No rashes ?Psychiatry: Judgement and insight appear normal. Mood & affect appropriate.  ? ? ? ?Data Reviewed:  ? ?CBC: ?Recent Labs  ?Lab 03/01/22 ?0034 03/03/22 ?0050 03/05/22 ?8119  ?WBC 10.0 12.6* 8.6  ?HGB 10.9* 10.7* 10.1*  ?  HCT 32.5* 30.9* 29.5*  ?MCV 98.2 98.1 98.7  ?PLT 192 217 213  ? ?Basic Metabolic Panel: ?Recent Labs  ?Lab 03/02/22 ?0121 03/03/22 ?0050 03/05/22 ?6010 03/06/22 ?0045 03/07/22 ?9323  ?NA 140 139 138 138 138  ?K  3.6 4.1 4.2 4.0 4.6  ?CL 106 106 106 106 106  ?CO2 '25 23 24 25 25  '$ ?GLUCOSE 110* 112* 109* 99 102*  ?BUN 29* 26* 30* 26* 33*  ?CREATININE 1.51* 1.41* 1.30* 1.33* 1.69*  ?CALCIUM 8.5* 8.3* 8.4* 8.6* 8.8*  ? ?GFR: ?Estimated Creatinine Clearance: 24.5 mL/min (A) (by C-G formula based on SCr of 1.69 mg/dL (H)). ?Liver Function Tests: ?No results for input(s): AST, ALT, ALKPHOS, BILITOT, PROT, ALBUMIN in the last 168 hours. ? ?No results for input(s): LIPASE, AMYLASE in the last 168 hours. ?No results for input(s): AMMONIA in the last 168 hours. ?Coagulation Profile: ?Recent Labs  ?Lab 03/03/22 ?0050 03/04/22 ?0036 03/05/22 ?5573 03/06/22 ?0045 03/07/22 ?2202  ?INR 3.1* 2.5* 2.1* 2.2* 2.0*  ? ?Cardiac Enzymes: ?No results for input(s): CKTOTAL, CKMB, CKMBINDEX, TROPONINI in the last 168 hours. ?BNP (last 3 results) ?No results for input(s): PROBNP in the last 8760 hours. ?HbA1C: ?No results for input(s): HGBA1C in the last 72 hours. ?CBG: ?No results for input(s): GLUCAP in the last 168 hours. ?Lipid Profile: ?No results for input(s): CHOL, HDL, LDLCALC, TRIG, CHOLHDL, LDLDIRECT in the last 72 hours. ?Thyroid Function Tests: ?No results for input(s): TSH, T4TOTAL, FREET4, T3FREE, THYROIDAB in the last 72 hours. ?Anemia Panel: ?No results for input(s): VITAMINB12, FOLATE, FERRITIN, TIBC, IRON, RETICCTPCT in the last 72 hours. ? ?Urine analysis: ?   ?Component Value Date/Time  ? COLORURINE YELLOW 12/02/2020 1154  ? APPEARANCEUR CLEAR 12/02/2020 1154  ? LABSPEC 1.006 12/02/2020 1154  ? PHURINE 7.0 12/02/2020 1154  ? GLUCOSEU NEGATIVE 12/02/2020 1154  ? [place] NEGATIVE 01/29/2010 1351  ? [place] NEGATIVE 12/02/2020 1154  ? [place] NEGATIVE 12/02/2020 1154  ? BILIRUBINUR n 08/27/2014 1657  ? [place] NEGATIVE 12/02/2020 1154  ? PROTEINUR NEGATIVE 12/02/2020 1154  ? UROBILINOGEN 1.0 03/06/2015 1446  ? NITRITE NEGATIVE 12/02/2020 1154  ? LEUKOCYTESUR NEGATIVE 12/02/2020 1154  ? ?Sepsis  Labs: ?'@LABRCNTIP'$ (procalcitonin:4,lacticidven:4) ? ?) ?Recent Results (from the past 240 hour(s))  ?Resp Panel by RT-PCR (Flu A&B, Covid) Nasopharyngeal Swab     Status: None  ? Collection Time: 02/27/22  6:51 PM  ? Specimen: Nasopharyngeal Swab; Nasopharyngeal(NP) swabs in vial transport medium  ?Result Value Ref Range Status  ? SARS Coronavirus 2 by RT PCR NEGATIVE NEGATIVE Final  ?  Comment: (NOTE) ?SARS-CoV-2 target nucleic acids are NOT DETECTED. ? ?The SARS-CoV-2 RNA is generally detectable in upper respiratory ?specimens during the acute phase of infection. The lowest ?concentration of SARS-CoV-2 viral copies this assay can detect is ?138 copies/mL. A negative result does not preclude SARS-Cov-2 ?infection and should not be used as the sole basis for treatment or ?other patient management decisions. A negative result may occur with  ?improper specimen collection/handling, submission of specimen other ?than nasopharyngeal swab, presence of viral mutation(s) within the ?areas targeted by this assay, and inadequate number of viral ?copies(<138 copies/mL). A negative result must be combined with ?clinical observations, patient history, and epidemiological ?information. The expected result is Negative. ? ?Fact Sheet for Patients:  ?EntrepreneurPulse.com.au ? ?Fact Sheet for Healthcare Providers:  ?IncredibleEmployment.be ? ?This test is no t yet approved or cleared by the [place] FDA and  ?has been authorized for detection and/or diagnosis of SARS-CoV-2 by ?FDA  under an Emergency Use Authorization (EUA). This EUA will remain  ?in effect (meaning this test can be used) for the duration of the ?COVID-19 declaration under Section 564(b)(1) of the Act, 21 ?U.S.C.section 360bbb-3(b)(1), unless the authorization is terminated  ?or revoked sooner.  ? ? ?  ? Influenza A by PCR NEGATIVE NEGATIVE Final  ? Influenza B by PCR NEGATIVE NEGATIVE Final  ?  Comment: (NOTE) ?The Xpert  Xpress SARS-CoV-2/FLU/RSV plus assay is intended as an aid ?in the diagnosis of influenza from Nasopharyngeal swab specimens and ?should not be used as a sole basis for treatment. Nasal washings and ?aspirates are unacceptable for Xpert Xpress SARS-CoV-

## 2022-03-07 NOTE — TOC Progression Note (Signed)
Transition of Care (TOC) - Progression Note  ? ? ?Patient Details  ?Name: Jesus [name] ?MRN: [id number] ?Date of Birth: 1941/03/08 ? ?Transition of Care (TOC) CM/SW Contact  ?Angelita Ingles, RN ?Phone [phone] ? ?03/07/2022, 1:06 PM ? ?Clinical Narrative:    ?TOC acknowledges new consult - IF CIR appeal gets denied, will need ST rehab( family mentioned [facility]AP[facility]) DW has been made aware and will follow.  ? ? ?Expected Discharge Plan: [place] ?Barriers to Discharge: Continued Medical Work up ? ?Expected Discharge Plan and Services ?Expected Discharge Plan: [place] ?In-house Referral: NA ?Discharge Planning Services: NA ?Post Acute Care Choice: NA ?Living arrangements for the past 2 months: [place] ?Expected Discharge Date: 03/03/22               ?DME Arranged: N/A ?DME Agency: NA ?  ?  ?  ?HH Arranged: NA ?[place] Agency: NA ?  ?  ?  ? ? ?Social Determinants of Health (SDOH) Interventions ?Food Insecurity Interventions: Intervention Not Indicated ?Financial Strain Interventions: Intervention Not Indicated ?Housing Interventions: Intervention Not Indicated ?Transportation Interventions: Intervention Not Indicated ? ?Readmission Risk Interventions ? ?  03/04/2022  ?  3:49 PM  ?Readmission Risk Prevention Plan  ?Transportation Screening Complete  ?Medication Review Press photographer) Complete  ?PCP or Specialist appointment within 3-5 days of discharge Complete  ?[place] or Home Care Consult Complete  ?SW Recovery Care/Counseling Consult Complete  ?Palliative Care Screening Not Applicable  ?Skilled Nursing Facility Complete  ? ? ?

## 2022-03-07 NOTE — Progress Notes (Signed)
Heart Failure Stewardship Pharmacist Progress Note ? ? ?PCP: [name], MD ?PCP-Cardiologist: [name], MD  ? ? ?HPI:  ?81 yo M with PMH of CAD s/p CABG, CHF, afib, aortic stenosis s/p TAVR, HLD, and HTN. He presented to the ED on 3/26 with shortness of breath and LE edema. CXR with possible mild interstitial edema and small bilateral pleural effusions. His last ECHO from 3/15 with LVEF 35-40% and G2DD. Pending CIR admission. ? ?Current HF Medications: ?Beta Blocker: metoprolol XL 25 mg daily ?Aldosterone Antagonist: spironolactone 25 mg daily ?SGLT2i: Jardiance 10 mg daily ? ?Prior to admission HF Medications: ?Beta blocker: metoprolol tartrate 25 mg BID ?ACE/ARB/ARNI: losartan 50 mg daily ? ?Pertinent Lab Values: ?Serum creatinine 1.69, BUN 33, Potassium 4.6, Sodium 138, BNP 1033.5, A1c 5.9 ?TSAT 6 ? ?Vital Signs: ?Weight: 128 lbs (admission weight: 132 lbs) ?Blood pressure: 130/50s  ?Heart rate: 50-60s  ?I/O: -1L yesterday; net -3.2L ? ?Medication Assistance / Insurance Benefits Check: ?Does the patient have prescription insurance?  Yes ?Type of insurance plan: [facility] Medicare ? ?Outpatient Pharmacy:  ?[facility] ?Is the patient willing to use [facility] at discharge? Yes ?Is the patient willing to transition their outpatient pharmacy to utilize a [facility] outpatient pharmacy?   Pending ?  ? ?Assessment: ?1. Acute on chronic systolic CHF (EF 35-32%), due to ICM. NYHA class II symptoms. ?- No IV diuretics ?- Continue metoprolol XL 25 mg daily - dose reduced from tartrate ok with orthostatic hypotension ?- Losartan on hold with orthostatic hypotension ?- Continue spironolactone 25 mg daily ? - Continue Jardiance 10 mg daily ?- Agree with stopping amlodipine with reduced LVEF ?- IV iron x 1 given on 3/28 ? ?Plan: ?1) Medication changes recommended at this time: ?- Continue current regimen ? ?2) Patient assistance: ?- Entresto copay $47 ?- Jardiance copay $47 ? ?3)   Education  ?- To be completed prior to discharge ? ?Kerby Nora, PharmD, BCPS ?Heart Failure Stewardship Pharmacist ?Phone [phone] ? ? ?

## 2022-03-07 NOTE — Plan of Care (Signed)

## 2022-03-07 NOTE — Progress Notes (Signed)
Mobility Specialist Progress Note  ? ? 03/07/22 1034  ?Mobility  ?Activity Ambulated with assistance in hallway  ?Level of Assistance Minimal assist, patient does 75% or more  ?Assistive Device  ?(HHA)  ?Distance Ambulated (ft) 250 ft  ?Activity Response Tolerated well  ?$Mobility charge 1 Mobility  ? ?Pt received in chair and agreeable. No complaints. Had x2 LOB with minA to recover. Left in BR to attempt BM and advised to pull string when done. RN aware.  ? ?Hildred Alamin ?Mobility Specialist  ?  ?

## 2022-03-07 NOTE — Progress Notes (Signed)
ANTICOAGULATION CONSULT NOTE - Follow Up Consult ? ?Pharmacy Consult for Warfarin ?Indication: TAVR / PAF ? ?Allergies  ?Allergen Reactions  ? Codeine Other (See Comments)  ?  Pt does not remember  ? Iodine Swelling  ? ? ?Patient Measurements: ?Height: '5\' 1"'$  (154.9 cm) ?Weight: 58.5 kg (128 lb 15.5 oz) ?IBW/kg (Calculated) : 52.3 ? ?Vital Signs: ?Temp: 97.6 ?F (36.4 ?C) (04/03 0757) ?Temp Source: Oral (04/03 0757) ?BP: 127/56 (04/03 0757) ?Pulse Rate: 60 (04/03 0757) ? ?Labs: ?Recent Labs  ?  03/05/22 ?4034 03/06/22 ?0045 03/07/22 ?7425  ?HGB 10.1*  --   --   ?HCT 29.5*  --   --   ?PLT 213  --   --   ?LABPROT 23.7* 24.6* 23.0*  ?INR 2.1* 2.2* 2.0*  ?CREATININE 1.30* 1.33* 1.69*  ? ? ? ?Estimated Creatinine Clearance: 24.5 mL/min (A) (by C-G formula based on SCr of 1.69 mg/dL (H)). ? ? ?Assessment: ?On warfarin PTA for Afib- > 4 mg daily  ?INR therapeutic at 2 today.  ?Warfarin held 3/28 and 3/29 to allow INR to drift down. ? ?Goal of Therapy:  ?INR 2-3 ?Monitor platelets by anticoagulation protocol: Yes ?  ?Plan:  ?Warfarin '4mg'$  daily ?Monitor daily INR ? ?[place] Crane, Pharm D, BCPS, BCCP ?Clinical Pharmacist ? 03/07/2022 8:13 AM  ? ?[facility] [place] pharmacy phone numbers are listed on [email or web address] ? ? ? ?

## 2022-03-07 NOTE — Progress Notes (Signed)
Physical Therapy Treatment ?Patient Details ?Name: Jesus [name] ?MRN: [id number] ?DOB: 1941/05/15 ?Today's Date: 03/07/2022 ? ? ?History of Present Illness The pt is an 81 yo male presenting 3/26 with SOB. Pt recently here for bilateral LE edema on 3/23 and DVT was ruled out so he was d/c home on 3/24. This admission pt found to have pulmonary edema and bilateral pleural effusions. PMH includes: CAD s/p CABG, NSTEMI 02/20/22 s/p cardiac cath with stent placement, ischemic cardiomyopathy, afib on coumadin, aortic stenosis s/p TAVR 2016, HLD, and HTN. ? ?  ?PT Comments  ? ? Pt agreeable to therapy this afternoon. Requires min physical assist and min-mod cuing for safety with mobilization without AD including stair training. D/c plans remain appropriate at this time. PT will continue to follow acutely.   ?Recommendations for follow up therapy are one component of a multi-disciplinary discharge planning process, led by the attending physician.  Recommendations may be updated based on patient status, additional functional criteria and insurance authorization. ? ?Follow Up Recommendations ? Acute inpatient rehab (3hours/day) (HHPT if pt decides to go home) ?  ?  ?Assistance Recommended at Discharge Intermittent Supervision/Assistance  ?Patient can return home with the following A little help with bathing/dressing/bathroom;Assist for transportation;Help with stairs or ramp for entrance;A lot of help with walking and/or transfers;Assistance with cooking/housework ?  ?Equipment Recommendations ? None recommended by PT (recommend he continue to use home RW)  ?  ?Recommendations for Other Services   ? ? ?  ?Precautions / Restrictions Precautions ?Precautions: Fall ?Precaution Comments: BP ?Restrictions ?Weight Bearing Restrictions: No  ?  ? ?Mobility ? Bed Mobility ?  ?  ?  ?  ?  ?  ?  ?General bed mobility comments: sitting up in recliner ?  ? ?Transfers ?Overall transfer level: Needs assistance ?Equipment used:  None ?Transfers: Sit to/from Stand ?Sit to Stand: Min guard ?  ?  ?  ?  ?  ?General transfer comment: min guard with no AD ?  ? ?Ambulation/Gait ?Ambulation/Gait assistance: Min assist, Min guard ?Gait Distance (Feet): 400 Feet ?Assistive device: None ?Gait Pattern/deviations: Decreased stride length, Decreased stance time - left, Step-through pattern, Trunk flexed, Knee flexed in stance - left ?Gait velocity: grossly contact guard assist, 1x min A for slight LoB ?Gait velocity interpretation: <1.31 ft/sec, indicative of household ambulator ?  ?General Gait Details: Pt with forward head/rounded shoulders posture and slightly decreased L stance time with mildly antalgic appearing gait but denies pain. minA with a few LOB without UE support. Educated pt to use RW at d/c for safety at this time and of his risk for falls. Pt verbalized understanding. ? ? ?Stairs ?Stairs: Yes ?Stairs assistance: Min assist ?Stair Management: One rail Left, Alternating pattern, Forwards, Step to pattern ?Number of Stairs: 3 ?General stair comments: light min A for steadying, able to perform with step over step ascent and step to descent ? ? ? ? ?  ?Balance Overall balance assessment: Needs assistance ?Sitting-balance support: No upper extremity supported ?Sitting balance-Leahy Scale: Good ?  ?  ?Standing balance support: No upper extremity supported, Bilateral upper extremity supported, During functional activity ?Standing balance-Leahy Scale: Fair ?Standing balance comment: 1x LoB requiring assist with ambulation ?  ?  ?  ?  ?  ?  ?  ?  ?  ?  ?  ?  ? ?  ?Cognition Arousal/Alertness: Awake/alert ?Behavior During Therapy: [facility] for tasks assessed/performed ?Overall Cognitive Status: Within Functional Limits for tasks assessed ?  ?  ?  ?  ?  ?  ?  ?  ?  ?  ?  ?  ?  ?  ?  ?  ?  General Comments: motivated towards therapy and getting home, HoH even with hearing aids at times ?  ?  ? ?  ?Exercises   ? ?  ?General Comments General comments (skin  integrity, edema, etc.): VSS on RA ?  ?  ? ?Pertinent Vitals/Pain Pain Assessment ?Pain Assessment: Faces ?Faces Pain Scale: Hurts a little bit ?Pain Location: R hip L knee ?Pain Descriptors / Indicators: Discomfort, Guarding ?Pain Intervention(s): Limited activity within patient's tolerance, Monitored during session, Repositioned  ? ? ? ?PT Goals (current goals can now be found in the care plan section) Acute Rehab PT Goals ?Patient Stated Goal: to go back to work ?PT Goal Formulation: With patient ?Time For Goal Achievement: 03/14/22 ?Progress towards PT goals: Progressing toward goals ? ?  ?Frequency ? ? ? Min 3X/week ? ? ? ?  ?PT Plan Current plan remains appropriate  ? ? ?   ?AM-PAC PT "6 Clicks" Mobility   ?Outcome Measure ? Help needed turning from your back to your side while in a flat bed without using bedrails?: A Little ?Help needed moving from lying on your back to sitting on the side of a flat bed without using bedrails?: A Little ?Help needed moving to and from a bed to a chair (including a wheelchair)?: None ?Help needed standing up from a chair using your arms (e.g., wheelchair or bedside chair)?: None ?Help needed to walk in hospital room?: A Little ?Help needed climbing 3-5 steps with a railing? : A Little ?6 Click Score: 20 ? ?  ?End of Session Equipment Utilized During Treatment: Gait belt ?Activity Tolerance: Patient tolerated treatment well ?Patient left: in chair;with call bell/phone within reach ?Nurse Communication: Mobility status ?PT Visit Diagnosis: Other abnormalities of gait and mobility (R26.89);Unsteadiness on feet (R26.81);Difficulty in walking, not elsewhere classified (R26.2) ?  ? ? ?Time: 6967-8938 ?PT Time Calculation (min) (ACUTE ONLY): 15 min ? ?Charges:  $Gait Training: 8-22 mins          ?          ? ?Alyxandra Tenbrink B. Migdalia Dk PT, DPT ?Acute Rehabilitation Services ?Pager [phone] ?Office [phone] ? ? ? ?[place] ?03/07/2022, 3:14 PM ? ?

## 2022-03-07 NOTE — Progress Notes (Signed)
PT Cancellation Note ? ?Patient Details ?Name: Jesus [name] ?MRN: [id number] ?DOB: 24-Aug-1941 ? ? ?Cancelled Treatment:    Reason Eval/Treat Not Completed: (P) Fatigue/lethargy limiting ability to participate Pt just worked with OT. PT will follow back for treatment this afternoon as able. ? ?[name] B. Migdalia Dk PT, DPT ?Acute Rehabilitation Services ?Pager [phone] ?Office [phone] ? ? ? ?[place] ?03/07/2022, 9:25 AM ? ? ?

## 2022-03-07 NOTE — Progress Notes (Signed)
Occupational Therapy Treatment ?Patient Details ?Name: Jesus [name] ?MRN: [id number] ?DOB: 07-01-1941 ?Today's Date: 03/07/2022 ? ? ?History of present illness The pt is an 81 yo male presenting 3/26 with SOB. Pt recently here for bilateral LE edema on 3/23 and DVT was ruled out so he was d/c home on 3/24. This admission pt found to have pulmonary edema and bilateral pleural effusions. PMH includes: CAD s/p CABG, NSTEMI 02/20/22 s/p cardiac cath with stent placement, ischemic cardiomyopathy, afib on coumadin, aortic stenosis s/p TAVR 2016, HLD, and HTN. ?  ?OT comments ? Patient received sitting on EOB and recently completed breakfast. Patient was able to ambulate to sink and performed grooming, UB bathing, and bathed peri area while standing without seated break.  Patient bathed feet and performed dressing seated in recliner.  Patient continues to make good progress with OT treatment. Acute OT to continue to follow.   ? ?Recommendations for follow up therapy are one component of a multi-disciplinary discharge planning process, led by the attending physician.  Recommendations may be updated based on patient status, additional functional criteria and insurance authorization. ?   ?Follow Up Recommendations ? Acute inpatient rehab (3hours/day)  ?  ?Assistance Recommended at Discharge Intermittent Supervision/Assistance  ?Patient can return home with the following ? A little help with walking and/or transfers;A little help with bathing/dressing/bathroom;Assist for transportation;Help with stairs or ramp for entrance;Assistance with cooking/housework ?  ?Equipment Recommendations ? None recommended by OT  ?  ?Recommendations for Other Services   ? ?  ?Precautions / Restrictions Precautions ?Precautions: Fall ?Precaution Comments: BP ?Restrictions ?Weight Bearing Restrictions: No  ? ? ?  ? ?Mobility Bed Mobility ?Overal bed mobility: Needs Assistance ?  ?  ?  ?  ?  ?  ?General bed mobility comments: seated on EOB upon  entry ?  ? ?Transfers ?Overall transfer level: Needs assistance ?Equipment used: Rolling walker (2 wheels) ?Transfers: Sit to/from Stand ?Sit to Stand: Min guard ?  ?  ?  ?  ?  ?General transfer comment: ambulated to sink for self care and transferred to recliner ?  ?  ?Balance Overall balance assessment: Needs assistance ?Sitting-balance support: No upper extremity supported ?Sitting balance-Leahy Scale: Good ?  ?  ?Standing balance support: No upper extremity supported, Bilateral upper extremity supported, During functional activity ?Standing balance-Leahy Scale: Fair ?Standing balance comment: able to stand at sink for self care tasks with min guard for safety ?  ?  ?  ?  ?  ?  ?  ?  ?  ?  ?  ?   ? ?ADL either performed or assessed with clinical judgement  ? ?ADL Overall ADL's : Needs assistance/impaired ?  ?  ?Grooming: Wash/dry hands;Wash/dry face;Oral care;Brushing hair;Min guard;Standing ?Grooming Details (indicate cue type and reason): at sink ?Upper Body Bathing: Min guard;Standing ?Upper Body Bathing Details (indicate cue type and reason): at sink ?Lower Body Bathing: Min guard;Sit to/from stand ?Lower Body Bathing Details (indicate cue type and reason): bathed feet seated in recliner ?Upper Body Dressing : Set up;Sitting ?Upper Body Dressing Details (indicate cue type and reason): changed gown ?Lower Body Dressing: Supervision/safety;Sitting/lateral leans ?Lower Body Dressing Details (indicate cue type and reason): changed socks ?  ?  ?  ?  ?  ?  ?  ?General ADL Comments: stood at sink for grooming, UB bathing and peri neal bathing without seated break ?  ? ?Extremity/Trunk Assessment   ?  ?  ?  ?  ?  ? ?Vision   ?  ?  ?  Perception   ?  ?Praxis   ?  ? ?Cognition Arousal/Alertness: Awake/alert ?Behavior During Therapy: [facility] for tasks assessed/performed ?Overall Cognitive Status: Within Functional Limits for tasks assessed ?  ?  ?  ?  ?  ?  ?  ?  ?  ?  ?  ?  ?  ?  ?  ?  ?General Comments: alert and oriented,  states wants to get back to work ?  ?  ?   ?Exercises   ? ?  ?Shoulder Instructions   ? ? ?  ?General Comments    ? ? ?Pertinent Vitals/ Pain       Pain Assessment ?Pain Assessment: Faces ?Faces Pain Scale: Hurts a little bit ?Pain Location: L knee ?Pain Descriptors / Indicators: Discomfort, Guarding ?Pain Intervention(s): Monitored during session ? ?Home Living   ?  ?  ?  ?  ?  ?  ?  ?  ?  ?  ?  ?  ?  ?  ?  ?  ?  ?  ? ?  ?Prior Functioning/Environment    ?  ?  ?  ?   ? ?Frequency ? Min 2X/week  ? ? ? ? ?  ?Progress Toward Goals ? ?OT Goals(current goals can now be found in the care plan section) ? Progress towards OT goals: Progressing toward goals ? ?Acute Rehab OT Goals ?Patient Stated Goal: go home ?OT Goal Formulation: With patient ?Time For Goal Achievement: 03/16/22 ?Potential to Achieve Goals: Good ?ADL Goals ?Pt Will Perform Grooming: with modified independence;standing ?Pt Will Perform Upper Body Bathing: with supervision ?Pt Will Perform Lower Body Bathing: with supervision;sit to/from stand ?Pt Will Perform Upper Body Dressing: with supervision;sitting ?Pt Will Perform Lower Body Dressing: with supervision;sit to/from stand ?Pt Will Transfer to Toilet: with supervision;ambulating;regular height toilet ?Pt Will Perform Toileting - Clothing Manipulation and hygiene: with supervision;sit to/from stand ?Pt Will Perform Tub/Shower Transfer: Shower transfer;with supervision;ambulating;shower seat ?Pt/caregiver will Perform Home Exercise Program: Increased ROM;Both right and left upper extremity;Independently;With theraband;With written HEP provided  ?Plan Discharge plan remains appropriate   ? ?Co-evaluation ? ? ?   ?  ?  ?  ?  ? ?  ?AM-PAC OT "6 Clicks" Daily Activity     ?Outcome Measure ? ? Help from another person eating meals?: None ?Help from another person taking care of personal grooming?: A Little ?Help from another person toileting, which includes using toliet, bedpan, or urinal?: A Lot ?Help from  another person bathing (including washing, rinsing, drying)?: A Little ?Help from another person to put on and taking off regular upper body clothing?: A Little ?Help from another person to put on and taking off regular lower body clothing?: A Little ?6 Click Score: 18 ? ?  ?End of Session Equipment Utilized During Treatment: Rolling walker (2 wheels) ? ?OT Visit Diagnosis: Unsteadiness on feet (R26.81) ?  ?Activity Tolerance Patient tolerated treatment well ?  ?Patient Left in chair;with call bell/phone within reach;with chair alarm set ?  ?Nurse Communication Mobility status ?  ? ?   ? ?Time: 8546-2703 ?OT Time Calculation (min): 31 min ? ?Charges: OT General Charges ?$OT Visit: 1 Visit ?OT Treatments ?$Self Care/Home Management : 23-37 mins ? ?Lodema Hong, OTA ?Acute Rehabilitation Services  ?Pager [phone] ?Office [phone] ? ? ?[place] ?03/07/2022, 9:37 AM ?

## 2022-03-07 NOTE — Progress Notes (Signed)
Inpatient Rehab Admissions Coordinator:  ? ?Insurance has overturned denial and pt is approved for CIR. I do not have a bed today but will f/u for possible admit tomorrow pending bed availability.  ? ?Shann Medal, PT, DPT ?Admissions Coordinator ?[phone] ?03/07/22  ?3:48 PM ? ?

## 2022-03-08 ENCOUNTER — Inpatient Hospital Stay (HOSPITAL_COMMUNITY)
Admission: RE | Admit: 2022-03-08 | Discharge: 2022-03-15 | DRG: 945 | Disposition: A | Discharge status: Home Health | Payer: Medicare Other | Source: Intra-hospital | Attending: Physical Medicine and Rehabilitation | Admitting: Physical Medicine and Rehabilitation

## 2022-03-08 ENCOUNTER — Other Ambulatory Visit (HOSPITAL_COMMUNITY): Payer: Self-pay

## 2022-03-08 ENCOUNTER — Other Ambulatory Visit: Payer: Self-pay

## 2022-03-08 DIAGNOSIS — Z7902 Long term (current) use of antithrombotics/antiplatelets: Secondary | ICD-10-CM | POA: Diagnosis not present

## 2022-03-08 DIAGNOSIS — T508X5A Adverse effect of diagnostic agents, initial encounter: Secondary | ICD-10-CM | POA: Diagnosis not present

## 2022-03-08 DIAGNOSIS — I5023 Acute on chronic systolic (congestive) heart failure: Secondary | ICD-10-CM | POA: Diagnosis not present

## 2022-03-08 DIAGNOSIS — D509 Iron deficiency anemia, unspecified: Secondary | ICD-10-CM | POA: Diagnosis present

## 2022-03-08 DIAGNOSIS — I429 Cardiomyopathy, unspecified: Secondary | ICD-10-CM | POA: Diagnosis not present

## 2022-03-08 DIAGNOSIS — Z7982 Long term (current) use of aspirin: Secondary | ICD-10-CM | POA: Diagnosis not present

## 2022-03-08 DIAGNOSIS — N179 Acute kidney failure, unspecified: Secondary | ICD-10-CM | POA: Diagnosis present

## 2022-03-08 DIAGNOSIS — I480 Paroxysmal atrial fibrillation: Secondary | ICD-10-CM | POA: Diagnosis present

## 2022-03-08 DIAGNOSIS — K219 Gastro-esophageal reflux disease without esophagitis: Secondary | ICD-10-CM | POA: Diagnosis not present

## 2022-03-08 DIAGNOSIS — Z953 Presence of xenogenic heart valve: Secondary | ICD-10-CM | POA: Diagnosis not present

## 2022-03-08 DIAGNOSIS — N189 Chronic kidney disease, unspecified: Secondary | ICD-10-CM | POA: Diagnosis present

## 2022-03-08 DIAGNOSIS — R5381 Other malaise: Secondary | ICD-10-CM | POA: Diagnosis not present

## 2022-03-08 DIAGNOSIS — I5022 Chronic systolic (congestive) heart failure: Secondary | ICD-10-CM

## 2022-03-08 DIAGNOSIS — I951 Orthostatic hypotension: Secondary | ICD-10-CM | POA: Diagnosis present

## 2022-03-08 DIAGNOSIS — Z8249 Family history of ischemic heart disease and other diseases of the circulatory system: Secondary | ICD-10-CM

## 2022-03-08 DIAGNOSIS — I252 Old myocardial infarction: Secondary | ICD-10-CM

## 2022-03-08 DIAGNOSIS — Z95820 Peripheral vascular angioplasty status with implants and grafts: Secondary | ICD-10-CM | POA: Diagnosis not present

## 2022-03-08 DIAGNOSIS — Z8042 Family history of malignant neoplasm of prostate: Secondary | ICD-10-CM

## 2022-03-08 DIAGNOSIS — Z823 Family history of stroke: Secondary | ICD-10-CM | POA: Diagnosis not present

## 2022-03-08 DIAGNOSIS — I130 Hypertensive heart and chronic kidney disease with heart failure and stage 1 through stage 4 chronic kidney disease, or unspecified chronic kidney disease: Secondary | ICD-10-CM | POA: Diagnosis present

## 2022-03-08 DIAGNOSIS — Z79899 Other long term (current) drug therapy: Secondary | ICD-10-CM

## 2022-03-08 DIAGNOSIS — Z951 Presence of aortocoronary bypass graft: Secondary | ICD-10-CM | POA: Diagnosis not present

## 2022-03-08 DIAGNOSIS — R001 Bradycardia, unspecified: Secondary | ICD-10-CM

## 2022-03-08 DIAGNOSIS — Z885 Allergy status to narcotic agent status: Secondary | ICD-10-CM | POA: Diagnosis not present

## 2022-03-08 DIAGNOSIS — Z888 Allergy status to other drugs, medicaments and biological substances status: Secondary | ICD-10-CM

## 2022-03-08 DIAGNOSIS — Z955 Presence of coronary angioplasty implant and graft: Secondary | ICD-10-CM

## 2022-03-08 DIAGNOSIS — I10 Essential (primary) hypertension: Secondary | ICD-10-CM | POA: Diagnosis present

## 2022-03-08 DIAGNOSIS — I2510 Atherosclerotic heart disease of native coronary artery without angina pectoris: Secondary | ICD-10-CM | POA: Diagnosis not present

## 2022-03-08 DIAGNOSIS — K5900 Constipation, unspecified: Secondary | ICD-10-CM | POA: Diagnosis not present

## 2022-03-08 DIAGNOSIS — Z7901 Long term (current) use of anticoagulants: Secondary | ICD-10-CM

## 2022-03-08 LAB — PROTIME-INR
INR: 2.3 — ABNORMAL HIGH (ref 0.8–1.2)
Prothrombin Time: 25 seconds — ABNORMAL HIGH (ref 11.4–15.2)

## 2022-03-08 LAB — BASIC METABOLIC PANEL
Anion gap: 8 (ref 5–15)
BUN: 31 mg/dL — ABNORMAL HIGH (ref 8–23)
CO2: 23 mmol/L (ref 22–32)
Calcium: 8.7 mg/dL — ABNORMAL LOW (ref 8.9–10.3)
Chloride: 108 mmol/L (ref 98–111)
Creatinine, Ser: 1.36 mg/dL — ABNORMAL HIGH (ref 0.61–1.24)
GFR, Estimated: 52 mL/min — ABNORMAL LOW (ref >60)
Glucose, Bld: 104 mg/dL — ABNORMAL HIGH (ref 70–99)
Potassium: 4.4 mmol/L (ref 3.5–5.1)
Sodium: 139 mmol/L (ref 135–145)

## 2022-03-08 MED ORDER — EMPAGLIFLOZIN 10 MG PO TABS
10.0000 mg | ORAL_TABLET | Freq: Every day | ORAL | Status: DC
Start: 2022-03-09 — End: 2022-03-15
  Administered 2022-03-09 – 2022-03-15 (×7): 10 mg via ORAL
  Filled 2022-03-08 (×8): qty 1

## 2022-03-08 MED ORDER — SORBITOL 70 % SOLN
30.0000 mL | Freq: Every day | Status: DC | PRN
Start: 2022-03-08 — End: 2022-03-15

## 2022-03-08 MED ORDER — AMIODARONE HCL 200 MG PO TABS
400.0000 mg | ORAL_TABLET | Freq: Every day | ORAL | Status: DC
  Administered 2022-03-09 – 2022-03-14 (×6): 400 mg via ORAL
  Filled 2022-03-08 (×6): qty 2

## 2022-03-08 MED ORDER — PROCHLORPERAZINE MALEATE 5 MG PO TABS: 5.0000 mg | ORAL_TABLET | Freq: Four times a day (QID) | ORAL | Status: DC | PRN

## 2022-03-08 MED ORDER — PROCHLORPERAZINE 25 MG RE SUPP: 12.5000 mg | Freq: Four times a day (QID) | RECTAL | Status: DC | PRN

## 2022-03-08 MED ORDER — DIPHENHYDRAMINE HCL 12.5 MG/5ML PO ELIX: 12.5000 mg | ORAL_SOLUTION | Freq: Four times a day (QID) | ORAL | Status: DC | PRN

## 2022-03-08 MED ORDER — METHOCARBAMOL 500 MG PO TABS
500.0000 mg | ORAL_TABLET | Freq: Four times a day (QID) | ORAL | Status: DC | PRN
Start: 2022-03-08 — End: 2022-03-15

## 2022-03-08 MED ORDER — PANTOPRAZOLE SODIUM 40 MG PO TBEC
40.0000 mg | DELAYED_RELEASE_TABLET | Freq: Every day | ORAL | Status: DC
  Administered 2022-03-09 – 2022-03-11 (×3): 40 mg via ORAL
  Filled 2022-03-08 (×4): qty 1

## 2022-03-08 MED ORDER — SPIRONOLACTONE 25 MG PO TABS
25.0000 mg | ORAL_TABLET | Freq: Every day | ORAL | Status: DC
  Administered 2022-03-09 – 2022-03-14 (×6): 25 mg via ORAL
  Filled 2022-03-08 (×6): qty 1

## 2022-03-08 MED ORDER — TRAZODONE HCL 50 MG PO TABS: 25.0000 mg | ORAL_TABLET | Freq: Every evening | ORAL | Status: DC | PRN

## 2022-03-08 MED ORDER — FUROSEMIDE 20 MG PO TABS
20.0000 mg | ORAL_TABLET | Freq: Every day | ORAL | Status: DC
Start: 2022-03-09 — End: 2022-03-14
  Administered 2022-03-09 – 2022-03-14 (×6): 20 mg via ORAL
  Filled 2022-03-08 (×6): qty 1

## 2022-03-08 MED ORDER — GUAIFENESIN-DM 100-10 MG/5ML PO SYRP: 5.0000 mL | ORAL_SOLUTION | Freq: Four times a day (QID) | ORAL | Status: DC | PRN

## 2022-03-08 MED ORDER — PROCHLORPERAZINE EDISYLATE 10 MG/2ML IJ SOLN: 5.0000 mg | Freq: Four times a day (QID) | INTRAMUSCULAR | Status: DC | PRN

## 2022-03-08 MED ORDER — MELATONIN 5 MG PO TABS: 5.0000 mg | ORAL_TABLET | Freq: Every evening | ORAL | Status: DC | PRN

## 2022-03-08 MED ORDER — WARFARIN SODIUM 4 MG PO TABS
4.0000 mg | ORAL_TABLET | Freq: Every day | ORAL | Status: DC
Start: 2022-03-08 — End: 2022-03-10
  Administered 2022-03-08 – 2022-03-09 (×2): 4 mg via ORAL
  Filled 2022-03-08 (×2): qty 1

## 2022-03-08 MED ORDER — ATORVASTATIN CALCIUM 80 MG PO TABS
80.0000 mg | ORAL_TABLET | Freq: Every day | ORAL | Status: DC
  Administered 2022-03-09 – 2022-03-15 (×7): 80 mg via ORAL
  Filled 2022-03-08 (×7): qty 1

## 2022-03-08 MED ORDER — FUROSEMIDE 20 MG PO TABS
20.0000 mg | ORAL_TABLET | Freq: Every day | ORAL | Status: DC
Start: 2022-03-09 — End: 2022-03-14

## 2022-03-08 MED ORDER — LORATADINE 10 MG PO TABS: 10.0000 mg | ORAL_TABLET | Freq: Every day | ORAL | Status: DC | PRN

## 2022-03-08 MED ORDER — FUROSEMIDE 20 MG PO TABS
20.0000 mg | ORAL_TABLET | Freq: Every day | ORAL | Status: DC
Start: 2022-03-08 — End: 2022-03-08
  Administered 2022-03-08: 20 mg via ORAL
  Filled 2022-03-08: qty 1

## 2022-03-08 MED ORDER — ACETAMINOPHEN 325 MG PO TABS
325.0000 mg | ORAL_TABLET | ORAL | Status: DC | PRN
  Administered 2022-03-11: 650 mg via ORAL
  Filled 2022-03-08: qty 2

## 2022-03-08 MED ORDER — CLOPIDOGREL BISULFATE 75 MG PO TABS
75.0000 mg | ORAL_TABLET | Freq: Every day | ORAL | Status: DC
  Administered 2022-03-09 – 2022-03-15 (×7): 75 mg via ORAL
  Filled 2022-03-08 (×7): qty 1

## 2022-03-08 MED ORDER — ALUM & MAG HYDROXIDE-SIMETH 200-200-20 MG/5ML PO SUSP
30.0000 mL | ORAL | Status: DC | PRN
  Administered 2022-03-11: 30 mL via ORAL
  Filled 2022-03-08: qty 30

## 2022-03-08 MED ORDER — POLYETHYLENE GLYCOL 3350 17 G PO PACK
17.0000 g | PACK | Freq: Every day | ORAL | Status: DC | PRN
Start: 2022-03-08 — End: 2022-03-15

## 2022-03-08 MED ORDER — METOPROLOL SUCCINATE ER 25 MG PO TB24
25.0000 mg | ORAL_TABLET | Freq: Every day | ORAL | Status: DC
Start: 2022-03-09 — End: 2022-03-12
  Administered 2022-03-09 – 2022-03-10 (×2): 25 mg via ORAL
  Filled 2022-03-08 (×4): qty 1

## 2022-03-08 MED ORDER — WARFARIN - PHARMACIST DOSING INPATIENT: Freq: Every day | Status: DC

## 2022-03-08 MED ORDER — MELATONIN 5 MG PO TABS
10.0000 mg | ORAL_TABLET | Freq: Every evening | ORAL | Status: DC | PRN
Start: 2022-03-08 — End: 2022-03-08

## 2022-03-08 MED ORDER — ALBUTEROL SULFATE (2.5 MG/3ML) 0.083% IN NEBU: 3.0000 mL | INHALATION_SOLUTION | RESPIRATORY_TRACT | Status: DC | PRN

## 2022-03-08 MED ORDER — AMIODARONE HCL 400 MG PO TABS: 400.0000 mg | ORAL_TABLET | Freq: Every day | ORAL | Status: DC

## 2022-03-08 MED ORDER — FLEET ENEMA 7-19 GM/118ML RE ENEM
1.0000 | ENEMA | Freq: Once | RECTAL | Status: DC | PRN
Start: 2022-03-08 — End: 2022-03-15

## 2022-03-08 MED ORDER — HYDROCODONE-ACETAMINOPHEN 5-325 MG PO TABS: 1.0000 | ORAL_TABLET | Freq: Four times a day (QID) | ORAL | Status: DC | PRN

## 2022-03-08 MED ORDER — AMIODARONE HCL 200 MG PO TABS
400.0000 mg | ORAL_TABLET | Freq: Every day | ORAL | 3 refills | Status: DC
Start: 2022-03-08 — End: 2022-03-15
  Filled 2022-03-08: qty 80, 40d supply, fill #0

## 2022-03-08 MED ORDER — ADULT MULTIVITAMIN W/MINERALS CH
1.0000 | ORAL_TABLET | Freq: Every day | ORAL | Status: DC
  Administered 2022-03-09 – 2022-03-15 (×7): 1 via ORAL
  Filled 2022-03-08 (×7): qty 1

## 2022-03-08 NOTE — Progress Notes (Signed)
ANTICOAGULATION CONSULT NOTE - Follow Up Consult ? ?Pharmacy Consult for Warfarin ?Indication: TAVR / PAF ? ?Allergies  ?Allergen Reactions  ? Codeine Other (See Comments)  ?  Pt does not remember  ? Iodine Swelling  ? ? ?Patient Measurements: ?Height: '5\' 1"'$  (154.9 cm) ?Weight: 55.7 kg (122 lb 12.7 oz) ?IBW/kg (Calculated) : 52.3 ? ?Vital Signs: ?Temp: 98 ?F (36.7 ?C) (04/04 0744) ?Temp Source: Oral (04/04 0744) ?BP: 123/42 (04/04 0744) ?Pulse Rate: 52 (04/04 0744) ? ?Labs: ?Recent Labs  ?  03/06/22 ?0045 03/07/22 ?4970 03/08/22 ?0041  ?LABPROT 24.6* 23.0* 25.0*  ?INR 2.2* 2.0* 2.3*  ?CREATININE 1.33* 1.69* 1.36*  ? ? ? ?Estimated Creatinine Clearance: 30.4 mL/min (A) (by C-G formula based on SCr of 1.36 mg/dL (H)). ? ? ?Assessment: ?On warfarin PTA for Afib, 4 mg daily PTA ?INR therapeutic at 2.3 today. Last CBC stable (4/1) ?Warfarin held 3/28 and 3/29 to allow INR to drift down. ?No bleed issues reported ?Noted on amiodarone (PTA dose) ? ?Goal of Therapy:  ?INR 2-3 ?Monitor platelets by anticoagulation protocol: Yes ?  ?Plan:  ?Warfarin '4mg'$  daily ?Monitor daily INR, CBC, s/sx bleeding ? ? ?Arturo Morton, PharmD, BCPS ?Please check AMION for all [facility] contact numbers ?Clinical Pharmacist ?03/08/2022 8:45 AM ?

## 2022-03-08 NOTE — Progress Notes (Signed)
Pt arrived to unit via w/c, pt is alert and oriented, HOH. Educated on rehab. ?

## 2022-03-08 NOTE — Progress Notes (Signed)
Inpatient Rehab Admissions Coordinator:   ? ?I have insurance approval and a bed available for pt to admit to CIR today. Dr. Broadus John in agreement.  Will let pt/family and TOC team know.  ? ?Shann Medal, PT, DPT ?Admissions Coordinator ?[phone] ?03/08/22  ?10:21 AM  ? ?

## 2022-03-08 NOTE — Discharge Summary (Signed)
Physician Discharge Summary  ?MCIHAEL [name] MPN:[id number] DOB: June 22, 1941 DOA: 02/27/2022 ? ?PCP: Jesus Post, MD ? ?Admit date: 02/27/2022 ?Discharge date: 03/08/2022 ? ?Time spent: 45 minutes ? ?Recommendations for Outpatient Follow-up:  ?Heart failure TOC clinic 4/11, please check BMP at follow-up ?Cardiology Dr. Burt [name] in 1 month ?CIR for short-term rehab ? ? ?Discharge Diagnoses:  ?Principal Problem: ?  Acute on chronic systolic congestive heart failure ([place]) ?Acute hypoxic respiratory failure ?  Coronary artery disease involving coronary bypass graft of native heart without angina pectoris ?  Acute kidney injury superimposed on chronic kidney disease ([place]) ?  Paroxysmal atrial fibrillation (HCC) ?  Essential hypertension ?  Mixed hyperlipidemia ?  Iron deficiency anemia ?  GERD without esophagitis ? ? ?Discharge Condition: Stable ? ?Diet recommendation: Low-sodium, heart healthy ? ?Filed Weights  ? 03/05/22 0500 03/06/22 0400 03/08/22 0420  ?Weight: 58.3 kg 58.5 kg 55.7 kg  ? ? ?History of present illness:  ?[name] with history of CAD, CABG, AS, TAVR, chronic systolic CHF, hypertension, paroxysmal A-fib on Coumadin was recently hospitalized with NSTEMI from 3/14-19, cath noted severe native vessel CAD s/p 6V CABG with 6/6 patent grafts, severe stenosis in the proximal body of the SVG to the PDA-Successful PTCA/DES x 1 proximal body of SVG to PDA. ?-Presented to the ED 3/26 with increase dyspnea on exertion and lower extremity edema, chest x-ray noted pleural effusion, pulmonary edema, BNP 1033 ? ?Hospital Course:  ? ?Acute on chronic systolic congestive heart failure ([place]) ?Acute hypoxic respiratory failure ?-Presented to the ED with increased dyspnea, edema, chest x-ray noted pulmonary edema and pleural effusion  ?-recent echo 3/15 echocardiogram with reduced LV systolic function with EF 35 to 40%, with inferior and inferior lateral hypokinesis. Left atrium with severe dilatation, SP TAVR. ?-Diuresed with  IV Lasix, he is 4.6 L negative, transition to low-dose Lasix 20 Mg daily ?-Improving continue current regimen of Toprol, Aldactone, empagliflozin, ?-Kidney function has stabilized, creatinine 1.3 at discharge, this discharge was delayed after initially getting denied for CIR, this was appealed and finally approved yesterday evening ?-Discharge to CIR for rehab ?-Follow-up in cardiology [facility] clinic, please check BMP at follow-up ?  ?Coronary artery disease involving coronary bypass graft of native heart without angina pectoris ?-Minimally elevated high-sensitivity troponin in the setting of CHF, no ACS ?-Recent PTCA/stenting of SVG to PDA ?-Continue Plavix, metoprolol, statin, warfarin ?  ?Acute kidney injury superimposed on chronic kidney disease ([place]) ?CKD stage 2. ?-Creatinine gradually worsened to 1.5, now improving ?-ARB discontinued ?  ?Paroxysmal atrial fibrillation (HCC) ?History of non sustained ventricular tachycardia.   ?-Anticoagulation with warfarin, INR therapeutic ?  ?Essential hypertension ?-Developed orthostatic hypotension 3/28, amlodipine and losartan discontinued ?-Continue  spironolactone and metoprolol.   ?-Symptoms resolved now ?  ?Iron deficiency anemia ?Iron panel with serum iron at 15, tibc 242, transferrin saturation 6, consistent with iron deficiency. ?-Received IV iron supplementation.  ?  ?GERD without esophagitis ?Continue Protonix ?  ? ?Code Status: DNR ? ?Discharge Exam: ?Vitals:  ? 03/08/22 0908 03/08/22 1200  ?BP: (!) 133/53 (!) 126/51  ?Pulse: 65   ?Resp:  19  ?Temp:  98 ?F (36.7 ?C)  ?SpO2:  96%  ? ?General exam: Pleasant elderly male sitting up in bed, AAOx3, no distress ?HEENT: No JVD ?CVS: S1-S2, regular rhythm ?Lungs: Clear bilaterally ?Abdomen: Soft, nontender, bowel sounds present ?Extremities: No edema  ?Skin: No rashes ?Psychiatry: Judgement and insight appear normal. Mood & affect appropriate.  ? ? ?Discharge Instructions ? ? ?  Discharge Instructions   ? ? Diet - low sodium  heart healthy   Complete by: As directed ?  ? Increase activity slowly   Complete by: As directed ?  ? ?  ? ?Allergies as of 03/08/2022   ? ?   Reactions  ? Amlodipine Other (See Comments)  ? Swelling?  ? Codeine Other (See Comments)  ? Pt does not remember  ? Iodine Swelling  ? ?  ? ?  ?Medication List  ?  ? ?STOP taking these medications   ? ?amLODipine 10 MG tablet ?Commonly known as: NORVASC ?  ?losartan 50 MG tablet ?Commonly known as: COZAAR ?  ?metoprolol tartrate 25 MG tablet ?Commonly known as: LOPRESSOR ?  ? ?  ? ?TAKE these medications   ? ?albuterol 108 (90 Base) MCG/ACT inhaler ?Commonly known as: VENTOLIN HFA ?INHALE 2 PUFFS BY MOUTH EVERY 4 HOURS ASNEEDED FOR WHEEZING OR SHORTNESS OF BREATH. ?What changed: See the new instructions. ?  ?amiodarone 200 MG tablet ?Commonly known as: PACERONE ?Take 2 tablets (400 mg total) by mouth 2 (two) times daily. Take 2 tabs ( 400 mg total) twice a day for 7 days then take 400 mg daily. ?What changed:  ?when to take this ?additional instructions ?  ?amiodarone 400 MG tablet ?Commonly known as: PACERONE ?Take 1 tablet (400 mg total) by mouth daily. ?Start taking on: March 09, 2022 ?What changed: You were already taking a medication with the same name, and this prescription was added. Make sure you understand how and when to take each. ?  ?amoxicillin 400 MG/5ML suspension ?Commonly known as: AMOXIL ?TAKE 5 TEASPOONFULS BY MOUTH 1 HOUR PRIOR TO DENTAL PROCEDURE ?What changed:  ?how much to take ?how to take this ?when to take this ?  ?aspirin 81 MG tablet ?Take 81 mg by mouth at bedtime. ?  ?atorvastatin 80 MG tablet ?Commonly known as: LIPITOR ?Take 1 tablet (80 mg total) by mouth daily. ?  ?clopidogrel 75 MG tablet ?Commonly known as: PLAVIX ?Take 1 tablet (75 mg total) by mouth daily with breakfast. ?  ?diclofenac Sodium 1 % Gel ?Commonly known as: VOLTAREN ?Apply 4 g topically 4 (four) times daily. ?  ?empagliflozin 10 MG Tabs tablet ?Commonly known as:  JARDIANCE ?Take 1 tablet (10 mg total) by mouth daily. ?  ?furosemide 20 MG tablet ?Commonly known as: LASIX ?Take 1 tablet (20 mg total) by mouth daily. ?Start taking on: March 09, 2022 ?  ?loratadine 10 MG tablet ?Commonly known as: CLARITIN ?Take 10 mg by mouth daily as needed for allergies. ?  ?metoprolol succinate 25 MG 24 hr tablet ?Commonly known as: TOPROL-XL ?Take 1 tablet (25 mg total) by mouth daily. ?  ?multivitamin,tx-minerals tablet ?Take 1 tablet by mouth daily. ?  ?nitroGLYCERIN 0.4 MG SL tablet ?Commonly known as: NITROSTAT ?Place 1 tablet (0.4 mg total) under the tongue every 5 (five) minutes x 3 doses as needed for chest pain. ?What changed: when to take this ?  ?spironolactone 25 MG tablet ?Commonly known as: ALDACTONE ?Take 1 tablet (25 mg total) by mouth daily. ?  ?vitamin C 1000 MG tablet ?Take 1,000 mg by mouth daily. ?  ?warfarin 4 MG tablet ?Commonly known as: COUMADIN ?Take as directed. If you are unsure how to take this medication, talk to your nurse or doctor. ?Original instructions: TAKE 1 TABLET BY MOUTH DAILY OR TAKE AS DIRECTED BY ANTICOAGULATION CLINIC ?What changed: See the new instructions. ?  ? ?  ? ?Allergies  ?  Allergen Reactions  ? Amlodipine Other (See Comments)  ?  Swelling?  ? Codeine Other (See Comments)  ?  Pt does not remember  ? Iodine Swelling  ? ? Follow-up Information   ? ? Jesus Post, MD. Schedule an appointment as soon as possible for a visit in 1 week(s).   ?Specialty: Family Medicine ?Contact information: ?[address] ?[name] [address] ?[phone] ? ? ?  ?  ? ? [name], MD .   ?Specialty: Cardiology ?Contact information: ?[address] ?[address] ?[name] [address] ?[phone] ? ? ?  ?  ? ? [facility]. Go on 03/15/2022.   ?Specialty: Cardiology ?Why: 11:00 AM, [facility], [facility], free valet parking is available ?Contact information: ?[address] ?[id number] mc ?[place] ?[phone] ? ?  ?  ? ?  ?  ? ?  ? ? ? ?The results of significant diagnostics from this hospitalization (including imaging, microbiology, ancilla

## 2022-03-08 NOTE — Progress Notes (Signed)
Patient ID: Jesus [name], male   DOB: 10-17-1941, 81 y.o.   MRN: [id number] ?Met with the patient to introduce self, review role, team conference rehab process and plan of care. Reviewed medications and secondary risk management including HTN, HLD (lipitor) and HF. Discussed HH dietary modifications and food choices for increased proteins and calcium. Patient is anxious to go home and return to work. Continue to follow along to discharge to address educational needs to facilitate preparation for discharge. Jesus [name] ? ?

## 2022-03-08 NOTE — Progress Notes (Signed)
ANTICOAGULATION CONSULT NOTE - Follow Up Consult ? ?Pharmacy Consult for Warfarin ?Indication: TAVR / PAF ? ?Allergies  ?Allergen Reactions  ? Amlodipine Other (See Comments)  ?  Swelling?  ? Codeine Other (See Comments)  ?  Pt does not remember  ? Iodine Swelling  ? ? ?Patient Measurements: ?  ? ?Vital Signs: ?Temp: 98 ?F (36.7 ?C) (04/04 1200) ?Temp Source: Oral (04/04 1200) ?BP: 126/45 (04/04 1403) ?Pulse Rate: 65 (04/04 0908) ? ?Labs: ?Recent Labs  ?  03/06/22 ?0045 03/07/22 ?3888 03/08/22 ?0041  ?LABPROT 24.6* 23.0* 25.0*  ?INR 2.2* 2.0* 2.3*  ?CREATININE 1.33* 1.69* 1.36*  ? ? ? ?Estimated Creatinine Clearance: 30.4 mL/min (A) (by C-G formula based on SCr of 1.36 mg/dL (H)). ? ? ?Assessment: ?On warfarin PTA for Afib, 4 mg daily PTA ?INR therapeutic at 2.3 today. Last CBC stable (4/1) ?Warfarin held 3/28 and 3/29 to allow INR to drift down. ?No bleed issues reported ?Noted on amiodarone (PTA dose) ? ?Goal of Therapy:  ?INR 2-3 ?Monitor platelets by anticoagulation protocol: Yes ?  ?Plan:  ?Warfarin '4mg'$  daily ?Monitor daily INR, CBC, s/sx bleeding ? ? ?Arturo Morton, PharmD, BCPS ?Please check AMION for all [place] contact numbers ?Clinical Pharmacist ?03/08/2022 3:14 PM ? ?Copied to new admission on CIR 03/08/2022 15:14 ? ?Donaciano Range BS, PharmD, BCPS ?Clinical Pharmacist ?03/08/2022 3:14 PM ? ?

## 2022-03-08 NOTE — Progress Notes (Signed)
Heart Failure Stewardship Pharmacist Progress Note ? ? ?PCP: [name], MD ?PCP-Cardiologist: [name], MD  ? ? ?HPI:  ?81 yo M with PMH of CAD s/p CABG, CHF, afib, aortic stenosis s/p TAVR, HLD, and HTN. He presented to the ED on 3/26 with shortness of breath and LE edema. CXR with possible mild interstitial edema and small bilateral pleural effusions. His last ECHO from 3/15 with LVEF 35-40% and G2DD. CIR approved pending bed availability. ? ?Current HF Medications: ?Diuretic: furosemide 20 mg daily ?Beta Blocker: metoprolol XL 25 mg daily ?Aldosterone Antagonist: spironolactone 25 mg daily ?SGLT2i: Jardiance 10 mg daily ? ?Prior to admission HF Medications: ?Beta blocker: metoprolol tartrate 25 mg BID ?ACE/ARB/ARNI: losartan 50 mg daily ? ?Pertinent Lab Values: ?Serum creatinine 1.36, BUN 31, Potassium 4.4, Sodium 139, BNP 1033.5, A1c 5.9 ?TSAT 6 ? ?Vital Signs: ?Weight: 122 lbs (admission weight: 132 lbs) ?Blood pressure: 100-130/30-50s  ?Heart rate: 50s  ?I/O: -482m yesterday; net -2.6L ? ?Medication Assistance / Insurance Benefits Check: ?Does the patient have prescription insurance?  Yes ?[facility] ? ?Outpatient Pharmacy:  ?[facility] ?Is the patient willing to use [facility]? Yes ?Is the patient willing to [facility] their [facility]?   Pending ?  ? ?Assessment: ?1. Acute on chronic systolic CHF (EF 391-47%, due to ICM. NYHA class II symptoms. ?- Agree with resuming furosemide 20 mg PO daily ?- Continue metoprolol XL 25 mg daily - dose reduced from tartrate ok with orthostatic hypotension ?- Losartan on hold with orthostatic hypotension ?- Continue spironolactone 25 mg daily ? - Continue Jardiance 10 mg daily ?- Agree with stopping amlodipine with reduced LVEF ?- IV iron x 1 given on 3/28 ? ?Plan: ?1) Medication changes recommended at this time: ?- Continue  current regimen ? ?2) Patient assistance: ?- Entresto copay $47 ?- Jardiance copay $47 ? ?MKerby Nora PharmD, BCPS ?Heart Failure Stewardship Pharmacist ?Phone [phone]? ? ?

## 2022-03-08 NOTE — Progress Notes (Signed)
Inpatient Rehabilitation Admission Medication Review by a Pharmacist ? ?A complete drug regimen review was completed for this patient to identify any potential clinically significant medication issues. ? ?High Risk Drug Classes Is patient taking? Indication by Medication  ?Antipsychotic Yes Compazine- N/V  ?Anticoagulant Yes Warfarin- PAF  ?Antibiotic No   ?Opioid Yes Norco- acute pain  ?Antiplatelet Yes Plavix- CVA prophylaxis  ?Hypoglycemics/insulin Yes Jardiance- HFrEF  ?Vasoactive Medication Yes Lasix, amiodarone, spironolactone, toprol- hypertension, HFrEF, rate control  ?Chemotherapy No   ?Other Yes Lipitor- HLD ?Protonix- GERD  ? ? ? ?Type of Medication Issue Identified Description of Issue Recommendation(s)  ?Drug Interaction(s) (clinically significant) ?    ?Duplicate Therapy ?    ?Allergy ?    ?No Medication Administration End Date ?    ?Incorrect Dose ?    ?Additional Drug Therapy Needed ?    ?Significant med changes from prior encounter (inform family/care partners about these prior to discharge).    ?Other ? PTA meds: ?NitroSL ?Voltaren gel ?Cozaar ?Norvasc Restart PTA meds when and if necessary during CIR admission or at time of discharge, if warranted.   ? ? ?Clinically significant medication issues were identified that warrant physician communication and completion of prescribed/recommended actions by midnight of the next day:  No ? ? ?Time spent performing this drug regimen review (minutes):  30 ? ? ?Brenna Friesenhahn BS, PharmD, BCPS ?Clinical Pharmacist ?03/08/2022 3:13 PM ?

## 2022-03-08 NOTE — Progress Notes (Signed)
PMR Admission Coordinator Pre-Admission Assessment ?  ?Patient: Jesus [name] is an 81 y.o., male ?MRN: [id number] ?DOB: 12-18-1940 ?Height: '5\' 1"'  (154.9 cm) ?Weight: 58.5 kg ?  ?Insurance Information ?HMO: yes    PPO:      PCP:      IPA:      80/20:      OTHER:  ?PRIMARY: [employer]      Policy#: [id number]      Subscriber: pt ?CM Name: Abigail Butts    Phone#: [phone]     Fax#: [phone] ?Pre-Cert#:  [id number] Josem Kaufmann for CIR from Abigail Butts with Bernadene Bell following expedited appeal with updates due to fax listed above on day 7      Employer:  ?Benefits:  Phone #: [phone]     Name:  ?Eff. Date: 12/05/21     Deduct: $0      Out of Pocket Max: $4500 (met $161.40)      Life Max: n/a ?CIR: $325/day for days 1-5      SNF: 20 full days ?Outpatient:      Co-Pay: $20/visit ?Home Health: 100%      Co-Pay:  ?DME: 80%       Co-Ins: 20% ?Providers:  ?SECONDARY:       Policy#:      Phone#:  ?  ?Financial Counselor:       Phone#:  ?  ?The ?Data Collection Information Summary? for patients in Inpatient Rehabilitation Facilities with attached ?Privacy Act [place] Records? was provided and verbally reviewed with: Patient ?  ?Emergency Contact Information ?Contact Information   ?  ?  Name Relation Home Work Mobile  ?  Apple,Rhonda Niece     [phone]  ?  SILER,Mike Relative [phone]      ?  Peterson,Anita Niece     [phone]  ?  ?   ?  ?  ?Current Medical History  ?Patient Admitting Diagnosis: cardiac debility ?  ?History of Present Illness: Pt is a 81 y/o male with pMH of CAD, CABG, AS, TAVR, chrnoic systolic CHF, HTN, paroxysmal a-fib (on coumadin), recently hospitalized on 3/14 with NSTEMI.  Pt presented to [facility] on 3/26 with increased dyspnea and lower extremity edema.  Chest xray noted pleural effusion and pulmonary edema,  BP 1033.  Pt was diuresed with IV lasix and recommended to continue low dose at discharge.  Troponins mildly elevated, thought to be due to CHF.  Pt with recent PTCA/stent of SVG  to PDA.  Mild bump in creatinine with diuresis.  Lasix held.  Therapy ongoing and pt was recommended for CIR.    ?  ?Patient's medical record from [facility] has been reviewed by the rehabilitation admission coordinator and physician. ?  ?Past Medical History  ?    ?Past Medical History:  ?Diagnosis Date  ? 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 Aortic stenosis    ?  mild with a mean aortic valve gradient of 12 mmHg  ? Atrial fibrillation ([place])    ?  holding sinus rhythm on Amiodarone  ? CAD (coronary artery disease)    ?  a. S/P Ant MI 1980;  b. 1997 S/P CABG x 8 (LIMA to diag-LAD, SVG to OM1-OM2-OM3, SVG to [facility] - Dr [name]);  c. 01/2015 Cath: 3VD, 8/8 patent grafts.  ? Cardiomyopathy    ? Dysrhythmia    ? Esophageal reflux    ? GERD (gastroesophageal reflux disease)    ? Headache    ? History of  colonoscopy    ? History of transesophageal echocardiography (TEE) for monitoring    ? Other and unspecified hyperlipidemia    ? Paroxysmal atrial fibrillation ([place]) 02/20/2022  ? S/P angioplasty with stent 02/17/22 DES to proximal VG to PDA 02/20/2022  ? S/P TAVR (transcatheter aortic valve replacement)    ?  a. 03/2015 26 mm Edwards Sapien 3 transcatheter heart valve placed via open left transfemoral approach.  ? Severe aortic stenosis 08/10/2012  ? Skin lesions, generalized    ?  facial which may represent actinic keratoses and possible photosensitivity from Amiodarone  ? Unspecified essential hypertension    ?  ?  ?Has the patient had major surgery during 100 days prior to admission? No ?  ?Family History   ?family history includes Atrial fibrillation in his brother; Crohn's disease in his mother; Heart attack (age of onset: 55) in his father; Heart disease in his father and paternal uncle; Heart failure in his father; Prostate cancer in his paternal uncle; Stroke (age of onset: 45) in his mother. ?  ?Current Medications ?  ?Current Facility-Administered Medications:  ?  acetaminophen (TYLENOL)  tablet 650 mg, 650 mg, Oral, Q6H PRN, 650 mg at 03/02/22 1015 **OR** acetaminophen (TYLENOL) suppository 650 mg, 650 mg, Rectal, Q6H PRN, Shalhoub, Sherryll Burger, MD ?  albuterol (PROVENTIL) (2.5 MG/3ML) 0.083% nebulizer solution 3 mL, 3 mL, Inhalation, Q4H PRN, Shalhoub, Sherryll Burger, MD ?  amiodarone (PACERONE) tablet 400 mg, 400 mg, Oral, Daily, Shalhoub, Sherryll Burger, MD, 400 mg at 03/07/22 0948 ?  atorvastatin (LIPITOR) tablet 80 mg, 80 mg, Oral, Daily, Shalhoub, Sherryll Burger, MD, 80 mg at 03/07/22 0948 ?  clopidogrel (PLAVIX) tablet 75 mg, 75 mg, Oral, Daily, Shalhoub, Sherryll Burger, MD, 75 mg at 03/07/22 0948 ?  empagliflozin (JARDIANCE) tablet 10 mg, 10 mg, Oral, Daily, Arrien, Jimmy Picket, MD, 10 mg at 03/07/22 0948 ?  HYDROcodone-acetaminophen (NORCO/VICODIN) 5-325 MG per tablet 1 tablet, 1 tablet, Oral, Q6H PRN, Domenic Polite, MD, 1 tablet at 03/02/22 1347 ?  loratadine (CLARITIN) tablet 10 mg, 10 mg, Oral, Daily PRN, Shalhoub, Sherryll Burger, MD ?  melatonin tablet 10 mg, 10 mg, Oral, QHS PRN, Shalhoub, Sherryll Burger, MD ?  metoprolol succinate (TOPROL-XL) 24 hr tablet 25 mg, 25 mg, Oral, Daily, Domenic Polite, MD, 25 mg at 03/07/22 9735 ?  multivitamin with minerals tablet 1 tablet, 1 tablet, Oral, Daily, Shalhoub, Sherryll Burger, MD, 1 tablet at 03/07/22 3299 ?  nitroGLYCERIN (NITROSTAT) SL tablet 0.4 mg, 0.4 mg, Sublingual, Q5 min PRN, Shalhoub, Sherryll Burger, MD ?  ondansetron (ZOFRAN) tablet 4 mg, 4 mg, Oral, Q6H PRN **OR** ondansetron (ZOFRAN) injection 4 mg, 4 mg, Intravenous, Q6H PRN, Shalhoub, Sherryll Burger, MD ?  pantoprazole (PROTONIX) EC tablet 40 mg, 40 mg, Oral, Daily, Shalhoub, Sherryll Burger, MD, 40 mg at 03/07/22 0948 ?  polyethylene glycol (MIRALAX / GLYCOLAX) packet 17 g, 17 g, Oral, Daily PRN, Shalhoub, Sherryll Burger, MD ?  Derrill Memo ON 03/08/2022] spironolactone (ALDACTONE) tablet 25 mg, 25 mg, Oral, Daily, Domenic Polite, MD ?  warfarin (COUMADIN) tablet 4 mg, 4 mg, Oral, q1600, Kris Mouton, RPH, 4 mg at 03/06/22 1623 ?  Warfarin -  Pharmacist Dosing Inpatient, , Does not apply, q1600, Shalhoub, Sherryll Burger, MD, Given at 03/04/22 [phone] ?  ?Patients Current Diet:  ?Diet Order   ?  ?         ?    Diet - low sodium heart healthy       ?  ?  Diet Heart Room service appropriate? Yes; Fluid consistency: Thin  Diet effective now       ?  ?  ?   ?  ?  ?   ?  ?  ?Precautions / Restrictions ?Precautions ?Precautions: Fall ?Precaution Comments: BP ?Restrictions ?Weight Bearing Restrictions: No  ?  ?Has the patient had 2 or more falls or a fall with injury in the past year? No ?  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  ?Prior Functional Level ?Self Care: Did the patient need help bathing, dressing, using the toilet or eating? Independent ?  ?Indoor Mobility: Did the patient need assistance with walking from room to room (with or without device)? Independent ?  ?Stairs: Did the patient need assistance with internal or external stairs (with or without device)? Independent ?  ?Functional Cognition: Did the patient need help planning regular tasks such as shopping or remembering to take medications? Independent ?  ?Patient Information ?Are you of Hispanic, Latino/a,or Spanish origin?: A. No, not of Hispanic, Latino/a, or Spanish origin ?What is your race?: A. White ?Do you need or want an interpreter to communicate with a doctor or health care staff?: 0. No ?  ?Patient's Response To:  ?Health Literacy and Transportation ?Is the patient able to respond to health literacy and transportation needs?: Yes ?Health Literacy - How often do you need to have someone help you when you read instructions, pamphlets, or other written material from your doctor or pharmacy?: Never ?In the past 12 months, has lack of transportation kept you from medical appointments or from getting medications?: No ?In the past 12 months, has lack of transportation kept you from meetings, work, or from getting things  needed for daily living?: No ?  ?Home Assistive Devices / Equipment ?Home Equipment: Conservation officer, nature (2 wheels), [employer] - single point, Shower seat ?  ?Prior Device Use: Indicate devices/aids used by the patien

## 2022-03-08 NOTE — Progress Notes (Signed)
Mobility Specialist Progress Note  ? ? 03/08/22 1043  ?Mobility  ?Activity Ambulated independently in hallway  ?Level of Assistance Standby assist, set-up cues, supervision of patient - no hands on  ?Assistive Device None  ?Distance Ambulated (ft) 800 ft  ?Activity Response Tolerated well  ?$Mobility charge 1 Mobility  ? ?Pt received in bed and agreeable. No complaints on walk. Returned to chair with call bell in reach.   ? ?Jesus [name] ?Mobility Specialist  ?  ?

## 2022-03-08 NOTE — Progress Notes (Signed)
Physical Therapy Treatment ?Patient Details ?Name: Jesus [name] ?MRN: [id number] ?DOB: November 21, 1941 ?Today's Date: 03/08/2022 ? ? ?History of Present Illness The pt is an 81 yo male presenting 3/26 with SOB. Pt recently here for bilateral LE edema on 3/23 and DVT was ruled out so he was d/c home on 3/24. This admission pt found to have pulmonary edema and bilateral pleural effusions. PMH includes: CAD s/p CABG, NSTEMI 02/20/22 s/p cardiac cath with stent placement, ischemic cardiomyopathy, afib on coumadin, aortic stenosis s/p TAVR 2016, HLD, and HTN. ? ?  ?PT Comments  ? ? Pt received in recliner, asleep but easily awoken and eager to participate in PT session with good tolerance for functional mobility tasks and exercises for strengthening. Pt needing up to minA without AD for gait and pre-gait activities. Pt scored 14/24 on Dynamic Gait Index, indicating an increased risk of falls for community ambulation tasks. Pt with good activity tolerance and reports only minimal fatigue at end of session, although appearing more fatigued than reported with decreased dynamic standing balance toward end of session with balance challenges compared with initially. Pt continues to benefit from PT services to progress toward functional mobility goals.   ?Recommendations for follow up therapy are one component of a multi-disciplinary discharge planning process, led by the attending physician.  Recommendations may be updated based on patient status, additional functional criteria and insurance authorization. ? ?Follow Up Recommendations ? Acute inpatient rehab (3hours/day) (HHPT if pt decides to go home) ?  ?  ?Assistance Recommended at Discharge Intermittent Supervision/Assistance  ?Patient can return home with the following A little help with bathing/dressing/bathroom;Assist for transportation;Help with stairs or ramp for entrance;A lot of help with walking and/or transfers;Assistance with cooking/housework ?  ?Equipment  Recommendations ? None recommended by PT (recommend he continue to use home RW)  ?  ?Recommendations for Other Services   ? ? ?  ?Precautions / Restrictions Precautions ?Precautions: Fall ?Precaution Comments: BP ?Restrictions ?Weight Bearing Restrictions: No  ?  ? ?Mobility ? Bed Mobility ?  ?  ?  ?  ?  ?  ?  ?General bed mobility comments: received/remained in recliner ?  ? ?Transfers ?Overall transfer level: Needs assistance ?Equipment used: None ?Transfers: Sit to/from Stand ?Sit to Stand: Min guard, Supervision ?  ?  ?  ?  ?  ?General transfer comment: min guard with no AD to/from toilet, Supervision from chair with arms and min guard from chair without UE support ?  ? ?Ambulation/Gait ?Ambulation/Gait assistance: Min assist, Min guard ?Gait Distance (Feet): 250 Feet ?Assistive device: None ?Gait Pattern/deviations: Decreased stride length, Decreased stance time - left, Step-through pattern, Trunk flexed, Knee flexed in stance - left ?  ?  ?  ?General Gait Details: Pt with forward head/rounded shoulders posture and slightly decreased L stance time with mildly antalgic appearing gait but denies pain. minA with a few LOB without UE support especially with pre-gait standing hip flexion (unsupported). ? ? ?Stairs ?  ?Stairs assistance: Min assist, Min guard ?Stair Management: One rail Left, Alternating pattern, Forwards, Step to pattern, One rail Right ?Number of Stairs: 4 ?General stair comments: light min A for steadying, able to perform with step over step ascent and step to descent, does better stepping down with RLE leading ? ? ?Wheelchair Mobility ?  ? ?Modified Rankin (Stroke Patients Only) ?  ? ? ?  ?Balance Overall balance assessment: Needs assistance ?Sitting-balance support: No upper extremity supported ?Sitting balance-Leahy Scale: Good ?  ?  ?Standing  balance support: No upper extremity supported, Bilateral upper extremity supported, During functional activity ?Standing balance-Leahy Scale:  Fair ?Standing balance comment: LOB with 30 second marching task multiple times needing minA to recover; mostly min guard during gait trial ? Standardized Balance Assessment ?Standardized Balance Assessment : Dynamic Gait Index ?  ?Dynamic Gait Index ?Level Surface: Mild Impairment ?Change in Gait Speed: Mild Impairment ?Gait with Horizontal Head Turns: Moderate Impairment ?Gait with Vertical Head Turns: Mild Impairment ?Gait and Pivot Turn: Mild Impairment ?Step Over Obstacle: Mild Impairment ?Step Around Obstacles: Mild Impairment ?Steps: Moderate Impairment ?Total Score: 14 ?  ? ?  ?Cognition Arousal/Alertness: Awake/alert ?Behavior During Therapy: [facility] for tasks assessed/performed ?Overall Cognitive Status: Within Functional Limits for tasks assessed ? General Comments: motivated towards therapy and getting home, HoH even with hearing aids at times ?  ?  ? ?  ?Exercises Other Exercises ?Other Exercises: STS x 5 reps with arms crossed (from chair) ?Other Exercises: standing BLE AROM: hip flexion, hip extension, marches x10-15 reps ea ? ?  ?General Comments General comments (skin integrity, edema, etc.): SpO2 at times reading <88% on RA wtih exertion however noisy signal, when sensor changed to another reading, improved signal reads >94% throughout; no dyspnea; standing BP 118/57 (96); Reclined BP: 126/45 ?  ?  ? ?Pertinent Vitals/Pain Pain Assessment ?Pain Assessment: Faces ?Faces Pain Scale: Hurts a little bit ?Pain Location: not localized ?Pain Descriptors / Indicators: Discomfort, Guarding ?Pain Intervention(s): Monitored during session, Repositioned, Other (comment) (mildly antalgic gait pattern at times)  ? ? ?Home Living Family/patient expects to be discharged to:: Inpatient rehab ?  ?  ?  ?  ?   ?  ?   ? ?PT Goals (current goals can now be found in the care plan section) Acute Rehab PT Goals ?Patient Stated Goal: to go back to work ?PT Goal Formulation: With patient ?Time For Goal Achievement:  03/14/22 ?Progress towards PT goals: Progressing toward goals ? ?  ?Frequency ? ? ? Min 3X/week ? ? ? ?  ?PT Plan Current plan remains appropriate  ? ? ?   ?AM-PAC PT "6 Clicks" Mobility   ?Outcome Measure ? Help needed turning from your back to your side while in a flat bed without using bedrails?: None ?Help needed moving from lying on your back to sitting on the side of a flat bed without using bedrails?: A Little ?Help needed moving to and from a bed to a chair (including a wheelchair)?: A Little ?Help needed standing up from a chair using your arms (e.g., wheelchair or bedside chair)?: A Little ?Help needed to walk in hospital room?: A Lot (requires mod cuing for safety) ?Help needed climbing 3-5 steps with a railing? : A Lot (requires mod cuing for safe ascent) ?6 Click Score: 17 ? ?  ?End of Session Equipment Utilized During Treatment: Gait belt ?Activity Tolerance: Patient tolerated treatment well ?Patient left: in chair;with call bell/phone within reach ?Nurse Communication: Mobility status ?PT Visit Diagnosis: Other abnormalities of gait and mobility (R26.89);Unsteadiness on feet (R26.81);Difficulty in walking, not elsewhere classified (R26.2) ?  ? ? ?Time: 1478-2956 ?PT Time Calculation (min) (ACUTE ONLY): 30 min ? ?Charges:  $Gait Training: 8-22 mins ?$Therapeutic Exercise: 8-22 mins          ?          ? ?Jesus Ulatowski P., PTA ?Acute Rehabilitation Services ?Secure Chat Preferred 9a-5:30pm ?Office: [phone]  ? ? ?Jesus [name] ?03/08/2022, 2:37 PM ? ?

## 2022-03-08 NOTE — H&P (Signed)
?  ?Physical Medicine and Rehabilitation Admission H&P ?  ?  ?   ?Chief Complaint  ?Patient presents with  ? Debility  ?  ?  ?HPI: Jesus [name] is an 81 year old male with history of PAF- on coumadin, TVAR, HTN, CAD s/p CABG, recent admission 02/15/22 with CP due to NSTEMI and underwent PTCA/DES of SVG to PDA. He was readmitted on 02/27/22 with progressive SOB with peripheral edema and bilateral pleural effusions due to fluid overalod. He was treated with IV diuresis with improvement in symptoms but developed acute on chronic renal failure with orthostatic hypotension. Amlodipine and losartan were discontinued and diuretics placed on hold. Iron deficiency anemia treated with IV iron supplementation. Orthostatic symptoms/leg pain improving with BP trending upwards therefore lasix resumed on 04/01 but he did have recurrent rise in BUN/SCr 1.6 so lasix was held X 1 and resumed at 20 mg daily today. Dizziness resolving with improvement in respiratory status but he continues to be limited by weakness with balance deficits. CIR recommended due to functional decline.  ?  ?  ?Review of Systems  ?Constitutional:  Negative for chills and fever.  ?HENT:  Negative for hearing loss and tinnitus.   ?Eyes:  Negative for double vision.  ?Respiratory:  Positive for shortness of breath. Negative for cough.   ?Cardiovascular:  Negative for leg swelling.  ?Gastrointestinal:  Negative for nausea and vomiting.  ?Genitourinary:  Negative for dysuria and urgency.  ?Musculoskeletal:  Negative for myalgias.  ?Neurological:  Positive for weakness. Negative for sensory change and speech change.  ?Psychiatric/Behavioral:  Negative for depression and suicidal ideas.   ?  ?  ?    ?Past Medical History:  ?Diagnosis Date  ? 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 Aortic stenosis    ?  mild with a mean aortic valve gradient of 12 mmHg  ? Atrial fibrillation ([place])    ?  holding sinus rhythm on Amiodarone  ? CAD (coronary  artery disease)    ?  a. S/P Ant MI 1980;  b. 1997 S/P CABG x 8 (LIMA to diag-LAD, SVG to OM1-OM2-OM3, SVG to [facility] - Dr [name]);  c. 01/2015 Cath: 3VD, 8/8 patent grafts.  ? Cardiomyopathy    ? Dysrhythmia    ? Esophageal reflux    ? GERD (gastroesophageal reflux disease)    ? Headache    ? History of colonoscopy    ? History of transesophageal echocardiography (TEE) for monitoring    ? Other and unspecified hyperlipidemia    ? Paroxysmal atrial fibrillation ([place]) 02/20/2022  ? S/P angioplasty with stent 02/17/22 DES to proximal VG to PDA 02/20/2022  ? S/P TAVR (transcatheter aortic valve replacement)    ?  a. 03/2015 26 mm Edwards Sapien 3 transcatheter heart valve placed via open left transfemoral approach.  ? Severe aortic stenosis 08/10/2012  ? Skin lesions, generalized    ?  facial which may represent actinic keratoses and possible photosensitivity from Amiodarone  ? Unspecified essential hypertension    ?  ?  ?     ?Past Surgical History:  ?Procedure Laterality Date  ? CARDIAC CATHETERIZATION      ? CARDIOVERSION   11/18/2006  ?  Dr. [name]  ? CATARACT EXTRACTION W/ INTRAOCULAR LENS IMPLANT   April '13  (Dr. [name])  ?  left eye only  ? CHOLECYSTECTOMY      ? CORONARY ARTERY BYPASS GRAFT   02/09/1996  ?  LIMA to diag-LAD, SVG  to OM1-OM2-OM3, SVG to [facility]  ? CORONARY STENT INTERVENTION N/A 02/17/2022  ?  Procedure: CORONARY STENT INTERVENTION;  Surgeon: Burnell Blanks, MD;  Location: [place] CV LAB;  Service: Cardiovascular;  Laterality: N/A;  ? CORONARY/GRAFT ANGIOGRAPHY N/A 02/17/2022  ?  Procedure: CORONARY/GRAFT ANGIOGRAPHY;  Surgeon: Burnell Blanks, MD;  Location: [place] CV LAB;  Service: Cardiovascular;  Laterality: N/A;  ? EYE SURGERY      ? LEFT AND RIGHT HEART CATHETERIZATION WITH CORONARY/GRAFT ANGIOGRAM N/A 01/26/2015  ?  Procedure: LEFT AND RIGHT HEART CATHETERIZATION WITH [name];  Surgeon: [name], MD;  Location: [facility] CATH LAB;  Service:  Cardiovascular;  Laterality: N/A;  ? TEE WITHOUT CARDIOVERSION N/A 03/10/2015  ?  Procedure: TRANSESOPHAGEAL ECHOCARDIOGRAM (TEE);  Surgeon: [name], MD;  Location: [place];  Service: Open Heart Surgery;  Laterality: N/A;  ? TONSILLECTOMY      ? TRANSCATHETER AORTIC VALVE REPLACEMENT, TRANSFEMORAL N/A 03/10/2015  ?  Procedure: TRANSCATHETER AORTIC VALVE REPLACEMENT, TRANSFEMORAL;  Surgeon: [name], MD;  Location: [place];  Service: Open Heart Surgery;  Laterality: N/A;  ?  ?  ?     ?Family History  ?Problem Relation Age of Onset  ? Stroke Mother 66  ?      Deceased  ? Crohn's disease Mother    ?      Deceased  ? Heart attack Father 50  ?      Deceased  ? Heart disease Father    ? Heart failure Father    ?      Deceased  ? Atrial fibrillation Brother    ? Heart disease Paternal Uncle    ? Prostate cancer Paternal Uncle    ? Colon cancer Neg Hx    ?  ?  ?Social History:  reports that he has never smoked. He has never used smokeless tobacco. He reports that he does not drink alcohol and does not use drugs. ?  ?  ?     ?Allergies  ?Allergen Reactions  ? Codeine Other (See Comments)  ?    Pt does not remember  ? Iodine Swelling  ?  ?  ?      ?Medications Prior to Admission  ?Medication Sig Dispense Refill  ? albuterol (VENTOLIN HFA) 108 (90 Base) MCG/ACT inhaler INHALE 2 PUFFS BY MOUTH EVERY 4 HOURS ASNEEDED FOR WHEEZING OR SHORTNESS OF BREATH. (Patient taking differently: 2 puffs every 4 (four) hours as needed for wheezing or shortness of breath.) 6.7 g 1  ? amiodarone (PACERONE) 200 MG tablet Take 2 tablets (400 mg total) by mouth 2 (two) times daily. Take 2 tabs ( 400 mg total) twice a day for 7 days then take 400 mg daily. (Patient taking differently: Take 400 mg by mouth daily.) 80 tablet 3  ? amLODipine (NORVASC) 10 MG tablet Take 1 tablet (10 mg total) by mouth daily. 30 tablet 6  ? amoxicillin (AMOXIL) 400 MG/5ML suspension TAKE 5 TEASPOONFULS BY MOUTH 1 HOUR PRIOR TO DENTAL PROCEDURE (Patient taking  differently: Take 2,000 mg by mouth See admin instructions. TAKE 5 TEASPOONFULS BY MOUTH 1 HOUR PRIOR TO DENTAL PROCEDURE) 100 mL 0  ? Ascorbic Acid (VITAMIN C) 1000 MG tablet Take 1,000 mg by mouth daily.      ? aspirin 81 MG tablet Take 81 mg by mouth at bedtime.      ? atorvastatin (LIPITOR) 80 MG tablet Take 1 tablet (80 mg total) by mouth daily. 30 tablet 6  ?  clopidogrel (PLAVIX) 75 MG tablet Take 1 tablet (75 mg total) by mouth daily with breakfast. 30 tablet 11  ? diclofenac Sodium (VOLTAREN) 1 % GEL Apply 4 g topically 4 (four) times daily. 100 g 0  ? loratadine (CLARITIN) 10 MG tablet Take 10 mg by mouth daily as needed for allergies.      ? losartan (COZAAR) 50 MG tablet Take 1 tablet (50 mg total) by mouth daily. 30 tablet 6  ? metoprolol tartrate (LOPRESSOR) 25 MG tablet Take 1 tablet (25 mg total) by mouth 2 (two) times daily. 60 tablet 6  ? Multiple Vitamins-Minerals (MULTIVITAMIN,TX-MINERALS) tablet Take 1 tablet by mouth daily.      ? nitroGLYCERIN (NITROSTAT) 0.4 MG SL tablet Place 1 tablet (0.4 mg total) under the tongue every 5 (five) minutes x 3 doses as needed for chest pain. (Patient taking differently: Place 0.4 mg under the tongue every 5 (five) minutes as needed for chest pain.) 25 tablet 4  ? warfarin (COUMADIN) 4 MG tablet TAKE 1 TABLET BY MOUTH DAILY OR TAKE AS DIRECTED BY ANTICOAGULATION CLINIC (Patient taking differently: Take 4 mg by mouth every evening.) 105 tablet 1  ?  ?  ?  ?  ?Home: ?Home Living ?Family/patient expects to be discharged to:: Private residence ?Living Arrangements: Alone ?Available Help at Discharge: Family, Available 24 hours/day ?Type of Home: House ?Home Access: Stairs to enter ?Entrance Stairs-Number of Steps: 4 ?Entrance Stairs-Rails: Right, Left, Can reach both ?Home Layout: One level ?Bathroom Shower/Tub: Walk-in shower ?Bathroom Toilet: Standard ?Home Equipment: Conservation officer, nature (2 wheels), [employer] - single point, Shower seat ?Additional Comments: pt plans to  have assist from brother's wife. Pt's wife passed June 2022 ?  ?Functional History: ?Prior Function ?Prior Level of Function : Independent/Modified Independent, Working/employed, Driving ?Mobility Comments: pt re

## 2022-03-09 ENCOUNTER — Encounter (HOSPITAL_COMMUNITY): Payer: Medicare Other

## 2022-03-09 LAB — CBC WITH DIFFERENTIAL/PLATELET
Abs Immature Granulocytes: 0.04 10*3/uL (ref 0.00–0.07)
Basophils Absolute: 0.1 10*3/uL (ref 0.0–0.1)
Basophils Relative: 1 %
Eosinophils Absolute: 0.2 10*3/uL (ref 0.0–0.5)
Eosinophils Relative: 2 %
HCT: 33.8 % — ABNORMAL LOW (ref 39.0–52.0)
Hemoglobin: 11.2 g/dL — ABNORMAL LOW (ref 13.0–17.0)
Immature Granulocytes: 0 %
Lymphocytes Relative: 14 %
Lymphs Abs: 1.4 10*3/uL (ref 0.7–4.0)
MCH: 32.8 pg (ref 26.0–34.0)
MCHC: 33.1 g/dL (ref 30.0–36.0)
MCV: 99.1 fL (ref 80.0–100.0)
Monocytes Absolute: 1.2 10*3/uL — ABNORMAL HIGH (ref 0.1–1.0)
Monocytes Relative: 12 %
Neutro Abs: 7.1 10*3/uL (ref 1.7–7.7)
Neutrophils Relative %: 71 %
Platelets: 229 10*3/uL (ref 150–400)
RBC: 3.41 MIL/uL — ABNORMAL LOW (ref 4.22–5.81)
RDW: 12.8 % (ref 11.5–15.5)
WBC: 10 10*3/uL (ref 4.0–10.5)
nRBC: 0 % (ref 0.0–0.2)

## 2022-03-09 LAB — COMPREHENSIVE METABOLIC PANEL
ALT: 31 U/L (ref 0–44)
AST: 22 U/L (ref 15–41)
Albumin: 2.8 g/dL — ABNORMAL LOW (ref 3.5–5.0)
Alkaline Phosphatase: 78 U/L (ref 38–126)
Anion gap: 7 (ref 5–15)
BUN: 28 mg/dL — ABNORMAL HIGH (ref 8–23)
CO2: 24 mmol/L (ref 22–32)
Calcium: 8.9 mg/dL (ref 8.9–10.3)
Chloride: 104 mmol/L (ref 98–111)
Creatinine, Ser: 1.4 mg/dL — ABNORMAL HIGH (ref 0.61–1.24)
GFR, Estimated: 50 mL/min — ABNORMAL LOW (ref >60)
Glucose, Bld: 96 mg/dL (ref 70–99)
Potassium: 4.3 mmol/L (ref 3.5–5.1)
Sodium: 135 mmol/L (ref 135–145)
Total Bilirubin: 0.3 mg/dL (ref 0.3–1.2)
Total Protein: 6.3 g/dL — ABNORMAL LOW (ref 6.5–8.1)

## 2022-03-09 LAB — PROTIME-INR
INR: 2.2 — ABNORMAL HIGH (ref 0.8–1.2)
Prothrombin Time: 24.5 seconds — ABNORMAL HIGH (ref 11.4–15.2)

## 2022-03-09 NOTE — Discharge Instructions (Addendum)
Inpatient Rehab Discharge Instructions ? ?Jesus [name] ?Discharge date and time:   ? ?Activities/Precautions/ Functional Status: ?Activity: no lifting, driving, or strenuous exercise till cleared by MD ?Diet: cardiac diet ?Wound Care: none needed ? ? ?Functional status:  ?___ No restrictions     ___ Walk up steps independently ?___ 24/7 supervision/assistance   ___ Walk up steps with assistance ?___ Intermittent supervision/assistance  ___ Bathe/dress independently ?___ Walk with walker     ___ Bathe/dress with assistance ?___ Walk Independently    ___ Shower independently ?___ Walk with assistance    ___ Shower with assistance ?_X__ No alcohol     ___ Return to work/school ________ ? ? ?Special Instructions: ?Need to have coumadin check next Monday. ? ? ?COMMUNITY REFERRALS UPON DISCHARGE:   ? ?Home Health:   PT & RN  ?               [place] Phone: [phone]  ? ?Medical Equipment/Items Ordered: CANE ?                                                Agency/Supplier: [facility] [employer]   [phone] ? ? ? ? ?My questions have been answered and I understand these instructions. I will adhere to these goals and the provided educational materials after my discharge from the hospital. ? ?Patient/Caregiver Signature _______________________________ Date __________ ? ?Clinician Signature _______________________________________ Date __________ ? ?Please bring this form and your medication list with you to all your follow-up doctor's appointments.   ?

## 2022-03-09 NOTE — Plan of Care (Signed)
?  Problem: RH Balance ?Goal: LTG Patient will maintain dynamic sitting balance (PT) ?Description: LTG:  Patient will maintain dynamic sitting balance with assistance during mobility activities (PT) ?Flowsheets (Taken 03/09/2022 1241) ?LTG: Pt will maintain dynamic sitting balance during mobility activities with:: Independent ?Goal: LTG Patient will maintain dynamic standing balance (PT) ?Description: LTG:  Patient will maintain dynamic standing balance with assistance during mobility activities (PT) ?Flowsheets (Taken 03/09/2022 1241) ?LTG: Pt will maintain dynamic standing balance during mobility activities with:: Independent with assistive device  ?  ?Problem: Sit to Stand ?Goal: LTG:  Patient will perform sit to stand with assistance level (PT) ?Description: LTG:  Patient will perform sit to stand with assistance level (PT) ?Flowsheets (Taken 03/09/2022 1241) ?LTG: PT will perform sit to stand in preparation for functional mobility with assistance level: Independent with assistive device ?  ?Problem: RH Bed Mobility ?Goal: LTG Patient will perform bed mobility with assist (PT) ?Description: LTG: Patient will perform bed mobility with assistance, with/without cues (PT). ?Flowsheets (Taken 03/09/2022 1241) ?LTG: Pt will perform bed mobility with assistance level of: Independent ?  ?Problem: RH Bed to Chair Transfers ?Goal: LTG Patient will perform bed/chair transfers w/assist (PT) ?Description: LTG: Patient will perform bed to chair transfers with assistance (PT). ?Flowsheets (Taken 03/09/2022 1241) ?LTG: Pt will perform Bed to Chair Transfers with assistance level: Independent with assistive device  ?  ?Problem: RH Car Transfers ?Goal: LTG Patient will perform car transfers with assist (PT) ?Description: LTG: Patient will perform car transfers with assistance (PT). ?Flowsheets (Taken 03/09/2022 1241) ?LTG: Pt will perform car transfers with assist:: Independent with assistive device  ?  ?Problem: RH Ambulation ?Goal: LTG  Patient will ambulate in controlled environment (PT) ?Description: LTG: Patient will ambulate in a controlled environment, # of feet with assistance (PT). ?Flowsheets (Taken 03/09/2022 1241) ?LTG: Pt will ambulate in controlled environ  assist needed:: Independent with assistive device ?LTG: Ambulation distance in controlled environment: 150 ?Goal: LTG Patient will ambulate in home environment (PT) ?Description: LTG: Patient will ambulate in home environment, # of feet with assistance (PT). ?Flowsheets (Taken 03/09/2022 1241) ?LTG: Pt will ambulate in home environ  assist needed:: Independent with assistive device ?LTG: Ambulation distance in home environment: 50 ?  ?Problem: RH Stairs ?Goal: LTG Patient will ambulate up and down stairs w/assist (PT) ?Description: LTG: Patient will ambulate up and down # of stairs with assistance (PT) ?Flowsheets (Taken 03/09/2022 1241) ?LTG: Pt will ambulate up/down stairs assist needed:: Independent with assistive device ?LTG: Pt will  ambulate up and down number of stairs: 4 ?Note: HR per home set up  ?  ?

## 2022-03-09 NOTE — Progress Notes (Signed)
Inpatient Rehabilitation Center ?Individual Statement of Services ? ?Patient Name:  Jesus [name]  ?Date:  03/09/2022 ? ?Welcome to the [facility].  Our goal is to provide you with an individualized program based on your diagnosis and situation, designed to meet your specific needs.  With this comprehensive rehabilitation program, you will be expected to participate in at least 3 hours of rehabilitation therapies Monday-Friday, with modified therapy programming on the weekends. ? ?Your rehabilitation program will include the following services:  Physical Therapy (PT), Occupational Therapy (OT), 24 hour per day rehabilitation nursing, Care Coordinator, Rehabilitation Medicine, Nutrition Services, and Pharmacy Services ? ?Weekly team conferences will be held on Tuesday to discuss your progress.  Your Inpatient Rehabilitation Care Coordinator will talk with you frequently to get your input and to update you on team discussions.  Team conferences with you and your family in attendance may also be held. ? ?Expected length of stay: 7-8 days  Overall anticipated outcome: Independent with device ? ?Depending on your progress and recovery, your program may change. Your Inpatient Rehabilitation Care Coordinator will coordinate services and will keep you informed of any changes. Your Inpatient Rehabilitation Care Coordinator's name and contact numbers are listed  below. ? ?The following services may also be recommended but are not provided by the [facility]:  ?Driving Evaluations ?Home Health Rehabiltiation Services ?Outpatient Rehabilitation Services ?Vocational Rehabilitation ?  ?Arrangements will be made to provide these services after discharge if needed.  Arrangements include referral to agencies that provide these services. ? ?Your insurance has been verified to be:  [employer]-Medicare ?Your primary doctor is:  Darnell Level [name] ? ?Pertinent information will be shared with your doctor  and your insurance company. ? ?Inpatient Rehabilitation Care Coordinator:  Ovidio Kin, [place] or (C) [phone] ? ?Information discussed with and copy given to patient by: Elease Hashimoto, 03/09/2022, 9:49 AM    ?

## 2022-03-09 NOTE — Progress Notes (Signed)
?                                                       PROGRESS NOTE ? ? ?Subjective/Complaints: ? ?Slept OK- went to bathroom overnight, but slept OK.  ? ?LBM yesterday - was small- no constipation- not eating a lot.  ? ?No pain except R hip soreness.  ? ? ? ?ROS: ? ?Pt denies SOB, abd pain, CP, N/V/C/D, and vision changes ? ?Objective: ?  ?No results found. ?Recent Labs  ?  03/09/22 ?0505  ?WBC 10.0  ?HGB 11.2*  ?HCT 33.8*  ?PLT 229  ? ?Recent Labs  ?  03/08/22 ?0041 03/09/22 ?0505  ?NA 139 135  ?K 4.4 4.3  ?CL 108 104  ?CO2 23 24  ?GLUCOSE 104* 96  ?BUN 31* 28*  ?CREATININE 1.36* 1.40*  ?CALCIUM 8.7* 8.9  ? ? ?Intake/Output Summary (Last 24 hours) at 03/09/2022 1101 ?Last data filed at 03/08/2022 1838 ?Gross per 24 hour  ?Intake 120 ml  ?Output --  ?Net 120 ml  ?  ? ?  ? ?Physical Exam: ?Vital Signs ?Blood pressure (!) 139/51, pulse 61, temperature 98.3 ?F (36.8 ?C), resp. rate 14, height '5\' 1"'$  (1.549 m), weight 55.3 kg, SpO2 95 %. ? ? ? ?General: awake, alert, appropriate, supine in bed- just woke up; NAD ?HENT: conjugate gaze; oropharynx moist ?CV: regular rate; no JVD ?Pulmonary: CTA B/L; no W/R/R- good air movement ?GI: soft, NT, ND, (+)BS ?Psychiatric: appropriate ?Neurological: alert ?Musculoskeletal:     ?   General: No swelling or tenderness. Normal range of motion.  ?   Cervical back: Normal range of motion and neck supple.  ?Skin: ?   General: Skin is warm and dry.  ?   Findings: No rash.  ?Neurological:  ?   Mental Status: He is alert and oriented to person, place, and time.  ?   Cranial Nerves: No cranial nerve deficit.  ?   Sensory: No sensory deficit.  ?   Comments: Reasonable insight and awareness. Functional memory. Normal language and speech. UE 4+/5 prox to distal. LE: 3/5 HF, 4-/5 KE and 4/5 ADF/PF. Normal reflexes. No abnl tone. Normal cerebellar exam ? ?Assessment/Plan: ?1. Functional deficits which require 3+ hours per day of interdisciplinary therapy in a comprehensive inpatient rehab  setting. ?Physiatrist is providing close team supervision and 24 hour management of active medical problems listed below. ?Physiatrist and rehab team continue to assess barriers to discharge/monitor patient progress toward functional and medical goals ? ?Care Tool: ? ?Bathing ? Bathing activity did not occur: Safety/medical concerns ?Body parts bathed by patient: Right arm, Left arm, Chest, Abdomen, Front perineal area, Buttocks, Right upper leg, Left upper leg, Right lower leg, Left lower leg, Face  ?   ?  ?  ?Bathing assist Assist Level: Contact Guard/Touching assist ?  ?  ?Upper Body Dressing/Undressing ?Upper body dressing   ?What is the patient wearing?: Pull over shirt ?   ?Upper body assist Assist Level: Contact Guard/Touching assist (standing) ?   ?Lower Body Dressing/Undressing ?Lower body dressing ? ? ? Lower body dressing activity did not occur: Safety/medical concerns ?What is the patient wearing?: Pants, Underwear/pull up ? ?  ? ?Lower body assist Assist for lower body dressing: Contact Guard/Touching assist ?   ? ?Toileting ?Toileting    ?  Toileting assist Assist for toileting: (P) Contact Guard/Touching assist ?  ?  ?Transfers ?Chair/bed transfer ? ?Transfers assist ?   ? ?Chair/bed transfer assist level: Minimal Assistance - Patient > 75% ?  ?  ?Locomotion ?Ambulation ? ? ?Ambulation assist ? ?   ? ?Assist level: Contact Guard/Touching assist ?  ?   ? ?Walk 10 feet activity ? ? ?Assist ?   ? ?Assist level: Contact Guard/Touching assist ?Assistive device: Walker-standard  ? ?Walk 50 feet activity ? ? ?Assist   ? ?  ?   ? ? ?Walk 150 feet activity ? ? ?Assist   ? ?  ?  ?  ? ?Walk 10 feet on uneven surface  ?activity ? ? ?Assist   ? ? ?  ?   ? ?Wheelchair ? ? ? ? ?Assist   ?  ?  ? ?  ?   ? ? ?Wheelchair 50 feet with 2 turns activity ? ? ? ?Assist ? ?  ?  ? ? ?   ? ?Wheelchair 150 feet activity  ? ? ? ?Assist ?   ? ? ?   ? ?Blood pressure (!) 139/51, pulse 61, temperature 98.3 ?F (36.8 ?C), resp. rate 14,  height '5\' 1"'$  (1.549 m), weight 55.3 kg, SpO2 95 %. ? ?Medical Problem List and Plan: ?1. Functional deficits secondary to debility after systolic congestive heart failure and other medical complications ?            -patient may shower ?            -ELOS/Goals: 5-7 days, Mod I for PT , OT ? First day of evaluations of PT and OT- con't CIR ?2.  Antithrombotics: ?-DVT/anticoagulation:  Pharmaceutical: Coumadin-INR therapeutic.  ?            -antiplatelet therapy: N/A ?3. Pain Management:  Tylenol prn.  ?4. Mood: LCSW to follow for evaluation and support.  ?            -antipsychotic agents: N/A ?5. Neuropsych: This patient is capable of making decisions on his own behalf. ?6. Skin/Wound Care: Routine pressure relief measures. ?7. Fluids/Electrolytes/Nutrition: Strict I/O. Check CMET in am  ?8. 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   ?            --Wt improved from 132 lbs-->122 (which is average overall) ?            --on Jardiance, Lasix, metoprolol, aldactone and Plavix. No ARB due to CKD ?            -daily weights ? 4/5- weight stable to slightly less- con't to monitor ?9: Orthostatic hypotension: Will continue to monitor orthostatic vitals. ?--TEDs for edema control and support.  ?4/5- not out of Bed yet, but will monitor for Sx's.  ?10. 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 Continue metoprolol and amiodarone.  ?            --coumadin per pharmacy ?12. AKI: BUN/SCr 29/1.57 at admission likely due to recent PTCA/dyte ?--Was WNL prior to cath 03/16 (past three months per Epic.) ?--SCr stabilizing to 1.3-1.4 range.  ?4/5- Cr 1.4-- in normal range for pt- con't to monitor ?13. GERD: Continue protonix ?14. Iron deficiency anemia: Has been supplemented with Ferric gluconate 250 mg X 2 doses ?--monitor H/H with serial checks.   ?  ?  ?  ? ?LOS: ?1 days ?A FACE TO FACE EVALUATION WAS PERFORMED ? ?  Lylith Bebeau ?03/09/2022, 11:01 AM  ? ? ? ?

## 2022-03-09 NOTE — Progress Notes (Signed)
Inpatient Rehabilitation  Patient information reviewed and entered into eRehab system by Rocky Gladden M. Emileigh Kellett, M.A., CCC/SLP, PPS Coordinator.  Information including medical coding, functional ability and quality indicators will be reviewed and updated through discharge.    

## 2022-03-09 NOTE — Progress Notes (Signed)
Physical Therapy Session Note ? ?Patient Details  ?Name: Jesus [name] ?MRN: [id number] ?Date of Birth: July 31, 1941 ? ?Today's Date: 03/09/2022 ?PT Individual Time: 9629-5284 ?PT Individual Time Calculation (min): 42 min  ? ?Short Term Goals: ?Week 1:  PT Short Term Goal 1 (Week 1): STG= LTG based on ELOS ? ?Skilled Therapeutic Interventions/Progress Updates:  ?Patient received sitting up in recliner, agreeable to PT. He denies pain. Patient ambulating to dayroom with RW and close supervision. He completed 12 mins on NuStep with B UE and LE on level 3 for improved CV endurance. Patient able to maintain dynamic balance tossing beanbags at [employer]. Able to hold bucket and pick beanbags up off the floor/board with light CGA and no LOB. Patient completing this task x2 with seated rest break between. Patient ambulating back to his room with RW and close supervision. Requesting to use the bathroom. Patient with continent void and supervision for clothing management in standing. Patient returning to recliner, seatbelt alarm on, call light within reach.  ? ?Therapy Documentation ?Precautions:  ?Precautions ?Precautions: Fall ?Precaution Comments: BP ?Restrictions ?Weight Bearing Restrictions: No ? ? ? ?Therapy/Group: Individual Therapy ? ?Debbora Dus ?Debbora Dus, PT, DPT, CBIS ? ?03/09/2022, 2:57 PM  ?

## 2022-03-09 NOTE — Evaluation (Signed)
Physical Therapy Assessment and Plan ? ?Patient Details  ?Name: Jesus [name] ?MRN: [id number] ?Date of Birth: [birth year] ? ?PT Diagnosis: Abnormal posture, Abnormality of gait, and Muscle weakness ?Rehab Potential: Good ?ELOS: 5-7 days  ? ?Today's Date: 03/09/2022 ?PT Individual Time: [id number] ?PT Individual Time Calculation (min): 70 min   ? ?Hospital Problem: Principal Problem: ?  Debility ? ? ?Past Medical History:  ?Past Medical History:  ?Diagnosis Date  ? Acute myocardial infarction of inferior wall ([place]) 1980  ? Aortic stenosis   ? mild with a mean aortic valve gradient of 12 mmHg  ? Atrial fibrillation ([place])   ? holding sinus rhythm on Amiodarone  ? CAD (coronary artery disease)   ? a. S/P Ant MI 1980;  b. 1997 S/P CABG x 8 (LIMA to diag-LAD, SVG to OM1-OM2-OM3, SVG to [facility] - Dr [name]);  c. 01/2015 Cath: 3VD, 8/8 patent grafts.  ? Cardiomyopathy   ? Dysrhythmia   ? Esophageal reflux   ? GERD (gastroesophageal reflux disease)   ? Headache   ? History of colonoscopy   ? History of transesophageal echocardiography (TEE) for monitoring   ? Other and unspecified hyperlipidemia   ? Paroxysmal atrial fibrillation ([place]) 02/20/2022  ? S/P angioplasty with stent 02/17/22 DES to proximal VG to PDA 02/20/2022  ? S/P TAVR (transcatheter aortic valve replacement)   ? a. 03/2015 26 mm Edwards Sapien 3 transcatheter heart valve placed via open left transfemoral approach.  ? Severe aortic stenosis 08/10/2012  ? Skin lesions, generalized   ? facial which may represent actinic keratoses and possible photosensitivity from Amiodarone  ? Unspecified essential hypertension   ? ?Past Surgical History:  ?Past Surgical History:  ?Procedure Laterality Date  ? CARDIAC CATHETERIZATION    ? CARDIOVERSION  11/18/2006  ? Dr. [name]  ? CATARACT EXTRACTION W/ INTRAOCULAR LENS IMPLANT  April '13  (Dr. [name])  ? left eye only  ? CHOLECYSTECTOMY    ? CORONARY ARTERY BYPASS GRAFT  02/09/1996  ? LIMA to diag-LAD, SVG to OM1-OM2-OM3, SVG to  [facility]  ? CORONARY STENT INTERVENTION N/A 02/17/2022  ? Procedure: CORONARY STENT INTERVENTION;  Surgeon: Burnell Blanks, MD;  Location: [place] CV [facility];  Service: Cardiovascular;  Laterality: N/A;  ? CORONARY/GRAFT ANGIOGRAPHY N/A 02/17/2022  ? Procedure: CORONARY/GRAFT ANGIOGRAPHY;  Surgeon: Burnell Blanks, MD;  Location: [place] [facility];  Service: Cardiovascular;  Laterality: N/A;  ? EYE SURGERY    ? LEFT AND RIGHT HEART CATHETERIZATION WITH CORONARY/GRAFT ANGIOGRAM N/A 01/26/2015  ? Procedure: LEFT AND RIGHT HEART CATHETERIZATION WITH [name];  Surgeon: [name], MD;  Location: [facility] CATH LAB;  Service: Cardiovascular;  Laterality: N/A;  ? TEE WITHOUT CARDIOVERSION N/A 03/10/2015  ? Procedure: TRANSESOPHAGEAL ECHOCARDIOGRAM (TEE);  Surgeon: [name], MD;  Location: [place];  Service: Open Heart Surgery;  Laterality: N/A;  ? TONSILLECTOMY    ? TRANSCATHETER AORTIC VALVE REPLACEMENT, TRANSFEMORAL N/A 03/10/2015  ? Procedure: TRANSCATHETER AORTIC VALVE REPLACEMENT, TRANSFEMORAL;  Surgeon: [name], MD;  Location: [place];  Service: Open Heart Surgery;  Laterality: N/A;  ? ? ?Assessment & Plan ?Clinical Impression: Patient is a 81 y.o. year old male with history of PAF- on coumadin, TVAR, HTN, CAD s/p CABG, recent admission 02/15/22 with CP due to NSTEMI and underwent PTCA/DES of SVG to PDA. He was readmitted on 02/27/22 with progressive SOB with peripheral edema and bilateral pleural effusions due to fluid overalod. He was treated with IV diuresis with improvement in symptoms but  developed acute on chronic renal failure with orthostatic hypotension. Amlodipine and losartan were discontinued and diuretics placed on hold. Iron deficiency anemia treated with IV iron supplementation. Orthostatic symptoms/leg pain improving with BP trending upwards therefore lasix resumed on 04/01 but he did have recurrent rise in BUN/SCr 1.6 so lasix was held X 1 and resumed at 20 mg  daily today. Dizziness resolving with improvement in respiratory status but he continues to be limited by weakness with balance deficits. CIR recommended due to functional decline. ? ?Patient currently requires  CGA  with mobility secondary to muscle weakness, decreased cardiorespiratoy endurance, and decreased standing balance, decreased postural control, and decreased balance strategies.  Prior to hospitalization, patient was independent  with mobility and lived with Alone in a House home.  Home access is 4Stairs to enter. ? ?Patient will benefit from skilled PT intervention to maximize safe functional mobility and minimize fall risk for planned discharge home alone.  Anticipate patient will benefit from follow up OP at discharge. ? ?PT - End of Session ?Activity Tolerance: Tolerates 30+ min activity with multiple rests ?Endurance Deficit: Yes ?PT Assessment ?Rehab Potential (ACUTE/IP ONLY): Good ?PT Barriers to Discharge: Decreased caregiver support;Lack of/limited family support ?PT Patient demonstrates impairments in the following area(s): Balance;Endurance;Motor;Nutrition;Pain;Safety;Sensory;Skin Integrity ?PT Transfers Functional Problem(s): Bed Mobility;Bed to Chair;Car;Furniture ?PT Locomotion Functional Problem(s): Ambulation;Stairs ?PT Plan ?PT Intensity: Minimum of 1-2 x/day ,45 to 90 minutes ?PT Frequency: 5 out of 7 days ?PT Duration Estimated Length of Stay: 5-7 days ?PT Treatment/Interventions: Ambulation/gait training;Discharge planning;Functional mobility training;Psychosocial support;Therapeutic Activities;Visual/perceptual remediation/compensation;Balance/vestibular training;Disease management/prevention;Neuromuscular re-education;Skin care/wound management;Therapeutic Exercise;Cognitive remediation/compensation;DME/adaptive equipment instruction;Pain management;Splinting/orthotics;UE/LE Strength taining/ROM;Community reintegration;Functional electrical stimulation;Patient/family education;Stair  training;UE/LE Coordination activities ?PT Transfers Anticipated Outcome(s): ModI ?PT Locomotion Anticipated Outcome(s): ModI ?PT Recommendation ?Recommendations for Other Services: Therapeutic Recreation consult ?Therapeutic Recreation Interventions: Stress management;Pet therapy ?Follow Up Recommendations: Outpatient PT (cardiac rehab?) ?Patient destination: Home ?Equipment Recommended: To be determined ? ? ?PT Evaluation ?Precautions/Restrictions ?Precautions ?Precautions: Fall ?Precaution Comments: BP ?Restrictions ?Weight Bearing Restrictions: No ?General ?  Vital SignsTherapy Vitals ?BP: (!) 139/51 (teds applied after) ?Patient Position (if appropriate): Sitting ?Pain ?Pain Assessment ?Pain Scale: 0-10 ?Pain Score: 0-No pain ?Pain Interference ?Pain Interference ?Pain Effect on Sleep: 0. Does not apply - I have not had any pain or hurting in the past 5 days ?Pain Interference with Therapy Activities: 0. Does not apply - I have not received rehabilitationtherapy in the past 5 days ?Pain Interference with Day-to-Day Activities: 1. Rarely or not at all ?Home Living/Prior Functioning ?Home Living ?Living Arrangements: Alone ?Available Help at Discharge: Family;Available 24 hours/day ?Type of Home: House ?Home Access: Stairs to enter ?Entrance Stairs-Number of Steps: 4 ?Entrance Stairs-Rails: Left ?Home Layout: One level ?Bathroom Shower/Tub: Walk-in shower;Curtain ?Bathroom Toilet: Standard ?Bathroom Accessibility: Yes ?Additional Comments: pt plans to have assist from brother's wife. Pt's wife passed June 2022 ? Lives With: Alone ?Prior Function ?Level of Independence: Independent with basic ADLs;Independent with homemaking with ambulation;Independent with transfers;Independent with gait ? Able to Take Stairs?: Yes ?Driving: Yes ?Vocation: Part time employment ?Vocation Requirements: building transmissions for [employer] ?Vision/Perception  ?Vision - History ?Ability to See in Adequate Light: 0  Adequate ?Perception ?Perception: Within Functional Limits ?Praxis ?Praxis: Intact  ?Cognition ?Overall Cognitive Status: Within Functional Limits for tasks assessed ?Arousal/Alertness: Awake/alert ?Orientation Level: Ori

## 2022-03-09 NOTE — Evaluation (Signed)
Occupational Therapy Assessment and Plan ? ?Patient Details  ?Name: Jesus [name] ?MRN: [id number] ?Date of Birth: [birth year] ? ?OT Diagnosis: abnormal posture and muscle weakness (generalized) ?Rehab Potential: Rehab Potential (ACUTE ONLY): Good ?ELOS: 7-10  ? ?Today's Date: 03/09/2022 ?OT Individual Time: [phone] ?OT Individual Time Calculation (min): 74 min    ? ?Hospital Problem: Principal Problem: ?  Debility ? ? ?Past Medical History:  ?Past Medical History:  ?Diagnosis Date  ? Acute myocardial infarction of inferior wall ([place]) 1980  ? Aortic stenosis   ? mild with a mean aortic valve gradient of 12 mmHg  ? Atrial fibrillation ([place])   ? holding sinus rhythm on Amiodarone  ? CAD (coronary artery disease)   ? a. S/P Ant MI 1980;  b. 1997 S/P CABG x 8 (LIMA to diag-LAD, SVG to OM1-OM2-OM3, SVG to [facility] - Dr [name]);  c. 01/2015 Cath: 3VD, 8/8 patent grafts.  ? Cardiomyopathy   ? Dysrhythmia   ? Esophageal reflux   ? GERD (gastroesophageal reflux disease)   ? Headache   ? History of colonoscopy   ? History of transesophageal echocardiography (TEE) for monitoring   ? Other and unspecified hyperlipidemia   ? Paroxysmal atrial fibrillation ([place]) 02/20/2022  ? S/P angioplasty with stent 02/17/22 DES to proximal VG to PDA 02/20/2022  ? S/P TAVR (transcatheter aortic valve replacement)   ? a. 03/2015 26 mm Edwards Sapien 3 transcatheter heart valve placed via open left transfemoral approach.  ? Severe aortic stenosis 08/10/2012  ? Skin lesions, generalized   ? facial which may represent actinic keratoses and possible photosensitivity from Amiodarone  ? Unspecified essential hypertension   ? ?Past Surgical History:  ?Past Surgical History:  ?Procedure Laterality Date  ? CARDIAC CATHETERIZATION    ? CARDIOVERSION  11/18/2006  ? Dr. [name]  ? CATARACT EXTRACTION W/ INTRAOCULAR LENS IMPLANT  April '13  (Dr. [name])  ? left eye only  ? CHOLECYSTECTOMY    ? CORONARY ARTERY BYPASS GRAFT  02/09/1996  ? LIMA to diag-LAD, SVG  to OM1-OM2-OM3, SVG to [facility]  ? CORONARY STENT INTERVENTION N/A 02/17/2022  ? Procedure: CORONARY STENT INTERVENTION;  Surgeon: Burnell Blanks, MD;  Location: [place] [facility];  Service: Cardiovascular;  Laterality: N/A;  ? CORONARY/GRAFT ANGIOGRAPHY N/A 02/17/2022  ? Procedure: CORONARY/GRAFT ANGIOGRAPHY;  Surgeon: Burnell Blanks, MD;  Location: [place] CV LAB;  Service: Cardiovascular;  Laterality: N/A;  ? EYE SURGERY    ? LEFT AND RIGHT HEART CATHETERIZATION WITH CORONARY/GRAFT ANGIOGRAM N/A 01/26/2015  ? Procedure: LEFT AND RIGHT HEART CATHETERIZATION WITH [name];  Surgeon: [name], MD;  Location: [facility] CATH LAB;  Service: Cardiovascular;  Laterality: N/A;  ? TEE WITHOUT CARDIOVERSION N/A 03/10/2015  ? Procedure: TRANSESOPHAGEAL ECHOCARDIOGRAM (TEE);  Surgeon: [name], MD;  Location: [place];  Service: Open Heart Surgery;  Laterality: N/A;  ? TONSILLECTOMY    ? TRANSCATHETER AORTIC VALVE REPLACEMENT, TRANSFEMORAL N/A 03/10/2015  ? Procedure: TRANSCATHETER AORTIC VALVE REPLACEMENT, TRANSFEMORAL;  Surgeon: [name], MD;  Location: [place];  Service: Open Heart Surgery;  Laterality: N/A;  ? ? ?Assessment & Plan ?Clinical Impression:    ? The pt is an 81 yo male presenting 3/26 with SOB. Pt recently here for bilateral LE edema on 3/23 and DVT was ruled out so he was d/c home on 3/24. This admission pt found to have pulmonary edema and bilateral pleural effusions. PMH includes: CAD s/p CABG, NSTEMI 02/20/22 s/p cardiac cath with stent placement, ischemic  cardiomyopathy, afib on coumadin, aortic stenosis s/p TAVR 2016, HLD, and HTN.  ? ? ?Patient currently requires min with basic self-care skills secondary to muscle weakness, decreased cardiorespiratoy endurance, and decreased standing balance, decreased postural control, and decreased balance strategies.  Prior to hospitalization, patient could complete BADL/IADL/vocation with independent . ? ?Patient will  benefit from skilled intervention to increase independence with basic self-care skills and increase level of independence with iADL prior to discharge home independently.  Anticipate patient will require intermittent supervision and follow up home health. ? ?OT - End of Session ?Endurance Deficit: Yes ?OT Assessment ?Rehab Potential (ACUTE ONLY): Good ?OT Barriers to Discharge: Decreased caregiver support;Lack of/limited family support ?OT Patient demonstrates impairments in the following area(s): Balance;Endurance;Safety;Motor ?OT Basic ADL's Functional Problem(s): Grooming;Bathing;Dressing;Toileting ?OT Advanced ADL's Functional Problem(s): Simple Meal Preparation;Laundry ?OT Transfers Functional Problem(s): Toilet;Tub/Shower ?OT Additional Impairment(s): Fuctional Use of Upper Extremity ?OT Plan ?OT Intensity: Minimum of 1-2 x/day, 45 to 90 minutes ?OT Frequency: 5 out of 7 days ?OT Duration/Estimated Length of Stay: 7-10 ?OT Treatment/Interventions: Environmental education officer education;Therapeutic Activities;Psychosocial support;Cognitive remediation/compensation;Community reintegration;Functional mobility training;Self Care/advanced ADL retraining;UE/LE Strength taining/ROM;Discharge planning;Neuromuscular re-education;UE/LE Coordination activities;Splinting/orthotics;Pain management;Disease mangement/prevention ?OT Self Feeding Anticipated Outcome(s): MOD I ?OT Basic Self-Care Anticipated Outcome(s): MOD I ?OT Toileting Anticipated Outcome(s): MOD I ?OT Bathroom Transfers Anticipated Outcome(s): MOD I ?OT Recommendation ?Patient destination: Home ?Follow Up Recommendations: Home health OT ?Equipment Recommended: To be determined;Tub/shower seat ? ? ?OT Evaluation ?Precautions/Restrictions  ?Precautions ?Precautions: Fall ?Precaution Comments: BP ?Restrictions ?Weight Bearing Restrictions: No ?General ?Chart Reviewed: Yes ?Family/Caregiver Present: No ?Vital  Signs ?Therapy Vitals ?Temp: 98.3 ?F (36.8 ?C) ?Pulse Rate: 61 ?Resp: 14 ?BP: (!) 139/51 (teds applied after) ?Patient Position (if appropriate): Sitting ?Oxygen Therapy ?SpO2: 95 % ?O2 Device: Room Air ?Pain ?Pain Assessment ?Pain Score: 0-No pain ?Home Living/Prior Functioning ?Home Living ?Available Help at Discharge: Family, Available 24 hours/day ?Type of Home: House ?Home Access: Stairs to enter ?Entrance Stairs-Number of Steps: 4 ?Entrance Stairs-Rails: Left ?Home Layout: One level ?Bathroom Shower/Tub: Gaffer, Curtain ?Bathroom Toilet: Standard ?Additional Comments: pt plans to have assist from brother's wife. Pt's wife passed June 2022 ?IADL History ?Homemaking Responsibilities: Yes ?Meal Prep Responsibility: Primary (microwave) ?Laundry Responsibility: Primary ?Cleaning Responsibility: No ?Bill Paying/Finance Responsibility: Primary ?Shopping Responsibility: Primary ?Child Care Responsibility: No ?Current License: Yes ?Occupation: Part time employment ?Type of Occupation: transmission building ?Prior Function ?Level of Independence: Independent with basic ADLs, Independent with homemaking with ambulation ? Able to Take Stairs?: Yes ?Vision ?Baseline Vision/History: 1 Wears glasses ?Ability to See in Adequate Light: 0 Adequate ?Patient Visual Report: No change from baseline ?Vision Assessment?: No apparent visual deficits ?Perception  ?Perception: Within Functional Limits ?Praxis ?Praxis: Intact ?Cognition ?Cognition ?Overall Cognitive Status: Within Functional Limits for tasks assessed ?Arousal/Alertness: Awake/alert ?Orientation Level: Person;Place;Situation ?Person: Oriented ?Place: Oriented ?Situation: Oriented ?Attention: Selective ?Selective Attention: Appears intact ?Awareness: Appears intact ?Problem Solving: Appears intact ?Safety/Judgment: Appears intact ?Brief Interview for Mental Status (BIMS) ?Repetition of Three Words (First Attempt): 3 ?Temporal Orientation: Year: Correct ?Temporal  Orientation: Month: Accurate within 5 days ?Temporal Orientation: Day: Correct ?Recall: "Sock": No, could not recall ?Recall: "Blue": No, could not recall ?Recall: "Bed": Yes, after cueing ("a piece of furnitu

## 2022-03-09 NOTE — Progress Notes (Signed)
Inpatient Rehabilitation Care Coordinator ?Assessment and Plan ?Patient Details  ?Name: Jesus [name] ?MRN: [id number] ?Date of Birth: 06-02-1941 ? ?Today's Date: 03/09/2022 ? ?Hospital Problems: Principal Problem: ?  Debility ? ?Past Medical History:  ?Past Medical History:  ?Diagnosis Date  ? Acute myocardial infarction of inferior wall ([place]) 1980  ? Aortic stenosis   ? mild with a mean aortic valve gradient of 12 mmHg  ? Atrial fibrillation ([place])   ? holding sinus rhythm on Amiodarone  ? CAD (coronary artery disease)   ? a. S/P Ant MI 1980;  b. 1997 S/P CABG x 8 (LIMA to diag-LAD, SVG to OM1-OM2-OM3, SVG to [facility] - Dr [name]);  c. 01/2015 Cath: 3VD, 8/8 patent grafts.  ? Cardiomyopathy   ? Dysrhythmia   ? Esophageal reflux   ? GERD (gastroesophageal reflux disease)   ? Headache   ? History of colonoscopy   ? History of transesophageal echocardiography (TEE) for monitoring   ? Other and unspecified hyperlipidemia   ? Paroxysmal atrial fibrillation ([place]) 02/20/2022  ? S/P angioplasty with stent 02/17/22 DES to proximal VG to PDA 02/20/2022  ? S/P TAVR (transcatheter aortic valve replacement)   ? a. 03/2015 26 mm Edwards Sapien 3 transcatheter heart valve placed via open left transfemoral approach.  ? Severe aortic stenosis 08/10/2012  ? Skin lesions, generalized   ? facial which may represent actinic keratoses and possible photosensitivity from Amiodarone  ? Unspecified essential hypertension   ? ?Past Surgical History:  ?Past Surgical History:  ?Procedure Laterality Date  ? CARDIAC CATHETERIZATION    ? CARDIOVERSION  11/18/2006  ? Dr. [name]  ? CATARACT EXTRACTION W/ INTRAOCULAR LENS IMPLANT  April '13  (Dr. [name])  ? left eye only  ? CHOLECYSTECTOMY    ? CORONARY ARTERY BYPASS GRAFT  02/09/1996  ? LIMA to diag-LAD, SVG to OM1-OM2-OM3, [facility]G to [facility]  ? CORONARY STENT INTERVENTION N/A 02/17/2022  ? Procedure: CORONARY STENT INTERVENTION;  Surgeon: Burnell Blanks, MD;  Location: [place] CV LAB;   Service: Cardiovascular;  Laterality: N/A;  ? CORONARY/GRAFT ANGIOGRAPHY N/A 02/17/2022  ? Procedure: CORONARY/GRAFT ANGIOGRAPHY;  Surgeon: Burnell Blanks, MD;  Location: [place] CV LAB;  Service: Cardiovascular;  Laterality: N/A;  ? EYE SURGERY    ? LEFT AND RIGHT HEART CATHETERIZATION WITH CORONARY/GRAFT ANGIOGRAM N/A 01/26/2015  ? Procedure: LEFT AND RIGHT HEART CATHETERIZATION WITH [name];  Surgeon: [name], MD;  Location: [facility] CATH LAB;  Service: Cardiovascular;  Laterality: N/A;  ? TEE WITHOUT CARDIOVERSION N/A 03/10/2015  ? Procedure: TRANSESOPHAGEAL ECHOCARDIOGRAM (TEE);  Surgeon: [name], MD;  Location: [place];  Service: Open Heart Surgery;  Laterality: N/A;  ? TONSILLECTOMY    ? TRANSCATHETER AORTIC VALVE REPLACEMENT, TRANSFEMORAL N/A 03/10/2015  ? Procedure: TRANSCATHETER AORTIC VALVE REPLACEMENT, TRANSFEMORAL;  Surgeon: [name], MD;  Location: [place];  Service: Open Heart Surgery;  Laterality: N/A;  ? ?Social History:  reports that he has never smoked. He has never used smokeless tobacco. He reports that he does not drink alcohol and does not use drugs. ? ?Family / Support Systems ?Marital Status: Widow/Widower ?How Long?: 05/2021 ?Patient Roles: Other (Comment) (brother in-law and uncle) ?Other Supports: Fayrene Fearing [phone]  Rhonda-niece [phone] ?Anticipated Caregiver: Self family to check on him only ?Ability/Limitations of Caregiver: intermittent assist sister has health issues and nieces work ?Caregiver Availability: Intermittent ?Family Dynamics: Close with family pt is very independent and was still working part time. He hopes to go back to  and wants to be at a level he can be home alone at [place] ? 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 ?Abuse/Neglect ?Abuse/Neglect Assessment Can Be Completed: Yes ?Physical Abuse: Denies ?Verbal Abuse: Denies ?Sexual Abuse: Denies ?Exploitation of patient/patient's resources: Denies ?Self-Neglect: Denies ? ?Patient response to: ?Social Isolation - How often do you feel lonely or isolated from those around you?: Never ? ?Emotional Status ?Pt's affect, behavior and adjustment status: Pt is motivated to do well and recover from this event. He feels he is doing well already and only plans to be here a short time. He is high level and doing well. MD will need to talk with regarding driving and working ?Recent Psychosocial Issues: other health issues ?Psychiatric History: No history or issues he is coping appropriately and optimistic regarding his recovery from this heart event. Will be short length of stay due to high level ?Substance Abuse History: No issues ? ?Patient / Family Perceptions, Expectations & Goals ?Pt/Family understanding of illness & functional limitations: Pt is able to explain his heart issues and reason for hospitalization. He does talk with the MD daily and feels his questions and concerns are beng addressed. ?Premorbid pt/family roles/activities: employee, uncle, brother, friend, etc ?Anticipated changes in roles/activities/participation: resume ?Pt/family expectations/goals: Pt states: " I hope to be independent and go back to work soon." ? ?Community Resources ?Community Agencies: None ?Premorbid Home Care/DME Agencies: None ?Transportation available at discharge: Self family can assist until he is able again ?Is the patient able to respond to  transportation needs?: Yes ?In the past 12 months, has lack of transportation kept you from medical appointments or from getting medications?: No ?In the past 12 months, has lack of transportation kept you from meetings, work, or from getting things needed for daily living?: No ? 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 Working on balance issues but is high levle and will be a short length of stay ?Care Coordinator Barriers to Discharge: Insurance for SNF coverage, Decreased caregiver support ?Care Coordinator Anticipated Follow Up Needs: HH/OP ? ?Clinical Impression ?Pleasant gentleman who is very active and still works part time. He feels this keeps him going. His family is supportive but can not provide 24/7 care. Will await therapy team evaluations and work on discharge needs. ? ?Elease Hashimoto ?03/09/2022, 9:48 AM ? ?  ?

## 2022-03-09 NOTE — Progress Notes (Signed)
ANTICOAGULATION CONSULT NOTE - Follow Up Consult ? ?Pharmacy Consult for Warfarin ?Indication: TAVR / PAF ? ?Allergies  ?Allergen Reactions  ? Amlodipine Other (See Comments)  ?  Swelling?  ? Codeine Other (See Comments)  ?  Pt does not remember  ? Iodine Swelling  ? ? ?Patient Measurements: ?Height: '5\' 1"'$  (154.9 cm) ?Weight: 55.3 kg (122 lb) ?IBW/kg (Calculated) : 52.3 ? ?Vital Signs: ?Temp: 98.3 ?F (36.8 ?C) (04/05 0516) ?Temp Source: Oral (04/04 2016) ?BP: 132/53 (04/05 0516) ?Pulse Rate: 61 (04/05 0516) ? ?Labs: ?Recent Labs  ?  03/07/22 ?0175 03/08/22 ?0041 03/09/22 ?0505  ?HGB  --   --  11.2*  ?HCT  --   --  33.8*  ?PLT  --   --  229  ?LABPROT 23.0* 25.0* 24.5*  ?INR 2.0* 2.3* 2.2*  ?CREATININE 1.69* 1.36* 1.40*  ? ? ? ?Estimated Creatinine Clearance: 29.6 mL/min (A) (by C-G formula based on SCr of 1.4 mg/dL (H)). ? ? ?Assessment: ?On warfarin PTA for Afib, 4 mg daily PTA ?INR therapeutic at 2.2 today. Last CBC stable (4/5) ?Warfarin held 3/28 and 3/29 to allow INR to drift down. ?No bleed issues reported ?Noted on amiodarone (PTA dose) ? ?Goal of Therapy:  ?INR 2-3 ?Monitor platelets by anticoagulation protocol: Yes ?  ?Plan:  ?Warfarin '4mg'$  daily ?Monitor daily INR, CBC, s/sx bleeding ? ? ?Catelynn Sparger Anastasovites BS ?Pharmacy Student ?03/09/2022 7:51 AM ? ? ? ?  ? ?

## 2022-03-10 LAB — PROTIME-INR
INR: 2.6 — ABNORMAL HIGH (ref 0.8–1.2)
Prothrombin Time: 27.2 seconds — ABNORMAL HIGH (ref 11.4–15.2)

## 2022-03-10 MED ORDER — WARFARIN SODIUM 3 MG PO TABS
3.0000 mg | ORAL_TABLET | Freq: Once | ORAL | Status: AC
  Administered 2022-03-10: 3 mg via ORAL
  Filled 2022-03-10: qty 1

## 2022-03-10 NOTE — Progress Notes (Signed)
?                                                       PROGRESS NOTE ? ? ?Subjective/Complaints: ? ?LBM yesterday. Needs to brush teeth- something stuck in upper teeth.  ?Slept GREAT- first time since admission to hospital.  ?LBM yesterday ?Denies any pain.  ? ? ?ROS: ? ?Pt denies SOB, abd pain, CP, N/V/C/D, and vision changes ? ? ?Objective: ?  ?No results found. ?Recent Labs  ?  03/09/22 ?0505  ?WBC 10.0  ?HGB 11.2*  ?HCT 33.8*  ?PLT 229  ? ?Recent Labs  ?  03/08/22 ?0041 03/09/22 ?0505  ?NA 139 135  ?K 4.4 4.3  ?CL 108 104  ?CO2 23 24  ?GLUCOSE 104* 96  ?BUN 31* 28*  ?CREATININE 1.36* 1.40*  ?CALCIUM 8.7* 8.9  ? ? ?Intake/Output Summary (Last 24 hours) at 03/10/2022 0835 ?Last data filed at 03/09/2022 1900 ?Gross per 24 hour  ?Intake 262 ml  ?Output --  ?Net 262 ml  ?  ? ?  ? ?Physical Exam: ?Vital Signs ?Blood pressure (!) 122/57, pulse 62, temperature 97.9 ?F (36.6 ?C), resp. rate 16, height '5\' 1"'$  (1.549 m), weight 55.3 kg, SpO2 95 %. ? ? ? ? ?General: awake, alert, appropriate, supine in bed; bright affect, NAD ?HENT: conjugate gaze; oropharynx moist ?CV: regular rate; no JVD ?Pulmonary: CTA B/L; no W/R/R- good air movement ?GI: soft, NT, ND, (+)BS ?Psychiatric: appropriate= bright affect ?Neurological: alert ?Extremities; no LE edema ?Musculoskeletal:     ?   General: No swelling or tenderness. Normal range of motion.  ?   Cervical back: Normal range of motion and neck supple.  ?Skin: ?   General: Skin is warm and dry.  ?   Findings: No rash.  ?Neurological:  ?   Mental Status: He is alert and oriented to person, place, and time.  ?   Cranial Nerves: No cranial nerve deficit.  ?   Sensory: No sensory deficit.  ?   Comments: Reasonable insight and awareness. Functional memory. Normal language and speech. UE 4+/5 prox to distal. LE: 3/5 HF, 4-/5 KE and 4/5 ADF/PF. Normal reflexes. No abnl tone. Normal cerebellar exam ? ?Assessment/Plan: ?1. Functional deficits which require 3+ hours per day of interdisciplinary  therapy in a comprehensive inpatient rehab setting. ?Physiatrist is providing close team supervision and 24 hour management of active medical problems listed below. ?Physiatrist and rehab team continue to assess barriers to discharge/monitor patient progress toward functional and medical goals ? ?Care Tool: ? ?Bathing ? Bathing activity did not occur: Safety/medical concerns ?Body parts bathed by patient: Right arm, Left arm, Chest, Abdomen, Front perineal area, Buttocks, Right upper leg, Left upper leg, Right lower leg, Left lower leg, Face  ?   ?  ?  ?Bathing assist Assist Level: Contact Guard/Touching assist ?  ?  ?Upper Body Dressing/Undressing ?Upper body dressing   ?What is the patient wearing?: [place] only ?   ?Upper body assist Assist Level: Minimal Assistance - Patient > 75% ?   ?Lower Body Dressing/Undressing ?Lower body dressing ? ? ? Lower body dressing activity did not occur: Safety/medical concerns ?What is the patient wearing?: Pants ? ?  ? ?Lower body assist Assist for lower body dressing: Contact Guard/Touching assist ?   ? ?Toileting ?Toileting    ?  Toileting assist Assist for toileting: Independent with assistive device ?Assistive Device Comment: walker ?  ?Transfers ?Chair/bed transfer ? ?Transfers assist ?   ? ?Chair/bed transfer assist level: Independent with assistive device ?Chair/bed transfer assistive device: Walker ?  ?Locomotion ?Ambulation ? ? ?Ambulation assist ? ?   ? ?Assist level: Contact Guard/Touching assist ?Assistive device: Walker-rolling ?Max distance: 300  ? ?Walk 10 feet activity ? ? ?Assist ?   ? ?Assist level: Contact Guard/Touching assist ?Assistive device: Walker-rolling  ? ?Walk 50 feet activity ? ? ?Assist   ? ?Assist level: Contact Guard/Touching assist ?Assistive device: Walker-rolling  ? ? ?Walk 150 feet activity ? ? ?Assist   ? ?Assist level: Contact Guard/Touching assist ?Assistive device: Walker-rolling ?  ? ?Walk 10 feet on uneven surface   ?activity ? ? ?Assist   ? ? ?Assist level: Contact Guard/Touching assist ?   ? ?Wheelchair ? ? ? ? ?Assist Is the patient using a wheelchair?: No ?  ?  ? ?  ?   ? ? ?Wheelchair 50 feet with 2 turns activity ? ? ? ?Assist ? ?  ?  ? ? ?   ? ?Wheelchair 150 feet activity  ? ? ? ?Assist ?   ? ? ?   ? ?Blood pressure (!) 122/57, pulse 62, temperature 97.9 ?F (36.6 ?C), resp. rate 16, height '5\' 1"'$  (1.549 m), weight 55.3 kg, SpO2 95 %. ? ?Medical Problem List and Plan: ?1. Functional deficits secondary to debility after systolic congestive heart failure and other medical complications ?            -patient may shower ?            -ELOS/Goals: 5-7 days, Mod I for PT , OT ? Con't CIR- PT and OT ?2.  Antithrombotics: ?-DVT/anticoagulation:  Pharmaceutical: Coumadin-INR therapeutic.  ? 4/6- INR 2.6- con't per pharmacy ?            -antiplatelet therapy: N/A ?3. Pain Management:  Tylenol prn.  ?4. Mood: LCSW to follow for evaluation and support.  ?            -antipsychotic agents: N/A ?5. Neuropsych: This patient is capable of making decisions on his own behalf. ?6. Skin/Wound Care: Routine pressure relief measures. ?7. Fluids/Electrolytes/Nutrition: Strict I/O. Check CMET in am  ?8. 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   ?            --Wt improved from 132 lbs-->122 (which is average overall) ?            --on Jardiance, Lasix, metoprolol, aldactone and Plavix. No ARB due to CKD ?            -daily weights ? 4/6- no weight- will remind nursing.  ?9: Orthostatic hypotension: Will continue to monitor orthostatic vitals. ?--TEDs for edema control and support.  ?4/5- not out of Bed yet, but will monitor for Sx's.  ?10. 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 Continue metoprolol and amiodarone.  ?            --coumadin per pharmacy ?12. AKI: BUN/SCr 29/1.57 at admission likely due to recent PTCA/dyte ?--Was WNL prior to cath 03/16 (past three months per Epic.) ?--SCr  stabilizing to 1.3-1.4 range.  ?4/5- Cr 1.4-- in normal range for pt- con't to monitor ? 4/6- Will recheck in AM ?13. GERD: Continue protonix ?14. Iron deficiency anemia: Has been supplemented with Ferric gluconate 250  mg X 2 doses ?--monitor H/H with serial checks. ?4/6- CBC again in AM   ?  ?  ?  ? ?LOS: ?2 days ?A FACE TO FACE EVALUATION WAS PERFORMED ? ?Calder Oblinger ?03/10/2022, 8:35 AM  ? ? ? ?

## 2022-03-10 NOTE — Progress Notes (Signed)
Physical Therapy Session Note ? ?Patient Details  ?Name: Jesus [name] ?MRN: [id number] ?Date of Birth: 07-12-1941 ? ?Today's Date: 03/10/2022 ?PT Individual Time: 7847-8412 ?PT Individual Time Calculation (min): 68 min  ? ?Short Term Goals: ?Week 1:  PT Short Term Goal 1 (Week 1): STG= LTG based on ELOS ? ? ?Skilled Therapeutic Interventions/Progress Updates:  ? ?Pt received sitting in WC and agreeable to PT. Pt performed ambulatory transfer with RW and supervision assist from PT for safety to [facility].  ? ?Pt transported to entrance of [place]. Gait training with and without AD over unlevel cement sidewalk x 224f, 1750fx 2, 10073f and [id number] for safety with intermittent lateral LOB to the R when fatigued without AD. No LOB with use of RW.  ? ?Dynamic balance training to complete medium and low diffiiculty block puzzle while standing on airex pad. No UE support and supervision assist from PT to maintain balance.  ? ?Pt requesting to use restroom. Ambulatory transfer to toilet without AD. Able to complete urination and perform all pericare without assist from PT.  ? ?Patient returned to room and performed stand pivot to recliner with RW. Pt left sitting in recliner with call bell in reach and all needs met.   ?  ? ? ?   ? ?Therapy Documentation ?Precautions:  ?Precautions ?Precautions: Fall ?Precaution Comments: BP ?Restrictions ?Weight Bearing Restrictions: No ? ?Vital Signs: ?Therapy Vitals ?Temp: 98.1 ?F (36.7 ?C) ?Pulse Rate: 61 ?Resp: 20 ?BP: (!) 102/52 ?Patient Position (if appropriate): Sitting ?Oxygen Therapy ?SpO2: 99 % ?O2 Device: Room Air ?Pain: ?Pain Assessment ?Pain Scale: 0-10 ?Pain Score: 0-No pain ? ? ? ?Therapy/Group: Individual Therapy ? ?AustLorie Phenix6/2023, 3:33 PM  ?

## 2022-03-10 NOTE — Progress Notes (Addendum)
ANTICOAGULATION CONSULT NOTE - Follow Up Consult ? ?Pharmacy Consult for Warfarin ?Indication: TAVR / PAF ? ?Allergies  ?Allergen Reactions  ? Amlodipine Other (See Comments)  ?  Swelling?  ? Codeine Other (See Comments)  ?  Pt does not remember  ? Iodine Swelling  ? ? ?Patient Measurements: ?Height: '5\' 1"'$  (154.9 cm) ?Weight: 55.3 kg (122 lb) ?IBW/kg (Calculated) : 52.3 ? ?Vital Signs: ?Temp: 97.9 ?F (36.6 ?C) (04/06 0439) ?BP: 122/57 (04/06 0439) ?Pulse Rate: 61 (04/06 0439) ? ?Labs: ?Recent Labs  ?  03/08/22 ?0041 03/09/22 ?0505 03/10/22 ?7673  ?HGB  --  11.2*  --   ?HCT  --  33.8*  --   ?PLT  --  229  --   ?LABPROT 25.0* 24.5* 27.2*  ?INR 2.3* 2.2* 2.6*  ?CREATININE 1.36* 1.40*  --   ? ? ? ?Estimated Creatinine Clearance: 29.6 mL/min (A) (by C-G formula based on SCr of 1.4 mg/dL (H)). ? ? ?Assessment: ?On warfarin PTA for Afib, 4 mg daily PTA ?INR therapeutic at 2.6 today. Last CBC stable (4/5) ?Warfarin held 3/28 and 3/29 to allow INR to drift down. ?No bleed issues reported ?Noted on amiodarone (PTA dose) ? ?Goal of Therapy:  ?INR 2-3 ?Monitor platelets by anticoagulation protocol: Yes ?  ?Plan:  ?Warfarin 3 mg ?Monitor daily INR, CBC, s/sx bleeding ? ? ?Dani Anastasovites BS ?Pharmacy Student ?03/10/2022 7:27 AM ? ?I agree with the contents of this note and the plan as outlined above. ? ?Mily Malecki BS, PharmD, BCPS ?Clinical Pharmacist ?03/10/2022 9:02 AM ? ? ? ?  ? ?

## 2022-03-10 NOTE — Progress Notes (Signed)
Occupational Therapy Session Note ? ?Patient Details  ?Name: Jesus [name] ?MRN: [id number] ?Date of Birth: 07/19/1941 ? ?Today's Date: 03/10/2022 ?OT Group Time: 1101-1200 ?OT Group Time Calculation (min): 59 min ? ? ?Short Term Goals: ?Week 1:  OT Short Term Goal 1 (Week 1): STG=LTG d/t ELOS ? ?Skilled Therapeutic Interventions/Progress Updates:  ?Pt participate in group session with a focus on therapeutic activity of completing obstacles course to simulate community mobility as well as engaging in seated BUE therex to  increase UB strength and endurance to improve overall independence with ADLs/ functional mobility. First, pt completed obstacle course with pt instructed to step over obstacles, step up onto curb, walk across uneven surfaces and maneuver RW around cones. Pt completed course with CGA with RW.  In between pts turns pt also completed seated therex as indicated below, with pt completing each therex for 1 min and then resting for 30 secs. Therex included:  ?Bicep curls ?Flys ?Seated Kicks ?Seated marches ?Upright rows ?Chest presses ?OH presses ?Arm circles ?Discussed fall recovery/prevention. Discussed steps for fall recovery such as making sure you have your cellphone close by and calling for help first. Then, if you feel like you can get up discussed steps for how to get up. Visual demo provided of prone>quadraped and then using sturdy object to come up onto one knee then turning to sit into chair. Education provided on knowing the local fire dept number to assist with helping pts up if they were to have a fall at home. Education also provided on fall prevention such as removing obstacles in home such as excess clutter, remove throw rugs, allow adequate lighting, manage pets/kids when ambulating. Additionally discussed tips for community mobility such as using energy conservation strategies effectively. Pt transported back to room by RT.  ? ?Therapy Documentation ?Precautions:   ?Precautions ?Precautions: Fall ?Precaution Comments: BP ?Restrictions ?Weight Bearing Restrictions: No ? ?Pain: no pain reported during session  ? ?Therapy/Group: Group Therapy ? ?Precious Haws ?03/10/2022, 12:53 PM ?

## 2022-03-10 NOTE — Progress Notes (Signed)
Occupational Therapy Session Note ? ?Patient Details  ?Name: Jesus [name] ?MRN: [id number] ?Date of Birth: [birth year] ? ?Today's Date: 03/10/2022 ?OT Individual Time: 0900-1000 ?OT Individual Time Calculation (min): 60 min  ? ? ?Short Term Goals: ?Week 1:  OT Short Term Goal 1 (Week 1): STG=LTG d/t ELOS ?Week 2:    ? ?Skilled Therapeutic Interventions/Progress Updates:  ?   ? ?Therapy Documentation ?Precautions:  ?Precautions ?Precautions: Fall ?Precaution Comments: BP ?Restrictions ?Weight Bearing Restrictions: No ?The pt was seen this AM and was in good spirits upon arrival indicated that he would like to take a shower.  The pt was able to go from supine in bed to EOB with CGA.  He was able to go from EOB to standing using a RW for balance and CGA for safety.  The pt was able to demonstrate functional mobility to commode with CGA and he demonstrated the same LOF for transitioning to the shower bench for showering.  The pt was able to wash his UB/LB with s/u, incorporating the grab bars for steadying himself while seated for additional reach.  The pt was able to come from sit to stand with CGA using the grab bars for washing his bottom,he required MinA with this area secondary to functional reach and cleaning himself properly.  The pt was able to apply deodorant and lotion with s/u assist.  The pt was able to donn his UB/LB with s/u assist  and returned to his living quarters to brush his teeth and hair with s/u assist.  The pt ambulated to the recliner at the close of the session using his RW and CGA for safety.  The patient was able to transfer from standing to sitting in the recliner with his call light and bedside table nearby.  The pt's alarm was activated and all additional needs were addressed, the pt had no c/o pain this treatment session.  ? ?Therapy/Group: Individual Therapy ? ?Yvonne Kendall ?03/10/2022, 12:06 PM ?

## 2022-03-11 ENCOUNTER — Ambulatory Visit: Payer: Medicare Other

## 2022-03-11 LAB — PROTIME-INR
INR: 2.4 — ABNORMAL HIGH (ref 0.8–1.2)
Prothrombin Time: 26.2 seconds — ABNORMAL HIGH (ref 11.4–15.2)

## 2022-03-11 LAB — BASIC METABOLIC PANEL
Anion gap: 9 (ref 5–15)
BUN: 34 mg/dL — ABNORMAL HIGH (ref 8–23)
CO2: 24 mmol/L (ref 22–32)
Calcium: 9 mg/dL (ref 8.9–10.3)
Chloride: 104 mmol/L (ref 98–111)
Creatinine, Ser: 1.6 mg/dL — ABNORMAL HIGH (ref 0.61–1.24)
GFR, Estimated: 42 mL/min — ABNORMAL LOW (ref >60)
Glucose, Bld: 91 mg/dL (ref 70–99)
Potassium: 4.4 mmol/L (ref 3.5–5.1)
Sodium: 137 mmol/L (ref 135–145)

## 2022-03-11 LAB — CBC
HCT: 33.8 % — ABNORMAL LOW (ref 39.0–52.0)
Hemoglobin: 11.4 g/dL — ABNORMAL LOW (ref 13.0–17.0)
MCH: 33.1 pg (ref 26.0–34.0)
MCHC: 33.7 g/dL (ref 30.0–36.0)
MCV: 98.3 fL (ref 80.0–100.0)
Platelets: 223 10*3/uL (ref 150–400)
RBC: 3.44 MIL/uL — ABNORMAL LOW (ref 4.22–5.81)
RDW: 12.9 % (ref 11.5–15.5)
WBC: 8.8 10*3/uL (ref 4.0–10.5)
nRBC: 0 % (ref 0.0–0.2)

## 2022-03-11 MED ORDER — LIDOCAINE 5 % EX PTCH
1.0000 | MEDICATED_PATCH | CUTANEOUS | Status: DC
  Administered 2022-03-11 – 2022-03-14 (×2): 1 via TRANSDERMAL
  Filled 2022-03-11 (×4): qty 1

## 2022-03-11 MED ORDER — WARFARIN SODIUM 4 MG PO TABS
4.0000 mg | ORAL_TABLET | Freq: Every day | ORAL | Status: AC
  Administered 2022-03-11: 4 mg via ORAL
  Filled 2022-03-11: qty 1

## 2022-03-11 MED ORDER — SORBITOL 70 % SOLN
30.0000 mL | Freq: Once | Status: AC
  Administered 2022-03-11: 30 mL via ORAL
  Filled 2022-03-11: qty 30

## 2022-03-11 NOTE — IPOC Note (Signed)
Overall Plan of Care (IPOC) ?Patient Details ?Name: Jesus [name] ?MRN: [id number] ?DOB: [birth year] ? ?Admitting Diagnosis: Debility ? ?Hospital Problems: Principal Problem: ?  Debility ? ? ? ? Functional Problem List: ?Nursing Medication Management, Safety, Endurance  ?PT Balance, Endurance, Motor, Nutrition, Pain, Safety, Sensory, Skin Integrity  ?OT Balance, Endurance, Safety, Motor  ?SLP    ?TR    ?    ? Basic ADL?s: ?OT Grooming, Bathing, Dressing, Toileting  ? ?  Advanced  ADL?s: ?OT Simple Meal Preparation, Laundry  ?   ?Transfers: ?PT Bed Mobility, Bed to Chair, Car, Furniture  ?OT Toilet, Tub/Shower  ? ?  Locomotion: ?PT Ambulation, Stairs  ? ?  Additional Impairments: ?OT Fuctional Use of Upper Extremity  ?SLP   ?  ?   ?TR    ? ? ?Anticipated Outcomes ?Item Anticipated Outcome  ?Self Feeding MOD I  ?Swallowing ?   ?  ?Basic self-care ? MOD I  ?Toileting ? MOD I ?  ?Bathroom Transfers MOD I  ?Bowel/Bladder ? n/a  ?Transfers ? ModI  ?Locomotion ? ModI  ?Communication ?    ?Cognition ?    ?Pain ? n/a  ?Safety/Judgment ? maintain safety w cues  ? ?Therapy Plan: ?PT Intensity: Minimum of 1-2 x/day ,45 to 90 minutes ?PT Frequency: 5 out of 7 days ?PT Duration Estimated Length of Stay: 5-7 days ?OT Intensity: Minimum of 1-2 x/day, 45 to 90 minutes ?OT Frequency: 5 out of 7 days ?OT Duration/Estimated Length of Stay: 7-10 ?   ? ?Due to the current state of emergency, patients may not be receiving their 3-hours of Medicare-mandated therapy. ? ? Team Interventions: ?Nursing Interventions Disease Management/Prevention, Medication Management, Discharge Planning, Patient/Family Education  ?PT interventions Ambulation/gait training, Discharge planning, Functional mobility training, Psychosocial support, Therapeutic Activities, Visual/perceptual remediation/compensation, Balance/vestibular training, Disease management/prevention, Neuromuscular re-education, Skin care/wound management, Therapeutic Exercise, Cognitive  remediation/compensation, DME/adaptive equipment instruction, Pain management, Splinting/orthotics, UE/LE Strength taining/ROM, Community reintegration, Functional electrical stimulation, Patient/family education, Stair training, UE/LE Coordination activities  ?OT Interventions Training and development officer, DME/adaptive equipment instruction, Patient/family education, Therapeutic Activities, Psychosocial support, Cognitive remediation/compensation, Community reintegration, Functional mobility training, Self Care/advanced ADL retraining, UE/LE Strength taining/ROM, Discharge planning, Neuromuscular re-education, UE/LE Coordination activities, Splinting/orthotics, Pain management, Disease mangement/prevention  ?SLP Interventions    ?TR Interventions    ?SW/CM Interventions Discharge Planning, Psychosocial Support, Patient/Family Education  ? ?Barriers to Discharge ?MD  Medical stability, Home enviroment access/loayout, IV antibiotics, Lack of/limited family support, and Weight  ?Nursing Lack of/limited family support, Home environment access/layout ?1 level 4 ste bil rails solo; family to assist prn. Working PTA; living independently  ?PT Decreased caregiver support, Lack of/limited family support ?   ?OT Decreased caregiver support, Lack of/limited family support ?   ?SLP   ?   ?SW Insurance for SNF coverage, Decreased caregiver support ?   ? ?Team Discharge Planning: ?Destination: PT-Home ,OT- Home , SLP-  ?Projected Follow-up: PT-Outpatient PT (cardiac rehab?), OT-  Home health OT, SLP-  ?Projected Equipment Needs: PT-To be determined, OT- To be determined, Tub/shower seat, SLP-  ?Equipment Details: PT- , OT-  ?Patient/family involved in discharge planning: PT- Patient,  OT-Patient, SLP-  ? ?MD ELOS: 7-10 days ?Medical Rehab Prognosis:  Good ?Assessment: The patient has been admitted for CIR therapies with the diagnosis of debility secondary to CHF. The team will be addressing functional mobility, strength, stamina,  balance, safety, adaptive techniques and equipment, self-care, bowel and bladder mgt, patient and caregiver education, fluid education. Goals have been  set at mod I. Anticipated discharge destination is home. ? ?Due to the current state of emergency, patients may not be receiving their 3 hours per day of Medicare-mandated therapy.  ? ? ? ? ? ?See Team Conference Notes for weekly updates to the plan of care ? ?

## 2022-03-11 NOTE — Progress Notes (Signed)
ANTICOAGULATION CONSULT NOTE - Follow Up Consult ? ?Pharmacy Consult for Warfarin ?Indication: TAVR / PAF ? ?Allergies  ?Allergen Reactions  ? Amlodipine Other (See Comments)  ?  Swelling?  ? Codeine Other (See Comments)  ?  Pt does not remember  ? Iodine Swelling  ? ? ?Patient Measurements: ?Height: '5\' 1"'$  (154.9 cm) ?Weight: 54.9 kg (121 lb 0.5 oz) ?IBW/kg (Calculated) : 52.3 ? ?Vital Signs: ?Temp: 98.8 ?F (37.1 ?C) (04/07 0445) ?BP: 130/52 (04/07 0445) ?Pulse Rate: 65 (04/07 0445) ? ?Labs: ?Recent Labs  ?  03/09/22 ?0505 03/10/22 ?6599 03/11/22 ?0506  ?HGB 11.2*  --  11.4*  ?HCT 33.8*  --  33.8*  ?PLT 229  --  223  ?LABPROT 24.5* 27.2* 26.2*  ?INR 2.2* 2.6* 2.4*  ?CREATININE 1.40*  --  1.60*  ? ? ? ?Estimated Creatinine Clearance: 25.9 mL/min (A) (by C-G formula based on SCr of 1.6 mg/dL (H)). ? ? ?Assessment: ?On warfarin PTA for Afib, 4 mg daily PTA ?INR therapeutic at 2.4 today. Last CBC stable (4/7) ?Warfarin held 3/28 and 3/29 to allow INR to drift down. ?No bleed issues reported ?Noted on amiodarone (PTA dose) ? ?Goal of Therapy:  ?INR 2-3 ?Monitor platelets by anticoagulation protocol: Yes ?  ?Plan:  ?Warfarin 4 mg ?Monitor daily INR, CBC, s/sx bleeding ? ?Ahmad Vanwey Anastasovites BS ?Pharmacy Student ?03/11/2022 8:10 AM ? ? ? ?  ? ? ? ?  ? ?

## 2022-03-11 NOTE — Progress Notes (Addendum)
?                                                       PROGRESS NOTE ? ? ?Subjective/Complaints: ? ?Cr up to 1.60- will push fluids- and recheck Monday.  ? ?Neck hurting- got hot packs- helps a little . ?Got tylenol at5am- not real helpful.  ?Breathing better.  ?LBM- tiny- wants something to go better.  ? ? ?ROS: ? ?Pt denies SOB, abd pain, CP, N/V/(+) C/D, and vision changes ? ? ?Objective: ?  ?No results found. ?Recent Labs  ?  03/09/22 ?0505 03/11/22 ?0506  ?WBC 10.0 8.8  ?HGB 11.2* 11.4*  ?HCT 33.8* 33.8*  ?PLT 229 223  ? ?Recent Labs  ?  03/09/22 ?0505 03/11/22 ?0506  ?NA 135 137  ?K 4.3 4.4  ?CL 104 104  ?CO2 24 24  ?GLUCOSE 96 91  ?BUN 28* 34*  ?CREATININE 1.40* 1.60*  ?CALCIUM 8.9 9.0  ? ? ?Intake/Output Summary (Last 24 hours) at 03/11/2022 0905 ?Last data filed at 03/11/2022 0802 ?Gross per 24 hour  ?Intake 370 ml  ?Output --  ?Net 370 ml  ?  ? ?  ? ?Physical Exam: ?Vital Signs ?Blood pressure 128/73, pulse (!) 53, temperature 98.8 ?F (37.1 ?C), resp. rate 18, height '5\' 1"'$  (1.549 m), weight 54.9 kg, SpO2 98 %. ? ? ? ? ? ?General: awake, alert, appropriate, sitting up in bedside chair; nursing in room; NAD ?HENT: conjugate gaze; oropharynx dry ?CV: bradycardic rate; no JVD ?Pulmonary: CTA B/L; no W/R/R- good air movement ?GI: soft, NT, ND, (+)BS ?Psychiatric: appropriate ?Neurological: Ox3 ? ?Musculoskeletal: L neck muscles tight- L upper trap; levator, scalenes- TTP    ?   General: No swelling or tenderness. Normal range of motion.  ?   Cervical back: Normal range of motion and neck supple.  ?Skin: ?   General: Skin is warm and dry.  ?   Findings: No rash.  ?Neurological:  ?   Mental Status: He is alert and oriented to person, place, and time.  ?   Cranial Nerves: No cranial nerve deficit.  ?   Sensory: No sensory deficit.  ?   Comments: Reasonable insight and awareness. Functional memory. Normal language and speech. UE 4+/5 prox to distal. LE: 3/5 HF, 4-/5 KE and 4/5 ADF/PF. Normal reflexes. No abnl tone.  Normal cerebellar exam ? ?Assessment/Plan: ?1. Functional deficits which require 3+ hours per day of interdisciplinary therapy in a comprehensive inpatient rehab setting. ?Physiatrist is providing close team supervision and 24 hour management of active medical problems listed below. ?Physiatrist and rehab team continue to assess barriers to discharge/monitor patient progress toward functional and medical goals ? ?Care Tool: ? ?Bathing ? Bathing activity did not occur: Safety/medical concerns ?Body parts bathed by patient: Right arm, Left arm, Chest, Abdomen, Front perineal area, Buttocks, Right upper leg, Left upper leg, Right lower leg, Left lower leg, Face  ?   ?  ?  ?Bathing assist Assist Level: Contact Guard/Touching assist ?  ?  ?Upper Body Dressing/Undressing ?Upper body dressing   ?What is the patient wearing?: [place] only ?   ?Upper body assist Assist Level: Minimal Assistance - Patient > 75% ?   ?Lower Body Dressing/Undressing ?Lower body dressing ? ? ? Lower body dressing activity did not occur: Safety/medical concerns ?What  is the patient wearing?: Pants ? ?  ? ?Lower body assist Assist for lower body dressing: Contact Guard/Touching assist ?   ? ?Toileting ?Toileting    ?Toileting assist Assist for toileting: Independent with assistive device ?Assistive Device Comment: walker ?  ?Transfers ?Chair/bed transfer ? ?Transfers assist ?   ? ?Chair/bed transfer assist level: Independent with assistive device ?Chair/bed transfer assistive device: Walker ?  ?Locomotion ?Ambulation ? ? ?Ambulation assist ? ?   ? ?Assist level: Contact Guard/Touching assist ?Assistive device: Walker-rolling ?Max distance: 300  ? ?Walk 10 feet activity ? ? ?Assist ?   ? ?Assist level: Contact Guard/Touching assist ?Assistive device: Walker-rolling  ? ?Walk 50 feet activity ? ? ?Assist   ? ?Assist level: Contact Guard/Touching assist ?Assistive device: Walker-rolling  ? ? ?Walk 150 feet activity ? ? ?Assist   ? ?Assist level:  Contact Guard/Touching assist ?Assistive device: Walker-rolling ?  ? ?Walk 10 feet on uneven surface  ?activity ? ? ?Assist   ? ? ?Assist level: Contact Guard/Touching assist ?   ? ?Wheelchair ? ? ? ? ?Assist Is the patient using a wheelchair?: No ?  ?  ? ?  ?   ? ? ?Wheelchair 50 feet with 2 turns activity ? ? ? ?Assist ? ?  ?  ? ? ?   ? ?Wheelchair 150 feet activity  ? ? ? ?Assist ?   ? ? ?   ? ?Blood pressure 128/73, pulse (!) 53, temperature 98.8 ?F (37.1 ?C), resp. rate 18, height '5\' 1"'$  (1.549 m), weight 54.9 kg, SpO2 98 %. ? ?Medical Problem List and Plan: ?1. Functional deficits secondary to debility after systolic congestive heart failure and other medical complications ?            -patient may shower ?            -ELOS/Goals: 5-7 days, Mod I for PT , OT ? Continue CIR- PT, OT  ?2.  Antithrombotics: ?-DVT/anticoagulation:  Pharmaceutical: Coumadin-INR therapeutic.  ? 4/6- INR 2.6- con't per pharmacy ?            -antiplatelet therapy: N/A ?3. Pain Management:  Tylenol prn. ?4/7- L neck tightness/pain- will try lidocaine patch 8pm to 8am- no heat pack at night- just during day when patch is off- ?  ?4. Mood: LCSW to follow for evaluation and support.  ?            -antipsychotic agents: N/A ?5. Neuropsych: This patient is capable of making decisions on his own behalf. ?6. Skin/Wound Care: Routine pressure relief measures. ?7. Fluids/Electrolytes/Nutrition: Strict I/O. Check CMET in am  ?8. 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   ?            --Wt improved from 132 lbs-->122 (which is average overall) ?            --on Jardiance, Lasix, metoprolol, aldactone and Plavix. No ARB due to CKD ?            -daily weights ? 4/6- no weight- will remind nursing.  ? 4/7- weight down to 54.9 kg- down 0.6 mg again- likely due to being dry- will push fluids ?9: Orthostatic hypotension: Will continue to monitor orthostatic vitals. ?--TEDs for edema control and support.  ?4/5- not out  of Bed yet, but will monitor for Sx's. ?4/7- doing better- con't TEDs  ?10. 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 Continue metoprolol and  amiodarone.  ?            --coumadin per pharmacy ?12. AKI: BUN/SCr 29/1.57 at admission likely due to recent PTCA/dyte ?--Was WNL prior to cath 03/16 (past three months per Epic.) ?--SCr stabilizing to 1.3-1.4 range.  ?4/5- Cr 1.4-- in normal range for pt- con't to monitor ?4/7- Cr up to 1.60 and BUN 34- will recheck Monday and give IVFs- push fluids over weekend ?13. GERD: Continue protonix ?14. Iron deficiency anemia: Has been supplemented with Ferric gluconate 250 mg X 2 doses ?--monitor H/H with serial checks. ?4/6- CBC again in AM   ? 4/7- Hb 11.4- doing better- trending up ?15. Constipation ? 4/7- will give sorbitol to get him to have BM- clean out ? ?I spent a total of 35   minutes on total care today- >50% coordination of care- due to IPOC, and d/w nursing about pain- no heat when wearing Lidocaine patch-  ? ?  ?  ?  ? ?LOS: ?3 days ?A FACE TO FACE EVALUATION WAS PERFORMED ? ?Irmalee Riemenschneider ?03/11/2022, 9:05 AM  ? ? ? ?

## 2022-03-11 NOTE — Progress Notes (Signed)
Occupational Therapy Session Note ? ?Patient Details  ?Name: Jesus [name] ?MRN: [id number] ?Date of Birth: August 18, 1941 ? ?Today's Date: 03/11/2022 ?OT Individual Time: 0701-0800 ?OT Individual Time Calculation (min): 59 min  ? ? ?Short Term Goals: ?Week 1:  OT Short Term Goal 1 (Week 1): STG=LTG d/t ELOS ? ?Skilled Therapeutic Interventions/Progress Updates:  ?Patient met lying supine in bed at conclusion of breakfast meal in agreement with OT treatment session. Reports soreness in L cervical neck with use of heat pack for pain management prior to this writers entry. Patient reports preference to bathe daily. Supine to EOB with S. Sit to stand from EOB and ambulatory toilet transfer with RW and CGA. 3/3 parts of toileting task and walk-in shower transfer with CGA. UB bathing/dressing with set-up and LB bathing/dressing with CGA with at least unilateral UE support. Max A to don TED hose but patient able to don shoes with set-up A. Oral hygiene standing at sink level with CGA and UE support on sink surface. Cues for walker management throughout session. Patient reports use of electric razor at home. Requested assist with shaving using straight razor. Total A for completion of task per patient request. Session concluded with patient seated in recliner with call bell within reach, belt alarm activated and all needs met.  ? ?Therapy Documentation ?Precautions:  ?Precautions ?Precautions: Fall ?Precaution Comments: BP ?Restrictions ?Weight Bearing Restrictions: No ?General: ?  ?Therapy/Group: Individual Therapy ? ?Jolea Dolle R [place]-[name] ?03/11/2022, 6:46 AM ?

## 2022-03-11 NOTE — Progress Notes (Signed)
Physical Therapy Session Note ? ?Patient Details  ?Name: Jesus [name] ?MRN: [id number] ?Date of Birth: 22-Apr-1941 ? ?Today's Date: 03/11/2022 ?PT Individual Time: 5465-0354 ?PT Individual Time Calculation (min): 55 min  ? ?Short Term Goals: ?Week 1:  PT Short Term Goal 1 (Week 1): STG= LTG based on ELOS ? ?Skilled Therapeutic Interventions/Progress Updates:  ?  Patient received sitting up in recliner, agreeable to PT. He denies pain. Ambulating to therapy gym with CGA, no AD. PT proving patient with lighter weight SPC. He was able to [facility] 278[facility]. Patient performing the following therex: LAQ, marches, abd, add, [place] Patient ambulating 2062fwith SPC and 2# ankle weights + CGA. Patient completing lateral stepping, forward/backward walking with UE support on hallway handrail. Supervision provided, no LOB. Patient completing limits of stability activity on Biodex and weight shifting playing game of catch on Biodex. Difficulty with posterior weight shift and lateral weight shifting to limit of stability. Patient ambulating back to his room with no AD and supervision. Mild lateral LOB when presented with an obstacle, but able to recover independently. Patient requesting to use bathroom. Supervision for clothing management in standing- handoff to NT to continue care.  ? ?Therapy Documentation ?Precautions:  ?Precautions ?Precautions: Fall ?Precaution Comments: BP ?Restrictions ?Weight Bearing Restrictions: No ? ? ? ? ?Therapy/Group: Individual Therapy ? ?JeDebbora DusJeDebbora DusPT, DPT, CBIS ? ?03/11/2022, 7:44 AM  ?

## 2022-03-11 NOTE — Progress Notes (Signed)
Physical Therapy Session Note ? ?Patient Details  ?Name: Jesus [name] ?MRN: [id number] ?Date of Birth: [birth year] ? ?Today's Date: 03/11/2022 ?PT Individual Time: 8841-6606 ?PT Individual Time Calculation (min): 39 min  ? ?Short Term Goals: ?Week 1:  PT Short Term Goal 1 (Week 1): STG= LTG based on ELOS ? ?Skilled Therapeutic Interventions/Progress Updates:  ? Received pt sitting in dayroom - handoff from OT, OT requesting PT's opinion on gait and recommended DME. Pt agreeable to PT treatment and denied any pain during session. Session with emphasis on functional mobility/transfers, generalized strengthening and endurance, dynamic standing balance, gait training, and toileting. Pt transferred sit<>stand without AD and CGA/supervision and ambulated 14f without AD and CGA with mild unsteadiness and narrow BOS when turning. Pt then performed seated BUE/LE strengthening on Nustep at workload 4 for 8 minutes for a total of 386 steps with emphasis on cardiovascular endurance and reciprocal movement training. Discussed trying SPC (pt reports having quad cane at home). Provided pt with heavy SPC (light aluminum one not available) and pt ambulated additional 1818fwith SPC and CGA/supervision demonstrating improvements in balance/stability as well as gait speed. Pt then performed TUG initally starting with SPC then transitioning without AD with average 13.3 seconds - pt educated on test results and significance that he is borderline fall risk and would likely benefit from use of SPC - notified primary PT ?Trial 1: 14 seconds - with SPC ?Trial 2: 14 seconds - without AD ?Trial 3: 12 seconds - without AD ?Pt ambulated additional 15061fith SPC and CGA back to room (but requesting to carry cane due to heaviness). Pt reported urge to void and ambulated in/out of bathroom without AD and CGA. Pt able to manage clothing and void with supervision (but reported constipation - RN notified) - stood at sink and washed hands with  supervision. Concluded session with pt sitting in recliner, needs within reach, and seatbelt alarm on.  ? ?Therapy Documentation ?Precautions:  ?Precautions ?Precautions: Fall ?Precaution Comments: BP ?Restrictions ?Weight Bearing Restrictions: No ? ?Therapy/Group: Individual Therapy ?AnnBlenda NicelynnBecky Sax, DPT  ?03/11/2022, 7:20 AM  ?

## 2022-03-11 NOTE — Progress Notes (Signed)
Occupational Therapy Session Note ? ?Patient Details  ?Name: Jesus [name] ?MRN: [id number] ?Date of Birth: 13-Oct-1941 ? ?Today's Date: 03/11/2022 ?OT Individual Time: 7371-0626 ?OT Individual Time Calculation (min): 45 min  ? ? ?Short Term Goals: ?Week 1:  OT Short Term Goal 1 (Week 1): STG=LTG d/t ELOS ? ?Skilled Therapeutic Interventions/Progress Updates:  ?  Pt received in recliner ready for therapy.  Pt stood up to RW and ambulated down hallway with RW with supervision to walk to day room.   ?Because pt walked well with RW, had him trial walking short distance without and he felt his was easier for him as he did not have to manage walker.  Then trialed 150 ft to walk a loop around the unit.  For safety, I had my hand on gait belt but pt had no LOB.  He does tend to look down so cued to look forward.  Occasionally he had decreased foot clearance but no tripping.   Pt worked on a set of arm exercises then repeated the walk. After 150 ft he states he has 5/10 fatigue level. O2 94%, HR 59.   ? ?Arm exercises of 20 reps, 4 sets using a 5lb dowel with yellow theraband providing extra resistance: ?-bicep curls ?-rows ?-sh flex to 90 ? ?10 x 3 sets sit to stand holding 5 lb dowel bar from arm chair with close supervision.  Standing and lifting bar to shoulder height.  ? ?Excellent participation in therapy.  ?Hand off to PT for his next session. ? ?Therapy Documentation ?Precautions:  ?Precautions ?Precautions: Fall ?Precaution Comments: BP ?Restrictions ?Weight Bearing Restrictions: No ? ?Vital Signs: ?Therapy Vitals ?Pulse Rate: (!) 53 ?BP: 128/73 ?Pain: ?Pain Assessment ?Pain Score: 0-No pain ? ? ?Therapy/Group: Individual Therapy ? ?Kinde ?03/11/2022, 10:46 AM ?

## 2022-03-12 LAB — PROTIME-INR
INR: 2.6 — ABNORMAL HIGH (ref 0.8–1.2)
Prothrombin Time: 27.3 s — ABNORMAL HIGH (ref 11.4–15.2)

## 2022-03-12 MED ORDER — WARFARIN SODIUM 4 MG PO TABS
4.0000 mg | ORAL_TABLET | Freq: Every day | ORAL | Status: AC
  Administered 2022-03-12: 4 mg via ORAL
  Filled 2022-03-12: qty 1

## 2022-03-12 MED ORDER — PANTOPRAZOLE 2 MG/ML SUSPENSION
40.0000 mg | Freq: Every day | ORAL | Status: DC
  Administered 2022-03-12 – 2022-03-14 (×3): 40 mg
  Filled 2022-03-12 (×2): qty 20

## 2022-03-12 MED ORDER — METOPROLOL TARTRATE 12.5 MG HALF TABLET
12.5000 mg | ORAL_TABLET | Freq: Two times a day (BID) | ORAL | Status: DC
  Administered 2022-03-12 – 2022-03-13 (×2): 12.5 mg via ORAL
  Filled 2022-03-12 (×5): qty 1

## 2022-03-12 NOTE — Plan of Care (Signed)
?  Problem: Consults ?Goal: RH GENERAL PATIENT EDUCATION ?Description: See Patient Education module for education specifics. ?Outcome: Progressing ?  ?Problem: RH SAFETY ?Goal: RH STG ADHERE TO SAFETY PRECAUTIONS W/ASSISTANCE/DEVICE ?Description: STG Adhere to Safety Precautions With cues Assistance/Device. ?Outcome: Progressing - pt continues to improve. ?  ?Problem: RH KNOWLEDGE DEFICIT GENERAL ?Goal: RH STG INCREASE KNOWLEDGE OF SELF CARE AFTER HOSPITALIZATION ?Description: Patient and family will be able to manage care at discharge using medications and dietary modifications using handouts and educational resources independently ?Outcome: Progressing -  ?  ?Problem: Education: ?Goal: Ability to demonstrate management of disease process will improve ?Description: Patient and family will be able to manage care at discharge using medications and dietary modifications using handouts and educational resources independently ?Outcome: Progressing ?  ?Problem: Education: ?Goal: Ability to verbalize understanding of medication therapies will improve ?Outcome: Progressing ?  ?Problem: Education: ?Goal: Individualized Educational Video(s) ?Outcome: Progressing ?  ?

## 2022-03-12 NOTE — Progress Notes (Signed)
ANTICOAGULATION CONSULT NOTE - Follow Up Consult ? ?Pharmacy Consult for Warfarin ?Indication: TAVR / PAF ? ?Allergies  ?Allergen Reactions  ? Amlodipine Other (See Comments)  ?  Swelling?  ? Codeine Other (See Comments)  ?  Pt does not remember  ? Iodine Swelling  ? ? ?Patient Measurements: ?Height: '5\' 1"'$  (154.9 cm) ?Weight: 54.7 kg (120 lb 9.5 oz) ?IBW/kg (Calculated) : 52.3 ? ?Vital Signs: ?Temp: 98 ?F (36.7 ?C) (04/08 0431) ?BP: 129/57 (04/08 0431) ?Pulse Rate: 55 (04/08 0431) ? ?Labs: ?Recent Labs  ?  03/10/22 ?0347 03/11/22 ?4259 03/12/22 ?5638  ?HGB  --  11.4*  --   ?HCT  --  33.8*  --   ?PLT  --  223  --   ?LABPROT 27.2* 26.2* 27.3*  ?INR 2.6* 2.4* 2.6*  ?CREATININE  --  1.60*  --   ? ? ? ?Estimated Creatinine Clearance: 25.9 mL/min (A) (by C-G formula based on SCr of 1.6 mg/dL (H)). ? ? ?Assessment: ?On warfarin PTA for Afib, 4 mg daily PTA ?INR therapeutic at 2.6 today. Last CBC stable (4/7) ?No bleed issues reported ?Noted on amiodarone (PTA dose) ? ?Goal of Therapy:  ?INR 2-3 ?Monitor platelets by anticoagulation protocol: Yes ?  ?Plan:  ?Warfarin 4 mg x1 ?Monitor daily INR, CBC, s/sx bleeding ? ? ?Joseph Art, Pharm.D. ?PGY-1 Pharmacy Resident ?[phone] ?03/12/2022 7:29 AM ? ?

## 2022-03-12 NOTE — Progress Notes (Signed)
?                                                       PROGRESS NOTE ? ? ?Subjective/Complaints: ?His family asks about his HR- reviewed trend with them. Discussed that I changed lopressor to 12.'5mg'$  BID since we cannot crush long-acting. Discussed that if HR continues to be <60 will change to HS dosing ? ? ?ROS: ? ?Pt denies SOB, abd pain, CP, N/V/(+) C/D, and vision changes ? ? ?Objective: ?  ?No results found. ?Recent Labs  ?  03/11/22 ?0506  ?WBC 8.8  ?HGB 11.4*  ?HCT 33.8*  ?PLT 223  ? ?Recent Labs  ?  03/11/22 ?0506  ?NA 137  ?K 4.4  ?CL 104  ?CO2 24  ?GLUCOSE 91  ?BUN 34*  ?CREATININE 1.60*  ?CALCIUM 9.0  ? ? ?Intake/Output Summary (Last 24 hours) at 03/12/2022 1344 ?Last data filed at 03/12/2022 1311 ?Gross per 24 hour  ?Intake 420 ml  ?Output --  ?Net 420 ml  ?  ? ?  ? ?Physical Exam: ?Vital Signs ?Blood pressure (!) 112/45, pulse 62, temperature 97.8 ?F (36.6 ?C), temperature source Oral, resp. rate 16, height '5\' 1"'$  (1.549 m), weight 54.7 kg, SpO2 98 %. ?Gen: no distress, normal appearing ?HEENT: oral mucosa pink and moist, NCAT ?Cardio: Reg rate ?Chest: normal effort, normal rate of breathing ?Abd: soft, non-distended ?Ext: no edema ?Psych: pleasant, normal affect ? ?Musculoskeletal: L neck muscles tight- L upper trap; levator, scalenes- TTP    ?   General: No swelling or tenderness. Normal range of motion.  ?   Cervical back: Normal range of motion and neck supple.  ?Skin: ?   General: Skin is warm and dry.  ?   Findings: No rash.  ?Neurological:  ?   Mental Status: He is alert and oriented to person, place, and time.  ?   Cranial Nerves: No cranial nerve deficit.  ?   Sensory: No sensory deficit.  ?   Comments: Reasonable insight and awareness. Functional memory. Normal language and speech. UE 4+/5 prox to distal. LE: 3/5 HF, 4-/5 KE and 4/5 ADF/PF. Normal reflexes. No abnl tone. Normal cerebellar exam ? ?Assessment/Plan: ?1. Functional deficits which require 3+ hours per day of interdisciplinary therapy  in a comprehensive inpatient rehab setting. ?Physiatrist is providing close team supervision and 24 hour management of active medical problems listed below. ?Physiatrist and rehab team continue to assess barriers to discharge/monitor patient progress toward functional and medical goals ? ?Care Tool: ? ?Bathing ? Bathing activity did not occur: Safety/medical concerns ?Body parts bathed by patient: Right arm, Left arm, Chest, Abdomen, Front perineal area, Buttocks, Right upper leg, Left upper leg, Right lower leg, Left lower leg, Face  ?   ?  ?  ?Bathing assist Assist Level: Contact Guard/Touching assist ?  ?  ?Upper Body Dressing/Undressing ?Upper body dressing   ?What is the patient wearing?: [place] only ?   ?Upper body assist Assist Level: Minimal Assistance - Patient > 75% ?   ?Lower Body Dressing/Undressing ?Lower body dressing ? ? ? Lower body dressing activity did not occur: Safety/medical concerns ?What is the patient wearing?: Pants ? ?  ? ?Lower body assist Assist for lower body dressing: Contact Guard/Touching assist ?   ? ?Toileting ?Toileting    ?Toileting  assist Assist for toileting: Independent with assistive device ?Assistive Device Comment: walker ?  ?Transfers ?Chair/bed transfer ? ?Transfers assist ?   ? ?Chair/bed transfer assist level: Contact Guard/Touching assist ?Chair/bed transfer assistive device: Other (none) ?  ?Locomotion ?Ambulation ? ? ?Ambulation assist ? ?   ? ?Assist level: Contact Guard/Touching assist ?Assistive device: Cane-straight ?Max distance: 173f  ? ?Walk 10 feet activity ? ? ?Assist ?   ? ?Assist level: Contact Guard/Touching assist ?Assistive device: Cane-straight  ? ?Walk 50 feet activity ? ? ?Assist   ? ?Assist level: Contact Guard/Touching assist ?Assistive device: Cane-straight  ? ? ?Walk 150 feet activity ? ? ?Assist   ? ?Assist level: Contact Guard/Touching assist ?Assistive device: Cane-straight ?  ? ?Walk 10 feet on uneven surface  ?activity ? ? ?Assist    ? ? ?Assist level: Contact Guard/Touching assist ?   ? ?Wheelchair ? ? ? ? ?Assist Is the patient using a wheelchair?: No ?  ?  ? ?  ?   ? ? ?Wheelchair 50 feet with 2 turns activity ? ? ? ?Assist ? ?  ?  ? ? ?   ? ?Wheelchair 150 feet activity  ? ? ? ?Assist ?   ? ? ?   ? ?Blood pressure (!) 112/45, pulse 62, temperature 97.8 ?F (36.6 ?C), temperature source Oral, resp. rate 16, height '5\' 1"'$  (1.549 m), weight 54.7 kg, SpO2 98 %. ? ?Medical Problem List and Plan: ?1. Functional deficits secondary to debility after systolic congestive heart failure and other medical complications ?            -patient may shower ?            -ELOS/Goals: 5-7 days, Mod I for PT , OT ? Continue CIR- PT, OT  ?2.  Antithrombotics: ?-DVT/anticoagulation:  Pharmaceutical: Coumadin-INR therapeutic at 2.6, '4mg'$  warfarin today.  ?            -antiplatelet therapy: N/A ?3. L neck tightness/pain- continue lidocaine patch 8pm to 8am- no heat pack at night- just during day when patch is off- ?  ?4. Mood: LCSW to follow for evaluation and support.  ?            -antipsychotic agents: N/A ?5. Neuropsych: This patient is capable of making decisions on his own behalf. ?6. Skin/Wound Care: Routine pressure relief measures. ?7. Fluids/Electrolytes/Nutrition: Strict I/O. Check CMET in am  ?8. Acute on chronic systolic HF w/ EF 384-66%  ?--Monitor for signs of overload.   ?            --Wt improved from 132 lbs-->122 (which is average overall) ?            --on Jardiance, Lasix, metoprolol, aldactone and Plavix. No ARB due to CKD ?            -daily weights ? 4/6- no weight- will remind nursing.  ? 4/7- weight down to 54.9 kg- down 0.6 mg again- likely due to being dry- will push fluids ?9: Orthostatic hypotension: Will continue to monitor orthostatic vitals. ?--TEDs for edema control and support.  ?4/5- not out of Bed yet, but will monitor for Sx's. ?4/7- doing better- con't TEDs  ?10. 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  Continue metoprolol and amiodarone. Change lopressor to 12.'5mg'$  BID so canbe crushed.  ?            --coumadin per pharmacy ?12. AKI: BUN/SCr 29/1.57 at admission likely due to recent PTCA/dyte ?--  Was WNL prior to cath 03/16 (past three months per Epic.) ?--SCr stabilizing to 1.3-1.4 range.  ?4/5- Cr 1.4-- in normal range for pt- con't to monitor ?4/7- Cr up to 1.60 and BUN 34- will recheck Monday and give IVFs- push fluids over weekend ?13. GERD: Continue protonix ?14. Iron deficiency anemia: Has been supplemented with Ferric gluconate 250 mg X 2 doses ?--monitor H/H with serial checks. ?4/6- CBC again in AM   ? 4/7- Hb 11.4- doing better- trending up ?15. Constipation ? 4/7- will give sorbitol to get him to have BM- clean out ? ?  ? ?LOS: ?4 days ?A FACE TO FACE EVALUATION WAS PERFORMED ? ?Martha Clan P [employer] Pham ?03/12/2022, 1:44 PM  ? ? ? ?

## 2022-03-13 LAB — PROTIME-INR
INR: 2.5 — ABNORMAL HIGH (ref 0.8–1.2)
Prothrombin Time: 26.5 seconds — ABNORMAL HIGH (ref 11.4–15.2)

## 2022-03-13 MED ORDER — WARFARIN SODIUM 4 MG PO TABS
4.0000 mg | ORAL_TABLET | Freq: Every day | ORAL | Status: AC
  Administered 2022-03-13: 4 mg via ORAL
  Filled 2022-03-13: qty 1

## 2022-03-13 NOTE — Progress Notes (Signed)
ANTICOAGULATION CONSULT NOTE - Follow Up Consult ? ?Pharmacy Consult for Warfarin ?Indication: TAVR / PAF ? ?Allergies  ?Allergen Reactions  ? Amlodipine Other (See Comments)  ?  Swelling?  ? Codeine Other (See Comments)  ?  Pt does not remember  ? Iodine Swelling  ? ? ?Patient Measurements: ?Height: '5\' 1"'$  (154.9 cm) ?Weight: 54.7 kg (120 lb 9.5 oz) ?IBW/kg (Calculated) : 52.3 ? ?Vital Signs: ?Temp: 97.9 ?F (36.6 ?C) (04/09 0424) ?Temp Source: Oral (04/09 0424) ?BP: 134/71 (04/09 0424) ?Pulse Rate: 60 (04/09 0424) ? ?Labs: ?Recent Labs  ?  03/11/22 ?0506 03/12/22 ?1093  ?HGB 11.4*  --   ?HCT 33.8*  --   ?PLT 223  --   ?LABPROT 26.2* 27.3*  ?INR 2.4* 2.6*  ?CREATININE 1.60*  --   ? ? ? ?Estimated Creatinine Clearance: 25.9 mL/min (A) (by C-G formula based on SCr of 1.6 mg/dL (H)). ? ? ?Assessment: ?On warfarin PTA for Afib, 4 mg daily PTA ?INR therapeutic at 2.5 today. Last CBC stable (4/7) ?No bleed issues reported ?Noted on amiodarone (PTA dose) ? ?Goal of Therapy:  ?INR 2-3 ?Monitor platelets by anticoagulation protocol: Yes ?  ?Plan:  ?Warfarin 4 mg x1 ?Monitor daily INR and s/sx bleeding ? ? ?Joseph Art, Pharm.D. ?PGY-1 Pharmacy Resident ?ATFTD:[phone] ?03/13/2022 7:21 AM ? ?

## 2022-03-13 NOTE — Progress Notes (Signed)
Occupational Therapy Session Note ? ?Patient Details  ?Name: Jesus [name] ?MRN: [id number] ?Date of Birth: 08-01-1941 ? ?Today's Date: 03/13/2022 ?OT Individual Time: 3491-7915 ?OT Individual Time Calculation (min): 68 min  ? ? ?Short Term Goals: ?Week 1:  OT Short Term Goal 1 (Week 1): STG=LTG d/t ELOS ? ?Skilled Therapeutic Interventions/Progress Updates:  ?Pt greeted up in room with nurse present providing meds, pt up at sink shaving. pt agreeable to OT intervention.  ?Session focus on BADL reeducation, functional mobility, dynamic standing balance and decreasing overall caregiver burden.          ?Pt completed dressing from EOB with set- up assist. Pt donned shoes with set- up. Pt completed functional ambulation from room to dayroom with no AD and CGA.  ?Pt engaged in standing dynamic balance task using Wii game system to facilitate improved dynamic balance, coordination and motor planning. Graded task up and had pt stand on airex cushion to further challenge balance, pt needed CGA for balance with mild posterior lean but good righting reaction noted.  Pt needed MOD verbal/tactile cues to for motor planning and problem solving correct set- up of hand placement on controller.  ?Next, pt completed IADL task of gathering easter eggs from around gym to simulate dynamic balance task of picking up clothes at home. Eggs were placed at knee level and below with pt able to retrieve all eggs with no AD, CGA and no LOB.   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  ?Next, pt worked on IADL task of engaging in laundry activity. Pt able to ambulate around gym to collect towels and carry laundry basket to laundry room with CGA. Pt placed towels in washer/ dryer with supervision, pt also able to carry  laundry basket back to room and stood to fold towels.   Pt reports she has a lady come to do the heavy cleaning at home.        pt left seated in recliner with chair alarm activated and all needs within reach.                 ? ? ?Therapy Documentation ?Precautions:  ?Precautions ?Precautions: Fall ?Precaution Comments: BP ?Restrictions ?Weight Bearing Restrictions: No ? ?Pain: no pain reported during session  ? ?Therapy/Group: Individual Therapy ? ?Precious Haws ?03/13/2022, 10:17 AM ?

## 2022-03-13 NOTE — Plan of Care (Signed)
?  Problem: Consults ?Goal: RH GENERAL PATIENT EDUCATION ?Description: See Patient Education module for education specifics. ?Outcome: Progressing ?  ?Problem: RH KNOWLEDGE DEFICIT GENERAL ?Goal: RH STG INCREASE KNOWLEDGE OF SELF CARE AFTER HOSPITALIZATION ?Description: Patient and family will be able to manage care at discharge using medications and dietary modifications using handouts and educational resources independently ?Outcome: Progressing - walking the halls and completing ADL's without assistance ?  ?

## 2022-03-13 NOTE — Progress Notes (Signed)
Physical Therapy Session Note ? ?Patient Details  ?Name: Jesus [name] ?MRN: [id number] ?Date of Birth: 1941/08/28 ? ?Today's Date: 03/13/2022 ?PT Individual Time: 1103-1200 and 1401-1500 ?PT Individual Time Calculation (min): 57 min and 59 min ? ?Short Term Goals: ?Week 1:  PT Short Term Goal 1 (Week 1): STG= LTG based on ELOS ? ?Skilled Therapeutic Interventions/Progress Updates:  ? ?First session:  Pt presents sitting in recliner and agreeable to therapy.  Pt amb x 100' to dayroom w/ [facility] close supervision, but then requests to amb w/o AD.  Pt amb 100' w/ close S and occasional CGA for occasional decreased BOS, unsteadiness.  Pt puts mask on and loses balance requiring A from PT.  Pt performed sit to stand w/o UE support 12x in 30 seconds w/o LOB.  Pt performed cone obstacle course management w/o AD for weaving through, then alternating toe tapping to small cone w/ occasional LOB, and sidestepping B sides through cones before picking up cones using B hands.  Pt amb multiple trials throughout session w/o AD and close supervision to occasional CGA especially w/ turning, verbal cues for safety.  Pt performed obstacle course walking across mat w/ weights underneath, stepping on 6" platform and stepping over canes.  Pt amb > 200' w/ turns to return to room.  Pt wishes to use BR, supervision for clothing management and toilet transfer.  Pt continent of urine in toilet, NT will chart.  Pt returned to recliner  and remained sitting w/ seat alarm on and all needs in reach. ? ?Second session:Pt presents sitting in recliner and agreeable to therapy.  Pt amb multiple trials during session > 200' w/o AD and CGA.  Pt w/ noted change in cadence and gait, states LB "feels tired."  Pt performed car transfer w/ supervision although verbal cues needed for safe entrance to car ( would have attempted to put foot in first and then sit).  Pt performed gait w/ visual scanning for objects and minimal unsteadiness noted.  Pt has hearing  deficits which also factor into performance.  Pt negotiated 12 steps (4 x 3 ) w/ L HR ascending and performed reciprocal gait self selected, but noted dropping onto R foot when descending, so suggested step-to gait descending steps.  Pt agreed that this improved safety.  Pt performed floor ladder w/o AD and min A to CGA.  Pt returned to room and requested to use BR.  Pt amb to BR w/ close S, managed clothing and transferred on/off toilet w/ supervision.  Pt continent of urine in toilet, NT notified.  Pt amb to recliner and remained sitting w/ seat alarm on and all needs in reach. ?   ? ?Therapy Documentation ?Precautions:  ?Precautions ?Precautions: Fall ?Precaution Comments: BP ?Restrictions ?Weight Bearing Restrictions: No ?General: ?  ?Vital Signs: ?  ?Pain:0/10 ?  ? ? ? ? ?Therapy/Group: Individual Therapy ? ?[name] ?03/13/2022, 12:19 PM  ?

## 2022-03-14 DIAGNOSIS — I5022 Chronic systolic (congestive) heart failure: Secondary | ICD-10-CM

## 2022-03-14 LAB — BASIC METABOLIC PANEL
Anion gap: 8 (ref 5–15)
BUN: 34 mg/dL — ABNORMAL HIGH (ref 8–23)
CO2: 26 mmol/L (ref 22–32)
Calcium: 9 mg/dL (ref 8.9–10.3)
Chloride: 103 mmol/L (ref 98–111)
Creatinine, Ser: 1.69 mg/dL — ABNORMAL HIGH (ref 0.61–1.24)
GFR, Estimated: 40 mL/min — ABNORMAL LOW (ref >60)
Glucose, Bld: 137 mg/dL — ABNORMAL HIGH (ref 70–99)
Potassium: 4.4 mmol/L (ref 3.5–5.1)
Sodium: 137 mmol/L (ref 135–145)

## 2022-03-14 LAB — CBC
HCT: 34.1 % — ABNORMAL LOW (ref 39.0–52.0)
Hemoglobin: 11.2 g/dL — ABNORMAL LOW (ref 13.0–17.0)
MCH: 32.3 pg (ref 26.0–34.0)
MCHC: 32.8 g/dL (ref 30.0–36.0)
MCV: 98.3 fL (ref 80.0–100.0)
Platelets: 203 10*3/uL (ref 150–400)
RBC: 3.47 MIL/uL — ABNORMAL LOW (ref 4.22–5.81)
RDW: 12.8 % (ref 11.5–15.5)
WBC: 6.9 10*3/uL (ref 4.0–10.5)
nRBC: 0 % (ref 0.0–0.2)

## 2022-03-14 LAB — PROTIME-INR
INR: 2.6 — ABNORMAL HIGH (ref 0.8–1.2)
Prothrombin Time: 27.2 seconds — ABNORMAL HIGH (ref 11.4–15.2)

## 2022-03-14 MED ORDER — SODIUM CHLORIDE 0.45 % IV SOLN: INTRAVENOUS | Status: DC

## 2022-03-14 MED ORDER — WARFARIN SODIUM 4 MG PO TABS
4.0000 mg | ORAL_TABLET | Freq: Every day | ORAL | Status: DC
  Administered 2022-03-14: 4 mg via ORAL
  Filled 2022-03-14 (×2): qty 1

## 2022-03-14 MED ORDER — AMIODARONE HCL 200 MG PO TABS
200.0000 mg | ORAL_TABLET | Freq: Every day | ORAL | Status: DC
  Administered 2022-03-15: 200 mg via ORAL
  Filled 2022-03-14: qty 1

## 2022-03-14 MED ORDER — METOPROLOL SUCCINATE ER 25 MG PO TB24
25.0000 mg | ORAL_TABLET | Freq: Every day | ORAL | Status: DC
  Administered 2022-03-14: 25 mg via ORAL
  Filled 2022-03-14 (×2): qty 1

## 2022-03-14 NOTE — Progress Notes (Signed)
Pt refused fluids at the time. States he wants to wait until he gets ready for bed and changes later. Night nurse notified of pt request. ?Sheela Stack, LPN  ?

## 2022-03-14 NOTE — Progress Notes (Signed)
Physical Therapy Discharge Summary ? ?Patient Details  ?Name: Jesus [name] ?MRN: [id number] ?Date of Birth: 1941/01/16 ? ? ?Patient has met 9 of 9 long term goals due to improved activity tolerance, improved balance, improved postural control, increased strength, and ability to compensate for deficits.  Patient to discharge at an ambulatory level Modified Independent.   Patient's care partner unavailable to provide the necessary  no assist needed  assistance at discharge. ? ? ?Recommendation:  ?Patient will benefit from ongoing skilled PT services in home health setting to continue to advance safe functional mobility, address ongoing impairments in endurance, dynamic balance, and minimize fall risk. ? ?Equipment: ?SPC ? ?Reasons for discharge: treatment goals met and discharge from hospital ? ?Patient/family agrees with progress made and goals achieved: Yes ? ?PT Discharge ?Precautions/Restrictions ?Precautions ?Precautions: Fall ?Precaution Comments: BP ?Restrictions ?Weight Bearing Restrictions: No ? ?Pain ?Pain Assessment ?Pain Scale: 0-10 ?Pain Score: 0-No pain ?Pain Interference ?Pain Interference ?Pain Effect on Sleep: 0. Does not apply - I have not had any pain or hurting in the past 5 days ?Pain Interference with Therapy Activities: 0. Does not apply - I have not received rehabilitationtherapy in the past 5 days ?Pain Interference with Day-to-Day Activities: 1. Rarely or not at all ?Vision/Perception  ?Vision - History ?Ability to See in Adequate Light: 0 Adequate ?Perception ?Perception: Within Functional Limits ?Praxis ?Praxis: Intact  ?Cognition ?Overall Cognitive Status: Within Functional Limits for tasks assessed ?Arousal/Alertness: Awake/alert ?Orientation Level: Oriented X4 ?Selective Attention: Appears intact ?Memory: Appears intact ?Awareness: Appears intact ?Problem Solving: Appears intact ?Safety/Judgment: Appears intact ?Sensation ?Sensation ?Light Touch: Appears Intact ?Hot/Cold: Appears  Intact ?Proprioception: Appears Intact ?Stereognosis: Appears Intact ?Coordination ?Gross Motor Movements are Fluid and Coordinated: Yes ?Fine Motor Movements are Fluid and Coordinated: Yes ?Finger Nose Finger Test: WNL ?Heel Shin Test: [facility] ?Motor  ?Motor ?Motor: Abnormal postural alignment and control ?Motor - Skilled Clinical Observations: general weakness ?Motor - Discharge Observations: general weakness  ?Mobility ?Bed Mobility ?Bed Mobility: Rolling Right;Rolling Left;Supine to Sit;Sit to Supine ?Rolling Right: Independent ?Rolling Left: Independent ?Supine to Sit: Independent ?Sit to Supine: Independent ?Transfers ?Transfers: Sit to Stand;Stand to [employer] Transfers ?Sit to Stand: Independent with assistive device ?Stand to Sit: Independent with assistive device ?Stand Pivot Transfers: Independent with assistive device ?Transfer (Assistive device): Straight cane ?Locomotion  ?Gait ?Ambulation: Yes ?Gait Assistance: Independent with assistive device ?Gait Distance (Feet): 300 Feet ?Assistive device: Straight cane ?Gait ?Gait: Yes ?Gait Pattern: Impaired ?Gait Pattern: Narrow base of support;Trunk flexed;Lateral hip instability;Step-through pattern;Poor foot clearance - left;Poor foot clearance - right ?Gait velocity: decreased ?High Level Ambulation ?High Level Ambulation: Side stepping;Backwards walking ?Stairs / Additional Locomotion ?Stairs: Yes ?Stairs Assistance: Independent with assistive device ?Stair Management Technique: Two rails ?Number of Stairs: 4 ?Height of Stairs: 6 ?Ramp: Independent with assistive device ?Curb: Independent with assistive device ?Wheelchair Mobility ?Wheelchair Mobility: No  ?Trunk/Postural Assessment  ?Cervical Assessment ?Cervical Assessment: Exceptions to [facility] ?Thoracic Assessment ?Thoracic Assessment: Exceptions to [facility] ?Lumbar Assessment ?Lumbar Assessment: Exceptions to [facility] ?Postural Control ?Postural Control: Deficits on evaluation  ?Balance ?Balance ?Balance  Assessed: Yes ?Standardized Balance Assessment ?Standardized Balance Assessment: Timed Up and Go Test ?Berg Balance Test ?Sit to Stand: Able to stand  independently using hands ?Standing Unsupported: Able to stand safely 2 minutes ?Sitting with Back Unsupported but Feet Supported on Floor or Stool: Able to sit safely and securely 2 minutes ?Stand to Sit: Sits safely with minimal use of hands ?Transfers: Able to transfer safely, minor use  of hands ?Standing Unsupported with Eyes Closed: Able to stand 10 seconds safely ?Standing Ubsupported with Feet Together: Able to place feet together independently and stand for 1 minute with supervision ?From Standing, Reach Forward with Outstretched Arm: Can reach forward >5 cm safely (2") ?From Standing Position, Pick up Object from Floor: Able to pick up shoe safely and easily ?From Standing Position, Turn to Look Behind Over each Shoulder: Looks behind from both sides and weight shifts well ?Turn 360 Degrees: Able to turn 360 degrees safely but slowly ?Standing Unsupported, Alternately Place Feet on Step/Stool: Able to stand independently and complete 8 steps >20 seconds ?Standing Unsupported, One Foot in Front: Able to plae foot ahead of the other independently and hold 30 seconds ?Standing on One Leg: Tries to lift leg/unable to hold 3 seconds but remains standing independently ?Total Score: 45 ?Timed Up and Go Test ?TUG: Normal TUG ?Normal TUG (seconds): 13.3 ?Dynamic Sitting Balance ?Dynamic Sitting - Balance Support: Feet supported ?Dynamic Sitting - Level of Assistance: 7: Independent ?Static Standing Balance ?Static Standing - Level of Assistance: 7: Independent ?Dynamic Standing Balance ?Dynamic Standing - Level of Assistance: 6: Modified independent (Device/Increase time) ?Extremity Assessment  ?  ?  ?RLE Assessment ?RLE Assessment: Exceptions to [facility] ?General Strength Comments: Grossly 4/5 ?LLE Assessment ?LLE Assessment: Exceptions to [facility] ?General Strength Comments:  grossly 4/5 ? ? ? ?Debbora Dus ?Debbora Dus, PT, DPT, CBIS ? ?03/14/2022, 2:39 PM ?

## 2022-03-14 NOTE — Progress Notes (Signed)
Inpatient Rehabilitation Care Coordinator ?Discharge Note  ? ?Patient Details  ?Name: Jesus [name] ?MRN: [id number] ?Date of Birth: 1940-12-30 ? ? ?Discharge location: [place] ? ?Length of Stay: 7 days ? ?Discharge activity level: MOD/I LEVEL ? ?Home/community participation: ACTIVE ? ?Patient response VO:HYWVPX Literacy - How often do you need to have someone help you when you read instructions, pamphlets, or other written material from your doctor or pharmacy?: Never ? ?Patient response TG:GYIRSW Isolation - How often do you feel lonely or isolated from those around you?: Never ? ?Services provided included: MD, RD, PT, OT, RN, CM, Pharmacy, SW ? ?Financial Services:  ?Charity fundraiser Utilized: Private Insurance ?[employer]-MEDICARE ? ?Choices offered to/list presented to: PT ? ?Follow-up services arranged:  ?Home Health, DME, Patient/Family has no preference for HH/DME agencies ?Home Health Agency: [facility]  ?  ?DME : [facility] ?  ? ?Patient response to transportation need: ?Is the patient able to respond to transportation needs?: Yes ?In the past 12 months, has lack of transportation kept you from medical appointments or from getting medications?: No ?In the past 12 months, has lack of transportation kept you from meetings, work, or from getting things needed for daily living?: No ? ? ? ?Comments (or additional information):PT DID WELL AND REACHED MOD/I LEVEL. FAMILY WILL BE CHECKING ON HIM BUT NOT STAYING WITH HIM ? ?Patient/Family verbalized understanding of follow-up arrangements:  Yes ? ?Individual responsible for coordination of the follow-up plan: SELF AND FAYE-SISTER IN[facility] [phone] ? ?Confirmed correct DME delivered: Elease Hashimoto 03/14/2022   ? ?Elease Hashimoto ?

## 2022-03-14 NOTE — Progress Notes (Signed)
Scheduled lopressor held at 2025,per parameters. HR 51. Patient refused scheduled lidoderm patch. "I don't need it."Jesus [name], Roselyn Reef A  ?

## 2022-03-14 NOTE — Progress Notes (Addendum)
?                                                       PROGRESS NOTE ? ? ?Subjective/Complaints: ? ?Labs reviewed, BMET trending up ?Oral intake remains <1069m ?Discussed with PA, pt with hx of fluid overload at admit, diuretic meds increased per cardiology  ?Pt feels ok ?ROS: ? ?Pt denies SOB, abd pain, CP, N/V/(+) C/D, and vision changes ? ? ?Objective: ?  ?No results found. ?Recent Labs  ?  03/13/22 ?23976 ?WBC 6.9  ?HGB 11.2*  ?HCT 34.1*  ?PLT 203  ? ? ?Recent Labs  ?  03/14/22 ?0728  ?NA 137  ?K 4.4  ?CL 103  ?CO2 26  ?GLUCOSE 137*  ?BUN 34*  ?CREATININE 1.69*  ?CALCIUM 9.0  ? ? ? ?Intake/Output Summary (Last 24 hours) at 03/14/2022 1031 ?Last data filed at 03/14/2022 07341?Gross per 24 hour  ?Intake 960 ml  ?Output 225 ml  ?Net 735 ml  ? ?  ? ?  ? ?Physical Exam: ?Vital Signs ?Blood pressure (!) 135/55, pulse (!) 58, temperature 98.2 ?F (36.8 ?C), temperature source Oral, resp. rate 16, height '5\' 1"'$  (1.549 m), weight 53.4 kg, SpO2 93 %. ? ?General: No acute distress ?Mood and affect are appropriate ?Heart: Regular rate and rhythm no rubs murmurs or extra sounds ?Lungs: Clear to auscultation, breathing unlabored, no rales or wheezes ?Abdomen: Positive bowel sounds, soft nontender to palpation, nondistended ?Extremities: No clubbing, cyanosis, or edema ? ?Musculoskeletal: L neck muscles tight- L upper trap; levator, scalenes- TTP    ?   General: No swelling or tenderness. Normal range of motion.  ?   Cervical back: Normal range of motion and neck supple.  ?Skin: ?   General: Skin is warm and dry.  ?   Findings: No rash.  ?Neurological:  ?   Mental Status: He is alert and oriented to person, place, and time.  ?   Cranial Nerves: No cranial nerve deficit.  ?   Sensory: No sensory deficit.  ?   Comments: Reasonable insight and awareness. Functional memory. Normal language and speech. UE 4+/5 prox to distal. LE: 3/5 HF, 4-/5 KE and 4/5 ADF/PF. Normal reflexes. No abnl tone. Normal cerebellar  exam ? ?Assessment/Plan: ?1. Functional deficits which require 3+ hours per day of interdisciplinary therapy in a comprehensive inpatient rehab setting. ?Physiatrist is providing close team supervision and 24 hour management of active medical problems listed below. ?Physiatrist and rehab team continue to assess barriers to discharge/monitor patient progress toward functional and medical goals ? ?Care Tool: ? ?Bathing ? Bathing activity did not occur: Safety/medical concerns ?Body parts bathed by patient: Right arm, Left arm, Chest, Abdomen, Front perineal area, Buttocks, Right upper leg, Left upper leg, Right lower leg, Left lower leg, Face  ?   ?  ?  ?Bathing assist Assist Level: Contact Guard/Touching assist ?  ?  ?Upper Body Dressing/Undressing ?Upper body dressing   ?What is the patient wearing?: HPinehurstonly ?   ?Upper body assist Assist Level: Minimal Assistance - Patient > 75% ?   ?Lower Body Dressing/Undressing ?Lower body dressing ? ? ? Lower body dressing activity did not occur: Safety/medical concerns ?What is the patient wearing?: Pants ? ?  ? ?Lower body assist Assist for lower body dressing: Contact Guard/Touching  assist ?   ? ?Toileting ?Toileting    ?Toileting assist Assist for toileting: Supervision/Verbal cueing ?Assistive Device Comment: walker ?  ?Transfers ?Chair/bed transfer ? ?Transfers assist ?   ? ?Chair/bed transfer assist level: Contact Guard/Touching assist ?Chair/bed transfer assistive device: Other (none) ?  ?Locomotion ?Ambulation ? ? ?Ambulation assist ? ?   ? ?Assist level: Contact Guard/Touching assist ?Assistive device: No Device ?Max distance: >200'  ? ?Walk 10 feet activity ? ? ?Assist ?   ? ?Assist level: Contact Guard/Touching assist ?Assistive device: No Device  ? ?Walk 50 feet activity ? ? ?Assist   ? ?Assist level: Contact Guard/Touching assist ?Assistive device: No Device  ? ? ?Walk 150 feet activity ? ? ?Assist   ? ?Assist level: Contact Guard/Touching  assist ?Assistive device: No Device ?  ? ?Walk 10 feet on uneven surface  ?activity ? ? ?Assist   ? ? ?Assist level: Contact Guard/Touching assist ?Assistive device: Other (comment) (no device)  ? ?Wheelchair ? ? ? ? ?Assist Is the patient using a wheelchair?: No ?  ?  ? ?  ?   ? ? ?Wheelchair 50 feet with 2 turns activity ? ? ? ?Assist ? ?  ?  ? ? ?   ? ?Wheelchair 150 feet activity  ? ? ? ?Assist ?   ? ? ?   ? ?Blood pressure (!) 135/55, pulse (!) 58, temperature 98.2 ?F (36.8 ?C), temperature source Oral, resp. rate 16, height '5\' 1"'$  (1.549 m), weight 53.4 kg, SpO2 93 %. ? ?Medical Problem List and Plan: ?1. Functional deficits secondary to debility after systolic congestive heart failure and other medical complications ?            -patient may shower ?            -ELOS/Goals: 03/15/2021 appreciate cardiology eval ? Continue CIR- PT, OT  ?2.  Antithrombotics: ?-DVT/anticoagulation:  Pharmaceutical: Coumadin-INR therapeutic at 2.6, '4mg'$  warfarin today.  ?            -antiplatelet therapy: N/A ?3. L neck tightness/pain- continue lidocaine patch 8pm to 8am- no heat pack at night- just during day when patch is off- ?  ?4. Mood: LCSW to follow for evaluation and support.  ?            -antipsychotic agents: N/A ?5. Neuropsych: This patient is capable of making decisions on his own behalf. ?6. Skin/Wound Care: Routine pressure relief measures. ?7. Fluids/Electrolytes/Nutrition: Strict I/O. Check CMET in am  ?8. 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   ?            --Wt improved from 132 lbs-->122 (which is average overall) ?            --on Jardiance, Lasix, metoprolol, aldactone and Plavix. No ARB due to CKD ?            -daily weights ? 4/6- no weight- will remind nursing.  ? 4/7- weight down to 54.9 kg- down 0.6 mg again- likely due to being dry- will push fluids ?9: Orthostatic hypotension: Will continue to monitor orthostatic vitals. ?--TEDs for edema control and support.   ?4/5- not out of Bed yet, but will monitor for Sx's. ?4/7- doing better- con't TEDs  ?10. 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 Continue metoprolol and amiodarone. Change lopressor to 12.'5mg'$  BID so canbe crushed.  ?            --  coumadin per pharmacy ?12. AKI: BUN/SCr 29/1.57 at admission likely due to recent PTCA/dyte ?--Was WNL prior to cath 03/16 (past three months per Epic.) ?--SCr stabilizing to 1.3-1.4 range.  ?4/5- Cr 1.4-- in normal range for pt- con't to monitor ?4/7- Cr up to 1.60 and BUN 34- will recheck Monday and give IVFs- push fluids over weekend ? ?  Latest Ref Rng & Units 03/14/2022  ?  7:28 AM 03/11/2022  ?  5:06 AM 03/09/2022  ?  5:05 AM  ?BMP  ?Glucose 70 - 99 mg/dL 137   91   96    ?BUN 8 - 23 mg/dL 34   34   28    ?Creatinine 0.61 - 1.24 mg/dL 1.69   1.60   1.40    ?Sodium 135 - 145 mmol/L 137   137   135    ?Potassium 3.5 - 5.1 mmol/L 4.4   4.4   4.3    ?Chloride 98 - 111 mmol/L 103   104   104    ?CO2 22 - 32 mmol/L '26   24   24    '$ ?Calcium 8.9 - 10.3 mg/dL 9.0   9.0   8.9    ? Last 2 values trending up, may need nocturnal IVF tonite slow rate, will ask cardiology to re eval meds , just on low dose lasix, spironolactone stopped per cardiology today ?13. GERD: Continue protonix ?14. Iron deficiency anemia: Has been supplemented with Ferric gluconate 250 mg X 2 doses ?--monitor H/H with serial checks. ?4/6- CBC again in AM   ? ?  Latest Ref Rng & Units 03/13/2022  ? 11:57 PM 03/11/2022  ?  5:06 AM 03/09/2022  ?  5:05 AM  ?CBC  ?WBC 4.0 - 10.5 K/uL 6.9   8.8   10.0    ?Hemoglobin 13.0 - 17.0 g/dL 11.2   11.4   11.2    ?Hematocrit 39.0 - 52.0 % 34.1   33.8   33.8    ?Platelets 150 - 400 K/uL 203   223   229    ?  ?15. Constipation ? 4/7- will give sorbitol to get him to have BM- clean out ? ?  ? ?LOS: ?6 days ?A FACE TO FACE EVALUATION WAS PERFORMED ? ?Luanna Salk Shuntae Herzig ?03/14/2022, 10:31 AM  ? ? ? ?

## 2022-03-14 NOTE — Progress Notes (Signed)
Patient ID: Jesus [name], male   DOB: 1941-11-18, 81 y.o.   MRN: [id number]  Met with pt to let him know the team feels he has met his goals and is ready for discharge tomorrow. MD is checking his medical issues and will need until tomorrow am to decide if ready. Have contacted faye-sister in-law to let her know and be prepared. She wants to make sure he is ready since he lives alone. Will touch base with her in the am regarding discharge. ?

## 2022-03-14 NOTE — Progress Notes (Addendum)
ANTICOAGULATION CONSULT NOTE - Follow Up Consult ? ?Pharmacy Consult for Warfarin ?Indication: TAVR / PAF ? ?Allergies  ?Allergen Reactions  ? Amlodipine Other (See Comments)  ?  Swelling?  ? Codeine Other (See Comments)  ?  Pt does not remember  ? Iodine Swelling  ? ? ?Patient Measurements: ?Height: '5\' 1"'$  (154.9 cm) ?Weight: 53.4 kg (117 lb 11.6 oz) ?IBW/kg (Calculated) : 52.3 ? ?Vital Signs: ?Temp: 98.2 ?F (36.8 ?C) (04/10 0421) ?Temp Source: Oral (04/10 0421) ?BP: 135/55 (04/10 0810) ?Pulse Rate: 58 (04/10 0810) ? ?Labs: ?Recent Labs  ?  03/12/22 ?0609 03/13/22 ?0647 03/13/22 ?2357 03/14/22 ?4332  ?HGB  --   --  11.2*  --   ?HCT  --   --  34.1*  --   ?PLT  --   --  203  --   ?LABPROT 27.3* 26.5*  --  27.2*  ?INR 2.6* 2.5*  --  2.6*  ?CREATININE  --   --   --  1.69*  ? ? ? ?Estimated Creatinine Clearance: 24.5 mL/min (A) (by C-G formula based on SCr of 1.69 mg/dL (H)). ? ? ?Assessment: ?On warfarin PTA for Afib, 4 mg daily PTA ?INR therapeutic at 2.6 today. Last CBC stable (4/9) ?No bleed issues reported ?Noted on amiodarone (PTA dose) ? ?Goal of Therapy:  ?INR 2-3 ?Monitor platelets by anticoagulation protocol: Yes ?  ?Plan:  ?Warfarin 4 mg daily ?Monitor INR MWF for 1 week and for s/sx bleeding ? ?Dani Anastasovites BS ?Pharmacy Student ?03/14/2022 9:42 AM ? ?I agree with the contents of this note.  ? ?Solmon Bohr BS, PharmD, BCPS ?Clinical Pharmacist ?03/14/2022 11:57 AM ? ?Contact: [phone] after 3 PM ? ?"Be curious, not judgmental..." -Jamal Maes ?

## 2022-03-14 NOTE — Progress Notes (Signed)
Physical Therapy Session Note ? ?Patient Details  ?Name: Jesus [name] ?MRN: [id number] ?Date of Birth: Mar 07, 1941 ? ?Today's Date: 03/14/2022 ?PT Individual Time: 0805-0900 ?PT Individual Time Calculation (min): 55 min  ? ?Short Term Goals: ?Week 1:  PT Short Term Goal 1 (Week 1): STG= LTG based on ELOS ? ?Skilled Therapeutic Interventions/Progress Updates:  ?   ? ?Pt sitting in recliner to start session - agreeable to PT tx and denies pain. LPN in room for morning medications. Assisted pt with donning underwear/jeans and t-shirt - all completed with setupA. Donned knee-high compression socks with totalA for time and he was able to don shoes (including tieing) with setupA as well. Ambulated sinkside with supervision and no AD where he completed a few self care tasks such as applying deodorant to underarms and combing hair.  ? ?Ambulated ~137f with SPC and supervision to main rehab gym - pt carrying cane rather than sequencing it with gait. In rehab gym, worked on standing balance using small sized declined wedge - feet apart/together and eyes open/closed without UE support - CGA provided with increased sway eyes closed but no LOB - added challenge with dyanmic movements with using his SPC while standing on wedge and also mini squats on wedge.  ? ?In stairwell, we practiced stair navigation using 1 hand rail - he navigated up/down 2x15 steps without rest break with light minA needed for safety and VC needed for awareness of foot placement to ensure full foot on step. Ambulated back to his room, ~1076f with supervision and SPC. Concluded session seated in recliner, chair alarm on, all needs in reach.  ? ?Therapy Documentation ?Precautions:  ?Precautions ?Precautions: Fall ?Precaution Comments: BP ?Restrictions ?Weight Bearing Restrictions: No ?General: ?  ? ?Therapy/Group: Individual Therapy ? ?[place] Korver P Biridiana Twardowski ?03/14/2022, 8:44 AM  ?

## 2022-03-14 NOTE — Consult Note (Signed)
?Cardiology Consultation:  ?Patient ID: Jesus [name] ?MRN: [id number]; DOB: 12-01-1941 ? ?Admit date: 03/08/2022 ?Date of Consult: 03/14/2022 ? ?Primary Care Provider: [name], MD ?Primary Cardiologist: [name], MD  ?Primary Electrophysiologist:  None  ? ?Patient Profile:  ?Jesus [name] is a 81 y.o. male with a hx of CAD status post CABG, persistent atrial fibrillation on Coumadin, subaortic stenosis status post TAVR, CKD, hypertension who is being seen today for the evaluation of congestive heart failure at the request of [name], MD. ? ?History of Present Illness:  ?Mr. Printy is currently admitted to the inpatient rehab unit.  He was admitted to the hospital between 02/27/2022 and 03/08/2022 for acute on chronic systolic heart failure and acute respiratory failure.  He was adequately diuresed and rehab was recommended.  He has been doing well in the rehab unit.  The rehab unit has asked [place] to weigh in on his heart failure medications.  They have noticed a slight increase in his serum creatinine today.  Serum creatinine this admission has ranged between 1.3-1.7.  Currently this morning 1.69.  Prior to admission values were around 1.1.  He denies any symptoms of chest pain or trouble breathing.  He is euvolemic on examination.  If anything he is likely over diuresed.  Recent ejection fraction 35-40% with inferior/inferolateral akinesis.  In March of this year he underwent drug-eluting stent placement to the vein graft to his PDA.  His LIMA to LAD is patent.  Vein graft to OM 3 is patent.  He has an occluded left main and occluded RCA. ? ?Heart Pathway Score:   ?   ? ?Past Medical History: ?Past Medical History:  ?Diagnosis Date  ? 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 Aortic stenosis   ? mild with a mean aortic valve gradient of 12 mmHg  ? Atrial fibrillation ([place])   ? holding sinus rhythm on Amiodarone  ? CAD (coronary artery disease)   ? a. S/P Ant MI 1980;  b.  1997 S/P CABG x 8 (LIMA to diag-LAD, SVG to OM1-OM2-OM3, SVG to [facility] - Dr [name]);  c. 01/2015 Cath: 3VD, 8/8 patent grafts.  ? Cardiomyopathy   ? Dysrhythmia   ? Esophageal reflux   ? GERD (gastroesophageal reflux disease)   ? Headache   ? History of colonoscopy   ? History of transesophageal echocardiography (TEE) for monitoring   ? Other and unspecified hyperlipidemia   ? Paroxysmal atrial fibrillation ([place]) 02/20/2022  ? S/P angioplasty with stent 02/17/22 DES to proximal VG to PDA 02/20/2022  ? S/P TAVR (transcatheter aortic valve replacement)   ? a. 03/2015 26 mm Edwards Sapien 3 transcatheter heart valve placed via open left transfemoral approach.  ? Severe aortic stenosis 08/10/2012  ? Skin lesions, generalized   ? facial which [name] represent actinic keratoses and possible photosensitivity from Amiodarone  ? Unspecified essential hypertension   ? ? ?Past Surgical History: ?Past Surgical History:  ?Procedure Laterality Date  ? CARDIAC CATHETERIZATION    ? CARDIOVERSION  11/18/2006  ? Dr. [name]  ? CATARACT EXTRACTION W/ INTRAOCULAR LENS IMPLANT  April '13  (Dr. [name])  ? left eye only  ? CHOLECYSTECTOMY    ? CORONARY ARTERY BYPASS GRAFT  02/09/1996  ? LIMA to diag-LAD, SVG to OM1-OM2-OM3, SVG to [facility]  ? CORONARY STENT INTERVENTION N/A 02/17/2022  ? Procedure: CORONARY STENT INTERVENTION;  Surgeon: Burnell Blanks, MD;  Location: [place] CV LAB;  Service: Cardiovascular;  Laterality:  N/A;  ? CORONARY/GRAFT ANGIOGRAPHY N/A 02/17/2022  ? Procedure: CORONARY/GRAFT ANGIOGRAPHY;  Surgeon: Burnell Blanks, MD;  Location: [place] CV LAB;  Service: Cardiovascular;  Laterality: N/A;  ? EYE SURGERY    ? LEFT AND RIGHT HEART CATHETERIZATION WITH CORONARY/GRAFT ANGIOGRAM N/A 01/26/2015  ? Procedure: LEFT AND RIGHT HEART CATHETERIZATION WITH [name];  Surgeon: [name], MD;  Location: [facility] CATH LAB;  Service: Cardiovascular;  Laterality: N/A;  ? TEE WITHOUT  CARDIOVERSION N/A 03/10/2015  ? Procedure: TRANSESOPHAGEAL ECHOCARDIOGRAM (TEE);  Surgeon: [name], MD;  Location: [place];  Service: Open Heart Surgery;  Laterality: N/A;  ? TONSILLECTOMY    ? TRANSCATHETER AORTIC VALVE REPLACEMENT, TRANSFEMORAL N/A 03/10/2015  ? Procedure: TRANSCATHETER AORTIC VALVE REPLACEMENT, TRANSFEMORAL;  Surgeon: [name], MD;  Location: [place];  Service: Open Heart Surgery;  Laterality: N/A;  ?  ? ?Home Medications:  ?Prior to Admission medications   ?Medication Sig Start Date End Date Taking? Authorizing Provider  ?albuterol (VENTOLIN HFA) 108 (90 Base) MCG/ACT inhaler INHALE 2 PUFFS BY MOUTH EVERY 4 HOURS ASNEEDED FOR WHEEZING OR SHORTNESS OF BREATH. ?Patient taking differently: 2 puffs every 4 (four) hours as needed for wheezing or shortness of breath. 01/27/21   [name], [name], MD  ?amiodarone (PACERONE) 200 MG tablet Take 2 tablets (400 mg total) by mouth daily. 03/08/22   Domenic Polite, MD  ?amoxicillin (AMOXIL) 400 MG/5ML suspension TAKE 5 TEASPOONFULS BY MOUTH 1 HOUR PRIOR TO DENTAL PROCEDURE ?Patient taking differently: Take 2,000 mg by mouth See admin instructions. TAKE 5 TEASPOONFULS BY MOUTH 1 HOUR PRIOR TO DENTAL PROCEDURE 04/19/21   [name], [name], MD  ?Ascorbic Acid (VITAMIN C) 1000 MG tablet Take 1,000 mg by mouth daily.    [provider]  ?aspirin 81 MG tablet Take 81 mg by mouth at bedtime.    [provider]  ?atorvastatin (LIPITOR) 80 MG tablet Take 1 tablet (80 mg total) by mouth daily. 02/21/22   Isaiah Serge, NP  ?clopidogrel (PLAVIX) 75 MG tablet Take 1 tablet (75 mg total) by mouth daily with breakfast. 02/21/22   Isaiah Serge, NP  ?diclofenac Sodium (VOLTAREN) 1 % GEL Apply 4 g topically 4 (four) times daily. 02/24/22   Deno Etienne, DO  ?empagliflozin (JARDIANCE) 10 MG TABS tablet Take 1 tablet (10 mg total) by mouth daily. 03/03/22   Domenic Polite, MD  ?furosemide (LASIX) 20 MG tablet Take 1 tablet (20 mg total) by mouth daily.  03/09/22   Domenic Polite, MD  ?loratadine (CLARITIN) 10 MG tablet Take 10 mg by mouth daily as needed for allergies.    [provider]  ?metoprolol succinate (TOPROL-XL) 25 MG 24 hr tablet Take 1 tablet (25 mg total) by mouth daily. 03/03/22   Domenic Polite, MD  ?Multiple Vitamins-Minerals (MULTIVITAMIN,TX-MINERALS) tablet Take 1 tablet by mouth daily.    [provider]  ?nitroGLYCERIN (NITROSTAT) 0.4 MG SL tablet Place 1 tablet (0.4 mg total) under the tongue every 5 (five) minutes x 3 doses as needed for chest pain. ?Patient taking differently: Place 0.4 mg under the tongue every 5 (five) minutes as needed for chest pain. 02/20/22   Isaiah Serge, NP  ?spironolactone (ALDACTONE) 25 MG tablet Take 1 tablet (25 mg total) by mouth daily. 03/03/22   Domenic Polite, MD  ?warfarin (COUMADIN) 4 MG tablet TAKE 1 TABLET BY MOUTH DAILY OR TAKE AS DIRECTED BY ANTICOAGULATION CLINIC ?Patient taking differently: Take 4 mg by mouth every evening. 01/10/22  [name], MD  ? ? ?Inpatient Medications: ?Scheduled Meds: ? amiodarone  400 mg Oral Daily  ? atorvastatin  80 mg Oral Daily  ? clopidogrel  75 mg Oral Daily  ? empagliflozin  10 mg Oral Daily  ? furosemide  20 mg Oral Daily  ? lidocaine  1 patch Transdermal Q24H  ? metoprolol tartrate  12.5 mg Oral BID  ? multivitamin with minerals  1 tablet Oral Daily  ? pantoprazole sodium  40 mg Per Tube Daily  ? spironolactone  25 mg Oral Daily  ? warfarin  4 mg Oral q1600  ? Warfarin - Pharmacist Dosing Inpatient   Does not apply [id number]  ? ?Continuous Infusions: ? sodium chloride    ? ?PRN Meds: ?acetaminophen, albuterol, alum & mag hydroxide-simeth, diphenhydrAMINE, guaiFENesin-dextromethorphan, HYDROcodone-acetaminophen, loratadine, melatonin, methocarbamol, polyethylene glycol, prochlorperazine **OR** prochlorperazine **OR** prochlorperazine, sodium phosphate, sorbitol, traZODone ? ?Allergies:    ?Allergies  ?Allergen Reactions  ? Amlodipine Other (See  Comments)  ?  Swelling?  ? Codeine Other (See Comments)  ?  Pt does not remember  ? Iodine Swelling  ? ? ?Social History:   ?Social History  ? ?Socioeconomic History  ? Marital status: Married  ?  Spouse name: Not o

## 2022-03-14 NOTE — Patient Care Conference (Signed)
Inpatient RehabilitationTeam Conference and Plan of Care Update ?Date: 03/14/2022   Time: 2:13 PM  ? ? ?Patient Name: Jesus [name]      ?Medical Record Number: [id number]  ?Date of Birth: Apr 11, 1941 ?Sex: Male         ?Room/Bed: 4W20C/4W20C-01 ?Payor Info: Payor: [employer] scientist / Plan: [facility] MEDICARE / Product Type: *No Product type* /   ? ?Admit Date/Time:  03/08/2022  3:11 PM ? ?Primary Diagnosis:  Debility ? ?Hospital Problems: Principal Problem: ?  Debility ? ? ? ?Expected Discharge Date: Expected Discharge Date: 03/16/22 ? ?Team Members Present: ?Physician leading conference: Dr. [name] ?Social Worker Present: Ovidio Kin, LCSW ?Nurse Present: [name], RN ?PT Present: Estevan Ryder, PT ?OT Present: Meriel Pica, OT ? ?   Current Status/Progress Goal Weekly Team Focus  ?Bowel/Bladder ? ? continent x2  to remain continent x2      ?Swallow/Nutrition/ Hydration ? ?           ?ADL's ? ? distant supervision overall  Mod I overall  pt education, ADL/IADL training, strengthening and balance   ?Mobility ? ? ModI gait household distances, spv longer distances with SPC and without, ModIstairs using HR, ModI car transfer  ModI  d/c planning, pt/fam ed, endurance, functoinal strength   ?Communication ? ?           ?Safety/Cognition/ Behavioral Observations ? Compliant with safety precautions and notifying staff for assistance  continuing to follow safety precautions      ?Pain ? ? pt denies pain  pain level remains <3 and to notify nursing to address pain      ?Skin ? ? Skin is intact  skin integrity will be maintained without breakdown      ? ? ?Discharge Planning:  ?Home alone with intermittent assist from family    ?Team Discussion: ?Patient is doing well with therapy; ?Patient on target to meet rehab goals: ?Yes; he is doing well with his mobility. did need some cues with 15 steps with PT this am but says he only has 2 steps with a rail to go up at his house.  His sister in law would be  picking him and he might even stay with her ? ?*See Care Plan and progress notes for long and short-term goals.  ? ?Revisions to Treatment Plan:  ?N/A  ?Teaching Needs: ?Safety, medications, dietary modifications, etc  ?Current Barriers to Discharge: ?Decreased caregiver support ? ?Possible Resolutions to Barriers: ?OP follow up services ?DME: SPC ?  ? ? Medical Summary ?  ?  ?  ?  ? ? ?  ? ? ?I attest that I was present, lead the team conference, and concur with the assessment and plan of the team. ? ? ?[name] B ?03/14/2022, 2:14 PM  ? ? ? ? ? ? ?

## 2022-03-14 NOTE — Plan of Care (Signed)
?  Problem: RH Balance Goal: LTG Patient will maintain dynamic sitting balance (PT) Description: LTG:  Patient will maintain dynamic sitting balance with assistance during mobility activities (PT) Outcome: Completed/Met Goal: LTG Patient will maintain dynamic standing balance (PT) Description: LTG:  Patient will maintain dynamic standing balance with assistance during mobility activities (PT) Outcome: Completed/Met   Problem: Sit to Stand Goal: LTG:  Patient will perform sit to stand with assistance level (PT) Description: LTG:  Patient will perform sit to stand with assistance level (PT) Outcome: Completed/Met   Problem: RH Bed Mobility Goal: LTG Patient will perform bed mobility with assist (PT) Description: LTG: Patient will perform bed mobility with assistance, with/without cues (PT). Outcome: Completed/Met   Problem: RH Bed to Chair Transfers Goal: LTG Patient will perform bed/chair transfers w/assist (PT) Description: LTG: Patient will perform bed to chair transfers with assistance (PT). Outcome: Completed/Met   Problem: RH Car Transfers Goal: LTG Patient will perform car transfers with assist (PT) Description: LTG: Patient will perform car transfers with assistance (PT). Outcome: Completed/Met   Problem: RH Ambulation Goal: LTG Patient will ambulate in controlled environment (PT) Description: LTG: Patient will ambulate in a controlled environment, # of feet with assistance (PT). Outcome: Completed/Met Goal: LTG Patient will ambulate in home environment (PT) Description: LTG: Patient will ambulate in home environment, # of feet with assistance (PT). Outcome: Completed/Met   Problem: RH Stairs Goal: LTG Patient will ambulate up and down stairs w/assist (PT) Description: LTG: Patient will ambulate up and down # of stairs with assistance (PT) Outcome: Completed/Met   

## 2022-03-14 NOTE — Progress Notes (Signed)
Occupational Therapy Session Note ? ?Patient Details  ?Name: Jesus [name] ?MRN: [id number] ?Date of Birth: 1941/06/16 ? ?Today's Date: 03/14/2022 ?OT Individual Time: 0930-1030 ?OT Individual Time Calculation (min): 60 min  ? ? ?Short Term Goals: ?Week 1:  OT Short Term Goal 1 (Week 1): STG=LTG d/t ELOS ? ?Skilled Therapeutic Interventions/Progress Updates:  ?  Pt received in recliner dressed and ready for the day. Pt ambulated to ADL apt (over 300 feet) with close S and no AD.  In apartment, he practiced reaching into cabinets to retrieve pots and pans, food from cabinets with no difficulty.  Pt discussed what he typically makes for his meals and his new heart healthy diet.  Used packaged food samples to discuss sodium levels and healthier options. ? ?Set up shower stall box to have pt practice stepping in and out of shower several times with no LOB.   ? ?Pt then ambulated to the gym to work on strengthening balance exercises using a weighted ball with standing overhead lifts, squats, oblique twists. Pt did well with all of the exercises and did not demonstrate fatigue.     ? ?He then ambulated back to his room to use the restroom with no A needed.   ? ?Pt is eager to return home tomorrow. Will communicate with MD and team to see if pt is medically ready.  Pt resting in recliner with all needs met.  ? ?Therapy Documentation ?Precautions:  ?Precautions ?Precautions: Fall ?Precaution Comments: BP ?Restrictions ?Weight Bearing Restrictions: No ? ?  ?Vital Signs: ?Therapy Vitals ?Pulse Rate: (!) 58 ?BP: (!) 135/55 ?Pain: ?Pain Assessment ?Pain Scale: 0-10 ?Pain Score: 0-No pain ? ? ?Therapy/Group: Individual Therapy ? ?[place] ?03/14/2022, 8:46 AM ?

## 2022-03-14 NOTE — Progress Notes (Signed)
Physical Therapy Session Note ? ?Patient Details  ?Name: Jesus [name] ?MRN: [id number] ?Date of Birth: [birth year] ? ?Today's Date: 03/14/2022 ?PT Individual Time: 3818-2993; 7169-6789 ?PT Individual Time Calculation (min): 45 min and 55 mins ? ?Short Term Goals: ?Week 1:  PT Short Term Goal 1 (Week 1): STG= LTG based on ELOS ? ?Skilled Therapeutic Interventions/Progress Updates:  ? Session 1:  Patient received sitting up in recliner, agreeable to PT. He denies pain. Able to ambulate to therapy gym with no AD and distant spv. PT providing patient with HEP:  ?Hooklying bridges, sidelying hip abduction, SLR, hamstring stretch, sit <> stand, towel scrunches, standing hip abd, standing hip ext, mini squat, standing marches, standing heel raises. Patient tolerating therex well. Ambulating ~378f back to his room with no AD and distant supervision. 1 LOB avoiding obstacle, but patient able to recover independently using stepping strategy. Patient remaining up in recliner, chair alarm on, call light within reach.  ? ?Session 2: Patient received sitting up in recliner, agreeable to PT. He denies pain. PT making patient ModI in his room. Discussed with patient that he can still use call light if he's not feeling well/needs something from nursing. He verbalized understanding. Patient ambulating to ortho gym ModI. Able to transfer into car ModI. Ambulating up ramp and over mulch with distant supervision. Patient completing Kinetron with BLE at 30cm/s. Patient ambulating through obstacle course with light CGA: stepping over hurdles, toe taps to cone, up and over foam, pick up bean bags. Patient ambulating back to his room- requesting to use bathroom. ModI with continent void. Remaining up in recliner, needs within reach.  ? ? ?Therapy Documentation ?Precautions:  ?Precautions ?Precautions: Fall ?Precaution Comments: BP ?Restrictions ?Weight Bearing Restrictions: No ? ? ? ?Therapy/Group: Individual Therapy ? ?JDebbora Dus?JDebbora Dus PT, DPT, CBIS ? ?03/14/2022, 7:32 AM  ?

## 2022-03-15 ENCOUNTER — Telehealth: Payer: Self-pay

## 2022-03-15 ENCOUNTER — Encounter (HOSPITAL_COMMUNITY): Payer: Medicare Other

## 2022-03-15 DIAGNOSIS — R001 Bradycardia, unspecified: Secondary | ICD-10-CM

## 2022-03-15 LAB — BASIC METABOLIC PANEL
Anion gap: 6 (ref 5–15)
BUN: 36 mg/dL — ABNORMAL HIGH (ref 8–23)
CO2: 27 mmol/L (ref 22–32)
Calcium: 8.7 mg/dL — ABNORMAL LOW (ref 8.9–10.3)
Chloride: 104 mmol/L (ref 98–111)
Creatinine, Ser: 1.6 mg/dL — ABNORMAL HIGH (ref 0.61–1.24)
GFR, Estimated: 42 mL/min — ABNORMAL LOW (ref >60)
Glucose, Bld: 98 mg/dL (ref 70–99)
Potassium: 4.3 mmol/L (ref 3.5–5.1)
Sodium: 137 mmol/L (ref 135–145)

## 2022-03-15 MED ORDER — FUROSEMIDE 20 MG PO TABS: 20.0000 mg | ORAL_TABLET | Freq: Every day | ORAL | 0 refills | Status: DC | PRN

## 2022-03-15 MED ORDER — ACETAMINOPHEN 325 MG PO TABS: 325.0000 mg | ORAL_TABLET | ORAL | Status: DC | PRN

## 2022-03-15 MED ORDER — MELATONIN 5 MG PO TABS: 5.0000 mg | ORAL_TABLET | Freq: Every evening | ORAL | 0 refills | Status: DC | PRN

## 2022-03-15 MED ORDER — AMIODARONE HCL 200 MG PO TABS: 200.0000 mg | ORAL_TABLET | Freq: Every day | ORAL | 0 refills | Status: DC

## 2022-03-15 MED ORDER — LIDOCAINE 5 % EX PTCH
1.0000 | MEDICATED_PATCH | CUTANEOUS | 0 refills | Status: DC
Start: 2022-03-15 — End: 2022-03-21

## 2022-03-15 MED ORDER — EMPAGLIFLOZIN 10 MG PO TABS: 10.0000 mg | ORAL_TABLET | Freq: Every day | ORAL | 0 refills | Status: DC

## 2022-03-15 MED ORDER — METHOCARBAMOL 500 MG PO TABS: 500.0000 mg | ORAL_TABLET | Freq: Four times a day (QID) | ORAL | 0 refills | Status: DC | PRN

## 2022-03-15 MED ORDER — METOPROLOL SUCCINATE ER 25 MG PO TB24: 25.0000 mg | ORAL_TABLET | Freq: Every day | ORAL | 0 refills | Status: DC

## 2022-03-15 MED ORDER — LIDOCAINE 5 % EX PTCH
1.0000 | MEDICATED_PATCH | CUTANEOUS | 0 refills | Status: DC
Start: 2022-03-15 — End: 2022-03-15

## 2022-03-15 MED ORDER — WARFARIN SODIUM 4 MG PO TABS: 4.0000 mg | ORAL_TABLET | Freq: Every evening | ORAL | 0 refills | Status: DC

## 2022-03-15 MED ORDER — CLOPIDOGREL BISULFATE 75 MG PO TABS: 75.0000 mg | ORAL_TABLET | Freq: Every day | ORAL | 0 refills | Status: DC

## 2022-03-15 NOTE — Progress Notes (Signed)
Continued to educate pt on importance or oral fluid intake. Pt encouraged to drink water. Pt in agreement. Water given to pt.  ?Sheela Stack, LPN  ?

## 2022-03-15 NOTE — Progress Notes (Signed)
Occupational Therapy Discharge Summary ? ?Patient Details  ?Name: Jesus [name] ?MRN: [id number] ?Date of Birth: 01-23-1941 ? ? ?Patient has met 10 of 10 long term goals due to improved activity tolerance, improved balance, postural control, and ability to compensate for deficits.  Patient to discharge at overall Modified Independent level.  Patient's care partner is independent to provide the necessary physical assistance at discharge for intermittent needs with IADLs. Recommended no driving or working until he is cleared by his cardiologist.  ? ?Pt education only as pt is at a Mod Independent level.   ? ?Reasons goals not met: n/a ? ?Recommendation:  ?No further OT services needed at this time. ? ?Equipment: ?No equipment provided ? ?Reasons for discharge: treatment goals met ? ?Patient/family agrees with progress made and goals achieved: Yes ? ?OT Discharge ?Precautions/Restrictions  ?Precautions ?Precautions: Fall ?Precaution Comments: BP ?Restrictions ?Weight Bearing Restrictions: No ? ?  ?Vital Signs ?Therapy Vitals ?Temp: 98.5 ?F (36.9 ?C) ?Temp Source: Oral ?Pulse Rate: (!) 50 ?Resp: 16 ?BP: (!) 119/57 ?Patient Position (if appropriate): Lying ?Oxygen Therapy ?SpO2: 97 % ?O2 Device: Room Air ? ?  ?ADL ?ADL ?Eating: Independent ?Grooming: Independent ?Upper Body Bathing: Independent ?Where Assessed-Upper Body Bathing: Shower ?Lower Body Bathing: Modified independent ?Where Assessed-Lower Body Bathing: Shower ?Upper Body Dressing: Independent ?Lower Body Dressing: Modified independent ?Toileting: Independent ?Where Assessed-Toileting: Toilet ?Toilet Transfer: Independent ?Toilet Transfer Method: Ambulating ?Walk-In Shower Transfer: Independent ?Walk-In Shower Transfer Method: Ambulating ?Walk-In Shower Equipment: Grab bars, Shower seat without back ?Vision ?Baseline Vision/History: 1 Wears glasses ?Patient Visual Report: No change from baseline ?Vision Assessment?: No apparent visual deficits ?Perception   ?Perception: Within Functional Limits ?Praxis ?Praxis: Intact ?Cognition ?Cognition ?Overall Cognitive Status: Within Functional Limits for tasks assessed ?Arousal/Alertness: Awake/alert ?Person: Oriented ?Place: Oriented ?Situation: Oriented ?Memory: Appears intact ?Selective Attention: Appears intact ?Awareness: Appears intact ?Problem Solving: Appears intact ?Safety/Judgment: Appears intact ?Brief Interview for Mental Status (BIMS) ?Repetition of Three Words (First Attempt): 3 ?Temporal Orientation: Year: Correct ?Temporal Orientation: Month: Accurate within 5 days ?Temporal Orientation: Day: Correct ?Recall: "Sock": Yes, after cueing ("something to wear") ?Recall: "Blue": Yes, after cueing ("a color") ?Recall: "Bed": Yes, after cueing ("a piece of furniture") ?BIMS Summary Score: 12 ?Sensation ?Sensation ?Light Touch: Appears Intact ?Hot/Cold: Appears Intact ?Proprioception: Appears Intact ?Stereognosis: Appears Intact ?Coordination ?Gross Motor Movements are Fluid and Coordinated: Yes ?Fine Motor Movements are Fluid and Coordinated: Yes ?Finger Nose Finger Test: WNL ?Heel Shin Test: [facility] ?Motor  ?Motor ?Motor: Abnormal postural alignment and control ?Motor - Discharge Observations: general weakness, kyphotic posture ?Mobility  ?Transfers ?Sit to Stand: Independent ?Stand to Sit: Independent  ?Trunk/Postural Assessment  ?Thoracic Assessment ?Thoracic Assessment: Exceptions to [facility] (kyphotic posture) ?Postural Control ?Postural Control: Deficits on evaluation (limited due to kyphosis)  ?Balance ?Static Standing Balance ?Static Standing - Level of Assistance: 7: Independent ?Dynamic Standing Balance ?Dynamic Standing - Level of Assistance: 6: Modified independent (Device/Increase time) ?Extremity/Trunk Assessment ?RUE Assessment ?RUE Assessment: Within Functional Limits ?LUE Assessment ?LUE Assessment: Within Functional Limits ? ? ?[place] ?03/15/2022, 8:46 AM ?

## 2022-03-15 NOTE — Progress Notes (Signed)
?                                                       PROGRESS NOTE ? ? ?Subjective/Complaints: ? ?Discussed with cardiology medication changes has 3 cardiology appointments over the next 3 weeks post discharge ?Pt feels ok ?ROS: ? ?Pt denies SOB, abd pain, CP, N/V/(+) C/D, and vision changes ? ? ?Objective: ?  ?No results found. ?Recent Labs  ?  03/13/22 ?7209  ?WBC 6.9  ?HGB 11.2*  ?HCT 34.1*  ?PLT 203  ? ? ?Recent Labs  ?  03/14/22 ?0728 03/15/22 ?4709  ?NA 137 137  ?K 4.4 4.3  ?CL 103 104  ?CO2 26 27  ?GLUCOSE 137* 98  ?BUN 34* 36*  ?CREATININE 1.69* 1.60*  ?CALCIUM 9.0 8.7*  ? ? ? ?Intake/Output Summary (Last 24 hours) at 03/15/2022 1239 ?Last data filed at 03/14/2022 2328 ?Gross per 24 hour  ?Intake 120 ml  ?Output 480 ml  ?Net -360 ml  ? ?  ? ?  ? ?Physical Exam: ?Vital Signs ?Blood pressure 115/66, pulse (!) 50, temperature 98.5 ?F (36.9 ?C), temperature source Oral, resp. rate 16, height '5\' 1"'$  (1.549 m), weight 55.1 kg, SpO2 97 %. ? ?General: No acute distress ?Mood and affect are appropriate ?Heart: Regular rate and rhythm no rubs murmurs or extra sounds ?Lungs: Clear to auscultation, breathing unlabored, no rales or wheezes ?Abdomen: Positive bowel sounds, soft nontender to palpation, nondistended ?Extremities: No clubbing, cyanosis, or edema ? ?Musculoskeletal: L neck muscles tight- L upper trap; levator, scalenes- TTP    ?   General: No swelling or tenderness. Normal range of motion.  ?   Cervical back: Normal range of motion and neck supple.  ?Skin: ?   General: Skin is warm and dry.  ?   Findings: No rash.  ?Neurological:  ?   Mental Status: He is alert and oriented to person, place, and time.  ?   Cranial Nerves: No cranial nerve deficit.  ?   Sensory: No sensory deficit.  ?   Comments: Reasonable insight and awareness. Functional memory. Normal language and speech. UE 4+/5 prox to distal. LE: 3/5 HF, 4-/5 KE and 4/5 ADF/PF. Normal reflexes. No abnl tone. Normal cerebellar  exam ? ?Assessment/Plan: ?1. Functional deficits due to debility ?Stable for D/C today ?F/u cardiology 1 weeks ?F/u PM&R 2 weeks ?See D/C summary ?See D/C instructions ? ?Care Tool: ? ?Bathing ? Bathing activity did not occur: Safety/medical concerns ?Body parts bathed by patient: Right arm, Left arm, Chest, Abdomen, Front perineal area, Buttocks, Right upper leg, Left upper leg, Right lower leg, Left lower leg, Face  ?   ?  ?  ?Bathing assist Assist Level: Independent with assistive device ?  ?  ?Upper Body Dressing/Undressing ?Upper body dressing   ?What is the patient wearing?: Pull over shirt ?   ?Upper body assist Assist Level: Independent ?   ?Lower Body Dressing/Undressing ?Lower body dressing ? ? ? Lower body dressing activity did not occur: Safety/medical concerns ?What is the patient wearing?: Underwear/pull up, Pants ? ?  ? ?Lower body assist Assist for lower body dressing: Independent with assitive device ?   ? ?Toileting ?Toileting    ?Toileting assist Assist for toileting: Independent ?Assistive Device Comment: walker ?  ?Transfers ?Chair/bed transfer ? ?Transfers assist ?   ? ?  Chair/bed transfer assist level: Independent ?Chair/bed transfer assistive device: Other (none) ?  ?Locomotion ?Ambulation ? ? ?Ambulation assist ? ?   ? ?Assist level: Contact Guard/Touching assist ?Assistive device: No Device ?Max distance: >200'  ? ?Walk 10 feet activity ? ? ?Assist ?   ? ?Assist level: Contact Guard/Touching assist ?Assistive device: No Device  ? ?Walk 50 feet activity ? ? ?Assist   ? ?Assist level: Contact Guard/Touching assist ?Assistive device: No Device  ? ? ?Walk 150 feet activity ? ? ?Assist   ? ?Assist level: Contact Guard/Touching assist ?Assistive device: No Device ?  ? ?Walk 10 feet on uneven surface  ?activity ? ? ?Assist   ? ? ?Assist level: Contact Guard/Touching assist ?Assistive device: Other (comment) (no device)  ? ?Wheelchair ? ? ? ? ?Assist Is the patient using a wheelchair?: No ?  ?  ? ?   ?   ? ? ?Wheelchair 50 feet with 2 turns activity ? ? ? ?Assist ? ?  ?  ? ? ?   ? ?Wheelchair 150 feet activity  ? ? ? ?Assist ?   ? ? ?   ? ?Blood pressure 115/66, pulse (!) 50, temperature 98.5 ?F (36.9 ?C), temperature source Oral, resp. rate 16, height '5\' 1"'$  (1.549 m), weight 55.1 kg, SpO2 97 %. ? ?Medical Problem List and Plan: ?1. Functional deficits secondary to debility after systolic congestive heart failure and other medical complications ?            -patient may shower ?            -ELOS/Goals: 03/15/2021 appreciate cardiology eval ? Continue CIR- PT, OT  ?2.  Antithrombotics: ?-DVT/anticoagulation:  Pharmaceutical: Coumadin-INR therapeutic at 2.6, '4mg'$  warfarin today.  ?            -antiplatelet therapy: N/A ?3. L neck tightness/pain- continue lidocaine patch 8pm to 8am- no heat pack at night- just during day when patch is off- ?  ?4. Mood: LCSW to follow for evaluation and support.  ?            -antipsychotic agents: N/A ?5. Neuropsych: This patient is capable of making decisions on his own behalf. ?6. Skin/Wound Care: Routine pressure relief measures. ?7. Fluids/Electrolytes/Nutrition: Strict I/O. Check CMET in am  ?8. 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   ?            --Wt improved from 132 lbs-->122 (which is average overall) ?            --on Jardiance, Lasix, metoprolol, aldactone and Plavix. No ARB due to CKD ?            -daily weights ? 4/6- no weight- will remind nursing.  ? 4/7- weight down to 54.9 kg- down 0.6 mg again- likely due to being dry- will push fluids ?9: Orthostatic hypotension: Will continue to monitor orthostatic vitals. ?--TEDs for edema control and support.  ?4/5- not out of Bed yet, but will monitor for Sx's. ?4/7- doing better- con't TEDs  ?10. 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 Continue metoprolol and amiodarone. Change lopressor to 12.'5mg'$  BID so canbe crushed.  ?            --coumadin per pharmacy ?12.  AKI: BUN/SCr 29/1.57 at admission likely due to recent PTCA/dyte ?--Was WNL prior to cath 03/16 (past three months per Epic.) ?--SCr stabilizing to 1.3-1.4 range.  ?4/5- Cr 1.4-- in normal  range for pt- con't to monitor ?4/7- Cr up to 1.60 and BUN 34- will recheck Monday and give IVFs- push fluids over weekend ? ?  Latest Ref Rng & Units 03/15/2022  ?  5:39 AM 03/14/2022  ?  7:28 AM 03/11/2022  ?  5:06 AM  ?BMP  ?Glucose 70 - 99 mg/dL 98   137   91    ?BUN 8 - 23 mg/dL 36   34   34    ?Creatinine 0.61 - 1.24 mg/dL 1.60   1.69   1.60    ?Sodium 135 - 145 mmol/L 137   137   137    ?Potassium 3.5 - 5.1 mmol/L 4.3   4.4   4.4    ?Chloride 98 - 111 mmol/L 104   103   104    ?CO2 22 - 32 mmol/L '27   26   24    '$ ?Calcium 8.9 - 10.3 mg/dL 8.7   9.0   9.0    ? Last 2 values trending up, may need nocturnal IVF tonite slow rate, will ask cardiology to re eval meds , just on low dose lasix, spironolactone stopped per cardiology today ?13. GERD: Continue protonix ?14. Iron deficiency anemia: Has been supplemented with Ferric gluconate 250 mg X 2 doses ?--monitor H/H with serial checks. ?4/6- CBC again in AM   ? ?  Latest Ref Rng & Units 03/13/2022  ? 11:57 PM 03/11/2022  ?  5:06 AM 03/09/2022  ?  5:05 AM  ?CBC  ?WBC 4.0 - 10.5 K/uL 6.9   8.8   10.0    ?Hemoglobin 13.0 - 17.0 g/dL 11.2   11.4   11.2    ?Hematocrit 39.0 - 52.0 % 34.1   33.8   33.8    ?Platelets 150 - 400 K/uL 203   223   229    ?  ?15. Constipation ? 4/7- will give sorbitol to get him to have BM- clean out ? ?  ? ?LOS: ?7 days ?A FACE TO FACE EVALUATION WAS PERFORMED ? ?Luanna Salk Lucillie Kiesel ?03/15/2022, 12:39 PM  ? ? ? ?

## 2022-03-15 NOTE — Progress Notes (Signed)
Occupational Therapy Session Note ? ?Patient Details  ?Name: Jesus [name] ?MRN: [id number] ?Date of Birth: [birth year] ? ?Today's Date: 03/15/2022 ?OT Individual Time: 7858-8502 ?OT Individual Time Calculation (min): 60 min  ? ? ?Short Term Goals: ?Week 1:  OT Short Term Goal 1 (Week 1): STG=LTG d/t ELOS ? ?Skilled Therapeutic Interventions/Progress Updates:  ?  Pt seen for BADL retraining of toileting, bathing, and dressing with a focus on pt completing tasks at a independent level. Pt gathered his clothing and belonging he needed for a shower then used the restroom. Pt reminded me he had an IV in his arm that needed to be covered. Covered IV for shower.  ?Pt completed shower with mod I, was careful to dry off his feet to avoid slipping. Dressed in bathroom then ambulated around the room to gather items for grooming. Completed oral care, shaving.  ?Pt then ambulated to laundry to practice placing and retreiving items from washer and dryer with no difficulty.   ?Ambulated to dayroom to work on [employer] at resistance 3 for 5 min. Discussed options of going to a senior fitness center for future exercise. ?Discussed no driving or working until he is cleared by his cardiologist. ?Ambulated back to his room. Pt is now mod I in his room . ?   ? ?Therapy Documentation ?Precautions: fall ?  ?Vital Signs: ?Therapy Vitals ?Temp: 98.5 ?F (36.9 ?C) ?Temp Source: Oral ?Pulse Rate: (!) 50 ?Resp: 16 ?BP: (!) 119/57 ?Patient Position (if appropriate): Lying ?Oxygen Therapy ?SpO2: 97 % ?O2 Device: Room Air ?Pain: ? No c/o pain ? ?Therapy/Group: Individual Therapy ? ?[place] ?03/15/2022, 8:28 AM ?

## 2022-03-15 NOTE — Progress Notes (Signed)
Discussed bradycardia/EKG with Bhagat PA for cardiology. He discussed pt  with Dr. Audie Box who recommended d/c of BB. Patient has follow up next Monday.  ?

## 2022-03-15 NOTE — Progress Notes (Signed)
Inpatient Rehabilitation Discharge Medication Review by a Pharmacist ? ?A complete drug regimen review was completed for this patient to identify any potential clinically significant medication issues. ? ?High Risk Drug Classes Is patient taking? Indication by Medication  ?Antipsychotic No   ?Anticoagulant Yes Coumadin- CAD s/p CABG, PCI to SVG-RCA; AF  ?Antibiotic No   ?Opioid No   ?Antiplatelet Yes Plavix- CAD s/p CABG, PCI to SVG-RCA  ?Hypoglycemics/insulin Yes Empagloflozin- HFrEF  ?Vasoactive Medication Yes lasix prn, toprol, nitroSL- lower extremity edema, rate control, angina  ?Chemotherapy No   ?Other Yes Lipitor- HLD ?Claritin- allergies ?  ? ? ? ?Type of Medication Issue Identified Description of Issue Recommendation(s)  ?Drug Interaction(s) (clinically significant) ?    ?Duplicate Therapy ?    ?Allergy ?    ?No Medication Administration End Date ?    ?Incorrect Dose ?    ?Additional Drug Therapy Needed ?    ?Significant med changes from prior encounter (inform family/care partners about these prior to discharge).    ?Other ? Heart failure meds not reordered at discharge Cardiology consulted on 4/10. States that they will see patient on follow-up in 1 week and determine when to restart spironolactone, amiodarone, ACE/ARB/ARNI  ? ? ?Clinically significant medication issues were identified that warrant physician communication and completion of prescribed/recommended actions by midnight of the next day:  No ? ? ?Time spent performing this drug regimen review (minutes):  30 ? ? ?Argusta Mcgann BS, PharmD, BCPS ?Clinical Pharmacist ?03/15/2022 7:08 AM ? ?Contact: [phone] after 3 PM ? ?"Be curious, not judgmental..." -Jamal Maes ?

## 2022-03-15 NOTE — Telephone Encounter (Signed)
Received a msg that PT called and reported pt has been in PT rehabilitation and he needs his INR checked on 4/17, Monday. ? ?Tried to contact pt to see when he would like to be schedule but no answer and no VM.  ?

## 2022-03-15 NOTE — Discharge Summary (Signed)
Physician Discharge Summary  ?Patient ID: ?Jesus [name] ?MRN: [id number] ?DOB/AGE: 04/02/1941 81 y.o. ? ?Admit date: 03/08/2022 ?Discharge date: 03/15/2022 ? ?Discharge Diagnoses:  ?Principal Problem: ?  Debility ?Active Problems: ?  Essential hypertension ?  Long term (current) use of anticoagulants ?  Chronic systolic CHF (congestive heart failure) ([place]) ?  S/P angioplasty with stent 02/17/22 DES to proximal VG to PDA ?  Acute kidney injury superimposed on chronic kidney disease ([place]) ?  Bradycardia ? ? ?Discharged Condition: stable ? ?Significant Diagnostic Studies: N/A ? ? ?Labs:  ?Basic Metabolic Panel: ?Recent Labs  ?Lab 03/09/22 ?0505 03/11/22 ?1287 03/14/22 ?8676 03/15/22 ?7209  ?NA 135 137 137 137  ?K 4.3 4.4 4.4 4.3  ?CL 104 104 103 104  ?CO2 '24 24 26 27  '$ ?GLUCOSE 96 91 137* 98  ?BUN 28* 34* 34* 36*  ?CREATININE 1.40* 1.60* 1.69* 1.60*  ?CALCIUM 8.9 9.0 9.0 8.7*  ? ? ?CBC: ?Recent Labs  ?Lab 03/09/22 ?0505 03/11/22 ?0506 03/13/22 ?4709  ?WBC 10.0 8.8 6.9  ?NEUTROABS 7.1  --   --   ?HGB 11.2* 11.4* 11.2*  ?HCT 33.8* 33.8* 34.1*  ?MCV 99.1 98.3 98.3  ?PLT 229 223 203  ? ? ?INR:  ?Lab Results  ?Component Value Date  ? INR 2.6 (H) 03/14/2022  ? INR 2.5 (H) 03/13/2022  ? INR 2.6 (H) 03/12/2022  ? PROTIME 18.7 05/06/2009  ?  ?CBG: ?No results for input(s): GLUCAP in the last 168 hours. ? ?Brief HPI:   Jesus [name] is a 81 y.o. male with history of PAF-on coumadin, HTN, TVAR, CAD s/p CABG and recent NSTEMI s/p PTCA/.  He was readmitted on 02/27/22 with progressive SOB, bilateral pleural effusion and peripheral edema due to acute on chronic systolic CHF. He was treated with IV diuresis with improvement in symptoms but did develop orthostatic hypotension due to acute on chronic renal failure.  Amlodipine and losartan were discontinued and diuretic was placed on hold.  Iron deficiency anemia was treated with IV supplementation.  As orthostatic symptoms were resolving with upward trending BP, Lasix was resumed and  decreased to 20 mg a day.  Respiratory status was improving, dizziness had resolved but he continued to be limited by weakness and balance deficits.  CIR was recommended due to functional decline. ? ? ?Hospital Course: Jesus [name] was admitted to rehab 03/08/2022 for inpatient therapies to consist of PT, ST and OT at least three hours five days a week. Past admission physiatrist, therapy team and rehab RN have worked together to provide customized collaborative inpatient rehab.  His blood pressures were monitored on TID basis and has been stable.  Metoprolol was decreased to 12.5 mg due to bradycardia.  Follow-up CBC showed H&H to be stable.  Pharmacy has been assisting in dosing and managing Coumadin.  INR is stable and therapeutic on 4 mg daily and recommend repeat protime check on 04/17.  ? ?Routine checkup labs showed a recurrent acute on chronic renal failure with downward trending weight to 117 pounds.  Cardiology was consulted to evaluate medication and felt that he was euvolemic.  He was treated with IVF overnight, Aldactone was discontinued, Lasix was changed to PRN and beta-blocker was changed to metoprolol succinate 25 mg a day.  He did develop recurrent asymtomatic bradycardia on this dose therefore metoprolol was discontinued per cardiology.  Patient has made steady gains during his rehab stay and is modified independent in supervised setting.  He will continue to receive follow-up home  health PT and [facility] by [facility] after discharge. ? ? ?Rehab course: During patient's stay in rehab brief team conference was held to monitor patient's progress, set goals and discuss barriers to discharge. At admission, patient required min assist with ADLs and contact-guard assist with mobility. He has had improvement in activity tolerance, balance, postural control as well as ability to compensate for deficits.  He is able to complete ADL tasks at modified independent level.  He is modified  independent for transfers and to ambulate 300 feet with straight cane.  He is able to climb 4 stairs with 2 rails at modified independent level.  Family was advised to provide intermittent supervision after discharge. ? ?Disposition: Home ? ?Diet: Heart Healthy ? ?Special Instructions: ?No driving or strenuous activity till cleared by MD. ?2. Increase fluid intake. Weight today and daily ?3. Recommend repeat INR on 04/17 to monitor for stability.  ? ? ?Allergies as of 03/15/2022   ? ?   Reactions  ? Amlodipine Other (See Comments)  ? Swelling?  ? Codeine Other (See Comments)  ? Pt does not remember  ? Iodine Swelling  ? ?  ? ?  ?Medication List  ?  ? ?STOP taking these medications   ? ?amoxicillin 400 MG/5ML suspension ?Commonly known as: AMOXIL ?  ?aspirin 81 MG tablet ?  ?metoprolol succinate 25 MG 24 hr tablet ?Commonly known as: TOPROL-XL ?  ?spironolactone 25 MG tablet ?Commonly known as: ALDACTONE ?  ? ?  ? ?TAKE these medications   ? ?acetaminophen 325 MG tablet ?Commonly known as: TYLENOL ?Take 1-2 tablets (325-650 mg total) by mouth every 4 (four) hours as needed for mild pain. ?  ?albuterol 108 (90 Base) MCG/ACT inhaler ?Commonly known as: VENTOLIN HFA ?INHALE 2 PUFFS BY MOUTH EVERY 4 HOURS ASNEEDED FOR WHEEZING OR SHORTNESS OF BREATH. ?What changed: See the new instructions. ?  ?amiodarone 200 MG tablet ?Commonly known as: PACERONE ?Take 1 tablet (200 mg total) by mouth daily. ?What changed: how much to take ?  ?atorvastatin 80 MG tablet ?Commonly known as: LIPITOR ?Take 1 tablet (80 mg total) by mouth daily. ?Notes to patient: Start tomorrow ?  ?clopidogrel 75 MG tablet ?Commonly known as: PLAVIX ?Take 1 tablet (75 mg total) by mouth daily with breakfast. ?  ?diclofenac Sodium 1 % Gel ?Commonly known as: VOLTAREN ?Apply 4 g topically 4 (four) times daily. ?Notes to patient: For shoulder pain (can applied to three different joints if needed) ?  ?empagliflozin 10 MG Tabs tablet ?Commonly known as:  JARDIANCE ?Take 1 tablet (10 mg total) by mouth daily. ?  ?furosemide 20 MG tablet ?Commonly known as: LASIX ?Take 1 tablet (20 mg total) by mouth daily as needed. For swelling in the legs, shortness of breath or if your weight goes up by 2 lbs overnight. Call cardiology for input. ?What changed:  ?when to take this ?reasons to take this ?additional instructions ?  ?lidocaine 5 % ?Commonly known as: LIDODERM ?Place 1 patch onto the skin daily. Apply at 8 pm and remove at 8 am daily--to neck/shoulder for pain. ?Notes to patient: You [name] need to purchase this over the counter as insurance usually does not pay for this.  ?  ?loratadine 10 MG tablet ?Commonly known as: CLARITIN ?Take 10 mg by mouth daily as needed for allergies. ?  ?melatonin 5 MG Tabs ?Take 1 tablet (5 mg total) by mouth at bedtime as needed (sleep). ?  ?methocarbamol 500 MG tablet ?  Commonly known as: ROBAXIN ?Take 1 tablet (500 mg total) by mouth every 6 (six) hours as needed for muscle spasms. ?  ?multivitamin,tx-minerals tablet ?Take 1 tablet by mouth daily. ?  ?nitroGLYCERIN 0.4 MG SL tablet ?Commonly known as: NITROSTAT ?Place 1 tablet (0.4 mg total) under the tongue every 5 (five) minutes x 3 doses as needed for chest pain. ?What changed: when to take this ?  ?vitamin C 1000 MG tablet ?Take 1,000 mg by mouth daily. ?  ?warfarin 4 MG tablet ?Commonly known as: COUMADIN ?Take as directed. If you are unsure how to take this medication, talk to your nurse or doctor. ?Original instructions: Take 1 tablet (4 mg total) by mouth every evening. ?What changed: See the new instructions. ?  ? ?  ? ? Follow-up Information   ? ? Courtney Heys, MD. Call.   ?Specialty: Physical Medicine and Rehabilitation ?Why: As needed ?Contact information: ?[address] ?[name] [address] ?[phone] ? ? ?  ?  ? ? Belva Crome, MD Follow up.   ?Specialty: Cardiology ?Why: call for follow up appointment ?Contact information: ?[address] ?[address] ?[place] [address] ?[phone] ? ? ?  ?  ? ? [name], MD Follow up.   ?Specialty: Family Medicine ?Why: call for follow up appointment. Coumadin clinic will call you with time for blood draw. ?[place]

## 2022-03-15 NOTE — Progress Notes (Signed)
Per PA Olin Hauser, pt is cleared for discharge. ?PA discussed instructions with pt/family and they are in agreement. Pt belongings gathered. IV removed. Pt left per wheelchair to private vehicle.  ?Sheela Stack, LPN  ?

## 2022-03-15 NOTE — Progress Notes (Signed)
Patient ID: Jesus [name], male   DOB: [birth year], 81 y.o.   MRN: [id number] MD feels pt is medically stable for discharge today. Pt happy to be going home ?

## 2022-03-15 NOTE — Telephone Encounter (Signed)
Katie from [facility] called and needed to know the directions for Lidocaine patches. Informed her that it needs to be applied at 8 pm and removed at 8 am ?

## 2022-03-15 NOTE — Progress Notes (Addendum)
Clarified metoprolol dosage before pt discharges today. PA Olin Hauser notified of trending heart rates <50. EKG obtained.No complications noted with pt. ? No new orders at this time. Family present at bedside. Updated on situation. No further questions from pt/family. Sheela Stack, LPN  ? ?Pt waiting on discharge ?

## 2022-03-16 ENCOUNTER — Telehealth: Payer: Self-pay

## 2022-03-16 DIAGNOSIS — Z7984 Long term (current) use of oral hypoglycemic drugs: Secondary | ICD-10-CM | POA: Diagnosis not present

## 2022-03-16 DIAGNOSIS — I4821 Permanent atrial fibrillation: Secondary | ICD-10-CM | POA: Diagnosis not present

## 2022-03-16 DIAGNOSIS — R001 Bradycardia, unspecified: Secondary | ICD-10-CM | POA: Diagnosis not present

## 2022-03-16 DIAGNOSIS — I480 Paroxysmal atrial fibrillation: Secondary | ICD-10-CM | POA: Diagnosis not present

## 2022-03-16 DIAGNOSIS — I214 Non-ST elevation (NSTEMI) myocardial infarction: Secondary | ICD-10-CM | POA: Diagnosis not present

## 2022-03-16 DIAGNOSIS — I429 Cardiomyopathy, unspecified: Secondary | ICD-10-CM | POA: Diagnosis not present

## 2022-03-16 DIAGNOSIS — E7849 Other hyperlipidemia: Secondary | ICD-10-CM | POA: Diagnosis not present

## 2022-03-16 DIAGNOSIS — K219 Gastro-esophageal reflux disease without esophagitis: Secondary | ICD-10-CM | POA: Diagnosis not present

## 2022-03-16 DIAGNOSIS — Z7902 Long term (current) use of antithrombotics/antiplatelets: Secondary | ICD-10-CM | POA: Diagnosis not present

## 2022-03-16 DIAGNOSIS — I2510 Atherosclerotic heart disease of native coronary artery without angina pectoris: Secondary | ICD-10-CM | POA: Diagnosis not present

## 2022-03-16 DIAGNOSIS — D509 Iron deficiency anemia, unspecified: Secondary | ICD-10-CM | POA: Diagnosis not present

## 2022-03-16 DIAGNOSIS — I951 Orthostatic hypotension: Secondary | ICD-10-CM | POA: Diagnosis not present

## 2022-03-16 DIAGNOSIS — N189 Chronic kidney disease, unspecified: Secondary | ICD-10-CM | POA: Diagnosis not present

## 2022-03-16 DIAGNOSIS — I130 Hypertensive heart and chronic kidney disease with heart failure and stage 1 through stage 4 chronic kidney disease, or unspecified chronic kidney disease: Secondary | ICD-10-CM | POA: Diagnosis not present

## 2022-03-16 DIAGNOSIS — I5023 Acute on chronic systolic (congestive) heart failure: Secondary | ICD-10-CM | POA: Diagnosis not present

## 2022-03-16 DIAGNOSIS — I350 Nonrheumatic aortic (valve) stenosis: Secondary | ICD-10-CM | POA: Diagnosis not present

## 2022-03-16 NOTE — Telephone Encounter (Signed)
Contacted pt and his niece, Rodena Piety, and scheduled pt for coumadin clinic at [facility] for 4/24 when he also has apt with PCP. Pt reports he is sore but is home and doing well. Advised if anything is needed before next apt to contact coumadin clinic or PCP office. Pt and his niece verbalized understanding. ?

## 2022-03-16 NOTE — Telephone Encounter (Signed)
Transition Care Management Follow-up Telephone Call ?Date of discharge and from where: [facility] 03-15-22 Dx: Debility ?How have you been since you were released from the hospital? Doing good ?Any questions or concerns? No ? ?Items Reviewed: ?Did the pt receive and understand the discharge instructions provided? Yes  ?Medications obtained and verified? Yes  ?Other? No  ?Any new allergies since your discharge? No  ?Dietary orders reviewed? Yes ?Do you have support at home? yes ? ?Home Care and Equipment/Supplies: ?Were home health services ordered? yes ?If so, what is the name of the agency? [facility]  ?Has the agency set up a time to come to the patient's home? yes ?Were any new equipment or medical supplies ordered?  cane ?What is the name of the medical supply agency? Facility  ?Were you able to get the supplies/equipment? yes ?Do you have any questions related to the use of the equipment or supplies? No ? ?Functional Questionnaire: (I = Independent and D = Dependent) ?ADLs: I ? ?Bathing/Dressing- I ? ?Meal Prep- I ? ?Eating- I ? ?Maintaining continence- I ? ?Transferring/Ambulation- I ? ?Managing Meds- I ? ?Follow up appointments reviewed: ? ?PCP Hospital f/u appt confirmed? Yes  Scheduled to see Dr Elease Hashimoto on 03-28-22 @ 2pm. ?[facility] f/u appt confirmed? Yes  Scheduled to see Dr Curly Shores on 03-24-22 @ 1120am. ?Are transportation arrangements needed? No  ?If their condition worsens, is the pt aware to call PCP or go to the Emergency Dept.? Yes ?Was the patient provided with contact information for the PCP's office or ED? Yes ?Was to pt encouraged to call back with questions or concerns? Yes  ?

## 2022-03-18 DIAGNOSIS — Z7902 Long term (current) use of antithrombotics/antiplatelets: Secondary | ICD-10-CM | POA: Diagnosis not present

## 2022-03-18 DIAGNOSIS — E7849 Other hyperlipidemia: Secondary | ICD-10-CM | POA: Diagnosis not present

## 2022-03-18 DIAGNOSIS — I4821 Permanent atrial fibrillation: Secondary | ICD-10-CM | POA: Diagnosis not present

## 2022-03-18 DIAGNOSIS — K219 Gastro-esophageal reflux disease without esophagitis: Secondary | ICD-10-CM | POA: Diagnosis not present

## 2022-03-18 DIAGNOSIS — I429 Cardiomyopathy, unspecified: Secondary | ICD-10-CM | POA: Diagnosis not present

## 2022-03-18 DIAGNOSIS — I214 Non-ST elevation (NSTEMI) myocardial infarction: Secondary | ICD-10-CM | POA: Diagnosis not present

## 2022-03-18 DIAGNOSIS — I2510 Atherosclerotic heart disease of native coronary artery without angina pectoris: Secondary | ICD-10-CM | POA: Diagnosis not present

## 2022-03-18 DIAGNOSIS — R001 Bradycardia, unspecified: Secondary | ICD-10-CM | POA: Diagnosis not present

## 2022-03-18 DIAGNOSIS — I5023 Acute on chronic systolic (congestive) heart failure: Secondary | ICD-10-CM | POA: Diagnosis not present

## 2022-03-18 DIAGNOSIS — D509 Iron deficiency anemia, unspecified: Secondary | ICD-10-CM | POA: Diagnosis not present

## 2022-03-18 DIAGNOSIS — N189 Chronic kidney disease, unspecified: Secondary | ICD-10-CM | POA: Diagnosis not present

## 2022-03-18 DIAGNOSIS — I350 Nonrheumatic aortic (valve) stenosis: Secondary | ICD-10-CM | POA: Diagnosis not present

## 2022-03-18 DIAGNOSIS — Z7984 Long term (current) use of oral hypoglycemic drugs: Secondary | ICD-10-CM | POA: Diagnosis not present

## 2022-03-18 DIAGNOSIS — I480 Paroxysmal atrial fibrillation: Secondary | ICD-10-CM | POA: Diagnosis not present

## 2022-03-18 DIAGNOSIS — I951 Orthostatic hypotension: Secondary | ICD-10-CM | POA: Diagnosis not present

## 2022-03-18 DIAGNOSIS — I130 Hypertensive heart and chronic kidney disease with heart failure and stage 1 through stage 4 chronic kidney disease, or unspecified chronic kidney disease: Secondary | ICD-10-CM | POA: Diagnosis not present

## 2022-03-21 ENCOUNTER — Ambulatory Visit (HOSPITAL_COMMUNITY)
Admit: 2022-03-21 | Discharge: 2022-03-21 | Disposition: A | Discharge status: Home / Self Care | Payer: Medicare Other | Attending: Cardiology | Admitting: Cardiology

## 2022-03-21 ENCOUNTER — Encounter (HOSPITAL_COMMUNITY): Payer: Self-pay

## 2022-03-21 ENCOUNTER — Other Ambulatory Visit: Payer: Self-pay | Admitting: Physical Medicine and Rehabilitation

## 2022-03-21 ENCOUNTER — Telehealth: Payer: Self-pay | Admitting: Pharmacist

## 2022-03-21 ENCOUNTER — Telehealth (HOSPITAL_COMMUNITY): Payer: Self-pay | Admitting: *Deleted

## 2022-03-21 VITALS — BP 140/58 | HR 58 | Wt 124.0 lb

## 2022-03-21 DIAGNOSIS — I5022 Chronic systolic (congestive) heart failure: Secondary | ICD-10-CM | POA: Insufficient documentation

## 2022-03-21 DIAGNOSIS — I5023 Acute on chronic systolic (congestive) heart failure: Secondary | ICD-10-CM

## 2022-03-21 DIAGNOSIS — Z7901 Long term (current) use of anticoagulants: Secondary | ICD-10-CM | POA: Insufficient documentation

## 2022-03-21 DIAGNOSIS — Z7984 Long term (current) use of oral hypoglycemic drugs: Secondary | ICD-10-CM | POA: Diagnosis not present

## 2022-03-21 DIAGNOSIS — E785 Hyperlipidemia, unspecified: Secondary | ICD-10-CM | POA: Diagnosis not present

## 2022-03-21 DIAGNOSIS — N179 Acute kidney failure, unspecified: Secondary | ICD-10-CM | POA: Insufficient documentation

## 2022-03-21 DIAGNOSIS — I25810 Atherosclerosis of coronary artery bypass graft(s) without angina pectoris: Secondary | ICD-10-CM | POA: Insufficient documentation

## 2022-03-21 DIAGNOSIS — Z8774 Personal history of (corrected) congenital malformations of heart and circulatory system: Secondary | ICD-10-CM | POA: Diagnosis not present

## 2022-03-21 DIAGNOSIS — I252 Old myocardial infarction: Secondary | ICD-10-CM | POA: Diagnosis not present

## 2022-03-21 DIAGNOSIS — Z952 Presence of prosthetic heart valve: Secondary | ICD-10-CM | POA: Diagnosis not present

## 2022-03-21 DIAGNOSIS — I482 Chronic atrial fibrillation, unspecified: Secondary | ICD-10-CM | POA: Insufficient documentation

## 2022-03-21 DIAGNOSIS — I110 Hypertensive heart disease with heart failure: Secondary | ICD-10-CM | POA: Insufficient documentation

## 2022-03-21 DIAGNOSIS — I2510 Atherosclerotic heart disease of native coronary artery without angina pectoris: Secondary | ICD-10-CM | POA: Insufficient documentation

## 2022-03-21 LAB — BASIC METABOLIC PANEL
Anion gap: 8 (ref 5–15)
BUN: 28 mg/dL — ABNORMAL HIGH (ref 8–23)
CO2: 26 mmol/L (ref 22–32)
Calcium: 8.7 mg/dL — ABNORMAL LOW (ref 8.9–10.3)
Chloride: 103 mmol/L (ref 98–111)
Creatinine, Ser: 1.54 mg/dL — ABNORMAL HIGH (ref 0.61–1.24)
GFR, Estimated: 44 mL/min — ABNORMAL LOW (ref >60)
Glucose, Bld: 104 mg/dL — ABNORMAL HIGH (ref 70–99)
Potassium: 5.1 mmol/L (ref 3.5–5.1)
Sodium: 137 mmol/L (ref 135–145)

## 2022-03-21 LAB — BRAIN NATRIURETIC PEPTIDE: B Natriuretic Peptide: 372.2 pg/mL — ABNORMAL HIGH (ref 0.0–100.0)

## 2022-03-21 NOTE — Progress Notes (Signed)
ReDS Vest / Clip - 03/21/22 1500   ? ?  ? ReDS Vest / Clip  ? Station Marker A   ? Ruler Value 29   ? ReDS Value Range Low volume   ? ReDS Actual Value 29   ? ?  ?  ? ?  ? ? ?

## 2022-03-21 NOTE — Patient Instructions (Addendum)
Thank you for coming in today ? ?Labs were done today, if any labs are abnormal the clinic will call you ?No news is good news  ? ? ?PLEASE CALL THE CARDIOLOGY OFFICE [phone] to reschedule your appointment ? ? ?At the [facility], you and your health needs are our priority. As part of our continuing mission to provide you with exceptional heart care, we have created designated Provider Care Teams. These Care Teams include your primary Cardiologist (physician) and Advanced Practice Providers (APPs- Physician Assistants and Nurse Practitioners) who all work together to provide you with the care you need, when you need it.  ? ?You may see any of the following providers on your designated Care Team at your next follow up: ?Dr Glori Bickers ?Dr Loralie Champagne ?Darrick Grinder, NP ?Lyda Jester, PA ?Jessica Milford,NP ?Marlyce Huge, PA ?Audry Riles, PharmD ? ? ?Please be sure to bring in all your medications bottles to every appointment.  ? ?If you have any questions or concerns before your next appointment please send [place] a message through [place] or call our office at [phone].   ? ?TO LEAVE A MESSAGE FOR THE NURSE SELECT OPTION 2, PLEASE LEAVE A MESSAGE INCLUDING: ?YOUR NAME ?DATE OF BIRTH ?CALL BACK NUMBER ?REASON FOR CALL**this is important as we prioritize the call backs ? ?YOU WILL RECEIVE A CALL BACK THE SAME DAY AS LONG AS YOU CALL BEFORE 4:00 PM ? ? ?

## 2022-03-21 NOTE — Chronic Care Management (AMB) (Signed)
? ? ?Chronic Care Management ?Pharmacy Assistant  ? ?Name: Jesus [name]  MRN: [id number] DOB: [birth year] ? ?Reason for Encounter: Disease State / Hypertension Assessment Call ?  ?Conditions to be addressed/monitored: ?HTN ? ?Recent office visits:  ?02/23/2022 [name] MD - Patient was seen for acute kidney injury and additional issues. No medication changes. No follow up noted.  ? ?Recent consult visits:  ?12/02/2021 Cathlean Cower - Patient was seen for long term use of anticoagulants. No other chart notes.  ? ?Hospital visits:  ?Admitted to [facility] on 03/08/2022 due to debility and additional issues. Discharge date was 03/15/2022.  ?New?Medications Started at [facility] Discharge:?? ?acetaminophen (TYLENOL) ?lidocaine (LIDODERM) ?melatonin ?methocarbamol (ROBAXIN) ?Medication Changes at Hospital Discharge: ?amiodarone ([place]) ?furosemide (LASIX) ?warfarin (COUMADIN) ?Medications Discontinued at Hospital Discharge: ?amoxicillin 400 MG/5ML suspension (AMOXIL) ?aspirin 81 MG tablet ?metoprolol succinate 25 MG 24 hr tablet (TOPROL-XL) ?spironolactone 25 MG tablet (ALDACTONE) ?Medications that remain the same after Hospital Discharge:??  ?-All other medications will remain the same.   ? ? ?Admitted to [facility] on 02/27/2022 due to Acute on chronic systolic congestive hear failure. Discharge date was 03/08/2022.    ?New?Medications Started at [facility] Discharge:?? ?empagliflozin (JARDIANCE) ?furosemide (LASIX) Start taking on: March 09, 2022 ?metoprolol succinate (TOPROL-XL) ?spironolactone (ALDACTONE) ?Medication Changes at Hospital Discharge: ?amiodarone ([place]) ?Medications Discontinued at Hospital Discharge: ?amLODipine 10 MG tablet (NORVASC) ?losartan 50 MG tablet (COZAAR) ?metoprolol tartrate 25 MG tablet (LOPRESSOR) ?Medications that remain the same after Hospital Discharge:??  ?-All other medications will remain the same.   ? ? ?Patient was seen at [facility] ED on 02/24/2022 (3 hrs) due to left foot pain.  ?New?Medications Started at [facility] Discharge:?? ?No medication started ?Medication Changes at Hospital Discharge: ?No medication changes ?Medications Discontinued at Hospital Discharge: ?No medications discontinued ?Medications that remain the same after Hospital Discharge:??  ?-All other medications will remain the same.   ? ? ?Admitted to [facility] on 02/15/2022 due to non ST elevated myocardial infarction. Discharge date was 02/20/2022.    ?New?Medications Started at [facility] Discharge:?? ?clopidogrel (PLAVIX) Start taking on: February 21, 2022 ?metoprolol tartrate (LOPRESSOR) ?nitroGLYCERIN (NITROSTAT) ?Medication Changes at Hospital Discharge: ?amiodarone ([place]) ?amLODipine (NORVASC) ?atorvastatin (LIPITOR) ?losartan (COZAAR) ?Medications Discontinued at Hospital Discharge: ?No medications discontinued ?Medications that remain the same after Hospital Discharge:??  ?-All other medications will remain the same.   ? ? ?Patient was seen at [facility] on 01/10/2022 due to palpitations and additional issue.  ?New?Medications Started at [facility] Discharge:?? ?No medication started ?Medication Changes at Hospital Discharge: ?No medication changes ?Medications Discontinued at Hospital Discharge: ?No medications discontinued ?Medications that remain the same after Hospital Discharge:??  ?-All other medications will remain the same.   ?Medications: ?Outpatient Encounter Medications as of 03/21/2022  ?Medication Sig  ? acetaminophen (TYLENOL) 325 MG tablet Take 1-2 tablets (325-650 mg total) by mouth every 4 (four) hours as needed for mild pain.  ? albuterol (VENTOLIN HFA) 108 (90 Base) MCG/ACT inhaler INHALE 2 PUFFS BY MOUTH EVERY 4 HOURS ASNEEDED FOR WHEEZING OR SHORTNESS OF BREATH. (Patient taking differently: 2 puffs every 4 (four) hours as needed for wheezing or shortness of breath.)  ? amiodarone (PACERONE) 200 MG  tablet Take 1 tablet (200 mg total) by mouth daily.  ? Ascorbic Acid (VITAMIN C) 1000 MG tablet Take 1,000 mg by mouth daily.  ? atorvastatin (LIPITOR) 80 MG tablet Take 1 tablet (80 mg total) by mouth daily.  ?  clopidogrel (PLAVIX) 75 MG tablet Take 1 tablet (75 mg total) by mouth daily with breakfast.  ? diclofenac Sodium (VOLTAREN) 1 % GEL Apply 4 g topically 4 (four) times daily.  ? empagliflozin (JARDIANCE) 10 MG TABS tablet Take 1 tablet (10 mg total) by mouth daily.  ? furosemide (LASIX) 20 MG tablet Take 1 tablet (20 mg total) by mouth daily as needed. For swelling in the legs, shortness of breath or if your weight goes up by 2 lbs overnight. Call cardiology for input.  ? lidocaine (LIDODERM) 5 % Place 1 patch onto the skin daily. Apply at 8 pm and remove at 8 am daily--to neck/shoulder for pain.  ? loratadine (CLARITIN) 10 MG tablet Take 10 mg by mouth daily as needed for allergies.  ? melatonin 5 MG TABS Take 1 tablet (5 mg total) by mouth at bedtime as needed (sleep).  ? methocarbamol (ROBAXIN) 500 MG tablet TAKE 1 TABLET BY MOUTH EVERY 6 HOURS AS NEEDED FOR MUSCLE SPASMS  ? Multiple Vitamins-Minerals (MULTIVITAMIN,TX-MINERALS) tablet Take 1 tablet by mouth daily.  ? nitroGLYCERIN (NITROSTAT) 0.4 MG SL tablet Place 1 tablet (0.4 mg total) under the tongue every 5 (five) minutes x 3 doses as needed for chest pain. (Patient taking differently: Place 0.4 mg under the tongue every 5 (five) minutes as needed for chest pain.)  ? warfarin (COUMADIN) 4 MG tablet Take 1 tablet (4 mg total) by mouth every evening.  ? ?No facility-administered encounter medications on file as of 03/21/2022.  ?Fill History: ?AMIODARONE HCL 200 MG TABLET 02/23/2022 40  ? ?ATORVASTATIN CALCIUM  80 MG TABS 03/19/2022 30  ? ?CLOPIDOGREL  75 MG TABS 03/15/2022 30  ? ?JARDIANCE  10 MG TABS 03/15/2022 30  ? ?FUROSEMIDE  20 MG TABS 03/15/2022 30  ? ?LOSARTAN POTASSIUM  50 MG TABS 03/19/2022 30  ? ?METHOCARBAMOL  500 MG TABS 03/15/2022 8   ? ?METOPROLOL TARTRATE  25 MG TABS 03/19/2022 30  ? ?NITROGLYCERIN 0.4 MG TABLET SL 02/20/2022 5  ? ?WARFARIN SODIUM  4 MG TABS 01/10/2022 100  ? ?AMLODIPINE BESYLATE  10 MG TABS 03/19/2022 30  ? ?Reviewed chart prior to disease state call. Spoke with patient regarding BP ? ?Recent Office Vitals: ?BP Readings from Last 3 Encounters:  ?03/21/22 (!) 140/58  ?03/15/22 115/66  ?03/08/22 (!) 126/45  ? ?Pulse Readings from Last 3 Encounters:  ?03/21/22 (!) 58  ?03/15/22 (!) 50  ?03/08/22 65  ?  ?Wt Readings from Last 3 Encounters:  ?03/21/22 124 lb (56.2 kg)  ?03/15/22 121 lb 7.6 oz (55.1 kg)  ?03/08/22 122 lb 12.7 oz (55.7 kg)  ?  ? ?Kidney Function ?Lab Results  ?Component Value Date/Time  ? CREATININE 1.60 (H) 03/15/2022 05:39 AM  ? CREATININE 1.69 (H) 03/14/2022 07:28 AM  ? CREATININE 0.91 09/12/2016 01:11 PM  ? CREATININE 0.88 03/11/2016 07:34 AM  ? GFR 47.03 (L) 02/23/2022 04:07 PM  ? GFRNONAA 42 (L) 03/15/2022 05:39 AM  ? GFRAA 90 11/04/2020 03:11 PM  ? ? ? ?  Latest Ref Rng & Units 03/15/2022  ?  5:39 AM 03/14/2022  ?  7:28 AM 03/11/2022  ?  5:06 AM  ?BMP  ?Glucose 70 - 99 mg/dL 98   137   91    ?BUN 8 - 23 mg/dL 36   34   34    ?Creatinine 0.61 - 1.24 mg/dL 1.60   1.69   1.60    ?Sodium 135 - 145 mmol/L 137   137  137    ?Potassium 3.5 - 5.1 mmol/L 4.3   4.4   4.4    ?Chloride 98 - 111 mmol/L 104   103   104    ?CO2 22 - 32 mmol/L '27   26   24    '$ ?Calcium 8.9 - 10.3 mg/dL 8.7   9.0   9.0    ? ? ?Current antihypertensive regimen:  ?No longer on a blood pressure medication ? ?How often are you checking your Blood Pressure? Patient has not been checking blood pressures since hospital stays.  Patient plans to find his blood pressure cuff and will start checking. He plans to take his monitor to his appointment with Dr. Elease Hashimoto for comparison.  ? ?Current home BP readings: Patient hasn't been checking.  ? ?What recent interventions/DTPs have been made by any provider to improve Blood Pressure control since last CPP  Visit: Losartan 50 mg was filled Cecilie Kicks NP on 03/19/2022, this is not on patients med list.  ? ?Note: Patient states he does not have Losartan, Furosemide or Amiodarone on his current list of medications that

## 2022-03-21 NOTE — Progress Notes (Signed)
? ? ?HEART & VASCULAR TRANSITION OF CARE CONSULT NOTE  ? ? ? ?Referring Physician: Dr. Audie Box  ?Primary Care: [name], MD  ?Primary Cardiologist: [name], MD ? ? ?HPI: ?Referred to clinic by Dr. Audie Box for heart failure consultation.  ? 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 Echo showed drop in EF down to 35-40%, RV normal, AV prothesis w/ peri valvular leak (chronic/stable). LHC showed the following: ? 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 There is a severe stenosis in the proximal body of the SVG to the PDA.  ? ?He underwent successful PTCA/DES x 1 proximal body of SVG to PDA using a distal protection device. Recs were  to continue ASA/Plavix until he is therapeutic on coumadin and can then consider stopping ASA while continuing Plavix with coumadin.  ? ?He was placed on GDMT for systolic heart failure and discharged to CIR for rehab. While at [facility], cardiology was re consulted for med recommendation after bump in SCr from 1.3>>1.7. He was felt to be over diuresed. Aldactone was discontinued and lasix switched from daily to PRN.  ASA was discontinued as INR had reached therapeutic range. Referred to [facility] clinic.  ? ?Presents today for assessment. Here w/ his sister-in law. Reports doing well. Denies chest pain. No further exertional dyspnea. Wt has been stable at home since d/c and down 5 lb. ReDs Clip 29 lb. SBP mildly elevated, 140/58,  but typically in the 130s. He does report some positional dizziness getting out of bed but no syncope/ near syncope. Reports full med compliance. Denies abnormal bleeding w/ Plavix and coumadin.  ? ? ? ?Cardiac Testing  ?2D Echo 02/16/22 ?left ventricular ejection fraction, by estimation, is 35 to 40%. The left ventricle has ?moderately decreased function. The left ventricle demonstrates regional wall motion ?abnormalities (see scoring diagram/findings for description). Inferior/inferolateral ?akinesis. Left ventricular diastolic parameters are consistent with Grade II diastolic ?dysfunction (pseudonormalization). Elevated left atrial pressure. ?1. ?Right ventricular systolic function is normal. The right ventricular size is mildly ?enlarged. ?2. ?3. Left atrial size was severely dilated. ?The mitral valve is normal in structure. Trivial mitral valve regurgitation. No evidence ?of mitral stenosis. ?4. ?The aortic valve has been repaired/replaced. Aortic valve regurgitation is mild. There ?is a 26 mm Edwards Sapien prosthetic (TAVR) valve present in the aortic position. ?Procedure Date: 03/10/15. Echo findings are consistent with perivalvular leak of the ?aortic prosthesis. Vmax 2.9 m/s, MG 36mHg (unchanged from prior echo), EOA 1.4 ?cm^2, DI 0.46 ?5. ?The inferior vena cava is normal in size with greater than 50% respiratory variability, ?suggesting right atrial pressure of 3 mmHg. ? ?02/17/22 ?  Ost RCA to Prox RCA lesion is 100% stenosed. ?  Prox Graft lesion before RV Branch  is 80% stenosed. ?  RPAV lesion is 100% stenosed. ?  Ost LM to Mid LM lesion is 100% stenosed. ?  3rd Mrg lesion is 80% stenosed. ?  A drug-eluting stent was successfully placed using a STENT ONYX FRONTIER 3.5X12. ?  Post intervention, there is a 10% residual stenosis. ?  SVG graft was visualized by angiography. ?  SVG graft was visualized by angiography. ?  LIMA graft was visualized by angiography. ?  ?Severe native vessel CAD s/p 6V CABG with  6/6 patent grafts.  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 There is a severe stenosis in the proximal body of the SVG to the PDA.  ?Successful PTCA/DES x 1 proximal body of SVG to PDA using a distal protection device. ? ? ?Review of Systems: [y] = yes, '[ ]'$  = no  ? ?General: Weight gain '[ ]'$ ; Weight loss '[ ]'$ ; Anorexia '[ ]'$ ; Fatigue '[ ]'$ ; Fever '[ ]'$ ; Chills '[ ]'$ ; Weakness '[ ]'$   ?Cardiac: Chest pain/pressure '[ ]'$ ; Resting SOB '[ ]'$ ; Exertional SOB '[ ]'$ ; Orthopnea '[ ]'$ ; Pedal Edema '[ ]'$ ; Palpitations '[ ]'$ ; Syncope '[ ]'$ ; Presyncope '[ ]'$ ; Paroxysmal nocturnal dyspnea'[ ]'$   ?Pulmonary: Cough '[ ]'$ ; Wheezing'[ ]'$ ; Hemoptysis'[ ]'$ ; Sputum '[ ]'$ ; Snoring '[ ]'$   ?GI: Vomiting'[ ]'$ ; Dysphagia'[ ]'$ ; Melena'[ ]'$ ; Hematochezia '[ ]'$ ; Heartburn'[ ]'$ ; Abdominal pain '[ ]'$ ; Constipation '[ ]'$ ; Diarrhea '[ ]'$ ; BRBPR '[ ]'$   ?GU: Hematuria'[ ]'$ ; Dysuria '[ ]'$ ; Nocturia'[ ]'$   ?Vascular: Pain in legs with walking '[ ]'$ ; Pain in feet with lying flat '[ ]'$ ; Non-healing sores '[ ]'$ ; Stroke '[ ]'$ ; TIA '[ ]'$ ; Slurred speech '[ ]'$ ;  ?Neuro: Headaches'[ ]'$ ; Vertigo'[ ]'$ ; Seizures'[ ]'$ ; Paresthesias'[ ]'$ ;Blurred vision '[ ]'$ ; Diplopia '[ ]'$ ; Vision changes '[ ]'$   ?Ortho/Skin: Arthritis '[ ]'$ ; Joint pain '[ ]'$ ; Muscle pain '[ ]'$ ; Joint swelling '[ ]'$ ; Back Pain '[ ]'$ ; Rash '[ ]'$   ?Psych: Depression'[ ]'$ ; Anxiety'[ ]'$   ?Heme: Bleeding problems '[ ]'$ ; Clotting disorders '[ ]'$ ; Anemia '[ ]'$   ?Endocrine: Diabetes '[ ]'$ ; Thyroid dysfunction'[ ]'$  ? ? ?Past Medical History:  ?Diagnosis Date  ? Acute myocardial infarction of inferior wall ([place]) 1980  ? Aortic stenosis   ? mild with a mean aortic valve gradient of 12 mmHg  ? Atrial fibrillation ([place])   ? holding sinus rhythm on Amiodarone  ? CAD (coronary artery disease)   ? a. S/P Ant MI 1980;  b. 1997 S/P CABG x 8 (LIMA to diag-LAD, SVG to  OM1-OM2-OM3, SVG to [facility] - Dr [name]);  c. 01/2015 Cath: 3VD, 8/8 patent grafts.  ? Cardiomyopathy   ? Dysrhythmia   ? Esophageal reflux   ? GERD (gastroesophageal reflux disease)   ? Headache   ? History of colonoscopy   ? History of transesophageal echocardiography (TEE) for monitoring   ? Other and unspecified hyperlipidemia   ? Paroxysmal atrial fibrillation ([place]) 02/20/2022  ? S/P angioplasty with stent 02/17/22 DES to proximal VG to PDA 02/20/2022  ? S/P TAVR (transcatheter aortic valve replacement)   ? a. 03/2015 26 mm Edwards Sapien 3 transcatheter heart valve placed via open left transfemoral approach.  ? Severe aortic stenosis 08/10/2012  ? Skin lesions, generalized   ? facial which may represent actinic keratoses and possible photosensitivity from Amiodarone  ? Unspecified essential hypertension   ? ? ?  Current Outpatient Medications  ?Medication Sig Dispense Refill  ? acetaminophen (TYLENOL) 325 MG tablet Take 1-2 tablets (325-650 mg total) by mouth every 4 (four) hours as needed for mild pain.    ? albuterol (VENTOLIN HFA) 108 (90 Base) MCG/ACT inhaler INHALE 2 PUFFS BY MOUTH EVERY 4 HOURS ASNEEDED FOR WHEEZING OR SHORTNESS OF BREATH. (Patient taking differently: 2 puffs every 4 (four) hours as needed for wheezing or shortness of breath.) 6.7 g 1  ? amiodarone (PACERONE) 200 MG tablet Take 1 tablet (200 mg total) by mouth daily. 30 tablet 0  ? Ascorbic Acid (VITAMIN C) 1000 MG tablet Take 1,000 mg by mouth daily.    ? atorvastatin (LIPITOR) 80 MG tablet Take 1 tablet (80 mg total) by mouth daily. 30 tablet 6  ? clopidogrel (PLAVIX) 75 MG tablet Take 1 tablet (75 mg total) by mouth daily with breakfast. 30 tablet 0  ? diclofenac Sodium (VOLTAREN) 1 % GEL Apply 4 g topically 4 (four) times daily. (Patient taking differently: Apply 4 g topically 4 (four) times daily. As needed) 100 g 0  ? empagliflozin (JARDIANCE) 10 MG TABS tablet Take 1 tablet (10 mg total) by mouth daily. 30 tablet 0  ? furosemide  (LASIX) 20 MG tablet Take 1 tablet (20 mg total) by mouth daily as needed. For swelling in the legs, shortness of breath or if your weight goes up by 2 lbs overnight. Call cardiology for input. 30 tablet 0  ? loratadi

## 2022-03-21 NOTE — Telephone Encounter (Signed)
Called to confirm Heart & Vascular Transitions of Care appointment at 3pm on 03/21/22. Patient reminded to bring all medications and pill box organizer with them. Confirmed patient has transportation. Gave directions, instructed to utilize [place] parking. ? ?Confirmed appointment prior to ending call.   ? ?Sister in law will be driving him to appointment.  ? ?Earnestine Leys, BSN, RN ?Heart Failure Nurse Navigator ?Secure Chat Only  ?

## 2022-03-22 ENCOUNTER — Telehealth: Payer: Self-pay | Admitting: Family Medicine

## 2022-03-22 ENCOUNTER — Telehealth: Payer: Self-pay

## 2022-03-22 NOTE — Telephone Encounter (Signed)
-----   Message from Charlton Haws, [facility] sent at 03/22/2022 12:24 PM EDT ----- ?Please see below - pt has been off amiodarone since discharge 4/11. It is not clear if he needs to restart this immediately or if he should wait for INR to be checked, given drug interaction between warfarin and amiodarone. Please advise. ?

## 2022-03-22 NOTE — Telephone Encounter (Signed)
Left a message for Jesus [name] to return my call.  ?

## 2022-03-22 NOTE — Telephone Encounter (Signed)
Patient is not taking losartan, furosemide or amiodarone since discharge from hospital 4/11.  ? ?Reviewed discharge notes 4/11. Losartan was discontinued due to AKI. Furosemide was switched to PRN use. Amiodarone was held at discharge with plan for cardiology f/u in 1 week to determine when to restart. ? ?Reviewed cardiology notes from  4/17. It is noted that furosemide was switched to PRN use. It is noted patient is on amiodarone, though per pt report today he has not been taking this. Holding amiodarone may effect INR, which has not been checked since discharge. ? ?Pt has PCP visit 4/24. Pt has been advised to continue holding amiodarone until PCP/INR appt OR if he hears from cardiology. ?

## 2022-03-22 NOTE — Telephone Encounter (Signed)
Cecelia given verbal orders ?

## 2022-03-22 NOTE — Telephone Encounter (Signed)
Patient re-checked his medications and on second look he reports he HAS been taking amiodarone. Nothing further needed. ?

## 2022-03-22 NOTE — Telephone Encounter (Signed)
Cecelia from [facility] calling for verbal orders for physical therapy. Can be reached at [phone] and a detailed message can be left on vm ?

## 2022-03-22 NOTE — Telephone Encounter (Signed)
Pt has INR scheduled on 4/24. He was therapeutic on his last check in the hospital. He also verified, along with his sister-in-law Letta Median who prepares his med box, that he HAS been on Amiodarone '200mg'$  once daily since discharge from the hospital with no missed doses. Per OV note by [name] on 11/15/21: ?Maintaining sinus rhythm on amiodarone.  EKG reviewed today and confirm sinus rhythm.  Anticoagulated with warfarin.  No major bleeding problems reported.  He did have some epistaxis recently but attributed this to the dry conditions.  Does not seem to be an ongoing problem.  We will check liver function tests and TSH today for amiodarone monitoring.  We will check a PA and lateral chest x-ray for amiodarone monitoring as well. ? ?All labs were in range at that time and CXR showed no evidence of amiodarone lung toxicity. Pt was only to decrease dose post-hospital stay from '400mg'$  daily to '200mg'$  daily which he states he has done. 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 ?

## 2022-03-23 DIAGNOSIS — I5023 Acute on chronic systolic (congestive) heart failure: Secondary | ICD-10-CM | POA: Diagnosis not present

## 2022-03-23 DIAGNOSIS — I130 Hypertensive heart and chronic kidney disease with heart failure and stage 1 through stage 4 chronic kidney disease, or unspecified chronic kidney disease: Secondary | ICD-10-CM | POA: Diagnosis not present

## 2022-03-23 DIAGNOSIS — K219 Gastro-esophageal reflux disease without esophagitis: Secondary | ICD-10-CM | POA: Diagnosis not present

## 2022-03-23 DIAGNOSIS — I214 Non-ST elevation (NSTEMI) myocardial infarction: Secondary | ICD-10-CM | POA: Diagnosis not present

## 2022-03-23 DIAGNOSIS — Z7984 Long term (current) use of oral hypoglycemic drugs: Secondary | ICD-10-CM | POA: Diagnosis not present

## 2022-03-23 DIAGNOSIS — I480 Paroxysmal atrial fibrillation: Secondary | ICD-10-CM | POA: Diagnosis not present

## 2022-03-23 DIAGNOSIS — D509 Iron deficiency anemia, unspecified: Secondary | ICD-10-CM | POA: Diagnosis not present

## 2022-03-23 DIAGNOSIS — E7849 Other hyperlipidemia: Secondary | ICD-10-CM | POA: Diagnosis not present

## 2022-03-23 DIAGNOSIS — N189 Chronic kidney disease, unspecified: Secondary | ICD-10-CM | POA: Diagnosis not present

## 2022-03-23 DIAGNOSIS — I4821 Permanent atrial fibrillation: Secondary | ICD-10-CM | POA: Diagnosis not present

## 2022-03-23 DIAGNOSIS — I350 Nonrheumatic aortic (valve) stenosis: Secondary | ICD-10-CM | POA: Diagnosis not present

## 2022-03-23 DIAGNOSIS — I2510 Atherosclerotic heart disease of native coronary artery without angina pectoris: Secondary | ICD-10-CM | POA: Diagnosis not present

## 2022-03-23 DIAGNOSIS — I429 Cardiomyopathy, unspecified: Secondary | ICD-10-CM | POA: Diagnosis not present

## 2022-03-23 DIAGNOSIS — R001 Bradycardia, unspecified: Secondary | ICD-10-CM | POA: Diagnosis not present

## 2022-03-23 DIAGNOSIS — Z7902 Long term (current) use of antithrombotics/antiplatelets: Secondary | ICD-10-CM | POA: Diagnosis not present

## 2022-03-23 DIAGNOSIS — I951 Orthostatic hypotension: Secondary | ICD-10-CM | POA: Diagnosis not present

## 2022-03-24 ENCOUNTER — Ambulatory Visit: Payer: Medicare Other | Admitting: Physician Assistant

## 2022-03-24 DIAGNOSIS — I2510 Atherosclerotic heart disease of native coronary artery without angina pectoris: Secondary | ICD-10-CM | POA: Diagnosis not present

## 2022-03-24 DIAGNOSIS — I130 Hypertensive heart and chronic kidney disease with heart failure and stage 1 through stage 4 chronic kidney disease, or unspecified chronic kidney disease: Secondary | ICD-10-CM | POA: Diagnosis not present

## 2022-03-24 DIAGNOSIS — I214 Non-ST elevation (NSTEMI) myocardial infarction: Secondary | ICD-10-CM | POA: Diagnosis not present

## 2022-03-24 DIAGNOSIS — E7849 Other hyperlipidemia: Secondary | ICD-10-CM | POA: Diagnosis not present

## 2022-03-24 DIAGNOSIS — I480 Paroxysmal atrial fibrillation: Secondary | ICD-10-CM | POA: Diagnosis not present

## 2022-03-24 DIAGNOSIS — I4821 Permanent atrial fibrillation: Secondary | ICD-10-CM | POA: Diagnosis not present

## 2022-03-24 DIAGNOSIS — D509 Iron deficiency anemia, unspecified: Secondary | ICD-10-CM | POA: Diagnosis not present

## 2022-03-24 DIAGNOSIS — I5023 Acute on chronic systolic (congestive) heart failure: Secondary | ICD-10-CM | POA: Diagnosis not present

## 2022-03-24 DIAGNOSIS — K219 Gastro-esophageal reflux disease without esophagitis: Secondary | ICD-10-CM | POA: Diagnosis not present

## 2022-03-24 DIAGNOSIS — Z7984 Long term (current) use of oral hypoglycemic drugs: Secondary | ICD-10-CM | POA: Diagnosis not present

## 2022-03-24 DIAGNOSIS — N189 Chronic kidney disease, unspecified: Secondary | ICD-10-CM | POA: Diagnosis not present

## 2022-03-24 DIAGNOSIS — Z7902 Long term (current) use of antithrombotics/antiplatelets: Secondary | ICD-10-CM | POA: Diagnosis not present

## 2022-03-24 DIAGNOSIS — R001 Bradycardia, unspecified: Secondary | ICD-10-CM | POA: Diagnosis not present

## 2022-03-24 DIAGNOSIS — I350 Nonrheumatic aortic (valve) stenosis: Secondary | ICD-10-CM | POA: Diagnosis not present

## 2022-03-24 DIAGNOSIS — I951 Orthostatic hypotension: Secondary | ICD-10-CM | POA: Diagnosis not present

## 2022-03-24 DIAGNOSIS — I429 Cardiomyopathy, unspecified: Secondary | ICD-10-CM | POA: Diagnosis not present

## 2022-03-27 NOTE — Progress Notes (Signed)
?Cardiology Office Note:   ? ?Date:  03/29/2022  ? ?ID:  Jesus [name], DOB 09-07-1941, MRN [id number] ? ?PCP:  [name], MD ?  ?[facility] Providers ?Cardiologist:  [name], MD ?Click to update primary MD,subspecialty MD or APP then REFRESH:1}   ? ?Referring MD: [name], MD  ? ?Chief Complaint: hospital f/u CHF and CAD ? ?History of Present Illness:   ? ?Jesus [name] is a very pleasant 81 y.o. male with a hx of CAD s/p CABG, persistent atrial fibrillation on chronic anticoagulation, subaortic stenosis s/p TAVR, CKD, hypertension, CHF, and IDA.  ? ?History of remote anterior MI 1980, CABG x 8 (LIMA to diag LAD, SVG to OM1-OM2-OM3, SVG to [facility] in 1997, [place] 2016 prior to TAVR 8/8 patent grafts. Underwent TAVR 2016 with Crete 3 valve via open left transfemoral approach.  LVEF generally ranged from 40 to 45%.  Patient had moderate paravalvular aortic regurgitation following TAVR. ? ?Admission 02/15/22 got unusually tired after walking for 10 minutes, went to work and after about 4-1/2 hours he felt very tired and fatigued.  Felt like he had indigestion at bedtime.  Next morning symptoms worsened and he presented to ED. Hs troponin elevated at 700 ? 1300 ? 1757. LHC 02/17/2022 revealed severe stenosis in the proximal body of the SVG to PDA. Received DES to vein graft to PDA, LIMA to LAD is patent. Echo with EF 35-40% with LV rwma, G2DD, LA severely dilated, mild AI.  On 02/18/2022 he developed NSVT and was given IV amiodarone and beta-blocker.  Electrolytes were stable and patient was seen by Dr. Caryl Comes.  Amiodarone was increased and he continued to be monitored and patient improved on amiodarone and he was transitioned to oral amiodarone. Advised to continue aspirin, Plavix, and warfarin until INR is therapeutic at which time aspirin could be stopped.  He was discharged home on 02/20/2022.  ? ?Unfortunately, he was readmitted 3/23-03/08/22 for acute on chronic systolic heart  failure and acute respiratory failure. BNP elevated at 1033 and CXR revealed evidence of cardiogenic pulmonary edema with bilateral pleural effusions. Was adequately diuresed and rehab was recommended.  In rehab, cardiology was consulted for slight increase in serum creatinine ranging from 1.3-1.7.  Prior to admission creatinine was 1.1.  He did not appear volume overloaded on exam at time of consult and was thought to be over diuresed.  Spironolactone was stopped and Lasix was switched from daily to as needed.  Aspirin was discontinued in the setting of INR therapeutic. Had follow-up with AHF clinic on 03/21/22 at which time ? ?Today, he is here here with his sister-in-law who helps to take care of him. Is feeling well. 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 He is anxious to return to work. His sister in law reports she and her daughter have helped patient significantly improve his diet as it was previously unrestricted. Admits that dietary indiscretion is likely what caused his return hospital admission for CHF following PCI. He has now greatly reduced sodium and saturated fat intake. He denies chest pain, shortness of breath, lower extremity edema, fatigue, palpitations, melena, hematuria, hemoptysis, diaphoresis, weakness, presyncope, syncope, orthopnea, and PND. ? ? ?Past Medical History:  ?Diagnosis Date  ? 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 Aortic stenosis   ? mild with a mean aortic valve gradient of 12 mmHg  ? Atrial fibrillation ([place])   ?  holding sinus rhythm on Amiodarone  ? CAD (coronary artery disease)   ? a. S/P Ant MI 1980;  b. 1997 S/P CABG x 8 (LIMA to diag-LAD, SVG to OM1-OM2-OM3, SVG to [facility] - Dr [name]);  c. 01/2015 Cath: 3VD, 8/8 patent grafts.  ? Cardiomyopathy   ? Dysrhythmia   ? Esophageal reflux   ? GERD (gastroesophageal reflux disease)   ? Headache   ? History of colonoscopy   ?  History of transesophageal echocardiography (TEE) for monitoring   ? Other and unspecified hyperlipidemia   ? Paroxysmal atrial fibrillation ([place]) 02/20/2022  ? S/P angioplasty with stent 02/17/22 DES to proximal VG to PDA 02/20/2022  ? S/P TAVR (transcatheter aortic valve replacement)   ? a. 03/2015 26 mm Edwards Sapien 3 transcatheter heart valve placed via open left transfemoral approach.  ? Severe aortic stenosis 08/10/2012  ? Skin lesions, generalized   ? facial which [name] represent actinic keratoses and possible photosensitivity from Amiodarone  ? Unspecified essential hypertension   ? ? ?Past Surgical History:  ?Procedure Laterality Date  ? CARDIAC CATHETERIZATION    ? CARDIOVERSION  11/18/2006  ? Dr. [name]  ? CATARACT EXTRACTION W/ INTRAOCULAR LENS IMPLANT  April '13  (Dr. [name])  ? left eye only  ? CHOLECYSTECTOMY    ? CORONARY ARTERY BYPASS GRAFT  02/09/1996  ? LIMA to diag-LAD, SVG to OM1-OM2-OM3, SVG to [facility]  ? CORONARY STENT INTERVENTION N/A 02/17/2022  ? Procedure: CORONARY STENT INTERVENTION;  Surgeon: Burnell Blanks, MD;  Location: [place] [facility];  Service: Cardiovascular;  Laterality: N/A;  ? CORONARY/GRAFT ANGIOGRAPHY N/A 02/17/2022  ? Procedure: CORONARY/GRAFT ANGIOGRAPHY;  Surgeon: Burnell Blanks, MD;  Location: [place] [facility];  Service: Cardiovascular;  Laterality: N/A;  ? EYE SURGERY    ? LEFT AND RIGHT HEART CATHETERIZATION WITH CORONARY/GRAFT ANGIOGRAM N/A 01/26/2015  ? Procedure: LEFT AND RIGHT HEART CATHETERIZATION WITH [name];  Surgeon: [name], MD;  Location: [facility] CATH LAB;  Service: Cardiovascular;  Laterality: N/A;  ? TEE WITHOUT CARDIOVERSION N/A 03/10/2015  ? Procedure: TRANSESOPHAGEAL ECHOCARDIOGRAM (TEE);  Surgeon: [name], MD;  Location: [place];  Service: Open Heart Surgery;  Laterality: N/A;  ? TONSILLECTOMY    ? TRANSCATHETER AORTIC VALVE REPLACEMENT, TRANSFEMORAL N/A 03/10/2015  ? Procedure: TRANSCATHETER AORTIC VALVE  REPLACEMENT, TRANSFEMORAL;  Surgeon: [name], MD;  Location: [place];  Service: Open Heart Surgery;  Laterality: N/A;  ? ? ?Current Medications: ?Current Meds  ?Medication Sig  ? acetaminophen (TYLENOL) 325 MG tablet Take 1-2 tablets (325-650 mg total) by mouth every 4 (four) hours as needed for mild pain.  ? albuterol (VENTOLIN HFA) 108 (90 Base) MCG/ACT inhaler INHALE 2 PUFFS BY MOUTH EVERY 4 HOURS ASNEEDED FOR WHEEZING OR SHORTNESS OF BREATH. (Patient taking differently: 2 puffs every 4 (four) hours as needed for wheezing or shortness of breath.)  ? amiodarone (PACERONE) 200 MG tablet Take 1 tablet (200 mg total) by mouth daily.  ? Ascorbic Acid (VITAMIN C) 1000 MG tablet Take 1,000 mg by mouth daily.  ? atorvastatin (LIPITOR) 80 MG tablet Take 1 tablet (80 mg total) by mouth daily.  ? clopidogrel (PLAVIX) 75 MG tablet Take 1 tablet (75 mg total) by mouth daily with breakfast.  ? diclofenac Sodium (VOLTAREN) 1 % GEL Apply 4 g topically 4 (four) times daily. (Patient taking differently: Apply 4 g topically 4 (four) times daily. As needed)  ? empagliflozin (JARDIANCE) 10 MG TABS tablet Take 1 tablet (10 mg  total) by mouth daily.  ? furosemide (LASIX) 20 MG tablet Take 1 tablet (20 mg total) by mouth daily as needed. For swelling in the legs, shortness of breath or if your weight goes up by 2 lbs overnight. Call cardiology for input.  ? loratadine (CLARITIN) 10 MG tablet Take 10 mg by mouth daily as needed for allergies.  ? methocarbamol (ROBAXIN) 500 MG tablet TAKE 1 TABLET BY MOUTH EVERY 6 HOURS AS NEEDED FOR MUSCLE SPASMS  ? Multiple Vitamins-Minerals (MULTIVITAMIN,TX-MINERALS) tablet Take 1 tablet by mouth daily.  ? nitroGLYCERIN (NITROSTAT) 0.4 MG SL tablet Place 1 tablet (0.4 mg total) under the tongue every 5 (five) minutes x 3 doses as needed for chest pain.  ? warfarin (COUMADIN) 4 MG tablet Take 1 tablet (4 mg total) by mouth every evening.  ?  ? ?Allergies:   Amlodipine, Codeine, and Iodine   ? ?Social History  ? ?Socioeconomic History  ? Marital status: Married  ?  Spouse name: Not on file  ? Number of children: Not on file  ? Years of education: Not on file  ? Highest education level: Not on file  ?Mariane Masters

## 2022-03-28 ENCOUNTER — Ambulatory Visit (INDEPENDENT_AMBULATORY_CARE_PROVIDER_SITE_OTHER): Payer: Medicare Other

## 2022-03-28 ENCOUNTER — Encounter: Payer: Self-pay | Admitting: Family Medicine

## 2022-03-28 ENCOUNTER — Ambulatory Visit: Payer: Medicare Other | Admitting: Family Medicine

## 2022-03-28 VITALS — BP 138/60 | HR 55 | Temp 97.8°F | Ht 61.0 in | Wt 122.0 lb

## 2022-03-28 DIAGNOSIS — I25810 Atherosclerosis of coronary artery bypass graft(s) without angina pectoris: Secondary | ICD-10-CM

## 2022-03-28 DIAGNOSIS — I480 Paroxysmal atrial fibrillation: Secondary | ICD-10-CM

## 2022-03-28 DIAGNOSIS — I10 Essential (primary) hypertension: Secondary | ICD-10-CM | POA: Diagnosis not present

## 2022-03-28 DIAGNOSIS — I5023 Acute on chronic systolic (congestive) heart failure: Secondary | ICD-10-CM

## 2022-03-28 DIAGNOSIS — Z7901 Long term (current) use of anticoagulants: Secondary | ICD-10-CM | POA: Diagnosis not present

## 2022-03-28 DIAGNOSIS — D509 Iron deficiency anemia, unspecified: Secondary | ICD-10-CM

## 2022-03-28 LAB — POCT INR: INR: 5.9 — AB (ref 2.0–3.0)

## 2022-03-28 MED ORDER — CLOPIDOGREL BISULFATE 75 MG PO TABS: 75.0000 mg | ORAL_TABLET | Freq: Every day | ORAL | 1 refills | Status: DC

## 2022-03-28 MED ORDER — EMPAGLIFLOZIN 10 MG PO TABS: 10.0000 mg | ORAL_TABLET | Freq: Every day | ORAL | 1 refills | Status: DC

## 2022-03-28 MED ORDER — ATORVASTATIN CALCIUM 80 MG PO TABS: 80.0000 mg | ORAL_TABLET | Freq: Every day | ORAL | 3 refills | Status: DC

## 2022-03-28 NOTE — Progress Notes (Signed)
?Subjective:  ?  ? Patient ID: Jesus [name], male   DOB: January 05, 1941, 81 y.o.   MRN: [id number] ? ?HPI ? ?Jesus [name] is seen for hospital follow-up.  He was admitted March 26 with acute on chronic systolic heart failure and acute hypoxic respiratory failure.  He was admitted on the above date through the fourth and then went to rehab for a week- and now home. ? ?Prior to this admission he had been recently admitted March 14 through 19 for NSTEMI.  Refer to previous notes for further details. ? ?Past medical history significant for history of CAD, CABG, aortic stenosis, TAVR, chronic systolic heart failure, hypertension, A-fib.  Recent cath noted severe native vessel CAD s/p 6V CABG with 6/6 patent grafts, severe stenosis in the proximal body of the SVG to the PDA-Successful PTCA/DES x 1 proximal body of SVG to PDA.  He presented to the ER on the day of admission with some increased dyspnea with exertion and increased lower extremity edema which seemed to come on fairly acutely.  Chest x-ray confirmed pleural effusion and BNP level 1033.  Echocardiogram showed EF 35 to 40% with inferior and inferolateral hypokinesis.  Diuresed with IV Lasix.  He was discharged to rehab on regimen of Toprol, Aldactone, Jardiance. ?He had developed some orthostatic hypotension on the 28th and amlodipine and losartan discontinued.  He had some iron deficiency anemia with serum iron 15 and transferrin saturation of 6 and received IV iron. ? ?Admitted to rehab because of some generalized weakness and balance difficulties.  He developed some bradycardia and metoprolol was initially decreased.  During rehab stay he developed acute on chronic renal failure and cardiology consulted.  Aldactone discontinued.  Lasix was changed to as needed.  His metoprolol was increased to 25 mg daily.  He subsequently developed recurrent bradycardia and metoprolol was discontinued. ? ?He is happy to be back home and is anxious to start driving again.  He is also  inquiring about when he can go back to work.  Works part-time fairly sedentary work.  Has follow-up with cardiology tomorrow.  Daily weights have been very stable.  He also needs follow-up pro time today with our Coumadin clinic.  Is getting some home PT.  Overall feels improved.  No dyspnea. ? ?Wt Readings from Last 3 Encounters:  ?03/28/22 122 lb (55.3 kg)  ?03/21/22 124 lb (56.2 kg)  ?03/15/22 121 lb 7.6 oz (55.1 kg)  ? ? ? ?Past Medical History:  ?Diagnosis Date  ? Acute myocardial infarction of inferior wall ([place]) 1980  ? Aortic stenosis   ? mild with a mean aortic valve gradient of 12 mmHg  ? Atrial fibrillation ([place])   ? holding sinus rhythm on Amiodarone  ? CAD (coronary artery disease)   ? a. S/P Ant MI 1980;  b. 1997 S/P CABG x 8 (LIMA to diag-LAD, SVG to OM1-OM2-OM3, SVG to [facility] - Dr [name]);  c. 01/2015 Cath: 3VD, 8/8 patent grafts.  ? Cardiomyopathy   ? Dysrhythmia   ? Esophageal reflux   ? GERD (gastroesophageal reflux disease)   ? Headache   ? History of colonoscopy   ? History of transesophageal echocardiography (TEE) for monitoring   ? Other and unspecified hyperlipidemia   ? Paroxysmal atrial fibrillation ([place]) 02/20/2022  ? S/P angioplasty with stent 02/17/22 DES to proximal VG to PDA 02/20/2022  ? S/P TAVR (transcatheter aortic valve replacement)   ? a. 03/2015 26 mm Edwards Sapien 3 transcatheter heart valve placed via open left  transfemoral approach.  ? Severe aortic stenosis 08/10/2012  ? Skin lesions, generalized   ? facial which may represent actinic keratoses and possible photosensitivity from Amiodarone  ? Unspecified essential hypertension   ? ?Past Surgical History:  ?Procedure Laterality Date  ? CARDIAC CATHETERIZATION    ? CARDIOVERSION  11/18/2006  ? Dr. [name]  ? CATARACT EXTRACTION W/ INTRAOCULAR LENS IMPLANT  April '13  (Dr. [name])  ? left eye only  ? CHOLECYSTECTOMY    ? CORONARY ARTERY BYPASS GRAFT  02/09/1996  ? LIMA to diag-LAD, SVG to OM1-OM2-OM3, SVG to [facility]  ?  CORONARY STENT INTERVENTION N/A 02/17/2022  ? Procedure: CORONARY STENT INTERVENTION;  Surgeon: Burnell Blanks, MD;  Location: [place] [facility];  Service: Cardiovascular;  Laterality: N/A;  ? CORONARY/GRAFT ANGIOGRAPHY N/A 02/17/2022  ? Procedure: CORONARY/GRAFT ANGIOGRAPHY;  Surgeon: Burnell Blanks, MD;  Location: [place] CV LAB;  Service: Cardiovascular;  Laterality: N/A;  ? EYE SURGERY    ? LEFT AND RIGHT HEART CATHETERIZATION WITH CORONARY/GRAFT ANGIOGRAM N/A 01/26/2015  ? Procedure: LEFT AND RIGHT HEART CATHETERIZATION WITH [name];  Surgeon: [name], MD;  Location: [facility] CATH LAB;  Service: Cardiovascular;  Laterality: N/A;  ? TEE WITHOUT CARDIOVERSION N/A 03/10/2015  ? Procedure: TRANSESOPHAGEAL ECHOCARDIOGRAM (TEE);  Surgeon: [name], MD;  Location: [place];  Service: Open Heart Surgery;  Laterality: N/A;  ? TONSILLECTOMY    ? TRANSCATHETER AORTIC VALVE REPLACEMENT, TRANSFEMORAL N/A 03/10/2015  ? Procedure: TRANSCATHETER AORTIC VALVE REPLACEMENT, TRANSFEMORAL;  Surgeon: [name], MD;  Location: [place];  Service: Open Heart Surgery;  Laterality: N/A;  ? ? reports that he has never smoked. He has never used smokeless tobacco. He reports that he does not drink alcohol and does not use drugs. ?family history includes Atrial fibrillation in his brother; Crohn's disease in his mother; Heart attack (age of onset: 71) in his father; Heart disease in his father and paternal uncle; Heart failure in his father; Prostate cancer in his paternal uncle; Stroke (age of onset: 36) in his mother. ?Allergies  ?Allergen Reactions  ? Amlodipine Other (See Comments)  ?  Swelling?  ? Codeine Other (See Comments)  ?  Pt does not remember  ? Iodine Swelling  ? ? ? ?Review of Systems  ?Constitutional:  Negative for appetite change, fatigue and unexpected weight change.  ?Eyes:  Negative for visual disturbance.  ?Respiratory:  Negative for cough, chest tightness and shortness of  breath.   ?Cardiovascular:  Negative for chest pain, palpitations and leg swelling.  ?Endocrine: Negative for polydipsia and polyuria.  ?Neurological:  Negative for dizziness, syncope, weakness, light-headedness and headaches.  ? ?   ?Objective:  ? Physical Exam ?Constitutional:   ?   Appearance: He is well-developed.  ?HENT:  ?   Right Ear: External ear normal.  ?   Left Ear: External ear normal.  ?Eyes:  ?   Pupils: Pupils are equal, round, and reactive to light.  ?Neck:  ?   Thyroid: No thyromegaly.  ?Cardiovascular:  ?   Rate and Rhythm: Normal rate and regular rhythm.  ?Pulmonary:  ?   Effort: Pulmonary effort is normal. No respiratory distress.  ?   Breath sounds: Normal breath sounds. No wheezing or rales.  ?Musculoskeletal:  ?   Cervical back: Neck supple.  ?   Right lower leg: No edema.  ?   Left lower leg: No edema.  ?Neurological:  ?   Mental Status: He is alert and oriented to  person, place, and time.  ? ? ?   ?Assessment:  ?   ?#1 recent acute on chronic systolic heart failure.  EF 35 to 40%.  Recent BNP level 1033.  Home weights have been very stable over the past week.  No dyspnea at this time. ? ?#2 history of extensive CAD with prior bypass and status post angioplasty with stent 02-17-2022 DES to proximal VG to PDA ? ?#3 recent acute kidney injury superimposed on chronic kidney disease.  Follow-up renal panel 1 week ago stable. ? ?#4 hypertension stable.  Had initial elevated reading here but improved after rest. ? ?#5 iron deficiency anemia with recent IV iron infusion.  Recent hemoglobin stable at 11.2 ? ?#6 history of atrial fibrillation on Coumadin.  INR today elevated at 5.9.  Recent initiation of amiodarone.  Coumadin held for a few days per Coumadin clinic instructions with close follow-up scheduled ?   ?Plan:  ?   ?-Continue home weights daily.  He knows to start Lasix if he has 2 or more pounds of weight gain in 1 day ?-Continue close follow-up with cardiology.  He is requesting guidance  regarding when he can return to driving and work. ?-Patient has been instructed to hold Coumadin for the next few days because of over anticoagulation.  He has close follow-up scheduled with Coumadin clinic. ?-

## 2022-03-28 NOTE — Patient Instructions (Addendum)
Pre visit review using our clinic review tool, if applicable. No additional management support is needed unless otherwise documented below in the visit note. ? ?Hold warfarin dose for 3 days, today, tomorrow and the day after tomorrow and take 1 tablet on Thursday and recheck INR on 4/28 at [facility], at [address] [place]. Contact the coumadin clinic at [phone] if any concerns. If any bleeding go to the ER or contact the office.  ?

## 2022-03-28 NOTE — Progress Notes (Signed)
Pt recently restarted amiodarone at a higher dose, '200mg'$ , since d/c from the hospital on 03/08/22. He has only restarted amiodarone last week. Pt was on 100 mg prior to the increase. ? ?Hold warfarin dose for 3 days, today, tomorrow and the day after tomorrow and take 1 tablet on Thursday and recheck INR on 4/28 at [facility], at [address], [place]. Contact the coumadin clinic at [phone] if any concerns. If any bleeding go to the ER or contact the office.  ?

## 2022-03-29 ENCOUNTER — Encounter: Payer: Self-pay | Admitting: Nurse Practitioner

## 2022-03-29 ENCOUNTER — Ambulatory Visit: Payer: Medicare Other | Admitting: Nurse Practitioner

## 2022-03-29 VITALS — BP 130/70 | HR 59 | Ht 61.0 in | Wt 122.0 lb

## 2022-03-29 DIAGNOSIS — E785 Hyperlipidemia, unspecified: Secondary | ICD-10-CM | POA: Diagnosis not present

## 2022-03-29 DIAGNOSIS — I2510 Atherosclerotic heart disease of native coronary artery without angina pectoris: Secondary | ICD-10-CM | POA: Diagnosis not present

## 2022-03-29 DIAGNOSIS — N189 Chronic kidney disease, unspecified: Secondary | ICD-10-CM

## 2022-03-29 DIAGNOSIS — I4819 Other persistent atrial fibrillation: Secondary | ICD-10-CM

## 2022-03-29 DIAGNOSIS — Z79899 Other long term (current) drug therapy: Secondary | ICD-10-CM

## 2022-03-29 DIAGNOSIS — E782 Mixed hyperlipidemia: Secondary | ICD-10-CM | POA: Diagnosis not present

## 2022-03-29 DIAGNOSIS — I5022 Chronic systolic (congestive) heart failure: Secondary | ICD-10-CM

## 2022-03-29 DIAGNOSIS — I480 Paroxysmal atrial fibrillation: Secondary | ICD-10-CM | POA: Diagnosis not present

## 2022-03-29 DIAGNOSIS — I5023 Acute on chronic systolic (congestive) heart failure: Secondary | ICD-10-CM | POA: Diagnosis not present

## 2022-03-29 NOTE — Patient Instructions (Signed)
Medication Instructions:  ? ?Your physician recommends that you continue on your current medications as directed. Please refer to the Current Medication list given to you today. ? ? ?*If you need a refill on your cardiac medications before your next appointment, please call your pharmacy* ? ? ?Lab Work: ? ?TODAY!!!!! BMET/CBC ? ?If you have labs (blood work) drawn today and your tests are completely normal, you will receive your results only by: ?MyChart Message (if you have MyChart) OR ?A paper copy in the mail ?If you have any lab test that is abnormal or we need to change your treatment, we will call you to review the results. ? ?Follow-Up: ?At [facility], you and your health needs are our priority.  As part of our continuing mission to provide you with exceptional heart care, we have created designated Provider Care Teams.  These Care Teams include your primary Cardiologist (physician) and Advanced Practice Providers (APPs -  Physician Assistants and Nurse Practitioners) who all work together to provide you with the care you need, when you need it. ? ?We recommend signing up for the patient portal called "MyChart".  Sign up information is provided on this After Visit Summary.  MyChart is used to connect with patients for Virtual Visits (Telemedicine).  Patients are able to view lab/test results, encounter notes, upcoming appointments, etc.  Non-urgent messages can be sent to your provider as well.   ?To learn more about what you can do with MyChart, go to [email or web address].   ? ?Your next appointment:   ?1 week(s) ? ?The format for your next appointment:   ?In Person ? ?Provider:   ?Dr. Caryl Comes ? ?If primary card or EP is not listed click here to update    :1}  ? ? ?Other Instructions ?HOW TO TAKE YOUR BLOOD PRESSURE: ?Rest 5 minutes before taking your blood pressure. ? Don?t smoke or drink caffeinated beverages for at least 30 minutes before. ?Take your blood pressure before (not after) you eat. ?Sit  comfortably with your back supported and both feet on the floor (don?t cross your legs). ?Elevate your arm to heart level on a table or a desk. ?Use the proper sized cuff. It should fit smoothly and snugly around your bare upper arm. There should be enough room to slip a fingertip under the cuff. The bottom edge of the cuff should be 1 inch above the crease of the elbow. Adopting a Healthy Lifestyle. ?Goal blood pressure is less than 140/80 ? ?No more fluid per day than 64 fluid ounces mostly water.  ? ?Important Information About Sugar ? ? ? ? ?  ?

## 2022-03-30 DIAGNOSIS — I429 Cardiomyopathy, unspecified: Secondary | ICD-10-CM | POA: Diagnosis not present

## 2022-03-30 DIAGNOSIS — I951 Orthostatic hypotension: Secondary | ICD-10-CM | POA: Diagnosis not present

## 2022-03-30 DIAGNOSIS — N189 Chronic kidney disease, unspecified: Secondary | ICD-10-CM | POA: Diagnosis not present

## 2022-03-30 DIAGNOSIS — I2510 Atherosclerotic heart disease of native coronary artery without angina pectoris: Secondary | ICD-10-CM | POA: Diagnosis not present

## 2022-03-30 DIAGNOSIS — E7849 Other hyperlipidemia: Secondary | ICD-10-CM | POA: Diagnosis not present

## 2022-03-30 DIAGNOSIS — I350 Nonrheumatic aortic (valve) stenosis: Secondary | ICD-10-CM | POA: Diagnosis not present

## 2022-03-30 DIAGNOSIS — K219 Gastro-esophageal reflux disease without esophagitis: Secondary | ICD-10-CM | POA: Diagnosis not present

## 2022-03-30 DIAGNOSIS — I5023 Acute on chronic systolic (congestive) heart failure: Secondary | ICD-10-CM | POA: Diagnosis not present

## 2022-03-30 DIAGNOSIS — I4821 Permanent atrial fibrillation: Secondary | ICD-10-CM | POA: Diagnosis not present

## 2022-03-30 DIAGNOSIS — I214 Non-ST elevation (NSTEMI) myocardial infarction: Secondary | ICD-10-CM | POA: Diagnosis not present

## 2022-03-30 DIAGNOSIS — Z7902 Long term (current) use of antithrombotics/antiplatelets: Secondary | ICD-10-CM | POA: Diagnosis not present

## 2022-03-30 DIAGNOSIS — Z7984 Long term (current) use of oral hypoglycemic drugs: Secondary | ICD-10-CM | POA: Diagnosis not present

## 2022-03-30 DIAGNOSIS — I480 Paroxysmal atrial fibrillation: Secondary | ICD-10-CM | POA: Diagnosis not present

## 2022-03-30 DIAGNOSIS — I130 Hypertensive heart and chronic kidney disease with heart failure and stage 1 through stage 4 chronic kidney disease, or unspecified chronic kidney disease: Secondary | ICD-10-CM | POA: Diagnosis not present

## 2022-03-30 DIAGNOSIS — R001 Bradycardia, unspecified: Secondary | ICD-10-CM | POA: Diagnosis not present

## 2022-03-30 DIAGNOSIS — D509 Iron deficiency anemia, unspecified: Secondary | ICD-10-CM | POA: Diagnosis not present

## 2022-03-30 LAB — BASIC METABOLIC PANEL
BUN/Creatinine Ratio: 16 (ref 10–24)
BUN: 21 mg/dL (ref 8–27)
CO2: 23 mmol/L (ref 20–29)
Calcium: 9.3 mg/dL (ref 8.6–10.2)
Chloride: 101 mmol/L (ref 96–106)
Creatinine, Ser: 1.33 mg/dL — ABNORMAL HIGH (ref 0.76–1.27)
Glucose: 94 mg/dL (ref 70–99)
Potassium: 4.8 mmol/L (ref 3.5–5.2)
Sodium: 139 mmol/L (ref 134–144)
eGFR: 53 mL/min/{1.73_m2} — ABNORMAL LOW (ref >59)

## 2022-03-30 LAB — CBC
Hematocrit: 37.2 % — ABNORMAL LOW (ref 37.5–51.0)
Hemoglobin: 12.5 g/dL — ABNORMAL LOW (ref 13.0–17.7)
MCH: 32.5 pg (ref 26.6–33.0)
MCHC: 33.6 g/dL (ref 31.5–35.7)
MCV: 97 fL (ref 79–97)
Platelets: 146 10*3/uL — ABNORMAL LOW (ref 150–450)
RBC: 3.85 x10E6/uL — ABNORMAL LOW (ref 4.14–5.80)
RDW: 13.4 % (ref 11.6–15.4)
WBC: 7.1 10*3/uL (ref 3.4–10.8)

## 2022-03-31 ENCOUNTER — Ambulatory Visit (INDEPENDENT_AMBULATORY_CARE_PROVIDER_SITE_OTHER): Payer: Medicare Other | Admitting: Licensed Clinical Social Worker

## 2022-03-31 DIAGNOSIS — I10 Essential (primary) hypertension: Secondary | ICD-10-CM

## 2022-03-31 DIAGNOSIS — I5023 Acute on chronic systolic (congestive) heart failure: Secondary | ICD-10-CM

## 2022-03-31 DIAGNOSIS — I482 Chronic atrial fibrillation, unspecified: Secondary | ICD-10-CM

## 2022-04-01 ENCOUNTER — Ambulatory Visit: Payer: Medicare Other

## 2022-04-01 DIAGNOSIS — Z7901 Long term (current) use of anticoagulants: Secondary | ICD-10-CM | POA: Diagnosis not present

## 2022-04-01 DIAGNOSIS — I4891 Unspecified atrial fibrillation: Secondary | ICD-10-CM | POA: Insufficient documentation

## 2022-04-01 LAB — POCT INR: INR: 1.9 — AB (ref 2.0–3.0)

## 2022-04-01 NOTE — Progress Notes (Signed)
Pt was started on amiodarone when d/c from hospital. Last INR on 4/24 was 5.9. Pt was advised to hold warfarin for 3 days and then resume dosing. ?Pt has been stable on prior dose for over 6 months. ? ?Due to elevated INR of 5.9 at last INR check on 4/24, no booster dose was given today even though INR is 1.9 today, and weekly dose is being decreased by 1 full tablet. Pt will return for INR check in 1 week.  ?Change weekly dose to take 1 tablet daily except take 1/2 tablet on Mondays and Thursdays. Recheck in 1 week at [facility], at [address], [place]. Contact the coumadin clinic at [phone] if any concerns. If any bleeding go to the ER or contact the office.  ? ?Pt requested BP check because his cardiologist advised him at his apt this week to check BP once daily before lunch. BP today is 116/58, HR 58, O2 sat 96%. Pt denies any lightheadedness. Advised pt to rise from a sitting or lying position slowly and if he does become lightheaded to sit back down and let cardiology know. Pt verbalized understanding. ?

## 2022-04-01 NOTE — Patient Instructions (Addendum)
Pre visit review using our clinic review tool, if applicable. No additional management support is needed unless otherwise documented below in the visit note. ? ?Change weekly dose to take 1 tablet daily except take 1/2 tablet on Mondays and Thursdays. Recheck in 1 week at [facility], at [address] [place]. Contact the coumadin clinic at [phone] if any concerns. If any bleeding go to the ER or contact the office.  ?

## 2022-04-02 DIAGNOSIS — D509 Iron deficiency anemia, unspecified: Secondary | ICD-10-CM | POA: Diagnosis not present

## 2022-04-02 DIAGNOSIS — I5023 Acute on chronic systolic (congestive) heart failure: Secondary | ICD-10-CM | POA: Diagnosis not present

## 2022-04-02 DIAGNOSIS — I4821 Permanent atrial fibrillation: Secondary | ICD-10-CM | POA: Diagnosis not present

## 2022-04-02 DIAGNOSIS — I480 Paroxysmal atrial fibrillation: Secondary | ICD-10-CM | POA: Diagnosis not present

## 2022-04-02 DIAGNOSIS — R001 Bradycardia, unspecified: Secondary | ICD-10-CM | POA: Diagnosis not present

## 2022-04-02 DIAGNOSIS — Z7902 Long term (current) use of antithrombotics/antiplatelets: Secondary | ICD-10-CM | POA: Diagnosis not present

## 2022-04-02 DIAGNOSIS — I350 Nonrheumatic aortic (valve) stenosis: Secondary | ICD-10-CM | POA: Diagnosis not present

## 2022-04-02 DIAGNOSIS — I130 Hypertensive heart and chronic kidney disease with heart failure and stage 1 through stage 4 chronic kidney disease, or unspecified chronic kidney disease: Secondary | ICD-10-CM | POA: Diagnosis not present

## 2022-04-02 DIAGNOSIS — K219 Gastro-esophageal reflux disease without esophagitis: Secondary | ICD-10-CM | POA: Diagnosis not present

## 2022-04-02 DIAGNOSIS — Z7984 Long term (current) use of oral hypoglycemic drugs: Secondary | ICD-10-CM | POA: Diagnosis not present

## 2022-04-02 DIAGNOSIS — I2510 Atherosclerotic heart disease of native coronary artery without angina pectoris: Secondary | ICD-10-CM | POA: Diagnosis not present

## 2022-04-02 DIAGNOSIS — E7849 Other hyperlipidemia: Secondary | ICD-10-CM | POA: Diagnosis not present

## 2022-04-02 DIAGNOSIS — I951 Orthostatic hypotension: Secondary | ICD-10-CM | POA: Diagnosis not present

## 2022-04-02 DIAGNOSIS — I429 Cardiomyopathy, unspecified: Secondary | ICD-10-CM | POA: Diagnosis not present

## 2022-04-02 DIAGNOSIS — N189 Chronic kidney disease, unspecified: Secondary | ICD-10-CM | POA: Diagnosis not present

## 2022-04-02 DIAGNOSIS — I214 Non-ST elevation (NSTEMI) myocardial infarction: Secondary | ICD-10-CM | POA: Diagnosis not present

## 2022-04-03 DIAGNOSIS — I5023 Acute on chronic systolic (congestive) heart failure: Secondary | ICD-10-CM

## 2022-04-03 DIAGNOSIS — I482 Chronic atrial fibrillation, unspecified: Secondary | ICD-10-CM | POA: Diagnosis not present

## 2022-04-03 DIAGNOSIS — I10 Essential (primary) hypertension: Secondary | ICD-10-CM

## 2022-04-04 ENCOUNTER — Ambulatory Visit: Payer: Medicare Other | Admitting: Internal Medicine

## 2022-04-04 ENCOUNTER — Ambulatory Visit
Admission: RE | Admit: 2022-04-04 | Discharge: 2022-04-04 | Disposition: A | Discharge status: Home / Self Care | Payer: Medicare Other | Source: Ambulatory Visit | Attending: Internal Medicine | Admitting: Internal Medicine

## 2022-04-04 ENCOUNTER — Encounter: Payer: Self-pay | Admitting: Internal Medicine

## 2022-04-04 VITALS — BP 146/64 | HR 56 | Ht 61.0 in | Wt 121.4 lb

## 2022-04-04 DIAGNOSIS — I5022 Chronic systolic (congestive) heart failure: Secondary | ICD-10-CM | POA: Diagnosis not present

## 2022-04-04 DIAGNOSIS — Z952 Presence of prosthetic heart valve: Secondary | ICD-10-CM | POA: Diagnosis not present

## 2022-04-04 DIAGNOSIS — I4819 Other persistent atrial fibrillation: Secondary | ICD-10-CM | POA: Diagnosis not present

## 2022-04-04 DIAGNOSIS — E032 Hypothyroidism due to medicaments and other exogenous substances: Secondary | ICD-10-CM | POA: Diagnosis not present

## 2022-04-04 DIAGNOSIS — R0989 Other specified symptoms and signs involving the circulatory and respiratory systems: Secondary | ICD-10-CM | POA: Diagnosis not present

## 2022-04-04 DIAGNOSIS — Z79899 Other long term (current) drug therapy: Secondary | ICD-10-CM

## 2022-04-04 MED ORDER — VALSARTAN 40 MG PO TABS: 40.0000 mg | ORAL_TABLET | Freq: Every day | ORAL | 3 refills | Status: DC

## 2022-04-04 NOTE — Progress Notes (Signed)
? ? ? ? ? ?Patient Care Team: ?[name], MD as PCP - General (Family Medicine) ?[name], MD as PCP - Cardiology (Cardiology) ?[name], MD as PCP - Electrophysiology (Cardiology) ?[name], [facility] as Pharmacist (Pharmacist) ?Rebekah Chesterfield, LCSW as Education officer, museum (Licensed Holiday representative) ? ? ?HPI ? ?Jesus [name] is a 81 y.o. 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  He has a history of ischemic cardiomyopathy with remote CABG, valvular heart disease and remote TAVR.  Also has atrial fibrillation for which he was taking amiodarone; this was uptitrated in the setting of his ventricular tachycardia with its subsequent obliteration ? ?He is here with his sister in law ?  ?The patient denies chest pain, shortness of breath, nocturnal dyspnea, orthopnea  There have been no palpitations, lightheadedness or syncope.  Complains of some peripheral edema.  ? ? ?DATE TEST EF   ?6/*22 Echo  45-50% Prior IMI  ?3/13 LHC    % LIMA-LADp; SVG-OM1-OM2-OM3p ?SVG-RVM-p ?SVG-PDA 80%>Stent ?  ?3/23 Echo  30-35%   ?     ? ?Date Cr K Hgb TSH LFTs  ?12/22    4.32   ?4/23 1.33 4.8 12.5   22  ?        ?  ? ?Records and Results Reviewed  ? ?Past Medical History:  ?Diagnosis Date  ? Acute myocardial infarction of inferior wall ([place]) 1980  ? Aortic stenosis   ? mild with a mean aortic valve gradient of 12 mmHg  ? Atrial fibrillation ([place])   ? holding sinus rhythm on Amiodarone  ? CAD (coronary artery disease)   ? a. S/P Ant MI 1980;  b. 1997 S/P CABG x 8 (LIMA to diag-LAD, SVG to OM1-OM2-OM3, SVG to [facility] - Dr [name]);  c. 01/2015 Cath: 3VD, 8/8 patent grafts.  ? Cardiomyopathy   ? Dysrhythmia   ? Esophageal reflux   ? GERD (gastroesophageal reflux disease)   ? Headache   ? History of colonoscopy   ? History of transesophageal echocardiography (TEE) for monitoring   ? Other and unspecified  hyperlipidemia   ? Paroxysmal atrial fibrillation ([place]) 02/20/2022  ? S/P angioplasty with stent 02/17/22 DES to proximal VG to PDA 02/20/2022  ? S/P TAVR (transcatheter aortic valve replacement)   ? a. 03/2015 26 mm Edwards Sapien 3 transcatheter heart valve placed via open left transfemoral approach.  ? Severe aortic stenosis 08/10/2012  ? Skin lesions, generalized   ? facial which may represent actinic keratoses and possible photosensitivity from Amiodarone  ? Unspecified essential hypertension   ? ? ?Past Surgical History:  ?Procedure Laterality Date  ? CARDIAC CATHETERIZATION    ? CARDIOVERSION  11/18/2006  ? Dr. [name]  ? CATARACT EXTRACTION W/ INTRAOCULAR LENS IMPLANT  April '13  (Dr. [name])  ? left eye only  ? CHOLECYSTECTOMY    ? CORONARY ARTERY BYPASS GRAFT  02/09/1996  ? LIMA to diag-LAD, SVG to OM1-OM2-OM3, SVG to [facility]  ? CORONARY STENT INTERVENTION N/A 02/17/2022  ? Procedure: CORONARY STENT INTERVENTION;  Surgeon: Burnell Blanks, MD;  Location: [facility];  Service: Cardiovascular;  Laterality: N/A;  ? CORONARY/GRAFT ANGIOGRAPHY N/A 02/17/2022  ? Procedure: CORONARY/GRAFT ANGIOGRAPHY;  Surgeon: Burnell Blanks, MD;  Location: [place] [facility];  Service: Cardiovascular;  Laterality: N/A;  ? EYE SURGERY    ? LEFT AND RIGHT HEART CATHETERIZATION WITH CORONARY/GRAFT  ANGIOGRAM N/A 01/26/2015  ? Procedure: LEFT AND RIGHT HEART CATHETERIZATION WITH [name];  Surgeon: [name], MD;  Location[name] CATH LAB;  Service: Cardiovascular;  Laterality: N/A;  ? TEE WITHOUT CARDIOVERSION N/A 03/10/2015  ? Procedure: TRANSESOPHAGEAL ECHOCARDIOGRAM (TEE);  Surgeon: [name], MD;  Location: [place];  Service: Open Heart Surgery;  Laterality: N/A;  ? TONSILLECTOMY    ? TRANSCATHETER AORTIC VALVE REPLACEMENT, TRANSFEMORAL N/A 03/10/2015  ? Procedure: TRANSCATHETER AORTIC VALVE REPLACEMENT, TRANSFEMORAL;  Surgeon: [name], MD;  Location: [place];  Service: Open Heart  Surgery;  Laterality: N/A;  ? ? ?Current Meds  ?Medication Sig  ? acetaminophen (TYLENOL) 325 MG tablet Take 1-2 tablets (325-650 mg total) by mouth every 4 (four) hours as needed for mild pain.  ? albuterol (VENTOLIN HFA) 108 (90 Base) MCG/ACT inhaler INHALE 2 PUFFS BY MOUTH EVERY 4 HOURS ASNEEDED FOR WHEEZING OR SHORTNESS OF BREATH. (Patient taking differently: 2 puffs every 4 (four) hours as needed for wheezing or shortness of breath.)  ? amiodarone (PACERONE) 200 MG tablet Take 1 tablet (200 mg total) by mouth daily.  ? Ascorbic Acid (VITAMIN C) 1000 MG tablet Take 1,000 mg by mouth daily.  ? atorvastatin (LIPITOR) 80 MG tablet Take 1 tablet (80 mg total) by mouth daily.  ? clopidogrel (PLAVIX) 75 MG tablet Take 1 tablet (75 mg total) by mouth daily with breakfast.  ? diclofenac Sodium (VOLTAREN) 1 % GEL Apply 4 g topically 4 (four) times daily. (Patient taking differently: Apply 4 g topically 4 (four) times daily. As needed)  ? empagliflozin (JARDIANCE) 10 MG TABS tablet Take 1 tablet (10 mg total) by mouth daily.  ? furosemide (LASIX) 20 MG tablet Take 1 tablet (20 mg total) by mouth daily as needed. For swelling in the legs, shortness of breath or if your weight goes up by 2 lbs overnight. Call cardiology for input.  ? loratadine (CLARITIN) 10 MG tablet Take 10 mg by mouth daily as needed for allergies.  ? melatonin 5 MG TABS Take 1 tablet (5 mg total) by mouth at bedtime as needed (sleep).  ? methocarbamol (ROBAXIN) 500 MG tablet TAKE 1 TABLET BY MOUTH EVERY 6 HOURS AS NEEDED FOR MUSCLE SPASMS  ? Multiple Vitamins-Minerals (MULTIVITAMIN,TX-MINERALS) tablet Take 1 tablet by mouth daily.  ? nitroGLYCERIN (NITROSTAT) 0.4 MG SL tablet Place 1 tablet (0.4 mg total) under the tongue every 5 (five) minutes x 3 doses as needed for chest pain.  ? warfarin (COUMADIN) 4 MG tablet Take 1 tablet (4 mg total) by mouth every evening.  ? ? ?Allergies  ?Allergen Reactions  ? Amlodipine Other (See Comments)  ?  Swelling?  ?  Codeine Other (See Comments)  ?  Pt does not remember  ? Iodine Swelling  ? ? ? ? ?Review of Systems negative except from HPI and PMH ? ?Physical Exam ?BP (!) 146/64   Pulse (!) 56   Ht '5\' 1"'$  (1.549 m)   Wt 121 lb 6.4 oz (55.1 kg)   SpO2 96%   BMI 22.94 kg/m?  ?Well developed and well nourished in no acute distress ?HENT normal ?E scleral and icterus clear ?Neck Supple ?JVP flat; carotids brisk and full ?Crackles B ?Regular rate and rhythm, 5-9/5 systolic and 2/6 pandiastolic ?Soft with active bowel sounds ?No clubbing cyanosis 1+ Edema ?Alert and oriented, grossly normal motor and sensory function ?Skin Warm and Dry ? ?ECG sinus @ 56 ?19/12/48 ? ?Estimated Creatinine Clearance: 31.1 mL/min (A) (by C-G formula  based on SCr of 1.33 mg/dL (H)). ? ? ?Assessment and  Plan ?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-largely asymptomatic ?  ?Non-STEMI status post PCI (this hospitalization) ?  ?Ischemic cardiomyopathy with remote CABG ? ?Bilateral pulmonary crackles ?  ?Valvular heart disease-TAVR 2016 ?  ?Atrial fibrillation ?  ?Amiodarone ? ?Senile [place]  ? ?No intercurrent VT of which she is aware.  Currently taking amiodarone 200 mg a day and seemingly tolerating it well.  Needs a TSH for surveillance. ? ?No interval atrial fibrillation either of which he is aware.  We will continue Coumadin no bleeding except for superficial cutaneous bleeding ? ?Blood pressure is a little bit elevated.  His ejection fraction was mildly decreased in March.  Dr. [name] would like [place] to introduce valsartan 40 mg.  We will check a metabolic profile in 2 weeks time. ?  ?Lung exam is strikingly not normal.  Did not clear with coughing; chest x-rays were reviewed back through February 04, 2022 with only a single comment regarding edema. ? ?We will repeat a chest x-ray ? ? ? ?Current medicines are reviewed at length with the patient today .  The patient does not  have concerns regarding medicines.  ?

## 2022-04-04 NOTE — Patient Instructions (Signed)
Medication Instructions:  ?Your physician has recommended you make the following change in your medication:  ? ?** Begin Valsartan '40mg'$  - 1 tablet by mouth daily ? ?*If you need a refill on your cardiac medications before your next appointment, please call your pharmacy* ? ? ?Lab Work: ?TSH today and BMET in 7-10 days ? ?If you have labs (blood work) drawn today and your tests are completely normal, you will receive your results only by: ?MyChart Message (if you have MyChart) OR ?A paper copy in the mail ?If you have any lab test that is abnormal or we need to change your treatment, we will call you to review the results. ? ? ?Testing/Procedures: ?A chest x-ray takes a picture of the organs and structures inside the chest, including the heart, lungs, and blood vessels. This test can show several things, including, whether the heart is enlarges; whether fluid is building up in the lungs; and whether pacemaker / defibrillator leads are still in place.  ? ? ?Follow-Up: ?At [facility], you and your health needs are our priority.  As part of our continuing mission to provide you with exceptional heart care, we have created designated Provider Care Teams.  These Care Teams include your primary Cardiologist (physician) and Advanced Practice Providers (APPs -  Physician Assistants and Nurse Practitioners) who all work together to provide you with the care you need, when you need it. ? ?We recommend signing up for the patient portal called "MyChart".  Sign up information is provided on this After Visit Summary.  MyChart is used to connect with patients for Virtual Visits (Telemedicine).  Patients are able to view lab/test results, encounter notes, upcoming appointments, etc.  Non-urgent messages can be sent to your provider as well.   ?To learn more about what you can do with MyChart, go to [email or web address].   ? ?Your next appointment:   ?6 month(s) ? ?The format for your next appointment:   ?In  Person ? ?Provider:   ?You may see [name], MD or one of the following Advanced Practice Providers on your designated Care Team:   ?Tommye Standard, PA-C ?Legrand Como "Oda Kilts, PA-C{ ? ? ?Important Information About [place] ? ? ? ? ?  ?

## 2022-04-05 ENCOUNTER — Telehealth: Payer: Self-pay

## 2022-04-05 ENCOUNTER — Telehealth (INDEPENDENT_AMBULATORY_CARE_PROVIDER_SITE_OTHER): Payer: Medicare Other | Admitting: Family Medicine

## 2022-04-05 ENCOUNTER — Encounter: Payer: Self-pay | Admitting: Family Medicine

## 2022-04-05 ENCOUNTER — Ambulatory Visit: Payer: Medicare Other

## 2022-04-05 VITALS — Ht 61.0 in | Wt 121.4 lb

## 2022-04-05 DIAGNOSIS — I214 Non-ST elevation (NSTEMI) myocardial infarction: Secondary | ICD-10-CM

## 2022-04-05 LAB — TSH: TSH: 2.65 u[IU]/mL (ref 0.450–4.500)

## 2022-04-05 NOTE — Patient Instructions (Signed)
Visit Information ? ?Thank you for taking time to visit with me today. Please don't hesitate to contact me if I can be of assistance to you before our next scheduled telephone appointment. ? ?Following are the goals we discussed today:  ?Patient Goals/Self-Care Activities: Over the next 120 days ?Attend scheduled appointments with providers ?Utilize healthy coping skills and/or resources discussed ?Contact PCP office with any questions or concerns ? ?Our next appointment is by telephone on 04/29/22 at 10:45 AM ? ?Please call the care guide team at [phone] if you need to cancel or reschedule your appointment.  ? ?If you are experiencing a Mental Health or [place] or need someone to talk to, please call the Suicide and Crisis Lifeline: 988 ?call 911  ? ?The patient verbalized understanding of instructions, educational materials, and care plan provided today and declined offer to receive copy of patient instructions, educational materials, and care plan.  ? ?Christa See, MSW, LCSW ?[facility] ?[facility] ?[email or web address] ?Phone [phone] ?8:00 AM ? ?

## 2022-04-05 NOTE — Telephone Encounter (Signed)
-----   Message from [name], MD sent at 04/05/2022  8:05 AM EDT ----- ?Offer follow up- virtual or in office if he would like to discuss   ?----- Message ----- ?From: Rebekah Chesterfield, LCSW ?Sent: 04/05/2022   8:02 AM EDT ?To: [name], MD ? ?Hi Dr. Elease Hashimoto, pt endorses nightmares from medication changes and would like to discuss with you. Please advise. Thanks! ? ?

## 2022-04-05 NOTE — Chronic Care Management (AMB) (Signed)
?Chronic Care Management  ? ? Clinical Social Work Note ? ?04/05/2022 ?Name: Jesus [name] MRN: [id number] DOB: [birth year] ? ?Jesus [name] is a 81 y.o. year old male who is a primary care patient of [name], [name], MD. The CCM team was consulted to assist the patient with chronic disease management and/or care coordination needs related to: [employer]  and [employer].  ? ?Engaged with patient by telephone for follow up visit in response to provider referral for social work chronic care management and care coordination services.  ? ?Consent to Services:  ?The patient was given information about Chronic Care Management services, agreed to services, and gave verbal consent prior to initiation of services.  Please see initial visit note for detailed documentation.  ? ?Patient agreed to services and consent obtained.  ? ?Summary:  Patient continues to maintain positive progress with care plan goals. Endorses nightmares as a side effect to medication changes and would like to schedule f/up with PCP to address.  See Care Plan below for interventions and patient self-care actives. ? ? ?SDOH (Social Determinants of Health) assessments and interventions performed:   ? ?Advanced Directives Status: Not addressed in this encounter. ? ?CCM Care Plan ? ?Allergies  ?Allergen Reactions  ? Amlodipine Other (See Comments)  ?  Swelling?  ? Codeine Other (See Comments)  ?  Pt does not remember  ? Iodine Swelling  ? ? ?Outpatient Encounter Medications as of 03/31/2022  ?Medication Sig  ? acetaminophen (TYLENOL) 325 MG tablet Take 1-2 tablets (325-650 mg total) by mouth every 4 (four) hours as needed for mild pain.  ? albuterol (VENTOLIN HFA) 108 (90 Base) MCG/ACT inhaler INHALE 2 PUFFS BY MOUTH EVERY 4 HOURS ASNEEDED FOR WHEEZING OR SHORTNESS OF BREATH. (Patient taking differently: 2 puffs every 4 (four) hours as needed for wheezing or shortness of breath.)  ? amiodarone (PACERONE) 200 MG tablet  Take 1 tablet (200 mg total) by mouth daily.  ? Ascorbic Acid (VITAMIN C) 1000 MG tablet Take 1,000 mg by mouth daily.  ? atorvastatin (LIPITOR) 80 MG tablet Take 1 tablet (80 mg total) by mouth daily.  ? clopidogrel (PLAVIX) 75 MG tablet Take 1 tablet (75 mg total) by mouth daily with breakfast.  ? diclofenac Sodium (VOLTAREN) 1 % GEL Apply 4 g topically 4 (four) times daily. (Patient taking differently: Apply 4 g topically 4 (four) times daily. As needed)  ? empagliflozin (JARDIANCE) 10 MG TABS tablet Take 1 tablet (10 mg total) by mouth daily.  ? furosemide (LASIX) 20 MG tablet Take 1 tablet (20 mg total) by mouth daily as needed. For swelling in the legs, shortness of breath or if your weight goes up by 2 lbs overnight. Call cardiology for input.  ? loratadine (CLARITIN) 10 MG tablet Take 10 mg by mouth daily as needed for allergies.  ? melatonin 5 MG TABS Take 1 tablet (5 mg total) by mouth at bedtime as needed (sleep).  ? methocarbamol (ROBAXIN) 500 MG tablet TAKE 1 TABLET BY MOUTH EVERY 6 HOURS AS NEEDED FOR MUSCLE SPASMS  ? Multiple Vitamins-Minerals (MULTIVITAMIN,TX-MINERALS) tablet Take 1 tablet by mouth daily.  ? nitroGLYCERIN (NITROSTAT) 0.4 MG SL tablet Place 1 tablet (0.4 mg total) under the tongue every 5 (five) minutes x 3 doses as needed for chest pain.  ? warfarin (COUMADIN) 4 MG tablet Take 1 tablet (4 mg total) by mouth every evening.  ? ?No facility-administered encounter medications on file as of 03/31/2022.  ? ? ?  Patient Active Problem List  ? Diagnosis Date Noted  ? A-fib ([place]) 04/01/2022  ? Bradycardia 03/15/2022  ? Debility 03/08/2022  ? 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 Acute on chronic systolic congestive heart failure ([place]) 02/27/2022  ? Paroxysmal atrial fibrillation ([place]) 02/20/2022  ? S/P angioplasty with stent 02/17/22 DES to proximal VG to PDA 02/20/2022  ? NSTEMI (non-ST elevated myocardial infarction) ([place]) 02/15/2022  ? Chronic systolic  CHF (congestive heart failure) ([place]) 04/16/2018  ? Long term (current) use of anticoagulants 08/25/2017  ? Actinic keratoses 03/21/2017  ? Internal hemorrhoid, bleeding 07/01/2015  ? Rectal bleeding 06/15/2015  ? Internal hemorrhoids 06/15/2015  ? Other constipation 06/15/2015  ? Chronic anticoagulation-Couamdin 03/12/2015  ? 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  ? Iron deficiency anemia 09/03/2014  ? Encounter for therapeutic drug monitoring 12/31/2013  ? Yellow jacket sting 08/06/2013  ? Low back pain on right side with sciatica 04/24/2013  ? Severe aortic stenosis 08/10/2012  ? Coronary artery disease involving coronary bypass graft of native heart without angina pectoris 08/10/2012  ? Left ventricular dysfunction 08/10/2012  ? Routine health maintenance 07/12/2012  ? BEE STING REACTION, LOCAL 09/21/2010  ? BEN LOC HYPERPLASIA PROS W/UR OBST & OTH LUTS 08/02/2010  ? Acute and chronic cholecystitis 02/01/2010  ? Abdominal pain, generalized 01/29/2010  ? CORONARY ATHEROSCLEROSIS NATIVE CORONARY ARTERY 10/13/2009  ? HYPERTHYROIDISM 05/22/2009  ? INSOMNIA 05/22/2009  ? Mixed hyperlipidemia 04/22/2009  ? Cardiomyopathy, ischemic-EF 35% 04/22/2009  ? CAD, AUTOLOGOUS BYPASS GRAFT 10/16/2008  ? Essential hypertension 09/07/2007  ? Aortic valve disorder 09/07/2007  ? GERD without esophagitis 09/07/2007  ? 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  ? ? ?Conditions to be addressed/monitored: Atrial Fibrillation and CHF ? ?Care Plan : LCSW Plan of Care  ?Updates made by Rebekah Chesterfield, LCSW since 04/05/2022 12:00 AM  ?  ? ?Problem: Coping Skills (General Plan of Care)   ?  ? ?Long-Range Goal: Coping Skills Enhanced   ?Start Date: 11/30/2021  ?Expected End Date: 07/04/2022  ?This Visit's Progress: On track  ?Recent Progress: On track  ?Priority: High  ?Note:   ?Current barriers:   ?Acute Mental Health needs related to Grief ?Needs Support, Education, and Care Coordination in order to meet unmet mental health  needs. ?Clinical Goal(s): patient will work with SW to address concerns related to Grief management   ?Clinical Interventions:  ?Assessed patient's previous and current treatment, coping skills, support system and barriers to care  ?Pt endorsees difficulty sleeping due to nightmares believed to be a side effect from med changes (April 11, 23) Pt would like to speak with PCP due to negatively impacting functioning throughout the day ?Pt shared his nieces continue to provide transportation ?LCSW reviewed upcoming appts ?Patient reports walking for exercising and spending time outside to promote relaxation and a positive mood ?Patient receives support from sister in law and co-workers. He is a member to multiple organizations  ?Patient denies any resource needs ?Pt was successful in identifying healthy coping skills to assist with promoting mood (watching tv and reading scripture) ?Mindfulness or Relaxation training provided ?Active listening / Reflection utilized  ?Emotional Support Provided ?Provided psychoeducation for mental health needs  ?Quality of sleep assessed & Sleep Hygiene techniques promoted  ?Participation in counseling encouraged  ?Participation in support group encouraged  ?Verbalization of feelings encouraged  ; ?Inter-disciplinary care team collaboration (see longitudinal plan of care) ?Patient Goals/Self-Care Activities: Over the next 120 days ?Attend scheduled  appointments with providers ?Utilize healthy coping skills and/or resources discussed ?Contact PCP office with any questions or concerns ? ?  ?  ? ?Follow Up Plan: Appointment scheduled for SW follow up with client by phone on: 04/29/22 ? ?Christa See, MSW, LCSW ?[facility] ?[place] [facility] ?[email or web address] ?Phone [phone] ?7:57 AM ? ? ? ?

## 2022-04-05 NOTE — Telephone Encounter (Signed)
Pt has been added to schedule for today ?

## 2022-04-05 NOTE — Progress Notes (Signed)
Patient ID: Jesus [name], male   DOB: 20-Jul-1941, 81 y.o.   MRN: [id number] ? ?This visit type was conducted due to national recommendations for restrictions regarding the COVID-19 pandemic in an effort to limit this patient's exposure and mitigate transmission in our community.  ? ?Virtual Visit via Telephone Note ? ?I connected with [name] on 04/05/22 at  4:30 PM EDT by telephone and verified that I am speaking with the correct person using two identifiers. ?  ?I discussed the limitations, risks, security and privacy concerns of performing an evaluation and management service by telephone and the availability of in person appointments. I also discussed with the patient that there may be a patient responsible charge related to this service. The patient expressed understanding and agreed to proceed. ? ?Location patient: home ?Location provider: work or home office ?Participants present for the call: patient, provider ?Patient did not have a visit in the prior 7 days to address this/these issue(s). ? ? ?History of Present Illness: ?Jesus [name] called this morning or actually left a message that he has had some recent nightmares.  He was concerned that one of his newer medications was causing that.  He recently had dosage reduction in amiodarone.  He has not had any bad dreams for the past 2 nights.  He had expressed desire to discuss medication list to see which medications could be related.  Recently was prescribed valsartan but has not yet started.  He had thyroid assessment yesterday with TSH which was normal.  Chest x-ray showed no acute findings.  Feels well at this time.  No dizziness.  No syncope.  No chest pain.  He is still waiting to start cardiac rehab. ? ?He did have questions about driving short distances-for example within 10 miles of the house.  He hopes to be able to drive to cardiac rehab.  No recent seizures.  No altered consciousness.  No confusion. ? ?Past Medical History:  ?Diagnosis Date  ?  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 Aortic stenosis   ? mild with a mean aortic valve gradient of 12 mmHg  ? Atrial fibrillation ([place])   ? holding sinus rhythm on Amiodarone  ? CAD (coronary artery disease)   ? a. S/P Ant MI 1980;  b. 1997 S/P CABG x 8 (LIMA to diag-LAD, SVG to OM1-OM2-OM3, SVG to [facility] - Dr [name]);  c. 01/2015 Cath: 3VD, 8/8 patent grafts.  ? Cardiomyopathy   ? Dysrhythmia   ? Esophageal reflux   ? GERD (gastroesophageal reflux disease)   ? Headache   ? History of colonoscopy   ? History of transesophageal echocardiography (TEE) for monitoring   ? Other and unspecified hyperlipidemia   ? Paroxysmal atrial fibrillation ([place]) 02/20/2022  ? S/P angioplasty with stent 02/17/22 DES to proximal VG to PDA 02/20/2022  ? S/P TAVR (transcatheter aortic valve replacement)   ? a. 03/2015 26 mm Edwards Sapien 3 transcatheter heart valve placed via open left transfemoral approach.  ? Severe aortic stenosis 08/10/2012  ? Skin lesions, generalized   ? facial which may represent actinic keratoses and possible photosensitivity from Amiodarone  ? Unspecified essential hypertension   ? ?Past Surgical History:  ?Procedure Laterality Date  ? CARDIAC CATHETERIZATION    ? CARDIOVERSION  11/18/2006  ? Dr. [name]  ? CATARACT EXTRACTION W/ INTRAOCULAR LENS IMPLANT  April '13  (Dr. [name])  ? left eye only  ? CHOLECYSTECTOMY    ? CORONARY ARTERY BYPASS GRAFT  02/09/1996  ?  LIMA to diag-LAD, SVG to OM1-OM2-OM3, SVG to [facility]  ? CORONARY STENT INTERVENTION N/A 02/17/2022  ? Procedure: CORONARY STENT INTERVENTION;  Surgeon: Burnell Blanks, MD;  Location: [facility];  Service: Cardiovascular;  Laterality: N/A;  ? CORONARY/GRAFT ANGIOGRAPHY N/A 02/17/2022  ? Procedure: CORONARY/GRAFT ANGIOGRAPHY;  Surgeon: Burnell Blanks, MD;  Location: [place];  Service: Cardiovascular;  Laterality: N/A;  ? EYE SURGERY    ? LEFT AND RIGHT HEART CATHETERIZATION WITH CORONARY/GRAFT  ANGIOGRAM N/A 01/26/2015  ? Procedure: LEFT AND RIGHT HEART CATHETERIZATION WITH [name];  Surgeon: [name], MD;  Location: [facility] CATH LAB;  Service: Cardiovascular;  Laterality: N/A;  ? TEE WITHOUT CARDIOVERSION N/A 03/10/2015  ? Procedure: TRANSESOPHAGEAL ECHOCARDIOGRAM (TEE);  Surgeon: [name], MD;  Location: [place];  Service: Open Heart Surgery;  Laterality: N/A;  ? TONSILLECTOMY    ? TRANSCATHETER AORTIC VALVE REPLACEMENT, TRANSFEMORAL N/A 03/10/2015  ? Procedure: TRANSCATHETER AORTIC VALVE REPLACEMENT, TRANSFEMORAL;  Surgeon: [name], MD;  Location: [place];  Service: Open Heart Surgery;  Laterality: N/A;  ? ? reports that he has never smoked. He has never used smokeless tobacco. He reports that he does not drink alcohol and does not use drugs. ?family history includes Atrial fibrillation in his brother; Crohn's disease in his mother; Heart attack (age of onset: 67) in his father; Heart disease in his father and paternal uncle; Heart failure in his father; Prostate cancer in his paternal uncle; Stroke (age of onset: 15) in his mother. ?Allergies  ?Allergen Reactions  ? Amlodipine Other (See Comments)  ?  Swelling?  ? Codeine Other (See Comments)  ?  Pt does not remember  ? Iodine Swelling  ? ? ?  ?Observations/Objective: ?Patient sounds cheerful and well on the phone. ?I do not appreciate any SOB. ?Speech and thought processing are grossly intact. ?Patient reported vitals: ? 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  Overall appears to be stable at this time.  Had some recent bad dreams which certainly may have been related to amiodarone.  No hallucinations.  No confusion. ? ?-We explained that we do not see any absolute reason he cannot drive short distances at this time.  No recent seizure.  No acute confusion.  No major cognitive impairment. ? ?Follow Up  Instructions: ? ? ? ?08811 5-10 ?99442 11-20 ?99443 21-30 ?I did not refer this patient for an OV in the next 24 hours for this/these issue(s). ? ?I discussed the assessment and treatment plan with the patient. The patient was provided an opportunity to ask questions and all were answered. The patient agreed with the plan and demonstrated an understanding of the instructions. ?  ?The patient was advised to call back or seek an in-person evaluation if the symptoms worsen or if the condition fails to improve as anticipated. ? ?I provided 18 minutes of non-face-to-face time during this encounter. ? ? ?[name], MD  ? ?

## 2022-04-07 ENCOUNTER — Telehealth: Payer: Self-pay

## 2022-04-07 NOTE — Telephone Encounter (Signed)
Attempted phone call to pt to advised of recent lab results per Dr Caryl Comes.  Left voicemail message to contact office at [phone] ? ? ? ?Please Inform Patient that labs are normal  ?Thanks ? ?[name], MD  ?04/05/2022 10:39 AM ED ?

## 2022-04-08 ENCOUNTER — Ambulatory Visit: Payer: Medicare Other

## 2022-04-08 DIAGNOSIS — Z7901 Long term (current) use of anticoagulants: Secondary | ICD-10-CM

## 2022-04-08 LAB — POCT INR: INR: 1.6 — AB (ref 2.0–3.0)

## 2022-04-08 NOTE — Patient Instructions (Addendum)
Pre visit review using our clinic review tool, if applicable. No additional management support is needed unless otherwise documented below in the visit note. ? ?Increase dose today to take 1 1/2 tablets and increase dose tomorrow to  take 1 1/2 tablets and then change weekly dose to take 1 tablet daily except take 1/2 tablet on Thursdays. Recheck in 1 week at [facility], at [address] [place]. Contact the coumadin clinic at [phone] if any concerns. If any bleeding go to the ER or contact the office.  ?

## 2022-04-08 NOTE — Progress Notes (Signed)
Increase dose today to take 1 1/2 tablets and increase dose tomorrow to  take 1 1/2 tablets and then change weekly dose to take 1 tablet daily except take 1/2 tablet on Thursdays. Recheck in 1 week at [facility], at [address], [place]. Contact the coumadin clinic at [phone] if any concerns. If any bleeding go to the ER or contact the office.  ? ?Pt is tracking his BP daily for cardiology and asked to have his BP taken at visit today. BP 122/58 and HR 56, O2 sat 96% ?

## 2022-04-11 ENCOUNTER — Other Ambulatory Visit: Payer: Medicare Other | Admitting: *Deleted

## 2022-04-11 DIAGNOSIS — I5022 Chronic systolic (congestive) heart failure: Secondary | ICD-10-CM | POA: Diagnosis not present

## 2022-04-11 DIAGNOSIS — I4819 Other persistent atrial fibrillation: Secondary | ICD-10-CM | POA: Diagnosis not present

## 2022-04-11 DIAGNOSIS — Z79899 Other long term (current) drug therapy: Secondary | ICD-10-CM

## 2022-04-12 ENCOUNTER — Other Ambulatory Visit: Payer: Self-pay | Admitting: Physical Medicine and Rehabilitation

## 2022-04-12 LAB — BASIC METABOLIC PANEL
BUN/Creatinine Ratio: 15 (ref 10–24)
BUN: 21 mg/dL (ref 8–27)
CO2: 23 mmol/L (ref 20–29)
Calcium: 9 mg/dL (ref 8.6–10.2)
Chloride: 104 mmol/L (ref 96–106)
Creatinine, Ser: 1.43 mg/dL — ABNORMAL HIGH (ref 0.76–1.27)
Glucose: 113 mg/dL — ABNORMAL HIGH (ref 70–99)
Potassium: 4.8 mmol/L (ref 3.5–5.2)
Sodium: 141 mmol/L (ref 134–144)
eGFR: 49 mL/min/{1.73_m2} — ABNORMAL LOW (ref >59)

## 2022-04-13 NOTE — Telephone Encounter (Signed)
Pt returning phone call and advised per Dr Caryl Comes TSH and chest Xray were both normal.  Pt advised results have been mailed to his home address.  Pt verbalizes understanding and thanked Therapist, sports for the call. ?

## 2022-04-13 NOTE — Telephone Encounter (Signed)
3rd phone call attempt to pt and left voicemail message to contact RN at [phone]. Letter and results mailed to address on file re: lab results  See letter for complete details. ?

## 2022-04-19 ENCOUNTER — Ambulatory Visit: Payer: Medicare Other

## 2022-04-19 DIAGNOSIS — Z7901 Long term (current) use of anticoagulants: Secondary | ICD-10-CM | POA: Diagnosis not present

## 2022-04-19 LAB — POCT INR: INR: 3.4 — AB (ref 2.0–3.0)

## 2022-04-19 NOTE — Progress Notes (Signed)
Hold dose today and then change weekly dose to take 1 tablet daily except take 1/2 tablet on Mondays and Thursdays. ?

## 2022-04-19 NOTE — Patient Instructions (Addendum)
Pre visit review using our clinic review tool, if applicable. No additional management support is needed unless otherwise documented below in the visit note. ? ?Hold dose today and then change weekly dose to take 1 tablet daily except take 1/2 tablet on Mondays and Thursdays. ?Recheck in 1 week at [facility], at [address], [place]. Contact the coumadin clinic at [phone] if any concerns. If any bleeding go to the ER or contact the office.  ?

## 2022-04-20 DIAGNOSIS — I110 Hypertensive heart disease with heart failure: Secondary | ICD-10-CM | POA: Diagnosis not present

## 2022-04-20 DIAGNOSIS — M1990 Unspecified osteoarthritis, unspecified site: Secondary | ICD-10-CM | POA: Diagnosis not present

## 2022-04-20 DIAGNOSIS — Z955 Presence of coronary angioplasty implant and graft: Secondary | ICD-10-CM | POA: Diagnosis not present

## 2022-04-20 DIAGNOSIS — I509 Heart failure, unspecified: Secondary | ICD-10-CM | POA: Diagnosis not present

## 2022-04-21 ENCOUNTER — Telehealth: Payer: Self-pay | Admitting: Pharmacist

## 2022-04-21 NOTE — Chronic Care Management (AMB) (Signed)
Chronic Care Management Pharmacy Assistant   Name: Jesus [name]  MRN: [id number] DOB: 16-Aug-1941  Reason for Encounter: Disease State / General Assessment Call   Conditions to be addressed/monitored: HTN, HLD, and GERD  Recent office visits:  04/05/2022 [name] MD - Patient was seen for NSTEMI (non-ST elevated myocardial infarction). No medication changes. No follow up noted.   03/28/2022 [name] MD - Patient was seen for Acute on chronic systolic heart failure and additional issues. No medication changes. Follow up in 3 months.  Recent consult visits:  04/19/2022 [name] RN - Anti-coag visit.   04/08/2022 [name] RN - Anti-coag visit.  04/04/2022 [name] MD (cardiology) - Patient was seen for abnormal lung sounds and additional issues. Started Valsartan 40 mg daily. Follow up in 6 months.   04/01/2022 [name] RN - Anti-coag visit.  03/29/2022 [name] NP (heart care) - Patient was seen for persistent atrial fibrillation and additional issues. Started Valsartan 40 mg. Follow up in 1 week.   03/28/2022 [name] RN - Anti-coag visit.  Hospital visits:  None  Medications: Outpatient Encounter Medications as of 04/21/2022  Medication Sig   acetaminophen (TYLENOL) 325 MG tablet Take 1-2 tablets (325-650 mg total) by mouth every 4 (four) hours as needed for mild pain.   albuterol (VENTOLIN HFA) 108 (90 Base) MCG/ACT inhaler INHALE 2 PUFFS BY MOUTH EVERY 4 HOURS ASNEEDED FOR WHEEZING OR SHORTNESS OF BREATH. (Patient taking differently: 2 puffs every 4 (four) hours as needed for wheezing or shortness of breath.)   amiodarone (PACERONE) 200 MG tablet Take 1 tablet (200 mg total) by mouth daily.   Ascorbic Acid (VITAMIN C) 1000 MG tablet Take 1,000 mg by mouth daily.   atorvastatin (LIPITOR) 80 MG tablet Take 1 tablet (80 mg total) by mouth daily.   clopidogrel (PLAVIX) 75 MG tablet Take 1 tablet (75 mg total) by mouth  daily with breakfast.   diclofenac Sodium (VOLTAREN) 1 % GEL Apply 4 g topically 4 (four) times daily. (Patient taking differently: Apply 4 g topically 4 (four) times daily. As needed)   empagliflozin (JARDIANCE) 10 MG TABS tablet Take 1 tablet (10 mg total) by mouth daily.   furosemide (LASIX) 20 MG tablet Take 1 tablet (20 mg total) by mouth daily as needed. For swelling in the legs, shortness of breath or if your weight goes up by 2 lbs overnight. Call cardiology for input.   loratadine (CLARITIN) 10 MG tablet Take 10 mg by mouth daily as needed for allergies.   melatonin 5 MG TABS Take 1 tablet (5 mg total) by mouth at bedtime as needed (sleep).   methocarbamol (ROBAXIN) 500 MG tablet TAKE 1 TABLET BY MOUTH EVERY 6 HOURS AS NEEDED FOR MUSCLE SPASMS   Multiple Vitamins-Minerals (MULTIVITAMIN,TX-MINERALS) tablet Take 1 tablet by mouth daily.   nitroGLYCERIN (NITROSTAT) 0.4 MG SL tablet Place 1 tablet (0.4 mg total) under the tongue every 5 (five) minutes x 3 doses as needed for chest pain.   valsartan (DIOVAN) 40 MG tablet Take 1 tablet (40 mg total) by mouth daily.   warfarin (COUMADIN) 4 MG tablet Take 1 tablet (4 mg total) by mouth every evening.   No facility-administered encounter medications on file as of 04/21/2022.  Fill History: ALBUTEROL SULFATE HFA  108 (90 Base) MCG/ACT AERS 01/27/2021 30   AMIODARONE HYDROCHLORIDE  200 MG TABS 04/01/2022 40   ATORVASTATIN 80 MG TABLET 03/19/2022 30   CLOPIDOGREL 75 MG TABLET 04/07/2022 30  JARDIANCE  10 MG TABS 03/15/2022 30   FUROSEMIDE  20 MG TABS 03/15/2022 30   NITROGLYCERIN 0.4 MG TABLET SL 02/20/2022 5   VALSARTAN  40 MG TABS 04/04/2022 90   WARFARIN SODIUM  4 MG TABS 01/10/2022 100   Contacted [name] for General Review Call   Chart Review:  Have there been any documented new, changed, or discontinued medications since last visit? No new medication changes since last visit with Dr Elease Hashimoto.  Has there been any  documented recent hospitalizations or ED visits since last visit with Clinical Pharmacist? No recent hospital visits.   Adherence Review:  Does the Clinical Pharmacist Assistant have access to adherence rates?  Adherence rates for STAR metric medications (List medication(s)/day supply/ last 2 fill dates). Adherence rates for medications indicated for disease state being reviewed (List medication(s)/day supply/ last 2 fill dates). Does the patient have >5 day gap between last estimated fill dates for any of the above medications or other medication gaps? No Reason for medication gaps.   Disease State Questions:  Able to connect with Patient? Yes Did patient have any problems with their health recently? Patient denies any heath issues.   Have you had any admissions or emergency room visits or worsening of your condition(s) since last visit? Patient denies any recent ED visits. Recent admission on 03/08/2022 for debility  Have you had any visits with new specialists or providers since your last visit? Patient denies any new providers.   Have you had any new health care problem(s) since your last visit? Patient denies any new health care problems.   Have you run out of any of your medications since you last spoke with clinical pharmacist? Patient states he has not run out of any medications.   Are there any medications you are not taking as prescribed? Patient denies  Are you having any issues or side effects with your medications? Patient denies any issues with his medications.  Do you have any other health concerns or questions you want to discuss with your Clinical Pharmacist before your next visit? Patient denies.   Are there any health concerns that you feel we can do a better job addressing? Patient denies.    11. Any falls since last visit? Patient denies   31. Any increased or uncontrolled pain since last visit? Patient denies   70. Additional Details? Patient denies any  problems at this time, everything is good except keeping his coumadin in good range.     Care Gaps: AWV - previous message sent to Ramond Craver Last BP - 146/64 on 04/04/2022 Covid booster - overdue  Star Rating Drugs: Atorvastatin 80 mg - last filled 03/19/2022 30 DS at [facility] Jardiance 10 mg  - last filled 03/15/2022 30 DS at [facility] Valsartan 40 mg - last filled 04/04/2022 90 DS at [facility] Pharmacist Assistant [phone]

## 2022-04-22 DIAGNOSIS — M1990 Unspecified osteoarthritis, unspecified site: Secondary | ICD-10-CM | POA: Diagnosis not present

## 2022-04-22 DIAGNOSIS — Z955 Presence of coronary angioplasty implant and graft: Secondary | ICD-10-CM | POA: Diagnosis not present

## 2022-04-22 DIAGNOSIS — I110 Hypertensive heart disease with heart failure: Secondary | ICD-10-CM | POA: Diagnosis not present

## 2022-04-22 DIAGNOSIS — I509 Heart failure, unspecified: Secondary | ICD-10-CM | POA: Diagnosis not present

## 2022-04-25 DIAGNOSIS — I110 Hypertensive heart disease with heart failure: Secondary | ICD-10-CM | POA: Diagnosis not present

## 2022-04-25 DIAGNOSIS — Z955 Presence of coronary angioplasty implant and graft: Secondary | ICD-10-CM | POA: Diagnosis not present

## 2022-04-25 DIAGNOSIS — M1990 Unspecified osteoarthritis, unspecified site: Secondary | ICD-10-CM | POA: Diagnosis not present

## 2022-04-25 DIAGNOSIS — I509 Heart failure, unspecified: Secondary | ICD-10-CM | POA: Diagnosis not present

## 2022-04-26 ENCOUNTER — Telehealth: Payer: Self-pay

## 2022-04-26 ENCOUNTER — Ambulatory Visit: Payer: Medicare Other

## 2022-04-26 DIAGNOSIS — Z7901 Long term (current) use of anticoagulants: Secondary | ICD-10-CM

## 2022-04-26 LAB — POCT INR: INR: 2.8 (ref 2.0–3.0)

## 2022-04-26 MED ORDER — WARFARIN SODIUM 4 MG PO TABS: ORAL_TABLET | ORAL | 2 refills | Status: DC

## 2022-04-26 NOTE — Progress Notes (Addendum)
Continue 1 tablet daily except take 1/2 tablet on Mondays and Thursdays. Recheck in 2 week at [facility], at [address] [place]. Contact the coumadin clinic at [phone] if any concerns.   Pt requested BP checked while here. He was asked by cardiology to keep up with daily checks. BP 116/62, HR 52, O2sat 97%. These are consistent with last readings in office.   Pt also requested refill of warfarin. Sent in refill.

## 2022-04-26 NOTE — Patient Instructions (Addendum)
Pre visit review using our clinic review tool, if applicable. No additional management support is needed unless otherwise documented below in the visit note.  Continue 1 tablet daily except take 1/2 tablet on Mondays and Thursdays. Recheck in 2 week at [facility], at [address], [place]. Contact the coumadin clinic at [phone] if any concerns.

## 2022-04-26 NOTE — Telephone Encounter (Signed)
Spoke with pt and advised per Dr Mick Sell are normal with stable kidney function.  Pt verbalizes understanding and thanked Therapist, sports for the call.

## 2022-04-26 NOTE — Telephone Encounter (Signed)
-----   Message from [name], MD sent at 04/23/2022  6:13 AM EDT ----- Please Inform Patient  Labs are normal x stable mild renal issues   Thanks

## 2022-04-27 DIAGNOSIS — I110 Hypertensive heart disease with heart failure: Secondary | ICD-10-CM | POA: Diagnosis not present

## 2022-04-27 DIAGNOSIS — Z955 Presence of coronary angioplasty implant and graft: Secondary | ICD-10-CM | POA: Diagnosis not present

## 2022-04-27 DIAGNOSIS — I509 Heart failure, unspecified: Secondary | ICD-10-CM | POA: Diagnosis not present

## 2022-04-27 DIAGNOSIS — M1990 Unspecified osteoarthritis, unspecified site: Secondary | ICD-10-CM | POA: Diagnosis not present

## 2022-04-28 ENCOUNTER — Other Ambulatory Visit (HOSPITAL_COMMUNITY): Payer: Medicare Other

## 2022-04-28 DIAGNOSIS — S51811A Laceration without foreign body of right forearm, initial encounter: Secondary | ICD-10-CM | POA: Diagnosis not present

## 2022-04-29 ENCOUNTER — Ambulatory Visit (INDEPENDENT_AMBULATORY_CARE_PROVIDER_SITE_OTHER): Payer: Medicare Other | Admitting: Licensed Clinical Social Worker

## 2022-04-29 DIAGNOSIS — I509 Heart failure, unspecified: Secondary | ICD-10-CM | POA: Diagnosis not present

## 2022-04-29 DIAGNOSIS — M1990 Unspecified osteoarthritis, unspecified site: Secondary | ICD-10-CM | POA: Diagnosis not present

## 2022-04-29 DIAGNOSIS — G4700 Insomnia, unspecified: Secondary | ICD-10-CM

## 2022-04-29 DIAGNOSIS — I10 Essential (primary) hypertension: Secondary | ICD-10-CM

## 2022-04-29 DIAGNOSIS — I482 Chronic atrial fibrillation, unspecified: Secondary | ICD-10-CM

## 2022-04-29 DIAGNOSIS — I110 Hypertensive heart disease with heart failure: Secondary | ICD-10-CM | POA: Diagnosis not present

## 2022-04-29 DIAGNOSIS — Z955 Presence of coronary angioplasty implant and graft: Secondary | ICD-10-CM | POA: Diagnosis not present

## 2022-04-29 NOTE — Chronic Care Management (AMB) (Signed)
Chronic Care Management    Clinical Social Work Note  04/29/2022 Name: Jesus [name] MRN: [id number] DOB: 1941-06-14  Jesus [name] is a 81 y.o. year old male who is a primary care patient of [name], [name], MD. The CCM team was consulted to assist the patient with chronic disease management and/or care coordination needs related to: Mental Health Counseling and Resources and Grief Counseling.   Engaged with patient by telephone for follow up visit in response to provider referral for social work chronic care management and care coordination services.   Consent to Services:  The patient was given information about Chronic Care Management services, agreed to services, and gave verbal consent prior to initiation of services.  Please see initial visit note for detailed documentation.   Patient agreed to services and consent obtained.   Assessment: Review of patient past medical history, allergies, medications, and health status, including review of relevant consultants reports was performed today as part of a comprehensive evaluation and provision of chronic care management and care coordination services.     SDOH (Social Determinants of Health) assessments and interventions performed:    Advanced Directives Status: Not addressed in this encounter.  CCM Care Plan  Allergies  Allergen Reactions   Amlodipine Other (See Comments)    Swelling?   Codeine Other (See Comments)    Pt does not remember   Iodine Swelling    Outpatient Encounter Medications as of 04/29/2022  Medication Sig   acetaminophen (TYLENOL) 325 MG tablet Take 1-2 tablets (325-650 mg total) by mouth every 4 (four) hours as needed for mild pain.   albuterol (VENTOLIN HFA) 108 (90 Base) MCG/ACT inhaler INHALE 2 PUFFS BY MOUTH EVERY 4 HOURS ASNEEDED FOR WHEEZING OR SHORTNESS OF BREATH. (Patient taking differently: 2 puffs every 4 (four) hours as needed for wheezing or shortness of breath.)   amiodarone (PACERONE)  200 MG tablet Take 1 tablet (200 mg total) by mouth daily.   Ascorbic Acid (VITAMIN C) 1000 MG tablet Take 1,000 mg by mouth daily.   atorvastatin (LIPITOR) 80 MG tablet Take 1 tablet (80 mg total) by mouth daily.   clopidogrel (PLAVIX) 75 MG tablet Take 1 tablet (75 mg total) by mouth daily with breakfast.   diclofenac Sodium (VOLTAREN) 1 % GEL Apply 4 g topically 4 (four) times daily. (Patient taking differently: Apply 4 g topically 4 (four) times daily. As needed)   empagliflozin (JARDIANCE) 10 MG TABS tablet Take 1 tablet (10 mg total) by mouth daily.   furosemide (LASIX) 20 MG tablet Take 1 tablet (20 mg total) by mouth daily as needed. For swelling in the legs, shortness of breath or if your weight goes up by 2 lbs overnight. Call cardiology for input.   loratadine (CLARITIN) 10 MG tablet Take 10 mg by mouth daily as needed for allergies.   melatonin 5 MG TABS Take 1 tablet (5 mg total) by mouth at bedtime as needed (sleep).   methocarbamol (ROBAXIN) 500 MG tablet TAKE 1 TABLET BY MOUTH EVERY 6 HOURS AS NEEDED FOR MUSCLE SPASMS   Multiple Vitamins-Minerals (MULTIVITAMIN,TX-MINERALS) tablet Take 1 tablet by mouth daily.   nitroGLYCERIN (NITROSTAT) 0.4 MG SL tablet Place 1 tablet (0.4 mg total) under the tongue every 5 (five) minutes x 3 doses as needed for chest pain.   valsartan (DIOVAN) 40 MG tablet Take 1 tablet (40 mg total) by mouth daily.   warfarin (COUMADIN) 4 MG tablet TAKE 1 TABLET BY MOUTH DAILY EXCEPT TAKE 1/2 TABLET  ON MONDAYS AND THURSDAYS OR AS DIRECTED BY ANTICOAGULATION CLINIC   No facility-administered encounter medications on file as of 04/29/2022.    Patient Active Problem List   Diagnosis Date Noted   A-fib ([place]) 04/01/2022   Bradycardia 03/15/2022   Debility 03/08/2022   Acute kidney injury superimposed on chronic kidney disease ([place]) 03/01/2022   Acute on chronic systolic congestive heart failure ([place]) 02/27/2022   Paroxysmal atrial fibrillation ([place]) 02/20/2022    S/P angioplasty with stent 02/17/22 DES to proximal VG to PDA 02/20/2022   NSTEMI (non-ST elevated myocardial infarction) ([place]) 56/97/9480   Chronic systolic CHF (congestive heart failure) ([place]) 04/16/2018   Long term (current) use of anticoagulants 08/25/2017   Actinic keratoses 03/21/2017   Internal hemorrhoid, bleeding 07/01/2015   Rectal bleeding 06/15/2015   Internal hemorrhoids 06/15/2015   Other constipation 06/15/2015   Chronic anticoagulation-Couamdin 03/12/2015   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   Iron deficiency anemia 09/03/2014   Encounter for therapeutic drug monitoring 12/31/2013   Yellow jacket sting 08/06/2013   Low back pain on right side with sciatica 04/24/2013   Severe aortic stenosis 08/10/2012   Coronary artery disease involving coronary bypass graft of native heart without angina pectoris 08/10/2012   Left ventricular dysfunction 08/10/2012   Routine health maintenance 07/12/2012   BEE STING REACTION, LOCAL 09/21/2010   BEN LOC HYPERPLASIA PROS W/UR OBST & OTH LUTS 08/02/2010   Acute and chronic cholecystitis 02/01/2010   Abdominal pain, generalized 01/29/2010   CORONARY ATHEROSCLEROSIS NATIVE CORONARY ARTERY 10/13/2009   HYPERTHYROIDISM 05/22/2009   INSOMNIA 05/22/2009   Mixed hyperlipidemia 04/22/2009   Cardiomyopathy, ischemic-EF 35% 04/22/2009   CAD, AUTOLOGOUS BYPASS GRAFT 10/16/2008   Essential hypertension 09/07/2007   Aortic valve disorder 09/07/2007   GERD without esophagitis 09/07/2007   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    Conditions to be addressed/monitored: Atrial Fibrillation, CHF, HTN, and Insomnia ; Grief  Care Plan : LCSW Plan of Care  Updates made by Rebekah Chesterfield, LCSW since 04/29/2022 12:00 AM     Problem: Coping Skills (General Plan of Care)      Long-Range Goal: Coping Skills Enhanced   Start Date: 11/30/2021  Expected End Date: 07/04/2022  This Visit's Progress: On track  Recent Progress: On  track  Priority: High  Note:   Current barriers:   Acute Mental Health needs related to Grief Needs Support, Education, and Care Coordination in order to meet unmet mental health needs. Clinical Goal(s): patient will work with SW to address concerns related to Grief management   Clinical Interventions:  Assessed patient's previous and current treatment, coping skills, support system and barriers to care  Pt endorses an improvement in sleep reporting no nightmares. He sleeps for 7-8 hrs and feels "well rested" in mornings Pt shared his family continue to provide transportation LCSW reviewed upcoming appts Patient denies any resource needs Pt was successful in identifying healthy coping skills to assist with promoting mood (working, gym, watching tv, and reading scripture) Mindfulness or Relaxation training provided Active listening / Reflection utilized  Emotional Support Provided Provided psychoeducation for mental health needs  Quality of sleep assessed & Sleep Hygiene techniques promoted  Participation in counseling encouraged  Participation in support group encouraged  Verbalization of feelings encouraged  ; Inter-disciplinary care team collaboration (see longitudinal plan of care) Patient Goals/Self-Care Activities: Over the next 120 days Attend scheduled appointments with providers Utilize healthy coping skills and/or resources discussed Contact PCP office with any questions or  concerns       Follow Up Plan:  No follow up required. Pt completed all care plan goals      Christa See, MSW, [facility] [email or web address] Phone [phone] 12:33 PM

## 2022-04-29 NOTE — Patient Instructions (Signed)
Visit Information  Thank you for taking time to visit with me today. Please don't hesitate to contact me if I can be of assistance to you before our next scheduled telephone appointment.  Following are the goals we discussed today:  Patient Goals/Self-Care Activities: Over the next 120 days Attend scheduled appointments with providers Utilize healthy coping skills and/or resources discussed Contact PCP office with any questions or concerns   If you are experiencing a Mental Health or [place] or need someone to talk to, please call 911   The patient verbalized understanding of instructions, educational materials, and care plan provided today and DECLINED offer to receive copy of patient instructions, educational materials, and care plan.   No further follow up required: Pt completed all care plan goals  Christa See, MSW, [facility] [email or web address] Phone [phone] 12:35 PM

## 2022-05-04 DIAGNOSIS — I10 Essential (primary) hypertension: Secondary | ICD-10-CM

## 2022-05-04 DIAGNOSIS — I4891 Unspecified atrial fibrillation: Secondary | ICD-10-CM

## 2022-05-04 DIAGNOSIS — Z955 Presence of coronary angioplasty implant and graft: Secondary | ICD-10-CM | POA: Diagnosis not present

## 2022-05-04 DIAGNOSIS — I482 Chronic atrial fibrillation, unspecified: Secondary | ICD-10-CM

## 2022-05-04 DIAGNOSIS — I110 Hypertensive heart disease with heart failure: Secondary | ICD-10-CM | POA: Diagnosis not present

## 2022-05-04 DIAGNOSIS — I509 Heart failure, unspecified: Secondary | ICD-10-CM | POA: Diagnosis not present

## 2022-05-04 DIAGNOSIS — M1990 Unspecified osteoarthritis, unspecified site: Secondary | ICD-10-CM | POA: Diagnosis not present

## 2022-05-06 DIAGNOSIS — I252 Old myocardial infarction: Secondary | ICD-10-CM | POA: Diagnosis not present

## 2022-05-06 DIAGNOSIS — Z955 Presence of coronary angioplasty implant and graft: Secondary | ICD-10-CM | POA: Diagnosis not present

## 2022-05-09 ENCOUNTER — Encounter: Payer: Self-pay | Admitting: Cardiovascular Disease

## 2022-05-09 ENCOUNTER — Ambulatory Visit: Payer: Medicare Other | Admitting: Cardiovascular Disease

## 2022-05-09 VITALS — BP 120/60 | HR 55 | Ht 61.0 in | Wt 121.0 lb

## 2022-05-09 DIAGNOSIS — I359 Nonrheumatic aortic valve disorder, unspecified: Secondary | ICD-10-CM

## 2022-05-09 DIAGNOSIS — I10 Essential (primary) hypertension: Secondary | ICD-10-CM | POA: Diagnosis not present

## 2022-05-09 DIAGNOSIS — Z955 Presence of coronary angioplasty implant and graft: Secondary | ICD-10-CM | POA: Diagnosis not present

## 2022-05-09 DIAGNOSIS — N1832 Chronic kidney disease, stage 3b: Secondary | ICD-10-CM

## 2022-05-09 DIAGNOSIS — I252 Old myocardial infarction: Secondary | ICD-10-CM | POA: Diagnosis not present

## 2022-05-09 DIAGNOSIS — E782 Mixed hyperlipidemia: Secondary | ICD-10-CM | POA: Diagnosis not present

## 2022-05-09 DIAGNOSIS — I4819 Other persistent atrial fibrillation: Secondary | ICD-10-CM | POA: Diagnosis not present

## 2022-05-09 DIAGNOSIS — I2510 Atherosclerotic heart disease of native coronary artery without angina pectoris: Secondary | ICD-10-CM

## 2022-05-09 DIAGNOSIS — I5022 Chronic systolic (congestive) heart failure: Secondary | ICD-10-CM

## 2022-05-09 NOTE — Patient Instructions (Signed)
Medication Instructions:  Your physician recommends that you continue on your current medications as directed. Please refer to the Current Medication list given to you today.  *If you need a refill on your cardiac medications before your next appointment, please call your pharmacy*   Lab Work: NONE If you have labs (blood work) drawn today and your tests are completely normal, you will receive your results only by: [place] (if you have MyChart) OR A paper copy in the mail If you have any lab test that is abnormal or we need to change your treatment, we will call you to review the results.   Testing/Procedures: NONE   Follow-Up: At [facility], you and your health needs are our priority.  As part of our continuing mission to provide you with exceptional heart care, we have created designated Provider Care Teams.  These Care Teams include your primary Cardiologist (physician) and Advanced Practice Providers (APPs -  Physician Assistants and Nurse Practitioners) who all work together to provide you with the care you need, when you need it.  We recommend signing up for the patient portal called "MyChart".  Sign up information is provided on this After Visit Summary.  MyChart is used to connect with patients for Virtual Visits (Telemedicine).  Patients are able to view lab/test results, encounter notes, upcoming appointments, etc.  Non-urgent messages can be sent to your provider as well.   To learn more about what you can do with MyChart, go to [email or web address].    Your next appointment:   4 month(s)  The format for your next appointment:   In Person  Provider:   Tanja Port, NP or [name], PA     Important Information About Sugar

## 2022-05-09 NOTE — Progress Notes (Unsigned)
Cardiology Office Note:    Date:  05/09/2022   ID:  Jesus [name], DOB February 01, 1941, MRN [id number]  PCP:  [name], MD   [facility] Providers Cardiologist:  [name], MD Electrophysiologist:  [name], MD     Referring MD: [name], MD   Chief Complaint  Patient presents with   Coronary Artery Disease    History of Present Illness:    Jesus [name] is a 81 y.o. male with a hx of coronary artery disease status post remote CABG, persistent atrial fibrillation anticoagulated with warfarin, severe aortic stenosis status post TAVR, chronic systolic heart failure, hypertension, and mixed hyperlipidemia.  The patient has a history of remote anterior MI in 1980.  He underwent multivessel CABG in 1997.  He has had an LVEF around 40 to 45% with inferior wall scar.  The patient had a non-ST elevation infarct in March 2023 and was found to have severe stenosis in the saphenous vein graft to PDA, treated with a drug-eluting stent.  He experienced nonsustained VT and was treated with IV amiodarone and beta-blockade.  He was readmitted with acute on chronic systolic heart failure and medications were adjusted.  The patient is here with his sister-in-law today.  He has been feeling better.  He denies symptoms of chest pain, chest pressure, or shortness of breath.  His leg swelling is still present but greatly improved.  No orthopnea, PND, lightheadedness, or syncope.  Past Medical History:  Diagnosis Date   Acute myocardial infarction of inferior wall ([place]) 1980   Aortic stenosis    mild with a mean aortic valve gradient of 12 mmHg   Atrial fibrillation (HCC)    holding sinus rhythm on Amiodarone   CAD (coronary artery disease)    a. S/P Ant MI 1980;  b. 1997 S/P CABG x 8 (LIMA to diag-LAD, SVG to OM1-OM2-OM3, SVG to [facility] - Dr [name]);  c. 01/2015 Cath: 3VD, 8/8 patent grafts.   Cardiomyopathy    Dysrhythmia    Esophageal reflux    GERD  (gastroesophageal reflux disease)    Headache    History of colonoscopy    History of transesophageal echocardiography (TEE) for monitoring    Other and unspecified hyperlipidemia    Paroxysmal atrial fibrillation ([place]) 02/20/2022   S/P angioplasty with stent 02/17/22 DES to proximal VG to PDA 02/20/2022   S/P TAVR (transcatheter aortic valve replacement)    a. 03/2015 26 mm Edwards Sapien 3 transcatheter heart valve placed via open left transfemoral approach.   Severe aortic stenosis 08/10/2012   Skin lesions, generalized    facial which [name] represent actinic keratoses and possible photosensitivity from Amiodarone   Unspecified essential hypertension     Past Surgical History:  Procedure Laterality Date   CARDIAC CATHETERIZATION     CARDIOVERSION  11/18/2006   Dr. [name]   CATARACT EXTRACTION W/ INTRAOCULAR LENS IMPLANT  April '13  (Dr. [name])   left eye only   CHOLECYSTECTOMY     CORONARY ARTERY BYPASS GRAFT  02/09/1996   LIMA to diag-LAD, SVG to OM1-OM2-OM3, SVG to [facility]   CORONARY STENT INTERVENTION N/A 02/17/2022   Procedure: CORONARY STENT INTERVENTION;  Surgeon: Burnell Blanks, MD;  Location: [place] [facility];  Service: Cardiovascular;  Laterality: N/A;   CORONARY/GRAFT ANGIOGRAPHY N/A 02/17/2022   Procedure: CORONARY/GRAFT ANGIOGRAPHY;  Surgeon: Burnell Blanks, MD;  Location: [place] [facility];  Service: Cardiovascular;  Laterality: N/A;   EYE SURGERY  LEFT AND RIGHT HEART CATHETERIZATION WITH CORONARY/GRAFT ANGIOGRAM N/A 01/26/2015   Procedure: LEFT AND RIGHT HEART CATHETERIZATION WITH [name];  Surgeon: [name], MD;  Location: [facility] CATH LAB;  Service: Cardiovascular;  Laterality: N/A;   TEE WITHOUT CARDIOVERSION N/A 03/10/2015   Procedure: TRANSESOPHAGEAL ECHOCARDIOGRAM (TEE);  Surgeon: [name], MD;  Location: [place];  Service: Open Heart Surgery;  Laterality: N/A;   TONSILLECTOMY     TRANSCATHETER AORTIC VALVE  REPLACEMENT, TRANSFEMORAL N/A 03/10/2015   Procedure: TRANSCATHETER AORTIC VALVE REPLACEMENT, TRANSFEMORAL;  Surgeon: [name], MD;  Location: [place];  Service: Open Heart Surgery;  Laterality: N/A;    Current Medications: Current Meds  Medication Sig   acetaminophen (TYLENOL) 325 MG tablet Take 1-2 tablets (325-650 mg total) by mouth every 4 (four) hours as needed for mild pain.   amiodarone (PACERONE) 200 MG tablet Take 1 tablet (200 mg total) by mouth daily.   Ascorbic Acid (VITAMIN C) 1000 MG tablet Take 1,000 mg by mouth daily.   atorvastatin (LIPITOR) 80 MG tablet Take 1 tablet (80 mg total) by mouth daily.   clopidogrel (PLAVIX) 75 MG tablet Take 1 tablet (75 mg total) by mouth daily with breakfast.   diclofenac Sodium (VOLTAREN) 1 % GEL Apply 4 g topically 4 (four) times daily. (Patient taking differently: Apply 4 g topically 4 (four) times daily. As needed)   empagliflozin (JARDIANCE) 10 MG TABS tablet Take 1 tablet (10 mg total) by mouth daily.   furosemide (LASIX) 20 MG tablet Take 1 tablet (20 mg total) by mouth daily as needed. For swelling in the legs, shortness of breath or if your weight goes up by 2 lbs overnight. Call cardiology for input.   loratadine (CLARITIN) 10 MG tablet Take 10 mg by mouth daily as needed for allergies.   melatonin 5 MG TABS Take 1 tablet (5 mg total) by mouth at bedtime as needed (sleep).   methocarbamol (ROBAXIN) 500 MG tablet TAKE 1 TABLET BY MOUTH EVERY 6 HOURS AS NEEDED FOR MUSCLE SPASMS   Multiple Vitamins-Minerals (MULTIVITAMIN,TX-MINERALS) tablet Take 1 tablet by mouth daily.   nitroGLYCERIN (NITROSTAT) 0.4 MG SL tablet Place 1 tablet (0.4 mg total) under the tongue every 5 (five) minutes x 3 doses as needed for chest pain.   valsartan (DIOVAN) 40 MG tablet Take 1 tablet (40 mg total) by mouth daily.   warfarin (COUMADIN) 4 MG tablet TAKE 1 TABLET BY MOUTH DAILY EXCEPT TAKE 1/2 TABLET ON MONDAYS AND THURSDAYS OR AS DIRECTED BY ANTICOAGULATION  CLINIC     Allergies:   Amlodipine, Codeine, and Iodine   Social History   Socioeconomic History   Marital status: Married    Spouse name: Not on file   Number of children: Not on file   Years of education: Not on file   Highest education level: Not on file  Occupational History   Not on file  Tobacco Use   Smoking status: Never   Smokeless tobacco: Never   Tobacco comments:    Does not smoke.  Vaping Use   Vaping Use: Never used  Substance and Sexual Activity   Alcohol use: No    Alcohol/week: 0.0 standard drinks   Drug use: No   Sexual activity: Not Currently  Other Topics Concern   Not on file  Social History Narrative   HSG. [employer] 6 years. Married - '69 - 1 year/divorced. '76 - . No children. Work - mfg/textiles - Designer, multimedia; currently works doing  maintenance. [employer] - they have discussed this. Provided packet august '13.                Social Determinants of Health   Financial Resource Strain: Low Risk    Difficulty of Paying Living Expenses: Not hard at all  Food Insecurity: No Food Insecurity   Worried About Charity fundraiser in the Last Year: Never true   [place] in the Last Year: Never true  Transportation Needs: No Transportation Needs   Lack of Transportation (Medical): No   Lack of Transportation (Non-Medical): No  Physical Activity: Sufficiently Active   Days of Exercise per Week: 5 days   Minutes of Exercise per Session: 60 min  Stress: No Stress Concern Present   Feeling of Stress : Not at all  Social Connections: Moderately Integrated   Frequency of Communication with Friends and Family: Three times a week   Frequency of Social Gatherings with Friends and Family: Three times a week   Attends Religious Services: More than 4 times per year   Active Member of Clubs or Organizations: Yes   Attends Archivist Meetings: More than 4 times per year   Marital Status: Widowed     Family History: The patient's  family history includes Atrial fibrillation in his brother; Crohn's disease in his mother; Heart attack (age of onset: 12) in his father; Heart disease in his father and paternal uncle; Heart failure in his father; Prostate cancer in his paternal uncle; Stroke (age of onset: 89) in his mother. There is no history of Colon cancer.  ROS:   Please see the history of present illness.    All other systems reviewed and are negative.  EKGs/Labs/Other Studies Reviewed:    The following studies were reviewed today: CXR 10-[name]-2023: IMPRESSION: No active cardiopulmonary disease.  Echo 02/16/2022: 1. Left ventricular ejection fraction, by estimation, is 35 to 40%. The  left ventricle has moderately decreased function. The left ventricle  demonstrates regional wall motion abnormalities (see scoring  diagram/findings for description).  Inferior/inferolateral akinesis. Left ventricular diastolic parameters are  consistent with Grade II diastolic dysfunction (pseudonormalization).  Elevated left atrial pressure.   2. Right ventricular systolic function is normal. The right ventricular  size is mildly enlarged.   3. Left atrial size was severely dilated.   4. The mitral valve is normal in structure. Trivial mitral valve  regurgitation. No evidence of mitral stenosis.   5. The aortic valve has been repaired/replaced. Aortic valve  regurgitation is mild. There is a 26 mm Edwards Sapien prosthetic (TAVR)  valve present in the aortic position. Procedure Date: 03/10/15. Echo  findings are consistent with perivalvular leak of  the aortic prosthesis. Vmax 2.9 m/s, MG 45mHg (unchanged from prior  echo), EOA 1.4 cm^2, DI 0.46   6. The inferior vena cava is normal in size with greater than 50%  respiratory variability, suggesting right atrial pressure of 3 mmHg.   Cardiac Cath 02/17/22: Conclusion      Ost RCA to Prox RCA lesion is 100% stenosed.   Prox Graft lesion before RV Branch  is 80% stenosed.   RPAV  lesion is 100% stenosed.   Ost LM to Mid LM lesion is 100% stenosed.   3rd Mrg lesion is 80% stenosed.   A drug-eluting stent was successfully placed using a STENT ONYX FRONTIER 3.5X12.   Post intervention, there is a 10% residual stenosis.   SVG graft was visualized by angiography.   SVG  graft was visualized by angiography.   LIMA graft was visualized by angiography.   Severe native vessel CAD s/p 6V CABG with 6/6 patent grafts.  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 There is a severe stenosis in the proximal body of the SVG to the PDA.  Successful PTCA/DES x 1 proximal body of SVG to PDA using a distal protection device.   Recommendations: Continue ASA/Plavix until he is therapeutic on coumadin and can then consider stopping ASA while continuing Plavix with coumadin.   EKG:  EKG is not ordered today.    Recent Labs: 02/28/2022: Magnesium 1.9 03/09/2022: ALT 31 03/21/2022: B Natriuretic Peptide 372.2 03/29/2022: Hemoglobin 12.5; Platelets 146 04/04/2022: TSH 2.650 04/11/2022: BUN 21; Creatinine, Ser 1.43; Potassium 4.8; Sodium 141  Recent Lipid Panel    Component Value Date/Time   CHOL 155 02/16/2022 0446   CHOL 141 06/02/2021 0732   TRIG 68 02/16/2022 0446   TRIG 67 11/20/2006 0748   HDL 49 02/16/2022 0446   HDL 51 06/02/2021 0732   CHOLHDL 3.2 02/16/2022 0446   VLDL 14 02/16/2022 0446   LDLCALC 92 02/16/2022 0446   LDLCALC 71 06/02/2021 0732     Risk Assessment/Calculations:    CHA2DS2-VASc Score = 5  {Confirm score is correct.  If not, click here to update score.  REFRESH note.  :1} This indicates a 7.2% annual risk of stroke. The patient's score is based upon: CHF History: 1 HTN History: 1 Diabetes History: 0 Stroke  History: 0 Vascular Disease History: 1 Age Score: 2 Gender Score: 0   {This patient has a significant risk of stroke if diagnosed with atrial fibrillation.  Please consider VKA or DOAC agent for anticoagulation if the bleeding risk is acceptable.   You can also use the SmartPhrase .[email or web address] for documentation.   :[id number]}       Physical Exam:    VS:  BP 120/60   Pulse (!) 55   Ht '5\' 1"'$  (1.549 m)   Wt 121 lb (54.9 kg)   SpO2 95%   BMI 22.86 kg/m     Wt Readings from Last 3 Encounters:  05/09/22 121 lb (54.9 kg)  04/05/22 121 lb 6.4 oz (55.1 kg)  04/04/22 121 lb 6.4 oz (55.1 kg)     GEN:  Well nourished, well developed pleasant elderly male in no acute distress HEENT: Normal NECK: No JVD; No carotid bruits LYMPHATICS: No lymphadenopathy CARDIAC: Irregularly irregular with a grade 2/6 systolic murmur at the right upper sternal border and 2/6 diastolic decrescendo murmur at the right upper sternal border RESPIRATORY:  Clear to auscultation without rales, wheezing or rhonchi  ABDOMEN: Soft, non-tender, non-distended MUSCULOSKELETAL: Trace bilateral pretibial edema; No deformity  SKIN: Warm and dry NEUROLOGIC:  Alert and oriented x 3 PSYCHIATRIC:  Normal affect   ASSESSMENT:    1. Persistent atrial fibrillation ([place])   2. Chronic systolic heart failure ([place])   3. Coronary artery disease involving native coronary artery of native heart without angina pectoris   4. Mixed hyperlipidemia   5. Essential hypertension   6. Aortic valve disorder   7. Stage 3b chronic kidney disease ([place])    PLAN:    In order of problems  listed above:  Continue amiodarone for rhythm control and warfarin for anticoagulation.  For amiodarone monitoring, his recent TSH, LFTs, and chest x-ray are all reviewed, all studies within normal limits. Clinically improved on a combination of Jardiance, valsartan.  No beta-blocker at present because of bradycardia.  Using furosemide as needed. Recent  PCI as outlined above.  Treated with clopidogrel and atorvastatin. Blood pressure is well controlled on valsartan The patient has normal transvalvular gradients and mild to moderate paravalvular regurgitation, stable over time continue clinical follow-up and annual echo surveillance Continue valsartan.  Avoid NSAIDs and nephrotoxins.    Appears stable today after recent hospitalizations for ACS and acute on chronic CHF. Continue current Rx and arrange APP follow-up in 4 months.        Medication Adjustments/Labs and Tests Ordered: Current medicines are reviewed at length with the patient today.  Concerns regarding medicines are outlined above.  No orders of the defined types were placed in this encounter.  No orders of the defined types were placed in this encounter.   Patient Instructions  Medication Instructions:  Your physician recommends that you continue on your current medications as directed. Please refer to the Current Medication list given to you today.  *If you need a refill on your cardiac medications before your next appointment, please call your pharmacy*   Lab Work: NONE If you have labs (blood work) drawn today and your tests are completely normal, you will receive your results only by: [place] (if you have MyChart) OR A paper copy in the mail If you have any lab test that is abnormal or we need to change your treatment, we will call you to review the results.   Testing/Procedures: NONE   Follow-Up: At [facility], you and your health needs are our priority.  As part of our continuing mission to provide you with exceptional heart care, we have created designated Provider Care Teams.  These Care Teams include your primary Cardiologist (physician) and Advanced Practice Providers (APPs -  Physician Assistants and Nurse Practitioners) who all work together to provide you with the care you need, when you need it.  We recommend signing up for the patient  portal called "MyChart".  Sign up information is provided on this After Visit Summary.  MyChart is used to connect with patients for Virtual Visits (Telemedicine).  Patients are able to view lab/test results, encounter notes, upcoming appointments, etc.  Non-urgent messages can be sent to your provider as well.   To learn more about what you can do with MyChart, go to [email or web address].    Your next appointment:   4 month(s)  The format for your next appointment:   In Person  Provider:   Tanja Port, NP or [name], [place]     Important Information About Sugar         Signed, [name], MD  05/09/2022 1:56 PM    [facility]

## 2022-05-10 ENCOUNTER — Ambulatory Visit: Payer: Medicare Other

## 2022-05-10 DIAGNOSIS — Z7901 Long term (current) use of anticoagulants: Secondary | ICD-10-CM

## 2022-05-10 LAB — POCT INR: INR: 2.5 (ref 2.0–3.0)

## 2022-05-10 NOTE — Progress Notes (Signed)
Continue 1 tablet daily except take 1/2 tablet on Mondays and Thursdays. Recheck in 2 week at [facility], at [address], [place]. Contact the coumadin clinic at [phone] if any concerns.

## 2022-05-10 NOTE — Patient Instructions (Addendum)
Pre visit review using our clinic review tool, if applicable. No additional management support is needed unless otherwise documented below in the visit note.  Continue 1 tablet daily except take 1/2 tablet on Mondays and Thursdays. Recheck in 2 week at [facility], at [address], [place]. Contact the coumadin clinic at [phone] if any concerns.

## 2022-05-11 DIAGNOSIS — I252 Old myocardial infarction: Secondary | ICD-10-CM | POA: Diagnosis not present

## 2022-05-11 DIAGNOSIS — Z955 Presence of coronary angioplasty implant and graft: Secondary | ICD-10-CM | POA: Diagnosis not present

## 2022-05-13 DIAGNOSIS — I252 Old myocardial infarction: Secondary | ICD-10-CM | POA: Diagnosis not present

## 2022-05-13 DIAGNOSIS — Z955 Presence of coronary angioplasty implant and graft: Secondary | ICD-10-CM | POA: Diagnosis not present

## 2022-05-16 DIAGNOSIS — Z955 Presence of coronary angioplasty implant and graft: Secondary | ICD-10-CM | POA: Diagnosis not present

## 2022-05-16 DIAGNOSIS — I252 Old myocardial infarction: Secondary | ICD-10-CM | POA: Diagnosis not present

## 2022-05-18 ENCOUNTER — Telehealth: Payer: Self-pay | Admitting: Family Medicine

## 2022-05-18 DIAGNOSIS — Z955 Presence of coronary angioplasty implant and graft: Secondary | ICD-10-CM | POA: Diagnosis not present

## 2022-05-18 DIAGNOSIS — I252 Old myocardial infarction: Secondary | ICD-10-CM | POA: Diagnosis not present

## 2022-05-18 MED ORDER — AMOXICILLIN 500 MG PO CAPS: ORAL_CAPSULE | ORAL | 0 refills | Status: DC

## 2022-05-18 NOTE — Telephone Encounter (Signed)
Rx sent 

## 2022-05-18 NOTE — Telephone Encounter (Signed)
Pt having a dental procedure tomorrow, tooth hurts, directed by his dentist to reach out to PCP for prescription of amoxacillin.

## 2022-05-20 ENCOUNTER — Encounter: Payer: Self-pay | Admitting: Family

## 2022-05-20 ENCOUNTER — Ambulatory Visit (INDEPENDENT_AMBULATORY_CARE_PROVIDER_SITE_OTHER): Payer: Medicare Other | Admitting: Family

## 2022-05-20 VITALS — BP 142/60 | HR 68 | Temp 98.3°F | Ht 61.0 in | Wt 118.6 lb

## 2022-05-20 DIAGNOSIS — R059 Cough, unspecified: Secondary | ICD-10-CM

## 2022-05-20 DIAGNOSIS — I252 Old myocardial infarction: Secondary | ICD-10-CM | POA: Diagnosis not present

## 2022-05-20 DIAGNOSIS — Z955 Presence of coronary angioplasty implant and graft: Secondary | ICD-10-CM | POA: Diagnosis not present

## 2022-05-20 DIAGNOSIS — R051 Acute cough: Secondary | ICD-10-CM | POA: Diagnosis not present

## 2022-05-20 LAB — POC COVID19 BINAXNOW: SARS Coronavirus 2 Ag: NEGATIVE

## 2022-05-20 LAB — POCT INFLUENZA A/B
Influenza A, POC: NEGATIVE
Influenza B, POC: NEGATIVE

## 2022-05-20 MED ORDER — BENZONATATE 100 MG PO CAPS: 100.0000 mg | ORAL_CAPSULE | Freq: Three times a day (TID) | ORAL | 0 refills | Status: DC | PRN

## 2022-05-20 NOTE — Progress Notes (Signed)
   Acute Office Visit  Subjective:     Patient ID: Jesus [name], male    DOB: [birth year], 81 y.o.   MRN: [id number]  Chief Complaint  Patient presents with   Cough    Productive with white sputum x2 days, tried Robitussin with some relief    HPI Patient is in today with c/o cough x 2 days. Cough is productive with clear phlegm. He denies any congestion, fever, chills or body aches. Has been taking Robitussin that has not helped much. Did not sleep well last night due to cough.   Review of Systems  Constitutional: Negative.   Respiratory:  Positive for cough. Negative for shortness of breath and wheezing.   Cardiovascular:  Negative for chest pain.  All other systems reviewed and are negative.       Objective:    BP (!) 142/60 (BP Location: Left Arm, Patient Position: Sitting, Cuff Size: Normal)   Pulse 68   Temp 98.3 F (36.8 C) (Oral)   Ht '5\' 1"'$  (1.549 m)   Wt 118 lb 9.6 oz (53.8 kg)   SpO2 96%   BMI 22.41 kg/m    Physical Exam Vitals and nursing note reviewed.  Constitutional:      Appearance: Normal appearance.  HENT:     Right Ear: Tympanic membrane, ear canal and external ear normal.     Left Ear: Tympanic membrane, ear canal and external ear normal.     Nose: Nose normal.     Mouth/Throat:     Mouth: Mucous membranes are moist.  Cardiovascular:     Rate and Rhythm: Normal rate and regular rhythm.     Pulses: Normal pulses.     Heart sounds: Normal heart sounds.  Pulmonary:     Effort: Pulmonary effort is normal.     Breath sounds: Normal breath sounds.  Musculoskeletal:        General: Normal range of motion.     Cervical back: Normal range of motion.  Skin:    General: Skin is warm and dry.  Neurological:     General: No focal deficit present.     Mental Status: He is alert and oriented to person, place, and time.  Psychiatric:        Mood and Affect: Mood normal.        Behavior: Behavior normal.     Results for orders placed or performed  in visit on 05/20/22  POC COVID-19  Result Value Ref Range   SARS Coronavirus 2 Ag Negative Negative  POC Influenza A/B  Result Value Ref Range   Influenza A, POC Negative Negative   Influenza B, POC Negative Negative        Assessment & Plan:   Problem List Items Addressed This Visit   None Visit Diagnoses     Cough, unspecified type    -  Primary   Relevant Orders   POC COVID-19 (Completed)   POC Influenza A/B (Completed)   Acute cough           Meds ordered this encounter  Medications   benzonatate (TESSALON) 100 MG capsule    Sig: Take 1 capsule (100 mg total) by mouth 3 (three) times daily as needed for cough.    Dispense:  20 capsule    Refill:  0   Call the office if symptoms worsen or persist. Recheck as scheduled and sooner as needed No follow-ups on file.  Kennyth Arnold, FNP

## 2022-05-23 DIAGNOSIS — Z955 Presence of coronary angioplasty implant and graft: Secondary | ICD-10-CM | POA: Diagnosis not present

## 2022-05-23 DIAGNOSIS — I252 Old myocardial infarction: Secondary | ICD-10-CM | POA: Diagnosis not present

## 2022-05-25 DIAGNOSIS — I252 Old myocardial infarction: Secondary | ICD-10-CM | POA: Diagnosis not present

## 2022-05-25 DIAGNOSIS — Z955 Presence of coronary angioplasty implant and graft: Secondary | ICD-10-CM | POA: Diagnosis not present

## 2022-05-27 DIAGNOSIS — I252 Old myocardial infarction: Secondary | ICD-10-CM | POA: Diagnosis not present

## 2022-05-27 DIAGNOSIS — Z955 Presence of coronary angioplasty implant and graft: Secondary | ICD-10-CM | POA: Diagnosis not present

## 2022-05-30 DIAGNOSIS — I252 Old myocardial infarction: Secondary | ICD-10-CM | POA: Diagnosis not present

## 2022-05-30 DIAGNOSIS — Z955 Presence of coronary angioplasty implant and graft: Secondary | ICD-10-CM | POA: Diagnosis not present

## 2022-05-31 ENCOUNTER — Ambulatory Visit: Payer: Medicare Other

## 2022-05-31 LAB — POCT INR: INR: 2.6 (ref 2.0–3.0)

## 2022-05-31 NOTE — Progress Notes (Signed)
Continue 1 tablet daily except take 1/2 tablet on Mondays and Thursdays. Recheck in 4 week at [place].

## 2022-06-01 ENCOUNTER — Telehealth: Payer: Self-pay | Admitting: Cardiovascular Disease

## 2022-06-01 DIAGNOSIS — Z955 Presence of coronary angioplasty implant and graft: Secondary | ICD-10-CM | POA: Diagnosis not present

## 2022-06-01 DIAGNOSIS — I252 Old myocardial infarction: Secondary | ICD-10-CM | POA: Diagnosis not present

## 2022-06-01 NOTE — Telephone Encounter (Signed)
   Pre-operative Risk Assessment    Patient Name: Jesus [name]  DOB: [birth year] MRN: [id number]     Request for Surgical Clearance    Procedure:   Root Canal  Date of Surgery:  Clearance 06/02/22                                 Surgeon:  Dr. Remonia Richter Surgeon's Group or Practice Name:  Dr. Murrell Redden Phone number:  [phone] Fax number:  [phone]   Type of Clearance Requested:   - Medical    Type of Anesthesia:  Local    Additional requests/questions:  Please advise surgeon/provider what medications should be held.  Signed, Belisicia T Kenton Kingfisher   06/01/2022, 4:46 PM

## 2022-06-02 NOTE — Telephone Encounter (Signed)
   Name: Jesus [name]  DOB: [birth year]  MRN: [id number]   Primary Cardiologist: [name], MD  Chart reviewed as part of pre-operative protocol coverage. Patient was contacted 06/02/2022 in reference to pre-operative risk assessment for pending surgery as outlined below.  Jesus [name] was last seen on 05/09/2022 by Dr. [name].  Since that day, Jesus [name] has done really well with no new cardiovascular issues.  We reviewed his activity level.  He is still working part-time as a Conservation officer, nature for [name].  He works half days.  He is able to walk indoors and walks outdoors about half a mile each day.  He has no trouble with 1-2 flights of stairs.  He also does all his own grocery shopping and carries his groceries in his house.  From this information, he exceeds the 4 METS requirement for clearance.    RCRI: 6.0 % 30-day risk of death, MI, or cardiac arrest  He is cleared to proceed with the following procedure: root canal with the excepted risk above.  Pharmacologic clearance was not requested therefore he should not have to hold any of his medications for this procedure.  Therefore, based on ACC/AHA guidelines, the patient would be at acceptable risk for the planned procedure without further cardiovascular testing.   The patient was advised that if he develops new symptoms prior to surgery to contact our office to arrange for a follow-up visit, and he verbalized understanding.  I will route this recommendation to the requesting party via Epic fax function and remove from pre-op pool. Please call with questions.  Elgie Collard, PA-C 06/02/2022, 9:03 AM

## 2022-06-03 DIAGNOSIS — Z955 Presence of coronary angioplasty implant and graft: Secondary | ICD-10-CM | POA: Diagnosis not present

## 2022-06-03 DIAGNOSIS — I252 Old myocardial infarction: Secondary | ICD-10-CM | POA: Diagnosis not present

## 2022-06-06 DIAGNOSIS — I252 Old myocardial infarction: Secondary | ICD-10-CM | POA: Diagnosis not present

## 2022-06-06 DIAGNOSIS — Z955 Presence of coronary angioplasty implant and graft: Secondary | ICD-10-CM | POA: Diagnosis not present

## 2022-06-08 DIAGNOSIS — Z955 Presence of coronary angioplasty implant and graft: Secondary | ICD-10-CM | POA: Diagnosis not present

## 2022-06-08 DIAGNOSIS — I252 Old myocardial infarction: Secondary | ICD-10-CM | POA: Diagnosis not present

## 2022-06-10 DIAGNOSIS — Z955 Presence of coronary angioplasty implant and graft: Secondary | ICD-10-CM | POA: Diagnosis not present

## 2022-06-10 DIAGNOSIS — I252 Old myocardial infarction: Secondary | ICD-10-CM | POA: Diagnosis not present

## 2022-06-15 DIAGNOSIS — Z955 Presence of coronary angioplasty implant and graft: Secondary | ICD-10-CM | POA: Diagnosis not present

## 2022-06-15 DIAGNOSIS — I252 Old myocardial infarction: Secondary | ICD-10-CM | POA: Diagnosis not present

## 2022-06-16 ENCOUNTER — Other Ambulatory Visit: Payer: Self-pay

## 2022-06-16 MED ORDER — AMOXICILLIN 500 MG PO CAPS: ORAL_CAPSULE | ORAL | 0 refills | Status: DC

## 2022-06-16 NOTE — Telephone Encounter (Signed)
Rx done. 

## 2022-06-17 ENCOUNTER — Telehealth: Payer: Self-pay | Admitting: Family Medicine

## 2022-06-17 DIAGNOSIS — I252 Old myocardial infarction: Secondary | ICD-10-CM | POA: Diagnosis not present

## 2022-06-17 DIAGNOSIS — Z955 Presence of coronary angioplasty implant and graft: Secondary | ICD-10-CM | POA: Diagnosis not present

## 2022-06-17 NOTE — Telephone Encounter (Signed)
Left message for patient to call back and schedule Medicare Annual Wellness Visit (AWV) either virtually or in office. Left  my Herbie Drape number [phone]   Last AWV ;06/21/21 please schedule at anytime with [facility] Nurse Health Advisor 1 or 2

## 2022-06-20 DIAGNOSIS — I252 Old myocardial infarction: Secondary | ICD-10-CM | POA: Diagnosis not present

## 2022-06-20 DIAGNOSIS — Z955 Presence of coronary angioplasty implant and graft: Secondary | ICD-10-CM | POA: Diagnosis not present

## 2022-06-20 NOTE — Telephone Encounter (Signed)
Returned patients call   Patient returned my call 7/14

## 2022-06-22 DIAGNOSIS — I252 Old myocardial infarction: Secondary | ICD-10-CM | POA: Diagnosis not present

## 2022-06-22 DIAGNOSIS — Z955 Presence of coronary angioplasty implant and graft: Secondary | ICD-10-CM | POA: Diagnosis not present

## 2022-06-24 DIAGNOSIS — Z955 Presence of coronary angioplasty implant and graft: Secondary | ICD-10-CM | POA: Diagnosis not present

## 2022-06-24 DIAGNOSIS — I252 Old myocardial infarction: Secondary | ICD-10-CM | POA: Diagnosis not present

## 2022-06-27 ENCOUNTER — Ambulatory Visit: Payer: Medicare Other | Admitting: Family Medicine

## 2022-06-27 ENCOUNTER — Ambulatory Visit (INDEPENDENT_AMBULATORY_CARE_PROVIDER_SITE_OTHER): Payer: Medicare Other

## 2022-06-27 ENCOUNTER — Encounter: Payer: Self-pay | Admitting: Family Medicine

## 2022-06-27 VITALS — BP 136/58 | HR 53 | Temp 98.1°F | Ht 61.0 in | Wt 120.8 lb

## 2022-06-27 DIAGNOSIS — Z7901 Long term (current) use of anticoagulants: Secondary | ICD-10-CM

## 2022-06-27 DIAGNOSIS — I255 Ischemic cardiomyopathy: Secondary | ICD-10-CM

## 2022-06-27 DIAGNOSIS — E785 Hyperlipidemia, unspecified: Secondary | ICD-10-CM

## 2022-06-27 DIAGNOSIS — I480 Paroxysmal atrial fibrillation: Secondary | ICD-10-CM

## 2022-06-27 LAB — LIPID PANEL
Cholesterol: 152 mg/dL (ref 0–200)
HDL: 49 mg/dL (ref >39.00)
LDL Cholesterol: 84 mg/dL (ref 0–99)
NonHDL: 102.92
Total CHOL/HDL Ratio: 3
Triglycerides: 94 mg/dL (ref 0.0–149.0)
VLDL: 18.8 mg/dL (ref 0.0–40.0)

## 2022-06-27 LAB — POCT INR: INR: 2.6 (ref 2.0–3.0)

## 2022-06-27 NOTE — Patient Instructions (Addendum)
Pre visit review using our clinic review tool, if applicable. No additional management support is needed unless otherwise documented below in the visit note.  Continue 1 tablet daily except take 1/2 tablet on Mondays and Thursdays. Recheck in 4 week at [facility].

## 2022-06-27 NOTE — Progress Notes (Signed)
Continue 1 tablet daily except take 1/2 tablet on Mondays and Thursdays. Recheck in 4 week at [facility].

## 2022-06-27 NOTE — Progress Notes (Signed)
Established Patient Office Visit  Subjective   Patient ID: DAISHAUN AYRE, male    DOB: 12-26-1940  Age: 81 y.o. MRN: [id number]  Chief Complaint  Patient presents with   Follow-up    HPI   Gwin is seen for medical follow-up accompanied by his sister-in-law.  He is generally doing well.  He is back working about 4-1/2 hours/day.  Has been exercising in cardiac rehab program at [facility] 3 times per week.  He has history of CAD with recent NSTEMI.  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  Currently on valsartan and Jardiance.  Cannot take beta-blocker secondary to baseline bradycardia.  Baseline pulse appears to be around 50.  He feels overall stable at this time.  Brings in log of home readings and consistently around 115 at home.  No orthopnea.  No peripheral edema.  Takes Lasix only as needed and very infrequently.  Recent thyroid stable on amiodarone.  Last LDL cholesterol 92.  Takes high-dose atorvastatin.  No myalgias.  He has atrial fibrillation and remains on Coumadin and followed through Coumadin clinic with stable INR earlier today.  History of TAVR 2016.  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 S/P Ant MI 1980;  b. 1997 S/P CABG x 8 (LIMA to diag-LAD, SVG to OM1-OM2-OM3, SVG to [facility] - Dr [name]);  c. 01/2015 Cath: 3VD, 8/8 patent grafts.   Cardiomyopathy    Dysrhythmia    Esophageal reflux    GERD (gastroesophageal reflux disease)    Headache    History of colonoscopy    History of transesophageal echocardiography (TEE) for monitoring    Other and unspecified hyperlipidemia    Paroxysmal atrial fibrillation ([place]) 02/20/2022   S/P angioplasty with stent 02/17/22 DES to proximal VG to PDA 02/20/2022   S/P TAVR (transcatheter  aortic valve replacement)    a. 03/2015 26 mm Edwards Sapien 3 transcatheter heart valve placed via open left transfemoral approach.   Severe aortic stenosis 08/10/2012   Skin lesions, generalized    facial which may represent actinic keratoses and possible photosensitivity from Amiodarone   Unspecified essential hypertension    Past Surgical History:  Procedure Laterality Date   CARDIAC CATHETERIZATION     CARDIOVERSION  11/18/2006   Dr. [name]   CATARACT EXTRACTION W/ INTRAOCULAR LENS IMPLANT  April '13  (Dr. [name])   left eye only   CHOLECYSTECTOMY     CORONARY ARTERY BYPASS GRAFT  02/09/1996   LIMA to diag-LAD, SVG to OM1-OM2-OM3, SVG to [facility]   CORONARY STENT INTERVENTION N/A 02/17/2022   Procedure: CORONARY STENT INTERVENTION;  Surgeon: Burnell Blanks, MD;  Location: [place] CV LAB;  Service: Cardiovascular;  Laterality: N/A;   CORONARY/GRAFT ANGIOGRAPHY N/A 02/17/2022   Procedure: CORONARY/GRAFT ANGIOGRAPHY;  Surgeon: Burnell Blanks, MD;  Location: [place] CV LAB;  Service: Cardiovascular;  Laterality: N/A;   EYE SURGERY     LEFT AND RIGHT HEART CATHETERIZATION WITH CORONARY/GRAFT ANGIOGRAM N/A 01/26/2015   Procedure: LEFT AND RIGHT HEART CATHETERIZATION WITH [name];  Surgeon: [name], MD;  Location: [facility] CATH LAB;  Service: Cardiovascular;  Laterality: N/A;   TEE WITHOUT CARDIOVERSION N/A 03/10/2015   Procedure: TRANSESOPHAGEAL ECHOCARDIOGRAM (  TEE);  Surgeon: [name], MD;  Location: [place];  Service: Open Heart Surgery;  Laterality: N/A;   TONSILLECTOMY     TRANSCATHETER AORTIC VALVE REPLACEMENT, TRANSFEMORAL N/A 03/10/2015   Procedure: TRANSCATHETER AORTIC VALVE REPLACEMENT, TRANSFEMORAL;  Surgeon: [name], MD;  Location: [place];  Service: Open Heart Surgery;  Laterality: N/A;    reports that he has never smoked. He has never used smokeless tobacco. He reports that he does not drink alcohol and does not use  drugs. family history includes Atrial fibrillation in his brother; Crohn's disease in his mother; Heart attack (age of onset: 42) in his father; Heart disease in his father and paternal uncle; Heart failure in his father; Prostate cancer in his paternal uncle; Stroke (age of onset: 53) in his mother. Allergies  Allergen Reactions   Amlodipine Other (See Comments)    Swelling?   Codeine Other (See Comments)    Pt does not remember   Iodine Swelling    Review of Systems  Constitutional:  Negative for chills and fever.  Respiratory:  Negative for cough, shortness of breath and wheezing.   Cardiovascular:  Negative for chest pain, orthopnea and leg swelling.  Neurological:  Negative for dizziness.      Objective:     BP (!) 136/58 (BP Location: Left Arm, Cuff Size: Normal)   Pulse (!) 53   Temp 98.1 F (36.7 C) (Oral)   Ht '5\' 1"'$  (1.549 m)   Wt 120 lb 12.8 oz (54.8 kg)   SpO2 97%   BMI 22.82 kg/m    Physical Exam Vitals reviewed.  Constitutional:      General: He is not in acute distress.    Appearance: Normal appearance. He is not ill-appearing.  HENT:     Head: Normocephalic and atraumatic.  Cardiovascular:     Comments: Bradycardic with rate around 50-54 Pulmonary:     Effort: Pulmonary effort is normal.     Breath sounds: Normal breath sounds. No wheezing or rales.  Musculoskeletal:     Right lower leg: No edema.     Left lower leg: No edema.  Neurological:     General: No focal deficit present.     Mental Status: He is alert.      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  Clinically  stable at this time.  Home weights have been very stable.  Remains on valsartan and and Jardiance.  Cannot take beta-blocker secondary to baseline bradycardia.  Tolerating supervised exercise program well  #2 hyperlipidemia.  Goal LDL less than 55.  Patient on high-dose atorvastatin.  Repeat lipid panel.  Consider addition of Zetia if LDL not further to goal  #3 history of atrial fibrillation.  Patient on Coumadin.  INR stable.  Continue close follow-up with Coumadin clinic. Patient remains on amiodarone.  Recent TSH stable.  Recent hepatic panel stable.   Return in about 6 months (around 12/28/2022).    [name], MD

## 2022-06-29 DIAGNOSIS — I252 Old myocardial infarction: Secondary | ICD-10-CM | POA: Diagnosis not present

## 2022-06-29 DIAGNOSIS — Z955 Presence of coronary angioplasty implant and graft: Secondary | ICD-10-CM | POA: Diagnosis not present

## 2022-07-01 DIAGNOSIS — Z955 Presence of coronary angioplasty implant and graft: Secondary | ICD-10-CM | POA: Diagnosis not present

## 2022-07-01 DIAGNOSIS — I252 Old myocardial infarction: Secondary | ICD-10-CM | POA: Diagnosis not present

## 2022-07-04 DIAGNOSIS — Z955 Presence of coronary angioplasty implant and graft: Secondary | ICD-10-CM | POA: Diagnosis not present

## 2022-07-04 DIAGNOSIS — I252 Old myocardial infarction: Secondary | ICD-10-CM | POA: Diagnosis not present

## 2022-07-04 MED ORDER — EZETIMIBE 10 MG PO TABS: 10.0000 mg | ORAL_TABLET | Freq: Every day | ORAL | 0 refills | Status: DC

## 2022-07-04 NOTE — Addendum Note (Signed)
Addended by: Nilda Riggs on: 07/04/2022 01:30 PM   Modules accepted: Orders

## 2022-07-05 DIAGNOSIS — S61210A Laceration without foreign body of right index finger without damage to nail, initial encounter: Secondary | ICD-10-CM | POA: Diagnosis not present

## 2022-07-06 DIAGNOSIS — Z955 Presence of coronary angioplasty implant and graft: Secondary | ICD-10-CM | POA: Diagnosis not present

## 2022-07-06 DIAGNOSIS — I252 Old myocardial infarction: Secondary | ICD-10-CM | POA: Diagnosis not present

## 2022-07-08 DIAGNOSIS — Z955 Presence of coronary angioplasty implant and graft: Secondary | ICD-10-CM | POA: Diagnosis not present

## 2022-07-08 DIAGNOSIS — I252 Old myocardial infarction: Secondary | ICD-10-CM | POA: Diagnosis not present

## 2022-07-11 ENCOUNTER — Encounter: Payer: Self-pay | Admitting: Family Medicine

## 2022-07-11 ENCOUNTER — Ambulatory Visit (INDEPENDENT_AMBULATORY_CARE_PROVIDER_SITE_OTHER): Payer: Medicare Other | Admitting: Family Medicine

## 2022-07-11 VITALS — BP 138/58 | HR 63 | Temp 98.2°F | Ht 61.0 in | Wt 122.1 lb

## 2022-07-11 DIAGNOSIS — I5022 Chronic systolic (congestive) heart failure: Secondary | ICD-10-CM | POA: Diagnosis not present

## 2022-07-11 DIAGNOSIS — E782 Mixed hyperlipidemia: Secondary | ICD-10-CM | POA: Diagnosis not present

## 2022-07-11 DIAGNOSIS — I10 Essential (primary) hypertension: Secondary | ICD-10-CM

## 2022-07-11 DIAGNOSIS — I252 Old myocardial infarction: Secondary | ICD-10-CM | POA: Diagnosis not present

## 2022-07-11 DIAGNOSIS — Z955 Presence of coronary angioplasty implant and graft: Secondary | ICD-10-CM | POA: Diagnosis not present

## 2022-07-11 NOTE — Progress Notes (Signed)
Established Patient Office Visit  Subjective   Patient ID: Jesus [name], male    DOB: [birth year]  Age: 81 y.o. MRN: [id number]  Chief Complaint  Patient presents with   Hypertension    HPI   Jesus [name] is seen for follow-up today with some questions about medications.  He had apparently losartan and valsartan on his med list.  He apparently has been taking both.  Generally feels well.  No dizziness.  Initial blood pressure today was up but improved after repeat reading.  He has history of CAD, atrial fibrillation, congestive heart failure, history of aortic stenosis.  He had TAVR 2016.  At CABG 1997 and angioplasty with stent 02-17-2022.  Doing overall fairly well at this time.  He is back to working part-time and does go to the gym for some exercise few times per week.  Recent lipid panel revealed LDL cholesterol of 84.  On atorvastatin 80 mg daily.  We added Zetia 10 mg daily and he is taking that with no side effects.  He is diligent with doing daily weights and checking home blood pressures and his weights have been extremely stable.  No orthopnea.  No dyspnea with exertion.  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 S/P Ant MI 1980;  b. 1997 S/P CABG x 8 (LIMA to diag-LAD, SVG to OM1-OM2-OM3, SVG to [facility] - Dr [name]);  c. 01/2015 Cath: 3VD, 8/8 patent grafts.   Cardiomyopathy    Dysrhythmia    Esophageal reflux    GERD (gastroesophageal reflux disease)    Headache    History of colonoscopy    History of transesophageal echocardiography (TEE) for monitoring    Other and unspecified hyperlipidemia    Paroxysmal atrial fibrillation ([place]) 02/20/2022   S/P angioplasty with stent 02/17/22 DES to proximal VG to PDA 02/20/2022   S/P TAVR (transcatheter  aortic valve replacement)    a. 03/2015 26 mm Edwards Sapien 3 transcatheter heart valve placed via open left transfemoral approach.   Severe aortic stenosis 08/10/2012   Skin lesions, generalized    facial which may represent actinic keratoses and possible photosensitivity from Amiodarone   Unspecified essential hypertension    Past Surgical History:  Procedure Laterality Date   CARDIAC CATHETERIZATION     CARDIOVERSION  11/18/2006   Dr. [name]   CATARACT EXTRACTION W/ INTRAOCULAR LENS IMPLANT  April '13  (Dr. [name])   left eye only   CHOLECYSTECTOMY     CORONARY ARTERY BYPASS GRAFT  02/09/1996   LIMA to diag-LAD, SVG to OM1-OM2-OM3, SVG to [facility]   CORONARY STENT INTERVENTION N/A 02/17/2022   Procedure: CORONARY STENT INTERVENTION;  Surgeon: Burnell Blanks, MD;  Location: [place] CV LAB;  Service: Cardiovascular;  Laterality: N/A;   CORONARY/GRAFT ANGIOGRAPHY N/A 02/17/2022   Procedure: CORONARY/GRAFT ANGIOGRAPHY;  Surgeon: Burnell Blanks, MD;  Location: [place] CV LAB;  Service: Cardiovascular;  Laterality: N/A;   EYE SURGERY     LEFT AND RIGHT HEART CATHETERIZATION WITH CORONARY/GRAFT ANGIOGRAM N/A 01/26/2015   Procedure: LEFT AND RIGHT HEART CATHETERIZATION WITH [name];  Surgeon: [name], MD;  Location: [facility] CATH LAB;  Service: Cardiovascular;  Laterality: N/A;   TEE WITHOUT CARDIOVERSION N/A  03/10/2015   Procedure: TRANSESOPHAGEAL ECHOCARDIOGRAM (TEE);  Surgeon: [name], MD;  Location: [place];  Service: Open Heart Surgery;  Laterality: N/A;   TONSILLECTOMY     TRANSCATHETER AORTIC VALVE REPLACEMENT, TRANSFEMORAL N/A 03/10/2015   Procedure: TRANSCATHETER AORTIC VALVE REPLACEMENT, TRANSFEMORAL;  Surgeon: [name], MD;  Location: [place];  Service: Open Heart Surgery;  Laterality: N/A;    reports that he has never smoked. He has never used smokeless tobacco. He reports that he does not drink alcohol and does not use  drugs. family history includes Atrial fibrillation in his brother; Crohn's disease in his mother; Heart attack (age of onset: 23) in his father; Heart disease in his father and paternal uncle; Heart failure in his father; Prostate cancer in his paternal uncle; Stroke (age of onset: 33) in his mother. Allergies  Allergen Reactions   Amlodipine Other (See Comments)    Swelling?   Codeine Other (See Comments)    Pt does not remember   Iodine Swelling    Review of Systems  Constitutional:  Negative for chills, fever and malaise/fatigue.  Eyes:  Negative for blurred vision.  Respiratory:  Negative for shortness of breath.   Cardiovascular:  Negative for chest pain.  Neurological:  Negative for dizziness, weakness and headaches.      Objective:     BP (!) 138/58 (BP Location: Left Arm, Cuff Size: Normal)   Pulse 63   Temp 98.2 F (36.8 C) (Oral)   Ht '5\' 1"'$  (1.549 m)   Wt 122 lb 1.6 oz (55.4 kg)   SpO2 98%   BMI 23.07 kg/m  BP Readings from Last 3 Encounters:  07/11/22 (!) 138/58  06/27/22 (!) 136/58  05/31/22 116/64   Wt Readings from Last 3 Encounters:  07/11/22 122 lb 1.6 oz (55.4 kg)  06/27/22 120 lb 12.8 oz (54.8 kg)  05/20/22 118 lb 9.6 oz (53.8 kg)      Physical Exam Vitals reviewed.  Constitutional:      Appearance: Normal appearance.  Cardiovascular:     Rate and Rhythm: Normal rate.  Pulmonary:     Effort: Pulmonary effort is normal.     Comments: He does have some bibasilar crackles.  O2 sat 98% at room air.  No wheezes. Musculoskeletal:     Right lower leg: No edema.     Left lower leg: No edema.  Neurological:     Mental Status: He is alert.      No results found for any visits on 07/11/22.    The ASCVD Risk score (Arnett DK, et al., 2019) failed to calculate for the following reasons:   The 2019 ASCVD risk score is only valid for ages 31 to 51   The patient has a prior MI or stroke diagnosis    Assessment & Plan:   #1 hyperlipidemia.   Goal LDL less than 55.  Recent LDL 84.  Recent addition of Zetia to high-dose atorvastatin.  Recheck lipid panel in about 6 to 8 weeks.  He just started the Zetia couple weeks ago.  #2 hypertension.  Repeat reading improved here today.  Patient has some confusion regarding medications.  He apparently at 1 point was taking losartan and valsartan.  .  We have made it clear to take the losartan off the med list but continue with his current dose of valsartan.   -Continue regular exercise -Continue to watch sodium intake  #3 congestive heart failure.  Appears to be euvolemic at this time.  Home  blood pressures very stable.   No follow-ups on file.    [name], MD

## 2022-07-11 NOTE — Patient Instructions (Signed)
Be sure to STOP the Losartan  Stay on all other medications  Make sure to follow up in a couple of months so we can recheck the lipid panel.

## 2022-07-13 DIAGNOSIS — I252 Old myocardial infarction: Secondary | ICD-10-CM | POA: Diagnosis not present

## 2022-07-13 DIAGNOSIS — Z955 Presence of coronary angioplasty implant and graft: Secondary | ICD-10-CM | POA: Diagnosis not present

## 2022-07-15 DIAGNOSIS — I252 Old myocardial infarction: Secondary | ICD-10-CM | POA: Diagnosis not present

## 2022-07-15 DIAGNOSIS — Z955 Presence of coronary angioplasty implant and graft: Secondary | ICD-10-CM | POA: Diagnosis not present

## 2022-07-18 DIAGNOSIS — I252 Old myocardial infarction: Secondary | ICD-10-CM | POA: Diagnosis not present

## 2022-07-18 DIAGNOSIS — Z955 Presence of coronary angioplasty implant and graft: Secondary | ICD-10-CM | POA: Diagnosis not present

## 2022-07-25 ENCOUNTER — Telehealth: Payer: Self-pay | Admitting: Cardiovascular Disease

## 2022-07-25 NOTE — Telephone Encounter (Signed)
*  STAT* If patient is at the pharmacy, call can be transferred to refill team.   1. Which medications need to be refilled? (please list name of each medication and dose if known) amoxicillin (AMOXIL) 500 MG capsule  2. Which pharmacy/location (including street and city if local pharmacy) is medication to be sent to? [facility] - [place]  3. Do they need a 30 day or 90 day supply? 30  This would be for pt's dental procedures.

## 2022-07-25 NOTE — Telephone Encounter (Signed)
Called to dental office to inform them pt's PCP has been refilling pt's amoxicillin for him and that they needed to called pt's PCP, because pt's PCP just sent in a refill in July 2023. The dental office verbalized understanding.

## 2022-07-26 ENCOUNTER — Ambulatory Visit: Payer: Medicare Other

## 2022-07-26 ENCOUNTER — Telehealth: Payer: Self-pay | Admitting: Pharmacist

## 2022-07-26 DIAGNOSIS — Z7901 Long term (current) use of anticoagulants: Secondary | ICD-10-CM

## 2022-07-26 LAB — POCT INR: INR: 3.6 — AB (ref 2.0–3.0)

## 2022-07-26 NOTE — Patient Instructions (Addendum)
Pre visit review using our clinic review tool, if applicable. No additional management support is needed unless otherwise documented below in the visit note.  Hold dose today and then chang weekly dose to take 1 tablet daily except take 1/2 tablet on Mondays, Wednesday, and Friday. Recheck in 3 week at [facility].

## 2022-07-26 NOTE — Chronic Care Management (AMB) (Signed)
    Chronic Care Management Pharmacy Assistant   Name: Jesus [name]  MRN: [id number] DOB: [birth year]  07/27/2022 APPOINTMENT REMINDER  Called [name], No answer, left message of appointment on 07/27/2022 at 3:30 via telephone visit with [name], Pharm D. Notified to have all medications, supplements, blood pressure and/or blood sugar logs available during appointment and to return call if need to reschedule.  Care Gaps: AWV - scheduled 07/28/2022 Last BP - 138/58 on 07/11/2022 Covid booster - overdue Flu - due  Star Rating Drug: Atorvastatin 80 mg - last filled 05/21/2022 90 DS at [facility] Jardiance 10 mg - last filled 06/26/2022 30 DS [facility]  Valsartan 40 mg - last filled 07/02/2022 90 DS at [facility]  Any gaps in medications fill history? No  [name]  Catering manager [phone]

## 2022-07-26 NOTE — Progress Notes (Addendum)
Hold dose today and then chang weekly dose to take 1 tablet daily except take 1/2 tablet on Mondays, Wednesday, and Friday. Recheck in 3 week at [facility].   Pt requested BP check. BP today 108/58, P 58, O2 sat 95%

## 2022-07-27 ENCOUNTER — Ambulatory Visit (INDEPENDENT_AMBULATORY_CARE_PROVIDER_SITE_OTHER): Payer: Medicare Other | Admitting: Pharmacist

## 2022-07-27 DIAGNOSIS — E782 Mixed hyperlipidemia: Secondary | ICD-10-CM

## 2022-07-27 DIAGNOSIS — I10 Essential (primary) hypertension: Secondary | ICD-10-CM

## 2022-07-27 NOTE — Progress Notes (Signed)
Chronic Care Management Pharmacy Note  07/29/2022 Name:  Jesus [name] MRN:  [id number] DOB:  1941/12/02  Summary: BP is at goal < 130/80 LDL is above goal < 70  Recommendations/Changes made from today's visit: -Recommend repeat lipid panel to assess effectiveness of Zetia -Recommended for pt to bring BP cuff to next office visit to ensure accuracy  Plan: Follow up BP assessment in 2-3 months Scheduled lipid panel Follow up in 6 months  Subjective: Jesus [name] is an 81 y.o. year old male who is a primary patient of [name], [name], MD.  The CCM team was consulted for assistance with disease management and care coordination needs.    Engaged with patient by telephone for follow up visit in response to provider referral for pharmacy case management and/or care coordination services.   Consent to Services:  The patient was given information about Chronic Care Management services, agreed to services, and gave verbal consent prior to initiation of services.  Please see initial visit note for detailed documentation.   Patient Care Team: [name], MD as PCP - General (Family Medicine) [name], MD as PCP - Cardiology (Cardiology) [name], MD as PCP - Electrophysiology (Cardiology) [name], [facility] as Pharmacist (Pharmacist)  Recent office visits: 07/11/22 [name], MD: Patient presented for HTN follow up. Patient was taking both valsartan and losartan and removed losartan from list. Follow up in a couple of months for repeat lipid panel.  06/27/22 [name], MD: Patient presented for HLD follow up.  Prescribed Zetia 10 mg daily.  06/27/22 [name], RN: Patient presented for anti-coag visit. INR 2.6, goal 2-3. Continued 2 mg (4 mg x 0.5) every Mon, Thu; 4 mg (4 mg x 1) all other days.  05/20/22 [name], FNP: Patient presented for a cough. Prescribed benzonatate.   04/05/2022 [name] MD - Patient was seen for NSTEMI  (non-ST elevated myocardial infarction). No medication changes. No follow up noted.    03/28/2022 [name] MD - Patient was seen concerns. No medication changes. Follow up in 3 months.  02/23/2022 [name] MD - Patient was seen for acute kidney injury and additional issues. No medication changes. No follow up noted.   Recent consult visits: 05/09/22 [name], MD (cardiology): Patient presented for CAD and Afib follow up. Follow up in 4 months.  04/04/2022 [name] MD (cardiology) - Patient was seen for abnormal lung sounds and additional concerns. Continued valsartan 40 mg daily. Follow up in 6 months.    03/29/2022 [name] NP (heart care) - Patient was seen for persistent atrial fibrillation and additional concerns. Started valsartan 40 mg. Follow up in 1 week.   Hospital visits: Admitted to [facility] on 03/08/2022 due to debility and additional issues. Discharge date was 03/15/2022.  New?Medications Started at [facility] Discharge:?? acetaminophen (TYLENOL) lidocaine (LIDODERM) melatonin methocarbamol (ROBAXIN) Medication Changes at Hospital Discharge: amiodarone (PACERONE) furosemide (LASIX) warfarin (COUMADIN) Medications Discontinued at Hospital Discharge: amoxicillin 400 MG/5ML suspension (AMOXIL) aspirin 81 MG tablet metoprolol succinate 25 MG 24 hr tablet (TOPROL-XL) spironolactone 25 MG tablet (ALDACTONE) Medications that remain the same after Hospital Discharge:??  -All other medications will remain the same.       Admitted to [facility] on 02/27/2022 due to Acute on chronic systolic congestive hear failure. Discharge date was 03/08/2022.    New?Medications Started at [facility] Discharge:?? empagliflozin (JARDIANCE) furosemide (LASIX) Start taking on: March 09, 2022 metoprolol succinate (TOPROL-XL) spironolactone (  ALDACTONE) Medication Changes at Hospital Discharge: amiodarone (PACERONE) Medications  Discontinued at Hospital Discharge: amLODipine 10 MG tablet (NORVASC) losartan 50 MG tablet (COZAAR) metoprolol tartrate 25 MG tablet (LOPRESSOR) Medications that remain the same after Hospital Discharge:??  -All other medications will remain the same.       Patient was seen at [facility] ED on 02/24/2022 (3 hrs) due to left foot pain.  New?Medications Started at [facility] Discharge:?? No medication started Medication Changes at Hospital Discharge: No medication changes Medications Discontinued at Hospital Discharge: No medications discontinued Medications that remain the same after Hospital Discharge:??  -All other medications will remain the same.       Admitted to [facility] on 02/15/2022 due to non ST elevated myocardial infarction. Discharge date was 02/20/2022.    New?Medications Started at [facility] Discharge:?? clopidogrel (PLAVIX) Start taking on: February 21, 2022 metoprolol tartrate (LOPRESSOR) nitroGLYCERIN (NITROSTAT) Medication Changes at Hospital Discharge: amiodarone (PACERONE) amLODipine (NORVASC) atorvastatin (LIPITOR) losartan (COZAAR) Medications Discontinued at Hospital Discharge: No medications discontinued Medications that remain the same after Hospital Discharge:??  -All other medications will remain the same.       Patient was seen at [facility] on 01/10/2022 due to palpitations and additional issue.  New?Medications Started at [facility] Discharge:?? No medication started Medication Changes at Hospital Discharge: No medication changes Medications Discontinued at Hospital Discharge: No medications discontinued Medications that remain the same after Hospital Discharge:??  -All other medications will remain the same.    Objective:  Lab Results  Component Value Date   CREATININE 1.43 (H) 04/11/2022   BUN 21 04/11/2022   GFR 47.03 (L) 02/23/2022   GFRNONAA 44 (L) 03/21/2022   GFRAA 90 11/04/2020   NA  141 04/11/2022   K 4.8 04/11/2022   CALCIUM 9.0 04/11/2022   CO2 23 04/11/2022   GLUCOSE 113 (H) 04/11/2022    Lab Results  Component Value Date/Time   HGBA1C 5.9 (H) 02/15/2022 10:56 AM   HGBA1C 6.0 (H) 03/06/2015 02:45 PM   GFR 47.03 (L) 02/23/2022 04:07 PM   GFR 104.41 01/21/2015 08:27 AM    Last diabetic Eye exam: No results found for: "HMDIABEYEEXA"  Last diabetic Foot exam: No results found for: "HMDIABFOOTEX"   Lab Results  Component Value Date   CHOL 152 06/27/2022   HDL 49.00 06/27/2022   LDLCALC 84 06/27/2022   TRIG 94.0 06/27/2022   CHOLHDL 3 06/27/2022       Latest Ref Rng & Units 03/09/2022    5:05 AM 02/28/2022   12:58 AM 11/15/2021    8:53 AM  Hepatic Function  Total Protein 6.5 - 8.1 g/dL 6.3  6.0  6.7   Albumin 3.5 - 5.0 g/dL 2.8  3.0  4.3   AST 15 - 41 U/L 22  35  25   ALT 0 - 44 U/L 31  46  22   Alk Phosphatase 38 - 126 U/L 78  79  120   Total Bilirubin 0.3 - 1.2 mg/dL 0.3  0.9  0.4   Bilirubin, Direct 0.00 - 0.40 mg/dL   0.16     Lab Results  Component Value Date/Time   TSH 2.650 04/04/2022 02:57 PM   TSH 4.320 11/15/2021 08:53 AM   FREET4 1.29 03/20/2017 02:41 PM   FREET4 0.9 10/19/2009 02:40 PM       Latest Ref Rng & Units 03/29/2022    4:01 PM 03/13/2022   11:57 PM 03/11/2022    5:06 AM  CBC  WBC 3.4 - 10.8 x10E3/uL 7.1  6.9  8.8   Hemoglobin 13.0 - 17.7 g/dL 12.5  11.2  11.4   Hematocrit 37.5 - 51.0 % 37.2  34.1  33.8   Platelets 150 - 450 x10E3/uL 146  203  223     No results found for: "VD25OH"  Clinical ASCVD: Yes  The ASCVD Risk score (Arnett DK, et al., 2019) failed to calculate for the following reasons:   The 2019 ASCVD risk score is only valid for ages 3 to 42   The patient has a prior MI or stroke diagnosis       07/28/2022    3:02 PM 02/23/2022    4:53 PM 06/21/2021    1:20 PM  Depression screen PHQ 2/9  Decreased Interest 0 0 0  Down, Depressed, Hopeless 0 0 0  PHQ - 2 Score 0 0 0  Altered sleeping  0   Tired,  decreased energy  0   Change in appetite  0   Feeling bad or failure about yourself   0   Trouble concentrating  0   Moving slowly or fidgety/restless  0   Suicidal thoughts  0   PHQ-9 Score  0      CHA2DS2/VAS Stroke Risk Points  Current as of 3 minutes ago     5 >= 2 Points: High Risk  1 - 1.99 Points: Medium Risk  0 Points: Low Risk    Last Change: N/A      Details    This score determines the patient's risk of having a stroke if the  patient has atrial fibrillation.       Points Metrics  1 Has Congestive Heart Failure:  Yes    Current as of 3 minutes ago  1 Has Vascular Disease:  Yes    Current as of 3 minutes ago  1 Has Hypertension:  Yes    Current as of 3 minutes ago  2 Age:  64    Current as of 3 minutes ago  0 Has Diabetes:  No    Current as of 3 minutes ago  0 Had Stroke:  No  Had TIA:  No  Had Thromboembolism:  No    Current as of 3 minutes ago  0 Male:  No    Current as of 3 minutes ago         Social History   Tobacco Use  Smoking Status Never  Smokeless Tobacco Never  Tobacco Comments   Does not smoke.   BP Readings from Last 3 Encounters:  07/26/22 (!) 108/58  07/11/22 (!) 138/58  06/27/22 (!) 136/58   Pulse Readings from Last 3 Encounters:  07/26/22 (!) 58  07/11/22 63  06/27/22 (!) 53   Wt Readings from Last 3 Encounters:  07/28/22 122 lb (55.3 kg)  07/11/22 122 lb 1.6 oz (55.4 kg)  06/27/22 120 lb 12.8 oz (54.8 kg)   BMI Readings from Last 3 Encounters:  07/28/22 23.05 kg/m  07/11/22 23.07 kg/m  06/27/22 22.82 kg/m    Assessment/Interventions: Review of patient past medical history, allergies, medications, health status, including review of consultants reports, laboratory and other test data, was performed as part of comprehensive evaluation and provision of chronic care management services.   SDOH:  (Social Determinants of Health) assessments and interventions performed: Yes SDOH Interventions    Flowsheet Row Most  Recent Value  SDOH Interventions   Financial Strain Interventions Intervention Not Indicated  SDOH Screenings   Alcohol Screen: Low Risk  (07/28/2022)   Alcohol Screen    Last Alcohol Screening Score (AUDIT): 0  Depression (PHQ2-9): Low Risk  (07/28/2022)   Depression (PHQ2-9)    PHQ-2 Score: 0  Financial Resource Strain: Low Risk  (07/28/2022)   Overall Financial Resource Strain (CARDIA)    Difficulty of Paying Living Expenses: Not hard at all  Food Insecurity: No Food Insecurity (07/28/2022)   Hunger Vital Sign    Worried About Running Out of Food in the Last Year: Never true    Ran Out of Food in the Last Year: Never true  Housing: Low Risk  (07/28/2022)   Housing    Last Housing Risk Score: 0  Physical Activity: Sufficiently Active (07/28/2022)   Exercise Vital Sign    Days of Exercise per Week: 3 days    Minutes of Exercise per Session: 60 min  Social Connections: Moderately Integrated (07/28/2022)   Social Connection and Isolation Panel [NHANES]    Frequency of Communication with Friends and Family: More than three times a week    Frequency of Social Gatherings with Friends and Family: More than three times a week    Attends Religious Services: More than 4 times per year    Active Member of [employer] or Organizations: Yes    Attends Archivist Meetings: More than 4 times per year    Marital Status: Widowed  Stress: No Stress Concern Present (07/28/2022)   [place]    Feeling of Stress : Not at all  Tobacco Use: Low Risk  (07/28/2022)   Patient History    Smoking Tobacco Use: Never    Smokeless Tobacco Use: Never    Passive Exposure: Not on file  Transportation Needs: No Transportation Needs (07/28/2022)   PRAPARE - Transportation    Lack of Transportation (Medical): No    Lack of Transportation (Non-Medical): No    CCM Care Plan  Allergies  Allergen Reactions   Amlodipine Other (See  Comments)    Swelling?   Codeine Other (See Comments)    Pt does not remember   Iodine Swelling    Medications Reviewed Today     Reviewed by Criselda Peaches, LPN (Licensed Practical Nurse) on 07/28/22 at 1456  Med List Status: <None>   Medication Order Taking? Sig Documenting Provider Last Dose Status Informant  acetaminophen (TYLENOL) 325 MG tablet [id number] No Take 1-2 tablets (325-650 mg total) by mouth every 4 (four) hours as needed for mild pain. Bary Leriche, PA-C Taking Active   albuterol (VENTOLIN HFA) 108 (90 Base) MCG/ACT inhaler [id number] No INHALE 2 PUFFS BY MOUTH EVERY 4 HOURS ASNEEDED FOR WHEEZING OR SHORTNESS OF BREATH. [name], MD Taking Active Family Member  amiodarone (PACERONE) 200 MG tablet [id number] No Take 1 tablet (200 mg total) by mouth daily. Bary Leriche, PA-C Taking Active   amoxicillin (AMOXIL) 500 MG capsule [id number] No Take 4 tablets 1 hour prior to dental procedure [name], MD Taking Active   Ascorbic Acid (VITAMIN C) 1000 MG tablet [id number] No Take 1,000 mg by mouth daily. [provider] Taking Active Family Member  atorvastatin (LIPITOR) 80 MG tablet [id number] No Take 1 tablet (80 mg total) by mouth daily. [name], MD Taking Active   clopidogrel (PLAVIX) 75 MG tablet [id number] No Take 1 tablet (75 mg total) by mouth daily with breakfast. [name], MD Taking Active  diclofenac Sodium (VOLTAREN) 1 % GEL [id number] No Apply 4 g topically 4 (four) times daily.  Patient taking differently: Apply 4 g topically 4 (four) times daily. As needed   Deno Etienne, DO Taking Active Family Member  empagliflozin (JARDIANCE) 10 MG TABS tablet [id number] No Take 1 tablet (10 mg total) by mouth daily. [name], MD Taking Active   ezetimibe (ZETIA) 10 MG tablet [id number] No Take 1 tablet (10 mg total) by mouth daily. [name], MD Taking Active   furosemide (LASIX) 20 MG tablet [id number] No Take 1 tablet  (20 mg total) by mouth daily as needed. For swelling in the legs, shortness of breath or if your weight goes up by 2 lbs overnight. Call cardiology for input. Bary Leriche, PA-C Taking Active   loratadine (CLARITIN) 10 MG tablet [id number] No Take 10 mg by mouth daily as needed for allergies. [provider] Taking Active Family Member  melatonin 5 MG TABS [id number] No Take 1 tablet (5 mg total) by mouth at bedtime as needed (sleep). Bary Leriche, PA-C Taking Active   Multiple Vitamins-Minerals (MULTIVITAMIN,TX-MINERALS) tablet [id number] No Take 1 tablet by mouth daily. [provider] Taking Active Family Member  nitroGLYCERIN (NITROSTAT) 0.4 MG SL tablet [id number] No Place 1 tablet (0.4 mg total) under the tongue every 5 (five) minutes x 3 doses as needed for chest pain. Isaiah Serge, NP Taking Active Family Member  valsartan (DIOVAN) 40 MG tablet [id number] No Take 1 tablet (40 mg total) by mouth daily. 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 ([place]) 03/01/2022   Acute on chronic systolic congestive heart failure ([place]) 02/27/2022   Paroxysmal atrial fibrillation ([place]) 02/20/2022   S/P angioplasty with stent 02/17/22 DES to proximal VG to PDA 02/20/2022   NSTEMI (non-ST elevated myocardial infarction) ([place]) 50/27/7412   Chronic systolic CHF (congestive heart failure) ([place]) 04/16/2018   Long term (current) use of anticoagulants 08/25/2017   Actinic  keratoses 03/21/2017   Internal hemorrhoid, bleeding 07/01/2015   Rectal bleeding 06/15/2015   Internal hemorrhoids 06/15/2015   Other constipation 06/15/2015   Chronic anticoagulation-Couamdin 03/12/2015   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   Iron deficiency anemia 09/03/2014   Encounter for therapeutic drug monitoring 12/31/2013   Yellow jacket sting 08/06/2013   Low back pain on right side with sciatica 04/24/2013   Severe aortic stenosis 08/10/2012   Coronary artery disease involving coronary bypass graft of native heart without angina pectoris 08/10/2012   Left ventricular dysfunction 08/10/2012   Routine health maintenance 07/12/2012   BEE STING REACTION, LOCAL 09/21/2010   BEN LOC HYPERPLASIA PROS W/UR OBST & OTH LUTS 08/02/2010   Acute and chronic cholecystitis 02/01/2010   Abdominal pain, generalized 01/29/2010   CORONARY ATHEROSCLEROSIS NATIVE CORONARY ARTERY 10/13/2009   HYPERTHYROIDISM 05/22/2009   INSOMNIA 05/22/2009   Mixed hyperlipidemia 04/22/2009   Cardiomyopathy, ischemic-EF 35% 04/22/2009   CAD,  AUTOLOGOUS BYPASS GRAFT 10/16/2008   Essential hypertension 09/07/2007   Aortic valve disorder 09/07/2007   GERD without esophagitis 09/07/2007   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    Immunization History  Administered Date(s) Administered   Fluad Quad(high Dose 65+) 10/04/2019, 10/08/2020   Influenza Whole 09/05/2001, 09/26/2008, 09/26/2009, 09/14/2010   Influenza, High Dose Seasonal PF 08/12/2015, 09/02/2016, 08/25/2017, 09/07/2018, 08/24/2021   Influenza,inj,Quad PF,6+ Mos 08/27/2014   Influenza-Unspecified 09/12/2012, 08/16/2013   PFIZER(Purple Top)SARS-COV-2 Vaccination 01/06/2020, 01/20/2020, 11/23/2020   Pneumococcal Conjugate-13 04/07/2014   Pneumococcal Polysaccharide-23 09/05/2001, 01/14/2010   Td 01/06/1999   Tetanus 05/15/2013   Zoster Recombinat (Shingrix) 09/06/2019, 02/19/2020   Zoster, Live 12/14/2011   Patient is using a  pillbox for Sunday through Monday. Patient is filling this on his own and denies missed doses of medications.  Patient just cracked a tooth recently. Patient went to 3 different dentists in order to get his tooth fixed. He has to go back to the dentist in about a month to clean it out and get the root out.   Conditions to be addressed/monitored:  Hypertension, Hyperlipidemia, Atrial Fibrillation, Heart Failure, GERD, Osteopenia, Allergic Rhinitis, and Pain  Conditions addressed this visit: Hypertension, Afib, hyperlipidemia   Care Plan : CCM Pharmacy Care Plan  Updates made by [name], [place] since 07/29/2022 12:00 AM     Problem: Problem: Hypertension, Hyperlipidemia, Atrial Fibrillation, Heart Failure, GERD, Osteopenia, Allergic Rhinitis, and Pain      Long-Range Goal: Patient Stated   Start Date: 05/28/2021  Expected End Date: 05/28/2022  Recent Progress: On track  Priority: High  Note:   Current Barriers:  Unable to independently monitor therapeutic efficacy  Pharmacist Clinical Goal(s):  Patient will achieve adherence to monitoring guidelines and medication adherence to achieve therapeutic efficacy through collaboration with PharmD and provider.   Interventions: 1:1 collaboration with [name], MD regarding development and update of comprehensive plan of care as evidenced by provider attestation and co-signature Inter-disciplinary care team collaboration (see longitudinal plan of care) Comprehensive medication review performed; medication list updated in electronic medical record  Hypertension (BP goal <130/80) -Controlled -Current treatment: Valsartan 40 mg 1 tablet daily - in AM - Appropriate, Effective, Safe, Accessible -Medications previously tried: amlodipine, losartan (switched to valsartan) -Current home readings: 129/69 (nurse at his house) - does not check but has an arm cuff; did bring his cuff to office  -Current dietary habits: did not  discuss -Current exercise habits: did not discuss -Denies hypotensive/hypertensive symptoms -Educated on BP goals and benefits of medications for prevention of heart attack, stroke and kidney damage; Importance of home blood pressure monitoring; Proper BP monitoring technique; -Counseled to monitor BP at home weekly, document, and provide log at future appointments -Counseled on diet and exercise extensively Recommended to continue current medication  Hyperlipidemia: (LDL goal < 70) -Not ideally controlled -Current treatment: Atorvastatin 80 mg 1 tablet daily - Appropriate, Query effective, Safe, Accessible Zetia 10 mg 1 tablet daily - Appropriate, Query effective, Safe, Accessible -Medications previously tried: none  -Current dietary patterns: did not discuss -Current exercise habits: did not discuss -Educated on Cholesterol goals;  Benefits of statin for ASCVD risk reduction; Importance of limiting foods high in cholesterol; -Counseled on diet and exercise extensively Recommended to continue current medication Recommended repeat lipid panel and may consider dose increase to lower LDL to goal < 70.  CAD (Goal: prevent heart events) -Controlled -Current treatment  Atorvastatin 80 mg 1 tablet daily - Appropriate, Effective, Safe, Accessible Clopidogrel 75  mg 1 tablet daily - Appropriate, Effective, Safe, Accessible -Medications previously tried: aspirin -Recommended to continue current medication  Atrial Fibrillation (Goal: prevent stroke and major bleeding) -Controlled -CHADSVASC: 4 -Current treatment: Rhythm control: Amiodarone 200 mg 1 tablet daily - Appropriate, Effective, Safe, Accessible Anticoagulation: Warfarin 4 mg as directed - Appropriate, Effective, Safe, Accessible -Medications previously tried: none -Home BP and HR readings: does not check (HR 60-70s) -Counseled on increased risk of stroke due to Afib and benefits of anticoagulation for stroke  prevention; importance of adherence to anticoagulant exactly as prescribed; -Counseled on diet and exercise extensively Recommended to continue current medication Educated on foods that can affect INR  Heart Failure (Goal: manage symptoms and prevent exacerbations) -Controlled -Last ejection fraction: 40-45% (Date: 05/01/20) -HF type: Left Ventricular Failure -NYHA Class: II (slight limitation of activity) -AHA HF Stage: B (Heart disease present - no symptoms present) -Current treatment: Valsartan 40 mg 1 tablet daily - Appropriate, Effective, Safe, Accessible Amiodarone 200 mg 1 tablet daily - Appropriate, Effective, Safe, Accessible Jardiance 10 mg 1 tablet daily - Appropriate, Effective, Safe, Accessible Furosemide 20 mg 1 tablet daily as needed - Appropriate, Effective, Safe, Accessible -Medications previously tried: none  -Current home BP/HR readings: does not check -Current dietary habits: did not discuss -Current exercise habits: did not discuss -Educated on Benefits of medications for managing symptoms and prolonging life Importance of blood pressure control -Counseled on diet and exercise extensively Recommended to continue current medication  Osteopenia (Goal prevent fractures) -Not ideally controlled -Last DEXA Scan: has not completed (noted on Xray)   T-Score femoral neck: n/a  T-Score total hip: n/a  T-Score lumbar spine: n/a  T-Score forearm radius: n/a  10-year probability of major osteoporotic fracture: n/a  10-year probability of hip fracture: n/a -Patient is not a candidate for pharmacologic treatment -Current treatment  No medications -Medications previously tried: none  -Recommend [phone] units of vitamin D daily. Recommend 1200 mg of calcium daily from dietary and supplemental sources. Recommend weight-bearing and muscle strengthening exercises for building and maintaining bone density. - Consider DEXA scan  GERD (Goal: minimize  symptoms) -Controlled -Current treatment  Tums as needed - Appropriate, Effective, Safe, Accessible Pepcid 20 mg as needed - Appropriate, Effective, Safe, Accessible -Medications previously tried: none  - Patient alternates Pepcid and Zantac  Allergic rhinitis (Goal: minimize symptoms) -Controlled -Current treatment  Claritin 10 mg 1 tablet daily as needed - patient takes about 3 x month - Appropriate, Effective, Safe, Accessible -Medications previously tried: none  -Recommended to continue current medication  Pain (Goal: minimize pain) -Controlled -Current treatment  Tylenol 500 mg daily (patient takes 2 x month) - Appropriate, Effective, Safe, Accessible -Medications previously tried: none  -Counseled on avoiding NSAIDs for pain while taking anticoagulants; maximum daily dose of Tylenol of 3,000 mg/day   Health Maintenance -Vaccine gaps: COVID booster -Current therapy:  Amoxicillin 500 mg 4 capsules Amoxicillin suspension Multivitamin 1 tablet daily Vitamin C 1000 mg 1 tablet daily -Educated on Cost vs benefit of each product must be carefully weighed by individual consumer -Patient is satisfied with current therapy and denies issues -Recommended to continue current medication  Patient Goals/Self-Care Activities Patient will:  - take medications as prescribed check blood pressure weekly, document, and provide at future appointments  Follow Up Plan: Telephone follow up appointment with care management team member scheduled for: 6 months        Medication Assistance: None required.  Patient affirms current coverage meets needs.  Compliance/Adherence/Medication fill history: Care  Gaps: COVID booster, influenza Last BP - 138/58 on 07/11/2022  Star-Rating Drugs: Atorvastatin 80 mg - last filled 05/21/2022 90 DS at [facility] Jardiance 10 mg - last filled 06/26/2022 30 [facility] at [facility]  Valsartan 40 mg - last filled 07/02/2022 90 DS at [facility]  Patient's preferred  pharmacy is:  [place], [place] [address] Phone: [phone] Fax: [phone]   Uses pill box? Yes - 1 week at a time; morning, lunch, and supper (3 different pill boxes) Pt endorses 95% compliance - sometimes misses aspirin and vitamin C  We discussed: Current pharmacy is preferred with insurance plan and patient is satisfied with pharmacy services Patient decided to: Continue current medication management strategy  Care Plan and Follow Up Patient Decision:  Patient agrees to Care Plan and Follow-up.  Plan: Telephone follow up appointment with care management team member scheduled for:  6 months  [name], PharmD, [place] Pharmacist [facility] [phone]

## 2022-07-28 ENCOUNTER — Ambulatory Visit (INDEPENDENT_AMBULATORY_CARE_PROVIDER_SITE_OTHER): Payer: Medicare Other

## 2022-07-28 VITALS — Ht 61.0 in | Wt 122.0 lb

## 2022-07-28 DIAGNOSIS — Z0000 Encounter for general adult medical examination without abnormal findings: Secondary | ICD-10-CM

## 2022-07-28 NOTE — Progress Notes (Signed)
Subjective:   Jesus [name] is a 81 y.o. male who presents for Medicare Annual/Subsequent preventive examination.  Review of Systems    Virtual Visit via Telephone Note  I connected with  Jesus [name] on 07/28/22 at  2:30 PM EDT by telephone and verified that I am speaking with the correct person using two identifiers.  Location: Patient: Home Provider: Office Persons participating in the virtual visit: patient/Nurse Health Advisor   I discussed the limitations, risks, security and privacy concerns of performing an evaluation and management service by telephone and the availability of in person appointments. The patient expressed understanding and agreed to proceed.  Interactive audio and video telecommunications were attempted between this nurse and patient, however failed, due to patient having technical difficulties OR patient did not have access to video capability.  We continued and completed visit with audio only.  Some vital signs [name] be absent or patient reported.   Jesus Peaches, LPN  Cardiac Risk Factors include: advanced age (>34mn, >>41women);hypertension;male gender     Objective:    Today's Vitals   07/28/22 1458  Weight: 122 lb (55.3 kg)  Height: '5\' 1"'$  (1.549 m)   Body mass index is 23.05 kg/m.     07/28/2022    3:06 PM 03/08/2022    3:37 PM 02/27/2022    6:10 PM 02/27/2022    4:18 PM 02/24/2022    6:37 PM 02/19/2022   11:00 AM 06/21/2021    1:18 PM  Advanced Directives  Does Patient Have a Medical Advance Directive? Yes Yes Yes  Yes Yes Yes  Type of AParamedicof ANew [place] will [place] will [place] will Living will;Healthcare Power of [place] will  Does patient want to make changes to medical advance directive? No - Patient declined No - Patient declined    No - Patient declined   Copy of  [place] Chart? No - copy requested No - copy requested    No - copy requested No - copy requested    Current Medications (verified) Outpatient Encounter Medications as of 07/28/2022  Medication Sig   acetaminophen (TYLENOL) 325 MG tablet Take 1-2 tablets (325-650 mg total) by mouth every 4 (four) hours as needed for mild pain.   albuterol (VENTOLIN HFA) 108 (90 Base) MCG/ACT inhaler INHALE 2 PUFFS BY MOUTH EVERY 4 HOURS ASNEEDED FOR WHEEZING OR SHORTNESS OF BREATH.   amiodarone (PACERONE) 200 MG tablet Take 1 tablet (200 mg total) by mouth daily.   amoxicillin (AMOXIL) 500 MG capsule Take 4 tablets 1 hour prior to dental procedure   Ascorbic Acid (VITAMIN C) 1000 MG tablet Take 1,000 mg by mouth daily.   atorvastatin (LIPITOR) 80 MG tablet Take 1 tablet (80 mg total) by mouth daily.   clopidogrel (PLAVIX) 75 MG tablet Take 1 tablet (75 mg total) by mouth daily with breakfast.   diclofenac Sodium (VOLTAREN) 1 % GEL Apply 4 g topically 4 (four) times daily. (Patient taking differently: Apply 4 g topically 4 (four) times daily. As needed)   empagliflozin (JARDIANCE) 10 MG TABS tablet Take 1 tablet (10 mg total) by mouth daily.   ezetimibe (ZETIA) 10 MG tablet Take 1 tablet (10 mg total) by mouth daily.   furosemide (LASIX) 20 MG tablet Take 1 tablet (20 mg total) by mouth daily as needed. For swelling in the legs, shortness of breath or if  your weight goes up by 2 lbs overnight. Call cardiology for input.   loratadine (CLARITIN) 10 MG tablet Take 10 mg by mouth daily as needed for allergies.   melatonin 5 MG TABS Take 1 tablet (5 mg total) by mouth at bedtime as needed (sleep).   Multiple Vitamins-Minerals (MULTIVITAMIN,TX-MINERALS) tablet Take 1 tablet by mouth daily.   nitroGLYCERIN (NITROSTAT) 0.4 MG SL tablet Place 1 tablet (0.4 mg total) under the tongue every 5 (five) minutes x 3 doses as needed for chest pain.   valsartan (DIOVAN) 40 MG tablet Take 1 tablet (40 mg  total) by mouth daily.   warfarin (COUMADIN) 4 MG tablet TAKE 1 TABLET BY MOUTH DAILY EXCEPT TAKE 1/2 TABLET ON MONDAYS AND THURSDAYS OR AS DIRECTED BY ANTICOAGULATION CLINIC   No facility-administered encounter medications on file as of 07/28/2022.    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 S/P Ant MI 1980;  b. 1997 S/P CABG x 8 (LIMA to diag-LAD, SVG to OM1-OM2-OM3, SVG to [facility] - Dr [name]);  c. 01/2015 Cath: 3VD, 8/8 patent grafts.   Cardiomyopathy    Dysrhythmia    Esophageal reflux    GERD (gastroesophageal reflux disease)    Headache    History of colonoscopy    History of transesophageal echocardiography (TEE) for monitoring    Other and unspecified hyperlipidemia    Paroxysmal atrial fibrillation ([place]) 02/20/2022   S/P angioplasty with stent 02/17/22 DES to proximal VG to PDA 02/20/2022   S/P TAVR (transcatheter aortic valve replacement)    a. 03/2015 26 mm Edwards Sapien 3 transcatheter heart valve placed via open left transfemoral approach.   Severe aortic stenosis 08/10/2012   Skin lesions, generalized    facial which [name] represent actinic keratoses and possible photosensitivity from Amiodarone   Unspecified essential hypertension    Past Surgical History:  Procedure Laterality Date   CARDIAC CATHETERIZATION     CARDIOVERSION  11/18/2006   Dr. [name]   CATARACT EXTRACTION W/ INTRAOCULAR LENS IMPLANT  April '13  (Dr. [name])   left eye only   CHOLECYSTECTOMY     CORONARY ARTERY BYPASS GRAFT  02/09/1996   LIMA to diag-LAD, SVG to OM1-OM2-OM3, SVG to [facility]   CORONARY STENT INTERVENTION N/A 02/17/2022   Procedure: CORONARY STENT INTERVENTION;  Surgeon: Burnell Blanks, MD;  Location: [facility] CV LAB;  Service: Cardiovascular;  Laterality: N/A;   CORONARY/GRAFT ANGIOGRAPHY N/A 02/17/2022   Procedure: CORONARY/GRAFT ANGIOGRAPHY;  Surgeon: Burnell Blanks, MD;  Location: [facility];  Service: Cardiovascular;  Laterality: N/A;   EYE SURGERY     LEFT AND RIGHT HEART CATHETERIZATION WITH CORONARY/GRAFT ANGIOGRAM N/A 01/26/2015   Procedure: LEFT AND RIGHT HEART CATHETERIZATION WITH [name];  Surgeon: [name], MD;  Location: [facility] CATH LAB;  Service: Cardiovascular;  Laterality: N/A;   TEE WITHOUT CARDIOVERSION N/A 03/10/2015   Procedure: TRANSESOPHAGEAL ECHOCARDIOGRAM (TEE);  Surgeon: [name], MD;  Location: [facility];  Service: Open Heart Surgery;  Laterality: N/A;   TONSILLECTOMY     TRANSCATHETER AORTIC VALVE REPLACEMENT, TRANSFEMORAL N/A 03/10/2015   Procedure: TRANSCATHETER AORTIC VALVE REPLACEMENT, TRANSFEMORAL;  Surgeon: [name], MD;  Location: [facility] OR;  Service: Open Heart Surgery;  Laterality: N/A;   Family History  Problem Relation Age of Onset   Stroke Mother 24       Deceased   Crohn's disease Mother        Deceased   Heart attack Father 59       Deceased   Heart disease Father    Heart failure Father        Deceased   Atrial fibrillation Brother    Heart disease Paternal Uncle    Prostate cancer Paternal Uncle    Colon cancer Neg Hx    Social History   Socioeconomic History   Marital status: Married    Spouse name: Not on file   Number of children: Not on file   Years of education: Not on file   Highest education level: Not on file  Occupational History   Not on file  Tobacco Use   Smoking status: Never   Smokeless tobacco: Never   Tobacco comments:    Does not smoke.  Vaping Use   Vaping Use: Never used  Substance and Sexual Activity   Alcohol use: No    Alcohol/week: 0.0 standard drinks of alcohol   Drug use: No   Sexual activity: Not Currently  Other Topics Concern    Not on file  Social History Narrative   HSG. [place] [place] 6 years. Married - '69 - 1 year/divorced. '76 - . No children. Work - mfg/textiles - Designer, multimedia; currently works doing maintenance. [employer] - they have discussed this. Provided packet august '13.                Social Determinants of Health   Financial Resource Strain: Low Risk  (07/28/2022)   Overall Financial Resource Strain (CARDIA)    Difficulty of Paying Living Expenses: Not hard at all  Food Insecurity: No Food Insecurity (07/28/2022)   Hunger Vital Sign    Worried About Running Out of Food in the Last Year: Never true    Ran Out of Food in the Last Year: Never true  Transportation Needs: No Transportation Needs (07/28/2022)   PRAPARE - Hydrologist (Medical): No    Lack of Transportation (Non-Medical): No  Physical Activity: Sufficiently Active (07/28/2022)   Exercise Vital Sign    Days of Exercise per Week: 3 days    Minutes of Exercise per Session: 60 min  Stress: No Stress Concern Present (07/28/2022)   [place]    Feeling of Stress : Not at all  Social Connections: Moderately Integrated (07/28/2022)   Social Connection and Isolation Panel [NHANES]    Frequency of Communication with Friends and Family: More than three times a week    Frequency of Social Gatherings with Friends and Family: More than three times a week    Attends Religious Services: More than 4 times per year    Active Member of [employer] or Organizations: Yes    Attends Archivist Meetings: More than 4 times per year    Marital Status: Widowed    Tobacco Counseling Counseling given: Not Answered Tobacco comments: Does not smoke.   Clinical Intake:  Pre-visit preparation completed: No  Pain : No/denies pain     BMI - recorded: 23.08 Nutritional Status: BMI of 19-24  Normal Nutritional Risks: None Diabetes: No  How often  do you need to have someone help you when you read  instructions, pamphlets, or other written materials from your doctor or pharmacy?: 1 - Never  Diabetic?  No  Interpreter Needed?: No  Information entered by :: [name] LPN   Activities of Daily Living    07/28/2022    3:04 PM 03/08/2022   12:41 PM  In your present state of health, do you have any difficulty performing the following activities:  Hearing? 1   Comment Wears hearing aids   Vision? 0   Difficulty concentrating or making decisions? 0   Walking or climbing stairs? 0   Dressing or bathing? 0   Doing errands, shopping? 0 0  Preparing Food and eating ? N   Using the Toilet? N   In the past six months, have you accidently leaked urine? N   Do you have problems with loss of bowel control? N   Managing your Medications? N   Managing your Finances? N   Housekeeping or managing your Housekeeping? N     Patient Care Team: [name], MD as PCP - General (Family Medicine) [name], MD as PCP - Cardiology (Cardiology) [name], MD as PCP - Electrophysiology (Cardiology) [name], [facility] as Pharmacist (Pharmacist)  Indicate any recent Medical Services you [name] have received from other than [facility] providers in the past year (date [name] be approximate).     Assessment:   This is a routine wellness examination for Mahesh.  Hearing/Vision screen Hearing Screening - Comments:: Wears hearing aids Vision Screening - Comments:: Wears glasses. Followed by [place] issues and exercise activities discussed: Exercise limited by: None identified   Goals Addressed               This Visit's Progress     No current goals (pt-stated)         Depression Screen    07/28/2022    3:02 PM 02/23/2022    4:53 PM 06/21/2021    1:20 PM 06/21/2021    1:14 PM 01/29/2020    2:00 PM 10/30/2017    1:37 PM 03/21/2016    8:24 PM  PHQ 2/9 Scores  PHQ - 2 Score 0 0 0 0 0 0 0  PHQ- 9 Score   0   0      Fall Risk    07/28/2022    3:05 PM 02/23/2022    4:53 PM 06/21/2021    1:19 PM 01/29/2020    2:00 PM 10/30/2017    1:37 PM  Fall Risk   Falls in the past year? 0 0 0 1 No  Number falls in past yr: 0 0 0 0   Injury with Fall? 0 0 0 1   Risk for fall due to : No Fall Risks No Fall Risks     Follow up  Falls evaluation completed Falls evaluation completed      [place]:  Any stairs in or around the home? Yes  If so, are there any without handrails? No  Home free of loose throw rugs in walkways, pet beds, electrical cords, etc? Yes  Adequate lighting in your home to reduce risk of falls? Yes   ASSISTIVE DEVICES UTILIZED TO PREVENT FALLS:  Life alert? No  Use of a cane, walker or w/c? No  Grab bars in the bathroom? Yes  Shower chair or bench in shower? Yes  Elevated toilet seat or a handicapped toilet? No   TIMED UP AND GO:  Was the test  performed? No . Audio Visit  Cognitive Function:        07/28/2022    3:06 PM  6CIT Screen  What Year? 0 points  What month? 0 points  What time? 0 points  Count back from 20 0 points  Months in reverse 0 points  Repeat phrase 0 points  Total Score 0 points    Immunizations Immunization History  Administered Date(s) Administered   Fluad Quad(high Dose 65+) 10/04/2019, 10/08/2020   Influenza Whole 09/05/2001, 09/26/2008, 09/26/2009, 09/14/2010   Influenza, High Dose Seasonal PF 08/12/2015, 09/02/2016, 08/25/2017, 09/07/2018, 08/24/2021   Influenza,inj,Quad PF,6+ Mos 08/27/2014   Influenza-Unspecified 09/12/2012, 08/16/2013   PFIZER(Purple Top)SARS-COV-2 Vaccination 01/06/2020, 01/20/2020, 11/23/2020   Pneumococcal Conjugate-13 04/07/2014   Pneumococcal Polysaccharide-23 09/05/2001, 01/14/2010   Td 01/06/1999   Tetanus 05/15/2013   Zoster Recombinat (Shingrix) 09/06/2019, 02/19/2020   Zoster, Live 12/14/2011      Flu Vaccine status: Up to date  Pneumococcal vaccine status: Up  to date  Covid-19 vaccine status: Completed vaccines  Qualifies for Shingles Vaccine? Yes   Zostavax completed Yes   Shingrix Completed?: Yes  Screening Tests Health Maintenance  Topic Date Due   INFLUENZA VACCINE  07/05/2022   COVID-19 Vaccine (4 - Pfizer risk series) 08/13/2022 (Originally 01/18/2021)   TETANUS/TDAP  08/19/2026   Pneumonia Vaccine 56+ Years old  Completed   Zoster Vaccines- Shingrix  Completed   HPV VACCINES  Aged Out    Health Maintenance  Health Maintenance Due  Topic Date Due   INFLUENZA VACCINE  07/05/2022    Colorectal cancer screening: No longer required.   Lung Cancer Screening: (Low Dose CT Chest recommended if Age 84-80 years, 30 pack-year currently smoking OR have quit w/in 15years.) does not qualify.     Additional Screening:  Hepatitis C Screening: does not qualify; Completed   Vision Screening: Recommended annual ophthalmology exams for early detection of glaucoma and other disorders of the eye. Is the patient up to date with their annual eye exam?  Yes  Who is the provider or what is the name of the office in which the patient attends annual eye exams? [facility] If pt is not established with a provider, would they like to be referred to a provider to establish care? No .   Dental Screening: Recommended annual dental exams for proper oral hygiene  Community Resource Referral / Chronic Care Management:  CRR required this visit?  No   CCM required this visit?  No      Plan:     I have personally reviewed and noted the following in the patient's chart:   Medical and social history Use of alcohol, tobacco or illicit drugs  Current medications and supplements including opioid prescriptions. Patient is not currently taking opioid prescriptions. Functional ability and status Nutritional status Physical activity Advanced directives List of other physicians Hospitalizations, surgeries, and ER visits in previous 12  months Vitals Screenings to include cognitive, depression, and falls Referrals and appointments  In addition, I have reviewed and discussed with patient certain preventive protocols, quality metrics, and best practice recommendations. A written personalized care plan for preventive services as well as general preventive health recommendations were provided to patient.     Jesus Peaches, LPN   08/05/5175   Nurse Notes: None

## 2022-07-28 NOTE — Patient Instructions (Addendum)
Jesus [name] , Thank you for taking time to come for your Medicare Wellness Visit. I appreciate your ongoing commitment to your health goals. Please review the following plan we discussed and let me know if I can assist you in the future.   These are the goals we discussed:  Goals       No current goals (pt-stated)        This is a list of the screening recommended for you and due dates:  Health Maintenance  Topic Date Due   Flu Shot  07/05/2022   COVID-19 Vaccine (4 - Pfizer risk series) 08/13/2022*   Tetanus Vaccine  08/19/2026   Pneumonia Vaccine  Completed   Zoster (Shingles) Vaccine  Completed   HPV Vaccine  Aged Out  *Topic was postponed. The date shown is not the original due date.   Advanced directives: Yes  Conditions/risks identified: None  Next appointment: Follow up in one year for your annual wellness visit.    Preventive Care 65 Years and Older, Male Preventive care refers to lifestyle choices and visits with your health care provider that can promote health and wellness. What does preventive care include? A yearly physical exam. This is also called an annual well check. Dental exams once or twice a year. Routine eye exams. Ask your health care provider how often you should have your eyes checked. Personal lifestyle choices, including: Daily care of your teeth and gums. Regular physical activity. Eating a healthy diet. Avoiding tobacco and drug use. Limiting alcohol use. Practicing safe sex. Taking low doses of aspirin every day. Taking vitamin and mineral supplements as recommended by your health care provider. What happens during an annual well check? The services and screenings done by your health care provider during your annual well check will depend on your age, overall health, lifestyle risk factors, and family history of disease. Counseling  Your health care provider may ask you questions about your: Alcohol use. Tobacco use. Drug  use. Emotional well-being. Home and relationship well-being. Sexual activity. Eating habits. History of falls. Memory and ability to understand (cognition). Work and work Statistician. Screening  You may have the following tests or measurements: Height, weight, and BMI. Blood pressure. Lipid and cholesterol levels. These may be checked every 5 years, or more frequently if you are over 73 years old. Skin check. Lung cancer screening. You may have this screening every year starting at age 107 if you have a 30-pack-year history of smoking and currently smoke or have quit within the past 15 years. Fecal occult blood test (FOBT) of the stool. You may have this test every year starting at age 29. Flexible sigmoidoscopy or colonoscopy. You may have a sigmoidoscopy every 5 years or a colonoscopy every 10 years starting at age 43. Prostate cancer screening. Recommendations will vary depending on your family history and other risks. Hepatitis C blood test. Hepatitis B blood test. Sexually transmitted disease (STD) testing. Diabetes screening. This is done by checking your blood sugar (glucose) after you have not eaten for a while (fasting). You may have this done every 1-3 years. Abdominal aortic aneurysm (AAA) screening. You may need this if you are a current or former smoker. Osteoporosis. You may be screened starting at age 68 if you are at high risk. Talk with your health care provider about your test results, treatment options, and if necessary, the need for more tests. Vaccines  Your health care provider may recommend certain vaccines, such as: Influenza vaccine. This is recommended  every year. Tetanus, diphtheria, and acellular pertussis (Tdap, Td) vaccine. You may need a Td booster every 10 years. Zoster vaccine. You may need this after age 75. Pneumococcal 13-valent conjugate (PCV13) vaccine. One dose is recommended after age 9. Pneumococcal polysaccharide (PPSV23) vaccine. One dose is  recommended after age 61. Talk to your health care provider about which screenings and vaccines you need and how often you need them. This information is not intended to replace advice given to you by your health care provider. Make sure you discuss any questions you have with your health care provider. Document Released: 12/18/2015 Document Revised: 08/10/2016 Document Reviewed: 09/22/2015 Elsevier Interactive Patient Education  2017 [place] Prevention in the Home Falls can cause injuries. They can happen to people of all ages. There are many things you can do to make your home safe and to help prevent falls. What can I do on the outside of my home? Regularly fix the edges of walkways and driveways and fix any cracks. Remove anything that might make you trip as you walk through a door, such as a raised step or threshold. Trim any bushes or trees on the path to your home. Use bright outdoor lighting. Clear any walking paths of anything that might make someone trip, such as rocks or tools. Regularly check to see if handrails are loose or broken. Make sure that both sides of any steps have handrails. Any raised decks and porches should have guardrails on the edges. Have any leaves, snow, or ice cleared regularly. Use sand or salt on walking paths during winter. Clean up any spills in your garage right away. This includes oil or grease spills. What can I do in the bathroom? Use night lights. Install grab bars by the toilet and in the tub and shower. Do not use towel bars as grab bars. Use non-skid mats or decals in the tub or shower. If you need to sit down in the shower, use a plastic, non-slip stool. Keep the floor dry. Clean up any water that spills on the floor as soon as it happens. Remove soap buildup in the tub or shower regularly. Attach bath mats securely with double-sided non-slip rug tape. Do not have throw rugs and other things on the floor that can make you  trip. What can I do in the bedroom? Use night lights. Make sure that you have a light by your bed that is easy to reach. Do not use any sheets or blankets that are too big for your bed. They should not hang down onto the floor. Have a firm chair that has side arms. You can use this for support while you get dressed. Do not have throw rugs and other things on the floor that can make you trip. What can I do in the kitchen? Clean up any spills right away. Avoid walking on wet floors. Keep items that you use a lot in easy-to-reach places. If you need to reach something above you, use a strong step stool that has a grab bar. Keep electrical cords out of the way. Do not use floor polish or wax that makes floors slippery. If you must use wax, use non-skid floor wax. Do not have throw rugs and other things on the floor that can make you trip. What can I do with my stairs? Do not leave any items on the stairs. Make sure that there are handrails on both sides of the stairs and use them. Fix handrails that are broken  or loose. Make sure that handrails are as long as the stairways. Check any carpeting to make sure that it is firmly attached to the stairs. Fix any carpet that is loose or worn. Avoid having throw rugs at the top or bottom of the stairs. If you do have throw rugs, attach them to the floor with carpet tape. Make sure that you have a light switch at the top of the stairs and the bottom of the stairs. If you do not have them, ask someone to add them for you. What else can I do to help prevent falls? Wear shoes that: Do not have high heels. Have rubber bottoms. Are comfortable and fit you well. Are closed at the toe. Do not wear sandals. If you use a stepladder: Make sure that it is fully opened. Do not climb a closed stepladder. Make sure that both sides of the stepladder are locked into place. Ask someone to hold it for you, if possible. Clearly mark and make sure that you can  see: Any grab bars or handrails. First and last steps. Where the edge of each step is. Use tools that help you move around (mobility aids) if they are needed. These include: Canes. Walkers. Scooters. Crutches. Turn on the lights when you go into a dark area. Replace any light bulbs as soon as they burn out. Set up your furniture so you have a clear path. Avoid moving your furniture around. If any of your floors are uneven, fix them. If there are any pets around you, be aware of where they are. Review your medicines with your doctor. Some medicines can make you feel dizzy. This can increase your chance of falling. Ask your doctor what other things that you can do to help prevent falls. This information is not intended to replace advice given to you by your health care provider. Make sure you discuss any questions you have with your health care provider. Document Released: 09/17/2009 Document Revised: 04/28/2016 Document Reviewed: 12/26/2014 Elsevier Interactive Patient Education  2017 [employer].

## 2022-07-30 DIAGNOSIS — L299 Pruritus, unspecified: Secondary | ICD-10-CM | POA: Diagnosis not present

## 2022-07-30 DIAGNOSIS — S20229A Contusion of unspecified back wall of thorax, initial encounter: Secondary | ICD-10-CM | POA: Diagnosis not present

## 2022-08-01 ENCOUNTER — Telehealth: Payer: Medicare Other

## 2022-08-03 DIAGNOSIS — B078 Other viral warts: Secondary | ICD-10-CM | POA: Diagnosis not present

## 2022-08-03 DIAGNOSIS — Z85828 Personal history of other malignant neoplasm of skin: Secondary | ICD-10-CM | POA: Diagnosis not present

## 2022-08-03 DIAGNOSIS — D692 Other nonthrombocytopenic purpura: Secondary | ICD-10-CM | POA: Diagnosis not present

## 2022-08-03 DIAGNOSIS — L821 Other seborrheic keratosis: Secondary | ICD-10-CM | POA: Diagnosis not present

## 2022-08-03 DIAGNOSIS — D1801 Hemangioma of skin and subcutaneous tissue: Secondary | ICD-10-CM | POA: Diagnosis not present

## 2022-08-03 DIAGNOSIS — L570 Actinic keratosis: Secondary | ICD-10-CM | POA: Diagnosis not present

## 2022-08-04 ENCOUNTER — Other Ambulatory Visit: Payer: Self-pay | Admitting: Family Medicine

## 2022-08-04 DIAGNOSIS — M8580 Other specified disorders of bone density and structure, unspecified site: Secondary | ICD-10-CM | POA: Diagnosis not present

## 2022-08-04 DIAGNOSIS — I110 Hypertensive heart disease with heart failure: Secondary | ICD-10-CM

## 2022-08-04 DIAGNOSIS — I509 Heart failure, unspecified: Secondary | ICD-10-CM

## 2022-08-04 DIAGNOSIS — I2510 Atherosclerotic heart disease of native coronary artery without angina pectoris: Secondary | ICD-10-CM | POA: Diagnosis not present

## 2022-08-04 DIAGNOSIS — I4891 Unspecified atrial fibrillation: Secondary | ICD-10-CM

## 2022-08-04 DIAGNOSIS — E785 Hyperlipidemia, unspecified: Secondary | ICD-10-CM

## 2022-08-08 DIAGNOSIS — K591 Functional diarrhea: Secondary | ICD-10-CM | POA: Diagnosis not present

## 2022-08-13 ENCOUNTER — Other Ambulatory Visit: Payer: Self-pay | Admitting: Family Medicine

## 2022-08-15 ENCOUNTER — Telehealth: Payer: Self-pay | Admitting: Family Medicine

## 2022-08-15 NOTE — Telephone Encounter (Signed)
Pt calling to find out if he needs to continue his  empagliflozin (JARDIANCE) 10 MG TABS tablet

## 2022-08-16 NOTE — Telephone Encounter (Signed)
Left message for the pt to return my call. 

## 2022-08-17 ENCOUNTER — Telehealth: Payer: Self-pay | Admitting: Cardiovascular Disease

## 2022-08-17 ENCOUNTER — Ambulatory Visit (INDEPENDENT_AMBULATORY_CARE_PROVIDER_SITE_OTHER): Payer: Medicare Other | Admitting: Family Medicine

## 2022-08-17 ENCOUNTER — Encounter: Payer: Self-pay | Admitting: Family Medicine

## 2022-08-17 VITALS — BP 122/60 | HR 57 | Temp 98.6°F | Ht 61.0 in | Wt 120.8 lb

## 2022-08-17 DIAGNOSIS — S76011A Strain of muscle, fascia and tendon of right hip, initial encounter: Secondary | ICD-10-CM | POA: Diagnosis not present

## 2022-08-17 NOTE — Telephone Encounter (Signed)
Dentist office would like a callback regarding pt that is currently in their office in the chair. Please advise

## 2022-08-17 NOTE — Telephone Encounter (Signed)
Pt calling back for an update, pt states his appt is today 3pm

## 2022-08-17 NOTE — Progress Notes (Signed)
Established Patient Office Visit  Subjective   Patient ID: MAYO [name], male    DOB: 1941/09/03  Age: 81 y.o. MRN: [id number]  Chief Complaint  Patient presents with   Hip Pain    Patient complains of hip pain, x4 days Patient denies any known injury    Back Pain    Patient complains of back pain, x4 days, Patient denies any known injury     HPI   Jesus [name] is seen with pain mostly right gluteus region past 3 to 4 days.  Denies any specific injury.  Goes to the gym fairly regularly.  Pain is actually somewhat better today.  No difficulty with walking.  Denies any fall.  Occasional low back pain but most of this pain is right gluteus.  No radiculitis symptoms.  Denies any lower extremity numbness or weakness.  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 S/P Ant MI 1980;  b. 1997 S/P CABG x 8 (LIMA to diag-LAD, SVG to OM1-OM2-OM3, SVG to [facility] - Dr [name]);  c. 01/2015 Cath: 3VD, 8/8 patent grafts.   Cardiomyopathy    Dysrhythmia    Esophageal reflux    GERD (gastroesophageal reflux disease)    Headache    History of colonoscopy    History of transesophageal echocardiography (TEE) for monitoring    Other and unspecified hyperlipidemia    Paroxysmal atrial fibrillation ([place]) 02/20/2022   S/P angioplasty with stent 02/17/22 DES to proximal VG to PDA 02/20/2022   S/P TAVR (transcatheter aortic valve replacement)    a. 03/2015 26 mm Edwards Sapien 3 transcatheter heart valve placed via open left transfemoral approach.   Severe aortic stenosis 08/10/2012   Skin lesions, generalized    facial which Jesus represent actinic keratoses and possible photosensitivity from Amiodarone   Unspecified essential hypertension    Past Surgical History:  Procedure  Laterality Date   CARDIAC CATHETERIZATION     CARDIOVERSION  11/18/2006   Dr. [name]   CATARACT EXTRACTION W/ INTRAOCULAR LENS IMPLANT  April '13  (Dr. [name])   left eye only   CHOLECYSTECTOMY     CORONARY ARTERY BYPASS GRAFT  02/09/1996   LIMA to diag-LAD, SVG to OM1-OM2-OM3, SVG to [facility]   CORONARY STENT INTERVENTION N/A 02/17/2022   Procedure: CORONARY STENT INTERVENTION;  Surgeon: Burnell Blanks, MD;  Location: [place] CV [facility];  Service: Cardiovascular;  Laterality: N/A;   CORONARY/GRAFT ANGIOGRAPHY N/A 02/17/2022   Procedure: CORONARY/GRAFT ANGIOGRAPHY;  Surgeon: Burnell Blanks, MD;  Location: [place] [facility];  Service: Cardiovascular;  Laterality: N/A;   EYE SURGERY     LEFT AND RIGHT HEART CATHETERIZATION WITH CORONARY/GRAFT ANGIOGRAM N/A 01/26/2015   Procedure: LEFT AND RIGHT HEART CATHETERIZATION WITH Jesus [name];  Surgeon: [name], MD;  Location[facility];  Service: Cardiovascular;  Laterality: N/A;   TEE WITHOUT CARDIOVERSION N/A 03/10/2015   Procedure: TRANSESOPHAGEAL ECHOCARDIOGRAM (TEE);  Surgeon: [name], MD;  Location: [place];  Service: Open Heart Surgery;  Laterality: N/A;   TONSILLECTOMY     TRANSCATHETER AORTIC VALVE REPLACEMENT, TRANSFEMORAL N/A 03/10/2015   Procedure: TRANSCATHETER AORTIC VALVE REPLACEMENT, TRANSFEMORAL;  Surgeon: [name], MD;  Location: [place];  Service: Open Heart Surgery;  Laterality: N/A;    reports that he has never smoked. He has never used smokeless tobacco. He reports that he does not drink alcohol and does not use drugs. family history includes Atrial fibrillation in his brother; Crohn's disease in his mother; Heart attack (age of onset: 29) in his father; Heart disease in his father and paternal uncle; Heart failure in his father; Prostate cancer in his paternal uncle; Stroke (age of onset: 32) in his mother. Allergies  Allergen Reactions   Amlodipine Other (See Comments)     Swelling?   Codeine Other (See Comments)    Pt does not remember   Iodine Swelling    Review of Systems  Constitutional:  Negative for chills and fever.  Cardiovascular:  Negative for chest pain.  Genitourinary:  Negative for dysuria.  Musculoskeletal:  Positive for back pain.  Neurological:  Negative for tingling and focal weakness.      Objective:     BP 122/60 (BP Location: Left Arm, Patient Position: Sitting, Cuff Size: Normal)   Pulse (!) 57   Temp 98.6 F (37 C) (Oral)   Ht '5\' 1"'$  (1.549 m)   Wt 120 lb 12.8 oz (54.8 kg)   SpO2 98%   BMI 22.82 kg/m    Physical Exam Vitals reviewed.  Cardiovascular:     Rate and Rhythm: Normal rate and regular rhythm.  Pulmonary:     Effort: Pulmonary effort is normal.     Breath sounds: Normal breath sounds.  Musculoskeletal:     Comments: Excellent range of motion right hip.  No pain with internal or external rotation.  No bursa tenderness.  No localized point tenderness.  No lumbar tenderness.  Straight leg raise is negative.  Neurological:     Mental Status: He is alert.     Comments: Full strength lower extremity with plantarflexion, dorsiflexion, knee extension, and hip flexion.  2+ knee reflexes bilaterally.  1+ ankle bilaterally.      No results found for any visits on 08/17/22.    The ASCVD Risk score (Arnett DK, et al., 2019) failed to calculate for the following reasons:   The 2019 ASCVD risk score is only valid for ages 66 to 29   The patient has a prior MI or stroke diagnosis    Assessment & Plan:   Suspected right gluteal strain.  Doubt hip related pathology.  Symptoms improved today.  Continue walking and exercise as tolerated.  Avoid nonsteroidals with his cardiac history and Coumadin.  Jesus take Tylenol as needed.  Be in touch for any recurrent pain.  No follow-ups on file.    [name], MD

## 2022-08-17 NOTE — Telephone Encounter (Signed)
Patient informed of the message and expressed understanding  

## 2022-08-17 NOTE — Telephone Encounter (Signed)
   Name: Jesus [name]  DOB: 05-16-1941  MRN: [id number]  Primary Cardiologist: [name], MD  Chart reviewed as part of pre-operative protocol coverage.  Received preop clearance request today (08/17/2022) for crown lengthening gum surgery scheduled for 08/17/2022 with request to hold Plavix prior to procedure (patient also takes warfarin). Patient is less than  months post PCI, therefore, we do not recommend holding Plavix at this time. I discussed this with Dr. [name], [place], who verbalized understanding and states he will cancel today's procedure. The patient has an upcoming visit scheduled with [name], PA on 09/13/2022 at which time clearance can be addressed in case there are any issues that would impact surgical recommendations.   This message will also be routed to pharmacy pool and/or Dr. [name] for input on holding Plavix and Coumadin as requested below so that this information is available to the clearing provider at time of patient's appointment.   I will remove this message from the preop box as separate preop APP input not needed at this time.   Please call with any questions.  Lenna Sciara, NP  08/17/2022, 12:46 PM

## 2022-08-17 NOTE — Telephone Encounter (Signed)
   Pre-operative Risk Assessment    Patient Name: Jesus [name]  DOB: 1941-11-30 MRN: [id number]  DR. NAZZIOLA IS NEEDING TO KNOW WHEN IT WILL BE SAFE TO PROCEED WITH DENTAL PROCEDURE  DUE TO PT RECENT HEART ATTACK   Request for Surgical Clearance    Procedure:   CROWN LENGTHING  GUM SURGERY LOWER RIGHT JAW  Date of Surgery:  Clearance TBD                                 Surgeon:  DR. Malachy Chamber, DDS Surgeon's Group or Practice Name:  [facility], D[facility] Phone number:  [phone] Fax number:  [phone] ATTN: [id number]   Type of Clearance Requested:   - Medical  - Pharmacy:  Hold Clopidogrel (Plavix)     Type of Anesthesia:  Local    Additional requests/questions:    Jiles Prows   08/17/2022, 11:07 AM

## 2022-08-18 ENCOUNTER — Other Ambulatory Visit: Payer: Self-pay | Admitting: Family Medicine

## 2022-08-19 ENCOUNTER — Ambulatory Visit (INDEPENDENT_AMBULATORY_CARE_PROVIDER_SITE_OTHER): Payer: Medicare Other

## 2022-08-19 DIAGNOSIS — Z7901 Long term (current) use of anticoagulants: Secondary | ICD-10-CM

## 2022-08-19 LAB — POCT INR: INR: 2.5 (ref 2.0–3.0)

## 2022-08-19 NOTE — Progress Notes (Signed)
Continue to take 1 tablet daily except take 1/2 tablet on Mondays, Wednesday, and Friday. Recheck in 6 weeks at [facility].

## 2022-08-19 NOTE — Patient Instructions (Addendum)
Pre visit review using our clinic review tool, if applicable. No additional management support is needed unless otherwise documented below in the visit note.   Continue to take 1 tablet daily except take 1/2 tablet on Mondays, Wednesday, and Friday. Recheck in 6 weeks at [facility].

## 2022-08-23 ENCOUNTER — Other Ambulatory Visit: Payer: Self-pay | Admitting: Physical Medicine and Rehabilitation

## 2022-09-01 ENCOUNTER — Other Ambulatory Visit (INDEPENDENT_AMBULATORY_CARE_PROVIDER_SITE_OTHER): Payer: Medicare Other

## 2022-09-01 DIAGNOSIS — E785 Hyperlipidemia, unspecified: Secondary | ICD-10-CM

## 2022-09-01 LAB — LIPID PANEL
Cholesterol: 131 mg/dL (ref 0–200)
HDL: 50 mg/dL (ref >39.00)
LDL Cholesterol: 65 mg/dL (ref 0–99)
NonHDL: 81.23
Total CHOL/HDL Ratio: 3
Triglycerides: 83 mg/dL (ref 0.0–149.0)
VLDL: 16.6 mg/dL (ref 0.0–40.0)

## 2022-09-02 ENCOUNTER — Encounter: Payer: Self-pay | Admitting: Family Medicine

## 2022-09-02 ENCOUNTER — Ambulatory Visit (INDEPENDENT_AMBULATORY_CARE_PROVIDER_SITE_OTHER): Payer: Medicare Other | Admitting: Family Medicine

## 2022-09-02 VITALS — BP 128/60 | HR 56 | Temp 98.1°F | Wt 119.1 lb

## 2022-09-02 DIAGNOSIS — S20212A Contusion of left front wall of thorax, initial encounter: Secondary | ICD-10-CM | POA: Diagnosis not present

## 2022-09-02 NOTE — Progress Notes (Signed)
   Subjective:    Patient ID: Jesus [name], male    DOB: [birth year], 81 y.o.   MRN: [id number]  HPI Here for an injury that occurred at home 5 days ago. While he was trying to pull a tree branch out of a garbage can, the branch suddenly came loose and jabbed him in the left lower rib cage. He has had some pain there since then, but it is improving every day. No SOB.    Review of Systems  Constitutional: Negative.   HENT: Negative.    Eyes: Negative.   Respiratory: Negative.    Cardiovascular:  Positive for chest pain. Negative for palpitations and leg swelling.       Objective:   Physical Exam Constitutional:      General: He is not in acute distress.    Appearance: Normal appearance.  Cardiovascular:     Rate and Rhythm: Normal rate and regular rhythm.     Pulses: Normal pulses.     Heart sounds: Normal heart sounds.  Pulmonary:     Effort: Pulmonary effort is normal. No respiratory distress.     Breath sounds: Normal breath sounds. No stridor. No wheezing, rhonchi or rales.     Comments: He is mildly tender over the left anterior lower ribs. There is slight ecchymosis here. No crepitus.  Neurological:     Mental Status: He is alert.           Assessment & Plan:  Rib contusion. This is healing as expected. No treatment needed.  [name], MD

## 2022-09-05 ENCOUNTER — Telehealth: Payer: Self-pay | Admitting: Family Medicine

## 2022-09-05 NOTE — Telephone Encounter (Signed)
Pt returning call for results

## 2022-09-05 NOTE — Telephone Encounter (Signed)
See result note.  

## 2022-09-07 ENCOUNTER — Ambulatory Visit (INDEPENDENT_AMBULATORY_CARE_PROVIDER_SITE_OTHER): Payer: Medicare Other | Admitting: Family Medicine

## 2022-09-07 ENCOUNTER — Encounter: Payer: Self-pay | Admitting: Family Medicine

## 2022-09-07 VITALS — BP 124/50 | HR 65 | Temp 98.1°F | Ht 61.0 in | Wt 121.2 lb

## 2022-09-07 DIAGNOSIS — I10 Essential (primary) hypertension: Secondary | ICD-10-CM | POA: Diagnosis not present

## 2022-09-07 NOTE — Progress Notes (Unsigned)
Established Patient Office Visit  Subjective   Patient ID: Jesus [name], male    DOB: December 10, 1940  Age: 81 y.o. MRN: [id number]  Chief Complaint  Patient presents with   Blood Pressure Check    HPI  {History (Optional):23778} Reedy has history of aortic stenosis, CAD, atrial fibrillation, chronic Coumadin therapy, hypertension, systolic heart failure, history of TAVR, history of CABG 1997.  He had called earlier in the week with concern that he had some elevated blood pressure readings.  He apparently had 1 reading of systolic 341.  Denies any headaches.  No chest pains.  No dizziness.  No increased peripheral edema.  His home weights have consistently been 115 to 116 pounds.  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 S/P Ant MI 1980;  b. 1997 S/P CABG x 8 (LIMA to diag-LAD, SVG to OM1-OM2-OM3, SVG to [facility] - Dr [name]);  c. 01/2015 Cath: 3VD, 8/8 patent grafts.   Cardiomyopathy    Dysrhythmia    Esophageal reflux    GERD (gastroesophageal reflux disease)    Headache    History of colonoscopy    History of transesophageal echocardiography (TEE) for monitoring    Other and unspecified hyperlipidemia    Paroxysmal atrial fibrillation ([place]) 02/20/2022   S/P angioplasty with stent 02/17/22 DES to proximal VG to PDA 02/20/2022   S/P TAVR (transcatheter aortic valve replacement)    a. 03/2015 26 mm Edwards Sapien 3 transcatheter heart valve placed via open left transfemoral approach.   Severe aortic stenosis 08/10/2012   Skin lesions, generalized    facial which may represent actinic keratoses and possible photosensitivity from Amiodarone    Unspecified essential hypertension    Past Surgical History:  Procedure Laterality Date   CARDIAC CATHETERIZATION     CARDIOVERSION  11/18/2006   Dr. [name]   CATARACT EXTRACTION W/ INTRAOCULAR LENS IMPLANT  April '13  (Dr. [name])   left eye only   CHOLECYSTECTOMY     CORONARY ARTERY BYPASS GRAFT  02/09/1996   LIMA to diag-LAD, SVG to OM1-OM2-OM3, SVG to [facility]   CORONARY STENT INTERVENTION N/A 02/17/2022   Procedure: CORONARY STENT INTERVENTION;  Surgeon: Burnell Blanks, MD;  Location: [place] [facility];  Service: Cardiovascular;  Laterality: N/A;   CORONARY/GRAFT ANGIOGRAPHY N/A 02/17/2022   Procedure: CORONARY/GRAFT ANGIOGRAPHY;  Surgeon: Burnell Blanks, MD;  Location: [place] [facility];  Service: Cardiovascular;  Laterality: N/A;   EYE SURGERY     LEFT AND RIGHT HEART CATHETERIZATION WITH CORONARY/GRAFT ANGIOGRAM N/A 01/26/2015   Procedure: LEFT AND RIGHT HEART CATHETERIZATION WITH [name];  Surgeon: [name], MD;  Location: [facility];  Service: Cardiovascular;  Laterality: N/A;   TEE WITHOUT CARDIOVERSION N/A 03/10/2015   Procedure: TRANSESOPHAGEAL ECHOCARDIOGRAM (TEE);  Surgeon: [name], MD;  Location: [place];  Service: Open Heart Surgery;  Laterality: N/A;   TONSILLECTOMY     TRANSCATHETER AORTIC VALVE REPLACEMENT, TRANSFEMORAL N/A 03/10/2015   Procedure: TRANSCATHETER AORTIC VALVE REPLACEMENT, TRANSFEMORAL;  Surgeon: [name], MD;  Location: [place];  Service: Open Heart Surgery;  Laterality: N/A;    reports that  he has never smoked. He has never used smokeless tobacco. He reports that he does not drink alcohol and does not use drugs. family history includes Atrial fibrillation in his brother; Crohn's disease in his mother; Heart attack (age of onset: 49) in his father; Heart disease in his father and paternal uncle; Heart failure in his father; Prostate cancer in his paternal uncle; Stroke (age of onset: 30) in his  mother. Allergies  Allergen Reactions   Amlodipine Other (See Comments)    Swelling?   Codeine Other (See Comments)    Pt does not remember   Iodine Swelling    Review of Systems  Constitutional:  Negative for malaise/fatigue.  Eyes:  Negative for blurred vision.  Respiratory:  Negative for shortness of breath.   Cardiovascular:  Negative for chest pain.  Gastrointestinal:  Negative for abdominal pain.  Neurological:  Negative for dizziness, weakness and headaches.      Objective:     BP (!) 124/50 (BP Location: Left Arm, Cuff Size: Normal)   Pulse 65   Temp 98.1 F (36.7 C) (Oral)   Ht '5\' 1"'$  (1.549 m)   Wt 121 lb 3.2 oz (55 kg)   SpO2 97%   BMI 22.90 kg/m  BP Readings from Last 3 Encounters:  09/07/22 (!) 124/50  09/02/22 128/60  08/17/22 122/60   Wt Readings from Last 3 Encounters:  09/07/22 121 lb 3.2 oz (55 kg)  09/02/22 119 lb 2 oz (54 kg)  08/17/22 120 lb 12.8 oz (54.8 kg)      Physical Exam Vitals reviewed.  Constitutional:      Appearance: Normal appearance.  Cardiovascular:     Rate and Rhythm: Normal rate.     Heart sounds: Murmur heard.  Pulmonary:     Effort: Pulmonary effort is normal.     Breath sounds: No wheezing.     Comments: Some faint crackles in both bases Musculoskeletal:     Right lower leg: No edema.     Left lower leg: No edema.  Neurological:     Mental Status: He is alert.      No results found for any visits on 09/07/22.  {Labs (Optional):23779}  The ASCVD Risk score (Arnett DK, et al., 2019) failed to calculate for the following reasons:   The 2019 ASCVD risk score is only valid for ages 9 to 50   The patient has a prior MI or stroke diagnosis    Assessment & Plan:   Hypertension.  Patient is concerned regarding a few isolated elevated readings.  Repeat today after rest left arm seated 120/50 and standing 120/50.  -We recommend no change in his current blood pressure medication regimen. -Continue to  monitor.  Patient was unable to confirm flu vaccine.  He thinks he may have gotten this in [facility].  He will confirm whether he got this at urgent care recently and if not he is to get flu vaccine over the next month  No follow-ups on file.    [name], MD

## 2022-09-07 NOTE — Patient Instructions (Signed)
Confirm whether you have gotten flu vaccine this Fall and need to get if have not  BP stable here today.  Continue with current medications.

## 2022-09-12 NOTE — Progress Notes (Addendum)
Cardiology Office Note:    Date:  09/13/2022   ID:  Jesus [name], DOB [birth year], MRN [id number]  PCP:  [name], MD   [facility] Cardiologist:  [name], MD Electrophysiologist:  [name], MD     Referring MD: [name], MD   CC: Here for regular follow-up and preoperative cardiovascular risk assessment  History of Present Illness:    Jesus [name] is a 81 y.o. male with a hx of    CAD, s/p CABG, hx of NSTEMI with DES to SVG to PDA Persistent atrial fibrillation, on warfarin Severe aortic stenosis, s/p TAVR  Chronic systolic heart failure, HFrEF (35-40% EF) Hypertension Hyperlipidemia History of A-fib, V. tach   Remote history of anterior MI in 1980, underwent multivessel CABG in 97.  LVEF was around 40 to 45% with inferior wall scar.  Most recently, in March 2023 he had an NSTEMI and found to have severe stenosis in saphenous vein graft to PDA, treated with DES.  Experienced nonsustained VT and treated with IV amiodarone and beta-blocker.  Follows Dr. Caryl Comes for this.  EF 35 to 40% in March 7096, grade 2 diastolic dysfunction, RWMA with inferior/inferolateral akinesis. Readmitted in April 2023 with acute on chronic CHF and medications were adjusted.   Last seen by Dr. [name] on May 09, 2022.  Stated he was feeling better.  Did have some leg swelling but this had improved from before.  Overall denied any cardiac complaints or issues.  Today he presents for 3-monthfollow-up appointment.  His oral surgeon Dr. GMalachy Chamberhas requested surgical clearance for crown lengthening, gum surgery of lower right jaw.  Date of surgery is TBD.  Type of clearance requested: Hold Plavix and Coumadin.  Jesus Browner NP stated preop message will be routed to pharmacy pool and/or Dr. CBurt Knackfor input about holding Plavix and Coumadin as requested below, however it does not appear that a response has been documented.  Pt is here with his  sister-in-law.  Overall he is doing well from a cardiac perspective.  Says he has not had any changes in his health since his last follow-up visit with Dr. CBurt Knack  Denies any chest pain, shortness of breath, palpitations or tachycardia, dizziness, lightheadedness, syncope, presyncope, swelling or significant weight changes, orthopnea, PND, bleeding, or claudication.  States date of surgery is TBD.  He is doing very well cardiac wise.  He is very active and goes to the gym 5 days/week and walks on the treadmill, does the bicycle machine, and performs bicep curls.  Continues to stay active at work by sweeping and cleaning.  Denies any falls or injuries.  Does state blood pressure remains elevated at home.  Lowest SBP 136 with average reading of 150s to 160s.  Denies any other questions or concerns today.   Past Medical History:  Diagnosis Date   Acute myocardial infarction of inferior wall (HBetween 1980   Aortic stenosis    mild with a mean aortic valve gradient of 12 mmHg   Atrial fibrillation (HCC)    holding sinus rhythm on Amiodarone   CAD (coronary artery disease)    a. S/P Ant MI 1980;  b. 1997 S/P CABG x 8 (LIMA to diag-LAD, SVG to OM1-OM2-OM3, [facility];  c. 01/2015 Cath: 3VD, 8/8 patent grafts.   Cardiomyopathy    Dysrhythmia    Esophageal reflux    GERD (gastroesophageal reflux disease)    Headache  History of colonoscopy    History of transesophageal echocardiography (TEE) for monitoring    Other and unspecified hyperlipidemia    Paroxysmal atrial fibrillation ([place]) 02/20/2022   S/P angioplasty with stent 02/17/22 DES to proximal VG to PDA 02/20/2022   S/P TAVR (transcatheter aortic valve replacement)    a. 03/2015 26 mm Edwards Sapien 3 transcatheter heart valve placed via open left transfemoral approach.   Severe aortic stenosis 08/10/2012   Skin lesions, generalized    facial which may represent actinic keratoses and possible photosensitivity from Amiodarone    Unspecified essential hypertension     Past Surgical History:  Procedure Laterality Date   CARDIAC CATHETERIZATION     CARDIOVERSION  11/18/2006   Dr. [name]   CATARACT EXTRACTION W/ INTRAOCULAR LENS IMPLANT  April '13  (Dr. [name])   left eye only   CHOLECYSTECTOMY     CORONARY ARTERY BYPASS GRAFT  02/09/1996   LIMA to diag-LAD, SVG to OM1-OM2-OM3, SVG to [facility]   CORONARY STENT INTERVENTION N/A 02/17/2022   Procedure: CORONARY STENT INTERVENTION;  Surgeon: Burnell Blanks, MD;  Location: [facility];  Service: Cardiovascular;  Laterality: N/A;   CORONARY/GRAFT ANGIOGRAPHY N/A 02/17/2022   Procedure: CORONARY/GRAFT ANGIOGRAPHY;  Surgeon: Burnell Blanks, MD;  Location: [facility];  Service: Cardiovascular;  Laterality: N/A;   EYE SURGERY     LEFT AND RIGHT HEART CATHETERIZATION WITH CORONARY/GRAFT ANGIOGRAM N/A 01/26/2015   Procedure: LEFT AND RIGHT HEART CATHETERIZATION WITH [name];  Surgeon: [name], MD;  Location: [facility] CATH LAB;  Service: Cardiovascular;  Laterality: N/A;   TEE WITHOUT CARDIOVERSION N/A 03/10/2015   Procedure: TRANSESOPHAGEAL ECHOCARDIOGRAM (TEE);  Surgeon: [name], MD;  Location: [place] [facility];  Service: Open Heart Surgery;  Laterality: N/A;   TONSILLECTOMY     TRANSCATHETER AORTIC VALVE REPLACEMENT, TRANSFEMORAL N/A 03/10/2015   Procedure: TRANSCATHETER AORTIC VALVE REPLACEMENT, TRANSFEMORAL;  Surgeon: [name], MD;  Location: [facility];  Service: Open Heart Surgery;  Laterality: N/A;    Current Medications: Current Meds  Medication Sig   acetaminophen (TYLENOL) 325 MG tablet Take 1-2 tablets (325-650 mg total) by mouth every 4 (four) hours as needed for mild pain.   albuterol (VENTOLIN HFA) 108 (90 Base) MCG/ACT inhaler INHALE 2 PUFFS BY MOUTH EVERY 4 HOURS ASNEEDED FOR WHEEZING OR SHORTNESS OF BREATH.   amiodarone (PACERONE) 200 MG tablet Take 1 tablet (200 mg total) by mouth daily.   amoxicillin  (AMOXIL) 500 MG capsule Take 4 tablets 1 hour prior to dental procedure   Ascorbic Acid (VITAMIN C) 1000 MG tablet Take 1,000 mg by mouth daily.   atorvastatin (LIPITOR) 80 MG tablet Take 1 tablet (80 mg total) by mouth daily.   clopidogrel (PLAVIX) 75 MG tablet Take 1 tablet (75 mg total) by mouth daily with breakfast.   diclofenac Sodium (VOLTAREN) 1 % GEL Apply 4 g topically 4 (four) times daily.   ezetimibe (ZETIA) 10 MG tablet Take 1 tablet (10 mg total) by mouth daily.   furosemide (LASIX) 20 MG tablet Take 1 tablet (20 mg total) by mouth daily as needed. For swelling in the legs, shortness of breath or if your weight goes up by 2 lbs overnight. Call cardiology for input.   JARDIANCE 10 MG TABS tablet TAKE 1 TABLET BY MOUTH EVERY DAY   loratadine (CLARITIN) 10 MG tablet Take 10 mg by mouth daily as needed for allergies.   melatonin 5 MG TABS Take 1 tablet (5 mg total)  by mouth at bedtime as needed (sleep).   Multiple Vitamins-Minerals (MULTIVITAMIN,TX-MINERALS) tablet Take 1 tablet by mouth daily.   nitroGLYCERIN (NITROSTAT) 0.4 MG SL tablet Place 1 tablet (0.4 mg total) under the tongue every 5 (five) minutes x 3 doses as needed for chest pain.   valsartan (DIOVAN) 40 MG tablet Take 1 tablet (40 mg total) by mouth daily.   warfarin (COUMADIN) 4 MG tablet TAKE 1 TABLET BY MOUTH DAILY EXCEPT TAKE 1/2 TABLET ON MONDAYS AND THURSDAYS OR AS DIRECTED BY ANTICOAGULATION CLINIC     Allergies:   Amlodipine, Codeine, and Iodine   Social History   Socioeconomic History   Marital status: Married    Spouse name: Not on file   Number of children: Not on file   Years of education: Not on file   Highest education level: Not on file  Occupational History   Not on file  Tobacco Use   Smoking status: Never   Smokeless tobacco: Never   Tobacco comments:    Does not smoke.  Vaping Use   Vaping Use: Never used  Substance and Sexual Activity   Alcohol use: No    Alcohol/week: 0.0 standard drinks  of alcohol   Drug use: No   Sexual activity: Not Currently  Other Topics Concern   Not on file  Social History Narrative   HSG. [place] 6 years. Married - '69 - 1 year/divorced. '76 - . No children. Work - mfg/textiles - Designer, multimedia; currently works doing maintenance. [employer] - they have discussed this. Provided packet august '13.                Social Determinants of Health   Financial Resource Strain: Low Risk  (07/28/2022)   Overall Financial Resource Strain (CARDIA)    Difficulty of Paying Living Expenses: Not hard at all  Food Insecurity: No Food Insecurity (07/28/2022)   Hunger Vital Sign    Worried About Running Out of Food in the Last Year: Never true    Ran Out of Food in the Last Year: Never true  Transportation Needs: No Transportation Needs (07/28/2022)   PRAPARE - Hydrologist (Medical): No    Lack of Transportation (Non-Medical): No  Physical Activity: Sufficiently Active (07/28/2022)   Exercise Vital Sign    Days of Exercise per Week: 3 days    Minutes of Exercise per Session: 60 min  Stress: No Stress Concern Present (07/28/2022)   [place]    Feeling of Stress : Not at all  Social Connections: Moderately Integrated (07/28/2022)   Social Connection and Isolation Panel [NHANES]    Frequency of Communication with Friends and Family: More than three times a week    Frequency of Social Gatherings with Friends and Family: More than three times a week    Attends Religious Services: More than 4 times per year    Active Member of [employer] or Organizations: Yes    Attends Archivist Meetings: More than 4 times per year    Marital Status: Widowed     Family History: The patient's family history includes Atrial fibrillation in his brother; Crohn's disease in his mother; Heart attack (age of onset: 17) in his father; Heart disease in his father and paternal  uncle; Heart failure in his father; Prostate cancer in his paternal uncle; Stroke (age of onset: 40) in his mother. There is no history of Colon cancer.  ROS:   Review of Systems  Constitutional: Negative.   HENT: Negative.    Eyes: Negative.   Respiratory: Negative.    Cardiovascular: Negative.   Gastrointestinal: Negative.   Genitourinary: Negative.   Musculoskeletal: Negative.   Skin: Negative.   Neurological: Negative.   Endo/Heme/Allergies:  Negative for environmental allergies and polydipsia. Bruises/bleeds easily.  Psychiatric/Behavioral: Negative.      Please see the history of present illness.    All other systems reviewed and are negative.  EKGs/Labs/Other Studies Reviewed:    The following studies were reviewed today:   EKG:  EKG is ordered today.  The ekg ordered today demonstrates SB, 58 bpm, LVH with QRS widening, otherwise no acute changes.    Recent Labs: 02/28/2022: Magnesium 1.9 03/09/2022: ALT 31 03/21/2022: B Natriuretic Peptide 372.2 03/29/2022: Hemoglobin 12.5; Platelets 146 04/04/2022: TSH 2.650 04/11/2022: BUN 21; Creatinine, Ser 1.43; Potassium 4.8; Sodium 141  Recent Lipid Panel    Component Value Date/Time   CHOL 131 09/01/2022 0726   CHOL 141 06/02/2021 0732   TRIG 83.0 09/01/2022 0726   TRIG 67 11/20/2006 0748   HDL 50.00 09/01/2022 0726   HDL 51 06/02/2021 0732   CHOLHDL 3 09/01/2022 0726   VLDL 16.6 09/01/2022 0726   LDLCALC 65 09/01/2022 0726   LDLCALC 71 06/02/2021 0732     Risk Assessment/Calculations:    CHA2DS2-VASc Score = 5  This indicates a 7.2% annual risk of stroke. The patient's score is based upon: CHF History: 1 HTN History: 1 Diabetes History: 0 Stroke History: 0 Vascular Disease History: 1 Age Score: 2 Gender Score: 0    HYPERTENSION CONTROL Vitals:   09/13/22 1335 09/13/22 1345  BP: (!) 140/42 (!) 140/60    The patient's blood pressure is elevated above target today.  In order to address the patient's  elevated BP: Blood pressure will be monitored at home to determine if medication changes need to be made.            Physical Exam:    VS:  BP (!) 140/60 (BP Location: Right Arm, Patient Position: Sitting, Cuff Size: Normal)   Pulse (!) 58   Ht '5\' 1"'  (1.549 m)   Wt 121 lb (54.9 kg)   SpO2 97%   BMI 22.86 kg/m     Wt Readings from Last 3 Encounters:  09/13/22 121 lb (54.9 kg)  09/07/22 121 lb 3.2 oz (55 kg)  09/02/22 119 lb 2 oz (54 kg)     GEN: Well nourished, well developed in no acute distress HEENT: Normal NECK: No JVD; No carotid bruits CARDIAC: S1/S2, slow rate and regular rhythm, Grade 2/6 systolic murmur noted, no rubs or gallops heard; 2+ peripheral pulses throughout, strong and equal bilaterally RESPIRATORY:  Clear to auscultation without rales, wheezing or rhonchi  ABDOMEN: Soft, non-tender, non-distended MUSCULOSKELETAL:  No edema; No deformity  SKIN: Warm and dry NEUROLOGIC:  Alert and oriented x 3 PSYCHIATRIC:  Normal affect   ASSESSMENT:    1. Preoperative cardiovascular examination   2. Coronary artery disease involving native heart without angina pectoris, unspecified vessel or lesion type   3. S/P CABG 1997   4. History of non-ST elevation myocardial infarction (NSTEMI)   5. Mixed hyperlipidemia   6. V-tach (HCC)   7. Persistent atrial fibrillation ([place])   8. Chronic systolic heart failure (HCC)   9. Essential hypertension   10. Aortic stenosis, severe   11. 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    12. Stage  3b chronic kidney disease ([place])    PLAN:    In order of problems listed above:  Preoperative cardiovascular risk assessment    Jesus [name]'s perioperative risk of a major cardiac event is 6.6% according to the Revised Cardiac Risk Index (RCRI).  Therefore, he is at high risk for perioperative complications.   His functional capacity is good at 6.05 METs according to the Duke Activity Status Index  (DASI). Recommendations: According to ACC/AHA guidelines, no further cardiovascular testing needed.  The patient may proceed to surgery at acceptable risk.   Antiplatelet and/or Anticoagulation Recommendations: At this point, surgery will need to delayed as patient must remain on Plavix uninterrupted until March 2024.  Will route to Dr. [name] regarding Plavix being held, pt received stent in March 2023, therefore DAPT should be continued uninterrupted minimum of 6 months to 1 year.  Would require SBE prophylaxis due to history of previous TAVR.  Addendum 09/16/22: See Dr. Antionette Char progress note with recommendations for antiplatelet and/or anticoagulation. After speaking with patient on phone today, patient is requesting to wait until after March 2024 to proceed with dental surgery, as he states the surgery will be done in phases and it is elective. Will route this note to the requesting party.   2. CAD, s/p CABG, hx of NSTEMI Successful drug-eluting stent placed to proximal body of SVG to PDA.  Previous recommendation by Dr. Angelena Form stated to continue aspirin/Plavix until he is therapeutic on Coumadin and then consider stopping aspirin while continuing Plavix with Coumadin.  Stable with no anginal symptoms. No indication for ischemic evaluation.  Continue current medication regimen.  Not on beta blocker due to history of bradycardia.  Will route note to Dr. [name] for further recommendations.  3. HLD Last LDL 65, total cholesterol 131.  Continue Lipitor and Zetia. Heart healthy diet and regular cardiovascular exercise encouraged.   4. History of monomorphic V. tach 5. Atrial fibrillation No intercurrent A-fib or VT that patient is aware of.  Denies any tachycardia or palpitations recently.  Tolerating amiodarone well.  TSH normal in May 2023.  Would benefit from checking liver function test at next follow-up visit. Continue Amiodarone 200 mg daily. Will follow-up with Dr. Caryl Comes in November 2023.   Continue follow-up with Coumadin clinic for dosing of warfarin.  4. HFrEF LVEF 35 to 40% in March 2023, previous EF 45 to 50%.  Left ventricle demonstrates RWMA, inferior/inferolateral akinesis, grade 2 DD. Euvolemic and well compensated on exam. Low sodium diet, fluid restriction <2L, and daily weights encouraged. Educated to contact our office for weight gain of 2 lbs overnight or 5 lbs in one week.  Continue Lipitor, Plavix, Zetia, Lasix, Jardiance, nitroglycerin as needed, and valsartan.   5. HTN Initial BP 140/42, repeat BP 140/60.  BP not well controlled at home. Discussed to monitor BP at home at least 2 hours after medications and sitting for 5-10 minutes.  Discussed to monitor this for the next 2 weeks and let me know readings.  Low salt, Heart healthy diet and regular cardiovascular exercise encouraged.  If SBP not well controlled within 2 weeks, plan to initiate low dose hydralazine due to history of CKD.   6. Severe aortic stenosis, status post TAVR in 2016 2D echo in 2023 revealed mild regurgitation.  Echo findings were consistent with perivalvular leak of the aortic prosthesis. Completely asymptomatic, and has been stable over time.  Continue annual echo surveillance.  Continue to follow-up with Dr. [name].   7. Stage 3b CKD  Recent sCr 1.43, eGFR 49.  Baseline seems to be around 1.30-1.50.  We will obtain BMET today as mentioned above.  8. Disposition: Follow up with Dr. [name] or APP in 2 months or sooner if anything changes.    Medication Adjustments/Labs and Tests Ordered: Current medicines are reviewed at length with the patient today.  Concerns regarding medicines are outlined above.  Orders Placed This Encounter  Procedures   Basic metabolic panel   EKG [id number]   No orders of the defined types were placed in this encounter.   Patient Instructions  Medication Instructions:  Your physician recommends that you continue on your current medications as directed. Please  refer to the Current Medication list given to you today.  *If you need a refill on your cardiac medications before your next appointment, please call your pharmacy*   Lab Work: TODAY:  BMET  If you have labs (blood work) drawn today and your tests are completely normal, you will receive your results only by: [place] (if you have MyChart) OR A paper copy in the mail If you have any lab test that is abnormal or we need to change your treatment, we will call you to review the results.   Testing/Procedures: None ordered   Follow-Up: At [facility], you and your health needs are our priority.  As part of our continuing mission to provide you with exceptional heart care, we have created designated Provider Care Teams.  These Care Teams include your primary Cardiologist (physician) and Advanced Practice Providers (APPs -  Physician Assistants and Nurse Practitioners) who all work together to provide you with the care you need, when you need it.  We recommend signing up for the patient portal called "MyChart".  Sign up information is provided on this After Visit Summary.  MyChart is used to connect with patients for Virtual Visits (Telemedicine).  Patients are able to view lab/test results, encounter notes, upcoming appointments, etc.  Non-urgent messages can be sent to your provider as well.   To learn more about what you can do with MyChart, go to [email or web address].    Your next appointment:   11/21/22  ARRIVE AT 8:45  The format for your next appointment:   In Person  Provider:   Dr. [name]    Other Instructions   Important Information About Sugar         Signed, [name], NP  09/13/2022 5:06 PM    [place]

## 2022-09-13 ENCOUNTER — Ambulatory Visit: Payer: Medicare Other | Attending: Physician Assistant | Admitting: Nurse Practitioner

## 2022-09-13 ENCOUNTER — Encounter: Payer: Self-pay | Admitting: Nurse Practitioner

## 2022-09-13 VITALS — BP 140/60 | HR 58 | Ht 61.0 in | Wt 121.0 lb

## 2022-09-13 DIAGNOSIS — Z952 Presence of prosthetic heart valve: Secondary | ICD-10-CM

## 2022-09-13 DIAGNOSIS — E782 Mixed hyperlipidemia: Secondary | ICD-10-CM | POA: Diagnosis not present

## 2022-09-13 DIAGNOSIS — I5022 Chronic systolic (congestive) heart failure: Secondary | ICD-10-CM

## 2022-09-13 DIAGNOSIS — I4819 Other persistent atrial fibrillation: Secondary | ICD-10-CM | POA: Diagnosis not present

## 2022-09-13 DIAGNOSIS — I10 Essential (primary) hypertension: Secondary | ICD-10-CM | POA: Diagnosis not present

## 2022-09-13 DIAGNOSIS — I472 Ventricular tachycardia, unspecified: Secondary | ICD-10-CM

## 2022-09-13 DIAGNOSIS — I350 Nonrheumatic aortic (valve) stenosis: Secondary | ICD-10-CM | POA: Diagnosis not present

## 2022-09-13 DIAGNOSIS — N1832 Chronic kidney disease, stage 3b: Secondary | ICD-10-CM | POA: Diagnosis not present

## 2022-09-13 DIAGNOSIS — Z951 Presence of aortocoronary bypass graft: Secondary | ICD-10-CM

## 2022-09-13 DIAGNOSIS — I2510 Atherosclerotic heart disease of native coronary artery without angina pectoris: Secondary | ICD-10-CM

## 2022-09-13 DIAGNOSIS — I359 Nonrheumatic aortic valve disorder, unspecified: Secondary | ICD-10-CM | POA: Diagnosis not present

## 2022-09-13 DIAGNOSIS — I252 Old myocardial infarction: Secondary | ICD-10-CM | POA: Diagnosis not present

## 2022-09-13 DIAGNOSIS — Z01810 Encounter for preprocedural cardiovascular examination: Secondary | ICD-10-CM

## 2022-09-13 NOTE — Patient Instructions (Addendum)
Medication Instructions:  Your physician recommends that you continue on your current medications as directed. Please refer to the Current Medication list given to you today.  *If you need a refill on your cardiac medications before your next appointment, please call your pharmacy*   Lab Work: TODAY:  BMET  If you have labs (blood work) drawn today and your tests are completely normal, you will receive your results only by: [place] (if you have MyChart) OR A paper copy in the mail If you have any lab test that is abnormal or we need to change your treatment, we will call you to review the results.   Testing/Procedures: None ordered   Follow-Up: At [facility], you and your health needs are our priority.  As part of our continuing mission to provide you with exceptional heart care, we have created designated Provider Care Teams.  These Care Teams include your primary Cardiologist (physician) and Advanced Practice Providers (APPs -  Physician Assistants and Nurse Practitioners) who all work together to provide you with the care you need, when you need it.  We recommend signing up for the patient portal called "MyChart".  Sign up information is provided on this After Visit Summary.  MyChart is used to connect with patients for Virtual Visits (Telemedicine).  Patients are able to view lab/test results, encounter notes, upcoming appointments, etc.  Non-urgent messages can be sent to your provider as well.   To learn more about what you can do with MyChart, go to [email or web address].    Your next appointment:   11/21/22  ARRIVE AT 8:45  The format for your next appointment:   In Person  Provider:   Dr. [name]    Other Instructions   Important Information About Sugar

## 2022-09-14 LAB — BASIC METABOLIC PANEL
BUN/Creatinine Ratio: 13 (ref 10–24)
BUN: 14 mg/dL (ref 8–27)
CO2: 22 mmol/L (ref 20–29)
Calcium: 9 mg/dL (ref 8.6–10.2)
Chloride: 105 mmol/L (ref 96–106)
Creatinine, Ser: 1.09 mg/dL (ref 0.76–1.27)
Glucose: 90 mg/dL (ref 70–99)
Potassium: 4.6 mmol/L (ref 3.5–5.2)
Sodium: 143 mmol/L (ref 134–144)
eGFR: 67 mL/min/{1.73_m2} (ref >59)

## 2022-09-14 NOTE — Progress Notes (Signed)
Looks like he's out beyond 6 months if he really needs to move forward with surgery now the plavix could be held with him at low risk of stent thrombosis or ischemic outcomes.

## 2022-09-16 ENCOUNTER — Other Ambulatory Visit: Payer: Self-pay

## 2022-09-16 ENCOUNTER — Telehealth: Payer: Self-pay | Admitting: Nurse Practitioner

## 2022-09-16 ENCOUNTER — Telehealth: Payer: Self-pay | Admitting: Family Medicine

## 2022-09-16 DIAGNOSIS — Z7901 Long term (current) use of anticoagulants: Secondary | ICD-10-CM

## 2022-09-16 MED ORDER — WARFARIN SODIUM 4 MG PO TABS: ORAL_TABLET | ORAL | 2 refills | Status: DC

## 2022-09-16 NOTE — Telephone Encounter (Signed)
Called and spoke with patient and discussed with him Dr. Antionette Char recommendations.  See Dr. Antionette Char recent progress note.  Patient requesting to delay dental surgery at this time since it will be done in phases.  He stated he would rather wait until after March 2024 to proceed with dental surgery or if he develops any significant pain.  He states right now it is elective and he is doing well.  He verbalized understanding of our conversation and was appreciative of my call today.  [name], NP

## 2022-09-16 NOTE — Telephone Encounter (Signed)
Calling for clarification on which of the prescriptions for warfarin should be filled warfarin (COUMADIN) 4 MG tablet 35 tablet 2 09/16/2022    Sig: TAKE 1 TABLET BY MOUTH DAILY EXCEPT TAKE 1/2 TABLET ON MONDAYS AND THURSDAYS OR AS DIRECTED BY ANTICOAGULATION CLINIC    warfarin (COUMADIN) 4 MG tablet 75 tablet 2 09/16/2022    Sig: TAKE 1 TABLET BY MOUTH DAILY EXCEPT TAKE 1/2 TABLET ON MONDAYS, WEDNESDAYS AND FRIDAYS OR AS DIRECTED BY ANTICOAGULATION CLINIC

## 2022-09-19 ENCOUNTER — Other Ambulatory Visit: Payer: Self-pay

## 2022-09-19 DIAGNOSIS — Z7901 Long term (current) use of anticoagulants: Secondary | ICD-10-CM

## 2022-09-19 MED ORDER — WARFARIN SODIUM 4 MG PO TABS: ORAL_TABLET | ORAL | 2 refills | Status: DC

## 2022-09-19 NOTE — Progress Notes (Signed)
Contacted pharmacy to verify that this is the correct warfarin rx. Was asked to reorder so they can correct their records.

## 2022-09-19 NOTE — Telephone Encounter (Signed)
Noted  

## 2022-09-19 NOTE — Progress Notes (Signed)
Removed duplicate warfarin prescription

## 2022-09-30 ENCOUNTER — Ambulatory Visit (INDEPENDENT_AMBULATORY_CARE_PROVIDER_SITE_OTHER): Payer: Medicare Other

## 2022-09-30 DIAGNOSIS — Z7901 Long term (current) use of anticoagulants: Secondary | ICD-10-CM

## 2022-09-30 LAB — POCT INR: INR: 2.7 (ref 2.0–3.0)

## 2022-09-30 NOTE — Progress Notes (Signed)
Continue to take 1 tablet daily except take 1/2 tablet on Mondays, Wednesday, and Friday. Recheck in 6 weeks.

## 2022-09-30 NOTE — Patient Instructions (Signed)
Continue to take 1 tablet daily except take 1/2 tablet on Mondays, Wednesday, and Friday. Recheck in 6 weeks.

## 2022-10-03 ENCOUNTER — Telehealth: Payer: Self-pay | Admitting: *Deleted

## 2022-10-03 NOTE — Patient Outreach (Signed)
  Care Coordination   10/03/2022 Name: Jesus [name] MRN: [id number] DOB: Oct 11, 1941   Care Coordination Outreach Attempts:  An unsuccessful telephone outreach was attempted today to offer the patient information about available care coordination services as a benefit of their health plan.   Follow Up Plan:  Additional outreach attempts will be made to offer the patient care coordination information and services.   Encounter Outcome:  No Answer  Care Coordination Interventions Activated:  No   Care Coordination Interventions:  No, not indicated    Raina Mina, RN Care Management Coordinator [place] Office [phone]

## 2022-10-04 ENCOUNTER — Other Ambulatory Visit: Payer: Self-pay

## 2022-10-04 DIAGNOSIS — I472 Ventricular tachycardia, unspecified: Secondary | ICD-10-CM | POA: Insufficient documentation

## 2022-10-04 MED ORDER — EZETIMIBE 10 MG PO TABS: 10.0000 mg | ORAL_TABLET | Freq: Every day | ORAL | 1 refills | Status: DC

## 2022-10-05 ENCOUNTER — Encounter: Payer: Self-pay | Admitting: Internal Medicine

## 2022-10-05 ENCOUNTER — Ambulatory Visit: Payer: Medicare Other | Attending: Internal Medicine | Admitting: Internal Medicine

## 2022-10-05 VITALS — BP 138/66 | HR 55 | Ht 61.0 in | Wt 120.6 lb

## 2022-10-05 DIAGNOSIS — I472 Ventricular tachycardia, unspecified: Secondary | ICD-10-CM

## 2022-10-05 DIAGNOSIS — I255 Ischemic cardiomyopathy: Secondary | ICD-10-CM | POA: Diagnosis not present

## 2022-10-05 DIAGNOSIS — I4891 Unspecified atrial fibrillation: Secondary | ICD-10-CM

## 2022-10-05 MED ORDER — EZETIMIBE 10 MG PO TABS: 10.0000 mg | ORAL_TABLET | Freq: Every day | ORAL | 3 refills | Status: DC

## 2022-10-05 MED ORDER — AMIODARONE HCL 200 MG PO TABS: ORAL_TABLET | ORAL | 3 refills | Status: DC

## 2022-10-05 MED ORDER — VALSARTAN 80 MG PO TABS: 80.0000 mg | ORAL_TABLET | Freq: Every day | ORAL | 3 refills | Status: DC

## 2022-10-05 NOTE — Progress Notes (Signed)
Patient Care Team: [name], MD as PCP - General (Family Medicine) [name], MD as PCP - Cardiology (Cardiology) [name], MD as PCP - Electrophysiology (Cardiology) [name], [facility] as Pharmacist (Pharmacist)   HPI  Jesus [name] is a 81 y.o. 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  He has a history of ischemic cardiomyopathy with remote CABG, valvular heart disease and remote TAVR.  Also has atrial fibrillation for which he was taking amiodarone; this was uptitrated in the setting of his ventricular tachycardia with its subsequent obliteration.  anticoagulation with warfarin   The patient denies chest pain , shortness of breath, nocturnal dyspnea, orthopnea or peripheral edema.  There have been no palpitations, lightheadedness or syncope.[name]      DATE TEST EF   6/*22 Echo  45-50% Prior IMI  3/13 LHC    % LIMA-LADp; SVG-OM1-OM2-OM3p SVG-RVM-p SVG-PDA 80%>Stent   3/23 Echo  30-35%         Date Cr K Hgb TSH LFTs  12/22    4.32   4/23 1.33 4.8 12.5  2.65 22             Records and Results Reviewed   Past Medical History:  Diagnosis Date   Acute myocardial infarction of inferior wall ([place]) 1980   Aortic stenosis    mild with a mean aortic valve gradient of 12 mmHg   Atrial fibrillation (HCC)    holding sinus rhythm on Amiodarone   CAD (coronary artery disease)    a. S/P Ant MI 1980;  b. 1997 S/P CABG x 8 (LIMA to diag-LAD, SVG to OM1-OM2-OM3, SVG to [facility] - Dr [name]);  c. 01/2015 Cath: 3VD, 8/8 patent grafts.   Cardiomyopathy    Dysrhythmia    Esophageal reflux    GERD (gastroesophageal reflux disease)    Headache    History of colonoscopy    History of transesophageal echocardiography (TEE) for monitoring    Other and unspecified hyperlipidemia    Paroxysmal atrial fibrillation ([place]) 02/20/2022   S/P angioplasty with  stent 02/17/22 DES to proximal VG to PDA 02/20/2022   S/P TAVR (transcatheter aortic valve replacement)    a. 03/2015 26 mm Edwards Sapien 3 transcatheter heart valve placed via open left transfemoral approach.   Severe aortic stenosis 08/10/2012   Skin lesions, generalized    facial which may represent actinic keratoses and possible photosensitivity from Amiodarone   Unspecified essential hypertension     Past Surgical History:  Procedure Laterality Date   CARDIAC CATHETERIZATION     CARDIOVERSION  11/18/2006   Dr. [name]   CATARACT EXTRACTION W/ INTRAOCULAR LENS IMPLANT  April '13  (Dr. [name])   left eye only   CHOLECYSTECTOMY     CORONARY ARTERY BYPASS GRAFT  02/09/1996   LIMA to diag-LAD, SVG to OM1-OM2-OM3, SVG to [facility]   CORONARY STENT INTERVENTION N/A 02/17/2022   Procedure: CORONARY STENT INTERVENTION;  Surgeon: Burnell Blanks, MD;  Location: [place] CV LAB;  Service: Cardiovascular;  Laterality: N/A;   CORONARY/GRAFT ANGIOGRAPHY N/A 02/17/2022   Procedure: CORONARY/GRAFT ANGIOGRAPHY;  Surgeon: Burnell Blanks, MD;  Location: [place] CV LAB;  Service: Cardiovascular;  Laterality: N/A;   EYE SURGERY     LEFT AND RIGHT HEART CATHETERIZATION WITH CORONARY/GRAFT ANGIOGRAM N/A 01/26/2015   Procedure: LEFT AND RIGHT HEART CATHETERIZATION WITH CORONARY/GRAFT ANGIOGRAM;  Surgeon:  [name], MD;  Location: [facility] CATH LAB;  Service: Cardiovascular;  Laterality: N/A;   TEE WITHOUT CARDIOVERSION N/A 03/10/2015   Procedure: TRANSESOPHAGEAL ECHOCARDIOGRAM (TEE);  Surgeon: [name], MD;  Location: [place];  Service: Open Heart Surgery;  Laterality: N/A;   TONSILLECTOMY     TRANSCATHETER AORTIC VALVE REPLACEMENT, TRANSFEMORAL N/A 03/10/2015   Procedure: TRANSCATHETER AORTIC VALVE REPLACEMENT, TRANSFEMORAL;  Surgeon: [name], MD;  Location: [place];  Service: Open Heart Surgery;  Laterality: N/A;    Current Meds  Medication Sig   acetaminophen (TYLENOL)  325 MG tablet Take 1-2 tablets (325-650 mg total) by mouth every 4 (four) hours as needed for mild pain.   albuterol (VENTOLIN HFA) 108 (90 Base) MCG/ACT inhaler INHALE 2 PUFFS BY MOUTH EVERY 4 HOURS ASNEEDED FOR WHEEZING OR SHORTNESS OF BREATH.   amiodarone (PACERONE) 200 MG tablet Take 1 tablet (200 mg total) by mouth daily.   amoxicillin (AMOXIL) 500 MG capsule Take 4 tablets 1 hour prior to dental procedure   Ascorbic Acid (VITAMIN C) 1000 MG tablet Take 1,000 mg by mouth daily.   atorvastatin (LIPITOR) 80 MG tablet Take 1 tablet (80 mg total) by mouth daily.   clopidogrel (PLAVIX) 75 MG tablet Take 1 tablet (75 mg total) by mouth daily with breakfast.   diclofenac Sodium (VOLTAREN) 1 % GEL Apply 4 g topically 4 (four) times daily.   ezetimibe (ZETIA) 10 MG tablet Take 1 tablet (10 mg total) by mouth daily.   furosemide (LASIX) 20 MG tablet Take 1 tablet (20 mg total) by mouth daily as needed. For swelling in the legs, shortness of breath or if your weight goes up by 2 lbs overnight. Call cardiology for input.   JARDIANCE 10 MG TABS tablet TAKE 1 TABLET BY MOUTH EVERY DAY   loratadine (CLARITIN) 10 MG tablet Take 10 mg by mouth daily as needed for allergies.   melatonin 5 MG TABS Take 1 tablet (5 mg total) by mouth at bedtime as needed (sleep).   Multiple Vitamins-Minerals (MULTIVITAMIN,TX-MINERALS) tablet Take 1 tablet by mouth daily.   nitroGLYCERIN (NITROSTAT) 0.4 MG SL tablet Place 1 tablet (0.4 mg total) under the tongue every 5 (five) minutes x 3 doses as needed for chest pain.   valsartan (DIOVAN) 40 MG tablet Take 1 tablet (40 mg total) by mouth daily.   warfarin (COUMADIN) 4 MG tablet TAKE 1 TABLET BY MOUTH DAILY EXCEPT TAKE 1/2 TABLET ON MONDAYS, WEDNESDAYS AND FRIDAYS OR AS DIRECTED BY ANTICOAGULATION CLINIC    Allergies  Allergen Reactions   Amlodipine Other (See Comments)    Swelling?   Codeine Other (See Comments)    Pt does not remember   Iodine Swelling      Review  of Systems negative except from HPI and PMH  Physical Exam BP 138/66   Pulse (!) 55   Ht _0  (1.549 m)   Wt 120 lb 9.6 oz (54.7 kg)   SpO2 96%   BMI 22.79 kg/m  Well developed and nourished in no acute distress HENT normal Neck supple with JVP-  flat  Clear scoliosis Regular rate and rhythm, 3/6 systolic and 2/4 diastolic murmur Abd-soft with active BS No Clubbing cyanosis edema Skin-warm and dry A & Oriented  Grossly normal sensory and motor function  ECG sinus @ 55 19/13/48   CrCl cannot be calculated (Patient's most recent lab result is older than the maximum 21 days allowed.).   Assessment and  Plan Repetitive monomorphic ventricular tachycardia  right bundle superior axis-largely asymptomatic   Non-STEMI status post PCI    Ischemic cardiomyopathy with remote CABG   Valvular heart disease-TAVR 2016   Atrial fibrillation persistent    Amiodarone  Senile [place]   No intercurrent ventricular tachycardia of which he is aware.  Currently on amiodarone 200 mg daily, will decrease it to 5 days a week.  Needs a TSH/LFTs for surveillance; have reached out to Dr. [name] and we will increase his valsartan from 40--80.    in the context of his ischemic cardiomyopathy; we will check c-Met and TSH in about 2 weeks.  [facility]. [facility] will review with him at their next visit the addition spironolactone.  No interval atrial fibrillation.  Again of which he is aware.  On warfarin without bleeding.  No symptoms of angina.  Continue Plavix  Had    Current medicines are reviewed at length with the patient today .  The patient does not  have concerns regarding medicines.

## 2022-10-05 NOTE — Patient Instructions (Addendum)
Medication Instructions:  Your physician has recommended you make the following change in your medication:   Decrease Amiodarone '200mg'$  - to 1 tablet by mouth 5 days per week. (Monday-Friday)  Stop Valsartan '40mg'$   Begin Valsartan '80mg'$  - 1 tablet by mouth daily.  *If you need a refill on your cardiac medications before your next appointment, please call your pharmacy*   Lab Work: TSH and CMET   If you have labs (blood work) drawn today and your tests are completely normal, you will receive your results only by: [place] (if you have MyChart) OR A paper copy in the mail If you have any lab test that is abnormal or we need to change your treatment, we will call you to review the results.   Testing/Procedures: None ordered.    Follow-Up: At [facility], you and your health needs are our priority.  As part of our continuing mission to provide you with exceptional heart care, we have created designated Provider Care Teams.  These Care Teams include your primary Cardiologist (physician) and Advanced Practice Providers (APPs -  Physician Assistants and Nurse Practitioners) who all work together to provide you with the care you need, when you need it.  We recommend signing up for the patient portal called "MyChart".  Sign up information is provided on this After Visit Summary.  MyChart is used to connect with patients for Virtual Visits (Telemedicine).  Patients are able to view lab/test results, encounter notes, upcoming appointments, etc.  Non-urgent messages can be sent to your provider as well.   To learn more about what you can do with MyChart, go to [email or web address].    Your next appointment:   6 months with Dr Caryl Comes  Important Information About Sugar

## 2022-10-06 NOTE — Addendum Note (Signed)
Addended by: Michelle Nasuti on: 10/06/2022 01:33 PM   Modules accepted: Orders

## 2022-10-07 DIAGNOSIS — S51811A Laceration without foreign body of right forearm, initial encounter: Secondary | ICD-10-CM | POA: Diagnosis not present

## 2022-10-10 ENCOUNTER — Ambulatory Visit: Payer: Medicare Other | Admitting: Physician Assistant

## 2022-10-19 ENCOUNTER — Ambulatory Visit: Payer: Medicare Other | Attending: Cardiovascular Disease

## 2022-10-19 DIAGNOSIS — I472 Ventricular tachycardia, unspecified: Secondary | ICD-10-CM | POA: Diagnosis not present

## 2022-10-19 DIAGNOSIS — I4891 Unspecified atrial fibrillation: Secondary | ICD-10-CM | POA: Diagnosis not present

## 2022-10-19 DIAGNOSIS — I255 Ischemic cardiomyopathy: Secondary | ICD-10-CM

## 2022-10-20 ENCOUNTER — Telehealth: Payer: Self-pay | Admitting: Pharmacist

## 2022-10-20 ENCOUNTER — Telehealth: Payer: Self-pay

## 2022-10-20 LAB — TSH: TSH: 3 u[IU]/mL (ref 0.450–4.500)

## 2022-10-20 LAB — COMPREHENSIVE METABOLIC PANEL
ALT: 35 IU/L (ref 0–44)
AST: 39 IU/L (ref 0–40)
Albumin/Globulin Ratio: 1.4 (ref 1.2–2.2)
Albumin: 3.7 g/dL (ref 3.7–4.7)
Alkaline Phosphatase: 132 IU/L — ABNORMAL HIGH (ref 44–121)
BUN/Creatinine Ratio: 15 (ref 10–24)
BUN: 18 mg/dL (ref 8–27)
Bilirubin Total: 0.4 mg/dL (ref 0.0–1.2)
CO2: 24 mmol/L (ref 20–29)
Calcium: 8.7 mg/dL (ref 8.6–10.2)
Chloride: 104 mmol/L (ref 96–106)
Creatinine, Ser: 1.19 mg/dL (ref 0.76–1.27)
Globulin, Total: 2.6 g/dL (ref 1.5–4.5)
Glucose: 99 mg/dL (ref 70–99)
Potassium: 4.5 mmol/L (ref 3.5–5.2)
Sodium: 141 mmol/L (ref 134–144)
Total Protein: 6.3 g/dL (ref 6.0–8.5)
eGFR: 61 mL/min/{1.73_m2} (ref >59)

## 2022-10-20 NOTE — Telephone Encounter (Signed)
--  pt states he noticed blood in his stool last night. blood when wiping.   10/20/2022 9:29:36 AM Go to ED Now (or PCP triage) Altamease Oiler, RN, Adriana  Comments User: Kizzie Fantasia, RN Date/Time Eilene Ghazi Time): 10/20/2022 9:30:02 AM pt states he is on blood thinners but unsure of the name  Referrals GO TO [place] - SPECIFY  10/20/22 1533 - LVM instructions for pt to return call.

## 2022-10-20 NOTE — Chronic Care Management (AMB) (Signed)
Chronic Care Management Pharmacy Assistant   Name: Jesus [name]  MRN: [id number] DOB: March 12, 1941  Reason for Encounter: Disease State / Hypertension Assessment Call   Conditions to be addressed/monitored: HTN  Recent office visits:  09/07/2022 Jesus Littler MD - Patient was seen for essential hypertension. No medication changes. No follow up noted.   09/02/2022 Jesus Penna MD - Patient was seen for Contusion of rib on left side, initial encounter. No medication changes. No follow up noted  08/17/2022 Jesus Littler MD - Patient was seen for Muscle strain of right gluteal region. No medication changes. No follow up noted.  07/28/2022 Jesus Arbour LPN - Medicare annual wellness exam   Recent consult visits:  10/05/2022 [name] MD (cardiology) - Patient was seen for cardiomyopathy ischemic ER 35%+2. Amiodarone 200 mg changed to 1 tablet Mon-Fri. Valsartan increased to 80 mg daily. Follow up in 6 months.   09/13/2022 [name] NP (heart care) - Patient was seen for Preoperative cardiovascular examination and additional concerns. No medication changes. Follow up at next appt 11/21/22  Hospital visits:  None  Medications: Outpatient Encounter Medications as of 10/20/2022  Medication Sig   acetaminophen (TYLENOL) 325 MG tablet Take 1-2 tablets (325-650 mg total) by mouth every 4 (four) hours as needed for mild pain.   albuterol (VENTOLIN HFA) 108 (90 Base) MCG/ACT inhaler INHALE 2 PUFFS BY MOUTH EVERY 4 HOURS ASNEEDED FOR WHEEZING OR SHORTNESS OF BREATH.   amiodarone (PACERONE) 200 MG tablet Take 1 tablet by mouth 5 days per week. (Monday-Friday)   amoxicillin (AMOXIL) 500 MG capsule Take 4 tablets 1 hour prior to dental procedure   Ascorbic Acid (VITAMIN C) 1000 MG tablet Take 1,000 mg by mouth daily.   atorvastatin (LIPITOR) 80 MG tablet Take 1 tablet (80 mg total) by mouth daily.   clopidogrel (PLAVIX) 75 MG tablet Take 1 tablet (75 mg total) by mouth daily with  breakfast.   diclofenac Sodium (VOLTAREN) 1 % GEL Apply 4 g topically 4 (four) times daily.   ezetimibe (ZETIA) 10 MG tablet Take 1 tablet (10 mg total) by mouth daily.   furosemide (LASIX) 20 MG tablet Take 1 tablet (20 mg total) by mouth daily as needed. For swelling in the legs, shortness of breath or if your weight goes up by 2 lbs overnight. Call cardiology for input.   JARDIANCE 10 MG TABS tablet TAKE 1 TABLET BY MOUTH EVERY DAY   loratadine (CLARITIN) 10 MG tablet Take 10 mg by mouth daily as needed for allergies.   melatonin 5 MG TABS Take 1 tablet (5 mg total) by mouth at bedtime as needed (sleep).   Multiple Vitamins-Minerals (MULTIVITAMIN,TX-MINERALS) tablet Take 1 tablet by mouth daily.   nitroGLYCERIN (NITROSTAT) 0.4 MG SL tablet Place 1 tablet (0.4 mg total) under the tongue every 5 (five) minutes x 3 doses as needed for chest pain.   valsartan (DIOVAN) 80 MG tablet Take 1 tablet (80 mg total) by mouth daily.   warfarin (COUMADIN) 4 MG tablet TAKE 1 TABLET BY MOUTH DAILY EXCEPT TAKE 1/2 TABLET ON MONDAYS, WEDNESDAYS AND FRIDAYS OR AS DIRECTED BY ANTICOAGULATION CLINIC   No facility-administered encounter medications on file as of 10/20/2022.  Fill History:   Dispensed Days Supply Quantity Provider Pharmacy  ALBUTEROL SULFATE HFA  108 (90 Base) MCG/ACT AERS 01/27/2021 30 6.7 g      Dispensed Days Supply Quantity Provider Pharmacy  AMIODARONE HCL 200 MG TABLET 09/12/2022 40 80 each  Dispensed Days Supply Quantity Provider Pharmacy  ATORVASTATIN 80 MG TABLET 10/04/2022 30 30 each      Dispensed Days Supply Quantity Provider Pharmacy  CLOPIDOGREL 75 MG TABLET 10/04/2022 30 30 each      Dispensed Days Supply Quantity Provider Pharmacy  DICLOFENAC SODIUM  1 % GEL 02/25/2022 7 100 g      Dispensed Days Supply Quantity Provider Pharmacy  JARDIANCE 10 MG TABLET 10/12/2022 30 30 each      Dispensed Days Supply Quantity Provider Pharmacy  FUROSEMIDE  20 MG TABS 03/15/2022 30  30 tablet      Dispensed Days Supply Quantity Provider Pharmacy  VALSARTAN  80 MG TABS 10/05/2022 90 90 tablet      Dispensed Days Supply Quantity Provider Pharmacy  warfarin 4 mg tablet 09/29/2022 30 75 each      Reviewed chart prior to disease state call. Spoke with patient regarding BP  Recent Office Vitals: BP Readings from Last 3 Encounters:  10/05/22 138/66  09/13/22 (!) 140/60  09/07/22 (!) 124/50   Pulse Readings from Last 3 Encounters:  10/05/22 (!) 55  09/13/22 (!) 58  09/07/22 65    Wt Readings from Last 3 Encounters:  10/05/22 120 lb 9.6 oz (54.7 kg)  09/13/22 121 lb (54.9 kg)  09/07/22 121 lb 3.2 oz (55 kg)     Kidney Function Lab Results  Component Value Date/Time   CREATININE 1.19 10/19/2022 01:49 PM   CREATININE 1.09 09/13/2022 02:44 PM   CREATININE 0.91 09/12/2016 01:11 PM   CREATININE 0.88 03/11/2016 07:34 AM   GFR 47.03 (L) 02/23/2022 04:07 PM   GFRNONAA 44 (L) 03/21/2022 03:52 PM   GFRAA 90 11/04/2020 03:11 PM       Latest Ref Rng & Units 10/19/2022    1:49 PM 09/13/2022    2:44 PM 04/11/2022    3:34 PM  BMP  Glucose 70 - 99 mg/dL 99  90  113   BUN 8 - 27 mg/dL '18  14  21   '$ Creatinine 0.76 - 1.27 mg/dL 1.19  1.09  1.43   BUN/Creat Ratio 10 - '24 15  13  15   '$ Sodium 134 - 144 mmol/L 141  143  141   Potassium 3.5 - 5.2 mmol/L 4.5  4.6  4.8   Chloride 96 - 106 mmol/L 104  105  104   CO2 20 - 29 mmol/L '24  22  23   '$ Calcium 8.6 - 10.2 mg/dL 8.7  9.0  9.0     Current antihypertensive regimen:  Amiodarone 200 mg 1 tablet Mon-Fri Valsartan 80 mg daily  How often are you checking your Blood Pressure?   Current home BP readings:   What recent interventions/DTPs have been made by any provider to improve Blood Pressure control since last CPP Visit: Amiodarone 200 mg changed to 1 tablet Mon-Fri. Valsartan increased to 80 mg daily.   Any recent hospitalizations or ED visits since last visit with CPP? No recent hospital visits.   What diet  changes have been made to improve Blood Pressure Control?  Patient follows [place] exercise is being done to improve your Blood Pressure Control?    Adherence Review: Is the patient currently on ACE/ARB medication? Yes Does the patient have >5 day gap between last estimated fill dates? No  Unable to reach patient after several attempts  Care Gaps: AWV - scheduled 08/02/2023 Last BP - 138/66 on 10/05/2022 Covid - overdue  Flu - overdue  Star Rating Drugs: Atorvastatin 80 mg - last filled 10/04/2022 30 [facility] Jardiance 10 mg - last filled [facility]/2022 [facility]  Valsartan 80 mg - last filled 10/05/2022 90 DS at [facility] Pharmacist Assistant [phone]

## 2022-10-22 ENCOUNTER — Other Ambulatory Visit: Payer: Self-pay | Admitting: Cardiology

## 2022-10-24 ENCOUNTER — Telehealth: Payer: Self-pay

## 2022-10-24 NOTE — Telephone Encounter (Signed)
Error

## 2022-10-24 NOTE — Telephone Encounter (Signed)
Caller states his blood pressure is 160/50.  10/21/2022 8:29:14 AM Attempt made - message left Earleen Reaper  10/21/2022 8:48:34 AM Attempt made - message left Earleen Reaper  10/21/2022 9:05:33 AM FINAL ATTEMPT MADE - message left Yes Ysidro Evert RN, Levada Dy  Final Disposition 10/21/2022 9:05:33 AM FINAL ATTEMPT MADE - message left Yes Ysidro Evert, RN, Levada Dy  10/24/22 1108 - LVM instructions for pt to call back to schedule appt. If pt returns call please schedule appt OR transfer to [facility]

## 2022-10-24 NOTE — Telephone Encounter (Signed)
I spoke with the patient and OV has been scheduled. Patient advised to seek emergency care if he has any chest pain/tightness or Blood pressure worsens.

## 2022-10-24 NOTE — Telephone Encounter (Signed)
Pt call back and was transferred to [facility]

## 2022-10-24 NOTE — Progress Notes (Unsigned)
ACUTE VISIT No chief complaint on file.  HPI: Jesus [name] is a 81 y.o. male, who is here today complaining of *** HPI  Review of Systems See other pertinent positives and negatives in HPI.  Current Outpatient Medications on File Prior to Visit  Medication Sig Dispense Refill   acetaminophen (TYLENOL) 325 MG tablet Take 1-2 tablets (325-650 mg total) by mouth every 4 (four) hours as needed for mild pain.     albuterol (VENTOLIN HFA) 108 (90 Base) MCG/ACT inhaler INHALE 2 PUFFS BY MOUTH EVERY 4 HOURS ASNEEDED FOR WHEEZING OR SHORTNESS OF BREATH. 6.7 g 1   amiodarone (PACERONE) 200 MG tablet TAKE 2 TABLETS (400 MG TOTAL) BY MOUTH TWICE A DAY FOR 7 DAYS THEN TAKE 400 MG DAILY. 80 tablet 3   amoxicillin (AMOXIL) 500 MG capsule Take 4 tablets 1 hour prior to dental procedure 8 capsule 0   Ascorbic Acid (VITAMIN C) 1000 MG tablet Take 1,000 mg by mouth daily.     atorvastatin (LIPITOR) 80 MG tablet Take 1 tablet (80 mg total) by mouth daily. 90 tablet 3   clopidogrel (PLAVIX) 75 MG tablet Take 1 tablet (75 mg total) by mouth daily with breakfast. 90 tablet 1   diclofenac Sodium (VOLTAREN) 1 % GEL Apply 4 g topically 4 (four) times daily. 100 g 0   ezetimibe (ZETIA) 10 MG tablet Take 1 tablet (10 mg total) by mouth daily. 90 tablet 3   furosemide (LASIX) 20 MG tablet Take 1 tablet (20 mg total) by mouth daily as needed. For swelling in the legs, shortness of breath or if your weight goes up by 2 lbs overnight. Call cardiology for input. 30 tablet 0   JARDIANCE 10 MG TABS tablet TAKE 1 TABLET BY MOUTH EVERY DAY 30 tablet 2   loratadine (CLARITIN) 10 MG tablet Take 10 mg by mouth daily as needed for allergies.     melatonin 5 MG TABS Take 1 tablet (5 mg total) by mouth at bedtime as needed (sleep). 30 tablet 0   Multiple Vitamins-Minerals (MULTIVITAMIN,TX-MINERALS) tablet Take 1 tablet by mouth daily.     nitroGLYCERIN (NITROSTAT) 0.4 MG SL tablet Place 1 tablet (0.4 mg total) under the  tongue every 5 (five) minutes x 3 doses as needed for chest pain. 25 tablet 4   valsartan (DIOVAN) 80 MG tablet Take 1 tablet (80 mg total) by mouth daily. 90 tablet 3   warfarin (COUMADIN) 4 MG tablet TAKE 1 TABLET BY MOUTH DAILY EXCEPT TAKE 1/2 TABLET ON MONDAYS, WEDNESDAYS AND FRIDAYS OR AS DIRECTED BY ANTICOAGULATION CLINIC 75 tablet 2   No current facility-administered medications on file prior to visit.    Past Medical History:  Diagnosis Date   Acute myocardial infarction of inferior wall ([place]) 1980   Aortic stenosis    mild with a mean aortic valve gradient of 12 mmHg   Atrial fibrillation (HCC)    holding sinus rhythm on Amiodarone   CAD (coronary artery disease)    a. S/P Ant MI 1980;  b. 1997 S/P CABG x 8 (LIMA to diag-LAD, SVG to OM1-OM2-OM3, SVG to [facility] - Dr [name]);  c. 01/2015 Cath: 3VD, 8/8 patent grafts.   Cardiomyopathy    Dysrhythmia    Esophageal reflux    GERD (gastroesophageal reflux disease)    Headache    History of colonoscopy    History of transesophageal echocardiography (TEE) for monitoring    Other and unspecified hyperlipidemia    Paroxysmal atrial fibrillation (  [place]) 02/20/2022   S/P angioplasty with stent 02/17/22 DES to proximal VG to PDA 02/20/2022   S/P TAVR (transcatheter aortic valve replacement)    a. 03/2015 26 mm Edwards Sapien 3 transcatheter heart valve placed via open left transfemoral approach.   Severe aortic stenosis 08/10/2012   Skin lesions, generalized    facial which may represent actinic keratoses and possible photosensitivity from Amiodarone   Unspecified essential hypertension    Allergies  Allergen Reactions   Amlodipine Other (See Comments)    Swelling?   Codeine Other (See Comments)    Pt does not remember   Iodine Swelling    Social History   Socioeconomic History   Marital status: Married    Spouse name: Not on file   Number of children: Not on file   Years of education: Not on file   Highest education level:  Not on file  Occupational History   Not on file  Tobacco Use   Smoking status: Never   Smokeless tobacco: Never   Tobacco comments:    Does not smoke.  Vaping Use   Vaping Use: Never used  Substance and Sexual Activity   Alcohol use: No    Alcohol/week: 0.0 standard drinks of alcohol   Drug use: No   Sexual activity: Not Currently  Other Topics Concern   Not on file  Social History Narrative   HSG. [employer] 6 years. Married - '69 - 1 year/divorced. '76 - . No children. Work - mfg/textiles - Designer, multimedia; currently works doing maintenance. [employer] - they have discussed this. Provided packet august '13.                Social Determinants of Health   Financial Resource Strain: Low Risk  (07/28/2022)   Overall Financial Resource Strain (CARDIA)    Difficulty of Paying Living Expenses: Not hard at all  Food Insecurity: No Food Insecurity (07/28/2022)   Hunger Vital Sign    Worried About Running Out of Food in the Last Year: Never true    Ran Out of Food in the Last Year: Never true  Transportation Needs: No Transportation Needs (07/28/2022)   PRAPARE - Hydrologist (Medical): No    Lack of Transportation (Non-Medical): No  Physical Activity: Sufficiently Active (07/28/2022)   Exercise Vital Sign    Days of Exercise per Week: 3 days    Minutes of Exercise per Session: 60 min  Stress: No Stress Concern Present (07/28/2022)   [place]    Feeling of Stress : Not at all  Social Connections: Moderately Integrated (07/28/2022)   Social Connection and Isolation Panel [NHANES]    Frequency of Communication with Friends and Family: More than three times a week    Frequency of Social Gatherings with Friends and Family: More than three times a week    Attends Religious Services: More than 4 times per year    Active Member of [employer] or Organizations: Yes    Attends Theatre manager Meetings: More than 4 times per year    Marital Status: Widowed    There were no vitals filed for this visit. There is no height or weight on file to calculate BMI.  Physical Exam  ASSESSMENT AND PLAN: There are no diagnoses linked to this encounter.  No follow-ups on file.  Simona Rocque G. [place], MD  [facility]. [place] office.  Discharge Instructions   None

## 2022-10-25 ENCOUNTER — Ambulatory Visit (INDEPENDENT_AMBULATORY_CARE_PROVIDER_SITE_OTHER): Payer: Medicare Other

## 2022-10-25 ENCOUNTER — Encounter: Payer: Self-pay | Admitting: Family Medicine

## 2022-10-25 ENCOUNTER — Ambulatory Visit (INDEPENDENT_AMBULATORY_CARE_PROVIDER_SITE_OTHER): Payer: Medicare Other | Admitting: Family Medicine

## 2022-10-25 VITALS — BP 160/50 | HR 60 | Resp 16 | Ht 61.0 in | Wt 123.0 lb

## 2022-10-25 DIAGNOSIS — I10 Essential (primary) hypertension: Secondary | ICD-10-CM

## 2022-10-25 DIAGNOSIS — R0989 Other specified symptoms and signs involving the circulatory and respiratory systems: Secondary | ICD-10-CM

## 2022-10-25 DIAGNOSIS — R059 Cough, unspecified: Secondary | ICD-10-CM | POA: Diagnosis not present

## 2022-10-25 MED ORDER — VALSARTAN 80 MG PO TABS: 120.0000 mg | ORAL_TABLET | Freq: Every day | ORAL | 3 refills | Status: DC

## 2022-10-25 NOTE — Patient Instructions (Addendum)
A few things to remember from today's visit:  Essential hypertension  Bilateral rales - Plan: DG Chest 2 View Today valsartan increased from 1 tab to 1.5 tab. Monitor blood pressure daily. Lab in 10 days. Keep appt with cardiologist.   If you need refills for medications you take chronically, please call your pharmacy. Do not use My Chart to request refills or for acute issues that need immediate attention. If you send a my chart message, it may take a few days to be addressed, specially if I am not in the office.  Please be sure medication list is accurate. If a new problem present, please set up appointment sooner than planned today.

## 2022-10-25 NOTE — Assessment & Plan Note (Signed)
Bilateral rales noted on auscultation. He had a chest x-ray in 04/2022 for "abnormal lung sounds" and it was negative for acute cardiopulmonary process. Today he is reporting 2 to 3 days of nonproductive cough, no other associated symptoms. Instructed about warning signs. Chest x-ray ordered today.

## 2022-10-25 NOTE — Assessment & Plan Note (Signed)
BP rechecked x3 (RUE and LUE):160/50. We discussed possible complications of elevated BP. He agrees with increasing dose of valsartan from 80 mg to 120 mg (1.5 tablet). Continue monitoring BP daily at home and following low-salt diet. Instructed about warning signs. BMP in 10 days. He has appointment with his cardiologist on 11/21/2022.

## 2022-10-28 DIAGNOSIS — K625 Hemorrhage of anus and rectum: Secondary | ICD-10-CM | POA: Diagnosis not present

## 2022-10-28 DIAGNOSIS — K921 Melena: Secondary | ICD-10-CM | POA: Diagnosis not present

## 2022-11-01 ENCOUNTER — Ambulatory Visit (INDEPENDENT_AMBULATORY_CARE_PROVIDER_SITE_OTHER): Payer: Medicare Other | Admitting: Family Medicine

## 2022-11-01 ENCOUNTER — Encounter: Payer: Self-pay | Admitting: Family Medicine

## 2022-11-01 VITALS — BP 138/60 | HR 57 | Temp 98.3°F | Ht 61.0 in | Wt 122.9 lb

## 2022-11-01 DIAGNOSIS — K921 Melena: Secondary | ICD-10-CM

## 2022-11-01 DIAGNOSIS — K5900 Constipation, unspecified: Secondary | ICD-10-CM | POA: Diagnosis not present

## 2022-11-01 DIAGNOSIS — Z23 Encounter for immunization: Secondary | ICD-10-CM | POA: Diagnosis not present

## 2022-11-01 DIAGNOSIS — I10 Essential (primary) hypertension: Secondary | ICD-10-CM

## 2022-11-01 NOTE — Progress Notes (Signed)
Established Patient Office Visit  Subjective   Patient ID: Jesus [name], male    DOB: 09-20-1941  Age: 81 y.o. MRN: [id number]  Chief Complaint  Patient presents with   Hemorrhoids    X1 week     HPI   Jesus [name] is here with 2-week history of intermittent bright red blood per rectum.  This sounds like it was very small quantity of blood.  No associated pain.  Does sometimes strain with stools.  Last colonoscopy 2017.  He had benign polyps.  He is not aware of any recent external hemorrhoids or history of anal fissure.  He is on Coumadin and most recent INR 2.7.  Has not had any other sources of bleeding.  Appetite and weight stable.  Denies any dizziness.  Was seen here recently with elevated blood pressure.  He was instructed to increase valsartan to 1-1/2 tablets daily but not clear if he follow those instructions.  His blood pressure is improved today.  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 S/P Ant MI 1980;  b. 1997 S/P CABG x 8 (LIMA to diag-LAD, SVG to OM1-OM2-OM3, SVG to [facility] - Dr [name]);  c. 01/2015 Cath: 3VD, 8/8 patent grafts.   Cardiomyopathy    Dysrhythmia    Esophageal reflux    GERD (gastroesophageal reflux disease)    Headache    History of colonoscopy    History of transesophageal echocardiography (TEE) for monitoring    Other and unspecified hyperlipidemia    Paroxysmal atrial fibrillation ([place]) 02/20/2022   S/P angioplasty with stent 02/17/22 DES to proximal VG to PDA 02/20/2022   S/P TAVR (transcatheter aortic valve replacement)    a. 03/2015 26 mm Edwards Sapien 3 transcatheter heart valve placed via open left transfemoral approach.   Severe aortic stenosis 08/10/2012   Skin lesions, generalized    facial which may represent  actinic keratoses and possible photosensitivity from Amiodarone   Unspecified essential hypertension    Past Surgical History:  Procedure Laterality Date   CARDIAC CATHETERIZATION     CARDIOVERSION  11/18/2006   Dr. [name]   CATARACT EXTRACTION W/ INTRAOCULAR LENS IMPLANT  April '13  (Dr. [name])   left eye only   CHOLECYSTECTOMY     CORONARY ARTERY BYPASS GRAFT  02/09/1996   LIMA to diag-LAD, SVG to OM1-OM2-OM3, SVG to [facility]   CORONARY STENT INTERVENTION N/A 02/17/2022   Procedure: CORONARY STENT INTERVENTION;  Surgeon: Burnell Blanks, MD;  Location: [place] CV LAB;  Service: Cardiovascular;  Laterality: N/A;   CORONARY/GRAFT ANGIOGRAPHY N/A 02/17/2022   Procedure: CORONARY/GRAFT ANGIOGRAPHY;  Surgeon: Burnell Blanks, MD;  Location: [place] CV LAB;  Service: Cardiovascular;  Laterality: N/A;   EYE SURGERY     LEFT AND RIGHT HEART CATHETERIZATION WITH CORONARY/GRAFT ANGIOGRAM N/A 01/26/2015   Procedure: LEFT AND RIGHT HEART CATHETERIZATION WITH [name];  Surgeon: [name], MD;  Location: [facility] CATH LAB;  Service: Cardiovascular;  Laterality: N/A;   TEE WITHOUT CARDIOVERSION N/A 03/10/2015   Procedure: TRANSESOPHAGEAL ECHOCARDIOGRAM (TEE);  Surgeon: [name], MD;  Location: [place];  Service: Open Heart Surgery;  Laterality: N/A;   TONSILLECTOMY     TRANSCATHETER AORTIC VALVE REPLACEMENT, TRANSFEMORAL N/A  03/10/2015   Procedure: TRANSCATHETER AORTIC VALVE REPLACEMENT, TRANSFEMORAL;  Surgeon: [name], MD;  Location: [place];  Service: Open Heart Surgery;  Laterality: N/A;    reports that he has never smoked. He has never used smokeless tobacco. He reports that he does not drink alcohol and does not use drugs. family history includes Atrial fibrillation in his brother; Crohn's disease in his mother; Heart attack (age of onset: 15) in his father; Heart disease in his father and paternal uncle; Heart failure in his father; Prostate  cancer in his paternal uncle; Stroke (age of onset: 63) in his mother. Allergies  Allergen Reactions   Amlodipine Other (See Comments)    Swelling?   Codeine Other (See Comments)    Pt does not remember   Iodine Swelling    Review of Systems  Constitutional:  Negative for malaise/fatigue.  Eyes:  Negative for blurred vision.  Respiratory:  Negative for shortness of breath.   Cardiovascular:  Negative for chest pain.  Gastrointestinal:  Positive for blood in stool and constipation. Negative for abdominal pain, melena, nausea and vomiting.  Neurological:  Negative for dizziness, weakness and headaches.      Objective:     BP 138/60 (BP Location: Left Arm, Patient Position: Sitting, Cuff Size: Normal)   Pulse (!) 57   Temp 98.3 F (36.8 C) (Oral)   Ht '5\' 1"'$  (1.549 m)   Wt 122 lb 14.4 oz (55.7 kg)   SpO2 98%   BMI 23.22 kg/m  BP Readings from Last 3 Encounters:  11/01/22 138/60  10/25/22 (!) 160/50  10/05/22 138/66   Wt Readings from Last 3 Encounters:  11/01/22 122 lb 14.4 oz (55.7 kg)  10/25/22 123 lb (55.8 kg)  10/05/22 120 lb 9.6 oz (54.7 kg)      Physical Exam Vitals reviewed.  Cardiovascular:     Rate and Rhythm: Normal rate.  Pulmonary:     Effort: Pulmonary effort is normal.     Breath sounds: Normal breath sounds.  Genitourinary:    Comments: Rectal exam reveals no external hemorrhoids.  No visible anal fissure.  Digital exam reveals some firm stool in the rectal vault but no impaction.  No rectal masses otherwise palpated.  Stool is brown and Hemoccult negative Neurological:     Mental Status: He is alert.      No results found for any visits on 11/01/22.    The ASCVD Risk score (Arnett DK, et al., 2019) failed to calculate for the following reasons:   The 2019 ASCVD risk score is only valid for ages 65 to 26   The patient has a prior MI or stroke diagnosis    Assessment & Plan:   Problem List Items Addressed This Visit   None Visit  Diagnoses     Hematochezia    -  Primary   Relevant Orders   CBC with Differential/Platelet   Iron, TIBC and Ferritin Panel     2 weeks of reported intermittent small-volume hematochezia.  He states he has seen some evidence for blood on the surface of the stools.  No evidence for external hemorrhoid or anal fissure. -Check CBC along with iron studies -Observe for now if above normal -Follow-up immediately for any heavy rectal bleeding or continued occurrence -Discussed measures to reduce constipation.  Increase fiber and fluid intake  Hypertension-improved today.  Continue close monitoring.  Continue current dose of valsartan.  No follow-ups on file.    [name], MD

## 2022-11-04 ENCOUNTER — Other Ambulatory Visit (INDEPENDENT_AMBULATORY_CARE_PROVIDER_SITE_OTHER): Payer: Medicare Other

## 2022-11-04 DIAGNOSIS — I10 Essential (primary) hypertension: Secondary | ICD-10-CM

## 2022-11-04 DIAGNOSIS — K921 Melena: Secondary | ICD-10-CM

## 2022-11-04 LAB — CBC WITH DIFFERENTIAL/PLATELET
Basophils Absolute: 0.1 10*3/uL (ref 0.0–0.1)
Basophils Relative: 0.7 % (ref 0.0–3.0)
Eosinophils Absolute: 0.1 10*3/uL (ref 0.0–0.7)
Eosinophils Relative: 0.9 % (ref 0.0–5.0)
HCT: 37.4 % — ABNORMAL LOW (ref 39.0–52.0)
Hemoglobin: 12.4 g/dL — ABNORMAL LOW (ref 13.0–17.0)
Lymphocytes Relative: 12.4 % (ref 12.0–46.0)
Lymphs Abs: 1.1 10*3/uL (ref 0.7–4.0)
MCHC: 33.2 g/dL (ref 30.0–36.0)
MCV: 102 fl — ABNORMAL HIGH (ref 78.0–100.0)
Monocytes Absolute: 1 10*3/uL (ref 0.1–1.0)
Monocytes Relative: 11.9 % (ref 3.0–12.0)
Neutro Abs: 6.5 10*3/uL (ref 1.4–7.7)
Neutrophils Relative %: 74.1 % (ref 43.0–77.0)
Platelets: 144 10*3/uL — ABNORMAL LOW (ref 150.0–400.0)
RBC: 3.67 Mil/uL — ABNORMAL LOW (ref 4.22–5.81)
RDW: 15.8 % — ABNORMAL HIGH (ref 11.5–15.5)
WBC: 8.8 10*3/uL (ref 4.0–10.5)

## 2022-11-04 LAB — BASIC METABOLIC PANEL
BUN: 18 mg/dL (ref 6–23)
CO2: 29 mEq/L (ref 19–32)
Calcium: 8.6 mg/dL (ref 8.4–10.5)
Chloride: 104 mEq/L (ref 96–112)
Creatinine, Ser: 1.07 mg/dL (ref 0.40–1.50)
GFR: 64.07 mL/min (ref >60.00)
Glucose, Bld: 92 mg/dL (ref 70–99)
Potassium: 4.4 mEq/L (ref 3.5–5.1)
Sodium: 139 mEq/L (ref 135–145)

## 2022-11-05 LAB — IRON,TIBC AND FERRITIN PANEL
%SAT: 16 % (calc) — ABNORMAL LOW (ref 20–48)
Ferritin: 124 ng/mL (ref 24–380)
Iron: 41 ug/dL — ABNORMAL LOW (ref 50–180)
TIBC: 260 mcg/dL (calc) (ref 250–425)

## 2022-11-07 ENCOUNTER — Other Ambulatory Visit: Payer: Self-pay | Admitting: Family Medicine

## 2022-11-08 ENCOUNTER — Ambulatory Visit (INDEPENDENT_AMBULATORY_CARE_PROVIDER_SITE_OTHER): Payer: Medicare Other

## 2022-11-08 ENCOUNTER — Telehealth: Payer: Self-pay | Admitting: Internal Medicine

## 2022-11-08 VITALS — BP 144/62 | HR 51

## 2022-11-08 DIAGNOSIS — Z7901 Long term (current) use of anticoagulants: Secondary | ICD-10-CM

## 2022-11-08 LAB — POCT INR: INR: 1.4 — AB (ref 2.0–3.0)

## 2022-11-08 NOTE — Patient Instructions (Addendum)
Pre visit review using our clinic review tool, if applicable. No additional management support is needed unless otherwise documented below in the visit note.  Please check pill box to assure correct dosing for warfarin. Should have been taking 1 tablet daily except take 1/2 tablet on Mondays, Wednesdays, and Fridays. Call Dr. [name] of Dr. Caryl Comes in cardiology to find out correct dosing for amiodarone. I will also send cardiology a message requesting clarification on amiodarone dosing.  Number to the coumadin clinic is [phone].   Change weekly dose to take 1 tablet daily except take 1/2 tablet on Mondays and Fridays. Recheck in 2 weeks.

## 2022-11-08 NOTE — Telephone Encounter (Signed)
Pt c/o medication issue:  1. Name of Medication:   amiodarone (PACERONE) 200 MG tablet   2. How are you currently taking this medication (dosage and times per day)?  No sure   3. Are you having a reaction (difficulty breathing--STAT)?   No  4. What is your medication issue?   Sister-in-law stated they would like to confirm the patient's dosage for this medication because they believe the patient has not been taking the medication as prescribed.

## 2022-11-08 NOTE — Progress Notes (Addendum)
Increase dose today to take 1 1/2 tablets and increase dose tomorrow to take 1 tablet and the continue to take 1 tablet daily except take 1/2 tablet on Mondays, Wednesday, and Friday. Recheck in 2 weeks.  Please check pill box to assure correct dosing for warfarin. Should have been taking 1 tablet daily except take 1/2 tablet on Mondays, Wednesdays, and Fridays. Call Dr. [name] of Dr. Caryl Comes in cardiology to find out correct dosing for amiodarone. I will also send cardiology a message requesting clarification on amiodarone dosing.  Number to the coumadin clinic is [phone].   Sent msg to cardiologists, Dr. Caryl Comes and Dr. [name]. Pt has seen both physicians.   Pt and Letta Median, sister in law, returned call to advised on dosing pt is taking of warfarin and amiodarone.  Pt's pill box only has doses for both medications for tomorrow and the next day. The warfarin is correct in those two days and there is one amiodarone tablet in each day. They are unsure if he had been taking it on the weekends or not but pt reports he thinks he has only been taking M-F. They left a msg with the cardiology clinic for clarification. Advised this nurse also sent them a msg and would f/u with them tomorrow concerning dosing of warfarin going forward. Pt and Letta Median verbalized understanding.

## 2022-11-09 MED ORDER — AMIODARONE HCL 200 MG PO TABS: ORAL_TABLET | ORAL | 3 refills | Status: DC

## 2022-11-09 NOTE — Telephone Encounter (Signed)
Returned PC to pt's sister in Sports coach, Letta Median and advised she is not on pt's DPR to discuss pt's information with.  She states she is trying to confirm how pt is to be taking Amiodarone as she helps pt with medications.  Pt is not currently with her.  Advised to have pt refer to his paperwork given to pt at his last OV with Dr Caryl Comes for confirmation as to how ne should be taking Amiodarone.  Advised pt may call [phone] for any further information.  Pt's sister in law will have pt contact office if anything further is needed.

## 2022-11-09 NOTE — Progress Notes (Unsigned)
Per cardiology telephone encounter today, pt is to follow instructions for amiodarone given at last cardiology OV which he was advised to take 1 tablet M-F.  This decrease in dose has affected warfarin dosing. Will make a change to dosing for long term.   Pt was instructed to increase dose yesterday to 1 1/2 tablets and increase dose today to 1 tablet. He was then told to continue normal dosing until further information was obtained concerning amiodarone.  Weekly dose change due to decrease in amiodarone dosing started at the beginning of Nov. Change weekly dose to take 1 tablet daily except take 1/2 tablet on Mondays and Fridays. Recheck in 2 weeks.   LVM for pt to return call.  LVM for pt but he did not return call. Contacted pt's niece, Rodena Piety,, contact in chart and pt was with her. Gave pt and Rodena Piety instructions for warfarin and verified dosing for amiodarone. Pt and Rodena Piety verbalized understanding.

## 2022-11-14 ENCOUNTER — Other Ambulatory Visit: Payer: Self-pay | Admitting: Cardiology

## 2022-11-14 NOTE — Progress Notes (Signed)
[name], MD  [name], RN; [name], MD Hi. I reviewed Dr Olin Pia notes. Dose should be amiodarone 200 mg M-F (5 days/week). Thx!

## 2022-11-21 ENCOUNTER — Ambulatory Visit: Payer: Medicare Other | Admitting: Cardiovascular Disease

## 2022-11-22 ENCOUNTER — Ambulatory Visit (INDEPENDENT_AMBULATORY_CARE_PROVIDER_SITE_OTHER): Payer: Medicare Other

## 2022-11-22 ENCOUNTER — Telehealth: Payer: Medicare Other

## 2022-11-22 ENCOUNTER — Telehealth: Payer: Self-pay

## 2022-11-22 VITALS — BP 122/58 | HR 55

## 2022-11-22 DIAGNOSIS — Z7901 Long term (current) use of anticoagulants: Secondary | ICD-10-CM

## 2022-11-22 LAB — POCT INR: INR: 2.4 (ref 2.0–3.0)

## 2022-11-22 NOTE — Patient Instructions (Addendum)
Pre visit review using our clinic review tool, if applicable. No additional management support is needed unless otherwise documented below in the visit note.  Continue 1 tablet daily except take 1/2 tablet on Mondays and Fridays. Recheck in 6 weeks.

## 2022-11-22 NOTE — Progress Notes (Signed)
Continue 1 tablet daily except take 1/2 tablet on Mondays and Fridays. Recheck in 6 weeks.

## 2022-11-22 NOTE — Telephone Encounter (Signed)
Spoke with pt and advised of normal lab result with exception of mile elevation of alkaline phosphatase per Dr Caryl Comes.  Pt will need to follow up with PCP regarding this result.  Pt verbalizes understanding and agrees with current plan.

## 2022-11-22 NOTE — Telephone Encounter (Signed)
-----  Message from [name], MD sent at 10/31/2022  1:09 PM EST ----- Please Inform Patient  Labs are normal x mild elevation in alk Phos   he should follwoup about this with his PCP   Thanks

## 2022-12-02 ENCOUNTER — Ambulatory Visit: Payer: Medicare Other | Attending: Physician Assistant | Admitting: Cardiovascular Disease

## 2022-12-02 ENCOUNTER — Encounter: Payer: Self-pay | Admitting: Cardiovascular Disease

## 2022-12-02 VITALS — BP 128/54 | HR 60 | Ht 61.0 in | Wt 121.0 lb

## 2022-12-02 DIAGNOSIS — I4891 Unspecified atrial fibrillation: Secondary | ICD-10-CM | POA: Diagnosis not present

## 2022-12-02 DIAGNOSIS — I2510 Atherosclerotic heart disease of native coronary artery without angina pectoris: Secondary | ICD-10-CM

## 2022-12-02 DIAGNOSIS — I5022 Chronic systolic (congestive) heart failure: Secondary | ICD-10-CM

## 2022-12-02 NOTE — Patient Instructions (Addendum)
Medication Instructions:  Your physician recommends that you continue on your current medications as directed. Please refer to the Current Medication list given to you today.  *If you need a refill on your cardiac medications before your next appointment, please call your pharmacy*  Lab Work: Your physician recommends that you return for lab work in: 6 months for CMET, CBC, TSH, and Lipid Panel  If you have labs (blood work) drawn today and your tests are completely normal, you will receive your results only by: MyChart Message (if you have MyChart) OR A paper copy in the mail If you have any lab test that is abnormal or we need to change your treatment, we will call you to review the results.  Testing/Procedures: None ordered today.  Follow-Up: At [facility], you and your health needs are our priority.  As part of our continuing mission to provide you with exceptional heart care, we have created designated Provider Care Teams.  These Care Teams include your primary Cardiologist (physician) and Advanced Practice Providers (APPs -  Physician Assistants and Nurse Practitioners) who all work together to provide you with the care you need, when you need it.  We recommend signing up for the patient portal called "MyChart".  Sign up information is provided on this After Visit Summary.  MyChart is used to connect with patients for Virtual Visits (Telemedicine).  Patients are able to view lab/test results, encounter notes, upcoming appointments, etc.  Non-urgent messages can be sent to your provider as well.   To learn more about what you can do with MyChart, go to [email or web address].    Your next appointment:   6 month(s)  The format for your next appointment:   In Person  Provider:   [name], MD     Important Information About Sugar

## 2022-12-02 NOTE — Progress Notes (Signed)
Cardiology Office Note:    Date:  12/02/2022   ID:  Jesus [name], DOB [birth year], MRN [id number]  PCP:  [name], MD   [facility] Cardiologist:  [name], MD Electrophysiologist:  [name], MD     Referring MD: [name], MD   Chief Complaint  Patient presents with   Coronary Artery Disease    History of Present Illness:    Jesus [name] is a 81 y.o. male with a hx of coronary artery disease status post remote CABG, persistent atrial fibrillation anticoagulated with warfarin, severe aortic stenosis status post TAVR, chronic systolic heart failure, hypertension, and mixed hyperlipidemia. The patient has a history of remote anterior MI in 1980. He underwent multivessel CABG in 1997. He has had an LVEF around 40 to 45% with inferior wall scar. The patient had a non-ST elevation infarct in March 2023 and was found to have severe stenosis in the saphenous vein graft to PDA, treated with a drug-eluting stent. He experienced nonsustained VT and was treated with IV amiodarone and beta-blockade. He was readmitted with acute on chronic systolic heart failure and medications were adjusted.   The patient is here with his sister-in-law today.  He has been doing well.  He goes to [facility] and exercises there 5 days/week.  He is not having any exertional symptoms at the present time.  He denies chest pain, chest pressure, or shortness of breath.  He has mild leg swelling but no recent change in this.  No orthopnea, PND, or heart palpitations.  Past Medical History:  Diagnosis Date   Acute myocardial infarction of inferior wall ([place]) 1980   Aortic stenosis    mild with a mean aortic valve gradient of 12 mmHg   Atrial fibrillation (HCC)    holding sinus rhythm on Amiodarone   CAD (coronary artery disease)    a. S/P Ant MI 1980;  b. 1997 S/P CABG x 8 (LIMA to diag-LAD, SVG to OM1-OM2-OM3, SVG to [facility] - Dr [name]);  c. 01/2015 Cath:  3VD, 8/8 patent grafts.   Cardiomyopathy    Dysrhythmia    Esophageal reflux    GERD (gastroesophageal reflux disease)    Headache    History of colonoscopy    History of transesophageal echocardiography (TEE) for monitoring    Other and unspecified hyperlipidemia    Paroxysmal atrial fibrillation ([place]) 02/20/2022   S/P angioplasty with stent 02/17/22 DES to proximal VG to PDA 02/20/2022   S/P TAVR (transcatheter aortic valve replacement)    a. 03/2015 26 mm Edwards Sapien 3 transcatheter heart valve placed via open left transfemoral approach.   Severe aortic stenosis 08/10/2012   Skin lesions, generalized    facial which [name] represent actinic keratoses and possible photosensitivity from Amiodarone   Unspecified essential hypertension     Past Surgical History:  Procedure Laterality Date   CARDIAC CATHETERIZATION     CARDIOVERSION  11/18/2006   Dr. [name]   CATARACT EXTRACTION W/ INTRAOCULAR LENS IMPLANT  April '13  (Dr. [name])   left eye only   CHOLECYSTECTOMY     CORONARY ARTERY BYPASS GRAFT  02/09/1996   LIMA to diag-LAD, SVG to OM1-OM2-OM3, SVG to [facility]   CORONARY STENT INTERVENTION N/A 02/17/2022   Procedure: CORONARY STENT INTERVENTION;  Surgeon: Burnell Blanks, MD;  Location: [place] CV LAB;  Service: Cardiovascular;  Laterality: N/A;   CORONARY/GRAFT ANGIOGRAPHY N/A 02/17/2022   Procedure: CORONARY/GRAFT ANGIOGRAPHY;  Surgeon: Burnell Blanks, MD;  Location: [place] CV LAB;  Service: Cardiovascular;  Laterality: N/A;   EYE SURGERY     LEFT AND RIGHT HEART CATHETERIZATION WITH CORONARY/GRAFT ANGIOGRAM N/A 01/26/2015   Procedure: LEFT AND RIGHT HEART CATHETERIZATION WITH [name];  Surgeon: [name], MD;  Location: [facility] CATH LAB;  Service: Cardiovascular;  Laterality: N/A;   TEE WITHOUT CARDIOVERSION N/A 03/10/2015   Procedure: TRANSESOPHAGEAL ECHOCARDIOGRAM (TEE);  Surgeon: [name], MD;  Location: [place];  Service: Open  Heart Surgery;  Laterality: N/A;   TONSILLECTOMY     TRANSCATHETER AORTIC VALVE REPLACEMENT, TRANSFEMORAL N/A 03/10/2015   Procedure: TRANSCATHETER AORTIC VALVE REPLACEMENT, TRANSFEMORAL;  Surgeon: [name], MD;  Location: [facility];  Service: Open Heart Surgery;  Laterality: N/A;    Current Medications: Current Meds  Medication Sig   acetaminophen (TYLENOL) 325 MG tablet Take 1-2 tablets (325-650 mg total) by mouth every 4 (four) hours as needed for mild pain.   albuterol (VENTOLIN HFA) 108 (90 Base) MCG/ACT inhaler INHALE 2 PUFFS BY MOUTH EVERY 4 HOURS ASNEEDED FOR WHEEZING OR SHORTNESS OF BREATH.   amiodarone (PACERONE) 200 MG tablet Take 1 tablet by mouth daily Monday - Friday.   Ascorbic Acid (VITAMIN C) 1000 MG tablet Take 1,000 mg by mouth daily.   atorvastatin (LIPITOR) 80 MG tablet TAKE 1 TABLET BY MOUTH EVERY DAY   clopidogrel (PLAVIX) 75 MG tablet Take 1 tablet (75 mg total) by mouth daily with breakfast.   diclofenac Sodium (VOLTAREN) 1 % GEL Apply 4 g topically 4 (four) times daily.   ezetimibe (ZETIA) 10 MG tablet Take 1 tablet (10 mg total) by mouth daily.   furosemide (LASIX) 20 MG tablet Take 1 tablet (20 mg total) by mouth daily as needed. For swelling in the legs, shortness of breath or if your weight goes up by 2 lbs overnight. Call cardiology for input.   JARDIANCE 10 MG TABS tablet TAKE 1 TABLET BY MOUTH EVERY DAY   loratadine (CLARITIN) 10 MG tablet Take 10 mg by mouth daily as needed for allergies.   melatonin 5 MG TABS Take 1 tablet (5 mg total) by mouth at bedtime as needed (sleep).   Multiple Vitamins-Minerals (MULTIVITAMIN,TX-MINERALS) tablet Take 1 tablet by mouth daily.   nitroGLYCERIN (NITROSTAT) 0.4 MG SL tablet Place 1 tablet (0.4 mg total) under the tongue every 5 (five) minutes x 3 doses as needed for chest pain.   valsartan (DIOVAN) 80 MG tablet Take 1.5 tablets (120 mg total) by mouth daily.   warfarin (COUMADIN) 4 MG tablet TAKE 1 TABLET BY MOUTH DAILY  EXCEPT TAKE 1/2 TABLET ON MONDAYS, WEDNESDAYS AND FRIDAYS OR AS DIRECTED BY ANTICOAGULATION CLINIC     Allergies:   Amlodipine, Codeine, and Iodine   Social History   Socioeconomic History   Marital status: Married    Spouse name: Not on file   Number of children: Not on file   Years of education: Not on file   Highest education level: Not on file  Occupational History   Not on file  Tobacco Use   Smoking status: Never   Smokeless tobacco: Never   Tobacco comments:    Does not smoke.  Vaping Use   Vaping Use: Never used  Substance and Sexual Activity   Alcohol use: No    Alcohol/week: 0.0 standard drinks of alcohol   Drug use: No   Sexual activity: Not Currently  Other Topics Concern   Not on file  Social History Narrative   HSG. [employer] -  [employer] 6 years. Married - '69 - 1 year/divorced. '76 - . No children. Work - mfg/textiles - Designer, multimedia; currently works doing maintenance. [employer] - they have discussed this. Provided packet august '13.                Social Determinants of Health   Financial Resource Strain: Low Risk  (07/28/2022)   Overall Financial Resource Strain (CARDIA)    Difficulty of Paying Living Expenses: Not hard at all  Food Insecurity: No Food Insecurity (07/28/2022)   Hunger Vital Sign    Worried About Running Out of Food in the Last Year: Never true    Ran Out of Food in the Last Year: Never true  Transportation Needs: No Transportation Needs (07/28/2022)   PRAPARE - Hydrologist (Medical): No    Lack of Transportation (Non-Medical): No  Physical Activity: Sufficiently Active (07/28/2022)   Exercise Vital Sign    Days of Exercise per Week: 3 days    Minutes of Exercise per Session: 60 min  Stress: No Stress Concern Present (07/28/2022)   [place]    Feeling of Stress : Not at all  Social Connections: Moderately Integrated (07/28/2022)   Social  Connection and Isolation Panel [NHANES]    Frequency of Communication with Friends and Family: More than three times a week    Frequency of Social Gatherings with Friends and Family: More than three times a week    Attends Religious Services: More than 4 times per year    Active Member of [employer] or Organizations: Yes    Attends Archivist Meetings: More than 4 times per year    Marital Status: Widowed     Family History: The patient's family history includes Atrial fibrillation in his brother; Crohn's disease in his mother; Heart attack (age of onset: 60) in his father; Heart disease in his father and paternal uncle; Heart failure in his father; Prostate cancer in his paternal uncle; Stroke (age of onset: 22) in his mother. There is no history of Colon cancer.  ROS:   Please see the history of present illness.    All other systems reviewed and are negative.  EKGs/Labs/Other Studies Reviewed:    The following studies were reviewed today: Echo 3/15/20223:  1. Left ventricular ejection fraction, by estimation, is 35 to 40%. The  left ventricle has moderately decreased function. The left ventricle  demonstrates regional wall motion abnormalities (see scoring  diagram/findings for description).  Inferior/inferolateral akinesis. Left ventricular diastolic parameters are  consistent with Grade II diastolic dysfunction (pseudonormalization).  Elevated left atrial pressure.   2. Right ventricular systolic function is normal. The right ventricular  size is mildly enlarged.   3. Left atrial size was severely dilated.   4. The mitral valve is normal in structure. Trivial mitral valve  regurgitation. No evidence of mitral stenosis.   5. The aortic valve has been repaired/replaced. Aortic valve  regurgitation is mild. There is a 26 mm Edwards Sapien prosthetic (TAVR)  valve present in the aortic position. Procedure Date: 03/10/15. Echo  findings are consistent with perivalvular leak of   the aortic prosthesis. Vmax 2.9 m/s, MG 34mHg (unchanged from prior  echo), EOA 1.4 cm^2, DI 0.46   6. The inferior vena cava is normal in size with greater than 50%  respiratory variability, suggesting right atrial pressure of 3 mmHg.   EKG:  EKG is not ordered today.  Recent Labs: 02/28/2022: Magnesium 1.9 03/21/2022: B Natriuretic Peptide 372.2 10/19/2022: ALT 35; TSH 3.000 11/04/2022: BUN 18; Creatinine, Ser 1.07; Hemoglobin 12.4; Platelets 144.0; Potassium 4.4; Sodium 139  Recent Lipid Panel    Component Value Date/Time   CHOL 131 09/01/2022 0726   CHOL 141 06/02/2021 0732   TRIG 83.0 09/01/2022 0726   TRIG 67 11/20/2006 0748   HDL 50.00 09/01/2022 0726   HDL 51 06/02/2021 0732   CHOLHDL 3 09/01/2022 0726   VLDL 16.6 09/01/2022 0726   LDLCALC 65 09/01/2022 0726   LDLCALC 71 06/02/2021 0732     Risk Assessment/Calculations:    CHA2DS2-VASc Score = 5   This indicates a 7.2% annual risk of stroke. The patient's score is based upon: CHF History: 1 HTN History: 1 Diabetes History: 0 Stroke History: 0 Vascular Disease History: 1 Age Score: 2 Gender Score: 0               Physical Exam:    VS:  BP (!) 128/54   Pulse 60   Ht '5\' 1"'$  (1.549 m)   Wt 121 lb (54.9 kg)   SpO2 97%   BMI 22.86 kg/m     Wt Readings from Last 3 Encounters:  12/02/22 121 lb (54.9 kg)  11/01/22 122 lb 14.4 oz (55.7 kg)  10/25/22 123 lb (55.8 kg)     GEN:  Well nourished, well developed in no acute distress HEENT: Normal NECK: No JVD; No carotid bruits LYMPHATICS: No lymphadenopathy CARDIAC: RRR, 2/6 systolic murmur at the right upper sternal border with a 2/6 diastolic decrescendo murmur at the left lower sternal border RESPIRATORY:  Clear to auscultation without rales, wheezing or rhonchi  ABDOMEN: Soft, non-tender, non-distended MUSCULOSKELETAL:  No edema; No deformity  SKIN: Warm and dry NEUROLOGIC:  Alert and oriented x 3 PSYCHIATRIC:  Normal affect   ASSESSMENT:     1. Coronary artery disease involving native heart without angina pectoris, unspecified vessel or lesion type   2. Atrial fibrillation, unspecified type ([place])   3. Chronic systolic CHF (congestive heart failure) (HCC)    PLAN:    In order of problems listed above:  The patient is stable after undergoing vein graft PCI earlier this year.  He remains on antiplatelet therapy with clopidogrel and has no bleeding problems at this time.  He is chronically anticoagulated with warfarin for atrial fibrillation.  Will continue his current management.  Will see him back in 6 months with a CBC, metabolic panel, and lipid panel. Maintaining sinus rhythm on amiodarone.  Will continue the same.  Surveillance TSH, LFTs in 6 months.  He sees an ophthalmologist once a year. Stable on Jardiance and valsartan.  Treated with amiodarone, not a candidate for beta-blocker due to bradycardia.  Continue same.     Medication Adjustments/Labs and Tests Ordered: Current medicines are reviewed at length with the patient today.  Concerns regarding medicines are outlined above.  Orders Placed This Encounter  Procedures   CBC with Differential/Platelet   Comprehensive metabolic panel   TSH   Lipid panel   No orders of the defined types were placed in this encounter.   Patient Instructions  Medication Instructions:  Your physician recommends that you continue on your current medications as directed. Please refer to the Current Medication list given to you today.  *If you need a refill on your cardiac medications before your next appointment, please call your pharmacy*  Lab Work: Your physician recommends that you return for lab work in:  6 months for CMET, CBC, TSH, and Lipid Panel  If you have labs (blood work) drawn today and your tests are completely normal, you will receive your results only by: [place] (if you have MyChart) OR A paper copy in the mail If you have any lab test that is abnormal or we  need to change your treatment, we will call you to review the results.  Testing/Procedures: None ordered today.  Follow-Up: At [facility], you and your health needs are our priority.  As part of our continuing mission to provide you with exceptional heart care, we have created designated Provider Care Teams.  These Care Teams include your primary Cardiologist (physician) and Advanced Practice Providers (APPs -  Physician Assistants and Nurse Practitioners) who all work together to provide you with the care you need, when you need it.  We recommend signing up for the patient portal called "MyChart".  Sign up information is provided on this After Visit Summary.  MyChart is used to connect with patients for Virtual Visits (Telemedicine).  Patients are able to view lab/test results, encounter notes, upcoming appointments, etc.  Non-urgent messages can be sent to your provider as well.   To learn more about what you can do with MyChart, go to [email or web address].    Your next appointment:   6 month(s)  The format for your next appointment:   In Person  Provider:   [name], MD     Important Information About Sugar         Signed, [name], MD  12/02/2022 5:22 PM    [place]

## 2022-12-19 ENCOUNTER — Encounter: Payer: Self-pay | Admitting: Family Medicine

## 2022-12-19 ENCOUNTER — Ambulatory Visit (INDEPENDENT_AMBULATORY_CARE_PROVIDER_SITE_OTHER): Payer: Medicare Other | Admitting: Family Medicine

## 2022-12-19 VITALS — BP 138/62 | HR 55 | Temp 97.5°F | Ht 61.0 in | Wt 124.8 lb

## 2022-12-19 DIAGNOSIS — I2510 Atherosclerotic heart disease of native coronary artery without angina pectoris: Secondary | ICD-10-CM | POA: Diagnosis not present

## 2022-12-19 DIAGNOSIS — I5022 Chronic systolic (congestive) heart failure: Secondary | ICD-10-CM | POA: Diagnosis not present

## 2022-12-19 DIAGNOSIS — I480 Paroxysmal atrial fibrillation: Secondary | ICD-10-CM

## 2022-12-19 DIAGNOSIS — D508 Other iron deficiency anemias: Secondary | ICD-10-CM | POA: Diagnosis not present

## 2022-12-19 NOTE — Progress Notes (Signed)
Established Patient Office Visit  Subjective   Patient ID: Jesus [name], male    DOB: March 17, 1941  Age: 82 y.o. MRN: [id number]  Chief Complaint  Patient presents with   Follow-up    HPI   [name] is seen for medical follow-up.  He has history of CAD with remote CABG along with atrial fibrillation on chronic Coumadin, history of severe aortic stenosis with prior TAVR, chronic systolic heart failure, hypertension, hyperlipidemia.  He had CABG 1997.  He then had an non-ST elevation MI in March 2023 with severe stenosis saphenous vein graft to PDA treated with drug-eluting stent.  He had nonsustained V. tach treated with IV amiodarone and beta-blocker.  Continues to do fairly well at this time.  Home weights have been stable.  He has some mild late day leg edema.  Denies any dyspnea.  He is going to the gym 30 minutes 5 days/week.  He has some chronic mild anemia.  Had recently presented with some bright red blood per rectum and has not seen any since that visit.  His ferritin was normal with slightly low iron.  He tried some iron replacement but had constipation.  He thinks he is taking a multivitamin with iron.  Appetite and weight are stable.  Past Medical History:  Diagnosis Date   Acute myocardial infarction of inferior wall ([place]) 1980   Aortic stenosis    mild with a mean aortic valve gradient of 12 mmHg   Atrial fibrillation (HCC)    holding sinus rhythm on Amiodarone   CAD (coronary artery disease)    a. S/P Ant MI 1980;  b. 1997 S/P CABG x 8 (LIMA to diag-LAD, SVG to OM1-OM2-OM3, SVG to [facility] - Dr [name]);  c. 01/2015 Cath: 3VD, 8/8 patent grafts.   Cardiomyopathy    Dysrhythmia    Esophageal reflux    GERD (gastroesophageal reflux disease)    Headache    History of colonoscopy    History of transesophageal echocardiography (TEE) for monitoring    Other and unspecified hyperlipidemia    Paroxysmal atrial fibrillation ([place]) 02/20/2022   S/P angioplasty with stent 02/17/22  DES to proximal VG to PDA 02/20/2022   S/P TAVR (transcatheter aortic valve replacement)    a. 03/2015 26 mm Edwards Sapien 3 transcatheter heart valve placed via open left transfemoral approach.   Severe aortic stenosis 08/10/2012   Skin lesions, generalized    facial which may represent actinic keratoses and possible photosensitivity from Amiodarone   Unspecified essential hypertension    Past Surgical History:  Procedure Laterality Date   CARDIAC CATHETERIZATION     CARDIOVERSION  11/18/2006   Dr. [name]   CATARACT EXTRACTION W/ INTRAOCULAR LENS IMPLANT  April '13  (Dr. [name])   left eye only   CHOLECYSTECTOMY     CORONARY ARTERY BYPASS GRAFT  02/09/1996   LIMA to diag-LAD, SVG to OM1-OM2-OM3, SVG to [facility]   CORONARY STENT INTERVENTION N/A 02/17/2022   Procedure: CORONARY STENT INTERVENTION;  Surgeon: Burnell Blanks, MD;  Location: [place] CV LAB;  Service: Cardiovascular;  Laterality: N/A;   CORONARY/GRAFT ANGIOGRAPHY N/A 02/17/2022   Procedure: CORONARY/GRAFT ANGIOGRAPHY;  Surgeon: Burnell Blanks, MD;  Location: [place] CV LAB;  Service: Cardiovascular;  Laterality: N/A;   EYE SURGERY     LEFT AND RIGHT HEART CATHETERIZATION WITH CORONARY/GRAFT ANGIOGRAM N/A 01/26/2015   Procedure: LEFT AND RIGHT HEART CATHETERIZATION WITH [name];  Surgeon: [name], MD;  Location: [facility] CATH LAB;  Service: Cardiovascular;  Laterality: N/A;   TEE WITHOUT CARDIOVERSION N/A 03/10/2015   Procedure: TRANSESOPHAGEAL ECHOCARDIOGRAM (TEE);  Surgeon: [name], MD;  Location: [place];  Service: Open Heart Surgery;  Laterality: N/A;   TONSILLECTOMY     TRANSCATHETER AORTIC VALVE REPLACEMENT, TRANSFEMORAL N/A 03/10/2015   Procedure: TRANSCATHETER AORTIC VALVE REPLACEMENT, TRANSFEMORAL;  Surgeon: [name], MD;  Location: [place];  Service: Open Heart Surgery;  Laterality: N/A;    reports that he has never smoked. He has never used smokeless tobacco. He  reports that he does not drink alcohol and does not use drugs. family history includes Atrial fibrillation in his brother; Crohn's disease in his mother; Heart attack (age of onset: 17) in his father; Heart disease in his father and paternal uncle; Heart failure in his father; Prostate cancer in his paternal uncle; Stroke (age of onset: 49) in his mother. Allergies  Allergen Reactions   Amlodipine Other (See Comments)    Swelling?   Codeine Other (See Comments)    Pt does not remember   Iodine Swelling    Review of Systems  Constitutional:  Negative for malaise/fatigue.  Eyes:  Negative for blurred vision.  Respiratory:  Negative for shortness of breath.   Cardiovascular:  Negative for chest pain.  Neurological:  Negative for dizziness, weakness and headaches.      Objective:     BP 138/62 (BP Location: Left Arm, Cuff Size: Normal)   Pulse (!) 55   Temp (!) 97.5 F (36.4 C) (Oral)   Ht '5\' 1"'$  (1.549 m)   Wt 124 lb 12.8 oz (56.6 kg)   SpO2 99%   BMI 23.58 kg/m  BP Readings from Last 3 Encounters:  12/19/22 138/62  12/02/22 (!) 128/54  11/22/22 (!) 122/58   Wt Readings from Last 3 Encounters:  12/19/22 124 lb 12.8 oz (56.6 kg)  12/02/22 121 lb (54.9 kg)  11/01/22 122 lb 14.4 oz (55.7 kg)      Physical Exam Vitals reviewed.  Constitutional:      Appearance: He is well-developed.  HENT:     Right Ear: External ear normal.     Left Ear: External ear normal.  Eyes:     Pupils: Pupils are equal, round, and reactive to light.  Neck:     Thyroid: No thyromegaly.  Cardiovascular:     Rate and Rhythm: Normal rate and regular rhythm.     Heart sounds: Murmur heard.  Pulmonary:     Effort: Pulmonary effort is normal. No respiratory distress.     Breath sounds: Normal breath sounds. No wheezing or rales.  Musculoskeletal:     Cervical back: Neck supple.     Comments: Trace pitting edema lower legs bilaterally  Neurological:     Mental Status: He is alert and oriented  to person, place, and time.      No results found for any visits on 12/19/22.  Last CBC Lab Results  Component Value Date   WBC 8.8 11/04/2022   HGB 12.4 (L) 11/04/2022   HCT 37.4 (L) 11/04/2022   MCV 102.0 (H) 11/04/2022   MCH 32.5 03/29/2022   RDW 15.8 (H) 11/04/2022   PLT 144.0 (L) 16/09/9603   Last metabolic panel Lab Results  Component Value Date   GLUCOSE 92 11/04/2022   NA 139 11/04/2022   K 4.4 11/04/2022   CL 104 11/04/2022   CO2 29 11/04/2022   BUN 18 11/04/2022   CREATININE 1.07 11/04/2022   EGFR 61 10/19/2022  CALCIUM 8.6 11/04/2022   PROT 6.3 10/19/2022   ALBUMIN 3.7 10/19/2022   LABGLOB 2.6 10/19/2022   AGRATIO 1.4 10/19/2022   BILITOT 0.4 10/19/2022   ALKPHOS 132 (H) 10/19/2022   AST 39 10/19/2022   ALT 35 10/19/2022   ANIONGAP 8 03/21/2022   Last lipids Lab Results  Component Value Date   CHOL 131 09/01/2022   HDL 50.00 09/01/2022   LDLCALC 65 09/01/2022   TRIG 83.0 09/01/2022   CHOLHDL 3 09/01/2022   Last thyroid functions Lab Results  Component Value Date   TSH 3.000 10/19/2022   T4TOTAL 8.5 10/19/2009      The ASCVD Risk score (Arnett DK, et al., 2019) failed to calculate for the following reasons:   The 2019 ASCVD risk score is only valid for ages 55 to 38   The patient has a prior MI or stroke diagnosis    Assessment & Plan:   #1 history of CAD with remote CABG and stent in PDA following non-ST elevation MI 3/23.  Doing well at this time.  History of nonsustained V. tach.  On amiodarone.  Followed closely by cardiology.  #2 history of systolic heart failure.  Home weights been stable.  No dyspnea with daily activities.  Continue daily weights.  Continue to watch sodium intake carefully.  Remains on Jardiance, [place]  #3 recent bright red blood per rectum.  None since last visit.  Had normal ferritin with slightly low serum iron.  Watch closely for any recurrent bloody stools.  Continue multivitamin with iron.  Consider CBC at  37-monthfollow-up  #4 history of atrial fibrillation on chronic Coumadin.  Most recent INR 2.4 on 11-22-2022  BCarolann Littler MD

## 2022-12-19 NOTE — Patient Instructions (Signed)
Continue to monitor daily weight and be in touch for > 3 pound weight gain in one day or > 5 pounds in one week  Try to keep daily sodium intake < 2,500 mg  Keep exercising.

## 2023-01-02 ENCOUNTER — Other Ambulatory Visit: Payer: Self-pay

## 2023-01-02 DIAGNOSIS — Z7901 Long term (current) use of anticoagulants: Secondary | ICD-10-CM

## 2023-01-02 MED ORDER — WARFARIN SODIUM 4 MG PO TABS: ORAL_TABLET | ORAL | 2 refills | Status: DC

## 2023-01-02 NOTE — Telephone Encounter (Signed)
Pt is compliant with warfarin management and PCP apts. ?Sent in refill.  ?

## 2023-01-06 ENCOUNTER — Ambulatory Visit (INDEPENDENT_AMBULATORY_CARE_PROVIDER_SITE_OTHER): Payer: Medicare Other

## 2023-01-06 VITALS — BP 124/48 | HR 56

## 2023-01-06 DIAGNOSIS — Z7901 Long term (current) use of anticoagulants: Secondary | ICD-10-CM

## 2023-01-06 LAB — POCT INR: INR: 2.9 (ref 2.0–3.0)

## 2023-01-06 NOTE — Progress Notes (Addendum)
Continue 1 tablet daily except take 1/2 tablet on Mondays and Fridays. Recheck in 6 weeks.   Pt requested BP check at visit. BP today 124/48, HR 56  , O2sat   95%

## 2023-01-06 NOTE — Patient Instructions (Addendum)
Pre visit review using our clinic review tool, if applicable. No additional management support is needed unless otherwise documented below in the visit note.  Continue 1 tablet daily except take 1/2 tablet on Mondays and Fridays. Recheck in 6 weeks.

## 2023-01-16 ENCOUNTER — Ambulatory Visit (INDEPENDENT_AMBULATORY_CARE_PROVIDER_SITE_OTHER): Payer: Medicare Other | Admitting: Family Medicine

## 2023-01-16 VITALS — BP 152/60 | HR 55 | Temp 98.1°F | Ht 61.0 in | Wt 121.6 lb

## 2023-01-16 DIAGNOSIS — K921 Melena: Secondary | ICD-10-CM | POA: Diagnosis not present

## 2023-01-16 DIAGNOSIS — D509 Iron deficiency anemia, unspecified: Secondary | ICD-10-CM | POA: Diagnosis not present

## 2023-01-16 NOTE — Progress Notes (Signed)
Established Patient Office Visit  Subjective   Patient ID: Jesus [name], male    DOB: [birth year]  Age: 82 y.o. MRN: [id number]  Chief Complaint  Patient presents with   Rectal Bleeding    HPI   Jesus [name] is seen for recurrent episode of small-volume/trace blood in toilet recently with bowel movement.  He came in with similar complaint November.  Exam at that time revealed brown Hemoccult negative stool.  His labs were stable but did have some very mild iron deficiency.  He has slow release iron but has not been taking this.  He denies any pain with bowel movements.  No recent diarrhea or constipation.  Last colonoscopy 2017.  Does have history of diverticulosis but no diverticulosis bleed.  Does have past history of internal hemorrhoids.  Most recent hemoglobin 12.4.  He does take Coumadin and Plavix.  Most recent INR 2.9.  Appetite and weight stable.  No dizziness.  Still exercising 5 days/week at [facility].  He has multiple cardiac problems including history of atrial fibrillation, systolic heart failure, CAD, hypertension.  Denies any recent chest pains.  No increased dyspnea.  No increased peripheral edema.  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 S/P Ant MI 1980;  b. 1997 S/P CABG x 8 (LIMA to diag-LAD, SVG to OM1-OM2-OM3, SVG to [facility] - Dr [name]);  c. 01/2015 Cath: 3VD, 8/8 patent grafts.   Cardiomyopathy    Dysrhythmia    Esophageal reflux    GERD (gastroesophageal reflux disease)    Headache    History of colonoscopy    History of transesophageal echocardiography (TEE) for monitoring    Other and unspecified hyperlipidemia    Paroxysmal atrial fibrillation ([place]) 02/20/2022   S/P angioplasty with stent 02/17/22 DES to proximal VG to  PDA 02/20/2022   S/P TAVR (transcatheter aortic valve replacement)    a. 03/2015 26 mm Edwards Sapien 3 transcatheter heart valve placed via open left transfemoral approach.   Severe aortic stenosis 08/10/2012   Skin lesions, generalized    facial which may represent actinic keratoses and possible photosensitivity from Amiodarone   Unspecified essential hypertension    Past Surgical History:  Procedure Laterality Date   CARDIAC CATHETERIZATION     CARDIOVERSION  11/18/2006   Dr. [name]   CATARACT EXTRACTION W/ INTRAOCULAR LENS IMPLANT  April '13  (Dr. [name])   left eye only   CHOLECYSTECTOMY     CORONARY ARTERY BYPASS GRAFT  02/09/1996   LIMA to diag-LAD, SVG to OM1-OM2-OM3, SVG to [facility]   CORONARY STENT INTERVENTION N/A 02/17/2022   Procedure: CORONARY STENT INTERVENTION;  Surgeon: Burnell Blanks, MD;  Location: [facility];  Service: Cardiovascular;  Laterality: N/A;   CORONARY/GRAFT ANGIOGRAPHY N/A 02/17/2022   Procedure: CORONARY/GRAFT ANGIOGRAPHY;  Surgeon: Burnell Blanks, MD;  Location: [place] [facility];  Service: Cardiovascular;  Laterality: N/A;   EYE SURGERY     LEFT AND RIGHT HEART CATHETERIZATION WITH CORONARY/GRAFT ANGIOGRAM N/A 01/26/2015   Procedure: LEFT AND RIGHT HEART CATHETERIZATION WITH [name];  Surgeon: [name], MD;  Location: [facility] C[facility];  Service: Cardiovascular;  Laterality: N/A;   TEE WITHOUT CARDIOVERSION N/A 03/10/2015   Procedure: TRANSESOPHAGEAL  ECHOCARDIOGRAM (TEE);  Surgeon: [name], MD;  Location: [facility];  Service: Open Heart Surgery;  Laterality: N/A;   TONSILLECTOMY     TRANSCATHETER AORTIC VALVE REPLACEMENT, TRANSFEMORAL N/A 03/10/2015   Procedure: TRANSCATHETER AORTIC VALVE REPLACEMENT, TRANSFEMORAL;  Surgeon: [name], MD;  Location: [facility];  Service: Open Heart Surgery;  Laterality: N/A;    reports that he has never smoked. He has never used smokeless tobacco. He reports that he does  not drink alcohol and does not use drugs. family history includes Atrial fibrillation in his brother; Crohn's disease in his mother; Heart attack (age of onset: 84) in his father; Heart disease in his father and paternal uncle; Heart failure in his father; Prostate cancer in his paternal uncle; Stroke (age of onset: 51) in his mother. Allergies  Allergen Reactions   Amlodipine Other (See Comments)    Swelling?   Codeine Other (See Comments)    Pt does not remember   Iodine Swelling    Review of Systems  Constitutional:  Negative for chills, fever and weight loss.  Respiratory:  Negative for shortness of breath.   Cardiovascular:  Negative for chest pain.  Gastrointestinal:  Positive for blood in stool. Negative for abdominal pain, constipation, diarrhea and melena.  Neurological:  Negative for dizziness.      Objective:     BP (!) 152/60 (BP Location: Left Arm, Patient Position: Sitting, Cuff Size: Normal)   Pulse (!) 55   Temp 98.1 F (36.7 C) (Oral)   Ht 5' 1"$  (1.549 m)   Wt 121 lb 9.6 oz (55.2 kg)   SpO2 98%   BMI 22.98 kg/m  BP Readings from Last 3 Encounters:  01/16/23 (!) 152/60  01/06/23 (!) 124/48  12/19/22 138/62   Wt Readings from Last 3 Encounters:  01/16/23 121 lb 9.6 oz (55.2 kg)  12/19/22 124 lb 12.8 oz (56.6 kg)  12/02/22 121 lb (54.9 kg)      Physical Exam Vitals reviewed.  Constitutional:      General: He is not in acute distress.    Appearance: He is not toxic-appearing.  Cardiovascular:     Rate and Rhythm: Normal rate.  Pulmonary:     Effort: Pulmonary effort is normal.     Breath sounds: Normal breath sounds.  Genitourinary:    Comments: Rectal exam reveals no external hemorrhoids or visible anal fissure.  Digital exam no palpable mass.  He had small amount of brown stool in the rectal vault which was Hemoccult positive Musculoskeletal:     Right lower leg: No edema.     Left lower leg: No edema.  Neurological:     Mental Status: He is  alert.      No results found for any visits on 01/16/23.  Last CBC Lab Results  Component Value Date   WBC 8.8 11/04/2022   HGB 12.4 (L) 11/04/2022   HCT 37.4 (L) 11/04/2022   MCV 102.0 (H) 11/04/2022   MCH 32.5 03/29/2022   RDW 15.8 (H) 11/04/2022   PLT 144.0 (L) [id number]   Last metabolic panel Lab Results  Component Value Date   GLUCOSE 92 11/04/2022   NA 139 11/04/2022   K 4.4 11/04/2022   CL 104 11/04/2022   CO2 29 11/04/2022   BUN 18 11/04/2022   CREATININE 1.07 11/04/2022   EGFR 61 10/19/2022   CALCIUM 8.6 11/04/2022   PROT 6.3 10/19/2022   ALBUMIN 3.7 10/19/2022   LABGLOB 2.6 10/19/2022   AGRATIO 1.4 10/19/2022  BILITOT 0.4 10/19/2022   ALKPHOS 132 (H) 10/19/2022   AST 39 10/19/2022   ALT 35 10/19/2022   ANIONGAP 8 03/21/2022   Last lipids Lab Results  Component Value Date   CHOL 131 09/01/2022   HDL 50.00 09/01/2022   LDLCALC 65 09/01/2022   TRIG 83.0 09/01/2022   CHOLHDL 3 09/01/2022      The ASCVD Risk score (Arnett DK, et al., 2019) failed to calculate for the following reasons:   The 2019 ASCVD risk score is only valid for ages 32 to 30   The patient has a prior MI or stroke diagnosis    Assessment & Plan:   Hemoccult positive stool and gross hematochezia on a couple of occasions which was described as very small volume in a patient on Plavix and Coumadin.  Last colonoscopy 2017.  Does have history of internal hemorrhoids  -We discussed repeating labs with CBC and iron studies -Start slow iron 1 daily as tolerated -Patient is a poor candidate for further colonoscopies with his heart history and age. -He has very high CHA2DS2-VASc score and need to maintain Coumadin unless he has extensive bleeding issue.  At this point he is only describing trace blood in the toilet and recommend close observation with repeat studies as above   [name], MD

## 2023-01-17 LAB — CBC WITH DIFFERENTIAL/PLATELET
Basophils Absolute: 0.1 10*3/uL (ref 0.0–0.1)
Basophils Relative: 1.5 % (ref 0.0–3.0)
Eosinophils Absolute: 0.2 10*3/uL (ref 0.0–0.7)
Eosinophils Relative: 2.1 % (ref 0.0–5.0)
HCT: 41.1 % (ref 39.0–52.0)
Hemoglobin: 13.7 g/dL (ref 13.0–17.0)
Lymphocytes Relative: 15.7 % (ref 12.0–46.0)
Lymphs Abs: 1.1 10*3/uL (ref 0.7–4.0)
MCHC: 33.4 g/dL (ref 30.0–36.0)
MCV: 100.5 fl — ABNORMAL HIGH (ref 78.0–100.0)
Monocytes Absolute: 0.9 10*3/uL (ref 0.1–1.0)
Monocytes Relative: 12.1 % — ABNORMAL HIGH (ref 3.0–12.0)
Neutro Abs: 5 10*3/uL (ref 1.4–7.7)
Neutrophils Relative %: 68.6 % (ref 43.0–77.0)
Platelets: 132 10*3/uL — ABNORMAL LOW (ref 150.0–400.0)
RBC: 4.08 Mil/uL — ABNORMAL LOW (ref 4.22–5.81)
RDW: 13.7 % (ref 11.5–15.5)
WBC: 7.3 10*3/uL (ref 4.0–10.5)

## 2023-01-17 LAB — IRON,TIBC AND FERRITIN PANEL
%SAT: 21 % (calc) (ref 20–48)
Ferritin: 86 ng/mL (ref 24–380)
Iron: 60 ug/dL (ref 50–180)
TIBC: 289 mcg/dL (calc) (ref 250–425)

## 2023-01-17 NOTE — Progress Notes (Signed)
Care Management & Coordination Services Pharmacy Note  01/17/2023 Name:  Jesus [name] MRN:  [id number] DOB:  May 09, 1941  Summary: -Pt reports well-controlled BP at home -Updated med list for Valsartan 51m (pt still taking just 1 tab daily, never increased to 1.5 tabs daily-forgot)  Recommendations/Changes made from today's visit: -Continue medication therapy -Continue to check BP twice daily and keep a log   Follow up plan: BP review in 2 months Pharmacist visit in 4 months   Subjective: Jesus STUEBERis an 82y.o. year old male who is a primary patient of [name], BAlinda Sierras MD.  The care coordination team was consulted for assistance with disease management and care coordination needs.    Engaged with patient by telephone for follow up visit.  Recent office visits: 01/16/23 BCarolann Littler MD - Hematochezia. START Slow Iron 1 qd  01/06/23 SRandall An RN - Anticoag, INR 2.9. 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 Amiodarone 200 mg changed to 1 tablet Mon-Fri. Valsartan increased to 80 mg daily. Follow up in 6 months.   Hospital visits: None   Objective:  Lab Results  Component Value Date   CREATININE 1.07 11/04/2022   BUN 18 11/04/2022   GFR 64.07 11/04/2022   EGFR 61 10/19/2022   GFRNONAA 44 (L) 03/21/2022   GFRAA 90 11/04/2020   NA 139 11/04/2022   K 4.4 11/04/2022   CALCIUM 8.6 11/04/2022   CO2 29 11/04/2022   GLUCOSE 92 11/04/2022    Lab Results  Component Value Date/Time   HGBA1C 5.9 (H) 02/15/2022 10:56 AM   HGBA1C 6.0 (H) 03/06/2015 02:45 PM   GFR 64.07 11/04/2022 10:45 AM   GFR 47.03 (L) 02/23/2022 04:07 PM    Last  diabetic Eye exam: No results found for: "HMDIABEYEEXA"  Last diabetic Foot exam: No results found for: "HMDIABFOOTEX"   Lab Results  Component Value Date   CHOL 131 09/01/2022   HDL 50.00 09/01/2022   LDLCALC 65 09/01/2022   TRIG 83.0 09/01/2022   CHOLHDL 3 09/01/2022       Latest Ref Rng & Units 10/19/2022    1:49 PM 03/09/2022    5:05 AM 02/28/2022   12:58 AM  Hepatic Function  Total Protein 6.0 - 8.5 g/dL 6.3  6.3  6.0   Albumin 3.7 - 4.7 g/dL 3.7  2.8  3.0   AST 0 - 40 IU/L 39  22  35   ALT 0 - 44 IU/L 35  31  46   Alk Phosphatase 44 - 121 IU/L 132  78  79   Total Bilirubin 0.0 - 1.2 mg/dL 0.4  0.3  0.9     Lab Results  Component Value Date/Time   TSH 3.000 10/19/2022 01:49 PM   TSH 2.650 04/04/2022 02:57 PM   FREET4 1.29 03/20/2017 02:41 PM   FREET4 0.9 10/19/2009 02:40 PM       Latest Ref Rng & Units 01/16/2023    4:01 PM 11/04/2022   10:45 AM 03/29/2022    4:01 PM  CBC  WBC 4.0 - 10.5 K/uL 7.3  8.8  7.1   Hemoglobin 13.0 - 17.0 g/dL 13.7  12.4  12.5   Hematocrit 39.0 -  52.0 % 41.1  37.4  37.2   Platelets 150.0 - 400.0 K/uL 132.0  144.0  146     Lab Results  Component Value Date/Time   VITAMINB12 620 02/28/2022 12:58 AM   VITAMINB12 617 09/03/2014 02:33 PM    Clinical ASCVD: Yes  The ASCVD Risk score (Arnett DK, et al., 2019) failed to calculate for the following reasons:   The 2019 ASCVD risk score is only valid for ages 105 to 15   The patient has a prior MI or stroke diagnosis        09/07/2022    2:24 PM 09/02/2022   11:51 AM 07/28/2022    3:02 PM  Depression screen PHQ 2/9  Decreased Interest 0 0 0  Down, Depressed, Hopeless 0 0 0  PHQ - 2 Score 0 0 0  Altered sleeping 0 0   Tired, decreased energy 0 0   Change in appetite 0 0   Feeling bad or failure about yourself  0 0   Trouble concentrating 0 0   Moving slowly or fidgety/restless 0 0   Suicidal thoughts 0 0   PHQ-9 Score 0 0   Difficult doing work/chores Not difficult at all Not  difficult at all      Social History   Tobacco Use  Smoking Status Never  Smokeless Tobacco Never  Tobacco Comments   Does not smoke.   BP Readings from Last 3 Encounters:  01/16/23 (!) 152/60  01/06/23 (!) 124/48  12/19/22 138/62   Pulse Readings from Last 3 Encounters:  01/16/23 (!) 55  01/06/23 (!) 56  12/19/22 (!) 55   Wt Readings from Last 3 Encounters:  01/16/23 121 lb 9.6 oz (55.2 kg)  12/19/22 124 lb 12.8 oz (56.6 kg)  12/02/22 121 lb (54.9 kg)   BMI Readings from Last 3 Encounters:  01/16/23 22.98 kg/m  12/19/22 23.58 kg/m  12/02/22 22.86 kg/m    Allergies  Allergen Reactions   Amlodipine Other (See Comments)    Swelling?   Codeine Other (See Comments)    Pt does not remember   Iodine Swelling    Medications Reviewed Today     Reviewed by [name], MD (Physician) on 01/16/23 at 1530  Med List Status: <None>   Medication Order Taking? Sig Documenting Provider Last Dose Status Informant  acetaminophen (TYLENOL) 325 MG tablet [id number] No Take 1-2 tablets (325-650 mg total) by mouth every 4 (four) hours as needed for mild pain. Bary Leriche, PA-C Taking Active   albuterol (VENTOLIN HFA) 108 (90 Base) MCG/ACT inhaler [id number] No INHALE 2 PUFFS BY MOUTH EVERY 4 HOURS ASNEEDED FOR WHEEZING OR SHORTNESS OF BREATH. [name], MD Taking Active Family Member  amiodarone (PACERONE) 200 MG tablet [id number] No Take 1 tablet by mouth daily Monday - Friday. [name], MD Taking Active   amoxicillin (AMOXIL) 500 MG capsule [id number] No Take 4 tablets 1 hour prior to dental procedure [name], MD Taking Active   Ascorbic Acid (VITAMIN C) 1000 MG tablet [id number] No Take 1,000 mg by mouth daily. [provider] Taking Active Family Member  atorvastatin (LIPITOR) 80 MG tablet [id number] No TAKE 1 TABLET BY MOUTH EVERY DAY [name], [name], MD Taking Active   clopidogrel (PLAVIX) 75 MG tablet [id number] No Take 1 tablet (75 mg  total) by mouth daily with breakfast. [name], MD Taking Active   diclofenac Sodium (VOLTAREN) 1 % GEL [id number] No Apply 4 g topically 4 (  four) times daily. Deno Etienne, DO Taking Active Family Member  ezetimibe (ZETIA) 10 MG tablet [id number] No Take 1 tablet (10 mg total) by mouth daily. [name], MD Taking Active   furosemide (LASIX) 20 MG tablet [id number] No Take 1 tablet (20 mg total) by mouth daily as needed. For swelling in the legs, shortness of breath or if your weight goes up by 2 lbs overnight. Call cardiology for input. Flora Lipps Taking Active   JARDIANCE 10 MG TABS tablet [id number] No TAKE 1 TABLET BY MOUTH EVERY DAY [name], [name], MD Taking Active   loratadine (CLARITIN) 10 MG tablet [id number] No Take 10 mg by mouth daily as needed for allergies. [provider] Taking Active Family Member  melatonin 5 MG TABS [id number] No Take 1 tablet (5 mg total) by mouth at bedtime as needed (sleep). Bary Leriche, PA-C Taking Active   Multiple Vitamins-Minerals (MULTIVITAMIN,TX-MINERALS) tablet [id number] No Take 1 tablet by mouth daily. [provider] Taking Active Family Member  nitroGLYCERIN (NITROSTAT) 0.4 MG SL tablet [id number] No Place 1 tablet (0.4 mg total) under the tongue every 5 (five) minutes x 3 doses as needed for chest pain. Isaiah Serge, NP Taking Active Family Member  valsartan (DIOVAN) 80 MG tablet [id number] No Take 1.5 tablets (120 mg total) by mouth daily. Martinique, Betty G, MD Taking Active   warfarin (COUMADIN) 4 MG tablet [id number]  TAKE 1 TABLET BY MOUTH DAILY EXCEPT TAKE 1/2 TABLET ON MONDAYS AND FRIDAYS OR AS DIRECTED BY ANTICOAGULATION CLINIC [name], [name], MD  Active   Med List Note Valere Dross 02/15/22 1529): Rodena Piety (Niece) [phone] & Sister-in-law [phone] Unk Lightning)             SDOH:  (Social Determinants of Health) assessments and interventions performed: Yes SDOH Interventions     Flowsheet Row Clinical Support from 07/28/2022 in [place] at [facility] from 07/27/2022 in [place] at [facility] ED to Hosp-Admission (Discharged) from 02/27/2022 in [place] from 06/21/2021 in [place] at [place] Interventions Intervention Not Indicated -- Intervention Not Indicated Intervention Not Indicated  Housing Interventions Intervention Not Indicated -- Intervention Not Indicated Intervention Not Indicated  Transportation Interventions Intervention Not Indicated -- Intervention Not Indicated Intervention Not Indicated  Financial Strain Interventions Intervention Not Indicated Intervention Not Indicated Intervention Not Indicated Intervention Not Indicated  Physical Activity Interventions Intervention Not Indicated -- -- Intervention Not Indicated  Stress Interventions Intervention Not Indicated -- -- Intervention Not Indicated  Social Connections Interventions Intervention Not Indicated -- -- Intervention Not Indicated       Medication Assistance: None required.  Patient affirms current coverage meets needs.  Medication Access: Within the past 30 days, how often has patient missed a dose of medication? None Is a pillbox or other method used to improve adherence? Yes  Factors that may affect medication adherence? no barriers identified Are meds synced by current pharmacy? No  Are meds delivered by current pharmacy? No  Does patient experience delays in picking up medications due to transportation concerns? No   [facility] Reviewed: Is patient disadvantaged to use [facility]?: No  Current Rx insurance plan: [facility] Name and location of Current pharmacy:  [place], [name] [address] Phone: [phone] Fax: [phone]  [id number] [place] [place] - [address] [place] [address]: [phone]: [phone] [facility] services reviewed with patient today?: No  Patient requests to transfer care to [facility]?: No  Reason patient declined to change pharmacies: Loyalty to other pharmacy/Patient preference  Compliance/Adherence/Medication fill history: Care Gaps: COVID Vaccine  Star-Rating Drugs: Atorvastatin 812mPDC 100% Ezetimibe 1050mDC 99% Jardiance 90m66mC 97% Valsartan 80mg69m 100%   Assessment/Plan   Hypertension (BP goal <130/80) -Controlled -Current treatment: Valsartan 80mg 47mx 20mg 160mprn swelling -Medications previously tried: Amlodipine, Losartan, Metoprolol, Olmesartan -Current home readings: 142/49  HR 51 126/51 HR 60 -Current dietary habits: watching salt in diet -Current exercise habits: lifts weights daily -Denies hypotensive/hypertensive symptoms -Educated on BP goals and benefits of medications for prevention of heart attack, stroke and kidney damage; Daily salt intake goal < 2300 mg; Symptoms of hypotension and importance of maintaining adequate hydration; -Recommended to continue current medication Recommended to continue daily BP checks  Atrial Fibrillation (Goal: prevent stroke and major bleeding) -Controlled -CHA2DS2VASC: 7 -Current treatment: Rhythym control: Amiodarone 200mg 1 46mM-F Anticoagulation: Warfarin 4mg dail82mxcept 2mg on M,13mMedications previously tried: None -Home BP and HR readings: see above  -Counseled on increased risk of stroke due to Afib and benefits of anticoagulation for stroke prevention; importance of adherence to anticoagulant exactly as prescribed; bleeding risk associated with warfarin and importance of self-monitoring for signs/symptoms of bleeding; avoidance of NSAIDs due to increased bleeding risk with anticoagulants; -Recommended to continue current medication  Petar Mucci H  Maren ReamerPharmacist [phone]

## 2023-01-23 ENCOUNTER — Telehealth: Payer: Self-pay

## 2023-01-23 NOTE — Progress Notes (Signed)
Care Management & Coordination Services Pharmacy Team  Reason for Encounter: Appointment Reminder  Attempted to contact patient to confirm telephone appointment with Burman Riis, PharmD on 01/24/2023 at 3:15. Unsuccessful outreach. Left voicemail for patient to return call.  Do you have any problems getting your medications?  If yes what types of problems are you experiencing?   What is your top health concern you would like to discuss at your upcoming visit?   Have you seen any other providers since your last visit with PCP?   Care Gaps: AWV - completed 07/28/2022, scheduled 08/02/2023 Covid - overdue  Star Rating Drugs: Atorvastatin 80 mg - [facility] Jardiance 10 mg - [facility]  Valsartan 80 mg - last filled 11/15/2022 90 DS at [facility] Pharmacist Assistant [phone]

## 2023-01-24 ENCOUNTER — Ambulatory Visit: Payer: Medicare Other

## 2023-02-01 ENCOUNTER — Other Ambulatory Visit: Payer: Self-pay | Admitting: Cardiology

## 2023-02-01 ENCOUNTER — Other Ambulatory Visit: Payer: Self-pay | Admitting: Family Medicine

## 2023-02-07 ENCOUNTER — Telehealth: Payer: Self-pay | Admitting: *Deleted

## 2023-02-07 MED ORDER — AMOXICILLIN 500 MG PO CAPS: ORAL_CAPSULE | ORAL | 1 refills | Status: DC

## 2023-02-07 NOTE — Addendum Note (Signed)
Addended by: Michae Kava on: 02/07/2023 11:51 AM   Modules accepted: Orders

## 2023-02-07 NOTE — Telephone Encounter (Signed)
   Pre-operative Risk Assessment    Patient Name: Jesus [name]  DOB: April 03, 1941 MRN: [id number]     Request for Surgical Clearance    Procedure:   GUM SURGERY, LOWER RIGHT JAW  Date of Surgery:  Clearance TBD                                 Surgeon:  DR. Malachy Chamber, DDS Surgeon's Group or Practice Name: DR. Malachy Chamber, DDS  Phone number:  [phone] Fax number:  LEFT MESSAGE TO CALL BACK WITH FAX #   Type of Clearance Requested:   - Medical  - Pharmacy:  Hold Clopidogrel (Plavix) and Warfarin (Coumadin)     Type of Anesthesia:  Not Indicated LEFT MESSAGE TO CALL BACK WITH TYPE OF ANESTHESIA BEING USED; IF ANY   Additional requests/questions:    Jiles Prows   02/07/2023, 8:10 AM

## 2023-02-07 NOTE — Telephone Encounter (Addendum)
    Primary Cardiologist: [name], MD  Chart reviewed as part of pre-operative protocol coverage. Simple dental extractions are considered low risk procedures per guidelines and generally do not require any specific cardiac clearance. It is also generally accepted that for simple extractions and dental cleanings, there is no need to interrupt blood thinner therapy.  May proceed with planned procedure.  SBE prophylaxis is required for the patient.  I will route this recommendation to the requesting party via Epic fax function and remove from pre-op pool.  Please call with questions.  Deberah Pelton, NP 02/07/2023, 10:39 AM

## 2023-02-07 NOTE — Telephone Encounter (Signed)
ADDENDUM:   PROCEDURE IS CROWN LENGTHENING; (THIS IS WHERE GUN TISSUE IS CUT AWAY FROM THE TOOTH)  ANESTHESIA: LOCAL PH# [phone] FAX# [phone]   QUESTION FROM DENTAL OFFICE IS HOW LONG AFTER HEART ATTACK BEFORE THEY CAN PROCEED.

## 2023-02-07 NOTE — Telephone Encounter (Signed)
Ok per Coletta Memos, FNP to send in ABX. DDS office called and asked if we would be able to send in ABX. Per pre op APP ok to send in ABX. Jesus [name] with dental office is aware ABX is being sent in today.

## 2023-02-14 ENCOUNTER — Telehealth: Payer: Self-pay

## 2023-02-14 NOTE — Telephone Encounter (Signed)
Pt called [facility] location and reported he missed a call from there. There are no notes of an attempted call from [facility] so a msg was sent to coumadin clinic to inquire if the call was from coumadin clinic.  Contacted pt and advised coumadin clinic did not call and there is not mention of anyone trying to contact him today. Pt reports the missed call could have been old on his phone. Advised if any changes contact the office. Verified next coumadin clinic apt on 3/15 with pt. Pt verbalized understanding.

## 2023-02-17 ENCOUNTER — Ambulatory Visit (INDEPENDENT_AMBULATORY_CARE_PROVIDER_SITE_OTHER): Payer: Medicare Other

## 2023-02-17 VITALS — BP 112/48 | HR 60

## 2023-02-17 DIAGNOSIS — Z7901 Long term (current) use of anticoagulants: Secondary | ICD-10-CM | POA: Diagnosis not present

## 2023-02-17 LAB — POCT INR: INR: 2.5 (ref 2.0–3.0)

## 2023-02-17 NOTE — Progress Notes (Signed)
Continue 1 tablet daily except take 1/2 tablet on Mondays and Fridays. Recheck in 6 weeks.  

## 2023-02-17 NOTE — Patient Instructions (Addendum)
Pre visit review using our clinic review tool, if applicable. No additional management support is needed unless otherwise documented below in the visit note.  Continue 1 tablet daily except take 1/2 tablet on Mondays and Fridays. Recheck in 6 weeks.  

## 2023-03-01 DIAGNOSIS — R070 Pain in throat: Secondary | ICD-10-CM | POA: Diagnosis not present

## 2023-03-02 ENCOUNTER — Ambulatory Visit (INDEPENDENT_AMBULATORY_CARE_PROVIDER_SITE_OTHER): Payer: Medicare Other | Admitting: Family Medicine

## 2023-03-02 ENCOUNTER — Encounter: Payer: Self-pay | Admitting: Family Medicine

## 2023-03-02 VITALS — BP 130/50 | HR 65 | Temp 99.0°F | Ht 61.0 in | Wt 119.6 lb

## 2023-03-02 DIAGNOSIS — T7840XA Allergy, unspecified, initial encounter: Secondary | ICD-10-CM

## 2023-03-02 DIAGNOSIS — J029 Acute pharyngitis, unspecified: Secondary | ICD-10-CM | POA: Diagnosis not present

## 2023-03-02 DIAGNOSIS — R059 Cough, unspecified: Secondary | ICD-10-CM | POA: Diagnosis not present

## 2023-03-02 LAB — POC COVID19 BINAXNOW: SARS Coronavirus 2 Ag: NEGATIVE

## 2023-03-02 LAB — POCT RAPID STREP A (OFFICE): Rapid Strep A Screen: NEGATIVE

## 2023-03-02 MED ORDER — FEXOFENADINE HCL 60 MG PO TABS: 60.0000 mg | ORAL_TABLET | Freq: Two times a day (BID) | ORAL | 0 refills | Status: DC

## 2023-03-02 MED ORDER — BENZONATATE 100 MG PO CAPS: 100.0000 mg | ORAL_CAPSULE | Freq: Two times a day (BID) | ORAL | 0 refills | Status: DC | PRN

## 2023-03-02 NOTE — Patient Instructions (Addendum)
It was nice meeting you today.  Your testing for strep throat and COVID were negative.  A prescription for Tessalon 100 mg was sent to your pharmacy.  This is a medication that you can take for cough twice a day if needed.  A prescription for Allegra 60 mg was also sent to your pharmacy.  This is an allergy pill.

## 2023-03-02 NOTE — Progress Notes (Signed)
Established Patient Office Visit   Subjective  Patient ID: Jesus [name], male    DOB: 1941/05/08  Age: 82 y.o. MRN: [id number]  Chief Complaint  Patient presents with   Cough    Patient complains of cough, Non productive, x2 days    Sore Throat    X 2 days.  Negative strep test done at [facility] 03/01/23.    Pt is an 82 yo male aortic atherosclerosis, A-fib, CHF, HTN, chronic kidney disease, on chronic anticoagulation, CAD, NSTEMI, HLD followed by Dr. Sheldon Silvan and seen for acute concern.  Pt developed a dry cough, "half sore throat", and rhinorrhea 2 nights ago.  Pt was seen at UC in [place], strep testing negative.  States was not given anything for symptoms.  Patient has taken Robitussin.   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  Pt gets up around 6 am to exercise.      ROS Negative unless stated above    Objective:     BP (!) 130/50 (BP Location: Left Arm, Patient Position: Sitting, Cuff Size: Normal)   Pulse 65   Temp 99 F (37.2 C) (Oral)   Ht 5\' 1"  (1.549 m)   Wt 119 lb 9.6 oz (54.3 kg)   SpO2 96%   BMI 22.60 kg/m    Physical Exam Constitutional:      General: He is not in acute distress.    Appearance: Normal appearance.  HENT:     Head: Normocephalic and atraumatic.     Comments: B/l hearing aids in place.    Nose: Nose normal.     Mouth/Throat:     Mouth: Mucous membranes are moist.     Pharynx: No oropharyngeal exudate, posterior oropharyngeal erythema or uvula swelling.     Tonsils: No tonsillar exudate.  Eyes:     Conjunctiva/sclera: Conjunctivae normal.     Pupils: Pupils are equal, round, and reactive to light.  Cardiovascular:     Rate and Rhythm: Normal rate and regular rhythm.     Heart sounds: Murmur heard.     Systolic murmur is present with a grade of 4/6.     No gallop.  Pulmonary:     Effort: Pulmonary effort is normal. No respiratory distress.     Breath sounds: Normal breath  sounds. No wheezing, rhonchi or rales.     Comments: Cough. Skin:    General: Skin is warm and dry.  Neurological:     Mental Status: He is alert and oriented to person, place, and time.      Results for orders placed or performed in visit on 03/02/23  POC COVID-19  Result Value Ref Range   SARS Coronavirus 2 Ag Negative Negative  POC Rapid Strep A  Result Value Ref Range   Rapid Strep A Screen Negative Negative      Assessment & Plan:  Cough, unspecified type -     POC COVID-19 BinaxNow -     Benzonatate; Take 1 capsule (100 mg total) by mouth 2 (two) times daily as needed for cough.  Dispense: 20 capsule; Refill: 0  Sore throat -     POCT rapid strep A  Allergy, initial encounter -     Fexofenadine HCl; Take 1 tablet (60 mg total) by mouth 2 (two) times daily.  Dispense: 30 tablet; Refill: 0   Rapid strep and COVID testing negative.  Patient advised symptoms likely 2/2 seasonal allergies.  Will still  consider viral etiology.  Tessalon as needed for cough and Allegra sent to pharmacy.  Given strict precautions for continued or worsening symptoms.  Return if symptoms worsen or fail to improve.   Billie Ruddy, MD

## 2023-03-03 DIAGNOSIS — J309 Allergic rhinitis, unspecified: Secondary | ICD-10-CM | POA: Diagnosis not present

## 2023-03-08 ENCOUNTER — Telehealth: Payer: Self-pay

## 2023-03-08 NOTE — Progress Notes (Signed)
Care Management & Coordination Services Pharmacy Team  Reason for Encounter: Hypertension  Contacted patient to discuss hypertension disease state. {US HC Outreach:28874}    Current antihypertensive regimen:  Amiodarone 200 mg 1 tablet Mon-Fri Valsartan 80 mg daily Patient verbally confirms he is taking the above medications as directed. {yes/no:20286}  How often are you checking your Blood Pressure? {CHL HP BP Monitoring Frequency:[phone]}  he checks his blood pressure {timing:25218} {before/after:25217} taking his medication.  Current home BP readings: *** DATE:             BP               PULSE   Wrist or arm cuff:  OTC medications including pseudoephedrine or NSAIDs?  Any readings above 180/100? {yes/no:20286} If yes any symptoms of hypertensive emergency? {hypertensive emergency symptoms:25354}  What recent interventions/DTPs have been made by any provider to improve Blood Pressure control since last CPP Visit: ***  Any recent hospitalizations or ED visits since last visit with CPP? {yes/no:20286}  What diet changes have been made to improve Blood Pressure Control?  Patient follows [place] -  Caffeine intake -  Salt intake -   What exercise is being done to improve your Blood Pressure Control?  ***  Adherence Review: Is the patient currently on ACE/ARB medication? Yes Does the patient have >5 day gap between last estimated fill dates? No  Care Gaps: AWV - completed 07/28/2022, scheduled 08/02/2023 Covid - overdue   Star Rating Drugs: Atorvastatin 80 mg - last filled 01/04/2023 30 DS [facility] Jardiance 10 mg - last filled 01/04/2023 30 [facility]  Valsartan 80 mg - last filled 02/10/2023 90 DS at [facility]   Chart Updates: Recent office visits:  03/02/2023 Grier Mitts MD - Patient was seen for cough and additional concerns. Started Benzonatate and Fexofenadine.  Recent consult visits:  None  Hospital visits:   None  Medications: Outpatient Encounter Medications as of 03/08/2023  Medication Sig Note   acetaminophen (TYLENOL) 325 MG tablet Take 1-2 tablets (325-650 mg total) by mouth every 4 (four) hours as needed for mild pain.    albuterol (VENTOLIN HFA) 108 (90 Base) MCG/ACT inhaler INHALE 2 PUFFS BY MOUTH EVERY 4 HOURS ASNEEDED FOR WHEEZING OR SHORTNESS OF BREATH.    amiodarone (PACERONE) 200 MG tablet Take 1 tablet by mouth daily Monday - Friday.    amoxicillin (AMOXIL) 500 MG capsule Take 4 tablets 1 hour prior to dental procedure    Ascorbic Acid (VITAMIN C) 1000 MG tablet Take 1,000 mg by mouth daily.    atorvastatin (LIPITOR) 80 MG tablet TAKE 1 TABLET BY MOUTH EVERY DAY    benzonatate (TESSALON) 100 MG capsule Take 1 capsule (100 mg total) by mouth 2 (two) times daily as needed for cough.    clopidogrel (PLAVIX) 75 MG tablet TAKE 1 TABLET BY MOUTH DAILY WITH BREAKFAST.    diclofenac Sodium (VOLTAREN) 1 % GEL Apply 4 g topically 4 (four) times daily.    ezetimibe (ZETIA) 10 MG tablet Take 1 tablet (10 mg total) by mouth daily.    fexofenadine (ALLEGRA ALLERGY) 60 MG tablet Take 1 tablet (60 mg total) by mouth 2 (two) times daily.    furosemide (LASIX) 20 MG tablet Take 1 tablet (20 mg total) by mouth daily as needed. For swelling in the legs, shortness of breath or if your weight goes up by 2 lbs overnight. Call cardiology for input.    JARDIANCE 10  MG TABS tablet TAKE 1 TABLET BY MOUTH EVERY DAY    loratadine (CLARITIN) 10 MG tablet Take 10 mg by mouth daily as needed for allergies.    melatonin 5 MG TABS Take 1 tablet (5 mg total) by mouth at bedtime as needed (sleep).    Multiple Vitamins-Minerals (MULTIVITAMIN,TX-MINERALS) tablet Take 1 tablet by mouth daily.    nitroGLYCERIN (NITROSTAT) 0.4 MG SL tablet Place 1 tablet (0.4 mg total) under the tongue every 5 (five) minutes x 3 doses as needed for chest pain.    valsartan (DIOVAN) 80 MG tablet Take 1.5 tablets (120 mg total) by mouth daily.  01/24/2023: Pt taking differently: 1 tab daliy   warfarin (COUMADIN) 4 MG tablet TAKE 1 TABLET BY MOUTH DAILY EXCEPT TAKE 1/2 TABLET ON MONDAYS AND FRIDAYS OR AS DIRECTED BY ANTICOAGULATION CLINIC    No facility-administered encounter medications on file as of 03/08/2023.  Fill History:  Dispensed Days Supply Quantity Provider Pharmacy  AMIODARONE HCL 200 MG TABLET 02/25/2023 33 80 each      Dispensed Days Supply Quantity Provider Pharmacy  ATORVASTATIN 80 MG TABLET 02/24/2023 30 30 each      Dispensed Days Supply Quantity Provider Pharmacy  benzonatate 100 mg capsule 03/02/2023 10 20 each      Dispensed Days Supply Quantity Provider Pharmacy  CLOPIDOGREL 75 MG TABLET 03/01/2023 30 30 each      Dispensed Days Supply Quantity Provider Pharmacy  JARDIANCE 10 MG TABLET 03/01/2023 30 30 each      Dispensed Days Supply Quantity Provider Pharmacy  FUROSEMIDE  20 MG TABS 03/15/2022 30 30 tablet      Dispensed Days Supply Quantity Provider Pharmacy  NITROGLYCERIN 0.4 MG TABLET SL 02/20/2022 5 25 each      Dispensed Days Supply Quantity Provider Pharmacy  valsartan 80 mg tablet 02/10/2023 90 90 each      Dispensed Days Supply Quantity Provider Pharmacy  WARFARIN SODIUM  4 MG TABS 02/14/2023 30 75 tablet      Recent Office Vitals: BP Readings from Last 3 Encounters:  03/02/23 (!) 130/50  02/17/23 (!) 112/48  01/16/23 (!) 152/60   Pulse Readings from Last 3 Encounters:  03/02/23 65  02/17/23 60  01/16/23 (!) 55    Wt Readings from Last 3 Encounters:  03/02/23 119 lb 9.6 oz (54.3 kg)  01/16/23 121 lb 9.6 oz (55.2 kg)  12/19/22 124 lb 12.8 oz (56.6 kg)     Kidney Function Lab Results  Component Value Date/Time   CREATININE 1.07 11/04/2022 10:45 AM   CREATININE 1.19 10/19/2022 01:49 PM   CREATININE 0.91 09/12/2016 01:11 PM   CREATININE 0.88 03/11/2016 07:34 AM   GFR 64.07 11/04/2022 10:45 AM   GFRNONAA 44 (L) 03/21/2022 03:52 PM   GFRAA 90 11/04/2020 03:11 PM        Latest Ref Rng & Units 11/04/2022   10:45 AM 10/19/2022    1:49 PM 09/13/2022    2:44 PM  BMP  Glucose 70 - 99 mg/dL 92  99  90   BUN 6 - 23 mg/dL 18  18  14    Creatinine 0.40 - 1.50 mg/dL 1.07  1.19  1.09   BUN/Creat Ratio 10 - 24  15  13    Sodium 135 - 145 mEq/L 139  141  143   Potassium 3.5 - 5.1 mEq/L 4.4  4.5  4.6   Chloride 96 - 112 mEq/L 104  104  105   CO2 19 - 32 mEq/L 29  24  22   Calcium 8.4 - 10.5 mg/dL 8.6  8.7  9.0    [facility] Pharmacist Assistant [phone]

## 2023-03-28 ENCOUNTER — Other Ambulatory Visit: Payer: Self-pay | Admitting: Family Medicine

## 2023-03-31 ENCOUNTER — Ambulatory Visit (INDEPENDENT_AMBULATORY_CARE_PROVIDER_SITE_OTHER): Payer: Medicare Other

## 2023-03-31 VITALS — BP 132/58 | HR 49

## 2023-03-31 DIAGNOSIS — Z7901 Long term (current) use of anticoagulants: Secondary | ICD-10-CM

## 2023-03-31 LAB — POCT INR: INR: 2.9 (ref 2.0–3.0)

## 2023-03-31 NOTE — Patient Instructions (Addendum)
Pre visit review using our clinic review tool, if applicable. No additional management support is needed unless otherwise documented below in the visit note.  Continue 1 tablet daily except take 1/2 tablet on Mondays and Fridays. Recheck in 6 weeks.  

## 2023-03-31 NOTE — Progress Notes (Signed)
Continue 1 tablet daily except take 1/2 tablet on Mondays and Fridays. Recheck in 6 weeks.   Pt requests BP checked today. He checks at home and keeps a log. BP today is 132/58, 49 HR ,  95 O2 Sat %. Pt reports these readings are in line with his readings at home.

## 2023-04-01 ENCOUNTER — Other Ambulatory Visit: Payer: Self-pay | Admitting: Family Medicine

## 2023-04-13 ENCOUNTER — Telehealth: Payer: Self-pay | Admitting: Cardiovascular Disease

## 2023-04-13 MED ORDER — AMOXICILLIN 500 MG PO CAPS: ORAL_CAPSULE | ORAL | 1 refills | Status: DC

## 2023-04-13 NOTE — Telephone Encounter (Signed)
I left message for the pt that he is good to proceed with his dental work with Dr. Duaine Dredge office. SBE Amoxicillin 500 mg has been sent into the pharmacy for him to pick up.   I will update the dental office as well.

## 2023-04-13 NOTE — Telephone Encounter (Signed)
Filling and crown are low bleed risk and wouldn't require anticoag hold, if it's a single root canal may be able to continue his warfarin - would see if dentist has particular INR they are ok with before dental work is completed.

## 2023-04-13 NOTE — Telephone Encounter (Signed)
   Name: Jesus [name]  DOB: 1940-12-11  MRN: [id number]  Primary Cardiologist: [name], MD  Chart reviewed as part of pre-operative protocol coverage. Because of [name]'s past medical history and time since last visit, he will require a follow-up telephone visit in order to better assess preoperative cardiovascular risk.  Pre-op covering staff: - Please schedule appointment and call patient to inform them. If patient already had an upcoming appointment within acceptable timeframe, please add "pre-op clearance" to the appointment notes so provider is aware. - Please contact requesting surgeon's office via preferred method (i.e, phone, fax) to inform them of need for appointment prior to surgery.  The patient has a history of TAVR back in 2016.  He will require prophylactic antibiotics for any dental work.  Coumadin hold was not mentioned on the request.  If this is needed, I will need to send this over to one of our Pharm.D.'s to weigh in.  Sharlene Dory, PA-C  04/13/2023, 8:47 AM

## 2023-04-13 NOTE — Telephone Encounter (Signed)
I s/w the DDS office and asked if DDS has a preference he would like to see INR per the Pharm-d. I left a verbal message with receptionist to have Dr. Geoffery Lyons, DDS call back to our pre op line to advise if he has specific range for INR tobe in for dental procedure.     I left message for the pt to call back for tele pre op appt.

## 2023-04-13 NOTE — Telephone Encounter (Signed)
The pt called back and I s/w the pt and explained to him that we are waiting for the pre op APP to see if he is going to need SBE. Pt said he had dental work in march and we gave him an ABX. The dental work now is with a different dental office and they are asking if pt needs SBE.   I assured the pt that I will update the pre op APP to see clearance from 02/2023. Once I have an answer I will call him. Pt said thank you for the help.

## 2023-04-13 NOTE — Telephone Encounter (Signed)
DDS office called and left vm. In their vm they said they did not need any information about INR, they are more concerned with if the pt needs SBE or not. I will forward this back to pre op for review.

## 2023-04-13 NOTE — Telephone Encounter (Signed)
   Pre-operative Risk Assessment    Patient Name: Jesus [name]  DOB: 14-Feb-1941 MRN: [id number]     Request for Surgical Clearance    Procedure:   Root canal, filling, crown, and hygiene   Date of Surgery:  Clearance 04/24/23                                 Surgeon:  Tonye Becket DDS Surgeon's Group or Practice Name:  [facility] Phone number:  [phone] Fax number:  [phone]   Type of Clearance Requested:   - Medical    Type of Anesthesia:   Unsure what anesthetic the patient will be numbed with    Additional requests/questions:  Does this patient need antibiotics?  [name]   04/13/2023, 8:13 AM

## 2023-04-14 ENCOUNTER — Other Ambulatory Visit: Payer: Self-pay | Admitting: Cardiovascular Disease

## 2023-04-27 ENCOUNTER — Other Ambulatory Visit: Payer: Self-pay | Admitting: Family Medicine

## 2023-04-28 DIAGNOSIS — S51812A Laceration without foreign body of left forearm, initial encounter: Secondary | ICD-10-CM | POA: Diagnosis not present

## 2023-05-03 ENCOUNTER — Other Ambulatory Visit: Payer: Self-pay | Admitting: Family Medicine

## 2023-05-03 ENCOUNTER — Other Ambulatory Visit: Payer: Self-pay | Admitting: Cardiovascular Disease

## 2023-05-03 DIAGNOSIS — Z7901 Long term (current) use of anticoagulants: Secondary | ICD-10-CM

## 2023-05-03 NOTE — Telephone Encounter (Signed)
Pt is compliant with warfarin management and PCP apts.  Sent in refill of warfarin to requested pharmacy.      

## 2023-05-09 ENCOUNTER — Ambulatory Visit (INDEPENDENT_AMBULATORY_CARE_PROVIDER_SITE_OTHER): Payer: Medicare Other

## 2023-05-09 VITALS — BP 130/54 | HR 52

## 2023-05-09 DIAGNOSIS — Z7901 Long term (current) use of anticoagulants: Secondary | ICD-10-CM | POA: Diagnosis not present

## 2023-05-09 LAB — POCT INR: INR: 2.7 (ref 2.0–3.0)

## 2023-05-09 NOTE — Progress Notes (Signed)
Continue 1 tablet daily except take 1/2 tablet on Mondays and Fridays. Recheck in 6 weeks.  Pt requests BP checked today. He checks at home and keeps a log. BP today is 130/54, 52 HR ,  95 O2 Sat %. Pt reports he is getting some higher readings at home occasionally but usually when he gets home from the gym and immediately takes it. Provided pt with BP log from the American Heart Association website and educated pt on how and when to take BP readings. Pt verbalized understanding.

## 2023-05-09 NOTE — Patient Instructions (Addendum)
Pre visit review using our clinic review tool, if applicable. No additional management support is needed unless otherwise documented below in the visit note.  Continue 1 tablet daily except take 1/2 tablet on Mondays and Fridays. Recheck in 6 weeks.  

## 2023-05-12 ENCOUNTER — Ambulatory Visit: Payer: Medicare Other

## 2023-05-15 ENCOUNTER — Telehealth: Payer: Self-pay | Admitting: *Deleted

## 2023-05-15 NOTE — Telephone Encounter (Signed)
Patient is returning call and is requesting call back.  

## 2023-05-15 NOTE — Telephone Encounter (Signed)
   Pre-operative Risk Assessment    Patient Name: Jesus [name]  DOB: 08/03/1941 MRN: [id number]    Dental Requests If request is for dental extraction, please clarify the number of teeth to be extracted and place under No 1 below.   If the patient is currently at the dentist's office, send SecureChat message to Pre-Op Callback Staff (CMA/nurse) to input urgent request.   If the patient is not currently in the dentist's office, please route to the Pre-Op pool.              :1} See notes from earlier today.  Request for Surgical Clearance    Procedure:   PER THE PT HE IS TO HAVE 1-2 TEETH FILLED; I HAVE LEFT MESSAGE FOR THE DDS TO PLEASE CONFIRM THIS INFORMATION  Date of Surgery:  Clearance 05/17/23                                 Surgeon:  DR. Geoffery Lyons Surgeon's Group or Practice Name: [facility]  Phone number: [phone] Fax number:   [phone]   Type of Clearance Requested:   - Medical  - Pharmacy:  Hold Clopidogrel (Plavix) and Warfarin (Coumadin)     Type of Anesthesia:  Not Indicated   Additional requests/questions:    Elpidio Anis   05/15/2023, 2:09 PM

## 2023-05-15 NOTE — Telephone Encounter (Signed)
I s/w the pt and he tells me that he believes the procedure that is supposed to be done Wed 05/17/23 is only to have 1-2 teeth filled.   I then called and left another message for the DDS office to call back and confirm this information given to [place] by the pt. I will also fax these notes to DDS office as FYI to please reply to our office for clarification.

## 2023-05-15 NOTE — Telephone Encounter (Signed)
See phone notes from 04/13/23 for clearance for dental procedure. Our office received a clearance request today which after reviewing the chart seems to be a duplicate from 04/13/23. I have left a message for the dental office to call back and confirm if this is the same procedure. In previous notes our office had faxed in ABX for the pt.   Message was left for the pt on 04/13/23 that he had been cleared for his dental procedure and that we had sent in an Rx for ABX. I tried to call the pt today but recording came on and said answering service for this person is full and could not leave a message.   I will re-fax notes to the DDS office, as my guess is that they may have not received notes.

## 2023-05-16 ENCOUNTER — Telehealth: Payer: Self-pay | Admitting: Cardiovascular Disease

## 2023-05-16 NOTE — Telephone Encounter (Signed)
Agree do not recommend holding anticoag for 1-2 fillings. He does require SBE prophylaxis before all dental work/cleanings though with his history of TAVR, should already have active rx on file for amoxicillin.

## 2023-05-16 NOTE — Telephone Encounter (Signed)
See clearance notes  

## 2023-05-16 NOTE — Telephone Encounter (Signed)
DDS office called back, see notes below. The only change in the notes we gave clearance on is that now they called back and stated that a crown will be done as well. To be sure clearance we have provided today is still acceptable, I will run this past our pre op APP. Just need to be sure clearance still god now as it will now be for 2 fillings and a crown.      Manson Passey, Shanell25 minutes ago (1:33 PM)   [facility] DDS office called back to let Pre-Op know the procedure tomorrow is for 2 fillings and a crown being done.She stated if any questions or concerns please call the office. Please advise      Note   [facility] DDS [phone]  Shella Spearing minutes ago (1:31 PM)

## 2023-05-16 NOTE — Telephone Encounter (Signed)
I will fax notes to DDS office clearance from earlier today is still good to use. It has been noted that the pt will have a crown as well.

## 2023-05-16 NOTE — Telephone Encounter (Signed)
DDS office called back to let Pre-Op know the procedure tomorrow is for 2 fillings and a crown being done.She stated if any questions or concerns please call the office. Please advise

## 2023-05-16 NOTE — Telephone Encounter (Signed)
    Primary Cardiologist: [name], MD  Chart reviewed as part of pre-operative protocol coverage. Simple dental extractions are considered low risk procedures per guidelines and generally do not require any specific cardiac clearance. It is also generally accepted that for simple extractions and dental cleanings, there is no need to interrupt blood thinner therapy.    He does require SBE prophylaxis before all dental work/cleanings though with his history of TAVR, should already have active rx on file for amoxicillin.   I will route this recommendation to the requesting party via Epic fax function and remove from pre-op pool.  Please call with questions.  Sharlene Dory, PA-C 05/16/2023, 10:45 AM

## 2023-05-30 ENCOUNTER — Other Ambulatory Visit: Payer: Self-pay | Admitting: Family Medicine

## 2023-06-05 ENCOUNTER — Ambulatory Visit: Payer: Medicare Other | Attending: Cardiovascular Disease

## 2023-06-05 DIAGNOSIS — I5022 Chronic systolic (congestive) heart failure: Secondary | ICD-10-CM

## 2023-06-05 DIAGNOSIS — I2510 Atherosclerotic heart disease of native coronary artery without angina pectoris: Secondary | ICD-10-CM

## 2023-06-05 DIAGNOSIS — I4891 Unspecified atrial fibrillation: Secondary | ICD-10-CM

## 2023-06-05 LAB — CBC WITH DIFFERENTIAL/PLATELET

## 2023-06-05 LAB — LIPID PANEL
Chol/HDL Ratio: 2.7 ratio (ref 0.0–5.0)
HDL: 50 mg/dL (ref >39)

## 2023-06-05 LAB — COMPREHENSIVE METABOLIC PANEL
Sodium: 142 mmol/L (ref 134–144)
Total Protein: 6.6 g/dL (ref 6.0–8.5)

## 2023-06-06 LAB — CBC WITH DIFFERENTIAL/PLATELET
Basophils Absolute: 0.1 10*3/uL (ref 0.0–0.2)
Basos: 1 %
EOS (ABSOLUTE): 0.1 10*3/uL (ref 0.0–0.4)
Hematocrit: 39.2 % (ref 37.5–51.0)
Hemoglobin: 12.9 g/dL — ABNORMAL LOW (ref 13.0–17.7)
Immature Grans (Abs): 0 10*3/uL (ref 0.0–0.1)
Immature Granulocytes: 0 %
Lymphocytes Absolute: 1.7 10*3/uL (ref 0.7–3.1)
Lymphs: 20 %
MCH: 32.5 pg (ref 26.6–33.0)
MCV: 99 fL — ABNORMAL HIGH (ref 79–97)
Monocytes Absolute: 0.8 10*3/uL (ref 0.1–0.9)
Monocytes: 10 %
Neutrophils Absolute: 5.5 10*3/uL (ref 1.4–7.0)
Neutrophils: 67 %
RBC: 3.97 x10E6/uL — ABNORMAL LOW (ref 4.14–5.80)
RDW: 12.5 % (ref 11.6–15.4)
WBC: 8.2 10*3/uL (ref 3.4–10.8)

## 2023-06-06 LAB — COMPREHENSIVE METABOLIC PANEL WITH GFR
ALT: 24 IU/L (ref 0–44)
AST: 26 IU/L (ref 0–40)
Albumin: 4.2 g/dL (ref 3.7–4.7)
Alkaline Phosphatase: 153 IU/L — ABNORMAL HIGH (ref 44–121)
BUN/Creatinine Ratio: 18 (ref 10–24)
CO2: 24 mmol/L (ref 20–29)
Calcium: 9.2 mg/dL (ref 8.6–10.2)
Creatinine, Ser: 1.14 mg/dL (ref 0.76–1.27)
Globulin, Total: 2.4 g/dL (ref 1.5–4.5)
Potassium: 4.3 mmol/L (ref 3.5–5.2)

## 2023-06-06 LAB — COMPREHENSIVE METABOLIC PANEL
BUN: 21 mg/dL (ref 8–27)
Bilirubin Total: 0.4 mg/dL (ref 0.0–1.2)
Chloride: 106 mmol/L (ref 96–106)
Glucose: 91 mg/dL (ref 70–99)
eGFR: 63 mL/min/{1.73_m2} (ref >59)

## 2023-06-06 LAB — LIPID PANEL
Cholesterol, Total: 135 mg/dL (ref 100–199)
LDL Chol Calc (NIH): 68 mg/dL (ref 0–99)
Triglycerides: 90 mg/dL (ref 0–149)
VLDL Cholesterol Cal: 17 mg/dL (ref 5–40)

## 2023-06-06 LAB — TSH: TSH: 5.03 u[IU]/mL — ABNORMAL HIGH (ref 0.450–4.500)

## 2023-06-12 ENCOUNTER — Telehealth: Payer: Self-pay

## 2023-06-12 DIAGNOSIS — Z79899 Other long term (current) drug therapy: Secondary | ICD-10-CM

## 2023-06-12 NOTE — Telephone Encounter (Signed)
-----   Message from [name], MD sent at 06/11/2023  7:33 AM EDT ----- Reviewed. Should draw fT4 at office visit next week. Consider decrease amiodarone to 100 mg.

## 2023-06-12 NOTE — Telephone Encounter (Signed)
Order placed for free t4 to be drawn and scheduled for lab appt same day as upcoming OV.

## 2023-06-15 ENCOUNTER — Ambulatory Visit: Payer: Medicare Other | Attending: Cardiovascular Disease | Admitting: Cardiovascular Disease

## 2023-06-15 ENCOUNTER — Ambulatory Visit: Payer: Medicare Other

## 2023-06-15 ENCOUNTER — Encounter: Payer: Self-pay | Admitting: Cardiovascular Disease

## 2023-06-15 VITALS — BP 110/60 | HR 54 | Ht 61.0 in | Wt 121.6 lb

## 2023-06-15 DIAGNOSIS — Z79899 Other long term (current) drug therapy: Secondary | ICD-10-CM

## 2023-06-15 DIAGNOSIS — E785 Hyperlipidemia, unspecified: Secondary | ICD-10-CM | POA: Diagnosis not present

## 2023-06-15 DIAGNOSIS — I4819 Other persistent atrial fibrillation: Secondary | ICD-10-CM

## 2023-06-15 DIAGNOSIS — Z952 Presence of prosthetic heart valve: Secondary | ICD-10-CM

## 2023-06-15 DIAGNOSIS — I10 Essential (primary) hypertension: Secondary | ICD-10-CM

## 2023-06-15 DIAGNOSIS — I2510 Atherosclerotic heart disease of native coronary artery without angina pectoris: Secondary | ICD-10-CM | POA: Diagnosis not present

## 2023-06-15 DIAGNOSIS — I5022 Chronic systolic (congestive) heart failure: Secondary | ICD-10-CM | POA: Diagnosis not present

## 2023-06-15 MED ORDER — ASPIRIN 81 MG PO TBEC
81.0000 mg | DELAYED_RELEASE_TABLET | Freq: Every day | ORAL | 0 refills | Status: AC
Start: 2023-06-15 — End: ?

## 2023-06-15 NOTE — Patient Instructions (Signed)
Medication Instructions:  STOP Clopidogrel/Plavix START Aspirin 81mg  daily *If you need a refill on your cardiac medications before your next appointment, please call your pharmacy*   Lab Work: NONE (free T4 already ordered) If you have labs (blood work) drawn today and your tests are completely normal, you will receive your results only by: MyChart Message (if you have MyChart) OR A paper copy in the mail If you have any lab test that is abnormal or we need to change your treatment, we will call you to review the results.  Testing/Procedures: ECHO (prior to next visit in 6 months) Your physician has requested that you have an echocardiogram. Echocardiography is a painless test that uses sound waves to create images of your heart. It provides your doctor with information about the size and shape of your heart and how well your heart's chambers and valves are working. This procedure takes approximately one hour. There are no restrictions for this procedure. Please do NOT wear cologne, perfume, aftershave, or lotions (deodorant is allowed). Please arrive 15 minutes prior to your appointment time.  Follow-Up: At [facility], you and your health needs are our priority.  As part of our continuing mission to provide you with exceptional heart care, we have created designated Provider Care Teams.  These Care Teams include your primary Cardiologist (physician) and Advanced Practice Providers (APPs -  Physician Assistants and Nurse Practitioners) who all work together to provide you with the care you need, when you need it.  We recommend signing up for the patient portal called "MyChart".  Sign up information is provided on this After Visit Summary.  MyChart is used to connect with patients for Virtual Visits (Telemedicine).  Patients are able to view lab/test results, encounter notes, upcoming appointments, etc.  Non-urgent messages can be sent to your provider as well.   To learn more about  what you can do with MyChart, go to [email or web address].    Your next appointment:   6 month(s)  Provider:   [name], MD

## 2023-06-15 NOTE — Progress Notes (Signed)
Cardiology Office Note:    Date:  06/15/2023   ID:  Skip Estimable, DOB May 24, 1941, MRN [id number]  PCP:  Kristian Covey, MD   [facility] Providers Cardiologist:  [name], MD Electrophysiologist:  Sherryl Manges, MD     Referring MD: Kristian Covey, MD   Chief Complaint  Patient presents with   Coronary Artery Disease    History of Present Illness:    Jesus [name] is a 82 y.o. male with a hx of coronary artery disease status post remote CABG, persistent atrial fibrillation anticoagulated with warfarin, severe aortic stenosis status post TAVR, chronic systolic heart failure, hypertension, and mixed hyperlipidemia. The patient has a history of remote anterior MI in 1980. He underwent multivessel CABG in 1997. He has had an LVEF around 40 to 45% with inferior wall scar. The patient had a non-ST elevation infarct in 2023 and was found to have severe stenosis in the saphenous vein graft to PDA, treated with a drug-eluting stent. He experienced nonsustained VT and was treated with IV amiodarone and beta-blockade. He was readmitted with acute on chronic systolic heart failure and medications were adjusted.   The patient is here with his sister-in-law today.  He reports that he has been doing okay from a cardiac perspective.  He continues to walk at [facility] 5 days/week.  He is still wanting to stay active is much as possible.  He denies exertional chest pain or pressure.  No shortness of breath with activity.  No heart palpitations, lightheadedness, or syncope.  He reports compliance with his medicines.  He has a lot of bruising on his arms but denies any significant bleeding problems.  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  S/P Ant MI 1980;  b. 1997 S/P CABG x 8 (LIMA to diag-LAD, SVG to OM1-OM2-OM3, SVG to [facility] - Dr Andrey Campanile);  c. 01/2015 Cath: 3VD, 8/8 patent grafts.   Cardiomyopathy    Dysrhythmia    Esophageal reflux    GERD (gastroesophageal reflux disease)    Headache    History of colonoscopy    History of transesophageal echocardiography (TEE) for monitoring    Other and unspecified hyperlipidemia    Paroxysmal atrial fibrillation (HCC) 02/20/2022   S/P angioplasty with stent 02/17/22 DES to proximal VG to PDA 02/20/2022   S/P TAVR (transcatheter aortic valve replacement)    a. 03/2015 26 mm Edwards Sapien 3 transcatheter heart valve placed via open left transfemoral approach.   Severe aortic stenosis 08/10/2012   Skin lesions, generalized    facial which may represent actinic keratoses and possible photosensitivity from Amiodarone   Unspecified essential hypertension     Past Surgical History:  Procedure Laterality Date   CARDIAC CATHETERIZATION     CARDIOVERSION  11/18/2006   Dr. Jacklynn Bue   CATARACT EXTRACTION W/ INTRAOCULAR LENS IMPLANT  April '13  (Dr. Dagoberto Ligas)   left eye only   CHOLECYSTECTOMY     CORONARY ARTERY BYPASS GRAFT  02/09/1996   LIMA to diag-LAD, SVG to OM1-OM2-OM3, SVG to [facility]   CORONARY STENT INTERVENTION N/A 02/17/2022   Procedure: CORONARY STENT INTERVENTION;  Surgeon: Kathleene Hazel, MD;  [facility]: [facility] INVASIVE CV LAB;  Service: Cardiovascular;  Laterality: N/A;   CORONARY/GRAFT ANGIOGRAPHY N/A 02/17/2022   Procedure: CORONARY/GRAFT ANGIOGRAPHY;  Surgeon: Kathleene Hazel, MD;  Location: [facility] INVASIVE CV LAB;  Service: Cardiovascular;  Laterality: N/A;   EYE SURGERY     LEFT AND RIGHT HEART CATHETERIZATION WITH CORONARY/GRAFT ANGIOGRAM N/A 01/26/2015   Procedure: LEFT AND RIGHT HEART CATHETERIZATION WITH Isabel Caprice;  Surgeon: Micheline Chapman, MD;  Location: [facility] CATH LAB;  Service: Cardiovascular;  Laterality: N/A;   TEE WITHOUT CARDIOVERSION  N/A 03/10/2015   Procedure: TRANSESOPHAGEAL ECHOCARDIOGRAM (TEE);  Surgeon: [name], MD;  Location: [facility] OR;  Service: Open Heart Surgery;  Laterality: N/A;   TONSILLECTOMY     TRANSCATHETER AORTIC VALVE REPLACEMENT, TRANSFEMORAL N/A 03/10/2015   Procedure: TRANSCATHETER AORTIC VALVE REPLACEMENT, TRANSFEMORAL;  Surgeon: [name], MD;  Location: [facility] OR;  Service: Open Heart Surgery;  Laterality: N/A;    Current Medications: Current Meds  Medication Sig   acetaminophen (TYLENOL) 325 MG tablet Take 1-2 tablets (325-650 mg total) by mouth every 4 (four) hours as needed for mild pain.   albuterol (VENTOLIN HFA) 108 (90 Base) MCG/ACT inhaler INHALE 2 PUFFS BY MOUTH EVERY 4 HOURS ASNEEDED FOR WHEEZING OR SHORTNESS OF BREATH.   amiodarone (PACERONE) 200 MG tablet Take 1 tablet by mouth daily Monday - Friday.   amoxicillin (AMOXIL) 500 MG capsule TAKE FOUR CAPSULES BY MOUTH ONE hour prior TO dental procedure   Ascorbic Acid (VITAMIN C) 1000 MG tablet Take 1,000 mg by mouth daily.   aspirin EC 81 MG tablet Take 1 tablet (81 mg total) by mouth daily. Swallow whole.   atorvastatin (LIPITOR) 80 MG tablet TAKE 1 TABLET BY MOUTH EVERY DAY   benzonatate (TESSALON) 100 MG capsule Take 1 capsule (100 mg total) by mouth 2 (two) times daily as needed for cough.   diclofenac Sodium (VOLTAREN) 1 % GEL Apply 4 g topically 4 (four) times daily.   ezetimibe (ZETIA) 10 MG tablet Take 1 tablet (10 mg total) by mouth daily.   Ferrous Sulfate (SLOW FE PO) Take by mouth as needed.   fexofenadine (ALLEGRA ALLERGY) 60 MG tablet Take 1 tablet (60 mg total) by mouth 2 (two) times daily.   furosemide (LASIX) 20 MG tablet Take 1 tablet (20 mg total) by mouth daily as needed. For swelling in the legs, shortness of breath or if your weight goes up by 2 lbs overnight. Call cardiology for input.   JARDIANCE 10 MG TABS tablet TAKE 1 TABLET BY MOUTH EVERY DAY   loratadine (CLARITIN) 10 MG tablet Take 10 mg by mouth daily as  needed for allergies.   melatonin 5 MG TABS Take 1 tablet (5 mg total) by mouth at bedtime as needed (sleep).   Multiple Vitamins-Minerals (MULTIVITAMIN,TX-MINERALS) tablet Take 1 tablet by mouth daily.   nitroGLYCERIN (NITROSTAT) 0.4 MG SL tablet Place 1 tablet (0.4 mg total) under the tongue every 5 (five) minutes x 3 doses as needed for chest pain.   valsartan (DIOVAN) 80 MG tablet Take 1.5 tablets (120 mg total) by mouth daily. (Patient taking differently: Take 80 mg by mouth daily.)   warfarin (COUMADIN) 4 MG tablet TAKE 1 TABLET BY MOUTH DAILY EXCEPT TAKE 1/2 TABLET ON MONDAYS AND FRIDAYS OR AS DIRECTED BY ANTICOAGULATION CLINIC   [DISCONTINUED] clopidogrel (PLAVIX) 75 MG tablet TAKE 1 TABLET BY MOUTH DAILY WITH BREAKFAST.     Allergies:   Amlodipine, Codeine, and Iodine   Social History   Socioeconomic History   Marital status: Married  Spouse name: Not on file   Number of children: Not on file   Years of education: Not on file   Highest education level: Not on file  Occupational History   Not on file  Tobacco Use   Smoking status: Never   Smokeless tobacco: Never   Tobacco comments:    Does not smoke.  Vaping Use   Vaping status: Never Used  Substance and Sexual Activity   Alcohol use: No    Alcohol/week: 0.0 standard drinks of alcohol   Drug use: No   Sexual activity: Not Currently  Other Topics Concern   Not on file  Social History Narrative   HSG. [employer] - [employer] 6 years. Married - '69 - 1 year/divorced. '76 - . No children. Work - mfg/textiles - Audiological scientist; currently works doing maintenance. [employer] - they have discussed this. Provided packet august '13.                Social Determinants of Health   Financial Resource Strain: Low Risk  (01/24/2023)   Overall Financial Resource Strain (CARDIA)    Difficulty of Paying Living Expenses: Not very hard  Food Insecurity: No Food Insecurity (01/24/2023)   Hunger Vital Sign    Worried About Running Out of  Food in the Last Year: Never true    Ran Out of Food in the Last Year: Never true  Transportation Needs: No Transportation Needs (01/24/2023)   PRAPARE - Administrator, Civil Service (Medical): No    Lack of Transportation (Non-Medical): No  Physical Activity: Sufficiently Active (07/28/2022)   Exercise Vital Sign    Days of Exercise per Week: 3 days    Minutes of Exercise per Session: 60 min  Stress: No Stress Concern Present (07/28/2022)   [employer] of Occupational Health - Occupational Stress Questionnaire    Feeling of Stress : Not at all  Social Connections: Moderately Integrated (07/28/2022)   Social Connection and Isolation Panel [NHANES]    Frequency of Communication with Friends and Family: More than three times a week    Frequency of Social Gatherings with Friends and Family: More than three times a week    Attends Religious Services: More than 4 times per year    Active Member of [employer] or Organizations: Yes    Attends Banker Meetings: More than 4 times per year    Marital Status: Widowed     Family History: The patient's family history includes Atrial fibrillation in his brother; Crohn's disease in his mother; Heart attack (age of onset: 38) in his father; Heart disease in his father and paternal uncle; Heart failure in his father; Prostate cancer in his paternal uncle; Stroke (age of onset: 6) in his mother. There is no history of Colon cancer.  ROS:   Please see the history of present illness.    All other systems reviewed and are negative.  EKGs/Labs/Other Studies Reviewed:    The following studies were reviewed today:  EKG Interpretation Date/Time:  Thursday June 15 2023 14:06:40 EDT Ventricular Rate:  54 PR Interval:  190 QRS Duration:  172 QT Interval:  488 QTC Calculation: 462 R Axis:   87  Text Interpretation: Sinus bradycardia Non-specific intra-ventricular conduction block Left ventricular hypertrophy with repolarization  abnormality ( Sokolow-Lyon ) When compared with ECG of 21-Mar-2022 15:13, Non-specific intra-ventricular conduction block has replaced Left bundle branch block Confirmed by [name] [phone]) on 06/15/2023 2:31:33 PM    Recent Labs: 06/05/2023: ALT  24; BUN 21; Creatinine, Ser 1.14; Hemoglobin 12.9; Platelets 149; Potassium 4.3; Sodium 142; TSH 5.030  Recent Lipid Panel    Component Value Date/Time   CHOL 135 06/05/2023 0928   TRIG 90 06/05/2023 0928   TRIG 67 11/20/2006 0748   HDL 50 06/05/2023 0928   CHOLHDL 2.7 06/05/2023 0928   CHOLHDL 3 09/01/2022 0726   VLDL 16.6 09/01/2022 0726   LDLCALC 68 06/05/2023 0928     Risk Assessment/Calculations:    CHA2DS2-VASc Score = 5   This indicates a 7.2% annual risk of stroke. The patient's score is based upon: CHF History: 1 HTN History: 1 Diabetes History: 0 Stroke History: 0 Vascular Disease History: 1 Age Score: 2 Gender Score: 0               Physical Exam:    VS:  BP 110/60   Pulse (!) 54   Ht 5\' 1"  (1.549 m)   Wt 121 lb 9.6 oz (55.2 kg)   SpO2 95%   BMI 22.98 kg/m     Wt Readings from Last 3 Encounters:  06/15/23 121 lb 9.6 oz (55.2 kg)  03/02/23 119 lb 9.6 oz (54.3 kg)  01/16/23 121 lb 9.6 oz (55.2 kg)     GEN:  Well nourished, well developed elderly male in no acute distress HEENT: Normal NECK: No JVD; No carotid bruits LYMPHATICS: No lymphadenopathy CARDIAC: RRR, 2/6 early peaking ejection murmur at the right upper sternal border with 2/6 diastolic decrescendo murmur at the right upper sternal border RESPIRATORY:  Clear to auscultation without rales, wheezing or rhonchi  ABDOMEN: Soft, non-tender, non-distended MUSCULOSKELETAL: Trace bilateral pretibial edema; No deformity  SKIN: Warm and dry, ecchymoses of both forearms present NEUROLOGIC:  Alert and oriented x 3 PSYCHIATRIC:  Normal affect   ASSESSMENT:    1. Coronary artery disease involving native heart without angina pectoris, unspecified  vessel or lesion type   2. Chronic systolic CHF (congestive heart failure) (HCC)   3. Persistent atrial fibrillation (HCC)   4. Essential hypertension   5. Hyperlipidemia LDL goal <70   6. 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     PLAN:    In order of problems listed above:  The patient is doing well without angina.  He is now greater than a year out from saphenous vein graft PCI.  Will discontinue clopidogrel.  I think with degenerative vein graft disease and history of vein graft stenting, he should remain on antiplatelet therapy.  He will start aspirin 81 mg daily. Stable with good functional capacity.  Continue Jardiance, valsartan.  Not a candidate for beta-blocker due to bradycardia. Maintaining sinus rhythm on amiodarone.  He is taking 200 mg Monday through Friday.  His TSH is out of range, slightly elevated at 5.03.  Will check a free T4 today.  Other labs surveillance looks okay.  Continue current management.  Anticoagulated with warfarin. Blood pressure well-controlled on current medicines.  Recent labs reviewed. Treated with atorvastatin 80 mg daily.  Cholesterol is 135, LDL 68. The patient has normal transvalvular gradients with mild paravalvular regurgitation, audible on his physical exam.  Will check an echocardiogram prior to his next office visit in 6 months.  Overall the patient appears clinically stable from a cardiovascular perspective.  Today, we are discontinuing clopidogrel, having him start on aspirin 81 mg daily, and checking a free T4 to follow-up his thyroid function testing in the setting of chronic oral amiodarone.  He sees Dr. Graciela Husbands back next  month due to his history of ventricular tachycardia.     Medication Adjustments/Labs and Tests Ordered: Current medicines are reviewed at length with the patient today.  Concerns regarding medicines are outlined above.  Orders Placed This Encounter  Procedures   EKG 12-Lead   ECHOCARDIOGRAM COMPLETE    Meds ordered this encounter  Medications   aspirin EC 81 MG tablet    Sig: Take 1 tablet (81 mg total) by mouth daily. Swallow whole.    Dispense:  100 tablet    Refill:  0    Patient Instructions  Medication Instructions:  STOP Clopidogrel/Plavix START Aspirin 81mg  daily *If you need a refill on your cardiac medications before your next appointment, please call your pharmacy*   Lab Work: NONE (free T4 already ordered) If you have labs (blood work) drawn today and your tests are completely normal, you will receive your results only by: MyChart Message (if you have MyChart) OR A paper copy in the mail If you have any lab test that is abnormal or we need to change your treatment, we will call you to review the results.  Testing/Procedures: ECHO (prior to next visit in 6 months) Your physician has requested that you have an echocardiogram. Echocardiography is a painless test that uses sound waves to create images of your heart. It provides your doctor with information about the size and shape of your heart and how well your heart's chambers and valves are working. This procedure takes approximately one hour. There are no restrictions for this procedure. Please do NOT wear cologne, perfume, aftershave, or lotions (deodorant is allowed). Please arrive 15 minutes prior to your appointment time.  Follow-Up: At [facility], you and your health needs are our priority.  As part of our continuing mission to provide you with exceptional heart care, we have created designated Provider Care Teams.  These Care Teams include your primary Cardiologist (physician) and Advanced Practice Providers (APPs -  Physician Assistants and Nurse Practitioners) who all work together to provide you with the care you need, when you need it.  We recommend signing up for the patient portal called "MyChart".  Sign up information is provided on this After Visit Summary.  MyChart is used to connect with  patients for Virtual Visits (Telemedicine).  Patients are able to view lab/test results, encounter notes, upcoming appointments, etc.  Non-urgent messages can be sent to your provider as well.   To learn more about what you can do with MyChart, go to [email or web address].    Your next appointment:   6 month(s)  Provider:   [name], MD        Signed, [name], MD  06/15/2023 5:09 PM    [facility]

## 2023-06-16 LAB — T4, FREE: Free T4: 1.2 ng/dL (ref 0.82–1.77)

## 2023-06-20 ENCOUNTER — Ambulatory Visit: Payer: Medicare Other

## 2023-06-20 DIAGNOSIS — Z7901 Long term (current) use of anticoagulants: Secondary | ICD-10-CM

## 2023-06-20 LAB — POCT INR: INR: 3 (ref 2.0–3.0)

## 2023-06-20 NOTE — Progress Notes (Addendum)
Cardiology d/c Plavix and added 81 mg aspirin at pt's last OV. Continue 1 tablet daily except take 1/2 tablet on Mondays and Fridays. Recheck in 6 weeks.   Pt requests BP checked today. He checks at home and keeps a log. BP today is 138/60, 55 HR ,  95 O2 Sat %.

## 2023-06-20 NOTE — Patient Instructions (Addendum)
Pre visit review using our clinic review tool, if applicable. No additional management support is needed unless otherwise documented below in the visit note.  Continue 1 tablet daily except take 1/2 tablet on Mondays and Fridays. Recheck in 6 weeks.  

## 2023-06-21 ENCOUNTER — Telehealth: Payer: Self-pay | Admitting: Cardiovascular Disease

## 2023-06-21 NOTE — Telephone Encounter (Signed)
Returned pts call. Pt was returning call were voicemail was left yesterday. Pt could not access voice mail. Gave pt results. Pt stated understanding.

## 2023-06-21 NOTE — Telephone Encounter (Signed)
Patient is returning call in regards to results. Requesting return call.  

## 2023-06-28 ENCOUNTER — Telehealth: Payer: Self-pay | Admitting: Cardiovascular Disease

## 2023-06-28 ENCOUNTER — Other Ambulatory Visit: Payer: Self-pay | Admitting: Family Medicine

## 2023-06-28 ENCOUNTER — Other Ambulatory Visit: Payer: Self-pay | Admitting: Cardiovascular Disease

## 2023-06-28 NOTE — Telephone Encounter (Signed)
Pt c/o medication issue:  1. Name of Medication:   amoxicillin (AMOXIL) 500 MG capsule    2. How are you currently taking this medication (dosage and times per day)?   3. Are you having a reaction (difficulty breathing--STAT)? No  4. What is your medication issue? Germain Osgood from [facility] is calling because we typically prescribe this medication with only 4 tablets at a time. Germain Osgood is requesting we refill this medication with multiple refills of 20+ tablets at a time. Germain Osgood gave [place] a phone number of [phone] and a fax number of [phone]. Germain Osgood mentioned if we needed any paperwork from Dr. Marlin Canary at their office, they would be able to send it to our office. Please advise.

## 2023-06-30 MED ORDER — AMOXICILLIN 500 MG PO CAPS: ORAL_CAPSULE | ORAL | 0 refills | Status: DC

## 2023-06-30 NOTE — Telephone Encounter (Signed)
Reached out to patient to discuss if he still needs a refill as a rx was sent in for him in May this year with 3 refills-he has used these up as he's had several dental visits this year. Will send in rx for 12 capsules to be dispensed with zero refills.

## 2023-07-04 ENCOUNTER — Encounter: Payer: Self-pay | Admitting: Internal Medicine

## 2023-07-04 ENCOUNTER — Ambulatory Visit: Payer: Medicare Other | Attending: Internal Medicine | Admitting: Internal Medicine

## 2023-07-04 VITALS — BP 140/58 | HR 51 | Ht 61.0 in | Wt 121.0 lb

## 2023-07-04 DIAGNOSIS — I4819 Other persistent atrial fibrillation: Secondary | ICD-10-CM | POA: Diagnosis not present

## 2023-07-04 DIAGNOSIS — Z79899 Other long term (current) drug therapy: Secondary | ICD-10-CM

## 2023-07-04 DIAGNOSIS — R0989 Other specified symptoms and signs involving the circulatory and respiratory systems: Secondary | ICD-10-CM

## 2023-07-04 MED ORDER — AMIODARONE HCL 200 MG PO TABS: 100.0000 mg | ORAL_TABLET | Freq: Every day | ORAL | Status: DC

## 2023-07-04 NOTE — Progress Notes (Signed)
Patient Care Team: Kristian Covey, MD as PCP - General (Family Medicine) [name], MD as PCP - Cardiology (Cardiology) Duke Salvia, MD as PCP - Electrophysiology (Cardiology) Artas, Milas Kocher, [facility] (Inactive) (Pharmacist)   HPI  Jesus [name] is a 82 y.o. 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  Ischemic cardiomyopathy with remote CABG, valvular heart disease and remote TAVR.  A Atrial fibrillation for which  amiodarone; this was uptitrated in the setting of his ventricular tachycardia with its subsequent obliteration.  anticoagulation with warfarin   The patient denies chest pain, shortness of breath, nocturnal dyspnea, orthopnea or peripheral edema.  There have been no palpitations, lightheadedness or syncope.       DATE TEST EF   6/*22 Echo  45-50% Prior IMI  3/13 [facility]    % LIMA-LADp; SVG-OM1-OM2-OM3p SVG-RVM-p SVG-PDA 80%>Stent   3/23 Echo  30-35% TAVR functioning well         Date Cr K Hgb TSH LFTs  12/22    4.32   4/23 1.33 4.8 12.5  2.65 22   7/24 1.14 4.3 12.9 5.03 24     Records and Results Reviewed   Past Medical History:  Diagnosis Date   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 S/P Ant MI 1980;  b. 1997 S/P CABG x 8 (LIMA to diag-LAD, SVG to OM1-OM2-OM3, SVG to [facility] - Dr Andrey Campanile);  c. 01/2015 Cath: 3VD, 8/8 patent grafts.   Cardiomyopathy    Dysrhythmia    Esophageal reflux    GERD (gastroesophageal reflux disease)    Headache    History of colonoscopy    History of transesophageal echocardiography (TEE) for monitoring    Other and unspecified hyperlipidemia    Paroxysmal atrial fibrillation (HCC) 02/20/2022   S/P angioplasty with  stent 02/17/22 DES to proximal VG to PDA 02/20/2022   S/P TAVR (transcatheter aortic valve replacement)    a. 03/2015 26 mm Edwards Sapien 3 transcatheter heart valve placed via open left transfemoral approach.   Severe aortic stenosis 08/10/2012   Skin lesions, generalized    facial which may represent actinic keratoses and possible photosensitivity from Amiodarone   Unspecified essential hypertension     Past Surgical History:  Procedure Laterality Date   CARDIAC CATHETERIZATION     CARDIOVERSION  11/18/2006   Dr. Jacklynn Bue   CATARACT EXTRACTION W/ INTRAOCULAR LENS IMPLANT  April '13  (Dr. Dagoberto Ligas)   left eye only   CHOLECYSTECTOMY     CORONARY ARTERY BYPASS GRAFT  02/09/1996   LIMA to diag-LAD, SVG to OM1-OM2-OM3, SVG to [facility]   CORONARY STENT INTERVENTION N/A 02/17/2022   Procedure: CORONARY STENT INTERVENTION;  Surgeon: Kathleene Hazel, MD;  Location: [facility] INVASIVE CV LAB;  Service: Cardiovascular;  Laterality: N/A;   CORONARY/GRAFT ANGIOGRAPHY N/A 02/17/2022   Procedure: CORONARY/GRAFT ANGIOGRAPHY;  Surgeon: Kathleene Hazel, MD;  Location: [facility] INVASIVE CV LAB;  Service: Cardiovascular;  Laterality: N/A;   EYE SURGERY     LEFT AND RIGHT HEART CATHETERIZATION WITH CORONARY/GRAFT ANGIOGRAM N/A 01/26/2015   Procedure: LEFT AND RIGHT HEART CATHETERIZATION WITH Isabel Caprice;  Surgeon: Micheline Chapman, MD;  Location:  [facility] CATH LAB;  Service: Cardiovascular;  Laterality: N/A;   TEE WITHOUT CARDIOVERSION N/A 03/10/2015   Procedure: TRANSESOPHAGEAL ECHOCARDIOGRAM (TEE);  Surgeon: [name], MD;  Location: [facility] OR;  Service: Open Heart Surgery;  Laterality: N/A;   TONSILLECTOMY     TRANSCATHETER AORTIC VALVE REPLACEMENT, TRANSFEMORAL N/A 03/10/2015   Procedure: TRANSCATHETER AORTIC VALVE REPLACEMENT, TRANSFEMORAL;  Surgeon: [name], MD;  Location: [facility] OR;  Service: Open Heart Surgery;  Laterality: N/A;    No outpatient medications have been marked as taking for  the 07/04/23 encounter (Office Visit) with Duke Salvia, MD.    Allergies  Allergen Reactions   Amlodipine Other (See Comments)    Swelling?   Codeine Other (See Comments)    Pt does not remember   Iodine Swelling      Review of Systems negative except from HPI and PMH  Physical Exam BP (!) 140/58   Pulse (!) 51   Ht 5\' 1"  (1.549 m)   Wt 121 lb (54.9 kg)   SpO2 97%   BMI 22.86 kg/m  Well developed and well nourished in no acute distress HENT normal Neck supple with JVP-flat Crackles diffuse \\kyphosis  Regular rate and rhythm, no  gallop No  murmur Abd-soft with active BS No Clubbing cyanosis tr edema Skin-warm and dry A & Oriented  Grossly normal sensory and motor function  ECG sinus at 51 19/17/52 Incomplete left bundle branch block     CrCl cannot be calculated (Patient's most recent lab result is older than the maximum 21 days allowed.).   Assessment and  Plan 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-largely asymptomatic   Non-STEMI status post PCI   Sinus bradycardia   Ischemic cardiomyopathy with remote CABG   Valvular heart disease-TAVR 2016   Atrial fibrillation persistent    High Risk Medication Surveillance Amiodarone  Senile Purpura     No interval arrhythmias of which he is aware.  We will decrease his amiodarone from 1000--700 mg a week Diffuse dry crackles.  Chest x-ray 11/23 was unrevealing.  Will get a chest CT looking for pulmonary fibrosis  No symptoms of ischemia.   Continue aspirin Zetia and atorvastatin  With left ventricular dysfunction, continue valsartan and Jardiance.  Beta-blockers precluded by his bradycardia.  Could consider spironolactone or reach out to Dr. [facility]   Current medicines are reviewed at length with the patient today .  The patient does not  have concerns regarding medicines.

## 2023-07-04 NOTE — Patient Instructions (Addendum)
Medication Instructions:  Your physician has recommended you make the following change in your medication:   ** Decrease Amiodarone 200mg  to 1/2 tablet by mouth (100mg ) daily  *If you need a refill on your cardiac medications before your next appointment, please call your pharmacy*   Lab Work: TSH in 3 months  If you have labs (blood work) drawn today and your tests are completely normal, you will receive your results only by: MyChart Message (if you have MyChart) OR A paper copy in the mail If you have any lab test that is abnormal or we need to change your treatment, we will call you to review the results.   Testing/Procedures: CT of chest without contrast Non-Cardiac CT scanning, (CAT scanning), is a noninvasive, special x-ray that produces cross-sectional images of the body using x-rays and a computer. CT scans help physicians diagnose and treat medical conditions. For some CT exams, a contrast material is used to enhance visibility in the area of the body being studied. CT scans provide greater clarity and reveal more details than regular x-ray exams.    Follow-Up: At [facility], you and your health needs are our priority.  As part of our continuing mission to provide you with exceptional heart care, we have created designated Provider Care Teams.  These Care Teams include your primary Cardiologist (physician) and Advanced Practice Providers (APPs -  Physician Assistants and Nurse Practitioners) who all work together to provide you with the care you need, when you need it.  We recommend signing up for the patient portal called "MyChart".  Sign up information is provided on this After Visit Summary.  MyChart is used to connect with patients for Virtual Visits (Telemedicine).  Patients are able to view lab/test results, encounter notes, upcoming appointments, etc.  Non-urgent messages can be sent to your provider as well.   To learn more about what you can do with MyChart, go  to [email or web address].    Your next appointment:   6 months with Dr Graciela Husbands

## 2023-07-05 ENCOUNTER — Telehealth: Payer: Self-pay | Admitting: Internal Medicine

## 2023-07-05 NOTE — Telephone Encounter (Signed)
[facility] contacted [place] to report patient told them he has been taking amiodarone 200 mg 2 tablets 2 times daily. [facility] to clarify dosage. Reviewed [facility], take amiodarone 200 mg 1/2 tablet (100 mg) daily.   Spoke with patient who states he did take it 2 tablets 2 times daily for the first seven days. Then he had been taking amiodarone 200 mg 1 tablet 2 times daily. He  states he only took 1/2 tablet ( 100 mg ) today. Advised to continue taking 1/2 tablet (100 mg ) daily and I would send a message to Dr Graciela Husbands to confirm the dose.   Patient verbalized understanding and had no questions.

## 2023-07-05 NOTE — Telephone Encounter (Signed)
Pt c/o medication issue:  1. Name of Medication:   amiodarone (PACERONE) 200 MG tablet    2. How are you currently taking this medication (dosage and times per day)?  Take 0.5 tablets (100 mg total) by mouth daily.       3. Are you having a reaction (difficulty breathing--STAT)? No  4. What is your medication issue? Mia from [facility] is requesting a callback regarding this medication after finding out pt has been taking it incorrectly. Pt has been taking this medication twice a day. Please advise

## 2023-07-06 NOTE — Telephone Encounter (Signed)
Attempted phone call to pt and left voicemail message to call RN at [phone].

## 2023-07-06 NOTE — Telephone Encounter (Signed)
Spoke with pt who understands per Dr Graciela Husbands on 07/04/23 he is now to be taking Amiodarone 200mg  -1/2 tablet (100mg ) by mouth daily.  Pt thanked Charity fundraiser for the call.  Nothing further needed at this time.

## 2023-07-07 ENCOUNTER — Other Ambulatory Visit: Payer: Self-pay

## 2023-07-07 ENCOUNTER — Inpatient Hospital Stay (HOSPITAL_COMMUNITY)
Admission: EM | Admit: 2023-07-07 | Discharge: 2023-07-16 | DRG: 394 | Disposition: A | Discharge status: Skilled Nursing Facility | Payer: Medicare Other | Attending: Internal Medicine | Admitting: Internal Medicine

## 2023-07-07 ENCOUNTER — Encounter (HOSPITAL_COMMUNITY): Payer: Self-pay | Admitting: Emergency Medicine

## 2023-07-07 DIAGNOSIS — Z885 Allergy status to narcotic agent status: Secondary | ICD-10-CM | POA: Diagnosis not present

## 2023-07-07 DIAGNOSIS — Z7984 Long term (current) use of oral hypoglycemic drugs: Secondary | ICD-10-CM | POA: Diagnosis not present

## 2023-07-07 DIAGNOSIS — K921 Melena: Secondary | ICD-10-CM | POA: Diagnosis present

## 2023-07-07 DIAGNOSIS — I429 Cardiomyopathy, unspecified: Secondary | ICD-10-CM | POA: Diagnosis not present

## 2023-07-07 DIAGNOSIS — Z7982 Long term (current) use of aspirin: Secondary | ICD-10-CM

## 2023-07-07 DIAGNOSIS — I447 Left bundle-branch block, unspecified: Secondary | ICD-10-CM | POA: Diagnosis present

## 2023-07-07 DIAGNOSIS — D689 Coagulation defect, unspecified: Secondary | ICD-10-CM | POA: Diagnosis present

## 2023-07-07 DIAGNOSIS — I25810 Atherosclerosis of coronary artery bypass graft(s) without angina pectoris: Secondary | ICD-10-CM | POA: Diagnosis not present

## 2023-07-07 DIAGNOSIS — Z888 Allergy status to other drugs, medicaments and biological substances status: Secondary | ICD-10-CM

## 2023-07-07 DIAGNOSIS — I2489 Other forms of acute ischemic heart disease: Secondary | ICD-10-CM | POA: Diagnosis present

## 2023-07-07 DIAGNOSIS — K922 Gastrointestinal hemorrhage, unspecified: Principal | ICD-10-CM | POA: Diagnosis present

## 2023-07-07 DIAGNOSIS — I5032 Chronic diastolic (congestive) heart failure: Secondary | ICD-10-CM | POA: Diagnosis not present

## 2023-07-07 DIAGNOSIS — Z823 Family history of stroke: Secondary | ICD-10-CM

## 2023-07-07 DIAGNOSIS — Z8042 Family history of malignant neoplasm of prostate: Secondary | ICD-10-CM

## 2023-07-07 DIAGNOSIS — D6959 Other secondary thrombocytopenia: Secondary | ICD-10-CM | POA: Diagnosis not present

## 2023-07-07 DIAGNOSIS — Z66 Do not resuscitate: Secondary | ICD-10-CM | POA: Diagnosis present

## 2023-07-07 DIAGNOSIS — N179 Acute kidney failure, unspecified: Secondary | ICD-10-CM | POA: Diagnosis present

## 2023-07-07 DIAGNOSIS — Z951 Presence of aortocoronary bypass graft: Secondary | ICD-10-CM | POA: Diagnosis not present

## 2023-07-07 DIAGNOSIS — R5381 Other malaise: Secondary | ICD-10-CM | POA: Diagnosis present

## 2023-07-07 DIAGNOSIS — I5022 Chronic systolic (congestive) heart failure: Secondary | ICD-10-CM | POA: Diagnosis present

## 2023-07-07 DIAGNOSIS — E785 Hyperlipidemia, unspecified: Secondary | ICD-10-CM | POA: Diagnosis not present

## 2023-07-07 DIAGNOSIS — I4819 Other persistent atrial fibrillation: Secondary | ICD-10-CM | POA: Diagnosis present

## 2023-07-07 DIAGNOSIS — Z95820 Peripheral vascular angioplasty status with implants and grafts: Secondary | ICD-10-CM

## 2023-07-07 DIAGNOSIS — K625 Hemorrhage of anus and rectum: Secondary | ICD-10-CM | POA: Diagnosis not present

## 2023-07-07 DIAGNOSIS — Z7901 Long term (current) use of anticoagulants: Secondary | ICD-10-CM

## 2023-07-07 DIAGNOSIS — K621 Rectal polyp: Principal | ICD-10-CM | POA: Diagnosis present

## 2023-07-07 DIAGNOSIS — Z79899 Other long term (current) drug therapy: Secondary | ICD-10-CM | POA: Diagnosis not present

## 2023-07-07 DIAGNOSIS — K648 Other hemorrhoids: Secondary | ICD-10-CM | POA: Diagnosis not present

## 2023-07-07 DIAGNOSIS — I110 Hypertensive heart disease with heart failure: Secondary | ICD-10-CM | POA: Diagnosis not present

## 2023-07-07 DIAGNOSIS — I739 Peripheral vascular disease, unspecified: Secondary | ICD-10-CM | POA: Diagnosis not present

## 2023-07-07 DIAGNOSIS — I480 Paroxysmal atrial fibrillation: Secondary | ICD-10-CM | POA: Diagnosis present

## 2023-07-07 DIAGNOSIS — I2510 Atherosclerotic heart disease of native coronary artery without angina pectoris: Secondary | ICD-10-CM | POA: Diagnosis not present

## 2023-07-07 DIAGNOSIS — Z91041 Radiographic dye allergy status: Secondary | ICD-10-CM | POA: Diagnosis not present

## 2023-07-07 DIAGNOSIS — K5730 Diverticulosis of large intestine without perforation or abscess without bleeding: Secondary | ICD-10-CM | POA: Diagnosis not present

## 2023-07-07 DIAGNOSIS — G4700 Insomnia, unspecified: Secondary | ICD-10-CM | POA: Diagnosis not present

## 2023-07-07 DIAGNOSIS — Z953 Presence of xenogenic heart valve: Secondary | ICD-10-CM

## 2023-07-07 DIAGNOSIS — D128 Benign neoplasm of rectum: Secondary | ICD-10-CM | POA: Diagnosis not present

## 2023-07-07 DIAGNOSIS — I10 Essential (primary) hypertension: Secondary | ICD-10-CM | POA: Diagnosis present

## 2023-07-07 DIAGNOSIS — I252 Old myocardial infarction: Secondary | ICD-10-CM | POA: Diagnosis not present

## 2023-07-07 DIAGNOSIS — Z8249 Family history of ischemic heart disease and other diseases of the circulatory system: Secondary | ICD-10-CM

## 2023-07-07 DIAGNOSIS — Z955 Presence of coronary angioplasty implant and graft: Secondary | ICD-10-CM

## 2023-07-07 DIAGNOSIS — K219 Gastro-esophageal reflux disease without esophagitis: Secondary | ICD-10-CM | POA: Diagnosis present

## 2023-07-07 DIAGNOSIS — D696 Thrombocytopenia, unspecified: Secondary | ICD-10-CM | POA: Diagnosis not present

## 2023-07-07 LAB — COMPREHENSIVE METABOLIC PANEL
ALT: 30 U/L (ref 0–44)
AST: 33 U/L (ref 15–41)
Albumin: 3.8 g/dL (ref 3.5–5.0)
Alkaline Phosphatase: 119 U/L (ref 38–126)
Anion gap: 10 (ref 5–15)
BUN: 18 mg/dL (ref 8–23)
CO2: 26 mmol/L (ref 22–32)
Calcium: 8.9 mg/dL (ref 8.9–10.3)
Chloride: 104 mmol/L (ref 98–111)
Creatinine, Ser: 1.27 mg/dL — ABNORMAL HIGH (ref 0.61–1.24)
GFR, Estimated: 56 mL/min — ABNORMAL LOW (ref >60)
Glucose, Bld: 112 mg/dL — ABNORMAL HIGH (ref 70–99)
Potassium: 4.5 mmol/L (ref 3.5–5.1)
Sodium: 140 mmol/L (ref 135–145)
Total Bilirubin: 1 mg/dL (ref 0.3–1.2)
Total Protein: 6.7 g/dL (ref 6.5–8.1)

## 2023-07-07 LAB — CBC
HCT: 41.7 % (ref 39.0–52.0)
Hemoglobin: 13.4 g/dL (ref 13.0–17.0)
MCH: 32.9 pg (ref 26.0–34.0)
MCHC: 32.1 g/dL (ref 30.0–36.0)
MCV: 102.5 fL — ABNORMAL HIGH (ref 80.0–100.0)
Platelets: 144 10*3/uL — ABNORMAL LOW (ref 150–400)
RBC: 4.07 MIL/uL — ABNORMAL LOW (ref 4.22–5.81)
RDW: 13.5 % (ref 11.5–15.5)
WBC: 6.7 10*3/uL (ref 4.0–10.5)
nRBC: 0 % (ref 0.0–0.2)

## 2023-07-07 LAB — TYPE AND SCREEN
ABO/RH(D): O POS
Antibody Screen: NEGATIVE

## 2023-07-07 LAB — PROTIME-INR
INR: 2.3 — ABNORMAL HIGH (ref 0.8–1.2)
Prothrombin Time: 25.5 seconds — ABNORMAL HIGH (ref 11.4–15.2)

## 2023-07-07 LAB — HEMOGLOBIN AND HEMATOCRIT, BLOOD
HCT: 43 % (ref 39.0–52.0)
Hemoglobin: 13.9 g/dL (ref 13.0–17.0)

## 2023-07-07 MED ORDER — PANTOPRAZOLE 80MG IVPB - SIMPLE MED
80.0000 mg | Freq: Once | INTRAVENOUS | Status: AC
  Administered 2023-07-07: 80 mg via INTRAVENOUS
  Filled 2023-07-07: qty 100

## 2023-07-07 MED ORDER — ONDANSETRON HCL 4 MG/2ML IJ SOLN: 4.0000 mg | Freq: Four times a day (QID) | INTRAMUSCULAR | Status: DC | PRN

## 2023-07-07 MED ORDER — ACETAMINOPHEN 325 MG PO TABS
650.0000 mg | ORAL_TABLET | Freq: Four times a day (QID) | ORAL | Status: DC | PRN
  Administered 2023-07-12 – 2023-07-13 (×2): 650 mg via ORAL
  Filled 2023-07-07 (×2): qty 2

## 2023-07-07 MED ORDER — IRBESARTAN 75 MG PO TABS
75.0000 mg | ORAL_TABLET | Freq: Every day | ORAL | Status: DC
  Administered 2023-07-08 – 2023-07-12 (×5): 75 mg via ORAL
  Filled 2023-07-07 (×5): qty 1

## 2023-07-07 MED ORDER — PANTOPRAZOLE INFUSION (NEW) - SIMPLE MED
8.0000 mg/h | INTRAVENOUS | Status: DC
  Administered 2023-07-07 – 2023-07-09 (×2): 8 mg/h via INTRAVENOUS
  Filled 2023-07-07 (×5): qty 100

## 2023-07-07 MED ORDER — ONDANSETRON HCL 4 MG PO TABS: 4.0000 mg | ORAL_TABLET | Freq: Four times a day (QID) | ORAL | Status: DC | PRN

## 2023-07-07 MED ORDER — ACETAMINOPHEN 650 MG RE SUPP: 650.0000 mg | Freq: Four times a day (QID) | RECTAL | Status: DC | PRN

## 2023-07-07 MED ORDER — INSULIN ASPART 100 UNIT/ML IJ SOLN
0.0000 [IU] | INTRAMUSCULAR | Status: DC
  Administered 2023-07-09 – 2023-07-10 (×2): 1 [IU] via SUBCUTANEOUS
  Administered 2023-07-12: 2 [IU] via SUBCUTANEOUS
  Administered 2023-07-14 – 2023-07-15 (×3): 1 [IU] via SUBCUTANEOUS

## 2023-07-07 MED ORDER — ATORVASTATIN CALCIUM 80 MG PO TABS
80.0000 mg | ORAL_TABLET | Freq: Every day | ORAL | Status: DC
  Administered 2023-07-08 – 2023-07-16 (×9): 80 mg via ORAL
  Filled 2023-07-07 (×9): qty 1

## 2023-07-07 MED ORDER — AMIODARONE HCL 200 MG PO TABS
100.0000 mg | ORAL_TABLET | Freq: Every day | ORAL | Status: DC
  Administered 2023-07-08 – 2023-07-16 (×9): 100 mg via ORAL
  Filled 2023-07-07 (×9): qty 1

## 2023-07-07 NOTE — ED Notes (Signed)
ED TO INPATIENT HANDOFF REPORT  ED Nurse Name and Phone #: Raquel Sarna [phone]  S Name/Age/Gender Jesus [name] 82 y.o. male Room/Bed: 019C/019C  Code Status   Code Status: DNR  Home/SNF/Other Home Patient oriented to: self, place, time, and situation Is this baseline? Yes   Triage Complete: Triage complete  Chief Complaint BRBPR (bright red blood per rectum) [K62.5]  Triage Note Pt presents with increasing frequency of BRB in stool.  His PCP recommended he come in for eval.  Pt notes there was a change in his meds recently but isn't sure which.  Pt has Plavix with him as part of his daily medicines but isn't sure if that's the one they changed or not.  No pain or complaints.    Allergies Allergies  Allergen Reactions   Amlodipine Other (See Comments)    Swelling?   Codeine Other (See Comments)    Pt does not remember   Iodine Swelling    Level of Care/Admitting Diagnosis ED Disposition     ED Disposition  Admit   Condition  --   Comment  Hospital Area: [facility] [[id number]]  Level of Care: Telemetry Medical [104]  May place patient in observation at [facility] or [facility] if equivalent level of care is available:: No  Covid Evaluation: Asymptomatic - no recent exposure (last 10 days) testing not required  Diagnosis: BRBPR (bright red blood per rectum) [244010]  Admitting Physician: Hillary Bow [phone]  Attending Physician: Hillary Bow [[id number]]          B Medical/Surgery History Past Medical History:  Diagnosis Date   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 S/P Ant MI 1980;  b. 1997 S/P CABG x 8 (LIMA to diag-LAD, SVG to OM1-OM2-OM3, SVG to [facility] - Dr Andrey Campanile);  c. 01/2015 Cath: 3VD, 8/8 patent grafts.   Cardiomyopathy     Dysrhythmia    Esophageal reflux    GERD (gastroesophageal reflux disease)    Headache    History of colonoscopy    History of transesophageal echocardiography (TEE) for monitoring    Other and unspecified hyperlipidemia    Paroxysmal atrial fibrillation (HCC) 02/20/2022   S/P angioplasty with stent 02/17/22 DES to proximal VG to PDA 02/20/2022   S/P TAVR (transcatheter aortic valve replacement)    a. 03/2015 26 mm Edwards Sapien 3 transcatheter heart valve placed via open left transfemoral approach.   Severe aortic stenosis 08/10/2012   Skin lesions, generalized    facial which may represent actinic keratoses and possible photosensitivity from Amiodarone   Unspecified essential hypertension    Past Surgical History:  Procedure Laterality Date   CARDIAC CATHETERIZATION     CARDIOVERSION  11/18/2006   Dr. Jacklynn Bue   CATARACT EXTRACTION W/ INTRAOCULAR LENS IMPLANT  April '13  (Dr. Dagoberto Ligas)   left eye only   CHOLECYSTECTOMY     CORONARY ARTERY BYPASS GRAFT  02/09/1996   LIMA to diag-LAD, SVG to OM1-OM2-OM3, SVG to [facility]   CORONARY STENT INTERVENTION N/A 02/17/2022   Procedure: CORONARY STENT INTERVENTION;  Surgeon: Kathleene Hazel, MD;  Location: [facility] INVASIVE CV LAB;  Service: Cardiovascular;  Laterality: N/A;   CORONARY/GRAFT ANGIOGRAPHY N/A 02/17/2022   Procedure: CORONARY/GRAFT ANGIOGRAPHY;  Surgeon: Verne Carrow  D, MD;  Location: [facility] INVASIVE CV LAB;  Service: Cardiovascular;  Laterality: N/A;   EYE SURGERY     LEFT AND RIGHT HEART CATHETERIZATION WITH CORONARY/GRAFT ANGIOGRAM N/A 01/26/2015   Procedure: LEFT AND RIGHT HEART CATHETERIZATION WITH Isabel Caprice;  Surgeon: Micheline Chapman, MD;  Location: [facility] CATH LAB;  Service: Cardiovascular;  Laterality: N/A;   TEE WITHOUT CARDIOVERSION N/A 03/10/2015   Procedure: TRANSESOPHAGEAL ECHOCARDIOGRAM (TEE);  Surgeon: [name], MD;  Location: [facility] OR;  Service: Open Heart Surgery;  Laterality: N/A;    TONSILLECTOMY     TRANSCATHETER AORTIC VALVE REPLACEMENT, TRANSFEMORAL N/A 03/10/2015   Procedure: TRANSCATHETER AORTIC VALVE REPLACEMENT, TRANSFEMORAL;  Surgeon: [name], MD;  Location: [facility] OR;  Service: Open Heart Surgery;  Laterality: N/A;     A IV Location/Drains/Wounds Patient Lines/Drains/Airways Status     Active Line/Drains/Airways     Name Placement date Placement time Site Days   Peripheral IV 07/07/23 20 G 1" Anterior;Left;Proximal Forearm 07/07/23  2054  Forearm  less than 1            Intake/Output Last 24 hours No intake or output data in the 24 hours ending 07/07/23 2243  Labs/Imaging Results for orders placed or performed during the hospital encounter of 07/07/23 (from the past 48 hour(s))  Comprehensive metabolic panel     Status: Abnormal   Collection Time: 07/07/23  5:38 PM  Result Value Ref Range   Sodium 140 135 - 145 mmol/L   Potassium 4.5 3.5 - 5.1 mmol/L   Chloride 104 98 - 111 mmol/L   CO2 26 22 - 32 mmol/L   Glucose, Bld 112 (H) 70 - 99 mg/dL    Comment: Glucose reference range applies only to samples taken after fasting for at least 8 hours.   BUN 18 8 - 23 mg/dL   Creatinine, Ser 1.61 (H) 0.61 - 1.24 mg/dL   Calcium 8.9 8.9 - 09.6 mg/dL   Total Protein 6.7 6.5 - 8.1 g/dL   Albumin 3.8 3.5 - 5.0 g/dL   AST 33 15 - 41 U/L   ALT 30 0 - 44 U/L   Alkaline Phosphatase 119 38 - 126 U/L   Total Bilirubin 1.0 0.3 - 1.2 mg/dL   GFR, Estimated 56 (L) >60 mL/min    Comment: (NOTE) Calculated using the CKD-EPI Creatinine Equation (2021)    Anion gap 10 5 - 15    Comment: Performed at [facility] Lab, [address], [place], [place] [address]  CBC     Status: Abnormal   Collection Time: 07/07/23  5:38 PM  Result Value Ref Range   WBC 6.7 4.0 - 10.5 K/uL   RBC 4.07 (L) 4.22 - 5.81 MIL/uL   Hemoglobin 13.4 13.0 - 17.0 g/dL   HCT 98.1 19.1 - 47.8 %   MCV 102.5 (H) 80.0 - 100.0 fL   MCH 32.9 26.0 - 34.0 pg   MCHC 32.1 30.0 - 36.0 g/dL   RDW  29.5 62.1 - 30.8 %   Platelets 144 (L) 150 - 400 K/uL   nRBC 0.0 0.0 - 0.2 %    Comment: Performed at [facility], [address], [place], [place] [address]  Type and screen [facility]     Status: None   Collection Time: 07/07/23  5:45 PM  Result Value Ref Range   ABO/RH(D) O POS    Antibody Screen NEG    Sample Expiration      07/10/2023,2359  Performed at [facility], [address] [place], [place] [address]   Protime-INR     Status: Abnormal   Collection Time: 07/07/23  8:45 PM  Result Value Ref Range   Prothrombin Time 25.5 (H) 11.4 - 15.2 seconds   INR 2.3 (H) 0.8 - 1.2    Comment: (NOTE) INR goal varies based on device and disease states. Performed at [facility], [address], [place], [place] [address]   Hemoglobin and hematocrit, blood     Status: None   Collection Time: 07/07/23  8:45 PM  Result Value Ref Range   Hemoglobin 13.9 13.0 - 17.0 g/dL   HCT 28.4 13.2 - 44.0 %    Comment: Performed at [facility] Lab, [address] [place], [place] [address]   No results found.  Pending Labs Unresulted Labs (From admission, onward)     Start     Ordered   07/08/23 0500  CBC  Tomorrow morning,   R        07/07/23 2214   07/08/23 0500  Basic metabolic panel  Tomorrow morning,   R        07/07/23 2214            Vitals/Pain Today's Vitals   07/07/23 1720 07/07/23 1725 07/07/23 1915 07/07/23 2203  BP: (!) 182/57  (!) 175/60 (!) 167/61  Pulse: (!) 56  (!) 53 (!) 56  Resp: 19  18 18   Temp: 97.8 F (36.6 C)  98.2 F (36.8 C) 98.2 F (36.8 C)  TempSrc: Oral   Oral  SpO2: 98%  100% 99%  PainSc:  0-No pain      Isolation Precautions No active isolations  Medications Medications  pantoprozole (PROTONIX) 80 mg /NS 100 mL infusion (8 mg/hr Intravenous New Bag/Given 07/07/23 2202)  amiodarone (PACERONE) tablet 100 mg (has no administration in time range)  irbesartan (AVAPRO) tablet 75 mg (has no administration in  time range)  atorvastatin (LIPITOR) tablet 80 mg (has no administration in time range)  acetaminophen (TYLENOL) tablet 650 mg (has no administration in time range)    Or  acetaminophen (TYLENOL) suppository 650 mg (has no administration in time range)  ondansetron (ZOFRAN) tablet 4 mg (has no administration in time range)    Or  ondansetron (ZOFRAN) injection 4 mg (has no administration in time range)  pantoprazole (PROTONIX) 80 mg /NS 100 mL IVPB (80 mg Intravenous New Bag/Given 07/07/23 2158)    Mobility walks     Focused Assessments Cardiac Assessment Handoff:    No results found for: "CKTOTAL", "CKMB", "CKMBINDEX", "TROPONINI" No results found for: "DDIMER" Does the Patient currently have chest pain? No    R Recommendations: See Admitting Provider Note  Report given to:   Additional Notes: Pt a/o x 4 able to ambulate

## 2023-07-07 NOTE — Assessment & Plan Note (Addendum)
Hold coumadin in setting of GIB Cont amiodarone

## 2023-07-07 NOTE — Assessment & Plan Note (Signed)
ICM with baseline EF of 35% Cont ARB Not on any diuretics chronically it looks like.

## 2023-07-07 NOTE — Assessment & Plan Note (Signed)
In patient on chronic coumadin.  INR 2.3 today. Was on plavix previously, this was switched to ASA earlier last month due to being > 1 year out from DES. HGB stable thus far in ED (>13 x2). EDP d/w LBGI: Hold coumadin but not reversing with Vit K at this point Will see tomorrow Clear liquid diet Repeat CBC in AM Type and screen done Tele monitor Holding coumadin and ASA.

## 2023-07-07 NOTE — H&P (Signed)
History and Physical    Patient: Jesus [name] [id number] DOB: [birth year] DOA: 07/07/2023 DOS: the patient was seen and examined on 07/07/2023 PCP: Kristian Covey, MD  Patient coming from: Home  Chief Complaint:  Chief Complaint  Patient presents with   Rectal Bleeding   HPI: Jesus [name] is a 82 y.o. male with medical history significant of PAF on coumadin and amiodarone, remote h/o TAVR, CAD s/p CABG 1997.  Pt in to ED with onset of BRBPR, started noticing this several days ago with small amount in stool, then this began to worsen today and become more frequent.  No dizziness, weakness, CP, SOB.  Outpt provider sent him to ED for evaluation.  Colonoscopy with LBGI 2017 = 2 polyps.   Review of Systems: As mentioned in the history of present illness. All other systems reviewed and are negative. 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 S/P Ant MI 1980;  b. 1997 S/P CABG x 8 (LIMA to diag-LAD, SVG to OM1-OM2-OM3, SVG to [facility] - Dr Andrey Campanile);  c. 01/2015 Cath: 3VD, 8/8 patent grafts.   Cardiomyopathy    Dysrhythmia    Esophageal reflux    GERD (gastroesophageal reflux disease)    Headache    History of colonoscopy    History of transesophageal echocardiography (TEE) for monitoring    Other and unspecified hyperlipidemia    Paroxysmal atrial fibrillation (HCC) 02/20/2022   S/P angioplasty with stent 02/17/22 DES to proximal VG to PDA 02/20/2022   S/P TAVR (transcatheter aortic valve replacement)    a. 03/2015 26 mm Edwards Sapien 3 transcatheter heart valve placed via open left transfemoral approach.   Severe aortic stenosis 08/10/2012   Skin lesions, generalized    facial which may represent actinic keratoses and possible photosensitivity  from Amiodarone   Unspecified essential hypertension    Past Surgical History:  Procedure Laterality Date   CARDIAC CATHETERIZATION     CARDIOVERSION  11/18/2006   Dr. Jacklynn Bue   CATARACT EXTRACTION W/ INTRAOCULAR LENS IMPLANT  April '13  (Dr. Dagoberto Ligas)   left eye only   CHOLECYSTECTOMY     CORONARY ARTERY BYPASS GRAFT  02/09/1996   LIMA to diag-LAD, SVG to OM1-OM2-OM3, SVG to [facility]   CORONARY STENT INTERVENTION N/A 02/17/2022   Procedure: CORONARY STENT INTERVENTION;  Surgeon: Kathleene Hazel, MD;  Location: [facility]VAS[facility] CV LAB;  Service: Cardiovascular;  Laterality: N/A;   CORONARY/GRAFT ANGIOGRAPHY N/A 02/17/2022   Procedure: CORONARY/GRAFT ANGIOGRAPHY;  Surgeon: Kathleene Hazel, MD;  Location: [facility] CV LAB;  Service: Cardiovascular;  Laterality: N/A;   EYE SURGERY     LEFT AND RIGHT HEART CATHETERIZATION WITH CORONARY/GRAFT ANGIOGRAM N/A 01/26/2015   Procedure: LEFT AND RIGHT HEART CATHETERIZATION WITH Isabel Caprice;  Surgeon: Micheline Chapman, MD;  Location: [facility] CATH LAB;  Service: Cardiovascular;  Laterality: N/A;   TEE WITHOUT CARDIOVERSION N/A 03/10/2015   Procedure: TRANSESOPHAGEAL ECHOCARDIOGRAM (TEE);  Surgeon: [name], MD;  Location: [facility] OR;  Service: Open Heart Surgery;  Laterality: N/A;   TONSILLECTOMY     TRANSCATHETER AORTIC VALVE REPLACEMENT, TRANSFEMORAL N/A 03/10/2015   Procedure: TRANSCATHETER AORTIC VALVE REPLACEMENT, TRANSFEMORAL;  Surgeon: [name], MD;  Location: [facility] OR;  Service: Open  Heart Surgery;  Laterality: N/A;   Social History:  reports that he has never smoked. He has never used smokeless tobacco. He reports that he does not drink alcohol and does not use drugs.  Allergies  Allergen Reactions   Amlodipine Other (See Comments)    Swelling?   Codeine Other (See Comments)    Pt does not remember   Iodine Swelling    Family History  Problem Relation Age of Onset   Stroke Mother 67       Deceased   Crohn's  disease Mother        Deceased   Heart attack Father 75       Deceased   Heart disease Father    Heart failure Father        Deceased   Atrial fibrillation Brother    Heart disease Paternal Uncle    Prostate cancer Paternal Uncle    Colon cancer Neg Hx     Prior to Admission medications   Medication Sig Start Date End Date Taking? Authorizing Provider  acetaminophen (TYLENOL) 325 MG tablet Take 1-2 tablets (325-650 mg total) by mouth every 4 (four) hours as needed for mild pain. 03/15/22  Yes Love, Evlyn Kanner, PA-C  amiodarone (PACERONE) 200 MG tablet Take 0.5 tablets (100 mg total) by mouth daily. 07/04/23  Yes Duke Salvia, MD  amoxicillin (AMOXIL) 500 MG capsule TAKE FOUR CAPSULES BY MOUTH ONE hour prior TO dental procedure 06/30/23  Yes [name], MD  Ascorbic Acid (VITAMIN C) 1000 MG tablet Take 1,000 mg by mouth daily.   Yes [provider]  aspirin EC 81 MG tablet Take 1 tablet (81 mg total) by mouth daily. Swallow whole. 06/15/23  Yes [name], MD  atorvastatin (LIPITOR) 80 MG tablet TAKE 1 TABLET BY MOUTH EVERY DAY 11/15/22  Yes [name], Elberta Fortis, MD  ezetimibe (ZETIA) 10 MG tablet Take 1 tablet (10 mg total) by mouth daily. 10/05/22  Yes Duke Salvia, MD  JARDIANCE 10 MG TABS tablet TAKE 1 TABLET BY MOUTH EVERY DAY 06/28/23  Yes [name], Elberta Fortis, MD  Multiple Vitamins-Minerals (MULTIVITAMIN,TX-MINERALS) tablet Take 1 tablet by mouth daily.   Yes [provider]  nitroGLYCERIN (NITROSTAT) 0.4 MG SL tablet Place 1 tablet (0.4 mg total) under the tongue every 5 (five) minutes x 3 doses as needed for chest pain. 02/20/22  Yes Leone Brand, NP  valsartan (DIOVAN) 80 MG tablet Take 1.5 tablets (120 mg total) by mouth daily. Patient taking differently: Take 80 mg by mouth daily. 10/25/22  Yes [place], Betty G, MD  warfarin (COUMADIN) 4 MG tablet TAKE 1 TABLET BY MOUTH DAILY EXCEPT TAKE 1/2 TABLET ON MONDAYS AND FRIDAYS OR AS DIRECTED BY ANTICOAGULATION  CLINIC 05/03/23  Yes Kristian Covey, MD    Physical Exam: Vitals:   07/07/23 1720 07/07/23 1915 07/07/23 2203  BP: (!) 182/57 (!) 175/60 (!) 167/61  Pulse: (!) 56 (!) 53 (!) 56  Resp: 19 18 18   Temp: 97.8 F (36.6 C) 98.2 F (36.8 C) 98.2 F (36.8 C)  TempSrc: Oral  Oral  SpO2: 98% 100% 99%   Constitutional: NAD, calm, comfortable Respiratory: clear to auscultation bilaterally, no wheezing, no crackles. Normal respiratory effort. No accessory muscle use.  Cardiovascular: Regular rate and rhythm, no murmurs / rubs / gallops. No extremity edema. 2+ pedal pulses. No carotid bruits.  Abdomen: no tenderness, no masses palpated. No hepatosplenomegaly. Bowel sounds positive.  Neurologic: CN 2-12 grossly intact. Sensation intact,  DTR normal. Strength 5/5 in all 4.  Psychiatric: Normal judgment and insight. Alert and oriented x 3. Normal mood.   Data Reviewed: {Tip this will not be part of the note when signed- Document your independent interpretation of telemetry tracing, EKG, lab, Radiology test or any other diagnostic tests. Add any new diagnostic test ordered today. (Optional):[id number]}   Labs on Admission: I have personally reviewed following labs and imaging studies  CBC: Recent Labs  Lab 07/07/23 1738 07/07/23 2045  WBC 6.7  --   HGB 13.4 13.9  HCT 41.7 43.0  MCV 102.5*  --   PLT 144*  --    Basic Metabolic Panel: Recent Labs  Lab 07/07/23 1738  NA 140  K 4.5  CL 104  CO2 26  GLUCOSE 112*  BUN 18  CREATININE 1.27*  CALCIUM 8.9   GFR: Estimated Creatinine Clearance: 32 mL/min (A) (by C-G formula based on SCr of 1.27 mg/dL (H)). Liver Function Tests: Recent Labs  Lab 07/07/23 1738  AST 33  ALT 30  ALKPHOS 119  BILITOT 1.0  PROT 6.7  ALBUMIN 3.8   No results for input(s): "LIPASE", "AMYLASE" in the last 168 hours. No results for input(s): "AMMONIA" in the last 168 hours. Coagulation Profile: Recent Labs  Lab 07/07/23 2045  INR 2.3*   Cardiac  Enzymes: No results for input(s): "CKTOTAL", "CKMB", "CKMBINDEX", "TROPONINI" in the last 168 hours. BNP (last 3 results) No results for input(s): "PROBNP" in the last 8760 hours. HbA1C: No results for input(s): "HGBA1C" in the last 72 hours. CBG: No results for input(s): "GLUCAP" in the last 168 hours. Lipid Profile: No results for input(s): "CHOL", "HDL", "LDLCALC", "TRIG", "CHOLHDL", "LDLDIRECT" in the last 72 hours. Thyroid Function Tests: No results for input(s): "TSH", "T4TOTAL", "FREET4", "T3FREE", "THYROIDAB" in the last 72 hours. Anemia Panel: No results for input(s): "VITAMINB12", "FOLATE", "FERRITIN", "TIBC", "IRON", "RETICCTPCT" in the last 72 hours. Urine analysis:    Component Value Date/Time   COLORURINE YELLOW 12/02/2020 1154   APPEARANCEUR CLEAR 12/02/2020 1154   LABSPEC 1.006 12/02/2020 1154   PHURINE 7.0 12/02/2020 1154   GLUCOSEU NEGATIVE 12/02/2020 1154   GLUCOSEU NEGATIVE 01/29/2010 1351   HGBUR NEGATIVE 12/02/2020 1154   BILIRUBINUR NEGATIVE 12/02/2020 1154   BILIRUBINUR n 08/27/2014 1657   KETONESUR NEGATIVE 12/02/2020 1154   PROTEINUR NEGATIVE 12/02/2020 1154   UROBILINOGEN 1.0 03/06/2015 1446   NITRITE NEGATIVE 12/02/2020 1154   LEUKOCYTESUR NEGATIVE 12/02/2020 1154    Radiological Exams on Admission: No results found.  EKG: Independently reviewed.   Assessment and Plan: * BRBPR (bright red blood per rectum) In patient on chronic coumadin.  INR 2.3 today. Was on plavix previously, this was switched to ASA earlier last month due to being > 1 year out from DES. HGB stable thus far in ED (>13 x2). EDP d/w LBGI: Hold coumadin but not reversing with Vit K at this point Will see tomorrow Clear liquid diet Repeat CBC in AM Type and screen done Tele monitor Holding coumadin and ASA.  Coronary artery disease involving coronary bypass graft of native heart without angina pectoris Cont statin Holding ASA and coumadin in setting of acute  GIB  Chronic systolic CHF (congestive heart failure) (HCC) ICM with baseline EF of 35% Cont ARB Not on any diuretics chronically it looks like.  Paroxysmal atrial fibrillation (HCC) Hold coumadin in setting of GIB Cont amiodarone  Essential hypertension Cont home BP meds      Advance Care Planning:   Code  Status: DNR ***  Consults: LBGI called by EDP  Family Communication: ***  Severity of Illness: The appropriate patient status for this patient is OBSERVATION. Observation status is judged to be reasonable and necessary in order to provide the required intensity of service to ensure the patient's safety. The patient's presenting symptoms, physical exam findings, and initial radiographic and laboratory data in the context of their medical condition is felt to place them at decreased risk for further clinical deterioration. Furthermore, it is anticipated that the patient will be medically stable for discharge from the hospital within 2 midnights of admission.   Author: Hillary Bow., DO 07/07/2023 10:19 PM  For on call review [email or web address].

## 2023-07-07 NOTE — ED Triage Notes (Signed)
Pt presents with increasing frequency of BRB in stool.  His PCP recommended he come in for eval.  Pt notes there was a change in his meds recently but isn't sure which.  Pt has Plavix with him as part of his daily medicines but isn't sure if that's the one they changed or not.  No pain or complaints.

## 2023-07-07 NOTE — ED Provider Notes (Signed)
[place] EMERGENCY DEPARTMENT AT [facility] Provider Note   CSN: [id number] Arrival date & time: 07/07/23  1713     History {Add pertinent medical, surgical, social history, OB history to HPI:1} Chief Complaint  Patient presents with   Rectal Bleeding    DEMETRICE COMBES is a 82 y.o. male.  82 year old male with a history of CAD status post CABG and PCI, atrial fibrillation on amiodarone and warfarin, and TAVR who presents emergency department with rectal bleeding.  Several days ago had small amount of blood in the stool.  Says that today has had more frequent blood in the stool.  Is not dizzy, weak, having chest pain or shortness of breath.  Talk to an outpatient doctor who referred him to the emergency department for evaluation.  Takes warfarin 4 mg every day except for Monday Wednesday and Friday when he takes 2 mg.  Is also on aspirin.  Had a colonoscopy with [facility] and 2017 that showed 2 polyps.  No abdominal pain.  No surgeries.  No beets or red food recently.       Home Medications Prior to Admission medications   Medication Sig Start Date End Date Taking? Authorizing Provider  acetaminophen (TYLENOL) 325 MG tablet Take 1-2 tablets (325-650 mg total) by mouth every 4 (four) hours as needed for mild pain. 03/15/22   Love, Evlyn Kanner, PA-C  albuterol (VENTOLIN HFA) 108 (90 Base) MCG/ACT inhaler INHALE 2 PUFFS BY MOUTH EVERY 4 HOURS ASNEEDED FOR WHEEZING OR SHORTNESS OF BREATH. 01/27/21   [name], Elberta Fortis, MD  amiodarone (PACERONE) 200 MG tablet Take 0.5 tablets (100 mg total) by mouth daily. 07/04/23   Duke Salvia, MD  amoxicillin (AMOXIL) 500 MG capsule TAKE FOUR CAPSULES BY MOUTH ONE hour prior TO dental procedure 06/30/23   [name], MD  Ascorbic Acid (VITAMIN C) 1000 MG tablet Take 1,000 mg by mouth daily.    [provider]  aspirin EC 81 MG tablet Take 1 tablet (81 mg total) by mouth daily. Swallow whole. 06/15/23   [name], MD   atorvastatin (LIPITOR) 80 MG tablet TAKE 1 TABLET BY MOUTH EVERY DAY 11/15/22   [name], Elberta Fortis, MD  benzonatate (TESSALON) 100 MG capsule Take 1 capsule (100 mg total) by mouth 2 (two) times daily as needed for cough. 03/02/23   Deeann Saint, MD  diclofenac Sodium (VOLTAREN) 1 % GEL Apply 4 g topically 4 (four) times daily. 02/24/22   Melene Plan, DO  ezetimibe (ZETIA) 10 MG tablet Take 1 tablet (10 mg total) by mouth daily. 10/05/22   Duke Salvia, MD  Ferrous Sulfate (SLOW FE PO) Take by mouth as needed.    [provider]  fexofenadine (ALLEGRA ALLERGY) 60 MG tablet Take 1 tablet (60 mg total) by mouth 2 (two) times daily. 03/02/23   Deeann Saint, MD  furosemide (LASIX) 20 MG tablet Take 1 tablet (20 mg total) by mouth daily as needed. For swelling in the legs, shortness of breath or if your weight goes up by 2 lbs overnight. Call cardiology for input. 03/15/22   Love, Evlyn Kanner, PA-C  JARDIANCE 10 MG TABS tablet TAKE 1 TABLET BY MOUTH EVERY DAY 06/28/23   [name], Elberta Fortis, MD  loratadine (CLARITIN) 10 MG tablet Take 10 mg by mouth daily as needed for allergies.    [provider]  melatonin 5 MG TABS Take 1 tablet (5 mg total) by mouth at bedtime as needed (sleep).  03/15/22   Love, Evlyn Kanner, PA-C  Multiple Vitamins-Minerals (MULTIVITAMIN,TX-MINERALS) tablet Take 1 tablet by mouth daily.    [provider]  nitroGLYCERIN (NITROSTAT) 0.4 MG SL tablet Place 1 tablet (0.4 mg total) under the tongue every 5 (five) minutes x 3 doses as needed for chest pain. 02/20/22   Leone Brand, NP  valsartan (DIOVAN) 80 MG tablet Take 1.5 tablets (120 mg total) by mouth daily. Patient taking differently: Take 80 mg by mouth daily. 10/25/22   [place], Betty G, MD  warfarin (COUMADIN) 4 MG tablet TAKE 1 TABLET BY MOUTH DAILY EXCEPT TAKE 1/2 TABLET ON MONDAYS AND FRIDAYS OR AS DIRECTED BY ANTICOAGULATION CLINIC 05/03/23   [name], Elberta Fortis, MD      Allergies    Amlodipine,  Codeine, and Iodine    Review of Systems   Review of Systems  Physical Exam Updated Vital Signs BP (!) 175/60 (BP Location: Left Arm)   Pulse (!) 53   Temp 98.2 F (36.8 C)   Resp 18   SpO2 100%  Physical Exam Vitals and nursing note reviewed.  Constitutional:      General: He is not in acute distress.    Appearance: He is well-developed.  HENT:     Head: Normocephalic and atraumatic.     Right Ear: External ear normal.     Left Ear: External ear normal.     Nose: Nose normal.  Eyes:     Extraocular Movements: Extraocular movements intact.     Conjunctiva/sclera: Conjunctivae normal.     Pupils: Pupils are equal, round, and reactive to light.  Cardiovascular:     Rate and Rhythm: Normal rate.  Pulmonary:     Effort: Pulmonary effort is normal. No respiratory distress.     Breath sounds: Normal breath sounds.  Abdominal:     General: There is no distension.     Palpations: Abdomen is soft. There is no mass.     Tenderness: There is no abdominal tenderness. There is no guarding.  Genitourinary:    Comments: Chaperoned by patient's RN. No external hemorrhoids or fissures noted on external inspection. No internal masses or hemorroids noted. Normal rectal tone.  Bright red blood per rectum that was Hemoccult positive.   Musculoskeletal:     Cervical back: Normal range of motion and neck supple.     Right lower leg: No edema.     Left lower leg: No edema.  Skin:    General: Skin is warm and dry.  Neurological:     Mental Status: He is alert. Mental status is at baseline.  Psychiatric:        Mood and Affect: Mood normal.        Behavior: Behavior normal.     ED Results / Procedures / Treatments   Labs (all labs ordered are listed, but only abnormal results are displayed) Labs Reviewed  COMPREHENSIVE METABOLIC PANEL - Abnormal; Notable for the following components:      Result Value   Glucose, Bld 112 (*)    Creatinine, Ser 1.27 (*)    GFR, Estimated 56 (*)     All other components within normal limits  CBC - Abnormal; Notable for the following components:   RBC 4.07 (*)    MCV 102.5 (*)    Platelets 144 (*)    All other components within normal limits  PROTIME-INR  HEMOGLOBIN AND HEMATOCRIT, BLOOD  POC OCCULT BLOOD, ED  TYPE AND SCREEN    EKG None  Radiology No results found.  Procedures Procedures  {Document cardiac monitor, telemetry assessment procedure when appropriate:1}  Medications Ordered in ED Medications - No data to display  ED Course/ Medical Decision Making/ A&P Clinical Course as of 07/07/23 2204  Fri Jul 07, 2023  2118 Dr Leonides Schanz lb [RP]  2156 Dr Julian Reil [RP]    Clinical Course User Index [RP] Rondel Baton, MD   {   Click here for ABCD2, HEART and other calculatorsREFRESH Note before signing :1}                              Medical Decision Making Amount and/or Complexity of Data Reviewed Labs: ordered.  Risk Decision regarding hospitalization.   ***  {Document critical care time when appropriate:1} {Document review of labs and clinical decision tools ie heart score, Chads2Vasc2 etc:1}  {Document your independent review of radiology images, and any outside records:1} {Document your discussion with family members, caretakers, and with consultants:1} {Document social determinants of health affecting pt's care:1} {Document your decision making why or why not admission, treatments were needed:1} Final Clinical Impression(s) / ED Diagnoses Final diagnoses:  None    Rx / DC Orders ED Discharge Orders     None

## 2023-07-07 NOTE — Assessment & Plan Note (Signed)
Cont home BP meds ?

## 2023-07-07 NOTE — Assessment & Plan Note (Signed)
Cont statin Holding ASA and coumadin in setting of acute GIB

## 2023-07-08 DIAGNOSIS — I2510 Atherosclerotic heart disease of native coronary artery without angina pectoris: Secondary | ICD-10-CM | POA: Diagnosis not present

## 2023-07-08 DIAGNOSIS — I5032 Chronic diastolic (congestive) heart failure: Secondary | ICD-10-CM | POA: Diagnosis not present

## 2023-07-08 DIAGNOSIS — D696 Thrombocytopenia, unspecified: Secondary | ICD-10-CM

## 2023-07-08 DIAGNOSIS — I739 Peripheral vascular disease, unspecified: Secondary | ICD-10-CM | POA: Diagnosis not present

## 2023-07-08 DIAGNOSIS — K625 Hemorrhage of anus and rectum: Secondary | ICD-10-CM | POA: Diagnosis not present

## 2023-07-08 DIAGNOSIS — K921 Melena: Secondary | ICD-10-CM | POA: Diagnosis not present

## 2023-07-08 LAB — GLUCOSE, CAPILLARY
Glucose-Capillary: 85 mg/dL (ref 70–99)
Glucose-Capillary: 91 mg/dL (ref 70–99)
Glucose-Capillary: 92 mg/dL (ref 70–99)
Glucose-Capillary: 84 mg/dL (ref 70–99)
Glucose-Capillary: 84 mg/dL (ref 70–99)

## 2023-07-08 LAB — MAGNESIUM: Magnesium: 2.3 mg/dL (ref 1.7–2.4)

## 2023-07-08 MED ORDER — EZETIMIBE 10 MG PO TABS
10.0000 mg | ORAL_TABLET | Freq: Every day | ORAL | Status: DC
  Administered 2023-07-08 – 2023-07-16 (×9): 10 mg via ORAL
  Filled 2023-07-08 (×9): qty 1

## 2023-07-08 NOTE — Consult Note (Addendum)
Referring Provider: ? Primary Care Physician:  Kristian Covey, MD Primary Gastroenterologist:  Dr. Christella Hartigan  Reason for Consultation:  Hematochezia  HPI: Jesus [name] is a 82 y.o. male with medical history significant of PAF on coumadin and amiodarone, remote h/o TAVR, CAD s/p CABG 1997 on ASA.   Pt came in to ED with onset of BRBPR, started noticing this several days ago with small amount, but then this began to worsen so he figured he should probably be evaluated  he describes this as red blood when he wipes.  No pain in his bottom and reports no previous issues with hemorrhoids.  Sounds like maybe has some issues with constipation although hard to tell as he is not the best historian when describing things.  No nausea, vomiting, abdominal pain, dark stool, decreased appetite, heartburn/reflux, or dysphagia.  Hemoglobin is stable at 12.5 g, this really seems to be about his baseline although on admission was 13.4 g.  Likely somewhat of a dilutional drop there as well.  MCV actually high.  INR 2.3 yesterday and coumadin was held but not reversed.  Per EDP note, not fissures or hemorrhoids noted.  There was bright red blood on exam.  Due to being on coumadin he was admitted for observation.  Colonoscopy March 2017: Two polyps were found, removed and sent to pathology. These were both sessile, located in descending and rectum, 4-59mm across, removed with cold snare. The rectal polyp was slightly heaped up, soft and there was more than usual post polypectomy oozing of blood at the site. This was controlled with submucosal injection of 1cc of dilute epinephrine. 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 The examination was otherwise normal.  One was tubular adenoma and one was hyperplastic polyp on pathology.   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 S/P Ant MI 1980;  b. 1997 S/P CABG x 8 (LIMA to diag-LAD, SVG to OM1-OM2-OM3, SVG to  [facility] - Dr Andrey Campanile);  c. 01/2015 Cath: 3VD, 8/8 patent grafts.   Cardiomyopathy    Dysrhythmia    Esophageal reflux    GERD (gastroesophageal reflux disease)    Headache    History of colonoscopy    History of transesophageal echocardiography (TEE) for monitoring    Other and unspecified hyperlipidemia    Paroxysmal atrial fibrillation (HCC) 02/20/2022   S/P angioplasty with stent 02/17/22 DES to proximal VG to PDA 02/20/2022   S/P TAVR (transcatheter aortic valve replacement)    a. 03/2015 26 mm Edwards Sapien 3 transcatheter heart valve placed via open left transfemoral approach.   Severe aortic stenosis 08/10/2012   Skin lesions, generalized    facial which may represent actinic keratoses and possible photosensitivity from Amiodarone   Unspecified essential hypertension     Past Surgical History:  Procedure Laterality Date   CARDIAC CATHETERIZATION     CARDIOVERSION  11/18/2006   Dr. Jacklynn Bue   CATARACT EXTRACTION W/ INTRAOCULAR LENS IMPLANT  April '13  (Dr. Dagoberto Ligas)   left eye only   CHOLECYSTECTOMY     CORONARY ARTERY BYPASS GRAFT  02/09/1996   LIMA to diag-LAD, SVG to OM1-OM2-OM3, SVG to [facility]   CORONARY STENT INTERVENTION N/A 02/17/2022   Procedure: CORONARY STENT  INTERVENTION;  Surgeon: Kathleene Hazel, MD;  Location: [facility] INVASIVE CV LAB;  Service: Cardiovascular;  Laterality: N/A;   CORONARY/GRAFT ANGIOGRAPHY N/A 02/17/2022   Procedure: CORONARY/GRAFT ANGIOGRAPHY;  Surgeon: Kathleene Hazel, MD;  Location: [facility] INVASIVE CV LAB;  Service: Cardiovascular;  Laterality: N/A;   EYE SURGERY     LEFT AND RIGHT HEART CATHETERIZATION WITH CORONARY/GRAFT ANGIOGRAM N/A 01/26/2015   Procedure: LEFT AND RIGHT HEART CATHETERIZATION WITH Isabel Caprice;  Surgeon: Micheline Chapman, MD;  Location: [facility] CATH LAB;  Service: Cardiovascular;  Laterality: N/A;   TEE WITHOUT CARDIOVERSION N/A 03/10/2015   Procedure: TRANSESOPHAGEAL ECHOCARDIOGRAM (TEE);  Surgeon: [name], MD;  Location: [facility] OR;  Service: Open Heart Surgery;  Laterality: N/A;   TONSILLECTOMY     TRANSCATHETER AORTIC VALVE REPLACEMENT, TRANSFEMORAL N/A 03/10/2015   Procedure: TRANSCATHETER AORTIC VALVE REPLACEMENT, TRANSFEMORAL;  Surgeon: [name], MD;  Location: [facility] OR;  Service: Open Heart Surgery;  Laterality: N/A;    Prior to Admission medications   Medication Sig Start Date End Date Taking? Authorizing Provider  acetaminophen (TYLENOL) 325 MG tablet Take 1-2 tablets (325-650 mg total) by mouth every 4 (four) hours as needed for mild pain. 03/15/22  Yes Love, Evlyn Kanner, PA-C  amiodarone (PACERONE) 200 MG tablet Take 0.5 tablets (100 mg total) by mouth daily. 07/04/23  Yes Duke Salvia, MD  amoxicillin (AMOXIL) 500 MG capsule TAKE FOUR CAPSULES BY MOUTH ONE hour prior TO dental procedure 06/30/23  Yes [name], MD  Ascorbic Acid (VITAMIN C) 1000 MG tablet Take 1,000 mg by mouth daily.   Yes [provider]  aspirin EC 81 MG tablet Take 1 tablet (81 mg total) by mouth daily. Swallow whole. 06/15/23  Yes [name], MD  atorvastatin (LIPITOR) 80 MG tablet TAKE 1 TABLET BY MOUTH EVERY DAY 11/15/22  Yes [name], Elberta Fortis, MD  ezetimibe (ZETIA) 10 MG tablet Take 1 tablet (10 mg total) by mouth daily. 10/05/22  Yes Duke Salvia, MD  JARDIANCE 10 MG TABS tablet TAKE 1 TABLET BY MOUTH EVERY DAY 06/28/23  Yes [name], Elberta Fortis, MD  Multiple Vitamins-Minerals (MULTIVITAMIN,TX-MINERALS) tablet Take 1 tablet by mouth daily.   Yes [provider]  nitroGLYCERIN (NITROSTAT) 0.4 MG SL tablet Place 1 tablet (0.4 mg total) under the tongue every 5 (five) minutes x 3 doses as needed for chest pain. 02/20/22  Yes Leone Brand, NP  valsartan (DIOVAN) 80 MG tablet Take 1.5 tablets (120 mg  total) by mouth daily. Patient taking differently: Take 80 mg by mouth daily. 10/25/22  Yes [place], Betty G, MD  warfarin (COUMADIN) 4 MG tablet TAKE 1 TABLET BY MOUTH DAILY EXCEPT TAKE 1/2 TABLET ON MONDAYS AND FRIDAYS OR AS DIRECTED BY ANTICOAGULATION CLINIC 05/03/23  Yes [name], Elberta Fortis, MD    Current Facility-Administered Medications  Medication Dose Route Frequency Provider Last Rate Last Admin   acetaminophen (TYLENOL) tablet 650 mg  650 mg Oral Q6H PRN Hillary Bow, DO       Or   acetaminophen (TYLENOL) suppository 650 mg  650 mg Rectal Q6H PRN Hillary Bow, DO       amiodarone (PACERONE) tablet 100 mg  100 mg Oral Daily Julian Reil, Jared M, DO       atorvastatin (LIPITOR) tablet 80 mg  80 mg Oral Daily Julian Reil, Jared M, DO       insulin aspart (novoLOG) injection 0-9 Units  0-9 Units Subcutaneous Q4H Hillary Bow, DO  irbesartan (AVAPRO) tablet 75 mg  75 mg Oral Daily Julian Reil, Jared M, DO       ondansetron [facility]) tablet 4 mg  4 mg Oral Q6H PRN Hillary Bow, DO       Or   ondansetron [facility]) injection 4 mg  4 mg Intravenous Q6H PRN Hillary Bow, DO       pantoprozole (PROTONIX) 80 mg /NS 100 mL infusion  8 mg/hr Intravenous Continuous Rondel Baton, MD 10 mL/hr at 07/08/23 1610 8 mg/hr at 07/08/23 0647    Allergies as of 07/07/2023 - Review Complete 07/07/2023  Allergen Reaction Noted   Amlodipine Other (See Comments) 03/08/2022   Codeine Other (See Comments) 09/10/2007   Iodine Swelling     Family History  Problem Relation Age of Onset   Stroke Mother 7       Deceased   Crohn's disease Mother        Deceased   Heart attack Father 55       Deceased   Heart disease Father    Heart failure Father        Deceased   Atrial fibrillation Brother    Heart disease Paternal Uncle    Prostate cancer Paternal Uncle    Colon cancer Neg Hx     Social History   Socioeconomic History   Marital status: Married    Spouse name: Not on file    Number of children: Not on file   Years of education: Not on file   Highest education level: Not on file  Occupational History   Not on file  Tobacco Use   Smoking status: Never   Smokeless tobacco: Never   Tobacco comments:    Does not smoke.  Vaping Use   Vaping status: Never Used  Substance and Sexual Activity   Alcohol use: No    Alcohol/week: 0.0 standard drinks of alcohol   Drug use: No   Sexual activity: Not Currently  Other Topics Concern   Not on file  Social History Narrative   HSG. [employer] - [employer] 6 years. Married - '69 - 1 year/divorced. '76 - . No children. Work - mfg/textiles - Audiological scientist; currently works doing maintenance. [employer] - they have discussed this. Provided packet august '13.                Social Determinants of Health   Financial Resource Strain: Low Risk  (01/24/2023)   Overall Financial Resource Strain (CARDIA)    Difficulty of Paying Living Expenses: Not very hard  Food Insecurity: No Food Insecurity (07/08/2023)   Hunger Vital Sign    Worried About Running Out of Food in the Last Year: Never true    Ran Out of Food in the Last Year: Never true  Transportation Needs: No Transportation Needs (07/08/2023)   PRAPARE - Administrator, Civil Service (Medical): No    Lack of Transportation (Non-Medical): No  Physical Activity: Sufficiently Active (07/28/2022)   Exercise Vital Sign    Days of Exercise per Week: 3 days    Minutes of Exercise per Session: 60 min  Stress: No Stress Concern Present (07/28/2022)   [employer] of Occupational Health - Occupational Stress Questionnaire    Feeling of Stress : Not at all  Social Connections: Moderately Integrated (07/28/2022)   Social Connection and Isolation Panel [NHANES]    Frequency of Communication with Friends and Family: More than three times a week    Frequency of Social  Gatherings with Friends and Family: More than three times a week    Attends Religious Services: More than 4  times per year    Active Member of Clubs or Organizations: Yes    Attends Banker Meetings: More than 4 times per year    Marital Status: Widowed  Intimate Partner Violence: Not At Risk (07/08/2023)   Humiliation, Afraid, Rape, and Kick questionnaire    Fear of Current or Ex-Partner: No    Emotionally Abused: No    Physically Abused: No    Sexually Abused: No    Review of Systems: ROS is O/W negative except as mentioned in HPI.  Physical Exam: Vital signs in last 24 hours: Temp:  [97.8 F (36.6 C)-98.9 F (37.2 C)] 97.9 F (36.6 C) (08/03 0806) Pulse Rate:  [48-59] 59 (08/03 0806) Resp:  [18-19] 18 (08/03 0806) BP: (120-182)/(50-61) 120/50 (08/03 0806) SpO2:  [95 %-100 %] 95 % (08/03 0806) Weight:  [54.9 kg] 54.9 kg (08/03 0005) Last BM Date : 07/07/23 General:  Alert, Well-developed, well-nourished, pleasant and cooperative in NAD Head:  Normocephalic and atraumatic. Eyes:  Sclera clear, no icterus.  Conjunctiva pink. Ears:  Normal auditory acuity. Mouth:  No deformity or lesions.   Lungs:  Clear throughout to auscultation.  No wheezes, crackles, or rhonchi.  Heart:  Regular rate and rhythm; no murmurs, clicks, rubs, or gallops. Abdomen:  Soft, non-distended.  BS present.  Non-tender.  Msk:  Symmetrical without gross deformities. Pulses:  Normal pulses noted. Extremities:  Without clubbing or edema. Neurologic:  Alert and oriented x 4;  grossly normal neurologically. Skin:  Intact without significant lesions or rashes. Psych:  Alert and cooperative. Normal mood and affect.  Intake/Output from previous day: 08/02 0701 - 08/03 0700 In: 167.3 [I.V.:70.3; IV Piggyback:97] Out: -   Lab Results: Recent Labs    07/07/23 1738 07/07/23 2045 07/08/23 0445  WBC 6.7  --  5.6  HGB 13.4 13.9 12.5*  HCT 41.7 43.0 37.8*  PLT 144*  --  124*   BMET Recent Labs    07/07/23 1738 07/08/23 0445  NA 140 137  K 4.5 4.1  CL 104 106  CO2 26 25  GLUCOSE 112* 93   BUN 18 18  CREATININE 1.27* 1.17  CALCIUM 8.9 8.5*   LFT Recent Labs    07/07/23 1738  PROT 6.7  ALBUMIN 3.8  AST 33  ALT 30  ALKPHOS 119  BILITOT 1.0   PT/INR Recent Labs    07/07/23 2045  LABPROT 25.5*  INR 2.3*   IMPRESSION:  *Lower GI bleed: Could certainly be diverticular in origin actually sounds like could possibly be hemorrhoids as well.  Last colonoscopy 02/2016 with diverticulosis and 2 polyps that were removed.  Hemoglobin is stable at 12.5 g, this really seems to be about his baseline although on admission was 13.4 g.  Likely somewhat of a dilutional drop there as well.  MCV actually high. *Coronary artery disease previously on Plavix, but switched to ASA last month due to being greater than a year out from DES. *PAF on Coumadin.  Was 2.3 yesterday.  Coumadin being held, but INR not being reversed. *History of TAVR *Chronic systolic CHF with last EF of 35 to 40% *Thrombocytopenia with platelet count of 124 K.  Looks like this is chronically just very slightly low.  PLAN: -Monitor Hgb. -??  Colonoscopy.  He is willing to proceed with we think it is necessary.  Will discuss with Dr. Marina Goodell.  Princella Pellegrini. Zehr  07/08/2023, 8:54 AM  GI ATTENDING  History, laboratories, x-rays, prior colonoscopy report reviewed.  Patient seen and examined as outlined above.  Agree with comprehensive consultation as outlined above.  Patient presents with low-volume hematochezia in the face of chronic anticoagulation.  Multiple medical problems.  No pain.  Hemodynamically stable.  Hemoglobin 12.5.  Negative rectal exam in ER.  Suspect low-grade diverticular bleed.  Agree with holding Coumadin for now and monitoring stools and blood counts.  No plans for colonoscopy at this time (colonoscopy 2017 with pandiverticulosis).  If he were to develop significant acute bleeding, would advocate for urgent CTA.  If CTA positive, then referred to IR for embolization therapy.  We will follow.  Wilhemina Bonito.  Eda Keys., M.D. [facility] Division of Gastroenterology

## 2023-07-08 NOTE — Plan of Care (Signed)
  Problem: Education: Goal: Ability to describe self-care measures that may prevent or decrease complications (Diabetes Survival Skills Education) will improve Outcome: Progressing Goal: Individualized Educational Video(s) Outcome: Progressing   Problem: Coping: Goal: Ability to adjust to condition or change in health will improve Outcome: Progressing   Problem: Fluid Volume: Goal: Ability to maintain a balanced intake and output will improve Outcome: Progressing   Problem: Metabolic: Goal: Ability to maintain appropriate glucose levels will improve Outcome: Progressing   Problem: Nutritional: Goal: Maintenance of adequate nutrition will improve Outcome: Progressing Goal: Progress toward achieving an optimal weight will improve Outcome: Progressing

## 2023-07-08 NOTE — Progress Notes (Signed)
**Note De-Identified vi Obfusction** PROGRESS NOTE    [name]  AYT:[id number] DOB: May 12, 1941 DOA: 07/07/2023 PCP: Kristin Covey, MD  Chief Complint  Ptient presents with   Rectl Bleeding    Brief Nrrtive:   Jesus [name] is  82 y.o. mle with medicl history significnt of PAF on coumdin nd miodrone, remote h/o TAVR, CAD s/p CABG 1997.  Here with bright red blood per rectum.   Assessment & Pln:   Principl Problem:   BRBPR (bright red blood per rectum) Active Problems:   S/P CABG 1997   Coronry rtery disese involving coronry bypss grft of ntive hert without ngin pectoris   S/P ngioplsty with stent 02/17/22 DES to proximl VG to PDA   Chronic systolic CHF (congestive hert filure) (HCC)   Proxysml tril fibrilltion (HCC)   Essentil hypertension  BRBPR (bright red blood per rectum) In ptient on chronic coumdin.  INR 2.3 6/2. Ws on plvix previously, this ws switched to ASA erlier lst month due to being > 1 yer out from DES. HGB remins reltively stble EDP d/w LBGI, pprecite ssistnce PPI gtt Cler liquid diet Holding coumdin nd ASA.   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 Appers euvolemic   Proxysml tril fibrilltion (HCC) Hold coumdin in setting of GIB Cont miodrone   Hx TAVR Aspirin, wrfrin on hold   Essentil hypertension Cont home BP meds    DVT prophylxis: SCD Code Sttus: DNR Fmily Communiction: none Disposition:   Sttus is: Observtion The ptient remins OBS pproprite nd will d/c before 2 midnights.   Consultnts:  GI  Procedures:  none  Antimicrobils:  Anti-infectives (From dmission, onwrd)    None        Subjective: No complints Lst bloody stool when he got here  Objective: Vitls:   07/07/23 2300 07/08/23 0005 07/08/23 0420 07/08/23 0806  BP: (!) 137/51 (!) 162/58 (!) 143/57 (!) 120/50  Pulse: (!) 48 (!) 53 (!) 51 (!) 59  Resp:  18  18  Temp:  97.9 F (36.6 C) 98.9 F (37.2 C) 97.9 F (36.6 C)  TempSrc:  Orl Orl Orl  SpO2: 97% 99% 97% 95%  Weight:  54.9 kg    Height:  5' 0.98" (1.549 m)      Intke/Output Summry (Lst 24 hours) t 07/08/2023 0808 Lst dt filed t 07/08/2023 3557 Gross per 24 hour  Intke 167.28 ml  Output --  Net 167.28 ml   Filed Weights   07/08/23 0005  Weight: 54.9 kg    Exmintion:  Generl exm: Appers clm nd comfortble  Respirtory system: unlbored Crdiovsculr system: RRR Gstrointestinl system: Abdomen is nondistended, soft nd nontender. No orgnomegly or msses felt. Norml bowel sounds herd. Centrl nervous system: Alert nd oriented. No focl neurologicl deficits. Extremities: no LEE   Dt Reviewed: I hve personlly reviewed following lbs nd imging studies  CBC: Recent Lbs  Lb 07/07/23 1738 07/07/23 2045 07/08/23 0445  WBC 6.7  --  5.6  HGB 13.4 13.9 12.5*  HCT 41.7 43.0 37.8*  MCV 102.5*  --  101.1*  PLT 144*  --  124*    Bsic Metbolic Pnel: Recent Lbs  Lb 07/07/23 1738 07/08/23 0445  NA 140 137  K 4.5 4.1 CL 104 106  CO2 26 25  GLUCOSE 112* 93  BUN 18 18  CREATININE 1.27* 1.17  CALCIUM 8.9 8.5*    GFR: Estimated Creatinine Clearance: 34.8 mL/min (by C-G formula based on SCr of 1.17 mg/dL).  Liver Function Tests: Recent Labs  Lab 07/07/23 1738  AST 33  ALT 30  ALKPHOS 119  BILITOT 1.0  PROT 6.7  ALBUMIN 3.8    CBG: Recent Labs  Lab 07/08/23 0411  GLUCAP 85     No results found for this or any previous visit (from the past 240 hour(s)).       Radiology Studies: No results found.      Scheduled Meds:  amiodarone  100 mg Oral Daily   atorvastatin   80 mg Oral Daily   insulin aspart  0-9 Units Subcutaneous Q4H   irbesartan  75 mg Oral Daily   Continuous Infusions:  pantoprazole 8 mg/hr (07/08/23 0647)     LOS: 0 days    Time spent: over 30 min    Lacretia Nicks, MD [facility]   To contact the attending provider between 7A-7P or the covering provider during after hours 7P-7A, please log into the web site [email or web address] and access using universal [place] password for that web site. If you do not have the password, please call the hospital operator.  07/08/2023, 8:08 AM

## 2023-07-09 DIAGNOSIS — D696 Thrombocytopenia, unspecified: Secondary | ICD-10-CM | POA: Diagnosis not present

## 2023-07-09 DIAGNOSIS — K625 Hemorrhage of anus and rectum: Secondary | ICD-10-CM | POA: Diagnosis not present

## 2023-07-09 DIAGNOSIS — I739 Peripheral vascular disease, unspecified: Secondary | ICD-10-CM | POA: Diagnosis not present

## 2023-07-09 DIAGNOSIS — I2510 Atherosclerotic heart disease of native coronary artery without angina pectoris: Secondary | ICD-10-CM | POA: Diagnosis not present

## 2023-07-09 DIAGNOSIS — D689 Coagulation defect, unspecified: Secondary | ICD-10-CM

## 2023-07-09 DIAGNOSIS — K921 Melena: Secondary | ICD-10-CM | POA: Diagnosis not present

## 2023-07-09 LAB — COMPREHENSIVE METABOLIC PANEL WITH GFR
ALT: 28 U/L (ref 0–44)
AST: 30 U/L (ref 15–41)
Albumin: 3.2 g/dL — ABNORMAL LOW (ref 3.5–5.0)
Alkaline Phosphatase: 92 U/L (ref 38–126)
Anion gap: 11 (ref 5–15)
BUN: 17 mg/dL (ref 8–23)
CO2: 22 mmol/L (ref 22–32)
Calcium: 8.5 mg/dL — ABNORMAL LOW (ref 8.9–10.3)
Chloride: 104 mmol/L (ref 98–111)
Creatinine, Ser: 1.23 mg/dL (ref 0.61–1.24)
GFR, Estimated: 58 mL/min — ABNORMAL LOW (ref >60)
Glucose, Bld: 79 mg/dL (ref 70–99)
Potassium: 4 mmol/L (ref 3.5–5.1)
Sodium: 137 mmol/L (ref 135–145)
Total Bilirubin: 1.7 mg/dL — ABNORMAL HIGH (ref 0.3–1.2)
Total Protein: 6 g/dL — ABNORMAL LOW (ref 6.5–8.1)

## 2023-07-09 LAB — GLUCOSE, CAPILLARY
Glucose-Capillary: 108 mg/dL — ABNORMAL HIGH (ref 70–99)
Glucose-Capillary: 124 mg/dL — ABNORMAL HIGH (ref 70–99)
Glucose-Capillary: 87 mg/dL (ref 70–99)
Glucose-Capillary: 99 mg/dL (ref 70–99)

## 2023-07-09 LAB — CBC WITH DIFFERENTIAL/PLATELET
Abs Immature Granulocytes: 0.02 10*3/uL (ref 0.00–0.07)
Basophils Absolute: 0 10*3/uL (ref 0.0–0.1)
Basophils Relative: 0 %
Eosinophils Absolute: 0.1 10*3/uL (ref 0.0–0.5)
Eosinophils Relative: 2 %
HCT: 40.2 % (ref 39.0–52.0)
Hemoglobin: 13 g/dL (ref 13.0–17.0)
Immature Granulocytes: 0 %
Lymphocytes Relative: 19 %
Lymphs Abs: 1.3 10*3/uL (ref 0.7–4.0)
MCH: 32.7 pg (ref 26.0–34.0)
MCHC: 32.3 g/dL (ref 30.0–36.0)
MCV: 101 fL — ABNORMAL HIGH (ref 80.0–100.0)
Monocytes Absolute: 0.8 10*3/uL (ref 0.1–1.0)
Monocytes Relative: 11 %
Neutro Abs: 4.7 10*3/uL (ref 1.7–7.7)
Neutrophils Relative %: 68 %
Platelets: 129 10*3/uL — ABNORMAL LOW (ref 150–400)
RBC: 3.98 MIL/uL — ABNORMAL LOW (ref 4.22–5.81)
RDW: 13.5 % (ref 11.5–15.5)
WBC: 6.9 10*3/uL (ref 4.0–10.5)
nRBC: 0 % (ref 0.0–0.2)

## 2023-07-09 LAB — PROTIME-INR
INR: 2 — ABNORMAL HIGH (ref 0.8–1.2)
Prothrombin Time: 23 seconds — ABNORMAL HIGH (ref 11.4–15.2)

## 2023-07-09 LAB — PHOSPHORUS: Phosphorus: 4.1 mg/dL (ref 2.5–4.6)

## 2023-07-09 LAB — MAGNESIUM: Magnesium: 2.1 mg/dL (ref 1.7–2.4)

## 2023-07-09 MED ORDER — MAGNESIUM SULFATE 2 GM/50ML IV SOLN
2.0000 g | Freq: Once | INTRAVENOUS | Status: AC
  Administered 2023-07-09: 2 g via INTRAVENOUS
  Filled 2023-07-09: qty 50

## 2023-07-09 MED ORDER — PANTOPRAZOLE SODIUM 40 MG IV SOLR
40.0000 mg | INTRAVENOUS | Status: DC
  Administered 2023-07-09 – 2023-07-15 (×7): 40 mg via INTRAVENOUS
  Filled 2023-07-09 (×7): qty 10

## 2023-07-09 NOTE — Plan of Care (Signed)
  Problem: Education: Goal: Ability to describe self-care measures that may prevent or decrease complications (Diabetes Survival Skills Education) will improve Outcome: Progressing Goal: Individualized Educational Video(s) Outcome: Progressing   Problem: Coping: Goal: Ability to adjust to condition or change in health will improve Outcome: Progressing   Problem: Metabolic: Goal: Ability to maintain appropriate glucose levels will improve Outcome: Progressing   Problem: Nutritional: Goal: Maintenance of adequate nutrition will improve Outcome: Progressing Goal: Progress toward achieving an optimal weight will improve Outcome: Progressing   Problem: Skin Integrity: Goal: Risk for impaired skin integrity will decrease Outcome: Progressing   Problem: Education: Goal: Knowledge of General Education information will improve Description: Including pain rating scale, medication(s)/side effects and non-pharmacologic comfort measures Outcome: Progressing   Problem: Health Behavior/Discharge Planning: Goal: Ability to manage health-related needs will improve Outcome: Progressing   Problem: Activity: Goal: Risk for activity intolerance will decrease Outcome: Progressing   Problem: Safety: Goal: Ability to remain free from injury will improve Outcome: Progressing

## 2023-07-09 NOTE — Plan of Care (Signed)

## 2023-07-09 NOTE — Progress Notes (Addendum)
[facility] Progress Note  CC:  Hematochezia   Subjective:  Feels fine, only a small amount of bleeding.  Objective:  Vital signs in last 24 hours: Temp:  [97.4 F (36.3 C)-98.1 F (36.7 C)] 98.1 F (36.7 C) (08/04 0835) Pulse Rate:  [50-62] 51 (08/04 0835) Resp:  [18] 18 (08/04 0835) BP: (134-149)/(48-65) 140/56 (08/04 0835) SpO2:  [95 %-97 %] 95 % (08/04 0835) Last BM Date : 07/08/23 General:  Alert, Well-developed, in NAD Heart:  Regular rate and rhythm; no murmurs Pulm:  CTAB.  No W/R/R. Abdomen:  Soft, non-distended.  BS present.  Non-tender. Extremities:  Without edema.  Intake/Output from previous day: 08/03 0701 - 08/04 0700 In: 113.2 [I.V.:113.2] Out: 240 [Urine:240]  Lab Results: Recent Labs    07/07/23 1738 07/07/23 2045 07/08/23 0445 07/09/23 0427  WBC 6.7  --  5.6 6.9  HGB 13.4 13.9 12.5* 13.0  HCT 41.7 43.0 37.8* 40.2  PLT 144*  --  124* 129*   BMET Recent Labs    07/07/23 1738 07/08/23 0445 07/09/23 0427  NA 140 137 137  K 4.5 4.1 4.0  CL 104 106 104  CO2 26 25 22   GLUCOSE 112* 93 79  BUN 18 18 17   CREATININE 1.27* 1.17 1.23  CALCIUM 8.9 8.5* 8.5*   LFT Recent Labs    07/09/23 0427  PROT 6.0*  ALBUMIN 3.2*  AST 30  ALT 28  ALKPHOS 92  BILITOT 1.7*   PT/INR Recent Labs    07/07/23 2045 07/09/23 1111  LABPROT 25.5* 23.0*  INR 2.3* 2.0*   Assessment / Plan: *Lower GI bleed: Could certainly be diverticular in origin actually sounds like could possibly be hemorrhoids as well.  Last colonoscopy 02/2016 with diverticulosis and 2 polyps that were removed.  Hemoglobin is stable at 12.5 g, this really seems to be about his baseline although on admission was 13.4 g.  Likely somewhat of a dilutional drop there as well.  MCV actually high.  Hgb stable today at 13.0 grams. *Coronary artery disease previously on Plavix, but switched to ASA last month due to being greater than a year out from DES. *PAF on Coumadin.  INR  2.0 today.  Coumadin being held, but INR not being reversed. *History of TAVR *Chronic systolic CHF with last EF of 35 to 40% *Thrombocytopenia with platelet count of 124 K.  Looks like this is chronically just very slightly low.  -Monitor Hgb and transfuse if needed. -If he would develop brisk bleeding then would advocate for CTA. -Not sure why he was on PPI drip, but I did change that to pantoprazole 40 mg IV once daily.  Not even quite sure that he really needs that. -Will leave on clear liquids as we will see what his INR looks like tomorrow for possible prepping for colonoscopy on Tuesday.    LOS: 0 days   Princella Pellegrini. Zehr  07/09/2023, 1:05 PM  GI ATTENDING  Interval history data reviewed.  Agree with interval progress note as outlined above.  Minor bleeding only.  Hemoglobin stable.  INR drifting toward normal.  His bleeding is a bit atypical for diverticular, unless unusually minor.  No obvious abnormalities on ER rectal exam to explain bleeding.  Thus, I would favor colonoscopy prior to discharge with a normalized INR to exclude other possibilities (namely neoplasia) in a gentleman who will need to resume anticoagulation.  GI team will see tomorrow.  They will likely initiate colonoscopy prep tomorrow with  plans for procedure on Tuesday.  Wilhemina Bonito. Eda Keys., M.D. [facility] of Gastroenterology

## 2023-07-09 NOTE — Progress Notes (Addendum)
PROGRESS NOTE    Jesus [name]  YHC:[id number] DOB: 05-13-1941 DOA: 07/07/2023 PCP: Kristian Covey, MD  Chief Complaint  Patient presents with   Rectal Bleeding    Brief Narrative:   Jesus [name] is a 82 y.o. male with medical history significant of PAF on coumadin and amiodarone, remote h/o TAVR, CAD s/p CABG 1997.  Here with bright red blood per rectum.      Subjective: The patient was seen and examined this morning Hemodynamically stable, no issues overnight, not reporting of any rectal bleed     Assessment & Plan:   Principal Problem:   BRBPR (bright red blood per rectum) Active Problems:   S/P CABG 1997   Coronary artery disease involving coronary bypass graft of native heart without angina pectoris   S/P angioplasty with stent 02/17/22 DES to proximal VG to PDA   Chronic systolic CHF (congestive heart failure) (HCC)   Paroxysmal atrial fibrillation (HCC)   Essential hypertension  BRBPR (bright red blood per rectum) In patient on chronic coumadin.  INR 2.3 6/2. Was on plavix previously, this was switched to ASA earlier last month due to being > 1 year out from DES. -Holding Coumadin Monitoring H&H    Latest Ref Rng & Units 07/09/2023    4:27 AM 07/08/2023    4:45 AM 07/07/2023    8:45 PM  CBC  WBC 4.0 - 10.5 K/uL 6.9  5.6    Hemoglobin 13.0 - 17.0 g/dL 51.7  61.6  07.3   Hematocrit 39.0 - 52.0 % 40.2  37.8  43.0   Platelets 150 - 400 K/uL 129  124        EDP d/w LBGI, appreciate assistance>>> ?  Plan for EGD/ colonoscopy Cont . PPI gtt Clear liquid diet Holding coumadin and ASA.   Coronary artery disease involving coronary bypass graft of native heart without angina pectoris Cont statin Holding ASA and coumadin in setting of acute GIB   Chronic systolic CHF (congestive heart failure) (HCC) ICM with baseline EF of 35% Cont ARB Jardiance on hold Not on any diuretics chronically it looks like. Appears euvolemic   Paroxysmal atrial fibrillation  (HCC) Hold coumadin in setting of GIB Cont amiodarone   Hx TAVR Aspirin, warfarin on hold   Essential hypertension Cont home BP meds    DVT prophylaxis: SCD Code Status: DNR Family Communication: none Disposition:   Status is: Observation The patient remains OBS appropriate and will d/c before 2 midnights.   Consultants:  GI  Procedures:  none  Antimicrobials:  Anti-infectives (From admission, onward)    None        Objective: Vitals:   07/08/23 1949 07/09/23 0042 07/09/23 0442 07/09/23 0835  BP: (!) 149/65 (!) 134/48 (!) 141/53 (!) 140/56  Pulse: 62 (!) 50 (!) 52 (!) 51  Resp: 18 18 18 18   Temp: 98 F (36.7 C) 98.1 F (36.7 C) (!) 97.4 F (36.3 C) 98.1 F (36.7 C)  TempSrc: Oral Oral Oral   SpO2: 96% 96% 97% 95%  Weight:      Height:        Intake/Output Summary (Last 24 hours) at 07/09/2023 1309 Last data filed at 07/09/2023 0111 Gross per 24 hour  Intake 113.18 ml  Output --  Net 113.18 ml   Filed Weights   07/08/23 0005  Weight: 54.9 kg    General:  AAO x 3,  cooperative, no distress;   HEENT:  Normocephalic, PERRL, otherwise with in Normal limits  Neuro:  CNII-XII intact. , normal motor and sensation, reflexes intact   Lungs:   Clear to auscultation BL, Respirations unlabored,  No wheezes / crackles  Cardio:    S1/S2, RRR, No murmure, No Rubs or Gallops   Abdomen:  Soft, non-tender, bowel sounds active all four quadrants, no guarding or peritoneal signs.  Muscular  skeletal:  Limited exam -global generalized weaknesses - in bed, able to move all 4 extremities,   2+ pulses,  symmetric, No pitting edema  Skin:  Dry, warm to touch, negative for any Rashes,  Wounds: Please see nursing documentation         Data Reviewed: I have personally reviewed following labs and imaging studies  CBC: Recent Labs  Lab 07/07/23 1738 07/07/23 2045 07/08/23 0445 07/09/23 0427  WBC 6.7  --  5.6 6.9  NEUTROABS  --   --   --  4.7  HGB 13.4 13.9  12.5* 13.0  HCT 41.7 43.0 37.8* 40.2  MCV 102.5*  --  101.1* 101.0*  PLT 144*  --  124* 129*    Basic Metabolic Panel: Recent Labs  Lab 07/07/23 1738 07/08/23 0445 07/08/23 1009 07/09/23 0427  NA 140 137  --  137  K 4.5 4.1  --  4.0  CL 104 106  --  104  CO2 26 25  --  22  GLUCOSE 112* 93  --  79  BUN 18 18  --  17  CREATININE 1.27* 1.17  --  1.23  CALCIUM 8.9 8.5*  --  8.5*  MG  --   --  2.3 2.1  PHOS  --   --   --  4.1    GFR: Estimated Creatinine Clearance: 33.1 mL/min (by C-G formula based on SCr of 1.23 mg/dL).  Liver Function Tests: Recent Labs  Lab 07/07/23 1738 07/09/23 0427  AST 33 30  ALT 30 28  ALKPHOS 119 92  BILITOT 1.0 1.7*  PROT 6.7 6.0*  ALBUMIN 3.8 3.2*    CBG: Recent Labs  Lab 07/08/23 1644 07/08/23 1955 07/09/23 0043 07/09/23 0904 07/09/23 1247  GLUCAP 84 84 87 99 108*     No results found for this or any previous visit (from the past 240 hour(s)).       Radiology Studies: No results found.      Scheduled Meds:  amiodarone  100 mg Oral Daily   atorvastatin  80 mg Oral Daily   ezetimibe  10 mg Oral Daily   insulin aspart  0-9 Units Subcutaneous Q4H   irbesartan  75 mg Oral Daily   Continuous Infusions:  pantoprazole 8 mg/hr (07/09/23 0135)     LOS: 0 days    Time spent: over 35 min    Kendell Bane, MD [facility]   To contact the attending provider between 7A-7P or the covering provider during after hours 7P-7A, please log into the web site [email or web address] and access using universal [place] password for that web site. If you do not have the password, please call the hospital operator.  07/09/2023, 1:09 PM

## 2023-07-10 DIAGNOSIS — D696 Thrombocytopenia, unspecified: Secondary | ICD-10-CM | POA: Diagnosis not present

## 2023-07-10 DIAGNOSIS — I739 Peripheral vascular disease, unspecified: Secondary | ICD-10-CM | POA: Diagnosis not present

## 2023-07-10 DIAGNOSIS — K922 Gastrointestinal hemorrhage, unspecified: Secondary | ICD-10-CM

## 2023-07-10 DIAGNOSIS — I10 Essential (primary) hypertension: Secondary | ICD-10-CM | POA: Diagnosis not present

## 2023-07-10 LAB — GLUCOSE, CAPILLARY
Glucose-Capillary: 105 mg/dL — ABNORMAL HIGH (ref 70–99)
Glucose-Capillary: 106 mg/dL — ABNORMAL HIGH (ref 70–99)
Glucose-Capillary: 113 mg/dL — ABNORMAL HIGH (ref 70–99)
Glucose-Capillary: 137 mg/dL — ABNORMAL HIGH (ref 70–99)
Glucose-Capillary: 144 mg/dL — ABNORMAL HIGH (ref 70–99)
Glucose-Capillary: 80 mg/dL (ref 70–99)
Glucose-Capillary: 91 mg/dL (ref 70–99)
Glucose-Capillary: 96 mg/dL (ref 70–99)

## 2023-07-10 LAB — PROTIME-INR
INR: 2 — ABNORMAL HIGH (ref 0.8–1.2)
Prothrombin Time: 23.1 seconds — ABNORMAL HIGH (ref 11.4–15.2)

## 2023-07-10 MED ORDER — PEG-KCL-NACL-NASULF-NA ASC-C 100 G PO SOLR: 1.0000 | Freq: Once | ORAL | Status: DC

## 2023-07-10 MED ORDER — ADULT MULTIVITAMIN W/MINERALS CH
1.0000 | ORAL_TABLET | Freq: Every day | ORAL | Status: DC
  Administered 2023-07-10 – 2023-07-16 (×7): 1 via ORAL
  Filled 2023-07-10 (×7): qty 1

## 2023-07-10 MED ORDER — METOCLOPRAMIDE HCL 5 MG/ML IJ SOLN
10.0000 mg | Freq: Once | INTRAMUSCULAR | Status: AC
  Administered 2023-07-10: 10 mg via INTRAVENOUS
  Filled 2023-07-10: qty 2

## 2023-07-10 MED ORDER — PEG-KCL-NACL-NASULF-NA ASC-C 100 G PO SOLR
0.5000 | Freq: Once | ORAL | Status: AC
  Administered 2023-07-10: 100 g via ORAL
  Filled 2023-07-10: qty 1

## 2023-07-10 MED ORDER — PEG-KCL-NACL-NASULF-NA ASC-C 100 G PO SOLR: 0.5000 | Freq: Once | ORAL | Status: DC

## 2023-07-10 MED ORDER — PEG-KCL-NACL-NASULF-NA ASC-C 100 G PO SOLR
0.5000 | Freq: Once | ORAL | Status: DC
  Filled 2023-07-10: qty 1

## 2023-07-10 MED ORDER — PEG-KCL-NACL-NASULF-NA ASC-C 100 G PO SOLR
0.5000 | Freq: Once | ORAL | Status: AC
  Administered 2023-07-10: 100 g via ORAL

## 2023-07-10 NOTE — Progress Notes (Addendum)
Daily Progress Note  DOA: 07/07/2023 Hospital Day: 4 Chief Complaint:  rectal bleeding  ASSESSMENT & PLAN   Brief Narrative:  Jesus [name] is a 82 y.o. year old male with a history of diverticulosis, PAF on warfarin, chronic systolic CHF with last EF of 35-40%  Lower GI bleed on warfarin. Seems minor overall. Hemorrhoidal? Diverticular seems unlikely. No bleeding today -Hgb stable at 13.   -Clear liquids. May decide later today to prep for colonoscopy to be done tomorrow ( if Dr. Leone Payor comfortable with INR of 2). Also, if there is available time in Endo / Anesthesia. I discussed the risks / benefits of a colonoscopy and patient is agreeable.   PAF, warfarin on hold. -INR still at 2.0  Chronic thrombocytopenia, stable -platelets 137  ---------------------------------------------------------------------------------------------    Jesus [name] GI Attending   I have taken an interval history, reviewed the chart and examined the patient. I agree with the Advanced Practitioner's note, impression and recommendations + following:  Reviewed risks, benefits of colonoscopy w/ patient. Will proceed tomorrow to evaluate rectal bleeding.  [name], MD, [name] See Loretha Stapler on call - gastroenterology for best contact person 07/10/2023 4:05 PM   Subjective   No complaints. No bleeding today  Objective   Recent Labs    07/08/23 0445 07/09/23 0427 07/10/23 0422  WBC 5.6 6.9 7.2  HGB 12.5* 13.0 13.3  HCT 37.8* 40.2 40.8  PLT 124* 129* 137*   BMET Recent Labs    07/07/23 1738 07/08/23 0445 07/09/23 0427  NA 140 137 137  K 4.5 4.1 4.0  CL 104 106 104  CO2 26 25 22   GLUCOSE 112* 93 79  BUN 18 18 17   CREATININE 1.27* 1.17 1.23  CALCIUM 8.9 8.5* 8.5*   LFT Recent Labs    07/09/23 0427  PROT 6.0*  ALBUMIN 3.2*  AST 30  ALT 28  ALKPHOS 92  BILITOT 1.7*   PT/INR Recent Labs    07/09/23 1111 07/10/23 0838  LABPROT 23.0* 23.1*  INR  2.0* 2.0*     Imaging:  DG Chest 2 View CLINICAL DATA:  bibasila rales, L>R. Cough for 3 days, no other symptom.  EXAM: CHEST - 2 VIEW  COMPARISON:  Chest x-ray 04/04/2022  FINDINGS: The heart and mediastinal contours are unchanged. Aortic calcification. Aortic valve replacement. Surgical changes overlie the mediastinum with intact sternotomy wires.  Persistent elevation left hemidiaphragm. No focal consolidation. Chronic coarsened markings with no overt pulmonary edema. No pleural effusion. No pneumothorax.  No acute osseous abnormality.  IMPRESSION: 1. No active cardiopulmonary disease. 2.  Aortic Atherosclerosis (ICD10-I70.0).  Electronically Signed   By: Tish Frederickson M.D.   On: 10/28/2022 01:14     Scheduled inpatient medications:   amiodarone  100 mg Oral Daily   atorvastatin  80 mg Oral Daily   ezetimibe  10 mg Oral Daily   insulin aspart  0-9 Units Subcutaneous Q4H   irbesartan  75 mg Oral Daily   pantoprazole (PROTONIX) IV  40 mg Intravenous Q24H   Continuous inpatient infusions:  PRN inpatient medications: acetaminophen **OR** acetaminophen, ondansetron **OR** ondansetron (ZOFRAN) IV  Vital signs in last 24 hours: Temp:  [97.6 F (36.4 C)-98.1 F (36.7 C)] 98 F (36.7 C) (08/05 0732) Pulse Rate:  [55-61] 57 (08/05 0732) Resp:  [16-19] 18 (08/05 0732) BP: (113-144)/(42-52) 128/49 (08/05 0732) SpO2:  [91 %-100 %] 97 % (08/05 0732) Last BM Date : 07/08/23  Intake/Output Summary (Last 24  hours) at 07/10/2023 1034 Last data filed at 07/10/2023 0008 Gross per 24 hour  Intake 720 ml  Output 200 ml  Net 520 ml    Intake/Output from previous day: 08/04 0701 - 08/05 0700 In: 720 [P.O.:720] Out: 200 [Urine:200] Intake/Output this shift: No intake/output data recorded.   Physical Exam:  General: Alert male in NAD Heart:  Regular rate , + murmur Pulmonary: Normal respiratory effort Abdomen: Soft, nondistended, nontender. Normal bowel  sounds. Extremities: No lower extremity edema  Neurologic: Alert and oriented Psych: Pleasant. Cooperative. Insight appears normal.    Principal Problem:   Lower GI bleed Active Problems:   Essential hypertension   S/P CABG 1997   Coronary artery disease involving coronary bypass graft of native heart without angina pectoris   Chronic systolic CHF (congestive heart failure) (HCC)   Paroxysmal atrial fibrillation (HCC)   S/P angioplasty with stent 02/17/22 DES to proximal VG to PDA   Coagulopathy (HCC)   BRBPR (bright red blood per rectum)     LOS: 0 days   Willette Cluster ,NP 07/10/2023, 10:34 AM

## 2023-07-10 NOTE — Progress Notes (Signed)
PROGRESS NOTE Jesus [name]  NUU:[id number] DOB: [birth year] DOA: 07/07/2023 PCP: Kristian Covey, MD  Brief Narrative/Hospital Course:  82 y.o. male with medical history significant of PAF on coumadin and amiodarone, remote h/o TAVR, CAD s/p CABG 1997.  Here with bright red blood per rectum, started several days PTA GI was consulted and patient was admitted for further management   Subjective: Patient seen and examined this morning resting comfortably Past 24 hours heart rate in 50s to low 60s, BP stable, not hypoxic afebrile  Labs with hemoglobin 13.3 this morning, INR in 2 Reports she has had brown stool no new complaints   Assessment and Plan: Principal Problem:   Lower GI bleed Active Problems:   S/P CABG 1997   Coronary artery disease involving coronary bypass graft of native heart without angina pectoris   S/P angioplasty with stent 02/17/22 DES to proximal VG to PDA   Chronic systolic CHF (congestive heart failure) (HCC)   Paroxysmal atrial fibrillation (HCC)   Essential hypertension   Coagulopathy (HCC)   BRBPR (bright red blood per rectum)   BRBPR: In patient on chronic coumadin.  INR 2.3 6/2. Was on plavix previously, this was switched to ASA earlier last month due to being > 1 year out from DES. GI has been following closely possible colonoscopy prior to discharge once INR stable, likely prep today, INR up in 2. Recent Labs  Lab 07/07/23 1738 07/07/23 2045 07/08/23 0445 07/09/23 0427 07/10/23 0422  HGB 13.4 13.9 12.5* 13.0 13.3  HCT 41.7 43.0 37.8* 40.2 40.8    CAD: No chest pain.  Continue statin Holding aspirin and Coumadin in setting of acute GIB  Chronic systolic CHF ICM with baseline EF of 35%: Cont ARB, Not on any diuretics chronically it looks like.  History of TAVR for severe AS: Coumadin on hold  PAF: Rate controlled,in NSR. Coumadin on hold. Continue amiodarone    Essential hypertension Cont home BP meds   DVT prophylaxis: SCDs Start:  07/07/23 2213 Code Status:   Code Status: DNR Family Communication: plan of care discussed with patient at bedside. Patient status is:  admitted as observation but remains hospitalized for ongoing  because of brbpr Level of care: Telemetry Medical   Dispo: The patient is from: home            Anticipated disposition: tbd Objective: Vitals last 24 hrs: Vitals:   07/10/23 0001 07/10/23 0405 07/10/23 0450 07/10/23 0732  BP: (!) 131/52 (!) 125/42 (!) 113/47 (!) 128/49  Pulse: (!) 55 (!) 58 (!) 55 (!) 57  Resp: 18 16 18 18   Temp: 98.1 F (36.7 C) 97.7 F (36.5 C) 97.6 F (36.4 C) 98 F (36.7 C)  TempSrc: Oral Axillary Oral Oral  SpO2: 91% 97% 98% 97%  Weight:      Height:       Weight change:   Physical Examination: General exam: alert awake, older than stated age HEENT:Oral mucosa moist, Ear/Nose WNL grossly Respiratory system: bilaterally clear BS, no use of accessory muscle Cardiovascular system: S1 & S2 +, No JVD. Gastrointestinal system: Abdomen soft,NT,ND, BS+ Nervous System:Alert, awake, moving extremities. Extremities: LE edema neg,distal peripheral pulses palpable.  Skin: No rashes,no icterus. MSK: Normal muscle bulk,tone, power  Medications reviewed:  Scheduled Meds:  amiodarone  100 mg Oral Daily   atorvastatin  80 mg Oral Daily   ezetimibe  10 mg Oral Daily   insulin aspart  0-9 Units Subcutaneous Q4H   irbesartan  75 mg Oral  Daily   pantoprazole (PROTONIX) IV  40 mg Intravenous Q24H  Continuous Infusions:   Diet Order             Diet clear liquid Room service appropriate? Yes; Fluid consistency: Thin  Diet effective now                   Intake/Output Summary (Last 24 hours) at 07/10/2023 1027 Last data filed at 07/10/2023 0008 Gross per 24 hour  Intake 720 ml  Output 200 ml  Net 520 ml   Net IO Since Admission: 560.46 mL [07/10/23 1027]  Wt Readings from Last 3 Encounters:  07/08/23 54.9 kg  07/04/23 54.9 kg  06/15/23 55.2 kg      Unresulted Labs (From admission, onward)     Start     Ordered   07/10/23 0500  CBC  Daily,   R     Question:  Specimen collection method  Answer:  Lab=Lab collect   07/09/23 0845          Data Reviewed: I have personally reviewed following labs and imaging studies CBC: Recent Labs  Lab 07/07/23 1738 07/07/23 2045 07/08/23 0445 07/09/23 0427 07/10/23 0422  WBC 6.7  --  5.6 6.9 7.2  NEUTROABS  --   --   --  4.7  --   HGB 13.4 13.9 12.5* 13.0 13.3  HCT 41.7 43.0 37.8* 40.2 40.8  MCV 102.5*  --  101.1* 101.0* 99.3  PLT 144*  --  124* 129* 137*   Basic Metabolic Panel: Recent Labs  Lab 07/07/23 1738 07/08/23 0445 07/08/23 1009 07/09/23 0427  NA 140 137  --  137  K 4.5 4.1  --  4.0  CL 104 106  --  104  CO2 26 25  --  22  GLUCOSE 112* 93  --  79  BUN 18 18  --  17  CREATININE 1.27* 1.17  --  1.23  CALCIUM 8.9 8.5*  --  8.5*  MG  --   --  2.3 2.1  PHOS  --   --   --  4.1  Estimated Creatinine Clearance: 33.1 mL/min (by C-G formula based on SCr of 1.23 mg/dL). Recent Labs  Lab 07/07/23 1738 07/09/23 0427  AST 33 30  ALT 30 28  ALKPHOS 119 92  BILITOT 1.0 1.7*  PROT 6.7 6.0*  ALBUMIN 3.8 3.2*   Recent Labs  Lab 07/07/23 2045 07/09/23 1111 07/10/23 0838  INR 2.3* 2.0* 2.0*   Recent Labs    07/08/23 0445  HGBA1C 6.0*  CBG: Recent Labs  Lab 07/09/23 2026 07/10/23 0004 07/10/23 0411 07/10/23 0448 07/10/23 0806  GLUCAP 124* 80 96 113* 105*  No results found for this or any previous visit (from the past 240 hour(s)).  Antimicrobials: Anti-infectives (From admission, onward)    None     Culture/Microbiology    Component Value Date/Time   SDES BLOOD RIGHT HAND 02/28/2022 0058   SDES BLOOD RIGHT ANTECUBITAL 02/28/2022 0058   SPECREQUEST  02/28/2022 0058    BOTTLES DRAWN AEROBIC AND ANAEROBIC Blood Culture adequate volume   SPECREQUEST  02/28/2022 0058    BOTTLES DRAWN AEROBIC AND ANAEROBIC Blood Culture adequate volume   CULT  02/28/2022  0058    NO GROWTH 5 DAYS Performed at [facility], [address], [place], [place] [address]    CULT  02/28/2022 0058    NO GROWTH 5 DAYS Performed at [facility], [address] [place], [place] [address]    REPTSTATUS 03/05/2022 FINAL 02/28/2022 0058   REPTSTATUS 03/05/2022 FINAL 02/28/2022 0058  Other culture-see note Radiology Studies: No results found.   LOS: 0 days   Lanae Boast, MD [facility]  07/10/2023, 10:27 AM

## 2023-07-10 NOTE — Plan of Care (Signed)

## 2023-07-10 NOTE — Hospital Course (Addendum)
82 y.o. male with medical history significant of PAF on coumadin and amiodarone, remote h/o TAVR, CAD s/p CABG 1997.  Here with bright red blood per rectum, started several days PTA.GI was consulted and patient was admitted for further management Underwent colonoscopy 8/6-1 polyp in the distal rectum removed with cold forcep diverticulosis in the sigmoid colon, descending colon, internal hemorrhoid noted.  Coumadin was resumed 8/7 Patient appeared to have deconditioning, also had episode of fever workup unremarkable for infection.  PT OT consulted advised skilled nursing facility.  He had AKI given IV fluids and creatinine trending down.  At this time is stable awaiting for placement.. peer to peer review completed 8/10 and accepted for SNF

## 2023-07-10 NOTE — H&P (View-Only) (Signed)
Daily Progress Note  DOA: 07/07/2023 Hospital Day: 4 Chief Complaint:  rectal bleeding  ASSESSMENT & PLAN   Brief Narrative:  Jesus [name] is a 82 y.o. year old male with a history of diverticulosis, PAF on warfarin, chronic systolic CHF with last EF of 35-40%  Lower GI bleed on warfarin. Seems minor overall. Hemorrhoidal? Diverticular seems unlikely. No bleeding today -Hgb stable at 13.   -Clear liquids. May decide later today to prep for colonoscopy to be done tomorrow ( if Dr. Leone Payor comfortable with INR of 2). Also, if there is available time in Endo / Anesthesia. I discussed the risks / benefits of a colonoscopy and patient is agreeable.   PAF, warfarin on hold. -INR still at 2.0  Chronic thrombocytopenia, stable -platelets 137  ---------------------------------------------------------------------------------------------    [name]   I have taken an interval history, reviewed the chart and examined the patient. I agree with the Advanced Practitioner's note, impression and recommendations + following:  Reviewed risks, benefits of colonoscopy w/ patient. Will proceed tomorrow to evaluate rectal bleeding.  [name], MD, [name] See Loretha Stapler on call - gastroenterology for best contact person 07/10/2023 4:05 PM   Subjective   No complaints. No bleeding today  Objective   Recent Labs    07/08/23 0445 07/09/23 0427 07/10/23 0422  WBC 5.6 6.9 7.2  HGB 12.5* 13.0 13.3  HCT 37.8* 40.2 40.8  PLT 124* 129* 137*   BMET Recent Labs    07/07/23 1738 07/08/23 0445 07/09/23 0427  NA 140 137 137  K 4.5 4.1 4.0  CL 104 106 104  CO2 26 25 22   GLUCOSE 112* 93 79  BUN 18 18 17   CREATININE 1.27* 1.17 1.23  CALCIUM 8.9 8.5* 8.5*   LFT Recent Labs    07/09/23 0427  PROT 6.0*  ALBUMIN 3.2*  AST 30  ALT 28  ALKPHOS 92  BILITOT 1.7*   PT/INR Recent Labs    07/09/23 1111 07/10/23 0838  LABPROT 23.0* 23.1*  INR  2.0* 2.0*     Imaging:  DG Chest 2 View CLINICAL DATA:  bibasila rales, L>R. Cough for 3 days, no other symptom.  EXAM: CHEST - 2 VIEW  COMPARISON:  Chest x-ray 04/04/2022  FINDINGS: The heart and mediastinal contours are unchanged. Aortic calcification. Aortic valve replacement. Surgical changes overlie the mediastinum with intact sternotomy wires.  Persistent elevation left hemidiaphragm. No focal consolidation. Chronic coarsened markings with no overt pulmonary edema. No pleural effusion. No pneumothorax.  No acute osseous abnormality.  IMPRESSION: 1. No active cardiopulmonary disease. 2.  Aortic Atherosclerosis (ICD10-I70.0).  Electronically Signed   By: Tish Frederickson M.D.   On: 10/28/2022 01:14     Scheduled inpatient medications:   amiodarone  100 mg Oral Daily   atorvastatin  80 mg Oral Daily   ezetimibe  10 mg Oral Daily   insulin aspart  0-9 Units Subcutaneous Q4H   irbesartan  75 mg Oral Daily   pantoprazole (PROTONIX) IV  40 mg Intravenous Q24H   Continuous inpatient infusions:  PRN inpatient medications: acetaminophen **OR** acetaminophen, ondansetron **OR** ondansetron (ZOFRAN) IV  Vital signs in last 24 hours: Temp:  [97.6 F (36.4 C)-98.1 F (36.7 C)] 98 F (36.7 C) (08/05 0732) Pulse Rate:  [55-61] 57 (08/05 0732) Resp:  [16-19] 18 (08/05 0732) BP: (113-144)/(42-52) 128/49 (08/05 0732) SpO2:  [91 %-100 %] 97 % (08/05 0732) Last BM Date : 07/08/23  Intake/Output Summary (Last 24  hours) at 07/10/2023 1034 Last data filed at 07/10/2023 0008 Gross per 24 hour  Intake 720 ml  Output 200 ml  Net 520 ml    Intake/Output from previous day: 08/04 0701 - 08/05 0700 In: 720 [P.O.:720] Out: 200 [Urine:200] Intake/Output this shift: No intake/output data recorded.   Physical Exam:  General: Alert male in NAD Heart:  Regular rate , + murmur Pulmonary: Normal respiratory effort Abdomen: Soft, nondistended, nontender. Normal bowel  sounds. Extremities: No lower extremity edema  Neurologic: Alert and oriented Psych: Pleasant. Cooperative. Insight appears normal.    Principal Problem:   Lower GI bleed Active Problems:   Essential hypertension   S/P CABG 1997   Coronary artery disease involving coronary bypass graft of native heart without angina pectoris   Chronic systolic CHF (congestive heart failure) (HCC)   Paroxysmal atrial fibrillation (HCC)   S/P angioplasty with stent 02/17/22 DES to proximal VG to PDA   Coagulopathy (HCC)   BRBPR (bright red blood per rectum)     LOS: 0 days   Willette Cluster ,NP 07/10/2023, 10:34 AM

## 2023-07-11 ENCOUNTER — Encounter (HOSPITAL_COMMUNITY): Payer: Self-pay | Admitting: Internal Medicine

## 2023-07-11 ENCOUNTER — Encounter (HOSPITAL_COMMUNITY)
Admission: EM | Disposition: A | Discharge status: Skilled Nursing Facility | Payer: Self-pay | Source: Home / Self Care | Attending: Internal Medicine

## 2023-07-11 ENCOUNTER — Observation Stay (HOSPITAL_COMMUNITY): Payer: Medicare Other | Admitting: Certified Registered Nurse Anesthetist

## 2023-07-11 ENCOUNTER — Observation Stay (HOSPITAL_BASED_OUTPATIENT_CLINIC_OR_DEPARTMENT_OTHER): Payer: Medicare Other | Admitting: Certified Registered Nurse Anesthetist

## 2023-07-11 DIAGNOSIS — Z91041 Radiographic dye allergy status: Secondary | ICD-10-CM | POA: Diagnosis not present

## 2023-07-11 DIAGNOSIS — N189 Chronic kidney disease, unspecified: Secondary | ICD-10-CM | POA: Diagnosis not present

## 2023-07-11 DIAGNOSIS — K625 Hemorrhage of anus and rectum: Secondary | ICD-10-CM | POA: Diagnosis not present

## 2023-07-11 DIAGNOSIS — G4700 Insomnia, unspecified: Secondary | ICD-10-CM | POA: Diagnosis present

## 2023-07-11 DIAGNOSIS — K922 Gastrointestinal hemorrhage, unspecified: Secondary | ICD-10-CM | POA: Diagnosis not present

## 2023-07-11 DIAGNOSIS — D128 Benign neoplasm of rectum: Secondary | ICD-10-CM

## 2023-07-11 DIAGNOSIS — R059 Cough, unspecified: Secondary | ICD-10-CM | POA: Diagnosis not present

## 2023-07-11 DIAGNOSIS — D6959 Other secondary thrombocytopenia: Secondary | ICD-10-CM | POA: Diagnosis present

## 2023-07-11 DIAGNOSIS — I5022 Chronic systolic (congestive) heart failure: Secondary | ICD-10-CM | POA: Diagnosis not present

## 2023-07-11 DIAGNOSIS — I480 Paroxysmal atrial fibrillation: Secondary | ICD-10-CM | POA: Diagnosis not present

## 2023-07-11 DIAGNOSIS — I6381 Other cerebral infarction due to occlusion or stenosis of small artery: Secondary | ICD-10-CM | POA: Diagnosis not present

## 2023-07-11 DIAGNOSIS — R58 Hemorrhage, not elsewhere classified: Secondary | ICD-10-CM | POA: Diagnosis not present

## 2023-07-11 DIAGNOSIS — E785 Hyperlipidemia, unspecified: Secondary | ICD-10-CM | POA: Diagnosis not present

## 2023-07-11 DIAGNOSIS — D696 Thrombocytopenia, unspecified: Secondary | ICD-10-CM | POA: Diagnosis not present

## 2023-07-11 DIAGNOSIS — K621 Rectal polyp: Secondary | ICD-10-CM | POA: Diagnosis not present

## 2023-07-11 DIAGNOSIS — I6523 Occlusion and stenosis of bilateral carotid arteries: Secondary | ICD-10-CM | POA: Diagnosis not present

## 2023-07-11 DIAGNOSIS — R531 Weakness: Secondary | ICD-10-CM | POA: Diagnosis not present

## 2023-07-11 DIAGNOSIS — N179 Acute kidney failure, unspecified: Secondary | ICD-10-CM | POA: Diagnosis not present

## 2023-07-11 DIAGNOSIS — Z7901 Long term (current) use of anticoagulants: Secondary | ICD-10-CM | POA: Diagnosis not present

## 2023-07-11 DIAGNOSIS — I2489 Other forms of acute ischemic heart disease: Secondary | ICD-10-CM | POA: Diagnosis not present

## 2023-07-11 DIAGNOSIS — Z7984 Long term (current) use of oral hypoglycemic drugs: Secondary | ICD-10-CM | POA: Diagnosis not present

## 2023-07-11 DIAGNOSIS — Z66 Do not resuscitate: Secondary | ICD-10-CM | POA: Diagnosis not present

## 2023-07-11 DIAGNOSIS — I2510 Atherosclerotic heart disease of native coronary artery without angina pectoris: Secondary | ICD-10-CM | POA: Diagnosis not present

## 2023-07-11 DIAGNOSIS — K921 Melena: Secondary | ICD-10-CM | POA: Diagnosis not present

## 2023-07-11 DIAGNOSIS — I130 Hypertensive heart and chronic kidney disease with heart failure and stage 1 through stage 4 chronic kidney disease, or unspecified chronic kidney disease: Secondary | ICD-10-CM | POA: Diagnosis not present

## 2023-07-11 DIAGNOSIS — Z885 Allergy status to narcotic agent status: Secondary | ICD-10-CM | POA: Diagnosis not present

## 2023-07-11 DIAGNOSIS — K648 Other hemorrhoids: Secondary | ICD-10-CM | POA: Diagnosis not present

## 2023-07-11 DIAGNOSIS — K219 Gastro-esophageal reflux disease without esophagitis: Secondary | ICD-10-CM | POA: Diagnosis not present

## 2023-07-11 DIAGNOSIS — I499 Cardiac arrhythmia, unspecified: Secondary | ICD-10-CM | POA: Diagnosis not present

## 2023-07-11 DIAGNOSIS — R079 Chest pain, unspecified: Secondary | ICD-10-CM | POA: Diagnosis not present

## 2023-07-11 DIAGNOSIS — I5023 Acute on chronic systolic (congestive) heart failure: Secondary | ICD-10-CM | POA: Diagnosis not present

## 2023-07-11 DIAGNOSIS — I509 Heart failure, unspecified: Secondary | ICD-10-CM | POA: Diagnosis not present

## 2023-07-11 DIAGNOSIS — Z95820 Peripheral vascular angioplasty status with implants and grafts: Secondary | ICD-10-CM | POA: Diagnosis not present

## 2023-07-11 DIAGNOSIS — R519 Headache, unspecified: Secondary | ICD-10-CM | POA: Diagnosis not present

## 2023-07-11 DIAGNOSIS — I10 Essential (primary) hypertension: Secondary | ICD-10-CM | POA: Diagnosis not present

## 2023-07-11 DIAGNOSIS — I25810 Atherosclerosis of coronary artery bypass graft(s) without angina pectoris: Secondary | ICD-10-CM | POA: Diagnosis not present

## 2023-07-11 DIAGNOSIS — K5730 Diverticulosis of large intestine without perforation or abscess without bleeding: Secondary | ICD-10-CM

## 2023-07-11 DIAGNOSIS — D689 Coagulation defect, unspecified: Secondary | ICD-10-CM | POA: Diagnosis not present

## 2023-07-11 DIAGNOSIS — Z888 Allergy status to other drugs, medicaments and biological substances status: Secondary | ICD-10-CM | POA: Diagnosis not present

## 2023-07-11 DIAGNOSIS — I110 Hypertensive heart disease with heart failure: Secondary | ICD-10-CM | POA: Diagnosis not present

## 2023-07-11 DIAGNOSIS — I252 Old myocardial infarction: Secondary | ICD-10-CM | POA: Diagnosis not present

## 2023-07-11 DIAGNOSIS — E119 Type 2 diabetes mellitus without complications: Secondary | ICD-10-CM | POA: Diagnosis not present

## 2023-07-11 DIAGNOSIS — Z7982 Long term (current) use of aspirin: Secondary | ICD-10-CM | POA: Diagnosis not present

## 2023-07-11 DIAGNOSIS — Z79899 Other long term (current) drug therapy: Secondary | ICD-10-CM | POA: Diagnosis not present

## 2023-07-11 DIAGNOSIS — I429 Cardiomyopathy, unspecified: Secondary | ICD-10-CM | POA: Diagnosis not present

## 2023-07-11 DIAGNOSIS — Z743 Need for continuous supervision: Secondary | ICD-10-CM | POA: Diagnosis not present

## 2023-07-11 DIAGNOSIS — Z953 Presence of xenogenic heart valve: Secondary | ICD-10-CM | POA: Diagnosis not present

## 2023-07-11 DIAGNOSIS — Z7401 Bed confinement status: Secondary | ICD-10-CM | POA: Diagnosis not present

## 2023-07-11 DIAGNOSIS — R509 Fever, unspecified: Secondary | ICD-10-CM | POA: Diagnosis not present

## 2023-07-11 HISTORY — PX: COLONOSCOPY WITH PROPOFOL: SHX5780

## 2023-07-11 HISTORY — PX: POLYPECTOMY: SHX5525

## 2023-07-11 HISTORY — PX: HEMOSTASIS CONTROL: SHX6838

## 2023-07-11 HISTORY — PX: HEMOSTASIS CLIP PLACEMENT: SHX6857

## 2023-07-11 LAB — GLUCOSE, CAPILLARY
Glucose-Capillary: 112 mg/dL — ABNORMAL HIGH (ref 70–99)
Glucose-Capillary: 112 mg/dL — ABNORMAL HIGH (ref 70–99)
Glucose-Capillary: 92 mg/dL (ref 70–99)
Glucose-Capillary: 116 mg/dL — ABNORMAL HIGH (ref 70–99)
Glucose-Capillary: 114 mg/dL — ABNORMAL HIGH (ref 70–99)

## 2023-07-11 LAB — PROTIME-INR
INR: 2.1 — ABNORMAL HIGH (ref 0.8–1.2)
Prothrombin Time: 23.4 seconds — ABNORMAL HIGH (ref 11.4–15.2)

## 2023-07-11 SURGERY — COLONOSCOPY WITH PROPOFOL
Anesthesia: Monitor Anesthesia Care

## 2023-07-11 MED ORDER — SODIUM CHLORIDE 0.9 % IV SOLN: INTRAVENOUS | Status: DC

## 2023-07-11 MED ORDER — PROPOFOL 10 MG/ML IV BOLUS
INTRAVENOUS | Status: DC | PRN
Start: 2023-07-11 — End: 2023-07-11
  Administered 2023-07-11 (×2): 20 mg via INTRAVENOUS

## 2023-07-11 MED ORDER — LACTATED RINGERS IV SOLN: INTRAVENOUS | Status: DC | PRN

## 2023-07-11 MED ORDER — PROPOFOL 500 MG/50ML IV EMUL
INTRAVENOUS | Status: DC | PRN
  Administered 2023-07-11: 100 ug/kg/min via INTRAVENOUS

## 2023-07-11 MED ORDER — LIDOCAINE 2% (20 MG/ML) 5 ML SYRINGE
INTRAMUSCULAR | Status: DC | PRN
  Administered 2023-07-11: 40 mg via INTRAVENOUS

## 2023-07-11 MED ORDER — EPHEDRINE SULFATE-NACL 50-0.9 MG/10ML-% IV SOSY
PREFILLED_SYRINGE | INTRAVENOUS | Status: DC | PRN
  Administered 2023-07-11: 5 mg via INTRAVENOUS

## 2023-07-11 SURGICAL SUPPLY — 22 items

## 2023-07-11 NOTE — Progress Notes (Signed)
PROGRESS NOTE Jesus [name]  WRU:[id number] DOB: [birth year] DOA: 07/07/2023 PCP: Kristian Covey, MD  Brief Narrative/Hospital Course:  82 y.o. male with medical history significant of PAF on coumadin and amiodarone, remote h/o TAVR, CAD s/p CABG 1997.  Here with bright red blood per rectum, started several days PTA.GI was consulted and patient was admitted for further management   Subjective: Seen and examined Overnight afebrile not hypoxic  Labs this morning with hemoglobin stable at 14.7 g Eating his lunch Had scope this am  Assessment and Plan: Principal Problem:   Lower GI bleed Active Problems:   S/P CABG 1997   Coronary artery disease involving coronary bypass graft of native heart without angina pectoris   S/P angioplasty with stent 02/17/22 DES to proximal VG to PDA   Chronic systolic CHF (congestive heart failure) (HCC)   Paroxysmal atrial fibrillation (HCC)   Essential hypertension   Coagulopathy (HCC)   BRBPR (bright red blood per rectum)   Rectal polyp   BRBPR: In patient on chronic coumadin.  INR 2.3 6/2. Was on plavix previously, this was switched to ASA earlier last month due to being > 1 year out from DES. GI input appreciated Underwent colonoscopy 8/6-1 polyp in the distal rectum removed with cold forcep diverticulosis in the sigmoid colon, descending colon, internal hemorrhoid noted, post colonoscopy diet ordered advised to observe 1 more day and okay to resume warfarin 8/7 if stable and will need outpatient GI follow-up Recent Labs  Lab 07/07/23 2045 07/08/23 0445 07/09/23 0427 07/10/23 0422 07/10/23 2358  HGB 13.9 12.5* 13.0 13.3 14.7  HCT 43.0 37.8* 40.2 40.8 44.8    CAD: No chest pain. Cont statin Holding aspirin and Coumadin in setting of acute GIB, ot resume in am if stable.  Chronic systolic CHF ICM with baseline EF of 35%: Cont ARB, Not on any diuretics.  Volume status stable w/ Net IO Since Admission: 760.46 mL [07/11/23 1135]   History  of TAVR for severe AS: Stable.  PAF: Rate controlled,in NSR. Coumadin on hold. Continue amiodarone Recent Labs  Lab 07/07/23 2045 07/09/23 1111 07/10/23 0838 07/11/23 0832  INR 2.3* 2.0* 2.0* 2.1*      Essential hypertension BP well-controlled on irbesartan   DVT prophylaxis: SCDs Start: 07/07/23 2213 Code Status:   Code Status: DNR Family Communication: plan of care discussed with patient at bedside. Patient status is:  admitted as observation > changed to inpatient as he remains hospitalized for ongoing  because of brbpr Level of care: Telemetry Medical   Dispo: The patient is from: home            Anticipated disposition: Home tomorrow if no GI bleeding and hemoglobin stable Objective: Vitals last 24 hrs: Vitals:   07/11/23 0906 07/11/23 1003 07/11/23 1010 07/11/23 1020  BP: (!) 151/47 (!) 102/40 (!) 103/44 (!) 144/54  Pulse: 67 66 76 69  Resp: 15 (!) 27 (!) 23 (!) 24  Temp: 98.4 F (36.9 C) 98.2 F (36.8 C)    TempSrc: Temporal Temporal    SpO2: 97% 97% 96% 97%  Weight: 55 kg     Height: 5\' 1"  (1.549 m)      Weight change:   Physical Examination: General exam: alert awake, oriented at baseline, older than stated age HEENT:Oral mucosa moist, Ear/Nose WNL grossly Respiratory system: Bilaterally clear BS,no use of accessory muscle Cardiovascular system: S1 & S2 +, No JVD. Gastrointestinal system: Abdomen soft,NT,ND, BS+ Nervous System: Alert, awake, moving all extremities,and following commands.  Extremities: LE edema neg,distal peripheral pulses palpable and warm.  Skin: No rashes,no icterus. MSK: Normal muscle bulk,tone, power   Medications reviewed:  Scheduled Meds:  amiodarone  100 mg Oral Daily   atorvastatin  80 mg Oral Daily   ezetimibe  10 mg Oral Daily   insulin aspart  0-9 Units Subcutaneous Q4H   irbesartan  75 mg Oral Daily   multivitamin with minerals  1 tablet Oral Daily   pantoprazole (PROTONIX) IV  40 mg Intravenous Q24H  Continuous  Infusions:     Diet Order             Diet Heart Room service appropriate? Yes; Fluid consistency: Thin  Diet effective now                   Intake/Output Summary (Last 24 hours) at 07/11/2023 1135 Last data filed at 07/11/2023 0958 Gross per 24 hour  Intake 200 ml  Output --  Net 200 ml    Net IO Since Admission: 760.46 mL [07/11/23 1135]  Wt Readings from Last 3 Encounters:  07/11/23 55 kg  07/04/23 54.9 kg  06/15/23 55.2 kg     Unresulted Labs (From admission, onward)     Start     Ordered   07/12/23 0500  Protime-INR  Daily,   R     Question:  Specimen collection method  Answer:  Lab=Lab collect   07/11/23 0750   07/10/23 0500  CBC  Daily,   R     Question:  Specimen collection method  Answer:  Lab=Lab collect   07/09/23 0845          Data Reviewed: I have personally reviewed following labs and imaging studies CBC: Recent Labs  Lab 07/07/23 1738 07/07/23 2045 07/08/23 0445 07/09/23 0427 07/10/23 0422 07/10/23 2358  WBC 6.7  --  5.6 6.9 7.2 11.3*  NEUTROABS  --   --   --  4.7  --   --   HGB 13.4 13.9 12.5* 13.0 13.3 14.7  HCT 41.7 43.0 37.8* 40.2 40.8 44.8  MCV 102.5*  --  101.1* 101.0* 99.3 100.2*  PLT 144*  --  124* 129* 137* 152   Basic Metabolic Panel: Recent Labs  Lab 07/07/23 1738 07/08/23 0445 07/08/23 1009 07/09/23 0427  NA 140 137  --  137  K 4.5 4.1  --  4.0  CL 104 106  --  104  CO2 26 25  --  22  GLUCOSE 112* 93  --  79  BUN 18 18  --  17  CREATININE 1.27* 1.17  --  1.23  CALCIUM 8.9 8.5*  --  8.5*  MG  --   --  2.3 2.1  PHOS  --   --   --  4.1  Estimated Creatinine Clearance: 33.1 mL/min (by C-G formula based on SCr of 1.23 mg/dL). Recent Labs  Lab 07/07/23 1738 07/09/23 0427  AST 33 30  ALT 30 28  ALKPHOS 119 92  BILITOT 1.0 1.7*  PROT 6.7 6.0*  ALBUMIN 3.8 3.2*   Recent Labs  Lab 07/07/23 2045 07/09/23 1111 07/10/23 0838 07/11/23 0832  INR 2.3* 2.0* 2.0* 2.1*   No results for input(s): "HGBA1C" in the  last 72 hours. CBG: Recent Labs  Lab 07/10/23 1619 07/10/23 2009 07/10/23 2357 07/11/23 0439 07/11/23 0813  GLUCAP 91 106* 137* 112* 112*  No results found for this or any previous visit (from the past 240 hour(s)).  Antimicrobials: Anti-infectives (From  admission, onward)    None     Culture/Microbiology    Component Value Date/Time   SDES BLOOD RIGHT HAND 02/28/2022 0058   SDES BLOOD RIGHT ANTECUBITAL 02/28/2022 0058   SPECREQUEST  02/28/2022 0058    BOTTLES DRAWN AEROBIC AND ANAEROBIC Blood Culture adequate volume   SPECREQUEST  02/28/2022 0058    BOTTLES DRAWN AEROBIC AND ANAEROBIC Blood Culture adequate volume   CULT  02/28/2022 0058    NO GROWTH 5 DAYS Performed at [facility], [address], [place], [place] [address]    CULT  02/28/2022 0058    NO GROWTH 5 DAYS Performed at [facility], [address] [place], [place] [address]    REPTSTATUS 03/05/2022 FINAL 02/28/2022 0058   REPTSTATUS 03/05/2022 FINAL 02/28/2022 0058  Other culture-see note Radiology Studies: No results found.   LOS: 0 days   Lanae Boast, MD [facility]  07/11/2023, 11:35 AM

## 2023-07-11 NOTE — Anesthesia Preprocedure Evaluation (Addendum)
Anesthesia Evaluation  Patient identified by MRN, date of birth, ID band Patient awake    Reviewed: Allergy & Precautions, H&P , NPO status , Patient's Chart, lab work & pertinent test results  Airway Mallampati: III  TM Distance: >3 FB Neck ROM: Full    Dental no notable dental hx. (+) Teeth Intact, Dental Advisory Given   Pulmonary neg pulmonary ROS   Pulmonary exam normal breath sounds clear to auscultation       Cardiovascular hypertension, Pt. on medications + CAD, + Past MI, + Cardiac Stents, + CABG and +CHF  + dysrhythmias Atrial Fibrillation  Rhythm:Regular Rate:Normal     Neuro/Psych  Headaches  negative psych ROS   GI/Hepatic Neg liver ROS,GERD  ,,  Endo/Other  negative endocrine ROS    Renal/GU negative Renal ROS  negative genitourinary   Musculoskeletal   Abdominal   Peds  Hematology  (+) Blood dyscrasia, anemia   Anesthesia Other Findings   Reproductive/Obstetrics negative OB ROS                             Anesthesia Physical Anesthesia Plan  ASA: 3  Anesthesia Plan: MAC   Post-op Pain Management: Minimal or no pain anticipated   Induction: Intravenous  PONV Risk Score and Plan: 1 and Propofol infusion  Airway Management Planned: Natural Airway and Simple Face Mask  Additional Equipment:   Intra-op Plan:   Post-operative Plan:   Informed Consent: I have reviewed the patients History and Physical, chart, labs and discussed the procedure including the risks, benefits and alternatives for the proposed anesthesia with the patient or authorized representative who has indicated his/her understanding and acceptance.   Patient has DNR.  Discussed DNR with patient and Suspend DNR.   Dental advisory given  Plan Discussed with: CRNA  Anesthesia Plan Comments:        Anesthesia Quick Evaluation

## 2023-07-11 NOTE — Progress Notes (Signed)
Pt was found on floor of room, next to bed, alert. Patient was assisted back into bed by RN's, not c/o pain. When asked what happened, patient could not state why he got out of bed. No obvious injuries, vitals WNL. Patient educated on call light, to not get out of bed unassisted, bed alarm turned on. MD Dayna Barker notified, family notified.

## 2023-07-11 NOTE — Op Note (Signed)
[facility] Patient Name: Jesus [name] Procedure Date : 07/11/2023 MRN: [id number] Attending MD: [name] , MD, [id number] Date of Birth: Apr 09, 1941 CSN: [id number] Age: 82 Admit Type: Inpatient Procedure:                Colonoscopy Indications:              Rectal bleeding, on warfarin (held) - INR 2.1 Providers:                [name], MD, Adin Hector, RN, Fransisca Connors, Marja Kays, Technician Referring MD:              Medicines:                Monitored Anesthesia Care Complications:            No immediate complications. Estimated Blood Loss:     Estimated blood loss was minimal. Procedure:                Pre-Anesthesia Assessment:                           - Prior to the procedure, a History and Physical                            was performed, and patient medications and                            allergies were reviewed. The patient's tolerance of                            previous anesthesia was also reviewed. The risks                            and benefits of the procedure and the sedation                            options and risks were discussed with the patient.                            All questions were answered, and informed consent                            was obtained. Prior Anticoagulants: The patient                            last took Coumadin (warfarin) 4 days prior to the                            procedure. ASA Grade Assessment: III - A patient                            with severe systemic disease. After reviewing the  risks and benefits, the patient was deemed in                            satisfactory condition to undergo the procedure.                           After obtaining informed consent, the colonoscope                            was passed under direct vision. Throughout the                            procedure, the patient's blood pressure, pulse,  and                            oxygen saturations were monitored continuously. The                            CF-HQ190L ([id number] Olympus colonoscope was                            introduced through the anus and advanced to the the                            cecum, identified by appendiceal orifice and                            ileocecal valve. The colonoscopy was performed                            without difficulty. The patient tolerated the                            procedure well. The quality of the bowel                            preparation was adequate. The ileocecal valve,                            appendiceal orifice, and rectum were photographed. Scope In: 9:30:56 AM Scope Out: 9:55:14 AM Scope Withdrawal Time: 0 hours 17 minutes 44 seconds  Total Procedure Duration: 0 hours 24 minutes 18 seconds  Findings:      The perianal and digital rectal examinations were normal.      A 4 mm polyp was found in the distal rectum. The polyp was       semi-pedunculated and vascular. The polyp was removed with a cold biopsy       forceps by grasping at the base and was removed in toto.[name] Resection and       retrieval were complete. Verification of patient identification for the       specimen was done. Estimated blood loss was minimal but there was       persistent oozing.. To prevent bleeding after the polypectomy, Purastat       was applied and 1 one hemostatic clip was successfully  placed. Clip       manufacturer: [employer]. There was no bleeding at the end of the       procedure.      Multiple diverticula were found in the sigmoid colon and descending       colon.      Internal hemorrhoids were found.      The exam was otherwise without abnormality on direct and retroflexion       views. Impression:               - One 4 mm polyp in the distal rectum, removed with                            a cold biopsy forceps. Vascular polyp and bleeding                             after removal occurred (minor but persistent -                            expected based upon appearance) Resected and                            retrieved. Purastat applied and one clip was                            placed. Clip manufacturer: [employer]. - I                            believe this vascular polypid lesion was source of                            bleeding that led to admission.                           - Diverticulosis in the sigmoid colon and in the                            descending colon.                           - Internal hemorrhoids.                           - The examination was otherwise normal on direct                            and retroflexion views. Recommendation:           - Start heart healthy diet (ordered)                           Observe today and if ok tomorrow resume warfarin                           If ok tomorrow - home  I will f/u path and notify patient from office                            after dc                           No outpatient GI appointment needed at this time,                            based upon what I know. Procedure Code(s):        --- Professional ---                           [phone], Colonoscopy, flexible; with biopsy, single                            or multiple Diagnosis Code(s):        --- Professional ---                           D12.8, Benign neoplasm of rectum                           K64.8, Other hemorrhoids                           K62.5, Hemorrhage of anus and rectum                           K57.30, Diverticulosis of large intestine without                            perforation or abscess without bleeding CPT copyright 2022 [employer]. All rights reserved. The codes documented in this report are preliminary and upon coder review may  be revised to meet current compliance requirements. [name], MD 07/11/2023 10:14:37 AM This report has been signed  electronically. Number of Addenda: 0

## 2023-07-11 NOTE — Anesthesia Postprocedure Evaluation (Signed)
Anesthesia Post Note  Patient: Jesus [name]  Procedure(s) Performed: COLONOSCOPY WITH PROPOFOL POLYPECTOMY HEMOSTASIS CLIP PLACEMENT HEMOSTASIS CONTROL     Patient location during evaluation: Endoscopy Anesthesia Type: MAC Level of consciousness: awake and alert Pain management: pain level controlled Vital Signs Assessment: post-procedure vital signs reviewed and stable Respiratory status: spontaneous breathing, nonlabored ventilation and respiratory function stable Cardiovascular status: stable and blood pressure returned to baseline Postop Assessment: no apparent nausea or vomiting Anesthetic complications: no  No notable events documented.  Last Vitals:  Vitals:   07/11/23 1010 07/11/23 1020  BP: (!) 103/44 (!) 144/54  Pulse: 76 69  Resp: (!) 23 (!) 24  Temp:    SpO2: 96% 97%    Last Pain:  Vitals:   07/11/23 1020  TempSrc:   PainSc: 0-No pain                 ,W. EDMOND

## 2023-07-11 NOTE — Interval H&P Note (Signed)
History and Physical Interval Note:  07/11/2023 9:24 AM  Jesus [name]  has presented today for surgery, with the diagnosis of evaluation of rectal bleeding.  The various methods of treatment have been discussed with the patient and family. After consideration of risks, benefits and other options for treatment, the patient has consented to  Procedure(s): COLONOSCOPY WITH PROPOFOL (N/A) as a surgical intervention.  The patient's history has been reviewed, patient examined, no change in status, stable for surgery.  I have reviewed the patient's chart and labs.  Questions were answered to the patient's satisfaction.     Stan Head

## 2023-07-11 NOTE — Plan of Care (Signed)

## 2023-07-11 NOTE — Transfer of Care (Signed)
Immediate Anesthesia Transfer of Care Note  Patient: Jesus [name]  Procedure(s) Performed: COLONOSCOPY WITH PROPOFOL POLYPECTOMY HEMOSTASIS CLIP PLACEMENT HEMOSTASIS CONTROL  Patient Location: Endoscopy Unit  Anesthesia Type:MAC  Level of Consciousness: drowsy  Airway & Oxygen Therapy: Patient Spontanous Breathing and Patient connected to nasal cannula oxygen  Post-op Assessment: Report given to RN and Post -op Vital signs reviewed and stable  Post vital signs: Reviewed and stable  Last Vitals:  Vitals Value Taken Time  BP 151/47 07/11/23 0906  Temp 36.9 C 07/11/23 0906  Pulse 67 07/11/23 0906  Resp 15 07/11/23 0906  SpO2 97 % 07/11/23 0906    Last Pain:  Vitals:   07/11/23 0906  TempSrc: Temporal  PainSc: 0-No pain         Complications: No notable events documented.

## 2023-07-12 ENCOUNTER — Inpatient Hospital Stay (HOSPITAL_COMMUNITY): Payer: Medicare Other

## 2023-07-12 ENCOUNTER — Encounter: Payer: Self-pay | Admitting: Internal Medicine

## 2023-07-12 DIAGNOSIS — K621 Rectal polyp: Secondary | ICD-10-CM

## 2023-07-12 DIAGNOSIS — K625 Hemorrhage of anus and rectum: Secondary | ICD-10-CM | POA: Diagnosis not present

## 2023-07-12 DIAGNOSIS — K922 Gastrointestinal hemorrhage, unspecified: Secondary | ICD-10-CM | POA: Diagnosis not present

## 2023-07-12 LAB — URINALYSIS, ROUTINE W REFLEX MICROSCOPIC
Bilirubin Urine: NEGATIVE
Glucose, UA: NEGATIVE mg/dL
Hgb urine dipstick: NEGATIVE
Ketones, ur: 5 mg/dL — AB
Leukocytes,Ua: NEGATIVE
Nitrite: NEGATIVE
Protein, ur: 30 mg/dL — AB
Specific Gravity, Urine: 1.021 (ref 1.005–1.030)
pH: 5 (ref 5.0–8.0)

## 2023-07-12 LAB — GLUCOSE, CAPILLARY
Glucose-Capillary: 100 mg/dL — ABNORMAL HIGH (ref 70–99)
Glucose-Capillary: 105 mg/dL — ABNORMAL HIGH (ref 70–99)
Glucose-Capillary: 113 mg/dL — ABNORMAL HIGH (ref 70–99)
Glucose-Capillary: 119 mg/dL — ABNORMAL HIGH (ref 70–99)
Glucose-Capillary: 169 mg/dL — ABNORMAL HIGH (ref 70–99)
Glucose-Capillary: 89 mg/dL (ref 70–99)

## 2023-07-12 LAB — BASIC METABOLIC PANEL
Anion gap: 12 (ref 5–15)
BUN: 20 mg/dL (ref 8–23)
CO2: 25 mmol/L (ref 22–32)
Calcium: 8.5 mg/dL — ABNORMAL LOW (ref 8.9–10.3)
Chloride: 100 mmol/L (ref 98–111)
Creatinine, Ser: 1.68 mg/dL — ABNORMAL HIGH (ref 0.61–1.24)
GFR, Estimated: 40 mL/min — ABNORMAL LOW (ref >60)
Glucose, Bld: 97 mg/dL (ref 70–99)
Potassium: 4.2 mmol/L (ref 3.5–5.1)
Sodium: 137 mmol/L (ref 135–145)

## 2023-07-12 LAB — CBC
HCT: 38 % — ABNORMAL LOW (ref 39.0–52.0)
Hemoglobin: 12.6 g/dL — ABNORMAL LOW (ref 13.0–17.0)
MCH: 34.1 pg — ABNORMAL HIGH (ref 26.0–34.0)
MCHC: 33.2 g/dL (ref 30.0–36.0)
MCV: 102.7 fL — ABNORMAL HIGH (ref 80.0–100.0)
Platelets: 91 10*3/uL — ABNORMAL LOW (ref 150–400)
RBC: 3.7 MIL/uL — ABNORMAL LOW (ref 4.22–5.81)
RDW: 13.7 % (ref 11.5–15.5)
WBC: 5.8 10*3/uL (ref 4.0–10.5)
nRBC: 0 % (ref 0.0–0.2)

## 2023-07-12 LAB — CULTURE, BLOOD (ROUTINE X 2)
Culture: NO GROWTH
Culture: NO GROWTH
Special Requests: ADEQUATE
Special Requests: ADEQUATE

## 2023-07-12 MED ORDER — SODIUM CHLORIDE 0.9 % IV SOLN: INTRAVENOUS | Status: AC

## 2023-07-12 MED ORDER — GUAIFENESIN ER 600 MG PO TB12
600.0000 mg | ORAL_TABLET | Freq: Two times a day (BID) | ORAL | Status: DC
  Administered 2023-07-12 – 2023-07-16 (×8): 600 mg via ORAL
  Filled 2023-07-12 (×8): qty 1

## 2023-07-12 MED ORDER — MELATONIN 3 MG PO TABS
3.0000 mg | ORAL_TABLET | Freq: Every evening | ORAL | Status: DC | PRN
  Administered 2023-07-12 – 2023-07-13 (×2): 3 mg via ORAL
  Filled 2023-07-12 (×2): qty 1

## 2023-07-12 MED ORDER — WARFARIN SODIUM 4 MG PO TABS
4.0000 mg | ORAL_TABLET | Freq: Once | ORAL | Status: AC
  Administered 2023-07-12: 4 mg via ORAL
  Filled 2023-07-12: qty 1

## 2023-07-12 MED ORDER — BENZONATATE 100 MG PO CAPS
200.0000 mg | ORAL_CAPSULE | Freq: Three times a day (TID) | ORAL | Status: DC | PRN
  Administered 2023-07-13: 200 mg via ORAL
  Filled 2023-07-12 (×2): qty 2

## 2023-07-12 MED ORDER — WARFARIN - PHARMACIST DOSING INPATIENT: Freq: Every day | Status: DC

## 2023-07-12 NOTE — Progress Notes (Signed)
Pharmacy Consult for heparin gtt  Indication: atrial fibrillation  Allergies  Allergen Reactions   Amlodipine Other (See Comments)    Swelling?   Codeine Other (See Comments)    Pt does not remember   Iodine Swelling    Patient Measurements: Height: 5\' 1"  (154.9 cm) Weight: 55 kg (121 lb 4.1 oz) IBW/kg (Calculated) : 52.3   Vital Signs: Temp: 99.2 F (37.3 C) (08/07 0744) Temp Source: Oral (08/07 0744) BP: 129/44 (08/07 0744) Pulse Rate: 69 (08/07 0744)  Labs: Recent Labs    07/10/23 0422 07/10/23 8295 07/10/23 2358 07/11/23 0832 07/12/23 0406 07/12/23 0830  HGB 13.3  --  14.7  --  12.6*  --   HCT 40.8  --  44.8  --  38.0*  --   PLT 137*  --  152  --  91*  --   LABPROT  --  23.1*  --  23.4* 21.7*  --   INR  --  2.0*  --  2.1* 1.9*  --   CREATININE  --   --   --   --   --  1.68*    Estimated Creatinine Clearance: 24.2 mL/min (A) (by C-G formula based on SCr of 1.68 mg/dL (H)).  Assessment: Jesus [name] a 82 y.o. male presented with concern for GIB and found to have bleeding from distant rectal polyp. S/P colonoscopy 8/6 and cleared by GI to resume warfarin.  Patient was taking warfarin outpatient for h/o PAF- LD 8/1, CBC OK, INR 1.9 on 8/7.   Anticoagulation PTA: warfarin 4 mg daily, except for 2 mg Mon/Fri   Goal of Therapy:  INR 2-3 Monitor platelets by anticoagulation protocol: Yes   Plan:  Warfarin 4 mg PO x1 per PTA regimen  INR/CBC daily  Monitor for signs and symptoms of bleeding   Jani Gravel, PharmD Clinical Pharmacist  07/12/2023 12:01 PM

## 2023-07-12 NOTE — TOC Progression Note (Signed)
Transition of Care [facility]) - Progression Note    Patient Details  Name: Jesus [name] MRN: [id number] Date of Birth: [birth year]  [facility]) CM/SW Contact  Janae Bridgeman, RN Phone Number: 07/12/2023, 4:20 PM  Clinical Narrative:    CM met with the patient at the bedside today after patient was evaluated by PT/OT.  Therapy recommended short term rehabilitation for the patient.  I provided the patient with Medicare choice regarding SNF placement and the patient states that he would like to go to [facility] in [place] in possible.  Patient lives alone and has no children.  I called and left a message with his niece, Synetta Fail by phone to update at patient's request.  FL2 was completed and patient was faxed out in the hub.  I called and updated Rosemary Holms, CM with [facility] to review patient's clinicals in the hub for possible bed offers.  MSW to follow with patient regarding bed offers.   Expected Discharge Plan: Home w Home Health Services Barriers to Discharge: Continued Medical Work up  Expected Discharge Plan and Services                                               Social Determinants of Health (SDOH) Interventions SDOH Screenings   Food Insecurity: No Food Insecurity (07/08/2023)  Housing: Low Risk  (07/08/2023)  Transportation Needs: No Transportation Needs (07/08/2023)  Utilities: Not At Risk (07/08/2023)  Alcohol Screen: Low Risk  (07/28/2022)  Depression (PHQ2-9): Low Risk  (09/07/2022)  Financial Resource Strain: Low Risk  (01/24/2023)  Physical Activity: Sufficiently Active (07/28/2022)  Social Connections: Moderately Integrated (07/28/2022)  Stress: No Stress Concern Present (07/28/2022)  Tobacco Use: Low Risk  (07/11/2023)    Readmission Risk Interventions    07/12/2023    9:03 AM 03/04/2022    3:49 PM  Readmission Risk Prevention Plan  Post Dischage Appt Complete   Medication Screening Complete   Transportation Screening Complete  Complete  Medication Review (RN Care Manager)  Complete  PCP or Specialist appointment within 3-5 days of discharge  Complete  HRI or Home Care Consult  Complete  SW Recovery Care/Counseling Consult  Complete  Palliative Care Screening  Not Applicable  Skilled Nursing Facility  Complete

## 2023-07-12 NOTE — Progress Notes (Signed)
Patient was taken for CT via bed at 0940 AM and back on the floor at 1020 AM.

## 2023-07-12 NOTE — Progress Notes (Signed)
   Patient Name: Jesus [name] Date of Encounter: 07/12/2023, 9:45 AM     Assessment and Plan  Rectal bleeding from distal rectal polyp (removed 8/6) - bleeding has stopped  Slight anemia  Warfarin Tx   ----------------------------------------------------------------------------------------------------  I do not think fever is GI issue - ? Atelectasis  Bleeding has stopped at this point and Hgb essentially stable  Am signing off  May resume warfarin  I will notify re: polyp pathology and any GI f/u but no outpatient GI f/u needed at this time  [name], MD, [facility] See Loretha Stapler on call - gastroenterology for best contact person 07/12/2023 10:01 AM   Subjective  Fever and fell overnight Fever w/u neg so far No pain To get head CT   Objective  BP (!) 129/44 (BP Location: Right Arm)   Pulse 69   Temp 99.2 F (37.3 C) (Oral)   Resp 17   Ht 5\' 1"  (1.549 m)   Wt 55 kg   SpO2 99%   BMI 22.91 kg/m  Elderly wm NAD - does not remember falling  Abd soft NT  Recent Labs  Lab 07/10/23 0422 07/10/23 2358 07/12/23 0406  HGB 13.3 14.7 12.6*  HCT 40.8 44.8 38.0*  WBC 7.2 11.3* 5.8  PLT 137* 152 91*   Lab Results  Component Value Date   INR 1.9 (H) 07/12/2023   INR 2.1 (H) 07/11/2023   INR 2.0 (H) 07/10/2023   PROTIME 18.7 05/06/2009    Colonoscopy 07/11/23 - One 4 mm polyp in the distal rectum, removed with a cold biopsy forceps. Vascular polyp and bleeding after removal occurred (minor but persistent - expected based upon appearance) Resected and retrieved. Purastat applied and one clip was placed. Clip manufacturer: [employer]. - I believe this vascular polypid lesion was source of bleeding that led to admission. - Diverticulosis in the sigmoid colon and in the descending colon. - Internal hemorrhoids. - The examination was otherwise normal on direct and retroflexion views. Vivia Birmingham, MD, [facility]  Gastroenterology See Loretha Stapler on call - gastroenterology for best contact person 07/12/2023 9:45 AM

## 2023-07-12 NOTE — Progress Notes (Signed)
Transition of Care [facility]) - Inpatient Brief Assessment   Patient Details  Name: Jesus [name] MRN: [id number] Date of Birth: [birth year]  Transition of Care [facility]) CM/SW Contact:    Janae Bridgeman, RN Phone Number: 07/12/2023, 9:04 AM   Clinical Narrative: Patient admitted to the hospital with gastrointestinal hemorrhage.  Patient lives alone but was independent prior to admission with family in the area.  CM asked that PT/OT evaluation be ordered prior to discharge since patient fell in the hospital after this procedure and he lives alone.  Patient states that he drove to the hospital.  I called and left a message with family while I was in the room to advise that he have family help with transportation home.  Patient has [employer] at home but does not use.  CM will continue to follow the patient for potential need for PT for home health.   Transition of Care Asessment: Insurance and Status: (P) Insurance coverage has been reviewed Patient has primary care physician: (P) Yes Home environment has been reviewed: (P) Yes - lives home alone Prior level of function:: (P) Independent prior to admission Prior/Current Home Services: (P) No current home services Social Determinants of Health Reivew: (P) SDOH reviewed no interventions necessary Readmission risk has been reviewed: (P) Yes Transition of care needs: (P) transition of care needs identified, TOC will continue to follow

## 2023-07-12 NOTE — Progress Notes (Signed)
Incentive spirometry introduced with teach back 

## 2023-07-12 NOTE — Progress Notes (Signed)
TRH night cross cover note:   I was notified by RN that the patient is complaining of a cough that is preventing him from sleeping.  I subsequently placed orders for prn Tessalon Perles as well as scheduled guaifenesin.  Additionally, I placed order for prn melatonin for insomnia.     Jesus Pigg, DO Hospitalist

## 2023-07-12 NOTE — Progress Notes (Signed)
PROGRESS NOTE Jesus [name]  ZDG:[id number] DOB: 07/12/1941 DOA: 07/07/2023 PCP: Kristian Covey, MD  Brief Narrative/Hospital Course:  82 y.o. male with medical history significant of PAF on coumadin and amiodarone, remote h/o TAVR, CAD s/p CABG 1997.  Here with bright red blood per rectum, started several days PTA.GI was consulted and patient was admitted for further management Underwent colonoscopy 8/6-1 polyp in the distal rectum removed with cold forcep diverticulosis in the sigmoid colon, descending colon, internal hemorrhoid noted  Subjective: Patient seen and examined this morning Overnight noted to have fever of 101.1 Patient was found on the floor a lot without obvious injury 8/6 evening Appears somewhat forgetful Creatinine is uptrending  Assessment and Plan: Principal Problem:   Lower GI bleed Active Problems:   S/P CABG 1997   Coronary artery disease involving coronary bypass graft of native heart without angina pectoris   S/P angioplasty with stent 02/17/22 DES to proximal VG to PDA   Chronic systolic CHF (congestive heart failure) (HCC)   Paroxysmal atrial fibrillation (HCC)   Essential hypertension   Coagulopathy (HCC)   BRBPR (bright red blood per rectum)   Rectal polyp   BRBPR: In the setting of anticoagulation on Coumadin INR 2.3 6/2. Was on plavix previously, this was switched to ASA earlier last month due to being > 1 year out from DES. Seen by GI s/p Colonoscopy 8/6-1 polyp in the distal rectum removed with cold forcep diverticulosis in the sigmoid colon, descending colon, internal hemorrhoid noted.  GI okay with resuming Coumadin 8/7 if stable and no bleeding h.h 12.6 gm same as admission although up in 13-14 inbetween.  GI okay with resuming Coumadin pharmacy consulted Recent Labs  Lab 07/08/23 0445 07/09/23 0427 07/10/23 0422 07/10/23 2358 07/12/23 0406  HGB 12.5* 13.0 13.3 14.7 12.6*  HCT 37.8* 40.2 40.8 44.8 38.0*    Febrile episode x 1: Workup  started with chest x-ray UA blood culture.  Chest x-ray unremarkable.  UA with 5 ketones rare bacteria WBC 25, monitor temperature curve holding off on antibiotics for now GI feels less likely GI issues, question atelectasis, add incentive spirometry, ambulate.  ? Fall Deconditioning/debility: Found on the floor 8/6, without obvious injury or complaints: PT OT has been requested, cont fall precautions.  CT head no acute finding  AKI: creatinine uptrending will start on IV fluid hydration, hold his ARB avoid nephrotoxic medication and monitor Recent Labs    09/13/22 1444 10/19/22 1349 11/04/22 1045 06/05/23 0928 07/07/23 1738 07/08/23 0445 07/09/23 0427 07/12/23 0830  BUN 14 18 18 21 18 18 17 20   CREATININE 1.09 1.19 1.07 1.14 1.27* 1.17 1.23 1.68*  CO2 22 24 29 24 26 25 22 25      CAD: No chest pain. Cont statin. Asa coumadin held 2/2 #1  Chronic systolic CHF ICM with baseline EF of 35%: Volume status overall stable. holding ARB, Not on any diuretics. Net IO Since Admission: 360.46 mL [07/12/23 1150]   History of TAVR for severe AS: Stable.  PAF: Rate controlled,in NSR. Coumadin will be resumed per GI recommendation pharmacy consulted.  Continue  Home amiodarone Recent Labs  Lab 07/07/23 2045 07/09/23 1111 07/10/23 0838 07/11/23 0832 07/12/23 0406  INR 2.3* 2.0* 2.0* 2.1* 1.9*     Essential hypertension BP well-controlled on irbesartan   DVT prophylaxis: SCDs Start: 07/07/23 2213 Code Status:   Code Status: DNR Family Communication: plan of care discussed with patient at bedside. Patient status is:  admitted as observation > changed  to inpatient as he remains hospitalized for ongoing  because of brbpr Level of care: Telemetry Medical   Dispo: The patient is from: home            Anticipated disposition: Home tomorrow if no GI bleeding and hemoglobin stable Objective: Vitals last 24 hrs: Vitals:   07/12/23 0025 07/12/23 0211 07/12/23 0438 07/12/23 0744  BP:  (!) 116/38  (!) 103/34 (!) 129/44  Pulse: 72  63 69  Resp: 18  18 17   Temp: (!) 101.1 F (38.4 C) 100 F (37.8 C) 99.5 F (37.5 C) 99.2 F (37.3 C)  TempSrc:  Oral  Oral  SpO2: 99%  99% 99%  Weight:      Height:       Weight change: 0.1 kg  Physical Examination:  General exam: alert awake, forgetful pleasant conversant  HEENT:Oral mucosa moist, Ear/Nose WNL grossly Respiratory system: Bilaterally clear BS,no use of accessory muscle Cardiovascular system: S1 & S2 +, No JVD. Gastrointestinal system: Abdomen soft,NT,ND, BS+ Nervous System: Alert, awake, moving all extremities,and following commands. Extremities: LE edema neg,distal peripheral pulses palpable and warm.  Skin: No rashes,no icterus. MSK: Normal muscle bulk,tone, power   Medications reviewed:  Scheduled Meds:  amiodarone  100 mg Oral Daily   atorvastatin  80 mg Oral Daily   ezetimibe  10 mg Oral Daily   insulin aspart  0-9 Units Subcutaneous Q4H   multivitamin with minerals  1 tablet Oral Daily   pantoprazole (PROTONIX) IV  40 mg Intravenous Q24H  Continuous Infusions:  sodium chloride 75 mL/hr at 07/12/23 1134    Diet Order             Diet Heart Room service appropriate? Yes; Fluid consistency: Thin  Diet effective now                   Intake/Output Summary (Last 24 hours) at 07/12/2023 1150 Last data filed at 07/12/2023 1000 Gross per 24 hour  Intake --  Output 400 ml  Net -400 ml    Net IO Since Admission: 360.46 mL [07/12/23 1150]  Wt Readings from Last 3 Encounters:  07/11/23 55 kg  07/04/23 54.9 kg  06/15/23 55.2 kg     Unresulted Labs (From admission, onward)     Start     Ordered   07/12/23 0500  Protime-INR  Daily,   R     Question:  Specimen collection method  Answer:  Lab=Lab collect   07/11/23 0750          Data Reviewed: I have personally reviewed following labs and imaging studies CBC: Recent Labs  Lab 07/08/23 0445 07/09/23 0427 07/10/23 0422 07/10/23 2358  07/12/23 0406  WBC 5.6 6.9 7.2 11.3* 5.8  NEUTROABS  --  4.7  --   --   --   HGB 12.5* 13.0 13.3 14.7 12.6*  HCT 37.8* 40.2 40.8 44.8 38.0*  MCV 101.1* 101.0* 99.3 100.2* 102.7*  PLT 124* 129* 137* 152 91*   Basic Metabolic Panel: Recent Labs  Lab 07/07/23 1738 07/08/23 0445 07/08/23 1009 07/09/23 0427 07/12/23 0830  NA 140 137  --  137 137  K 4.5 4.1  --  4.0 4.2  CL 104 106  --  104 100  CO2 26 25  --  22 25  GLUCOSE 112* 93  --  79 97  BUN 18 18  --  17 20  CREATININE 1.27* 1.17  --  1.23 1.68*  CALCIUM 8.9 8.5*  --  8.5* 8.5*  MG  --   --  2.3 2.1  --   PHOS  --   --   --  4.1  --   Estimated Creatinine Clearance: 24.2 mL/min (A) (by C-G formula based on SCr of 1.68 mg/dL (H)). Recent Labs  Lab 07/07/23 1738 07/09/23 0427  AST 33 30  ALT 30 28  ALKPHOS 119 92  BILITOT 1.0 1.7*  PROT 6.7 6.0*  ALBUMIN 3.8 3.2*   Recent Labs  Lab 07/07/23 2045 07/09/23 1111 07/10/23 0838 07/11/23 0832 07/12/23 0406  INR 2.3* 2.0* 2.0* 2.1* 1.9*   No results for input(s): "HGBA1C" in the last 72 hours. CBG: Recent Labs  Lab 07/11/23 2055 07/12/23 0022 07/12/23 0403 07/12/23 0745 07/12/23 1135  GLUCAP 114* 113* 100* 89 169*   Recent Results (from the past 240 hour(s))  Culture, blood (Routine X 2) w Reflex to ID Panel     Status: None (Preliminary result)   Collection Time: 07/12/23  4:05 AM   Specimen: BLOOD RIGHT ARM  Result Value Ref Range Status   Specimen Description BLOOD RIGHT ARM  Final   Special Requests   Final    BOTTLES DRAWN AEROBIC AND ANAEROBIC Blood Culture adequate volume   Culture   Final    NO GROWTH < 12 HOURS Performed at [facility], [address], [place], [place] [address]    Report Status PENDING  Incomplete  Culture, blood (Routine X 2) w Reflex to ID Panel     Status: None (Preliminary result)   Collection Time: 07/12/23  4:08 AM   Specimen: BLOOD RIGHT HAND  Result Value Ref Range Status   Specimen Description BLOOD RIGHT  HAND  Final   Special Requests   Final    BOTTLES DRAWN AEROBIC AND ANAEROBIC Blood Culture adequate volume   Culture   Final    NO GROWTH < 12 HOURS Performed at [facility], [address], [place], [place] [address]    Report Status PENDING  Incomplete    Antimicrobials: Anti-infectives (From admission, onward)    None     Culture/Microbiology    Component Value Date/Time   SDES BLOOD RIGHT HAND 07/12/2023 0408   SPECREQUEST  07/12/2023 0408    BOTTLES DRAWN AEROBIC AND ANAEROBIC Blood Culture adequate volume   CULT  07/12/2023 0408    NO GROWTH < 12 HOURS Performed at [facility], [address], [place], [place] [address]    REPTSTATUS PENDING 07/12/2023 0408  Other culture-see note Radiology Studies: CT HEAD WO CONTRAST ( )  Result Date: 07/12/2023 CLINICAL DATA:  Headache EXAM: CT HEAD WITHOUT CONTRAST TECHNIQUE: Contiguous axial images were obtained from the base of the skull through the vertex without intravenous contrast. RADIATION DOSE REDUCTION: This exam was performed according to the departmental dose-optimization program which includes automated exposure control, adjustment of the mA and/or kV according to patient size and/or use of iterative reconstruction technique. COMPARISON:  Brain MRI 09/21/2007 FINDINGS: Brain: There is no acute intracranial hemorrhage, extra-axial fluid collection, or acute territorial infarct. Background parenchymal volume is normal for age. The ventricles are normal in size. Small remote appearing infarcts are noted in the left basal ganglia. No cortical encephalomalacia is seen. There is no mass lesion. The pituitary and suprasellar region are normal. There is no mass effect or midline shift. Vascular: There is calcification of the bilateral carotid siphons and vertebral arteries. Skull: Normal. Negative for fracture or focal lesion. Sinuses/Orbits: There  is mild mucosal thickening in the paranasal sinuses. Bilateral lens  implants are in place. The globes and orbits are otherwise unremarkable. Other: The mastoid air cells and middle ear cavities are clear. IMPRESSION: 1. No acute intracranial pathology. 2. 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 Electronically Signed   By: Lesia Hausen M.D.   On: 07/12/2023 10:41   DG CHEST PORT 1 VIEW  Result Date: 07/12/2023 CLINICAL DATA:  Fever EXAM: PORTABLE CHEST 1 VIEW COMPARISON:  10/25/2022 FINDINGS: Claudie Fisherman overlies the lung apices. Chronic interstitial thickening again noted. No superimposed confluent pulmonary infiltrate. No definite pneumothorax. No pleural effusion. Coronary artery bypass grafting and transcatheter aortic valve replacement have been performed. Cardiac size is within normal limits. No acute bone abnormality. IMPRESSION: 1. No radiographic evidence of acute cardiopulmonary disease. Electronically Signed   By: Helyn Numbers M.D.   On: 07/12/2023 02:30     LOS: 1 day   Lanae Boast, MD [facility]  07/12/2023, 11:50 AM

## 2023-07-12 NOTE — Plan of Care (Signed)

## 2023-07-12 NOTE — Progress Notes (Signed)
Night cross-coverage  Patient febrile with temperature 101.1 F and was given Tylenol.  Remainder of vital signs stable.  I have ordered workup including CBC, UA, chest x-ray, and blood cultures.

## 2023-07-12 NOTE — NC FL2 (Signed)
[place] MEDICAID FL2 LEVEL OF CARE FORM     IDENTIFICATION  Patient Name: Jesus [name] Birthdate: Nov 20, 1941 Sex: male Admission Date (Current Location): 07/07/2023  [facility] and [place] Number:  Producer, television/film/video and Address:  [facility], [address], [place], [place] [address]      Provider Number: [id number]  Attending Physician Name and Address:  Lanae Boast, MD  Relative Name and Phone Number:       Current Level of Care: Hospital Recommended Level of Care: Skilled Nursing Facility Prior Approval Number:    Date Approved/Denied:   PASRR Number: [id number]  Discharge Plan: SNF    Current Diagnoses: Patient Active Problem List   Diagnosis Date Noted   Rectal polyp 07/11/2023   Coagulopathy (HCC) 07/09/2023   BRBPR (bright red blood per rectum) 07/09/2023   Lower GI bleed 07/07/2023   Bilateral rales 10/25/2022   VT (ventricular tachycardia) (HCC) - 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-largely asymptomatic 10/04/2022   A-fib (HCC) 04/01/2022   Bradycardia 03/15/2022   Debility 03/08/2022   Acute kidney injury superimposed on chronic kidney disease (HCC) 03/01/2022   Acute on chronic systolic congestive heart failure (HCC) 02/27/2022   Paroxysmal atrial fibrillation (HCC) 02/20/2022   S/P angioplasty with stent 02/17/22 DES to proximal VG to PDA 02/20/2022   NSTEMI (non-ST elevated myocardial infarction) (HCC) 02/15/2022   Chronic systolic CHF (congestive heart failure) (HCC) 04/16/2018   Long term (current) use of anticoagulants 08/25/2017   Actinic keratoses 03/21/2017   Internal hemorrhoid, bleeding 07/01/2015   Rectal bleeding 06/15/2015   Internal hemorrhoids 06/15/2015   Other constipation 06/15/2015   Chronic anticoagulation-Couamdin 03/12/2015   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   Iron deficiency anemia 09/03/2014   Encounter for therapeutic drug  monitoring 12/31/2013   Yellow jacket sting 08/06/2013   Low back pain on right side with sciatica 04/24/2013   Severe aortic stenosis 08/10/2012   Coronary artery disease involving coronary bypass graft of native heart without angina pectoris 08/10/2012   Left ventricular dysfunction 08/10/2012   Routine health maintenance 07/12/2012   BEE STING REACTION, LOCAL 09/21/2010   BEN LOC HYPERPLASIA PROS W/UR OBST & OTH LUTS 08/02/2010   Acute and chronic cholecystitis 02/01/2010   Abdominal pain, generalized 01/29/2010   CORONARY ATHEROSCLEROSIS NATIVE CORONARY ARTERY 10/13/2009   HYPERTHYROIDISM 05/22/2009   INSOMNIA 05/22/2009   Mixed hyperlipidemia 04/22/2009   Cardiomyopathy, ischemic-EF 35% 04/22/2009   CAD, AUTOLOGOUS BYPASS GRAFT 10/16/2008   Essential hypertension 09/07/2007   Aortic valve disorder 09/07/2007   GERD without esophagitis 09/07/2007   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    Orientation RESPIRATION BLADDER Height & Weight     Self, Situation, Place  Normal Continent Weight: 55 kg Height:  5\' 1"  (154.9 cm)  BEHAVIORAL SYMPTOMS/MOOD NEUROLOGICAL BOWEL NUTRITION STATUS      Continent Diet  AMBULATORY STATUS COMMUNICATION OF NEEDS Skin   Limited Assist Verbally Skin abrasions (bilateral legs)                       Personal Care Assistance Level of Assistance  Bathing, Feeding, Dressing, Total care Bathing Assistance: Limited assistance Feeding assistance: Independent Dressing Assistance: Limited assistance Total Care Assistance: Independent   Functional Limitations Info  Sight, Hearing, Speech Sight Info: Impaired (wears reading glasses) Hearing Info: Impaired (hard of hearing) Speech Info: Adequate    SPECIAL CARE FACTORS FREQUENCY  PT (By licensed PT), OT (  By licensed OT)     PT Frequency: 5 x per week OT Frequency: 5 x per week            Contractures Contractures Info: Not present    Additional Factors Info  Code Status, Insulin Sliding Scale,  Allergies Code Status Info: DNR Allergies Info: amlodipine, codeine, iodine   Insulin Sliding Scale Info: Novolog Insulin - sensitive sliding scale       Current Medications (07/12/2023):  This is the current hospital active medication list Current Facility-Administered Medications  Medication Dose Route Frequency Provider Last Rate Last Admin   0.9 %  sodium chloride infusion   Intravenous Continuous Kc, Ramesh, MD 75 mL/hr at 07/12/23 1134 New Bag at 07/12/23 1134   acetaminophen (TYLENOL) tablet 650 mg  650 mg Oral Q6H PRN [name], MD   650 mg at 07/12/23 0040   Or   acetaminophen (TYLENOL) suppository 650 mg  650 mg Rectal Q6H PRN [name], MD       amiodarone (PACERONE) tablet 100 mg  100 mg Oral Daily [name], MD   100 mg at 07/12/23 0939   atorvastatin (LIPITOR) tablet 80 mg  80 mg Oral Daily [name], MD   80 mg at 07/12/23 4401   ezetimibe (ZETIA) tablet 10 mg  10 mg Oral Daily [name], MD   10 mg at 07/12/23 0272   insulin aspart (novoLOG) injection 0-9 Units  0-9 Units Subcutaneous Q4H [name], MD   2 Units at 07/12/23 1229   multivitamin with minerals tablet 1 tablet  1 tablet Oral Daily [name], MD   1 tablet at 07/12/23 0939   ondansetron (ZOFRAN) tablet 4 mg  4 mg Oral Q6H PRN [name], MD       Or   ondansetron [facility]) injection 4 mg  4 mg Intravenous Q6H PRN [name], MD       pantoprazole (PROTONIX) injection 40 mg  40 mg Intravenous Q24H [name], MD   40 mg at 07/12/23 1425   warfarin (COUMADIN) tablet 4 mg  4 mg Oral ONCE-1600 Paytes, Florentina Addison, RPH       Warfarin - Pharmacist Dosing Inpatient   Does not apply q1600 Ishmael Holter, [facility]         Discharge Medications: Please see discharge summary for a list of discharge medications.  Relevant Imaging Results:  Relevant Lab Results:   Additional Information SS# [id number]  Janae Bridgeman, RN

## 2023-07-12 NOTE — Evaluation (Signed)
Physical Therapy Evaluation Patient Details Name: Jesus [name] MRN: [id number] DOB: 10-16-1941 Today's Date: 07/12/2023  History of Present Illness  Pt is an 82 y.o. male presenting from PCP 07/07/2023 with bright red blood in stool. Now s/p colonoscopy 8/6. PMH includes: PAF on coumadin and amiodarone, remote h/o TAVR, CAD s/p CABG 1997, NSTEMI 02/20/22 s/p cardiac cath with stent placement, ischemic cardiomyopathy, HLD, HTN.  Clinical Impression  Pt admitted with/for red blood in stool.  Presently, pt is not at baseline and shows weakness and decreased balance.  Pt currently limited functionally due to the problems listed. ( See problems list.)   Pt will benefit from PT to maximize function and safety in order to get ready for next venue listed below.         If plan is discharge home, recommend the following: A little help with walking and/or transfers;A little help with bathing/dressing/bathroom;Assistance with cooking/housework   Can travel by private vehicle   Yes    Equipment Recommendations Rolling walker (2 wheels)  Recommendations for Other Services       Functional Status Assessment Patient has had a recent decline in their functional status and demonstrates the ability to make significant improvements in function in a reasonable and predictable amount of time.     Precautions / Restrictions Precautions Precautions: Fall Precaution Comments: watch BP Restrictions Weight Bearing Restrictions: No      Mobility  Bed Mobility Overal bed mobility: Needs Assistance Bed Mobility: Supine to Sit     Supine to sit: Min assist     General bed mobility comments: up in the recliner on arrival and returned to recliner.    Transfers Overall transfer level: Needs assistance Equipment used: 1 person hand held assist Transfers: Sit to/from Stand, Bed to chair/wheelchair/BSC Sit to Stand: Min assist   Step pivot transfers: Contact guard assist       General transfer  comment: min stability assist and assist forward    Ambulation/Gait Ambulation/Gait assistance: Min assist Gait Distance (Feet): 150 Feet (x2) Assistive device: Rolling walker (2 wheels) Gait Pattern/deviations: Step-through pattern Gait velocity: decreased Gait velocity interpretation: <1.8 ft/sec, indicate of risk for recurrent falls   General Gait Details: mildly unsteady in the RW. some drift L/R with mild list to the R.  Some posterior lean more than retropulsion.  Stairs            Wheelchair Mobility     Tilt Bed    Modified Rankin (Stroke Patients Only)       Balance Overall balance assessment: Needs assistance Sitting-balance support: No upper extremity supported, Feet supported Sitting balance-Leahy Scale: Fair     Standing balance support: Single extremity supported, During functional activity, No upper extremity supported Standing balance-Leahy Scale: Poor Standing balance comment: reliant on external support of at least one UE                             Pertinent Vitals/Pain Pain Assessment Pain Assessment: No/denies pain    Home Living Family/patient expects to be discharged to:: Private residence Living Arrangements: Alone Available Help at Discharge: Family;Available PRN/intermittently (sister in law) Type of Home: House Home Access: Stairs to enter   [employer] of Steps: 1 (if entering from garage)   Home Layout: Two level;Other (Comment) (basement down stairs with apartment. Pt reports he does not have to go down there but he likes to) Home Equipment: Cane - single point;Shower seat -  built in      Prior Function Prior Level of Function : Independent/Modified Independent;Driving;Working/employed             Mobility Comments: no AD ADLs Comments: Cleans transmissions and mows lawn at [employer]     Extremity/Trunk Assessment   Upper Extremity Assessment Upper Extremity Assessment: Generalized  weakness    Lower Extremity Assessment Lower Extremity Assessment: Generalized weakness    Cervical / Trunk Assessment Cervical / Trunk Assessment: Kyphotic  Communication   Communication Communication: Hearing impairment Cueing Techniques: Verbal cues;Gestural cues;Visual cues  Cognition Arousal: Alert Behavior During Therapy: WFL for tasks assessed/performed Overall Cognitive Status: Difficult to assess Area of Impairment: Attention, Memory, Following commands, Safety/judgement, Awareness, Problem solving                   Current Attention Level: Sustained, Focused (max externally distracted) Memory: Decreased short-term memory Following Commands: Follows one step commands consistently, Follows one step commands with increased time Safety/Judgement: Decreased awareness of safety Awareness: Emergent (Self observation stating "Oh, I am a little off" when mobilizing in room in regard to his balance.) Problem Solving: Slow processing, Requires verbal cues General Comments: Pt pleasant and conversational. Questionably oriented to situation "I fell" and pt did fall yesterday while in hospital, but questionably oriented to what brought pt in (GI bleed). Pt following all commands given incresaed time but unable to attend to therapist voice if also visually distracted (when therapist adjusted lines/leads/moved IV pole, etc).        General Comments General comments (skin integrity, edema, etc.): VSS    Exercises     Assessment/Plan    PT Assessment Patient needs continued PT services  PT Problem List Decreased strength;Decreased activity tolerance;Decreased balance;Decreased mobility;Decreased knowledge of use of DME       PT Treatment Interventions DME instruction;Gait training;Functional mobility training;Therapeutic activities;Neuromuscular re-education    PT Goals (Current goals can be found in the Care Plan section)  Acute Rehab PT Goals Patient Stated Goal:  eventually get home PT Goal Formulation: With patient Potential to Achieve Goals: Good    Frequency Min 4X/week     Co-evaluation               AM-PAC PT "6 Clicks" Mobility  Outcome Measure Help needed turning from your back to your side while in a flat bed without using bedrails?: A Little Help needed moving from lying on your back to sitting on the side of a flat bed without using bedrails?: A Little Help needed moving to and from a bed to a chair (including a wheelchair)?: A Little Help needed standing up from a chair using your arms (e.g., wheelchair or bedside chair)?: A Little Help needed to walk in hospital room?: A Little Help needed climbing 3-5 steps with a railing? : A Lot 6 Click Score: 17    End of Session   Activity Tolerance: Patient tolerated treatment well Patient left: in chair;with call bell/phone within reach;with chair alarm set Nurse Communication: Mobility status PT Visit Diagnosis: Muscle weakness (generalized) (M62.81);Unsteadiness on feet (R26.81)    Time: 1610-9604 PT Time Calculation (min) (ACUTE ONLY): 28 min   Charges:   PT Evaluation $PT Eval Moderate Complexity: 1 Mod PT Treatments $Gait Training: 8-22 mins PT General Charges $$ ACUTE PT VISIT: 1 Visit         07/12/2023  Jacinto Halim., PT Acute Rehabilitation Services [phone]  (office)  Eliseo Gum  07/12/2023, 4:24 PM

## 2023-07-12 NOTE — Evaluation (Signed)
Occupational Therapy Evaluation Patient Details Name: Jesus [name] MRN: [id number] DOB: 12-01-1941 Today's Date: 07/12/2023   History of Present Illness Pt is an 82 y.o. male presenting from PCP 07/07/2023 with bright red blood in stool. Now s/p colonoscopy 8/6. PMH includes: PAF on coumadin and amiodarone, remote h/o TAVR, CAD s/p CABG 1997, NSTEMI 02/20/22 s/p cardiac cath with stent placement, ischemic cardiomyopathy, HLD, HTN.   Clinical Impression   PTA, pt lived alone and was independent in ADL, IADL, working, and driving. Upon eval, cognition difficult to assess due to pt hard of hearing, but pt max externally distracted requiring cues to attend to mobility, ADL, and conversation as well as minimizing external distractors. Pt requiring grossly min A for transfers and bed mobility. Progressing to CGA with mobility but observed to reach out to furniture several times during mobility. Pt with fair awareness of changes in mobility observed to state "oh this is not the same" during ambulation to chair. Will continue to follow acutely and patient will benefit from continued inpatient follow up therapy, <3 hours/day. Pending progress, may be able to go home.       If plan is discharge home, recommend the following: A little help with walking and/or transfers;A little help with bathing/dressing/bathroom;Assistance with cooking/housework;Direct supervision/assist for medications management;Direct supervision/assist for financial management;Assist for transportation;Help with stairs or ramp for entrance    Functional Status Assessment  Patient has had a recent decline in their functional status and demonstrates the ability to make significant improvements in function in a reasonable and predictable amount of time.  Equipment Recommendations  Other (comment) (defer)    Recommendations for Other Services       Precautions / Restrictions Precautions Precautions: Fall Precaution Comments: watch  BP Restrictions Weight Bearing Restrictions: No      Mobility Bed Mobility Overal bed mobility: Needs Assistance Bed Mobility: Supine to Sit     Supine to sit: Min assist     General bed mobility comments: for truncal elevation and scooting out toward EOB    Transfers Overall transfer level: Needs assistance Equipment used: 1 person hand held assist Transfers: Sit to/from Stand, Bed to chair/wheelchair/BSC Sit to Stand: Min assist     Step pivot transfers: Contact guard assist     General transfer comment: Min A to rise. CGA for pivot to chair.      Balance Overall balance assessment: Needs assistance Sitting-balance support: No upper extremity supported, Feet supported Sitting balance-Leahy Scale: Fair     Standing balance support: Single extremity supported, During functional activity, No upper extremity supported Standing balance-Leahy Scale: Poor Standing balance comment: reliant on external support of at least one UE                           ADL either performed or assessed with clinical judgement   ADL Overall ADL's : Needs assistance/impaired Eating/Feeding: Modified independent;Sitting   Grooming: Set up;Sitting   Upper Body Bathing: Sitting;Set up   Lower Body Bathing: Minimal assistance;Sit to/from stand   Upper Body Dressing : Set up;Sitting   Lower Body Dressing: Minimal assistance;Sit to/from stand   Toilet Transfer: Minimal assistance;Ambulation;BSC/3in1 Statistician Details (indicate cue type and reason): Min A for initial rise and steadying Toileting- Clothing Manipulation and Hygiene: Minimal assistance;Sit to/from stand Toileting - Clothing Manipulation Details (indicate cue type and reason): for rise and steadying while pt was performing pericare     Functional mobility during ADLs: Minimal assistance  Vision Baseline Vision/History: 1 Wears glasses Ability to See in Adequate Light: 0 Adequate Patient Visual  Report: No change from baseline Vision Assessment?: Wears glasses for driving Additional Comments: Pt denies visual changes.     Perception         Praxis         Pertinent Vitals/Pain Pain Assessment Pain Assessment: No/denies pain     Extremity/Trunk Assessment Upper Extremity Assessment Upper Extremity Assessment: Generalized weakness   Lower Extremity Assessment Lower Extremity Assessment: Defer to PT evaluation   Cervical / Trunk Assessment Cervical / Trunk Assessment: Kyphotic   Communication Communication Communication: Hearing impairment Cueing Techniques: Verbal cues;Gestural cues;Visual cues   Cognition Arousal: Alert Behavior During Therapy: WFL for tasks assessed/performed Overall Cognitive Status: Difficult to assess Area of Impairment: Attention, Memory, Following commands, Safety/judgement, Awareness, Problem solving                   Current Attention Level: Sustained, Focused (max externally distracted) Memory: Decreased short-term memory Following Commands: Follows one step commands consistently, Follows one step commands with increased time Safety/Judgement: Decreased awareness of safety Awareness: Emergent (Self observation stating "Oh, I am a little off" when mobilizing in room in regard to his balance.) Problem Solving: Slow processing, Requires verbal cues General Comments: Pt pleasant and conversational. Questionably oriented to situation "I fell" and pt did fall yesterday while in hospital, but questionably oriented to what brought pt in (GI bleed). Pt following all commands given incresaed time but unable to attend to therapist voice if also visually distracted (when therapist adjusted lines/leads/moved IV pole, etc).     General Comments  HR into 80s. MAP 69    Exercises     Shoulder Instructions      Home Living Family/patient expects to be discharged to:: Private residence Living Arrangements: Alone Available Help at  Discharge: Family;Available PRN/intermittently (sister in law) Type of Home: House Home Access: Stairs to enter [employer] of Steps: 1 (if entering from garage)   Home Layout: Two level;Other (Comment) (basement down stairs with apartment. Pt reports he does not have to go down there but he likes to)     Bathroom Shower/Tub: Tub/shower unit;Walk-in shower (uses walk in)   [employer]: Standard     Home Equipment: Gilmer Mor - single point;Shower seat - built in          Prior Functioning/Environment Prior Level of Function : Independent/Modified Independent;Driving;Working/employed             Mobility Comments: no AD ADLs Comments: Cleans transmissions and mows lawn at [employer]        OT Problem List: Decreased strength;Decreased activity tolerance;Impaired balance (sitting and/or standing);Decreased cognition;Decreased safety awareness;Decreased knowledge of use of DME or AE      OT Treatment/Interventions: Self-care/ADL training;Therapeutic exercise;DME and/or AE instruction;Balance training;Patient/family education;Therapeutic activities;Cognitive remediation/compensation    OT Goals(Current goals can be found in the care plan section) Acute Rehab OT Goals Patient Stated Goal: get better and back to work/home life OT Goal Formulation: With patient Time For Goal Achievement: 07/26/23 Potential to Achieve Goals: Good  OT Frequency: Min 2X/week    Co-evaluation              AM-PAC OT "6 Clicks" Daily Activity     Outcome Measure Help from another person eating meals?: A Little Help from another person taking care of personal grooming?: A Little Help from another person toileting, which includes using toliet, bedpan, or urinal?: A Little Help  from another person bathing (including washing, rinsing, drying)?: A Little Help from another person to put on and taking off regular upper body clothing?: A Little Help from another person to put on and  taking off regular lower body clothing?: A Little 6 Click Score: 18   End of Session Equipment Utilized During Treatment: Gait belt Nurse Communication: Mobility status  Activity Tolerance: Patient tolerated treatment well Patient left: in chair;with call bell/phone within reach;with chair alarm set  OT Visit Diagnosis: Unsteadiness on feet (R26.81);Muscle weakness (generalized) (M62.81);Other symptoms and signs involving cognitive function;History of falling (Z91.81)                Time: 5956-3875 OT Time Calculation (min): 35 min Charges:  OT General Charges $OT Visit: 1 Visit OT Evaluation $OT Eval Moderate Complexity: 1 Mod OT Treatments $Self Care/Home Management : 8-22 mins  Tyler Deis, OTR/L [facility] Acute Rehabilitation Office: [phone]   Myrla Halsted 07/12/2023, 2:10 PM

## 2023-07-13 DIAGNOSIS — K922 Gastrointestinal hemorrhage, unspecified: Secondary | ICD-10-CM | POA: Diagnosis not present

## 2023-07-13 LAB — GLUCOSE, CAPILLARY
Glucose-Capillary: 100 mg/dL — ABNORMAL HIGH (ref 70–99)
Glucose-Capillary: 103 mg/dL — ABNORMAL HIGH (ref 70–99)
Glucose-Capillary: 109 mg/dL — ABNORMAL HIGH (ref 70–99)
Glucose-Capillary: 121 mg/dL — ABNORMAL HIGH (ref 70–99)
Glucose-Capillary: 80 mg/dL (ref 70–99)
Glucose-Capillary: 97 mg/dL (ref 70–99)

## 2023-07-13 LAB — TROPONIN I (HIGH SENSITIVITY)
Troponin I (High Sensitivity): 137 ng/L (ref <18)
Troponin I (High Sensitivity): 125 ng/L (ref <18)

## 2023-07-13 MED ORDER — ENOXAPARIN SODIUM 60 MG/0.6ML IJ SOSY
50.0000 mg | PREFILLED_SYRINGE | INTRAMUSCULAR | Status: DC
  Administered 2023-07-13 – 2023-07-14 (×2): 50 mg via SUBCUTANEOUS
  Filled 2023-07-13 (×2): qty 0.6

## 2023-07-13 MED ORDER — EMPAGLIFLOZIN 10 MG PO TABS
10.0000 mg | ORAL_TABLET | Freq: Every day | ORAL | Status: DC
  Administered 2023-07-13 – 2023-07-16 (×4): 10 mg via ORAL
  Filled 2023-07-13 (×4): qty 1

## 2023-07-13 MED ORDER — ASPIRIN 81 MG PO TBEC
81.0000 mg | DELAYED_RELEASE_TABLET | Freq: Every day | ORAL | Status: DC
  Administered 2023-07-13 – 2023-07-16 (×4): 81 mg via ORAL
  Filled 2023-07-13 (×4): qty 1

## 2023-07-13 MED ORDER — WARFARIN SODIUM 4 MG PO TABS
4.0000 mg | ORAL_TABLET | Freq: Once | ORAL | Status: AC
  Administered 2023-07-13: 4 mg via ORAL
  Filled 2023-07-13: qty 1

## 2023-07-13 NOTE — TOC Progression Note (Signed)
Transition of Care [facility]) - Progression Note    Patient Details  Name: Jesus [name] MRN: [id number] Date of Birth: 07/02/1941  [facility]) CM/SW Contact  Erin Sons, [place] Phone Number: 07/13/2023, 11:29 AM  Clinical Narrative:     CSW initiated SNF auth request in online portal. [phone] SNF auth is pending  Expected Discharge Plan: Home w Home Health Services Barriers to Discharge: Continued Medical Work up  Expected Discharge Plan and Services                                               Social Determinants of Health (SDOH) Interventions SDOH Screenings   Food Insecurity: No Food Insecurity (07/08/2023)  Housing: Low Risk  (07/08/2023)  Transportation Needs: No Transportation Needs (07/08/2023)  Utilities: Not At Risk (07/08/2023)  Alcohol Screen: Low Risk  (07/28/2022)  Depression (PHQ2-9): Low Risk  (09/07/2022)  Financial Resource Strain: Low Risk  (01/24/2023)  Physical Activity: Sufficiently Active (07/28/2022)  Social Connections: Moderately Integrated (07/28/2022)  Stress: No Stress Concern Present (07/28/2022)  Tobacco Use: Low Risk  (07/11/2023)    Readmission Risk Interventions    07/12/2023    9:03 AM 03/04/2022    3:49 PM  Readmission Risk Prevention Plan  Post Dischage Appt Complete   Medication Screening Complete   Transportation Screening Complete Complete  Medication Review (RN Care Manager)  Complete  PCP or Specialist appointment within 3-5 days of discharge  Complete  HRI or Home Care Consult  Complete  SW Recovery Care/Counseling Consult  Complete  Palliative Care Screening  Not Applicable  Skilled Nursing Facility  Complete

## 2023-07-13 NOTE — Progress Notes (Signed)
Pharmacy Note - Anticoagulation  See previous note from day shift pharmacy for full details. Briefly, patient on chronic warfarin for afib, held on admit for GIB. Warfarin resumed 8/7 after being cleared by GI. INR subtherapeutic today at 1.4, pharmacy consulted for Lovenox bridge.  INR 1.4 Hgb 12.2 Plts 87 (MD aware) SCr 1.39 CrCl 29 ml/min  Plan: - Lovenox 1mg /kg SQ q24h with CrCl < 30 ml/min (conservative dosing with recent GIB) - Warfarin as previously ordered - Follow up CBC, renal function, signs/symptoms of bleeding  Loralee Pacas, PharmD, BCPS 07/13/2023 5:51 PM  Please check AMION for all [facility] Pharmacy phone numbers After 10:00 PM, call Main Pharmacy [phone]

## 2023-07-13 NOTE — Progress Notes (Signed)
Pharmacy Consult for heparin gtt  Indication: atrial fibrillation  Allergies  Allergen Reactions   Amlodipine Other (See Comments)    Swelling?   Codeine Other (See Comments)    Pt does not remember   Iodine Swelling    Patient Measurements: Height: 5\' 1"  (154.9 cm) Weight: 55 kg (121 lb 4.1 oz) IBW/kg (Calculated) : 52.3   Vital Signs: Temp: 98.1 F (36.7 C) (08/08 0750) Temp Source: Oral (08/08 0015) BP: 123/48 (08/08 0750) Pulse Rate: 65 (08/08 0750)  Labs: Recent Labs    07/10/23 2358 07/11/23 0832 07/12/23 0406 07/12/23 0830 07/13/23 0014  HGB 14.7  --  12.6*  --  12.2*  HCT 44.8  --  38.0*  --  37.6*  PLT 152  --  91*  --  87*  LABPROT  --  23.4* 21.7*  --  17.4*  INR  --  2.1* 1.9*  --  1.4*  CREATININE  --   --   --  1.68* 1.39*    Estimated Creatinine Clearance: 29.3 mL/min (A) (by C-G formula based on SCr of 1.39 mg/dL (H)).  Assessment: EFRAN BLAKER a 82 y.o. male presented with concern for GIB and found to have bleeding from distant rectal polyp. S/P colonoscopy 8/6 and cleared by GI to resume warfarin. Patient was taking warfarin outpatient for h/o PAF- LD 8/1, CBC OK, INR 1.9 > 1.4 on 8/8. Plan to re-initiate home regimen conservatively with recent bleeding.   Anticoagulation PTA: warfarin 4 mg daily, except for 2 mg Mon/Fri   Goal of Therapy:  INR 2-3 Monitor platelets by anticoagulation protocol: Yes   Plan:  Warfarin 4 mg PO x1 per PTA regimen  INR/CBC daily  Monitor for signs and symptoms of bleeding   Rennis Petty, PharmD 07/13/2023 9:58 AM

## 2023-07-13 NOTE — Progress Notes (Addendum)
PROGRESS NOTE TIRAS THREATS  ZOX:[id number] DOB: [birth year] DOA: 07/07/2023 PCP: Kristian Covey, MD  Brief Narrative/Hospital Course:  82 y.o. male with medical history significant of PAF on coumadin and amiodarone, remote h/o TAVR, CAD s/p CABG 1997.  Here with bright red blood per rectum, started several days PTA.GI was consulted and patient was admitted for further management Underwent colonoscopy 8/6-1 polyp in the distal rectum removed with cold forcep diverticulosis in the sigmoid colon, descending colon, internal hemorrhoid noted.  Coumadin was resumed 8/7 Patient appeared to have deconditioning, also had episode of fever workup unremarkable for infection.  PT OT consulted advised skilled nursing facility  Subjective: Patient seen and examined this morning resting comfortably no new complaints alert awake, somewhat forgetful weak and deconditioned Overnight patient has been afebrile  BP stable on room air Was having cough last night  Assessment and Plan: Principal Problem:   Lower GI bleed Active Problems:   S/P CABG 1997   Coronary artery disease involving coronary bypass graft of native heart without angina pectoris   S/P angioplasty with stent 02/17/22 DES to proximal VG to PDA   Chronic systolic CHF (congestive heart failure) (HCC)   Paroxysmal atrial fibrillation (HCC)   Essential hypertension   Coagulopathy (HCC)   BRBPR (bright red blood per rectum)   Rectal polyp   BRBPR: In the setting of anticoagulation on Coumadin INR 2.3 6/2.Was on plavix previously, this was switched to ASA earlier last month due to being > 1 year out from DES. Seen by GI s/p Colonoscopy 07/11/23:1 polyp in the distal rectum removed with cold forcep diverticulosis in the sigmoid colon, descending colon, internal hemorrhoid noted.  Coumadin resumed 8/7 per GI recommendation , hemoglobin remains stable, continue diet.   Recent Labs  Lab 07/09/23 0427 07/10/23 0422 07/10/23 2358 07/12/23 0406  07/13/23 0014  HGB 13.0 13.3 14.7 12.6* 12.2*  HCT 40.2 40.8 44.8 38.0* 37.6*    Febrile episode x 1 8/7 0025 am: Cxr/UA ordered and unremarkable, blood cultures so far negative.  No recurrence of fever holding off on antibiotics question atelectasis related.  GI feels less likely GI issues, cont incentive spirometry, ambulate.  ?Fall Deconditioning/debility: Found on the floor 8/6, without obvious injury or complaints: Remains weak deconditioned.  Continue PT OT, awaiting for skilled nursing facility.   CT head no acute finding.   AKI: creatinine was uptrending -start IV fluids and creatinine now improving, wean off IV fluids tonight, encourage oral intake. Cont to  hold his ARB avoid nephrotoxic medication and monitor Recent Labs    09/13/22 1444 10/19/22 1349 11/04/22 1045 06/05/23 0928 07/07/23 1738 07/08/23 0445 07/09/23 0427 07/12/23 0830 07/13/23 0014  BUN 14 18 18 21 18 18 17 20  25*  CREATININE 1.09 1.19 1.07 1.14 1.27* 1.17 1.23 1.68* 1.39*  CO2 22 24 29 24 26 25 22 25 23     CAD: Chronic systolic CHF ICM with baseline EF of 35%: Volume status overall stable. holding ARB, Not on any diuretics.  Continue Coumadin and resumed aspirin, Zetia  Net IO Since Admission: -339.54 mL [07/13/23 1116]   History of TAVR for severe AS: Stable.  PAF: Rate controlled,in NSR. Coumadin resumed 8/7-pharmacy dosing cont home amiodarone Recent Labs  Lab 07/09/23 1111 07/10/23 0838 07/11/23 0832 07/12/23 0406 07/13/23 0014  INR 2.0* 2.0* 2.1* 1.9* 1.4*     Essential hypertension BP well-controlled.Due to AKI holding irbesartan   Thrombocytopenia: in the setting of acute illness no obvious bleeding, trend platelet count  ?  ST changes in Tele > EKG obtained - LBBB, Sinus bradycardia- was present [facility]. Checked troponing at no delta although some up- 137>125, ?demand ischemia with ICM.Systolic chf. He denied having chy chest pain. Discussed w/ Dr Cristal Deer- since no delta  and no chest pain- not further intervention advised. Will do lovenox bridge while inr  subtherapeutic  Of note: He had NSTEMI w/ trops 700>1301 in 02/3022, was admitted in hospital- treated for NSVT- he had cardiac cath 02/17/22> " Severe native vessel CAD s/p 6V CABG with 6/6 patent grafts. 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 The LIMA to the mid LAD is patent and fills the mid and distal LAD and a large diagonal branch The sequential vein graft to OM1, OM2 and OM3 is patent. The sequential vein graft to the RV marginal branch and the right PDA is patent. There is a severe stenosis in the proximal body of the SVG to the PDA> successful PTCA/DES x 1 proximal body of SVG to PDA using a distal protection device" and manitained on warfarin/asa plavix until therapeutic INR.    DVT prophylaxis: SCDs Start: 07/07/23 2213 Code Status:   Code Status: DNR Family Communication: plan of care discussed with patient at bedside. Patient status is:  inpatient due to brbpr and deconditioning Level of care: Telemetry Medical   Dispo: The patient is from: home            Anticipated disposition: SNF once available  Objective: Vitals last 24 hrs: Vitals:   07/12/23 1958 07/13/23 0015 07/13/23 0526 07/13/23 0750  BP: 102/81 (!) 129/52 (!) 126/57 (!) 123/48  Pulse: 71 66 66 65  Resp: 16 16 16 14   Temp: 99 F (37.2 C) 99.3 F (37.4 C) 98.1 F (36.7 C) 98.1 F (36.7 C)  TempSrc: Oral Oral    SpO2: 99% 96% 97% 96%  Weight:      Height:       Weight change:   Physical Examination:  General exam: alert awake, oriented at baseline, older than stated age HEENT:Oral mucosa moist, Ear/Nose WNL grossly Respiratory system: Bilaterally clear BS,no use of accessory muscle Cardiovascular system: S1 & S2 +, No JVD. Gastrointestinal system: Abdomen soft,NT,ND, BS+ Nervous System: Alert, awake, moving all extremities,and following commands. Extremities: LE edema neg,distal peripheral  pulses palpable and warm.  Skin: No rashes,no icterus. MSK: Normal muscle bulk,tone, power   Medications reviewed:  Scheduled Meds:  amiodarone  100 mg Oral Daily   aspirin EC  81 mg Oral Daily   atorvastatin  80 mg Oral Daily   empagliflozin  10 mg Oral Daily   ezetimibe  10 mg Oral Daily   guaiFENesin  600 mg Oral BID   insulin aspart  0-9 Units Subcutaneous Q4H   multivitamin with minerals  1 tablet Oral Daily   pantoprazole (PROTONIX) IV  40 mg Intravenous Q24H   warfarin  4 mg Oral ONCE-1600   Warfarin - Pharmacist Dosing Inpatient   Does not apply q1600  Continuous Infusions:  sodium chloride 75 mL/hr at 07/12/23 1134    Diet Order             Diet Heart Room service appropriate? Yes; Fluid consistency: Thin  Diet effective now                   Intake/Output Summary (Last 24 hours) at 07/13/2023 1116 Last data filed at 07/13/2023 0944 Gross per 24 hour  Intake --  Output 700 ml  Net -700 ml    Net IO Since Admission: -339.54 mL [07/13/23 1116]  Wt Readings from Last 3 Encounters:  07/11/23 55 kg  07/04/23 54.9 kg  06/15/23 55.2 kg     Unresulted Labs (From admission, onward)     Start     Ordered   07/13/23 0500  Basic metabolic panel  Daily,   R     Question:  Specimen collection method  Answer:  Lab=Lab collect   07/12/23 1154   07/13/23 0500  CBC  Daily,   R     Question:  Specimen collection method  Answer:  Lab=Lab collect   07/12/23 1154   07/12/23 0500  Protime-INR  Daily,   R     Question:  Specimen collection method  Answer:  Lab=Lab collect   07/11/23 0750          Data Reviewed: I have personally reviewed following labs and imaging studies CBC: Recent Labs  Lab 07/09/23 0427 07/10/23 0422 07/10/23 2358 07/12/23 0406 07/13/23 0014  WBC 6.9 7.2 11.3* 5.8 4.7  NEUTROABS 4.7  --   --   --   --   HGB 13.0 13.3 14.7 12.6* 12.2*  HCT 40.2 40.8 44.8 38.0* 37.6*  MCV 101.0* 99.3 100.2* 102.7* 100.5*  PLT 129* 137* 152 91* 87*    Basic Metabolic Panel: Recent Labs  Lab 07/07/23 1738 07/08/23 0445 07/08/23 1009 07/09/23 0427 07/12/23 0830 07/13/23 0014  NA 140 137  --  137 137 135  K 4.5 4.1  --  4.0 4.2 4.0  CL 104 106  --  104 100 104  CO2 26 25  --  22 25 23   GLUCOSE 112* 93  --  79 97 112*  BUN 18 18  --  17 20 25*  CREATININE 1.27* 1.17  --  1.23 1.68* 1.39*  CALCIUM 8.9 8.5*  --  8.5* 8.5* 7.8*  MG  --   --  2.3 2.1  --   --   PHOS  --   --   --  4.1  --   --   Estimated Creatinine Clearance: 29.3 mL/min (A) (by C-G formula based on SCr of 1.39 mg/dL (H)). Recent Labs  Lab 07/07/23 1738 07/09/23 0427  AST 33 30  ALT 30 28  ALKPHOS 119 92  BILITOT 1.0 1.7*  PROT 6.7 6.0*  ALBUMIN 3.8 3.2*   Recent Labs  Lab 07/09/23 1111 07/10/23 0838 07/11/23 0832 07/12/23 0406 07/13/23 0014  INR 2.0* 2.0* 2.1* 1.9* 1.4*   No results for input(s): "HGBA1C" in the last 72 hours. CBG: Recent Labs  Lab 07/12/23 1800 07/12/23 2049 07/13/23 0015 07/13/23 0517 07/13/23 0830  GLUCAP 119* 105* 109* 80 97   Recent Results (from the past 240 hour(s))  Culture, blood (Routine X 2) w Reflex to ID Panel     Status: None (Preliminary result)   Collection Time: 07/12/23  4:05 AM   Specimen: BLOOD RIGHT ARM  Result Value Ref Range Status   Specimen Description BLOOD RIGHT ARM  Final   Special Requests   Final    BOTTLES DRAWN AEROBIC AND ANAEROBIC Blood Culture adequate volume   Culture   Final    NO GROWTH 1 DAY Performed at [facility], [address] [place], [place] [address]    Report Status PENDING  Incomplete  Culture, blood (Routine X 2) w Reflex to ID Panel     Status: None (Preliminary result)   Collection  Time: 07/12/23  4:08 AM   Specimen: BLOOD RIGHT HAND  Result Value Ref Range Status   Specimen Description BLOOD RIGHT HAND  Final   Special Requests   Final    BOTTLES DRAWN AEROBIC AND ANAEROBIC Blood Culture adequate volume   Culture   Final    NO GROWTH 1 DAY Performed  at [facility], [address] [place], [place] [address]    Report Status PENDING  Incomplete    Antimicrobials: Anti-infectives (From admission, onward)    None     Culture/Microbiology    Component Value Date/Time   SDES BLOOD RIGHT HAND 07/12/2023 0408   SPECREQUEST  07/12/2023 0408    BOTTLES DRAWN AEROBIC AND ANAEROBIC Blood Culture adequate volume   CULT  07/12/2023 0408    NO GROWTH 1 DAY Performed at [facility], [address] [place], [place] [address]    REPTSTATUS PENDING 07/12/2023 0408  Other culture-see note Radiology Studies: CT HEAD WO CONTRAST ( )  Result Date: 07/12/2023 CLINICAL DATA:  Headache EXAM: CT HEAD WITHOUT CONTRAST TECHNIQUE: Contiguous axial images were obtained from the base of the skull through the vertex without intravenous contrast. RADIATION DOSE REDUCTION: This exam was performed according to the departmental dose-optimization program which includes automated exposure control, adjustment of the mA and/or kV according to patient size and/or use of iterative reconstruction technique. COMPARISON:  Brain MRI 09/21/2007 FINDINGS: Brain: There is no acute intracranial hemorrhage, extra-axial fluid collection, or acute territorial infarct. Background parenchymal volume is normal for age. The ventricles are normal in size. Small remote appearing infarcts are noted in the left basal ganglia. No cortical encephalomalacia is seen. There is no mass lesion. The pituitary and suprasellar region are normal. There is no mass effect or midline shift. Vascular: There is calcification of the bilateral carotid siphons and vertebral arteries. Skull: Normal. Negative for fracture or focal lesion. Sinuses/Orbits: There is mild mucosal thickening in the paranasal sinuses. Bilateral lens implants are in place. The globes and orbits are otherwise unremarkable. Other: The mastoid air cells and middle ear cavities are clear. IMPRESSION: 1. No acute intracranial  pathology. 2. 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 Electronically Signed   By: Lesia Hausen M.D.   On: 07/12/2023 10:41   DG CHEST PORT 1 VIEW  Result Date: 07/12/2023 CLINICAL DATA:  Fever EXAM: PORTABLE CHEST 1 VIEW COMPARISON:  10/25/2022 FINDINGS: Claudie Fisherman overlies the lung apices. Chronic interstitial thickening again noted. No superimposed confluent pulmonary infiltrate. No definite pneumothorax. No pleural effusion. Coronary artery bypass grafting and transcatheter aortic valve replacement have been performed. Cardiac size is within normal limits. No acute bone abnormality. IMPRESSION: 1. No radiographic evidence of acute cardiopulmonary disease. Electronically Signed   By: Helyn Numbers M.D.   On: 07/12/2023 02:30     LOS: 2 days   Lanae Boast, MD [facility]  07/13/2023, 11:16 AM

## 2023-07-13 NOTE — TOC Progression Note (Signed)
Transition of Care [facility]) - Progression Note    Patient Details  Name: Jesus [name] MRN: [id number] Date of Birth: 04/01/1941  [facility] CM/SW Contact  Janae Bridgeman, RN Phone Number: 07/13/2023, 10:40 AM  Clinical Narrative:    CM met with the patient at the bedside and the patient accepted the bed offer for [facility].  I called the patient's niece, Synetta Fail and updated her and asked that family provide transportation to the facility by car once insurance authorization is approved.  Rosemary Holms, CM at [facility] is aware that insurance authorization will be started by MSW.  [place] MSW is aware and is starting English as a second language teacher.   Expected Discharge Plan: Home w Home Health Services Barriers to Discharge: Continued Medical Work up  Expected Discharge Plan and Services                                               Social Determinants of Health (SDOH) Interventions SDOH Screenings   Food Insecurity: No Food Insecurity (07/08/2023)  Housing: Low Risk  (07/08/2023)  Transportation Needs: No Transportation Needs (07/08/2023)  Utilities: Not At Risk (07/08/2023)  Alcohol Screen: Low Risk  (07/28/2022)  Depression (PHQ2-9): Low Risk  (09/07/2022)  Financial Resource Strain: Low Risk  (01/24/2023)  Physical Activity: Sufficiently Active (07/28/2022)  Social Connections: Moderately Integrated (07/28/2022)  Stress: No Stress Concern Present (07/28/2022)  Tobacco Use: Low Risk  (07/11/2023)    Readmission Risk Interventions    07/12/2023    9:03 AM 03/04/2022    3:49 PM  Readmission Risk Prevention Plan  Post Dischage Appt Complete   Medication Screening Complete   Transportation Screening Complete Complete  Medication Review (RN Care Manager)  Complete  PCP or Specialist appointment within 3-5 days of discharge  Complete  HRI or Home Care Consult  Complete  SW Recovery Care/Counseling Consult  Complete  Palliative Care Screening  Not  Applicable  Skilled Nursing Facility  Complete

## 2023-07-13 NOTE — Progress Notes (Signed)
Received critical lab results for TROPONIN , 137 at [address] - lab tech).  Made  the provider aware

## 2023-07-13 NOTE — Progress Notes (Signed)
Physical Therapy Treatment Patient Details Name: Jesus [name] MRN: [id number] DOB: 03-Apr-1941 Today's Date: 07/13/2023   History of Present Illness 82 y.o. male adm 07/07/23 BRBPR, 8/6 colonoscopy. 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 Difficult to understand true PLOF and pt consistently changing his response. Pt with increased gait tolerance this session and improving balance with encouragement to continue to get up to toilet during the day and OOB for meal. Will continue to follow.      If plan is discharge home, recommend the following: A little help with walking and/or transfers;A little help with bathing/dressing/bathroom;Assistance with cooking/housework   Can travel by private vehicle     Yes  Equipment Recommendations  Rolling walker (2 wheels)    Recommendations for Other Services       Precautions / Restrictions Precautions Precautions: Fall     Mobility  Bed Mobility Overal bed mobility: Modified Independent Bed Mobility: Supine to Sit           General bed mobility comments: bed flat with reliance on rail to rise, increased time    Transfers Overall transfer level: Needs assistance   Transfers: Sit to/from Stand Sit to Stand: Supervision           General transfer comment: pt able to stand from EOB and low toilet with rail    Ambulation/Gait Ambulation/Gait assistance: Contact guard assist Gait Distance (Feet): 250 Feet Assistive device: Rolling walker (2 wheels) Gait Pattern/deviations: Step-through pattern, Decreased stride length, Trunk flexed   Gait velocity interpretation: 1.31 - 2.62 ft/sec, indicative of limited community ambulator   General Gait Details: kyphotic posture with cues for RW control and proximity, cues for direction.  pt able to self-direct distance   Stairs             Wheelchair Mobility     Tilt Bed    Modified Rankin (Stroke Patients Only)       Balance Overall balance assessment: Needs assistance   Sitting balance-Leahy Scale: Good Sitting balance - Comments: EOb without support   Standing balance support: Single extremity supported, During functional activity, No upper extremity supported Standing balance-Leahy Scale: Fair Standing balance comment: static standing without assist to wash hands. RW for gait                            Cognition Arousal: Alert Behavior During Therapy: WFL for tasks assessed/performed Overall Cognitive Status: Difficult to assess Area of Impairment: Memory                     Memory: Decreased short-term memory Following Commands: Follows one step commands consistently     Problem Solving: Slow processing General Comments: pt oriented, pleasant and following commands but providing conflicting information when asked about PLOF. 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 and doesnt' use an AD then later states he uses a Systems developer Comments        Pertinent Vitals/Pain Pain Assessment Pain Assessment: No/denies pain    Home Living  Prior Function            PT Goals (current goals can now be found in the care plan section) Progress towards PT goals: Progressing toward goals    Frequency    Min 1X/week      PT Plan      Co-evaluation              AM-PAC PT "6 Clicks" Mobility   Outcome Measure  Help needed turning from your back to your side while in a flat bed without using bedrails?: None Help needed moving from lying on your back to sitting on the side of a flat bed without using bedrails?: None Help needed moving to and from a bed to a chair (including a wheelchair)?: A Little Help needed standing up from a chair using your arms  (e.g., wheelchair or bedside chair)?: A Little Help needed to walk in hospital room?: A Little Help needed climbing 3-5 steps with a railing? : A Lot 6 Click Score: 19    End of Session   Activity Tolerance: Patient tolerated treatment well Patient left: in chair;with call bell/phone within reach;with chair alarm set Nurse Communication: Mobility status PT Visit Diagnosis: Muscle weakness (generalized) (M62.81);Unsteadiness on feet (R26.81)     Time: 8119-1478 PT Time Calculation (min) (ACUTE ONLY): 28 min  Charges:    $Gait Training: 8-22 mins $Therapeutic Activity: 8-22 mins PT General Charges $$ ACUTE PT VISIT: 1 Visit                     Merryl Hacker, PT Acute Rehabilitation Services Office: [phone]    [name]  07/13/2023, 2:08 PM

## 2023-07-14 ENCOUNTER — Encounter (HOSPITAL_COMMUNITY): Payer: Self-pay | Admitting: Internal Medicine

## 2023-07-14 ENCOUNTER — Other Ambulatory Visit (HOSPITAL_COMMUNITY): Payer: Medicare Other

## 2023-07-14 DIAGNOSIS — K922 Gastrointestinal hemorrhage, unspecified: Secondary | ICD-10-CM | POA: Diagnosis not present

## 2023-07-14 LAB — GLUCOSE, CAPILLARY
Glucose-Capillary: 103 mg/dL — ABNORMAL HIGH (ref 70–99)
Glucose-Capillary: 122 mg/dL — ABNORMAL HIGH (ref 70–99)
Glucose-Capillary: 128 mg/dL — ABNORMAL HIGH (ref 70–99)
Glucose-Capillary: 77 mg/dL (ref 70–99)
Glucose-Capillary: 93 mg/dL (ref 70–99)
Glucose-Capillary: 97 mg/dL (ref 70–99)

## 2023-07-14 MED ORDER — WARFARIN SODIUM 4 MG PO TABS
4.0000 mg | ORAL_TABLET | Freq: Once | ORAL | Status: AC
  Administered 2023-07-14: 4 mg via ORAL
  Filled 2023-07-14: qty 1

## 2023-07-14 NOTE — TOC Progression Note (Signed)
Transition of Care [facility]) - Progression Note    Patient Details  Name: ELIZA DOBROWOLSKI MRN: [id number] Date of Birth: 09-29-1941  [facility] CM/SW Contact  Erin Sons, [place] Phone Number: 07/14/2023, 4:16 PM  Clinical Narrative:      CSW received voicemail regarding pt's auth. Peer to peer is being offered. Provider would need to call [phone] and select option 5. Name, dob, and member GN([phone]) will be needed at time of call. Deadline is Monday 8/12 at 11am.   Expected Discharge Plan: Home w Home Health Services Barriers to Discharge: Continued Medical Work up  Expected Discharge Plan and Services                                               Social Determinants of Health (SDOH) Interventions SDOH Screenings   Food Insecurity: No Food Insecurity (07/08/2023)  Housing: Low Risk  (07/08/2023)  Transportation Needs: No Transportation Needs (07/08/2023)  Utilities: Not At Risk (07/08/2023)  Alcohol Screen: Low Risk  (07/28/2022)  Depression (PHQ2-9): Low Risk  (09/07/2022)  Financial Resource Strain: Low Risk  (01/24/2023)  Physical Activity: Sufficiently Active (07/28/2022)  Social Connections: Moderately Integrated (07/28/2022)  Stress: No Stress Concern Present (07/28/2022)  Tobacco Use: Low Risk  (07/11/2023)    Readmission Risk Interventions    07/12/2023    9:03 AM 03/04/2022    3:49 PM  Readmission Risk Prevention Plan  Post Dischage Appt Complete   Medication Screening Complete   Transportation Screening Complete Complete  Medication Review (RN Care Manager)  Complete  PCP or Specialist appointment within 3-5 days of discharge  Complete  HRI or Home Care Consult  Complete  SW Recovery Care/Counseling Consult  Complete  Palliative Care Screening  Not Applicable  Skilled Nursing Facility  Complete

## 2023-07-14 NOTE — Progress Notes (Addendum)
Pharmacy Consult for warfarin and Lovenox Indication: atrial fibrillation  Allergies  Allergen Reactions   Amlodipine Other (See Comments)    Swelling?   Codeine Other (See Comments)    Pt does not remember   Iodine Swelling    Patient Measurements: Height: 5\' 1"  (154.9 cm) Weight: 53.7 kg (118 lb 6.2 oz) IBW/kg (Calculated) : 52.3   Vital Signs: Temp: 98 F (36.7 C) (08/09 0012) Temp Source: Oral (08/09 0012) BP: 124/53 (08/09 0012) Pulse Rate: 52 (08/09 0012)  Labs: Recent Labs    07/12/23 0406 07/12/23 0830 07/13/23 0014 07/14/23 0322  HGB 12.6*  --  12.2* 12.9*  HCT 38.0*  --  37.6* 38.4*  PLT 91*  --  87* 91*  LABPROT 21.7*  --  17.4* 16.5*  INR 1.9*  --  1.4* 1.3*  CREATININE  --  1.68* 1.39* 1.46*    Estimated Creatinine Clearance: 27.9 mL/min (A) (by C-G formula based on SCr of 1.46 mg/dL (H)).  Assessment: Jesus [name] a 82 y.o. male presented with concern for GIB and found to have bleeding from distant rectal polyp. S/P colonoscopy 8/6 and cleared by GI to resume warfarin. Patient was taking warfarin outpatient for h/o PAF- LD 8/1, INR therapeutic on admission at 2.3. Pharmacy consulted to re-initiate warfarin and dose lovenox to bridge until INR is therapeutic.  Anticoagulation PTA: Therapeutic INR on Warfarin 4 mg daily, except for 2 mg Mon/Fri   Today Hgb 12.9 - stable, INR 1.3 after 4mg  x2. Typical dose for today would be 2mg  based on PTA regimen.  Goal of Therapy:  INR 2-3 Monitor platelets by anticoagulation protocol: Yes   Plan:  Warfarin 4 mg PO x1 today, slight increase from PTA regimen to achieve therapeutic INR Continue lovenox 50mg  Q24h Monitor INR/CBC and symptoms of bleeding   Rennis Petty, PharmD 07/14/2023 7:52 AM

## 2023-07-14 NOTE — Plan of Care (Signed)

## 2023-07-14 NOTE — Progress Notes (Signed)
PROGRESS NOTE Jesus [name]  ZOX:[id number] DOB: 09/22/1941 DOA: 07/07/2023 PCP: Kristian Covey, MD  Brief Narrative/Hospital Course:  82 y.o. male with medical history significant of PAF on coumadin and amiodarone, remote h/o TAVR, CAD s/p CABG 1997.  Here with bright red blood per rectum, started several days PTA.GI was consulted and patient was admitted for further management Underwent colonoscopy 8/6-1 polyp in the distal rectum removed with cold forcep diverticulosis in the sigmoid colon, descending colon, internal hemorrhoid noted.  Coumadin was resumed 8/7 Patient appeared to have deconditioning, also had episode of fever workup unremarkable for infection.  PT OT consulted advised skilled nursing facility.  He had AKI given IV fluids and creatinine trending down.  At this time is stable awaiting for placement  Subjective: Patient seen and examined this morning Resting comfortably in the bedside chair Denies chest pain nausea vomiting Overnight afebrile, platelet slightly trending up 91 hemoglobin stable 12.9 creatinine at 1.4  Assessment and Plan: Principal Problem:   Lower GI bleed Active Problems:   S/P CABG 1997   Coronary artery disease involving coronary bypass graft of native heart without angina pectoris   S/P angioplasty with stent 02/17/22 DES to proximal VG to PDA   Chronic systolic CHF (congestive heart failure) (HCC)   Paroxysmal atrial fibrillation (HCC)   Essential hypertension   Coagulopathy (HCC)   BRBPR (bright red blood per rectum)   Rectal polyp   BRBPR: In the setting of anticoagulation on Coumadin INR 2.3 6/2.Was on plavix previously, this was switched to ASA earlier last month due to being > 1 year out from DES. Seen by GI s/p Colonoscopy 07/11/23:1 polyp in the distal rectum removed with cold forcep diverticulosis in the sigmoid colon, descending colon, internal hemorrhoid noted.  Coumadin resumed 8/7 per GI recommendation , hemoglobin remains stable as  below and 12 g range, tolerating diet. Recent Labs  Lab 07/10/23 0422 07/10/23 2358 07/12/23 0406 07/13/23 0014 07/14/23 0322  HGB 13.3 14.7 12.6* 12.2* 12.9*  HCT 40.8 44.8 38.0* 37.6* 38.4*    Febrile episode x 1 8/7 0025 am: Cxr/UA ordered and unremarkable.No recurrence of fever, and blood culture no growth so far holding off on antibiotics.  Encourage IS.  ?Fall Deconditioning/debility: Found on the floor 8/6, without obvious injury or complaints: Remains weak deconditioned.  Continue PT OT, awaiting for skilled nursing facility. CT head no acute finding.   AKI: creatinine was uptrending -started IV fluids and creatinine now stable at 1.4 as below-patient is taking p.o. well avoid IV fluid hydration given his low EF of 35%.Cont to  hold his ARB avoid nephrotoxic medication and monitor Recent Labs    09/13/22 1444 10/19/22 1349 11/04/22 1045 06/05/23 0928 07/07/23 1738 07/08/23 0445 07/09/23 0427 07/12/23 0830 07/13/23 0014 07/14/23 0322  BUN 14 18 18 21 18 18 17 20  25* 27*  CREATININE 1.09 1.19 1.07 1.14 1.27* 1.17 1.23 1.68* 1.39* 1.46*  CO2 22 24 29 24 26 25 22 25 23  25    CAD: Chronic systolic CHF ICM with baseline EF of 35% Elevated troponin likely demand ischemia without chest pain EKG obtained - LBBB, Sinus bradycardia- was present [facility]ore. Checked troponing at no delta although some up- 137>125, ?demand ischemia with ICM.Systolic chf. He denied having chy chest pain. Discussed w/ Dr Cristal Deer- since no delta and no chest pain- no further intervention advised. Will do lovenox bridge while inr  subtherapeutic He had NSTEMI w/ trops 700>1301 in 02/3022, was admitted in hospital- treated for  NSVT- he had cardiac cath 02/17/22> " Severe native vessel CAD s/p 6V CABG with 6/6 patent grafts. 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 The LIMA to the mid LAD is patent and fills the mid and distal LAD and a large diagonal branch The sequential vein  graft to OM1, OM2 and OM3 is patent. The sequential vein graft to the RV marginal branch and the right PDA is patent. There is a severe stenosis in the proximal body of the SVG to the PDA> successful PTCA/DES x 1 proximal body of SVG to PDA using a distal protection device" Volume status overall stable. holding ARB, Not on any diuretics.  Continue Coumadin and aspirin, Zetia  Net IO Since Admission: -339.54 mL [07/14/23 0747]   History of TAVR for severe AS: Stable.  PAF: Rate controlled,in NSR. Coumadin resumed 8/7-with Lovenox bridge since INR subtherapeutic  cont amiodarone Recent Labs  Lab 07/10/23 0838 07/11/23 0832 07/12/23 0406 07/13/23 0014 07/14/23 0322  INR 2.0* 2.1* 1.9* 1.4* 1.3*     Essential hypertension BP well-controlled.Due to AKI holding irbesartan   Thrombocytopenia: in the setting of acute illness no obvious bleeding, trend platelet count  DVT prophylaxis: SCDs Start: 07/07/23 2213 Code Status:   Code Status: DNR Family Communication: plan of care discussed with patient at bedside. Patient status is:  inpatient due to brbpr and deconditioning Level of care: Telemetry Medical   Dispo: The patient is from: home            Anticipated disposition: SNF once available  Objective: Vitals last 24 hrs: Vitals:   07/13/23 1951 07/14/23 0012 07/14/23 0654 07/14/23 0731  BP: (!) 126/48 (!) 124/53  102/72  Pulse: 61 (!) 52  (!) 58  Resp: 17 17  15   Temp: 98.5 F (36.9 C) 98 F (36.7 C)  98.2 F (36.8 C)  TempSrc: Oral Oral    SpO2: 100% 97%  96%  Weight:   53.7 kg   Height:       Weight change:   Physical Examination:  General exam: alert awake, oriented at baseline, older than stated age HEENT:Oral mucosa moist, Ear/Nose WNL grossly Respiratory system: Bilaterally clear BS,no use of accessory muscle Cardiovascular system: S1 & S2 +, No JVD. Gastrointestinal system: Abdomen soft,NT,ND, BS+ Nervous System: Alert, awake, moving all extremities,and  following commands. Extremities: LE edema neg,distal peripheral pulses palpable and warm.  Skin: No rashes,no icterus. MSK: Normal muscle bulk,tone, power   Medications reviewed:  Scheduled Meds:  amiodarone  100 mg Oral Daily   aspirin EC  81 mg Oral Daily   atorvastatin  80 mg Oral Daily   empagliflozin  10 mg Oral Daily   enoxaparin (LOVENOX) injection  50 mg Subcutaneous Q24H   ezetimibe  10 mg Oral Daily   guaiFENesin  600 mg Oral BID   insulin aspart  0-9 Units Subcutaneous Q4H   multivitamin with minerals  1 tablet Oral Daily   pantoprazole (PROTONIX) IV  40 mg Intravenous Q24H   Warfarin - Pharmacist Dosing Inpatient   Does not apply q1600  Continuous Infusions:    Diet Order             Diet Heart Room service appropriate? Yes; Fluid consistency: Thin  Diet effective now                   Intake/Output Summary (Last 24 hours) at 07/14/2023 0747 Last data filed at 07/13/2023 0944 Gross per 24 hour  Intake --  Output 200 ml  Net -200 ml    Net IO Since Admission: -339.54 mL [07/14/23 0747]  Wt Readings from Last 3 Encounters:  07/14/23 53.7 kg  07/04/23 54.9 kg  06/15/23 55.2 kg     Unresulted Labs (From admission, onward)     Start     Ordered   07/13/23 0500  Basic metabolic panel  Daily,   R     Question:  Specimen collection method  Answer:  Lab=Lab collect   07/12/23 1154   07/13/23 0500  CBC  Daily,   R     Question:  Specimen collection method  Answer:  Lab=Lab collect   07/12/23 1154   07/12/23 0500  Protime-INR  Daily,   R     Question:  Specimen collection method  Answer:  Lab=Lab collect   07/11/23 0750          Data Reviewed: I have personally reviewed following labs and imaging studies CBC: Recent Labs  Lab 07/09/23 0427 07/10/23 0422 07/10/23 2358 07/12/23 0406 07/13/23 0014 07/14/23 0322  WBC 6.9 7.2 11.3* 5.8 4.7 3.8*  NEUTROABS 4.7  --   --   --   --   --   HGB 13.0 13.3 14.7 12.6* 12.2* 12.9*  HCT 40.2 40.8 44.8 38.0*  37.6* 38.4*  MCV 101.0* 99.3 100.2* 102.7* 100.5* 102.1*  PLT 129* 137* 152 91* 87* 91*   Basic Metabolic Panel: Recent Labs  Lab 07/08/23 0445 07/08/23 1009 07/09/23 0427 07/12/23 0830 07/13/23 0014 07/14/23 0322  NA 137  --  137 137 135 137  K 4.1  --  4.0 4.2 4.0 4.2  CL 106  --  104 100 104 103  CO2 25  --  22 25 23 25   GLUCOSE 93  --  79 97 112* 98  BUN 18  --  17 20 25* 27*  CREATININE 1.17  --  1.23 1.68* 1.39* 1.46*  CALCIUM 8.5*  --  8.5* 8.5* 7.8* 8.3*  MG  --  2.3 2.1  --   --   --   PHOS  --   --  4.1  --   --   --   Estimated Creatinine Clearance: 27.9 mL/min (A) (by C-G formula based on SCr of 1.46 mg/dL (H)). Recent Labs  Lab 07/07/23 1738 07/09/23 0427  AST 33 30  ALT 30 28  ALKPHOS 119 92  BILITOT 1.0 1.7*  PROT 6.7 6.0*  ALBUMIN 3.8 3.2*   Recent Labs  Lab 07/10/23 1610 07/11/23 0832 07/12/23 0406 07/13/23 0014 07/14/23 0322  INR 2.0* 2.1* 1.9* 1.4* 1.3*   No results for input(s): "HGBA1C" in the last 72 hours. CBG: Recent Labs  Lab 07/13/23 1255 07/13/23 1654 07/13/23 1949 07/14/23 0013 07/14/23 0436  GLUCAP 103* 100* 121* 103* 93   Recent Results (from the past 240 hour(s))  Culture, blood (Routine X 2) w Reflex to ID Panel     Status: None (Preliminary result)   Collection Time: 07/12/23  4:05 AM   Specimen: BLOOD RIGHT ARM  Result Value Ref Range Status   Specimen Description BLOOD RIGHT ARM  Final   Special Requests   Final    BOTTLES DRAWN AEROBIC AND ANAEROBIC Blood Culture adequate volume   Culture   Final    NO GROWTH 1 DAY Performed at [facility], [address], [place], [place] [address]    Report Status PENDING  Incomplete  Culture, blood (Routine X  2) w Reflex to ID Panel     Status: None (Preliminary result)   Collection Time: 07/12/23  4:08 AM   Specimen: BLOOD RIGHT HAND  Result Value Ref Range Status   Specimen Description BLOOD RIGHT HAND  Final   Special Requests   Final    BOTTLES DRAWN AEROBIC  AND ANAEROBIC Blood Culture adequate volume   Culture   Final    NO GROWTH 1 DAY Performed at [facility], [address] [place], [place] [address]    Report Status PENDING  Incomplete    Antimicrobials: Anti-infectives (From admission, onward)    None     Culture/Microbiology    Component Value Date/Time   SDES BLOOD RIGHT HAND 07/12/2023 0408   SPECREQUEST  07/12/2023 0408    BOTTLES DRAWN AEROBIC AND ANAEROBIC Blood Culture adequate volume   CULT  07/12/2023 0408    NO GROWTH 1 DAY Performed at [facility], [address] [place], [place] [address]    REPTSTATUS PENDING 07/12/2023 0408  Other culture-see note Radiology Studies: CT HEAD WO CONTRAST ( )  Result Date: 07/12/2023 CLINICAL DATA:  Headache EXAM: CT HEAD WITHOUT CONTRAST TECHNIQUE: Contiguous axial images were obtained from the base of the skull through the vertex without intravenous contrast. RADIATION DOSE REDUCTION: This exam was performed according to the departmental dose-optimization program which includes automated exposure control, adjustment of the mA and/or kV according to patient size and/or use of iterative reconstruction technique. COMPARISON:  Brain MRI 09/21/2007 FINDINGS: Brain: There is no acute intracranial hemorrhage, extra-axial fluid collection, or acute territorial infarct. Background parenchymal volume is normal for age. The ventricles are normal in size. Small remote appearing infarcts are noted in the left basal ganglia. No cortical encephalomalacia is seen. There is no mass lesion. The pituitary and suprasellar region are normal. There is no mass effect or midline shift. Vascular: There is calcification of the bilateral carotid siphons and vertebral arteries. Skull: Normal. Negative for fracture or focal lesion. Sinuses/Orbits: There is mild mucosal thickening in the paranasal sinuses. Bilateral lens implants are in place. The globes and orbits are otherwise unremarkable. Other: The  mastoid air cells and middle ear cavities are clear. IMPRESSION: 1. No acute intracranial pathology. 2. 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 Electronically Signed   By: Lesia Hausen M.D.   On: 07/12/2023 10:41     LOS: 3 days   Lanae Boast, MD [facility]  07/14/2023, 7:47 AM

## 2023-07-14 NOTE — Care Management Important Message (Signed)
Important Message  Patient Details  Name: MYKAIL STOKELY MRN: [id number] Date of Birth: February 05, 1941   Medicare Important Message Given:  Yes     Dorena Bodo 07/14/2023, 3:27 PM

## 2023-07-15 DIAGNOSIS — K922 Gastrointestinal hemorrhage, unspecified: Secondary | ICD-10-CM | POA: Diagnosis not present

## 2023-07-15 LAB — GLUCOSE, CAPILLARY
Glucose-Capillary: 113 mg/dL — ABNORMAL HIGH (ref 70–99)
Glucose-Capillary: 121 mg/dL — ABNORMAL HIGH (ref 70–99)
Glucose-Capillary: 149 mg/dL — ABNORMAL HIGH (ref 70–99)
Glucose-Capillary: 93 mg/dL (ref 70–99)
Glucose-Capillary: 95 mg/dL (ref 70–99)
Glucose-Capillary: 99 mg/dL (ref 70–99)

## 2023-07-15 MED ORDER — ENOXAPARIN SODIUM 60 MG/0.6ML IJ SOSY
50.0000 mg | PREFILLED_SYRINGE | Freq: Two times a day (BID) | INTRAMUSCULAR | Status: DC
  Administered 2023-07-15 (×2): 50 mg via SUBCUTANEOUS
  Filled 2023-07-15 (×2): qty 0.6

## 2023-07-15 MED ORDER — METOPROLOL TARTRATE 12.5 MG HALF TABLET
12.5000 mg | ORAL_TABLET | Freq: Two times a day (BID) | ORAL | Status: DC
  Administered 2023-07-15 – 2023-07-16 (×2): 12.5 mg via ORAL
  Filled 2023-07-15 (×2): qty 1

## 2023-07-15 MED ORDER — GUAIFENESIN-DM 100-10 MG/5ML PO SYRP
10.0000 mL | ORAL_SOLUTION | ORAL | Status: DC | PRN
  Administered 2023-07-15: 10 mL via ORAL
  Filled 2023-07-15: qty 10

## 2023-07-15 MED ORDER — WARFARIN SODIUM 4 MG PO TABS
4.0000 mg | ORAL_TABLET | Freq: Once | ORAL | Status: AC
  Administered 2023-07-15: 4 mg via ORAL
  Filled 2023-07-15: qty 1

## 2023-07-15 MED ORDER — METOPROLOL TARTRATE 25 MG PO TABS
25.0000 mg | ORAL_TABLET | Freq: Two times a day (BID) | ORAL | Status: DC
  Administered 2023-07-15: 25 mg via ORAL
  Filled 2023-07-15: qty 1

## 2023-07-15 NOTE — Progress Notes (Signed)
Pharmacy Consult for warfarin and Lovenox Indication: atrial fibrillation  Allergies  Allergen Reactions   Amlodipine Other (See Comments)    Swelling?   Codeine Other (See Comments)    Pt does not remember   Iodine Swelling    Patient Measurements: Height: 5\' 1"  (154.9 cm) Weight: 53.7 kg (118 lb 6.2 oz) IBW/kg (Calculated) : 52.3   Vital Signs: Temp: 98.2 F (36.8 C) (08/10 0035) Temp Source: Oral (08/10 0035) BP: 113/70 (08/10 0035) Pulse Rate: 93 (08/10 0035)  Labs: Recent Labs    07/13/23 0014 07/14/23 0322 07/15/23 0428  HGB 12.2* 12.9* 13.3  HCT 37.6* 38.4* 40.2  PLT 87* 91* 97*  LABPROT 17.4* 16.5* 17.5*  INR 1.4* 1.3* 1.4*  CREATININE 1.39* 1.46* 1.12    Estimated Creatinine Clearance: 36.3 mL/min (by C-G formula based on SCr of 1.12 mg/dL).  Assessment: Jesus [name] a 82 y.o. male presented with concern for GIB and found to have bleeding from distant rectal polyp. S/P colonoscopy 8/6 and cleared by GI to resume warfarin. Patient was taking warfarin outpatient for h/o PAF- LD 8/1, INR therapeutic on admission at 2.3. Pharmacy consulted to re-initiate warfarin and dose lovenox to bridge until INR is therapeutic.  Anticoagulation PTA: Therapeutic INR on Warfarin 4 mg daily, except for 2 mg Mon/Fri   Today Hgb 13.3 - stable, INR 1.4 after 4mg  x3. AKI resolved, CrCl > 30. Typical dose for today would be 4mg  based on PTA regimen.  Goal of Therapy:  INR 2-3 Monitor platelets by anticoagulation protocol: Yes   Plan:  Warfarin 4 mg PO x1 today as per PTA regimen  Increase lovenox to 50mg  Q12h as AKI has resolved Monitor INR/CBC and symptoms of bleeding   Thank you for involving pharmacy in the patient's care.   Theotis Burrow, PharmD PGY1 Acute Care Pharmacy Resident  07/15/2023 7:55 AM

## 2023-07-15 NOTE — Progress Notes (Signed)
PROGRESS NOTE Jesus [name]  ZOX:[id number] DOB: [birth year] DOA: 07/07/2023 PCP: Kristian Covey, MD  Brief Narrative/Hospital Course:  82 y.o. male with medical history significant of PAF on coumadin and amiodarone, remote h/o TAVR, CAD s/p CABG 1997.  Here with bright red blood per rectum, started several days PTA.GI was consulted and patient was admitted for further management Underwent colonoscopy 8/6-1 polyp in the distal rectum removed with cold forcep diverticulosis in the sigmoid colon, descending colon, internal hemorrhoid noted.  Coumadin was resumed 8/7 Patient appeared to have deconditioning, also had episode of fever workup unremarkable for infection.  PT OT consulted advised skilled nursing facility.  He had AKI given IV fluids and creatinine trending down.  At this time is stable awaiting for placement  Subjective: Seen and examined On bedside chair no complaints Some cough, on RA A fib w/ HR in 130s at times this am Overnight patient has been afebrile, BP stable saturating 99% on room air  Labs shows INR remains subtherapeutic at 1.4 stable renal function AT 1.12 and CBC.  Assessment and Plan: Principal Problem:   Lower GI bleed Active Problems:   S/P CABG 1997   Coronary artery disease involving coronary bypass graft of native heart without angina pectoris   S/P angioplasty with stent 02/17/22 DES to proximal VG to PDA   Chronic systolic CHF (congestive heart failure) (HCC)   Paroxysmal atrial fibrillation (HCC)   Essential hypertension   Coagulopathy (HCC)   BRBPR (bright red blood per rectum)   Rectal polyp   BRBPR: In the setting of anticoagulation on Coumadin INR 2.3 6/2.Was on plavix previously, this was switched to ASA earlier last month due to being > 1 year out from DES. Seen by GI s/p Colonoscopy 07/11/23:1 polyp in the distal rectum removed with cold forcep diverticulosis in the sigmoid colon, descending colon, internal hemorrhoid noted.  Coumadin resumed  8/7. Remains stable hemodynamically and hemoglobin at 13 Recent Labs  Lab 07/10/23 2358 07/12/23 0406 07/13/23 0014 07/14/23 0322 07/15/23 0428  HGB 14.7 12.6* 12.2* 12.9* 13.3  HCT 44.8 38.0* 37.6* 38.4* 40.2    Febrile episode x 1 8/7 0025 am: Cxr/UA ordered and unremarkable.No recurrence of fever, and blood culture no growth .no recurrence of fever mild cough continue antitussives aspiration precaution   ?Fall Deconditioning/debility: Found on the floor 8/6, without obvious injury or complaints: Remains weak deconditioned.  Continue PT OT, awaiting for skilled nursing facility. CT head no acute finding.   AKI: creatinine was up and given gentle IV hydration following which AKI has resolved.  Encourage oral intake.  Avoid further IV fluid hydration given his low EF of 35%.Cont to  hold his ARB avoid nephrotoxic medication and monitor Recent Labs    10/19/22 1349 11/04/22 1045 06/05/23 0928 07/07/23 1738 07/08/23 0445 07/09/23 0427 07/12/23 0830 07/13/23 0014 07/14/23 0322 07/15/23 0428  BUN 18 18 21 18 18 17 20  25* 27* 24*  CREATININE 1.19 1.07 1.14 1.27* 1.17 1.23 1.68* 1.39* 1.46* 1.12  CO2 24 29 24 26 25 22 25 23 25  21*    CAD: Chronic systolic CHF ICM with baseline EF of 35% Elevated troponin likely demand ischemia without chest pain EKG obtained - LBBB, Sinus bradycardia- was present befiore. Checked troponing at no delta although some up- 137>125, ?demand ischemia with ICM.Systolic chf. He denied having chy chest pain. Discussed w/ Dr Cristal Deer- since no delta and no chest pain- no further intervention advised. Will do lovenox bridge while inr  subtherapeutic  He had NSTEMI w/ trops 700>1301 in 02/3022, was admitted in hospital- treated for NSVT- he had cardiac cath 02/17/22> " Severe native vessel CAD s/p 6V CABG with 6/6 patent grafts. 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 The LIMA to the mid LAD is patent and fills the mid and distal LAD  and a large diagonal branch The sequential vein graft to OM1, OM2 and OM3 is patent. The sequential vein graft to the RV marginal branch and the right PDA is patent. There is a severe stenosis in the proximal body of the SVG to the PDA> successful PTCA/DES x 1 proximal body of SVG to PDA using a distal protection device" Volume status overall stable. holding ARB, Not on any diuretics.  Continue Coumadin and aspirin, Zetia  Net IO Since Admission: -419.54 mL [07/15/23 0852]   History of TAVR for severe AS: Stable.  PAF with RVR: Rate uncontrolled this morning starting Metropol 25 twice daily, continue to dose Coumadin/Lovenox until INR therapeutic pharmacy managing. Recent Labs  Lab 07/11/23 0832 07/12/23 0406 07/13/23 0014 07/14/23 0322 07/15/23 0428  INR 2.1* 1.9* 1.4* 1.3* 1.4*     Essential hypertension BP well-controlled. On metoprolol for a fib. Holding irbesartan   Thrombocytopenia: in the setting of acute illness no obvious bleeding, trend platelet count and improving. Recent Labs  Lab 07/10/23 2358 07/12/23 0406 07/13/23 0014 07/14/23 0322 07/15/23 0428  PLT 152 91* 87* 91* 97*     DVT prophylaxis: SCDs Start: 07/07/23 2213 Code Status:   Code Status: DNR Family Communication: plan of care discussed with patient at bedside. Patient status is:  inpatient due to brbpr and deconditioning Level of care: Telemetry Medical   Dispo: The patient is from: home            Anticipated disposition: SNF once available  Objective: Vitals last 24 hrs: Vitals:   07/14/23 2044 07/14/23 2049 07/15/23 0009 07/15/23 0035  BP: 105/75 (!) 136/92 117/75 113/70  Pulse: (!) 57 91 76 93  Resp: 18 18 18 18   Temp: (!) 97.4 F (36.3 C) 97.8 F (36.6 C) 98.4 F (36.9 C) 98.2 F (36.8 C)  TempSrc:    Oral  SpO2: (!) 79%  99% 98%  Weight:      Height:       Weight change:   Physical Examination:  General exam: alert awake, oriented at baseline HEENT:Oral mucosa moist, Ear/Nose  WNL grossly Respiratory system: Bilaterally clear BS,no use of accessory muscle Cardiovascular system: S1 & S2 +, No JVD. Gastrointestinal system: Abdomen soft,NT,ND, BS+ Nervous System: Alert, awake, moving all extremities,and following commands. Extremities: LE edema neg,distal peripheral pulses palpable and warm.  Skin: No rashes,no icterus. MSK: Normal muscle bulk,tone, power   Medications reviewed:  Scheduled Meds:  amiodarone  100 mg Oral Daily   aspirin EC  81 mg Oral Daily   atorvastatin  80 mg Oral Daily   empagliflozin  10 mg Oral Daily   enoxaparin (LOVENOX) injection  50 mg Subcutaneous Q12H   ezetimibe  10 mg Oral Daily   guaiFENesin  600 mg Oral BID   insulin aspart  0-9 Units Subcutaneous Q4H   multivitamin with minerals  1 tablet Oral Daily   pantoprazole (PROTONIX) IV  40 mg Intravenous Q24H   warfarin  4 mg Oral ONCE-1600   Warfarin - Pharmacist Dosing Inpatient   Does not apply q1600  Continuous Infusions:    Diet Order  Diet Heart Room service appropriate? Yes; Fluid consistency: Thin  Diet effective now                   Intake/Output Summary (Last 24 hours) at 07/15/2023 0852 Last data filed at 07/14/2023 1715 Gross per 24 hour  Intake 720 ml  Output 600 ml  Net 120 ml    Net IO Since Admission: -419.54 mL [07/15/23 0852]  Wt Readings from Last 3 Encounters:  07/14/23 53.7 kg  07/04/23 54.9 kg  06/15/23 55.2 kg     Unresulted Labs (From admission, onward)     Start     Ordered   07/13/23 0500  Basic metabolic panel  Daily,   R     Question:  Specimen collection method  Answer:  Lab=Lab collect   07/12/23 1154   07/13/23 0500  CBC  Daily,   R     Question:  Specimen collection method  Answer:  Lab=Lab collect   07/12/23 1154   07/12/23 0500  Protime-INR  Daily,   R     Question:  Specimen collection method  Answer:  Lab=Lab collect   07/11/23 0750          Data Reviewed: I have personally reviewed following labs and  imaging studies CBC: Recent Labs  Lab 07/09/23 0427 07/10/23 0422 07/10/23 2358 07/12/23 0406 07/13/23 0014 07/14/23 0322 07/15/23 0428  WBC 6.9   < > 11.3* 5.8 4.7 3.8* 5.3  NEUTROABS 4.7  --   --   --   --   --   --   HGB 13.0   < > 14.7 12.6* 12.2* 12.9* 13.3  HCT 40.2   < > 44.8 38.0* 37.6* 38.4* 40.2  MCV 101.0*   < > 100.2* 102.7* 100.5* 102.1* 101.0*  PLT 129*   < > 152 91* 87* 91* 97*   < > = values in this interval not displayed.   Basic Metabolic Panel: Recent Labs  Lab 07/08/23 1009 07/09/23 0427 07/12/23 0830 07/13/23 0014 07/14/23 0322 07/15/23 0428  NA  --  137 137 135 137 136  K  --  4.0 4.2 4.0 4.2 4.0  CL  --  104 100 104 103 104  CO2  --  22 25 23 25  21*  GLUCOSE  --  79 97 112* 98 85  BUN  --  17 20 25* 27* 24*  CREATININE  --  1.23 1.68* 1.39* 1.46* 1.12  CALCIUM  --  8.5* 8.5* 7.8* 8.3* 7.9*  MG 2.3 2.1  --   --   --   --   PHOS  --  4.1  --   --   --   --   Estimated Creatinine Clearance: 36.3 mL/min (by C-G formula based on SCr of 1.12 mg/dL). Recent Labs  Lab 07/09/23 0427  AST 30  ALT 28  ALKPHOS 92  BILITOT 1.7*  PROT 6.0*  ALBUMIN 3.2*   Recent Labs  Lab 07/11/23 1308 07/12/23 0406 07/13/23 0014 07/14/23 0322 07/15/23 0428  INR 2.1* 1.9* 1.4* 1.3* 1.4*   No results for input(s): "HGBA1C" in the last 72 hours. CBG: Recent Labs  Lab 07/14/23 1238 07/14/23 1557 07/14/23 2050 07/15/23 0011 07/15/23 0552  GLUCAP 77 122* 97 95 93   Recent Results (from the past 240 hour(s))  Culture, blood (Routine X 2) w Reflex to ID Panel     Status: None (Preliminary result)   Collection Time: 07/12/23  4:05 AM  Specimen: BLOOD RIGHT ARM  Result Value Ref Range Status   Specimen Description BLOOD RIGHT ARM  Final   Special Requests   Final    BOTTLES DRAWN AEROBIC AND ANAEROBIC Blood Culture adequate volume   Culture   Final    NO GROWTH 2 DAYS Performed at [facility], [address], [place], [place] [address]    Report  Status PENDING  Incomplete  Culture, blood (Routine X 2) w Reflex to ID Panel     Status: None (Preliminary result)   Collection Time: 07/12/23  4:08 AM   Specimen: BLOOD RIGHT HAND  Result Value Ref Range Status   Specimen Description BLOOD RIGHT HAND  Final   Special Requests   Final    BOTTLES DRAWN AEROBIC AND ANAEROBIC Blood Culture adequate volume   Culture   Final    NO GROWTH 2 DAYS Performed at [facility], [address] [place], [place] [address]    Report Status PENDING  Incomplete    Antimicrobials: Anti-infectives (From admission, onward)    None     Culture/Microbiology    Component Value Date/Time   SDES BLOOD RIGHT HAND 07/12/2023 0408   SPECREQUEST  07/12/2023 0408    BOTTLES DRAWN AEROBIC AND ANAEROBIC Blood Culture adequate volume   CULT  07/12/2023 0408    NO GROWTH 2 DAYS Performed at [facility], [address], [place], [place] [address]    REPTSTATUS PENDING 07/12/2023 0408  Other culture-see note Radiology Studies: No results found.   LOS: 4 days   Lanae Boast, MD [facility]  07/15/2023, 8:52 AM

## 2023-07-15 NOTE — Plan of Care (Signed)
  Problem: Coping: Goal: Ability to adjust to condition or change in health will improve Outcome: Progressing   Problem: Metabolic: Goal: Ability to maintain appropriate glucose levels will improve Outcome: Progressing   Problem: Skin Integrity: Goal: Risk for impaired skin integrity will decrease Outcome: Progressing   Problem: Activity: Goal: Risk for activity intolerance will decrease Outcome: Progressing

## 2023-07-15 NOTE — Plan of Care (Signed)

## 2023-07-16 DIAGNOSIS — R590 Localized enlarged lymph nodes: Secondary | ICD-10-CM | POA: Diagnosis not present

## 2023-07-16 DIAGNOSIS — Z95820 Peripheral vascular angioplasty status with implants and grafts: Secondary | ICD-10-CM | POA: Diagnosis not present

## 2023-07-16 DIAGNOSIS — K621 Rectal polyp: Secondary | ICD-10-CM | POA: Diagnosis not present

## 2023-07-16 DIAGNOSIS — R58 Hemorrhage, not elsewhere classified: Secondary | ICD-10-CM | POA: Diagnosis not present

## 2023-07-16 DIAGNOSIS — Z743 Need for continuous supervision: Secondary | ICD-10-CM | POA: Diagnosis not present

## 2023-07-16 DIAGNOSIS — K922 Gastrointestinal hemorrhage, unspecified: Secondary | ICD-10-CM | POA: Diagnosis not present

## 2023-07-16 DIAGNOSIS — N179 Acute kidney failure, unspecified: Secondary | ICD-10-CM | POA: Diagnosis not present

## 2023-07-16 DIAGNOSIS — I480 Paroxysmal atrial fibrillation: Secondary | ICD-10-CM | POA: Diagnosis not present

## 2023-07-16 DIAGNOSIS — K5730 Diverticulosis of large intestine without perforation or abscess without bleeding: Secondary | ICD-10-CM | POA: Diagnosis not present

## 2023-07-16 DIAGNOSIS — I2510 Atherosclerotic heart disease of native coronary artery without angina pectoris: Secondary | ICD-10-CM | POA: Diagnosis not present

## 2023-07-16 DIAGNOSIS — R918 Other nonspecific abnormal finding of lung field: Secondary | ICD-10-CM | POA: Diagnosis not present

## 2023-07-16 DIAGNOSIS — K219 Gastro-esophageal reflux disease without esophagitis: Secondary | ICD-10-CM | POA: Diagnosis not present

## 2023-07-16 DIAGNOSIS — R059 Cough, unspecified: Secondary | ICD-10-CM | POA: Diagnosis not present

## 2023-07-16 DIAGNOSIS — R531 Weakness: Secondary | ICD-10-CM | POA: Diagnosis not present

## 2023-07-16 DIAGNOSIS — E119 Type 2 diabetes mellitus without complications: Secondary | ICD-10-CM | POA: Diagnosis not present

## 2023-07-16 DIAGNOSIS — Z79899 Other long term (current) drug therapy: Secondary | ICD-10-CM | POA: Diagnosis not present

## 2023-07-16 DIAGNOSIS — I4819 Other persistent atrial fibrillation: Secondary | ICD-10-CM | POA: Diagnosis not present

## 2023-07-16 DIAGNOSIS — E785 Hyperlipidemia, unspecified: Secondary | ICD-10-CM | POA: Diagnosis not present

## 2023-07-16 DIAGNOSIS — D689 Coagulation defect, unspecified: Secondary | ICD-10-CM | POA: Diagnosis not present

## 2023-07-16 DIAGNOSIS — I5023 Acute on chronic systolic (congestive) heart failure: Secondary | ICD-10-CM | POA: Diagnosis not present

## 2023-07-16 DIAGNOSIS — R0989 Other specified symptoms and signs involving the circulatory and respiratory systems: Secondary | ICD-10-CM | POA: Diagnosis not present

## 2023-07-16 DIAGNOSIS — I499 Cardiac arrhythmia, unspecified: Secondary | ICD-10-CM | POA: Diagnosis not present

## 2023-07-16 DIAGNOSIS — Z7401 Bed confinement status: Secondary | ICD-10-CM | POA: Diagnosis not present

## 2023-07-16 DIAGNOSIS — R079 Chest pain, unspecified: Secondary | ICD-10-CM | POA: Diagnosis not present

## 2023-07-16 DIAGNOSIS — I10 Essential (primary) hypertension: Secondary | ICD-10-CM | POA: Diagnosis not present

## 2023-07-16 DIAGNOSIS — K625 Hemorrhage of anus and rectum: Secondary | ICD-10-CM | POA: Diagnosis not present

## 2023-07-16 LAB — BASIC METABOLIC PANEL WITH GFR
Anion gap: 8 (ref 5–15)
BUN: 26 mg/dL — ABNORMAL HIGH (ref 8–23)
CO2: 24 mmol/L (ref 22–32)
Calcium: 8.1 mg/dL — ABNORMAL LOW (ref 8.9–10.3)
Chloride: 105 mmol/L (ref 98–111)
Creatinine, Ser: 1.29 mg/dL — ABNORMAL HIGH (ref 0.61–1.24)
GFR, Estimated: 55 mL/min — ABNORMAL LOW (ref >60)
Glucose, Bld: 96 mg/dL (ref 70–99)
Potassium: 3.9 mmol/L (ref 3.5–5.1)
Sodium: 137 mmol/L (ref 135–145)

## 2023-07-16 LAB — CBC
HCT: 37.1 % — ABNORMAL LOW (ref 39.0–52.0)
Hemoglobin: 12.2 g/dL — ABNORMAL LOW (ref 13.0–17.0)
MCH: 32.5 pg (ref 26.0–34.0)
MCHC: 32.9 g/dL (ref 30.0–36.0)
MCV: 98.9 fL (ref 80.0–100.0)
Platelets: 93 10*3/uL — ABNORMAL LOW (ref 150–400)
RBC: 3.75 MIL/uL — ABNORMAL LOW (ref 4.22–5.81)
RDW: 13.5 % (ref 11.5–15.5)
WBC: 5.8 10*3/uL (ref 4.0–10.5)
nRBC: 0 % (ref 0.0–0.2)

## 2023-07-16 LAB — GLUCOSE, CAPILLARY
Glucose-Capillary: 100 mg/dL — ABNORMAL HIGH (ref 70–99)
Glucose-Capillary: 91 mg/dL (ref 70–99)

## 2023-07-16 LAB — PROTIME-INR
INR: 2 — ABNORMAL HIGH (ref 0.8–1.2)
Prothrombin Time: 22.6 s — ABNORMAL HIGH (ref 11.4–15.2)

## 2023-07-16 MED ORDER — METOPROLOL TARTRATE 25 MG PO TABS: 12.5000 mg | ORAL_TABLET | Freq: Two times a day (BID) | ORAL | Status: DC

## 2023-07-16 MED ORDER — PANTOPRAZOLE SODIUM 40 MG PO TBEC: 40.0000 mg | DELAYED_RELEASE_TABLET | Freq: Every day | ORAL | Status: DC

## 2023-07-16 MED ORDER — WARFARIN SODIUM 2 MG PO TABS
2.0000 mg | ORAL_TABLET | Freq: Once | ORAL | Status: DC
  Filled 2023-07-16: qty 1

## 2023-07-16 MED ORDER — GUAIFENESIN-DM 100-10 MG/5ML PO SYRP: 10.0000 mL | ORAL_SOLUTION | ORAL | 0 refills | Status: AC | PRN

## 2023-07-16 MED ORDER — GUAIFENESIN ER 600 MG PO TB12: 600.0000 mg | ORAL_TABLET | Freq: Two times a day (BID) | ORAL | Status: AC

## 2023-07-16 NOTE — Progress Notes (Addendum)
Pharmacy Consult for warfarin and Lovenox Indication: atrial fibrillation  Allergies  Allergen Reactions   Amlodipine Other (See Comments)    Swelling?   Codeine Other (See Comments)    Pt does not remember   Iodine Swelling    Patient Measurements: Height: 5\' 1"  (154.9 cm) Weight: 53.7 kg (118 lb 6.2 oz) IBW/kg (Calculated) : 52.3   Vital Signs: Temp: 97 F (36.1 C) (08/11 0855) BP: 108/87 (08/11 0855) Pulse Rate: 66 (08/11 0855)  Labs: Recent Labs    07/14/23 0322 07/15/23 0428 07/16/23 0324  HGB 12.9* 13.3 12.2*  HCT 38.4* 40.2 37.1*  PLT 91* 97* 93*  LABPROT 16.5* 17.5* 22.6*  INR 1.3* 1.4* 2.0*  CREATININE 1.46* 1.12 1.29*    Estimated Creatinine Clearance: 31.5 mL/min (A) (by C-G formula based on SCr of 1.29 mg/dL (H)).  Assessment: Jesus [name] a 82 y.o. male presented with concern for GIB and found to have bleeding from distant rectal polyp. S/P colonoscopy 8/6 and cleared by GI to resume warfarin. Patient was taking warfarin outpatient for h/o PAF- LD 8/1, INR therapeutic on admission at 2.3. Pharmacy consulted to re-initiate warfarin and dose lovenox to bridge until INR is therapeutic.  Anticoagulation PTA: Therapeutic INR on Warfarin 4 mg daily, except for 2 mg Mon/Fri   Today Hgb 12.2 - stable, INR 2 after 4mg  x4. AKI resolved, CrCl > 30. Typical dose today would be 4 mg per PTA regimen but due to sharp INR increase will reduce today's dose.   Of note: lovenox discontinued 8/10, last dose at 2000.  Goal of Therapy:  INR 2-3 Monitor platelets by anticoagulation protocol: Yes   Plan:  Warfarin 2 mg PO x1 today due to sharp increase in INR (1.4>2)  Monitor INR/CBC and symptoms of bleeding   Thank you for involving pharmacy in the patient's care.   Theotis Burrow, PharmD PGY1 Acute Care Pharmacy Resident  07/16/2023 9:47 AM

## 2023-07-16 NOTE — Progress Notes (Signed)
Report given to  April, RN at [facility].

## 2023-07-16 NOTE — TOC Transition Note (Signed)
Transition of Care [facility]) - CM/SW Discharge Note   Patient Details  Name: Jesus [name] MRN: [id number] Date of Birth: 1941-10-07  Transition of Care [facility]) CM/SW Contact:  Deatra Robinson, [place] Phone Number: 07/16/2023, 10:44 AM   Clinical Narrative:  Berkley Harvey received after successful peer-to-peer Berkley Harvey ID [id number]) for [facility]. Confirmed with French Ana at [facility] they are prepared to admit pt to room 210. Pt's niece Synetta Fail aware of dc and reports agreeable. RN provided with number for report and PTAR arranged for transport. SW signing off at dc.   Dellie Burns, MSW, LCSW [phone] (coverage)       Final next level of care: Skilled Nursing Facility Barriers to Discharge: No Barriers Identified   Patient Goals and CMS Choice CMS Medicare.gov Compare Post Acute Care list provided to:: Patient Choice offered to / list presented to : Patient  Discharge Placement                Patient chooses bed at: [facility] Patient to be transferred to facility by: [facility] Name of family member notified: Anita/niece Patient and family notified of of transfer: 07/16/23  Discharge Plan and Services Additional resources added to the After Visit Summary for                                       Social Determinants of Health (SDOH) Interventions SDOH Screenings   Food Insecurity: No Food Insecurity (07/08/2023)  Housing: Low Risk  (07/08/2023)  Transportation Needs: No Transportation Needs (07/08/2023)  Utilities: Not At Risk (07/08/2023)  Alcohol Screen: Low Risk  (07/28/2022)  Depression (PHQ2-9): Low Risk  (09/07/2022)  Financial Resource Strain: Low Risk  (01/24/2023)  Physical Activity: Sufficiently Active (07/28/2022)  Social Connections: Moderately Integrated (07/28/2022)  Stress: No Stress Concern Present (07/28/2022)  Tobacco Use: Low Risk  (07/11/2023)     Readmission Risk Interventions    07/12/2023    9:03 AM 03/04/2022    3:49 PM   Readmission Risk Prevention Plan  Post Dischage Appt Complete   Medication Screening Complete   Transportation Screening Complete Complete  Medication Review (RN Care Manager)  Complete  PCP or Specialist appointment within 3-5 days of discharge  Complete  HRI or Home Care Consult  Complete  SW Recovery Care/Counseling Consult  Complete  Palliative Care Screening  Not Applicable  Skilled Nursing Facility  Complete

## 2023-07-16 NOTE — Discharge Summary (Addendum)
Physician Discharge Summary  JR KOVACICH NWG:[id number] DOB: Sep 09, 1941 DOA: 07/07/2023  PCP: Kristian Covey, MD  Admit date: 07/07/2023 Discharge date: 07/16/2023 Recommendations for Outpatient Follow-up:  Follow up with PCP in 1 weeks-call for appointment F/u with cardiology 1-2 week Dr Copper office and monitor fluid status and weight at [facility]. Please obtain BMP/CBC in one week  Discharge Dispo: SNF Discharge Condition: Stable Code Status:   Code Status: DNR Diet recommendation:  Diet Order             Diet Heart Room service appropriate? Yes; Fluid consistency: Thin  Diet effective now                  Brief/Interim Summary: 82 y.o. male with medical history significant of PAF on coumadin and amiodarone, remote h/o TAVR, CAD s/p CABG 1997.  Here with bright red blood per rectum, started several days PTA.GI was consulted and patient was admitted for further management Underwent colonoscopy 8/6-1 polyp in the distal rectum removed with cold forcep diverticulosis in the sigmoid colon, descending colon, internal hemorrhoid noted.  Coumadin was resumed 8/7 Patient appeared to have deconditioning, also had episode of fever workup unremarkable for infection.  PT OT consulted advised skilled nursing facility.  He had AKI given IV fluids and creatinine trending down.  At this time is stable awaiting for placement.. peer to peer review completed 8/10 and accepted for SNF  Called nieces and April and updated.  Discharge Diagnoses:  Principal Problem:   Lower GI bleed Active Problems:   S/P CABG 1997   Coronary artery disease involving coronary bypass graft of native heart without angina pectoris   S/P angioplasty with stent 02/17/22 DES to proximal VG to PDA   Chronic systolic CHF (congestive heart failure) (HCC)   Paroxysmal atrial fibrillation (HCC)   Essential hypertension   Coagulopathy (HCC)   BRBPR (bright red blood per rectum)   Rectal polyp  BRBPR: In the setting of  anticoagulation on Coumadin INR 2.3 6/2.Was on plavix previously, this was switched to ASA earlier last month due to being > 1 year out from DES. Seen by GI s/p Colonoscopy 07/11/23:1 polyp in the distal rectum removed with cold forcep diverticulosis in the sigmoid colon, descending colon, internal hemorrhoid noted.  Coumadin resumed 8/7. Remains stable hemodynamically and hemoglobin at 13. Moving bowel and no bleeding now. Recent Labs  Lab 07/12/23 0406 07/13/23 0014 07/14/23 0322 07/15/23 0428 07/16/23 0324  HGB 12.6* 12.2* 12.9* 13.3 12.2*  HCT 38.0* 37.6* 38.4* 40.2 37.1*    Febrile episode x 1 8/7 0025 am: Cxr/UA ordered and unremarkable.No recurrence of fever, and blood culture no growth .no recurrence of fever mild cough continue antitussives aspiration precaution   ?Fall Deconditioning/debility: Found on the floor 8/6, without obvious injury or complaints: Remains weak deconditioned.  Continue PT OT, awaiting for skilled nursing facility. CT head no acute finding.   AKI: creatinine was up and given gentle IV hydration following which AKI has resolved.  Encourage oral intake.  Avoid further IV fluid hydration given his low EF of 35%.Cont to  hold his ARB for now, BP soft. Avoid nephrotoxic medication and monitor Recent Labs    11/04/22 1045 06/05/23 0928 07/07/23 1738 07/08/23 0445 07/09/23 0427 07/12/23 0830 07/13/23 0014 07/14/23 0322 07/15/23 0428 07/16/23 0324  BUN 18 21 18 18 17 20  25* 27* 24* 26*  CREATININE 1.07 1.14 1.27* 1.17 1.23 1.68* 1.39* 1.46* 1.12 1.29*  CO2 29 24 26 25  22  25 23 25  21* 24    CAD: Chronic systolic CHF ICM with baseline EF of 35% Elevated troponin likely demand ischemia without chest pain EKG obtained - LBBB, Sinus bradycardia- was present befiore. Checked troponing at no delta although some up- 137>125, ?demand ischemia with ICM.Systolic chf. He denied having chy chest pain. Discussed w/ Dr Cristal Deer- since no delta and no chest pain- no  further intervention advised. INR therapeutic now.  Continue metoprolol, ARB.  Due to soft BP and AKI. Advised to monitor weight leg swelling and fluid status at [facility] and updated April, advised cardiology follow up soon.   On chart review>He had NSTEMI w/ trops 700>1301 in 02/3022, was admitted in hospital- treated for NSVT- he had cardiac cath 02/17/22> " Severe native vessel CAD s/p 6V CABG with 6/6 patent grafts. 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 The LIMA to the mid LAD is patent and fills the mid and distal LAD and a large diagonal branch. The sequential vein graft to OM1, OM2 and OM3 is patent. The sequential vein graft to the RV marginal branch and the right PDA is patent. There is a severe stenosis in the proximal body of the SVG to the PDA> successful PTCA/DES x 1 proximal body of SVG to PDA using a distal protection device" Volume status overall stable. holding ARB, Not on any diuretics.  Continue Coumadin and aspirin, Zetia  Net IO Since Admission: -19.54 mL [07/16/23 0957]   History of TAVR for severe AS: Stable.  PAF with RVR: Rate uncontrolled  8/10 morning started Metropol 12.5 -no recurrence of RVR, INR therapeutic and Coumadin.  Recent Labs  Lab 07/12/23 0406 07/13/23 0014 07/14/23 0322 07/15/23 0428 07/16/23 0324  INR 1.9* 1.4* 1.3* 1.4* 2.0*     Essential hypertension BP well-controlled. On metoprolol for a fib. Holding irbesartan   Thrombocytopenia: in the setting of acute illness no obvious bleeding, trend platelet count and improving. Recent Labs  Lab 07/12/23 0406 07/13/23 0014 07/14/23 0322 07/15/23 0428 07/16/23 0324  PLT 91* 87* 91* 97* 93*    Consults: gi Subjective: Aaox3, heard of hearing. Feels ready to [facility] SNF today and is happy about it.   Discharge Exam: Vitals:   07/16/23 0401 07/16/23 0855  BP: 95/70 108/87  Pulse: 99 66  Resp:  17  Temp: 97.8 F (36.6 C) (!) 97 F (36.1 C)  SpO2: 97% 98%   General: Pt is  alert, awake, not in acute distress Cardiovascular: RRR, S1/S2 +, no rubs, no gallops Respiratory: CTA bilaterally, no wheezing, no rhonchi Abdominal: Soft, NT, ND, bowel sounds + Extremities: no edema, no cyanosis  Discharge Instructions  Discharge Instructions     (HEART FAILURE PATIENTS) Call MD:  Anytime you have any of the following symptoms: 1) 3 pound weight gain in 24 hours or 5 pounds in 1 week 2) shortness of breath, with or without a dry hacking cough 3) swelling in the hands, feet or stomach 4) if you have to sleep on extra pillows at night in order to breathe.   Complete by: As directed    Discharge instructions   Complete by: As directed    Please call call MD or return to ER for similar or worsening recurring problem that brought you to hospital or if any fever,nausea/vomiting,abdominal pain, uncontrolled pain, chest pain,  shortness of breath or any other alarming symptoms.  Please follow-up your doctor as instructed in a week time and call the office for appointment.  Please avoid alcohol, smoking, or any other illicit substance and maintain healthy habits including taking your regular medications as prescribed.  You were cared for by a hospitalist during your hospital stay. If you have any questions about your discharge medications or the care you received while you were in the hospital after you are discharged, you can call the unit and ask to speak with the hospitalist on call if the hospitalist that took care of you is not available.  Once you are discharged, your primary care physician will handle any further medical issues. Please note that NO REFILLS for any discharge medications will be authorized once you are discharged, as it is imperative that you return to your primary care physician (or establish a relationship with a primary care physician if you do not have one) for your aftercare needs so that they can reassess your need for medications and monitor your lab  values   Increase activity slowly   Complete by: As directed       Allergies as of 07/16/2023       Reactions   Amlodipine Other (See Comments)   Swelling?   Codeine Other (See Comments)   Pt does not remember   Iodine Swelling        Medication List     STOP taking these medications    valsartan 80 MG tablet Commonly known as: DIOVAN       TAKE these medications    acetaminophen 325 MG tablet Commonly known as: TYLENOL Take 1-2 tablets (325-650 mg total) by mouth every 4 (four) hours as needed for mild pain.   amiodarone 200 MG tablet Commonly known as: PACERONE Take 0.5 tablets (100 mg total) by mouth daily.   amoxicillin 500 MG capsule Commonly known as: AMOXIL TAKE FOUR CAPSULES BY MOUTH ONE hour prior TO dental procedure   aspirin EC 81 MG tablet Take 1 tablet (81 mg total) by mouth daily. Swallow whole.   atorvastatin 80 MG tablet Commonly known as: LIPITOR TAKE 1 TABLET BY MOUTH EVERY DAY   ezetimibe 10 MG tablet Commonly known as: Zetia Take 1 tablet (10 mg total) by mouth daily.   guaiFENesin 600 MG 12 hr tablet Commonly known as: MUCINEX Take 1 tablet (600 mg total) by mouth 2 (two) times daily for 5 days.   guaiFENesin-dextromethorphan 100-10 MG/5ML syrup Commonly known as: ROBITUSSIN DM Take 10 mLs by mouth every 4 (four) hours as needed for cough.   Jardiance 10 MG Tabs tablet Generic drug: empagliflozin TAKE 1 TABLET BY MOUTH EVERY DAY   metoprolol tartrate 25 MG tablet Commonly known as: LOPRESSOR Take 0.5 tablets (12.5 mg total) by mouth 2 (two) times daily.   multivitamin,tx-minerals tablet Take 1 tablet by mouth daily.   nitroGLYCERIN 0.4 MG SL tablet Commonly known as: NITROSTAT Place 1 tablet (0.4 mg total) under the tongue every 5 (five) minutes x 3 doses as needed for chest pain.   pantoprazole 40 MG tablet Commonly known as: Protonix Take 1 tablet (40 mg total) by mouth daily.   vitamin C 1000 MG tablet Take 1,000  mg by mouth daily.   warfarin 4 MG tablet Commonly known as: COUMADIN Take as directed. If you are unsure how to take this medication, talk to your nurse or doctor. Original instructions: TAKE 1 TABLET BY MOUTH DAILY EXCEPT TAKE 1/2 TABLET ON MONDAYS AND FRIDAYS OR AS DIRECTED BY ANTICOAGULATION CLINIC        Contact information for follow-up providers  Kristian Covey, MD Follow up in 1 week(s).   Specialty: Family Medicine Contact information: [address] [place] [address] [phone]         [name], MD Follow up in 1 week(s).   Specialty: Cardiology Contact information: [address] [place] [address] [phone]              Contact information for after-discharge care     Destination     [facility] [place] Preferred SNF .   Service: Skilled Nursing Contact information: [address] [place] [address] [phone]                    Allergies  Allergen Reactions   Amlodipine Other (See Comments)    Swelling?   Codeine Other (See Comments)    Pt does not remember   Iodine Swelling    The results of significant diagnostics from this hospitalization (including imaging, microbiology, ancillary and laboratory) are listed below for reference.    Microbiology: Recent Results (from the past 240 hour(s))  Culture, blood (Routine X 2) w Reflex to ID Panel     Status: None (Preliminary result)   Collection Time: 07/12/23  4:05 AM   Specimen: BLOOD RIGHT ARM  Result Value Ref Range Status   Specimen Description BLOOD RIGHT ARM  Final   Special Requests   Final    BOTTLES DRAWN AEROBIC AND ANAEROBIC Blood Culture adequate volume   Culture   Final    NO GROWTH 3 DAYS Performed at [facility], [address], [place], [place] [address]    Report Status PENDING  Incomplete  Culture, blood (Routine X 2) w Reflex to ID Panel     Status: None  (Preliminary result)   Collection Time: 07/12/23  4:08 AM   Specimen: BLOOD RIGHT HAND  Result Value Ref Range Status   Specimen Description BLOOD RIGHT HAND  Final   Special Requests   Final    BOTTLES DRAWN AEROBIC AND ANAEROBIC Blood Culture adequate volume   Culture   Final    NO GROWTH 3 DAYS Performed at [facility], [address], [place], [place] [address]    Report Status PENDING  Incomplete    Procedures/Studies: CT HEAD WO CONTRAST ( )  Result Date: 07/12/2023 CLINICAL DATA:  Headache EXAM: CT HEAD WITHOUT CONTRAST TECHNIQUE: Contiguous axial images were obtained from the base of the skull through the vertex without intravenous contrast. RADIATION DOSE REDUCTION: This exam was performed according to the departmental dose-optimization program which includes automated exposure control, adjustment of the mA and/or kV according to patient size and/or use of iterative reconstruction technique. COMPARISON:  Brain MRI 09/21/2007 FINDINGS: Brain: There is no acute intracranial hemorrhage, extra-axial fluid collection, or acute territorial infarct. Background parenchymal volume is normal for age. The ventricles are normal in size. Small remote appearing infarcts are noted in the left basal ganglia. No cortical encephalomalacia is seen. There is no mass lesion. The pituitary and suprasellar region are normal. There is no mass effect or midline shift. Vascular: There is calcification of the bilateral carotid siphons and vertebral arteries. Skull: Normal. Negative for fracture or focal lesion. Sinuses/Orbits: There is mild mucosal thickening in the paranasal sinuses. Bilateral lens implants are in place. The globes and orbits are otherwise unremarkable. Other: The mastoid air cells and middle ear cavities are clear. IMPRESSION: 1. No acute intracranial pathology. 2. Small remote appearing infarcts  in the left basal ganglia. Electronically Signed   By: Lesia Hausen M.D.   On: 07/12/2023 10:41    DG CHEST PORT 1 VIEW  Result Date: 07/12/2023 CLINICAL DATA:  Fever EXAM: PORTABLE CHEST 1 VIEW COMPARISON:  10/25/2022 FINDINGS: Claudie Fisherman overlies the lung apices. Chronic interstitial thickening again noted. No superimposed confluent pulmonary infiltrate. No definite pneumothorax. No pleural effusion. Coronary artery bypass grafting and transcatheter aortic valve replacement have been performed. Cardiac size is within normal limits. No acute bone abnormality. IMPRESSION: 1. No radiographic evidence of acute cardiopulmonary disease. Electronically Signed   By: Helyn Numbers M.D.   On: 07/12/2023 02:30    Labs: BNP (last 3 results) No results for input(s): "BNP" in the last 8760 hours. Basic Metabolic Panel: Recent Labs  Lab 07/12/23 0830 07/13/23 0014 07/14/23 0322 07/15/23 0428 07/16/23 0324  NA 137 135 137 136 137  K 4.2 4.0 4.2 4.0 3.9  CL 100 104 103 104 105  CO2 25 23 25  21* 24  GLUCOSE 97 112* 98 85 96  BUN 20 25* 27* 24* 26*  CREATININE 1.68* 1.39* 1.46* 1.12 1.29*  CALCIUM 8.5* 7.8* 8.3* 7.9* 8.1*   Liver Function Tests: No results for input(s): "AST", "ALT", "ALKPHOS", "BILITOT", "PROT", "ALBUMIN" in the last 168 hours. No results for input(s): "LIPASE", "AMYLASE" in the last 168 hours. No results for input(s): "AMMONIA" in the last 168 hours. CBC: Recent Labs  Lab 07/12/23 0406 07/13/23 0014 07/14/23 0322 07/15/23 0428 07/16/23 0324  WBC 5.8 4.7 3.8* 5.3 5.8  HGB 12.6* 12.2* 12.9* 13.3 12.2*  HCT 38.0* 37.6* 38.4* 40.2 37.1*  MCV 102.7* 100.5* 102.1* 101.0* 98.9  PLT 91* 87* 91* 97* 93*   Cardiac Enzymes: No results for input(s): "CKTOTAL", "CKMB", "CKMBINDEX", "TROPONINI" in the last 168 hours. BNP: Invalid input(s): "POCBNP" CBG: Recent Labs  Lab 07/15/23 1234 07/15/23 1604 07/15/23 2023 07/16/23 0018 07/16/23 0411  GLUCAP 149* 113* 121* 100* 91   D-Dimer No results for input(s): "DDIMER" in the last 72 hours. Hgb A1c No results for input(s):  "HGBA1C" in the last 72 hours. Lipid Profile No results for input(s): "CHOL", "HDL", "LDLCALC", "TRIG", "CHOLHDL", "LDLDIRECT" in the last 72 hours. Thyroid function studies No results for input(s): "TSH", "T4TOTAL", "T3FREE", "THYROIDAB" in the last 72 hours.  Invalid input(s): "FREET3" Anemia work up No results for input(s): "VITAMINB12", "FOLATE", "FERRITIN", "TIBC", "IRON", "RETICCTPCT" in the last 72 hours. Urinalysis    Component Value Date/Time   COLORURINE AMBER (A) 07/12/2023 0907   APPEARANCEUR HAZY (A) 07/12/2023 0907   LABSPEC 1.021 07/12/2023 0907   PHURINE 5.0 07/12/2023 0907   GLUCOSEU NEGATIVE 07/12/2023 0907   GLUCOSEU NEGATIVE 01/29/2010 1351   HGBUR NEGATIVE 07/12/2023 0907   BILIRUBINUR NEGATIVE 07/12/2023 0907   BILIRUBINUR n 08/27/2014 1657   KETONESUR 5 (A) 07/12/2023 0907   PROTEINUR 30 (A) 07/12/2023 0907   UROBILINOGEN 1.0 03/06/2015 1446   NITRITE NEGATIVE 07/12/2023 0907   LEUKOCYTESUR NEGATIVE 07/12/2023 0907   Sepsis Labs Recent Labs  Lab 07/13/23 0014 07/14/23 0322 07/15/23 0428 07/16/23 0324  WBC 4.7 3.8* 5.3 5.8   Microbiology Recent Results (from the past 240 hour(s))  Culture, blood (Routine X 2) w Reflex to ID Panel     Status: None (Preliminary result)   Collection Time: 07/12/23  4:05 AM   Specimen: BLOOD RIGHT ARM  Result Value Ref Range Status   Specimen Description BLOOD RIGHT ARM  Final   Special Requests   Final  BOTTLES DRAWN AEROBIC AND ANAEROBIC Blood Culture adequate volume   Culture   Final    NO GROWTH 3 DAYS Performed at [facility], [address] [place], [place] [address]    Report Status PENDING  Incomplete  Culture, blood (Routine X 2) w Reflex to ID Panel     Status: None (Preliminary result)   Collection Time: 07/12/23  4:08 AM   Specimen: BLOOD RIGHT HAND  Result Value Ref Range Status   Specimen Description BLOOD RIGHT HAND  Final   Special Requests   Final    BOTTLES DRAWN AEROBIC AND  ANAEROBIC Blood Culture adequate volume   Culture   Final    NO GROWTH 3 DAYS Performed at [facility], [address] [place], [place] [address]    Report Status PENDING  Incomplete     Time coordinating discharge: 35 minutes  SIGNED: Lanae Boast, MD  [facility] 07/16/2023, 9:57 AM  If 7PM-7AM, please contact night-coverage [email or web address]

## 2023-07-16 NOTE — Plan of Care (Signed)
  Problem: Coping: Goal: Ability to adjust to condition or change in health will improve Outcome: Progressing   Problem: Fluid Volume: Goal: Ability to maintain a balanced intake and output will improve Outcome: Progressing   Problem: Metabolic: Goal: Ability to maintain appropriate glucose levels will improve Outcome: Progressing   Problem: Skin Integrity: Goal: Risk for impaired skin integrity will decrease Outcome: Progressing

## 2023-07-24 ENCOUNTER — Other Ambulatory Visit: Payer: Self-pay | Admitting: Family Medicine

## 2023-07-26 ENCOUNTER — Ambulatory Visit (HOSPITAL_COMMUNITY)
Admission: RE | Admit: 2023-07-26 | Discharge: 2023-07-26 | Disposition: A | Discharge status: Home / Self Care | Payer: Medicare Other | Source: Ambulatory Visit | Attending: Internal Medicine | Admitting: Internal Medicine

## 2023-07-26 DIAGNOSIS — I4819 Other persistent atrial fibrillation: Secondary | ICD-10-CM | POA: Insufficient documentation

## 2023-07-26 DIAGNOSIS — R0989 Other specified symptoms and signs involving the circulatory and respiratory systems: Secondary | ICD-10-CM | POA: Insufficient documentation

## 2023-07-26 DIAGNOSIS — R918 Other nonspecific abnormal finding of lung field: Secondary | ICD-10-CM | POA: Diagnosis not present

## 2023-07-26 DIAGNOSIS — Z79899 Other long term (current) drug therapy: Secondary | ICD-10-CM | POA: Diagnosis not present

## 2023-07-26 DIAGNOSIS — R590 Localized enlarged lymph nodes: Secondary | ICD-10-CM | POA: Diagnosis not present

## 2023-07-30 ENCOUNTER — Other Ambulatory Visit: Payer: Self-pay | Admitting: Cardiology

## 2023-07-31 ENCOUNTER — Telehealth: Payer: Self-pay

## 2023-07-31 NOTE — Telephone Encounter (Signed)
Pt's sister-in-law, Jesus [name], reports pt was inpt from 8/2 to 8/11 for GI bleed and then was transferred to [facility] for one week. She reports SNF took last INR on 8/19 and told her it was 3.5. She said they changed warfarin dosing at that time from 4 mg daily except take 2 mg on Mondays and Thursdays. They changed dosing to 4 mg daily except take 2 mg on Mon, Wed and Thurs. This is the dose the pt is currently taking since leave SNF.   She reports it is difficult to take pt to all of his apts and would like INR checked at his apt with PCP on 8/28 and cancel the pts apt for tomorrow for INR check. Advised his coumadin clinic apt will be cancelled for tomorrow and INR will be checked at PCP office on 8/28 and the result will be forwarded to coumadin clinic nurse and this nurse will f/u with her and pt concerning warfarin dosing. Advised if any changes to contact coumadin clinic. Jesus [name] verbalized understanding.

## 2023-08-01 ENCOUNTER — Telehealth: Payer: Self-pay | Admitting: Family Medicine

## 2023-08-01 ENCOUNTER — Ambulatory Visit: Payer: Medicare Other

## 2023-08-01 DIAGNOSIS — I110 Hypertensive heart disease with heart failure: Secondary | ICD-10-CM | POA: Diagnosis not present

## 2023-08-01 DIAGNOSIS — I480 Paroxysmal atrial fibrillation: Secondary | ICD-10-CM | POA: Diagnosis not present

## 2023-08-01 DIAGNOSIS — K922 Gastrointestinal hemorrhage, unspecified: Secondary | ICD-10-CM | POA: Diagnosis not present

## 2023-08-01 DIAGNOSIS — I5022 Chronic systolic (congestive) heart failure: Secondary | ICD-10-CM | POA: Diagnosis not present

## 2023-08-01 DIAGNOSIS — D649 Anemia, unspecified: Secondary | ICD-10-CM | POA: Diagnosis not present

## 2023-08-01 NOTE — Telephone Encounter (Signed)
Jesus [name] from [facility] call and stated he need verbal orders for OT once a wk for 4 wk's and also FYI Pt Amiodarone interact with his Coumadin. Jesus [name] 's # is [phone].

## 2023-08-02 ENCOUNTER — Ambulatory Visit: Payer: Self-pay

## 2023-08-02 ENCOUNTER — Encounter: Payer: Self-pay | Admitting: Family Medicine

## 2023-08-02 ENCOUNTER — Ambulatory Visit (INDEPENDENT_AMBULATORY_CARE_PROVIDER_SITE_OTHER): Payer: Medicare Other | Admitting: Family Medicine

## 2023-08-02 VITALS — BP 144/60 | HR 67 | Temp 98.2°F | Ht 61.0 in | Wt 122.4 lb

## 2023-08-02 DIAGNOSIS — R7303 Prediabetes: Secondary | ICD-10-CM

## 2023-08-02 DIAGNOSIS — R531 Weakness: Secondary | ICD-10-CM

## 2023-08-02 DIAGNOSIS — K625 Hemorrhage of anus and rectum: Secondary | ICD-10-CM

## 2023-08-02 DIAGNOSIS — Z7901 Long term (current) use of anticoagulants: Secondary | ICD-10-CM

## 2023-08-02 DIAGNOSIS — R0600 Dyspnea, unspecified: Secondary | ICD-10-CM | POA: Diagnosis not present

## 2023-08-02 DIAGNOSIS — R600 Localized edema: Secondary | ICD-10-CM

## 2023-08-02 DIAGNOSIS — I5022 Chronic systolic (congestive) heart failure: Secondary | ICD-10-CM | POA: Diagnosis not present

## 2023-08-02 DIAGNOSIS — I480 Paroxysmal atrial fibrillation: Secondary | ICD-10-CM

## 2023-08-02 LAB — CBC WITH DIFFERENTIAL/PLATELET
Basophils Absolute: 0.1 10*3/uL (ref 0.0–0.1)
Basophils Relative: 0.9 % (ref 0.0–3.0)
Eosinophils Absolute: 0.1 10*3/uL (ref 0.0–0.7)
Eosinophils Relative: 1.6 % (ref 0.0–5.0)
HCT: 35.6 % — ABNORMAL LOW (ref 39.0–52.0)
Hemoglobin: 11.7 g/dL — ABNORMAL LOW (ref 13.0–17.0)
Lymphocytes Relative: 15.9 % (ref 12.0–46.0)
Lymphs Abs: 0.9 10*3/uL (ref 0.7–4.0)
MCHC: 32.8 g/dL (ref 30.0–36.0)
MCV: 100.6 fl — ABNORMAL HIGH (ref 78.0–100.0)
Monocytes Absolute: 0.8 10*3/uL (ref 0.1–1.0)
Monocytes Relative: 13.6 % — ABNORMAL HIGH (ref 3.0–12.0)
Neutro Abs: 4 10*3/uL (ref 1.4–7.7)
Neutrophils Relative %: 68 % (ref 43.0–77.0)
Platelets: 217 10*3/uL (ref 150.0–400.0)
RBC: 3.54 Mil/uL — ABNORMAL LOW (ref 4.22–5.81)
RDW: 14.3 % (ref 11.5–15.5)
WBC: 5.9 10*3/uL (ref 4.0–10.5)

## 2023-08-02 LAB — BASIC METABOLIC PANEL
BUN: 16 mg/dL (ref 6–23)
CO2: 30 mEq/L (ref 19–32)
Calcium: 8.9 mg/dL (ref 8.4–10.5)
Chloride: 104 mEq/L (ref 96–112)
Creatinine, Ser: 1.03 mg/dL (ref 0.40–1.50)
GFR: 66.72 mL/min (ref >60.00)
Glucose, Bld: 103 mg/dL — ABNORMAL HIGH (ref 70–99)
Potassium: 4.2 mEq/L (ref 3.5–5.1)
Sodium: 139 mEq/L (ref 135–145)

## 2023-08-02 LAB — POCT INR: INR: 3.6 — AB (ref 2.0–3.0)

## 2023-08-02 MED ORDER — FUROSEMIDE 20 MG PO TABS: ORAL_TABLET | ORAL | 3 refills | Status: DC

## 2023-08-02 MED ORDER — METOPROLOL TARTRATE 25 MG PO TABS: 12.5000 mg | ORAL_TABLET | Freq: Two times a day (BID) | ORAL | 3 refills | Status: DC

## 2023-08-02 MED ORDER — ATORVASTATIN CALCIUM 80 MG PO TABS: 80.0000 mg | ORAL_TABLET | Freq: Every day | ORAL | 3 refills | Status: DC

## 2023-08-02 NOTE — Patient Instructions (Addendum)
Pre visit review using our clinic review tool, if applicable. No additional management support is needed unless otherwise documented below in the visit note.  Hold dose today and then change weekly dose to take 1 tablet daily except take 1/2 tablet on Mondays, Wednesdays and Fridays. Recheck in 2 weeks.

## 2023-08-02 NOTE — Progress Notes (Addendum)
Pt in hospital on 8/2 for GI bleed and was d/c into SNF for one week. Pt has been home now.   Pt had OV with PCP today and INR was checked with POCT machine by a CMA. Result was then sent to this nurse.  Hold dose today and then change weekly dose to take 1 tablet daily except take 1/2 tablet on Mondays, Wednesdays and Fridays. Recheck in 2 weeks.  Tried to contact pt but no answer and VM is full.  Contacted pt's sister-in-law and advised of dosing and scheduled pt for next apt. She reports she is going to pt's home later today to go over all of his medication. Advised if any changes to contact coumadin clinic. Lucendia Herrlich verbalized understanding.

## 2023-08-02 NOTE — Progress Notes (Signed)
Established Patient Office Visit  Subjective   Patient ID: Jesus [name], male    DOB: 11/20/1941  Age: 82 y.o. MRN: [id number]  Chief Complaint  Patient presents with   Hospitalization Follow-up    HPI   Jesus [name] is seen today regarding hospital follow-up accompanied by 2 caregivers.  He seems somewhat unsure of recent events and also regarding medications.  His chronic problems include history of systolic heart failure, atrial fibrillation, CAD, ischemic cardiomyopathy, hypertension, history of severe aortic stenosis, GERD, prediabetes, hyperlipidemia.  He had presented to hospital on 2 August with bright red blood per rectum.  Underwent colonoscopy on the sixth and 1 polyp was found in distal rectum and this was removed.  There is also evidence of diverticulosis in the sigmoid colon and descending colon and also internal hemorrhoids.  Coumadin was resumed the next day.  He did have febrile episode on the seventh and chest x-ray and urinalysis were unremarkable.  Blood culture negative.  He also was found on the floor on the sixth after a fall with no obvious injury.  This will be felt to be secondary to weakness and general deconditioning.  He was discharged to [facility] where he stayed until this past Saturday.  Looks like his valsartan was discontinued.  We reviewed his medications.  He is needing refills of metoprolol and atorvastatin.  Still getting home physical therapy 2 times per week.  History of prediabetes range blood sugars.  He had A1c.  During recent hospitalization which was 6.0.  Someone informed him that he had type 2 diabetes but it appears he has more prediabetes.  Went over all of his medications.  He has had some difficulties with Jardiance secondary to cost but is still taking this.  Also remains on metoprolol, amiodarone, Coumadin, atorvastatin, and Zetia.  Caregivers have noticed some increased bilateral leg edema although his home weights are stable.   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 S/P Ant MI 1980;  b. 1997 S/P CABG x 8 (LIMA to diag-LAD, SVG to OM1-OM2-OM3, SVG to [facility] - Dr Andrey Campanile);  c. 01/2015 Cath: 3VD, 8/8 patent grafts.   Cardiomyopathy    Dysrhythmia    Esophageal reflux    GERD (gastroesophageal reflux disease)    Headache    History of colonoscopy    History of transesophageal echocardiography (TEE) for monitoring    Other and unspecified hyperlipidemia    Paroxysmal atrial fibrillation (HCC) 02/20/2022   S/P angioplasty with stent 02/17/22 DES to proximal VG to PDA 02/20/2022   S/P TAVR (transcatheter aortic valve replacement)    a. 03/2015 26 mm Edwards Sapien 3 transcatheter heart valve placed via open left transfemoral approach.   Severe aortic stenosis 08/10/2012   Skin lesions, generalized    facial which may represent actinic keratoses and possible photosensitivity from Amiodarone   Unspecified essential hypertension    Past Surgical History:  Procedure Laterality Date   CARDIAC CATHETERIZATION     CARDIOVERSION  11/18/2006   Dr. Jacklynn Bue   CATARACT EXTRACTION W/ INTRAOCULAR LENS IMPLANT  April '13  (Dr. Dagoberto Ligas)  left eye only   CHOLECYSTECTOMY     COLONOSCOPY WITH PROPOFOL N/A 07/11/2023   Procedure: COLONOSCOPY WITH PROPOFOL;  Surgeon: [name], MD;  Location: [facility] ENDOSCOPY;  Service: Gastroenterology;  Laterality: N/A;   CORONARY ARTERY BYPASS GRAFT  02/09/1996   LIMA to diag-LAD, SVG to OM1-OM2-OM3, SVG to [facility]   CORONARY STENT INTERVENTION N/A 02/17/2022   Procedure: CORONARY STENT INTERVENTION;  Surgeon: Kathleene Hazel, MD;  Location: [facility]  INVASIVE CV LAB;  Service: Cardiovascular;  Laterality: N/A;   CORONARY/GRAFT ANGIOGRAPHY N/A 02/17/2022   Procedure: CORONARY/GRAFT ANGIOGRAPHY;  Surgeon: Kathleene Hazel, MD;  Location: [facility] INVASIVE CV LAB;  Service: Cardiovascular;  Laterality: N/A;   EYE SURGERY     HEMOSTASIS CLIP PLACEMENT  07/11/2023   Procedure: HEMOSTASIS CLIP PLACEMENT;  Surgeon: [name], MD;  Location: [facility] ENDOSCOPY;  Service: Gastroenterology;;   HEMOSTASIS CONTROL  07/11/2023   Procedure: HEMOSTASIS CONTROL;  Surgeon: [name], MD;  Location: [facility] ENDOSCOPY;  Service: Gastroenterology;;   LEFT AND RIGHT HEART CATHETERIZATION WITH CORONARY/GRAFT ANGIOGRAM N/A 01/26/2015   Procedure: LEFT AND RIGHT HEART CATHETERIZATION WITH Isabel Caprice;  Surgeon: Micheline Chapman, MD;  Location: [facility] CATH LAB;  Service: Cardiovascular;  Laterality: N/A;   POLYPECTOMY  07/11/2023   Procedure: POLYPECTOMY;  Surgeon: [name], MD;  Location: [facility] ENDOSCOPY;  Service: Gastroenterology;;   TEE WITHOUT CARDIOVERSION N/A 03/10/2015   Procedure: TRANSESOPHAGEAL ECHOCARDIOGRAM (TEE);  Surgeon: [name], MD;  Location: [facility] OR;  Service: Open Heart Surgery;  Laterality: N/A;   TONSILLECTOMY     TRANSCATHETER AORTIC VALVE REPLACEMENT, TRANSFEMORAL N/A 03/10/2015   Procedure: TRANSCATHETER AORTIC VALVE REPLACEMENT, TRANSFEMORAL;  Surgeon: [name], MD;  Location: [facility] OR;  Service: Open Heart Surgery;  Laterality: N/A;    reports that he has never smoked. He has never used smokeless tobacco. He reports that he does not drink alcohol and does not use drugs. family history includes Atrial fibrillation in his brother; Crohn's disease in his mother; Heart attack (age of onset: 64) in his father; Heart disease in his father and paternal uncle; Heart failure in his father; Prostate cancer in his paternal uncle; Stroke (age of onset: 44) in his mother. Allergies  Allergen Reactions   Amlodipine Other (See Comments)     Swelling?   Codeine Other (See Comments)    Pt does not remember   Iodine Swelling    Review of Systems  Eyes:  Negative for blurred vision.  Respiratory:  Negative for shortness of breath.   Cardiovascular:  Negative for chest pain.  Neurological:  Negative for dizziness, weakness and headaches.      Objective:     BP (!) 144/60 (BP Location: Left Arm, Patient Position: Sitting, Cuff Size: Normal)   Pulse 67   Temp 98.2 F (36.8 C) (Oral)   Ht 5\' 1"  (1.549 m)   Wt 122 lb 6.4 oz (55.5 kg)   SpO2 98%   BMI 23.13 kg/m  BP Readings from Last 3 Encounters:  08/02/23 (!) 144/60  07/16/23 (!) 97/58  07/04/23 (!) 140/58   Wt Readings from Last 3 Encounters:  08/02/23 122 lb 6.4 oz (55.5 kg)  07/14/23 118 lb 6.2 oz (53.7 kg)  07/04/23 121 lb (54.9 kg)      Physical Exam Constitutional:      Appearance: He is well-developed.  HENT:     Right Ear: External ear normal.     Left Ear: External ear normal.  Eyes:  Pupils: Pupils are equal, round, and reactive to light.  Neck:     Thyroid: No thyromegaly.  Cardiovascular:     Rate and Rhythm: Normal rate and regular rhythm.  Pulmonary:     Effort: Pulmonary effort is normal.     Breath sounds: Rales present.     Comments: Does have some rales in both bases but no increased work of breathing at rest.  Pulse oximetry 98% at rest Musculoskeletal:     Cervical back: Neck supple.     Right lower leg: Edema present.     Left lower leg: Edema present.     Comments: 2+ pitting edema lower legs bilaterally  Neurological:     Mental Status: He is alert and oriented to person, place, and time.      Results for orders placed or performed in visit on 08/02/23  POC INR  Result Value Ref Range   INR 3.6 (A) 2.0 - 3.0   POC INR      Last CBC Lab Results  Component Value Date   WBC 5.8 07/16/2023   HGB 12.2 (L) 07/16/2023   HCT 37.1 (L) 07/16/2023   MCV 98.9 07/16/2023   MCH 32.5 07/16/2023   RDW 13.5 07/16/2023    PLT 93 (L) 07/16/2023   Last metabolic panel Lab Results  Component Value Date   GLUCOSE 96 07/16/2023   NA 137 07/16/2023   K 3.9 07/16/2023   CL 105 07/16/2023   CO2 24 07/16/2023   BUN 26 (H) 07/16/2023   CREATININE 1.29 (H) 07/16/2023   GFRNONAA 55 (L) 07/16/2023   CALCIUM 8.1 (L) 07/16/2023   PHOS 4.1 07/09/2023   PROT 6.0 (L) 07/09/2023   ALBUMIN 3.2 (L) 07/09/2023   LABGLOB 2.4 06/05/2023   AGRATIO 1.4 10/19/2022   BILITOT 1.7 (H) 07/09/2023   ALKPHOS 92 07/09/2023   AST 30 07/09/2023   ALT 28 07/09/2023   ANIONGAP 8 07/16/2023   Last lipids Lab Results  Component Value Date   CHOL 135 06/05/2023   HDL 50 06/05/2023   LDLCALC 68 06/05/2023   TRIG 90 06/05/2023   CHOLHDL 2.7 06/05/2023   Last hemoglobin A1c Lab Results  Component Value Date   HGBA1C 6.0 (H) 07/08/2023   Last thyroid functions Lab Results  Component Value Date   TSH 5.030 (H) 06/05/2023   T4TOTAL 8.5 10/19/2009      The ASCVD Risk score (Arnett DK, et al., 2019) failed to calculate for the following reasons:   The 2019 ASCVD risk score is only valid for ages 27 to 58   The patient has a prior MI or stroke diagnosis    Assessment & Plan:   #1 chronic systolic heart failure.  Patient appears to be somewhat volume overloaded today though his weight is stable by home readings.  He has significant pitting edema in the legs since does have some rales at lung bases bilaterally but no respiratory distress at rest. -Elevate legs frequently -Start back at least short-term furosemide 20 mg daily for the next 3 or 4 days and then reassess.  If edema is improving at that point discontinue Lasix but use as needed -Also reiterated the importance of trying to keep daily sodium intake less than 2000 mg -Refill metoprolol  #2 history of prediabetes range blood sugars.  Recent A1c 6.0%.  Avoid high glycemic foods.  Recommend monitoring every 6 months at least  #3 generalized weakness and  deconditioning.  He had rehab as above.  Home PT 2 times per week.  Continue with home exercises.  #4 recent episode of bright red blood per rectum in a patient on Coumadin.  Colonoscopy issues as above.  No concerning lesions noted.  Recheck CBC.  He is not have any current episodes of bright red blood per rectum.  #5 chronic Coumadin therapy.  Recheck INR today which is 3.6.  This will be forwarded to our Coumadin care nurse  40 minutes were spent reviewing recent hospital events, medications, current symptoms, home health services, refilling medications, diet, and anticipatory guidance  Return in about 3 months (around 11/02/2023).    Evelena Peat, MD

## 2023-08-02 NOTE — Progress Notes (Signed)
I have reviewed and agree with note, evaluation, plan.   Stephen Hunter, MD  

## 2023-08-02 NOTE — Telephone Encounter (Signed)
Jeannett Senior informed of ok for verbal orders

## 2023-08-03 ENCOUNTER — Ambulatory Visit (INDEPENDENT_AMBULATORY_CARE_PROVIDER_SITE_OTHER): Payer: Medicare Other

## 2023-08-03 VITALS — Ht 61.0 in | Wt 120.0 lb

## 2023-08-03 DIAGNOSIS — K922 Gastrointestinal hemorrhage, unspecified: Secondary | ICD-10-CM | POA: Diagnosis not present

## 2023-08-03 DIAGNOSIS — I480 Paroxysmal atrial fibrillation: Secondary | ICD-10-CM | POA: Diagnosis not present

## 2023-08-03 DIAGNOSIS — I110 Hypertensive heart disease with heart failure: Secondary | ICD-10-CM | POA: Diagnosis not present

## 2023-08-03 DIAGNOSIS — Z0000 Encounter for general adult medical examination without abnormal findings: Secondary | ICD-10-CM

## 2023-08-03 DIAGNOSIS — D649 Anemia, unspecified: Secondary | ICD-10-CM | POA: Diagnosis not present

## 2023-08-03 DIAGNOSIS — I5022 Chronic systolic (congestive) heart failure: Secondary | ICD-10-CM | POA: Diagnosis not present

## 2023-08-03 NOTE — Progress Notes (Signed)
Subjective:   Jesus [name] is a 82 y.o. male who presents for Medicare Annual/Subsequent preventive examination.  Visit Complete: Virtual  I connected with  Jesus [name] on 08/03/23 by a audio enabled telemedicine application and verified that I am speaking with the correct person using two identifiers.  Patient Location: Home  Provider Location: Home Office  I discussed the limitations of evaluation and management by telemedicine. The patient expressed understanding and agreed to proceed.   Review of Systems   Vital Signs: Unable to obtain new vitals due to this being a telehealth visit.  Cardiac Risk Factors include: advanced age (>49men, >64 women);male gender;hypertension     Objective:    Today's Vitals   08/03/23 1451 08/03/23 1453  Weight: 120 lb (54.4 kg)   Height: 5\' 1"  (1.549 m)   PainSc:  0-No pain   Body mass index is 22.67 kg/m.     08/03/2023    3:02 PM 07/08/2023    1:38 AM 07/07/2023    5:26 PM 07/28/2022    3:06 PM 03/08/2022    3:37 PM 02/27/2022    6:10 PM 02/27/2022    4:18 PM  Advanced Directives  Does Patient Have a Medical Advance Directive? Yes  No Yes Yes Yes   Type of Estate agent of [place];Living will   Healthcare Power of [place];Living will Healthcare Power of [place];Living will Healthcare Power of [place];Living will Living will;Healthcare Power of Attorney  Does patient want to make changes to medical advance directive?    No - Patient declined No - Patient declined    Copy of Healthcare Power of Attorney in Chart? No - copy requested   No - copy requested No - copy requested    Would patient like information on creating a medical advance directive?  No - Patient declined         Current Medications (verified) Outpatient Encounter Medications as of 08/03/2023  Medication Sig   acetaminophen (TYLENOL) 325 MG tablet Take 1-2 tablets (325-650 mg total) by mouth every 4 (four) hours as needed for mild pain.    amiodarone (PACERONE) 200 MG tablet Take 0.5 tablets (100 mg total) by mouth daily.   amoxicillin (AMOXIL) 500 MG capsule TAKE FOUR CAPSULES BY MOUTH ONE hour prior TO dental procedure   Ascorbic Acid (VITAMIN C) 1000 MG tablet Take 1,000 mg by mouth daily.   aspirin EC 81 MG tablet Take 1 tablet (81 mg total) by mouth daily. Swallow whole.   atorvastatin (LIPITOR) 80 MG tablet Take 1 tablet (80 mg total) by mouth daily.   ezetimibe (ZETIA) 10 MG tablet Take 1 tablet (10 mg total) by mouth daily.   furosemide (LASIX) 20 MG tablet Take one tablet by mouth daily as needed for increased leg edema   guaiFENesin-dextromethorphan (ROBITUSSIN DM) 100-10 MG/5ML syrup Take 10 mLs by mouth every 4 (four) hours as needed for cough.   JARDIANCE 10 MG TABS tablet TAKE 1 TABLET BY MOUTH EVERY DAY   metoprolol tartrate (LOPRESSOR) 25 MG tablet Take 0.5 tablets (12.5 mg total) by mouth 2 (two) times daily.   Multiple Vitamins-Minerals (MULTIVITAMIN,TX-MINERALS) tablet Take 1 tablet by mouth daily.   nitroGLYCERIN (NITROSTAT) 0.4 MG SL tablet Place 1 tablet (0.4 mg total) under the tongue every 5 (five) minutes x 3 doses as needed for chest pain.   pantoprazole (PROTONIX) 40 MG tablet Take 1 tablet (40 mg total) by mouth daily.   warfarin (COUMADIN) 4 MG tablet TAKE 1  TABLET BY MOUTH DAILY EXCEPT TAKE 1/2 TABLET ON MONDAYS AND FRIDAYS OR AS DIRECTED BY ANTICOAGULATION CLINIC   No facility-administered encounter medications on file as of 08/03/2023.    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 S/P Ant MI 1980;  b. 1997 S/P CABG x 8 (LIMA to diag-LAD, SVG to OM1-OM2-OM3, SVG to [facility] - Dr Andrey Campanile);  c. 01/2015  Cath: 3VD, 8/8 patent grafts.   Cardiomyopathy    Dysrhythmia    Esophageal reflux    GERD (gastroesophageal reflux disease)    Headache    History of colonoscopy    History of transesophageal echocardiography (TEE) for monitoring    Other and unspecified hyperlipidemia    Paroxysmal atrial fibrillation (HCC) 02/20/2022   S/P angioplasty with stent 02/17/22 DES to proximal VG to PDA 02/20/2022   S/P TAVR (transcatheter aortic valve replacement)    a. 03/2015 26 mm Edwards Sapien 3 transcatheter heart valve placed via open left transfemoral approach.   Severe aortic stenosis 08/10/2012   Skin lesions, generalized    facial which may represent actinic keratoses and possible photosensitivity from Amiodarone   Unspecified essential hypertension    Past Surgical History:  Procedure Laterality Date   CARDIAC CATHETERIZATION     CARDIOVERSION  11/18/2006   Dr. Jacklynn Bue   CATARACT EXTRACTION W/ INTRAOCULAR LENS IMPLANT  April '13  (Dr. Dagoberto Ligas)   left eye only   CHOLECYSTECTOMY     COLONOSCOPY WITH PROPOFOL N/A 07/11/2023   Procedure: COLONOSCOPY WITH PROPOFOL;  Surgeon: [name], MD;  Location: [facility] ENDOSCOPY;  Service: Gastroenterology;  Laterality: N/A;   CORONARY ARTERY BYPASS GRAFT  02/09/1996   LIMA to diag-LAD, SVG to OM1-OM2-OM3, SVG to [facility]   CORONARY STENT INTERVENTION N/A 02/17/2022   Procedure: CORONARY STENT INTERVENTION;  Surgeon: Kathleene Hazel, MD;  Location: [facility] INVASIVE CV LAB;  Service: Cardiovascular;  Laterality: N/A;   CORONARY/GRAFT ANGIOGRAPHY N/A 02/17/2022   Procedure: CORONARY/GRAFT ANGIOGRAPHY;  Surgeon: Kathleene Hazel, MD;  Location: [facility] INVASIVE CV LAB;  Service: Cardiovascular;  Laterality: N/A;   EYE SURGERY     HEMOSTASIS CLIP PLACEMENT  07/11/2023   Procedure: HEMOSTASIS CLIP PLACEMENT;  Surgeon: [name], MD;  Location: [facility] ENDOSCOPY;  Service: Gastroenterology;;   HEMOSTASIS CONTROL  07/11/2023   Procedure: HEMOSTASIS CONTROL;   Surgeon: [name], MD;  Location: [facility] ENDOSCOPY;  Service: Gastroenterology;;   LEFT AND RIGHT HEART CATHETERIZATION WITH CORONARY/GRAFT ANGIOGRAM N/A 01/26/2015   Procedure: LEFT AND RIGHT HEART CATHETERIZATION WITH Isabel Caprice;  Surgeon: Micheline Chapman, MD;  Location: [facility] CATH LAB;  Service: Cardiovascular;  Laterality: N/A;   POLYPECTOMY  07/11/2023   Procedure: POLYPECTOMY;  Surgeon: [name], MD;  Location: [facility] ENDOSCOPY;  Service: Gastroenterology;;   TEE WITHOUT CARDIOVERSION N/A 03/10/2015   Procedure: TRANSESOPHAGEAL ECHOCARDIOGRAM (TEE);  Surgeon: [name], MD;  Location: [facility] OR;  Service: Open Heart Surgery;  Laterality: N/A;   TONSILLECTOMY     TRANSCATHETER AORTIC VALVE REPLACEMENT, TRANSFEMORAL N/A 03/10/2015   Procedure: TRANSCATHETER AORTIC VALVE REPLACEMENT, TRANSFEMORAL;  Surgeon: [name], MD;  Location: [facility] OR;  Service: Open Heart Surgery;  Laterality: N/A;  Family History  Problem Relation Age of Onset   Stroke Mother 44       Deceased   Crohn's disease Mother        Deceased   Heart attack Father 45       Deceased   Heart disease Father    Heart failure Father        Deceased   Atrial fibrillation Brother    Heart disease Paternal Uncle    Prostate cancer Paternal Uncle    Colon cancer Neg Hx    Social History   Socioeconomic History   Marital status: Married    Spouse name: Not on file   Number of children: Not on file   Years of education: Not on file   Highest education level: Not on file  Occupational History   Not on file  Tobacco Use   Smoking status: Never   Smokeless tobacco: Never   Tobacco comments:    Does not smoke.  Vaping Use   Vaping status: Never Used  Substance and Sexual Activity   Alcohol use: No    Alcohol/week: 0.0 standard drinks of alcohol   Drug use: No   Sexual activity: Not Currently  Other Topics Concern   Not on file  Social History Narrative   HSG. [employer] - [employer] 6 years.  Married - '69 - 1 year/divorced. '76 - . No children. Work - mfg/textiles - Audiological scientist; currently works doing maintenance. [employer] - they have discussed this. Provided packet august '13.                Social Determinants of Health   Financial Resource Strain: Low Risk  (08/03/2023)   Overall Financial Resource Strain (CARDIA)    Difficulty of Paying Living Expenses: Not hard at all  Food Insecurity: No Food Insecurity (08/03/2023)   Hunger Vital Sign    Worried About Running Out of Food in the Last Year: Never true    Ran Out of Food in the Last Year: Never true  Transportation Needs: No Transportation Needs (08/03/2023)   PRAPARE - Administrator, Civil Service (Medical): No    Lack of Transportation (Non-Medical): No  Physical Activity: Sufficiently Active (08/03/2023)   Exercise Vital Sign    Days of Exercise per Week: 5 days    Minutes of Exercise per Session: 60 min  Stress: No Stress Concern Present (08/03/2023)   [employer] of Occupational Health - Occupational Stress Questionnaire    Feeling of Stress : Not at all  Social Connections: Socially Integrated (08/03/2023)   Social Connection and Isolation Panel [NHANES]    Frequency of Communication with Friends and Family: More than three times a week    Frequency of Social Gatherings with Friends and Family: More than three times a week    Attends Religious Services: More than 4 times per year    Active Member of [employer] or Organizations: Yes    Attends Engineer, structural: More than 4 times per year    Marital Status: Married    Tobacco Counseling Counseling given: Not Answered Tobacco comments: Does not smoke.   Clinical Intake:  Pre-visit preparation completed: Yes  Pain : No/denies pain Pain Score: 0-No pain     BMI - recorded: 22.67 Nutritional Status: BMI of 19-24  Normal Nutritional Risks: None Diabetes: No  How often do you need to have someone help you when you read  instructions, pamphlets, or other written materials from your doctor  or pharmacy?: 1 - Never  Interpreter Needed?: No  Information entered by :: Theresa Mulligan LPN   Activities of Daily Living    08/03/2023    3:00 PM 07/08/2023    1:00 AM  In your present state of health, do you have any difficulty performing the following activities:  Hearing? 0 1  Vision? 0 0  Difficulty concentrating or making decisions? 0 0  Walking or climbing stairs? 1 0  Comment Uses a walker   Dressing or bathing? 0 0  Doing errands, shopping? 0 0  Preparing Food and eating ? N   Using the Toilet? N   In the past six months, have you accidently leaked urine? N   Do you have problems with loss of bowel control? N   Managing your Medications? N   Managing your Finances? N   Housekeeping or managing your Housekeeping? N     Patient Care Team: Kristian Covey, MD as PCP - General (Family Medicine) [name], MD as PCP - Cardiology (Cardiology) Duke Salvia, MD as PCP - Electrophysiology (Cardiology) [place], Milas Kocher, [facility] (Inactive) (Pharmacist)  Indicate any recent Medical Services you may have received from other than [facility] providers in the past year (date may be approximate).     Assessment:   This is a routine wellness examination for Jesus [name].  Hearing/Vision screen Hearing Screening - Comments:: Denies hearing difficulties   Vision Screening - Comments:: Wears rx glasses - up to date with routine eye exams with  [facility]  Dietary issues and exercise activities discussed:     Goals Addressed               This Visit's Progress     Get back to working (pt-stated)         Depression Screen    08/03/2023    2:59 PM 09/07/2022    2:24 PM 09/02/2022   11:51 AM 07/28/2022    3:02 PM 02/23/2022    4:53 PM 06/21/2021    1:20 PM 06/21/2021    1:14 PM  PHQ 2/9 Scores  PHQ - 2 Score 0 0 0 0 0 0 0  PHQ- 9 Score  0 0  0      Fall Risk    08/03/2023    3:01 PM 09/07/2022     2:24 PM 09/02/2022   11:51 AM 07/28/2022    3:05 PM 02/23/2022    4:53 PM  Fall Risk   Falls in the past year? 1 0 0 0 0  Number falls in past yr: 0 0 0 0 0  Injury with Fall? 0 0 0 0 0  Risk for fall due to : No Fall Risks No Fall Risks No Fall Risks No Fall Risks No Fall Risks  Follow up Falls prevention discussed Falls evaluation completed Falls evaluation completed  Falls evaluation completed    MEDICARE RISK AT HOME: Medicare Risk at Home Any stairs in or around the home?: Yes If so, are there any without handrails?: No Home free of loose throw rugs in walkways, pet beds, electrical cords, etc?: Yes Adequate lighting in your home to reduce risk of falls?: Yes Life alert?: No Use of a cane, walker or w/c?: Yes Grab bars in the bathroom?: Yes Shower chair or bench in shower?: Yes Elevated toilet seat or a handicapped toilet?: No  TIMED UP AND GO:  Was the test performed?  No    Cognitive Function:  08/03/2023    3:03 PM 07/28/2022    3:06 PM  6CIT Screen  What Year? 0 points 0 points  What month? 0 points 0 points  What time? 0 points 0 points  Count back from 20 0 points 0 points  Months in reverse 2 points 0 points  Repeat phrase 0 points 0 points  Total Score 2 points 0 points    Immunizations Immunization History  Administered Date(s) Administered   Covid-19, Mrna,Vaccine(Spikevax)31yrs and older 09/08/2022   Fluad Quad(high Dose 65+) 10/04/2019, 10/08/2020, 11/01/2022   Influenza Whole 09/05/2001, 09/26/2008, 09/26/2009, 09/14/2010   Influenza, High Dose Seasonal PF 08/12/2015, 09/02/2016, 08/25/2017, 09/07/2018, 08/24/2021   Influenza,inj,Quad PF,6+ Mos 08/27/2014   Influenza-Unspecified 09/12/2012, 08/16/2013   PFIZER(Purple Top)SARS-COV-2 Vaccination 01/06/2020, 01/20/2020, 11/23/2020   Pneumococcal Conjugate-13 04/07/2014   Pneumococcal Polysaccharide-23 09/05/2001, 01/14/2010   Td 01/06/1999   Tetanus 05/15/2013   Zoster  Recombinant(Shingrix) 09/06/2019, 02/19/2020   Zoster, Live 12/14/2011    TDAP status: Due, Education has been provided regarding the importance of this vaccine. Advised may receive this vaccine at local pharmacy or Health Dept. Aware to provide a copy of the vaccination record if obtained from local pharmacy or Health Dept. Verbalized acceptance and understanding.  Flu Vaccine status: Due, Education has been provided regarding the importance of this vaccine. Advised may receive this vaccine at local pharmacy or Health Dept. Aware to provide a copy of the vaccination record if obtained from local pharmacy or Health Dept. Verbalized acceptance and understanding.  Pneumococcal vaccine status: Up to date  Covid-19 vaccine status: Declined, Education has been provided regarding the importance of this vaccine but patient still declined. Advised may receive this vaccine at local pharmacy or Health Dept.or vaccine clinic. Aware to provide a copy of the vaccination record if obtained from local pharmacy or Health Dept. Verbalized acceptance and understanding.  Qualifies for Shingles Vaccine? Yes   Zostavax completed Yes   Shingrix Completed?: Yes  Screening Tests Health Maintenance  Topic Date Due   COVID-19 Vaccine (5 - 2023-24 season) 11/03/2022   DTaP/Tdap/Td (3 - Tdap) 05/16/2023   INFLUENZA VACCINE  07/06/2023   Medicare Annual Wellness (AWV)  08/02/2024   Pneumonia Vaccine 88+ Years old  Completed   Zoster Vaccines- Shingrix  Completed   HPV VACCINES  Aged Out    Health Maintenance  Health Maintenance Due  Topic Date Due   COVID-19 Vaccine (5 - 2023-24 season) 11/03/2022   DTaP/Tdap/Td (3 - Tdap) 05/16/2023   INFLUENZA VACCINE  07/06/2023       Additional Screening:    Vision Screening: Recommended annual ophthalmology exams for early detection of glaucoma and other disorders of the eye. Is the patient up to date with their annual eye exam?  Yes  Who is the provider or  what is the name of the office in which the patient attends annual eye exams? [facility] If pt is not established with a provider, would they like to be referred to a provider to establish care? No .   Dental Screening: Recommended annual dental exams for proper oral hygiene    Community Resource Referral / Chronic Care Management:  CRR required this visit?  No   CCM required this visit?  No     Plan:     I have personally reviewed and noted the following in the patient's chart:   Medical and social history Use of alcohol, tobacco or illicit drugs  Current medications and supplements including opioid prescriptions. Patient is  not currently taking opioid prescriptions. Functional ability and status Nutritional status Physical activity Advanced directives List of other physicians Hospitalizations, surgeries, and ER visits in previous 12 months Vitals Screenings to include cognitive, depression, and falls Referrals and appointments  In addition, I have reviewed and discussed with patient certain preventive protocols, quality metrics, and best practice recommendations. A written personalized care plan for preventive services as well as general preventive health recommendations were provided to patient.     Tillie Rung, LPN   [id number]   After Visit Summary: (MyChart) Due to this being a telephonic visit, the after visit summary with patients personalized plan was offered to patient via MyChart   Nurse Notes: None

## 2023-08-03 NOTE — Patient Instructions (Addendum)
Jesus [name] , Thank you for taking time to come for your Medicare Wellness Visit. I appreciate your ongoing commitment to your health goals. Please review the following plan we discussed and let me know if I can assist you in the future.   Referrals/Orders/Follow-Ups/Clinician Recommendations:   This is a list of the screening recommended for you and due dates:  Health Maintenance  Topic Date Due   COVID-19 Vaccine (5 - 2023-24 season) 11/03/2022   DTaP/Tdap/Td vaccine (3 - Tdap) 05/16/2023   Flu Shot  07/06/2023   Medicare Annual Wellness Visit  08/02/2024   Pneumonia Vaccine  Completed   Zoster (Shingles) Vaccine  Completed   HPV Vaccine  Aged Out    Advanced directives: (Copy Requested) Please bring a copy of your health care power of attorney and living will to the office to be added to your chart at your convenience.  Next Medicare Annual Wellness Visit scheduled for next year: Yes

## 2023-08-09 DIAGNOSIS — K922 Gastrointestinal hemorrhage, unspecified: Secondary | ICD-10-CM | POA: Diagnosis not present

## 2023-08-09 DIAGNOSIS — I110 Hypertensive heart disease with heart failure: Secondary | ICD-10-CM | POA: Diagnosis not present

## 2023-08-09 DIAGNOSIS — D649 Anemia, unspecified: Secondary | ICD-10-CM | POA: Diagnosis not present

## 2023-08-09 DIAGNOSIS — I5022 Chronic systolic (congestive) heart failure: Secondary | ICD-10-CM | POA: Diagnosis not present

## 2023-08-09 DIAGNOSIS — I480 Paroxysmal atrial fibrillation: Secondary | ICD-10-CM | POA: Diagnosis not present

## 2023-08-10 ENCOUNTER — Other Ambulatory Visit: Payer: Self-pay | Admitting: Family Medicine

## 2023-08-10 ENCOUNTER — Inpatient Hospital Stay (HOSPITAL_COMMUNITY)
Admission: EM | Admit: 2023-08-10 | Discharge: 2023-08-14 | DRG: 291 | Disposition: A | Discharge status: Home Health | Payer: Medicare Other | Attending: Internal Medicine | Admitting: Internal Medicine

## 2023-08-10 ENCOUNTER — Other Ambulatory Visit: Payer: Self-pay

## 2023-08-10 DIAGNOSIS — Z952 Presence of prosthetic heart valve: Secondary | ICD-10-CM | POA: Diagnosis not present

## 2023-08-10 DIAGNOSIS — Z91041 Radiographic dye allergy status: Secondary | ICD-10-CM

## 2023-08-10 DIAGNOSIS — Z885 Allergy status to narcotic agent status: Secondary | ICD-10-CM

## 2023-08-10 DIAGNOSIS — Z79899 Other long term (current) drug therapy: Secondary | ICD-10-CM

## 2023-08-10 DIAGNOSIS — E0590 Thyrotoxicosis, unspecified without thyrotoxic crisis or storm: Secondary | ICD-10-CM | POA: Diagnosis not present

## 2023-08-10 DIAGNOSIS — I5021 Acute systolic (congestive) heart failure: Secondary | ICD-10-CM | POA: Diagnosis not present

## 2023-08-10 DIAGNOSIS — I10 Essential (primary) hypertension: Secondary | ICD-10-CM | POA: Diagnosis not present

## 2023-08-10 DIAGNOSIS — R0602 Shortness of breath: Secondary | ICD-10-CM | POA: Diagnosis not present

## 2023-08-10 DIAGNOSIS — D508 Other iron deficiency anemias: Secondary | ICD-10-CM | POA: Diagnosis not present

## 2023-08-10 DIAGNOSIS — N179 Acute kidney failure, unspecified: Secondary | ICD-10-CM | POA: Diagnosis not present

## 2023-08-10 DIAGNOSIS — Z8249 Family history of ischemic heart disease and other diseases of the circulatory system: Secondary | ICD-10-CM | POA: Diagnosis not present

## 2023-08-10 DIAGNOSIS — Z888 Allergy status to other drugs, medicaments and biological substances status: Secondary | ICD-10-CM

## 2023-08-10 DIAGNOSIS — N182 Chronic kidney disease, stage 2 (mild): Secondary | ICD-10-CM | POA: Diagnosis not present

## 2023-08-10 DIAGNOSIS — D509 Iron deficiency anemia, unspecified: Secondary | ICD-10-CM | POA: Diagnosis not present

## 2023-08-10 DIAGNOSIS — Z951 Presence of aortocoronary bypass graft: Secondary | ICD-10-CM | POA: Diagnosis not present

## 2023-08-10 DIAGNOSIS — K219 Gastro-esophageal reflux disease without esophagitis: Secondary | ICD-10-CM | POA: Diagnosis not present

## 2023-08-10 DIAGNOSIS — I25810 Atherosclerosis of coronary artery bypass graft(s) without angina pectoris: Secondary | ICD-10-CM | POA: Diagnosis not present

## 2023-08-10 DIAGNOSIS — I252 Old myocardial infarction: Secondary | ICD-10-CM | POA: Diagnosis not present

## 2023-08-10 DIAGNOSIS — Z7901 Long term (current) use of anticoagulants: Secondary | ICD-10-CM

## 2023-08-10 DIAGNOSIS — Z7984 Long term (current) use of oral hypoglycemic drugs: Secondary | ICD-10-CM | POA: Diagnosis not present

## 2023-08-10 DIAGNOSIS — E876 Hypokalemia: Secondary | ICD-10-CM | POA: Diagnosis not present

## 2023-08-10 DIAGNOSIS — Z515 Encounter for palliative care: Secondary | ICD-10-CM | POA: Diagnosis not present

## 2023-08-10 DIAGNOSIS — E782 Mixed hyperlipidemia: Secondary | ICD-10-CM | POA: Diagnosis present

## 2023-08-10 DIAGNOSIS — I4891 Unspecified atrial fibrillation: Secondary | ICD-10-CM | POA: Diagnosis not present

## 2023-08-10 DIAGNOSIS — I700 Atherosclerosis of aorta: Secondary | ICD-10-CM | POA: Diagnosis not present

## 2023-08-10 DIAGNOSIS — J90 Pleural effusion, not elsewhere classified: Secondary | ICD-10-CM | POA: Diagnosis not present

## 2023-08-10 DIAGNOSIS — I480 Paroxysmal atrial fibrillation: Secondary | ICD-10-CM | POA: Diagnosis not present

## 2023-08-10 DIAGNOSIS — I482 Chronic atrial fibrillation, unspecified: Secondary | ICD-10-CM | POA: Diagnosis not present

## 2023-08-10 DIAGNOSIS — Z7982 Long term (current) use of aspirin: Secondary | ICD-10-CM | POA: Diagnosis not present

## 2023-08-10 DIAGNOSIS — N1831 Chronic kidney disease, stage 3a: Secondary | ICD-10-CM | POA: Diagnosis present

## 2023-08-10 DIAGNOSIS — I130 Hypertensive heart and chronic kidney disease with heart failure and stage 1 through stage 4 chronic kidney disease, or unspecified chronic kidney disease: Secondary | ICD-10-CM | POA: Diagnosis not present

## 2023-08-10 DIAGNOSIS — I5023 Acute on chronic systolic (congestive) heart failure: Secondary | ICD-10-CM | POA: Diagnosis not present

## 2023-08-10 DIAGNOSIS — I5043 Acute on chronic combined systolic (congestive) and diastolic (congestive) heart failure: Secondary | ICD-10-CM | POA: Diagnosis not present

## 2023-08-10 DIAGNOSIS — I2510 Atherosclerotic heart disease of native coronary artery without angina pectoris: Secondary | ICD-10-CM | POA: Diagnosis not present

## 2023-08-10 DIAGNOSIS — Z953 Presence of xenogenic heart valve: Secondary | ICD-10-CM

## 2023-08-10 DIAGNOSIS — Z66 Do not resuscitate: Secondary | ICD-10-CM | POA: Diagnosis not present

## 2023-08-10 DIAGNOSIS — Z823 Family history of stroke: Secondary | ICD-10-CM

## 2023-08-10 DIAGNOSIS — I4819 Other persistent atrial fibrillation: Secondary | ICD-10-CM | POA: Diagnosis present

## 2023-08-10 DIAGNOSIS — J9811 Atelectasis: Secondary | ICD-10-CM | POA: Diagnosis not present

## 2023-08-10 DIAGNOSIS — I110 Hypertensive heart disease with heart failure: Secondary | ICD-10-CM | POA: Diagnosis not present

## 2023-08-10 DIAGNOSIS — Z7189 Other specified counseling: Secondary | ICD-10-CM | POA: Diagnosis not present

## 2023-08-10 DIAGNOSIS — R911 Solitary pulmonary nodule: Secondary | ICD-10-CM

## 2023-08-10 DIAGNOSIS — I5022 Chronic systolic (congestive) heart failure: Secondary | ICD-10-CM | POA: Diagnosis present

## 2023-08-10 NOTE — Progress Notes (Signed)
Have ordered repeat CT chest in 4 months to re-evaluate 1.3 cm LLL nodule.    Kristian Covey MD [facility]

## 2023-08-11 ENCOUNTER — Other Ambulatory Visit (HOSPITAL_COMMUNITY): Payer: Self-pay

## 2023-08-11 ENCOUNTER — Other Ambulatory Visit: Payer: Self-pay

## 2023-08-11 ENCOUNTER — Emergency Department (HOSPITAL_COMMUNITY): Payer: Medicare Other

## 2023-08-11 ENCOUNTER — Encounter (HOSPITAL_COMMUNITY): Payer: Self-pay | Admitting: Emergency Medicine

## 2023-08-11 DIAGNOSIS — I5023 Acute on chronic systolic (congestive) heart failure: Secondary | ICD-10-CM | POA: Diagnosis not present

## 2023-08-11 DIAGNOSIS — J9811 Atelectasis: Secondary | ICD-10-CM | POA: Diagnosis not present

## 2023-08-11 DIAGNOSIS — Z7189 Other specified counseling: Secondary | ICD-10-CM | POA: Diagnosis not present

## 2023-08-11 DIAGNOSIS — I25810 Atherosclerosis of coronary artery bypass graft(s) without angina pectoris: Secondary | ICD-10-CM

## 2023-08-11 DIAGNOSIS — Z885 Allergy status to narcotic agent status: Secondary | ICD-10-CM | POA: Diagnosis not present

## 2023-08-11 DIAGNOSIS — Z79899 Other long term (current) drug therapy: Secondary | ICD-10-CM | POA: Diagnosis not present

## 2023-08-11 DIAGNOSIS — Z8249 Family history of ischemic heart disease and other diseases of the circulatory system: Secondary | ICD-10-CM | POA: Diagnosis not present

## 2023-08-11 DIAGNOSIS — I2510 Atherosclerotic heart disease of native coronary artery without angina pectoris: Secondary | ICD-10-CM | POA: Diagnosis present

## 2023-08-11 DIAGNOSIS — N1831 Chronic kidney disease, stage 3a: Secondary | ICD-10-CM | POA: Diagnosis present

## 2023-08-11 DIAGNOSIS — Z66 Do not resuscitate: Secondary | ICD-10-CM | POA: Diagnosis present

## 2023-08-11 DIAGNOSIS — E782 Mixed hyperlipidemia: Secondary | ICD-10-CM | POA: Diagnosis not present

## 2023-08-11 DIAGNOSIS — Z951 Presence of aortocoronary bypass graft: Secondary | ICD-10-CM | POA: Diagnosis not present

## 2023-08-11 DIAGNOSIS — I252 Old myocardial infarction: Secondary | ICD-10-CM | POA: Diagnosis not present

## 2023-08-11 DIAGNOSIS — I130 Hypertensive heart and chronic kidney disease with heart failure and stage 1 through stage 4 chronic kidney disease, or unspecified chronic kidney disease: Secondary | ICD-10-CM | POA: Diagnosis present

## 2023-08-11 DIAGNOSIS — Z953 Presence of xenogenic heart valve: Secondary | ICD-10-CM | POA: Diagnosis not present

## 2023-08-11 DIAGNOSIS — E0590 Thyrotoxicosis, unspecified without thyrotoxic crisis or storm: Secondary | ICD-10-CM | POA: Diagnosis not present

## 2023-08-11 DIAGNOSIS — I4891 Unspecified atrial fibrillation: Secondary | ICD-10-CM | POA: Diagnosis not present

## 2023-08-11 DIAGNOSIS — Z515 Encounter for palliative care: Secondary | ICD-10-CM | POA: Diagnosis not present

## 2023-08-11 DIAGNOSIS — Z7901 Long term (current) use of anticoagulants: Secondary | ICD-10-CM | POA: Diagnosis not present

## 2023-08-11 DIAGNOSIS — Z952 Presence of prosthetic heart valve: Secondary | ICD-10-CM | POA: Diagnosis not present

## 2023-08-11 DIAGNOSIS — I480 Paroxysmal atrial fibrillation: Secondary | ICD-10-CM | POA: Diagnosis not present

## 2023-08-11 DIAGNOSIS — Z7984 Long term (current) use of oral hypoglycemic drugs: Secondary | ICD-10-CM | POA: Diagnosis not present

## 2023-08-11 DIAGNOSIS — Z7982 Long term (current) use of aspirin: Secondary | ICD-10-CM | POA: Diagnosis not present

## 2023-08-11 DIAGNOSIS — I700 Atherosclerosis of aorta: Secondary | ICD-10-CM | POA: Diagnosis not present

## 2023-08-11 DIAGNOSIS — Z823 Family history of stroke: Secondary | ICD-10-CM | POA: Diagnosis not present

## 2023-08-11 DIAGNOSIS — K219 Gastro-esophageal reflux disease without esophagitis: Secondary | ICD-10-CM | POA: Diagnosis not present

## 2023-08-11 DIAGNOSIS — I5021 Acute systolic (congestive) heart failure: Secondary | ICD-10-CM | POA: Diagnosis not present

## 2023-08-11 DIAGNOSIS — E876 Hypokalemia: Secondary | ICD-10-CM | POA: Diagnosis not present

## 2023-08-11 DIAGNOSIS — R0602 Shortness of breath: Secondary | ICD-10-CM | POA: Diagnosis not present

## 2023-08-11 DIAGNOSIS — I5043 Acute on chronic combined systolic (congestive) and diastolic (congestive) heart failure: Secondary | ICD-10-CM | POA: Diagnosis present

## 2023-08-11 DIAGNOSIS — J90 Pleural effusion, not elsewhere classified: Secondary | ICD-10-CM | POA: Diagnosis not present

## 2023-08-11 DIAGNOSIS — I10 Essential (primary) hypertension: Secondary | ICD-10-CM | POA: Diagnosis not present

## 2023-08-11 DIAGNOSIS — N179 Acute kidney failure, unspecified: Secondary | ICD-10-CM

## 2023-08-11 DIAGNOSIS — N182 Chronic kidney disease, stage 2 (mild): Secondary | ICD-10-CM

## 2023-08-11 DIAGNOSIS — I482 Chronic atrial fibrillation, unspecified: Secondary | ICD-10-CM | POA: Diagnosis present

## 2023-08-11 DIAGNOSIS — D508 Other iron deficiency anemias: Secondary | ICD-10-CM | POA: Diagnosis not present

## 2023-08-11 DIAGNOSIS — D509 Iron deficiency anemia, unspecified: Secondary | ICD-10-CM | POA: Diagnosis present

## 2023-08-11 LAB — CBC
HCT: 38.4 % — ABNORMAL LOW (ref 39.0–52.0)
HCT: 39.6 % (ref 39.0–52.0)
Hemoglobin: 12.5 g/dL — ABNORMAL LOW (ref 13.0–17.0)
Hemoglobin: 12.9 g/dL — ABNORMAL LOW (ref 13.0–17.0)
MCH: 32.3 pg (ref 26.0–34.0)
MCH: 33.6 pg (ref 26.0–34.0)
MCHC: 32.6 g/dL (ref 30.0–36.0)
MCHC: 32.6 g/dL (ref 30.0–36.0)
MCV: 103.2 fL — ABNORMAL HIGH (ref 80.0–100.0)
MCV: 99.2 fL (ref 80.0–100.0)
Platelets: 186 10*3/uL (ref 150–400)
Platelets: 187 10*3/uL (ref 150–400)
RBC: 3.72 MIL/uL — ABNORMAL LOW (ref 4.22–5.81)
RBC: 3.99 MIL/uL — ABNORMAL LOW (ref 4.22–5.81)
RDW: 14.5 % (ref 11.5–15.5)
RDW: 14.6 % (ref 11.5–15.5)
WBC: 8.1 10*3/uL (ref 4.0–10.5)
WBC: 9.7 10*3/uL (ref 4.0–10.5)
nRBC: 0 % (ref 0.0–0.2)
nRBC: 0 % (ref 0.0–0.2)

## 2023-08-11 LAB — URINALYSIS, ROUTINE W REFLEX MICROSCOPIC
Bacteria, UA: NONE SEEN
Bilirubin Urine: NEGATIVE
Glucose, UA: >=500 mg/dL — AB
Hgb urine dipstick: NEGATIVE
Ketones, ur: NEGATIVE mg/dL
Leukocytes,Ua: NEGATIVE
Nitrite: NEGATIVE
Protein, ur: NEGATIVE mg/dL
Specific Gravity, Urine: 1.007 (ref 1.005–1.030)
pH: 5 (ref 5.0–8.0)

## 2023-08-11 LAB — BASIC METABOLIC PANEL
Anion gap: 13 (ref 5–15)
Anion gap: 9 (ref 5–15)
BUN: 26 mg/dL — ABNORMAL HIGH (ref 8–23)
BUN: 26 mg/dL — ABNORMAL HIGH (ref 8–23)
CO2: 22 mmol/L (ref 22–32)
CO2: 27 mmol/L (ref 22–32)
Calcium: 8.7 mg/dL — ABNORMAL LOW (ref 8.9–10.3)
Calcium: 8.6 mg/dL — ABNORMAL LOW (ref 8.9–10.3)
Chloride: 100 mmol/L (ref 98–111)
Chloride: 101 mmol/L (ref 98–111)
Creatinine, Ser: 1.5 mg/dL — ABNORMAL HIGH (ref 0.61–1.24)
Creatinine, Ser: 1.7 mg/dL — ABNORMAL HIGH (ref 0.61–1.24)
GFR, Estimated: 46 mL/min — ABNORMAL LOW (ref >60)
GFR, Estimated: 39 mL/min — ABNORMAL LOW (ref >60)
Glucose, Bld: 128 mg/dL — ABNORMAL HIGH (ref 70–99)
Glucose, Bld: 139 mg/dL — ABNORMAL HIGH (ref 70–99)
Potassium: 5.2 mmol/L — ABNORMAL HIGH (ref 3.5–5.1)
Potassium: 4.5 mmol/L (ref 3.5–5.1)
Sodium: 135 mmol/L (ref 135–145)
Sodium: 137 mmol/L (ref 135–145)

## 2023-08-11 LAB — HEPATIC FUNCTION PANEL
ALT: 47 U/L — ABNORMAL HIGH (ref 0–44)
ALT: 46 U/L — ABNORMAL HIGH (ref 0–44)
AST: 36 U/L (ref 15–41)
AST: 34 U/L (ref 15–41)
Albumin: 3.3 g/dL — ABNORMAL LOW (ref 3.5–5.0)
Albumin: 3.3 g/dL — ABNORMAL LOW (ref 3.5–5.0)
Alkaline Phosphatase: 97 U/L (ref 38–126)
Alkaline Phosphatase: 97 U/L (ref 38–126)
Bilirubin, Direct: 0.2 mg/dL (ref 0.0–0.2)
Bilirubin, Direct: 0.2 mg/dL (ref 0.0–0.2)
Indirect Bilirubin: 1.1 mg/dL — ABNORMAL HIGH (ref 0.3–0.9)
Indirect Bilirubin: 1.1 mg/dL — ABNORMAL HIGH (ref 0.3–0.9)
Total Bilirubin: 1.3 mg/dL — ABNORMAL HIGH (ref 0.3–1.2)
Total Bilirubin: 1.3 mg/dL — ABNORMAL HIGH (ref 0.3–1.2)
Total Protein: 6.7 g/dL (ref 6.5–8.1)
Total Protein: 6.8 g/dL (ref 6.5–8.1)

## 2023-08-11 LAB — PROTIME-INR
INR: 2.4 — ABNORMAL HIGH (ref 0.8–1.2)
Prothrombin Time: 26 s — ABNORMAL HIGH (ref 11.4–15.2)

## 2023-08-11 LAB — TROPONIN I (HIGH SENSITIVITY)
Troponin I (High Sensitivity): 20 ng/L — ABNORMAL HIGH (ref <18)
Troponin I (High Sensitivity): 20 ng/L — ABNORMAL HIGH (ref <18)

## 2023-08-11 LAB — MAGNESIUM: Magnesium: 2.2 mg/dL (ref 1.7–2.4)

## 2023-08-11 LAB — BRAIN NATRIURETIC PEPTIDE: B Natriuretic Peptide: 3549 pg/mL — ABNORMAL HIGH (ref 0.0–100.0)

## 2023-08-11 MED ORDER — PANTOPRAZOLE SODIUM 40 MG PO TBEC
40.0000 mg | DELAYED_RELEASE_TABLET | Freq: Every day | ORAL | Status: DC
  Administered 2023-08-11 – 2023-08-14 (×4): 40 mg via ORAL
  Filled 2023-08-11 (×4): qty 1

## 2023-08-11 MED ORDER — FUROSEMIDE 10 MG/ML IJ SOLN
40.0000 mg | Freq: Once | INTRAMUSCULAR | Status: AC
  Administered 2023-08-11: 40 mg via INTRAVENOUS
  Filled 2023-08-11: qty 4

## 2023-08-11 MED ORDER — AMIODARONE HCL 200 MG PO TABS
100.0000 mg | ORAL_TABLET | Freq: Every day | ORAL | Status: DC
  Administered 2023-08-11 – 2023-08-13 (×3): 100 mg via ORAL
  Filled 2023-08-11 (×3): qty 1

## 2023-08-11 MED ORDER — SODIUM CHLORIDE 0.9% FLUSH
3.0000 mL | Freq: Two times a day (BID) | INTRAVENOUS | Status: DC
  Administered 2023-08-11 – 2023-08-14 (×7): 3 mL via INTRAVENOUS

## 2023-08-11 MED ORDER — ATORVASTATIN CALCIUM 80 MG PO TABS
80.0000 mg | ORAL_TABLET | Freq: Every day | ORAL | Status: DC
  Administered 2023-08-11 – 2023-08-14 (×4): 80 mg via ORAL
  Filled 2023-08-11 (×4): qty 1

## 2023-08-11 MED ORDER — ASPIRIN 81 MG PO TBEC
81.0000 mg | DELAYED_RELEASE_TABLET | Freq: Every day | ORAL | Status: DC
  Administered 2023-08-11 – 2023-08-14 (×4): 81 mg via ORAL
  Filled 2023-08-11 (×4): qty 1

## 2023-08-11 MED ORDER — SENNOSIDES-DOCUSATE SODIUM 8.6-50 MG PO TABS: 1.0000 | ORAL_TABLET | Freq: Every evening | ORAL | Status: DC | PRN

## 2023-08-11 MED ORDER — GUAIFENESIN-DM 100-10 MG/5ML PO SYRP: 10.0000 mL | ORAL_SOLUTION | ORAL | Status: DC | PRN

## 2023-08-11 MED ORDER — ACETAMINOPHEN 325 MG PO TABS
650.0000 mg | ORAL_TABLET | Freq: Four times a day (QID) | ORAL | Status: DC | PRN
  Administered 2023-08-12 (×2): 650 mg via ORAL
  Filled 2023-08-11 (×2): qty 2

## 2023-08-11 MED ORDER — FUROSEMIDE 10 MG/ML IJ SOLN
40.0000 mg | Freq: Two times a day (BID) | INTRAMUSCULAR | Status: DC
  Administered 2023-08-11 – 2023-08-13 (×5): 40 mg via INTRAVENOUS
  Filled 2023-08-11 (×6): qty 4

## 2023-08-11 MED ORDER — WARFARIN SODIUM 2 MG PO TABS
2.0000 mg | ORAL_TABLET | Freq: Once | ORAL | Status: AC
  Administered 2023-08-11: 2 mg via ORAL
  Filled 2023-08-11: qty 1

## 2023-08-11 MED ORDER — WARFARIN - PHARMACIST DOSING INPATIENT: Freq: Every day | Status: DC

## 2023-08-11 MED ORDER — METOPROLOL TARTRATE 12.5 MG HALF TABLET
12.5000 mg | ORAL_TABLET | Freq: Two times a day (BID) | ORAL | Status: DC
  Administered 2023-08-11 – 2023-08-14 (×7): 12.5 mg via ORAL
  Filled 2023-08-11 (×7): qty 1

## 2023-08-11 MED ORDER — ACETAMINOPHEN 650 MG RE SUPP: 650.0000 mg | Freq: Four times a day (QID) | RECTAL | Status: DC | PRN

## 2023-08-11 NOTE — ED Notes (Signed)
Family at bedside. 

## 2023-08-11 NOTE — ED Notes (Signed)
ED TO INPATIENT HANDOFF REPORT  ED Nurse Name and Phone #: Marcello Moores [phone]  S Name/Age/Gender Jesus [name] 82 y.o. male Room/Bed: 025C/025C  Code Status   Code Status: Prior  Home/SNF/Other Home Patient oriented to: self, place, time, and situation Is this baseline? Yes   Triage Complete: Triage complete  Chief Complaint Acute on chronic combined systolic and diastolic CHF (congestive heart failure) (HCC) [I50.43]  Triage Note Pt presents with dyspnea on exertion for the past 24 hours.  Pt has no complaints other than getting "tired out" after doing just a couple of tasks.  Pt describes his breathing has "labored"  NAD at time of triage.    Allergies Allergies  Allergen Reactions   Amlodipine Other (See Comments)    Swelling?   Codeine Other (See Comments)    Pt does not remember   Iodine Swelling    Level of Care/Admitting Diagnosis ED Disposition     ED Disposition  Admit   Condition  --   Comment  Hospital Area: [facility] [[id number]]  Level of Care: Telemetry Cardiac [[id number]]  May admit patient to [facility] or [facility] if equivalent level of care is available:: Yes  Covid Evaluation: Asymptomatic - no recent exposure (last 10 days) testing not required  Diagnosis: Acute on chronic combined systolic and diastolic CHF (congestive heart failure) [facility]) [[id number]]  Admitting Physician: Briscoe Deutscher [[id number]]  Attending Physician: Briscoe Deutscher [[id number]]  Certification:: I certify this patient will need inpatient services for at least 2 midnights  Expected Medical Readiness: 08/14/2023          B Medical/Surgery History Past Medical History:  Diagnosis Date   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 S/P Ant MI 1980;  b. 1997 S/P CABG x 8 (LIMA  to diag-LAD, SVG to OM1-OM2-OM3, SVG to [facility] - Dr Andrey Campanile);  c. 01/2015 Cath: 3VD, 8/8 patent grafts.   Cardiomyopathy    Dysrhythmia    Esophageal reflux    GERD (gastroesophageal reflux disease)    Headache    History of colonoscopy    History of transesophageal echocardiography (TEE) for monitoring    Other and unspecified hyperlipidemia    Paroxysmal atrial fibrillation (HCC) 02/20/2022   S/P angioplasty with stent 02/17/22 DES to proximal VG to PDA 02/20/2022   S/P TAVR (transcatheter aortic valve replacement)    a. 03/2015 26 mm Edwards Sapien 3 transcatheter heart valve placed via open left transfemoral approach.   Severe aortic stenosis 08/10/2012   Skin lesions, generalized    facial which may represent actinic keratoses and possible photosensitivity from Amiodarone   Unspecified essential hypertension    Past Surgical History:  Procedure Laterality Date   CARDIAC CATHETERIZATION     CARDIOVERSION  11/18/2006   Dr. Jacklynn Bue   CATARACT EXTRACTION W/ INTRAOCULAR LENS IMPLANT  April '13  (Dr. Dagoberto Ligas)   left eye only   CHOLECYSTECTOMY     COLONOSCOPY WITH PROPOFOL N/A 07/11/2023   Procedure: COLONOSCOPY WITH PROPOFOL;  Surgeon: [name], MD;  Location: [facility] ENDOSCOPY;  Service: Gastroenterology;  Laterality: N/A;   CORONARY ARTERY BYPASS GRAFT  02/09/1996   LIMA to diag-LAD, SVG to OM1-OM2-OM3, SVG to [facility]   CORONARY STENT INTERVENTION N/A 02/17/2022  Procedure: CORONARY STENT INTERVENTION;  Surgeon: Kathleene Hazel, MD;  Location: [facility] INVASIVE CV LAB;  Service: Cardiovascular;  Laterality: N/A;   CORONARY/GRAFT ANGIOGRAPHY N/A 02/17/2022   Procedure: CORONARY/GRAFT ANGIOGRAPHY;  Surgeon: Kathleene Hazel, MD;  Location: [facility] INVASIVE CV LAB;  Service: Cardiovascular;  Laterality: N/A;   EYE SURGERY     HEMOSTASIS CLIP PLACEMENT  07/11/2023   Procedure: HEMOSTASIS CLIP PLACEMENT;  Surgeon: [name], MD;  Location: [facility] ENDOSCOPY;  Service:  Gastroenterology;;   HEMOSTASIS CONTROL  07/11/2023   Procedure: HEMOSTASIS CONTROL;  Surgeon: [name], MD;  Location: [facility] ENDOSCOPY;  Service: Gastroenterology;;   LEFT AND RIGHT HEART CATHETERIZATION WITH CORONARY/GRAFT ANGIOGRAM N/A 01/26/2015   Procedure: LEFT AND RIGHT HEART CATHETERIZATION WITH Isabel Caprice;  Surgeon: Micheline Chapman, MD;  Location: [facility] CATH LAB;  Service: Cardiovascular;  Laterality: N/A;   POLYPECTOMY  07/11/2023   Procedure: POLYPECTOMY;  Surgeon: [name], MD;  Location: [facility] ENDOSCOPY;  Service: Gastroenterology;;   TEE WITHOUT CARDIOVERSION N/A 03/10/2015   Procedure: TRANSESOPHAGEAL ECHOCARDIOGRAM (TEE);  Surgeon: [name], MD;  Location: [facility] OR;  Service: Open Heart Surgery;  Laterality: N/A;   TONSILLECTOMY     TRANSCATHETER AORTIC VALVE REPLACEMENT, TRANSFEMORAL N/A 03/10/2015   Procedure: TRANSCATHETER AORTIC VALVE REPLACEMENT, TRANSFEMORAL;  Surgeon: [name], MD;  Location: [facility] OR;  Service: Open Heart Surgery;  Laterality: N/A;     A IV Location/Drains/Wounds Patient Lines/Drains/Airways Status     Active Line/Drains/Airways     Name Placement date Placement time Site Days   Peripheral IV 08/11/23 20 G Left Antecubital 08/11/23  0155  Antecubital  less than 1            Intake/Output Last 24 hours No intake or output data in the 24 hours ending 08/11/23 0249  Labs/Imaging Results for orders placed or performed during the hospital encounter of 08/10/23 (from the past 48 hour(s))  Basic metabolic panel     Status: Abnormal   Collection Time: 08/11/23 12:16 AM  Result Value Ref Range   Sodium 135 135 - 145 mmol/L   Potassium 5.2 (H) 3.5 - 5.1 mmol/L   Chloride 100 98 - 111 mmol/L   CO2 22 22 - 32 mmol/L   Glucose, Bld 128 (H) 70 - 99 mg/dL    Comment: Glucose reference range applies only to samples taken after fasting for at least 8 hours.   BUN 26 (H) 8 - 23 mg/dL   Creatinine, Ser 6.64 (H) 0.61 - 1.24 mg/dL    Calcium 8.7 (L) 8.9 - 10.3 mg/dL   GFR, Estimated 46 (L) >60 mL/min    Comment: (NOTE) Calculated using the CKD-EPI Creatinine Equation (2021)    Anion gap 13 5 - 15    Comment: Performed at [facility], [address], [place], [place] [address]  CBC     Status: Abnormal   Collection Time: 08/11/23 12:16 AM  Result Value Ref Range   WBC 9.7 4.0 - 10.5 K/uL   RBC 3.72 (L) 4.22 - 5.81 MIL/uL   Hemoglobin 12.5 (L) 13.0 - 17.0 g/dL   HCT 42.5 (L) 95.6 - 38.7 %   MCV 103.2 (H) 80.0 - 100.0 fL   MCH 33.6 26.0 - 34.0 pg   MCHC 32.6 30.0 - 36.0 g/dL   RDW 56.4 33.2 - 95.1 %   Platelets 187 150 - 400 K/uL   nRBC 0.0 0.0 - 0.2 %    Comment: Performed at [facility], [address] [place], [place] [address]  Troponin I (High Sensitivity)     Status: Abnormal   Collection Time: 08/11/23 12:16 AM  Result Value Ref Range   Troponin I (High Sensitivity) 20 (H) <18 ng/L    Comment: (NOTE) Elevated high sensitivity troponin I (hsTnI) values and significant  changes across serial measurements may suggest ACS but many other  chronic and acute conditions are known to elevate hsTnI results.  Refer to the "Links" section for chest pain algorithms and additional  guidance. Performed at [facility], [address], [place], [place] [address]   Brain natriuretic peptide     Status: Abnormal   Collection Time: 08/11/23 12:16 AM  Result Value Ref Range   B Natriuretic Peptide 3,549.0 (H) 0.0 - 100.0 pg/mL    Comment: Performed at [facility], [address] [place], [place] [address]  Hepatic function panel     Status: Abnormal   Collection Time: 08/11/23 12:16 AM  Result Value Ref Range   Total Protein 6.7 6.5 - 8.1 g/dL   Albumin 3.3 (L) 3.5 - 5.0 g/dL   AST 36 15 - 41 U/L   ALT 47 (H) 0 - 44 U/L   Alkaline Phosphatase 97 38 - 126 U/L   Total Bilirubin 1.3 (H) 0.3 - 1.2 mg/dL   Bilirubin, Direct 0.2 0.0 - 0.2 mg/dL   Indirect Bilirubin 1.1 (H) 0.3 - 0.9 mg/dL     Comment: Performed at [facility], [address], [place], [place] [address]  Protime-INR     Status: Abnormal   Collection Time: 08/11/23  1:55 AM  Result Value Ref Range   Prothrombin Time 26.0 (H) 11.4 - 15.2 seconds   INR 2.4 (H) 0.8 - 1.2    Comment: (NOTE) INR goal varies based on device and disease states. Performed at [facility], [address] [place], [place] [address]    DG Chest 2 View  Result Date: 08/11/2023 CLINICAL DATA:  Shortness of breath EXAM: CHEST - 2 VIEW COMPARISON:  07/12/2023 FINDINGS: Prior CABG. Heart is borderline in size. Mediastinal contours within normal limits. Aortic atherosclerosis. Small bilateral effusions with bibasilar opacities, likely atelectasis. IMPRESSION: Borderline heart size. Small bilateral effusions with bibasilar atelectasis. Electronically Signed   By: Charlett Nose M.D.   On: 08/11/2023 01:06    Pending Labs Unresulted Labs (From admission, onward)     Start     Ordered   08/11/23 0128  Urinalysis, Routine w reflex microscopic -Urine, Clean Catch  ONCE - URGENT,   URGENT       Question:  Specimen Source  Answer:  Urine, Clean Catch   08/11/23 0127            Vitals/Pain Today's Vitals   08/10/23 2356 08/11/23 0005  BP: 115/77   Pulse: (!) 107   Resp: 19   Temp: 97.6 F (36.4 C)   SpO2: 95%   PainSc:  0-No pain    Isolation Precautions No active isolations  Medications Medications  furosemide (LASIX) injection 40 mg (40 mg Intravenous Given 08/11/23 0233)    Mobility walks with device     Focused Assessments     R Recommendations: See Admitting Provider Note  Report given to:   Additional Notes:

## 2023-08-11 NOTE — Plan of Care (Signed)
  Problem: Education: Goal: Ability to demonstrate management of disease process will improve Outcome: Progressing   Problem: Activity: Goal: Capacity to carry out activities will improve Outcome: Progressing   Problem: Cardiac: Goal: Ability to achieve and maintain adequate cardiopulmonary perfusion will improve Outcome: Progressing   Problem: Activity: Goal: Capacity to carry out activities will improve Outcome: Progressing   Problem: Cardiac: Goal: Ability to achieve and maintain adequate cardiopulmonary perfusion will improve Outcome: Progressing

## 2023-08-11 NOTE — Progress Notes (Signed)
ANTICOAGULATION CONSULT NOTE   Pharmacy Consult for Warfarin Indication: atrial fibrillation  Allergies  Allergen Reactions   Amlodipine Other (See Comments)    Swelling?   Codeine Other (See Comments)    Pt does not remember   Iodine Swelling   Vital Signs: Temp: 97.6 F (36.4 C) (09/05 2356) BP: 118/84 (09/06 0315) Pulse Rate: 100 (09/06 0315)  Labs: Recent Labs    08/11/23 0016 08/11/23 0155  HGB 12.5*  --   HCT 38.4*  --   PLT 187  --   LABPROT  --  26.0*  INR  --  2.4*  CREATININE 1.50*  --   TROPONINIHS 20* 20*    Estimated Creatinine Clearance: 27.1 mL/min (A) (by C-G formula based on SCr of 1.5 mg/dL (H)).  Assessment: Jesus [name] is a 82 y.o. year old male admitted on 08/10/2023 with concern for HF exacerbation. On warfarin prior to admission, last dose PTA 9/5. Prior to admission warfarin regimen 2 mg every Mon, Wed, Fri; 4 mg all other days.   9/6 INR 2.4, therapeutic   Goal of Therapy:  INR 2-3 Monitor platelets by anticoagulation protocol: Yes   Plan:  Plan for warfarin 2mg  today  F/u daily INR and bleeding assessments   Alorah Mcree S Maribell Demeo 08/11/2023,3:37 AM

## 2023-08-11 NOTE — ED Triage Notes (Signed)
Pt presents with dyspnea on exertion for the past 24 hours.  Pt has no complaints other than getting "tired out" after doing just a couple of tasks.  Pt describes his breathing has "labored"  NAD at time of triage.

## 2023-08-11 NOTE — Progress Notes (Signed)
   Heart Failure Stewardship Pharmacist Progress Note   PCP: Kristian Covey, MD PCP-Cardiologist: [name], MD    HPI:  82 yo M with PMH of CHF, CAD s/p CABG, afib, severe AS s/p TAVR, CKD II, and HTN.   Presented to the ED on 9/6 with shortness of breath. He takes lasix PRN at home but reported taking doses 3 times within the last week. He had some improvement with LE edema and his weight, but reports that his legs have gotten worse and he has severe shortness of breath with exertion. CXR with small bilateral effusions with bibasilar atelectasis. BNP elevated. Last ECHO 02/2022 showed LVEF 35-40%, RV normal, trivial MR.   Current HF Medications: Diuretic: furosemide 40 mg IV BID Beta Blocker: metoprolol tartrate 12.5 mg BID  Prior to admission HF Medications: Diuretic: furosemide 20 mg daily PRN Beta blocker: metoprolol tartrate 12.5 mg BID SGLT2i: Jardiance 10 mg daily  Pertinent Lab Values: Serum creatinine 1.50, BUN 26, Potassium 5.2, Sodium 135, BNP 3549, A1c 6  Vital Signs: Weight: 115 lbs (admission weight: 115 lbs) Blood pressure: 110-130/70s  Heart rate: 80-100s  I/O: incomplete  Medication Assistance / Insurance Benefits Check: Does the patient have prescription insurance?  Yes Type of insurance plan: UHC Medicare  Does the patient qualify for medication assistance through manufacturers or grants?   Pending Eligible grants and/or patient assistance programs: pending Medication assistance applications in progress: none  Medication assistance applications approved: none Approved medication assistance renewals will be completed by: pending  Outpatient Pharmacy:  Prior to admission outpatient pharmacy: [facility] Is the patient willing to use [facility] TOC pharmacy at discharge? Yes Is the patient willing to transition their outpatient pharmacy to utilize a [facility] outpatient pharmacy?   No    Assessment: 1. Acute on chronic systolic CHF (LVEF 35-40%),  due to ICM. NYHA class III symptoms. - Continue furosemide 40 mg IV BID. Strict I/Os (none have been documented yet) and daily weights. Keep K>4 and Mg>2. - Continue metoprolol tartrate 12.5 mg BID, will need to consolidate to metoprolol XL at discharge - Consider restarting Jardiance 10 mg daily once creatinine improves. Baseline creatinine ~1.1. - No ACE/ARB/ARNI or MRA with hyperkalemia today. Trend potassium.    Plan: 1) Medication changes recommended at this time: - Continue IV diuresis - Restart Jardiance once creatinine improves - Change metoprolol tartrate to metoprolol XL on discharge  2) Patient assistance: - In the donut hole - copays during this period are higher than usual - Entresto copay in the donut hole is $173 - Farxiga copay in the donut hole is $146  3)  Education  - Patient has been educated on current HF medications and potential additions to HF medication regimen - Patient verbalizes understanding that over the next few months, these medication doses may change and more medications may be added to optimize HF regimen - Patient has been educated on basic disease state pathophysiology and goals of therapy   Sharen Hones, PharmD, BCPS Heart Failure Engineer, building services Phone [phone]

## 2023-08-11 NOTE — ED Provider Notes (Signed)
[place] EMERGENCY DEPARTMENT AT [facility] Provider Note   CSN: [id number] Arrival date & time: 08/10/23  2339     History  Chief Complaint  Patient presents with   Shortness of Breath    Jesus [name] is a 82 y.o. male.  Presents to the emergency department for evaluation of shortness of breath.  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  Patient only takes Lasix as needed.  He took Lasix on the day, Thursday and Friday of this past week for leg swelling and his weight went down.  Patient now reporting that his legs are swollen again and he has severe shortness of breath with trying to walk.  No chest pain.       Home Medications Prior to Admission medications   Medication Sig Start Date End Date Taking? Authorizing Provider  acetaminophen (TYLENOL) 325 MG tablet Take 1-2 tablets (325-650 mg total) by mouth every 4 (four) hours as needed for mild pain. 03/15/22   Love, Evlyn Kanner, PA-C  amiodarone (PACERONE) 200 MG tablet Take 0.5 tablets (100 mg total) by mouth daily. 07/04/23   Duke Salvia, MD  amoxicillin (AMOXIL) 500 MG capsule TAKE FOUR CAPSULES BY MOUTH ONE hour prior TO dental procedure 06/30/23   [name], MD  Ascorbic Acid (VITAMIN C) 1000 MG tablet Take 1,000 mg by mouth daily.    [provider]  aspirin EC 81 MG tablet Take 1 tablet (81 mg total) by mouth daily. Swallow whole. 06/15/23   [name], MD  atorvastatin (LIPITOR) 80 MG tablet Take 1 tablet (80 mg total) by mouth daily. 08/02/23   [name], Elberta Fortis, MD  ezetimibe (ZETIA) 10 MG tablet Take 1 tablet (10 mg total) by mouth daily. 10/05/22   Duke Salvia, MD  furosemide (LASIX) 20 MG tablet Take one tablet by mouth daily as needed for increased leg edema 08/02/23   Kristian Covey, MD  guaiFENesin-dextromethorphan (ROBITUSSIN DM) 100-10 MG/5ML syrup Take 10 mLs by mouth every 4 (four) hours as needed for cough. 07/16/23   Lanae Boast, MD   JARDIANCE 10 MG TABS tablet TAKE 1 TABLET BY MOUTH EVERY DAY 07/24/23   [name], Elberta Fortis, MD  metoprolol tartrate (LOPRESSOR) 25 MG tablet Take 0.5 tablets (12.5 mg total) by mouth 2 (two) times daily. 08/02/23   [name], Elberta Fortis, MD  Multiple Vitamins-Minerals (MULTIVITAMIN,TX-MINERALS) tablet Take 1 tablet by mouth daily.    [provider]  nitroGLYCERIN (NITROSTAT) 0.4 MG SL tablet Place 1 tablet (0.4 mg total) under the tongue every 5 (five) minutes x 3 doses as needed for chest pain. 02/20/22   Leone Brand, NP  pantoprazole (PROTONIX) 40 MG tablet Take 1 tablet (40 mg total) by mouth daily. 07/16/23   Lanae Boast, MD  warfarin (COUMADIN) 4 MG tablet TAKE 1 TABLET BY MOUTH DAILY EXCEPT TAKE 1/2 TABLET ON MONDAYS AND FRIDAYS OR AS DIRECTED BY ANTICOAGULATION CLINIC 05/03/23   [name], Elberta Fortis, MD      Allergies    Amlodipine, Codeine, and Iodine    Review of Systems   Review of Systems  Physical Exam Updated Vital Signs BP 115/77 (BP Location: Right Arm)   Pulse (!) 107   Temp 97.6 F (36.4 C)   Resp 19   SpO2 95%  Physical Exam Vitals and nursing note reviewed.  Constitutional:      General: He is not in acute distress.    Appearance: He is  well-developed.  HENT:     Head: Normocephalic and atraumatic.     Mouth/Throat:     Mouth: Mucous membranes are moist.  Eyes:     General: Vision grossly intact. Gaze aligned appropriately.     Extraocular Movements: Extraocular movements intact.     Conjunctiva/sclera: Conjunctivae normal.  Cardiovascular:     Rate and Rhythm: Normal rate and regular rhythm.     Pulses: Normal pulses.     Heart sounds: Normal heart sounds, S1 normal and S2 normal. No murmur heard.    No friction rub. No gallop.  Pulmonary:     Effort: Tachypnea and accessory muscle usage present.  Abdominal:     Palpations: Abdomen is soft.     Tenderness: There is no abdominal tenderness. There is no guarding or rebound.     Hernia: No hernia is  present.  Musculoskeletal:        General: No swelling.     Cervical back: Full passive range of motion without pain, normal range of motion and neck supple. No pain with movement, spinous process tenderness or muscular tenderness. Normal range of motion.     Right lower leg: Edema present.     Left lower leg: Edema present.  Skin:    General: Skin is warm and dry.     Capillary Refill: Capillary refill takes less than 2 seconds.     Findings: No ecchymosis, erythema, lesion or wound.  Neurological:     Mental Status: He is alert and oriented to person, place, and time.     GCS: GCS eye subscore is 4. GCS verbal subscore is 5. GCS motor subscore is 6.     Cranial Nerves: Cranial nerves 2-12 are intact.     Sensory: Sensation is intact.     Motor: Motor function is intact. No weakness or abnormal muscle tone.     Coordination: Coordination is intact.  Psychiatric:        Mood and Affect: Mood normal.        Speech: Speech normal.        Behavior: Behavior normal.     ED Results / Procedures / Treatments   Labs (all labs ordered are listed, but only abnormal results are displayed) Labs Reviewed  BASIC METABOLIC PANEL - Abnormal; Notable for the following components:      Result Value   Potassium 5.2 (*)    Glucose, Bld 128 (*)    BUN 26 (*)    Creatinine, Ser 1.50 (*)    Calcium 8.7 (*)    GFR, Estimated 46 (*)    All other components within normal limits  CBC - Abnormal; Notable for the following components:   RBC 3.72 (*)    Hemoglobin 12.5 (*)    HCT 38.4 (*)    MCV 103.2 (*)    All other components within normal limits  BRAIN NATRIURETIC PEPTIDE - Abnormal; Notable for the following components:   B Natriuretic Peptide 3,549.0 (*)    All other components within normal limits  HEPATIC FUNCTION PANEL - Abnormal; Notable for the following components:   Albumin 3.3 (*)    ALT 47 (*)    Total Bilirubin 1.3 (*)    Indirect Bilirubin 1.1 (*)    All other components within  normal limits  TROPONIN I (HIGH SENSITIVITY) - Abnormal; Notable for the following components:   Troponin I (High Sensitivity) 20 (*)    All other components within normal limits  URINALYSIS, ROUTINE W  REFLEX MICROSCOPIC  PROTIME-INR  TROPONIN I (HIGH SENSITIVITY)    EKG None ED ECG REPORT   Date: 08/11/2023  Rate: 100  Rhythm: atrial fibrillation  QRS Axis: normal  Intervals: normal  ST/T Wave abnormalities: ST depressions diffusely  Conduction Disutrbances:nonspecific intraventricular conduction delay  Narrative Interpretation:   Old EKG Reviewed:  was SR on 07/13/2023  I have personally reviewed the EKG tracing and agree with the computerized printout as noted.   Radiology DG Chest 2 View  Result Date: 08/11/2023 CLINICAL DATA:  Shortness of breath EXAM: CHEST - 2 VIEW COMPARISON:  07/12/2023 FINDINGS: Prior CABG. Heart is borderline in size. Mediastinal contours within normal limits. Aortic atherosclerosis. Small bilateral effusions with bibasilar opacities, likely atelectasis. IMPRESSION: Borderline heart size. Small bilateral effusions with bibasilar atelectasis. Electronically Signed   By: Charlett Nose M.D.   On: 08/11/2023 01:06    Procedures Procedures    Medications Ordered in ED Medications  furosemide (LASIX) injection 40 mg (has no administration in time range)    ED Course/ Medical Decision Making/ A&P                                 Medical Decision Making Amount and/or Complexity of Data Reviewed External Data Reviewed: radiology, ECG and notes.    Details: Echo 02/2023  IMPRESSIONS     1. Left ventricular ejection fraction, by estimation, is 35 to 40%. The  left ventricle has moderately decreased function. The left ventricle  demonstrates regional wall motion abnormalities (see scoring  diagram/findings for description).  Inferior/inferolateral akinesis. Left ventricular diastolic parameters are  consistent with Grade II diastolic dysfunction  (pseudonormalization).  Elevated left atrial pressure.   2. Right ventricular systolic function is normal. The right ventricular  size is mildly enlarged.   3. Left atrial size was severely dilated.   4. The mitral valve is normal in structure. Trivial mitral valve  regurgitation. No evidence of mitral stenosis.   5. The aortic valve has been repaired/replaced. Aortic valve  regurgitation is mild. There is a 26 mm Edwards Sapien prosthetic (TAVR)  valve present in the aortic position. Procedure Date: 03/10/15. Echo  findings are consistent with perivalvular leak of  the aortic prosthesis. Vmax 2.9 m/s, MG (unchanged from prior  echo), EOA 1.4 cm^2, DI 0.46   6. The inferior vena cava is normal in size with greater than 50%  respiratory variability, suggesting right atrial pressure of 3 mmHg.   Labs: ordered. Decision-making details documented in ED Course. Radiology: ordered and independent interpretation performed. Decision-making details documented in ED Course. ECG/medicine tests: ordered and independent interpretation performed. Decision-making details documented in ED Course.  Risk Prescription drug management.   Differential Diagnosis considered includes, but not limited to: CHF; Pneumonia; ACS; PE   Presents to the emergency department for evaluation of shortness of breath.  Patient experiencing significant shortness of breath with minimal exertion, appears to be short of breath at rest here in the emergency department.  He did take Lasix as he is instructed to last week and his weight did come down but he is still experiencing swelling of the legs and shortness of breath.  Patient with ejection fraction of 30 to 35%, grade 2 diastolic heart failure.  Initiated diuresis here.  BNP is markedly elevated.  Will require hospitalization for further management.        Final Clinical Impression(s) / ED Diagnoses Final diagnoses:  Acute  on chronic combined systolic and  diastolic congestive heart failure [facility])    Rx / DC Orders ED Discharge Orders     None         Emry Tobin, Canary Brim, MD 08/11/23 [phone]

## 2023-08-11 NOTE — Progress Notes (Signed)
Patient was seen and examined.  Admitted early morning hours by nighttime hospitalist.  82 year old man with history of coronary artery disease and remote CABG, PAF on Coumadin, severe aortic stenosis status post TAVR, CKD stage II, chronic combined heart failure with known ejection fraction 35% presented with about 3 days of exertional dyspnea and leg swelling despite increasing dose of Lasix at home.  In the emergency room on room air.  Blood pressure stable.  Chest x-ray with a small bilateral pleural effusion.  Creatinine 1.5.  Potassium 5.2.  BNP 3549.  Started on Lasix and admitted to the hospital.  Acute on chronic combined heart failure: Known EF of 35 to 40% with grade 2 diastolic dysfunction from 02/2023.  Repeat echocardiogram today. Continue diuresis 40 mg IV Lasix every 12 hours, intake and output monitoring, electrolyte monitoring renal function test monitoring and urine output monitoring.  Patient does have slightly worsening creatinine.  Will recheck tomorrow morning.  Continue warfarin amiodarone and metoprolol.  Patient is already on aspirin Lipitor and metoprolol.   Total time spent: 30 minutes.  Same-day admit.  No charge visit.

## 2023-08-11 NOTE — Progress Notes (Addendum)
Heart Failure Navigator Progress Note  PCP: Kristian Covey, MD PCP-Cardiologist: Dr. Excell Seltzer Admission Diagnosis: Acute on chronic systolic CHF Admitted from: Home  Presentation:   82 yo M with PMH of CHF, CAD s/p CABG, afib, severe AS s/p TAVR, CKD II, and HTN.    Presented to the ED on 9/6 with shortness of breath. He takes lasix PRN at home but reported taking doses 3 times within the last week. He had some improvement with LE edema and his weight, but reports that his legs have gotten worse and he has severe shortness of breath with exertion. CXR with small bilateral effusions with bibasilar atelectasis. BNP elevated. Last ECHO 02/2022 showed LVEF 35-40%, RV normal, trivial MR.   Reports medication compliance and daily weights at home. 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   ECHO/ LVEF: 35-40%  Education Assessment and Provision:  Detailed education and instructions provided on heart failure disease management including the following:  Signs and symptoms of Heart Failure When to call the physician Importance of daily weights Low sodium diet Fluid restriction Medication management Anticipated future follow-up appointments  Patient education given on each of the above topics.  Patient acknowledges understanding via teach back method and acceptance of all instructions.  Education Materials:  "Living Better With Heart Failure" Booklet, HF zone tool, & Daily Weight Tracker Tool.  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 S/P Ant MI 1980;  b. 1997 S/P CABG x 8 (LIMA to diag-LAD, SVG to OM1-OM2-OM3, SVG to [facility] - Dr  Andrey Campanile);  c. 01/2015 Cath: 3VD, 8/8 patent grafts.   Cardiomyopathy    Dysrhythmia    Esophageal reflux    GERD (gastroesophageal reflux disease)    Headache    History of colonoscopy    History of transesophageal echocardiography (TEE) for monitoring    Other and unspecified hyperlipidemia    Paroxysmal atrial fibrillation (HCC) 02/20/2022   S/P angioplasty with stent 02/17/22 DES to proximal VG to PDA 02/20/2022   S/P TAVR (transcatheter aortic valve replacement)    a. 03/2015 26 mm Edwards Sapien 3 transcatheter heart valve placed via open left transfemoral approach.   Severe aortic stenosis 08/10/2012   Skin lesions, generalized    facial which may represent actinic keratoses and possible photosensitivity from Amiodarone   Unspecified essential hypertension      Social History   Socioeconomic History   Marital status: Married    Spouse name: Not on file   Number of children: Not on file   Years of education: Not on file   Highest education level: Not on file  Occupational History   Not on file  Tobacco Use   Smoking status: Never   Smokeless tobacco: Never   Tobacco comments:    Does not smoke.  Vaping Use   Vaping status: Never Used  Substance and Sexual Activity   Alcohol use: No    Alcohol/week: 0.0 standard drinks of alcohol   Drug use: No   Sexual activity: Not Currently  Other Topics Concern   Not on file  Social  History Narrative   HSG. [employer] - [employer] 6 years. Married - '69 - 1 year/divorced. '76 - . No children. Work - mfg/textiles - Audiological scientist; currently works doing maintenance. [employer] - they have discussed this. Provided packet august '13.                Social Determinants of Health   Financial Resource Strain: Low Risk  (08/03/2023)   Overall Financial Resource Strain (CARDIA)    Difficulty of Paying Living Expenses: Not hard at all  Food Insecurity: No Food Insecurity (08/03/2023)   Hunger Vital Sign    Worried About Running Out of Food in  the Last Year: Never true    Ran Out of Food in the Last Year: Never true  Transportation Needs: No Transportation Needs (08/03/2023)   PRAPARE - Administrator, Civil Service (Medical): No    Lack of Transportation (Non-Medical): No  Physical Activity: Sufficiently Active (08/03/2023)   Exercise Vital Sign    Days of Exercise per Week: 5 days    Minutes of Exercise per Session: 60 min  Stress: No Stress Concern Present (08/03/2023)   [employer] of Occupational Health - Occupational Stress Questionnaire    Feeling of Stress : Not at all  Social Connections: Socially Integrated (08/03/2023)   Social Connection and Isolation Panel [NHANES]    Frequency of Communication with Friends and Family: More than three times a week    Frequency of Social Gatherings with Friends and Family: More than three times a week    Attends Religious Services: More than 4 times per year    Active Member of [employer] or Organizations: Yes    Attends Engineer, structural: More than 4 times per year    Marital Status: Married    High Risk Criteria for Readmission and/or Poor Patient Outcomes: Heart failure hospital admissions (last 6 months): 0  No Show rate: 1% Difficult social situation: No Demonstrates medication adherence: Yes Primary Language: English  Barriers of Care:   Donut hole for insurance  Considerations/Referrals:   Referral made to Heart Failure Pharmacist Stewardship: yes Referral made to Heart Failure CSW/NCM TOC: no Referral made to Heart & Vascular TOC clinic: yes, appt scheduled for 9/23  Items for Follow-up on DC/TOC: Volume Medication optimization When he is able to return to work  Sharen Hones, PharmD, Field seismologist Phone [phone]

## 2023-08-11 NOTE — H&P (Signed)
History and Physical    Jesus [name] WUJ:[id number] DOB: 02-21-1941 DOA: 08/10/2023  PCP: Kristian Covey, MD   Patient coming from: Home   Chief Complaint: SOB, leg swelling   HPI: NA TROWER is a pleasant 82 y.o. male with medical history significant for CAD status post remote CABG and subsequent stents, PAF on Coumadin, severe AS status post TAVR, CKD stage II, and chronic combined systolic and diastolic CHF who presents with worsening exertional dyspnea and leg swelling.  Patient had increased leg swelling last week was given a 3-day course of Lasix on 08/03/2023.  His leg swelling resolved with this but he has since developed a recurrence in his leg swelling and insidiously worsening dyspnea.  He denies any associated chest pain, fever, or chills.  He denies any abdominal pain, melena, or hematochezia since the recent hospitalization.  ED Course: Upon arrival to the ED, patient is found to be afebrile and saturating mid 90s on room air with mildly elevated heart rate and stable blood pressure.  Chest x-ray notable for small bilateral pleural effusions.  Labs are most remarkable for creatinine 1.50, potassium 5.2, INR 2.4, and BNP 3549.  Patient was given 40 mg IV Lasix in the ED.  Review of Systems:  All other systems reviewed and apart from HPI, are negative.  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 S/P Ant MI 1980;  b. 1997 S/P CABG x 8 (LIMA to diag-LAD, SVG to OM1-OM2-OM3, SVG to [facility] - Dr Andrey Campanile);  c. 01/2015 Cath: 3VD, 8/8 patent grafts.   Cardiomyopathy    Dysrhythmia    Esophageal reflux    GERD (gastroesophageal reflux disease)    Headache    History of colonoscopy    History of transesophageal echocardiography (TEE) for  monitoring    Other and unspecified hyperlipidemia    Paroxysmal atrial fibrillation (HCC) 02/20/2022   S/P angioplasty with stent 02/17/22 DES to proximal VG to PDA 02/20/2022   S/P TAVR (transcatheter aortic valve replacement)    a. 03/2015 26 mm Edwards Sapien 3 transcatheter heart valve placed via open left transfemoral approach.   Severe aortic stenosis 08/10/2012   Skin lesions, generalized    facial which may represent actinic keratoses and possible photosensitivity from Amiodarone   Unspecified essential hypertension     Past Surgical History:  Procedure Laterality Date   CARDIAC CATHETERIZATION     CARDIOVERSION  11/18/2006   Dr. Jacklynn Bue   CATARACT EXTRACTION W/ INTRAOCULAR LENS IMPLANT  April '13  (Dr. Dagoberto Ligas)   left eye only   CHOLECYSTECTOMY     COLONOSCOPY WITH PROPOFOL N/A 07/11/2023   Procedure: COLONOSCOPY WITH PROPOFOL;  Surgeon: [name], MD;  Location: [facility] ENDOSCOPY;  Service: Gastroenterology;  Laterality: N/A;   CORONARY ARTERY BYPASS GRAFT  02/09/1996   LIMA to diag-LAD, SVG to OM1-OM2-OM3, SVG to [facility]   CORONARY STENT INTERVENTION N/A 02/17/2022   Procedure: CORONARY STENT INTERVENTION;  Surgeon: Kathleene Hazel, MD;  Location: [facility] INVASIVE CV LAB;  Service: Cardiovascular;  Laterality: N/A;   CORONARY/GRAFT ANGIOGRAPHY N/A 02/17/2022   Procedure: CORONARY/GRAFT ANGIOGRAPHY;  Surgeon: Kathleene Hazel, MD;  Location: [facility] INVASIVE CV LAB;  Service: Cardiovascular;  Laterality: N/A;  EYE SURGERY     HEMOSTASIS CLIP PLACEMENT  07/11/2023   Procedure: HEMOSTASIS CLIP PLACEMENT;  Surgeon: [name], MD;  Location: [facility] ENDOSCOPY;  Service: Gastroenterology;;   HEMOSTASIS CONTROL  07/11/2023   Procedure: HEMOSTASIS CONTROL;  Surgeon: [name], MD;  Location: [facility] ENDOSCOPY;  Service: Gastroenterology;;   LEFT AND RIGHT HEART CATHETERIZATION WITH CORONARY/GRAFT ANGIOGRAM N/A 01/26/2015   Procedure: LEFT AND RIGHT HEART CATHETERIZATION WITH  Isabel Caprice;  Surgeon: Micheline Chapman, MD;  Location: [facility] CATH LAB;  Service: Cardiovascular;  Laterality: N/A;   POLYPECTOMY  07/11/2023   Procedure: POLYPECTOMY;  Surgeon: [name], MD;  Location: [facility] ENDOSCOPY;  Service: Gastroenterology;;   TEE WITHOUT CARDIOVERSION N/A 03/10/2015   Procedure: TRANSESOPHAGEAL ECHOCARDIOGRAM (TEE);  Surgeon: [name], MD;  Location: [facility] OR;  Service: Open Heart Surgery;  Laterality: N/A;   TONSILLECTOMY     TRANSCATHETER AORTIC VALVE REPLACEMENT, TRANSFEMORAL N/A 03/10/2015   Procedure: TRANSCATHETER AORTIC VALVE REPLACEMENT, TRANSFEMORAL;  Surgeon: [name], MD;  Location: [facility] OR;  Service: Open Heart Surgery;  Laterality: N/A;    Social History:   reports that he has never smoked. He has never used smokeless tobacco. He reports that he does not drink alcohol and does not use drugs.  Allergies  Allergen Reactions   Amlodipine Other (See Comments)    Swelling?   Codeine Other (See Comments)    Pt does not remember   Iodine Swelling    Family History  Problem Relation Age of Onset   Stroke Mother 28       Deceased   Crohn's disease Mother        Deceased   Heart attack Father 35       Deceased   Heart disease Father    Heart failure Father        Deceased   Atrial fibrillation Brother    Heart disease Paternal Uncle    Prostate cancer Paternal Uncle    Colon cancer Neg Hx      Prior to Admission medications   Medication Sig Start Date End Date Taking? Authorizing Provider  acetaminophen (TYLENOL) 325 MG tablet Take 1-2 tablets (325-650 mg total) by mouth every 4 (four) hours as needed for mild pain. 03/15/22   Love, Evlyn Kanner, PA-C  amiodarone (PACERONE) 200 MG tablet Take 0.5 tablets (100 mg total) by mouth daily. 07/04/23   Duke Salvia, MD  amoxicillin (AMOXIL) 500 MG capsule TAKE FOUR CAPSULES BY MOUTH ONE hour prior TO dental procedure 06/30/23   [name], MD  Ascorbic Acid (VITAMIN C) 1000 MG tablet  Take 1,000 mg by mouth daily.    [provider]  aspirin EC 81 MG tablet Take 1 tablet (81 mg total) by mouth daily. Swallow whole. 06/15/23   [name], MD  atorvastatin (LIPITOR) 80 MG tablet Take 1 tablet (80 mg total) by mouth daily. 08/02/23   [name], Elberta Fortis, MD  ezetimibe (ZETIA) 10 MG tablet Take 1 tablet (10 mg total) by mouth daily. 10/05/22   Duke Salvia, MD  furosemide (LASIX) 20 MG tablet Take one tablet by mouth daily as needed for increased leg edema 08/02/23   Kristian Covey, MD  guaiFENesin-dextromethorphan (ROBITUSSIN DM) 100-10 MG/5ML syrup Take 10 mLs by mouth every 4 (four) hours as needed for cough. 07/16/23   Lanae Boast, MD  JARDIANCE 10 MG TABS tablet TAKE 1 TABLET BY MOUTH EVERY DAY 07/24/23   [name], Elberta Fortis, MD  metoprolol  tartrate (LOPRESSOR) 25 MG tablet Take 0.5 tablets (12.5 mg total) by mouth 2 (two) times daily. 08/02/23   [name], Elberta Fortis, MD  Multiple Vitamins-Minerals (MULTIVITAMIN,TX-MINERALS) tablet Take 1 tablet by mouth daily.    [provider]  nitroGLYCERIN (NITROSTAT) 0.4 MG SL tablet Place 1 tablet (0.4 mg total) under the tongue every 5 (five) minutes x 3 doses as needed for chest pain. 02/20/22   Leone Brand, NP  pantoprazole (PROTONIX) 40 MG tablet Take 1 tablet (40 mg total) by mouth daily. 07/16/23   Lanae Boast, MD  warfarin (COUMADIN) 4 MG tablet TAKE 1 TABLET BY MOUTH DAILY EXCEPT TAKE 1/2 TABLET ON MONDAYS AND FRIDAYS OR AS DIRECTED BY ANTICOAGULATION CLINIC 05/03/23   Kristian Covey, MD    Physical Exam: Vitals:   08/10/23 2356  BP: 115/77  Pulse: (!) 107  Resp: 19  Temp: 97.6 F (36.4 C)  SpO2: 95%    Constitutional: NAD, calm  Eyes: PERTLA, lids and conjunctivae normal ENMT: Mucous membranes are moist. Posterior pharynx clear of any exudate or lesions.   Neck: supple, no masses  Respiratory:  no wheezing, no crackles. No accessory muscle use.  Cardiovascular: S1 & S2 heard, regular rate and  rhythm. Pretibial pitting edema b/l.   Abdomen: No distension, no tenderness, soft. Bowel sounds active.  Musculoskeletal: no clubbing / cyanosis. No joint deformity upper and lower extremities.   Skin: no significant rashes, lesions, ulcers. Warm, dry, well-perfused. Neurologic: CN 2-12 grossly intact. Moving all extremities. Alert and oriented.  Psychiatric: Pleasant. Cooperative.    Labs and Imaging on Admission: I have personally reviewed following labs and imaging studies  CBC: Recent Labs  Lab 08/11/23 0016  WBC 9.7  HGB 12.5*  HCT 38.4*  MCV 103.2*  PLT 187   Basic Metabolic Panel: Recent Labs  Lab 08/11/23 0016  NA 135  K 5.2*  CL 100  CO2 22  GLUCOSE 128*  BUN 26*  CREATININE 1.50*  CALCIUM 8.7*   GFR: Estimated Creatinine Clearance: 27.1 mL/min (A) (by C-G formula based on SCr of 1.5 mg/dL (H)). Liver Function Tests: Recent Labs  Lab 08/11/23 0016  AST 36  ALT 47*  ALKPHOS 97  BILITOT 1.3*  PROT 6.7  ALBUMIN 3.3*   No results for input(s): "LIPASE", "AMYLASE" in the last 168 hours. No results for input(s): "AMMONIA" in the last 168 hours. Coagulation Profile: Recent Labs  Lab 08/11/23 0155  INR 2.4*   Cardiac Enzymes: No results for input(s): "CKTOTAL", "CKMB", "CKMBINDEX", "TROPONINI" in the last 168 hours. BNP (last 3 results) No results for input(s): "PROBNP" in the last 8760 hours. HbA1C: No results for input(s): "HGBA1C" in the last 72 hours. CBG: No results for input(s): "GLUCAP" in the last 168 hours. Lipid Profile: No results for input(s): "CHOL", "HDL", "LDLCALC", "TRIG", "CHOLHDL", "LDLDIRECT" in the last 72 hours. Thyroid Function Tests: No results for input(s): "TSH", "T4TOTAL", "FREET4", "T3FREE", "THYROIDAB" in the last 72 hours. Anemia Panel: No results for input(s): "VITAMINB12", "FOLATE", "FERRITIN", "TIBC", "IRON", "RETICCTPCT" in the last 72 hours. Urine analysis:    Component Value Date/Time   COLORURINE AMBER (A)  07/12/2023 0907   APPEARANCEUR HAZY (A) 07/12/2023 0907   LABSPEC 1.021 07/12/2023 0907   PHURINE 5.0 07/12/2023 0907   GLUCOSEU NEGATIVE 07/12/2023 0907   GLUCOSEU NEGATIVE 01/29/2010 1351   HGBUR NEGATIVE 07/12/2023 0907   BILIRUBINUR NEGATIVE 07/12/2023 0907   BILIRUBINUR n 08/27/2014 1657   KETONESUR 5 (A) 07/12/2023 1610  PROTEINUR 30 (A) 07/12/2023 0907   UROBILINOGEN 1.0 03/06/2015 1446   NITRITE NEGATIVE 07/12/2023 0907   LEUKOCYTESUR NEGATIVE 07/12/2023 0907   Sepsis Labs: @LABRCNTIP (procalcitonin:4,lacticidven:4) )No results found for this or any previous visit (from the past 240 hour(s)).   Radiological Exams on Admission: DG Chest 2 View  Result Date: 08/11/2023 CLINICAL DATA:  Shortness of breath EXAM: CHEST - 2 VIEW COMPARISON:  07/12/2023 FINDINGS: Prior CABG. Heart is borderline in size. Mediastinal contours within normal limits. Aortic atherosclerosis. Small bilateral effusions with bibasilar opacities, likely atelectasis. IMPRESSION: Borderline heart size. Small bilateral effusions with bibasilar atelectasis. Electronically Signed   By: Charlett Nose M.D.   On: 08/11/2023 01:06     Assessment/Plan  1. Acute on chronic combined systolic & diastolic CHF  - EF was 35-40% with grade 2 diastolic dysfunction on echo from March 2024  - Continue diuresis with 40 mg IV Lasix q12h, monitor weight and I/Os, monitor electrolytes and renal function    2. AKI superimposed on CKD II  - Renally-dose medications, follow closely while diuresing    3. PAF  - Continue warfarin, amiodarone, and metoprolol    4. CAD - No anginal symptoms   - Continue ASA, Lipitor, metoprolol     DVT prophylaxis: Warfarin  Code Status: DNR  Level of Care: Level of care: Telemetry Cardiac Family Communication: Niece at bedside   Disposition Plan:  Patient is from: home  Anticipated d/c is to: TBD Anticipated d/c date is: 08/15/23  Patient currently: Pending improved volume status, stable  renal function  Consults called: none  Admission status: Inpatient     Briscoe Deutscher, MD [facility]  08/11/2023, 2:55 AM

## 2023-08-11 NOTE — Progress Notes (Signed)
Left a message for the patient to return my call.  

## 2023-08-12 ENCOUNTER — Inpatient Hospital Stay (HOSPITAL_COMMUNITY): Payer: Medicare Other

## 2023-08-12 DIAGNOSIS — I5043 Acute on chronic combined systolic (congestive) and diastolic (congestive) heart failure: Secondary | ICD-10-CM | POA: Diagnosis not present

## 2023-08-12 DIAGNOSIS — I5021 Acute systolic (congestive) heart failure: Secondary | ICD-10-CM | POA: Diagnosis not present

## 2023-08-12 LAB — ECHOCARDIOGRAM COMPLETE
AR max vel: 1.1 cm2
AV Area VTI: 1.08 cm2
AV Area mean vel: 1.12 cm2
AV Mean grad: 8.3 mmHg
AV Peak grad: 17.3 mmHg
Ao pk vel: 2.08 m/s
Est EF: <20
Height: 61 in
S' Lateral: 5.5 cm
Weight: 1769.6 [oz_av]

## 2023-08-12 LAB — BASIC METABOLIC PANEL
Anion gap: 11 (ref 5–15)
BUN: 28 mg/dL — ABNORMAL HIGH (ref 8–23)
CO2: 27 mmol/L (ref 22–32)
Calcium: 8.3 mg/dL — ABNORMAL LOW (ref 8.9–10.3)
Chloride: 97 mmol/L — ABNORMAL LOW (ref 98–111)
Creatinine, Ser: 1.55 mg/dL — ABNORMAL HIGH (ref 0.61–1.24)
GFR, Estimated: 44 mL/min — ABNORMAL LOW (ref >60)
Glucose, Bld: 97 mg/dL (ref 70–99)
Potassium: 3.4 mmol/L — ABNORMAL LOW (ref 3.5–5.1)
Sodium: 135 mmol/L (ref 135–145)

## 2023-08-12 LAB — PROTIME-INR
INR: 1.9 — ABNORMAL HIGH (ref 0.8–1.2)
Prothrombin Time: 21.9 seconds — ABNORMAL HIGH (ref 11.4–15.2)

## 2023-08-12 LAB — CBC
HCT: 37.6 % — ABNORMAL LOW (ref 39.0–52.0)
Hemoglobin: 12.5 g/dL — ABNORMAL LOW (ref 13.0–17.0)
MCH: 33.2 pg (ref 26.0–34.0)
MCHC: 33.2 g/dL (ref 30.0–36.0)
MCV: 100 fL (ref 80.0–100.0)
Platelets: 167 10*3/uL (ref 150–400)
RBC: 3.76 MIL/uL — ABNORMAL LOW (ref 4.22–5.81)
RDW: 14.6 % (ref 11.5–15.5)
WBC: 8.1 10*3/uL (ref 4.0–10.5)
nRBC: 0 % (ref 0.0–0.2)

## 2023-08-12 MED ORDER — LIDOCAINE 5 % EX PTCH
1.0000 | MEDICATED_PATCH | CUTANEOUS | Status: DC
  Administered 2023-08-12 – 2023-08-13 (×2): 1 via TRANSDERMAL
  Filled 2023-08-12 (×2): qty 1

## 2023-08-12 MED ORDER — POTASSIUM CHLORIDE 20 MEQ PO PACK
40.0000 meq | PACK | Freq: Once | ORAL | Status: AC
  Administered 2023-08-12: 40 meq via ORAL
  Filled 2023-08-12: qty 2

## 2023-08-12 MED ORDER — WARFARIN SODIUM 2 MG PO TABS
4.0000 mg | ORAL_TABLET | Freq: Once | ORAL | Status: AC
  Administered 2023-08-12: 4 mg via ORAL
  Filled 2023-08-12: qty 2

## 2023-08-12 MED ORDER — POTASSIUM CHLORIDE CRYS ER 20 MEQ PO TBCR
40.0000 meq | EXTENDED_RELEASE_TABLET | Freq: Every day | ORAL | Status: DC
  Filled 2023-08-12: qty 2

## 2023-08-12 NOTE — Progress Notes (Signed)
  Echocardiogram 2D Echocardiogram has been performed.  Delcie Roch 08/12/2023, 12:10 PM

## 2023-08-12 NOTE — Progress Notes (Signed)
PROGRESS NOTE    Jesus [name]  ZOX:[id number] DOB: 11/07/1941 DOA: 08/10/2023 PCP: Kristian Covey, MD    Brief Narrative:  82 year old man with history of coronary artery disease and remote CABG, PAF on Coumadin, severe aortic stenosis status post TAVR, CKD stage II, chronic combined heart failure with known ejection fraction 35% presented with about 3 days of exertional dyspnea and leg swelling despite increasing dose of Lasix at home. In the emergency room on room air. Blood pressures stable. Chest x-ray with small bilateral pleural effusion. Creatinine 1.5. Potassium 5.2. BNP 3549. Started on Lasix and admitted to the hospital.    Assessment & Plan:   Acute on chronic combined heart failure: Known EF of 35 to 40% with grade 2 diastolic dysfunction from 02/2023.  Continue diuresis 40 mg IV Lasix every 12 hours, intake and output monitoring, electrolyte monitoring renal function test monitoring and urine output monitoring.  Clinically improving.   Echocardiogram pending.  Mobilize with PT OT.    AKI on CKD stage IIIa: Patient does have baseline creatinine about 1.2-1.3.  Presented with creatinine 1.5-1.7-trending down.  Will continue diuresis and monitor levels.  Recheck tomorrow morning.  Hypokalemia: Replace and monitor levels.  Paroxysmal A-fib: INR 1.9.  Sinus rhythm on amiodarone and metoprolol.  Tolerating.  Coronary artery disease: Stable.  On aspirin, Lipitor, metoprolol.   DVT prophylaxis:  warfarin (COUMADIN) tablet 4 mg   Code Status: DNR with limited intervention Family Communication: Patient's niece Ms. Vonita Moss called and updated. Disposition Plan: Status is: Inpatient Remains inpatient appropriate because: Fluid overload, IV diuresis     Consultants:  None  Procedures:  None  Antimicrobials:  None   Subjective: Patient seen in the morning rounds.  Poor historian but denies any complaints.  He tells me he is breathing better and moving around better  today.  Objective: Vitals:   08/11/23 1937 08/12/23 0843 08/12/23 0853 08/12/23 1246  BP: 109/77 110/64  93/72  Pulse: (!) 109 (!) 108  94  Resp: 20 20    Temp: 98.1 F (36.7 C) 98.7 F (37.1 C)  98.5 F (36.9 C)  TempSrc: Oral Oral  Oral  SpO2: 95%     Weight:   50.2 kg   Height:        Intake/Output Summary (Last 24 hours) at 08/12/2023 1515 Last data filed at 08/12/2023 0921 Gross per 24 hour  Intake 600 ml  Output 875 ml  Net -275 ml   Filed Weights   08/11/23 0456 08/12/23 0853  Weight: 52.2 kg 50.2 kg    Examination:  General exam: Appears calm and comfortable  Age-appropriate.  Looks comfortable on room air. Respiratory system: No added sounds. Cardiovascular system: S1 & S2 heard, RRR.  Trace bilateral pedal edema. Gastrointestinal system: Abdomen is nondistended, soft and nontender. No organomegaly or masses felt. Normal bowel sounds heard. Central nervous system: Alert and oriented. No focal neurological deficits. Extremities: Symmetric 5 x 5 power.    Data Reviewed: I have personally reviewed following labs and imaging studies  CBC: Recent Labs  Lab 08/11/23 0016 08/11/23 0934 08/12/23 0406  WBC 9.7 8.1 8.1  HGB 12.5* 12.9* 12.5*  HCT 38.4* 39.6 37.6*  MCV 103.2* 99.2 100.0  PLT 187 186 167   Basic Metabolic Panel: Recent Labs  Lab 08/11/23 0016 08/11/23 0934 08/12/23 0406  NA 135 137 135  K 5.2* 4.5 3.4*  CL 100 101 97*  CO2 22 27 27   GLUCOSE 128* 139* 97  BUN  26* 26* 28*  CREATININE 1.50* 1.70* 1.55*  CALCIUM 8.7* 8.6* 8.3*  MG  --  2.2  --    GFR: Estimated Creatinine Clearance: 25.2 mL/min (A) (by C-G formula based on SCr of 1.55 mg/dL (H)). Liver Function Tests: Recent Labs  Lab 08/11/23 0016 08/11/23 0934  AST 36 34  ALT 47* 46*  ALKPHOS 97 97  BILITOT 1.3* 1.3*  PROT 6.7 6.8  ALBUMIN 3.3* 3.3*   No results for input(s): "LIPASE", "AMYLASE" in the last 168 hours. No results for input(s): "AMMONIA" in the last 168  hours. Coagulation Profile: Recent Labs  Lab 08/11/23 0155 08/12/23 0843  INR 2.4* 1.9*   Cardiac Enzymes: No results for input(s): "CKTOTAL", "CKMB", "CKMBINDEX", "TROPONINI" in the last 168 hours. BNP (last 3 results) No results for input(s): "PROBNP" in the last 8760 hours. HbA1C: No results for input(s): "HGBA1C" in the last 72 hours. CBG: No results for input(s): "GLUCAP" in the last 168 hours. Lipid Profile: No results for input(s): "CHOL", "HDL", "LDLCALC", "TRIG", "CHOLHDL", "LDLDIRECT" in the last 72 hours. Thyroid Function Tests: No results for input(s): "TSH", "T4TOTAL", "FREET4", "T3FREE", "THYROIDAB" in the last 72 hours. Anemia Panel: No results for input(s): "VITAMINB12", "FOLATE", "FERRITIN", "TIBC", "IRON", "RETICCTPCT" in the last 72 hours. Sepsis Labs: No results for input(s): "PROCALCITON", "LATICACIDVEN" in the last 168 hours.  No results found for this or any previous visit (from the past 240 hour(s)).       Radiology Studies: DG Chest 2 View  Result Date: 08/11/2023 CLINICAL DATA:  Shortness of breath EXAM: CHEST - 2 VIEW COMPARISON:  07/12/2023 FINDINGS: Prior CABG. Heart is borderline in size. Mediastinal contours within normal limits. Aortic atherosclerosis. Small bilateral effusions with bibasilar opacities, likely atelectasis. IMPRESSION: Borderline heart size. Small bilateral effusions with bibasilar atelectasis. Electronically Signed   By: Charlett Nose M.D.   On: 08/11/2023 01:06        Scheduled Meds:  amiodarone  100 mg Oral Daily   aspirin EC  81 mg Oral Daily   atorvastatin  80 mg Oral Daily   furosemide  40 mg Intravenous BID   lidocaine  1 patch Transdermal Q24H   metoprolol tartrate  12.5 mg Oral BID   pantoprazole  40 mg Oral Daily   sodium chloride flush  3 mL Intravenous Q12H   warfarin  4 mg Oral ONCE-1600   Warfarin - Pharmacist Dosing Inpatient   Does not apply q1600   Continuous Infusions:   LOS: 1 day    Time spent:  35 minutes    Dorcas Carrow, MD [facility]

## 2023-08-12 NOTE — Progress Notes (Addendum)
ANTICOAGULATION CONSULT NOTE   Pharmacy Consult for Warfarin Indication: atrial fibrillation  Allergies  Allergen Reactions   Amlodipine Other (See Comments)    Swelling?   Codeine Other (See Comments)    Pt does not remember   Iodine Swelling   Vital Signs: Temp: 98.7 F (37.1 C) (09/07 0843) Temp Source: Oral (09/07 0843) BP: 110/64 (09/07 0843) Pulse Rate: 108 (09/07 0843)  Labs: Recent Labs    08/11/23 0016 08/11/23 0155 08/11/23 0934 08/12/23 0406 08/12/23 0843  HGB 12.5*  --  12.9* 12.5*  --   HCT 38.4*  --  39.6 37.6*  --   PLT 187  --  186 167  --   LABPROT  --  26.0*  --   --  21.9*  INR  --  2.4*  --   --  1.9*  CREATININE 1.50*  --  1.70* 1.55*  --   TROPONINIHS 20* 20*  --   --   --     Estimated Creatinine Clearance: 25.2 mL/min (A) (by C-G formula based on SCr of 1.55 mg/dL (H)).  Assessment: Jesus [name] is a 82 y.o. year old male admitted on 08/10/2023 with concern for HF exacerbation. On warfarin prior to admission, last dose PTA 9/5. Prior to admission warfarin regimen 2 mg every Mon, Wed, Fri; 4 mg all other days. Noted patient was also on amiodarone PTA, this has been continued in the hospital.   9/7 INR 1.9, subtherapeutic.   Goal of Therapy:  INR 2-3 Monitor platelets by anticoagulation protocol: Yes   Plan:  Plan for warfarin 4mg  today  F/u daily INR and bleeding assessments  Enos Fling, PharmD PGY-1 Acute Care Pharmacy Resident 08/12/2023 9:52 AM

## 2023-08-13 DIAGNOSIS — Z952 Presence of prosthetic heart valve: Secondary | ICD-10-CM

## 2023-08-13 DIAGNOSIS — Z7189 Other specified counseling: Secondary | ICD-10-CM

## 2023-08-13 DIAGNOSIS — Z515 Encounter for palliative care: Secondary | ICD-10-CM

## 2023-08-13 DIAGNOSIS — I480 Paroxysmal atrial fibrillation: Secondary | ICD-10-CM | POA: Diagnosis not present

## 2023-08-13 DIAGNOSIS — I5043 Acute on chronic combined systolic (congestive) and diastolic (congestive) heart failure: Secondary | ICD-10-CM | POA: Diagnosis not present

## 2023-08-13 LAB — BASIC METABOLIC PANEL
Anion gap: 16 — ABNORMAL HIGH (ref 5–15)
BUN: 35 mg/dL — ABNORMAL HIGH (ref 8–23)
CO2: 27 mmol/L (ref 22–32)
Calcium: 8.4 mg/dL — ABNORMAL LOW (ref 8.9–10.3)
Chloride: 95 mmol/L — ABNORMAL LOW (ref 98–111)
Creatinine, Ser: 1.67 mg/dL — ABNORMAL HIGH (ref 0.61–1.24)
GFR, Estimated: 40 mL/min — ABNORMAL LOW (ref >60)
Glucose, Bld: 98 mg/dL (ref 70–99)
Potassium: 3.4 mmol/L — ABNORMAL LOW (ref 3.5–5.1)
Sodium: 138 mmol/L (ref 135–145)

## 2023-08-13 LAB — CBC
HCT: 36.8 % — ABNORMAL LOW (ref 39.0–52.0)
Hemoglobin: 12.4 g/dL — ABNORMAL LOW (ref 13.0–17.0)
MCH: 32.8 pg (ref 26.0–34.0)
MCHC: 33.7 g/dL (ref 30.0–36.0)
MCV: 97.4 fL (ref 80.0–100.0)
Platelets: 175 10*3/uL (ref 150–400)
RBC: 3.78 MIL/uL — ABNORMAL LOW (ref 4.22–5.81)
RDW: 14.6 % (ref 11.5–15.5)
WBC: 7.2 10*3/uL (ref 4.0–10.5)
nRBC: 0 % (ref 0.0–0.2)

## 2023-08-13 LAB — PROTIME-INR
INR: 2.2 — ABNORMAL HIGH (ref 0.8–1.2)
Prothrombin Time: 24.5 s — ABNORMAL HIGH (ref 11.4–15.2)

## 2023-08-13 MED ORDER — POTASSIUM CHLORIDE CRYS ER 20 MEQ PO TBCR
20.0000 meq | EXTENDED_RELEASE_TABLET | Freq: Two times a day (BID) | ORAL | Status: DC
  Administered 2023-08-13 (×2): 20 meq via ORAL
  Filled 2023-08-13 (×3): qty 1

## 2023-08-13 MED ORDER — AMIODARONE HCL 200 MG PO TABS
200.0000 mg | ORAL_TABLET | Freq: Every day | ORAL | Status: DC
  Administered 2023-08-14: 200 mg via ORAL
  Filled 2023-08-13: qty 1

## 2023-08-13 MED ORDER — WARFARIN SODIUM 2 MG PO TABS: 4.0000 mg | ORAL_TABLET | Freq: Once | ORAL | Status: DC

## 2023-08-13 MED ORDER — LISINOPRIL 5 MG PO TABS
2.5000 mg | ORAL_TABLET | Freq: Every day | ORAL | Status: DC
  Administered 2023-08-13 – 2023-08-14 (×2): 2.5 mg via ORAL
  Filled 2023-08-13 (×2): qty 1

## 2023-08-13 MED ORDER — ENSURE ENLIVE PO LIQD
237.0000 mL | Freq: Two times a day (BID) | ORAL | Status: DC
  Administered 2023-08-13 – 2023-08-14 (×3): 237 mL via ORAL

## 2023-08-13 MED ORDER — WARFARIN SODIUM 2 MG PO TABS
4.0000 mg | ORAL_TABLET | Freq: Once | ORAL | Status: AC
  Administered 2023-08-13: 4 mg via ORAL
  Filled 2023-08-13: qty 2

## 2023-08-13 MED ORDER — WARFARIN - PHARMACIST DOSING INPATIENT: Freq: Every day | Status: DC

## 2023-08-13 NOTE — Evaluation (Signed)
Physical Therapy Evaluation Patient Details Name: WYITT SUDLER MRN: [id number] DOB: 29-Dec-1940 Today's Date: 08/13/2023  History of Present Illness  Pt is an 82 y/o M presenting to ED on 9/5 with worsening LE edema/dyspnea, admitted for acute on chronic combined systolic and diastolic CHF. PMH includes CAD s/p CABG and subsequent stents, PAF on coumadin, severe AS s/p TAVR, CKD II, chronic combined systolic and diastolic CHF  Clinical Impression  Pt admitted with above diagnosis. Pt was able to ambulate in hallway and without device, needs contact guard assist to min assist if challenged.  Pt aware that PT recommends him use cane at minimum and ultimately use RW for safety. Pt reports he has both devices at home. REcommend HHPT as well.  Will follow acutely.  Pt currently with functional limitations due to the deficits listed below (see PT Problem List). Pt will benefit from acute skilled PT to increase their independence and safety with mobility to allow discharge.           If plan is discharge home, recommend the following: A little help with walking and/or transfers;Assistance with cooking/housework;Help with stairs or ramp for entrance   Can travel by private vehicle        Equipment Recommendations None recommended by PT  Recommendations for Other Services       Functional Status Assessment Patient has had a recent decline in their functional status and demonstrates the ability to make significant improvements in function in a reasonable and predictable amount of time.     Precautions / Restrictions Precautions Precautions: Fall Restrictions Weight Bearing Restrictions: No      Mobility  Bed Mobility               General bed mobility comments: seated  in chair    Transfers Overall transfer level: Needs assistance Equipment used: Rolling walker (2 wheels) Transfers: Sit to/from Stand Sit to Stand: Contact guard assist           General transfer comment:  Needed cues for hand placement    Ambulation/Gait Ambulation/Gait assistance: Min assist Gait Distance (Feet): 150 Feet Assistive device: None Gait Pattern/deviations: Step-through pattern, Decreased stride length, Trunk flexed, Drifts right/left   Gait velocity interpretation: <1.31 ft/sec, indicative of household ambulator   General Gait Details: Without device, pt veers left and at least 2x needing steadying assist with incr ambulation.  Pt does better when having at least 1 Ue support. Pt encouraged to use RW for ultimate safety. Also discussed use of cane which would provide incr support for pt.  Pt understands PT recommends cane at minimum and ultimately recommends RW for safety upon d/c.  Stairs            Wheelchair Mobility     Tilt Bed    Modified Rankin (Stroke Patients Only)       Balance Overall balance assessment: Needs assistance Sitting-balance support: Feet supported, No upper extremity supported Sitting balance-Leahy Scale: Good     Standing balance support: During functional activity Standing balance-Leahy Scale: Fair Standing balance comment: RW support recommended however pt can stand statically without UE support                             Pertinent Vitals/Pain Pain Assessment Pain Assessment: No/denies pain    Home Living Family/patient expects to be discharged to:: Private residence Living Arrangements: Alone Available Help at Discharge: Family;Available PRN/intermittently (family, sister in law) Type of  Home: House Home Access: Stairs to enter Entrance Stairs-Rails: Left Entrance Stairs-Number of Steps: 1 (if entering from garage)   Home Layout: Two level;Other (Comment);Able to live on main level with bedroom/bathroom (full basement) Home Equipment: Cane - single point;Shower seat - built in;Grab bars - Chartered loss adjuster (2 wheels) Additional Comments: 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    Prior Function Prior Level of Function : Independent/Modified Independent;Driving             Mobility Comments: uses RW ADLs Comments: ind, does yard work     Radio producer Extremity Assessment Upper Extremity Assessment: Defer to OT evaluation    Lower Extremity Assessment Lower Extremity Assessment: Generalized weakness    Cervical / Trunk Assessment Cervical / Trunk Assessment: Kyphotic  Communication   Communication Communication: Hearing impairment  Cognition Arousal: Alert Behavior During Therapy: WFL for tasks assessed/performed Overall Cognitive Status: No family/caregiver present to determine baseline cognitive functioning                                 General Comments: HOH vs cognition, oriented x3, incr cues for situation, follows commands appropriately. Had initially reported he ate breakfast, but then breakfast tray arriving and pt reports he has not eaten yet        General Comments General comments (skin integrity, edema, etc.): HR 84-121 bpm.    Exercises General Exercises - Lower Extremity Ankle Circles/Pumps: AROM, Both, 10 reps, Seated Long Arc Quad: AROM, Both, 10 reps, Seated   Assessment/Plan    PT Assessment Patient needs continued PT services  PT Problem List Decreased activity tolerance;Decreased balance;Decreased mobility;Decreased knowledge of use of DME;Decreased safety awareness;Decreased knowledge of precautions;Cardiopulmonary status limiting activity       PT Treatment Interventions DME instruction;Gait training;Functional mobility training;Therapeutic activities;Therapeutic exercise;Balance training;Patient/family education    PT Goals (Current goals can be found in the Care Plan section)  Acute Rehab PT Goals Patient Stated Goal: to go home PT Goal Formulation: With patient Time For Goal Achievement: 08/27/23 Potential to Achieve Goals: Good    Frequency Min 1X/week      Co-evaluation               AM-PAC PT "6 Clicks" Mobility  Outcome Measure Help needed turning from your back to your side while in a flat bed without using bedrails?: A Little Help needed moving from lying on your back to sitting on the side of a flat bed without using bedrails?: A Little Help needed moving to and from a bed to a chair (including a wheelchair)?: A Little Help needed standing up from a chair using your arms (e.g., wheelchair or bedside chair)?: A Little Help needed to walk in hospital room?: A Little Help needed climbing 3-5 steps with a railing? : A Little 6 Click Score: 18    End of Session Equipment Utilized During Treatment: Gait belt Activity Tolerance: Patient limited by fatigue Patient left: in chair;with call bell/phone within reach;with chair alarm set Nurse Communication: Mobility status PT Visit Diagnosis: Unsteadiness on feet (R26.81);Muscle weakness (generalized) (M62.81)    Time: 1610-9604 PT Time Calculation (min) (ACUTE ONLY): 11 min   Charges:   PT Evaluation $PT Eval Moderate Complexity: 1 Mod   PT General Charges $$ ACUTE PT VISIT: 1 Visit         Kendrick Haapala M,PT Acute Rehab Services [phone]  Bevelyn Buckles 08/13/2023, 3:32 PM

## 2023-08-13 NOTE — Plan of Care (Signed)
Patient has been stable, vitals have been stable. Will continue to monitor patient.

## 2023-08-13 NOTE — Progress Notes (Signed)
   Palliative Medicine Inpatient Follow Up Note  The PMT has acknowledged the consultation for Jesus [name].  I have called patients niece, Synetta Fail.  Plan for meeting tomorrow (08/14/23) at 11AM, formal consultation note thereafter.  No Charge ______________________________________________________________________________________ Lamarr Lulas [place] Palliative Medicine Team Team Cell Phone: [phone] Please utilize secure chat with additional questions, if there is no response within 30 minutes please call the above phone number  Palliative Medicine Team providers are available by phone from 7am to 7pm daily and can be reached through the team cell phone.  Should this patient require assistance outside of these hours, please call the patient's attending physician.

## 2023-08-13 NOTE — Consult Note (Signed)
CARDIOLOGY CONSULT NOTE       Patient ID: JAZER RIEDE MRN: [id number] DOB/AGE: 12-Oct-1941 82 y.o.  Admit date: 08/10/2023 Referring Physician: Jerral Ralph Primary Physician: Kristian Covey, MD Primary Cardiologist: Excell Seltzer Reason for Consultation: CHF  Principal Problem:   Acute on chronic combined systolic and diastolic CHF (congestive heart failure) (HCC) Active Problems:   CAD, AUTOLOGOUS BYPASS GRAFT   Acute renal failure 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 (HCC)   A-fib (HCC)   HPI:  82 y.o. with history of ischemic DCM. Distant CABG Last cath 02/17/22 with stenting of the SVG to PDA. Sequential graft OM1/OM2 patent and patent LIMA to LAD. Remote anterior MI. TTE done 02/16/22 EF 35-40% post TAVR.April 2016  with 26 mm Sapien Known mild PVL stable gradients mean 8 peak 17 mmHg on current TTE. TTE 08/12/23 showed worsening LV function now 20% with worsening RWMA in anterior and inferior walls. He has had very poor functional status last 2 months. 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 He lives alone with no kids Spoke with his sister and brother in law today. He was admitted 9/6 with more LE edema and dyspnea CXR with small bilateral effusions and BNP 3549.  Feels better after 48 hours of diuresis.  He has not had any SSCP  Troponin negative x 2. ECG with chronic afib LBBB. He has been on coumadin for his afib and INR Rx at 2.2 His ECG in July and August showed NSR with sinus bradycardia.   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 S/P Ant MI 1980;  b. 1997 S/P  CABG x 8 (LIMA to diag-LAD, SVG to OM1-OM2-OM3, SVG to [facility] - Dr Andrey Campanile);  c. 01/2015 Cath: 3VD, 8/8 patent grafts.   Cardiomyopathy    Dysrhythmia    Esophageal reflux    GERD (gastroesophageal reflux disease)    Headache    History of colonoscopy    History of transesophageal echocardiography (TEE) for monitoring    Other and unspecified hyperlipidemia    Paroxysmal atrial fibrillation (HCC) 02/20/2022   S/P angioplasty with stent 02/17/22 DES to proximal VG to PDA 02/20/2022   S/P TAVR (transcatheter aortic valve replacement)    a. 03/2015 26 mm Edwards Sapien 3 transcatheter heart valve placed via open left transfemoral approach.   Severe aortic stenosis 08/10/2012   Skin lesions, generalized    facial which may represent actinic keratoses and possible photosensitivity from Amiodarone   Unspecified essential hypertension     Family History  Problem Relation Age of Onset   Stroke Mother 31       Deceased   Crohn's disease Mother        Deceased   Heart attack Father 42       Deceased   Heart disease Father    Heart failure Father        Deceased   Atrial fibrillation Brother    Heart disease Paternal Uncle    Prostate cancer Paternal Uncle    Colon cancer Neg Hx  Social History   Socioeconomic History   Marital status: Married    Spouse name: Not on file   Number of children: Not on file   Years of education: Not on file   Highest education level: Not on file  Occupational History   Not on file  Tobacco Use   Smoking status: Never   Smokeless tobacco: Never   Tobacco comments:    Does not smoke.  Vaping Use   Vaping status: Never Used  Substance and Sexual Activity   Alcohol use: No    Alcohol/week: 0.0 standard drinks of alcohol   Drug use: No   Sexual activity: Not Currently  Other Topics Concern   Not on file  Social History Narrative   HSG. [employer] - [employer] 6 years. Married - '69 - 1 year/divorced. '76 - . No children. Work - mfg/textiles -  Audiological scientist; currently works doing maintenance. [employer] - they have discussed this. Provided packet august '13.                Social Determinants of Health   Financial Resource Strain: Low Risk  (08/03/2023)   Overall Financial Resource Strain (CARDIA)    Difficulty of Paying Living Expenses: Not hard at all  Food Insecurity: No Food Insecurity (08/11/2023)   Hunger Vital Sign    Worried About Running Out of Food in the Last Year: Never true    Ran Out of Food in the Last Year: Never true  Transportation Needs: No Transportation Needs (08/11/2023)   PRAPARE - Administrator, Civil Service (Medical): No    Lack of Transportation (Non-Medical): No  Physical Activity: Sufficiently Active (08/03/2023)   Exercise Vital Sign    Days of Exercise per Week: 5 days    Minutes of Exercise per Session: 60 min  Stress: No Stress Concern Present (08/03/2023)   [employer] of Occupational Health - Occupational Stress Questionnaire    Feeling of Stress : Not at all  Social Connections: Socially Integrated (08/03/2023)   Social Connection and Isolation Panel [NHANES]    Frequency of Communication with Friends and Family: More than three times a week    Frequency of Social Gatherings with Friends and Family: More than three times a week    Attends Religious Services: More than 4 times per year    Active Member of Clubs or Organizations: Yes    Attends Banker Meetings: More than 4 times per year    Marital Status: Married  Catering manager Violence: Not At Risk (08/11/2023)   Humiliation, Afraid, Rape, and Kick questionnaire    Fear of Current or Ex-Partner: No    Emotionally Abused: No    Physically Abused: No    Sexually Abused: No    Past Surgical History:  Procedure Laterality Date   CARDIAC CATHETERIZATION     CARDIOVERSION  11/18/2006   Dr. Jacklynn Bue   CATARACT EXTRACTION W/ INTRAOCULAR LENS IMPLANT  April '13  (Dr. Dagoberto Ligas)   left eye only    CHOLECYSTECTOMY     COLONOSCOPY WITH PROPOFOL N/A 07/11/2023   Procedure: COLONOSCOPY WITH PROPOFOL;  Surgeon: [name], MD;  Location: [facility] ENDOSCOPY;  Service: Gastroenterology;  Laterality: N/A;   CORONARY ARTERY BYPASS GRAFT  02/09/1996   LIMA to diag-LAD, SVG to OM1-OM2-OM3, SVG to [facility]   CORONARY STENT INTERVENTION N/A 02/17/2022   Procedure: CORONARY STENT INTERVENTION;  Surgeon: Kathleene Hazel, MD;  Location: [facility] INVASIVE CV LAB;  Service: Cardiovascular;  Laterality: N/A;   CORONARY/GRAFT ANGIOGRAPHY N/A 02/17/2022   Procedure: CORONARY/GRAFT ANGIOGRAPHY;  Surgeon: Kathleene Hazel, MD;  Location: [facility] INVASIVE CV LAB;  Service: Cardiovascular;  Laterality: N/A;   EYE SURGERY     HEMOSTASIS CLIP PLACEMENT  07/11/2023   Procedure: HEMOSTASIS CLIP PLACEMENT;  Surgeon: [name], MD;  Location: [facility] ENDOSCOPY;  Service: Gastroenterology;;   HEMOSTASIS CONTROL  07/11/2023   Procedure: HEMOSTASIS CONTROL;  Surgeon: [name], MD;  Location: [facility] ENDOSCOPY;  Service: Gastroenterology;;   LEFT AND RIGHT HEART CATHETERIZATION WITH CORONARY/GRAFT ANGIOGRAM N/A 01/26/2015   Procedure: LEFT AND RIGHT HEART CATHETERIZATION WITH Isabel Caprice;  Surgeon: Micheline Chapman, MD;  Location: [facility] CATH LAB;  Service: Cardiovascular;  Laterality: N/A;   POLYPECTOMY  07/11/2023   Procedure: POLYPECTOMY;  Surgeon: [name], MD;  Location: [facility] ENDOSCOPY;  Service: Gastroenterology;;   TEE WITHOUT CARDIOVERSION N/A 03/10/2015   Procedure: TRANSESOPHAGEAL ECHOCARDIOGRAM (TEE);  Surgeon: [name], MD;  Location: [facility] OR;  Service: Open Heart Surgery;  Laterality: N/A;   TONSILLECTOMY     TRANSCATHETER AORTIC VALVE REPLACEMENT, TRANSFEMORAL N/A 03/10/2015   Procedure: TRANSCATHETER AORTIC VALVE REPLACEMENT, TRANSFEMORAL;  Surgeon: [name], MD;  Location: [facility] OR;  Service: Open Heart Surgery;  Laterality: N/A;      Current Facility-Administered Medications:     acetaminophen (TYLENOL) tablet 650 mg, 650 mg, Oral, Q6H PRN, 650 mg at 08/12/23 1829 **OR** acetaminophen (TYLENOL) suppository 650 mg, 650 mg, Rectal, Q6H PRN, Opyd, Lavone Neri, MD   amiodarone (PACERONE) tablet 100 mg, 100 mg, Oral, Daily, Opyd, Lavone Neri, MD, 100 mg at 08/13/23 0858   aspirin EC tablet 81 mg, 81 mg, Oral, Daily, Opyd, Lavone Neri, MD, 81 mg at 08/13/23 0859   atorvastatin (LIPITOR) tablet 80 mg, 80 mg, Oral, Daily, Opyd, Lavone Neri, MD, 80 mg at 08/13/23 0858   feeding supplement (ENSURE ENLIVE / ENSURE PLUS) liquid 237 mL, 237 mL, Oral, BID BM, Dorcas Carrow, MD, 237 mL at 08/13/23 0901   furosemide (LASIX) injection 40 mg, 40 mg, Intravenous, BID, Opyd, Lavone Neri, MD, 40 mg at 08/13/23 0859   guaiFENesin-dextromethorphan (ROBITUSSIN DM) 100-10 MG/5ML syrup 10 mL, 10 mL, Oral, Q4H PRN, Opyd, Lavone Neri, MD   lidocaine (LIDODERM) 5 % 1 patch, 1 patch, Transdermal, Q24H, Ghimire, Kuber, MD, 1 patch at 08/12/23 1214   metoprolol tartrate (LOPRESSOR) tablet 12.5 mg, 12.5 mg, Oral, BID, Opyd, Lavone Neri, MD, 12.5 mg at 08/13/23 0858   pantoprazole (PROTONIX) EC tablet 40 mg, 40 mg, Oral, Daily, Opyd, Lavone Neri, MD, 40 mg at 08/13/23 0858   senna-docusate (Senokot-S) tablet 1 tablet, 1 tablet, Oral, QHS PRN, Opyd, Lavone Neri, MD   sodium chloride flush (NS) 0.9 % injection 3 mL, 3 mL, Intravenous, Q12H, Opyd, Lavone Neri, MD, 3 mL at 08/13/23 0857   warfarin (COUMADIN) tablet 4 mg, 4 mg, Oral, ONCE-1600, Katsaros, Marcia Brash, RPH   Warfarin - Pharmacist Dosing Inpatient, , Does not apply, q1600, Arma Heading, RPH  amiodarone  100 mg Oral Daily   aspirin EC  81 mg Oral Daily   atorvastatin  80 mg Oral Daily   feeding supplement  237 mL Oral BID BM   furosemide  40 mg Intravenous BID   lidocaine  1 patch Transdermal Q24H   metoprolol tartrate  12.5 mg Oral BID   pantoprazole  40 mg Oral Daily   sodium chloride flush  3 mL Intravenous Q12H   warfarin  4 mg  Oral ONCE-1600   Warfarin  - Pharmacist Dosing Inpatient   Does not apply q1600     Physical Exam: Blood pressure (!) 119/100, pulse (!) 113, temperature 97.8 F (36.6 C), temperature source Oral, resp. rate 20, height 5\' 1"  (1.549 m), weight 53.4 kg, SpO2 94%.    Elderly male sitting in chair Lungs with basilar crackles SEM through TAVR valve no AR murmur PMI increased  Abdomen benign JVP elevated  Plus one edema  Labs:   Lab Results  Component Value Date   WBC 7.2 08/13/2023   HGB 12.4 (L) 08/13/2023   HCT 36.8 (L) 08/13/2023   MCV 97.4 08/13/2023   PLT 175 08/13/2023    Recent Labs  Lab 08/11/23 0934 08/12/23 0406 08/13/23 0408  NA 137   < > 138  K 4.5   < > 3.4*  CL 101   < > 95*  CO2 27   < > 27  BUN 26*   < > 35*  CREATININE 1.70*   < > 1.67*  CALCIUM 8.6*   < > 8.4*  PROT 6.8  --   --   BILITOT 1.3*  --   --   ALKPHOS 97  --   --   ALT 46*  --   --   AST 34  --   --   GLUCOSE 139*   < > 98   < > = values in this interval not displayed.   No results found for: "CKTOTAL", "CKMB", "CKMBINDEX", "TROPONINI"  Lab Results  Component Value Date   CHOL 135 06/05/2023   CHOL 131 09/01/2022   CHOL 152 06/27/2022   Lab Results  Component Value Date   HDL 50 06/05/2023   HDL 50.00 09/01/2022   HDL 49.00 06/27/2022   Lab Results  Component Value Date   LDLCALC 68 06/05/2023   LDLCALC 65 09/01/2022   LDLCALC 84 06/27/2022   Lab Results  Component Value Date   TRIG 90 06/05/2023   TRIG 83.0 09/01/2022   TRIG 94.0 06/27/2022   Lab Results  Component Value Date   CHOLHDL 2.7 06/05/2023   CHOLHDL 3 09/01/2022   CHOLHDL 3 06/27/2022   No results found for: "LDLDIRECT"    Radiology: ECHOCARDIOGRAM COMPLETE  Result Date: 08/12/2023    ECHOCARDIOGRAM REPORT   Patient Name:   Emillio CHOSEN BEILKE Date of Exam: 08/12/2023 Medical Rec #:  [id number]        Height:       61.0 in Accession #:    [id number]       Weight:       110.6 lb Date of Birth:  June 18, 1941         BSA:          1.468 m  Patient Age:    82 years         BP:           110/64 mmHg Patient Gender: M                HR:           81 bpm. Exam Location:  Inpatient Procedure: 2D Echo, Color Doppler and Cardiac Doppler Indications:    acute systolic chf  History:        Patient has prior history of Echocardiogram examinations, most                 recent 02/16/2022. Cardiomyopathy, Prior CABG, chronic kidney  disease, Arrythmias:Atrial Fibrillation; Risk                 Factors:Hypertension and Dyslipidemia.                 Aortic Valve: 26 mm Edwards Sapien prosthetic, stented (TAVR)                 valve is present in the aortic position. Procedure Date: 4/16.  Sonographer:    Delcie Roch RDCS Referring Phys: [id number] KUBER GHIMIRE IMPRESSIONS  1. Left ventricular ejection fraction, by estimation, is <20%. The left ventricle has severely decreased function. The left ventricle demonstrates global hypokinesis. The left ventricular internal cavity size was mildly dilated. Left ventricular diastolic parameters are indeterminate.  2. Right ventricular systolic function is moderately reduced. The right ventricular size is normal. There is mildly elevated pulmonary artery systolic pressure. The estimated right ventricular systolic pressure is 35.0 mmHg.  3. Left atrial size was severely dilated.  4. Right atrial size was mildly dilated.  5. The mitral valve is normal in structure. Mild mitral valve regurgitation.  6. The aortic valve has been repaired/replaced. Aortic valve regurgitation is mild to moderate. There is a 26 mm Edwards Sapien prosthetic (TAVR) valve present in the aortic position. Procedure Date: 4/16. Echo findings are consistent with perivalvular leak of the aortic prosthesis. Vmax 2.4, MG , EOA 1.2 cm^2, DI 0.40  7. The inferior vena cava is normal in size with greater than 50% respiratory variability, suggesting right atrial pressure of 3 mmHg. FINDINGS  Left Ventricle: Left ventricular ejection  fraction, by estimation, is <20%. The left ventricle has severely decreased function. The left ventricle demonstrates global hypokinesis. The left ventricular internal cavity size was mildly dilated. There is no  left ventricular hypertrophy. Left ventricular diastolic parameters are indeterminate. Right Ventricle: The right ventricular size is normal. No increase in right ventricular wall thickness. Right ventricular systolic function is moderately reduced. There is mildly elevated pulmonary artery systolic pressure. The tricuspid regurgitant velocity is 2.83 m/s, and with an assumed right atrial pressure of 3 mmHg, the estimated right ventricular systolic pressure is 35.0 mmHg. Left Atrium: Left atrial size was severely dilated. Right Atrium: Right atrial size was mildly dilated. Pericardium: There is no evidence of pericardial effusion. Mitral Valve: The mitral valve is normal in structure. Mild mitral valve regurgitation. Tricuspid Valve: The tricuspid valve is normal in structure. Tricuspid valve regurgitation is trivial. Aortic Valve: The aortic valve has been repaired/replaced. Aortic valve regurgitation is mild to moderate. Aortic valve mean gradient measures 8.2 mmHg. Aortic valve peak gradient measures 17.3 mmHg. Aortic valve area, by VTI measures 1.08 cm. There is a 26 mm Edwards Sapien prosthetic, stented (TAVR) valve present in the aortic position. Procedure Date: 4/16. Pulmonic Valve: The pulmonic valve was not well visualized. Pulmonic valve regurgitation is mild. Aorta: The aortic root and ascending aorta are structurally normal, with no evidence of dilitation. Venous: The inferior vena cava is normal in size with greater than 50% respiratory variability, suggesting right atrial pressure of 3 mmHg. IAS/Shunts: The interatrial septum was not well visualized.  LEFT VENTRICLE PLAX 2D LVIDd:         5.80 cm LVIDs:         5.50 cm LV PW:         0.90 cm LV IVS:        1.10 cm LVOT diam:     1.80 cm LV  SV:  39 LV SV Index:   27 LVOT Area:     2.54 cm  RIGHT VENTRICLE          IVC RV Basal diam:  2.90 cm  IVC diam: 1.70 cm LEFT ATRIUM             Index        RIGHT ATRIUM           Index LA diam:        4.50 cm 3.06 cm/m   RA Area:     16.80 cm LA Vol (A2C):   95.9 ml 65.31 ml/m  RA Volume:   44.50 ml  30.31 ml/m LA Vol (A4C):   66.3 ml 45.15 ml/m LA Biplane Vol: 82.8 ml 56.39 ml/m  AORTIC VALVE AV Area (Vmax):    1.10 cm AV Area (Vmean):   1.12 cm AV Area (VTI):     1.08 cm AV Vmax:           208.00 cm/s AV Vmean:          130.500 cm/s AV VTI:            0.364 m AV Peak Grad:      17.3 mmHg AV Mean Grad:      8.2 mmHg LVOT Vmax:         89.80 cm/s LVOT Vmean:        57.550 cm/s LVOT VTI:          0.155 m LVOT/AV VTI ratio: 0.43  AORTA Ao Asc diam: 3.20 cm TRICUSPID VALVE TR Peak grad:   32.0 mmHg TR Vmax:        283.00 cm/s  SHUNTS Systemic VTI:  0.16 m Systemic Diam: 1.80 cm Epifanio Lesches MD Electronically signed by Epifanio Lesches MD Signature Date/Time: 08/12/2023/7:07:09 PM    Final    DG Chest 2 View  Result Date: 08/11/2023 CLINICAL DATA:  Shortness of breath EXAM: CHEST - 2 VIEW COMPARISON:  07/12/2023 FINDINGS: Prior CABG. Heart is borderline in size. Mediastinal contours within normal limits. Aortic atherosclerosis. Small bilateral effusions with bibasilar opacities, likely atelectasis. IMPRESSION: Borderline heart size. Small bilateral effusions with bibasilar atelectasis. Electronically Signed   By: Charlett Nose M.D.   On: 08/11/2023 01:06   CT Chest Wo Contrast  Result Date: 08/01/2023 CLINICAL DATA:  Abnormal lung sounds on exam.  Atrial fibrillation. EXAM: CT CHEST WITHOUT CONTRAST TECHNIQUE: Multidetector CT imaging of the chest was performed following the standard protocol without IV contrast. RADIATION DOSE REDUCTION: This exam was performed according to the departmental dose-optimization program which includes automated exposure control, adjustment of the mA  and/or kV according to patient size and/or use of iterative reconstruction technique. COMPARISON:  07/12/2023 chest radiograph. 02/13/2015 chest CT angiogram. FINDINGS: Cardiovascular: Mild-to-moderate cardiomegaly. Aortic valve prosthesis in place. No significant pericardial effusion/thickening. Three-vessel coronary atherosclerosis status post CABG. Atherosclerotic nonaneurysmal thoracic aorta. Normal caliber pulmonary arteries. Mediastinum/Nodes: Heterogeneously calcified 1.3 cm hypodense posterior right thyroid nodule. Not clinically significant; no follow-up imaging recommended (ref: J Am Coll Radiol. 2015 Feb;12(2): 143-50). Unremarkable esophagus. No pathologically enlarged axillary, mediastinal or hilar lymph nodes, noting limited sensitivity for the detection of hilar adenopathy on this noncontrast study. Lungs/Pleura: No pneumothorax. Small posterior right and trace posterior left pleural effusions. Subsolid 1.3 x 1.0 cm posterior left lower lobe pulmonary nodule (series 4/image 100). Sub solid anterior left upper lobe 0.6 cm pulmonary nodule (series 4/image 90). Mild interlobular septal thickening throughout both lungs. Mild patchy ground-glass opacity  in the posterior lower lobes with some associated volume loss. No significant regions of traction bronchiectasis, architectural distortion or frank honeycombing. Mild platelike atelectasis in the dependent lower lobes. Otherwise no acute consolidative airspace disease or lung masses. Upper abdomen: Small hiatal hernia. Cholecystectomy. 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 Musculoskeletal: No aggressive appearing focal osseous lesions. Intact sternotomy wires. Mild upper thoracic spondylosis with exaggerated upper thoracic kyphosis. IMPRESSION: 1. Subsolid 1.3 cm posterior left lower lobe pulmonary nodule. Subsolid 0.6 cm anterior left  upper lobe pulmonary nodule. Per Fleischner Society Guidelines, recommend a non-contrast Chest CT at 3-6 months. Subsequent management based on the most suspicious nodule(s). These guidelines do not apply to immunocompromised patients and patients with cancer. Follow up in patients with significant comorbidities as clinically warranted. For lung cancer screening, adhere to Lung-RADS guidelines. Reference: Radiology. 2017; 284(1):228-43. 2. Mild-to-moderate cardiomegaly. Mild interlobular septal thickening throughout both lungs, compatible with mild pulmonary edema. Small posterior right and trace posterior left pleural effusions. Findings suggest mild congestive heart failure. 3. Mild patchy ground-glass opacity in the posterior lower lobes with some associated volume loss, favor hypoventilatory change. No compelling findings of interstitial lung disease. 4. Small hiatal hernia. 5.  Aortic Atherosclerosis (ICD10-I70.0). Electronically Signed   By: Delbert Phenix M.D.   On: 08/01/2023 22:27    EKG: afib rate 100 LBBB   ASSESSMENT AND PLAN:   CHF:  chronic ischemic DCM with worsening EF 20%. Given age not a candidate for BiV AICD. Would also not consider milrinonre He is DNR and discussed heart cath to assess cors and patency of stent. He does not want invasive procedure. His INR have been Rx since last 4 weeks although yesterday 1.9.  Can consider [facility] as afib may have caused worsening of his EF recently. He is not keen on [facility] either. His BP has been soft Tolerating lopressor 12.5 bid and lasix 40 mg iv bid with improvement in symptoms. Will try to initiate low dose ACE and follow K/Cr which are currently 3.4 and 1.55 Suspect unless he agrees to [facility] palliative consult would be appropriate PAF:  increase amiodarone to 200 bid. Continue coumadin and lopressor See above discussion regarding possible [facility]  TAVR:  2016 with 26 mm Sapien 3 valve known mild PVL stable  CAD/CABG:  with history of stent to PDA graft  02/17/22 no angina negative troponin ECG with LBBB   Signed: Charlton Haws 08/13/2023, 11:45 AM

## 2023-08-13 NOTE — Progress Notes (Addendum)
ANTICOAGULATION CONSULT NOTE   Pharmacy Consult for warfarin > heparin Indication: atrial fibrillation  Allergies  Allergen Reactions   Iodine Swelling and Other (See Comments)    Skin swells- CAN DRINK BOOST AND ENSURE WITH NO ISSUES   Amlodipine Swelling and Other (See Comments)    Swelling?   Codeine Other (See Comments)    Reaction not recalled   Vital Signs: Temp: 97.5 F (36.4 C) (09/08 0750) Temp Source: Oral (09/08 0750) Pulse Rate: 110 (09/08 0456)  Labs: Recent Labs    08/11/23 0016 08/11/23 0155 08/11/23 0934 08/12/23 0406 08/12/23 0843 08/13/23 0408  HGB 12.5*  --  12.9* 12.5*  --  12.4*  HCT 38.4*  --  39.6 37.6*  --  36.8*  PLT 187  --  186 167  --  175  LABPROT  --  26.0*  --   --  21.9* 24.5*  INR  --  2.4*  --   --  1.9* 2.2*  CREATININE 1.50*  --  1.70* 1.55*  --  1.67*  TROPONINIHS 20* 20*  --   --   --   --     Estimated Creatinine Clearance: 24.4 mL/min (A) (by C-G formula based on SCr of 1.67 mg/dL (H)).  Assessment: Jesus [name] is a 82 y.o. year old male admitted on 08/10/2023 with concern for HF exacerbation. On warfarin prior to admission, last dose PTA 9/5. Prior to admission warfarin regimen 2 mg every Mon, Wed, Fri; 4 mg all other days. Noted patient was also on amiodarone PTA, this has been continued in the hospital.   9/8: INR 2.2, therapeutic with home regimen resumed. CBC stable (Hgb 12.4, Plt 175).  Goal of Therapy:  INR 2-3 Monitor platelets by anticoagulation protocol: Yes   Plan:  Plan for warfarin 4mg  today  F/u daily INR and bleeding assessments  ADDENDUM 9/8 1000: Transitioning patient from warfarin to heparin due to the need right/left heart cath due to worsening CHF and old bypass grafts per MD.  -Monitor daily INR and initiate heparin when level is subtherapeutic  ADDENDUM 9/8 1030: Per MD, patient DNR and not interested in a RHC/LHC at this time. Will resume patient on warfarin as noted above.   Enos Fling,  PharmD PGY-1 Acute Care Pharmacy Resident 08/13/2023 8:08 AM

## 2023-08-13 NOTE — Evaluation (Signed)
Occupational Therapy Evaluation Patient Details Name: Jesus [name] MRN: [id number] DOB: January 26, 1941 Today's Date: 08/13/2023   History of Present Illness Pt is an 82 y/o M presenting to ED on 9/5 with worsening LE edema/dyspnea, admitted for acute on chronic combined systolic and diastolic CHF. PMH includes CAD s/p CABG and subsequent stents, PAF on coumadin, severe AS s/p TAVR, CKD II, chronic combined systolic and diastolic CHF   Clinical Impression   Pt reports ind at baseline with ADLs and functional mobility, lives alone but reports he has PRN assist from family. Pt with elevated HR to 140's during session, overall needing set up-min A for ADLs, and CGA for transfers with RW. Pt presenting with impairments listed below, will follow acutely. Recommend HHOT at d/c pending progression.       If plan is discharge home, recommend the following: A little help with walking and/or transfers;A little help with bathing/dressing/bathroom;Assistance with cooking/housework    Functional Status Assessment  Patient has had a recent decline in their functional status and demonstrates the ability to make significant improvements in function in a reasonable and predictable amount of time.  Equipment Recommendations  None recommended by OT (pt has all needed DME)    Recommendations for Other Services PT consult     Precautions / Restrictions Precautions Precautions: Fall Restrictions Weight Bearing Restrictions: No      Mobility Bed Mobility               General bed mobility comments: seated EOB upon arrival and in chair at departure    Transfers Overall transfer level: Needs assistance Equipment used: Rolling walker (2 wheels) Transfers: Sit to/from Stand Sit to Stand: Contact guard assist                  Balance Overall balance assessment: Needs assistance Sitting-balance support: Feet supported Sitting balance-Leahy Scale: Good     Standing balance support:  During functional activity Standing balance-Leahy Scale: Fair Standing balance comment: RW support in standing                           ADL either performed or assessed with clinical judgement   ADL Overall ADL's : Needs assistance/impaired Eating/Feeding: Set up;Sitting   Grooming: Set up;Standing   Upper Body Bathing: Minimal assistance   Lower Body Bathing: Minimal assistance   Upper Body Dressing : Minimal assistance   Lower Body Dressing: Minimal assistance   Toilet Transfer: Minimal assistance   Toileting- Clothing Manipulation and Hygiene: Contact guard assist       Functional mobility during ADLs: Contact guard assist;Rolling walker (2 wheels)       Vision   Vision Assessment?: No apparent visual deficits     Perception Perception: Not tested       Praxis Praxis: Not tested       Pertinent Vitals/Pain Pain Assessment Pain Assessment: No/denies pain     Extremity/Trunk Assessment Upper Extremity Assessment Upper Extremity Assessment: Generalized weakness   Lower Extremity Assessment Lower Extremity Assessment: Defer to PT evaluation   Cervical / Trunk Assessment Cervical / Trunk Assessment: Kyphotic   Communication Communication Communication: Hearing impairment   Cognition Arousal: Alert Behavior During Therapy: WFL for tasks assessed/performed Overall Cognitive Status: No family/caregiver present to determine baseline cognitive functioning  General Comments: HOH vs cognition, oriented x3, incr cues for situation, follows commands appropriately. Had initially reported he ate breakfast, but then breakfast tray arriving and pt reports he has not eaten yet     General Comments  HR up to 140bpm with ambulation to bathroom and standing grooming task    Exercises     Shoulder Instructions      Home Living Family/patient expects to be discharged to:: Private residence Living  Arrangements: Alone Available Help at Discharge: Family;Available PRN/intermittently (family, sister in law) Type of Home: House Home Access: Stairs to enter [employer] of Steps: 1 (if entering from garage) Entrance Stairs-Rails: Left Home Layout: Two level;Other (Comment);Able to live on main level with bedroom/bathroom (full basement)     Bathroom Shower/Tub: Producer, television/film/video: Standard Bathroom Accessibility: Yes   Home Equipment: Cane - single point;Shower seat - built in;Grab bars - Chartered loss adjuster (2 wheels)          Prior Functioning/Environment Prior Level of Function : Independent/Modified Independent;Driving             Mobility Comments: uses RW ADLs Comments: ind, does yard work        OT Problem List: Decreased strength;Decreased range of motion;Decreased activity tolerance;Impaired balance (sitting and/or standing);Decreased cognition;Decreased safety awareness;Cardiopulmonary status limiting activity      OT Treatment/Interventions: Self-care/ADL training;Therapeutic exercise;Energy conservation;DME and/or AE instruction;Therapeutic activities;Patient/family education;Balance training    OT Goals(Current goals can be found in the care plan section) Acute Rehab OT Goals Patient Stated Goal: none stated OT Goal Formulation: With patient Time For Goal Achievement: 08/27/23 Potential to Achieve Goals: Good ADL Goals Pt Will Perform Upper Body Dressing: with supervision;sitting Pt Will Perform Lower Body Dressing: with supervision;sit to/from stand;sitting/lateral leans Pt Will Transfer to Toilet: with supervision;ambulating;regular height toilet Pt Will Perform Tub/Shower Transfer: Tub transfer;Shower transfer;with supervision;ambulating  OT Frequency: Min 1X/week    Co-evaluation              AM-PAC OT "6 Clicks" Daily Activity     Outcome Measure Help from another person eating meals?: None Help from another  person taking care of personal grooming?: A Little Help from another person toileting, which includes using toliet, bedpan, or urinal?: A Little Help from another person bathing (including washing, rinsing, drying)?: A Little Help from another person to put on and taking off regular upper body clothing?: A Little Help from another person to put on and taking off regular lower body clothing?: A Little 6 Click Score: 19   End of Session Equipment Utilized During Treatment: Gait belt;Rolling walker (2 wheels) Nurse Communication: Mobility status (no chair alarm cord to wall)  Activity Tolerance: Patient tolerated treatment well Patient left: in chair;with call bell/phone within reach;with chair alarm set  OT Visit Diagnosis: Other abnormalities of gait and mobility (R26.89);Unsteadiness on feet (R26.81);Muscle weakness (generalized) (M62.81)                Time: 1914-7829 OT Time Calculation (min): 22 min Charges:  OT General Charges $OT Visit: 1 Visit OT Evaluation $OT Eval Low Complexity: 1 Low  Johanan Skorupski K, OTD, OTR/L SecureChat Preferred Acute Rehab [phone]   Zaven Klemens K Koonce 08/13/2023, 9:21 AM

## 2023-08-13 NOTE — Progress Notes (Signed)
PROGRESS NOTE    Jesus [name]  MWN:[id number] DOB: 28-Mar-1941 DOA: 08/10/2023 PCP: Kristian Covey, MD    Brief Narrative:  82 year old man with history of coronary artery disease and remote CABG, PAF on Coumadin, severe aortic stenosis status post TAVR, CKD stage II, chronic combined heart failure with known ejection fraction 35% presented with about 3 days of exertional dyspnea and leg swelling despite increasing dose of Lasix at home. In the emergency room on room air. Blood pressures stable. Chest x-ray with small bilateral pleural effusion. Creatinine 1.5. Potassium 5.2. BNP 3549. Started on Lasix and admitted to the hospital.  Patient did some good clinical recovery with IV Lasix.  His repeat ejection fraction was less than 20%. 9/8, improving.  Cardiology consulted.  Patient declined any invasive procedures including cardiac cath.   Assessment & Plan:   Acute on chronic combined heart failure: Previous EF of 35 to 40% with grade 2 diastolic dysfunction from 02/2023.  Ejection fraction on repeat echocardiogram less than 20%. Continue diuresis 40 mg IV Lasix every 12 hours, intake and output monitoring, electrolyte monitoring renal function test monitoring and urine output monitoring.  Clinically improving.   Seen by cardiology.  Started on low-dose lisinopril 2.5 mg daily.  Already on metoprolol 12.5 twice daily. Recommended ischemic evaluation with right and left heart cath, however patient declined any invasive procedures.  Anticipate conservative management. Due to very advanced congestive heart failure, not being candidate for ICD or even for ischemic evaluation may benefit with ongoing palliative care discussion or even palliation or hospice at home.  Will consult palliative care team.  AKI on CKD stage IIIa: Patient does have baseline creatinine about 1.2-1.3.  Presented with creatinine 1.5-1.7-1.67. Will continue diuresis and monitor levels.  Recheck tomorrow  morning.  Hypokalemia: Replace and monitor levels.  Additional potassium today.  Paroxysmal A-fib: INR is therapeutic.  Sinus rhythm on amiodarone and metoprolol.  Tolerating.  Re dose Coumadin.  Coronary artery disease: Stable.  On aspirin, Lipitor, metoprolol.   DVT prophylaxis:  warfarin (COUMADIN) tablet 4 mg   Code Status: DNR with limited intervention Family Communication: Patient's niece and family were updated by Cardiologist.  Disposition Plan: Status is: Inpatient Remains inpatient appropriate because: Fluid overload, IV diuresis     Consultants:  cardiologist  Procedures:  None  Antimicrobials:  None   Subjective:  Patient seen in the morning rounds.  Patient tells me he is feeling better and was able to sleep well.  He is pleasant.  I discussed with him about his ejection fraction less than 20%.  He tells me he understands it and also tells me that he did feel his heart is very weak.  Denies any chest pain or palpitations.  He declined to have any procedures which is appropriate given no much benefit.  He would still like to go home after discharge and live independently.  Objective: Vitals:   08/13/23 0601 08/13/23 0750 08/13/23 0857 08/13/23 1150  BP:   (!) 119/100   Pulse:   (!) 113   Resp:   20   Temp:  (!) 97.5 F (36.4 C) 97.8 F (36.6 C) 98 F (36.7 C)  TempSrc:  Oral Oral Oral  SpO2:      Weight: 53.4 kg     Height:        Intake/Output Summary (Last 24 hours) at 08/13/2023 1448 Last data filed at 08/13/2023 0800 Gross per 24 hour  Intake 480 ml  Output 1700 ml  Net -  1220 ml   Filed Weights   08/11/23 0456 08/12/23 0853 08/13/23 0601  Weight: 52.2 kg 50.2 kg 53.4 kg    Examination:  General exam: Appears calm and comfortable, sitting in chair.  On room air. Respiratory system: No added sounds. Cardiovascular system: S1 & S2 heard, RRR.  Trace bilateral pedal edema. Gastrointestinal system: Abdomen is nondistended, soft and nontender.  No organomegaly or masses felt. Normal bowel sounds heard. Central nervous system: Alert and oriented. No focal neurological deficits. Extremities: Symmetric 5 x 5 power.    Data Reviewed: I have personally reviewed following labs and imaging studies  CBC: Recent Labs  Lab 08/11/23 0016 08/11/23 0934 08/12/23 0406 08/13/23 0408  WBC 9.7 8.1 8.1 7.2  HGB 12.5* 12.9* 12.5* 12.4*  HCT 38.4* 39.6 37.6* 36.8*  MCV 103.2* 99.2 100.0 97.4  PLT 187 186 167 175   Basic Metabolic Panel: Recent Labs  Lab 08/11/23 0016 08/11/23 0934 08/12/23 0406 08/13/23 0408  NA 135 137 135 138  K 5.2* 4.5 3.4* 3.4*  CL 100 101 97* 95*  CO2 22 27 27 27   GLUCOSE 128* 139* 97 98  BUN 26* 26* 28* 35*  CREATININE 1.50* 1.70* 1.55* 1.67*  CALCIUM 8.7* 8.6* 8.3* 8.4*  MG  --  2.2  --   --    GFR: Estimated Creatinine Clearance: 24.4 mL/min (A) (by C-G formula based on SCr of 1.67 mg/dL (H)). Liver Function Tests: Recent Labs  Lab 08/11/23 0016 08/11/23 0934  AST 36 34  ALT 47* 46*  ALKPHOS 97 97  BILITOT 1.3* 1.3*  PROT 6.7 6.8  ALBUMIN 3.3* 3.3*   No results for input(s): "LIPASE", "AMYLASE" in the last 168 hours. No results for input(s): "AMMONIA" in the last 168 hours. Coagulation Profile: Recent Labs  Lab 08/11/23 0155 08/12/23 0843 08/13/23 0408  INR 2.4* 1.9* 2.2*   Cardiac Enzymes: No results for input(s): "CKTOTAL", "CKMB", "CKMBINDEX", "TROPONINI" in the last 168 hours. BNP (last 3 results) No results for input(s): "PROBNP" in the last 8760 hours. HbA1C: No results for input(s): "HGBA1C" in the last 72 hours. CBG: No results for input(s): "GLUCAP" in the last 168 hours. Lipid Profile: No results for input(s): "CHOL", "HDL", "LDLCALC", "TRIG", "CHOLHDL", "LDLDIRECT" in the last 72 hours. Thyroid Function Tests: No results for input(s): "TSH", "T4TOTAL", "FREET4", "T3FREE", "THYROIDAB" in the last 72 hours. Anemia Panel: No results for input(s): "VITAMINB12",  "FOLATE", "FERRITIN", "TIBC", "IRON", "RETICCTPCT" in the last 72 hours. Sepsis Labs: No results for input(s): "PROCALCITON", "LATICACIDVEN" in the last 168 hours.  No results found for this or any previous visit (from the past 240 hour(s)).       Radiology Studies: ECHOCARDIOGRAM COMPLETE  Result Date: 08/12/2023    ECHOCARDIOGRAM REPORT   Patient Name:   Iven TREYSEN OTTUM Date of Exam: 08/12/2023 Medical Rec #:  [id number]        Height:       61.0 in Accession #:    [id number]       Weight:       110.6 lb Date of Birth:  10-22-1941         BSA:          1.468 m Patient Age:    82 years         BP:           110/64 mmHg Patient Gender: M                HR:  81 bpm. Exam Location:  Inpatient Procedure: 2D Echo, Color Doppler and Cardiac Doppler Indications:    acute systolic chf  History:        Patient has prior history of Echocardiogram examinations, most                 recent 02/16/2022. Cardiomyopathy, Prior CABG, chronic kidney                 disease, Arrythmias:Atrial Fibrillation; Risk                 Factors:Hypertension and Dyslipidemia.                 Aortic Valve: 26 mm Edwards Sapien prosthetic, stented (TAVR)                 valve is present in the aortic position. Procedure Date: 4/16.  Sonographer:    Delcie Roch RDCS Referring Phys: [id number] Toluwanimi Radebaugh IMPRESSIONS  1. Left ventricular ejection fraction, by estimation, is <20%. The left ventricle has severely decreased function. The left ventricle demonstrates global hypokinesis. The left ventricular internal cavity size was mildly dilated. Left ventricular diastolic parameters are indeterminate.  2. Right ventricular systolic function is moderately reduced. The right ventricular size is normal. There is mildly elevated pulmonary artery systolic pressure. The estimated right ventricular systolic pressure is 35.0 mmHg.  3. Left atrial size was severely dilated.  4. Right atrial size was mildly dilated.  5. The mitral valve is  normal in structure. Mild mitral valve regurgitation.  6. The aortic valve has been repaired/replaced. Aortic valve regurgitation is mild to moderate. There is a 26 mm Edwards Sapien prosthetic (TAVR) valve present in the aortic position. Procedure Date: 4/16. Echo findings are consistent with perivalvular leak of the aortic prosthesis. Vmax 2.4, MG , EOA 1.2 cm^2, DI 0.40  7. The inferior vena cava is normal in size with greater than 50% respiratory variability, suggesting right atrial pressure of 3 mmHg. FINDINGS  Left Ventricle: Left ventricular ejection fraction, by estimation, is <20%. The left ventricle has severely decreased function. The left ventricle demonstrates global hypokinesis. The left ventricular internal cavity size was mildly dilated. There is no  left ventricular hypertrophy. Left ventricular diastolic parameters are indeterminate. Right Ventricle: The right ventricular size is normal. No increase in right ventricular wall thickness. Right ventricular systolic function is moderately reduced. There is mildly elevated pulmonary artery systolic pressure. The tricuspid regurgitant velocity is 2.83 m/s, and with an assumed right atrial pressure of 3 mmHg, the estimated right ventricular systolic pressure is 35.0 mmHg. Left Atrium: Left atrial size was severely dilated. Right Atrium: Right atrial size was mildly dilated. Pericardium: There is no evidence of pericardial effusion. Mitral Valve: The mitral valve is normal in structure. Mild mitral valve regurgitation. Tricuspid Valve: The tricuspid valve is normal in structure. Tricuspid valve regurgitation is trivial. Aortic Valve: The aortic valve has been repaired/replaced. Aortic valve regurgitation is mild to moderate. Aortic valve mean gradient measures 8.2 mmHg. Aortic valve peak gradient measures 17.3 mmHg. Aortic valve area, by VTI measures 1.08 cm. There is a 26 mm Edwards Sapien prosthetic, stented (TAVR) valve present in the aortic  position. Procedure Date: 4/16. Pulmonic Valve: The pulmonic valve was not well visualized. Pulmonic valve regurgitation is mild. Aorta: The aortic root and ascending aorta are structurally normal, with no evidence of dilitation. Venous: The inferior vena cava is normal in size with greater than 50% respiratory variability, suggesting right atrial pressure  of 3 mmHg. IAS/Shunts: The interatrial septum was not well visualized.  LEFT VENTRICLE PLAX 2D LVIDd:         5.80 cm LVIDs:         5.50 cm LV PW:         0.90 cm LV IVS:        1.10 cm LVOT diam:     1.80 cm LV SV:         39 LV SV Index:   27 LVOT Area:     2.54 cm  RIGHT VENTRICLE          IVC RV Basal diam:  2.90 cm  IVC diam: 1.70 cm LEFT ATRIUM             Index        RIGHT ATRIUM           Index LA diam:        4.50 cm 3.06 cm/m   RA Area:     16.80 cm LA Vol (A2C):   95.9 ml 65.31 ml/m  RA Volume:   44.50 ml  30.31 ml/m LA Vol (A4C):   66.3 ml 45.15 ml/m LA Biplane Vol: 82.8 ml 56.39 ml/m  AORTIC VALVE AV Area (Vmax):    1.10 cm AV Area (Vmean):   1.12 cm AV Area (VTI):     1.08 cm AV Vmax:           208.00 cm/s AV Vmean:          130.500 cm/s AV VTI:            0.364 m AV Peak Grad:      17.3 mmHg AV Mean Grad:      8.2 mmHg LVOT Vmax:         89.80 cm/s LVOT Vmean:        57.550 cm/s LVOT VTI:          0.155 m LVOT/AV VTI ratio: 0.43  AORTA Ao Asc diam: 3.20 cm TRICUSPID VALVE TR Peak grad:   32.0 mmHg TR Vmax:        283.00 cm/s  SHUNTS Systemic VTI:  0.16 m Systemic Diam: 1.80 cm Epifanio Lesches MD Electronically signed by Epifanio Lesches MD Signature Date/Time: 08/12/2023/7:07:09 PM    Final         Scheduled Meds:  Melene Muller ON 08/14/2023] amiodarone  200 mg Oral Daily   aspirin EC  81 mg Oral Daily   atorvastatin  80 mg Oral Daily   feeding supplement  237 mL Oral BID BM   furosemide  40 mg Intravenous BID   lidocaine  1 patch Transdermal Q24H   lisinopril  2.5 mg Oral Daily   metoprolol tartrate  12.5 mg Oral BID    pantoprazole  40 mg Oral Daily   potassium chloride  20 mEq Oral BID   sodium chloride flush  3 mL Intravenous Q12H   warfarin  4 mg Oral ONCE-1600   Warfarin - Pharmacist Dosing Inpatient   Does not apply q1600   Continuous Infusions:   LOS: 2 days    Time spent: 35 minutes    Dorcas Carrow, MD [facility]

## 2023-08-14 ENCOUNTER — Other Ambulatory Visit (HOSPITAL_COMMUNITY): Payer: Self-pay

## 2023-08-14 ENCOUNTER — Telehealth: Payer: Self-pay

## 2023-08-14 DIAGNOSIS — I5023 Acute on chronic systolic (congestive) heart failure: Secondary | ICD-10-CM

## 2023-08-14 DIAGNOSIS — D508 Other iron deficiency anemias: Secondary | ICD-10-CM

## 2023-08-14 DIAGNOSIS — E0590 Thyrotoxicosis, unspecified without thyrotoxic crisis or storm: Secondary | ICD-10-CM

## 2023-08-14 DIAGNOSIS — Z515 Encounter for palliative care: Secondary | ICD-10-CM | POA: Diagnosis not present

## 2023-08-14 DIAGNOSIS — L8990 Pressure ulcer of unspecified site, unspecified stage: Secondary | ICD-10-CM | POA: Insufficient documentation

## 2023-08-14 DIAGNOSIS — K219 Gastro-esophageal reflux disease without esophagitis: Secondary | ICD-10-CM

## 2023-08-14 DIAGNOSIS — I480 Paroxysmal atrial fibrillation: Secondary | ICD-10-CM | POA: Diagnosis not present

## 2023-08-14 DIAGNOSIS — E782 Mixed hyperlipidemia: Secondary | ICD-10-CM

## 2023-08-14 DIAGNOSIS — I10 Essential (primary) hypertension: Secondary | ICD-10-CM

## 2023-08-14 DIAGNOSIS — N179 Acute kidney failure, unspecified: Secondary | ICD-10-CM | POA: Diagnosis not present

## 2023-08-14 DIAGNOSIS — Z7189 Other specified counseling: Secondary | ICD-10-CM | POA: Diagnosis not present

## 2023-08-14 LAB — PROTIME-INR
INR: 2.4 — ABNORMAL HIGH (ref 0.8–1.2)
Prothrombin Time: 26.7 s — ABNORMAL HIGH (ref 11.4–15.2)

## 2023-08-14 LAB — MAGNESIUM: Magnesium: 2.2 mg/dL (ref 1.7–2.4)

## 2023-08-14 LAB — BASIC METABOLIC PANEL
Anion gap: 14 (ref 5–15)
BUN: 42 mg/dL — ABNORMAL HIGH (ref 8–23)
CO2: 28 mmol/L (ref 22–32)
Calcium: 8.6 mg/dL — ABNORMAL LOW (ref 8.9–10.3)
Chloride: 96 mmol/L — ABNORMAL LOW (ref 98–111)
Creatinine, Ser: 1.76 mg/dL — ABNORMAL HIGH (ref 0.61–1.24)
GFR, Estimated: 38 mL/min — ABNORMAL LOW (ref >60)
Glucose, Bld: 111 mg/dL — ABNORMAL HIGH (ref 70–99)
Potassium: 4.2 mmol/L (ref 3.5–5.1)
Sodium: 138 mmol/L (ref 135–145)

## 2023-08-14 LAB — CBC
HCT: 39.1 % (ref 39.0–52.0)
Hemoglobin: 12.8 g/dL — ABNORMAL LOW (ref 13.0–17.0)
MCH: 32.2 pg (ref 26.0–34.0)
MCHC: 32.7 g/dL (ref 30.0–36.0)
MCV: 98.2 fL (ref 80.0–100.0)
Platelets: 184 10*3/uL (ref 150–400)
RBC: 3.98 MIL/uL — ABNORMAL LOW (ref 4.22–5.81)
RDW: 14.6 % (ref 11.5–15.5)
WBC: 8.3 10*3/uL (ref 4.0–10.5)
nRBC: 0 % (ref 0.0–0.2)

## 2023-08-14 MED ORDER — WARFARIN SODIUM 2 MG PO TABS: 2.0000 mg | ORAL_TABLET | Freq: Once | ORAL | Status: DC

## 2023-08-14 MED ORDER — FUROSEMIDE 20 MG PO TABS
20.0000 mg | ORAL_TABLET | Freq: Every day | ORAL | Status: DC
  Administered 2023-08-14: 20 mg via ORAL
  Filled 2023-08-14: qty 1

## 2023-08-14 MED ORDER — METOPROLOL SUCCINATE ER 25 MG PO TB24
25.0000 mg | ORAL_TABLET | Freq: Every day | ORAL | 0 refills | Status: DC
  Filled 2023-08-14: qty 30, 30d supply, fill #0

## 2023-08-14 MED ORDER — WARFARIN SODIUM 2 MG PO TABS: 4.0000 mg | ORAL_TABLET | ORAL | Status: DC

## 2023-08-14 MED ORDER — FUROSEMIDE 20 MG PO TABS
20.0000 mg | ORAL_TABLET | Freq: Every day | ORAL | 0 refills | Status: DC
  Filled 2023-08-14: qty 30, 30d supply, fill #0

## 2023-08-14 MED ORDER — POTASSIUM CHLORIDE CRYS ER 20 MEQ PO TBCR
20.0000 meq | EXTENDED_RELEASE_TABLET | Freq: Every day | ORAL | Status: DC
  Administered 2023-08-14: 20 meq via ORAL

## 2023-08-14 MED ORDER — LISINOPRIL 2.5 MG PO TABS
2.5000 mg | ORAL_TABLET | Freq: Every day | ORAL | 0 refills | Status: DC
  Filled 2023-08-14: qty 30, 30d supply, fill #0

## 2023-08-14 MED ORDER — WARFARIN SODIUM 2 MG PO TABS: 2.0000 mg | ORAL_TABLET | ORAL | Status: DC

## 2023-08-14 MED ORDER — METOPROLOL SUCCINATE ER 25 MG PO TB24: 25.0000 mg | ORAL_TABLET | Freq: Every day | ORAL | Status: DC

## 2023-08-14 MED ORDER — AMIODARONE HCL 200 MG PO TABS
200.0000 mg | ORAL_TABLET | Freq: Every day | ORAL | 0 refills | Status: DC
  Filled 2023-08-14: qty 30, 30d supply, fill #0

## 2023-08-14 NOTE — Progress Notes (Signed)
   Heart Failure Stewardship Pharmacist Progress Note   PCP: Kristian Covey, MD PCP-Cardiologist: [name], MD    HPI:  82 yo M with PMH of CHF, CAD s/p CABG, afib, severe AS s/p TAVR, CKD II, and HTN.   Presented to the ED on 9/6 with shortness of breath. He takes lasix PRN at home but reported taking doses 3 times within the last week. He had some improvement with LE edema and his weight, but reports that his legs have gotten worse and he has severe shortness of breath with exertion. CXR with small bilateral effusions with bibasilar atelectasis. BNP elevated. Last ECHO 02/2022 showed LVEF 35-40%, RV normal, trivial MR.   Discharge HF Medications: Diuretic: furosemide 20 mg daily Beta Blocker: metoprolol XL 25 mg daily ACE/ARB/ARNI: lisinopril 2.5 mg daily SGLT2i: Jardiance 10 mg daily  Prior to admission HF Medications: Diuretic: furosemide 20 mg daily PRN Beta blocker: metoprolol tartrate 12.5 mg BID SGLT2i: Jardiance 10 mg daily  Pertinent Lab Values: Serum creatinine 1.76, BUN 42, Potassium 4.2, Sodium 138, Magnesium 2.2, BNP 3549, A1c 6  Vital Signs: Weight: 117 lbs (admission weight: 115 lbs) Blood pressure: 90/70s  Heart rate: 80-100s  I/O: net -1L since admission  Medication Assistance / Insurance Benefits Check: Does the patient have prescription insurance?  Yes Type of insurance plan: [facility] Medicare  Outpatient Pharmacy:  Prior to admission outpatient pharmacy: [facility] Is the patient willing to use [facility] TOC pharmacy at discharge? Yes Is the patient willing to transition their outpatient pharmacy to utilize a [facility] outpatient pharmacy?   No    Assessment: 1. Acute on chronic systolic CHF (LVEF 35-40%), due to ICM. NYHA class II symptoms. - Continue furosemide 20 mg daily at discharge. Strict I/Os and daily weights. Keep K>4 and Mg>2. - Agree with transitioning to metoprolol XL 25 mg daily - Continue lisinopril 2.5 mg daily - BP soft and  hyperkalemia this admission, hold off adding spironolactone for now - Agree with restarting Jardiance 10 mg daily at discharge   Plan: 1) Medication changes recommended at this time: - Agree with changes; discharge today  2) Patient assistance: - In the donut hole - copays during this period are higher than usual - Entresto copay in the donut hole is $173 - Farxiga copay in the donut hole is $146  3)  Education  - Patient has been educated on current HF medications and potential additions to HF medication regimen - Patient verbalizes understanding that over the next few months, these medication doses may change and more medications may be added to optimize HF regimen - Patient has been educated on basic disease state pathophysiology and goals of therapy   Sharen Hones, PharmD, BCPS Heart Failure Engineer, building services Phone [phone]

## 2023-08-14 NOTE — Telephone Encounter (Signed)
Noted.  Bruce W [name] MD [facility]  

## 2023-08-14 NOTE — Discharge Summary (Addendum)
Physician Discharge Summary   Patient: Jesus [name] MRN: [id number] DOB: Apr 07, 1941  Admit date:     08/10/2023  Discharge date: 08/14/23  Discharge Physician: York Ram Tonja Jezewski   PCP: Kristian Covey, MD   Recommendations at discharge:    Limited guideline medical therapy due to hypotension, patient placed on low dose lisinopril 2,5 mg. Diuresis with furosemide 20 mg daily and SLGT 2  inh.   Amiodarone increased to 200 mg daily, and added metoprolol succinate for rate control atrial fibrillation.  Continue warfarin for anticoagulation and INR follow up per outpatient clinic.  Discontinue clopidogrel, to reduce risk of bleeding.   Follow up renal function and electrolytes in 7 days. Follow up with Dr Docia Furl in 7 to 10 days. Follow up with Cardiology Dr Copper as scheduled.   Outpatient follow up with palliative care.   Discharge Diagnoses: Principal Problem:   Acute on chronic systolic CHF (congestive heart failure) (HCC) Active Problems:   Acute renal failure 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 (HCC)   CAD, AUTOLOGOUS BYPASS GRAFT   Paroxysmal atrial fibrillation (HCC)   Essential hypertension   Mixed hyperlipidemia   Iron deficiency anemia   GERD without esophagitis   HYPERTHYROIDISM  Resolved Problems:   * No resolved hospital problems. [facility] Course: [name] was admitted to the hospital with the working diagnosis of heart failure exacerbation.   82 yo male with the past medical history of coronary artery disease, paroxysmal atrial fibrillation, CKD, and AS sp TAVR who presented with dyspnea and lower extremity edema. Reported having lower extremity edema one week prior to his admission, he was prescribed furosemide for 3 days with improvement in his symptoms. Over the next 4 days he had recurrent lower extremity edema and now associated with worsening dyspnea prompting him to come to the hospital. On his initial physical examination his  blood pressure was 115/77, HR 107, RR 19 and 02 saturation 95%, lungs with no wheezing or rales, heart with S1 and S2 present and regular, abdomen with no distention, positive lower extremity edema.   Na 135, K 5,2 CL 100, bicarbonate 100, glucose 128 bun 26 cr 1,50  BNP 3,549  High sensitive troponin 20 and 20  Wbc 9,7 hgb 12.5 plt 187  Urine analysis SG 1,007, negative protein, glucose >500, negative leukocytes and negative hgb.   Chest radiograph with cardiomegaly, bilateral hilar vascular congestion, fluid in the right fissure, bilateral pleural effusions.   EKG 107 bpm, right axis deviation, qtc 542, left bundle brunch block, atrial fibrillation rhythm, no significant ST segment changes, negative T wave lead II, III, and AvF, (chronic), positive LVH.     Patient was placed on IV furosemide for diuresis. Echocardiogram with reduced LV systolic function.  Conservative medical therapy per patient's request.   09/09 patient with improvement in volume status, plan to follow up as outpatient.  Continue oral diuretic therapy at home.   Assessment and Plan: * Acute on chronic systolic CHF (congestive heart failure) (HCC) Echocardiogram with reduced LV systolic function with EF <20%, global hypokinesis, mild dilated LV cavity, RV systolic function with moderate reduction, RVSP 35 mmHg, LA with severe dilatation, RA with mild dilatation, aortic valve sp TAVR with mild to moderate regurgitation.   Patient was placed on IV furosemide with improvement in volume status.   Plan to continue medical therapy with lisinopril and metoprolol. Diuresis with furosemide 20 mg daily and SGLT 2 inh.  Limited pharmacologic therapy due to acute  reduction in GFR .   Acute renal failure 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 (HCC) Hypokalemia.   Patient had improvement in volume status, renal function at the time of his discharge with serum cr at 1,76 with K at 4,2 and serum bicarbonate 28. Na 138    K has been corrected.  Plan to continue diuresis with furosemide.  Follow up renal function and electrolytes as outpatient.   CAD, AUTOLOGOUS BYPASS GRAFT No active chest pain. No acute coronary syndrome.   Continue with aspirin and statin.   Paroxysmal atrial fibrillation (HCC) Continue rate control with amiodarone and metoprolol. Anticoagulation with warfarin.  Discharge INR is 2.4   Essential hypertension Continue blood pressure control with metoprolol and lisinopril.   Mixed hyperlipidemia Continue with statin therapy.   Iron deficiency anemia Hgb has been stable., Continue with iron supplementation,   GERD without esophagitis Continue pantoprazole   HYPERTHYROIDISM Continue levothyroxine        Consultants: cardiology  Procedures performed: none   Disposition: Home Diet recommendation:  Cardiac diet DISCHARGE MEDICATION: Allergies as of 08/14/2023       Reactions   Iodine Swelling, Other (See Comments)   Skin swells- CAN DRINK BOOST AND ENSURE WITH NO ISSUES   Amlodipine Swelling, Other (See Comments)   Swelling?   Codeine Other (See Comments)   Reaction not recalled        Medication List     STOP taking these medications    clopidogrel 75 MG tablet Commonly known as: PLAVIX   metoprolol tartrate 25 MG tablet Commonly known as: LOPRESSOR       TAKE these medications    acetaminophen 325 MG tablet Commonly known as: TYLENOL Take 1-2 tablets (325-650 mg total) by mouth every 4 (four) hours as needed for mild pain.   amiodarone 200 MG tablet Commonly known as: PACERONE Take 1 tablet (200 mg total) by mouth daily. Start taking on: August 15, 2023 What changed: how much to take   amoxicillin 500 MG capsule Commonly known as: AMOXIL Take 2,000 mg by mouth See admin instructions. Take 2,000 mg by mouth one hour prior to dental appointments   aspirin EC 81 MG tablet Take 1 tablet (81 mg total) by mouth daily. Swallow whole.    atorvastatin 80 MG tablet Commonly known as: LIPITOR Take 1 tablet (80 mg total) by mouth daily.   ezetimibe 10 MG tablet Commonly known as: Zetia Take 1 tablet (10 mg total) by mouth daily. What changed: when to take this   furosemide 20 MG tablet Commonly known as: LASIX Take 1 tablet (20 mg total) by mouth daily. Start taking on: August 15, 2023 What changed:  how much to take how to take this when to take this additional instructions   guaiFENesin-dextromethorphan 100-10 MG/5ML syrup Commonly known as: ROBITUSSIN DM Take 10 mLs by mouth every 4 (four) hours as needed for cough.   Jardiance 10 MG Tabs tablet Generic drug: empagliflozin TAKE 1 TABLET BY MOUTH EVERY DAY   lisinopril 2.5 MG tablet Commonly known as: ZESTRIL Take 1 tablet (2.5 mg total) by mouth daily. Start taking on: August 15, 2023   metoprolol succinate 25 MG 24 hr tablet Commonly known as: TOPROL-XL Take 1 tablet (25 mg total) by mouth at bedtime.   multivitamin,tx-minerals tablet Take 1 tablet by mouth daily with breakfast.   nitroGLYCERIN 0.4 MG SL tablet Commonly known as: NITROSTAT Place 1 tablet (0.4 mg total) under the tongue every 5 (five) minutes  x 3 doses as needed for chest pain.   pantoprazole 40 MG tablet Commonly known as: Protonix Take 1 tablet (40 mg total) by mouth daily. What changed: when to take this   vitamin C 1000 MG tablet Take 1,000 mg by mouth daily.   warfarin 4 MG tablet Commonly known as: COUMADIN Take as directed. If you are unsure how to take this medication, talk to your nurse or doctor. Original instructions: TAKE 1 TABLET BY MOUTH DAILY EXCEPT TAKE 1/2 TABLET ON MONDAYS AND FRIDAYS OR AS DIRECTED BY ANTICOAGULATION CLINIC What changed: See the new instructions.        Discharge Exam: Filed Weights   08/12/23 0853 08/13/23 0601 08/14/23 0500  Weight: 50.2 kg 53.4 kg 53.4 kg   BP 91/71 (BP Location: Right Arm)   Pulse 99   Temp 97.9 F  (36.6 C) (Oral)   Resp 20   Ht 5\' 1"  (1.549 m)   Wt 53.4 kg   SpO2 95%   BMI 22.24 kg/m   Patient with no chest pain or dyspnea, no lower extremity edema, PND or orthopnea.   Neurology awake and alert ENT with mild pallor Cardiovascular with S1 and S2 present, irregularly irregular with no gallops rubs or murmurs Respiratory with no rales or wheezing, no rhonchi Abdomen with no distention  No lower extremity edema   Condition at discharge: stable  The results of significant diagnostics from this hospitalization (including imaging, microbiology, ancillary and laboratory) are listed below for reference.   Imaging Studies: ECHOCARDIOGRAM COMPLETE  Result Date: 08/12/2023    ECHOCARDIOGRAM REPORT   Patient Name:   Jesus [name] Date of Exam: 08/12/2023 Medical Rec #:  [id number]        Height:       61.0 in Accession #:    [id number]       Weight:       110.6 lb Date of Birth:  [birth year]         BSA:          1.468 m Patient Age:    82 years         BP:           110/64 mmHg Patient Gender: M                HR:           81 bpm. Exam Location:  Inpatient Procedure: 2D Echo, Color Doppler and Cardiac Doppler Indications:    acute systolic chf  History:        Patient has prior history of Echocardiogram examinations, most                 recent 02/16/2022. Cardiomyopathy, Prior CABG, chronic kidney                 disease, Arrythmias:Atrial Fibrillation; Risk                 Factors:Hypertension and Dyslipidemia.                 Aortic Valve: 26 mm Edwards Sapien prosthetic, stented (TAVR)                 valve is present in the aortic position. Procedure Date: 4/16.  Sonographer:    Delcie Roch RDCS Referring Phys: [id number] KUBER GHIMIRE IMPRESSIONS  1. Left ventricular ejection fraction, by estimation, is <20%. The left ventricle has severely decreased function. The left ventricle demonstrates global hypokinesis. The left  ventricular internal cavity size was mildly dilated. Left ventricular  diastolic parameters are indeterminate.  2. Right ventricular systolic function is moderately reduced. The right ventricular size is normal. There is mildly elevated pulmonary artery systolic pressure. The estimated right ventricular systolic pressure is 35.0 mmHg.  3. Left atrial size was severely dilated.  4. Right atrial size was mildly dilated.  5. The mitral valve is normal in structure. Mild mitral valve regurgitation.  6. The aortic valve has been repaired/replaced. Aortic valve regurgitation is mild to moderate. There is a 26 mm Edwards Sapien prosthetic (TAVR) valve present in the aortic position. Procedure Date: 4/16. Echo findings are consistent with perivalvular leak of the aortic prosthesis. Vmax 2.4, MG , EOA 1.2 cm^2, DI 0.40  7. The inferior vena cava is normal in size with greater than 50% respiratory variability, suggesting right atrial pressure of 3 mmHg. FINDINGS  Left Ventricle: Left ventricular ejection fraction, by estimation, is <20%. The left ventricle has severely decreased function. The left ventricle demonstrates global hypokinesis. The left ventricular internal cavity size was mildly dilated. There is no  left ventricular hypertrophy. Left ventricular diastolic parameters are indeterminate. Right Ventricle: The right ventricular size is normal. No increase in right ventricular wall thickness. Right ventricular systolic function is moderately reduced. There is mildly elevated pulmonary artery systolic pressure. The tricuspid regurgitant velocity is 2.83 m/s, and with an assumed right atrial pressure of 3 mmHg, the estimated right ventricular systolic pressure is 35.0 mmHg. Left Atrium: Left atrial size was severely dilated. Right Atrium: Right atrial size was mildly dilated. Pericardium: There is no evidence of pericardial effusion. Mitral Valve: The mitral valve is normal in structure. Mild mitral valve regurgitation. Tricuspid Valve: The tricuspid valve is normal in structure.  Tricuspid valve regurgitation is trivial. Aortic Valve: The aortic valve has been repaired/replaced. Aortic valve regurgitation is mild to moderate. Aortic valve mean gradient measures 8.2 mmHg. Aortic valve peak gradient measures 17.3 mmHg. Aortic valve area, by VTI measures 1.08 cm. There is a 26 mm Edwards Sapien prosthetic, stented (TAVR) valve present in the aortic position. Procedure Date: 4/16. Pulmonic Valve: The pulmonic valve was not well visualized. Pulmonic valve regurgitation is mild. Aorta: The aortic root and ascending aorta are structurally normal, with no evidence of dilitation. Venous: The inferior vena cava is normal in size with greater than 50% respiratory variability, suggesting right atrial pressure of 3 mmHg. IAS/Shunts: The interatrial septum was not well visualized.  LEFT VENTRICLE PLAX 2D LVIDd:         5.80 cm LVIDs:         5.50 cm LV PW:         0.90 cm LV IVS:        1.10 cm LVOT diam:     1.80 cm LV SV:         39 LV SV Index:   27 LVOT Area:     2.54 cm  RIGHT VENTRICLE          IVC RV Basal diam:  2.90 cm  IVC diam: 1.70 cm LEFT ATRIUM             Index        RIGHT ATRIUM           Index LA diam:        4.50 cm 3.06 cm/m   RA Area:     16.80 cm LA Vol (A2C):   95.9 ml 65.31 ml/m  RA Volume:  44.50 ml  30.31 ml/m LA Vol (A4C):   66.3 ml 45.15 ml/m LA Biplane Vol: 82.8 ml 56.39 ml/m  AORTIC VALVE AV Area (Vmax):    1.10 cm AV Area (Vmean):   1.12 cm AV Area (VTI):     1.08 cm AV Vmax:           208.00 cm/s AV Vmean:          130.500 cm/s AV VTI:            0.364 m AV Peak Grad:      17.3 mmHg AV Mean Grad:      8.2 mmHg LVOT Vmax:         89.80 cm/s LVOT Vmean:        57.550 cm/s LVOT VTI:          0.155 m LVOT/AV VTI ratio: 0.43  AORTA Ao Asc diam: 3.20 cm TRICUSPID VALVE TR Peak grad:   32.0 mmHg TR Vmax:        283.00 cm/s  SHUNTS Systemic VTI:  0.16 m Systemic Diam: 1.80 cm Epifanio Lesches MD Electronically signed by Epifanio Lesches MD Signature Date/Time:  08/12/2023/7:07:09 PM    Final    DG Chest 2 View  Result Date: 08/11/2023 CLINICAL DATA:  Shortness of breath EXAM: CHEST - 2 VIEW COMPARISON:  07/12/2023 FINDINGS: Prior CABG. Heart is borderline in size. Mediastinal contours within normal limits. Aortic atherosclerosis. Small bilateral effusions with bibasilar opacities, likely atelectasis. IMPRESSION: Borderline heart size. Small bilateral effusions with bibasilar atelectasis. Electronically Signed   By: Charlett Nose M.D.   On: 08/11/2023 01:06   CT Chest Wo Contrast  Result Date: 08/01/2023 CLINICAL DATA:  Abnormal lung sounds on exam.  Atrial fibrillation. EXAM: CT CHEST WITHOUT CONTRAST TECHNIQUE: Multidetector CT imaging of the chest was performed following the standard protocol without IV contrast. RADIATION DOSE REDUCTION: This exam was performed according to the departmental dose-optimization program which includes automated exposure control, adjustment of the mA and/or kV according to patient size and/or use of iterative reconstruction technique. COMPARISON:  07/12/2023 chest radiograph. 02/13/2015 chest CT angiogram. FINDINGS: Cardiovascular: Mild-to-moderate cardiomegaly. Aortic valve prosthesis in place. No significant pericardial effusion/thickening. Three-vessel coronary atherosclerosis status post CABG. Atherosclerotic nonaneurysmal thoracic aorta. Normal caliber pulmonary arteries. Mediastinum/Nodes: Heterogeneously calcified 1.3 cm hypodense posterior right thyroid nodule. Not clinically significant; no follow-up imaging recommended (ref: J Am Coll Radiol. 2015 Feb;12(2): 143-50). Unremarkable esophagus. No pathologically enlarged axillary, mediastinal or hilar lymph nodes, noting limited sensitivity for the detection of hilar adenopathy on this noncontrast study. Lungs/Pleura: No pneumothorax. Small posterior right and trace posterior left pleural effusions. Subsolid 1.3 x 1.0 cm posterior left lower lobe pulmonary nodule (series 4/image  100). Sub solid anterior left upper lobe 0.6 cm pulmonary nodule (series 4/image 90). Mild interlobular septal thickening throughout both lungs. Mild patchy ground-glass opacity in the posterior lower lobes with some associated volume loss. No significant regions of traction bronchiectasis, architectural distortion or frank honeycombing. Mild platelike atelectasis in the dependent lower lobes. Otherwise no acute consolidative airspace disease or lung masses. Upper abdomen: Small hiatal hernia. Cholecystectomy. 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 Musculoskeletal: No aggressive appearing focal osseous lesions. Intact sternotomy wires. Mild upper thoracic spondylosis with exaggerated upper thoracic kyphosis. IMPRESSION: 1. Subsolid 1.3 cm posterior left lower lobe pulmonary nodule. Subsolid 0.6 cm anterior left upper lobe pulmonary nodule. Per Fleischner  Society Guidelines, recommend a non-contrast Chest CT at 3-6 months. Subsequent management based on the most suspicious nodule(s). These guidelines do not apply to immunocompromised patients and patients with cancer. Follow up in patients with significant comorbidities as clinically warranted. For lung cancer screening, adhere to Lung-RADS guidelines. Reference: Radiology. 2017; 284(1):228-43. 2. Mild-to-moderate cardiomegaly. Mild interlobular septal thickening throughout both lungs, compatible with mild pulmonary edema. Small posterior right and trace posterior left pleural effusions. Findings suggest mild congestive heart failure. 3. Mild patchy ground-glass opacity in the posterior lower lobes with some associated volume loss, favor hypoventilatory change. No compelling findings of interstitial lung disease. 4. Small hiatal hernia. 5.  Aortic Atherosclerosis (ICD10-I70.0). Electronically Signed   By: Delbert Phenix M.D.   On: 08/01/2023 22:27     Microbiology: Results for orders placed or performed during the hospital encounter of 07/07/23  Culture, blood (Routine X 2) w Reflex to ID Panel     Status: None   Collection Time: 07/12/23  4:05 AM   Specimen: BLOOD RIGHT ARM  Result Value Ref Range Status   Specimen Description BLOOD RIGHT ARM  Final   Special Requests   Final    BOTTLES DRAWN AEROBIC AND ANAEROBIC Blood Culture adequate volume   Culture   Final    NO GROWTH 5 DAYS Performed at [facility], [address], [place], [place] [address]    Report Status 07/17/2023 FINAL  Final  Culture, blood (Routine X 2) w Reflex to ID Panel     Status: None   Collection Time: 07/12/23  4:08 AM   Specimen: BLOOD RIGHT HAND  Result Value Ref Range Status   Specimen Description BLOOD RIGHT HAND  Final   Special Requests   Final    BOTTLES DRAWN AEROBIC AND ANAEROBIC Blood Culture adequate volume   Culture   Final    NO GROWTH 5 DAYS Performed at [facility], [address], [place], [place] [address]    Report Status 07/17/2023 FINAL  Final    Labs: CBC: Recent Labs  Lab 08/11/23 0016 08/11/23 0934 08/12/23 0406 08/13/23 0408 08/14/23 0438  WBC 9.7 8.1 8.1 7.2 8.3  HGB 12.5* 12.9* 12.5* 12.4* 12.8*  HCT 38.4* 39.6 37.6* 36.8* 39.1  MCV 103.2* 99.2 100.0 97.4 98.2  PLT 187 186 167 175 184   Basic Metabolic Panel: Recent Labs  Lab 08/11/23 0016 08/11/23 0934 08/12/23 0406 08/13/23 0408 08/14/23 0433 08/14/23 0438  NA 135 137 135 138  --  138  K 5.2* 4.5 3.4* 3.4*  --  4.2  CL 100 101 97* 95*  --  96*  CO2 22 27 27 27   --  28  GLUCOSE 128* 139* 97 98  --  111*  BUN 26* 26* 28* 35*  --  42*  CREATININE 1.50* 1.70* 1.55* 1.67*  --  1.76*  CALCIUM 8.7* 8.6* 8.3* 8.4*  --  8.6*  MG  --  2.2  --   --  2.2  --    Liver Function Tests: Recent Labs  Lab 08/11/23 0016 08/11/23 0934  AST 36 34  ALT 47* 46*  ALKPHOS 97 97  BILITOT 1.3* 1.3*  PROT 6.7 6.8  ALBUMIN 3.3* 3.3*   CBG: No  results for input(s): "GLUCAP" in the last 168 hours.  Discharge time spent: greater than 30 minutes.  Signed: Coralie Keens, MD [facility] 08/14/2023

## 2023-08-14 NOTE — Progress Notes (Addendum)
Patient Name: Jesus [name] Date of Encounter: 08/14/2023 [facility] Cardiologist: [name], MD   Interval Summary  .    Patient feeling well today. No chest pain. Breathing improved and he denies orthopnea. No palpitations. Cannot tell that he is in afib   Vital Signs .    Vitals:   08/13/23 1552 08/13/23 2052 08/14/23 0348 08/14/23 0500  BP:  96/73 91/71   Pulse:  97    Resp:  20 18   Temp: 98.1 F (36.7 C) 98.4 F (36.9 C) 98.1 F (36.7 C)   TempSrc: Oral Oral Oral   SpO2:  95% 95%   Weight:    53.4 kg  Height:        Intake/Output Summary (Last 24 hours) at 08/14/2023 0756 Last data filed at 08/13/2023 2106 Gross per 24 hour  Intake 123 ml  Output 100 ml  Net 23 ml      08/14/2023    5:00 AM 08/13/2023    6:01 AM 08/12/2023    8:53 AM  Last 3 Weights  Weight (lbs) 117 lb 11.6 oz 117 lb 11.6 oz 110 lb 9.6 oz  Weight (kg) 53.4 kg 53.4 kg 50.168 kg      Telemetry/ECG    Atrial fibrillation with HR in the 90s-100s  - Personally Reviewed  Physical Exam .   GEN: No acute distress. Sitting comfortably in the bed  Neck: No JVD Cardiac: Irregular rate and rhythm. Grade2/6 systolic murmur at LUSB. Radial pulses 2+ bilaterally  Respiratory: Crackles in bilateral lung bases. Normal work of breathing on room air  GI: Soft, nontender, non-distended  MS: No edema in BLE   Assessment & Plan .     Acute on Chronic HFrEF - Echocardiogram 08/12/23 showed EF <20%, moderately reduced RV function. EF was previously 35-40% in 02/2022  - In the ED, BNP elevated to 3549. CXR with small bilateral effusions  - He has been on IV lasix 40 mg BID- I/Os incomplete. Creatinine trending upwards and is elevated to 1.76 this AM  - Patient euvolemic on exam today. Normal work of breathing on room air. Stop IV lasix and start PO lasix 20 mg daily  - Dr. Eden Emms discussed catheterization with patient yesterday to assess for worsening CAD- patient declined and did not want invasive  procedures.  - Patient is DNR. Does not want invasive procedures. Not a candidate for BiV AICD due to age. Agree with palliative care involvement.  - Possible that afib contributing to reduced EF- he has been on coumadin as an outpatient. INR was 1.9 on 9/7. Willing to discuss TEE/DCCV, but is unsure if he wants any procedures.  - Palliative care has a meeting scheduled today at 11 AM   Paroxysmal Atrial Fibrillation  - HR fairly well controlled- in the 90s-100s per telemetry  - Continue amiodarone 200 mg daily  - Continue metoprolol tartrate 12.5 mg BID - BP too low to increase further. plan to transition to metoprolol succinate prior to DC.  - Continue coumadin dosed per pharmacy  - Possible that afib with RVR contributed to reduced EF/CHF exacerbation. Could consider DCCV this admission. Note INR was 1.9 on admission so he might need TEE. Patient willing to discuss DCCV but has palliative care meeting today to better establish goals of care. Will follow up after palliative care meeting   CAD s/p CABG - Patient previously underwent CABG x6 in 1997 with LIMA to diag-LAD, SVG to OM1-OM2-OM3, SVG to [facility] -  Most recent cath in 02/2022 showed severe native vessel CAD with 5/6 patent grafts. There was severe stenosis of the proximal body of the SVG-PDA that was treated with PTCA/DESx1  - No chest pain  - Continue ASA, lipitor, metoprolol   S/p AVR - Echocardiogram this admission showed a 26 mm Edwards Sapien prosthetic (TAVR) valve in the aortic position. Mild-moderate aortic valve regurgitation. Echo findings consistent with perivalvular leak of the aortic prosthesis   For questions or updates, please contact [facility] Please consult [email or web address] for contact info under        Signed, Jonita Albee, PA-C   Patient seen, examined. Available data reviewed. Agree with findings, assessment, and plan as outlined by Marijean Niemann, PA-C.  Patient's family is at the bedside.   They have just met with the palliative care team.  The patient has expressed wishes for limited medical interventions and DNR status.  On exam today, he is alert, oriented, pleasant elderly male in no distress.  HEENT is normal, JVP is normal, lungs are clear bilaterally, heart is irregularly irregular with a 2/6 ejection murmur at the right upper sternal border, abdomen is soft and nontender, extremities have no edema, skin is warm and dry with no rash.  Telemetry is reviewed and shows atrial fibrillation currently with heart rate in the 80s.  We discussed potential interventions such as cardioversion for atrial fibrillation.  The patient wishes for continued medical therapy which I think is fully appropriate.  I think he would have a high risk of recurrent A-fib even with successful cardioversion.  He has severe left atrial enlargement.  Overall, we will plan on rate control of his atrial fibrillation.  He will continue anticoagulation with warfarin.  We will consolidate his metoprolol as outlined above.  He remains on a minimal dose of lisinopril.  Blood pressure will not allow for further medicine titration.  I will cancel his warfarin visit tomorrow and we will reschedule this for next week.  I will add him onto my office schedule next Tuesday which is the best day for him to come into the office per his family.  I have asked him to avoid driving until he sees me back in the office.  He actually wants to return to work as he really likes getting out each day for a few hours to his job site.  He appears to express good understanding about his cardiac condition and favors conservative therapy moving forward.  Patient is okay for discharge from a cardiac perspective.  Again, I will see him in the office next Tuesday and we will reschedule his Coumadin clinic appointment.  [name], M.D. 08/14/2023 11:48 AM

## 2023-08-14 NOTE — Assessment & Plan Note (Signed)
Continue with statin therapy.  ?

## 2023-08-14 NOTE — Assessment & Plan Note (Signed)
Hgb has been stable., Continue with iron supplementation,

## 2023-08-14 NOTE — TOC Initial Note (Signed)
Transition of Care [facility]) - Initial/Assessment Note    Patient Details  Name: Jesus [name] MRN: [id number] Date of Birth: 03/29/1941  Transition of Care [facility]) CM/SW Contact:    Gala Lewandowsky, RN Phone Number: 08/14/2023, 1:16 PM  Clinical Narrative:  Risk for readmission assessment completed. PTA patient was from home with support of family. Patient is currently active with [facility] for PT/OT-added RN for disease management. MD aware to place resumption orders. Frances Furbish is aware of discharge today. Case Manager discussed outpatient palliative services and the patient is agreeable to [name]. Referral submitted and the office will reach out to the family with a visit time. Patient has transportation home vis family. No further home needs identified at this time.              Expected Discharge Plan: Home w Home Health Services Barriers to Discharge: No Barriers Identified   Patient Goals and CMS Choice Patient states their goals for this hospitalization and ongoing recovery are:: to return home   Expected Discharge Plan and Services   Discharge Planning Services: CM Consult Post Acute Care Choice: Home Health, Resumption of Svcs/PTA Provider Living arrangements for the past 2 months: Single Family Home Expected Discharge Date: 08/14/23                  HH Arranged: RN, Disease Management, PT, OT HH Agency: [facility] Date [facility] Agency Contacted: 08/14/23 Time HH Agency Contacted: [phone] Representative spoke with at [facility] Agency: Kandee Keen  Prior Living Arrangements/Services Living arrangements for the past 2 months: Single Family Home   Patient language and need for interpreter reviewed:: Yes Do you feel safe going back to the place where you live?: Yes      Need for Family Participation in Patient Care: Yes (Comment) Care giver support system in place?: Yes (comment)      Activities of Daily Living Home Assistive Devices/Equipment: Walker  (specify type), Built-in shower seat, Grab bars in shower, Hearing aid, Long-handled shoehorn, Reacher, Scales, Eyeglasses, Blood pressure cuff, Bedside commode/3-in-1 ADL Screening (condition at time of admission) Patient's cognitive ability adequate to safely complete daily activities?: Yes Is the patient deaf or have difficulty hearing?: Yes Does the patient have difficulty seeing, even when wearing glasses/contacts?: No Does the patient have difficulty concentrating, remembering, or making decisions?: Yes (forgetful) Patient able to express need for assistance with ADLs?: Yes Does the patient have difficulty dressing or bathing?: No Independently performs ADLs?: Yes (appropriate for developmental age) (independent with walker at home) Does the patient have difficulty walking or climbing stairs?: Yes Weakness of Legs: Both Weakness of Arms/Hands: None  Permission Sought/Granted Permission sought to share information with : Family Supports, Case Estate manager/land agent granted to share information with : Yes, Verbal Permission Granted     Permission granted to share info w AGENCY: [place].        Emotional Assessment Appearance:: Appears stated age Attitude/Demeanor/Rapport: Engaged Affect (typically observed): Appropriate Orientation: : Oriented to Self, Oriented to Place Alcohol / Substance Use: Not Applicable Psych Involvement: No (comment)  Admission diagnosis:  Acute on chronic combined systolic and diastolic congestive heart failure (HCC) [I50.43] Acute on chronic combined systolic and diastolic CHF (congestive heart failure) (HCC) [I50.43] Patient Active Problem List   Diagnosis Date Noted   Pressure injury of skin 08/14/2023   Acute on chronic systolic CHF (congestive heart failure) (HCC) 08/11/2023   Prediabetes 08/02/2023   Rectal polyp 07/11/2023  Coagulopathy (HCC) 07/09/2023   BRBPR (bright red blood per rectum) 07/09/2023   Lower GI bleed  07/07/2023   Bilateral rales 10/25/2022   VT (ventricular tachycardia) (HCC) - 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-largely asymptomatic 10/04/2022   A-fib (HCC) 04/01/2022   Bradycardia 03/15/2022   Debility 03/08/2022   Acute renal failure 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 (HCC) 03/01/2022   Acute on chronic systolic congestive heart failure (HCC) 02/27/2022   Paroxysmal atrial fibrillation (HCC) 02/20/2022   S/P angioplasty with stent 02/17/22 DES to proximal VG to PDA 02/20/2022   NSTEMI (non-ST elevated myocardial infarction) (HCC) 02/15/2022   Chronic systolic CHF (congestive heart failure) (HCC) 04/16/2018   Long term (current) use of anticoagulants 08/25/2017   Actinic keratoses 03/21/2017   Internal hemorrhoid, bleeding 07/01/2015   Rectal bleeding 06/15/2015   Internal hemorrhoids 06/15/2015   Other constipation 06/15/2015   Chronic anticoagulation-Couamdin 03/12/2015   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   Iron deficiency anemia 09/03/2014   Encounter for therapeutic drug monitoring 12/31/2013   Yellow jacket sting 08/06/2013   Low back pain on right side with sciatica 04/24/2013   Severe aortic stenosis 08/10/2012   Coronary artery disease involving coronary bypass graft of native heart without angina pectoris 08/10/2012   Left ventricular dysfunction 08/10/2012   Routine health maintenance 07/12/2012   BEE STING REACTION, LOCAL 09/21/2010   BEN LOC HYPERPLASIA PROS W/UR OBST & OTH LUTS 08/02/2010   Acute and chronic cholecystitis 02/01/2010   Abdominal pain, generalized 01/29/2010   CORONARY ATHEROSCLEROSIS NATIVE CORONARY ARTERY 10/13/2009   HYPERTHYROIDISM 05/22/2009   INSOMNIA 05/22/2009   Mixed hyperlipidemia 04/22/2009   Cardiomyopathy, ischemic-EF 35% 04/22/2009   CAD, AUTOLOGOUS BYPASS GRAFT 10/16/2008   Essential hypertension 09/07/2007   Aortic valve disorder 09/07/2007    GERD without esophagitis 09/07/2007   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   PCP:  Kristian Covey, MD Pharmacy:   [facility] [phone] - [place], [place] - [address] [place] [address] Phone: [phone] Fax: [phone]     Social Determinants of Health (SDOH) Social History: SDOH Screenings   Food Insecurity: No Food Insecurity (08/11/2023)  Housing: Low Risk  (08/11/2023)  Transportation Needs: No Transportation Needs (08/11/2023)  Utilities: Not At Risk (08/11/2023)  Alcohol Screen: Low Risk  (08/03/2023)  Depression (PHQ2-9): Low Risk  (08/03/2023)  Financial Resource Strain: Low Risk  (08/03/2023)  Physical Activity: Sufficiently Active (08/03/2023)  Social Connections: Socially Integrated (08/03/2023)  Stress: No Stress Concern Present (08/03/2023)  Tobacco Use: Low Risk  (08/11/2023)  Health Literacy: Adequate Health Literacy (08/03/2023)   SDOH Interventions:     Readmission Risk Interventions    08/14/2023    1:13 PM 07/12/2023    9:03 AM 03/04/2022    3:49 PM  Readmission Risk Prevention Plan  Post Dischage Appt  Complete   Medication Screening  Complete   Transportation Screening Complete Complete Complete  PCP or Specialist Appt within 3-5 Days Complete    HRI or Home Care Consult Complete    Social Work Consult for Recovery Care Planning/Counseling Complete    Palliative Care Screening Complete    Medication Review Oceanographer) Complete  Complete  PCP or Specialist appointment within 3-5 days of discharge   Complete  HRI or Home Care Consult   Complete  SW Recovery Care/Counseling Consult   Complete  Palliative Care Screening   Not Applicable  Skilled Nursing Facility   Complete

## 2023-08-14 NOTE — Assessment & Plan Note (Addendum)
Echocardiogram with reduced LV systolic function with EF <20%, global hypokinesis, mild dilated LV cavity, RV systolic function with moderate reduction, RVSP 35 mmHg, LA with severe dilatation, RA with mild dilatation, aortic valve sp TAVR with mild to moderate regurgitation.   Patient was placed on IV furosemide with improvement in volume status.   Plan to continue medical therapy with lisinopril and metoprolol. Diuresis with furosemide 20 mg daily and SGLT 2 inh.  Limited pharmacologic therapy due to acute reduction in GFR .

## 2023-08-14 NOTE — Consult Note (Signed)
   [facility]  [facility] Inpatient Consult   08/14/2023  Jesus [name] 12-20-1940 [id number]  [facility] [[facility]]  Accountable Care Organization [ACO] Patient: [name]  Chart reviewed and reveals the patient currently transitioning to Home with Hospice noted per progress notes.  Plan: Patient will have full case management services through Hospice and needs will be met at the hospice level of care. No [facility] Care Management is planned for transitional needs. Will sign off at transition from hospital.  For questions,   Charlesetta Shanks, RN BSN CCM [facility]  [phone] business mobile phone Toll free office [phone]  *Concierge Line  [phone] Fax number: [phone] [email or web address]

## 2023-08-14 NOTE — Assessment & Plan Note (Signed)
No active chest pain. No acute coronary syndrome.   Continue with aspirin and statin.

## 2023-08-14 NOTE — Progress Notes (Signed)
Physical Therapy Treatment Patient Details Name: Jesus [name] MRN: [id number] DOB: 1941/06/04 Today's Date: 08/14/2023   History of Present Illness Pt is an 82 y/o M presenting to ED on 9/5 with worsening LE edema/dyspnea, admitted for acute on chronic combined systolic and diastolic CHF. PMH includes CAD s/p CABG and subsequent stents, PAF on coumadin, severe AS s/p TAVR, CKD II, chronic combined systolic and diastolic CHF    PT Comments  Pt pleasant and eager to mobilize in hallway. Pt expresses desire to practice with SPC, pt requires max cuing for cane sequencing and is mildly unsteady but no PT assist needed beyond supervision. HHPT remains appropriate recommendation, PT to continue to follow.      If plan is discharge home, recommend the following: A little help with walking and/or transfers;Assistance with cooking/housework;Help with stairs or ramp for entrance   Can travel by private vehicle        Equipment Recommendations  None recommended by PT    Recommendations for Other Services       Precautions / Restrictions Precautions Precautions: Fall Restrictions Weight Bearing Restrictions: No     Mobility  Bed Mobility Overal bed mobility: Needs Assistance Bed Mobility: Supine to Sit     Supine to sit: Modified independent (Device/Increase time), Used rails, HOB elevated          Transfers Overall transfer level: Needs assistance Equipment used: Straight cane Transfers: Sit to/from Stand Sit to Stand: Contact guard assist           General transfer comment: slow to rise and sit    Ambulation/Gait Ambulation/Gait assistance: Contact guard assist Gait Distance (Feet): 400 Feet Assistive device: Straight cane Gait Pattern/deviations: Step-through pattern, Decreased stride length, Trunk flexed, Drifts right/left Gait velocity: decr     General Gait Details: max verbal and demonstrative cues for sequencing [facility]   Stairs             Wheelchair  Mobility     Tilt Bed    Modified Rankin (Stroke Patients Only)       Balance Overall balance assessment: Needs assistance Sitting-balance support: Feet supported, No upper extremity supported Sitting balance-Leahy Scale: Good     Standing balance support: During functional activity Standing balance-Leahy Scale: Fair Standing balance comment: at least single UE use for balance                            Cognition Arousal: Alert Behavior During Therapy: WFL for tasks assessed/performed Overall Cognitive Status: Impaired/Different from baseline Area of Impairment: Following commands, Problem solving                       Following Commands: Follows one step commands with increased time     Problem Solving: Slow processing, Decreased initiation, Difficulty sequencing General Comments: difficulty with sequencing gait with cane, requires repeated cuing        Exercises      General Comments General comments (skin integrity, edema, etc.): HRmax observed 106 bpm      Pertinent Vitals/Pain Pain Assessment Pain Assessment: No/denies pain    Home Living                          Prior Function            PT Goals (current goals can now be found in the care plan section) Acute Rehab PT Goals  Patient Stated Goal: to go home PT Goal Formulation: With patient Time For Goal Achievement: 08/27/23 Potential to Achieve Goals: Good Progress towards PT goals: Progressing toward goals    Frequency    Min 1X/week      PT Plan      Co-evaluation              AM-PAC PT "6 Clicks" Mobility   Outcome Measure  Help needed turning from your back to your side while in a flat bed without using bedrails?: None Help needed moving from lying on your back to sitting on the side of a flat bed without using bedrails?: None Help needed moving to and from a bed to a chair (including a wheelchair)?: A Little Help needed standing up from a  chair using your arms (e.g., wheelchair or bedside chair)?: A Little Help needed to walk in hospital room?: A Little Help needed climbing 3-5 steps with a railing? : A Little 6 Click Score: 20    End of Session Equipment Utilized During Treatment: Gait belt Activity Tolerance: Patient limited by fatigue Patient left: with call bell/phone within reach;in bed;with bed alarm set;with family/visitor present (sitting EOB ordering lunch upon PT exit) Nurse Communication: Mobility status PT Visit Diagnosis: Unsteadiness on feet (R26.81);Muscle weakness (generalized) (M62.81)     Time: 2956-2130 PT Time Calculation (min) (ACUTE ONLY): 16 min  Charges:    $Gait Training: 8-22 mins PT General Charges $$ ACUTE PT VISIT: 1 Visit                     Marye Round, PT DPT Acute Rehabilitation Services Secure Chat Preferred  Office [phone]    Jesus [name] E Jesus [name] 08/14/2023, 12:18 PM

## 2023-08-14 NOTE — Telephone Encounter (Signed)
Lucendia Herrlich called to report pt was d/c hospital today for CHF. Pt was on phone with Lucendia Herrlich. He is currently home and doing better. She reports cardiology said he does not need to check INR this week since he had INR checked today and it was 2.4.  Requested pt RS coumadin clinic apt for next week. Both agreed to RS for 9/17 at 2 pm, or whenever they are finished with the apt with Dr. Excell Seltzer.  Inquired if anything is needed before apt next week. Both denies any concerns or needs. Advised if any changes to contact coumadin clinic. Both verbalized understanding..  Coumadin clinic apt for tomorrow was cancelled and RS for 9/17.

## 2023-08-14 NOTE — Assessment & Plan Note (Signed)
Hypokalemia.   Patient had improvement in volume status, renal function at the time of his discharge with serum cr at 1,76 with K at 4,2 and serum bicarbonate 28. Na 138   K has been corrected.  Plan to continue diuresis with furosemide.  Follow up renal function and electrolytes as outpatient.

## 2023-08-14 NOTE — Consult Note (Signed)
Palliative Medicine Inpatient Consult Note  Consulting Provider:  Dorcas Carrow, MD   Reason for consult:   Palliative Care Consult Services Palliative Medicine Consult  Reason for Consult? goal of care , home hospice appropriate ?   08/14/2023  HPI:  Per intake H&P --> Jesus [name] is a pleasant 82 y.o. male with medical history significant for CAD status post remote CABG and subsequent stents, PAF on Coumadin, severe AS status post TAVR, CKD stage II, and chronic combined systolic and diastolic CHF who presents with worsening exertional dyspnea and leg swelling.   The palliative care team has been asked to get involved to discuss goals of care in the setting of severe heart failure.  Clinical Assessment/Goals of Care:  *Please note that this is a verbal dictation therefore any spelling or grammatical errors are due to the "Dragon Medical One" system interpretation.  I have reviewed medical records including EPIC notes, labs and imaging, received report from bedside RN, assessed the patient who is sitting in bed in no acute distress.    I met with Jesus [name] his nieces Jesus [name], and sister-in-law Jesus [name] to further discuss diagnosis prognosis, GOC, EOL wishes, disposition and options.   I introduced Palliative Medicine as specialized medical care for people living with serious illness. It focuses on providing relief from the symptoms and stress of a serious illness. The goal is to improve quality of life for both the patient and the family.  Medical History Review and Understanding:  A review of Jesus [name]'s past medical history was held inclusive of his history of coronary artery disease, paroxysmal atrial fibrillation, heart failure, severe aortic stenosis, and chronic kidney disease.  Social History:  Jesus [name] shares with me that he is from [facility].  He is a widower.  He has no children though considers his nieces to be surrogate children.  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  He is a man of faith and practices within Christianity.  Functional and Nutritional State:  Preceding hospitalization [name] was fully functional of his B ADLs.  His sister-in-law Jesus [name] did help with paying bills and filling out checks.  His family did provide him with meals in his refrigerator.  He did have a caregiver from 8:30 in the morning to 12 in the afternoon as well as from 5:30 in the evening to 7:30 in the evening.  He does have a walker at home though does not use it often.  Does have a fairly decent appetite  Advance Directives:  A detailed discussion was had today regarding advanced directives.  Jesus [name] does have advanced directives and a copy has been obtained to scanned into his electronic medical record.  Code Status:  Concepts specific to code status, artifical feeding and hydration, continued IV antibiotics and rehospitalization was had.  The difference between a aggressive medical intervention path  and a palliative comfort care path for this patient at this time was had.   Jesus [name] is an established DO NOT RESUSCITATE DO NOT INTUBATE CODE STATUS.  I was able to speak to Jesus [name] and his family about completion of a MOST form which we did this afternoon wishes as below:  Cardiopulmonary Resuscitation: Do Not Attempt Resuscitation (DNR/No CPR)  Medical Interventions: Limited Additional Interventions: Use medical treatment, IV fluids and cardiac monitoring as indicated, DO NOT USE intubation or mechanical ventilation. May consider use of less invasive airway support such as BiPAP or CPAP.  Also provide comfort measures. Transfer to the hospital if indicated. Avoid intensive care.   Antibiotics: Determine use of limitation of antibiotics when infection occurs  IV Fluids: IV fluids for a defined trial period  Feeding Tube: Feeding tube for a defined trial period   Discussion:  I discussed with  Jesus [name] and his family the progressive stages of heart failure and what he can expect moving into the future.  I provided education on diet and signs to look out for in terms of volume overload.  We reviewed heart failure is a disease that eventually gets to a point where he even excellent management can become less efficient.  I shared that often when there are phases of recurrent rehospitalization's hospice is a consideration as this would show Jesus [name]'s heart function had gotten to a point whereby he could not sustain on medical management.  I described hospice as a service for patients who have a life expectancy of 6 months or less. The goal of hospice is the preservation of dignity and quality at the end phases of life.  Under hospice care, the focus changes from curative to symptom relief.   We further reviewed additional support upon discharge and I introduced home palliative services which patient is interested in pursuing once he leaves the hospital.  Jesus [name] is clear about his goals which are to maintain a level of independence and functionality.  He is hopeful to go back to work though he will need to be cleared by Jesus [name] of cardiology prior to doing so.   Discussed the importance of continued conversation with family and their  medical providers regarding overall plan of care and treatment options, ensuring decisions are within the context of the patients values and GOCs.  Decision Maker: Jesus [name] (Niece): [phone] (Mobile)   SUMMARY OF RECOMMENDATIONS   DNAR/DNI  MOST Completed, paper copy placed onto the chart electric copy can be found in [facility]  DNR Form Completed, paper copy placed onto the chart electric copy can be found in [facility]  Advanced Directives in [facility]  OP Palliative support on discharge  Plan for discharge today  Code Status/Advance Care Planning: DNAR/DNI  Palliative Prophylaxis:  Aspiration, Bowel Regimen, Delirium Protocol, Frequent Pain  Assessment, Oral Care, Palliative Wound Care, and Turn Reposition  Additional Recommendations (Limitations, Scope, Preferences): Continue current measures  Psycho-social/Spiritual:  Desire for further Chaplaincy support: Yes Additional Recommendations: Education heart failure progression.    Prognosis: High 12 month mortality risk.   Discharge Planning: Discharge to home with [facility].  Vitals:   08/14/23 0348 08/14/23 0839  BP: 91/71   Pulse:  99  Resp: 18 20  Temp: 98.1 F (36.7 C) 97.9 F (36.6 C)  SpO2: 95%     Intake/Output Summary (Last 24 hours) at 08/14/2023 1237 Last data filed at 08/14/2023 0900 Gross per 24 hour  Intake 143 ml  Output --  Net 143 ml   Last Weight  Most recent update: 08/14/2023  6:27 AM    Weight  53.4 kg (117 lb 11.6 oz)            Gen:  Elderly Caucasian M in NAD HEENT: moist mucous membranes CV: Regular rate and irregular rhythm  PULM: On RA, breathing is even and nonlabored ABD: soft/nontender  EXT: No edema  Neuro: Alert and oriented x3   PPS: 60%   This conversation/these recommendations were discussed with patient primary care team, Jesus [name]  Total Time: 39 Billing based on MDM: High  Problems Addressed:  One acute or chronic illness or injury that poses a threat to life or bodily function  Amount and/or Complexity of Data: Category 3:Discussion of management or test interpretation with external physician/other qualified health care professional/appropriate source (not separately reported)  Risks: Decision not to resuscitate or to de-escalate care because of poor prognosis ______________________________________________________ Lamarr Lulas [facility] Palliative Medicine Team Team Cell Phone: [phone] Please utilize secure chat with additional questions, if there is no response within 30 minutes please call the above phone number  Palliative Medicine Team providers are available by phone from 7am to 7pm daily and can  be reached through the team cell phone.  Should this patient require assistance outside of these hours, please call the patient's attending physician.

## 2023-08-14 NOTE — Progress Notes (Signed)
ANTICOAGULATION CONSULT NOTE   Pharmacy Consult for warfarin Indication: atrial fibrillation  Allergies  Allergen Reactions   Iodine Swelling and Other (See Comments)    Skin swells- CAN DRINK BOOST AND ENSURE WITH NO ISSUES   Amlodipine Swelling and Other (See Comments)    Swelling?   Codeine Other (See Comments)    Reaction not recalled   Vital Signs: Temp: 98.1 F (36.7 C) (09/09 0348) Temp Source: Oral (09/09 0348) BP: 91/71 (09/09 0348) Pulse Rate: 97 (09/08 2052)  Labs: Recent Labs    08/12/23 0406 08/12/23 0843 08/13/23 0408 08/14/23 0438  HGB 12.5*  --  12.4* 12.8*  HCT 37.6*  --  36.8* 39.1  PLT 167  --  175 184  LABPROT  --  21.9* 24.5* 26.7*  INR  --  1.9* 2.2* 2.4*  CREATININE 1.55*  --  1.67* 1.76*    Estimated Creatinine Clearance: 23.1 mL/min (A) (by C-G formula based on SCr of 1.76 mg/dL (H)).  Assessment: Jesus [name] is a 82 y.o. year old male admitted on 08/10/2023 with concern for HF exacerbation. On warfarin prior to admission, last dose PTA 9/5. Prior to admission warfarin regimen 2 mg every Mon, Wed, Fri; 4 mg all other days. Noted patient was also on amiodarone PTA, this has been continued and increased this admission.  Initially consulted for heparin dosing with anticipated [facility] in the setting of worsening CHF but patient declined and warfarin was restarted.  9/9: INR 2.4, therapeutic on home regimen. CBC stable (Hgb 12.8, Plt 184).  Goal of Therapy:  INR 2-3 Monitor platelets by anticoagulation protocol: Yes   Plan:  Warfarin 2 mg x1 Daily INR and bleeding assessments   Trixie Rude, PharmD Clinical Pharmacist 08/14/2023  7:29 AM

## 2023-08-14 NOTE — Hospital Course (Addendum)
Mr. Alsbrooks was admitted to the hospital with the working diagnosis of heart failure exacerbation.   82 yo male with the past medical history of coronary artery disease, paroxysmal atrial fibrillation, CKD, and AS sp TAVR who presented with dyspnea and lower extremity edema. Reported having lower extremity edema one week prior to his admission, he was prescribed furosemide for 3 days with improvement in his symptoms. Over the next 4 days he had recurrent lower extremity edema and now associated with worsening dyspnea prompting him to come to the hospital. On his initial physical examination his blood pressure was 115/77, HR 107, RR 19 and 02 saturation 95%, lungs with no wheezing or rales, heart with S1 and S2 present and regular, abdomen with no distention, positive lower extremity edema.   Na 135, K 5,2 CL 100, bicarbonate 100, glucose 128 bun 26 cr 1,50  BNP 3,549  High sensitive troponin 20 and 20  Wbc 9,7 hgb 12.5 plt 187  Urine analysis SG 1,007, negative protein, glucose >500, negative leukocytes and negative hgb.   Chest radiograph with cardiomegaly, bilateral hilar vascular congestion, fluid in the right fissure, bilateral pleural effusions.   EKG 107 bpm, right axis deviation, qtc 542, left bundle brunch block, atrial fibrillation rhythm, no significant ST segment changes, negative T wave lead II, III, and AvF, (chronic), positive LVH.     Patient was placed on IV furosemide for diuresis. Echocardiogram with reduced LV systolic function.  Conservative medical therapy per patient's request.   09/09 patient with improvement in volume status, plan to follow up as outpatient.  Continue oral diuretic therapy at home.

## 2023-08-14 NOTE — Assessment & Plan Note (Signed)
Continue levothyroxine 

## 2023-08-14 NOTE — Assessment & Plan Note (Signed)
Continue pantoprazole. °

## 2023-08-14 NOTE — Assessment & Plan Note (Signed)
Continue rate control with amiodarone and metoprolol. Anticoagulation with warfarin.  Discharge INR is 2.4

## 2023-08-14 NOTE — Care Management Important Message (Signed)
Important Message  Patient Details  Name: Jesus [name] MRN: [id number] Date of Birth: 1941-08-14   Medicare Important Message Given:  Yes     Jaylena Holloway 08/14/2023, 3:10 PM

## 2023-08-14 NOTE — Assessment & Plan Note (Signed)
Continue blood pressure control with metoprolol and  lisinopril

## 2023-08-14 NOTE — Care Management Important Message (Signed)
Important Message  Patient Details  Name: Jesus [name] MRN: [id number] Date of Birth: [birth year]   Medicare Important Message Given:  Yes     Sherilyn Banker 08/14/2023, 2:20 PM

## 2023-08-15 ENCOUNTER — Telehealth: Payer: Self-pay | Admitting: Family Medicine

## 2023-08-15 ENCOUNTER — Ambulatory Visit: Payer: Medicare Other

## 2023-08-15 NOTE — Progress Notes (Signed)
Patient informed that order was placed.

## 2023-08-15 NOTE — Telephone Encounter (Signed)
Jesus [name] informed of the message below and voiced understanding

## 2023-08-15 NOTE — Telephone Encounter (Signed)
Drenda Freeze with [facility] is calling and requesting VO for palliative care for this patient

## 2023-08-17 ENCOUNTER — Telehealth: Payer: Self-pay

## 2023-08-17 DIAGNOSIS — I5022 Chronic systolic (congestive) heart failure: Secondary | ICD-10-CM

## 2023-08-17 DIAGNOSIS — I255 Ischemic cardiomyopathy: Secondary | ICD-10-CM

## 2023-08-17 NOTE — Telephone Encounter (Signed)
-----   Message from Sherryl Manges sent at 08/08/2023  4:12 PM EDT ----- Please Inform Patient that CT  is abnormal and will require further eval with repeat imaging at 3-6 months [facility]  can you followup on this  Thanks Also M can we get  BNP on him

## 2023-08-17 NOTE — Telephone Encounter (Signed)
Spoke with pt and advised of CT result and recommendation per Dr Graciela Husbands.  Pt to see Dr Excell Seltzer on 08/22/2023 and will ask that he have BNP drawn at that appt.  Pt verbalizes understanding and agrees with current plan.

## 2023-08-22 ENCOUNTER — Encounter: Payer: Self-pay | Admitting: Cardiovascular Disease

## 2023-08-22 ENCOUNTER — Other Ambulatory Visit: Payer: Medicare Other

## 2023-08-22 ENCOUNTER — Ambulatory Visit: Payer: Medicare Other | Attending: Cardiovascular Disease | Admitting: Cardiovascular Disease

## 2023-08-22 ENCOUNTER — Ambulatory Visit: Payer: Medicare Other

## 2023-08-22 VITALS — BP 120/60 | HR 55 | Ht 61.0 in | Wt 118.2 lb

## 2023-08-22 DIAGNOSIS — Z79899 Other long term (current) drug therapy: Secondary | ICD-10-CM

## 2023-08-22 DIAGNOSIS — I4819 Other persistent atrial fibrillation: Secondary | ICD-10-CM

## 2023-08-22 DIAGNOSIS — Z7901 Long term (current) use of anticoagulants: Secondary | ICD-10-CM

## 2023-08-22 DIAGNOSIS — I5022 Chronic systolic (congestive) heart failure: Secondary | ICD-10-CM

## 2023-08-22 NOTE — Patient Instructions (Addendum)
Medication Instructions:  Your physician recommends that you continue on your current medications as directed. Please refer to the Current Medication list given to you today.  *If you need a refill on your cardiac medications before your next appointment, please call your pharmacy*  Lab Work: BMET, pro-BNP, INR today If you have labs (blood work) drawn today and your tests are completely normal, you will receive your results only by: MyChart Message (if you have MyChart) OR A paper copy in the mail If you have any lab test that is abnormal or we need to change your treatment, we will call you to review the results.  Testing/Procedures: ECHO in 6 weeks Your physician has requested that you have an echocardiogram. Echocardiography is a painless test that uses sound waves to create images of your heart. It provides your doctor with information about the size and shape of your heart and how well your heart's chambers and valves are working. This procedure takes approximately one hour. There are no restrictions for this procedure. Please do NOT wear cologne, perfume, aftershave, or lotions (deodorant is allowed). Please arrive 15 minutes prior to your appointment time.  Follow-Up: At [facility], you and your health needs are our priority.  As part of our continuing mission to provide you with exceptional heart care, we have created designated Provider Care Teams.  These Care Teams include your primary Cardiologist (physician) and Advanced Practice Providers (APPs -  Physician Assistants and Nurse Practitioners) who all work together to provide you with the care you need, when you need it.  We recommend signing up for the patient portal called "MyChart".  Sign up information is provided on this After Visit Summary.  MyChart is used to connect with patients for Virtual Visits (Telemedicine).  Patients are able to view lab/test results, encounter notes, upcoming appointments, etc.  Non-urgent  messages can be sent to your provider as well.   To learn more about what you can do with MyChart, go to [email or web address].    Your next appointment:   6 week(s)  Provider:   [name], MD

## 2023-08-22 NOTE — Progress Notes (Signed)
Cardiology Office Note:    Date:  08/22/2023   ID:  Skip Estimable, DOB March 27, 1941, MRN [id number]  PCP:  Kristian Covey, MD   [facility] Providers Cardiologist:  [name], MD Electrophysiologist:  Sherryl Manges, MD     Referring MD: Kristian Covey, MD   Chief Complaint  Patient presents with   Atrial Fibrillation    History of Present Illness:    Jesus [name] is a 82 y.o. male with an extensive cardiovascular history who was recently hospitalized with acute on chronic systolic heart failure and atrial fibrillation with RVR, presenting today for hospital follow-up.  He has a history of coronary artery disease status post remote CABG, persistent atrial fibrillation anticoagulated with warfarin, severe aortic stenosis status post TAVR, chronic systolic heart failure, hypertension, and mixed hyperlipidemia. The patient has a history of remote anterior MI in 1980. He underwent multivessel CABG in 1997. He has had an LVEF around 40 to 45% with inferior wall scar. The patient had a non-ST elevation infarct in 2023 and was found to have severe stenosis in the saphenous vein graft to PDA, treated with a drug-eluting stent. He experienced nonsustained VT and was treated with IV amiodarone and beta-blockade. He was readmitted with acute on chronic systolic heart failure and medications were adjusted.  During his recent admission, his LVEF was found to be less than 20%.  He was managed medically with improvement in congestive symptoms.  He underwent palliative care evaluation and is now a DNR, but hopes to return to a good level of functionality and return to work.  The patient is here with his sister-in-law and his caregiver today.  Remarkably, he is feeling much better since hospital discharge.  States that his energy level is getting better.  Denies any recurrence of shortness of breath, orthopnea, or leg swelling.  He has no chest pain or pressure.  He is now ambulating  with a walker but only uses it intermittently when he is inside of his house.  Patient denies lightheadedness or syncope.  He is compliant with his medications.  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 S/P Ant MI 1980;  b. 1997 S/P CABG x 8 (LIMA to diag-LAD, SVG to OM1-OM2-OM3, SVG to [facility] - Dr Andrey Campanile);  c. 01/2015 Cath: 3VD, 8/8 patent grafts.   Cardiomyopathy    Dysrhythmia    Esophageal reflux    GERD (gastroesophageal reflux disease)    Headache    History of colonoscopy    History of transesophageal echocardiography (TEE) for monitoring    Other and unspecified hyperlipidemia    Paroxysmal atrial fibrillation (HCC) 02/20/2022   S/P angioplasty with stent 02/17/22 DES to proximal VG to PDA 02/20/2022   S/P TAVR (transcatheter aortic valve replacement)    a. 03/2015 26 mm Edwards Sapien 3 transcatheter heart valve placed via open left transfemoral approach.   Severe aortic stenosis 08/10/2012   Skin lesions, generalized    facial which may represent actinic keratoses and possible photosensitivity from Amiodarone   Unspecified essential hypertension     Past Surgical History:  Procedure Laterality Date   CARDIAC CATHETERIZATION     CARDIOVERSION  11/18/2006   Dr. Jacklynn Bue  CATARACT EXTRACTION W/ INTRAOCULAR LENS IMPLANT  April '13  (Dr. Dagoberto Ligas)   left eye only   CHOLECYSTECTOMY     COLONOSCOPY WITH PROPOFOL N/A 07/11/2023   Procedure: COLONOSCOPY WITH PROPOFOL;  Surgeon: [name], MD;  Location: [facility] ENDOSCOPY;  Service: Gastroenterology;  Laterality: N/A;   CORONARY ARTERY BYPASS GRAFT  02/09/1996   LIMA to diag-LAD, SVG to OM1-OM2-OM3, SVG to [facility]   CORONARY STENT INTERVENTION N/A 02/17/2022   Procedure: CORONARY STENT  INTERVENTION;  Surgeon: Kathleene Hazel, MD;  Location: [facility] INVASIVE CV LAB;  Service: Cardiovascular;  Laterality: N/A;   CORONARY/GRAFT ANGIOGRAPHY N/A 02/17/2022   Procedure: CORONARY/GRAFT ANGIOGRAPHY;  Surgeon: Kathleene Hazel, MD;  Location: [facility] INVASIVE CV LAB;  Service: Cardiovascular;  Laterality: N/A;   EYE SURGERY     HEMOSTASIS CLIP PLACEMENT  07/11/2023   Procedure: HEMOSTASIS CLIP PLACEMENT;  Surgeon: [name], MD;  Location: [facility] ENDOSCOPY;  Service: Gastroenterology;;   HEMOSTASIS CONTROL  07/11/2023   Procedure: HEMOSTASIS CONTROL;  Surgeon: [name], MD;  Location: [facility] ENDOSCOPY;  Service: Gastroenterology;;   LEFT AND RIGHT HEART CATHETERIZATION WITH CORONARY/GRAFT ANGIOGRAM N/A 01/26/2015   Procedure: LEFT AND RIGHT HEART CATHETERIZATION WITH Isabel Caprice;  Surgeon: Micheline Chapman, MD;  Location: [facility] CATH LAB;  Service: Cardiovascular;  Laterality: N/A;   POLYPECTOMY  07/11/2023   Procedure: POLYPECTOMY;  Surgeon: [name], MD;  Location: [facility] ENDOSCOPY;  Service: Gastroenterology;;   TEE WITHOUT CARDIOVERSION N/A 03/10/2015   Procedure: TRANSESOPHAGEAL ECHOCARDIOGRAM (TEE);  Surgeon: [name], MD;  Location: [facility] OR;  Service: Open Heart Surgery;  Laterality: N/A;   TONSILLECTOMY     TRANSCATHETER AORTIC VALVE REPLACEMENT, TRANSFEMORAL N/A 03/10/2015   Procedure: TRANSCATHETER AORTIC VALVE REPLACEMENT, TRANSFEMORAL;  Surgeon: [name], MD;  Location: [facility] OR;  Service: Open Heart Surgery;  Laterality: N/A;    Current Medications: Current Meds  Medication Sig   acetaminophen (TYLENOL) 325 MG tablet Take 1-2 tablets (325-650 mg total) by mouth every 4 (four) hours as needed for mild pain.   amiodarone (PACERONE) 200 MG tablet Take 1 tablet (200 mg total) by mouth daily.   amoxicillin (AMOXIL) 500 MG capsule Take 2,000 mg by mouth See admin instructions. Take 2,000 mg by mouth one hour prior to dental appointments   Ascorbic Acid  (VITAMIN C) 1000 MG tablet Take 1,000 mg by mouth daily.   aspirin EC 81 MG tablet Take 1 tablet (81 mg total) by mouth daily. Swallow whole.   atorvastatin (LIPITOR) 80 MG tablet Take 1 tablet (80 mg total) by mouth daily.   ezetimibe (ZETIA) 10 MG tablet Take 1 tablet (10 mg total) by mouth daily. (Patient taking differently: Take 10 mg by mouth at bedtime.)   furosemide (LASIX) 20 MG tablet Take 1 tablet (20 mg total) by mouth daily.   guaiFENesin-dextromethorphan (ROBITUSSIN DM) 100-10 MG/5ML syrup Take 10 mLs by mouth every 4 (four) hours as needed for cough.   JARDIANCE 10 MG TABS tablet TAKE 1 TABLET BY MOUTH EVERY DAY   lisinopril (ZESTRIL) 2.5 MG tablet Take 1 tablet (2.5 mg total) by mouth daily.   metoprolol succinate (TOPROL-XL) 25 MG 24 hr tablet Take 1 tablet (25 mg total) by mouth at bedtime.   Multiple Vitamins-Minerals (MULTIVITAMIN,TX-MINERALS) tablet Take 1 tablet by mouth daily with breakfast.   nitroGLYCERIN (NITROSTAT) 0.4 MG SL tablet Place 1 tablet (0.4 mg total) under the tongue every 5 (five) minutes x 3 doses as needed for chest pain.  pantoprazole (PROTONIX) 40 MG tablet Take 1 tablet (40 mg total) by mouth daily. (Patient taking differently: Take 40 mg by mouth daily before breakfast.)   warfarin (COUMADIN) 4 MG tablet TAKE 1 TABLET BY MOUTH DAILY EXCEPT TAKE 1/2 TABLET ON MONDAYS AND FRIDAYS OR AS DIRECTED BY ANTICOAGULATION CLINIC (Patient taking differently: Take 2-4 mg by mouth See admin instructions. Take 4 mg by mouth in the morning on Sun/Tues/Thurs/Sat and 2 mg on Mon/Wed/Fri)   [DISCONTINUED] valsartan (DIOVAN) 80 MG tablet Take 80 mg by mouth daily.     Allergies:   Iodine, Amlodipine, and Codeine   Social History   Socioeconomic History   Marital status: Married    Spouse name: Not on file   Number of children: Not on file   Years of education: Not on file   Highest education level: Not on file  Occupational History   Not on file  Tobacco Use    Smoking status: Never   Smokeless tobacco: Never   Tobacco comments:    Does not smoke.  Vaping Use   Vaping status: Never Used  Substance and Sexual Activity   Alcohol use: No    Alcohol/week: 0.0 standard drinks of alcohol   Drug use: No   Sexual activity: Not Currently  Other Topics Concern   Not on file  Social History Narrative   HSG. [employer] - [employer] 6 years. Married - '69 - 1 year/divorced. '76 - . No children. Work - mfg/textiles - Audiological scientist; currently works doing maintenance. [employer] - they have discussed this. Provided packet august '13.                Social Determinants of Health   Financial Resource Strain: Low Risk  (08/03/2023)   Overall Financial Resource Strain (CARDIA)    Difficulty of Paying Living Expenses: Not hard at all  Food Insecurity: No Food Insecurity (08/11/2023)   Hunger Vital Sign    Worried About Running Out of Food in the Last Year: Never true    Ran Out of Food in the Last Year: Never true  Transportation Needs: No Transportation Needs (08/11/2023)   PRAPARE - Administrator, Civil Service (Medical): No    Lack of Transportation (Non-Medical): No  Physical Activity: Sufficiently Active (08/03/2023)   Exercise Vital Sign    Days of Exercise per Week: 5 days    Minutes of Exercise per Session: 60 min  Stress: No Stress Concern Present (08/03/2023)   [employer] of Occupational Health - Occupational Stress Questionnaire    Feeling of Stress : Not at all  Social Connections: Socially Integrated (08/03/2023)   Social Connection and Isolation Panel [NHANES]    Frequency of Communication with Friends and Family: More than three times a week    Frequency of Social Gatherings with Friends and Family: More than three times a week    Attends Religious Services: More than 4 times per year    Active Member of [employer] or Organizations: Yes    Attends Engineer, structural: More than 4 times per year    Marital Status: Married      Family History: The patient's family history includes Atrial fibrillation in his brother; Crohn's disease in his mother; Heart attack (age of onset: 42) in his father; Heart disease in his father and paternal uncle; Heart failure in his father; Prostate cancer in his paternal uncle; Stroke (age of onset: 57) in his mother. There is no history of Colon cancer.  ROS:   Please see the history of present illness.    All other systems reviewed and are negative.  EKGs/Labs/Other Studies Reviewed:    The following studies were reviewed today: Echo: 1. Left ventricular ejection fraction, by estimation, is <20%. The left  ventricle has severely decreased function. The left ventricle demonstrates  global hypokinesis. The left ventricular internal cavity size was mildly  dilated. Left ventricular  diastolic parameters are indeterminate.   2. Right ventricular systolic function is moderately reduced. The right  ventricular size is normal. There is mildly elevated pulmonary artery  systolic pressure. The estimated right ventricular systolic pressure is  35.0 mmHg.   3. Left atrial size was severely dilated.   4. Right atrial size was mildly dilated.   5. The mitral valve is normal in structure. Mild mitral valve  regurgitation.   6. The aortic valve has been repaired/replaced. Aortic valve  regurgitation is mild to moderate. There is a 26 mm Edwards Sapien  prosthetic (TAVR) valve present in the aortic position. Procedure Date:  4/16. Echo findings are consistent with perivalvular  leak of the aortic prosthesis. Vmax 2.4, MG , EOA 1.2 cm^2, DI 0.40   7. The inferior vena cava is normal in size with greater than 50%  respiratory variability, suggesting right atrial pressure of 3 mmHg.       Recent Labs: 06/05/2023: TSH 5.030 08/11/2023: ALT 46; B Natriuretic Peptide 3,549.0 08/14/2023: BUN 42; Creatinine, Ser 1.76; Hemoglobin 12.8; Magnesium 2.2; Platelets 184; Potassium 4.2; Sodium 138   Recent Lipid Panel    Component Value Date/Time   CHOL 135 06/05/2023 0928   TRIG 90 06/05/2023 0928   TRIG 67 11/20/2006 0748   HDL 50 06/05/2023 0928   CHOLHDL 2.7 06/05/2023 0928   CHOLHDL 3 09/01/2022 0726   VLDL 16.6 09/01/2022 0726   LDLCALC 68 06/05/2023 0928     Risk Assessment/Calculations:    CHA2DS2-VASc Score = 5   This indicates a 7.2% annual risk of stroke. The patient's score is based upon: CHF History: 1 HTN History: 1 Diabetes History: 0 Stroke History: 0 Vascular Disease History: 1 Age Score: 2 Gender Score: 0               Physical Exam:    VS:  BP 120/60   Pulse (!) 55   Ht 5\' 1"  (1.549 m)   Wt 118 lb 3.2 oz (53.6 kg)   SpO2 97%   BMI 22.33 kg/m     Wt Readings from Last 3 Encounters:  08/22/23 118 lb 3.2 oz (53.6 kg)  08/14/23 117 lb 11.6 oz (53.4 kg)  08/03/23 120 lb (54.4 kg)     GEN:  Well nourished, well developed elderly male in no acute distress HEENT: Normal NECK: No JVD; No carotid bruits LYMPHATICS: No lymphadenopathy CARDIAC: RRR, 2/6 systolic ejection murmur at the right upper sternal border and 2/6 diastolic decrescendo murmur at the left lower sternal border RESPIRATORY:  Clear to auscultation without rales, wheezing or rhonchi  ABDOMEN: Soft, non-tender, non-distended MUSCULOSKELETAL:  No edema; No deformity  SKIN: Warm and dry NEUROLOGIC:  Alert and oriented x 3 PSYCHIATRIC:  Normal affect   ASSESSMENT:    1. Medication management   2. Chronic HFrEF (heart failure with reduced ejection fraction) (HCC)   3. Warfarin anticoagulation   4. Chronic systolic CHF (congestive heart failure) (HCC)   5. Persistent atrial fibrillation (HCC)    PLAN:    In order of problems listed above:  Continue current medical therapy.  No changes are made today. Recent hospital records reviewed.  The patient's LVEF is severely decreased now to less than 20%.  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  I recommended a repeat echocardiogram in 6 weeks now that he is back in sinus rhythm and clinically improved.  He will continue on his medical regimen which includes Jardiance, low-dose lisinopril, and metoprolol succinate. Check INR today As above The patient appears to be back in sinus rhythm.  On exam his heart is regular and he is bradycardic with a heart rate of 55 bpm.  I am hopeful that with this will help improve his LV function.  He will continue on amiodarone for rhythm control and anticoagulation with warfarin.  We had a lengthy discussion about driving as well as his return to work.  He is very motivated to get back to his job which involves light work just a few hours per day.  However, he has to drive to work and he was told during his hospitalization that he should not be driving until seen in follow-up.  After discussion about his functional status and cardiac problems, we will wait to see him back in 6 weeks with a repeat echocardiogram before we clear him to go back to driving and back to work.  In the meantime he will stay on his current medicines and continue with therapy to try to get stronger.     Medication Adjustments/Labs and Tests Ordered: Current medicines are reviewed at length with the patient today.  Concerns regarding medicines are outlined above.  Orders Placed This Encounter  Procedures   Protime-INR   Basic metabolic panel   No orders of the defined types were placed in this encounter.   Patient Instructions  Medication Instructions:  Your physician recommends that you continue on your current medications as directed. Please refer to the Current Medication list given to you today.  *If you need a refill on your cardiac medications before your next appointment, please call your pharmacy*  Lab Work: BMET, pro-BNP, INR today If you have labs (blood work) drawn today and your tests are completely normal, you will receive  your results only by: MyChart Message (if you have MyChart) OR A paper copy in the mail If you have any lab test that is abnormal or we need to change your treatment, we will call you to review the results.  Testing/Procedures: ECHO in 6 weeks Your physician has requested that you have an echocardiogram. Echocardiography is a painless test that uses sound waves to create images of your heart. It provides your doctor with information about the size and shape of your heart and how well your heart's chambers and valves are working. This procedure takes approximately one hour. There are no restrictions for this procedure. Please do NOT wear cologne, perfume, aftershave, or lotions (deodorant is allowed). Please arrive 15 minutes prior to your appointment time.  Follow-Up: At [facility], you and your health needs are our priority.  As part of our continuing mission to provide you with exceptional heart care, we have created designated Provider Care Teams.  These Care Teams include your primary Cardiologist (physician) and Advanced Practice Providers (APPs -  Physician Assistants and Nurse Practitioners) who all work together to provide you with the care you need, when you need it.  We recommend signing up for the patient portal called "MyChart".  Sign up information  is provided on this After Visit Summary.  MyChart is used to connect with patients for Virtual Visits (Telemedicine).  Patients are able to view lab/test results, encounter notes, upcoming appointments, etc.  Non-urgent messages can be sent to your provider as well.   To learn more about what you can do with MyChart, go to [email or web address].    Your next appointment:   6 week(s)  Provider:   [name], MD        Signed, [name], MD  08/22/2023 1:22 PM    [facility]

## 2023-08-23 ENCOUNTER — Ambulatory Visit (INDEPENDENT_AMBULATORY_CARE_PROVIDER_SITE_OTHER): Payer: Medicare Other

## 2023-08-23 DIAGNOSIS — Z7901 Long term (current) use of anticoagulants: Secondary | ICD-10-CM | POA: Diagnosis not present

## 2023-08-23 NOTE — Progress Notes (Signed)
Pt recently admitted for acute and chronic CHF.  Pt had apt with cardiology yesterday where lab INR was drawn. Result received today.  Continue 1 tablet daily except take 1/2 tablet on Mondays, Wednesdays and Fridays. Recheck in 4 weeks.  Contacted Faye, sister-in-law, and advised to continue normal dosing. She will relay information to pt and the person sitting with him. Jesus [name] verbalized understanding.

## 2023-08-23 NOTE — Patient Instructions (Addendum)
Pre visit review using our clinic review tool, if applicable. No additional management support is needed unless otherwise documented below in the visit note.  Continue 1 tablet daily except take 1/2 tablet on Mondays, Wednesdays and Fridays. Recheck in 4 weeks.

## 2023-08-24 DIAGNOSIS — K922 Gastrointestinal hemorrhage, unspecified: Secondary | ICD-10-CM | POA: Diagnosis not present

## 2023-08-24 DIAGNOSIS — I110 Hypertensive heart disease with heart failure: Secondary | ICD-10-CM | POA: Diagnosis not present

## 2023-08-24 DIAGNOSIS — I480 Paroxysmal atrial fibrillation: Secondary | ICD-10-CM | POA: Diagnosis not present

## 2023-08-24 DIAGNOSIS — I5022 Chronic systolic (congestive) heart failure: Secondary | ICD-10-CM | POA: Diagnosis not present

## 2023-08-24 DIAGNOSIS — D649 Anemia, unspecified: Secondary | ICD-10-CM | POA: Diagnosis not present

## 2023-08-28 ENCOUNTER — Telehealth: Payer: Self-pay | Admitting: Family Medicine

## 2023-08-28 ENCOUNTER — Ambulatory Visit (HOSPITAL_COMMUNITY): Payer: Medicare Other

## 2023-08-28 NOTE — Telephone Encounter (Signed)
Erin informed of the message below and voiced understanding.

## 2023-08-28 NOTE — Telephone Encounter (Signed)
Erin caregiver is calling and she is aware md said pt has heart failure and would like to know if its ok for pt to get flu shot

## 2023-09-01 DIAGNOSIS — I110 Hypertensive heart disease with heart failure: Secondary | ICD-10-CM | POA: Diagnosis not present

## 2023-09-01 DIAGNOSIS — K922 Gastrointestinal hemorrhage, unspecified: Secondary | ICD-10-CM | POA: Diagnosis not present

## 2023-09-01 DIAGNOSIS — D649 Anemia, unspecified: Secondary | ICD-10-CM | POA: Diagnosis not present

## 2023-09-01 DIAGNOSIS — I480 Paroxysmal atrial fibrillation: Secondary | ICD-10-CM | POA: Diagnosis not present

## 2023-09-01 DIAGNOSIS — I5022 Chronic systolic (congestive) heart failure: Secondary | ICD-10-CM | POA: Diagnosis not present

## 2023-09-07 ENCOUNTER — Telehealth: Payer: Self-pay | Admitting: Family Medicine

## 2023-09-07 DIAGNOSIS — I480 Paroxysmal atrial fibrillation: Secondary | ICD-10-CM | POA: Diagnosis not present

## 2023-09-07 DIAGNOSIS — I110 Hypertensive heart disease with heart failure: Secondary | ICD-10-CM | POA: Diagnosis not present

## 2023-09-07 DIAGNOSIS — I5022 Chronic systolic (congestive) heart failure: Secondary | ICD-10-CM | POA: Diagnosis not present

## 2023-09-07 DIAGNOSIS — D649 Anemia, unspecified: Secondary | ICD-10-CM | POA: Diagnosis not present

## 2023-09-07 DIAGNOSIS — K922 Gastrointestinal hemorrhage, unspecified: Secondary | ICD-10-CM | POA: Diagnosis not present

## 2023-09-07 NOTE — Telephone Encounter (Addendum)
Jomarie Longs, PT's assistant with [facility] [phone]  Called to inform MD of Vitals Heart Rate at sitting: in the 50s This a.m: 54  This is usual/normal for Pt, most days Pt is Asymptomatic No dizziness, etc  MD is aware, as they have reported this previously  Jomarie Longs states this is the second or third notification of Pt's HR. They are wondering if they can have the order to report Pt's HR vacated,  as PT & PTA believe this may just be Pt's baseline?  Please call: [name] [phone]  Jomarie Longs is a PTA and cannot take verbal orders.

## 2023-09-11 NOTE — Telephone Encounter (Signed)
Left a detailed message on Jesus [name]'s voicemail with the advice as below.

## 2023-09-12 DIAGNOSIS — I480 Paroxysmal atrial fibrillation: Secondary | ICD-10-CM | POA: Diagnosis not present

## 2023-09-12 DIAGNOSIS — D649 Anemia, unspecified: Secondary | ICD-10-CM | POA: Diagnosis not present

## 2023-09-12 DIAGNOSIS — I110 Hypertensive heart disease with heart failure: Secondary | ICD-10-CM | POA: Diagnosis not present

## 2023-09-12 DIAGNOSIS — I5022 Chronic systolic (congestive) heart failure: Secondary | ICD-10-CM | POA: Diagnosis not present

## 2023-09-12 DIAGNOSIS — K922 Gastrointestinal hemorrhage, unspecified: Secondary | ICD-10-CM | POA: Diagnosis not present

## 2023-09-13 ENCOUNTER — Telehealth: Payer: Self-pay | Admitting: Cardiovascular Disease

## 2023-09-13 MED ORDER — LISINOPRIL 2.5 MG PO TABS: 2.5000 mg | ORAL_TABLET | Freq: Every day | ORAL | 3 refills | Status: DC

## 2023-09-13 NOTE — Telephone Encounter (Signed)
Pt's medication was sent to pt's pharmacy as requested. Confirmation received.  °

## 2023-09-13 NOTE — Telephone Encounter (Signed)
*  STAT* If patient is at the pharmacy, call can be transferred to refill team.   1. Which medications need to be refilled? (please list name of each medication and dose if known)   lisinopril (ZESTRIL) 2.5 MG tablet    2. Which pharmacy/location (including street and city if local pharmacy) is medication to be sent to? [facility] - [place], [place] - [address]   3. Do they need a 30 day or 90 day supply? 90

## 2023-09-19 ENCOUNTER — Ambulatory Visit (INDEPENDENT_AMBULATORY_CARE_PROVIDER_SITE_OTHER): Payer: Medicare Other

## 2023-09-19 DIAGNOSIS — Z7901 Long term (current) use of anticoagulants: Secondary | ICD-10-CM | POA: Diagnosis not present

## 2023-09-19 DIAGNOSIS — Z23 Encounter for immunization: Secondary | ICD-10-CM

## 2023-09-19 LAB — POCT INR: INR: 1.7 — AB (ref 2.0–3.0)

## 2023-09-19 NOTE — Patient Instructions (Addendum)
Pre visit review using our clinic review tool, if applicable. No additional management support is needed unless otherwise documented below in the visit note.  Increase dose today to take 1 1/2 tablets and then continue 1 tablet daily except take 1/2 tablet on Mondays, Wednesdays and Fridays. Recheck in 2 weeks.

## 2023-09-19 NOTE — Progress Notes (Signed)
Metoprolol and lisinopril added by cardiology. No interaction with warfarin. Increase dose today to take 1 1/2 tablets and then continue 1 tablet daily except take 1/2 tablet on Mondays, Wednesdays and Fridays. Recheck in 2 weeks.   Pt requested high dose flu vaccine. Administered vaccine. Pt tolerated well.

## 2023-10-02 ENCOUNTER — Telehealth: Payer: Self-pay | Admitting: Cardiovascular Disease

## 2023-10-02 NOTE — Telephone Encounter (Signed)
Patient states he was returning call. Please advise ?

## 2023-10-03 ENCOUNTER — Ambulatory Visit (INDEPENDENT_AMBULATORY_CARE_PROVIDER_SITE_OTHER): Payer: Medicare Other

## 2023-10-03 VITALS — BP 104/52 | HR 60

## 2023-10-03 DIAGNOSIS — Z7901 Long term (current) use of anticoagulants: Secondary | ICD-10-CM

## 2023-10-03 LAB — POCT INR: INR: 2 (ref 2.0–3.0)

## 2023-10-03 NOTE — Patient Instructions (Addendum)
Pre visit review using our clinic review tool, if applicable. No additional management support is needed unless otherwise documented below in the visit note.  Continue 1 tablet daily except take 1/2 tablet on Mondays, Wednesdays and Fridays. Recheck in 5 weeks.

## 2023-10-03 NOTE — Progress Notes (Addendum)
Continue 1 tablet daily except take 1/2 tablet on Mondays, Wednesdays and Fridays. Recheck in 5 weeks.   Pt requested BP check. BP to is 104/52, HR 60, and O2sat 95%. These are normal readings for pt.

## 2023-10-04 ENCOUNTER — Other Ambulatory Visit: Payer: Medicare Other

## 2023-10-04 NOTE — Telephone Encounter (Signed)
Reached out to patient to confirm that I hadn't called him and see if there was something I could help him with. Verified his upcoming appt date and time. No further questions.

## 2023-10-10 ENCOUNTER — Encounter: Payer: Self-pay | Admitting: Cardiovascular Disease

## 2023-10-10 ENCOUNTER — Ambulatory Visit: Payer: Medicare Other | Attending: Cardiovascular Disease | Admitting: Cardiovascular Disease

## 2023-10-10 ENCOUNTER — Other Ambulatory Visit: Payer: Self-pay | Admitting: Internal Medicine

## 2023-10-10 ENCOUNTER — Ambulatory Visit (HOSPITAL_BASED_OUTPATIENT_CLINIC_OR_DEPARTMENT_OTHER): Payer: Medicare Other

## 2023-10-10 ENCOUNTER — Ambulatory Visit: Payer: Medicare Other

## 2023-10-10 VITALS — BP 128/60 | HR 52 | Ht 61.0 in | Wt 117.8 lb

## 2023-10-10 DIAGNOSIS — E785 Hyperlipidemia, unspecified: Secondary | ICD-10-CM | POA: Diagnosis not present

## 2023-10-10 DIAGNOSIS — I5022 Chronic systolic (congestive) heart failure: Secondary | ICD-10-CM | POA: Insufficient documentation

## 2023-10-10 DIAGNOSIS — Z79899 Other long term (current) drug therapy: Secondary | ICD-10-CM | POA: Diagnosis not present

## 2023-10-10 DIAGNOSIS — Z952 Presence of prosthetic heart valve: Secondary | ICD-10-CM

## 2023-10-10 DIAGNOSIS — I4819 Other persistent atrial fibrillation: Secondary | ICD-10-CM

## 2023-10-10 DIAGNOSIS — I2510 Atherosclerotic heart disease of native coronary artery without angina pectoris: Secondary | ICD-10-CM | POA: Diagnosis not present

## 2023-10-10 DIAGNOSIS — I10 Essential (primary) hypertension: Secondary | ICD-10-CM | POA: Diagnosis not present

## 2023-10-10 LAB — ECHOCARDIOGRAM COMPLETE
AR max vel: 1.34 cm2
AV Area VTI: 1.31 cm2
AV Area mean vel: 1.28 cm2
AV Mean grad: 10 mm[Hg]
AV Peak grad: 18.6 mm[Hg]
Ao pk vel: 2.16 m/s
Area-P 1/2: 3.08 cm2
Est EF: 30
P 1/2 time: 495 ms
S' Lateral: 4.63 cm

## 2023-10-10 MED ORDER — FUROSEMIDE 20 MG PO TABS: ORAL_TABLET | ORAL | 3 refills | Status: DC

## 2023-10-10 NOTE — Patient Instructions (Signed)
Medication Instructions:  CHANGE Furosemide to as needed only *If you need a refill on your cardiac medications before your next appointment, please call your pharmacy*  Follow-Up: At [facility], you and your health needs are our priority.  As part of our continuing mission to provide you with exceptional heart care, we have created designated Provider Care Teams.  These Care Teams include your primary Cardiologist (physician) and Advanced Practice Providers (APPs -  Physician Assistants and Nurse Practitioners) who all work together to provide you with the care you need, when you need it.  We recommend signing up for the patient portal called "MyChart".  Sign up information is provided on this After Visit Summary.  MyChart is used to connect with patients for Virtual Visits (Telemedicine).  Patients are able to view lab/test results, encounter notes, upcoming appointments, etc.  Non-urgent messages can be sent to your provider as well.   To learn more about what you can do with MyChart, go to [email or web address].    Your next appointment:   3 month(s)  Provider:   Tereso Newcomer, PA

## 2023-10-10 NOTE — Progress Notes (Signed)
Cardiology Office Note:    Date:  10/10/2023   ID:  Jesus [name], DOB 1941-10-11, MRN [id number]  PCP:  Kristian Covey, MD   [facility] Providers Cardiologist:  [name], MD Electrophysiologist:  Sherryl Manges, MD     Referring MD: Kristian Covey, MD   Chief Complaint  Patient presents with   Atrial Fibrillation    History of Present Illness:    Jesus [name] is a 82 y.o. male presenting for follow-up evaluation.  The patient has an extensive cardiovascular history as outlined in multiple encounters.  I just saw him August 22, 2023 for hospital follow-up after he was admitted with acute on chronic systolic heart failure and atrial fibrillation with RVR. He has a history of coronary artery disease status post remote CABG, persistent atrial fibrillation anticoagulated with warfarin, severe aortic stenosis status post TAVR, chronic systolic heart failure, hypertension, and mixed hyperlipidemia. The patient has a history of remote anterior MI in 1980. He underwent multivessel CABG in 1997. He has had an LVEF around 40 to 45% with inferior wall scar. The patient had a non-ST elevation infarct in 2023 and was found to have severe stenosis in the saphenous vein graft to PDA, treated with a drug-eluting stent. He experienced nonsustained VT and was treated with IV amiodarone and beta-blockade. He was readmitted with acute on chronic systolic heart failure and medications were adjusted.  During his recent admission, his LVEF was found to be less than 20%.  He was managed medically with improvement in congestive symptoms.  He underwent palliative care evaluation and is now a DNR, but hopes to return to a good level of functionality and return to work.   The patient is here with his caregiver and sister-in-law today.  He has been doing pretty well.  He is eating better and his activity level has improved.  He denies symptoms of chest pain or shortness of breath at present.   He has had no recent heart palpitations, lightheadedness, or syncope.  They note that he is having some issues with his memory.  He also has an unsteady gait and he is not really using his cane.  He has been doing some local driving but has not been out on the highways.  He remains eager to go back to work and wants to be able to drive.  Current Medications: Current Meds  Medication Sig   acetaminophen (TYLENOL) 325 MG tablet Take 1-2 tablets (325-650 mg total) by mouth every 4 (four) hours as needed for mild pain.   amiodarone (PACERONE) 200 MG tablet Take 1 tablet (200 mg total) by mouth daily.   amoxicillin (AMOXIL) 500 MG capsule Take 2,000 mg by mouth See admin instructions. Take 2,000 mg by mouth one hour prior to dental appointments   Ascorbic Acid (VITAMIN C) 1000 MG tablet Take 1,000 mg by mouth daily.   aspirin EC 81 MG tablet Take 1 tablet (81 mg total) by mouth daily. Swallow whole.   atorvastatin (LIPITOR) 80 MG tablet Take 1 tablet (80 mg total) by mouth daily.   ezetimibe (ZETIA) 10 MG tablet Take 1 tablet (10 mg total) by mouth daily. (Patient taking differently: Take 10 mg by mouth at bedtime.)   furosemide (LASIX) 20 MG tablet Take 1 tablet by mouth only as needed   guaiFENesin-dextromethorphan (ROBITUSSIN DM) 100-10 MG/5ML syrup Take 10 mLs by mouth every 4 (four) hours as needed for cough.   JARDIANCE 10 MG TABS tablet TAKE 1  TABLET BY MOUTH EVERY DAY   lisinopril (ZESTRIL) 2.5 MG tablet Take 1 tablet (2.5 mg total) by mouth daily.   metoprolol succinate (TOPROL-XL) 25 MG 24 hr tablet Take 1 tablet (25 mg total) by mouth at bedtime.   Multiple Vitamins-Minerals (MULTIVITAMIN,TX-MINERALS) tablet Take 1 tablet by mouth daily with breakfast.   nitroGLYCERIN (NITROSTAT) 0.4 MG SL tablet Place 1 tablet (0.4 mg total) under the tongue every 5 (five) minutes x 3 doses as needed for chest pain.   pantoprazole (PROTONIX) 40 MG tablet Take 1 tablet (40 mg total) by mouth daily. (Patient  taking differently: Take 40 mg by mouth daily before breakfast.)   warfarin (COUMADIN) 4 MG tablet TAKE 1 TABLET BY MOUTH DAILY EXCEPT TAKE 1/2 TABLET ON MONDAYS AND FRIDAYS OR AS DIRECTED BY ANTICOAGULATION CLINIC (Patient taking differently: Take 2-4 mg by mouth See admin instructions. Take 4 mg by mouth in the morning on Sun/Tues/Thurs/Sat and 2 mg on Mon/Wed/Fri)   [DISCONTINUED] furosemide (LASIX) 20 MG tablet Take 1 tablet (20 mg total) by mouth daily.     Allergies:   Iodine, Amlodipine, and Codeine   ROS:   Please see the history of present illness.    All other systems reviewed and are negative.  EKGs/Labs/Other Studies Reviewed:    The following studies were reviewed today: Cardiac Studies & Procedures   CARDIAC CATHETERIZATION  CARDIAC CATHETERIZATION 02/17/2022  Narrative   Ost RCA to Prox RCA lesion is 100% stenosed.   Prox Graft lesion before RV Branch  is 80% stenosed.   RPAV lesion is 100% stenosed.   Ost LM to Mid LM lesion is 100% stenosed.   3rd Mrg lesion is 80% stenosed.   A drug-eluting stent was successfully placed using a STENT ONYX FRONTIER 3.5X12.   Post intervention, there is a 10% residual stenosis.   SVG graft was visualized by angiography.   SVG graft was visualized by angiography.   LIMA graft was visualized by angiography.  Severe native vessel CAD s/p 6V CABG with 6/6 patent grafts. 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 There is a severe stenosis in the proximal body of the SVG to the PDA. Successful PTCA/DES x 1 proximal body of SVG to PDA using a distal protection device.  Recommendations: Continue ASA/Plavix until he is therapeutic on coumadin and can then consider stopping ASA while  continuing Plavix with coumadin.  Findings Coronary Findings Diagnostic  Dominance: Right  Left Main Ost LM to Mid LM lesion is 100% stenosed. The lesion is chronically occluded.  Left Anterior Descending  Second Septal Branch Collaterals 2nd Sept filled by collaterals from 2nd RPL.  Left Circumflex  Third Obtuse Marginal Branch 3rd Mrg lesion is 80% stenosed.  Right Coronary Artery Vessel is large. Ost RCA to Prox RCA lesion is 100% stenosed. The lesion is chronically occluded.  Right Posterior Atrioventricular Artery RPAV lesion is 100% stenosed. The lesion is chronically occluded.  Sequential Saphenous Graft To RV Branch, Dist RCA SVG graft was visualized by angiography. Prox Graft lesion before RV Branch  is 80% stenosed.  Sequential Saphenous Graft To 1st Mrg, 2nd Mrg, 3rd Mrg SVG graft was visualized by angiography.  LIMA LIMA Graft To Mid  LAD LIMA graft was visualized by angiography.  Intervention  Prox Graft lesion before RV Branch (Sequential Saphenous Graft To RV Branch, Dist RCA) Stent CATHETER LAUNCHER 6FR AL1 guide catheter was inserted. Lesion crossed with guidewire using a DEVICE SPIDERFX EMB PROT . Pre-stent angioplasty was not performed. A drug-eluting stent was successfully placed using a STENT ONYX FRONTIER 3.5X12. Stent strut is well apposed. Post-stent angioplasty was not performed. Post-Intervention Lesion Assessment The intervention was successful. Pre-interventional TIMI flow is 3. Post-intervention TIMI flow is 3. No complications occurred at this lesion. There is a 10% residual stenosis post intervention.     ECHOCARDIOGRAM  ECHOCARDIOGRAM COMPLETE 08/12/2023  Narrative ECHOCARDIOGRAM REPORT    Patient Name:   Dio JAPHET MORGENTHALER Date of Exam: 08/12/2023 Medical Rec #:  [id number]        Height:       61.0 in Accession #:    [id number]       Weight:       110.6 lb Date of Birth:  08/14/1941         BSA:          1.468 m Patient Age:     82 years         BP:           110/64 mmHg Patient Gender: M                HR:           81 bpm. Exam Location:  Inpatient  Procedure: 2D Echo, Color Doppler and Cardiac Doppler  Indications:    acute systolic chf  History:        Patient has prior history of Echocardiogram examinations, most recent 02/16/2022. Cardiomyopathy, Prior CABG, chronic kidney disease, Arrythmias:Atrial Fibrillation; Risk Factors:Hypertension and Dyslipidemia. Aortic Valve: 26 mm Edwards Sapien prosthetic, stented (TAVR) valve is present in the aortic position. Procedure Date: 4/16.  Sonographer:    Delcie Roch RDCS Referring Phys: [id number] KUBER GHIMIRE  IMPRESSIONS   1. Left ventricular ejection fraction, by estimation, is <20%. The left ventricle has severely decreased function. The left ventricle demonstrates global hypokinesis. The left ventricular internal cavity size was mildly dilated. Left ventricular diastolic parameters are indeterminate. 2. Right ventricular systolic function is moderately reduced. The right ventricular size is normal. There is mildly elevated pulmonary artery systolic pressure. The estimated right ventricular systolic pressure is 35.0 mmHg. 3. Left atrial size was severely dilated. 4. Right atrial size was mildly dilated. 5. The mitral valve is normal in structure. Mild mitral valve regurgitation. 6. The aortic valve has been repaired/replaced. Aortic valve regurgitation is mild to moderate. There is a 26 mm Edwards Sapien prosthetic (TAVR) valve present in the aortic position. Procedure Date: 4/16. Echo findings are consistent with perivalvular leak of the aortic prosthesis. Vmax 2.4, MG , EOA 1.2 cm^2, DI 0.40 7. The inferior vena cava is normal in size with greater than 50% respiratory variability, suggesting right atrial pressure of 3 mmHg.  FINDINGS Left Ventricle: Left ventricular ejection fraction, by estimation, is <20%. The left ventricle has severely  decreased function. The left ventricle demonstrates global hypokinesis. The left ventricular internal cavity size was mildly dilated. There is no left ventricular hypertrophy. Left ventricular diastolic parameters are indeterminate.  Right Ventricle: The right ventricular size is normal. No increase in right ventricular wall thickness. Right ventricular systolic function is moderately reduced. There is mildly elevated pulmonary artery systolic pressure. The tricuspid regurgitant velocity is 2.83 m/s, and with an  assumed right atrial pressure of 3 mmHg, the estimated right ventricular systolic pressure is 35.0 mmHg.  Left Atrium: Left atrial size was severely dilated.  Right Atrium: Right atrial size was mildly dilated.  Pericardium: There is no evidence of pericardial effusion.  Mitral Valve: The mitral valve is normal in structure. Mild mitral valve regurgitation.  Tricuspid Valve: The tricuspid valve is normal in structure. Tricuspid valve regurgitation is trivial.  Aortic Valve: The aortic valve has been repaired/replaced. Aortic valve regurgitation is mild to moderate. Aortic valve mean gradient measures 8.2 mmHg. Aortic valve peak gradient measures 17.3 mmHg. Aortic valve area, by VTI measures 1.08 cm. There is a 26 mm Edwards Sapien prosthetic, stented (TAVR) valve present in the aortic position. Procedure Date: 4/16.  Pulmonic Valve: The pulmonic valve was not well visualized. Pulmonic valve regurgitation is mild.  Aorta: The aortic root and ascending aorta are structurally normal, with no evidence of dilitation.  Venous: The inferior vena cava is normal in size with greater than 50% respiratory variability, suggesting right atrial pressure of 3 mmHg.  IAS/Shunts: The interatrial septum was not well visualized.   LEFT VENTRICLE PLAX 2D LVIDd:         5.80 cm LVIDs:         5.50 cm LV PW:         0.90 cm LV IVS:        1.10 cm LVOT diam:     1.80 cm LV SV:         39 LV SV  Index:   27 LVOT Area:     2.54 cm   RIGHT VENTRICLE          IVC RV Basal diam:  2.90 cm  IVC diam: 1.70 cm  LEFT ATRIUM             Index        RIGHT ATRIUM           Index LA diam:        4.50 cm 3.06 cm/m   RA Area:     16.80 cm LA Vol (A2C):   95.9 ml 65.31 ml/m  RA Volume:   44.50 ml  30.31 ml/m LA Vol (A4C):   66.3 ml 45.15 ml/m LA Biplane Vol: 82.8 ml 56.39 ml/m AORTIC VALVE AV Area (Vmax):    1.10 cm AV Area (Vmean):   1.12 cm AV Area (VTI):     1.08 cm AV Vmax:           208.00 cm/s AV Vmean:          130.500 cm/s AV VTI:            0.364 m AV Peak Grad:      17.3 mmHg AV Mean Grad:      8.2 mmHg LVOT Vmax:         89.80 cm/s LVOT Vmean:        57.550 cm/s LVOT VTI:          0.155 m LVOT/AV VTI ratio: 0.43  AORTA Ao Asc diam: 3.20 cm  TRICUSPID VALVE TR Peak grad:   32.0 mmHg TR Vmax:        283.00 cm/s  SHUNTS Systemic VTI:  0.16 m Systemic Diam: 1.80 cm  Epifanio Lesches MD Electronically signed by Epifanio Lesches MD Signature Date/Time: 08/12/2023/7:07:09 PM    Final    MONITORS  LONG TERM MONITOR (3-14 DAYS) 01/25/2022  Narrative Patch Wear Time:  2 days  and 17 hours (2023-02-09T21:17:52-0500 to 2023-02-12T14:54:06-499)  Patient had a min HR of 47 bpm, max HR of 119 bpm, and avg HR of 65 bpm. Predominant underlying rhythm was Sinus Rhythm. 1 run of Supraventricular Tachycardia occurred lasting 4 beats with a max rate of 119 bpm (avg 98 bpm). Isolated SVEs were occasional (4.2%, 10845), SVE Couplets were rare (<1.0%, 76), and SVE Triplets were rare (<1.0%, 10). Isolated VEs were occasional (2.2%, 5676), VE Couplets were rare (<1.0%, 677), and VE Triplets were rare (<1.0%, 22). Ventricular Bigeminy and Trigeminy were present.  SUMMARY: 1. The basic rhythm is normal sinus with an average HR of 65 bpm 2. No atrial fibrillation or flutter 3. No high-grade heart block or pathologic pauses 4. There are occasional PVC's and  occasional supraventricular beats without sustained arrhythmias  RECOMMEND: Reassurance, no serious arrhythmias identified, continue current Rx   CT SCANS  CT CORONARY MORPH W/CTA COR W/SCORE 02/13/2015  Addendum 02/16/2015  8:20 AM ADDENDUM REPORT: 02/16/2015 08:18  EXAM: OVER-READ INTERPRETATION  CT CHEST  The following report is an over-read performed by radiologist Dr. Maryelizabeth [facility] on 02/16/2015. This over-read does not include interpretation of cardiac or coronary anatomy or pathology. The cardiac CTA interpretation by the cardiologist is attached.  COMPARISON:  CT 02/13/2015  FINDINGS: Mild basilar atelectasis. Suspicious pulmonary nodules. Tiny subpleural nodule in the left lower lobe measures 3 mm (image 45 series 414) is likely benign. No axillary adenopathy aggressive osseous lesion.  IMPRESSION: Tiny subpleural nodule in left lower lobe is likely benign.   Electronically Signed By: Genevive Bi M.D. On: 02/16/2015 08:18  Narrative MEDICATIONS: None  CLINICAL DATA:  Aortic Stenosis  EXAM: Cardiac TAVR CT  TECHNIQUE: The patient was scanned on a Philips 256 scanner. A 120 kV retrospective scan was triggered in the descending thoracic aorta at 111 HU's. Gantry rotation speed was 270 msecs and collimation was .9 mm. No beta blockade or nitro were given. The 3D data set was reconstructed in 5% intervals of the R-R cycle. Systolic and diastolic phases were analyzed on a dedicated work station using MPR, MIP and VRT modes. The patient received 80 cc of contrast.  FINDINGS: Aortic Valve: Trileaflet heavily calcified. Mild calcification of the base of the left cusp at the annulus. No significant sino tubular junction calcification.  Aorta: No aneurysm or significant plaque. Normal origin of great vessels Mild calcification of the inferior surface of the arch  Sinotubular Junction:  23.8 mm  Ascending Thoracic Aorta:  30  mm  Aortic Arch:  25 mm  Descending Thoracic Aorta:  22 mm  Sinus of Valsalva Measurements:  Non-coronary:  31 mm  Right -coronary:  30 mm  Left -coronary:  31mm  Coronary Artery Height above Annulus:  Left Main:  13.7 mm  Right Coronary:  17 mm  Virtual Basal Annulus Measurements:  Maximum/Minimum Diameter:  22.2 mm x 29.9 mm  Area:  516 mm2  Coronary Arteries: Native vessels occluded. Patient LIMA to LAD/D1, Patent SVG to OM1, OM2 sequential Patent SVG to AM/PDA  Optimum Fluoroscopic Angle for Delivery: RAO 1 degree Cranial 0 degrees  IMPRESSION: 1) Calcified trileaflet aortic valve suitable for a 26 mm Sapien 3 valve Area 516 mm2  2) Suitable coronary height above annulus for delivery  3) Patient SVG to PDA, Sequential to OM's and LIMA to LAD  4) Normal ascending aortic root size with no contraindications in Arch for TAVR  5) Optimum delivery angle essentially straight AP  Charlton Haws  Electronically Signed: By: Charlton Haws M.D. On: 02/13/2015 12:22          EKG:        Recent Labs: 06/05/2023: TSH 5.030 08/11/2023: ALT 46; B Natriuretic Peptide 3,549.0 08/14/2023: Hemoglobin 12.8; Magnesium 2.2; Platelets 184 08/22/2023: BUN 33; Creatinine, Ser 1.49; NT-Pro BNP 10,205; Potassium 4.5; Sodium 141  Recent Lipid Panel    Component Value Date/Time   CHOL 135 06/05/2023 0928   TRIG 90 06/05/2023 0928   TRIG 67 11/20/2006 0748   HDL 50 06/05/2023 0928   CHOLHDL 2.7 06/05/2023 0928   CHOLHDL 3 09/01/2022 0726   VLDL 16.6 09/01/2022 0726   LDLCALC 68 06/05/2023 0928     Risk Assessment/Calculations:    CHA2DS2-VASc Score = 5   This indicates a 7.2% annual risk of stroke. The patient's score is based upon: CHF History: 1 HTN History: 1 Diabetes History: 0 Stroke History: 0 Vascular Disease History: 1 Age Score: 2 Gender Score: 0               Physical Exam:    VS:  BP 128/60   Pulse (!) 52   Ht 5\' 1"  (1.549 m)   Wt 117 lb 12.8  oz (53.4 kg)   SpO2 98%   BMI 22.26 kg/m     Wt Readings from Last 3 Encounters:  10/10/23 117 lb 12.8 oz (53.4 kg)  08/22/23 118 lb 3.2 oz (53.6 kg)  08/14/23 117 lb 11.6 oz (53.4 kg)     GEN:  Well nourished, well developed elderly male in no acute distress HEENT: Normal NECK: No JVD; No carotid bruits LYMPHATICS: No lymphadenopathy CARDIAC: RRR, soft ejection murmur at the right upper sternal border with a 2/6 diastolic decrescendo murmur unchanged from past exams RESPIRATORY:  Clear to auscultation without rales, wheezing or rhonchi  ABDOMEN: Soft, non-tender, non-distended MUSCULOSKELETAL:  No edema; No deformity  SKIN: Warm and dry NEUROLOGIC:  Alert and oriented x 3 PSYCHIATRIC:  Normal affect   Assessment & Plan Chronic HFrEF (heart failure with reduced ejection fraction) (HCC) The patient appears clinically stable on a combination of low-dose lisinopril, Jardiance, and metoprolol succinate.  He will continue his medical therapy.  We will see if we can get him medication assistance for Jardiance as it is by far his most expensive medicine.  He had an echo this morning with the formal interpretation currently pending.  On my assessment he has a large akinetic area over the inferolateral wall and global hypokinesis.  I think his LVEF remains less than 35%.  He has moderate paravalvular aortic regurgitation that has been stable over time.  He currently exhibits NYHA functional class II symptoms. Persistent atrial fibrillation (HCC) Currently doing much better, maintaining sinus rhythm on amiodarone.  He continues on warfarin for anticoagulation. 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  Transvalvular gradients are normal.  He is now 8 years out from TAVR.  Continue annual echo follow-up. Coronary artery disease involving native coronary artery of native heart without angina  pectoris Patient is off of clopidogrel since July of this year.  He remains on aspirin 81 mg daily in light of chronic oral anticoagulation.  He is not currently having any anginal symptoms.  Darreld seems to be holding his own, clinically improved since his hospitalization in September and minimally symptomatic at his current activity level.  I think with his advanced age, severity of  LV dysfunction, and family reporting some memory impairment, it is best for him to avoid driving on the highway's and going back to work.  I talked him about this today and he understands my recommendation.  He will follow-up in 3 months with Tereso Newcomer and I will plan to see him back in about 6 months.            Medication Adjustments/Labs and Tests Ordered: Current medicines are reviewed at length with the patient today.  Concerns regarding medicines are outlined above.  No orders of the defined types were placed in this encounter.  Meds ordered this encounter  Medications   furosemide (LASIX) 20 MG tablet    Sig: Take 1 tablet by mouth only as needed    Dispense:  60 tablet    Refill:  3    Patient Instructions  Medication Instructions:  CHANGE Furosemide to as needed only *If you need a refill on your cardiac medications before your next appointment, please call your pharmacy*  Follow-Up: At [facility], you and your health needs are our priority.  As part of our continuing mission to provide you with exceptional heart care, we have created designated Provider Care Teams.  These Care Teams include your primary Cardiologist (physician) and Advanced Practice Providers (APPs -  Physician Assistants and Nurse Practitioners) who all work together to provide you with the care you need, when you need it.  We recommend signing up for the patient portal called "MyChart".  Sign up information is provided on this After Visit Summary.  MyChart is used to connect with patients for Virtual Visits  (Telemedicine).  Patients are able to view lab/test results, encounter notes, upcoming appointments, etc.  Non-urgent messages can be sent to your provider as well.   To learn more about what you can do with MyChart, go to [email or web address].    Your next appointment:   3 month(s)  Provider:   Tereso Newcomer, PA       Signed, [name], MD  10/10/2023 11:33 AM    [facility]

## 2023-10-10 NOTE — Assessment & Plan Note (Signed)
The patient has moderate paravalvular AI, stable from previous echo studies.  Transvalvular gradients are normal.  He is now 8 years out from TAVR.  Continue annual echo follow-up.

## 2023-10-10 NOTE — Assessment & Plan Note (Signed)
Currently doing much better, maintaining sinus rhythm on amiodarone.  He continues on warfarin for anticoagulation.

## 2023-10-11 LAB — TSH: TSH: 4.65 u[IU]/mL — ABNORMAL HIGH (ref 0.450–4.500)

## 2023-10-13 ENCOUNTER — Other Ambulatory Visit (HOSPITAL_COMMUNITY): Payer: Medicare Other

## 2023-10-13 ENCOUNTER — Ambulatory Visit: Payer: Medicare Other | Admitting: Cardiovascular Disease

## 2023-10-31 ENCOUNTER — Telehealth: Payer: Self-pay

## 2023-10-31 NOTE — Telephone Encounter (Signed)
-----   Message from Sherryl Manges sent at 10/30/2023 10:43 AM EST ----- Please Inform Patient that -TSH   is abnormal but improved  will recheck at next appointment  Thanks

## 2023-10-31 NOTE — Telephone Encounter (Signed)
Spoke with pt and advised per Dr Graciela Husbands TSH remains abnormal but improved.  Will repeat at his next appointment.  Pt due to be seen by Dr Graciela Husbands 12/2023.  Appointment has not yet been scheduled.  Will forward to scheduling to assist with appointment.  Pt verbalizes understanding and thanked Charity fundraiser for the call.

## 2023-11-04 DIAGNOSIS — J019 Acute sinusitis, unspecified: Secondary | ICD-10-CM | POA: Diagnosis not present

## 2023-11-04 DIAGNOSIS — R051 Acute cough: Secondary | ICD-10-CM | POA: Diagnosis not present

## 2023-11-04 DIAGNOSIS — R0981 Nasal congestion: Secondary | ICD-10-CM | POA: Diagnosis not present

## 2023-11-04 DIAGNOSIS — J209 Acute bronchitis, unspecified: Secondary | ICD-10-CM | POA: Diagnosis not present

## 2023-11-06 ENCOUNTER — Telehealth: Payer: Self-pay

## 2023-11-06 NOTE — Telephone Encounter (Signed)
Pt's sister-in-law called to report pt is not feeling well, "has a cold", and has cancelled his coumadin clinic apt for tomorrow. She will return call when he is feeling better to RS the apt.

## 2023-11-07 ENCOUNTER — Ambulatory Visit: Payer: Medicare Other

## 2023-11-09 ENCOUNTER — Other Ambulatory Visit: Payer: Self-pay | Admitting: Internal Medicine

## 2023-11-14 DIAGNOSIS — B370 Candidal stomatitis: Secondary | ICD-10-CM | POA: Diagnosis not present

## 2023-11-14 DIAGNOSIS — R21 Rash and other nonspecific skin eruption: Secondary | ICD-10-CM | POA: Diagnosis not present

## 2023-11-15 ENCOUNTER — Other Ambulatory Visit (HOSPITAL_COMMUNITY): Payer: Medicare Other

## 2023-11-21 NOTE — Telephone Encounter (Signed)
Contacted Lucendia Herrlich and advised it is time for a coumadin clinic apt for pt. She made apt for pt for 12/20.

## 2023-11-24 ENCOUNTER — Ambulatory Visit (INDEPENDENT_AMBULATORY_CARE_PROVIDER_SITE_OTHER): Payer: Medicare Other

## 2023-11-24 DIAGNOSIS — Z7901 Long term (current) use of anticoagulants: Secondary | ICD-10-CM

## 2023-11-24 LAB — POCT INR: INR: 2.9 (ref 2.0–3.0)

## 2023-11-24 NOTE — Progress Notes (Signed)
Continue 1 tablet daily except take 1/2 tablet on Mondays, Wednesdays and Fridays. Recheck in 6 weeks.

## 2023-11-24 NOTE — Patient Instructions (Addendum)
Pre visit review using our clinic review tool, if applicable. No additional management support is needed unless otherwise documented below in the visit note.  Continue 1 tablet daily except take 1/2 tablet on Mondays, Wednesdays and Fridays. Recheck in 6 weeks.

## 2023-12-05 ENCOUNTER — Other Ambulatory Visit: Payer: Self-pay | Admitting: Family Medicine

## 2023-12-29 ENCOUNTER — Other Ambulatory Visit: Payer: Self-pay | Admitting: Internal Medicine

## 2024-01-02 ENCOUNTER — Ambulatory Visit (INDEPENDENT_AMBULATORY_CARE_PROVIDER_SITE_OTHER): Payer: Medicare Other

## 2024-01-02 DIAGNOSIS — Z7901 Long term (current) use of anticoagulants: Secondary | ICD-10-CM | POA: Diagnosis not present

## 2024-01-02 LAB — POCT INR: INR: 2.5 (ref 2.0–3.0)

## 2024-01-02 NOTE — Progress Notes (Addendum)
Pt requested BP taken today.  BP 112/52, HR 55, O2 sat 96% These are normal reading for pt. Continue 1 tablet daily except take 1/2 tablet on Mondays, Wednesdays and Fridays. Recheck in 6 weeks.

## 2024-01-02 NOTE — Patient Instructions (Addendum)
Pre visit review using our clinic review tool, if applicable. No additional management support is needed unless otherwise documented below in the visit note.  Continue 1 tablet daily except take 1/2 tablet on Mondays, Wednesdays and Fridays. Recheck in 6 weeks.

## 2024-01-10 ENCOUNTER — Other Ambulatory Visit: Payer: Self-pay | Admitting: *Deleted

## 2024-01-10 ENCOUNTER — Ambulatory Visit (INDEPENDENT_AMBULATORY_CARE_PROVIDER_SITE_OTHER): Payer: Medicare Other | Admitting: Internal Medicine

## 2024-01-10 ENCOUNTER — Encounter: Payer: Self-pay | Admitting: Physician Assistant

## 2024-01-10 ENCOUNTER — Encounter: Payer: Self-pay | Admitting: Internal Medicine

## 2024-01-10 ENCOUNTER — Ambulatory Visit: Payer: Medicare Other | Attending: Physician Assistant | Admitting: Physician Assistant

## 2024-01-10 VITALS — BP 124/60 | HR 56 | Ht 61.0 in | Wt 123.0 lb

## 2024-01-10 VITALS — BP 124/60 | HR 56 | Resp 16 | Ht 61.0 in | Wt 123.0 lb

## 2024-01-10 DIAGNOSIS — I350 Nonrheumatic aortic (valve) stenosis: Secondary | ICD-10-CM

## 2024-01-10 DIAGNOSIS — I472 Ventricular tachycardia, unspecified: Secondary | ICD-10-CM

## 2024-01-10 DIAGNOSIS — I2510 Atherosclerotic heart disease of native coronary artery without angina pectoris: Secondary | ICD-10-CM

## 2024-01-10 DIAGNOSIS — I4819 Other persistent atrial fibrillation: Secondary | ICD-10-CM

## 2024-01-10 DIAGNOSIS — R001 Bradycardia, unspecified: Secondary | ICD-10-CM

## 2024-01-10 DIAGNOSIS — Z79899 Other long term (current) drug therapy: Secondary | ICD-10-CM | POA: Diagnosis not present

## 2024-01-10 DIAGNOSIS — I5022 Chronic systolic (congestive) heart failure: Secondary | ICD-10-CM

## 2024-01-10 MED ORDER — FUROSEMIDE 20 MG PO TABS
20.0000 mg | ORAL_TABLET | ORAL | 3 refills | Status: DC
Start: 2024-01-10 — End: 2024-04-08

## 2024-01-10 MED ORDER — AMIODARONE HCL 200 MG PO TABS: 200.0000 mg | ORAL_TABLET | Freq: Every day | ORAL | Status: AC

## 2024-01-10 NOTE — Assessment & Plan Note (Signed)
 EF 30 by echo 10/2023.  He is NYHA IIb.  He does have evidence of volume excess on exam today.  He often will take Lasix  for 1 week at a time if he has increased lower extremity edema.  Symptomatically, he has been doing well over the last several weeks.  He is walking on a daily basis.  He has diffuse crackles on exam.  I reviewed his notes from Dr. Fernande.  This was noted in the past in July 2024 which prompted a CT scan. -Continue Jardiance  10 mg daily, lisinopril  2.5 mg daily, metoprolol  succinate 25 mg daily -Change furosemide  to 20 mg every Monday, Wednesday, Friday -BMET 2 weeks -If renal function and potassium remain stable, we could consider addition of spironolactone 

## 2024-01-10 NOTE — Assessment & Plan Note (Signed)
 He has follow-up with Dr. Fernande later today.  He remains on amiodarone  20 mg daily.  His caregiver does note some ataxia at times.  I have asked her to discuss with Dr. Fernande today to see if his dose of amiodarone  should be reduced.  I reviewed Dr. Celine previous notes and plan is to repeat TSH today with him.

## 2024-01-10 NOTE — Progress Notes (Signed)
 Patient Care Team: Micheal Wolm ORN, MD as PCP - General (Family Medicine) Wonda Sharper, MD as PCP - Cardiology (Cardiology) Fernande Elspeth BROCKS, MD as PCP - Electrophysiology (Cardiology) Lionell Jon DEL, [facility] (Pharmacist)   HPI  Jesus [name] is a 83 y.o. 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  Ischemic cardiomyopathy with remote CABG, valvular heart disease and remote TAVR.  A Atrial fibrillation for which also on  amiodarone ; this was uptitrated in the setting of his ventricular tachycardia with its subsequent obliteration.  anticoagulation with warfarin   Interval hospitalization 9/24 CHF with AF RVR ( EF lower  (See Below) )  reloaded with amiodarone     Much improved since coming out of hospital.  Has some edema.  In the past a caregiver is giving him Lasix  daily for a week.  He is now on it 3 times a week at baseline.  No chest pain.  No palpitations.  Patient denies symptoms of respiratory, GI intolerance, sun sensitivity, neurological symptoms attributable to amiodarone .        DATE TEST EF   6/*22 Echo  45-50% Prior IMI  3/13 LHC    % LIMA-LADp; SVG-OM1-OM2-OM3p SVG-RVM-p SVG-PDA 80%>Stent   3/23 Echo  30-35% TAVR functioning well   9/24 Echo  <20%   11/24 Echo  30%    Date Cr K Hgb TSH LFTs  12/22    4.32   4/23 1.33 4.8 12.5  2.65 22   7/24 1.14 4.3 12.9 5.03 24  9/24 1.49  12.8 4.65 46     Records and Results Reviewed   Past Medical History:  Diagnosis Date   Acute myocardial infarction of inferior wall (HCC) 1980   Aortic stenosis    mild with a mean aortic valve gradient of 12 mmHg   Atrial fibrillation (HCC)    holding sinus rhythm on Amiodarone    CAD (coronary artery disease)    a. S/P Ant MI 1980;  b. 1997 S/P CABG x 8 (LIMA to diag-LAD, SVG to OM1-OM2-OM3, SVG to [facility] - Dr Tanda);  c. 01/2015 Cath: 3VD, 8/8 patent grafts.    Cardiomyopathy    Dysrhythmia    Esophageal reflux    GERD (gastroesophageal reflux disease)    Headache    History of colonoscopy    History of transesophageal echocardiography (TEE) for monitoring    Other and unspecified hyperlipidemia    Paroxysmal atrial fibrillation (HCC) 02/20/2022   S/P angioplasty with stent 02/17/22 DES to proximal VG to PDA 02/20/2022   S/P TAVR (transcatheter aortic valve replacement)    a. 03/2015 26 mm Edwards Sapien 3 transcatheter heart valve placed via open left transfemoral approach.   Severe aortic stenosis 08/10/2012   Skin lesions, generalized    facial which may represent actinic keratoses and possible photosensitivity from Amiodarone    Unspecified essential hypertension     Past Surgical History:  Procedure Laterality Date   CARDIAC CATHETERIZATION     CARDIOVERSION  11/18/2006   Dr. Dillard   CATARACT EXTRACTION W/ INTRAOCULAR LENS IMPLANT  April '13  (Dr. Rosan)   left eye only   CHOLECYSTECTOMY     COLONOSCOPY WITH PROPOFOL  N/A 07/11/2023   Procedure: COLONOSCOPY WITH PROPOFOL ;  Surgeon: Avram Lupita BRAVO, MD;  Location: [facility] ENDOSCOPY;  Service: Gastroenterology;  Laterality: N/A;   CORONARY ARTERY BYPASS GRAFT  02/09/1996   LIMA to diag-LAD, SVG  to OM1-OM2-OM3, SVG to [facility]   CORONARY STENT INTERVENTION N/A 02/17/2022   Procedure: CORONARY STENT INTERVENTION;  Surgeon: Verlin Lonni BIRCH, MD;  Location: [facility] INVASIVE CV LAB;  Service: Cardiovascular;  Laterality: N/A;   CORONARY/GRAFT ANGIOGRAPHY N/A 02/17/2022   Procedure: CORONARY/GRAFT ANGIOGRAPHY;  Surgeon: Verlin Lonni BIRCH, MD;  Location: [facility] INVASIVE CV LAB;  Service: Cardiovascular;  Laterality: N/A;   EYE SURGERY     HEMOSTASIS CLIP PLACEMENT  07/11/2023   Procedure: HEMOSTASIS CLIP PLACEMENT;  Surgeon: Avram Lupita BRAVO, MD;  Location: [facility] ENDOSCOPY;  Service: Gastroenterology;;   HEMOSTASIS CONTROL  07/11/2023   Procedure: HEMOSTASIS CONTROL;  Surgeon: Avram Lupita BRAVO, MD;   Location: [facility] ENDOSCOPY;  Service: Gastroenterology;;   LEFT AND RIGHT HEART CATHETERIZATION WITH CORONARY/GRAFT ANGIOGRAM N/A 01/26/2015   Procedure: LEFT AND RIGHT HEART CATHETERIZATION WITH EL BILE;  Surgeon: Ozell BIRCH Fell, MD;  Location: [facility] CATH LAB;  Service: Cardiovascular;  Laterality: N/A;   POLYPECTOMY  07/11/2023   Procedure: POLYPECTOMY;  Surgeon: Avram Lupita BRAVO, MD;  Location: [facility] ENDOSCOPY;  Service: Gastroenterology;;   TEE WITHOUT CARDIOVERSION N/A 03/10/2015   Procedure: TRANSESOPHAGEAL ECHOCARDIOGRAM (TEE);  Surgeon: Ozell Fell, MD;  Location[facility];  Service: Open Heart Surgery;  Laterality: N/A;   TONSILLECTOMY     TRANSCATHETER AORTIC VALVE REPLACEMENT, TRANSFEMORAL N/A 03/10/2015   Procedure: TRANSCATHETER AORTIC VALVE REPLACEMENT, TRANSFEMORAL;  Surgeon: Ozell Fell, MD;  Location: [facility] OR;  Service: Open Heart Surgery;  Laterality: N/A;    No outpatient medications have been marked as taking for the 01/10/24 encounter (Office Visit) with Fernande Elspeth BROCKS, MD.    Allergies  Allergen Reactions   Iodine  Swelling and Other (See Comments)    Skin swells- CAN DRINK BOOST AND ENSURE WITH NO ISSUES   Amlodipine  Swelling and Other (See Comments)    Swelling?   Codeine Other (See Comments)    Reaction not recalled      Review of Systems negative except from HPI and PMH  Physical Exam BP 124/60   Pulse (!) 56   Ht 5' 1 (1.549 m)   Wt 123 lb (55.8 kg)   SpO2 97%   BMI 23.24 kg/m  Well developed and nourished in no acute distress HENT normal Neck supple with JVP-  10 Crackles B 1/3 Regular rate and rhythm, no murmurs or gallops Abd-soft with active BS No Clubbing cyanosis 2+edema Skin-warm and dry A & Oriented  Grossly normal sensory and motor function  ECG sinus rhythm at 56 LBBB/IVCD Q wave 3.F     CrCl cannot be calculated (Patient's most recent lab result is older than the maximum 21 days allowed.).   Assessment and   Plan 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-largely asymptomatic   Non-STEMI status post PCI   Sinus bradycardia   Ischemic cardiomyopathy with remote CABG   Valvular heart disease-TAVR 2016   Atrial fibrillation persistent    High Risk Medication Surveillance Amiodarone   HFrEF acute/chronic  Hyper transaminasemia    Patient is volume overloaded and will increase his diuretics.  Glendia Ferrier increased it to 3 times a week at baseline, we will have him do his 1 week flush and then resume 3 days a week.  Clarifying his amiodarone  dosage to 200 mg a day we will continue.  His last liver enzymes were elevated.  We will check them again.

## 2024-01-10 NOTE — Assessment & Plan Note (Addendum)
 History of remote MI 44 and bypass in 1997.  He had a non-STEMI in 2023 treated a DES to the vein graft to the PDA.  He is doing well without chest discomfort to suggest angina.  Continue ASA 81 mg daily, Lipitor  80 mg daily, metoprolol  succinate 25 mg daily, nitroglycerin  as needed.

## 2024-01-10 NOTE — Assessment & Plan Note (Signed)
 Maintaining sinus rhythm today by EKG.  Coumadin  is managed by primary care.

## 2024-01-10 NOTE — Progress Notes (Signed)
 Cardiology Office Note:    Date:  01/10/2024  ID:  Jesus [name], DOB 1941/11/21, MRN [id number] PCP: Micheal Wolm ORN, MD  [facility] Providers Cardiologist:  Ozell Fell, MD Electrophysiologist:  Elspeth Sage, MD       Patient Profile:      (HFrEF) heart failure with reduced ejection fraction Ischemic cardiomyopathy TTE 10/10/2023: EF 30, severe inferior/inferoseptal/inferolateral HK/AK, low normal RVSF, mild RVE, severe LAE, moderate RAE, mild MR, s/p TAVR with mean gradient 10, mild-moderate AI, moderate PI Coronary artery disease  Hx of remote Ant MI in 1980 s/p CABG in 1997 NSTEMI 02/2022 s/p 3.5 x 12 mm DES to S-PDA  [facility] 02/17/2022: 6/6 grafts patent, SVG-PDA 80, RPAV 100, OM 380 Persistent Atrial fibrillation  Coumadin  Rx Severe aortic stenosis s/p TAVR Mod paravalvular AI Ventricular tachycardia   Hypertension  Hyperlipidemia   DNR (Do Not Resuscitate)         Jesus [name] is a 83 y.o. male who returns for follow up of CHF, CAD, AFib. He was last seen by Dr. Fell in 10/2023. He had been admitted in 08/2023 with a/c CHF and AF w RVR. His EF was noted to be < 20.    Discussed the use of AI scribe software for clinical note transcription with the patient, who gave verbal consent to proceed.  History of Present Illness   He is accompanied by his caregiver, Rocky. He has had no significant changes in his cardiac conditions since his last visit in November. He retired from his job. 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 This has been a difficult adjustment. He tries to stay active by walking and engaging in activities around the house. No chest discomfort or shortness of breath when lying flat. Experienced ankle swelling two weeks ago, resolved with a seven-day course of Lasix . Monitors weight daily, stable between 116 and 117 pounds. Follows a low-sodium diet, consuming fresh fruits, vegetables, and boiled chicken without added salt. No episodes of passing  out or fainting. Balance has been off more than usual, uses a walking stick for support. Walks in his neighborhood, [place], and sometimes gets slightly out of breath after walking up the second hill but recovers quickly after resting.       Review of Systems  Gastrointestinal:  Negative for hematochezia and melena.  Genitourinary:  Negative for hematuria.  -See HPI    Studies Reviewed:   EKG Interpretation Date/Time:  Wednesday January 10 2024 13:43:16 EST Ventricular Rate:  56 PR Interval:  212 QRS Duration:  184 QT Interval:  498 QTC Calculation: 480 R Axis:   11  Text Interpretation: Sinus bradycardia with 1st degree A-V block Left bundle branch block No significant change since last tracing 07/09/23 Confirmed by Lelon Hamilton [phone]) on 01/10/2024 1:51:28 PM    Risk Assessment/Calculations:    CHA2DS2-VASc Score = 5   This indicates a 7.2% annual risk of stroke. The patient's score is based upon: CHF History: 1 HTN History: 1 Diabetes History: 0 Stroke History: 0 Vascular Disease History: 1 Age Score: 2 Gender Score: 0            Physical Exam:   VS:  BP 124/60 (BP Location: Right Arm, Patient Position: Sitting, Cuff Size: Normal)   Pulse (!) 56   Resp 16   Ht 5' 1 (1.549 m)   Wt 123 lb (55.8 kg)   SpO2 97%   BMI 23.24 kg/m    Wt Readings from  Last 3 Encounters:  01/10/24 123 lb (55.8 kg)  01/10/24 123 lb (55.8 kg)  10/10/23 117 lb 12.8 oz (53.4 kg)    Constitutional:      Appearance: Healthy appearance. Not in distress.  Neck:     Vascular: JVD elevated.  Pulmonary:     Breath sounds: Normal breath sounds. No wheezing. No rales.  Cardiovascular:     Bradycardia present. Regular rhythm.     Murmurs: There is a grade 3/6 systolic murmur at the URSB and ULSB.  Edema:    Peripheral edema present.    Ankle: bilateral 1+ edema of the ankle. Abdominal:     General: There is no distension.     Palpations: Abdomen is soft.        Assessment and  Plan:   Assessment & Plan Chronic HFrEF (heart failure with reduced ejection fraction) (HCC) EF 30 by echo 10/2023.  He is NYHA IIb.  He does have evidence of volume excess on exam today.  He often will take Lasix  for 1 week at a time if he has increased lower extremity edema.  Symptomatically, he has been doing well over the last several weeks.  He is walking on a daily basis.  He has diffuse crackles on exam.  I reviewed his notes from Dr. Fernande.  This was noted in the past in July 2024 which prompted a CT scan. -Continue Jardiance  10 mg daily, lisinopril  2.5 mg daily, metoprolol  succinate 25 mg daily -Change furosemide  to 20 mg every Monday, Wednesday, Friday -BMET 2 weeks -If renal function and potassium remain stable, we could consider addition of spironolactone  Persistent atrial fibrillation (HCC) Maintaining sinus rhythm today by EKG.  Coumadin  is managed by primary care. Coronary artery disease involving native coronary artery of native heart without angina pectoris History of remote MI 1980 and bypass in 1997.  He had a non-STEMI in 2023 treated a DES to the vein graft to the PDA.  He is doing well without chest discomfort to suggest angina.  Continue ASA 81 mg daily, Lipitor  80 mg daily, metoprolol  succinate 25 mg daily, nitroglycerin  as needed. Severe aortic stenosis S/p TAVR.  Most recent echocardiogram in November 2024 demonstrated mild to moderate perivalvular leak.  Gradients are stable.  Continue SBE prophylaxis. VT (ventricular tachycardia) (HCC) - 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-largely asymptomatic He has follow-up with Dr. Fernande later today.  He remains on amiodarone  20 mg daily.  His caregiver does note some ataxia at times.  I have asked her to discuss with Dr. Fernande today to see if his dose of amiodarone  should be reduced.  I reviewed Dr. Celine previous notes and plan is to repeat TSH today with him.      Dispo:  Return in about 3  months (around 04/08/2024) for Routine Follow Up, w/ Dr. Wonda.  Signed, Glendia Ferrier, PA-C

## 2024-01-10 NOTE — Assessment & Plan Note (Signed)
 S/p TAVR.  Most recent echocardiogram in November 2024 demonstrated mild to moderate perivalvular leak.  Gradients are stable.  Continue SBE prophylaxis.

## 2024-01-10 NOTE — Patient Instructions (Addendum)
 Medication Instructions:  Your physician has recommended you make the following change in your medication:   ** Continue Amiodarone  200mg   1 tablet by mouth daily.  ** Increase Furosemide  20mg  to 1 tablet by mouth daily x 7 days then return to your normal dose of 1 tablet by mouth on Monday, Wednesday and Friday.  *If you need a refill on your cardiac medications before your next appointment, please call your pharmacy*   Lab Work: CMET in 3 weeks  If you have labs (blood work) drawn today and your tests are completely normal, you will receive your results only by: MyChart Message (if you have MyChart) OR A paper copy in the mail If you have any lab test that is abnormal or we need to change your treatment, we will call you to review the results.   Testing/Procedures: None ordered.    Follow-Up: At [facility], you and your health needs are our priority.  As part of our continuing mission to provide you with exceptional heart care, we have created designated Provider Care Teams.  These Care Teams include your primary Cardiologist (physician) and Advanced Practice Providers (APPs -  Physician Assistants and Nurse Practitioners) who all work together to provide you with the care you need, when you need it.  We recommend signing up for the patient portal called MyChart.  Sign up information is provided on this After Visit Summary.  MyChart is used to connect with patients for Virtual Visits (Telemedicine).  Patients are able to view lab/test results, encounter notes, upcoming appointments, etc.  Non-urgent messages can be sent to your provider as well.   To learn more about what you can do with MyChart, go to [email or web address].    Your next appointment:   6 months

## 2024-01-10 NOTE — Patient Instructions (Signed)
 Medication Instructions:  Your physician has recommended you make the following change in your medication: '  TAKE Lasix  20 mg every MONDAY, WEDNESDAY, & FRIDAY'S  *If you need a refill on your cardiac medications before your next appointment, please call your pharmacy*   Lab Work: 2 WEEKS, GO TO A [facility] NEAR YOU FOR:  BMET  If you have labs (blood work) drawn today and your tests are completely normal, you will receive your results only by: MyChart Message (if you have MyChart) OR A paper copy in the mail If you have any lab test that is abnormal or we need to change your treatment, we will call you to review the results.   Testing/Procedures: None ordered   Follow-Up: At [facility], you and your health needs are our priority.  As part of our continuing mission to provide you with exceptional heart care, we have created designated Provider Care Teams.  These Care Teams include your primary Cardiologist (physician) and Advanced Practice Providers (APPs -  Physician Assistants and Nurse Practitioners) who all work together to provide you with the care you need, when you need it.  We recommend signing up for the patient portal called MyChart.  Sign up information is provided on this After Visit Summary.  MyChart is used to connect with patients for Virtual Visits (Telemedicine).  Patients are able to view lab/test results, encounter notes, upcoming appointments, etc.  Non-urgent messages can be sent to your provider as well.   To learn more about what you can do with MyChart, go to [email or web address].    Your next appointment:   3 month(s)  Provider:   Ozell Fell, MD     Other Instructions   1st Floor: - Lobby - Registration  - Pharmacy  - Lab - Cafe  2nd Floor: - PV Lab - Diagnostic Testing (echo, CT, nuclear med)  3rd Floor: - Vacant  4th Floor: - TCTS (cardiothoracic surgery) - AFib Clinic - Structural Heart Clinic - Vascular Surgery  -  Vascular Ultrasound  5th Floor: - HeartCare Cardiology (general and EP) - Clinical Pharmacy for coumadin , hypertension, lipid, weight-loss medications, and med management appointments    Valet parking services will be available as well.

## 2024-01-16 ENCOUNTER — Telehealth: Payer: Self-pay

## 2024-01-16 ENCOUNTER — Ambulatory Visit: Payer: Medicare Other

## 2024-01-16 NOTE — Telephone Encounter (Signed)
Pt's nursing assistance requested to change pt coumadin clinic apt from today to next week due to her being sick. RS apt. [name] reports no changes for pt. Advised if any changes before apt next week to contact coumadin clinic. Erin verbalized understanding.

## 2024-01-17 ENCOUNTER — Other Ambulatory Visit: Payer: Self-pay | Admitting: Cardiovascular Disease

## 2024-01-26 ENCOUNTER — Ambulatory Visit: Payer: Medicare Other

## 2024-01-26 DIAGNOSIS — Z7901 Long term (current) use of anticoagulants: Secondary | ICD-10-CM

## 2024-01-26 LAB — POCT INR: INR: 3.2 — AB (ref 2.0–3.0)

## 2024-01-26 NOTE — Progress Notes (Signed)
Pt's nurse and cardiology note report pt has some fluid buildup in lungs and also lower extremity edema. This will cause an increase in INR. Pt took warfarin today. Hold warfarin tomorrow and then continue 1 tablet daily except take 1/2 tablet on Mondays, Wednesdays and Fridays. Recheck in 2 weeks.

## 2024-01-26 NOTE — Patient Instructions (Addendum)
Pre visit review using our clinic review tool, if applicable. No additional management support is needed unless otherwise documented below in the visit note.  Hold warfarin tomorrow and then continue 1 tablet daily except take 1/2 tablet on Mondays, Wednesdays and Fridays. Recheck in 2 weeks.

## 2024-02-02 DIAGNOSIS — I5022 Chronic systolic (congestive) heart failure: Secondary | ICD-10-CM | POA: Diagnosis not present

## 2024-02-02 DIAGNOSIS — I4819 Other persistent atrial fibrillation: Secondary | ICD-10-CM | POA: Diagnosis not present

## 2024-02-02 DIAGNOSIS — I472 Ventricular tachycardia, unspecified: Secondary | ICD-10-CM | POA: Diagnosis not present

## 2024-02-02 DIAGNOSIS — I2510 Atherosclerotic heart disease of native coronary artery without angina pectoris: Secondary | ICD-10-CM | POA: Diagnosis not present

## 2024-02-02 DIAGNOSIS — I350 Nonrheumatic aortic (valve) stenosis: Secondary | ICD-10-CM | POA: Diagnosis not present

## 2024-02-03 LAB — BASIC METABOLIC PANEL
BUN/Creatinine Ratio: 17 (ref 10–24)
BUN: 22 mg/dL (ref 8–27)
CO2: 24 mmol/L (ref 20–29)
Calcium: 9.1 mg/dL (ref 8.6–10.2)
Chloride: 107 mmol/L — ABNORMAL HIGH (ref 96–106)
Creatinine, Ser: 1.27 mg/dL (ref 0.76–1.27)
Glucose: 96 mg/dL (ref 70–99)
Potassium: 4.8 mmol/L (ref 3.5–5.2)
Sodium: 144 mmol/L (ref 134–144)
eGFR: 56 mL/min/{1.73_m2} — ABNORMAL LOW (ref >59)

## 2024-02-05 ENCOUNTER — Telehealth: Payer: Self-pay

## 2024-02-05 NOTE — Telephone Encounter (Signed)
 Left voicemail to return call to office

## 2024-02-05 NOTE — Telephone Encounter (Signed)
Pt advised his lab results.  

## 2024-02-05 NOTE — Telephone Encounter (Signed)
 Pt returning call, requesting cb

## 2024-02-05 NOTE — Telephone Encounter (Signed)
-----   Message from [place] sent at 02/05/2024  8:56 AM EST ----- Creatinine (kidney function), potassium normal. PLAN:  - Continue current medications/treatment plan and follow up as scheduled.  Tereso Newcomer, PA-C    02/05/2024 8:54 AM

## 2024-02-08 DIAGNOSIS — R0981 Nasal congestion: Secondary | ICD-10-CM | POA: Diagnosis not present

## 2024-02-08 DIAGNOSIS — R051 Acute cough: Secondary | ICD-10-CM | POA: Diagnosis not present

## 2024-02-09 ENCOUNTER — Ambulatory Visit: Payer: Medicare Other

## 2024-02-09 DIAGNOSIS — Z7901 Long term (current) use of anticoagulants: Secondary | ICD-10-CM | POA: Diagnosis not present

## 2024-02-09 LAB — POCT INR: INR: 3.3 — AB (ref 2.0–3.0)

## 2024-02-09 NOTE — Progress Notes (Signed)
 Pt has URI and was prescribed rocephin injection yesterday and also 10 days of doxycycline. Pt already took warfarin today. Hold warfarin tomorrow and then change weekly dose to take 1/2 tablet daily except take 1 tablet on Monday, Wednesday, and Friday. Recheck in 1 weeks.

## 2024-02-09 NOTE — Patient Instructions (Addendum)
 Pre visit review using our clinic review tool, if applicable. No additional management support is needed unless otherwise documented below in the visit note.  Hold warfarin tomorrow and then change weekly dose to take 1/2 tablet daily except take 1 tablet on Monday, Wednesday, and Friday. Recheck in 1 weeks.

## 2024-02-09 NOTE — Progress Notes (Signed)
 Pt has been made aware of normal result and verbalized understanding.  jw

## 2024-02-12 ENCOUNTER — Telehealth: Payer: Self-pay

## 2024-02-12 NOTE — Telephone Encounter (Signed)
 [name], pt's sitter, reports she has not seen pt since Friday, 3/7, and today pt has a lot of bruising on his hands and also has a subconjuctival hemorrhage in one eye. Pt is a poor historian but reports the blood in his eye occurred due to him rubbing his eye vigorously.  Last two INR results, were supratherapeutic. Last INR on 3/6 was 3.3 and INR before that on 2/21 was 3.2. Dosing was reduced at last apt on 3/6 and pt was rescheduled for INR check for tomorrow, 3/11. Pt denies any blood in stool or urine or any other abnormal bruising or bleeding. Pt denies any other changes.   Lanier Prude, if pt is concerned he can go to the ER. Advised if any s/s of bleeding to call 911. Erin verbalized understanding and will assure pt does make his coumadin clinic apt tomorrow for INR check.

## 2024-02-12 NOTE — Telephone Encounter (Signed)
 Noted.  Agree with advice.  Kristian Covey MD [facility]

## 2024-02-13 ENCOUNTER — Ambulatory Visit (INDEPENDENT_AMBULATORY_CARE_PROVIDER_SITE_OTHER)

## 2024-02-13 DIAGNOSIS — Z7901 Long term (current) use of anticoagulants: Secondary | ICD-10-CM

## 2024-02-13 LAB — POCT INR: INR: 1.8 — AB (ref 2.0–3.0)

## 2024-02-13 NOTE — Progress Notes (Signed)
 [name], pt's sitter, called yesterday to report she has not seen pt since Friday, 3/7, and yesterday pt has a lot of bruising on his hands and also has a subconjuctival hemorrhage in one eye. Pt is a poor historian but reports the blood in his eye occurred due to him rubbing his eye vigorously. Pt has URI and was prescribed rocephin injection yesterday and also 10 days of doxycycline. Pt has also been taking robitussin which can interact with warfarin. Gave pt list of OTC medication ok to take with warfarin.  Increase dose today to take 1 tablet and then continue 1/2 tablet daily except take 1 tablet on Monday, Wednesday, and Friday. Recheck in 1 weeks.    Pt requested BP check.  BP 108-52 (normal for pt) HR 53 O2 sat 97%

## 2024-02-13 NOTE — Patient Instructions (Addendum)
 Pre visit review using our clinic review tool, if applicable. No additional management support is needed unless otherwise documented below in the visit note.  Increase dose today to take 1 tablet and then continue 1/2 tablet daily except take 1 tablet on Monday, Wednesday, and Friday. Recheck in 1 weeks.

## 2024-02-16 ENCOUNTER — Other Ambulatory Visit: Payer: Self-pay | Admitting: Family Medicine

## 2024-02-16 DIAGNOSIS — Z7901 Long term (current) use of anticoagulants: Secondary | ICD-10-CM

## 2024-02-16 NOTE — Telephone Encounter (Signed)
 Pt is compliant with warfarin management and PCP apts.  Sent in refill of warfarin to requested pharmacy.

## 2024-02-20 ENCOUNTER — Ambulatory Visit (INDEPENDENT_AMBULATORY_CARE_PROVIDER_SITE_OTHER)

## 2024-02-20 DIAGNOSIS — Z7901 Long term (current) use of anticoagulants: Secondary | ICD-10-CM | POA: Diagnosis not present

## 2024-02-20 LAB — POCT INR: INR: 2.2 (ref 2.0–3.0)

## 2024-02-20 NOTE — Progress Notes (Signed)
 Pt missed dose two days ago.  Continue 1/2 tablet daily except take 1 tablet on Monday, Wednesday, and Friday. Recheck in 3 weeks.  Pt's sitter, [name], reports pt had an elevated, 160/62, BP last week. She also noticed bilateral lower leg edema, so she assured pt took his lasix.  Pt also has some redness on both hands. This could be a potential side effect of the amoxicillin pt finished two days ago. Advised if any worsening or if it is not improving by the end of the week to contact PCP office for an apt. Advised if any difficulty breathing or facial, throat, or tongue swelling to call 911. Pt and his sitter verbalized understanding.  Pt requested BP check. BP 112/48 HR 48 O2 sat 98% These are pt's normal readings.

## 2024-02-20 NOTE — Patient Instructions (Addendum)
 Pre visit review using our clinic review tool, if applicable. No additional management support is needed unless otherwise documented below in the visit note.  Continue 1/2 tablet daily except take 1 tablet on Monday, Wednesday, and Friday. Recheck in 3 weeks.

## 2024-02-26 ENCOUNTER — Ambulatory Visit (INDEPENDENT_AMBULATORY_CARE_PROVIDER_SITE_OTHER): Admitting: Family Medicine

## 2024-02-26 ENCOUNTER — Encounter: Payer: Self-pay | Admitting: Family Medicine

## 2024-02-26 VITALS — BP 132/58 | HR 47 | Temp 98.2°F | Wt 123.8 lb

## 2024-02-26 DIAGNOSIS — I5022 Chronic systolic (congestive) heart failure: Secondary | ICD-10-CM | POA: Diagnosis not present

## 2024-02-26 DIAGNOSIS — L568 Other specified acute skin changes due to ultraviolet radiation: Secondary | ICD-10-CM | POA: Diagnosis not present

## 2024-02-26 DIAGNOSIS — R7303 Prediabetes: Secondary | ICD-10-CM

## 2024-02-26 LAB — POCT GLYCOSYLATED HEMOGLOBIN (HGB A1C): Hemoglobin A1C: 5.8 % — AB (ref 4.0–5.6)

## 2024-02-26 NOTE — Progress Notes (Signed)
 Established Patient Office Visit  Subjective   Patient ID: Jesus [name], male    DOB: Apr 07, 1941  Age: 83 y.o. MRN: [id number]  Chief Complaint  Patient presents with   Skin Problem    HPI   Jesus [name] is here with his aid, [name], with increased erythema dorsum of both hands.  Recent history is that he went to urgent care and was given Rocephin and started on doxycycline for respiratory infection..  Has completed the antibiotics now but had been out in the sun quite a bit (while on antibiotic) and apparently developed photosensitivity rash.  He also had some sunburn on his scalp occipital area and also dorsum of both ears.  Did not have any erythema in areas that were covered up with clothing.  According to aide the erythema is starting to fade already.  Denied any systemic rash.  No fever.  No cough at this time.  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  Medications reviewed.  Compliant with all.  No recent increased peripheral edema.  No orthopnea.  No significant dyspnea.  Still on Coumadin and followed through Coumadin clinic.  Recent electrolytes and renal function stable.  He does have history of hyperglycemia.  Last A1c 6.0%.  Not monitoring blood sugars regularly.  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 S/P Ant MI 1980;  b. 1997 S/P CABG x 8 (LIMA to diag-LAD, SVG to OM1-OM2-OM3, SVG to [facility] - Dr Andrey Campanile);  c. 01/2015 Cath: 3VD, 8/8 patent grafts.   Cardiomyopathy    Dysrhythmia    Esophageal reflux    GERD (gastroesophageal reflux disease)    Headache    History of colonoscopy    History of  transesophageal echocardiography (TEE) for monitoring    Other and unspecified hyperlipidemia    Paroxysmal atrial fibrillation (HCC) 02/20/2022   S/P angioplasty with stent 02/17/22 DES to proximal VG to PDA 02/20/2022   S/P TAVR (transcatheter aortic valve replacement)    a. 03/2015 26 mm Edwards Sapien 3 transcatheter heart valve placed via open left transfemoral approach.   Severe aortic stenosis 08/10/2012   Skin lesions, generalized    facial which may represent actinic keratoses and possible photosensitivity from Amiodarone   Unspecified essential hypertension    Past Surgical History:  Procedure Laterality Date   CARDIAC CATHETERIZATION     CARDIOVERSION  11/18/2006   Dr. Jacklynn Bue   CATARACT EXTRACTION W/ INTRAOCULAR LENS IMPLANT  April '13  (Dr. Dagoberto Ligas)   left eye only   CHOLECYSTECTOMY     COLONOSCOPY WITH PROPOFOL N/A 07/11/2023   Procedure: COLONOSCOPY WITH PROPOFOL;  Surgeon: [name], MD;  Location: [facility] ENDOSCOPY;  Service: Gastroenterology;  Laterality: N/A;   CORONARY ARTERY BYPASS GRAFT  02/09/1996   LIMA to diag-LAD, SVG to OM1-OM2-OM3, SVG to [facility]   CORONARY STENT INTERVENTION N/A 02/17/2022   Procedure: CORONARY STENT INTERVENTION;  Surgeon: Kathleene Hazel, MD;  Location: [facility] INVASIVE CV LAB;  Service: Cardiovascular;  Laterality: N/A;   CORONARY/GRAFT ANGIOGRAPHY N/A 02/17/2022   Procedure: CORONARY/GRAFT ANGIOGRAPHY;  Surgeon: Kathleene Hazel, MD;  Location: [facility]  INVASIVE CV LAB;  Service: Cardiovascular;  Laterality: N/A;   EYE SURGERY     HEMOSTASIS CLIP PLACEMENT  07/11/2023   Procedure: HEMOSTASIS CLIP PLACEMENT;  Surgeon: [name], MD;  Location: [facility] ENDOSCOPY;  Service: Gastroenterology;;   HEMOSTASIS CONTROL  07/11/2023   Procedure: HEMOSTASIS CONTROL;  Surgeon: [name], MD;  Location: [facility] ENDOSCOPY;  Service: Gastroenterology;;   LEFT AND RIGHT HEART CATHETERIZATION WITH CORONARY/GRAFT ANGIOGRAM N/A 01/26/2015   Procedure:  LEFT AND RIGHT HEART CATHETERIZATION WITH Isabel Caprice;  Surgeon: Micheline Chapman, MD;  Location[facility] CATH LAB;  Service: Cardiovascular;  Laterality: N/A;   POLYPECTOMY  07/11/2023   Procedure: POLYPECTOMY;  Surgeon: [name], MD;  Location: [facility] ENDOSCOPY;  Service: Gastroenterology;;   TEE WITHOUT CARDIOVERSION N/A 03/10/2015   Procedure: TRANSESOPHAGEAL ECHOCARDIOGRAM (TEE);  Surgeon: [name], MD;  Location: [facility] OR;  Service: Open Heart Surgery;  Laterality: N/A;   TONSILLECTOMY     TRANSCATHETER AORTIC VALVE REPLACEMENT, TRANSFEMORAL N/A 03/10/2015   Procedure: TRANSCATHETER AORTIC VALVE REPLACEMENT, TRANSFEMORAL;  Surgeon: [name], MD;  Location: [facility] OR;  Service: Open Heart Surgery;  Laterality: N/A;    reports that he has never smoked. He has never used smokeless tobacco. He reports that he does not drink alcohol and does not use drugs. family history includes Atrial fibrillation in his brother; Crohn's disease in his mother; Heart attack (age of onset: 63) in his father; Heart disease in his father and paternal uncle; Heart failure in his father; Prostate cancer in his paternal uncle; Stroke (age of onset: 45) in his mother. Allergies  Allergen Reactions   Iodine Swelling and Other (See Comments)    Skin swells- CAN DRINK BOOST AND ENSURE WITH NO ISSUES   Amlodipine Swelling and Other (See Comments)    Swelling?   Codeine Other (See Comments)    Reaction not recalled     Review of Systems  Constitutional:  Negative for malaise/fatigue.  Eyes:  Negative for blurred vision.  Respiratory:  Negative for shortness of breath.   Cardiovascular:  Negative for chest pain.  Neurological:  Negative for dizziness, weakness and headaches.      Objective:     BP (!) 132/58 (BP Location: Left Arm, Cuff Size: Normal)   Pulse (!) 47   Temp 98.2 F (36.8 C) (Oral)   Wt 123 lb 12.8 oz (56.2 kg)   SpO2 96%   BMI 23.39 kg/m  BP Readings from Last 3 Encounters:   02/26/24 (!) 132/58  01/10/24 124/60  01/10/24 124/60   Wt Readings from Last 3 Encounters:  02/26/24 123 lb 12.8 oz (56.2 kg)  01/10/24 123 lb (55.8 kg)  01/10/24 123 lb (55.8 kg)      Physical Exam Constitutional:      Appearance: He is well-developed.  HENT:     Right Ear: External ear normal.     Left Ear: External ear normal.  Eyes:     Pupils: Pupils are equal, round, and reactive to light.  Neck:     Thyroid: No thyromegaly.  Cardiovascular:     Rate and Rhythm: Regular rhythm.     Comments: Regular rhythm with rate around 48 Pulmonary:     Effort: Pulmonary effort is normal. No respiratory distress.     Breath sounds: Rales present. No wheezing.     Comments: Chronic bibasilar crackles Musculoskeletal:     Cervical back: Neck supple.     Comments: Trace edema lower legs bilaterally  Skin:  Comments: Some diffuse erythema both hands dorsally.  No warmth.  Nontender.  No vesicles.  Also noted to have significant erythema dorsum of both ears and also occipital scalp.  No blistering.  Neurological:     Mental Status: He is alert and oriented to person, place, and time.      Results for orders placed or performed in visit on 02/26/24  POC HgB A1c  Result Value Ref Range   Hemoglobin A1C 5.8 (A) 4.0 - 5.6 %   HbA1c POC (<> result, manual entry)     HbA1c, POC (prediabetic range)     HbA1c, POC (controlled diabetic range)      Last CBC Lab Results  Component Value Date   WBC 8.3 08/14/2023   HGB 12.8 (L) 08/14/2023   HCT 39.1 08/14/2023   MCV 98.2 08/14/2023   MCH 32.2 08/14/2023   RDW 14.6 08/14/2023   PLT 184 08/14/2023   Last metabolic panel Lab Results  Component Value Date   GLUCOSE 96 02/02/2024   NA 144 02/02/2024   K 4.8 02/02/2024   CL 107 (H) 02/02/2024   CO2 24 02/02/2024   BUN 22 02/02/2024   CREATININE 1.27 02/02/2024   EGFR 56 (L) 02/02/2024   CALCIUM 9.1 02/02/2024   PHOS 4.1 07/09/2023   PROT 6.8 08/11/2023   ALBUMIN 3.3  (L) 08/11/2023   LABGLOB 2.4 06/05/2023   AGRATIO 1.4 10/19/2022   BILITOT 1.3 (H) 08/11/2023   ALKPHOS 97 08/11/2023   AST 34 08/11/2023   ALT 46 (H) 08/11/2023   ANIONGAP 14 08/14/2023   Last lipids Lab Results  Component Value Date   CHOL 135 06/05/2023   HDL 50 06/05/2023   LDLCALC 68 06/05/2023   TRIG 90 06/05/2023   CHOLHDL 2.7 06/05/2023   Last hemoglobin A1c Lab Results  Component Value Date   HGBA1C 5.8 (A) 02/26/2024   Last thyroid functions Lab Results  Component Value Date   TSH 4.650 (H) 10/10/2023   T4TOTAL 8.5 10/19/2009      The ASCVD Risk score (Arnett DK, et al., 2019) failed to calculate for the following reasons:   The 2019 ASCVD risk score is only valid for ages 7 to 75   Risk score cannot be calculated because patient has a medical history suggesting prior/existing ASCVD    Assessment & Plan:   #1 probable photosensitivity rash involving hands, scalp, ears related to doxycycline use recently.  He was out in the sun considerably while on the doxycycline.  Is off antibiotic at this time.  Discussed importance of good skin protection.  Rash should fade gradually over the next several days  #2 history of hyperglycemia.  A1c today 5.8% which is stable  #3 history of heart failure with reduced ejection fraction.  No evidence for volume overload.  He has crackles in both base which are chronic and stable.  Continue multiple heart failure medications as above.   Return in about 6 months (around 08/28/2024).    Evelena Peat, MD

## 2024-02-26 NOTE — Patient Instructions (Addendum)
 Suspect the hand erythema is a sun sensitivity rash related to the Doxycycline  This should continue to clear with time.    A1C today 5.8%.

## 2024-03-04 ENCOUNTER — Ambulatory Visit: Payer: Self-pay

## 2024-03-04 ENCOUNTER — Other Ambulatory Visit: Payer: Self-pay | Admitting: Family Medicine

## 2024-03-04 ENCOUNTER — Other Ambulatory Visit: Payer: Self-pay | Admitting: Cardiovascular Disease

## 2024-03-04 NOTE — Telephone Encounter (Signed)
 Copied from CRM [id number]. Topic: Clinical - Medication Question >> Mar 04, 2024  4:51 PM Denese Killings wrote: Reason for CRM: Patient care giver states that patient has been constant coughing and wants to know if doctor can call in cough medicine (allergy related) to relieve his cough.

## 2024-03-04 NOTE — Telephone Encounter (Signed)
 This RN attempted return call. Patient did not answer. LVM. Routed to office for follow up.

## 2024-03-05 ENCOUNTER — Telehealth: Payer: Self-pay

## 2024-03-05 NOTE — Telephone Encounter (Signed)
 Copied from CRM [id number]. Topic: General - Other >> Mar 05, 2024 10:07 AM Arley Phenix D wrote: Reason for CRM: Patient's caregiver [name] is calling in regards to some allergy's the patient is having. [name] stated that she thinks the pollen is bothering the patient and he has been coughing over the weekend. [name] stated that she has been giving the patient Claritin and he has been feeling better and not coughing as much. [name] wants to know if Dr.[name] can provide the patient with an inhaler and also any allergy medication recommendations.

## 2024-03-06 ENCOUNTER — Telehealth: Payer: Self-pay

## 2024-03-06 MED ORDER — ALBUTEROL SULFATE HFA 108 (90 BASE) MCG/ACT IN AERS: INHALATION_SPRAY | RESPIRATORY_TRACT | 0 refills | Status: DC

## 2024-03-06 NOTE — Telephone Encounter (Signed)
 Left message for the patient to return my call.

## 2024-03-06 NOTE — Telephone Encounter (Signed)
 Noted.

## 2024-03-06 NOTE — Telephone Encounter (Signed)
 Patient informed of message below by Fredrich Romans with [facility]. Rx sent for Albuterol. Please see encounter from 03/06/2024 for additional information.

## 2024-03-06 NOTE — Telephone Encounter (Signed)
 Patient informed of message below by Fredrich Romans with [facility]. Please see most recent encounter

## 2024-03-06 NOTE — Addendum Note (Signed)
 Addended by: Christy Sartorius on: 03/06/2024 01:17 PM   Modules accepted: Orders

## 2024-03-06 NOTE — Telephone Encounter (Signed)
 Copied from CRM [id number]. Topic: General - Other >> Mar 06, 2024  9:13 AM Fredrich Romans wrote: Reason for CRM: Patient returned call to CMA. I relayed message form provider regarding allergy medication.Patient verbalized understanding.

## 2024-03-12 ENCOUNTER — Ambulatory Visit (INDEPENDENT_AMBULATORY_CARE_PROVIDER_SITE_OTHER)

## 2024-03-12 VITALS — BP 122/54 | HR 58

## 2024-03-12 DIAGNOSIS — Z7901 Long term (current) use of anticoagulants: Secondary | ICD-10-CM | POA: Diagnosis not present

## 2024-03-12 LAB — POCT INR: INR: 1.9 — AB (ref 2.0–3.0)

## 2024-03-12 NOTE — Patient Instructions (Addendum)
 Pre visit review using our clinic review tool, if applicable. No additional management support is needed unless otherwise documented below in the visit note.  Increase dose today to take 1 tablet and then continue 1/2 tablet daily except take 1 tablet on Monday, Wednesday, and Friday. Recheck in 3 weeks.

## 2024-03-12 NOTE — Progress Notes (Cosign Needed Addendum)
 Missed dose 2 or 3 days ago.  Increase dose today to take 1 tablet and then continue 1/2 tablet daily except take 1 tablet on Monday, Wednesday, and Friday. Recheck in 3 weeks.  Pt requested BP check:  BP: 122/54 HR: 58 O2sat: 96% These are all normal readings for this pt.  Medical screening examination/treatment/procedure(s) were performed by non-physician practitioner and as supervising physician I was immediately available for consultation/collaboration.  I agree with above. Jacinta Shoe, MD

## 2024-04-02 ENCOUNTER — Ambulatory Visit (INDEPENDENT_AMBULATORY_CARE_PROVIDER_SITE_OTHER)

## 2024-04-02 DIAGNOSIS — Z7901 Long term (current) use of anticoagulants: Secondary | ICD-10-CM | POA: Diagnosis not present

## 2024-04-02 LAB — POCT INR: INR: 1.7 — AB (ref 2.0–3.0)

## 2024-04-02 NOTE — Patient Instructions (Addendum)
Pre visit review using our clinic review tool, if applicable. No additional management support is needed unless otherwise documented below in the visit note.  Increase dose today to take 1 tablet and then change weekly dose to take 1 tablet daily except take 1/2 tablet on Tuesday, Thursday and Saturday.  Recheck in 3 weeks.

## 2024-04-02 NOTE — Progress Notes (Signed)
 Pt missed one dose 3 days ago and another last week. Educated pt on importance of taking medication as prescribed and not missing any doses. Pt verbalized understanding.  Increase dose today to take 1 tablet and then change weekly dose to take 1 tablet daily except take 1/2 tablet on Tuesday, Thursday and Saturday. Recheck in 3 weeks.

## 2024-04-08 ENCOUNTER — Ambulatory Visit: Payer: Medicare Other | Attending: Cardiovascular Disease | Admitting: Cardiovascular Disease

## 2024-04-08 ENCOUNTER — Encounter: Payer: Self-pay | Admitting: Cardiovascular Disease

## 2024-04-08 VITALS — BP 134/52 | HR 51 | Ht 62.0 in | Wt 129.4 lb

## 2024-04-08 DIAGNOSIS — R0989 Other specified symptoms and signs involving the circulatory and respiratory systems: Secondary | ICD-10-CM

## 2024-04-08 DIAGNOSIS — I472 Ventricular tachycardia, unspecified: Secondary | ICD-10-CM

## 2024-04-08 DIAGNOSIS — I2510 Atherosclerotic heart disease of native coronary artery without angina pectoris: Secondary | ICD-10-CM

## 2024-04-08 DIAGNOSIS — I5022 Chronic systolic (congestive) heart failure: Secondary | ICD-10-CM | POA: Diagnosis not present

## 2024-04-08 DIAGNOSIS — Z952 Presence of prosthetic heart valve: Secondary | ICD-10-CM | POA: Diagnosis not present

## 2024-04-08 DIAGNOSIS — R0602 Shortness of breath: Secondary | ICD-10-CM | POA: Diagnosis not present

## 2024-04-08 DIAGNOSIS — I4819 Other persistent atrial fibrillation: Secondary | ICD-10-CM | POA: Diagnosis not present

## 2024-04-08 MED ORDER — METOPROLOL SUCCINATE ER 25 MG PO TB24: 25.0000 mg | ORAL_TABLET | Freq: Every day | ORAL | 3 refills | Status: AC

## 2024-04-08 MED ORDER — FUROSEMIDE 20 MG PO TABS: 20.0000 mg | ORAL_TABLET | Freq: Every day | ORAL | 3 refills | Status: AC

## 2024-04-08 NOTE — Assessment & Plan Note (Signed)
 No recurrent angina.  Continue aspirin  and metoprolol  succinate.  Continue atorvastatin 

## 2024-04-08 NOTE — Assessment & Plan Note (Signed)
 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  Continue amoxicillin  as indicated for dental procedures.  Most recent echo reviewed as above with a mean transvalvular gradient of 10 mmHg and mild to moderate paravalvular regurgitation.

## 2024-04-08 NOTE — Assessment & Plan Note (Signed)
 The patient appears euvolemic.  Continue furosemide , Jardiance , lisinopril , and metoprolol  succinate.  At his advanced age he does not tolerate aggressive GDMT.

## 2024-04-08 NOTE — Progress Notes (Signed)
 Cardiology Office Note:    Date:  04/08/2024   ID:  Jesus [name], DOB 1941-09-11, MRN [id number]  PCP:  Marquetta Sit, MD   [facility] Providers Cardiologist:  Arnoldo Lapping, MD Electrophysiologist:  Richardo Chandler, MD     Referring MD: Marquetta Sit, MD   Chief Complaint  Patient presents with   Atrial Fibrillation    History of Present Illness:    Jesus [name] is a 83 y.o. 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 cardiomyopathy TTE 10/10/2023: EF 30, severe inferior/inferoseptal/inferolateral HK/AK, low normal RVSF, mild RVE, severe LAE, moderate RAE, mild MR, s/p TAVR with mean gradient 10, mild-moderate AI, moderate PI Coronary artery disease  Hx of remote Ant MI in 1980 s/p CABG in 1997 NSTEMI 02/2022 s/p 3.5 x 12 mm DES to S-PDA  [facility] 02/17/2022: 6/6 grafts patent, SVG-PDA 80, RPAV 100, OM 380 Persistent Atrial fibrillation  Coumadin  Rx Severe aortic stenosis s/p TAVR Mod paravalvular AI Ventricular tachycardia   Hypertension  Hyperlipidemia   DNR (Do Not Resuscitate)    The patient is here with his caregiver today.  He states that he feels "super."  He is frequently getting out in his yard and doing some light work.  He has become pretty unsteady on his feet so they do not allow him to mow the grass on a riding mower anymore.  He denies chest pain or chest pressure.  He denies heart palpitations.  He was having some leg swelling but they increased his furosemide  from 20 mg 3 days a week to 20 mg daily.  His symptoms have improved significantly since then.  He has no orthopnea, PND, lightheadedness, or syncope.  He admits to mild exertional dyspnea.  Current Medications: Current Meds  Medication Sig   amiodarone  (PACERONE ) 200 MG tablet Take 1 tablet (200 mg total) by mouth daily.   amoxicillin  (AMOXIL ) 500 MG capsule TAKE FOUR CAPSULES BY MOUTH ONE hour prior TO dental procedure   Ascorbic Acid   (VITAMIN C) 1000 MG tablet Take 1,000 mg by mouth daily.   aspirin  EC 81 MG tablet Take 1 tablet (81 mg total) by mouth daily. Swallow whole.   atorvastatin  (LIPITOR ) 80 MG tablet Take 1 tablet (80 mg total) by mouth daily.   empagliflozin  (JARDIANCE ) 10 MG TABS tablet TAKE 1 TABLET BY MOUTH EVERY DAY   ezetimibe  (ZETIA ) 10 MG tablet TAKE ONE TABLET BY MOUTH DAILY   furosemide  (LASIX ) 20 MG tablet Take 1 tablet (20 mg total) by mouth every Monday, Wednesday, and Friday. (Patient taking differently: Take 20 mg by mouth every Monday, Wednesday, and Friday. Taking on M,W,F, and S)   guaiFENesin -dextromethorphan  (ROBITUSSIN DM) 100-10 MG/5ML syrup Take 10 mLs by mouth every 4 (four) hours as needed for cough.   lisinopril  (ZESTRIL ) 2.5 MG tablet Take 1 tablet (2.5 mg total) by mouth daily.   metoprolol  tartrate (LOPRESSOR ) 25 MG tablet Take 12.5 mg by mouth 2 (two) times daily.   Multiple Vitamins-Minerals (MULTIVITAMIN,TX-MINERALS) tablet Take 1 tablet by mouth daily with breakfast.   nitroGLYCERIN  (NITROSTAT ) 0.4 MG SL tablet Place 1 tablet (0.4 mg total) under the tongue every 5 (five) minutes x 3 doses as needed for chest pain.   warfarin (COUMADIN ) 4 MG tablet TAKE 1/2 TABLET BY MOUTH DAILY EXCEPT TAKE 1 TABLET ON MONDAYS, WEDNESDAYS AND FRIDAYS OR AS DIRECTED BY ANTICOAGULATION CLINIC   [DISCONTINUED] acetaminophen  (TYLENOL ) 325 MG tablet Take 1-2 tablets (325-650  mg total) by mouth every 4 (four) hours as needed for mild pain.   [DISCONTINUED] albuterol  (VENTOLIN  HFA) 108 (90 Base) MCG/ACT inhaler Inhale 2 puffs every 4-6 hours as needed for cough and wheeze but use only if indicated for wheezing.   [DISCONTINUED] pantoprazole  (PROTONIX ) 40 MG tablet Take 1 tablet (40 mg total) by mouth daily. (Patient taking differently: Take 40 mg by mouth as needed.)     Allergies:   Iodine , Amlodipine , and Codeine   ROS:   Please see the history of present illness.    All other systems reviewed and are  negative.  EKGs/Labs/Other Studies Reviewed:    The following studies were reviewed today: Cardiac Studies & Procedures   ______________________________________________________________________________________________ CARDIAC CATHETERIZATION  CARDIAC CATHETERIZATION 02/17/2022  Conclusion   Ost RCA to Prox RCA lesion is 100% stenosed.   Prox Graft lesion before RV Branch  is 80% stenosed.   RPAV lesion is 100% stenosed.   Ost LM to Mid LM lesion is 100% stenosed.   3rd Mrg lesion is 80% stenosed.   A drug-eluting stent was successfully placed using a STENT ONYX FRONTIER 3.5X12.   Post intervention, there is a 10% residual stenosis.   SVG graft was visualized by angiography.   SVG graft was visualized by angiography.   LIMA graft was visualized by angiography.  Severe native vessel CAD s/p 6V CABG with 6/6 patent grafts. 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 There is a severe stenosis in the proximal body of the SVG to the PDA. Successful PTCA/DES x 1 proximal body of SVG to PDA using a distal protection device.  Recommendations: Continue ASA/Plavix  until he is therapeutic on coumadin  and can then consider stopping ASA while continuing Plavix  with coumadin .  Findings Coronary Findings Diagnostic  Dominance: Right  Left Main Ost LM to Mid LM lesion is 100% stenosed. The lesion is chronically occluded.  Left Anterior Descending  Second Septal Branch Collaterals 2nd Sept filled by collaterals from 2nd RPL.  Left Circumflex  Third Obtuse Marginal Branch 3rd Mrg lesion is 80% stenosed.  Right Coronary Artery Vessel is large. Ost RCA to Prox RCA lesion is 100% stenosed. The lesion is chronically occluded.  Right  Posterior Atrioventricular Artery RPAV lesion is 100% stenosed. The lesion is chronically occluded.  Sequential Saphenous Graft To RV Branch, Dist RCA SVG graft was visualized by angiography. Prox Graft lesion before RV Branch  is 80% stenosed.  Sequential Saphenous Graft To 1st Mrg, 2nd Mrg, 3rd Mrg SVG graft was visualized by angiography.  LIMA LIMA Graft To Mid LAD LIMA graft was visualized by angiography.  Intervention  Prox Graft lesion before RV Branch (Sequential Saphenous Graft To RV Branch, Dist RCA) Stent CATHETER LAUNCHER 6FR AL1 guide catheter was inserted. Lesion crossed with guidewire using a DEVICE SPIDERFX EMB PROT . Pre-stent angioplasty was not performed. A drug-eluting stent was successfully placed using a STENT ONYX FRONTIER 3.5X12. Stent strut is well apposed. Post-stent angioplasty was not performed. Post-Intervention Lesion Assessment The intervention was successful. Pre-interventional TIMI flow is 3. Post-intervention TIMI flow is 3. No complications occurred at this lesion. There is a 10% residual stenosis post intervention.     ECHOCARDIOGRAM  ECHOCARDIOGRAM COMPLETE  10/10/2023  Narrative ECHOCARDIOGRAM REPORT    Patient Name:   DEQWAN EINCK Date of Exam: 10/10/2023 Medical Rec #:  [id number]        Height:       61.0 in Accession #:    [id number]       Weight:       118.2 lb Date of Birth:  [birth year]         BSA:          1.510 m Patient Age:    82 years         BP:           120/60 mmHg Patient Gender: M                HR:           55 bpm. Exam Location:  [address]  Procedure: 2D Echo, 3D Echo, Cardiac Doppler and Color Doppler  Indications:    Z95.2 Status Post TAVR  History:        Patient has prior history of Echocardiogram examinations, most recent 08/12/2023. CHF, CAD and Previous Myocardial Infarction, Prior CABG, Prior Cardiac Surgery and Abnormal ECG, Arrythmias:Atrial Fibrillation; Risk Factors:Family History of Coronary  Artery Disease, Hypertension and Dyslipidemia. Aortic Stenosis status post TAVR (2016, 26mm Edwards S3, with CABG), Prior EF <20%.  Sonographer:    Ewing Holiday RDCS Referring Phys: Marquetta Sit  IMPRESSIONS   1. LVEF is depressed with severe hypokinesis/akinesis of the inferior, inferoseptal, inferolateral walls; hypokinesis elsewhere. Very mildlly improved from echo in September 2024. Left ventricular ejection fraction, by estimation, is 30%. The left ventricle has moderately decreased function. The left ventricular internal cavity size was mildly dilated. 2. Right ventricular systolic function is low normal. The right ventricular size is mildly enlarged. 3. Left atrial size was severely dilated. 4. Right atrial size was moderately dilated. 5. Mild mitral valve regurgitation. 6. S/p AVR (26 mm Edwards S3, 2016). Leaflets open well. Mean gradient through the valve is 10 mm Hg Dimensionless index is 0.38 . The aortic valve has been repaired/replaced. Aortic valve regurgitation is mild to moderate. 7. Pulmonic valve regurgitation is moderate.  Comparison(s): The left ventricular function is unchanged.  FINDINGS Left Ventricle: LVEF is depressed with severe hypokinesis/akinesis of the inferior, inferoseptal, inferolateral walls; hypokinesis elsewhere. Very mildlly improved from echo in September 2024. Left ventricular ejection fraction, by estimation, is 30%. The left ventricle has moderately decreased function. The left ventricular internal cavity size was mildly dilated. There is no left ventricular hypertrophy.  Right Ventricle: The right ventricular size is mildly enlarged. Right vetricular wall thickness was not assessed. Right ventricular systolic function is low normal.  Left Atrium: Left atrial size was severely dilated.  Right Atrium: Right atrial size was moderately dilated.  Pericardium: There is no evidence of pericardial effusion.  Mitral Valve: There is mild  thickening of the mitral valve leaflet(s). Mild mitral annular calcification. Mild mitral valve regurgitation.  Tricuspid Valve: The tricuspid valve is normal in structure. Tricuspid valve regurgitation is trivial.  Aortic Valve: S/p AVR (26 mm Edwards S3, 2016). Leaflets open well. Mean gradient through the valve is 10 mm Hg Dimensionless index is 0.38. The aortic valve has been repaired/replaced. Aortic valve regurgitation is mild to moderate. Aortic regurgitation PHT measures 495 msec. Aortic valve mean gradient measures 10.0 mmHg. Aortic valve peak gradient measures 18.6 mmHg. Aortic valve area, by VTI measures 1.31 cm.  Pulmonic Valve: The pulmonic valve was grossly normal. Pulmonic  valve regurgitation is moderate.  Aorta: The aortic root and ascending aorta are structurally normal, with no evidence of dilitation.  IAS/Shunts: No atrial level shunt detected by color flow Doppler.   LEFT VENTRICLE PLAX 2D LVIDd:         5.40 cm   Diastology LVIDs:         4.63 cm   LV e' medial:    4.68 cm/s LV PW:         0.60 cm   LV E/e' medial:  20.2 LV IVS:        0.65 cm   LV e' lateral:   3.59 cm/s LVOT diam:     2.10 cm   LV E/e' lateral: 26.3 LV SV:         67 LV SV Index:   44 LVOT Area:     3.46 cm  3D Volume EF: 3D EF:        37 % LV EDV:       321 ml LV ESV:       201 ml LV SV:        120 ml  RIGHT VENTRICLE RV Basal diam:  4.20 cm RV Mid diam:    3.20 cm RV S prime:     11.20 cm/s TAPSE (M-mode): 2.1 cm  LEFT ATRIUM              Index        RIGHT ATRIUM           Index LA diam:        5.00 cm  3.31 cm/m   RA Pressure: 3.00 mmHg LA Vol (A2C):   133.0 ml 88.05 ml/m  RA Area:     18.60 cm LA Vol (A4C):   112.0 ml 74.15 ml/m  RA Volume:   51.60 ml  34.16 ml/m LA Biplane Vol: 123.0 ml 81.43 ml/m AORTIC VALVE AV Area (Vmax):    1.34 cm AV Area (Vmean):   1.28 cm AV Area (VTI):     1.31 cm AV Vmax:           215.50 cm/s AV Vmean:          145.500 cm/s AV VTI:             0.510 m AV Peak Grad:      18.6 mmHg AV Mean Grad:      10.0 mmHg LVOT Vmax:         83.55 cm/s LVOT Vmean:        53.800 cm/s LVOT VTI:          0.192 m LVOT/AV VTI ratio: 0.38 AI PHT:            495 msec  AORTA Ao Root diam: 2.60 cm Ao Asc diam:  3.00 cm  MITRAL VALVE               TRICUSPID VALVE MV Area (PHT)  cm         Estimated RAP:  3.00 mmHg MV Decel Time: 247 msec MV E velocity: 94.55 cm/s  SHUNTS MV A velocity: 88.55 cm/s  Systemic VTI:  0.19 m MV E/A ratio:  1.07        Systemic Diam: 2.10 cm  Ola Berger MD Electronically signed by Ola Berger MD Signature Date/Time: 10/10/2023/1:22:32 PM    Final    MONITORS  LONG TERM MONITOR (3-14 DAYS) 01/25/2022  Narrative Patch Wear Time:  2 days and 17 hours (  2023-02-09T21:17:52-0500 to 2023-02-12T14:54:06-499)  Patient had a min HR of 47 bpm, max HR of 119 bpm, and avg HR of 65 bpm. Predominant underlying rhythm was Sinus Rhythm. 1 run of Supraventricular Tachycardia occurred lasting 4 beats with a max rate of 119 bpm (avg 98 bpm). Isolated SVEs were occasional (4.2%, 10845), SVE Couplets were rare (<1.0%, 76), and SVE Triplets were rare (<1.0%, 10). Isolated VEs were occasional (2.2%, 5676), VE Couplets were rare (<1.0%, 677), and VE Triplets were rare (<1.0%, 22). Ventricular Bigeminy and Trigeminy were present.  SUMMARY: 1. The basic rhythm is normal sinus with an average HR of 65 bpm 2. No atrial fibrillation or flutter 3. No high-grade heart block or pathologic pauses 4. There are occasional PVC's and occasional supraventricular beats without sustained arrhythmias  RECOMMEND: Reassurance, no serious arrhythmias identified, continue current Rx   CT SCANS  CT CORONARY MORPH W/CTA COR W/SCORE 02/13/2015  Addendum 02/16/2015  8:20 AM ADDENDUM REPORT: 02/16/2015 08:18  EXAM: OVER-READ INTERPRETATION  CT CHEST  The following report is an over-read performed by radiologist Dr. Pearlean Botts  [facility] on 02/16/2015. This over-read does not include interpretation of cardiac or coronary anatomy or pathology. The cardiac CTA interpretation by the cardiologist is attached.  COMPARISON:  CT 02/13/2015  FINDINGS: Mild basilar atelectasis. Suspicious pulmonary nodules. Tiny subpleural nodule in the left lower lobe measures 3 mm (image 45 series 414) is likely benign. No axillary adenopathy aggressive osseous lesion.  IMPRESSION: Tiny subpleural nodule in left lower lobe is likely benign.   Electronically Signed By: Deboraha Fallow M.D. On: 02/16/2015 08:18  Narrative MEDICATIONS: None  CLINICAL DATA:  Aortic Stenosis  EXAM: Cardiac TAVR CT  TECHNIQUE: The patient was scanned on a Philips 256 scanner. A 120 kV retrospective scan was triggered in the descending thoracic aorta at 111 HU's. Gantry rotation speed was 270 msecs and collimation was .9 mm. No beta blockade or nitro were given. The 3D data set was reconstructed in 5% intervals of the R-R cycle. Systolic and diastolic phases were analyzed on a dedicated work station using MPR, MIP and VRT modes. The patient received 80 cc of contrast.  FINDINGS: Aortic Valve: Trileaflet heavily calcified. Mild calcification of the base of the left cusp at the annulus. No significant sino tubular junction calcification.  Aorta: No aneurysm or significant plaque. Normal origin of great vessels Mild calcification of the inferior surface of the arch  Sinotubular Junction:  23.8 mm  Ascending Thoracic Aorta:  30 mm  Aortic Arch:  25 mm  Descending Thoracic Aorta:  22 mm  Sinus of Valsalva Measurements:  Non-coronary:  31 mm  Right -coronary:  30 mm  Left -coronary:  31mm  Coronary Artery Height above Annulus:  Left Main:  13.7 mm  Right Coronary:  17 mm  Virtual Basal Annulus Measurements:  Maximum/Minimum Diameter:  22.2 mm x 29.9 mm  Area:  516 mm2  Coronary Arteries: Native vessels  occluded. Patient LIMA to LAD/D1, Patent SVG to OM1, OM2 sequential Patent SVG to AM/PDA  Optimum Fluoroscopic Angle for Delivery: RAO 1 degree Cranial 0 degrees  IMPRESSION: 1) Calcified trileaflet aortic valve suitable for a 26 mm Sapien 3 valve Area 516 mm2  2) Suitable coronary height above annulus for delivery  3) Patient SVG to PDA, Sequential to OM's and LIMA to LAD  4) Normal ascending aortic root size with no contraindications in Arch for TAVR  5) Optimum delivery angle essentially straight AP  Janelle Mediate  Electronically Signed: By: Janelle Mediate M.D. On: 02/13/2015 12:22     ______________________________________________________________________________________________      EKG:        Recent Labs: 08/11/2023: ALT 46; B Natriuretic Peptide 3,549.0 08/14/2023: Hemoglobin 12.8; Magnesium  2.2; Platelets 184 08/22/2023: NT-Pro BNP 10,205 10/10/2023: TSH 4.650 02/02/2024: BUN 22; Creatinine, Ser 1.27; Potassium 4.8; Sodium 144  Recent Lipid Panel    Component Value Date/Time   CHOL 135 06/05/2023 0928   TRIG 90 06/05/2023 0928   TRIG 67 11/20/2006 0748   HDL 50 06/05/2023 0928   CHOLHDL 2.7 06/05/2023 0928   CHOLHDL 3 09/01/2022 0726   VLDL 16.6 09/01/2022 0726   LDLCALC 68 06/05/2023 0928     Risk Assessment/Calculations:    CHA2DS2-VASc Score = 5   This indicates a 7.2% annual risk of stroke. The patient's score is based upon: CHF History: 1 HTN History: 1 Diabetes History: 0 Stroke History: 0 Vascular Disease History: 1 Age Score: 2 Gender Score: 0               Physical Exam:    VS:  BP (!) 134/52   Pulse (!) 51   Ht 5\' 2"  (1.575 m)   Wt 129 lb 6.4 oz (58.7 kg)   SpO2 96%   BMI 23.67 kg/m     Wt Readings from Last 3 Encounters:  04/08/24 129 lb 6.4 oz (58.7 kg)  02/26/24 123 lb 12.8 oz (56.2 kg)  01/10/24 123 lb (55.8 kg)     GEN:  Well nourished, well developed in no acute distress HEENT: Normal NECK: No JVD; No carotid  bruits LYMPHATICS: No lymphadenopathy CARDIAC: RRR, 2/6 systolic ejection murmur at the right upper sternal border with a 2/6 diastolic decrescendo murmur RESPIRATORY: Diffuse inspiratory crackles are noted ABDOMEN: Soft, non-tender, non-distended MUSCULOSKELETAL: Trace bilateral pretibial edema; No deformity  SKIN: Warm and dry NEUROLOGIC:  Alert and oriented x 3 PSYCHIATRIC:  Normal affect   Assessment & Plan Chronic HFrEF (heart failure with reduced ejection fraction) (HCC) The patient appears euvolemic.  Continue furosemide , Jardiance , lisinopril , and metoprolol  succinate.  At his advanced age he does not tolerate aggressive GDMT. Persistent atrial fibrillation (HCC) Maintaining sinus rhythm on amiodarone  Coronary artery disease involving native coronary artery of native heart without angina pectoris No recurrent angina.  Continue aspirin  and metoprolol  succinate.  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 Rodolfo Clan.  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  Continue amoxicillin  as indicated for dental procedures.  Most recent echo reviewed as above with a mean transvalvular gradient of 10 mmHg and mild to moderate paravalvular regurgitation. Shortness of breath See below, appears euvolemic, check noncontrast chest CT. Abnormal lung sounds Patient with mild shortness of breath and diffuse inspiratory crackles.  He had a previous chest CT last year that I reviewed.  I think he should have a repeat study since he is on chronic amiodarone  just to make sure he is not developing any progressive interstitial changes.            Medication Adjustments/Labs and Tests Ordered: Current medicines are  reviewed at length with the patient today.  Concerns regarding medicines are outlined above.  No orders of the defined types were placed in this encounter.  No orders of the defined types were placed in  this encounter.   Patient Instructions  Testing/Procedures: Non-Contrast CT chest Non-Cardiac CT scanning, (CAT scanning), is a noninvasive, special x-ray that produces cross-sectional images of the body using x-rays and a computer. CT scans help physicians diagnose and treat medical conditions. For some CT exams, a contrast material is used to enhance visibility in the area of the body being studied. CT scans provide greater clarity and reveal more details than regular x-ray exams.  Follow-Up: At [facility], you and your health needs are our priority.  As part of our continuing mission to provide you with exceptional heart care, our providers are all part of one team.  This team includes your primary Cardiologist (physician) and Advanced Practice Providers or APPs (Physician Assistants and Nurse Practitioners) who all work together to provide you with the care you need, when you need it.  Your next appointment:   6 month(s)  Provider:   One of our Advanced Practice Providers (APPs): Melita Springer, PA-C  Friddie Jetty, NP Evaline Hill, NP  Theotis Flake, PA-C Lawana Pray, NP  Willis Harter, PA-C Lovette Rud, PA-C  [place], PA-C Ernest Dick, NP  Marlana Silvan, NP Marcie Sever, PA-C  Laquita Plant, PA-C    Dayna Dunn, PA-C  Scott Weaver, PA-C Palmer Bobo, NP Katlyn West, NP Callie Goodrich, PA-C  Evan Williams, PA-C Sheng Haley, PA-C  Xika Zhao, NP Kathleen Johnson, PA-C   Then, Arnoldo Lapping, MD will plan to see you again in 1 year(s).       Signed, Arnoldo Lapping, MD  04/08/2024 11:04 AM    [facility]

## 2024-04-08 NOTE — Patient Instructions (Signed)
 Testing/Procedures: Non-Contrast CT chest Non-Cardiac CT scanning, (CAT scanning), is a noninvasive, special x-ray that produces cross-sectional images of the body using x-rays and a computer. CT scans help physicians diagnose and treat medical conditions. For some CT exams, a contrast material is used to enhance visibility in the area of the body being studied. CT scans provide greater clarity and reveal more details than regular x-ray exams.  Follow-Up: At [facility], you and your health needs are our priority.  As part of our continuing mission to provide you with exceptional heart care, our providers are all part of one team.  This team includes your primary Cardiologist (physician) and Advanced Practice Providers or APPs (Physician Assistants and Nurse Practitioners) who all work together to provide you with the care you need, when you need it.  Your next appointment:   6 month(s)  Provider:   One of our Advanced Practice Providers (APPs): Melita Springer, PA-C  Friddie Jetty, NP Evaline Hill, NP  Theotis Flake, PA-C Lawana Pray, NP  Willis Harter, PA-C Lovette Rud, PA-C  [place], PA-C Ernest Dick, NP  Marlana Silvan, NP Marcie Sever, PA-C  Laquita Plant, PA-C    Dayna Dunn, PA-C  Scott Weaver, PA-C Palmer Bobo, NP Katlyn West, NP Callie Goodrich, PA-C  Evan Williams, PA-C Sheng Haley, PA-C  Xika Zhao, NP Kathleen Johnson, PA-C   Then, Arnoldo Lapping, MD will plan to see you again in 1 year(s).

## 2024-04-08 NOTE — Assessment & Plan Note (Signed)
Maintaining sinus rhythm on amiodarone 

## 2024-04-08 NOTE — Assessment & Plan Note (Signed)
 Followed by Dr. Rodolfo Clan.  Appears stable on low-dose amiodarone 

## 2024-04-19 ENCOUNTER — Ambulatory Visit (HOSPITAL_COMMUNITY)
Admission: RE | Admit: 2024-04-19 | Discharge: 2024-04-19 | Disposition: A | Discharge status: Home / Self Care | Source: Ambulatory Visit | Attending: Cardiovascular Disease | Admitting: Cardiovascular Disease

## 2024-04-19 DIAGNOSIS — I700 Atherosclerosis of aorta: Secondary | ICD-10-CM | POA: Diagnosis not present

## 2024-04-19 DIAGNOSIS — R0602 Shortness of breath: Secondary | ICD-10-CM | POA: Diagnosis not present

## 2024-04-19 DIAGNOSIS — I5022 Chronic systolic (congestive) heart failure: Secondary | ICD-10-CM | POA: Diagnosis not present

## 2024-04-22 ENCOUNTER — Ambulatory Visit: Payer: Self-pay | Admitting: Cardiovascular Disease

## 2024-04-23 ENCOUNTER — Ambulatory Visit (INDEPENDENT_AMBULATORY_CARE_PROVIDER_SITE_OTHER)

## 2024-04-23 DIAGNOSIS — Z7901 Long term (current) use of anticoagulants: Secondary | ICD-10-CM | POA: Diagnosis not present

## 2024-04-23 LAB — POCT INR: INR: 3.2 — AB (ref 2.0–3.0)

## 2024-04-23 NOTE — Patient Instructions (Addendum)
 Pre visit review using our clinic review tool, if applicable. No additional management support is needed unless otherwise documented below in the visit note.  Decrease dose today to take 1/2 tablet and then continue 1 tablet daily except take 1/2 tablet on Tuesday, Thursday and Saturday. Recheck in 2 weeks.

## 2024-04-23 NOTE — Progress Notes (Addendum)
 Pt took cough medication, unsure of type, about 2 days ago. Pt already took warfarin today. Decrease dose today to take 1/2 tablet and then continue 1 tablet daily except take 1/2 tablet on Tuesday, Thursday and Saturday. Recheck in 2 weeks.   Pt always requests BP and HR check. The readings today are normal for this pt.  BP 118/50 HR  46 O2 sat 98%   Medical screening examination/treatment/procedure(s) were performed by non-physician practitioner and as supervising physician I was immediately available for consultation/collaboration.  I agree with above. Adelaide Holy, MD

## 2024-04-25 DIAGNOSIS — H903 Sensorineural hearing loss, bilateral: Secondary | ICD-10-CM | POA: Diagnosis not present

## 2024-05-07 ENCOUNTER — Ambulatory Visit (INDEPENDENT_AMBULATORY_CARE_PROVIDER_SITE_OTHER)

## 2024-05-07 DIAGNOSIS — Z7901 Long term (current) use of anticoagulants: Secondary | ICD-10-CM

## 2024-05-07 LAB — POCT INR: INR: 3.6 — AB (ref 2.0–3.0)

## 2024-05-07 NOTE — Progress Notes (Signed)
 Pt already took warfarin today. Hold dose tomorrow and then change weekly dose to take 1/2 tablet daily except take 1 tablet on Monday, Wednesday and Friday. Recheck in 2 weeks.

## 2024-05-07 NOTE — Patient Instructions (Addendum)
 Pre visit review using our clinic review tool, if applicable. No additional management support is needed unless otherwise documented below in the visit note.  Hold dose tomorrow and then change weekly dose to take 1/2 tablet daily except take 1 tablet on Monday, Wednesday and Friday. Recheck in 2 weeks.

## 2024-05-21 ENCOUNTER — Ambulatory Visit (INDEPENDENT_AMBULATORY_CARE_PROVIDER_SITE_OTHER)

## 2024-05-21 DIAGNOSIS — Z7901 Long term (current) use of anticoagulants: Secondary | ICD-10-CM | POA: Diagnosis not present

## 2024-05-21 LAB — POCT INR: INR: 2 (ref 2.0–3.0)

## 2024-05-21 NOTE — Patient Instructions (Addendum)
 Pre visit review using our clinic review tool, if applicable. No additional management support is needed unless otherwise documented below in the visit note.  Continue 1/2 tablet daily except take 1 tablet on Monday, Wednesday and Friday. Recheck in 2 weeks.

## 2024-05-21 NOTE — Progress Notes (Signed)
 Continue 1/2 tablet daily except take 1 tablet on Monday, Wednesday and Friday. Recheck in 2 weeks.

## 2024-06-04 ENCOUNTER — Ambulatory Visit

## 2024-06-04 VITALS — BP 128/58 | HR 47

## 2024-06-04 DIAGNOSIS — Z7901 Long term (current) use of anticoagulants: Secondary | ICD-10-CM

## 2024-06-04 LAB — POCT INR: INR: 2.4 (ref 2.0–3.0)

## 2024-06-04 NOTE — Patient Instructions (Addendum)
 Pre visit review using our clinic review tool, if applicable. No additional management support is needed unless otherwise documented below in the visit note.  Continue 1/2 tablet daily except take 1 tablet on Monday, Wednesday and Friday. Recheck in 4 weeks.

## 2024-06-04 NOTE — Progress Notes (Signed)
 Continue 1/2 tablet daily except take 1 tablet on Monday, Wednesday and Friday. Recheck in 4 weeks.   Pt requested BP check. Pt reports taking all BP meds as prescribed.  BP 128/58 HR 47 O2sat 97%  All of these readings are normal for the pt.

## 2024-06-18 ENCOUNTER — Ambulatory Visit

## 2024-06-20 ENCOUNTER — Other Ambulatory Visit: Payer: Self-pay | Admitting: Internal Medicine

## 2024-06-29 DIAGNOSIS — R059 Cough, unspecified: Secondary | ICD-10-CM | POA: Diagnosis not present

## 2024-06-29 DIAGNOSIS — R0981 Nasal congestion: Secondary | ICD-10-CM | POA: Diagnosis not present

## 2024-06-30 ENCOUNTER — Other Ambulatory Visit: Payer: Self-pay | Admitting: Family Medicine

## 2024-06-30 DIAGNOSIS — Z7901 Long term (current) use of anticoagulants: Secondary | ICD-10-CM

## 2024-07-01 NOTE — Telephone Encounter (Signed)
 Pt is compliant with warfarin management and PCP apts.  Sent in refill of warfarin to requested pharmacy.

## 2024-07-02 ENCOUNTER — Ambulatory Visit (INDEPENDENT_AMBULATORY_CARE_PROVIDER_SITE_OTHER)

## 2024-07-02 DIAGNOSIS — Z7901 Long term (current) use of anticoagulants: Secondary | ICD-10-CM

## 2024-07-02 LAB — POCT INR: INR: 1.9 — AB (ref 2.0–3.0)

## 2024-07-02 NOTE — Progress Notes (Signed)
 Increase dose today to take 1 tablet and then continue 1/2 tablet daily except take 1 tablet on Monday, Wednesday and Friday. Recheck in 3 weeks.

## 2024-07-02 NOTE — Patient Instructions (Addendum)
 Pre visit review using our clinic review tool, if applicable. No additional management support is needed unless otherwise documented below in the visit note.  Increase dose today to take 1 tablet and then continue 1/2 tablet daily except take 1 tablet on Monday, Wednesday and Friday. Recheck in 3 weeks.

## 2024-07-23 ENCOUNTER — Ambulatory Visit

## 2024-07-23 DIAGNOSIS — Z7901 Long term (current) use of anticoagulants: Secondary | ICD-10-CM

## 2024-07-23 LAB — POCT INR: INR: 1.8 — AB (ref 2.0–3.0)

## 2024-07-23 NOTE — Patient Instructions (Addendum)
Pre visit review using our clinic review tool, if applicable. No additional management support is needed unless otherwise documented below in the visit note.  Increase dose today to take 1 tablet and then change weekly dose to take 1 tablet daily except take 1/2 tablet on Tuesday, Thursday and Saturday.  Recheck in 3 weeks.

## 2024-07-23 NOTE — Progress Notes (Signed)
 Increase dose today to take 1 tablet and then change weekly dose to take 1 tablet daily except take 1/2 tablet on Tuesday, Thursday and Saturday. Recheck in 3 weeks.

## 2024-08-03 ENCOUNTER — Other Ambulatory Visit: Payer: Self-pay | Admitting: Family Medicine

## 2024-08-07 ENCOUNTER — Ambulatory Visit: Payer: Medicare Other

## 2024-08-07 VITALS — Ht 60.0 in | Wt 120.0 lb

## 2024-08-07 DIAGNOSIS — Z0000 Encounter for general adult medical examination without abnormal findings: Secondary | ICD-10-CM

## 2024-08-07 NOTE — Progress Notes (Signed)
 Subjective:   Jesus [name] is a 83 y.o. who presents for a Medicare Wellness preventive visit.  As a reminder, Annual Wellness Visits don't include a physical exam, and some assessments may be limited, especially if this visit is performed virtually. We may recommend an in-person follow-up visit with your provider if needed.  Visit Complete: Virtual I connected with  Alm LITTIE Shams on 08/07/24 by a audio enabled telemedicine application and verified that I am speaking with the correct person using two identifiers.  Patient Location: Home  Provider Location: Home Office  I discussed the limitations of evaluation and management by telemedicine. The patient expressed understanding and agreed to proceed.  Vital Signs: Because this visit was a virtual/telehealth visit, some criteria may be missing or patient reported. Any vitals not documented were not able to be obtained and vitals that have been documented are patient reported.    Persons Participating in Visit: Patient.  AWV Questionnaire: No: Patient Medicare AWV questionnaire was not completed prior to this visit.  Cardiac Risk Factors include: advanced age (>10men, >17 women);male gender;hypertension     Objective:    Today's Vitals   08/07/24 1529  Weight: 120 lb (54.4 kg)  Height: 5' (1.524 m)   Body mass index is 23.44 kg/m.     08/07/2024    3:38 PM 08/12/2023    6:00 PM 08/11/2023   12:08 AM 08/03/2023    3:02 PM 07/08/2023    1:38 AM 07/07/2023    5:26 PM 07/28/2022    3:06 PM  Advanced Directives  Does Patient Have a Medical Advance Directive? Yes Yes Yes Yes  No Yes  Type of Estate agent of [place];Living will Healthcare Power of [employer] Power of [employer] Power of [place];Living will   Healthcare Power of [place];Living will  Does patient want to make changes to medical advance directive? No - Patient declined No - Patient declined     No - Patient declined  Copy of  Healthcare Power of Attorney in Chart? Yes - validated most recent copy scanned in chart (See row information) Yes - validated most recent copy scanned in chart (See row information) No - copy requested, Physician notified No - copy requested   No - copy requested  Would patient like information on creating a medical advance directive?     No - Patient declined      Current Medications (verified) Outpatient Encounter Medications as of 08/07/2024  Medication Sig   amiodarone  (PACERONE ) 200 MG tablet Take 1 tablet (200 mg total) by mouth daily.   amoxicillin  (AMOXIL ) 500 MG capsule TAKE FOUR CAPSULES BY MOUTH ONE hour prior TO dental procedure   Ascorbic Acid  (VITAMIN C) 1000 MG tablet Take 1,000 mg by mouth daily.   aspirin  EC 81 MG tablet Take 1 tablet (81 mg total) by mouth daily. Swallow whole.   atorvastatin  (LIPITOR ) 80 MG tablet Take 1 tablet (80 mg total) by mouth daily.   empagliflozin  (JARDIANCE ) 10 MG TABS tablet TAKE 1 TABLET BY MOUTH EVERY DAY   ezetimibe  (ZETIA ) 10 MG tablet TAKE ONE TABLET BY MOUTH DAILY   furosemide  (LASIX ) 20 MG tablet Take 1 tablet (20 mg total) by mouth daily.   guaiFENesin -dextromethorphan  (ROBITUSSIN DM) 100-10 MG/5ML syrup Take 10 mLs by mouth every 4 (four) hours as needed for cough.   lisinopril  (ZESTRIL ) 2.5 MG tablet Take 1 tablet (2.5 mg total) by mouth daily.   metoprolol  succinate (TOPROL -XL) 25 MG 24 hr tablet  Take 1 tablet (25 mg total) by mouth at bedtime.   Multiple Vitamins-Minerals (MULTIVITAMIN,TX-MINERALS) tablet Take 1 tablet by mouth daily with breakfast.   nitroGLYCERIN  (NITROSTAT ) 0.4 MG SL tablet Place 1 tablet (0.4 mg total) under the tongue every 5 (five) minutes x 3 doses as needed for chest pain.   warfarin (COUMADIN ) 4 MG tablet TAKE 1/2 TABLET BY MOUTH DAILY EXCEPT TAKE 1 TABLET ON MONDAYS, WEDNESDAYS AND FRIDAYS OR AS DIRECTED BY ANTICOAGULATION CLINIC   No facility-administered encounter medications on file as of 08/07/2024.     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 S/P Ant MI 1980;  b. 1997 S/P CABG x 8 (LIMA to diag-LAD, SVG to OM1-OM2-OM3, SVG to [facility] - Dr Tanda);  c. 01/2015 Cath: 3VD, 8/8 patent grafts.   Cardiomyopathy    Dysrhythmia    Esophageal reflux    GERD (gastroesophageal reflux disease)    Headache    History of colonoscopy    History of transesophageal echocardiography (TEE) for monitoring    Other and unspecified hyperlipidemia    Paroxysmal atrial fibrillation (HCC) 02/20/2022   S/P angioplasty with stent 02/17/22 DES to proximal VG to PDA 02/20/2022   S/P TAVR (transcatheter aortic valve replacement)    a. 03/2015 26 mm Edwards Sapien 3 transcatheter heart valve placed via open left transfemoral approach.   Severe aortic stenosis 08/10/2012   Skin lesions, generalized    facial which may represent actinic keratoses and possible photosensitivity from Amiodarone    Unspecified essential hypertension    Past Surgical History:  Procedure Laterality Date   CARDIAC CATHETERIZATION     CARDIOVERSION  11/18/2006   Dr. Dillard   CATARACT EXTRACTION W/ INTRAOCULAR LENS IMPLANT  April '13  (Dr. Rosan)   left eye only   CHOLECYSTECTOMY     COLONOSCOPY WITH PROPOFOL  N/A 07/11/2023   Procedure: COLONOSCOPY WITH PROPOFOL ;  Surgeon: Avram Lupita BRAVO, MD;  Location: [facility] ENDOSCOPY;  Service: Gastroenterology;  Laterality: N/A;   CORONARY ARTERY BYPASS GRAFT  02/09/1996   LIMA to diag-LAD, SVG to OM1-OM2-OM3, SVG to [facility]   CORONARY STENT INTERVENTION N/A 02/17/2022   Procedure: CORONARY STENT INTERVENTION;  Surgeon: Verlin Lonni BIRCH, MD;  Location: [facility] INVASIVE CV LAB;  Service:  Cardiovascular;  Laterality: N/A;   CORONARY/GRAFT ANGIOGRAPHY N/A 02/17/2022   Procedure: CORONARY/GRAFT ANGIOGRAPHY;  Surgeon: Verlin Lonni BIRCH, MD;  Location: [facility] INVASIVE CV LAB;  Service: Cardiovascular;  Laterality: N/A;   EYE SURGERY     HEMOSTASIS CLIP PLACEMENT  07/11/2023   Procedure: HEMOSTASIS CLIP PLACEMENT;  Surgeon: Avram Lupita BRAVO, MD;  Location: [facility] ENDOSCOPY;  Service: Gastroenterology;;   HEMOSTASIS CONTROL  07/11/2023   Procedure: HEMOSTASIS CONTROL;  Surgeon: Avram Lupita BRAVO, MD;  Location: [facility] ENDOSCOPY;  Service: Gastroenterology;;   LEFT AND RIGHT HEART CATHETERIZATION WITH CORONARY/GRAFT ANGIOGRAM N/A 01/26/2015   Procedure: LEFT AND RIGHT HEART CATHETERIZATION WITH EL BILE;  Surgeon: Ozell BIRCH Fell, MD;  Location: [facility] CATH LAB;  Service: Cardiovascular;  Laterality: N/A;   POLYPECTOMY  07/11/2023   Procedure: POLYPECTOMY;  Surgeon: Avram Lupita BRAVO, MD;  Location: [facility] ENDOSCOPY;  Service: Gastroenterology;;   TEE WITHOUT CARDIOVERSION N/A 03/10/2015  Procedure: TRANSESOPHAGEAL ECHOCARDIOGRAM (TEE);  Surgeon: Ozell Fell, MD;  Location: [facility] OR;  Service: Open Heart Surgery;  Laterality: N/A;   TONSILLECTOMY     TRANSCATHETER AORTIC VALVE REPLACEMENT, TRANSFEMORAL N/A 03/10/2015   Procedure: TRANSCATHETER AORTIC VALVE REPLACEMENT, TRANSFEMORAL;  Surgeon: Ozell Fell, MD;  Location: [facility] OR;  Service: Open Heart Surgery;  Laterality: N/A;   Family History  Problem Relation Age of Onset   Stroke Mother 30       Deceased   Crohn's disease Mother        Deceased   Heart attack Father 40       Deceased   Heart disease Father    Heart failure Father        Deceased   Atrial fibrillation Brother    Heart disease Paternal Uncle    Prostate cancer Paternal Uncle    Colon cancer Neg Hx    Social History   Socioeconomic History   Marital status: Married    Spouse name: Not on file   Number of children: Not on file   Years of education: Not on file    Highest education level: Not on file  Occupational History   Not on file  Tobacco Use   Smoking status: Never   Smokeless tobacco: Never   Tobacco comments:    Does not smoke.  Vaping Use   Vaping status: Never Used  Substance and Sexual Activity   Alcohol use: No    Alcohol/week: 0.0 standard drinks of alcohol   Drug use: No   Sexual activity: Not Currently  Other Topics Concern   Not on file  Social History Narrative   HSG. [employer] - [employer] 6 years. Married - '69 - 1 year/divorced. '76 - . No children. Work - mfg/textiles - Audiological scientist; currently works doing maintenance. [employer] - they have discussed this. Provided packet august '13.                Social Drivers of Corporate investment banker Strain: Low Risk  (08/07/2024)   Overall Financial Resource Strain (CARDIA)    Difficulty of Paying Living Expenses: Not hard at all  Food Insecurity: No Food Insecurity (08/07/2024)   Hunger Vital Sign    Worried About Running Out of Food in the Last Year: Never true    Ran Out of Food in the Last Year: Never true  Transportation Needs: No Transportation Needs (08/07/2024)   PRAPARE - Administrator, Civil Service (Medical): No    Lack of Transportation (Non-Medical): No  Physical Activity: Insufficiently Active (08/07/2024)   Exercise Vital Sign    Days of Exercise per Week: 3 days    Minutes of Exercise per Session: 20 min  Stress: No Stress Concern Present (08/07/2024)   [employer] of Occupational Health - Occupational Stress Questionnaire    Feeling of Stress: Not at all  Social Connections: Moderately Integrated (08/07/2024)   Social Connection and Isolation Panel    Frequency of Communication with Friends and Family: More than three times a week    Frequency of Social Gatherings with Friends and Family: More than three times a week    Attends Religious Services: More than 4 times per year    Active Member of [employer] or Organizations: Yes    Attends Tax inspector Meetings: More than 4 times per year    Marital Status: Widowed    Tobacco Counseling Counseling given: Not Answered Tobacco comments: Does not smoke.  Clinical Intake:  Pre-visit preparation completed: Yes  Pain : No/denies pain     BMI - recorded: 23.44 Nutritional Status: BMI of 19-24  Normal Nutritional Risks: None Diabetes: No  Lab Results  Component Value Date   HGBA1C 5.8 (A) 02/26/2024   HGBA1C 6.0 (H) 07/08/2023   HGBA1C 5.9 (H) 02/15/2022     How often do you need to have someone help you when you read instructions, pamphlets, or other written materials from your doctor or pharmacy?: 3 - Sometimes (Aide assist)  Interpreter Needed?: No  Information entered by :: Rojelio Blush LPN   Activities of Daily Living     08/07/2024    3:36 PM 08/11/2023    1:36 PM  In your present state of health, do you have any difficulty performing the following activities:  Hearing? 1 1  Comment Wears Hearing Aids   Vision? 0 0  Difficulty concentrating or making decisions? 0 1  Comment  forgetful  Walking or climbing stairs? 1 1  Comment Uses a Cane   Dressing or bathing? 0 0  Doing errands, shopping? 1 0  Comment Aide assist   Preparing Food and eating ? Y   Comment Aide assist   Using the Toilet? N   In the past six months, have you accidently leaked urine? N   Do you have problems with loss of bowel control? N   Managing your Medications? Y   Comment Aide assist   Managing your Finances? N   Housekeeping or managing your Housekeeping? Y   Comment Aide assist     Patient Care Team: Micheal Wolm ORN, MD as PCP - General (Family Medicine) Wonda Sharper, MD as PCP - Cardiology (Cardiology) Fernande Elspeth BROCKS, MD as PCP - Electrophysiology (Cardiology) Lionell Jon DEL, [facility] (Pharmacist)  I have updated your Care Teams any recent Medical Services you may have received from other providers in the past year.     Assessment:   This is a routine  wellness examination for Khair.  Hearing/Vision screen Hearing Screening - Comments:: Wears Hearing Aids Vision Screening - Comments:: Wears rx glasses - up to date with routine eye exams with  [facility]   Goals Addressed               This Visit's Progress     Stay active (pt-stated)         Depression Screen     08/07/2024    3:34 PM 08/03/2023    2:59 PM 09/07/2022    2:24 PM 09/02/2022   11:51 AM 07/28/2022    3:02 PM 02/23/2022    4:53 PM 06/21/2021    1:20 PM  PHQ 2/9 Scores  PHQ - 2 Score 0 0 0 0 0 0 0  PHQ- 9 Score   0 0  0     Fall Risk     08/07/2024    3:38 PM 08/03/2023    3:01 PM 09/07/2022    2:24 PM 09/02/2022   11:51 AM 07/28/2022    3:05 PM  Fall Risk   Falls in the past year? 1 1 0 0 0  Number falls in past yr: 0 0 0 0 0  Injury with Fall? 0 0 0 0 0  Risk for fall due to : No Fall Risks No Fall Risks No Fall Risks No Fall Risks No Fall Risks  Follow up Falls evaluation completed Falls prevention discussed Falls evaluation completed  Falls evaluation completed  Data saved with a previous flowsheet row definition    MEDICARE RISK AT HOME:  Medicare Risk at Home Any stairs in or around the home?: Yes If so, are there any without handrails?: No Home free of loose throw rugs in walkways, pet beds, electrical cords, etc?: Yes Adequate lighting in your home to reduce risk of falls?: Yes Life alert?: No Use of a cane, walker or w/c?: Yes Grab bars in the bathroom?: No Shower chair or bench in shower?: No Elevated toilet seat or a handicapped toilet?: No  TIMED UP AND GO:  Was the test performed?  No  Cognitive Function: 6CIT completed        08/07/2024    3:39 PM 08/03/2023    3:03 PM 07/28/2022    3:06 PM  6CIT Screen  What Year? 0 points 0 points 0 points  What month? 0 points 0 points 0 points  What time? 0 points 0 points 0 points  Count back from 20 0 points 0 points 0 points  Months in reverse 4 points 2 points 0 points   Repeat phrase 4 points 0 points 0 points  Total Score 8 points 2 points 0 points    Immunizations Immunization History  Administered Date(s) Administered   Fluad Quad(high Dose 65+) 10/04/2019, 10/08/2020, 11/01/2022   Fluad Trivalent(High Dose 65+) 09/19/2023   INFLUENZA, HIGH DOSE SEASONAL PF 08/12/2015, 09/02/2016, 08/25/2017, 09/07/2018, 08/24/2021   Influenza Whole 09/05/2001, 09/26/2008, 09/26/2009, 09/14/2010   Influenza,inj,Quad PF,6+ Mos 08/27/2014   Influenza-Unspecified 09/12/2012, 08/16/2013   Moderna Covid-19 Fall Seasonal Vaccine 82yrs & older 09/08/2022   PFIZER(Purple Top)SARS-COV-2 Vaccination 01/06/2020, 01/20/2020, 11/23/2020   Pneumococcal Conjugate-13 04/07/2014   Pneumococcal Polysaccharide-23 09/05/2001, 01/14/2010   Td 01/06/1999   Tetanus 05/15/2013   Zoster Recombinant(Shingrix) 09/06/2019, 02/19/2020   Zoster, Live 12/14/2011    Screening Tests Health Maintenance  Topic Date Due   DTaP/Tdap/Td (3 - Tdap) 05/16/2023   INFLUENZA VACCINE  07/05/2024   COVID-19 Vaccine (5 - 2025-26 season) 08/05/2024   Medicare Annual Wellness (AWV)  08/07/2025   Pneumococcal Vaccine: 50+ Years  Completed   Zoster Vaccines- Shingrix  Completed   HPV VACCINES  Aged Out   Meningococcal B Vaccine  Aged Out    Health Maintenance  Health Maintenance Due  Topic Date Due   DTaP/Tdap/Td (3 - Tdap) 05/16/2023   INFLUENZA VACCINE  07/05/2024   COVID-19 Vaccine (5 - 2025-26 season) 08/05/2024   Health Maintenance Items Addressed:   Additional Screening:  Vision Screening: Recommended annual ophthalmology exams for early detection of glaucoma and other disorders of the eye. Would you like a referral to an eye doctor? No    Dental Screening: Recommended annual dental exams for proper oral hygiene  Community Resource Referral / Chronic Care Management: CRR required this visit?  No   CCM required this visit?  No   Plan:    I have personally reviewed and noted  the following in the patient's chart:   Medical and social history Use of alcohol, tobacco or illicit drugs  Current medications and supplements including opioid prescriptions. Patient is not currently taking opioid prescriptions. Functional ability and status Nutritional status Physical activity Advanced directives List of other physicians Hospitalizations, surgeries, and ER visits in previous 12 months Vitals Screenings to include cognitive, depression, and falls Referrals and appointments  In addition, I have reviewed and discussed with patient certain preventive protocols, quality metrics, and best practice recommendations. A written personalized care plan for preventive services as well  as general preventive health recommendations were provided to patient.   Rojelio LELON Blush, LPN   [id number]   After Visit Summary: (MyChart) Due to this being a telephonic visit, the after visit summary with patients personalized plan was offered to patient via MyChart   Notes: Nothing significant to report at this time.

## 2024-08-07 NOTE — Patient Instructions (Addendum)
 [name] , Thank you for taking time out of your busy schedule to complete your Annual Wellness Visit with me. I enjoyed our conversation and look forward to speaking with you again next year. I, as well as your care team,  appreciate your ongoing commitment to your health goals. Please review the following plan we discussed and let me know if I can assist you in the future. Your Game plan/ To Do List    Referrals: If you haven't heard from the office you've been referred to, please reach out to them at the phone provided.   Follow up Visits: We will see or speak with you next year for your Next Medicare AWV with our clinical staff 08/15/25 @ 3:40p  Have you seen your provider in the last 6 months (3 months if uncontrolled diabetes)?   Clinician Recommendations:  Aim for 30 minutes of exercise or brisk walking, 6-8 glasses of water, and 5 servings of fruits and vegetables each day.       This is a list of the screenings recommended for you:  Health Maintenance  Topic Date Due   DTaP/Tdap/Td vaccine (3 - Tdap) 05/16/2023   Flu Shot  07/05/2024   COVID-19 Vaccine (5 - 2025-26 season) 08/05/2024   Medicare Annual Wellness Visit  08/07/2025   Pneumococcal Vaccine for age over 48  Completed   Zoster (Shingles) Vaccine  Completed   HPV Vaccine  Aged Out   Meningitis B Vaccine  Aged Out    Advanced directives: (In Chart) A copy of your advanced directives are scanned into your chart should your provider ever need it. Advance Care Planning is important because it:  [x]  Makes sure you receive the medical care that is consistent with your values, goals, and preferences  [x]  It provides guidance to your family and loved ones and reduces their decisional burden about whether or not they are making the right decisions based on your wishes.  Follow the link provided in your after visit summary or read over the paperwork we have mailed to you to help you started getting your Advance Directives  in place. If you need assistance in completing these, please reach out to us  so that we can help you!  See attachments for Preventive Care and Fall Prevention Tips.

## 2024-08-11 ENCOUNTER — Other Ambulatory Visit: Payer: Self-pay | Admitting: Cardiovascular Disease

## 2024-08-13 ENCOUNTER — Ambulatory Visit

## 2024-08-16 ENCOUNTER — Ambulatory Visit (INDEPENDENT_AMBULATORY_CARE_PROVIDER_SITE_OTHER)

## 2024-08-16 DIAGNOSIS — Z7901 Long term (current) use of anticoagulants: Secondary | ICD-10-CM

## 2024-08-16 LAB — POCT INR: INR: 2.6 (ref 2.0–3.0)

## 2024-08-16 NOTE — Progress Notes (Addendum)
 Continue 1 tablet daily except take 1/2 tablet on Tuesday, Thursday and Saturday. Recheck in 4 weeks.   Pt and his sitter requested BP check due to high reading with manual cuff this morning. Sitter thinks the machine needs new batteries though.  In office today; 118/58, HR 54, O2sat 98%  These are normal readings for this pt. Nothing further needed.

## 2024-08-16 NOTE — Patient Instructions (Addendum)
Pre visit review using our clinic review tool, if applicable. No additional management support is needed unless otherwise documented below in the visit note.  Continue 1 tablet daily except take 1/2 tablet on Tuesday, Thursday and Saturday. Recheck in 4 weeks.

## 2024-08-18 ENCOUNTER — Other Ambulatory Visit: Payer: Self-pay | Admitting: Family Medicine

## 2024-08-26 ENCOUNTER — Other Ambulatory Visit: Payer: Self-pay | Admitting: Family Medicine

## 2024-09-13 ENCOUNTER — Ambulatory Visit

## 2024-09-13 ENCOUNTER — Other Ambulatory Visit: Payer: Self-pay | Admitting: Family Medicine

## 2024-09-13 DIAGNOSIS — Z7901 Long term (current) use of anticoagulants: Secondary | ICD-10-CM | POA: Diagnosis not present

## 2024-09-13 LAB — POCT INR: INR: 2.2 (ref 2.0–3.0)

## 2024-09-13 NOTE — Patient Instructions (Addendum)
Pre visit review using our clinic review tool, if applicable. No additional management support is needed unless otherwise documented below in the visit note.  Continue 1 tablet daily except take 1/2 tablet on Tuesday, Thursday and Saturday. Recheck in 4 weeks.

## 2024-09-13 NOTE — Progress Notes (Signed)
 Continue 1 tablet daily except take 1/2 tablet on Tuesday, Thursday and Saturday. Recheck in 4 weeks.   Pt requests at every visit to check his BP. He also has a nurse check at home. Readings below are normal for this pt.  BP 124/52 HR 53 O2sat 97

## 2024-09-23 NOTE — Progress Notes (Signed)
 Jesus [name]                                          MRN: [id number]   09/23/2024   The [facility] Quality Team Specialist reviewed this patient medical record for the purposes of chart review for care gap closure. The following were reviewed: chart review for care gap closure-kidney health evaluation for diabetes:eGFR  and uACR.    VBCI Quality Team

## 2024-10-08 ENCOUNTER — Ambulatory Visit (INDEPENDENT_AMBULATORY_CARE_PROVIDER_SITE_OTHER)

## 2024-10-08 DIAGNOSIS — Z7901 Long term (current) use of anticoagulants: Secondary | ICD-10-CM

## 2024-10-08 LAB — POCT INR: INR: 3.2 — AB (ref 2.0–3.0)

## 2024-10-08 NOTE — Patient Instructions (Addendum)
 Pre visit review using our clinic review tool, if applicable. No additional management support is needed unless otherwise documented below in the visit note.  Reduce dose tomorrow to take 1/2 tablet and then continue 1 tablet daily except take 1/2 tablet on Tuesday, Thursday and Saturday. Recheck in 2 weeks.

## 2024-10-08 NOTE — Progress Notes (Cosign Needed Addendum)
 Pt missed dose 3 days ago and also had pineapple, which will increase INR.  Reduce dose tomorrow to take 1/2 tablet and then continue 1 tablet daily except take 1/2 tablet on Tuesday, Thursday and Saturday. Recheck in 2 weeks.  Pt requested BP check today. BP 102/4/52, HR 54; these are normal readings for this pt.   Medical screening examination/treatment/procedure(s) were performed by non-physician practitioner and as supervising physician I was immediately available for consultation/collaboration.  I agree with above. Karlynn Noel, MD

## 2024-10-21 ENCOUNTER — Encounter: Admitting: Family Medicine

## 2024-10-22 ENCOUNTER — Ambulatory Visit

## 2024-10-22 DIAGNOSIS — Z7901 Long term (current) use of anticoagulants: Secondary | ICD-10-CM | POA: Diagnosis not present

## 2024-10-22 LAB — POCT INR: INR: 2.4 (ref 2.0–3.0)

## 2024-10-22 NOTE — Patient Instructions (Addendum)
Pre visit review using our clinic review tool, if applicable. No additional management support is needed unless otherwise documented below in the visit note.  Continue 1 tablet daily except take 1/2 tablet on Tuesday, Thursday and Saturday. Recheck in 4 weeks.

## 2024-10-22 NOTE — Progress Notes (Signed)
Continue 1 tablet daily except take 1/2 tablet on Tuesday, Thursday and Saturday. Recheck in 4 weeks. 

## 2024-10-28 ENCOUNTER — Ambulatory Visit: Payer: Self-pay | Admitting: Family Medicine

## 2024-10-28 ENCOUNTER — Ambulatory Visit (INDEPENDENT_AMBULATORY_CARE_PROVIDER_SITE_OTHER): Admitting: Family Medicine

## 2024-10-28 ENCOUNTER — Encounter: Payer: Self-pay | Admitting: Family Medicine

## 2024-10-28 VITALS — BP 128/58 | HR 96 | Temp 97.4°F | Ht 59.84 in | Wt 127.0 lb

## 2024-10-28 DIAGNOSIS — R7303 Prediabetes: Secondary | ICD-10-CM | POA: Diagnosis not present

## 2024-10-28 DIAGNOSIS — Z79899 Other long term (current) drug therapy: Secondary | ICD-10-CM

## 2024-10-28 DIAGNOSIS — I4819 Other persistent atrial fibrillation: Secondary | ICD-10-CM

## 2024-10-28 DIAGNOSIS — E782 Mixed hyperlipidemia: Secondary | ICD-10-CM

## 2024-10-28 DIAGNOSIS — Z23 Encounter for immunization: Secondary | ICD-10-CM

## 2024-10-28 LAB — COMPREHENSIVE METABOLIC PANEL WITH GFR
ALT: 67 U/L — ABNORMAL HIGH (ref 0–53)
AST: 70 U/L — ABNORMAL HIGH (ref 0–37)
Albumin: 4 g/dL (ref 3.5–5.2)
Alkaline Phosphatase: 116 U/L (ref 39–117)
BUN: 28 mg/dL — ABNORMAL HIGH (ref 6–23)
CO2: 32 meq/L (ref 19–32)
Calcium: 9.5 mg/dL (ref 8.4–10.5)
Chloride: 102 meq/L (ref 96–112)
Creatinine, Ser: 1.46 mg/dL (ref 0.40–1.50)
GFR: 43.51 mL/min — ABNORMAL LOW (ref >60.00)
Glucose, Bld: 96 mg/dL (ref 70–99)
Potassium: 4.6 meq/L (ref 3.5–5.1)
Sodium: 140 meq/L (ref 135–145)
Total Bilirubin: 0.9 mg/dL (ref 0.2–1.2)
Total Protein: 7.1 g/dL (ref 6.0–8.3)

## 2024-10-28 LAB — CBC WITH DIFFERENTIAL/PLATELET
Basophils Absolute: 0.1 K/uL (ref 0.0–0.1)
Basophils Relative: 1 % (ref 0.0–3.0)
Eosinophils Absolute: 0.1 K/uL (ref 0.0–0.7)
Eosinophils Relative: 2.1 % (ref 0.0–5.0)
HCT: 39.9 % (ref 39.0–52.0)
Hemoglobin: 13.2 g/dL (ref 13.0–17.0)
Lymphocytes Relative: 18.6 % (ref 12.0–46.0)
Lymphs Abs: 1.2 K/uL (ref 0.7–4.0)
MCHC: 33.1 g/dL (ref 30.0–36.0)
MCV: 102.3 fl — ABNORMAL HIGH (ref 78.0–100.0)
Monocytes Absolute: 0.8 K/uL (ref 0.1–1.0)
Monocytes Relative: 12.1 % — ABNORMAL HIGH (ref 3.0–12.0)
Neutro Abs: 4.1 K/uL (ref 1.4–7.7)
Neutrophils Relative %: 66.2 % (ref 43.0–77.0)
Platelets: 138 K/uL — ABNORMAL LOW (ref 150.0–400.0)
RBC: 3.9 Mil/uL — ABNORMAL LOW (ref 4.22–5.81)
RDW: 14 % (ref 11.5–15.5)
WBC: 6.2 K/uL (ref 4.0–10.5)

## 2024-10-28 LAB — TSH: TSH: 4.93 u[IU]/mL (ref 0.35–5.50)

## 2024-10-28 LAB — LIPID PANEL
Cholesterol: 134 mg/dL (ref 0–200)
HDL: 43.6 mg/dL (ref >39.00)
LDL Cholesterol: 70 mg/dL (ref 0–99)
NonHDL: 90.14
Total CHOL/HDL Ratio: 3
Triglycerides: 102 mg/dL (ref 0.0–149.0)
VLDL: 20.4 mg/dL (ref 0.0–40.0)

## 2024-10-28 LAB — HEMOGLOBIN A1C: Hgb A1c MFr Bld: 5.9 % (ref 4.6–6.5)

## 2024-10-28 MED ORDER — ATORVASTATIN CALCIUM 80 MG PO TABS: 80.0000 mg | ORAL_TABLET | Freq: Every day | ORAL | 3 refills | Status: AC

## 2024-10-28 NOTE — Progress Notes (Signed)
 Established Patient Office Visit  Subjective   Patient ID: Jesus [name], male    DOB: Sep 22, 1941  Age: 83 y.o. MRN: [id number]  Chief Complaint  Patient presents with   Annual Exam    HPI   [name] is here for routine medical follow-up.  Accompanied by caregiver today.  He no longer drives.  [facility] has been [facility] most days of the week from 9-12.  He has multiple medical problems including history of ischemic cardiomyopathy, hypertension, atrial fibrillation, aortic stenosis, prediabetes.  Has been reasonably stable this past year.  They weigh daily at home and weights are consistently at home 121-123.  Denies any dyspnea with day-to-day activities.  No recent falls.  Ambulates with a cane.  No orthopnea.  Still needs flu vaccine.  Pneumonia vaccines up-to-date.  No reported history of RSV vaccine.  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 S/P Ant MI 1980;  b. 1997 S/P CABG x 8 (LIMA to diag-LAD, SVG to OM1-OM2-OM3, SVG to [facility] - Dr Tanda);  c. 01/2015 Cath: 3VD, 8/8 patent grafts.   Cardiomyopathy    Dysrhythmia    Esophageal reflux    GERD (gastroesophageal reflux disease)    Headache    History of colonoscopy    History of transesophageal echocardiography (TEE) for monitoring    Other and unspecified hyperlipidemia    Paroxysmal atrial fibrillation (HCC) 02/20/2022   S/P angioplasty with stent 02/17/22 DES to proximal VG to PDA 02/20/2022   S/P TAVR (transcatheter aortic valve replacement)    a. 03/2015 26 mm Edwards Sapien 3 transcatheter heart valve placed via open left transfemoral approach.   Severe aortic stenosis 08/10/2012   Skin lesions, generalized    facial which may represent actinic keratoses and possible  photosensitivity from Amiodarone    Unspecified essential hypertension    Past Surgical History:  Procedure Laterality Date   CARDIAC CATHETERIZATION     CARDIOVERSION  11/18/2006   Dr. Dillard   CATARACT EXTRACTION W/ INTRAOCULAR LENS IMPLANT  April '13  (Dr. Rosan)   left eye only   CHOLECYSTECTOMY     COLONOSCOPY WITH PROPOFOL  N/A 07/11/2023   Procedure: COLONOSCOPY WITH PROPOFOL ;  Surgeon: Avram Lupita BRAVO, MD;  Location: [facility] ENDOSCOPY;  Service: Gastroenterology;  Laterality: N/A;   CORONARY ARTERY BYPASS GRAFT  02/09/1996   LIMA to diag-LAD, SVG to OM1-OM2-OM3, SVG to [facility]   CORONARY STENT INTERVENTION N/A 02/17/2022   Procedure: CORONARY STENT INTERVENTION;  Surgeon: Verlin Lonni BIRCH, MD;  Location: [facility] INVASIVE CV LAB;  Service: Cardiovascular;  Laterality: N/A;   CORONARY/GRAFT ANGIOGRAPHY N/A 02/17/2022   Procedure: CORONARY/GRAFT ANGIOGRAPHY;  Surgeon: Verlin Lonni BIRCH, MD;  Location: [facility] INVASIVE CV LAB;  Service: Cardiovascular;  Laterality: N/A;   EYE SURGERY     HEMOSTASIS CLIP PLACEMENT  07/11/2023   Procedure: HEMOSTASIS CLIP PLACEMENT;  Surgeon: Avram Lupita BRAVO, MD;  Location: [facility] ENDOSCOPY;  Service: Gastroenterology;;   HEMOSTASIS CONTROL  07/11/2023   Procedure: HEMOSTASIS CONTROL;  Surgeon: Avram Lupita BRAVO, MD;  Location: [facility] ENDOSCOPY;  Service: Gastroenterology;;   LEFT AND RIGHT HEART CATHETERIZATION WITH CORONARY/GRAFT ANGIOGRAM N/A 01/26/2015   Procedure: LEFT AND RIGHT HEART CATHETERIZATION WITH CORONARY/GRAFT ANGIOGRAM;  Surgeon:  Ozell JONETTA Fell, MD;  Location: [facility] CATH LAB;  Service: Cardiovascular;  Laterality: N/A;   POLYPECTOMY  07/11/2023   Procedure: POLYPECTOMY;  Surgeon: Avram Lupita BRAVO, MD;  Location: [facility] ENDOSCOPY;  Service: Gastroenterology;;   TEE WITHOUT CARDIOVERSION N/A 03/10/2015   Procedure: TRANSESOPHAGEAL ECHOCARDIOGRAM (TEE);  Surgeon: Ozell Fell, MD;  Location: [facility] OR;  Service: Open Heart Surgery;  Laterality: N/A;    TONSILLECTOMY     TRANSCATHETER AORTIC VALVE REPLACEMENT, TRANSFEMORAL N/A 03/10/2015   Procedure: TRANSCATHETER AORTIC VALVE REPLACEMENT, TRANSFEMORAL;  Surgeon: Ozell Fell, MD;  Location: [facility] OR;  Service: Open Heart Surgery;  Laterality: N/A;    reports that he has never smoked. He has never used smokeless tobacco. He reports that he does not drink alcohol and does not use drugs. family history includes Atrial fibrillation in his brother; Crohn's disease in his mother; Heart attack (age of onset: 60) in his father; Heart disease in his father and paternal uncle; Heart failure in his father; Prostate cancer in his paternal uncle; Stroke (age of onset: 109) in his mother. Allergies  Allergen Reactions   Iodine  Swelling and Other (See Comments)    Skin swells- CAN DRINK BOOST AND ENSURE WITH NO ISSUES   Amlodipine  Swelling and Other (See Comments)    Swelling?   Codeine Other (See Comments)    Reaction not recalled    Review of Systems  Constitutional:  Negative for malaise/fatigue.  Eyes:  Negative for blurred vision.  Respiratory:  Negative for shortness of breath.   Cardiovascular:  Negative for chest pain.  Gastrointestinal:  Negative for abdominal pain.  Neurological:  Negative for dizziness, weakness and headaches.      Objective:     BP (!) 128/58 (BP Location: Left Arm, Cuff Size: Normal)   Pulse 96   Temp (!) 97.4 F (36.3 C) (Oral)   Ht 4' 11.84 (1.52 m)   Wt 127 lb (57.6 kg)   SpO2 96%   BMI 24.93 kg/m  BP Readings from Last 3 Encounters:  10/28/24 (!) 128/58  06/04/24 (!) 128/58  04/08/24 (!) 134/52   Wt Readings from Last 3 Encounters:  10/28/24 127 lb (57.6 kg)  08/07/24 120 lb (54.4 kg)  04/08/24 129 lb 6.4 oz (58.7 kg)      Physical Exam Vitals reviewed.  Constitutional:      Appearance: He is well-developed.  HENT:     Right Ear: External ear normal.     Left Ear: External ear normal.  Eyes:     Pupils: Pupils are equal, round, and reactive  to light.  Neck:     Thyroid : No thyromegaly.  Cardiovascular:     Rate and Rhythm: Normal rate.  Pulmonary:     Effort: Pulmonary effort is normal. No respiratory distress.     Breath sounds: No wheezing.     Comments: Has some crackles in both lung bases Musculoskeletal:     Cervical back: Neck supple.     Right lower leg: No edema.     Left lower leg: No edema.  Neurological:     Mental Status: He is alert and oriented to person, place, and time.      No results found for any visits on 10/28/24.  Last CBC Lab Results  Component Value Date   WBC 8.3 08/14/2023   HGB 12.8 (L) 08/14/2023   HCT 39.1 08/14/2023   MCV 98.2 08/14/2023   MCH 32.2 08/14/2023   RDW 14.6 08/14/2023   PLT 184 08/14/2023  Last metabolic panel Lab Results  Component Value Date   GLUCOSE 96 02/02/2024   NA 144 02/02/2024   K 4.8 02/02/2024   CL 107 (H) 02/02/2024   CO2 24 02/02/2024   BUN 22 02/02/2024   CREATININE 1.27 02/02/2024   EGFR 56 (L) 02/02/2024   CALCIUM  9.1 02/02/2024   PHOS 4.1 07/09/2023   PROT 6.8 08/11/2023   ALBUMIN  3.3 (L) 08/11/2023   LABGLOB 2.4 06/05/2023   AGRATIO 1.4 10/19/2022   BILITOT 1.3 (H) 08/11/2023   ALKPHOS 97 08/11/2023   AST 34 08/11/2023   ALT 46 (H) 08/11/2023   ANIONGAP 14 08/14/2023   Last lipids Lab Results  Component Value Date   CHOL 135 06/05/2023   HDL 50 06/05/2023   LDLCALC 68 06/05/2023   TRIG 90 06/05/2023   CHOLHDL 2.7 06/05/2023   Last hemoglobin A1c Lab Results  Component Value Date   HGBA1C 5.8 (A) 02/26/2024   Last thyroid  functions Lab Results  Component Value Date   TSH 4.650 (H) 10/10/2023   T4TOTAL 8.5 10/19/2009   FREET4 1.20 06/15/2023      The ASCVD Risk score (Arnett DK, et al., 2019) failed to calculate for the following reasons:   The 2019 ASCVD risk score is only valid for ages 71 to 40   Risk score cannot be calculated because patient has a medical history suggesting prior/existing ASCVD     Assessment & Plan:   #1 history of atrial fibrillation.  Rate control.  Patient on chronic Coumadin .  Followed closely by Coumadin  clinic.  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  Home weight stable.  He has a few crackles in both lung bases.  No peripheral edema and no evidence for significant volume overload otherwise.  Has been very stable in the past year on regimen above.  Continue low-sodium diet.  Continue daily weights  #3 hyperlipidemia.  Patient on high-dose statin with atorvastatin  80 mg daily and also takes Zetia .  Refill Lipitor  for 1 year.  Check fasting lipid and CMP  #4 history of prediabetes.  Recheck A1c  #5 high risk medication use with amiodarone .  Past history of mildly elevated TSH.  Recheck thyroid  today  #6 health maintenance.  Recommend flu vaccine and patient consents.  We also recommend he consider RSV vaccine at local pharmacy   Return in about 6 months (around 04/27/2025).    Wolm Scarlet, MD

## 2024-10-28 NOTE — Patient Instructions (Signed)
 Consider RSV vaccine at some point this year.

## 2024-11-19 ENCOUNTER — Ambulatory Visit

## 2024-11-19 DIAGNOSIS — Z7901 Long term (current) use of anticoagulants: Secondary | ICD-10-CM

## 2024-11-19 LAB — POCT INR: INR: 3.1 — AB (ref 2.0–3.0)

## 2024-11-19 NOTE — Patient Instructions (Addendum)
 Pre visit review using our clinic review tool, if applicable. No additional management support is needed unless otherwise documented below in the visit note.  Eat some collard greens tonight and then continue 1 tablet daily except take 1/2 tablet on Tuesday, Thursday and Saturday. Recheck in 3 weeks.

## 2024-11-19 NOTE — Progress Notes (Signed)
 Indication: Afib Eat some collard greens tonight and then continue 1 tablet daily except take 1/2 tablet on Tuesday, Thursday and Saturday. Recheck in 3 weeks.  Pt always request BP check to assure his home cuff is correct.  BP 122/56, HR 63, O2 sat 97%; these are normal readings for this pt

## 2024-12-06 ENCOUNTER — Other Ambulatory Visit: Payer: Self-pay | Admitting: Family Medicine

## 2024-12-06 DIAGNOSIS — Z7901 Long term (current) use of anticoagulants: Secondary | ICD-10-CM

## 2024-12-09 NOTE — Telephone Encounter (Signed)
 Pt is compliant with warfarin management and PCP apts.  Sent in refill of warfarin to requested pharmacy.

## 2024-12-10 ENCOUNTER — Ambulatory Visit

## 2024-12-10 DIAGNOSIS — Z7901 Long term (current) use of anticoagulants: Secondary | ICD-10-CM | POA: Diagnosis not present

## 2024-12-10 LAB — POCT INR: INR: 3.4 — AB (ref 2.0–3.0)

## 2024-12-10 NOTE — Patient Instructions (Addendum)
 Pre visit review using our clinic review tool, if applicable. No additional management support is needed unless otherwise documented below in the visit note.  Reduce dose tomorrow to take 1/2 tablet and then continue 1 tablet daily except take 1/2 tablet on Tuesday, Thursday and Saturday. Recheck in 2 weeks.

## 2024-12-10 NOTE — Progress Notes (Signed)
 Indication: Afib Pt missed all of his nightly medications over the weekend but did take his warfarin in the morning. Pt normally takes it in the morning. Pt missed warfarin yesterday morning so his caregiver advised him to take it last night. This could explain the supratherapeutic INR today. Due to these factors there will not be a change in weekly dosing and will recheck in 2 weeks.  Pt already took warfarin today. Reduce dose tomorrow to take 1/2 tablet and then continue 1 tablet daily except take 1/2 tablet on Tuesday, Thursday and Saturday. Recheck in 2 weeks.

## 2024-12-24 ENCOUNTER — Ambulatory Visit

## 2024-12-24 DIAGNOSIS — Z7901 Long term (current) use of anticoagulants: Secondary | ICD-10-CM

## 2024-12-24 LAB — POCT INR: INR: 3.8 — AB (ref 2.0–3.0)

## 2024-12-24 NOTE — Patient Instructions (Addendum)
 Pre visit review using our clinic review tool, if applicable. No additional management support is needed unless otherwise documented below in the visit note.  Hold warfarin dose tomorrow and then change weekly dosing to take 1/2 tablet daily except take 1 tablet on Monday, Wednesday and Friday. Recheck in 2 weeks.

## 2024-12-24 NOTE — Progress Notes (Signed)
.   Indication: Afib Pt missed a dose 2 days ago. Pt denies any other changes.  Pt already took warfarin today. Hold warfarin dose tomorrow and then change weekly dosing to take 1/2 tablet daily except take 1 tablet on Monday, Wednesday and Friday. Recheck in 2 weeks.

## 2025-01-09 ENCOUNTER — Telehealth: Payer: Self-pay

## 2025-01-09 NOTE — Telephone Encounter (Signed)
 Erin contacted coumadin  clinic requesting to RS the pt's coumadin  clinic apt from tomorrow to next week. She is concerned about the condition of the roads due to the recent weather.   RS apt for next week. Erin verbalized understanding.

## 2025-01-10 ENCOUNTER — Ambulatory Visit

## 2025-01-14 ENCOUNTER — Ambulatory Visit

## 2025-08-15 ENCOUNTER — Ambulatory Visit
